# Patient Record
Sex: Female | Born: 1945
Health system: Southern US, Community
[De-identification: ages and names within clinical notes are randomized; demographics above are authoritative.]

## PROBLEM LIST (undated history)

## (undated) DIAGNOSIS — K7460 Unspecified cirrhosis of liver: Secondary | ICD-10-CM

## (undated) DIAGNOSIS — R601 Generalized edema: Secondary | ICD-10-CM

## (undated) DIAGNOSIS — J45909 Unspecified asthma, uncomplicated: Secondary | ICD-10-CM

## (undated) DIAGNOSIS — K7200 Acute and subacute hepatic failure without coma: Secondary | ICD-10-CM

## (undated) DIAGNOSIS — C52 Malignant neoplasm of vagina: Secondary | ICD-10-CM

## (undated) DIAGNOSIS — K922 Gastrointestinal hemorrhage, unspecified: Secondary | ICD-10-CM

## (undated) DIAGNOSIS — F32A Depression, unspecified: Secondary | ICD-10-CM

## (undated) DIAGNOSIS — R188 Other ascites: Secondary | ICD-10-CM

## (undated) DIAGNOSIS — T7840XA Allergy, unspecified, initial encounter: Secondary | ICD-10-CM

## (undated) DIAGNOSIS — I10 Essential (primary) hypertension: Secondary | ICD-10-CM

## (undated) DIAGNOSIS — D696 Thrombocytopenia, unspecified: Secondary | ICD-10-CM

## (undated) DIAGNOSIS — K921 Melena: Secondary | ICD-10-CM

## (undated) DIAGNOSIS — F419 Anxiety disorder, unspecified: Secondary | ICD-10-CM

## (undated) DIAGNOSIS — E785 Hyperlipidemia, unspecified: Secondary | ICD-10-CM

## (undated) DIAGNOSIS — L309 Dermatitis, unspecified: Secondary | ICD-10-CM

## (undated) DIAGNOSIS — E119 Type 2 diabetes mellitus without complications: Secondary | ICD-10-CM

## (undated) DIAGNOSIS — M1990 Unspecified osteoarthritis, unspecified site: Secondary | ICD-10-CM

## (undated) DIAGNOSIS — F329 Major depressive disorder, single episode, unspecified: Secondary | ICD-10-CM

## (undated) DIAGNOSIS — R51 Headache: Secondary | ICD-10-CM

## (undated) DIAGNOSIS — E041 Nontoxic single thyroid nodule: Secondary | ICD-10-CM

## (undated) DIAGNOSIS — M6281 Muscle weakness (generalized): Secondary | ICD-10-CM

## (undated) DIAGNOSIS — C541 Malignant neoplasm of endometrium: Secondary | ICD-10-CM

## (undated) DIAGNOSIS — R569 Unspecified convulsions: Secondary | ICD-10-CM

## (undated) HISTORY — DX: Malignant neoplasm of endometrium: C54.1

## (undated) HISTORY — DX: Type 2 diabetes mellitus without complications: E11.9

## (undated) HISTORY — DX: Nontoxic single thyroid nodule: E04.1

## (undated) HISTORY — DX: Allergy, unspecified, initial encounter: T78.40XA

## (undated) HISTORY — DX: Hyperlipidemia, unspecified: E78.5

## (undated) HISTORY — DX: Anxiety disorder, unspecified: F41.9

## (undated) HISTORY — PX: OTHER SURGICAL HISTORY: SHX169

## (undated) HISTORY — DX: Malignant neoplasm of vagina: C52

## (undated) HISTORY — DX: Essential (primary) hypertension: I10

## (undated) HISTORY — PX: DILATION AND CURETTAGE OF UTERUS: SHX78

## (undated) SURGERY — ESOPHAGOGASTRODUODENOSCOPY (EGD) WITH PROPOFOL
Anesthesia: Monitor Anesthesia Care

---
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
## 1994-03-23 HISTORY — PX: TOTAL ABDOMINAL HYSTERECTOMY: SHX209

## 2003-03-24 DIAGNOSIS — C541 Malignant neoplasm of endometrium: Secondary | ICD-10-CM

## 2003-03-24 HISTORY — DX: Malignant neoplasm of endometrium: C54.1

## 2007-03-24 DIAGNOSIS — K921 Melena: Secondary | ICD-10-CM

## 2007-03-24 HISTORY — DX: Melena: K92.1

## 2007-06-22 HISTORY — PX: COLONOSCOPY: SHX174

## 2008-03-29 ENCOUNTER — Encounter: Payer: Self-pay | Admitting: Gynecologic Oncology

## 2008-03-29 ENCOUNTER — Other Ambulatory Visit: Admission: RE | Admit: 2008-03-29 | Discharge: 2008-03-29 | Payer: Self-pay | Admitting: Gynecologic Oncology

## 2008-03-29 ENCOUNTER — Ambulatory Visit: Admission: RE | Admit: 2008-03-29 | Discharge: 2008-03-29 | Payer: Self-pay | Admitting: Gynecologic Oncology

## 2008-08-06 ENCOUNTER — Ambulatory Visit (HOSPITAL_COMMUNITY): Admission: RE | Admit: 2008-08-06 | Discharge: 2008-08-06 | Payer: Self-pay | Admitting: Family Medicine

## 2008-09-18 ENCOUNTER — Ambulatory Visit: Admission: RE | Admit: 2008-09-18 | Discharge: 2008-09-18 | Payer: Self-pay | Admitting: Gynecologic Oncology

## 2008-09-18 ENCOUNTER — Other Ambulatory Visit: Admission: RE | Admit: 2008-09-18 | Discharge: 2008-09-18 | Payer: Self-pay | Admitting: Gynecologic Oncology

## 2008-09-18 ENCOUNTER — Encounter: Payer: Self-pay | Admitting: Gynecologic Oncology

## 2009-11-11 ENCOUNTER — Ambulatory Visit (HOSPITAL_COMMUNITY): Admission: RE | Admit: 2009-11-11 | Discharge: 2009-11-11 | Payer: Self-pay | Admitting: Obstetrics & Gynecology

## 2010-08-05 NOTE — Consult Note (Signed)
Brittany Russo, Brittany Russo                ACCOUNT NO.:  [id number]   MEDICAL RECORD NO.:  [id number]          PATIENT TYPE:  OUT   LOCATION:  GYN                          FACILITY:  [facility]   PHYSICIAN:  Janie Morning, MD     DATE OF BIRTH:  1945-06-14   DATE OF CONSULTATION:  03/29/2008  DATE OF DISCHARGE:                                 CONSULTATION   REASON FOR CONSULTATION:  Followup for recurrent endometrial cancer.   HISTORY OF PRESENT ILLNESS:  This is a 65 year old who in 1966 underwent  a hysterectomy, bilateral salpingo-oophorectomy for presumed  endometriosis.  However, the pathology report was noted to have an area  consistent of grade 1 endometrial cancer.  The patient presented 6 years  later with vaginal bleeding and at that time recurrence was identified.  She was treated with syad implants and that brachytherapy has been  associated with rectal urgency, vaginal atrophy and occasional Pap  smears with changes consistent with radiation changes.  There has been  no recurrence since.   PAST MEDICAL HISTORY:  Hypertension, hypercholesterolemia, diabetes  mellitus, intermittent depression, colitis.   PAST SURGICAL HISTORY:  As noted above.  In addition, benign thyroid  nodule in 2005.   SOCIAL HISTORY:  Brittany Russo has 4 children who are alive and well.  She  denies any tobacco use.  She is married and recently relocated from [place]  to [place], [place].   SCREENING HISTORY:  Mammogram February 2009 within normal limits.  Pap  April 2009 within normal limits.  Colonoscopy February 2009 within  normal limits.   REVIEW OF SYSTEMS:  Notable for rectal urgency.  No nausea or vomiting.  There has been recent weight gain.  She reports occasional vaginal  spotting.   PHYSICAL EXAMINATION:  GENERAL:  A well-developed, obese female in no  acute distress.  VITAL SIGNS:  Weight 191 pounds, height 5 feet 2 inches, blood pressure  159/85, pulse of 76.  CHEST:  Clear to  auscultation.  LYMPH NODE SURVEY:  No axillary, cervical or inguinal lymphadenopathy.  BACK:  No CVA tenderness.  ABDOMEN:  Soft, obese, nontender.  Well-healed Pfannenstiel incision is  present.  PELVIC:  Normal external genitalia, Bartholin's, urethra and Skene's.  The vagina was notably atrophic.  No gross lesions were appreciated.  There were some filmy adhesions in the vaginal apex.  RECTAL:  Notable for external hemorrhoids.   IMPRESSION:  Recurrent endometrial cancer.  Ms. Dargan has been without  evidence of disease for almost 5 years.  She informs me in 2006 per Dr.  Con Memos advice she did not pursue any additional CT scan imaging for  surveillance.  She does report intermittent chest x-rays.  At this time  the plan is to repeat her Pap test in 6 months as was per her routine  with Dr. Janey Greaser.      Janie Morning, MD  Electronically Signed     WB/MEDQ  D:  03/29/2008  T:  03/29/2008  Job:  [id number]   cc:   [place]  [address]  [address]  [place]  [address]   Caswell Corwin, [place], [place] [address]   [facility] [place]  Fax: [phone]

## 2010-08-05 NOTE — Consult Note (Signed)
Brittany Russo, Brittany Russo                ACCOUNT NO.:  [id number]   MEDICAL RECORD NO.:  [id number]          PATIENT TYPE:  OUT   LOCATION:  GYN                          [facility]ACILITY[facility]  [facility]   PHYSICIAN:  Janie Morning, MD     DATE OF BIRTH:  08/30/1945   DATE OF CONSULTATION:  09/18/2008  DATE OF DISCHARGE:                                 CONSULTATION   REASON FOR VISIT:  Surveillance for endometrial cancer.   HISTORY OF PRESENT ILLNESS:  This is a 65 year old who underwent a total  abdominal hysterectomy, bilateral salpingo-oophorectomy for  endometriosis in 1996.  The pathology report, however, was consistent  with grade I endometrial cancer.  She presented in 2003 with vaginal  bleeding, and at that time recurrence was noted at the vaginal cuff.  She was treated with Hart Robinsons.  She was treated with [facility] implants  and external beam radiation therapy.  Treatment was completed in March  of 2004.  Since that time, she has had periodic issues with radiation  proctitis, intermittent diarrhea, occasional vaginal bleeding.  She also  reports a history of rectal urgency, vaginal atrophy, and there is a  history of occasional Pap smear changes consistent with radiation  changes, and no evidence of disease since her definitive treatment.   PAST MEDICAL HISTORY:  1. Hypertension.  2. Hypercholesterolemia.  3. Diabetes mellitus.  4. Intermittent depression.  5. Radiation colitis.   PAST SURGICAL HISTORY:  As noted above.   SOCIAL HISTORY:  Brittany Russo has 4 children who are alive and well.  She  is married and relocated from [place] to [place], [place].   SCREENING HISTORY:  A mammogram in February of 2009 was within normal  limits.  No DEXA scan for several years.  Pap on March 29, 2008, which  was negative.  A colonoscopy in February of 2009 within normal limits.   REVIEW OF SYSTEMS:  Rectal urgency.  No nausea or vomiting.  Occasional  loss of urine.  Occasional vaginal spotting  associated with the use of a  dilator.  Occasional diarrhea.  Otherwise, review of systems is  negative.   PHYSICAL EXAMINATION:  VITAL SIGNS:  Weight 188 pounds, blood pressure  132/60.  CHEST:  Clear to auscultation.  BACK:  No CVA tenderness.  LYMPH NODE SURVEY:  No axillary, cervical or inguinal lymphadenopathy.  ABDOMEN:  Soft, obese, nontender.  A Pfannenstiel incision is noted.  PELVIC:  Normal external genitalia, Bartholin's gland, Skene's gland,  and urethra.  The vagina is atrophic and friable with distention of the  pelvis.  RECTAL:  External hemorrhoids.  No masses.   IMPRESSION:  This is a 65 year old who is no evidence of disease (NED)  from her recurrent endometrial cancer.  She is doing very well, and we  had a discussion about transferring her care to that of well woman care.  For that, I have highly suggested that she follow up with Dr. Lahoma Crocker on an annual basis with the first visit next June.  The  patient agrees with this plan.  Contact information was provided.  Janie Morning, MD  Electronically Signed     WB/MEDQ  D:  09/18/2008  T:  09/18/2008  Job:  [id number]   cc:   Caswell Corwin, R.N.  [address]  [place], [place] [address]   Agnes Lawrence, M.D.  Fax: [phone]

## 2013-01-26 ENCOUNTER — Encounter: Payer: Self-pay | Admitting: Obstetrics & Gynecology

## 2013-03-06 ENCOUNTER — Ambulatory Visit (INDEPENDENT_AMBULATORY_CARE_PROVIDER_SITE_OTHER): Payer: Medicare Other | Admitting: Obstetrics & Gynecology

## 2013-03-06 ENCOUNTER — Encounter: Payer: Self-pay | Admitting: Obstetrics & Gynecology

## 2013-03-06 VITALS — BP 120/72 | HR 61 | Temp 98.0°F | Ht 62.0 in | Wt 180.0 lb

## 2013-03-06 DIAGNOSIS — R82998 Other abnormal findings in urine: Secondary | ICD-10-CM | POA: Diagnosis not present

## 2013-03-06 DIAGNOSIS — Z01419 Encounter for gynecological examination (general) (routine) without abnormal findings: Secondary | ICD-10-CM

## 2013-03-06 DIAGNOSIS — Z0000 Encounter for general adult medical examination without abnormal findings: Secondary | ICD-10-CM

## 2013-03-06 DIAGNOSIS — Z124 Encounter for screening for malignant neoplasm of cervix: Secondary | ICD-10-CM

## 2013-03-06 DIAGNOSIS — R8290 Unspecified abnormal findings in urine: Secondary | ICD-10-CM

## 2013-03-06 LAB — POCT URINALYSIS DIPSTICK
Bilirubin, UA: NEGATIVE
Glucose, UA: NEGATIVE
Ketones, UA: NEGATIVE
Nitrite, UA: NEGATIVE
Protein, UA: NEGATIVE
Spec Grav, UA: <=1.005
Urobilinogen, UA: NEGATIVE
pH, UA: 6.5

## 2013-03-06 NOTE — Progress Notes (Signed)
Subjective:     Brittany Russo is a 67 y.o. female here for a routine exam.  Current complaints: pain in pelvic area, reports lump in right breast, mammogram scheduled for tomorrow.  Personal health questionnaire reviewed: yes.   Gynecologic History No LMP recorded. Patient is postmenopausal. Contraception: post menopausal status Last Pap:2012. Results were: normal Last mammogram: 03/2010. Results were: abnormal, needs follow up  Obstetric History OB History  No data available     The following portions of the patient's history were reviewed and updated as appropriate: allergies, current medications, past family history, past medical history, past social history, past surgical history and problem list.  Review of Systems Pertinent items are noted in HPI.    Objective:      General appearance: alert Breasts: normal appearance, no masses or tenderness Abdomen: soft, non-tender; bowel sounds normal; no masses,  no organomegaly Pelvic: cervix normal in appearance, external genitalia normal, no adnexal masses or tenderness, uterus normal size, shape, and consistency and vagina normal without discharge       Assessment:    Healthy female exam.    Plan:   Return prn

## 2013-03-07 DIAGNOSIS — R928 Other abnormal and inconclusive findings on diagnostic imaging of breast: Secondary | ICD-10-CM | POA: Diagnosis not present

## 2013-03-07 LAB — URINE CULTURE
Colony Count: NO GROWTH
Organism ID, Bacteria: NO GROWTH

## 2013-03-07 NOTE — Patient Instructions (Signed)

## 2013-03-09 LAB — PAP IG AND HPV HIGH-RISK: HPV DNA High Risk: NOT DETECTED

## 2013-03-24 ENCOUNTER — Other Ambulatory Visit: Payer: Self-pay | Admitting: *Deleted

## 2013-03-24 DIAGNOSIS — N72 Inflammatory disease of cervix uteri: Secondary | ICD-10-CM

## 2013-03-30 ENCOUNTER — Other Ambulatory Visit: Payer: Self-pay | Admitting: *Deleted

## 2013-03-30 DIAGNOSIS — N72 Inflammatory disease of cervix uteri: Secondary | ICD-10-CM

## 2013-03-30 MED ORDER — DOXYCYCLINE HYCLATE 50 MG PO CAPS: 100.0000 mg | ORAL_CAPSULE | Freq: Two times a day (BID) | ORAL | Status: DC

## 2013-04-13 DIAGNOSIS — IMO0001 Reserved for inherently not codable concepts without codable children: Secondary | ICD-10-CM | POA: Diagnosis not present

## 2013-04-13 DIAGNOSIS — J019 Acute sinusitis, unspecified: Secondary | ICD-10-CM | POA: Diagnosis not present

## 2013-04-13 DIAGNOSIS — I10 Essential (primary) hypertension: Secondary | ICD-10-CM | POA: Diagnosis not present

## 2013-04-13 DIAGNOSIS — E782 Mixed hyperlipidemia: Secondary | ICD-10-CM | POA: Diagnosis not present

## 2013-04-13 DIAGNOSIS — Z1331 Encounter for screening for depression: Secondary | ICD-10-CM | POA: Diagnosis not present

## 2013-05-04 DIAGNOSIS — IMO0001 Reserved for inherently not codable concepts without codable children: Secondary | ICD-10-CM | POA: Diagnosis not present

## 2013-05-04 DIAGNOSIS — E876 Hypokalemia: Secondary | ICD-10-CM | POA: Diagnosis not present

## 2013-06-06 ENCOUNTER — Encounter: Payer: Self-pay | Admitting: Obstetrics & Gynecology

## 2013-06-29 DIAGNOSIS — H8309 Labyrinthitis, unspecified ear: Secondary | ICD-10-CM | POA: Diagnosis not present

## 2013-07-10 ENCOUNTER — Ambulatory Visit: Payer: Medicare Other | Admitting: Obstetrics & Gynecology

## 2013-07-20 DIAGNOSIS — Z23 Encounter for immunization: Secondary | ICD-10-CM | POA: Diagnosis not present

## 2013-07-20 DIAGNOSIS — Z136 Encounter for screening for cardiovascular disorders: Secondary | ICD-10-CM | POA: Diagnosis not present

## 2013-07-20 DIAGNOSIS — Z0000 Encounter for general adult medical examination without abnormal findings: Secondary | ICD-10-CM | POA: Diagnosis not present

## 2013-07-20 DIAGNOSIS — E782 Mixed hyperlipidemia: Secondary | ICD-10-CM | POA: Diagnosis not present

## 2013-07-20 DIAGNOSIS — IMO0001 Reserved for inherently not codable concepts without codable children: Secondary | ICD-10-CM | POA: Diagnosis not present

## 2013-08-04 DIAGNOSIS — R197 Diarrhea, unspecified: Secondary | ICD-10-CM | POA: Diagnosis not present

## 2013-08-04 DIAGNOSIS — Z1211 Encounter for screening for malignant neoplasm of colon: Secondary | ICD-10-CM | POA: Diagnosis not present

## 2013-08-29 DIAGNOSIS — E119 Type 2 diabetes mellitus without complications: Secondary | ICD-10-CM | POA: Diagnosis not present

## 2013-08-29 DIAGNOSIS — H2510 Age-related nuclear cataract, unspecified eye: Secondary | ICD-10-CM | POA: Diagnosis not present

## 2013-09-18 DIAGNOSIS — Z780 Asymptomatic menopausal state: Secondary | ICD-10-CM | POA: Diagnosis not present

## 2013-10-31 DIAGNOSIS — IMO0001 Reserved for inherently not codable concepts without codable children: Secondary | ICD-10-CM | POA: Diagnosis not present

## 2013-10-31 DIAGNOSIS — E871 Hypo-osmolality and hyponatremia: Secondary | ICD-10-CM | POA: Diagnosis not present

## 2013-10-31 DIAGNOSIS — R059 Cough, unspecified: Secondary | ICD-10-CM | POA: Diagnosis not present

## 2013-10-31 DIAGNOSIS — R05 Cough: Secondary | ICD-10-CM | POA: Diagnosis not present

## 2013-10-31 DIAGNOSIS — R7989 Other specified abnormal findings of blood chemistry: Secondary | ICD-10-CM | POA: Diagnosis not present

## 2013-10-31 DIAGNOSIS — R062 Wheezing: Secondary | ICD-10-CM | POA: Diagnosis not present

## 2013-11-16 DIAGNOSIS — I10 Essential (primary) hypertension: Secondary | ICD-10-CM | POA: Diagnosis not present

## 2013-12-23 DIAGNOSIS — Z23 Encounter for immunization: Secondary | ICD-10-CM | POA: Diagnosis not present

## 2014-02-08 DIAGNOSIS — E1165 Type 2 diabetes mellitus with hyperglycemia: Secondary | ICD-10-CM | POA: Diagnosis not present

## 2014-02-08 DIAGNOSIS — R319 Hematuria, unspecified: Secondary | ICD-10-CM | POA: Diagnosis not present

## 2014-02-08 DIAGNOSIS — N390 Urinary tract infection, site not specified: Secondary | ICD-10-CM | POA: Diagnosis not present

## 2014-03-08 ENCOUNTER — Ambulatory Visit: Payer: Medicare Other | Admitting: Obstetrics & Gynecology

## 2014-03-19 ENCOUNTER — Encounter: Payer: Self-pay | Admitting: *Deleted

## 2014-03-20 ENCOUNTER — Encounter: Payer: Self-pay | Admitting: Obstetrics & Gynecology

## 2014-03-23 HISTORY — PX: ESOPHAGOGASTRODUODENOSCOPY: SHX1529

## 2014-03-30 DIAGNOSIS — R928 Other abnormal and inconclusive findings on diagnostic imaging of breast: Secondary | ICD-10-CM | POA: Diagnosis not present

## 2014-03-30 DIAGNOSIS — Z1231 Encounter for screening mammogram for malignant neoplasm of breast: Secondary | ICD-10-CM | POA: Diagnosis not present

## 2014-04-11 DIAGNOSIS — N6002 Solitary cyst of left breast: Secondary | ICD-10-CM | POA: Diagnosis not present

## 2014-04-30 DIAGNOSIS — J019 Acute sinusitis, unspecified: Secondary | ICD-10-CM | POA: Diagnosis not present

## 2014-05-24 ENCOUNTER — Ambulatory Visit: Payer: Medicare Other | Admitting: Obstetrics & Gynecology

## 2014-05-24 DIAGNOSIS — J0101 Acute recurrent maxillary sinusitis: Secondary | ICD-10-CM | POA: Diagnosis not present

## 2014-05-24 DIAGNOSIS — J45901 Unspecified asthma with (acute) exacerbation: Secondary | ICD-10-CM | POA: Diagnosis not present

## 2014-06-26 DIAGNOSIS — E782 Mixed hyperlipidemia: Secondary | ICD-10-CM | POA: Diagnosis not present

## 2014-06-26 DIAGNOSIS — E1165 Type 2 diabetes mellitus with hyperglycemia: Secondary | ICD-10-CM | POA: Diagnosis not present

## 2014-06-26 DIAGNOSIS — I10 Essential (primary) hypertension: Secondary | ICD-10-CM | POA: Diagnosis not present

## 2014-08-21 DIAGNOSIS — J309 Allergic rhinitis, unspecified: Secondary | ICD-10-CM | POA: Diagnosis not present

## 2014-08-21 DIAGNOSIS — J45901 Unspecified asthma with (acute) exacerbation: Secondary | ICD-10-CM | POA: Diagnosis not present

## 2014-09-15 DIAGNOSIS — E669 Obesity, unspecified: Secondary | ICD-10-CM | POA: Diagnosis present

## 2014-09-15 DIAGNOSIS — R935 Abnormal findings on diagnostic imaging of other abdominal regions, including retroperitoneum: Secondary | ICD-10-CM | POA: Diagnosis not present

## 2014-09-15 DIAGNOSIS — Z8589 Personal history of malignant neoplasm of other organs and systems: Secondary | ICD-10-CM | POA: Diagnosis not present

## 2014-09-15 DIAGNOSIS — F329 Major depressive disorder, single episode, unspecified: Secondary | ICD-10-CM | POA: Diagnosis present

## 2014-09-15 DIAGNOSIS — R799 Abnormal finding of blood chemistry, unspecified: Secondary | ICD-10-CM | POA: Diagnosis not present

## 2014-09-15 DIAGNOSIS — D696 Thrombocytopenia, unspecified: Secondary | ICD-10-CM | POA: Diagnosis present

## 2014-09-15 DIAGNOSIS — E871 Hypo-osmolality and hyponatremia: Secondary | ICD-10-CM | POA: Diagnosis not present

## 2014-09-15 DIAGNOSIS — Z6832 Body mass index (BMI) 32.0-32.9, adult: Secondary | ICD-10-CM | POA: Diagnosis not present

## 2014-09-15 DIAGNOSIS — R1011 Right upper quadrant pain: Secondary | ICD-10-CM | POA: Diagnosis present

## 2014-09-15 DIAGNOSIS — E119 Type 2 diabetes mellitus without complications: Secondary | ICD-10-CM | POA: Diagnosis present

## 2014-09-15 DIAGNOSIS — E878 Other disorders of electrolyte and fluid balance, not elsewhere classified: Secondary | ICD-10-CM | POA: Diagnosis not present

## 2014-09-15 DIAGNOSIS — E780 Pure hypercholesterolemia: Secondary | ICD-10-CM | POA: Diagnosis present

## 2014-09-15 DIAGNOSIS — R161 Splenomegaly, not elsewhere classified: Secondary | ICD-10-CM | POA: Diagnosis not present

## 2014-09-15 DIAGNOSIS — I10 Essential (primary) hypertension: Secondary | ICD-10-CM | POA: Diagnosis present

## 2014-09-15 DIAGNOSIS — E870 Hyperosmolality and hypernatremia: Secondary | ICD-10-CM | POA: Diagnosis not present

## 2014-09-15 DIAGNOSIS — K828 Other specified diseases of gallbladder: Secondary | ICD-10-CM | POA: Diagnosis not present

## 2014-09-15 DIAGNOSIS — K7460 Unspecified cirrhosis of liver: Secondary | ICD-10-CM | POA: Diagnosis present

## 2014-09-15 DIAGNOSIS — Z90710 Acquired absence of both cervix and uterus: Secondary | ICD-10-CM | POA: Diagnosis not present

## 2014-09-15 DIAGNOSIS — K589 Irritable bowel syndrome without diarrhea: Secondary | ICD-10-CM | POA: Diagnosis present

## 2014-09-15 DIAGNOSIS — R1010 Upper abdominal pain, unspecified: Secondary | ICD-10-CM | POA: Diagnosis not present

## 2014-09-15 DIAGNOSIS — E876 Hypokalemia: Secondary | ICD-10-CM | POA: Diagnosis not present

## 2014-09-15 DIAGNOSIS — K838 Other specified diseases of biliary tract: Secondary | ICD-10-CM | POA: Diagnosis not present

## 2014-09-15 DIAGNOSIS — Z794 Long term (current) use of insulin: Secondary | ICD-10-CM | POA: Diagnosis not present

## 2014-09-15 DIAGNOSIS — Z8542 Personal history of malignant neoplasm of other parts of uterus: Secondary | ICD-10-CM | POA: Diagnosis not present

## 2014-09-15 DIAGNOSIS — R739 Hyperglycemia, unspecified: Secondary | ICD-10-CM | POA: Diagnosis not present

## 2014-09-15 DIAGNOSIS — K7469 Other cirrhosis of liver: Secondary | ICD-10-CM | POA: Diagnosis not present

## 2014-09-15 DIAGNOSIS — K766 Portal hypertension: Secondary | ICD-10-CM | POA: Diagnosis not present

## 2014-09-15 DIAGNOSIS — R109 Unspecified abdominal pain: Secondary | ICD-10-CM | POA: Diagnosis not present

## 2014-09-15 DIAGNOSIS — Z79899 Other long term (current) drug therapy: Secondary | ICD-10-CM | POA: Diagnosis not present

## 2014-09-15 DIAGNOSIS — E1165 Type 2 diabetes mellitus with hyperglycemia: Secondary | ICD-10-CM | POA: Diagnosis not present

## 2014-09-19 DIAGNOSIS — R748 Abnormal levels of other serum enzymes: Secondary | ICD-10-CM | POA: Diagnosis not present

## 2014-09-19 DIAGNOSIS — R109 Unspecified abdominal pain: Secondary | ICD-10-CM | POA: Diagnosis not present

## 2014-09-19 DIAGNOSIS — K7460 Unspecified cirrhosis of liver: Secondary | ICD-10-CM | POA: Diagnosis not present

## 2014-10-09 DIAGNOSIS — K7460 Unspecified cirrhosis of liver: Secondary | ICD-10-CM | POA: Diagnosis not present

## 2014-10-18 DIAGNOSIS — E1165 Type 2 diabetes mellitus with hyperglycemia: Secondary | ICD-10-CM | POA: Diagnosis not present

## 2014-10-18 DIAGNOSIS — I10 Essential (primary) hypertension: Secondary | ICD-10-CM | POA: Diagnosis not present

## 2014-10-19 DIAGNOSIS — K2970 Gastritis, unspecified, without bleeding: Secondary | ICD-10-CM | POA: Diagnosis not present

## 2014-10-19 DIAGNOSIS — E119 Type 2 diabetes mellitus without complications: Secondary | ICD-10-CM | POA: Diagnosis not present

## 2014-10-19 DIAGNOSIS — Z8249 Family history of ischemic heart disease and other diseases of the circulatory system: Secondary | ICD-10-CM | POA: Diagnosis not present

## 2014-10-19 DIAGNOSIS — K589 Irritable bowel syndrome without diarrhea: Secondary | ICD-10-CM | POA: Diagnosis not present

## 2014-10-19 DIAGNOSIS — J45909 Unspecified asthma, uncomplicated: Secondary | ICD-10-CM | POA: Diagnosis not present

## 2014-10-19 DIAGNOSIS — Z833 Family history of diabetes mellitus: Secondary | ICD-10-CM | POA: Diagnosis not present

## 2014-10-19 DIAGNOSIS — E785 Hyperlipidemia, unspecified: Secondary | ICD-10-CM | POA: Diagnosis not present

## 2014-10-19 DIAGNOSIS — K449 Diaphragmatic hernia without obstruction or gangrene: Secondary | ICD-10-CM | POA: Diagnosis not present

## 2014-10-19 DIAGNOSIS — M1990 Unspecified osteoarthritis, unspecified site: Secondary | ICD-10-CM | POA: Diagnosis not present

## 2014-10-19 DIAGNOSIS — I10 Essential (primary) hypertension: Secondary | ICD-10-CM | POA: Diagnosis not present

## 2014-10-19 DIAGNOSIS — K219 Gastro-esophageal reflux disease without esophagitis: Secondary | ICD-10-CM | POA: Diagnosis not present

## 2014-10-19 DIAGNOSIS — K7460 Unspecified cirrhosis of liver: Secondary | ICD-10-CM | POA: Diagnosis not present

## 2014-10-19 DIAGNOSIS — Z13818 Encounter for screening for other digestive system disorders: Secondary | ICD-10-CM | POA: Diagnosis not present

## 2014-10-19 DIAGNOSIS — F419 Anxiety disorder, unspecified: Secondary | ICD-10-CM | POA: Diagnosis not present

## 2014-10-19 DIAGNOSIS — Z8542 Personal history of malignant neoplasm of other parts of uterus: Secondary | ICD-10-CM | POA: Diagnosis not present

## 2014-10-19 DIAGNOSIS — Z79899 Other long term (current) drug therapy: Secondary | ICD-10-CM | POA: Diagnosis not present

## 2014-10-19 DIAGNOSIS — F329 Major depressive disorder, single episode, unspecified: Secondary | ICD-10-CM | POA: Diagnosis not present

## 2014-11-30 DIAGNOSIS — K7469 Other cirrhosis of liver: Secondary | ICD-10-CM | POA: Diagnosis not present

## 2014-11-30 DIAGNOSIS — E1165 Type 2 diabetes mellitus with hyperglycemia: Secondary | ICD-10-CM | POA: Diagnosis not present

## 2014-11-30 DIAGNOSIS — K219 Gastro-esophageal reflux disease without esophagitis: Secondary | ICD-10-CM | POA: Diagnosis not present

## 2014-11-30 DIAGNOSIS — I10 Essential (primary) hypertension: Secondary | ICD-10-CM | POA: Diagnosis not present

## 2014-11-30 DIAGNOSIS — E782 Mixed hyperlipidemia: Secondary | ICD-10-CM | POA: Diagnosis not present

## 2014-11-30 DIAGNOSIS — F329 Major depressive disorder, single episode, unspecified: Secondary | ICD-10-CM | POA: Diagnosis not present

## 2014-11-30 DIAGNOSIS — F419 Anxiety disorder, unspecified: Secondary | ICD-10-CM | POA: Diagnosis not present

## 2015-01-21 DIAGNOSIS — Z23 Encounter for immunization: Secondary | ICD-10-CM | POA: Diagnosis not present

## 2015-02-15 DIAGNOSIS — K219 Gastro-esophageal reflux disease without esophagitis: Secondary | ICD-10-CM | POA: Diagnosis not present

## 2015-02-15 DIAGNOSIS — I10 Essential (primary) hypertension: Secondary | ICD-10-CM | POA: Diagnosis not present

## 2015-02-15 DIAGNOSIS — E782 Mixed hyperlipidemia: Secondary | ICD-10-CM | POA: Diagnosis not present

## 2015-02-15 DIAGNOSIS — E1165 Type 2 diabetes mellitus with hyperglycemia: Secondary | ICD-10-CM | POA: Diagnosis not present

## 2015-02-15 DIAGNOSIS — F419 Anxiety disorder, unspecified: Secondary | ICD-10-CM | POA: Diagnosis not present

## 2015-02-15 DIAGNOSIS — K7469 Other cirrhosis of liver: Secondary | ICD-10-CM | POA: Diagnosis not present

## 2015-02-22 DIAGNOSIS — F419 Anxiety disorder, unspecified: Secondary | ICD-10-CM | POA: Diagnosis not present

## 2015-02-22 DIAGNOSIS — E782 Mixed hyperlipidemia: Secondary | ICD-10-CM | POA: Diagnosis not present

## 2015-02-22 DIAGNOSIS — K7469 Other cirrhosis of liver: Secondary | ICD-10-CM | POA: Diagnosis not present

## 2015-02-22 DIAGNOSIS — D696 Thrombocytopenia, unspecified: Secondary | ICD-10-CM | POA: Diagnosis not present

## 2015-02-22 DIAGNOSIS — K219 Gastro-esophageal reflux disease without esophagitis: Secondary | ICD-10-CM | POA: Diagnosis not present

## 2015-02-22 DIAGNOSIS — F329 Major depressive disorder, single episode, unspecified: Secondary | ICD-10-CM | POA: Diagnosis not present

## 2015-02-22 DIAGNOSIS — R300 Dysuria: Secondary | ICD-10-CM | POA: Diagnosis not present

## 2015-02-22 DIAGNOSIS — E1165 Type 2 diabetes mellitus with hyperglycemia: Secondary | ICD-10-CM | POA: Diagnosis not present

## 2015-02-22 DIAGNOSIS — I10 Essential (primary) hypertension: Secondary | ICD-10-CM | POA: Diagnosis not present

## 2015-03-04 DIAGNOSIS — I10 Essential (primary) hypertension: Secondary | ICD-10-CM | POA: Diagnosis not present

## 2015-03-04 DIAGNOSIS — E1165 Type 2 diabetes mellitus with hyperglycemia: Secondary | ICD-10-CM | POA: Diagnosis not present

## 2015-03-04 DIAGNOSIS — E782 Mixed hyperlipidemia: Secondary | ICD-10-CM | POA: Diagnosis not present

## 2015-03-11 DIAGNOSIS — R109 Unspecified abdominal pain: Secondary | ICD-10-CM | POA: Diagnosis not present

## 2015-03-11 DIAGNOSIS — K625 Hemorrhage of anus and rectum: Secondary | ICD-10-CM | POA: Diagnosis not present

## 2015-03-11 DIAGNOSIS — R748 Abnormal levels of other serum enzymes: Secondary | ICD-10-CM | POA: Diagnosis not present

## 2015-03-11 DIAGNOSIS — K7460 Unspecified cirrhosis of liver: Secondary | ICD-10-CM | POA: Diagnosis not present

## 2015-03-15 DIAGNOSIS — Z833 Family history of diabetes mellitus: Secondary | ICD-10-CM | POA: Diagnosis not present

## 2015-03-15 DIAGNOSIS — I781 Nevus, non-neoplastic: Secondary | ICD-10-CM | POA: Diagnosis not present

## 2015-03-15 DIAGNOSIS — Z79899 Other long term (current) drug therapy: Secondary | ICD-10-CM | POA: Diagnosis not present

## 2015-03-15 DIAGNOSIS — E119 Type 2 diabetes mellitus without complications: Secondary | ICD-10-CM | POA: Diagnosis not present

## 2015-03-15 DIAGNOSIS — I10 Essential (primary) hypertension: Secondary | ICD-10-CM | POA: Diagnosis not present

## 2015-03-15 DIAGNOSIS — K922 Gastrointestinal hemorrhage, unspecified: Secondary | ICD-10-CM | POA: Diagnosis not present

## 2015-03-15 DIAGNOSIS — K219 Gastro-esophageal reflux disease without esophagitis: Secondary | ICD-10-CM | POA: Diagnosis not present

## 2015-03-15 DIAGNOSIS — F419 Anxiety disorder, unspecified: Secondary | ICD-10-CM | POA: Diagnosis not present

## 2015-03-15 DIAGNOSIS — K624 Stenosis of anus and rectum: Secondary | ICD-10-CM | POA: Diagnosis not present

## 2015-03-15 DIAGNOSIS — E785 Hyperlipidemia, unspecified: Secondary | ICD-10-CM | POA: Diagnosis not present

## 2015-03-15 DIAGNOSIS — R109 Unspecified abdominal pain: Secondary | ICD-10-CM | POA: Diagnosis not present

## 2015-03-15 DIAGNOSIS — D125 Benign neoplasm of sigmoid colon: Secondary | ICD-10-CM | POA: Diagnosis not present

## 2015-03-15 DIAGNOSIS — K7460 Unspecified cirrhosis of liver: Secondary | ICD-10-CM | POA: Diagnosis not present

## 2015-03-15 DIAGNOSIS — J45909 Unspecified asthma, uncomplicated: Secondary | ICD-10-CM | POA: Diagnosis not present

## 2015-03-15 DIAGNOSIS — K635 Polyp of colon: Secondary | ICD-10-CM | POA: Diagnosis not present

## 2015-03-15 DIAGNOSIS — K589 Irritable bowel syndrome without diarrhea: Secondary | ICD-10-CM | POA: Diagnosis not present

## 2015-03-15 DIAGNOSIS — K625 Hemorrhage of anus and rectum: Secondary | ICD-10-CM | POA: Diagnosis not present

## 2015-03-15 DIAGNOSIS — Z8249 Family history of ischemic heart disease and other diseases of the circulatory system: Secondary | ICD-10-CM | POA: Diagnosis not present

## 2015-03-15 DIAGNOSIS — M1990 Unspecified osteoarthritis, unspecified site: Secondary | ICD-10-CM | POA: Diagnosis not present

## 2015-03-15 DIAGNOSIS — Z886 Allergy status to analgesic agent status: Secondary | ICD-10-CM | POA: Diagnosis not present

## 2015-03-15 DIAGNOSIS — Z8542 Personal history of malignant neoplasm of other parts of uterus: Secondary | ICD-10-CM | POA: Diagnosis not present

## 2015-03-15 DIAGNOSIS — F329 Major depressive disorder, single episode, unspecified: Secondary | ICD-10-CM | POA: Diagnosis not present

## 2015-03-22 DIAGNOSIS — R300 Dysuria: Secondary | ICD-10-CM | POA: Diagnosis not present

## 2015-03-22 DIAGNOSIS — Z0000 Encounter for general adult medical examination without abnormal findings: Secondary | ICD-10-CM | POA: Diagnosis not present

## 2015-03-22 DIAGNOSIS — I10 Essential (primary) hypertension: Secondary | ICD-10-CM | POA: Diagnosis not present

## 2015-03-22 DIAGNOSIS — D696 Thrombocytopenia, unspecified: Secondary | ICD-10-CM | POA: Diagnosis not present

## 2015-03-22 DIAGNOSIS — K7469 Other cirrhosis of liver: Secondary | ICD-10-CM | POA: Diagnosis not present

## 2015-03-22 DIAGNOSIS — E782 Mixed hyperlipidemia: Secondary | ICD-10-CM | POA: Diagnosis not present

## 2015-03-22 DIAGNOSIS — F419 Anxiety disorder, unspecified: Secondary | ICD-10-CM | POA: Diagnosis not present

## 2015-03-22 DIAGNOSIS — F329 Major depressive disorder, single episode, unspecified: Secondary | ICD-10-CM | POA: Diagnosis not present

## 2015-03-22 DIAGNOSIS — R87610 Atypical squamous cells of undetermined significance on cytologic smear of cervix (ASC-US): Secondary | ICD-10-CM | POA: Diagnosis not present

## 2015-03-22 DIAGNOSIS — E1165 Type 2 diabetes mellitus with hyperglycemia: Secondary | ICD-10-CM | POA: Diagnosis not present

## 2015-03-22 DIAGNOSIS — N950 Postmenopausal bleeding: Secondary | ICD-10-CM | POA: Diagnosis not present

## 2015-03-22 DIAGNOSIS — K219 Gastro-esophageal reflux disease without esophagitis: Secondary | ICD-10-CM | POA: Diagnosis not present

## 2015-03-22 DIAGNOSIS — Z1389 Encounter for screening for other disorder: Secondary | ICD-10-CM | POA: Diagnosis not present

## 2015-03-25 DIAGNOSIS — Z79899 Other long term (current) drug therapy: Secondary | ICD-10-CM | POA: Diagnosis not present

## 2015-03-25 DIAGNOSIS — Z794 Long term (current) use of insulin: Secondary | ICD-10-CM | POA: Diagnosis not present

## 2015-03-25 DIAGNOSIS — E119 Type 2 diabetes mellitus without complications: Secondary | ICD-10-CM | POA: Diagnosis not present

## 2015-03-25 DIAGNOSIS — I10 Essential (primary) hypertension: Secondary | ICD-10-CM | POA: Diagnosis not present

## 2015-03-25 DIAGNOSIS — J45909 Unspecified asthma, uncomplicated: Secondary | ICD-10-CM | POA: Diagnosis not present

## 2015-03-25 DIAGNOSIS — Z90710 Acquired absence of both cervix and uterus: Secondary | ICD-10-CM | POA: Diagnosis not present

## 2015-03-25 DIAGNOSIS — R319 Hematuria, unspecified: Secondary | ICD-10-CM | POA: Diagnosis not present

## 2015-03-25 DIAGNOSIS — R1030 Lower abdominal pain, unspecified: Secondary | ICD-10-CM | POA: Diagnosis not present

## 2015-03-26 ENCOUNTER — Encounter (HOSPITAL_COMMUNITY): Payer: Self-pay | Admitting: Emergency Medicine

## 2015-03-26 ENCOUNTER — Emergency Department (HOSPITAL_COMMUNITY)
Admission: EM | Admit: 2015-03-26 | Discharge: 2015-03-26 | Disposition: A | Discharge status: Home / Self Care | Payer: Medicare Other | Attending: Emergency Medicine | Admitting: Emergency Medicine

## 2015-03-26 DIAGNOSIS — E785 Hyperlipidemia, unspecified: Secondary | ICD-10-CM | POA: Diagnosis not present

## 2015-03-26 DIAGNOSIS — Z794 Long term (current) use of insulin: Secondary | ICD-10-CM | POA: Insufficient documentation

## 2015-03-26 DIAGNOSIS — R1031 Right lower quadrant pain: Secondary | ICD-10-CM | POA: Insufficient documentation

## 2015-03-26 DIAGNOSIS — Z7984 Long term (current) use of oral hypoglycemic drugs: Secondary | ICD-10-CM | POA: Diagnosis not present

## 2015-03-26 DIAGNOSIS — F419 Anxiety disorder, unspecified: Secondary | ICD-10-CM | POA: Insufficient documentation

## 2015-03-26 DIAGNOSIS — I10 Essential (primary) hypertension: Secondary | ICD-10-CM | POA: Diagnosis not present

## 2015-03-26 DIAGNOSIS — Z8544 Personal history of malignant neoplasm of other female genital organs: Secondary | ICD-10-CM | POA: Insufficient documentation

## 2015-03-26 DIAGNOSIS — R1032 Left lower quadrant pain: Secondary | ICD-10-CM | POA: Diagnosis not present

## 2015-03-26 DIAGNOSIS — Z79899 Other long term (current) drug therapy: Secondary | ICD-10-CM | POA: Diagnosis not present

## 2015-03-26 DIAGNOSIS — Z8542 Personal history of malignant neoplasm of other parts of uterus: Secondary | ICD-10-CM | POA: Diagnosis not present

## 2015-03-26 DIAGNOSIS — N939 Abnormal uterine and vaginal bleeding, unspecified: Secondary | ICD-10-CM | POA: Diagnosis not present

## 2015-03-26 DIAGNOSIS — R310 Gross hematuria: Secondary | ICD-10-CM | POA: Diagnosis not present

## 2015-03-26 DIAGNOSIS — R319 Hematuria, unspecified: Secondary | ICD-10-CM | POA: Diagnosis not present

## 2015-03-26 DIAGNOSIS — E119 Type 2 diabetes mellitus without complications: Secondary | ICD-10-CM | POA: Diagnosis not present

## 2015-03-26 DIAGNOSIS — R1030 Lower abdominal pain, unspecified: Secondary | ICD-10-CM | POA: Diagnosis not present

## 2015-03-26 NOTE — ED Provider Notes (Signed)
CSN: [id number]     Arrival date & time 03/26/15  0940 History   First MD Initiated Contact with Patient 03/26/15 1012     Chief Complaint  Patient presents with  . Hematuria     (Consider location/radiation/quality/duration/timing/severity/associated sxs/prior Treatment) HPI  70 year old female presents with vaginal bleeding and hematuria. Patient states these have been ongoing for at least 3 months, worse over the last few days. Patient went to [facility] ED last night and had a workup done but wants a second opinion. At that time patient states she had a rectal exam, pelvic exam, urinalysis, labs, and a CT scan. She has brought the results of her labs and CT with her. She was referred to urology in [place], but wants to see a urologist here in [place]. She notes that she saw a gastroenterologist, Dr. Britta Mccreedy, who performed a colonoscopy on 03/22/15 and she had polyps removed but was told that there was no reason she would have bleeding in her colonoscopy. She states there have been clots coming out. She thinks there is blood coming from her vagina but deftly blood coming from her urine as well. She has been having abdominal pain that is on and off over these months but is worse over the last few days as well. She has had a full hysterectomy per her. She still receives Pap smears most recently her PCP did a Pap smear one month ago but she does not know the results. She states the pelvic exam last night was painful and uncomfortable but she was not told if there was bleeding. She is not sure if there was blood on her rectal exam either. Denies chest pain or dizziness/lightheadedness.  Past Medical History  Diagnosis Date  . Allergy   . Anxiety   . Endometrial cancer ([place])   . Vaginal cancer ([place])   . Diabetes mellitus without complication ([place])   . Hypertension   . Hyperlipidemia   . Thyroid nodule    Past Surgical History  Procedure Laterality Date  . Histerectomy     Family History    Problem Relation Age of Onset  . Heart disease Mother   . Diabetes Mother   . Heart disease Father   . Cancer Father   . Cancer Maternal Grandmother   . Heart disease Maternal Grandfather    Social History  Substance Use Topics  . Smoking status: Never Smoker   . Smokeless tobacco: Never Used  . Alcohol Use: No   OB History    No data available     Review of Systems  Constitutional: Negative for fatigue.  Cardiovascular: Negative for chest pain.  Gastrointestinal: Positive for abdominal pain. Negative for vomiting and blood in stool.  Genitourinary: Positive for hematuria and vaginal bleeding. Negative for dysuria.  Neurological: Negative for dizziness, syncope and light-headedness.  All other systems reviewed and are negative.     Allergies  Other  Home Medications   Prior to Admission medications   Medication Sig Start Date End Date Taking? Authorizing Provider  ALPRAZolam Duanne Moron) 0.5 MG tablet Take 0.5 mg by mouth every 6 (six) hours as needed for anxiety.    Historical Provider, MD  atenolol (TENORMIN) 25 MG tablet Take by mouth daily.    Historical Provider, MD  calcium-vitamin D (OSCAL WITH D) 500-200 MG-UNIT per tablet Take 1 tablet by mouth.    Historical Provider, MD  citalopram (CELEXA) 20 MG tablet Take 20 mg by mouth daily.    Historical Provider, MD  doxycycline (VIBRAMYCIN) 50 MG capsule Take 2 capsules (100 mg total) by mouth 2 (two) times daily. For 7 days. 03/30/13   Lahoma Crocker, MD  insulin glargine (LANTUS) 100 UNIT/ML injection Inject 67 Units into the skin at bedtime.    Historical Provider, MD  loratadine (CLARITIN) 10 MG tablet Take 10 mg by mouth daily.    Historical Provider, MD  Multiple Vitamins-Minerals (MULTIVITAMIN WITH MINERALS) tablet Take 1 tablet by mouth daily.    Historical Provider, MD  Omega-3 Fatty Acids (FISH OIL) 1200 MG CAPS Take by mouth daily.    Historical Provider, MD  simvastatin (ZOCOR) 40 MG tablet Take 40 mg by  mouth at bedtime.    Historical Provider, MD  sitaGLIPtin-metformin (JANUMET) 50-500 MG per tablet Take 1 tablet by mouth 2 (two) times daily with a meal.    Historical Provider, MD  valsartan-hydrochlorothiazide (DIOVAN-HCT) 320-25 MG per tablet Take 1 tablet by mouth daily.    Historical Provider, MD   BP 122/65 mmHg  Pulse 65  Temp(Src) 98 F (36.7 C) (Oral)  Resp 16  SpO2 99% Physical Exam  Constitutional: She is oriented to person, place, and time. She appears well-developed and well-nourished.  HENT:  Head: Normocephalic and atraumatic.  Right Ear: External ear normal.  Left Ear: External ear normal.  Nose: Nose normal.  Eyes: Right eye exhibits no discharge. Left eye exhibits no discharge.  Cardiovascular: Normal rate, regular rhythm and normal heart sounds.   Pulmonary/Chest: Effort normal and breath sounds normal.  Abdominal: Soft. There is tenderness in the right lower quadrant, suprapubic area and left lower quadrant.  Neurological: She is alert and oriented to person, place, and time.  Skin: Skin is warm and dry.  Nursing note and vitals reviewed.   ED Course  Procedures (including critical care time) Labs Review Labs Reviewed - No data to display Pertinent labs from 03/25/15 Hemoglobin 13.0 Platelets 113 Creatinine 0.67 Urinalysis Large blood, negative nitrite, leukoctyes  Imaging Review No results found. I have personally reviewed and evaluated these images and lab results as part of my medical decision-making.        EKG Interpretation None      MDM   Final diagnoses:  Hematuria    Patient's primary goal is to see a urologist in the emergency department. See no indication for an emergent urology consultation given she has had the symptoms for several months. I reviewed the CT scan results as well as lab results that they obtained at the outside hospital yesterday. These images and results are above. No alarming findings and CT scan was obtained and  was negative. I offered to repeat rectal and vaginal exams the patient declines. She has no symptoms of significant blood loss. She does not want further blood testing or repeat blood testing. At this point she only wants a urology referral and I having encouraged her to follow-up with GYN. She tells me that her gastroenterologist told her that there was no bleeding coming from her colon. She does not think that her symptoms worsened after the colonoscopy. Discussed return precautions and recommend prompt follow-up with urologist, PCP, and gynecologist.     Sherwood Gambler, MD 03/26/15 [phone]

## 2015-03-26 NOTE — ED Notes (Signed)
Pt reports hematuria/vaginal bleeding x3 months, pt unsure where blood is coming from.  Reports pap smear that she is still awaiting results from. Pt reports having urine tests that have not shown anything. Pt was seen at [facility] hospital last night and reports she had some CT scans done. Pt reports she was referred to a urologist.

## 2015-03-26 NOTE — ED Notes (Signed)
MD at bedside. 

## 2015-04-16 DIAGNOSIS — R1011 Right upper quadrant pain: Secondary | ICD-10-CM | POA: Diagnosis not present

## 2015-04-19 DIAGNOSIS — K838 Other specified diseases of biliary tract: Secondary | ICD-10-CM | POA: Diagnosis not present

## 2015-04-19 DIAGNOSIS — R319 Hematuria, unspecified: Secondary | ICD-10-CM | POA: Diagnosis not present

## 2015-04-19 DIAGNOSIS — K869 Disease of pancreas, unspecified: Secondary | ICD-10-CM | POA: Diagnosis not present

## 2015-04-19 DIAGNOSIS — R1011 Right upper quadrant pain: Secondary | ICD-10-CM | POA: Diagnosis not present

## 2015-04-19 DIAGNOSIS — Z90710 Acquired absence of both cervix and uterus: Secondary | ICD-10-CM | POA: Diagnosis not present

## 2015-04-19 DIAGNOSIS — N939 Abnormal uterine and vaginal bleeding, unspecified: Secondary | ICD-10-CM | POA: Diagnosis not present

## 2015-04-19 DIAGNOSIS — R161 Splenomegaly, not elsewhere classified: Secondary | ICD-10-CM | POA: Diagnosis not present

## 2015-04-20 DIAGNOSIS — R1011 Right upper quadrant pain: Secondary | ICD-10-CM | POA: Diagnosis not present

## 2015-04-20 DIAGNOSIS — K7469 Other cirrhosis of liver: Secondary | ICD-10-CM | POA: Diagnosis not present

## 2015-04-20 DIAGNOSIS — R945 Abnormal results of liver function studies: Secondary | ICD-10-CM | POA: Diagnosis not present

## 2015-04-23 DIAGNOSIS — R310 Gross hematuria: Secondary | ICD-10-CM | POA: Diagnosis not present

## 2015-04-23 DIAGNOSIS — Z0000 Encounter for general adult medical examination without abnormal findings: Secondary | ICD-10-CM | POA: Diagnosis not present

## 2015-04-26 DIAGNOSIS — R935 Abnormal findings on diagnostic imaging of other abdominal regions, including retroperitoneum: Secondary | ICD-10-CM | POA: Diagnosis not present

## 2015-04-26 DIAGNOSIS — R748 Abnormal levels of other serum enzymes: Secondary | ICD-10-CM | POA: Diagnosis not present

## 2015-04-26 DIAGNOSIS — R161 Splenomegaly, not elsewhere classified: Secondary | ICD-10-CM | POA: Diagnosis not present

## 2015-04-26 DIAGNOSIS — R1011 Right upper quadrant pain: Secondary | ICD-10-CM | POA: Diagnosis not present

## 2015-05-04 DIAGNOSIS — J0101 Acute recurrent maxillary sinusitis: Secondary | ICD-10-CM | POA: Diagnosis not present

## 2015-05-04 DIAGNOSIS — R300 Dysuria: Secondary | ICD-10-CM | POA: Diagnosis not present

## 2015-05-04 DIAGNOSIS — K7469 Other cirrhosis of liver: Secondary | ICD-10-CM | POA: Diagnosis not present

## 2015-05-17 DIAGNOSIS — J4521 Mild intermittent asthma with (acute) exacerbation: Secondary | ICD-10-CM | POA: Diagnosis not present

## 2015-06-06 DIAGNOSIS — H68011 Acute Eustachian salpingitis, right ear: Secondary | ICD-10-CM | POA: Diagnosis not present

## 2015-06-08 DIAGNOSIS — R918 Other nonspecific abnormal finding of lung field: Secondary | ICD-10-CM | POA: Diagnosis not present

## 2015-06-08 DIAGNOSIS — E1165 Type 2 diabetes mellitus with hyperglycemia: Secondary | ICD-10-CM | POA: Diagnosis not present

## 2015-06-08 DIAGNOSIS — J019 Acute sinusitis, unspecified: Secondary | ICD-10-CM | POA: Diagnosis not present

## 2015-06-08 DIAGNOSIS — R55 Syncope and collapse: Secondary | ICD-10-CM | POA: Diagnosis not present

## 2015-06-08 DIAGNOSIS — R7309 Other abnormal glucose: Secondary | ICD-10-CM | POA: Diagnosis not present

## 2015-06-08 DIAGNOSIS — Z743 Need for continuous supervision: Secondary | ICD-10-CM | POA: Diagnosis not present

## 2015-06-08 DIAGNOSIS — R4182 Altered mental status, unspecified: Secondary | ICD-10-CM | POA: Diagnosis not present

## 2015-06-08 DIAGNOSIS — R4702 Dysphasia: Secondary | ICD-10-CM | POA: Diagnosis not present

## 2015-06-08 DIAGNOSIS — R531 Weakness: Secondary | ICD-10-CM | POA: Diagnosis not present

## 2015-06-08 DIAGNOSIS — I10 Essential (primary) hypertension: Secondary | ICD-10-CM | POA: Diagnosis not present

## 2015-06-08 DIAGNOSIS — Z79899 Other long term (current) drug therapy: Secondary | ICD-10-CM | POA: Diagnosis not present

## 2015-06-08 DIAGNOSIS — R4701 Aphasia: Secondary | ICD-10-CM | POA: Diagnosis not present

## 2015-06-08 DIAGNOSIS — F329 Major depressive disorder, single episode, unspecified: Secondary | ICD-10-CM | POA: Diagnosis not present

## 2015-06-08 DIAGNOSIS — E871 Hypo-osmolality and hyponatremia: Secondary | ICD-10-CM | POA: Diagnosis not present

## 2015-06-08 DIAGNOSIS — R739 Hyperglycemia, unspecified: Secondary | ICD-10-CM | POA: Diagnosis not present

## 2015-06-08 DIAGNOSIS — F418 Other specified anxiety disorders: Secondary | ICD-10-CM | POA: Diagnosis not present

## 2015-06-08 DIAGNOSIS — F4489 Other dissociative and conversion disorders: Secondary | ICD-10-CM | POA: Diagnosis not present

## 2015-06-08 DIAGNOSIS — E7800 Pure hypercholesterolemia, unspecified: Secondary | ICD-10-CM | POA: Diagnosis not present

## 2015-06-08 DIAGNOSIS — G9340 Encephalopathy, unspecified: Secondary | ICD-10-CM | POA: Diagnosis not present

## 2015-06-08 DIAGNOSIS — G459 Transient cerebral ischemic attack, unspecified: Secondary | ICD-10-CM | POA: Diagnosis not present

## 2015-06-08 DIAGNOSIS — E876 Hypokalemia: Secondary | ICD-10-CM | POA: Diagnosis not present

## 2015-06-08 DIAGNOSIS — R279 Unspecified lack of coordination: Secondary | ICD-10-CM | POA: Diagnosis not present

## 2015-06-08 DIAGNOSIS — R51 Headache: Secondary | ICD-10-CM | POA: Diagnosis not present

## 2015-06-08 DIAGNOSIS — Z9114 Patient's other noncompliance with medication regimen: Secondary | ICD-10-CM | POA: Diagnosis not present

## 2015-06-08 DIAGNOSIS — Z8542 Personal history of malignant neoplasm of other parts of uterus: Secondary | ICD-10-CM | POA: Diagnosis not present

## 2015-06-08 DIAGNOSIS — E119 Type 2 diabetes mellitus without complications: Secondary | ICD-10-CM | POA: Diagnosis not present

## 2015-06-08 DIAGNOSIS — Z9111 Patient's noncompliance with dietary regimen: Secondary | ICD-10-CM | POA: Diagnosis not present

## 2015-06-09 DIAGNOSIS — R4182 Altered mental status, unspecified: Secondary | ICD-10-CM | POA: Diagnosis not present

## 2015-06-09 DIAGNOSIS — R569 Unspecified convulsions: Secondary | ICD-10-CM | POA: Diagnosis not present

## 2015-06-10 DIAGNOSIS — R55 Syncope and collapse: Secondary | ICD-10-CM | POA: Diagnosis not present

## 2015-06-10 LAB — HEMOGLOBIN A1C: Hemoglobin A1C: 11.6

## 2015-06-10 LAB — BASIC METABOLIC PANEL
BUN: 14 mg/dL (ref 4–21)
Creatinine: 0.6 mg/dL (ref 0.5–1.1)
Potassium: 4.3 mmol/L (ref 3.4–5.3)
Sodium: 130 mmol/L — AB (ref 137–147)

## 2015-06-19 DIAGNOSIS — E1165 Type 2 diabetes mellitus with hyperglycemia: Secondary | ICD-10-CM | POA: Diagnosis not present

## 2015-06-19 DIAGNOSIS — F419 Anxiety disorder, unspecified: Secondary | ICD-10-CM | POA: Diagnosis not present

## 2015-06-27 ENCOUNTER — Other Ambulatory Visit: Payer: Self-pay | Admitting: *Deleted

## 2015-06-27 ENCOUNTER — Encounter: Payer: Self-pay | Admitting: Internal Medicine

## 2015-07-10 DIAGNOSIS — F419 Anxiety disorder, unspecified: Secondary | ICD-10-CM | POA: Diagnosis not present

## 2015-07-10 DIAGNOSIS — E1165 Type 2 diabetes mellitus with hyperglycemia: Secondary | ICD-10-CM | POA: Diagnosis not present

## 2015-07-11 ENCOUNTER — Encounter: Payer: Self-pay | Admitting: Internal Medicine

## 2015-07-11 ENCOUNTER — Ambulatory Visit (INDEPENDENT_AMBULATORY_CARE_PROVIDER_SITE_OTHER): Payer: Medicare Other | Admitting: Internal Medicine

## 2015-07-11 VITALS — BP 108/64 | HR 61 | Temp 97.7°F | Resp 12 | Ht 61.5 in | Wt 168.0 lb

## 2015-07-11 DIAGNOSIS — E1165 Type 2 diabetes mellitus with hyperglycemia: Secondary | ICD-10-CM

## 2015-07-11 DIAGNOSIS — Z794 Long term (current) use of insulin: Secondary | ICD-10-CM | POA: Diagnosis not present

## 2015-07-11 DIAGNOSIS — E785 Hyperlipidemia, unspecified: Secondary | ICD-10-CM | POA: Insufficient documentation

## 2015-07-11 DIAGNOSIS — I10 Essential (primary) hypertension: Secondary | ICD-10-CM | POA: Insufficient documentation

## 2015-07-11 DIAGNOSIS — E119 Type 2 diabetes mellitus without complications: Secondary | ICD-10-CM | POA: Insufficient documentation

## 2015-07-11 NOTE — Patient Instructions (Signed)
Please decrease Levemir to 60 units at bedtime.  Increase JanuMet 50-500 to 2 tablets in am.  Start Metformin 1000 mg at dinnertime.  Start Glipizide 5 mg 2x a day, before breakfast and before dinner.  Insulin Breakfast Lunch Dinner Bedtime  JanuMet 2 x 50-500 mg     Metformin   1000 mg   Glipizide 5 mg  5 mg   Levemir    60 units   Please let me know if the sugars are consistently <80 or >200.  Please return in 1.5 months with your sugar log.   PATIENT INSTRUCTIONS FOR TYPE 2 DIABETES:  **Please join MyChart!** - see attached instructions about how to join if you have not done so already.  DIET AND EXERCISE Diet and exercise is an important part of diabetic treatment.  We recommended aerobic exercise in the form of brisk walking (working between 40-60% of maximal aerobic capacity, similar to brisk walking) for 150 minutes per week (such as 30 minutes five days per week) along with 3 times per week performing 'resistance' training (using various gauge rubber tubes with handles) 5-10 exercises involving the major muscle groups (upper body, lower body and core) performing 10-15 repetitions (or near fatigue) each exercise. Start at half the above goal but build slowly to reach the above goals. If limited by weight, joint pain, or disability, we recommend daily walking in a swimming pool with water up to waist to reduce pressure from joints while allow for adequate exercise.    BLOOD GLUCOSES Monitoring your blood glucoses is important for continued management of your diabetes. Please check your blood glucoses 2-4 times a day: fasting, before meals and at bedtime (you can rotate these measurements - e.g. one day check before the 3 meals, the next day check before 2 of the meals and before bedtime, etc.).   HYPOGLYCEMIA (low blood sugar) Hypoglycemia is usually a reaction to not eating, exercising, or taking too much insulin/ other diabetes drugs.  Symptoms include tremors, sweating,  hunger, confusion, headache, etc. Treat IMMEDIATELY with 15 grams of Carbs: . 4 glucose tablets .  cup regular juice/soda . 2 tablespoons raisins . 4 teaspoons sugar . 1 tablespoon honey Recheck blood glucose in 15 mins and repeat above if still symptomatic/blood glucose <100.  RECOMMENDATIONS TO REDUCE YOUR RISK OF DIABETIC COMPLICATIONS: * Take your prescribed MEDICATION(S) * Follow a DIABETIC diet: Complex carbs, fiber rich foods, (monounsaturated and polyunsaturated) fats * AVOID saturated/trans fats, high fat foods, >2,300 mg salt per day. * EXERCISE at least 5 times a week for 30 minutes or preferably daily.  * DO NOT SMOKE OR DRINK more than 1 drink a day. * Check your FEET every day. Do not wear tightfitting shoes. Contact us if you develop an ulcer * See your EYE doctor once a year or more if needed * Get a FLU shot once a year * Get a PNEUMONIA vaccine once before and once after age 39 years  GOALS:  * Your Hemoglobin A1c of <7%  * fasting sugars need to be <130 * after meals sugars need to be <180 (2h after you start eating) * Your Systolic BP should be [id number] or lower  * Your Diastolic BP should be 80 or lower  * Your HDL (Good Cholesterol) should be 40 or higher  * Your LDL (Bad Cholesterol) should be 100 or lower. * Your Triglycerides should be 150 or lower  * Your Urine microalbumin (kidney function) should be <30 * Your Body  Mass Index should be 25 or lower    Please consider the following ways to cut down carbs and fat and increase fiber and micronutrients in your diet: - substitute whole grain for white bread or pasta - substitute brown rice for white rice - substitute 90-calorie flat bread pieces for slices of bread when possible - substitute sweet potatoes or yams for white potatoes - substitute humus for margarine - substitute tofu for cheese when possible - substitute almond or rice milk for regular milk (would not drink soy milk daily due to concern for  soy estrogen influence on breast cancer risk) - substitute dark chocolate for other sweets when possible - substitute water - can add lemon or orange slices for taste - for diet sodas (artificial sweeteners will trick your body that you can eat sweets without getting calories and will lead you to overeating and weight gain in the long run) - do not skip breakfast or other meals (this will slow down the metabolism and will result in more weight gain over time)  - can try smoothies made from fruit and almond/rice milk in am instead of regular breakfast - can also try old-fashioned (not instant) oatmeal made with almond/rice milk in am - order the dressing on the side when eating salad at a restaurant (pour less than half of the dressing on the salad) - eat as little meat as possible - can try juicing, but should not forget that juicing will get rid of the fiber, so would alternate with eating raw veg./fruits or drinking smoothies - use as little oil as possible, even when using olive oil - can dress a salad with a mix of balsamic vinegar and lemon juice, for e.g. - use agave nectar, stevia sugar, or regular sugar rather than artificial sweateners - steam or broil/roast veggies  - snack on veggies/fruit/nuts (unsalted, preferably) when possible, rather than processed foods - reduce or eliminate aspartame in diet (it is in diet sodas, chewing gum, etc) Read the labels!  Try to read Dr. Janene Harvey book: "Program for Reversing Diabetes" for other ideas for healthy eating.

## 2015-07-11 NOTE — Progress Notes (Signed)
Patient ID: Brittany Russo, female   DOB: [birth year], 70 y.o.   MRN: [id number]  HPI: Brittany Russo is a 70 y.o.-year-old female, referred by her PCP, Tawni Carnes, PA, for management of DM2, dx in 2009, insulin-dependent since dx, uncontrolled, with hyperglycemia and history of medication noncompliance. She is here with her husband who offers part of the history. He also has diabetes.  She had a recent episode of loss of consciousness >> sugars 400s >> seizure at [facility]. She admits for noncompliance with her insulin  (prev. on 120 units - with h/o noncompliance) >> per review of D/C summary: sugars 100-200s in the hospital on just 10 units of Levemir >> d/c'd on 20 units.   Last hemoglobin A1c was: Lab Results  Component Value Date   HGBA1C 11.6 06/10/2015   Pt is on a regimen of: - Levemir 20 at bedtime at d/c from the hospital >> 40-80 units 2x a day  - JanuMet 50-500 mg 2x a day  Pt checks her sugars 3x- a day and they are: - am: if she eats at night: 200s, 66-242 - 2h after b'fast: n/c - before lunch: n/c - 2h after lunch: 77-222, 294 - before dinner: 84-200 - 2h after dinner: n/c - bedtime: 130-231 - nighttime: n/c No lows. Lowest sugar was 66; she has hypoglycemia awareness at 70.  Highest sugars: 400s.  Glucometer: [employer]  Pt's meals are: - Breakfast: toast + eggs - Lunch: soup + salad - Dinner: hamburger, grilled chicken, veggies - Snacks: cuties, honey bar No consistent exercise.  - no CKD, last BUN/creatinine:  Lab Results  Component Value Date   BUN 14 06/10/2015   CREATININE 0.6 06/10/2015  On Valsartan. - she has a h/o HL. Last set of lipids: No results found for: CHOL, HDL, LDLCALC, LDLDIRECT, TRIG, CHOLHDL  On simvastatin. - last eye exam was in 2015. No DR. Has another one in May. - resolved numbness and tingling in her feet.  Pt has FH of DM in daughter, brother, sister, mother.  ROS: Constitutional: + weight gain and loss, + fatigue, +  poor sleep, + nocturia Eyes: + blurry vision, no xerophthalmia ENT: no sore throat, no nodules palpated in throat, no dysphagia/odynophagia, no hoarseness, + tinnitus Cardiovascular: no CP/+ SOB/+ palpitations/no leg swelling Respiratory: + cough/+ SOB/+ wheezing Gastrointestinal: + N/no V/D/+ C, + heartburn Musculoskeletal: + muscle aches/+ joint aches Skin: no rashes, + easy bruising, + hair loss Neurological: no tremors/numbness/tingling/dizziness, + HA Psychiatric: + both: depression/anxiety  Past Medical History  Diagnosis Date  . Allergy   . Anxiety   . Endometrial cancer ([place])   . Vaginal cancer ([place])   . Diabetes mellitus without complication ([place])   . Hypertension   . Hyperlipidemia   . Thyroid nodule    Past Surgical History  Procedure Laterality Date  . Histerectomy     Social History   Social History  . Marital Status: Married    Spouse Name: N/A  . Number of Children: 4   Occupational History  . n/a   Social History Main Topics  . Smoking status: Never Smoker   . Smokeless tobacco: Never Used  . Alcohol Use: No  . Drug Use: No   Current Outpatient Prescriptions on File Prior to Visit  Medication Sig Dispense Refill  . ALPRAZolam (XANAX) 0.5 MG tablet Take 0.25 mg by mouth every 6 (six) hours as needed for anxiety.     [name] atenolol (TENORMIN) 25 MG tablet Take  by mouth daily.    . calcium-vitamin D (OSCAL WITH D) 500-200 MG-UNIT per tablet Take 1 tablet by mouth.    . citalopram (CELEXA) 20 MG tablet Take 20 mg by mouth daily.    . montelukast (SINGULAIR) 10 MG tablet Take 10 mg by mouth at bedtime.    . Multiple Vitamins-Minerals (MULTIVITAMIN WITH MINERALS) tablet Take 1 tablet by mouth daily.    . Omega-3 Fatty Acids (FISH OIL) 1200 MG CAPS Take by mouth daily.    . simvastatin (ZOCOR) 40 MG tablet Take 40 mg by mouth at bedtime.    . sitaGLIPtin-metformin (JANUMET) 50-500 MG per tablet Take 1 tablet by mouth 2 (two) times daily with a meal.    .  valsartan-hydrochlorothiazide (DIOVAN-HCT) 320-25 MG per tablet Take 1 tablet by mouth daily.     No current facility-administered medications on file prior to visit.   Allergies  Allergen Reactions  . Other     Seasonal allergies   Family History  Problem Relation Age of Onset  . Heart disease Mother   . Diabetes Mother   . Heart disease Father   . Cancer Father   . Cancer Maternal Grandmother   . Heart disease Maternal Grandfather    PE: BP 108/64 mmHg  Pulse 61  Temp(Src) 97.7 F (36.5 C) (Oral)  Resp 12  Ht 5' 1.5" (1.562 m)  Wt 168 lb (76.204 kg)  BMI 31.23 kg/m2  SpO2 95% Wt Readings from Last 3 Encounters:  07/11/15 168 lb (76.204 kg)  03/06/13 180 lb (81.647 kg)   Constitutional: overweight, in NAD Eyes: PERRLA, EOMI, no exophthalmos ENT: moist mucous membranes, no thyromegaly, no cervical lymphadenopathy Cardiovascular: RRR, No MRG Respiratory: CTA B Gastrointestinal: abdomen soft, NT, ND, BS+ Musculoskeletal: no deformities, strength intact in all 4 Skin: moist, warm, no rashes Neurological: no tremor with outstretched hands, DTR normal in all 4  ASSESSMENT: 1. DM2, insulin-dependent, uncontrolled, without complications  - + Seizure in the setting of hyperglycemia, however, this coincides with stopping Xanax, on which she was on for a long time reportedly  PLAN:  1. Patient with long-standing, uncontrolled diabetes, on oral antidiabetic regimen + basal insulin with fluctuating blood sugars. She has a h/o noncompliance with her insulin, reason for which, the dose was continuously increased to 120-140 units. She did well in the hospital recently on much less >> Discharge only on 20 units of Levemir along with her Janumet. However, due to high sugars during the day, she increased it dose of Levemir to 40-80 units twice a day. Her sugars are very fluctuating between 60s and 200s.  - We discussed about the need to decrease her basal insulin and cover her meals  better. For now, I advised her to decrease the dose of Levemir to approximately half of what she is injecting daily, increase the metformin dose to target dose of her thousand milligrams twice a day, move all general gain the morning, and add glipizide before first and last meals of the day. We are trying to avoid mealtime insulin, however, at next visit, if sugars are not better controlled, this might be the next option. - We also discussed about diet and I suggested some changes. - I suggested to:  Patient Instructions   Please decrease Levemir to 60 units at bedtime.  Increase JanuMet 50-500 to 2 tablets in am.  Start Metformin 1000 mg at dinnertime.  Start Glipizide 5 mg 2x a day, before breakfast and before dinner.  Insulin Breakfast  Lunch Dinner Bedtime  JanuMet 2 x 50-500 mg     Metformin   1000 mg   Glipizide 5 mg  5 mg   Levemir    60 units   Please let me know if the sugars are consistently <80 or >200.  Please return in 1.5 months with your sugar log.   - Strongly advised her to start checking sugars at different times of the day - check 3 times a day, rotating checks - given sugar log and advised how to fill it and to bring it at next appt  - given foot care handout and explained the principles  - given instructions for hypoglycemia management "15-15 rule"  - advised for yearly eye exams >> She has one scheduled - Return to clinic in 1.5 mo with sugar log

## 2015-07-12 DIAGNOSIS — G4700 Insomnia, unspecified: Secondary | ICD-10-CM | POA: Diagnosis not present

## 2015-07-12 DIAGNOSIS — E1165 Type 2 diabetes mellitus with hyperglycemia: Secondary | ICD-10-CM | POA: Diagnosis not present

## 2015-07-12 DIAGNOSIS — F419 Anxiety disorder, unspecified: Secondary | ICD-10-CM | POA: Diagnosis not present

## 2015-07-15 ENCOUNTER — Telehealth: Payer: Self-pay | Admitting: Internal Medicine

## 2015-07-15 NOTE — Telephone Encounter (Signed)
Pt calling regarding the instructions for her blood testing 7 times day  She is running out of test strips can we call them into her mailing pharmacy

## 2015-07-15 NOTE — Telephone Encounter (Signed)
Call pt when you can please

## 2015-07-15 NOTE — Telephone Encounter (Signed)
Spoke with pt and advised her that per Dr Arman Filter office note, she is to test 3 times daily (rotating the times of the day that she checks). Pt to contact the mail order company and have them fax a refill form for test strips to our office.

## 2015-07-25 ENCOUNTER — Encounter: Payer: Self-pay | Admitting: Internal Medicine

## 2015-07-29 ENCOUNTER — Telehealth: Payer: Self-pay | Admitting: Internal Medicine

## 2015-07-29 NOTE — Telephone Encounter (Signed)
Patient dismissed from [facility] by Philemon Kingdom MD , effective Jul 25, 2015. Dismissal letter sent out by certified / registered mail.  DAJ  Received signed domestic return receipt verifying delivery of certified letter on Aug 01, 2015. Article number [id number] [place] DAJ

## 2015-07-31 DIAGNOSIS — H2513 Age-related nuclear cataract, bilateral: Secondary | ICD-10-CM | POA: Diagnosis not present

## 2015-07-31 DIAGNOSIS — H538 Other visual disturbances: Secondary | ICD-10-CM | POA: Diagnosis not present

## 2015-08-26 DIAGNOSIS — J019 Acute sinusitis, unspecified: Secondary | ICD-10-CM | POA: Diagnosis not present

## 2015-08-29 ENCOUNTER — Ambulatory Visit: Payer: Medicare Other | Admitting: Internal Medicine

## 2015-10-22 DIAGNOSIS — F419 Anxiety disorder, unspecified: Secondary | ICD-10-CM | POA: Diagnosis not present

## 2015-10-22 DIAGNOSIS — E1165 Type 2 diabetes mellitus with hyperglycemia: Secondary | ICD-10-CM | POA: Diagnosis not present

## 2015-10-22 DIAGNOSIS — I10 Essential (primary) hypertension: Secondary | ICD-10-CM | POA: Diagnosis not present

## 2015-10-22 DIAGNOSIS — E782 Mixed hyperlipidemia: Secondary | ICD-10-CM | POA: Diagnosis not present

## 2015-10-22 DIAGNOSIS — K219 Gastro-esophageal reflux disease without esophagitis: Secondary | ICD-10-CM | POA: Diagnosis not present

## 2015-10-22 DIAGNOSIS — D696 Thrombocytopenia, unspecified: Secondary | ICD-10-CM | POA: Diagnosis not present

## 2015-10-25 DIAGNOSIS — E1165 Type 2 diabetes mellitus with hyperglycemia: Secondary | ICD-10-CM | POA: Diagnosis not present

## 2015-10-25 DIAGNOSIS — G4700 Insomnia, unspecified: Secondary | ICD-10-CM | POA: Diagnosis not present

## 2015-10-25 DIAGNOSIS — Z8542 Personal history of malignant neoplasm of other parts of uterus: Secondary | ICD-10-CM | POA: Diagnosis not present

## 2015-10-25 DIAGNOSIS — Z6831 Body mass index (BMI) 31.0-31.9, adult: Secondary | ICD-10-CM | POA: Diagnosis not present

## 2015-10-25 DIAGNOSIS — D649 Anemia, unspecified: Secondary | ICD-10-CM | POA: Diagnosis not present

## 2015-10-25 DIAGNOSIS — F419 Anxiety disorder, unspecified: Secondary | ICD-10-CM | POA: Diagnosis not present

## 2015-12-27 DIAGNOSIS — Z23 Encounter for immunization: Secondary | ICD-10-CM | POA: Diagnosis not present

## 2016-01-02 DIAGNOSIS — E1165 Type 2 diabetes mellitus with hyperglycemia: Secondary | ICD-10-CM | POA: Diagnosis not present

## 2016-01-02 DIAGNOSIS — D649 Anemia, unspecified: Secondary | ICD-10-CM | POA: Diagnosis not present

## 2016-01-02 DIAGNOSIS — Z6831 Body mass index (BMI) 31.0-31.9, adult: Secondary | ICD-10-CM | POA: Diagnosis not present

## 2016-01-02 DIAGNOSIS — Z8542 Personal history of malignant neoplasm of other parts of uterus: Secondary | ICD-10-CM | POA: Diagnosis not present

## 2016-01-02 DIAGNOSIS — G4700 Insomnia, unspecified: Secondary | ICD-10-CM | POA: Diagnosis not present

## 2016-01-02 DIAGNOSIS — J019 Acute sinusitis, unspecified: Secondary | ICD-10-CM | POA: Diagnosis not present

## 2016-01-02 DIAGNOSIS — F419 Anxiety disorder, unspecified: Secondary | ICD-10-CM | POA: Diagnosis not present

## 2016-02-25 DIAGNOSIS — D696 Thrombocytopenia, unspecified: Secondary | ICD-10-CM | POA: Diagnosis not present

## 2016-02-25 DIAGNOSIS — E782 Mixed hyperlipidemia: Secondary | ICD-10-CM | POA: Diagnosis not present

## 2016-02-25 DIAGNOSIS — D649 Anemia, unspecified: Secondary | ICD-10-CM | POA: Diagnosis not present

## 2016-02-25 DIAGNOSIS — E1165 Type 2 diabetes mellitus with hyperglycemia: Secondary | ICD-10-CM | POA: Diagnosis not present

## 2016-02-25 DIAGNOSIS — K219 Gastro-esophageal reflux disease without esophagitis: Secondary | ICD-10-CM | POA: Diagnosis not present

## 2016-02-25 DIAGNOSIS — R945 Abnormal results of liver function studies: Secondary | ICD-10-CM | POA: Diagnosis not present

## 2016-03-26 DIAGNOSIS — E1165 Type 2 diabetes mellitus with hyperglycemia: Secondary | ICD-10-CM | POA: Diagnosis not present

## 2016-03-26 DIAGNOSIS — F419 Anxiety disorder, unspecified: Secondary | ICD-10-CM | POA: Diagnosis not present

## 2016-03-26 DIAGNOSIS — Z6831 Body mass index (BMI) 31.0-31.9, adult: Secondary | ICD-10-CM | POA: Diagnosis not present

## 2016-03-26 DIAGNOSIS — G4700 Insomnia, unspecified: Secondary | ICD-10-CM | POA: Diagnosis not present

## 2016-03-26 DIAGNOSIS — Z8542 Personal history of malignant neoplasm of other parts of uterus: Secondary | ICD-10-CM | POA: Diagnosis not present

## 2016-05-28 DIAGNOSIS — R159 Full incontinence of feces: Secondary | ICD-10-CM | POA: Diagnosis not present

## 2016-05-28 DIAGNOSIS — K7460 Unspecified cirrhosis of liver: Secondary | ICD-10-CM | POA: Diagnosis not present

## 2016-07-13 DIAGNOSIS — D649 Anemia, unspecified: Secondary | ICD-10-CM | POA: Diagnosis not present

## 2016-07-13 DIAGNOSIS — E782 Mixed hyperlipidemia: Secondary | ICD-10-CM | POA: Diagnosis not present

## 2016-07-13 DIAGNOSIS — D696 Thrombocytopenia, unspecified: Secondary | ICD-10-CM | POA: Diagnosis not present

## 2016-07-13 DIAGNOSIS — E1165 Type 2 diabetes mellitus with hyperglycemia: Secondary | ICD-10-CM | POA: Diagnosis not present

## 2016-07-13 DIAGNOSIS — K219 Gastro-esophageal reflux disease without esophagitis: Secondary | ICD-10-CM | POA: Diagnosis not present

## 2016-07-13 DIAGNOSIS — I10 Essential (primary) hypertension: Secondary | ICD-10-CM | POA: Diagnosis not present

## 2016-07-15 DIAGNOSIS — Z6831 Body mass index (BMI) 31.0-31.9, adult: Secondary | ICD-10-CM | POA: Diagnosis not present

## 2016-07-15 DIAGNOSIS — J019 Acute sinusitis, unspecified: Secondary | ICD-10-CM | POA: Diagnosis not present

## 2016-07-15 DIAGNOSIS — G4700 Insomnia, unspecified: Secondary | ICD-10-CM | POA: Diagnosis not present

## 2016-07-15 DIAGNOSIS — E1165 Type 2 diabetes mellitus with hyperglycemia: Secondary | ICD-10-CM | POA: Diagnosis not present

## 2016-07-15 DIAGNOSIS — I10 Essential (primary) hypertension: Secondary | ICD-10-CM | POA: Diagnosis not present

## 2016-07-15 DIAGNOSIS — D696 Thrombocytopenia, unspecified: Secondary | ICD-10-CM | POA: Diagnosis not present

## 2016-07-15 DIAGNOSIS — F419 Anxiety disorder, unspecified: Secondary | ICD-10-CM | POA: Diagnosis not present

## 2016-07-16 DIAGNOSIS — K580 Irritable bowel syndrome with diarrhea: Secondary | ICD-10-CM | POA: Diagnosis not present

## 2016-07-29 DIAGNOSIS — J209 Acute bronchitis, unspecified: Secondary | ICD-10-CM | POA: Diagnosis not present

## 2016-07-29 DIAGNOSIS — Z6830 Body mass index (BMI) 30.0-30.9, adult: Secondary | ICD-10-CM | POA: Diagnosis not present

## 2016-07-29 DIAGNOSIS — J0101 Acute recurrent maxillary sinusitis: Secondary | ICD-10-CM | POA: Diagnosis not present

## 2016-09-10 DIAGNOSIS — I8500 Esophageal varices without bleeding: Secondary | ICD-10-CM | POA: Diagnosis not present

## 2016-09-10 DIAGNOSIS — K580 Irritable bowel syndrome with diarrhea: Secondary | ICD-10-CM | POA: Diagnosis not present

## 2016-10-05 DIAGNOSIS — I8500 Esophageal varices without bleeding: Secondary | ICD-10-CM | POA: Diagnosis not present

## 2016-10-05 DIAGNOSIS — Z01818 Encounter for other preprocedural examination: Secondary | ICD-10-CM | POA: Diagnosis not present

## 2016-10-05 DIAGNOSIS — E119 Type 2 diabetes mellitus without complications: Secondary | ICD-10-CM | POA: Diagnosis not present

## 2016-11-06 DIAGNOSIS — J019 Acute sinusitis, unspecified: Secondary | ICD-10-CM | POA: Diagnosis not present

## 2016-11-06 DIAGNOSIS — Z6832 Body mass index (BMI) 32.0-32.9, adult: Secondary | ICD-10-CM | POA: Diagnosis not present

## 2016-12-01 DIAGNOSIS — F419 Anxiety disorder, unspecified: Secondary | ICD-10-CM | POA: Diagnosis not present

## 2016-12-01 DIAGNOSIS — Z8542 Personal history of malignant neoplasm of other parts of uterus: Secondary | ICD-10-CM | POA: Diagnosis not present

## 2016-12-01 DIAGNOSIS — E1165 Type 2 diabetes mellitus with hyperglycemia: Secondary | ICD-10-CM | POA: Diagnosis not present

## 2016-12-01 DIAGNOSIS — E782 Mixed hyperlipidemia: Secondary | ICD-10-CM | POA: Diagnosis not present

## 2016-12-01 DIAGNOSIS — G4700 Insomnia, unspecified: Secondary | ICD-10-CM | POA: Diagnosis not present

## 2016-12-01 DIAGNOSIS — R319 Hematuria, unspecified: Secondary | ICD-10-CM | POA: Diagnosis not present

## 2016-12-01 DIAGNOSIS — I10 Essential (primary) hypertension: Secondary | ICD-10-CM | POA: Diagnosis not present

## 2016-12-01 DIAGNOSIS — Z6831 Body mass index (BMI) 31.0-31.9, adult: Secondary | ICD-10-CM | POA: Diagnosis not present

## 2016-12-01 DIAGNOSIS — J019 Acute sinusitis, unspecified: Secondary | ICD-10-CM | POA: Diagnosis not present

## 2016-12-15 DIAGNOSIS — Z1231 Encounter for screening mammogram for malignant neoplasm of breast: Secondary | ICD-10-CM | POA: Diagnosis not present

## 2016-12-30 DIAGNOSIS — R921 Mammographic calcification found on diagnostic imaging of breast: Secondary | ICD-10-CM | POA: Diagnosis not present

## 2017-01-08 DIAGNOSIS — Z23 Encounter for immunization: Secondary | ICD-10-CM | POA: Diagnosis not present

## 2017-01-11 DIAGNOSIS — N760 Acute vaginitis: Secondary | ICD-10-CM | POA: Diagnosis not present

## 2017-01-11 DIAGNOSIS — Z124 Encounter for screening for malignant neoplasm of cervix: Secondary | ICD-10-CM | POA: Diagnosis not present

## 2017-01-11 DIAGNOSIS — Z0000 Encounter for general adult medical examination without abnormal findings: Secondary | ICD-10-CM | POA: Diagnosis not present

## 2017-02-25 DIAGNOSIS — G4700 Insomnia, unspecified: Secondary | ICD-10-CM | POA: Diagnosis not present

## 2017-02-25 DIAGNOSIS — E782 Mixed hyperlipidemia: Secondary | ICD-10-CM | POA: Diagnosis not present

## 2017-02-25 DIAGNOSIS — I10 Essential (primary) hypertension: Secondary | ICD-10-CM | POA: Diagnosis not present

## 2017-02-25 DIAGNOSIS — F419 Anxiety disorder, unspecified: Secondary | ICD-10-CM | POA: Diagnosis not present

## 2017-02-25 DIAGNOSIS — M25532 Pain in left wrist: Secondary | ICD-10-CM | POA: Diagnosis not present

## 2017-02-25 DIAGNOSIS — J019 Acute sinusitis, unspecified: Secondary | ICD-10-CM | POA: Diagnosis not present

## 2017-02-25 DIAGNOSIS — D696 Thrombocytopenia, unspecified: Secondary | ICD-10-CM | POA: Diagnosis not present

## 2017-02-25 DIAGNOSIS — E1165 Type 2 diabetes mellitus with hyperglycemia: Secondary | ICD-10-CM | POA: Diagnosis not present

## 2017-02-25 DIAGNOSIS — Z6831 Body mass index (BMI) 31.0-31.9, adult: Secondary | ICD-10-CM | POA: Diagnosis not present

## 2017-02-25 DIAGNOSIS — Z8542 Personal history of malignant neoplasm of other parts of uterus: Secondary | ICD-10-CM | POA: Diagnosis not present

## 2017-03-22 DIAGNOSIS — M25562 Pain in left knee: Secondary | ICD-10-CM | POA: Diagnosis not present

## 2017-03-22 DIAGNOSIS — M25561 Pain in right knee: Secondary | ICD-10-CM | POA: Diagnosis not present

## 2017-03-22 DIAGNOSIS — M654 Radial styloid tenosynovitis [de Quervain]: Secondary | ICD-10-CM | POA: Diagnosis not present

## 2017-03-22 DIAGNOSIS — M170 Bilateral primary osteoarthritis of knee: Secondary | ICD-10-CM | POA: Diagnosis not present

## 2017-03-30 DIAGNOSIS — I10 Essential (primary) hypertension: Secondary | ICD-10-CM | POA: Diagnosis not present

## 2017-03-30 DIAGNOSIS — R300 Dysuria: Secondary | ICD-10-CM | POA: Diagnosis not present

## 2017-03-30 DIAGNOSIS — K828 Other specified diseases of gallbladder: Secondary | ICD-10-CM | POA: Diagnosis not present

## 2017-03-30 DIAGNOSIS — E119 Type 2 diabetes mellitus without complications: Secondary | ICD-10-CM | POA: Diagnosis not present

## 2017-03-30 DIAGNOSIS — Z6834 Body mass index (BMI) 34.0-34.9, adult: Secondary | ICD-10-CM | POA: Diagnosis not present

## 2017-03-30 DIAGNOSIS — N341 Nonspecific urethritis: Secondary | ICD-10-CM | POA: Diagnosis not present

## 2017-03-30 DIAGNOSIS — M47819 Spondylosis without myelopathy or radiculopathy, site unspecified: Secondary | ICD-10-CM | POA: Diagnosis not present

## 2017-03-30 DIAGNOSIS — K649 Unspecified hemorrhoids: Secondary | ICD-10-CM | POA: Diagnosis not present

## 2017-03-30 DIAGNOSIS — Z923 Personal history of irradiation: Secondary | ICD-10-CM | POA: Diagnosis not present

## 2017-03-30 DIAGNOSIS — E785 Hyperlipidemia, unspecified: Secondary | ICD-10-CM | POA: Diagnosis not present

## 2017-03-30 DIAGNOSIS — K625 Hemorrhage of anus and rectum: Secondary | ICD-10-CM | POA: Diagnosis not present

## 2017-03-30 DIAGNOSIS — F329 Major depressive disorder, single episode, unspecified: Secondary | ICD-10-CM | POA: Diagnosis not present

## 2017-03-30 DIAGNOSIS — K838 Other specified diseases of biliary tract: Secondary | ICD-10-CM | POA: Diagnosis not present

## 2017-03-30 DIAGNOSIS — R161 Splenomegaly, not elsewhere classified: Secondary | ICD-10-CM | POA: Diagnosis not present

## 2017-03-30 DIAGNOSIS — Z90722 Acquired absence of ovaries, bilateral: Secondary | ICD-10-CM | POA: Diagnosis not present

## 2017-03-30 DIAGNOSIS — K7460 Unspecified cirrhosis of liver: Secondary | ICD-10-CM | POA: Diagnosis not present

## 2017-03-30 DIAGNOSIS — J329 Chronic sinusitis, unspecified: Secondary | ICD-10-CM | POA: Diagnosis not present

## 2017-03-30 DIAGNOSIS — F419 Anxiety disorder, unspecified: Secondary | ICD-10-CM | POA: Diagnosis not present

## 2017-03-30 DIAGNOSIS — E663 Overweight: Secondary | ICD-10-CM | POA: Diagnosis not present

## 2017-03-30 DIAGNOSIS — D696 Thrombocytopenia, unspecified: Secondary | ICD-10-CM | POA: Diagnosis not present

## 2017-04-01 DIAGNOSIS — R0602 Shortness of breath: Secondary | ICD-10-CM | POA: Diagnosis not present

## 2017-04-01 DIAGNOSIS — R740 Nonspecific elevation of levels of transaminase and lactic acid dehydrogenase [LDH]: Secondary | ICD-10-CM | POA: Diagnosis not present

## 2017-04-01 DIAGNOSIS — Z8542 Personal history of malignant neoplasm of other parts of uterus: Secondary | ICD-10-CM | POA: Diagnosis not present

## 2017-04-01 DIAGNOSIS — E871 Hypo-osmolality and hyponatremia: Secondary | ICD-10-CM | POA: Diagnosis not present

## 2017-04-01 DIAGNOSIS — N950 Postmenopausal bleeding: Secondary | ICD-10-CM | POA: Diagnosis not present

## 2017-04-01 DIAGNOSIS — E876 Hypokalemia: Secondary | ICD-10-CM | POA: Diagnosis not present

## 2017-04-01 DIAGNOSIS — D696 Thrombocytopenia, unspecified: Secondary | ICD-10-CM | POA: Diagnosis not present

## 2017-04-01 DIAGNOSIS — Z6831 Body mass index (BMI) 31.0-31.9, adult: Secondary | ICD-10-CM | POA: Diagnosis not present

## 2017-04-02 DIAGNOSIS — E538 Deficiency of other specified B group vitamins: Secondary | ICD-10-CM | POA: Diagnosis not present

## 2017-04-02 DIAGNOSIS — D508 Other iron deficiency anemias: Secondary | ICD-10-CM | POA: Diagnosis not present

## 2017-04-02 DIAGNOSIS — Z1159 Encounter for screening for other viral diseases: Secondary | ICD-10-CM | POA: Diagnosis not present

## 2017-04-06 DIAGNOSIS — E11319 Type 2 diabetes mellitus with unspecified diabetic retinopathy without macular edema: Secondary | ICD-10-CM | POA: Diagnosis not present

## 2017-04-07 DIAGNOSIS — I712 Thoracic aortic aneurysm, without rupture: Secondary | ICD-10-CM | POA: Diagnosis not present

## 2017-04-07 DIAGNOSIS — K7460 Unspecified cirrhosis of liver: Secondary | ICD-10-CM | POA: Diagnosis not present

## 2017-04-07 DIAGNOSIS — N939 Abnormal uterine and vaginal bleeding, unspecified: Secondary | ICD-10-CM | POA: Diagnosis not present

## 2017-04-07 DIAGNOSIS — R911 Solitary pulmonary nodule: Secondary | ICD-10-CM | POA: Diagnosis not present

## 2017-04-07 DIAGNOSIS — J984 Other disorders of lung: Secondary | ICD-10-CM | POA: Diagnosis not present

## 2017-04-07 DIAGNOSIS — I700 Atherosclerosis of aorta: Secondary | ICD-10-CM | POA: Diagnosis not present

## 2017-04-07 DIAGNOSIS — R0602 Shortness of breath: Secondary | ICD-10-CM | POA: Diagnosis not present

## 2017-04-07 DIAGNOSIS — R109 Unspecified abdominal pain: Secondary | ICD-10-CM | POA: Diagnosis not present

## 2017-04-16 DIAGNOSIS — I700 Atherosclerosis of aorta: Secondary | ICD-10-CM | POA: Diagnosis not present

## 2017-04-16 DIAGNOSIS — E538 Deficiency of other specified B group vitamins: Secondary | ICD-10-CM | POA: Diagnosis not present

## 2017-04-16 DIAGNOSIS — R718 Other abnormality of red blood cells: Secondary | ICD-10-CM | POA: Diagnosis not present

## 2017-04-16 DIAGNOSIS — R7989 Other specified abnormal findings of blood chemistry: Secondary | ICD-10-CM | POA: Diagnosis not present

## 2017-04-16 DIAGNOSIS — K7469 Other cirrhosis of liver: Secondary | ICD-10-CM | POA: Diagnosis not present

## 2017-04-16 DIAGNOSIS — D696 Thrombocytopenia, unspecified: Secondary | ICD-10-CM | POA: Diagnosis not present

## 2017-04-16 DIAGNOSIS — R911 Solitary pulmonary nodule: Secondary | ICD-10-CM | POA: Diagnosis not present

## 2017-04-27 DIAGNOSIS — I517 Cardiomegaly: Secondary | ICD-10-CM | POA: Diagnosis not present

## 2017-04-27 DIAGNOSIS — R931 Abnormal findings on diagnostic imaging of heart and coronary circulation: Secondary | ICD-10-CM | POA: Diagnosis not present

## 2017-04-27 DIAGNOSIS — I340 Nonrheumatic mitral (valve) insufficiency: Secondary | ICD-10-CM | POA: Diagnosis not present

## 2017-04-27 DIAGNOSIS — I700 Atherosclerosis of aorta: Secondary | ICD-10-CM | POA: Diagnosis not present

## 2017-04-27 DIAGNOSIS — E538 Deficiency of other specified B group vitamins: Secondary | ICD-10-CM | POA: Diagnosis not present

## 2017-04-27 DIAGNOSIS — I351 Nonrheumatic aortic (valve) insufficiency: Secondary | ICD-10-CM | POA: Diagnosis not present

## 2017-05-14 DIAGNOSIS — D649 Anemia, unspecified: Secondary | ICD-10-CM | POA: Diagnosis not present

## 2017-05-14 DIAGNOSIS — E871 Hypo-osmolality and hyponatremia: Secondary | ICD-10-CM | POA: Diagnosis not present

## 2017-05-14 DIAGNOSIS — Z6833 Body mass index (BMI) 33.0-33.9, adult: Secondary | ICD-10-CM | POA: Diagnosis not present

## 2017-05-14 DIAGNOSIS — I10 Essential (primary) hypertension: Secondary | ICD-10-CM | POA: Diagnosis not present

## 2017-05-14 DIAGNOSIS — F419 Anxiety disorder, unspecified: Secondary | ICD-10-CM | POA: Diagnosis not present

## 2017-05-20 DIAGNOSIS — H25813 Combined forms of age-related cataract, bilateral: Secondary | ICD-10-CM | POA: Diagnosis not present

## 2017-05-28 DIAGNOSIS — H2512 Age-related nuclear cataract, left eye: Secondary | ICD-10-CM | POA: Diagnosis not present

## 2017-05-28 NOTE — Patient Instructions (Signed)
Your procedure is scheduled on: 06/04/2017  Report to [facility] at   34  AM.  Call this number if you have problems the morning of surgery: [phone]   Do not eat food or drink liquids :After Midnight.      Take these medicines the morning of surgery with A SIP OF WATER: xanax, atenolol, celexa, singulair, diovan.   Do not wear jewelry, make-up or nail polish.  Do not wear lotions, powders, or perfumes. You may wear deodorant.  Do not shave 48 hours prior to surgery.  Do not bring valuables to the hospital.  Contacts, dentures or bridgework may not be worn into surgery.  Leave suitcase in the car. After surgery it may be brought to your room.  For patients admitted to the hospital, checkout time is 11:00 AM the day of discharge.   Patients discharged the day of surgery will not be allowed to drive home.  :     Please read over the following fact sheets that you were given: Coughing and Deep Breathing, Surgical Site Infection Prevention, Anesthesia Post-op Instructions and Care and Recovery After Surgery    Cataract A cataract is a clouding of the lens of the eye. When a lens becomes cloudy, vision is reduced based on the degree and nature of the clouding. Many cataracts reduce vision to some degree. Some cataracts make people more near-sighted as they develop. Other cataracts increase glare. Cataracts that are ignored and become worse can sometimes look white. The white color can be seen through the pupil. CAUSES   Aging. However, cataracts may occur at any age, even in newborns.   Certain drugs.   Trauma to the eye.   Certain diseases such as diabetes.   Specific eye diseases such as chronic inflammation inside the eye or a sudden attack of a rare form of glaucoma.   Inherited or acquired medical problems.  SYMPTOMS   Gradual, progressive drop in vision in the affected eye.   Severe, rapid visual loss. This most often happens when trauma is the cause.  DIAGNOSIS  To  detect a cataract, an eye doctor examines the lens. Cataracts are best diagnosed with an exam of the eyes with the pupils enlarged (dilated) by drops.  TREATMENT  For an early cataract, vision may improve by using different eyeglasses or stronger lighting. If that does not help your vision, surgery is the only effective treatment. A cataract needs to be surgically removed when vision loss interferes with your everyday activities, such as driving, reading, or watching TV. A cataract may also have to be removed if it prevents examination or treatment of another eye problem. Surgery removes the cloudy lens and usually replaces it with a substitute lens (intraocular lens, IOL).  At a time when both you and your doctor agree, the cataract will be surgically removed. If you have cataracts in both eyes, only one is usually removed at a time. This allows the operated eye to heal and be out of danger from any possible problems after surgery (such as infection or poor wound healing). In rare cases, a cataract may be doing damage to your eye. In these cases, your caregiver may advise surgical removal right away. The vast majority of people who have cataract surgery have better vision afterward. HOME CARE INSTRUCTIONS  If you are not planning surgery, you may be asked to do the following:  Use different eyeglasses.   Use stronger or brighter lighting.   Ask your eye doctor about  reducing your medicine dose or changing medicines if it is thought that a medicine caused your cataract. Changing medicines does not make the cataract go away on its own.   Become familiar with your surroundings. Poor vision can lead to injury. Avoid bumping into things on the affected side. You are at a higher risk for tripping or falling.   Exercise extreme care when driving or operating machinery.   Wear sunglasses if you are sensitive to bright light or experiencing problems with glare.  SEEK IMMEDIATE MEDICAL CARE IF:   You have  a worsening or sudden vision loss.   You notice redness, swelling, or increasing pain in the eye.   You have a fever.  Document Released: 03/09/2005 Document Revised: 02/26/2011 Document Reviewed: 10/31/2010 [facility] Patient Information 2012 [place].PATIENT INSTRUCTIONS POST-ANESTHESIA  IMMEDIATELY FOLLOWING SURGERY:  Do not drive or operate machinery for the first twenty four hours after surgery.  Do not make any important decisions for twenty four hours after surgery or while taking narcotic pain medications or sedatives.  If you develop intractable nausea and vomiting or a severe headache please notify your doctor immediately.  FOLLOW-UP:  Please make an appointment with your surgeon as instructed. You do not need to follow up with anesthesia unless specifically instructed to do so.  WOUND CARE INSTRUCTIONS (if applicable):  Keep a dry clean dressing on the anesthesia/puncture wound site if there is drainage.  Once the wound has quit draining you may leave it open to air.  Generally you should leave the bandage intact for twenty four hours unless there is drainage.  If the epidural site drains for more than 36-48 hours please call the anesthesia department.  QUESTIONS?:  Please feel free to call your physician or the hospital operator if you have any questions, and they will be happy to assist you.

## 2017-05-31 DIAGNOSIS — E785 Hyperlipidemia, unspecified: Secondary | ICD-10-CM | POA: Diagnosis not present

## 2017-05-31 DIAGNOSIS — R911 Solitary pulmonary nodule: Secondary | ICD-10-CM | POA: Diagnosis not present

## 2017-05-31 DIAGNOSIS — K7460 Unspecified cirrhosis of liver: Secondary | ICD-10-CM | POA: Diagnosis not present

## 2017-05-31 DIAGNOSIS — E119 Type 2 diabetes mellitus without complications: Secondary | ICD-10-CM | POA: Diagnosis not present

## 2017-05-31 DIAGNOSIS — Z794 Long term (current) use of insulin: Secondary | ICD-10-CM | POA: Diagnosis not present

## 2017-05-31 DIAGNOSIS — K520 Gastroenteritis and colitis due to radiation: Secondary | ICD-10-CM | POA: Diagnosis not present

## 2017-05-31 DIAGNOSIS — I712 Thoracic aortic aneurysm, without rupture: Secondary | ICD-10-CM | POA: Diagnosis not present

## 2017-05-31 DIAGNOSIS — Z923 Personal history of irradiation: Secondary | ICD-10-CM | POA: Diagnosis not present

## 2017-05-31 DIAGNOSIS — Z90710 Acquired absence of both cervix and uterus: Secondary | ICD-10-CM | POA: Diagnosis not present

## 2017-05-31 DIAGNOSIS — Z79899 Other long term (current) drug therapy: Secondary | ICD-10-CM | POA: Diagnosis not present

## 2017-05-31 DIAGNOSIS — I10 Essential (primary) hypertension: Secondary | ICD-10-CM | POA: Diagnosis not present

## 2017-05-31 DIAGNOSIS — Z8542 Personal history of malignant neoplasm of other parts of uterus: Secondary | ICD-10-CM | POA: Diagnosis not present

## 2017-05-31 DIAGNOSIS — Z7984 Long term (current) use of oral hypoglycemic drugs: Secondary | ICD-10-CM | POA: Diagnosis not present

## 2017-05-31 DIAGNOSIS — K760 Fatty (change of) liver, not elsewhere classified: Secondary | ICD-10-CM | POA: Diagnosis not present

## 2017-05-31 DIAGNOSIS — K7469 Other cirrhosis of liver: Secondary | ICD-10-CM | POA: Diagnosis not present

## 2017-05-31 DIAGNOSIS — E663 Overweight: Secondary | ICD-10-CM | POA: Diagnosis not present

## 2017-05-31 DIAGNOSIS — Z1159 Encounter for screening for other viral diseases: Secondary | ICD-10-CM | POA: Diagnosis not present

## 2017-05-31 DIAGNOSIS — Z6834 Body mass index (BMI) 34.0-34.9, adult: Secondary | ICD-10-CM | POA: Diagnosis not present

## 2017-06-01 ENCOUNTER — Encounter (HOSPITAL_COMMUNITY): Payer: Self-pay

## 2017-06-01 ENCOUNTER — Encounter (HOSPITAL_COMMUNITY)
Admission: RE | Admit: 2017-06-01 | Discharge: 2017-06-01 | Disposition: A | Discharge status: Home / Self Care | Payer: Medicare Other | Source: Ambulatory Visit | Attending: Ophthalmology | Admitting: Ophthalmology

## 2017-06-01 DIAGNOSIS — K7460 Unspecified cirrhosis of liver: Secondary | ICD-10-CM | POA: Diagnosis not present

## 2017-06-01 DIAGNOSIS — F329 Major depressive disorder, single episode, unspecified: Secondary | ICD-10-CM | POA: Diagnosis not present

## 2017-06-01 DIAGNOSIS — F419 Anxiety disorder, unspecified: Secondary | ICD-10-CM | POA: Diagnosis not present

## 2017-06-01 DIAGNOSIS — E1136 Type 2 diabetes mellitus with diabetic cataract: Secondary | ICD-10-CM | POA: Diagnosis not present

## 2017-06-01 DIAGNOSIS — I10 Essential (primary) hypertension: Secondary | ICD-10-CM | POA: Diagnosis not present

## 2017-06-01 DIAGNOSIS — Z794 Long term (current) use of insulin: Secondary | ICD-10-CM | POA: Diagnosis not present

## 2017-06-01 DIAGNOSIS — Z79899 Other long term (current) drug therapy: Secondary | ICD-10-CM | POA: Diagnosis not present

## 2017-06-01 HISTORY — DX: Depression, unspecified: F32.A

## 2017-06-01 HISTORY — DX: Unspecified osteoarthritis, unspecified site: M19.90

## 2017-06-01 HISTORY — DX: Unspecified cirrhosis of liver: K74.60

## 2017-06-01 HISTORY — DX: Unspecified convulsions: R56.9

## 2017-06-01 HISTORY — DX: Major depressive disorder, single episode, unspecified: F32.9

## 2017-06-01 HISTORY — DX: Headache: R51

## 2017-06-01 LAB — CBC WITH DIFFERENTIAL/PLATELET
Basophils Absolute: 0 10*3/uL (ref 0.0–0.1)
Basophils Relative: 1 %
Eosinophils Absolute: 0.1 10*3/uL (ref 0.0–0.7)
Eosinophils Relative: 3 %
HCT: 35.5 % — ABNORMAL LOW (ref 36.0–46.0)
Hemoglobin: 11.5 g/dL — ABNORMAL LOW (ref 12.0–15.0)
Lymphocytes Relative: 27 %
Lymphs Abs: 0.9 10*3/uL (ref 0.7–4.0)
MCH: 27.1 pg (ref 26.0–34.0)
MCHC: 32.4 g/dL (ref 30.0–36.0)
MCV: 83.5 fL (ref 78.0–100.0)
Monocytes Absolute: 0.3 10*3/uL (ref 0.1–1.0)
Monocytes Relative: 7 %
Neutro Abs: 2.1 10*3/uL (ref 1.7–7.7)
Neutrophils Relative %: 62 %
Platelets: 80 10*3/uL — ABNORMAL LOW (ref 150–400)
RBC: 4.25 MIL/uL (ref 3.87–5.11)
RDW: 14.2 % (ref 11.5–15.5)
WBC: 3.4 10*3/uL — ABNORMAL LOW (ref 4.0–10.5)

## 2017-06-01 LAB — BASIC METABOLIC PANEL
Anion gap: 13 (ref 5–15)
BUN: 12 mg/dL (ref 6–20)
CO2: 27 mmol/L (ref 22–32)
Calcium: 9.2 mg/dL (ref 8.9–10.3)
Chloride: 94 mmol/L — ABNORMAL LOW (ref 101–111)
Creatinine, Ser: 0.78 mg/dL (ref 0.44–1.00)
GFR calc Af Amer: >60 mL/min (ref >60)
GFR calc non Af Amer: >60 mL/min (ref >60)
Glucose, Bld: 289 mg/dL — ABNORMAL HIGH (ref 65–99)
Potassium: 3.6 mmol/L (ref 3.5–5.1)
Sodium: 134 mmol/L — ABNORMAL LOW (ref 135–145)

## 2017-06-01 NOTE — Progress Notes (Signed)
EKG grossly abnormal.  Reviewed with Dr Patsey Berthold.  Ok to proceed with cataract surgery, however will need to follow up with a cardiologist.  Kelle Darting at [place] made aware of concerns and EKG faxed.  Patient aware, as well.

## 2017-06-03 MED ORDER — CYCLOPENTOLATE-PHENYLEPHRINE 0.2-1 % OP SOLN
OPHTHALMIC | Status: AC
  Filled 2017-06-03: qty 2

## 2017-06-03 MED ORDER — LIDOCAINE HCL (PF) 1 % IJ SOLN
INTRAMUSCULAR | Status: AC
  Filled 2017-06-03: qty 2

## 2017-06-03 MED ORDER — LIDOCAINE HCL 3.5 % OP GEL
OPHTHALMIC | Status: AC
  Filled 2017-06-03: qty 1

## 2017-06-03 MED ORDER — NEOMYCIN-POLYMYXIN-DEXAMETH 3.5-10000-0.1 OP SUSP
OPHTHALMIC | Status: AC
  Filled 2017-06-03: qty 5

## 2017-06-03 MED ORDER — TETRACAINE HCL 0.5 % OP SOLN
OPHTHALMIC | Status: AC
  Filled 2017-06-03: qty 4

## 2017-06-03 MED ORDER — PHENYLEPHRINE HCL 2.5 % OP SOLN
OPHTHALMIC | Status: AC
  Filled 2017-06-03: qty 15

## 2017-06-04 ENCOUNTER — Ambulatory Visit (HOSPITAL_COMMUNITY)
Admission: RE | Admit: 2017-06-04 | Discharge: 2017-06-04 | Disposition: A | Discharge status: Home / Self Care | Payer: Medicare Other | Source: Ambulatory Visit | Attending: Ophthalmology | Admitting: Ophthalmology

## 2017-06-04 ENCOUNTER — Ambulatory Visit (HOSPITAL_COMMUNITY): Payer: Medicare Other | Admitting: Anesthesiology

## 2017-06-04 ENCOUNTER — Encounter (HOSPITAL_COMMUNITY): Payer: Self-pay | Admitting: *Deleted

## 2017-06-04 ENCOUNTER — Encounter (HOSPITAL_COMMUNITY)
Admission: RE | Disposition: A | Discharge status: Home / Self Care | Payer: Self-pay | Source: Ambulatory Visit | Attending: Ophthalmology

## 2017-06-04 DIAGNOSIS — F419 Anxiety disorder, unspecified: Secondary | ICD-10-CM | POA: Diagnosis not present

## 2017-06-04 DIAGNOSIS — Z79899 Other long term (current) drug therapy: Secondary | ICD-10-CM | POA: Insufficient documentation

## 2017-06-04 DIAGNOSIS — F329 Major depressive disorder, single episode, unspecified: Secondary | ICD-10-CM | POA: Insufficient documentation

## 2017-06-04 DIAGNOSIS — Z794 Long term (current) use of insulin: Secondary | ICD-10-CM | POA: Diagnosis not present

## 2017-06-04 DIAGNOSIS — H2511 Age-related nuclear cataract, right eye: Secondary | ICD-10-CM | POA: Diagnosis not present

## 2017-06-04 DIAGNOSIS — I10 Essential (primary) hypertension: Secondary | ICD-10-CM | POA: Insufficient documentation

## 2017-06-04 DIAGNOSIS — E1136 Type 2 diabetes mellitus with diabetic cataract: Secondary | ICD-10-CM | POA: Diagnosis not present

## 2017-06-04 DIAGNOSIS — K7460 Unspecified cirrhosis of liver: Secondary | ICD-10-CM | POA: Diagnosis not present

## 2017-06-04 DIAGNOSIS — Z9849 Cataract extraction status, unspecified eye: Secondary | ICD-10-CM | POA: Diagnosis not present

## 2017-06-04 DIAGNOSIS — H2512 Age-related nuclear cataract, left eye: Secondary | ICD-10-CM | POA: Diagnosis not present

## 2017-06-04 HISTORY — PX: CATARACT EXTRACTION W/PHACO: SHX586

## 2017-06-04 LAB — GLUCOSE, CAPILLARY: Glucose-Capillary: 86 mg/dL (ref 65–99)

## 2017-06-04 SURGERY — PHACOEMULSIFICATION, CATARACT, WITH IOL INSERTION
Anesthesia: Monitor Anesthesia Care | Site: Eye | Laterality: Left

## 2017-06-04 MED ORDER — LIDOCAINE HCL (PF) 1 % IJ SOLN
INTRAOCULAR | Status: DC | PRN
  Administered 2017-06-04: 1 mL

## 2017-06-04 MED ORDER — FENTANYL CITRATE (PF) 100 MCG/2ML IJ SOLN
INTRAMUSCULAR | Status: AC
  Filled 2017-06-04: qty 2

## 2017-06-04 MED ORDER — TETRACAINE HCL 0.5 % OP SOLN
1.0000 [drp] | OPHTHALMIC | Status: AC
  Administered 2017-06-04 (×3): 1 [drp] via OPHTHALMIC

## 2017-06-04 MED ORDER — SODIUM HYALURONATE 23 MG/ML IO SOLN
INTRAOCULAR | Status: DC | PRN
  Administered 2017-06-04: 0.6 mL via INTRAOCULAR

## 2017-06-04 MED ORDER — MIDAZOLAM HCL 2 MG/2ML IJ SOLN
INTRAMUSCULAR | Status: AC
  Filled 2017-06-04: qty 2

## 2017-06-04 MED ORDER — BSS IO SOLN
INTRAOCULAR | Status: DC | PRN
  Administered 2017-06-04: 15 mL

## 2017-06-04 MED ORDER — MIDAZOLAM HCL 2 MG/2ML IJ SOLN
INTRAMUSCULAR | Status: DC | PRN
  Administered 2017-06-04 (×2): 1 mg via INTRAVENOUS

## 2017-06-04 MED ORDER — FENTANYL CITRATE (PF) 100 MCG/2ML IJ SOLN
25.0000 ug | Freq: Once | INTRAMUSCULAR | Status: AC
  Administered 2017-06-04: 25 ug via INTRAVENOUS

## 2017-06-04 MED ORDER — MIDAZOLAM HCL 2 MG/2ML IJ SOLN
1.0000 mg | INTRAMUSCULAR | Status: AC
  Administered 2017-06-04: 2 mg via INTRAVENOUS

## 2017-06-04 MED ORDER — PROVISC 10 MG/ML IO SOLN
INTRAOCULAR | Status: DC | PRN
  Administered 2017-06-04: 0.85 mL via INTRAOCULAR

## 2017-06-04 MED ORDER — LACTATED RINGERS IV SOLN
INTRAVENOUS | Status: DC
  Administered 2017-06-04: 07:00:00 via INTRAVENOUS

## 2017-06-04 MED ORDER — LIDOCAINE HCL 3.5 % OP GEL
1.0000 "application " | Freq: Once | OPHTHALMIC | Status: AC
  Administered 2017-06-04: 1 via OPHTHALMIC

## 2017-06-04 MED ORDER — EPINEPHRINE PF 1 MG/ML IJ SOLN
INTRAOCULAR | Status: DC | PRN
  Administered 2017-06-04: 500 mL

## 2017-06-04 MED ORDER — CYCLOPENTOLATE-PHENYLEPHRINE 0.2-1 % OP SOLN
1.0000 [drp] | OPHTHALMIC | Status: AC
  Administered 2017-06-04 (×3): 1 [drp] via OPHTHALMIC

## 2017-06-04 MED ORDER — PHENYLEPHRINE HCL 2.5 % OP SOLN
1.0000 [drp] | OPHTHALMIC | Status: AC
  Administered 2017-06-04 (×3): 1 [drp] via OPHTHALMIC

## 2017-06-04 MED ORDER — POVIDONE-IODINE 5 % OP SOLN
OPHTHALMIC | Status: DC | PRN
  Administered 2017-06-04: 1 via OPHTHALMIC

## 2017-06-04 MED ORDER — NEOMYCIN-POLYMYXIN-DEXAMETH 3.5-10000-0.1 OP SUSP
OPHTHALMIC | Status: DC | PRN
  Administered 2017-06-04: 2 [drp] via OPHTHALMIC

## 2017-06-04 SURGICAL SUPPLY — 13 items
CLOTH BEACON ORANGE TIMEOUT ST (SAFETY) ×2 IMPLANT
EYE SHIELD UNIVERSAL CLEAR (GAUZE/BANDAGES/DRESSINGS) ×2 IMPLANT
GLOVE BIOGEL PI IND STRL 6.5 (GLOVE) ×1 IMPLANT
GLOVE BIOGEL PI IND STRL 7.0 (GLOVE) ×1 IMPLANT
GLOVE BIOGEL PI INDICATOR 6.5 (GLOVE) ×1
GLOVE BIOGEL PI INDICATOR 7.0 (GLOVE) ×1
LENS ALC ACRYL/TECN (Ophthalmic Related) ×2 IMPLANT
NEEDLE HYPO 18GX1.5 BLUNT FILL (NEEDLE) ×2 IMPLANT
PAD ARMBOARD 7.5X6 YLW CONV (MISCELLANEOUS) ×2 IMPLANT
SYR TB 1ML LL NO SAFETY (SYRINGE) ×2 IMPLANT
TAPE SURG TRANSPORE 1 IN (GAUZE/BANDAGES/DRESSINGS) ×1 IMPLANT
TAPE SURGICAL TRANSPORE 1 IN (GAUZE/BANDAGES/DRESSINGS) ×1
WATER STERILE IRR 250ML POUR (IV SOLUTION) ×2 IMPLANT

## 2017-06-04 NOTE — Op Note (Signed)
Date of procedure: 06/04/17  Pre-operative diagnosis: Visually significant cataract, Left Eye  Post-operative diagnosis: Visually significant cataract, Left Eye  Procedure: Removal of cataract via phacoemulsification and insertion of intra-ocular lens Wynetta Emery and [employer] PCB00  +24.5D into the capsular bag of the Left Eye  Attending surgeon: Gerda Diss. Catricia Scheerer, MD, MA  Anesthesia: MAC, Topical [place]  Complications: None  Estimated Blood Loss: <47m (minimal)  Specimens: None  Implants: As above  Indications:  Visually significant cataract, Left Eye  Procedure:  The patient was seen and identified in the pre-operative area. The operative eye was identified and dilated.  The operative eye was marked.  Topical anesthesia was administered to the operative eye.     The patient was then to the operative suite and placed in the supine position.  A timeout was performed confirming the patient, procedure to be performed, and all other relevant information.   The patient's face was prepped and draped in the usual fashion for intra-ocular surgery.  A lid speculum was placed into the operative eye and the surgical microscope moved into place and focused.  An inferotemporal paracentesis was created using a 20 gauge paracentesis blade.  Shugarcaine was injected into the anterior chamber.  Viscoelastic was injected into the anterior chamber.  A temporal clear-corneal main wound incision was created using a 2.474mmicrokeratome.  A continuous curvilinear capsulorrhexis was initiated using an irrigating cystitome and completed using capsulorrhexis forceps.  Hydrodissection and hydrodeliniation were performed.  Viscoelastic was injected into the anterior chamber.  A phacoemulsification handpiece and a chopper as a second instrument were used to remove the nucleus and epinucleus. The irrigation/aspiration handpiece was used to remove any remaining cortical material.   The capsular bag was reinflated with  viscoelastic, checked, and found to be intact.  The intraocular lens was inserted into the capsular bag and dialed into place using a Kuglen hook.  The irrigation/aspiration handpiece was used to remove any remaining viscoelastic.  The clear corneal wound and paracentesis wounds were then hydrated and checked with Weck-Cels to be watertight.  The lid-speculum and drape was removed, and the patient's face was cleaned with a wet and dry 4x4.  Maxitrol was instilled in the eye before a clear shield was taped over the eye. The patient was taken to the post-operative care unit in good condition, having tolerated the procedure well.  Post-Op Instructions: [facility] and will receive all other orders and instructions.

## 2017-06-04 NOTE — Anesthesia Preprocedure Evaluation (Signed)
Anesthesia Evaluation  Patient identified by MRN, date of birth, ID band Patient awake    Reviewed: Allergy & Precautions, NPO status , Patient's Chart, lab work & pertinent test results  Airway Mallampati: II  TM Distance: >3 FB Neck ROM: Full    Dental  (+) Teeth Intact   Pulmonary neg pulmonary ROS,    breath sounds clear to auscultation       Cardiovascular hypertension, Pt. on medications  Rhythm:Regular Rate:Normal     Neuro/Psych  Headaches, Seizures -,  PSYCHIATRIC DISORDERS Anxiety Depression    GI/Hepatic negative GI ROS, (+) Cirrhosis       ,   Endo/Other  diabetes, Type 2, Oral Hypoglycemic Agents  Renal/GU negative Renal ROS     Musculoskeletal   Abdominal   Peds  Hematology   Anesthesia Other Findings   Reproductive/Obstetrics                             Anesthesia Physical Anesthesia Plan  ASA: III  Anesthesia Plan: MAC   Post-op Pain Management:    Induction: Intravenous  PONV Risk Score and Plan:   Airway Management Planned: Nasal Cannula  Additional Equipment:   Intra-op Plan:   Post-operative Plan:   Informed Consent: I have reviewed the patients History and Physical, chart, labs and discussed the procedure including the risks, benefits and alternatives for the proposed anesthesia with the patient or authorized representative who has indicated his/her understanding and acceptance.     Plan Discussed with:   Anesthesia Plan Comments:         Anesthesia Quick Evaluation

## 2017-06-04 NOTE — Transfer of Care (Signed)
Immediate Anesthesia Transfer of Care Note  Patient: ABREE ROMICK  Procedure(s) Performed: CATARACT EXTRACTION PHACO AND INTRAOCULAR LENS PLACEMENT (IOC) (Left Eye)  Patient Location: Short Stay  Anesthesia Type:MAC  Level of Consciousness: awake, alert  and patient cooperative  Airway & Oxygen Therapy: Patient Spontanous Breathing  Post-op Assessment: Report given to RN and Post -op Vital signs reviewed and stable  Post vital signs: Reviewed and stable  Last Vitals:  Vitals:   06/04/17 0715 06/04/17 0859  BP: (!) 154/68 (!) 162/65  Pulse: (!) 54 (!) 57  Resp: 18 16  Temp:  36.7 C  SpO2: 100% 97%    Last Pain:  Vitals:   06/04/17 0859  TempSrc: Oral      Patients Stated Pain Goal: 5 (52/84/13 2440)  Complications: No apparent anesthesia complications

## 2017-06-04 NOTE — H&P (Signed)
The H and P was reviewed and updated. The patient was examined.  No changes were found after exam.  The surgical eye was marked.  

## 2017-06-04 NOTE — Discharge Instructions (Signed)
Please discharge patient when stable, will follow up today with Dr. Marisa Hua at the [facility] office immediately following discharge.  Leave shield in place until visit.  All paperwork with discharge instructions will be given at the office.   PATIENT INSTRUCTIONS POST-ANESTHESIA  IMMEDIATELY FOLLOWING SURGERY:  Do not drive or operate machinery for the first twenty four hours after surgery.  Do not make any important decisions for twenty four hours after surgery or while taking narcotic pain medications or sedatives.  If you develop intractable nausea and vomiting or a severe headache please notify your doctor immediately.  FOLLOW-UP:  Please make an appointment with your surgeon as instructed. You do not need to follow up with anesthesia unless specifically instructed to do so.  WOUND CARE INSTRUCTIONS (if applicable):  Keep a dry clean dressing on the anesthesia/puncture wound site if there is drainage.  Once the wound has quit draining you may leave it open to air.  Generally you should leave the bandage intact for twenty four hours unless there is drainage.  If the epidural site drains for more than 36-48 hours please call the anesthesia department.  QUESTIONS?:  Please feel free to call your physician or the hospital operator if you have any questions, and they will be happy to assist you.        Cataract Surgery, Care After Refer to this sheet in the next few weeks. These instructions provide you with information about caring for yourself after your procedure. Your health care provider may also give you more specific instructions. Your treatment has been planned according to current medical practices, but problems sometimes occur. Call your health care provider if you have any problems or questions after your procedure. What can I expect after the procedure? After the procedure, it is common to have:  Itching.  Discomfort.  Fluid discharge.  Sensitivity to light and to  touch.  Bruising.  Follow these instructions at home: [place] your eye every day for signs of infection. Watch for: ? Redness, swelling, or pain. ? Fluid, blood, or pus. ? Warmth. ? Bad smell. Activity  Avoid strenuous activities, such as playing contact sports, for as long as told by your health care provider.  Do not drive or operate heavy machinery until your health care provider approves.  Do not bend or lift heavy objects. Bending increases pressure in the eye. You can walk, climb stairs, and do light household chores.  Ask your health care provider when you can return to work. If you work in a dusty environment, you may be advised to wear protective eyewear for a period of time. General instructions  Take or apply over-the-counter and prescription medicines only as told by your health care provider. This includes eye drops.  Do not touch or rub your eyes.  If you were given a protective shield, wear it as told by your health care provider. If you were not given a protective shield, wear sunglasses as told by your health care provider to protect your eyes.  Keep the area around your eye clean and dry. Avoid swimming or allowing water to hit you directly in the face while showering until told by your health care provider. Keep soap and shampoo out of your eyes.  Do not put a contact lens into the affected eye or eyes until your health care provider approves.  Keep all follow-up visits as told by your health care provider. This is important. Contact a health care provider if:  You have increased bruising around your eye.  You have pain that is not helped with medicine.  You have a fever.  You have redness, swelling, or pain in your eye.  You have fluid, blood, or pus coming from your incision.  Your vision gets worse. Get help right away if:  You have sudden vision loss. This information is not intended to replace advice given to you by your health care  provider. Make sure you discuss any questions you have with your health care provider. Document Released: 09/26/2004 Document Revised: 07/18/2015 Document Reviewed: 01/17/2015 Elsevier Interactive Patient Education  [employer].

## 2017-06-04 NOTE — Anesthesia Postprocedure Evaluation (Signed)
Anesthesia Post Note  Patient: Brittany Russo  Procedure(s) Performed: CATARACT EXTRACTION PHACO AND INTRAOCULAR LENS PLACEMENT ([place]) (Left Eye)  Patient location during evaluation: Short Stay Anesthesia Type: MAC Level of consciousness: awake and alert and patient cooperative Pain management: pain level controlled Vital Signs Assessment: post-procedure vital signs reviewed and stable Respiratory status: spontaneous breathing Cardiovascular status: stable Postop Assessment: no apparent nausea or vomiting Anesthetic complications: no     Last Vitals:  Vitals:   06/04/17 0715 06/04/17 0859  BP: (!) 154/68 (!) 162/65  Pulse: (!) 54 (!) 57  Resp: 18 16  Temp:  36.7 C  SpO2: 100% 97%    Last Pain:  Vitals:   06/04/17 0859  TempSrc: Oral                 Tinley Rought

## 2017-06-04 NOTE — Anesthesia Procedure Notes (Signed)
Procedure Name: MAC Date/Time: 06/04/2017 8:32 AM Performed by: Vista Deck, CRNA Pre-anesthesia Checklist: Patient identified, Emergency Drugs available, Suction available, Timeout performed and Patient being monitored Patient Re-evaluated:Patient Re-evaluated prior to induction Oxygen Delivery Method: Nasal Cannula

## 2017-06-07 ENCOUNTER — Encounter (HOSPITAL_COMMUNITY): Payer: Self-pay | Admitting: Ophthalmology

## 2017-06-08 DIAGNOSIS — E559 Vitamin D deficiency, unspecified: Secondary | ICD-10-CM | POA: Diagnosis not present

## 2017-06-08 DIAGNOSIS — R9431 Abnormal electrocardiogram [ECG] [EKG]: Secondary | ICD-10-CM | POA: Diagnosis not present

## 2017-06-08 DIAGNOSIS — Z6833 Body mass index (BMI) 33.0-33.9, adult: Secondary | ICD-10-CM | POA: Diagnosis not present

## 2017-06-08 DIAGNOSIS — I10 Essential (primary) hypertension: Secondary | ICD-10-CM | POA: Diagnosis not present

## 2017-06-08 DIAGNOSIS — E1165 Type 2 diabetes mellitus with hyperglycemia: Secondary | ICD-10-CM | POA: Diagnosis not present

## 2017-06-08 DIAGNOSIS — D696 Thrombocytopenia, unspecified: Secondary | ICD-10-CM | POA: Diagnosis not present

## 2017-07-08 ENCOUNTER — Encounter (HOSPITAL_COMMUNITY): Payer: Self-pay

## 2017-07-08 ENCOUNTER — Encounter (HOSPITAL_COMMUNITY)
Admission: RE | Admit: 2017-07-08 | Discharge: 2017-07-08 | Disposition: A | Discharge status: Home / Self Care | Payer: Medicare Other | Source: Ambulatory Visit | Attending: Ophthalmology | Admitting: Ophthalmology

## 2017-07-08 DIAGNOSIS — H25811 Combined forms of age-related cataract, right eye: Secondary | ICD-10-CM | POA: Diagnosis not present

## 2017-07-13 DIAGNOSIS — E119 Type 2 diabetes mellitus without complications: Secondary | ICD-10-CM | POA: Diagnosis not present

## 2017-07-13 DIAGNOSIS — R911 Solitary pulmonary nodule: Secondary | ICD-10-CM | POA: Diagnosis not present

## 2017-07-13 DIAGNOSIS — E538 Deficiency of other specified B group vitamins: Secondary | ICD-10-CM | POA: Diagnosis not present

## 2017-07-13 DIAGNOSIS — Z90722 Acquired absence of ovaries, bilateral: Secondary | ICD-10-CM | POA: Diagnosis not present

## 2017-07-13 DIAGNOSIS — Z90710 Acquired absence of both cervix and uterus: Secondary | ICD-10-CM | POA: Diagnosis not present

## 2017-07-13 DIAGNOSIS — I110 Hypertensive heart disease with heart failure: Secondary | ICD-10-CM | POA: Diagnosis not present

## 2017-07-13 DIAGNOSIS — K838 Other specified diseases of biliary tract: Secondary | ICD-10-CM | POA: Diagnosis not present

## 2017-07-13 DIAGNOSIS — J329 Chronic sinusitis, unspecified: Secondary | ICD-10-CM | POA: Diagnosis not present

## 2017-07-13 DIAGNOSIS — E785 Hyperlipidemia, unspecified: Secondary | ICD-10-CM | POA: Diagnosis not present

## 2017-07-13 DIAGNOSIS — K7460 Unspecified cirrhosis of liver: Secondary | ICD-10-CM | POA: Diagnosis not present

## 2017-07-13 DIAGNOSIS — F419 Anxiety disorder, unspecified: Secondary | ICD-10-CM | POA: Diagnosis not present

## 2017-07-13 DIAGNOSIS — D508 Other iron deficiency anemias: Secondary | ICD-10-CM | POA: Diagnosis not present

## 2017-07-13 DIAGNOSIS — Z794 Long term (current) use of insulin: Secondary | ICD-10-CM | POA: Diagnosis not present

## 2017-07-13 DIAGNOSIS — E663 Overweight: Secondary | ICD-10-CM | POA: Diagnosis not present

## 2017-07-13 DIAGNOSIS — M654 Radial styloid tenosynovitis [de Quervain]: Secondary | ICD-10-CM | POA: Diagnosis not present

## 2017-07-13 DIAGNOSIS — I509 Heart failure, unspecified: Secondary | ICD-10-CM | POA: Diagnosis not present

## 2017-07-13 DIAGNOSIS — D696 Thrombocytopenia, unspecified: Secondary | ICD-10-CM | POA: Diagnosis not present

## 2017-07-13 DIAGNOSIS — E871 Hypo-osmolality and hyponatremia: Secondary | ICD-10-CM | POA: Diagnosis not present

## 2017-07-13 DIAGNOSIS — Z6834 Body mass index (BMI) 34.0-34.9, adult: Secondary | ICD-10-CM | POA: Diagnosis not present

## 2017-07-13 DIAGNOSIS — E1165 Type 2 diabetes mellitus with hyperglycemia: Secondary | ICD-10-CM | POA: Diagnosis not present

## 2017-07-13 DIAGNOSIS — Z79899 Other long term (current) drug therapy: Secondary | ICD-10-CM | POA: Diagnosis not present

## 2017-07-13 DIAGNOSIS — K279 Peptic ulcer, site unspecified, unspecified as acute or chronic, without hemorrhage or perforation: Secondary | ICD-10-CM | POA: Diagnosis not present

## 2017-07-13 DIAGNOSIS — K828 Other specified diseases of gallbladder: Secondary | ICD-10-CM | POA: Diagnosis not present

## 2017-07-15 ENCOUNTER — Ambulatory Visit (HOSPITAL_COMMUNITY)
Admission: RE | Admit: 2017-07-15 | Discharge: 2017-07-15 | Disposition: A | Discharge status: Home / Self Care | Payer: Medicare Other | Source: Ambulatory Visit | Attending: Ophthalmology | Admitting: Ophthalmology

## 2017-07-15 ENCOUNTER — Encounter (HOSPITAL_COMMUNITY): Payer: Self-pay

## 2017-07-15 ENCOUNTER — Ambulatory Visit (HOSPITAL_COMMUNITY): Payer: Medicare Other | Admitting: Anesthesiology

## 2017-07-15 ENCOUNTER — Encounter (HOSPITAL_COMMUNITY)
Admission: RE | Disposition: A | Discharge status: Home / Self Care | Payer: Self-pay | Source: Ambulatory Visit | Attending: Ophthalmology

## 2017-07-15 DIAGNOSIS — M1990 Unspecified osteoarthritis, unspecified site: Secondary | ICD-10-CM | POA: Diagnosis not present

## 2017-07-15 DIAGNOSIS — Z79899 Other long term (current) drug therapy: Secondary | ICD-10-CM | POA: Insufficient documentation

## 2017-07-15 DIAGNOSIS — E1136 Type 2 diabetes mellitus with diabetic cataract: Secondary | ICD-10-CM | POA: Insufficient documentation

## 2017-07-15 DIAGNOSIS — H2511 Age-related nuclear cataract, right eye: Secondary | ICD-10-CM | POA: Diagnosis not present

## 2017-07-15 DIAGNOSIS — E1165 Type 2 diabetes mellitus with hyperglycemia: Secondary | ICD-10-CM

## 2017-07-15 DIAGNOSIS — I10 Essential (primary) hypertension: Secondary | ICD-10-CM | POA: Diagnosis not present

## 2017-07-15 DIAGNOSIS — Z7984 Long term (current) use of oral hypoglycemic drugs: Secondary | ICD-10-CM | POA: Diagnosis not present

## 2017-07-15 DIAGNOSIS — Z794 Long term (current) use of insulin: Secondary | ICD-10-CM | POA: Diagnosis not present

## 2017-07-15 DIAGNOSIS — H25811 Combined forms of age-related cataract, right eye: Secondary | ICD-10-CM | POA: Diagnosis not present

## 2017-07-15 HISTORY — PX: CATARACT EXTRACTION W/PHACO: SHX586

## 2017-07-15 LAB — GLUCOSE, CAPILLARY: Glucose-Capillary: 187 mg/dL — ABNORMAL HIGH (ref 65–99)

## 2017-07-15 SURGERY — PHACOEMULSIFICATION, CATARACT, WITH IOL INSERTION
Anesthesia: Monitor Anesthesia Care | Site: Eye | Laterality: Right

## 2017-07-15 MED ORDER — BSS IO SOLN
INTRAOCULAR | Status: DC | PRN
  Administered 2017-07-15: 500 mL

## 2017-07-15 MED ORDER — NEOMYCIN-POLYMYXIN-DEXAMETH 3.5-10000-0.1 OP SUSP
OPHTHALMIC | Status: DC | PRN
  Administered 2017-07-15: 2 [drp] via OPHTHALMIC

## 2017-07-15 MED ORDER — TETRACAINE HCL 0.5 % OP SOLN
1.0000 [drp] | OPHTHALMIC | Status: AC
  Administered 2017-07-15 (×3): 1 [drp] via OPHTHALMIC

## 2017-07-15 MED ORDER — LIDOCAINE HCL (PF) 1 % IJ SOLN
INTRAMUSCULAR | Status: DC | PRN
  Administered 2017-07-15: 1 mL via OPHTHALMIC

## 2017-07-15 MED ORDER — PROVISC 10 MG/ML IO SOLN
INTRAOCULAR | Status: DC | PRN
  Administered 2017-07-15: 0.85 mL via INTRAOCULAR

## 2017-07-15 MED ORDER — LIDOCAINE HCL 3.5 % OP GEL
1.0000 "application " | Freq: Once | OPHTHALMIC | Status: AC
  Administered 2017-07-15: 1 via OPHTHALMIC

## 2017-07-15 MED ORDER — CYCLOPENTOLATE-PHENYLEPHRINE 0.2-1 % OP SOLN
1.0000 [drp] | OPHTHALMIC | Status: AC
  Administered 2017-07-15 (×3): 1 [drp] via OPHTHALMIC

## 2017-07-15 MED ORDER — MIDAZOLAM HCL 5 MG/5ML IJ SOLN
INTRAMUSCULAR | Status: DC | PRN
  Administered 2017-07-15: 2 mg via INTRAVENOUS

## 2017-07-15 MED ORDER — PHENYLEPHRINE HCL 2.5 % OP SOLN
1.0000 [drp] | OPHTHALMIC | Status: AC
  Administered 2017-07-15 (×3): 1 [drp] via OPHTHALMIC

## 2017-07-15 MED ORDER — MIDAZOLAM HCL 2 MG/2ML IJ SOLN
INTRAMUSCULAR | Status: AC
  Filled 2017-07-15: qty 2

## 2017-07-15 MED ORDER — SODIUM HYALURONATE 23 MG/ML IO SOLN
INTRAOCULAR | Status: DC | PRN
  Administered 2017-07-15: 0.6 mL via INTRAOCULAR

## 2017-07-15 MED ORDER — LACTATED RINGERS IV SOLN
INTRAVENOUS | Status: DC
  Administered 2017-07-15: 08:00:00 via INTRAVENOUS

## 2017-07-15 MED ORDER — BSS IO SOLN
INTRAOCULAR | Status: DC | PRN
  Administered 2017-07-15: 15 mL

## 2017-07-15 MED ORDER — POVIDONE-IODINE 5 % OP SOLN
OPHTHALMIC | Status: DC | PRN
  Administered 2017-07-15: 1 via OPHTHALMIC

## 2017-07-15 SURGICAL SUPPLY — 14 items
CLOTH BEACON ORANGE TIMEOUT ST (SAFETY) ×1 IMPLANT
EYE SHIELD UNIVERSAL CLEAR (GAUZE/BANDAGES/DRESSINGS) ×1 IMPLANT
GLOVE BIOGEL PI IND STRL 6.5 (GLOVE) IMPLANT
GLOVE BIOGEL PI INDICATOR 6.5 (GLOVE) ×1
GLOVE EXAM NITRILE MD LF STRL (GLOVE) ×1 IMPLANT
LENS ALC ACRYL/TECN (Ophthalmic Related) ×1 IMPLANT
NDL HYPO 18GX1.5 BLUNT FILL (NEEDLE) IMPLANT
NEEDLE HYPO 18GX1.5 BLUNT FILL (NEEDLE) ×2 IMPLANT
PAD ARMBOARD 7.5X6 YLW CONV (MISCELLANEOUS) ×1 IMPLANT
SYR TB 1ML LL NO SAFETY (SYRINGE) ×1 IMPLANT
TAPE SURG TRANSPORE 1 IN (GAUZE/BANDAGES/DRESSINGS) IMPLANT
TAPE SURGICAL TRANSPORE 1 IN (GAUZE/BANDAGES/DRESSINGS) ×1
VISCOELASTIC ADDITIONAL (OPHTHALMIC RELATED) ×1 IMPLANT
WATER STERILE IRR 250ML POUR (IV SOLUTION) ×1 IMPLANT

## 2017-07-15 NOTE — Transfer of Care (Signed)
Immediate Anesthesia Transfer of Care Note  Patient: Brittany Russo  Procedure(s) Performed: CATARACT EXTRACTION PHACO AND INTRAOCULAR LENS PLACEMENT (IOC) (Right Eye)  Patient Location: PACU  Anesthesia Type:MAC  Level of Consciousness: awake and patient cooperative  Airway & Oxygen Therapy: Patient Spontanous Breathing  Post-op Assessment: Report given to RN, Post -op Vital signs reviewed and stable and Patient moving all extremities  Post vital signs: Reviewed and stable  Last Vitals:  Vitals Value Taken Time  BP    Temp    Pulse    Resp    SpO2      Last Pain:  Vitals:   07/15/17 0727  TempSrc: Oral  PainSc: 0-No pain      Patients Stated Pain Goal: 6 (33/82/50 5397)  Complications: No apparent anesthesia complications

## 2017-07-15 NOTE — Op Note (Signed)
Date of procedure: 07/15/17  Pre-operative diagnosis: Visually significant cataract, Right Eye (H25.811)  Post-operative diagnosis: Visually significant cataract, Right Eye  Procedure: Removal of cataract via phacoemulsification and insertion of intra-ocular lens [employer] PCB00  +25.0D into the capsular bag of the Right Eye  Attending surgeon: Gerda Diss. Rondal Vandevelde, MD, MA  Anesthesia: MAC, Topical Ak[place]  Complications: None  Estimated Blood Loss: <3m (minimal)  Specimens: None  Implants: As above  Indications:  Visually significant cataract, Right Eye  Procedure:  The patient was seen and identified in the pre-operative area. The operative eye was identified and dilated.  The operative eye was marked.  Topical anesthesia was administered to the operative eye.     The patient was then to the operative suite and placed in the supine position.  A timeout was performed confirming the patient, procedure to be performed, and all other relevant information.   The patient's face was prepped and draped in the usual fashion for intra-ocular surgery.  A lid speculum was placed into the operative eye and the surgical microscope moved into place and focused.  A superotemporal paracentesis was created using a 20 gauge paracentesis blade.  Shugarcaine was injected into the anterior chamber.  Viscoelastic was injected into the anterior chamber.  A temporal clear-corneal main wound incision was created using a 2.439mmicrokeratome.  A continuous curvilinear capsulorrhexis was initiated using an irrigating cystitome and completed using capsulorrhexis forceps.  Hydrodissection and hydrodeliniation were performed.  Viscoelastic was injected into the anterior chamber.  A phacoemulsification handpiece and a chopper as a second instrument were used to remove the nucleus and epinucleus. The irrigation/aspiration handpiece was used to remove any remaining cortical material.   The capsular bag was  reinflated with viscoelastic, checked, and found to be intact.  The intraocular lens was inserted into the capsular bag and dialed into place using a Kuglen hook.  The irrigation/aspiration handpiece was used to remove any remaining viscoelastic.  The clear corneal wound and paracentesis wounds were then hydrated and checked with Weck-Cels to be watertight.  The lid-speculum and drape was removed, and the patient's face was cleaned with a wet and dry 4x4.  Maxitrol was instilled in the eye before a clear shield was taped over the eye. The patient was taken to the post-operative care unit in good condition, having tolerated the procedure well.  Post-Op Instructions: [facility] and will receive all other orders and instructions.

## 2017-07-15 NOTE — Anesthesia Postprocedure Evaluation (Signed)
Anesthesia Post Note  Patient: Brittany Russo  Procedure(s) Performed: CATARACT EXTRACTION PHACO AND INTRAOCULAR LENS PLACEMENT (IOC) (Right Eye)  Patient location during evaluation: Short Stay Anesthesia Type: MAC Level of consciousness: awake and alert and patient cooperative Pain management: pain level controlled Vital Signs Assessment: post-procedure vital signs reviewed and stable Respiratory status: spontaneous breathing, nonlabored ventilation and respiratory function stable Cardiovascular status: blood pressure returned to baseline Postop Assessment: no apparent nausea or vomiting Anesthetic complications: no     Last Vitals:  Vitals:   07/15/17 0727  BP: (!) 155/69  Pulse: (!) 56  Resp: 16  Temp: 36.5 C  SpO2: 98%    Last Pain:  Vitals:   07/15/17 0727  TempSrc: Oral  PainSc: 0-No pain                 Yifan Auker J

## 2017-07-15 NOTE — Discharge Instructions (Signed)
Please discharge patient when stable, will follow up today with Dr. Mahum Betten at the [facility] office immediately following discharge.  Leave shield in place until visit.  All paperwork with discharge instructions will be given at the office. ° °

## 2017-07-15 NOTE — H&P (Signed)
The H and P was reviewed and updated. The patient was examined.  No changes were found after exam.  The surgical eye was marked.  

## 2017-07-15 NOTE — Anesthesia Preprocedure Evaluation (Addendum)
Anesthesia Evaluation  Patient identified by MRN, date of birth, ID band Patient awake    Reviewed: Allergy & Precautions, H&P , NPO status , Patient's Chart, lab work & pertinent test results, reviewed documented beta blocker date and time   Airway Mallampati: III  TM Distance: >3 FB Neck ROM: full    Dental no notable dental hx.    Pulmonary neg pulmonary ROS,    Pulmonary exam normal breath sounds clear to auscultation       Cardiovascular Exercise Tolerance: Good hypertension, negative cardio ROS   Rhythm:regular Rate:Normal     Neuro/Psych  Headaches, Seizures -,  Anxiety Depression negative neurological ROS  negative psych ROS   GI/Hepatic negative GI ROS, Neg liver ROS, (+) Cirrhosis       ,   Endo/Other  negative endocrine ROSdiabetes, Insulin Dependent  Renal/GU negative Renal ROS  negative genitourinary   Musculoskeletal  (+) Arthritis ,   Abdominal   Peds  Hematology negative hematology ROS (+)   Anesthesia Other Findings Vaginal cancer (HCC)  Reproductive/Obstetrics negative OB ROS                            Anesthesia Physical Anesthesia Plan  ASA: II  Anesthesia Plan: MAC   Post-op Pain Management:    Induction:   PONV Risk Score and Plan:   Airway Management Planned:   Additional Equipment:   Intra-op Plan:   Post-operative Plan:   Informed Consent: I have reviewed the patients History and Physical, chart, labs and discussed the procedure including the risks, benefits and alternatives for the proposed anesthesia with the patient or authorized representative who has indicated his/her understanding and acceptance.   Dental Advisory Given  Plan Discussed with: CRNA  Anesthesia Plan Comments:        Anesthesia Quick Evaluation

## 2017-07-16 ENCOUNTER — Encounter (HOSPITAL_COMMUNITY): Payer: Self-pay | Admitting: Ophthalmology

## 2017-07-19 DIAGNOSIS — K7460 Unspecified cirrhosis of liver: Secondary | ICD-10-CM | POA: Diagnosis not present

## 2017-07-19 DIAGNOSIS — I700 Atherosclerosis of aorta: Secondary | ICD-10-CM | POA: Diagnosis not present

## 2017-07-19 DIAGNOSIS — R918 Other nonspecific abnormal finding of lung field: Secondary | ICD-10-CM | POA: Diagnosis not present

## 2017-07-19 DIAGNOSIS — I2510 Atherosclerotic heart disease of native coronary artery without angina pectoris: Secondary | ICD-10-CM | POA: Diagnosis not present

## 2017-07-19 DIAGNOSIS — I719 Aortic aneurysm of unspecified site, without rupture: Secondary | ICD-10-CM | POA: Diagnosis not present

## 2017-08-19 ENCOUNTER — Ambulatory Visit: Payer: Self-pay | Admitting: Cardiology

## 2017-08-23 DIAGNOSIS — M654 Radial styloid tenosynovitis [de Quervain]: Secondary | ICD-10-CM | POA: Diagnosis not present

## 2017-08-23 DIAGNOSIS — E538 Deficiency of other specified B group vitamins: Secondary | ICD-10-CM | POA: Diagnosis not present

## 2017-08-23 DIAGNOSIS — D508 Other iron deficiency anemias: Secondary | ICD-10-CM | POA: Diagnosis not present

## 2017-08-25 DIAGNOSIS — E538 Deficiency of other specified B group vitamins: Secondary | ICD-10-CM | POA: Diagnosis not present

## 2017-08-25 DIAGNOSIS — D696 Thrombocytopenia, unspecified: Secondary | ICD-10-CM | POA: Diagnosis not present

## 2017-09-07 ENCOUNTER — Other Ambulatory Visit: Payer: Self-pay

## 2017-09-07 ENCOUNTER — Telehealth: Payer: Self-pay | Admitting: Cardiology

## 2017-09-07 ENCOUNTER — Encounter: Payer: Self-pay | Admitting: Cardiology

## 2017-09-07 ENCOUNTER — Ambulatory Visit (INDEPENDENT_AMBULATORY_CARE_PROVIDER_SITE_OTHER): Payer: Medicare Other | Admitting: Cardiology

## 2017-09-07 ENCOUNTER — Encounter: Payer: Self-pay | Admitting: *Deleted

## 2017-09-07 VITALS — BP 152/81 | HR 58 | Ht 62.0 in | Wt 175.0 lb

## 2017-09-07 DIAGNOSIS — I5189 Other ill-defined heart diseases: Secondary | ICD-10-CM

## 2017-09-07 DIAGNOSIS — I10 Essential (primary) hypertension: Secondary | ICD-10-CM | POA: Diagnosis not present

## 2017-09-07 DIAGNOSIS — I340 Nonrheumatic mitral (valve) insufficiency: Secondary | ICD-10-CM

## 2017-09-07 DIAGNOSIS — R079 Chest pain, unspecified: Secondary | ICD-10-CM | POA: Diagnosis not present

## 2017-09-07 NOTE — Telephone Encounter (Signed)
Pre-cert Verification for the following procedure   Lexiscan scheduled for 09/13/17 at [facility]

## 2017-09-07 NOTE — Progress Notes (Signed)
Clinical Summary Ms. Zody is a 72 y.o.female seen as new consult, referred by [facility] for abnormal echocardiogram    1. Diastolic dysfunction - 05/863 Echo from [facility]: LVEF 55-60%, moderate LVH, grade II diastolic dysfunction - can have some chronic LE edema. Chronic SOB.     2. Mitral regurgitation - mild to moderate by recent echo - chronic SOB and LE edema, likely more related to her diastolic dysfunciton.   3. Abnormal EKG - question about long QTc from computer read EKG. By manual measure 464  4. Iron defiency - followed by hematology  5. Chest pain - occasional sharp/pressure, mid to left chest. Can occur at rest or with exertion. No other associated symptoms. Not positional. Lasts 30 sec to 1 minutes. Occurs 2-3 times a week. Ongoing about 8 years. Recently has increasedi in frequency and severity.  - history of arthritis in sternum.  - cannot  run on treadmilll due to chronic joint pains.    6. HTN - compliant with meds - last week 130/58 she reports, though elevated today    Past Medical History:  Diagnosis Date  . Allergy   . Anxiety   . Arthritis   . Cirrhosis of liver ([place])   . Depression   . Diabetes mellitus without complication ([place])   . Endometrial cancer ([place])   . Headache   . Hyperlipidemia   . Hypertension   . Seizures ([place])   . Thyroid nodule   . Vaginal cancer ([place])      No Known Allergies   Current Outpatient Medications  Medication Sig Dispense Refill  . acetaminophen (TYLENOL) 500 MG tablet Take 1,000 mg by mouth 2 (two) times daily as needed for moderate pain.    [name] ALPRAZolam (XANAX) 0.25 MG tablet Take 0.25 mg by mouth 2 (two) times daily.     [name] atenolol (TENORMIN) 50 MG tablet Take 50 mg by mouth daily.    [name] bismuth subsalicylate (PEPTO BISMOL) 262 MG/15ML suspension Take 30 mLs by mouth every 6 (six) hours as needed for indigestion.    . Calcium Carb-Cholecalciferol (CALCIUM 600+D) 600-800 MG-UNIT TABS Take 1  tablet by mouth daily.    . citalopram (CELEXA) 40 MG tablet Take 40 mg by mouth daily.    . diphenhydrAMINE-PSE-APAP (ALLERGY SINUS HEADACHE RELIEF PO) Take 1 tablet by mouth daily as needed (allergies).    [name] glipiZIDE (GLUCOTROL) 5 MG tablet Take 5 mg by mouth 2 (two) times daily.    . Insulin Glargine (BASAGLAR KWIKPEN [place]) Inject 60 Units into the skin 2 (two) times daily as needed (If blood sugar is high).     . montelukast (SINGULAIR) 10 MG tablet Take 10 mg by mouth at bedtime.    . simvastatin (ZOCOR) 40 MG tablet Take 40 mg by mouth at bedtime.    . sitaGLIPtin-metformin (JANUMET) 50-500 MG per tablet Take 1 tablet by mouth 2 (two) times daily with a meal.    . valsartan-hydrochlorothiazide (DIOVAN-HCT) 320-25 MG per tablet Take 1 tablet by mouth daily.     No current facility-administered medications for this visit.      Past Surgical History:  Procedure Laterality Date  . CATARACT EXTRACTION W/PHACO Left 06/04/2017   Procedure: CATARACT EXTRACTION PHACO AND INTRAOCULAR LENS PLACEMENT (IOC);  Surgeon: Baruch Goldmann, MD;  Location: [facility];  Service: Ophthalmology;  Laterality: Left;  CDE: 3.90  . CATARACT EXTRACTION W/PHACO Right 07/15/2017   Procedure: CATARACT EXTRACTION PHACO AND INTRAOCULAR LENS PLACEMENT (IOC);  Surgeon: Baruch Goldmann, MD;  Location: [facility];  Service: Ophthalmology;  Laterality: Right;  CDE: 7.60  . DILATION AND CURETTAGE OF UTERUS    . histerectomy       No Known Allergies    Family History  Problem Relation Age of Onset  . Heart disease Mother   . Diabetes Mother   . Heart disease Father   . Cancer Father   . Cancer Maternal Grandmother   . Heart disease Maternal Grandfather      Social History Ms. Meacham reports that she has never smoked. She has never used smokeless tobacco. Ms. Grosso reports that she does not drink alcohol.   Review of Systems CONSTITUTIONAL: No weight loss, fever, chills, weakness or fatigue.  HEENT: Eyes: No visual  loss, blurred vision, double vision or yellow sclerae.No hearing loss, sneezing, congestion, runny nose or sore throat.  SKIN: No rash or itching.  CARDIOVASCULAR: per hpi RESPIRATORY: No shortness of breath, cough or sputum.  GASTROINTESTINAL: No anorexia, nausea, vomiting or diarrhea. No abdominal pain or blood.  GENITOURINARY: No burning on urination, no polyuria NEUROLOGICAL: No headache, dizziness, syncope, paralysis, ataxia, numbness or tingling in the extremities. No change in bowel or bladder control.  MUSCULOSKELETAL: No muscle, back pain, joint pain or stiffness.  LYMPHATICS: No enlarged nodes. No history of splenectomy.  PSYCHIATRIC: No history of depression or anxiety.  ENDOCRINOLOGIC: No reports of sweating, cold or heat intolerance. No polyuria or polydipsia.  [name]   Physical Examination Vitals:   09/07/17 1009  BP: (!) 152/81  Pulse: (!) 58  SpO2: 98%   Vitals:   09/07/17 1009  Weight: 175 lb (79.4 kg)  Height: 5' 2"  (1.575 m)    Gen: resting comfortably, no acute distress HEENT: no scleral icterus, pupils equal round and reactive, no palptable cervical adenopathy,  CV: RRR, 2/6 systolic murmur at apex, no jvd Resp: Clear to auscultation bilaterally GI: abdomen is soft, non-tender, non-distended, normal bowel sounds, no hepatosplenomegaly MSK: extremities are warm, no edema.  Skin: warm, no rash Neuro:  no focal deficits Psych: appropriate affect      Assessment and Plan  1. Diastolic dysfunction - grade II diastolic dysfunction by echo - chronic SOB/DOE. Appears euvolemic today. Can consider stronger diuretic in the future if fluid becomes a bigger issue  2. Mitral regurgitation - mild to moderate by echo, conitnue to monitor at this time  3. HTN - elevated today, by her report has been at goal by recent prior checks - continue to monitor at this time, if persistently elevated will adjust meds  4. Chest pain - multiple CAD risk factors. Cannot run on  treadmill due to joint pains - plan for lexiscan    F/u 6 weeks  Arnoldo Lenis, M.D.

## 2017-09-07 NOTE — Patient Instructions (Signed)
Medication Instructions:  Continue all current medications.  Labwork: none  Testing/Procedures:  Your physician has requested that you have a lexiscan myoview. For further information please visit [email or web address]. Please follow instruction sheet, as given.  Office will contact with results via phone or letter.    Follow-Up: 6 weeks   Any Other Special Instructions Will Be Listed Below (If Applicable).  If you need a refill on your cardiac medications before your next appointment, please call your pharmacy.  

## 2017-09-08 DIAGNOSIS — D649 Anemia, unspecified: Secondary | ICD-10-CM | POA: Diagnosis not present

## 2017-09-08 DIAGNOSIS — Z8542 Personal history of malignant neoplasm of other parts of uterus: Secondary | ICD-10-CM | POA: Diagnosis not present

## 2017-09-08 DIAGNOSIS — E1165 Type 2 diabetes mellitus with hyperglycemia: Secondary | ICD-10-CM | POA: Diagnosis not present

## 2017-09-08 DIAGNOSIS — R300 Dysuria: Secondary | ICD-10-CM | POA: Diagnosis not present

## 2017-09-08 DIAGNOSIS — Z0001 Encounter for general adult medical examination with abnormal findings: Secondary | ICD-10-CM | POA: Diagnosis not present

## 2017-09-08 DIAGNOSIS — Z6832 Body mass index (BMI) 32.0-32.9, adult: Secondary | ICD-10-CM | POA: Diagnosis not present

## 2017-09-08 DIAGNOSIS — G4700 Insomnia, unspecified: Secondary | ICD-10-CM | POA: Diagnosis not present

## 2017-09-08 DIAGNOSIS — F419 Anxiety disorder, unspecified: Secondary | ICD-10-CM | POA: Diagnosis not present

## 2017-09-08 DIAGNOSIS — I10 Essential (primary) hypertension: Secondary | ICD-10-CM | POA: Diagnosis not present

## 2017-09-12 ENCOUNTER — Encounter: Payer: Self-pay | Admitting: Cardiology

## 2017-09-13 ENCOUNTER — Encounter (HOSPITAL_COMMUNITY): Payer: Self-pay

## 2017-09-13 ENCOUNTER — Encounter (HOSPITAL_BASED_OUTPATIENT_CLINIC_OR_DEPARTMENT_OTHER)
Admission: RE | Admit: 2017-09-13 | Discharge: 2017-09-13 | Disposition: A | Discharge status: Home / Self Care | Payer: Medicare Other | Source: Ambulatory Visit | Attending: Cardiology | Admitting: Cardiology

## 2017-09-13 ENCOUNTER — Encounter (HOSPITAL_COMMUNITY)
Admission: RE | Admit: 2017-09-13 | Discharge: 2017-09-13 | Disposition: A | Discharge status: Home / Self Care | Payer: Medicare Other | Source: Ambulatory Visit | Attending: Cardiology | Admitting: Cardiology

## 2017-09-13 DIAGNOSIS — R079 Chest pain, unspecified: Secondary | ICD-10-CM | POA: Diagnosis not present

## 2017-09-13 LAB — NM MYOCAR MULTI W/SPECT W/WALL MOTION / EF
LV dias vol: 93 mL (ref 46–106)
LV sys vol: 35 mL
Peak HR: 66 {beats}/min
RATE: 0.4
Rest HR: 56 {beats}/min
SDS: 1
SRS: 1
SSS: 2
TID: 1.17

## 2017-09-13 MED ORDER — REGADENOSON 0.4 MG/5ML IV SOLN
INTRAVENOUS | Status: AC
  Administered 2017-09-13: 0.4 mg via INTRAVENOUS
  Filled 2017-09-13: qty 5

## 2017-09-13 MED ORDER — TECHNETIUM TC 99M TETROFOSMIN IV KIT
10.0000 | PACK | Freq: Once | INTRAVENOUS | Status: AC | PRN
  Administered 2017-09-13: 9.3 via INTRAVENOUS

## 2017-09-13 MED ORDER — TECHNETIUM TC 99M TETROFOSMIN IV KIT
30.0000 | PACK | Freq: Once | INTRAVENOUS | Status: AC | PRN
  Administered 2017-09-13: 27 via INTRAVENOUS

## 2017-09-13 MED ORDER — SODIUM CHLORIDE 0.9% FLUSH
INTRAVENOUS | Status: AC
  Administered 2017-09-13: 10 mL via INTRAVENOUS
  Filled 2017-09-13: qty 10

## 2017-09-13 NOTE — Telephone Encounter (Signed)
-----   Message from Arnoldo Lenis, MD sent at 09/13/2017  4:10 PM EDT ----- BP log reviewed, numbers overall look ok. No changes   Zandra Abts MD

## 2017-09-13 NOTE — Telephone Encounter (Signed)
Called pt. No answer. Left message for pt on answering machine.

## 2017-09-15 ENCOUNTER — Telehealth: Payer: Self-pay | Admitting: *Deleted

## 2017-09-15 NOTE — Telephone Encounter (Signed)
-----   Message from Arnoldo Lenis, MD sent at 09/14/2017 12:40 PM EDT ----- Stress test looks good. No evidence of any significant blockages. Will discuss further at our f/u  Zandra Abts MD

## 2017-09-15 NOTE — Telephone Encounter (Signed)
Pt aware - routed to pcp  

## 2017-10-14 ENCOUNTER — Ambulatory Visit: Payer: Self-pay | Admitting: Cardiology

## 2017-10-18 DIAGNOSIS — Z6831 Body mass index (BMI) 31.0-31.9, adult: Secondary | ICD-10-CM | POA: Diagnosis not present

## 2017-10-18 DIAGNOSIS — J209 Acute bronchitis, unspecified: Secondary | ICD-10-CM | POA: Diagnosis not present

## 2017-10-18 DIAGNOSIS — J019 Acute sinusitis, unspecified: Secondary | ICD-10-CM | POA: Diagnosis not present

## 2017-10-25 ENCOUNTER — Other Ambulatory Visit: Payer: Self-pay

## 2017-11-05 DIAGNOSIS — M1712 Unilateral primary osteoarthritis, left knee: Secondary | ICD-10-CM | POA: Diagnosis not present

## 2017-11-05 DIAGNOSIS — M25561 Pain in right knee: Secondary | ICD-10-CM | POA: Diagnosis not present

## 2017-11-05 DIAGNOSIS — M25562 Pain in left knee: Secondary | ICD-10-CM | POA: Diagnosis not present

## 2017-11-05 DIAGNOSIS — M1711 Unilateral primary osteoarthritis, right knee: Secondary | ICD-10-CM | POA: Diagnosis not present

## 2017-11-25 ENCOUNTER — Ambulatory Visit: Payer: Self-pay | Admitting: Cardiology

## 2017-12-21 DIAGNOSIS — E1165 Type 2 diabetes mellitus with hyperglycemia: Secondary | ICD-10-CM | POA: Diagnosis not present

## 2017-12-21 DIAGNOSIS — F419 Anxiety disorder, unspecified: Secondary | ICD-10-CM | POA: Diagnosis not present

## 2017-12-21 DIAGNOSIS — E876 Hypokalemia: Secondary | ICD-10-CM | POA: Diagnosis not present

## 2017-12-21 DIAGNOSIS — G4700 Insomnia, unspecified: Secondary | ICD-10-CM | POA: Diagnosis not present

## 2017-12-21 DIAGNOSIS — I10 Essential (primary) hypertension: Secondary | ICD-10-CM | POA: Diagnosis not present

## 2017-12-21 DIAGNOSIS — Z6832 Body mass index (BMI) 32.0-32.9, adult: Secondary | ICD-10-CM | POA: Diagnosis not present

## 2017-12-21 DIAGNOSIS — E782 Mixed hyperlipidemia: Secondary | ICD-10-CM | POA: Diagnosis not present

## 2017-12-22 DIAGNOSIS — E538 Deficiency of other specified B group vitamins: Secondary | ICD-10-CM | POA: Diagnosis not present

## 2017-12-22 DIAGNOSIS — D696 Thrombocytopenia, unspecified: Secondary | ICD-10-CM | POA: Diagnosis not present

## 2017-12-27 DIAGNOSIS — K7460 Unspecified cirrhosis of liver: Secondary | ICD-10-CM | POA: Diagnosis not present

## 2017-12-27 DIAGNOSIS — E538 Deficiency of other specified B group vitamins: Secondary | ICD-10-CM | POA: Diagnosis not present

## 2017-12-27 DIAGNOSIS — R638 Other symptoms and signs concerning food and fluid intake: Secondary | ICD-10-CM | POA: Diagnosis not present

## 2017-12-27 DIAGNOSIS — M654 Radial styloid tenosynovitis [de Quervain]: Secondary | ICD-10-CM | POA: Diagnosis not present

## 2017-12-27 DIAGNOSIS — D696 Thrombocytopenia, unspecified: Secondary | ICD-10-CM | POA: Diagnosis not present

## 2017-12-27 DIAGNOSIS — D500 Iron deficiency anemia secondary to blood loss (chronic): Secondary | ICD-10-CM | POA: Diagnosis not present

## 2017-12-30 DIAGNOSIS — Z23 Encounter for immunization: Secondary | ICD-10-CM | POA: Diagnosis not present

## 2017-12-31 ENCOUNTER — Telehealth: Payer: Self-pay | Admitting: *Deleted

## 2017-12-31 NOTE — Telephone Encounter (Signed)
Cough is not a common side effects of diovan/hct, unlikely to be the issue. From literature review can occur in about 3% of people with the valsartan portion of the medicine. Candesartan is reasonable option to try, not sure there will be a difference but the only way to find out about medication side effects is to try changing the meds and see if symptoms improve so Im ok with it   Zandra Abts MD

## 2017-12-31 NOTE — Telephone Encounter (Signed)
Per friend Hardin Negus RN), patient has been coughing uncontrollably for more than a year. Saw oncologist yesterday and suggested that patient stop diovan/hct 320/25 mg d/t cough. PCP contacted and ordered candesartan without the hct. Patient is requesting that her cardiologist change her BP medication to something different or advice if atacand is appropriate.

## 2017-12-31 NOTE — Telephone Encounter (Signed)
Husband Chloey Ricard) informed and verbalized understanding of plan.

## 2018-01-12 DIAGNOSIS — R921 Mammographic calcification found on diagnostic imaging of breast: Secondary | ICD-10-CM | POA: Diagnosis not present

## 2018-01-18 ENCOUNTER — Encounter: Payer: Self-pay | Admitting: Cardiology

## 2018-01-18 ENCOUNTER — Ambulatory Visit (INDEPENDENT_AMBULATORY_CARE_PROVIDER_SITE_OTHER): Payer: Medicare Other | Admitting: Cardiology

## 2018-01-18 VITALS — BP 152/78 | HR 74 | Ht 62.0 in | Wt 179.0 lb

## 2018-01-18 DIAGNOSIS — R0789 Other chest pain: Secondary | ICD-10-CM | POA: Diagnosis not present

## 2018-01-18 NOTE — Progress Notes (Signed)
Clinical Summary Ms. Bies is a 72 y.o.female seen today for follow up of the following medical problems. This is a focused visit on recent chest pain and recent stress test.     1. Chest pain - occasional sharp/pressure, mid to left chest. Can occur at rest or with exertion. No other associated symptoms. Not positional. Lasts 30 sec to 1 minutes. Occurs 2-3 times a week. Ongoing about 8 years. Recently has increasedi in frequency and severity.  - history of arthritis in sternum.  - cannot  run on treadmilll due to chronic joint pains.   08/2017 nuclear stress without ischemia - symptoms improved since last visit.    2. Cough - changed from valsartan/HCTZ to candesartan by pcp due to cough. From literature review can see 3% of cough with ARB.   Past Medical History:  Diagnosis Date  . Allergy   . Anxiety   . Arthritis   . Cirrhosis of liver ([place])   . Depression   . Diabetes mellitus without complication ([place])   . Endometrial cancer ([place])   . Headache   . Hyperlipidemia   . Hypertension   . Seizures ([place])   . Thyroid nodule   . Vaginal cancer ([place])      No Known Allergies   Current Outpatient Medications  Medication Sig Dispense Refill  . ALPRAZolam (XANAX) 0.25 MG tablet Take 0.25 mg by mouth 2 (two) times daily.     [name] atenolol (TENORMIN) 50 MG tablet Take 50 mg by mouth daily.    [name] bismuth subsalicylate (PEPTO BISMOL) 262 MG/15ML suspension Take 30 mLs by mouth every 6 (six) hours as needed for indigestion.    . Calcium Carb-Cholecalciferol (CALCIUM 600+D) 600-800 MG-UNIT TABS Take 1 tablet by mouth daily.    . citalopram (CELEXA) 40 MG tablet Take 40 mg by mouth daily.    . Cyanocobalamin 1000 MCG/ML KIT Inject as directed.    . diphenhydrAMINE-PSE-APAP (ALLERGY SINUS HEADACHE RELIEF PO) Take 1 tablet by mouth daily as needed (allergies).    . Ferrous Gluconate (IRON 27 PO) Take by mouth.    [name] glipiZIDE (GLUCOTROL) 5 MG tablet Take 5 mg by mouth 2 (two)  times daily.    [name] ibuprofen (ADVIL,MOTRIN) 200 MG tablet Take 200 mg by mouth every 6 (six) hours as needed.    . Insulin Glargine (BASAGLAR KWIKPEN [place]) Inject 60 Units into the skin 2 (two) times daily as needed (If blood sugar is high).     . montelukast (SINGULAIR) 10 MG tablet Take 10 mg by mouth at bedtime.    . Omega-3 Fatty Acids (OMEGA-3 1450 PO) Take by mouth.    . simvastatin (ZOCOR) 40 MG tablet Take 40 mg by mouth at bedtime.    . sitaGLIPtin-metformin (JANUMET) 50-500 MG per tablet Take 1 tablet by mouth 2 (two) times daily with a meal.    . valsartan-hydrochlorothiazide (DIOVAN-HCT) 320-25 MG per tablet Take 1 tablet by mouth daily.     No current facility-administered medications for this visit.      Past Surgical History:  Procedure Laterality Date  . CATARACT EXTRACTION W/PHACO Left 06/04/2017   Procedure: CATARACT EXTRACTION PHACO AND INTRAOCULAR LENS PLACEMENT (IOC);  Surgeon: Baruch Goldmann, MD;  Location: [facility];  Service: Ophthalmology;  Laterality: Left;  CDE: 3.90  . CATARACT EXTRACTION W/PHACO Right 07/15/2017   Procedure: CATARACT EXTRACTION PHACO AND INTRAOCULAR LENS PLACEMENT (IOC);  Surgeon: Baruch Goldmann, MD;  Location: [facility];  Service: Ophthalmology;  Laterality: Right;  CDE: 7.60  . DILATION AND CURETTAGE OF UTERUS    . histerectomy       No Known Allergies    Family History  Problem Relation Age of Onset  . Heart disease Mother   . Diabetes Mother   . Heart disease Father   . Cancer Father   . Cancer Maternal Grandmother   . Heart disease Maternal Grandfather      Social History Ms. Shrestha reports that she has never smoked. She has never used smokeless tobacco. Ms. Antenucci reports that she does not drink alcohol.   Review of Systems CONSTITUTIONAL: No weight loss, fever, chills, weakness or fatigue.  HEENT: Eyes: No visual loss, blurred vision, double vision or yellow sclerae.No hearing loss, sneezing, congestion, runny nose or sore  throat.  SKIN: No rash or itching.  CARDIOVASCULAR: per hpi RESPIRATORY: +cough GASTROINTESTINAL: No anorexia, nausea, vomiting or diarrhea. No abdominal pain or blood.  GENITOURINARY: No burning on urination, no polyuria NEUROLOGICAL: No headache, dizziness, syncope, paralysis, ataxia, numbness or tingling in the extremities. No change in bowel or bladder control.  MUSCULOSKELETAL: No muscle, back pain, joint pain or stiffness.  LYMPHATICS: No enlarged nodes. No history of splenectomy.  PSYCHIATRIC: No history of depression or anxiety.  ENDOCRINOLOGIC: No reports of sweating, cold or heat intolerance. No polyuria or polydipsia.  [name]   Physical Examination Vitals:   01/18/18 1407  BP: (!) 152/78  Pulse: 74  SpO2: 97%   Vitals:   01/18/18 1407  Weight: 179 lb (81.2 kg)  Height: _0  (1.575 m)    Gen: resting comfortably, no acute distress HEENT: no scleral icterus, pupils equal round and reactive, no palptable cervical adenopathy,  CV: RRR, no m/r/g, no jvd Resp: Clear to auscultation bilaterally GI: abdomen is soft, non-tender, non-distended, normal bowel sounds, no hepatosplenomegaly MSK: extremities are warm, no edema.  Skin: warm, no rash Neuro:  no focal deficits Psych: appropriate affect   Diagnostic Studies  08/2017 nuclear stress  T wave inversion was noted during stress in the II, III, aVF, V5 and V6 leads.  The study is normal.  This is a low risk study.  The left ventricular ejection fraction is normal (55-65%).   Assessment and Plan  1. Chest pain - symptoms improved, recent stress test without ischemia - no further workup at this time.        Arnoldo Lenis, M.D

## 2018-01-18 NOTE — Patient Instructions (Signed)

## 2018-01-19 ENCOUNTER — Encounter: Payer: Self-pay | Admitting: Cardiology

## 2018-02-04 DIAGNOSIS — Z6832 Body mass index (BMI) 32.0-32.9, adult: Secondary | ICD-10-CM | POA: Diagnosis not present

## 2018-02-04 DIAGNOSIS — J209 Acute bronchitis, unspecified: Secondary | ICD-10-CM | POA: Diagnosis not present

## 2018-02-04 DIAGNOSIS — J019 Acute sinusitis, unspecified: Secondary | ICD-10-CM | POA: Diagnosis not present

## 2018-02-10 DIAGNOSIS — L0232 Furuncle of buttock: Secondary | ICD-10-CM | POA: Diagnosis not present

## 2018-02-10 DIAGNOSIS — R609 Edema, unspecified: Secondary | ICD-10-CM | POA: Diagnosis not present

## 2018-02-10 DIAGNOSIS — J019 Acute sinusitis, unspecified: Secondary | ICD-10-CM | POA: Diagnosis not present

## 2018-02-10 DIAGNOSIS — J0101 Acute recurrent maxillary sinusitis: Secondary | ICD-10-CM | POA: Diagnosis not present

## 2018-02-10 DIAGNOSIS — Z6833 Body mass index (BMI) 33.0-33.9, adult: Secondary | ICD-10-CM | POA: Diagnosis not present

## 2018-02-10 DIAGNOSIS — I10 Essential (primary) hypertension: Secondary | ICD-10-CM | POA: Diagnosis not present

## 2018-02-10 DIAGNOSIS — J209 Acute bronchitis, unspecified: Secondary | ICD-10-CM | POA: Diagnosis not present

## 2018-02-16 DIAGNOSIS — I700 Atherosclerosis of aorta: Secondary | ICD-10-CM | POA: Diagnosis not present

## 2018-02-16 DIAGNOSIS — R05 Cough: Secondary | ICD-10-CM | POA: Diagnosis not present

## 2018-02-16 DIAGNOSIS — K7460 Unspecified cirrhosis of liver: Secondary | ICD-10-CM | POA: Diagnosis not present

## 2018-02-16 DIAGNOSIS — K766 Portal hypertension: Secondary | ICD-10-CM | POA: Diagnosis not present

## 2018-02-16 DIAGNOSIS — R918 Other nonspecific abnormal finding of lung field: Secondary | ICD-10-CM | POA: Diagnosis not present

## 2018-02-16 DIAGNOSIS — I712 Thoracic aortic aneurysm, without rupture: Secondary | ICD-10-CM | POA: Diagnosis not present

## 2018-02-22 DIAGNOSIS — Z6832 Body mass index (BMI) 32.0-32.9, adult: Secondary | ICD-10-CM | POA: Diagnosis not present

## 2018-02-22 DIAGNOSIS — R05 Cough: Secondary | ICD-10-CM | POA: Diagnosis not present

## 2018-02-23 DIAGNOSIS — Z111 Encounter for screening for respiratory tuberculosis: Secondary | ICD-10-CM | POA: Diagnosis not present

## 2018-03-03 ENCOUNTER — Encounter: Payer: Self-pay | Admitting: Pulmonary Disease

## 2018-03-03 ENCOUNTER — Ambulatory Visit (INDEPENDENT_AMBULATORY_CARE_PROVIDER_SITE_OTHER): Payer: Medicare Other | Admitting: Pulmonary Disease

## 2018-03-03 VITALS — BP 142/68 | HR 57 | Ht 62.0 in | Wt 179.6 lb

## 2018-03-03 DIAGNOSIS — J42 Unspecified chronic bronchitis: Secondary | ICD-10-CM

## 2018-03-03 MED ORDER — ALBUTEROL SULFATE HFA 108 (90 BASE) MCG/ACT IN AERS: 2.0000 | INHALATION_SPRAY | RESPIRATORY_TRACT | 3 refills | Status: DC | PRN

## 2018-03-03 MED ORDER — GUAIFENESIN-CODEINE 100-10 MG/5ML PO SYRP: 5.0000 mL | ORAL_SOLUTION | Freq: Three times a day (TID) | ORAL | 0 refills | Status: DC | PRN

## 2018-03-03 NOTE — Progress Notes (Signed)
Synopsis: Referred in 02/2018 for abnormal CT and chronic cough  Subjective:   PATIENT ID: Brittany Russo: female DOB: [birth year], MRN: [id number]   HPI  Chief Complaint  Patient presents with  . Consult    nodule in right lung and chronic prod cough (white phlegm) for at least 3 months.  cough is exhausting and cannot sleep   Brittany Russo is a 72 year old never smoker with DM2, HTN and HLD who presents as new consult for chronic cough and abnormal CT.  She reports several month hx of cough that has been gradually worsening. For work-up she had chest CT on 02/16/18 that demonstrated lung nodule during that time. Her cough is hacking and productive. It occurs during the day and the night. Cough syrup with codeine has alleviated her symptoms for a few days before the symptoms return. Cough worsens laying down so she sleeps in a recliner. She has been treated with multiple courses of antibiotics x2 and prednisone x1. Her prednisone has helped with mucous production but not with the cough. Reports chills and night sweats. Denies fevers. No weight loss or appetite. Associated symptoms include shortness of breath. She was wheezy initially but this has resolved. Reports chronic allergies and sinus pain, headaches. On montelukast. Tried inhalers, antibiotics, and OTC without relief. Stopped valsartan but still had cough. Tried reflux medications x 3 days without relief.  Allergic to dustmites.   Environmental exposures: Previously worked at a Emergency planning/management officer x 1 year.   I have personally reviewed patient's past medical/family/social history, allergies, current medications.  Past Medical History:  Diagnosis Date  . Allergy   . Anxiety   . Arthritis   . Cirrhosis of liver ([place])   . Depression   . Diabetes mellitus without complication ([place])   . Endometrial cancer ([place])   . Headache   . Hyperlipidemia   . Hypertension   . Seizures ([place])   . Thyroid nodule   . Vaginal cancer ([place])       Family History  Problem Relation Age of Onset  . Heart disease Mother   . Diabetes Mother   . Heart disease Father   . Cancer Father   . Cancer Maternal Grandmother   . Heart disease Maternal Grandfather      Social History   Occupational History  . Not on file  Tobacco Use  . Smoking status: Never Smoker  . Smokeless tobacco: Never Used  Substance and Sexual Activity  . Alcohol use: No  . Drug use: No  . Sexual activity: Never    No Known Allergies   Outpatient Medications Prior to Visit  Medication Sig Dispense Refill  . ALPRAZolam (XANAX) 0.25 MG tablet Take 0.25 mg by mouth 2 (two) times daily.     [name] atenolol (TENORMIN) 50 MG tablet Take 50 mg by mouth daily.    [name] bismuth subsalicylate (PEPTO BISMOL) 262 MG/15ML suspension Take 30 mLs by mouth every 6 (six) hours as needed for indigestion.    . Calcium Carb-Cholecalciferol (CALCIUM 600+D) 600-800 MG-UNIT TABS Take 1 tablet by mouth daily.    . citalopram (CELEXA) 40 MG tablet Take 40 mg by mouth daily.    . Cyanocobalamin 1000 MCG/ML KIT Inject as directed.    . Ferrous Gluconate (IRON 27 PO) Take by mouth.    [name] glipiZIDE (GLUCOTROL) 5 MG tablet Take 5 mg by mouth 2 (two) times daily.    [name] ibuprofen (ADVIL,MOTRIN) 200 MG tablet Take 200 mg  by mouth every 6 (six) hours as needed.    . Insulin Glargine (BASAGLAR KWIKPEN [place]) Inject 65 Units into the skin 2 (two) times daily as needed (If blood sugar is high).     . montelukast (SINGULAIR) 10 MG tablet Take 10 mg by mouth at bedtime.    . Omega-3 Fatty Acids (OMEGA-3 1450 PO) Take by mouth.    . simvastatin (ZOCOR) 40 MG tablet Take 40 mg by mouth at bedtime.    . sitaGLIPtin-metformin (JANUMET) 50-500 MG per tablet Take 1 tablet by mouth 2 (two) times daily with a meal.    . candesartan-hydrochlorothiazide (ATACAND HCT) 16-12.5 MG tablet Take 1 tablet by mouth daily.    . diphenhydrAMINE-PSE-APAP (ALLERGY SINUS HEADACHE RELIEF PO) Take 1 tablet by mouth daily as  needed (allergies).     No facility-administered medications prior to visit.     Review of Systems  Constitutional: Positive for chills and diaphoresis. Negative for fever and weight loss.  HENT: Positive for congestion and sinus pain.   Respiratory: Positive for cough, sputum production, shortness of breath and wheezing. Negative for hemoptysis.   Cardiovascular: Negative for chest pain, orthopnea and leg swelling.  Gastrointestinal: Negative for heartburn and nausea.  Genitourinary: Negative for frequency.  Musculoskeletal: Negative for myalgias.  Neurological: Positive for headaches. Negative for weakness.  Endo/Heme/Allergies: Positive for environmental allergies.  Psychiatric/Behavioral: The patient is nervous/anxious.      Objective:  Physical Exam   Vitals:   03/03/18 1431  BP: (!) 142/68  Pulse: (!) 57  SpO2: 96%  Weight: 179 lb 9.6 oz (81.5 kg)  Height: 5' 2" (1.575 m)   SpO2: 96 % O2 Device: None (Room air)  Physical Exam: General: Well-appearing, no acute distress HENT: [place], AT, OP clear, MMM Eyes: EOMI, no scleral icterus Lymph: no cervical lymphadenopathy Respiratory: Clear to auscultation bilaterally.  No crackles, wheezing or rales Cardiovascular: RRR, -M/R/G, no JVD GI: BS+, soft, nontender Extremities:-Edema,-tenderness Neuro: AAO x4, CNII-XII grossly intact Skin: Intact, no rashes or bruising Psych: Anxious, normal affect  Chest imaging: 02/16/18 Report only:  Small nodular GGO opacities in RUL Bilateral nodules with largest 19m in the RUL and nodular soft tissue thickening in apical RUL Cirrhosis and portal HTN 4.2 AAA  PFT: None on file   I have personally reviewed the above labs, images and tests noted above.    Assessment & Plan:   Discussion: 72year old female with DM2, HTN, HLD who presents for chronic cough. Multiple etiologies for cough considered including reflux, asthma and upper airway cough syndrome. No ACEinhibitor on  medication list. Will need PFTs to rule out respiratory contribution and start symptom management.  #Chronic cough --ORDER pulmonary function tests --START Albuterol inhaler 2 puffs every 4 hours as needed for shortness of breath or wheezing --START allergy medication as directed (e.g. certirizine or loratadine) --START Flonase 1 spray per nostril daily --START zantac 150 mg twice a day  --START cough syrup as needed  Follow-up with next week  Orders Placed This Encounter  Procedures  . Pulmonary function test    Standing Status:   Future    Standing Expiration Date:   03/04/2019    Scheduling Instructions:     Please schedule Monday 16th if possible with ROV with ELoanne Drilling   Order Specific Question:   Where should this test be performed?    Answer:   [facility]    Order Specific Question:   Full PFT: includes the following: basic spirometry,  spirometry pre & post bronchodilator, diffusion capacity (DLCO), lung volumes    Answer:   Full PFT   Meds ordered this encounter  Medications  . guaiFENesin-codeine (ROBITUSSIN AC) 100-10 MG/5ML syrup    Sig: Take 5 mLs by mouth 3 (three) times daily as needed for cough.    Dispense:  120 mL    Refill:  0  . albuterol (PROVENTIL HFA;VENTOLIN HFA) 108 (90 Base) MCG/ACT inhaler    Sig: Inhale 2 puffs into the lungs every 4 (four) hours as needed for wheezing or shortness of breath.    Dispense:  1 Inhaler    Refill:  3    No follow-ups on file.   Thank you for choosing [facility] for your health needs!   Rolene Andrades Rodman Pickle, MD [facility] 03/03/2018 4:29 PM  Personal pager: [phone] If unanswered, please page CCM On-call: [phone]

## 2018-03-03 NOTE — Patient Instructions (Addendum)
#  Chronic cough --ORDER pulmonary function tests --START Albuterol inhaler 2 puffs every 4 hours as needed for shortness of breath or wheezing --START allergy medication as directed (e.g. certirizine or loratadine) --START Flonase 1 spray per nostril daily --START zantac 150 mg twice a day  --START cough syrup as needed  Follow-up with next week

## 2018-03-06 ENCOUNTER — Encounter: Payer: Self-pay | Admitting: Pulmonary Disease

## 2018-03-07 ENCOUNTER — Ambulatory Visit: Payer: Self-pay | Admitting: Pulmonary Disease

## 2018-03-07 ENCOUNTER — Encounter: Payer: Self-pay | Admitting: Pulmonary Disease

## 2018-03-07 ENCOUNTER — Telehealth: Payer: Self-pay

## 2018-03-07 ENCOUNTER — Ambulatory Visit (INDEPENDENT_AMBULATORY_CARE_PROVIDER_SITE_OTHER): Payer: Medicare Other | Admitting: Pulmonary Disease

## 2018-03-07 VITALS — BP 126/68 | HR 68

## 2018-03-07 DIAGNOSIS — J42 Unspecified chronic bronchitis: Secondary | ICD-10-CM

## 2018-03-07 LAB — PULMONARY FUNCTION TEST
DL/VA % pred: 100 %
DL/VA: 4.54 ml/min/mmHg/L
DLCO unc % pred: 81 %
DLCO unc: 17.64 ml/min/mmHg
FEF 25-75 Post: 3.44 L/sec
FEF 25-75 Pre: 2.71 L/sec
FEF2575-%Change-Post: 26 %
FEF2575-%Pred-Post: 192 %
FEF2575-%Pred-Pre: 151 %
FEV1-%Change-Post: 3 %
FEV1-%Pred-Post: 95 %
FEV1-%Pred-Pre: 91 %
FEV1-Post: 1.98 L
FEV1-Pre: 1.91 L
FEV1FVC-%Change-Post: 0 %
FEV1FVC-%Pred-Pre: 114 %
FEV6-%Change-Post: 4 %
FEV6-%Pred-Post: 86 %
FEV6-%Pred-Pre: 83 %
FEV6-Post: 2.28 L
FEV6-Pre: 2.19 L
FEV6FVC-%Pred-Post: 104 %
FEV6FVC-%Pred-Pre: 104 %
FVC-%Change-Post: 4 %
FVC-%Pred-Post: 82 %
FVC-%Pred-Pre: 79 %
FVC-Post: 2.28 L
FVC-Pre: 2.19 L
Post FEV1/FVC ratio: 87 %
Post FEV6/FVC ratio: 100 %
Pre FEV1/FVC ratio: 87 %
Pre FEV6/FVC Ratio: 100 %
RV % pred: 109 %
RV: 2.28 L
TLC % pred: 95 %
TLC: 4.52 L

## 2018-03-07 NOTE — Progress Notes (Signed)
Synopsis: Referred in 02/2018 for abnormal CT and chronic cough  Subjective:   PATIENT ID: Brittany Russo: female DOB: [birth year], MRN: [id number]   HPI  Chief Complaint  Patient presents with  . Follow-up    had PFT - here to discuss results. feeling about the same and some cough continues   [name] is a 72 year old never smoker with DM2, HTN and HLD who presents as follow-up chronic cough and abnormal CT.  She was last seen by me on 03/03/18. For that visit, she was prescribed albuterol inhaler, OTC allergy and nasal spray and cough syrup. She feels the cough syrup has significantly improved her cough however the medication made her feel drowsy or "dopey." Denies confusion. She was not able to do Zantac due to medication recall. Her cough has been minimal with little to no sputum production. Associated shortness of breath has improved as well. Reports chronic allergies and sinus pain, headaches.   Environmental exposures: Previously worked at a Emergency planning/management officer x 1 year.   I have personally reviewed patient's past medical/family/social history, allergies, current medications.  Past Medical History:  Diagnosis Date  . Allergy   . Anxiety   . Arthritis   . Cirrhosis of liver ([place])   . Depression   . Diabetes mellitus without complication ([place])   . Endometrial cancer ([place])   . Headache   . Hyperlipidemia   . Hypertension   . Seizures ([place])   . Thyroid nodule   . Vaginal cancer ([place])      Family History  Problem Relation Age of Onset  . Heart disease Mother   . Diabetes Mother   . Heart disease Father   . Cancer Father   . Cancer Maternal Grandmother   . Heart disease Maternal Grandfather      Social History   Occupational History  . Not on file  Tobacco Use  . Smoking status: Never Smoker  . Smokeless tobacco: Never Used  Substance and Sexual Activity  . Alcohol use: No  . Drug use: No  . Sexual activity: Never    No Known Allergies    Outpatient Medications Prior to Visit  Medication Sig Dispense Refill  . albuterol (PROVENTIL HFA;VENTOLIN HFA) 108 (90 Base) MCG/ACT inhaler Inhale 2 puffs into the lungs every 4 (four) hours as needed for wheezing or shortness of breath. 1 Inhaler 3  . ALPRAZolam (XANAX) 0.25 MG tablet Take 0.25 mg by mouth 2 (two) times daily.     [name] atenolol (TENORMIN) 50 MG tablet Take 50 mg by mouth daily.    [name] bismuth subsalicylate (PEPTO BISMOL) 262 MG/15ML suspension Take 30 mLs by mouth every 6 (six) hours as needed for indigestion.    . Calcium Carb-Cholecalciferol (CALCIUM 600+D) 600-800 MG-UNIT TABS Take 1 tablet by mouth daily.    . candesartan-hydrochlorothiazide (ATACAND HCT) 16-12.5 MG tablet Take 1 tablet by mouth daily.    . citalopram (CELEXA) 40 MG tablet Take 40 mg by mouth daily.    . Cyanocobalamin 1000 MCG/ML KIT Inject as directed.    . diphenhydrAMINE-PSE-APAP (ALLERGY SINUS HEADACHE RELIEF PO) Take 1 tablet by mouth daily as needed (allergies).    . Ferrous Gluconate (IRON 27 PO) Take by mouth.    [name] glipiZIDE (GLUCOTROL) 5 MG tablet Take 5 mg by mouth 2 (two) times daily.    [name] guaiFENesin-codeine (ROBITUSSIN AC) 100-10 MG/5ML syrup Take 5 mLs by mouth 3 (three) times daily as needed for cough. [place]  mL 0  . ibuprofen (ADVIL,MOTRIN) 200 MG tablet Take 200 mg by mouth every 6 (six) hours as needed.    . Insulin Glargine (BASAGLAR KWIKPEN [place]) Inject 65 Units into the skin 2 (two) times daily as needed (If blood sugar is high).     . montelukast (SINGULAIR) 10 MG tablet Take 10 mg by mouth at bedtime.    . Omega-3 Fatty Acids (OMEGA-3 1450 PO) Take by mouth.    . simvastatin (ZOCOR) 40 MG tablet Take 40 mg by mouth at bedtime.    . sitaGLIPtin-metformin (JANUMET) 50-500 MG per tablet Take 1 tablet by mouth 2 (two) times daily with a meal.     No facility-administered medications prior to visit.     Review of Systems  Constitutional: Positive for malaise/fatigue. Negative for chills  and fever.  HENT: Positive for congestion and sinus pain.   Respiratory: Positive for cough and shortness of breath. Negative for hemoptysis, sputum production and wheezing.   Cardiovascular: Negative for chest pain, palpitations and leg swelling.  Musculoskeletal: Negative for myalgias.  Neurological: Negative for weakness.  Endo/Heme/Allergies: Positive for environmental allergies.     Objective:  Physical Exam   Vitals:   03/07/18 1521  BP: 126/68  Pulse: 68  SpO2: 95%   SpO2: 95 % O2 Device: None (Room air)  Physical Exam: General: Well-appearing, no acute distress HENT: [place], AT Eyes: EOMI, no scleral icterus Respiratory: Clear to auscultation bilaterally.  No crackles, wheezing or rales Cardiovascular: RRR, -M/R/G, no JVD GI: BS+, soft, nontender Extremities:-Edema,-tenderness Neuro: AAO x4, CNII-XII grossly intact Skin: Intact, no rashes or bruising Psych: Normal mood, normal affect  Chest imaging:  CT Chest 02/16/18 Mild bronchial wall thickening bilaterally RUL apical scarring No infiltrate, effusion or edema in lung fields The patient's images have been independently reviewed by me.   PFT:  03/07/18 - Normal spirometry, lung volumes and DLCO  I have personally reviewed the above labs, images and tests noted above.    Assessment & Plan:   Discussion: 72 year old female with DM2, HTN, HLD who presents for chronic cough. Multiple etiologies for cough considered including reflux, asthma and upper airway cough syndrome. No ACEinhibitor on medication list. PFTs reviewed and are normal. CT Chest reviewed with patient and family. Imaging demonstrates mild bronchial wall thickening. Continue supportive management.  #Chronic Bronchitis/Cough --CONTINUE Albuterol inhaler 2 puffs every 4 hours as needed for shortness of breath or wheezing. If not effective, OK to DISCONTINUE. --CONTINUE Loratadine as directed --CONTINUE Flonase 1 spray per nostril daily --If  symptoms persist at next visit, we will consider empiric treatment for undiagnosed reflux   No orders of the defined types were placed in this encounter.  No orders of the defined types were placed in this encounter.   Return in about 2 months (around 05/08/2018).   I spent >50% of this outpatient visit discussing diagnosis, reviewing imaging and test results and management of condition with patient and family. I addressed questions to the best of my ability. This time reflects my own personal time spent on care for the patient.  Chi Rodman Pickle, MD [facility] 03/07/2018 8:41 PM  Personal pager: [phone] If unanswered, please page CCM On-call: [phone]

## 2018-03-07 NOTE — Patient Instructions (Signed)
#  Chronic Bronchitis/Cough --CONTINUE Albuterol inhaler 2 puffs every 4 hours as needed for shortness of breath or wheezing. If not effective, OK to DISCONTINUE. --CONTINUE Loratadine as directed --CONTINUE Flonase 1 spray per nostril daily --If symptoms persist at next visit, we will consider empiric treatment for undiagnosed reflux  Follow-up in 2 months

## 2018-03-07 NOTE — Telephone Encounter (Signed)
Call made to [facility] for results of CT chest 07/19/2017. Medical records line goes to voicemail even when directing phone option to talk with a staff member.  Left VM to call us or fax report.  Will follow up as needed to obtain records.

## 2018-03-07 NOTE — Progress Notes (Signed)
PFT done today. 

## 2018-03-08 ENCOUNTER — Other Ambulatory Visit: Payer: Self-pay

## 2018-03-08 ENCOUNTER — Ambulatory Visit
Admission: RE | Admit: 2018-03-08 | Discharge: 2018-03-08 | Disposition: A | Discharge status: Home / Self Care | Payer: Self-pay | Source: Ambulatory Visit | Attending: Pulmonary Disease | Admitting: Pulmonary Disease

## 2018-03-08 DIAGNOSIS — J42 Unspecified chronic bronchitis: Secondary | ICD-10-CM

## 2018-03-08 NOTE — Progress Notes (Signed)
Ct

## 2018-03-22 ENCOUNTER — Telehealth: Payer: Self-pay | Admitting: Pulmonary Disease

## 2018-03-22 NOTE — Telephone Encounter (Signed)
Left message for patient to call back  

## 2018-03-24 MED ORDER — HYDROCOD POLST-CPM POLST ER 10-8 MG/5ML PO SUER: 5.0000 mL | Freq: Two times a day (BID) | ORAL | 0 refills | Status: DC

## 2018-03-24 NOTE — Telephone Encounter (Signed)
Attempted to call Patient at home number, unable to leave message. Called her mobile number, spoke to her Husband, [name] (on [place]).  Instructions given. Understanding stated.  Prescription sent to [facility] [place], by Geraldo Pitter, NP.  Nothing further at this time.

## 2018-03-24 NOTE — Telephone Encounter (Signed)
Spoke with pt. States that Dr. Loanne Drilling gave her Guaifenesin-Codeine cough syrup and it is not working for her. Reports that she has been given Tussionex in the past and this works well to control her cough.  Beth - please advise as Dr. Loanne Drilling is not in the office today. Thanks.

## 2018-03-24 NOTE — Telephone Encounter (Signed)
I will give her a 5 day supply for Tussin, no refills  Office note- she has had prescriptions for cough medication prescribed by other providers other than Dr. Loanne Drilling

## 2018-04-08 DIAGNOSIS — M25562 Pain in left knee: Secondary | ICD-10-CM | POA: Diagnosis not present

## 2018-04-08 DIAGNOSIS — M25561 Pain in right knee: Secondary | ICD-10-CM | POA: Diagnosis not present

## 2018-04-08 DIAGNOSIS — M1712 Unilateral primary osteoarthritis, left knee: Secondary | ICD-10-CM | POA: Diagnosis not present

## 2018-04-08 DIAGNOSIS — M1711 Unilateral primary osteoarthritis, right knee: Secondary | ICD-10-CM | POA: Diagnosis not present

## 2018-04-13 DIAGNOSIS — G4700 Insomnia, unspecified: Secondary | ICD-10-CM | POA: Diagnosis not present

## 2018-04-13 DIAGNOSIS — E782 Mixed hyperlipidemia: Secondary | ICD-10-CM | POA: Diagnosis not present

## 2018-04-13 DIAGNOSIS — Z6832 Body mass index (BMI) 32.0-32.9, adult: Secondary | ICD-10-CM | POA: Diagnosis not present

## 2018-04-13 DIAGNOSIS — F419 Anxiety disorder, unspecified: Secondary | ICD-10-CM | POA: Diagnosis not present

## 2018-04-13 DIAGNOSIS — I10 Essential (primary) hypertension: Secondary | ICD-10-CM | POA: Diagnosis not present

## 2018-04-13 DIAGNOSIS — D649 Anemia, unspecified: Secondary | ICD-10-CM | POA: Diagnosis not present

## 2018-04-13 DIAGNOSIS — E876 Hypokalemia: Secondary | ICD-10-CM | POA: Diagnosis not present

## 2018-04-13 DIAGNOSIS — E1165 Type 2 diabetes mellitus with hyperglycemia: Secondary | ICD-10-CM | POA: Diagnosis not present

## 2018-04-27 DIAGNOSIS — M654 Radial styloid tenosynovitis [de Quervain]: Secondary | ICD-10-CM | POA: Diagnosis not present

## 2018-04-27 DIAGNOSIS — R638 Other symptoms and signs concerning food and fluid intake: Secondary | ICD-10-CM | POA: Diagnosis not present

## 2018-04-27 DIAGNOSIS — E538 Deficiency of other specified B group vitamins: Secondary | ICD-10-CM | POA: Diagnosis not present

## 2018-05-02 DIAGNOSIS — E538 Deficiency of other specified B group vitamins: Secondary | ICD-10-CM | POA: Diagnosis not present

## 2018-05-02 DIAGNOSIS — D696 Thrombocytopenia, unspecified: Secondary | ICD-10-CM | POA: Diagnosis not present

## 2018-05-02 DIAGNOSIS — D500 Iron deficiency anemia secondary to blood loss (chronic): Secondary | ICD-10-CM | POA: Diagnosis not present

## 2018-05-16 ENCOUNTER — Ambulatory Visit: Payer: Self-pay | Admitting: Pulmonary Disease

## 2018-05-25 DIAGNOSIS — M25552 Pain in left hip: Secondary | ICD-10-CM | POA: Diagnosis not present

## 2018-05-25 DIAGNOSIS — F17200 Nicotine dependence, unspecified, uncomplicated: Secondary | ICD-10-CM | POA: Diagnosis not present

## 2018-05-25 DIAGNOSIS — F329 Major depressive disorder, single episode, unspecified: Secondary | ICD-10-CM | POA: Diagnosis not present

## 2018-05-25 DIAGNOSIS — M5442 Lumbago with sciatica, left side: Secondary | ICD-10-CM | POA: Diagnosis not present

## 2018-05-25 DIAGNOSIS — E7800 Pure hypercholesterolemia, unspecified: Secondary | ICD-10-CM | POA: Diagnosis not present

## 2018-05-25 DIAGNOSIS — Z794 Long term (current) use of insulin: Secondary | ICD-10-CM | POA: Diagnosis not present

## 2018-05-25 DIAGNOSIS — M5432 Sciatica, left side: Secondary | ICD-10-CM | POA: Diagnosis not present

## 2018-05-25 DIAGNOSIS — J45909 Unspecified asthma, uncomplicated: Secondary | ICD-10-CM | POA: Diagnosis not present

## 2018-05-25 DIAGNOSIS — M545 Low back pain: Secondary | ICD-10-CM | POA: Diagnosis not present

## 2018-05-25 DIAGNOSIS — I10 Essential (primary) hypertension: Secondary | ICD-10-CM | POA: Diagnosis not present

## 2018-05-25 DIAGNOSIS — E7220 Disorder of urea cycle metabolism, unspecified: Secondary | ICD-10-CM | POA: Diagnosis not present

## 2018-05-25 DIAGNOSIS — E119 Type 2 diabetes mellitus without complications: Secondary | ICD-10-CM | POA: Diagnosis not present

## 2018-05-25 DIAGNOSIS — R0602 Shortness of breath: Secondary | ICD-10-CM | POA: Diagnosis not present

## 2018-05-25 DIAGNOSIS — Z8542 Personal history of malignant neoplasm of other parts of uterus: Secondary | ICD-10-CM | POA: Diagnosis not present

## 2018-05-30 DIAGNOSIS — M5432 Sciatica, left side: Secondary | ICD-10-CM | POA: Diagnosis not present

## 2018-05-30 DIAGNOSIS — E7220 Disorder of urea cycle metabolism, unspecified: Secondary | ICD-10-CM | POA: Diagnosis not present

## 2018-05-30 DIAGNOSIS — D649 Anemia, unspecified: Secondary | ICD-10-CM | POA: Diagnosis not present

## 2018-05-30 DIAGNOSIS — Z6834 Body mass index (BMI) 34.0-34.9, adult: Secondary | ICD-10-CM | POA: Diagnosis not present

## 2018-05-30 DIAGNOSIS — R7989 Other specified abnormal findings of blood chemistry: Secondary | ICD-10-CM | POA: Diagnosis not present

## 2018-05-30 DIAGNOSIS — R609 Edema, unspecified: Secondary | ICD-10-CM | POA: Diagnosis not present

## 2018-06-06 DIAGNOSIS — M5440 Lumbago with sciatica, unspecified side: Secondary | ICD-10-CM | POA: Diagnosis not present

## 2018-06-06 DIAGNOSIS — R262 Difficulty in walking, not elsewhere classified: Secondary | ICD-10-CM | POA: Diagnosis not present

## 2018-06-06 DIAGNOSIS — M5416 Radiculopathy, lumbar region: Secondary | ICD-10-CM | POA: Diagnosis not present

## 2018-06-08 ENCOUNTER — Ambulatory Visit (INDEPENDENT_AMBULATORY_CARE_PROVIDER_SITE_OTHER): Payer: Self-pay | Admitting: Internal Medicine

## 2018-06-10 DIAGNOSIS — R7989 Other specified abnormal findings of blood chemistry: Secondary | ICD-10-CM | POA: Diagnosis not present

## 2018-06-10 DIAGNOSIS — F419 Anxiety disorder, unspecified: Secondary | ICD-10-CM | POA: Diagnosis not present

## 2018-06-10 DIAGNOSIS — M5432 Sciatica, left side: Secondary | ICD-10-CM | POA: Diagnosis not present

## 2018-06-13 DIAGNOSIS — M5416 Radiculopathy, lumbar region: Secondary | ICD-10-CM | POA: Diagnosis not present

## 2018-06-13 DIAGNOSIS — M5440 Lumbago with sciatica, unspecified side: Secondary | ICD-10-CM | POA: Diagnosis not present

## 2018-06-13 DIAGNOSIS — R262 Difficulty in walking, not elsewhere classified: Secondary | ICD-10-CM | POA: Diagnosis not present

## 2018-06-13 DIAGNOSIS — E7220 Disorder of urea cycle metabolism, unspecified: Secondary | ICD-10-CM | POA: Diagnosis not present

## 2018-06-14 DIAGNOSIS — M48061 Spinal stenosis, lumbar region without neurogenic claudication: Secondary | ICD-10-CM | POA: Diagnosis not present

## 2018-06-14 DIAGNOSIS — M47816 Spondylosis without myelopathy or radiculopathy, lumbar region: Secondary | ICD-10-CM | POA: Diagnosis not present

## 2018-06-14 DIAGNOSIS — M545 Low back pain: Secondary | ICD-10-CM | POA: Diagnosis not present

## 2018-06-14 DIAGNOSIS — M5442 Lumbago with sciatica, left side: Secondary | ICD-10-CM | POA: Diagnosis not present

## 2018-06-14 DIAGNOSIS — M5136 Other intervertebral disc degeneration, lumbar region: Secondary | ICD-10-CM | POA: Diagnosis not present

## 2018-06-22 DIAGNOSIS — Z6832 Body mass index (BMI) 32.0-32.9, adult: Secondary | ICD-10-CM | POA: Diagnosis not present

## 2018-06-22 DIAGNOSIS — M5416 Radiculopathy, lumbar region: Secondary | ICD-10-CM | POA: Diagnosis not present

## 2018-06-30 DIAGNOSIS — M5416 Radiculopathy, lumbar region: Secondary | ICD-10-CM | POA: Diagnosis not present

## 2018-06-30 DIAGNOSIS — I10 Essential (primary) hypertension: Secondary | ICD-10-CM | POA: Diagnosis not present

## 2018-06-30 DIAGNOSIS — G629 Polyneuropathy, unspecified: Secondary | ICD-10-CM | POA: Diagnosis not present

## 2018-06-30 DIAGNOSIS — E1142 Type 2 diabetes mellitus with diabetic polyneuropathy: Secondary | ICD-10-CM | POA: Diagnosis not present

## 2018-06-30 DIAGNOSIS — M48062 Spinal stenosis, lumbar region with neurogenic claudication: Secondary | ICD-10-CM | POA: Diagnosis not present

## 2018-07-12 DIAGNOSIS — M48062 Spinal stenosis, lumbar region with neurogenic claudication: Secondary | ICD-10-CM | POA: Diagnosis not present

## 2018-07-12 DIAGNOSIS — M5416 Radiculopathy, lumbar region: Secondary | ICD-10-CM | POA: Diagnosis not present

## 2018-08-11 DIAGNOSIS — D649 Anemia, unspecified: Secondary | ICD-10-CM | POA: Diagnosis not present

## 2018-08-11 DIAGNOSIS — E782 Mixed hyperlipidemia: Secondary | ICD-10-CM | POA: Diagnosis not present

## 2018-08-11 DIAGNOSIS — I10 Essential (primary) hypertension: Secondary | ICD-10-CM | POA: Diagnosis not present

## 2018-08-11 DIAGNOSIS — G4700 Insomnia, unspecified: Secondary | ICD-10-CM | POA: Diagnosis not present

## 2018-08-11 DIAGNOSIS — E876 Hypokalemia: Secondary | ICD-10-CM | POA: Diagnosis not present

## 2018-08-11 DIAGNOSIS — R7989 Other specified abnormal findings of blood chemistry: Secondary | ICD-10-CM | POA: Diagnosis not present

## 2018-08-11 DIAGNOSIS — F419 Anxiety disorder, unspecified: Secondary | ICD-10-CM | POA: Diagnosis not present

## 2018-08-11 DIAGNOSIS — Z6835 Body mass index (BMI) 35.0-35.9, adult: Secondary | ICD-10-CM | POA: Diagnosis not present

## 2018-08-11 DIAGNOSIS — M5432 Sciatica, left side: Secondary | ICD-10-CM | POA: Diagnosis not present

## 2018-08-11 DIAGNOSIS — E1165 Type 2 diabetes mellitus with hyperglycemia: Secondary | ICD-10-CM | POA: Diagnosis not present

## 2018-08-25 DIAGNOSIS — D696 Thrombocytopenia, unspecified: Secondary | ICD-10-CM | POA: Diagnosis not present

## 2018-08-25 DIAGNOSIS — D500 Iron deficiency anemia secondary to blood loss (chronic): Secondary | ICD-10-CM | POA: Diagnosis not present

## 2018-08-25 DIAGNOSIS — E538 Deficiency of other specified B group vitamins: Secondary | ICD-10-CM | POA: Diagnosis not present

## 2018-09-05 DIAGNOSIS — M1711 Unilateral primary osteoarthritis, right knee: Secondary | ICD-10-CM | POA: Diagnosis not present

## 2018-09-05 DIAGNOSIS — M25661 Stiffness of right knee, not elsewhere classified: Secondary | ICD-10-CM | POA: Diagnosis not present

## 2018-09-05 DIAGNOSIS — M25561 Pain in right knee: Secondary | ICD-10-CM | POA: Diagnosis not present

## 2018-09-06 DIAGNOSIS — D500 Iron deficiency anemia secondary to blood loss (chronic): Secondary | ICD-10-CM | POA: Diagnosis not present

## 2018-09-06 DIAGNOSIS — R7989 Other specified abnormal findings of blood chemistry: Secondary | ICD-10-CM | POA: Diagnosis not present

## 2018-09-06 DIAGNOSIS — E876 Hypokalemia: Secondary | ICD-10-CM | POA: Diagnosis not present

## 2018-09-06 DIAGNOSIS — E1165 Type 2 diabetes mellitus with hyperglycemia: Secondary | ICD-10-CM | POA: Diagnosis not present

## 2018-09-06 DIAGNOSIS — M5432 Sciatica, left side: Secondary | ICD-10-CM | POA: Diagnosis not present

## 2018-09-06 DIAGNOSIS — F419 Anxiety disorder, unspecified: Secondary | ICD-10-CM | POA: Diagnosis not present

## 2018-09-06 DIAGNOSIS — E782 Mixed hyperlipidemia: Secondary | ICD-10-CM | POA: Diagnosis not present

## 2018-09-06 DIAGNOSIS — D649 Anemia, unspecified: Secondary | ICD-10-CM | POA: Diagnosis not present

## 2018-09-06 DIAGNOSIS — M25561 Pain in right knee: Secondary | ICD-10-CM | POA: Diagnosis not present

## 2018-09-06 DIAGNOSIS — K7460 Unspecified cirrhosis of liver: Secondary | ICD-10-CM | POA: Diagnosis not present

## 2018-09-06 DIAGNOSIS — Z6836 Body mass index (BMI) 36.0-36.9, adult: Secondary | ICD-10-CM | POA: Diagnosis not present

## 2018-09-06 DIAGNOSIS — R300 Dysuria: Secondary | ICD-10-CM | POA: Diagnosis not present

## 2018-09-06 DIAGNOSIS — I10 Essential (primary) hypertension: Secondary | ICD-10-CM | POA: Diagnosis not present

## 2018-09-06 DIAGNOSIS — Z794 Long term (current) use of insulin: Secondary | ICD-10-CM | POA: Diagnosis not present

## 2018-09-06 DIAGNOSIS — Z01818 Encounter for other preprocedural examination: Secondary | ICD-10-CM | POA: Diagnosis not present

## 2018-09-06 DIAGNOSIS — D696 Thrombocytopenia, unspecified: Secondary | ICD-10-CM | POA: Diagnosis not present

## 2018-09-06 DIAGNOSIS — G4700 Insomnia, unspecified: Secondary | ICD-10-CM | POA: Diagnosis not present

## 2018-09-12 NOTE — H&P (Signed)
TOTAL KNEE ADMISSION H&P  Patient is being admitted for right total knee arthroplasty.  Subjective:  Chief Complaint:  Right knee primary OA / pain  HPI: Brittany Russo, 73 y.o. female, has a history of pain and functional disability in the right knee due to arthritis and has failed non-surgical conservative treatments for greater than 12 weeks to includeNSAID's and/or analgesics, corticosteriod injections, use of assistive devices and activity modification.  Onset of symptoms was gradual, starting  years ago with gradually worsening course since that time. The patient noted no past surgery on the right knee(s).  Patient currently rates pain in the right knee(s) at 8 out of 10 with activity. Patient has night pain, worsening of pain with activity and weight bearing, pain that interferes with activities of daily living, pain with passive range of motion, crepitus and joint swelling.  Patient has evidence of periarticular osteophytes and joint space narrowing by imaging studies.  There is no active infection.  Risks, benefits and expectations were discussed with the patient.  Risks including but not limited to the risk of anesthesia, blood clots, nerve damage, blood vessel damage, failure of the prosthesis, infection and up to and including death.  Patient understand the risks, benefits and expectations and wishes to proceed with surgery.   PCP: Rosalee Kaufman, PA-C  D/C Plans:       Home   Post-op Meds:       No Rx given   Tranexamic Acid:      To be given - IV   Decadron:      Is to be given  FYI:      ?   (ASA caused vaginal bleeding, states that she can't use)  Norco  DME:   Pt equipment arranged  PT:   OPPT arranged  [facility] [place]    Patient Active Problem List   Diagnosis Date Noted  . Type 2 diabetes mellitus with hyperglycemia, with long-term current use of insulin ([place]) 07/11/2015  . Hypertension 07/11/2015  . Hyperlipidemia 07/11/2015   Past Medical History:   Diagnosis Date  . Allergy   . Anxiety   . Arthritis   . Cirrhosis of liver ([place])   . Depression   . Diabetes mellitus without complication ([place])   . Endometrial cancer ([place])   . Headache   . Hyperlipidemia   . Hypertension   . Seizures ([place])   . Thyroid nodule   . Vaginal cancer [facility])     Past Surgical History:  Procedure Laterality Date  . CATARACT EXTRACTION W/PHACO Left 06/04/2017   Procedure: CATARACT EXTRACTION PHACO AND INTRAOCULAR LENS PLACEMENT (IOC);  Surgeon: Baruch Goldmann, MD;  Location: [facility];  Service: Ophthalmology;  Laterality: Left;  CDE: 3.90  . CATARACT EXTRACTION W/PHACO Right 07/15/2017   Procedure: CATARACT EXTRACTION PHACO AND INTRAOCULAR LENS PLACEMENT (IOC);  Surgeon: Baruch Goldmann, MD;  Location: [facility];  Service: Ophthalmology;  Laterality: Right;  CDE: 7.60  . DILATION AND CURETTAGE OF UTERUS    . histerectomy      No current facility-administered medications for this encounter.    Current Outpatient Medications  Medication Sig Dispense Refill Last Dose  . albuterol (PROVENTIL HFA;VENTOLIN HFA) 108 (90 Base) MCG/ACT inhaler Inhale 2 puffs into the lungs every 4 (four) hours as needed for wheezing or shortness of breath. 1 Inhaler 3 Taking  . ALPRAZolam (XANAX) 0.25 MG tablet Take 0.25 mg by mouth 2 (two) times daily.    Taking  . atenolol (TENORMIN)  50 MG tablet Take 50 mg by mouth daily.   Taking  . bismuth subsalicylate (PEPTO BISMOL) 262 MG/15ML suspension Take 30 mLs by mouth every 6 (six) hours as needed for indigestion.   Taking  . Calcium Carb-Cholecalciferol (CALCIUM 600+D) 600-800 MG-UNIT TABS Take 1 tablet by mouth daily.   Taking  . candesartan-hydrochlorothiazide (ATACAND HCT) 16-12.5 MG tablet Take 1 tablet by mouth daily.   Taking  . chlorpheniramine-HYDROcodone (TUSSIONEX PENNKINETIC ER) 10-8 MG/5ML SUER Take 5 mLs by mouth 2 (two) times daily. 50 mL 0   . citalopram (CELEXA) 40 MG tablet Take 40 mg by mouth daily.   Taking  .  Cyanocobalamin 1000 MCG/ML KIT Inject as directed.   Taking  . diphenhydrAMINE-PSE-APAP (ALLERGY SINUS HEADACHE RELIEF PO) Take 1 tablet by mouth daily as needed (allergies).   Taking  . Ferrous Gluconate (IRON 27 PO) Take by mouth.   Taking  . glipiZIDE (GLUCOTROL) 5 MG tablet Take 5 mg by mouth 2 (two) times daily.   Taking  . guaiFENesin-codeine (ROBITUSSIN AC) 100-10 MG/5ML syrup Take 5 mLs by mouth 3 (three) times daily as needed for cough. 120 mL 0 Taking  . ibuprofen (ADVIL,MOTRIN) 200 MG tablet Take 200 mg by mouth every 6 (six) hours as needed.   Taking  . Insulin Glargine (BASAGLAR KWIKPEN [place]) Inject 65 Units into the skin 2 (two) times daily as needed (If blood sugar is high).    Taking  . montelukast (SINGULAIR) 10 MG tablet Take 10 mg by mouth at bedtime.   Taking  . Omega-3 Fatty Acids (OMEGA-3 1450 PO) Take by mouth.   Taking  . simvastatin (ZOCOR) 40 MG tablet Take 40 mg by mouth at bedtime.   Taking  . sitaGLIPtin-metformin (JANUMET) 50-500 MG per tablet Take 1 tablet by mouth 2 (two) times daily with a meal.   Taking   No Known Allergies  Social History   Tobacco Use  . Smoking status: Never Smoker  . Smokeless tobacco: Never Used  Substance Use Topics  . Alcohol use: No    Family History  Problem Relation Age of Onset  . Heart disease Mother   . Diabetes Mother   . Heart disease Father   . Cancer Father   . Cancer Maternal Grandmother   . Heart disease Maternal Grandfather      Review of Systems  Constitutional: Negative.   HENT: Negative.   Eyes: Negative.   Respiratory: Negative.   Cardiovascular: Negative.   Gastrointestinal: Positive for constipation.  Genitourinary: Negative.   Musculoskeletal: Positive for joint pain.  Skin: Negative.   Neurological: Positive for headaches.  Endo/Heme/Allergies: Positive for environmental allergies.  Psychiatric/Behavioral: Positive for depression. The patient is nervous/anxious.     Objective:  Physical Exam   Constitutional: She is oriented to person, place, and time. She appears well-developed.  HENT:  Head: Normocephalic.  Eyes: Pupils are equal, round, and reactive to light.  Neck: Neck supple. No JVD present. No tracheal deviation present. No thyromegaly present.  Cardiovascular: Normal rate, regular rhythm and intact distal pulses.  Murmur heard. Respiratory: Effort normal and breath sounds normal. No respiratory distress. She has no wheezes.  GI: Soft. There is no abdominal tenderness. There is no guarding.  Lymphadenopathy:    She has no cervical adenopathy.  Neurological: She is alert and oriented to person, place, and time. A sensory deficit (bilateral LE neuropathy, DM) is present.  Skin: Skin is warm and dry.  Psychiatric: She has a normal mood  and affect.     Labs:  Estimated body mass index is 32.85 kg/m as calculated from the following:   Height as of 03/03/18: _0  (1.575 m).   Weight as of 03/03/18: 81.5 kg.   Imaging Review Plain radiographs demonstrate severe degenerative joint disease of the right knee. The bone quality appears to be good for age and reported activity level.      Assessment/Plan:  End stage arthritis, right knee   The patient history, physical examination, clinical judgment of the provider and imaging studies are consistent with end stage degenerative joint disease of the right knee and total knee arthroplasty is deemed medically necessary. The treatment options including medical management, injection therapy arthroscopy and arthroplasty were discussed at length. The risks and benefits of total knee arthroplasty were presented and reviewed. The risks due to aseptic loosening, infection, stiffness, patella tracking problems, thromboembolic complications and other imponderables were discussed. The patient acknowledged the explanation, agreed to proceed with the plan and consent was signed. Patient is being admitted for inpatient treatment for  surgery, pain control, PT, OT, prophylactic antibiotics, VTE prophylaxis, progressive ambulation and ADL's and discharge planning. The patient is planning to be discharged home.    Anticipated LOS equal to or greater than 2 midnights due to - Age 91 and older with one or more of the following:  - Obesity  - Expected need for hospital services (PT, OT, Nursing) required for safe  discharge  - Anticipated need for postoperative skilled nursing care or inpatient rehab  - Active co-morbidities: Diabetes     [place]. Santo Zahradnik   PA-C  09/12/2018, 12:12 PM

## 2018-09-14 DIAGNOSIS — E1165 Type 2 diabetes mellitus with hyperglycemia: Secondary | ICD-10-CM | POA: Diagnosis not present

## 2018-09-15 NOTE — Patient Instructions (Addendum)
YOU NEED TO HAVE A COVID 19 TEST ON_____Today, June 29th______, THIS TEST MUST BE DONE BEFORE SURGERY, COME TO [place] ENTRANCE.             ONCE YOUR COVID TEST IS COMPLETED, PLEASE BEGIN THE QUARANTINE INSTRUCTIONS AS OUTLINED IN YOUR HANDOUT.                  Brittany Russo   Your procedure is scheduled on: Thursday 09/22/2018  Report to [facility] Main  Entrance              Report to  Short Stay at  0530  AM                 Call this number if you have problems the morning of surgery [place], NO CHEWING GUM [place].    Remember: Do not eat food After Midnight. YOU MAY HAVE CLEAR LIQUIDS FROM MIDNIGHT UNTIL 4:30AM. At 4:30AM Please finish the prescribed Pre-Surgery Gatorade drink. Nothing by mouth after you finish the Gatorade drink !   CLEAR LIQUID DIET   Foods Allowed                                                                     Foods Excluded  Coffee and tea, regular and decaf                             liquids that you cannot  Plain Jell-O in any flavor                                             see through such as: Fruit ices (not with fruit pulp)                                     milk, soups, orange juice  Iced Popsicles                                    All solid food Carbonated beverages, regular and diet                                    Cranberry, grape and apple juices Sports drinks like Gatorade Lightly seasoned clear broth or consume(fat free) Sugar, honey syrup  Sample Menu Breakfast                                Lunch                                     Supper Cranberry juice  Beef broth                            Chicken broth Jell-O                                     Grape juice                           Apple juice Coffee or tea                        Jell-O                                      Popsicle                                                 Coffee or tea                        Coffee or tea  _____________________________________________________________________                    Take these medicines the morning of surgery with A SIP OF WATER: ATENOLOL, DILTIAZEM, CITALOPRAM, LORATADINE, XANAX, NASAL SPRAY    How to Manage Your Diabetes Before and After Surgery  Why is it important to control my blood sugar before and after surgery? . Improving blood sugar levels before and after surgery helps healing and can limit problems. . A way of improving blood sugar control is eating a healthy diet by: o  Eating less sugar and carbohydrates o  Increasing activity/exercise o  Talking with your doctor about reaching your blood sugar goals . High blood sugars (greater than 180 mg/dL) can raise your risk of infections and slow your recovery, so you will need to focus on controlling your diabetes during the weeks before surgery. . Make sure that the doctor who takes care of your diabetes knows about your planned surgery including the date and location.  How do I manage my blood sugar before surgery? . Check your blood sugar at least 4 times a day, starting 2 days before surgery, to make sure that the level is not too high or low. o Check your blood sugar the morning of your surgery when you wake up and every 2 hours until you get to the Short Stay unit. . If your blood sugar is less than 70 mg/dL, you will need to treat for low blood sugar: o Do not take insulin. o Treat a low blood sugar (less than 70 mg/dL) with  cup of clear juice (cranberry or apple), 4 glucose tablets, OR glucose gel. o Recheck blood sugar in 15 minutes after treatment (to make sure it is greater than 70 mg/dL). If your blood sugar is not greater than 70 mg/dL on recheck, call [phone] for further instructions. . Report your blood sugar to the short stay nurse when you get to Short Stay.  . If you are admitted to  the hospital after surgery: o Your blood sugar will be checked by the staff and  you will probably be given insulin after surgery (instead of oral diabetes medicines) to make sure you have good blood sugar levels. o The goal for blood sugar control after surgery is 80-180 mg/dL.   WHAT DO I DO ABOUT MY DIABETES MEDICATION?    The day before surgery, take Basaglar Insulin as normal            The day before surgery, Take the MORNING DOSE ONLY of Glipizide (Glucotrol)!           The day before surgery, DO NOT TAKE ANY OF Sitagliptin-Metformin (Janumet)!   . THE MORNING OF SURGERY , DO NOT TAKE ANY ORAL DIABETES MEDICATION . THE MORNING OF SURGERY , ONLY TAKE 50% OF BASAGLAR INSULIN                         You may not have any metal on your body including hair pins and              piercings  Do not wear jewelry, make-up, lotions, powders or perfumes, deodorant             Do not wear nail polish.  Do not shave  48 hours prior to surgery.                Do not bring valuables to the hospital. [place].  Contacts, dentures or bridgework may not be worn into surgery.  Leave suitcase in the car. After surgery it may be brought to your room.                Please read over the following fact sheets you were given:  _____________________________________________________________________               [facility] - Preparing for Surgery Before surgery, you can play an important role.  Because skin is not sterile, your skin needs to be as free of germs as possible.  You can reduce the number of germs on your skin by washing with CHG (chlorahexidine gluconate) soap before surgery.  CHG is an antiseptic cleaner which kills germs and bonds with the skin to continue killing germs even after washing. Please DO NOT use if you have an allergy to CHG or antibacterial soaps.  If your skin becomes reddened/irritated stop using the CHG and inform  your nurse when you arrive at Short Stay. Do not shave (including legs and underarms) for at least 48 hours prior to the first CHG shower.  You may shave your face/neck. Please follow these instructions carefully:  1.  Shower with CHG Soap the night before surgery and the  morning of Surgery.  2.  If you choose to wash your hair, wash your hair first as usual with your  normal  shampoo.  3.  After you shampoo, rinse your hair and body thoroughly to remove the  shampoo.                           4.  Use CHG as you would any other liquid soap.  You can apply chg directly  to the skin and wash                       Gently with a scrungie or clean washcloth.  5.  Apply  the CHG Soap to your body ONLY FROM THE NECK DOWN.   Do not use on face/ open                           Wound or open sores. Avoid contact with eyes, ears mouth and genitals (private parts).                       Wash face,  Genitals (private parts) with your normal soap.             6.  Wash thoroughly, paying special attention to the area where your surgery  will be performed.  7.  Thoroughly rinse your body with warm water from the neck down.  8.  DO NOT shower/wash with your normal soap after using and rinsing off  the CHG Soap.                9.  Pat yourself dry with a clean towel.            10.  Wear clean pajamas.            11.  Place clean sheets on your bed the night of your first shower and do not  sleep with pets. Day of Surgery : Do not apply any lotions/deodorants the morning of surgery.  Please wear clean clothes to the hospital/surgery center.  FAILURE TO FOLLOW THESE INSTRUCTIONS MAY RESULT IN THE CANCELLATION OF YOUR SURGERY PATIENT SIGNATURE_________________________________  NURSE SIGNATURE__________________________________  ________________________________________________________________________   Adam Phenix  An incentive spirometer is a tool that can help keep your lungs clear and active. This  tool measures how well you are filling your lungs with each breath. Taking long deep breaths may help reverse or decrease the chance of developing breathing (pulmonary) problems (especially infection) following:  A long period of time when you are unable to move or be active. BEFORE THE PROCEDURE   If the spirometer includes an indicator to show your best effort, your nurse or respiratory therapist will set it to a desired goal.  If possible, sit up straight or lean slightly forward. Try not to slouch.  Hold the incentive spirometer in an upright position. INSTRUCTIONS FOR USE  1. Sit on the edge of your bed if possible, or sit up as far as you can in bed or on a chair. 2. Hold the incentive spirometer in an upright position. 3. Breathe out normally. 4. Place the mouthpiece in your mouth and seal your lips tightly around it. 5. Breathe in slowly and as deeply as possible, raising the piston or the ball toward the top of the column. 6. Hold your breath for 3-5 seconds or for as long as possible. Allow the piston or ball to fall to the bottom of the column. 7. Remove the mouthpiece from your mouth and breathe out normally. 8. Rest for a few seconds and repeat Steps 1 through 7 at least 10 times every 1-2 hours when you are awake. Take your time and take a few normal breaths between deep breaths. 9. The spirometer may include an indicator to show your best effort. Use the indicator as a goal to work toward during each repetition. 10. After each set of 10 deep breaths, practice coughing to be sure your lungs are clear. If you have an incision (the cut made at the time of surgery), support your incision when coughing by placing a pillow or rolled  up towels firmly against it. Once you are able to get out of bed, walk around indoors and cough well. You may stop using the incentive spirometer when instructed by your caregiver.  RISKS AND COMPLICATIONS  Take your time so you do not get dizzy or  light-headed.  If you are in pain, you may need to take or ask for pain medication before doing incentive spirometry. It is harder to take a deep breath if you are having pain. AFTER USE  Rest and breathe slowly and easily.  It can be helpful to keep track of a log of your progress. Your caregiver can provide you with a simple table to help with this. If you are using the spirometer at home, follow these instructions: [place] IF:   You are having difficultly using the spirometer.  You have trouble using the spirometer as often as instructed.  Your pain medication is not giving enough relief while using the spirometer.  You develop fever of 100.5 F (38.1 C) or higher. SEEK IMMEDIATE MEDICAL CARE IF:   You cough up bloody sputum that had not been present before.  You develop fever of 102 F (38.9 C) or greater.  You develop worsening pain at or near the incision site. MAKE SURE YOU:   Understand these instructions.  Will watch your condition.  Will get help right away if you are not doing well or get worse. Document Released: 07/20/2006 Document Revised: 06/01/2011 Document Reviewed: 09/20/2006 Exit[employer] Patient Information 2014 Exit[employer], [place].   ________________________________________________________________________  WHAT IS A BLOOD TRANSFUSION? Blood Transfusion Information  A transfusion is the replacement of blood or some of its parts. Blood is made up of multiple cells which provide different functions.  Red blood cells carry oxygen and are used for blood loss replacement.  White blood cells fight against infection.  Platelets control bleeding.  Plasma helps clot blood.  Other blood products are available for specialized needs, such as hemophilia or other clotting disorders. BEFORE THE TRANSFUSION  Who gives blood for transfusions?   Healthy volunteers who are fully evaluated to make sure their blood is safe. This is blood bank  blood. Transfusion therapy is the safest it has ever been in the practice of medicine. Before blood is taken from a donor, a complete history is taken to make sure that person has no history of diseases nor engages in risky social behavior (examples are intravenous drug use or sexual activity with multiple partners). The donor's travel history is screened to minimize risk of transmitting infections, such as malaria. The donated blood is tested for signs of infectious diseases, such as HIV and hepatitis. The blood is then tested to be sure it is compatible with you in order to minimize the chance of a transfusion reaction. If you or a relative donates blood, this is often done in anticipation of surgery and is not appropriate for emergency situations. It takes many days to process the donated blood. RISKS AND COMPLICATIONS Although transfusion therapy is very safe and saves many lives, the main dangers of transfusion include:   Getting an infectious disease.  Developing a transfusion reaction. This is an allergic reaction to something in the blood you were given. Every precaution is taken to prevent this. The decision to have a blood transfusion has been considered carefully by your caregiver before blood is given. Blood is not given unless the benefits outweigh the risks. AFTER THE TRANSFUSION  Right after receiving a blood transfusion, you will usually feel  much better and more energetic. This is especially true if your red blood cells have gotten low (anemic). The transfusion raises the level of the red blood cells which carry oxygen, and this usually causes an energy increase.  The nurse administering the transfusion will monitor you carefully for complications. HOME CARE INSTRUCTIONS  No special instructions are needed after a transfusion. You may find your energy is better. Speak with your caregiver about any limitations on activity for underlying diseases you may have. SEEK MEDICAL CARE IF:    Your condition is not improving after your transfusion.  You develop redness or irritation at the intravenous (IV) site. SEEK IMMEDIATE MEDICAL CARE IF:  Any of the following symptoms occur over the next 12 hours:  Shaking chills.  You have a temperature by mouth above 102 F (38.9 C), not controlled by medicine.  Chest, back, or muscle pain.  People around you feel you are not acting correctly or are confused.  Shortness of breath or difficulty breathing.  Dizziness and fainting.  You get a rash or develop hives.  You have a decrease in urine output.  Your urine turns a dark color or changes to pink, red, or brown. Any of the following symptoms occur over the next 10 days:  You have a temperature by mouth above 102 F (38.9 C), not controlled by medicine.  Shortness of breath.  Weakness after normal activity.  The white part of the eye turns yellow (jaundice).  You have a decrease in the amount of urine or are urinating less often.  Your urine turns a dark color or changes to pink, red, or brown. Document Released: 03/06/2000 Document Revised: 06/01/2011 Document Reviewed: 10/24/2007 [facility] Patient Information 2014 [place], [place].  _______________________________________________________________________

## 2018-09-19 ENCOUNTER — Encounter (HOSPITAL_COMMUNITY): Payer: Self-pay

## 2018-09-19 ENCOUNTER — Other Ambulatory Visit (HOSPITAL_COMMUNITY)
Admission: RE | Admit: 2018-09-19 | Discharge: 2018-09-19 | Disposition: A | Discharge status: Home / Self Care | Payer: Medicare Other | Source: Ambulatory Visit

## 2018-09-19 ENCOUNTER — Other Ambulatory Visit: Payer: Self-pay

## 2018-09-19 ENCOUNTER — Encounter (HOSPITAL_COMMUNITY)
Admission: RE | Admit: 2018-09-19 | Discharge: 2018-09-19 | Disposition: A | Discharge status: Home / Self Care | Payer: Medicare Other | Source: Ambulatory Visit | Attending: Orthopedic Surgery | Admitting: Orthopedic Surgery

## 2018-09-19 DIAGNOSIS — M25561 Pain in right knee: Secondary | ICD-10-CM | POA: Insufficient documentation

## 2018-09-19 DIAGNOSIS — Z1159 Encounter for screening for other viral diseases: Secondary | ICD-10-CM | POA: Diagnosis not present

## 2018-09-19 DIAGNOSIS — K7200 Acute and subacute hepatic failure without coma: Secondary | ICD-10-CM | POA: Diagnosis not present

## 2018-09-19 DIAGNOSIS — M1711 Unilateral primary osteoarthritis, right knee: Secondary | ICD-10-CM | POA: Insufficient documentation

## 2018-09-19 DIAGNOSIS — E871 Hypo-osmolality and hyponatremia: Secondary | ICD-10-CM | POA: Diagnosis not present

## 2018-09-19 DIAGNOSIS — I10 Essential (primary) hypertension: Secondary | ICD-10-CM | POA: Insufficient documentation

## 2018-09-19 DIAGNOSIS — E119 Type 2 diabetes mellitus without complications: Secondary | ICD-10-CM | POA: Insufficient documentation

## 2018-09-19 DIAGNOSIS — Z01818 Encounter for other preprocedural examination: Secondary | ICD-10-CM | POA: Insufficient documentation

## 2018-09-19 DIAGNOSIS — S72401A Unspecified fracture of lower end of right femur, initial encounter for closed fracture: Secondary | ICD-10-CM | POA: Diagnosis not present

## 2018-09-19 DIAGNOSIS — S3723XA Laceration of bladder, initial encounter: Secondary | ICD-10-CM | POA: Diagnosis not present

## 2018-09-19 DIAGNOSIS — M9711XA Periprosthetic fracture around internal prosthetic right knee joint, initial encounter: Secondary | ICD-10-CM | POA: Diagnosis not present

## 2018-09-19 DIAGNOSIS — M9701XA Periprosthetic fracture around internal prosthetic right hip joint, initial encounter: Secondary | ICD-10-CM | POA: Diagnosis not present

## 2018-09-19 HISTORY — DX: Dermatitis, unspecified: L30.9

## 2018-09-19 LAB — CBC
HCT: 35 % — ABNORMAL LOW (ref 36.0–46.0)
Hemoglobin: 11.6 g/dL — ABNORMAL LOW (ref 12.0–15.0)
MCH: 29.9 pg (ref 26.0–34.0)
MCHC: 33.1 g/dL (ref 30.0–36.0)
MCV: 90.2 fL (ref 80.0–100.0)
Platelets: 76 10*3/uL — ABNORMAL LOW (ref 150–400)
RBC: 3.88 MIL/uL (ref 3.87–5.11)
RDW: 12.9 % (ref 11.5–15.5)
WBC: 4.7 10*3/uL (ref 4.0–10.5)
nRBC: 0 % (ref 0.0–0.2)

## 2018-09-19 LAB — BASIC METABOLIC PANEL
Anion gap: 8 (ref 5–15)
BUN: 13 mg/dL (ref 8–23)
CO2: 26 mmol/L (ref 22–32)
Calcium: 9.2 mg/dL (ref 8.9–10.3)
Chloride: 106 mmol/L (ref 98–111)
Creatinine, Ser: 0.72 mg/dL (ref 0.44–1.00)
GFR calc Af Amer: >60 mL/min (ref >60)
GFR calc non Af Amer: >60 mL/min (ref >60)
Glucose, Bld: 83 mg/dL (ref 70–99)
Potassium: 4 mmol/L (ref 3.5–5.1)
Sodium: 140 mmol/L (ref 135–145)

## 2018-09-19 LAB — SARS CORONAVIRUS 2 (TAT 6-24 HRS): SARS Coronavirus 2: NEGATIVE

## 2018-09-19 LAB — SURGICAL PCR SCREEN
MRSA, PCR: NEGATIVE
Staphylococcus aureus: NEGATIVE

## 2018-09-19 LAB — GLUCOSE, CAPILLARY: Glucose-Capillary: 84 mg/dL (ref 70–99)

## 2018-09-19 NOTE — Progress Notes (Signed)
EKG 09-06-2018 AND HGBA1C 09-14-2018 ON CHART FROM [facility]

## 2018-09-20 LAB — ABO/RH: ABO/RH(D): A POS

## 2018-09-20 NOTE — Progress Notes (Addendum)
Anesthesia Chart Review   Case: [id number] Date/Time: 09/22/18 0700   Procedure: TOTAL KNEE ARTHROPLASTY (Right ) - 70 mins   Anesthesia type: Spinal   Pre-op diagnosis: Right knee osteoarthritis   Location: [facility] ROOM 09 / [facility]   Surgeon: Paralee Cancel, MD      DISCUSSION:72 y.o. never smoker with h/o DM II, HLD, HTN, depression, anxiety, seizures (single episode related to hypogylcemia), nonalcoholic liver cirrhosis, mild to moderate thrombocytopenia (platelets 76 at PAT visit 09/19/2018), right knee OA scheduled for above procedure 09/22/2018 with Dr. Paralee Cancel.    Pt last seen by hematology, Dr. Wanita Chamberlain, 09/06/2018.  Per OV note, "Thrombocytopenia, multifactorial causes, mainly cirrhosis with a contribution from B12 deficiency, immune destruction as judged by the presence of antiplatelet antibodies, pherhaps, by congestive heart failure and prior pelvic irradiation." Dr. Federico Flake aware of upcoming surgery.  He states, "Hip arthroplasty has been performed in patients with ITP.  No significant differences were found between those patients and normals with regard to hip scores and complication rates. Platelets 76 09/18/17, have ranged between 105-66 over the last year.   Per Dr. Federico Flake Echo performed 04/2017 which showed  LVEF 55 to 60% with moderate LVH; there was mild to moderate MR and RV pressure was 34 mm Hg.   Platelets 76 at PAT visit 09/22/2018.  Currently scheduled for spinal, Dr. Alvan Dame made aware.    Pt last seen by cardiologist, Dr. Carlyle Dolly, 01/18/18, stable at this visit.  Stress Test 09/13/17 which was a low risk study.  Clearance received from Dr. Harl Bowie (on chart) which states pt is low risk for surgical procedure.    VS: BP (!) 168/61 Comment: rue  Pulse 66   Temp 36.8 C (Oral)   Resp 18   Ht 5' 2"  (1.575 m)   Wt 87.7 kg   SpO2 99%   BMI 35.35 kg/m   PROVIDERS: Rosalee Kaufman, PA-C is PCP   Wanita Chamberlain, MD is Hematologist   Carlyle Dolly,  MD is Cardiologist  LABS: Labs reviewed: Acceptable for surgery. (all labs ordered are listed, but only abnormal results are displayed)  Labs Reviewed  CBC - Abnormal; Notable for the following components:      Result Value   Hemoglobin 11.6 (*)    HCT 35.0 (*)    Platelets 76 (*)    All other components within normal limits  SURGICAL PCR SCREEN  GLUCOSE, CAPILLARY  BASIC METABOLIC PANEL  TYPE AND SCREEN  ABO/RH     IMAGES:   EKG: 09/06/2018 (on chart) Rate 59 bpm Sinus bradycardia with first degree AV block Possible left atrial enlargement  Possible ventricular hypertrophy  Nonspecific T wave abnormality  CV: Stress Test 09/13/17  T wave inversion was noted during stress in the II, III, aVF, V5 and V6 leads.  The study is normal.  This is a low risk study.  The left ventricular ejection fraction is normal (55-65%).  Past Medical History:  Diagnosis Date  . Allergy   . Anxiety   . Arthritis   . Cirrhosis of liver (HCC)    nonalcoholic per patient , reports she takes a medication that makes her have bowel movements  to treat her liver   . Depression   . Diabetes mellitus without complication ([place])    type 2  . Eczema of both hands   . Endometrial cancer ([place]) 2005   treated with radiation therapy    . Headache   . Hyperlipidemia   .  Hypertension   . Seizures ([place])    last seizure June 07, 2014   . Thyroid nodule   . Vaginal cancer [facility])     Past Surgical History:  Procedure Laterality Date  . CATARACT EXTRACTION W/PHACO Left 06/04/2017   Procedure: CATARACT EXTRACTION PHACO AND INTRAOCULAR LENS PLACEMENT (IOC);  Surgeon: Baruch Goldmann, MD;  Location: [facility];  Service: Ophthalmology;  Laterality: Left;  CDE: 3.90  . CATARACT EXTRACTION W/PHACO Right 07/15/2017   Procedure: CATARACT EXTRACTION PHACO AND INTRAOCULAR LENS PLACEMENT (IOC);  Surgeon: Baruch Goldmann, MD;  Location: [facility];  Service: Ophthalmology;  Laterality: Right;  CDE: 7.60  . DILATION  AND CURETTAGE OF UTERUS    . TOTAL ABDOMINAL HYSTERECTOMY      MEDICATIONS: . albuterol (PROVENTIL HFA;VENTOLIN HFA) 108 (90 Base) MCG/ACT inhaler  . ALPRAZolam (XANAX) 0.25 MG tablet  . atenolol (TENORMIN) 50 MG tablet  . Calcium Carb-Cholecalciferol (CALCIUM 600+D) 600-800 MG-UNIT TABS  . cephALEXin (KEFLEX) 500 MG capsule  . chlorpheniramine-HYDROcodone (TUSSIONEX PENNKINETIC ER) 10-8 MG/5ML SUER  . citalopram (CELEXA) 40 MG tablet  . Cyanocobalamin 1000 MCG/ML KIT  . diltiazem (CARTIA XT) 180 MG 24 hr capsule  . ferrous sulfate 325 (65 FE) MG tablet  . fluticasone (FLONASE) 50 MCG/ACT nasal spray  . glipiZIDE (GLUCOTROL) 5 MG tablet  . guaiFENesin-codeine (ROBITUSSIN AC) 100-10 MG/5ML syrup  . hydrochlorothiazide (MICROZIDE) 12.5 MG capsule  . ibuprofen (ADVIL,MOTRIN) 200 MG tablet  . Insulin Glargine (BASAGLAR KWIKPEN) 100 UNIT/ML SOPN  . loratadine (CLARITIN) 10 MG tablet  . montelukast (SINGULAIR) 10 MG tablet  . Omega-3 Fatty Acids (OMEGA-3 1450 PO)  . simvastatin (ZOCOR) 40 MG tablet  . sitaGLIPtin-metformin (JANUMET) 50-500 MG per tablet   No current facility-administered medications for this encounter.     Maia Plan [facility] Pre-Surgical Testing [phone] 09/20/18  3:23 PM

## 2018-09-20 NOTE — Anesthesia Preprocedure Evaluation (Addendum)
Anesthesia Evaluation  Patient identified by MRN, date of birth, ID band Patient awake    Reviewed: Allergy & Precautions, NPO status , Patient's Chart, lab work & pertinent test results, reviewed documented beta blocker date and time   Airway Mallampati: III  TM Distance: >3 FB Neck ROM: Full    Dental no notable dental hx. (+) Teeth Intact   Pulmonary neg pulmonary ROS,    Pulmonary exam normal breath sounds clear to auscultation       Cardiovascular hypertension, Pt. on medications and Pt. on home beta blockers Normal cardiovascular exam Rhythm:Regular Rate:Normal     Neuro/Psych  Headaches, Seizures -, Well Controlled,  PSYCHIATRIC DISORDERS Anxiety Depression    GI/Hepatic negative GI ROS, (+) Cirrhosis       , NASH   Endo/Other  diabetes, Well Controlled, Type 2, Insulin DependentHyperlipidemia Obesity  Renal/GU negative Renal ROS  negative genitourinary   Musculoskeletal  (+) Arthritis , Osteoarthritis,  OA right knee   Abdominal   Peds  Hematology  (+) anemia , Thrombocytopenia- thought due to cirrhosis   Anesthesia Other Findings   Reproductive/Obstetrics Hx/o Endometrial Ca S/P RT 2005                          Anesthesia Physical Anesthesia Plan  ASA: III  Anesthesia Plan: Spinal   Post-op Pain Management:  Regional for Post-op pain   Induction: Intravenous  PONV Risk Score and Plan: 3 and Ondansetron, Treatment may vary due to age or medical condition, Propofol infusion and Midazolam  Airway Management Planned: Natural Airway and Simple Face Mask  Additional Equipment:   Intra-op Plan:   Post-operative Plan:   Informed Consent: I have reviewed the patients History and Physical, chart, labs and discussed the procedure including the risks, benefits and alternatives for the proposed anesthesia with the patient or authorized representative who has indicated his/her  understanding and acceptance.     Dental advisory given  Plan Discussed with: CRNA and Surgeon  Anesthesia Plan Comments: (See PAT note 09/19/2018, Konrad Felix, PA-C)      Anesthesia Quick Evaluation

## 2018-09-21 ENCOUNTER — Encounter (HOSPITAL_COMMUNITY): Payer: Self-pay | Admitting: Anesthesiology

## 2018-09-21 DIAGNOSIS — R188 Other ascites: Secondary | ICD-10-CM

## 2018-09-21 HISTORY — DX: Other ascites: R18.8

## 2018-09-22 ENCOUNTER — Ambulatory Visit (HOSPITAL_COMMUNITY): Payer: Medicare Other | Admitting: Physician Assistant

## 2018-09-22 ENCOUNTER — Observation Stay (HOSPITAL_COMMUNITY)
Admission: RE | Admit: 2018-09-22 | Discharge: 2018-09-23 | Disposition: A | Discharge status: Home / Self Care | Payer: Medicare Other | Source: Home / Self Care | Attending: Orthopedic Surgery | Admitting: Orthopedic Surgery

## 2018-09-22 ENCOUNTER — Encounter (HOSPITAL_COMMUNITY): Payer: Self-pay | Admitting: Emergency Medicine

## 2018-09-22 ENCOUNTER — Encounter (HOSPITAL_COMMUNITY)
Admission: RE | Disposition: A | Discharge status: Home / Self Care | Payer: Self-pay | Source: Home / Self Care | Attending: Orthopedic Surgery

## 2018-09-22 ENCOUNTER — Other Ambulatory Visit: Payer: Self-pay

## 2018-09-22 ENCOUNTER — Ambulatory Visit (HOSPITAL_COMMUNITY): Payer: Medicare Other | Admitting: Anesthesiology

## 2018-09-22 DIAGNOSIS — Z8544 Personal history of malignant neoplasm of other female genital organs: Secondary | ICD-10-CM | POA: Insufficient documentation

## 2018-09-22 DIAGNOSIS — Z6835 Body mass index (BMI) 35.0-35.9, adult: Secondary | ICD-10-CM | POA: Insufficient documentation

## 2018-09-22 DIAGNOSIS — F419 Anxiety disorder, unspecified: Secondary | ICD-10-CM | POA: Insufficient documentation

## 2018-09-22 DIAGNOSIS — S72401A Unspecified fracture of lower end of right femur, initial encounter for closed fracture: Secondary | ICD-10-CM | POA: Diagnosis not present

## 2018-09-22 DIAGNOSIS — K7460 Unspecified cirrhosis of liver: Secondary | ICD-10-CM | POA: Insufficient documentation

## 2018-09-22 DIAGNOSIS — E119 Type 2 diabetes mellitus without complications: Secondary | ICD-10-CM | POA: Diagnosis not present

## 2018-09-22 DIAGNOSIS — X58XXXA Exposure to other specified factors, initial encounter: Secondary | ICD-10-CM | POA: Insufficient documentation

## 2018-09-22 DIAGNOSIS — K7581 Nonalcoholic steatohepatitis (NASH): Secondary | ICD-10-CM | POA: Insufficient documentation

## 2018-09-22 DIAGNOSIS — Z8542 Personal history of malignant neoplasm of other parts of uterus: Secondary | ICD-10-CM | POA: Insufficient documentation

## 2018-09-22 DIAGNOSIS — R188 Other ascites: Secondary | ICD-10-CM | POA: Diagnosis not present

## 2018-09-22 DIAGNOSIS — E669 Obesity, unspecified: Secondary | ICD-10-CM | POA: Insufficient documentation

## 2018-09-22 DIAGNOSIS — D649 Anemia, unspecified: Secondary | ICD-10-CM | POA: Insufficient documentation

## 2018-09-22 DIAGNOSIS — Z833 Family history of diabetes mellitus: Secondary | ICD-10-CM | POA: Diagnosis not present

## 2018-09-22 DIAGNOSIS — Z79899 Other long term (current) drug therapy: Secondary | ICD-10-CM | POA: Insufficient documentation

## 2018-09-22 DIAGNOSIS — I10 Essential (primary) hypertension: Secondary | ICD-10-CM | POA: Insufficient documentation

## 2018-09-22 DIAGNOSIS — S3723XA Laceration of bladder, initial encounter: Secondary | ICD-10-CM | POA: Insufficient documentation

## 2018-09-22 DIAGNOSIS — E1165 Type 2 diabetes mellitus with hyperglycemia: Secondary | ICD-10-CM | POA: Insufficient documentation

## 2018-09-22 DIAGNOSIS — M9711XA Periprosthetic fracture around internal prosthetic right knee joint, initial encounter: Secondary | ICD-10-CM | POA: Diagnosis not present

## 2018-09-22 DIAGNOSIS — E871 Hypo-osmolality and hyponatremia: Secondary | ICD-10-CM | POA: Diagnosis not present

## 2018-09-22 DIAGNOSIS — M1711 Unilateral primary osteoarthritis, right knee: Secondary | ICD-10-CM | POA: Insufficient documentation

## 2018-09-22 DIAGNOSIS — Z1159 Encounter for screening for other viral diseases: Secondary | ICD-10-CM | POA: Insufficient documentation

## 2018-09-22 DIAGNOSIS — Y842 Radiological procedure and radiotherapy as the cause of abnormal reaction of the patient, or of later complication, without mention of misadventure at the time of the procedure: Secondary | ICD-10-CM | POA: Insufficient documentation

## 2018-09-22 DIAGNOSIS — R319 Hematuria, unspecified: Secondary | ICD-10-CM | POA: Insufficient documentation

## 2018-09-22 DIAGNOSIS — Z923 Personal history of irradiation: Secondary | ICD-10-CM | POA: Insufficient documentation

## 2018-09-22 DIAGNOSIS — E785 Hyperlipidemia, unspecified: Secondary | ICD-10-CM | POA: Insufficient documentation

## 2018-09-22 DIAGNOSIS — F329 Major depressive disorder, single episode, unspecified: Secondary | ICD-10-CM | POA: Insufficient documentation

## 2018-09-22 DIAGNOSIS — Z794 Long term (current) use of insulin: Secondary | ICD-10-CM | POA: Insufficient documentation

## 2018-09-22 DIAGNOSIS — K7200 Acute and subacute hepatic failure without coma: Secondary | ICD-10-CM | POA: Diagnosis not present

## 2018-09-22 DIAGNOSIS — G8918 Other acute postprocedural pain: Secondary | ICD-10-CM | POA: Diagnosis not present

## 2018-09-22 DIAGNOSIS — Z96651 Presence of right artificial knee joint: Secondary | ICD-10-CM

## 2018-09-22 HISTORY — PX: TOTAL KNEE ARTHROPLASTY: SHX125

## 2018-09-22 HISTORY — DX: Thrombocytopenia, unspecified: D69.6

## 2018-09-22 LAB — GLUCOSE, CAPILLARY
Glucose-Capillary: 160 mg/dL — ABNORMAL HIGH (ref 70–99)
Glucose-Capillary: 162 mg/dL — ABNORMAL HIGH (ref 70–99)
Glucose-Capillary: 209 mg/dL — ABNORMAL HIGH (ref 70–99)
Glucose-Capillary: 340 mg/dL — ABNORMAL HIGH (ref 70–99)
Glucose-Capillary: 303 mg/dL — ABNORMAL HIGH (ref 70–99)

## 2018-09-22 LAB — URINALYSIS, ROUTINE W REFLEX MICROSCOPIC
Bilirubin Urine: NEGATIVE
Glucose, UA: NEGATIVE mg/dL
Ketones, ur: NEGATIVE mg/dL
Leukocytes,Ua: NEGATIVE
Nitrite: NEGATIVE
Protein, ur: NEGATIVE mg/dL
Specific Gravity, Urine: <1.005 — ABNORMAL LOW (ref 1.005–1.030)
pH: 6.5 (ref 5.0–8.0)

## 2018-09-22 LAB — URINALYSIS, MICROSCOPIC (REFLEX)

## 2018-09-22 LAB — HEMOGLOBIN A1C
Hgb A1c MFr Bld: 6.2 % — ABNORMAL HIGH (ref 4.8–5.6)
Mean Plasma Glucose: 131.24 mg/dL

## 2018-09-22 LAB — TYPE AND SCREEN
ABO/RH(D): A POS
Antibody Screen: NEGATIVE

## 2018-09-22 SURGERY — ARTHROPLASTY, KNEE, TOTAL
Anesthesia: Spinal | Site: Knee | Laterality: Right

## 2018-09-22 MED ORDER — SODIUM CHLORIDE (PF) 0.9 % IJ SOLN
INTRAMUSCULAR | Status: DC | PRN
  Administered 2018-09-22: 30 mL

## 2018-09-22 MED ORDER — ONDANSETRON HCL 4 MG PO TABS: 4.0000 mg | ORAL_TABLET | Freq: Four times a day (QID) | ORAL | Status: DC | PRN

## 2018-09-22 MED ORDER — ALPRAZOLAM 0.25 MG PO TABS
0.2500 mg | ORAL_TABLET | Freq: Two times a day (BID) | ORAL | Status: DC
  Administered 2018-09-22 – 2018-09-23 (×3): 0.25 mg via ORAL
  Filled 2018-09-22 (×3): qty 1

## 2018-09-22 MED ORDER — MIDAZOLAM HCL 5 MG/5ML IJ SOLN
INTRAMUSCULAR | Status: DC | PRN
  Administered 2018-09-22: 2 mg via INTRAVENOUS

## 2018-09-22 MED ORDER — FENTANYL CITRATE (PF) 100 MCG/2ML IJ SOLN
INTRAMUSCULAR | Status: DC | PRN
  Administered 2018-09-22 (×2): 50 ug via INTRAVENOUS

## 2018-09-22 MED ORDER — VANCOMYCIN HCL 1000 MG IV SOLR
INTRAVENOUS | Status: DC | PRN
  Administered 2018-09-22: 1000 mg

## 2018-09-22 MED ORDER — HYDROCODONE-ACETAMINOPHEN 5-325 MG PO TABS
1.0000 | ORAL_TABLET | ORAL | Status: DC | PRN
  Administered 2018-09-22: 12:00:00 2 via ORAL
  Administered 2018-09-22: 1 via ORAL
  Filled 2018-09-22: qty 2
  Filled 2018-09-22: qty 1

## 2018-09-22 MED ORDER — DIPHENHYDRAMINE HCL 12.5 MG/5ML PO ELIX: 12.5000 mg | ORAL_SOLUTION | ORAL | Status: DC | PRN

## 2018-09-22 MED ORDER — STERILE WATER FOR IRRIGATION IR SOLN
Status: DC | PRN
  Administered 2018-09-22 (×2): 1000 mL

## 2018-09-22 MED ORDER — ASPIRIN 81 MG PO CHEW
81.0000 mg | CHEWABLE_TABLET | Freq: Two times a day (BID) | ORAL | Status: DC
  Administered 2018-09-22 – 2018-09-23 (×2): 81 mg via ORAL
  Filled 2018-09-22 (×3): qty 1

## 2018-09-22 MED ORDER — MEPERIDINE HCL 50 MG/ML IJ SOLN: 6.2500 mg | INTRAMUSCULAR | Status: DC | PRN

## 2018-09-22 MED ORDER — DEXAMETHASONE SODIUM PHOSPHATE 10 MG/ML IJ SOLN: 10.0000 mg | Freq: Once | INTRAMUSCULAR | Status: DC

## 2018-09-22 MED ORDER — HYDROCODONE-ACETAMINOPHEN 7.5-325 MG PO TABS: 1.0000 | ORAL_TABLET | ORAL | 0 refills | Status: DC | PRN

## 2018-09-22 MED ORDER — BISACODYL 10 MG RE SUPP: 10.0000 mg | Freq: Every day | RECTAL | Status: DC | PRN

## 2018-09-22 MED ORDER — METOCLOPRAMIDE HCL 5 MG PO TABS: 5.0000 mg | ORAL_TABLET | Freq: Three times a day (TID) | ORAL | Status: DC | PRN

## 2018-09-22 MED ORDER — 0.9 % SODIUM CHLORIDE (POUR BTL) OPTIME
TOPICAL | Status: DC | PRN
  Administered 2018-09-22: 1000 mL

## 2018-09-22 MED ORDER — VANCOMYCIN HCL 1000 MG IV SOLR
INTRAVENOUS | Status: AC
  Filled 2018-09-22: qty 1000

## 2018-09-22 MED ORDER — DILTIAZEM HCL ER COATED BEADS 180 MG PO CP24
180.0000 mg | ORAL_CAPSULE | Freq: Every day | ORAL | Status: DC
  Administered 2018-09-23: 180 mg via ORAL
  Filled 2018-09-22: qty 1

## 2018-09-22 MED ORDER — HYDROCODONE-ACETAMINOPHEN 7.5-325 MG PO TABS
1.0000 | ORAL_TABLET | ORAL | Status: DC | PRN
  Administered 2018-09-23: 1 via ORAL
  Administered 2018-09-23: 2 via ORAL
  Administered 2018-09-23: 1 via ORAL
  Filled 2018-09-22 (×2): qty 1
  Filled 2018-09-22: qty 2

## 2018-09-22 MED ORDER — SODIUM CHLORIDE 0.9 % IV SOLN
INTRAVENOUS | Status: DC | PRN
  Administered 2018-09-22: 20 ug/min via INTRAVENOUS

## 2018-09-22 MED ORDER — KETOROLAC TROMETHAMINE 30 MG/ML IJ SOLN
INTRAMUSCULAR | Status: AC
  Filled 2018-09-22: qty 1

## 2018-09-22 MED ORDER — MIDAZOLAM HCL 2 MG/2ML IJ SOLN
INTRAMUSCULAR | Status: AC
  Filled 2018-09-22: qty 2

## 2018-09-22 MED ORDER — FLUTICASONE PROPIONATE 50 MCG/ACT NA SUSP
1.0000 | Freq: Every day | NASAL | Status: DC
  Filled 2018-09-22: qty 16

## 2018-09-22 MED ORDER — LORATADINE 10 MG PO TABS
10.0000 mg | ORAL_TABLET | Freq: Every day | ORAL | Status: DC
  Administered 2018-09-23: 09:00:00 10 mg via ORAL
  Filled 2018-09-22: qty 1

## 2018-09-22 MED ORDER — SODIUM CHLORIDE 0.9 % IV SOLN
INTRAVENOUS | Status: DC
  Administered 2018-09-22 – 2018-09-23 (×2): via INTRAVENOUS

## 2018-09-22 MED ORDER — CEFAZOLIN SODIUM-DEXTROSE 2-4 GM/100ML-% IV SOLN
2.0000 g | Freq: Four times a day (QID) | INTRAVENOUS | Status: AC
  Administered 2018-09-22 (×2): 2 g via INTRAVENOUS
  Filled 2018-09-22 (×2): qty 100

## 2018-09-22 MED ORDER — INSULIN GLARGINE 100 UNIT/ML ~~LOC~~ SOLN
65.0000 [IU] | Freq: Every day | SUBCUTANEOUS | Status: DC
  Administered 2018-09-22 – 2018-09-23 (×2): 65 [IU] via SUBCUTANEOUS
  Filled 2018-09-22 (×2): qty 0.65

## 2018-09-22 MED ORDER — LIDOCAINE 2% (20 MG/ML) 5 ML SYRINGE
INTRAMUSCULAR | Status: AC
  Filled 2018-09-22: qty 5

## 2018-09-22 MED ORDER — ACETAMINOPHEN 325 MG PO TABS: 325.0000 mg | ORAL_TABLET | Freq: Four times a day (QID) | ORAL | Status: DC | PRN

## 2018-09-22 MED ORDER — SITAGLIPTIN PHOS-METFORMIN HCL 50-500 MG PO TABS: 1.0000 | ORAL_TABLET | Freq: Two times a day (BID) | ORAL | Status: DC

## 2018-09-22 MED ORDER — MORPHINE SULFATE (PF) 2 MG/ML IV SOLN: 0.5000 mg | INTRAVENOUS | Status: DC | PRN

## 2018-09-22 MED ORDER — FERROUS SULFATE 325 (65 FE) MG PO TABS
325.0000 mg | ORAL_TABLET | Freq: Two times a day (BID) | ORAL | Status: DC
  Administered 2018-09-22 – 2018-09-23 (×2): 325 mg via ORAL
  Filled 2018-09-22 (×2): qty 1

## 2018-09-22 MED ORDER — CELECOXIB 200 MG PO CAPS
200.0000 mg | ORAL_CAPSULE | Freq: Two times a day (BID) | ORAL | Status: DC
  Administered 2018-09-22 – 2018-09-23 (×2): 200 mg via ORAL
  Filled 2018-09-22 (×2): qty 1

## 2018-09-22 MED ORDER — FENTANYL CITRATE (PF) 100 MCG/2ML IJ SOLN: 25.0000 ug | INTRAMUSCULAR | Status: DC | PRN

## 2018-09-22 MED ORDER — METHOCARBAMOL 500 MG PO TABS
500.0000 mg | ORAL_TABLET | Freq: Four times a day (QID) | ORAL | Status: DC | PRN
  Administered 2018-09-23: 500 mg via ORAL
  Filled 2018-09-22: qty 1

## 2018-09-22 MED ORDER — POVIDONE-IODINE 10 % EX SWAB
2.0000 "application " | Freq: Once | CUTANEOUS | Status: AC
  Administered 2018-09-22: 2 via TOPICAL

## 2018-09-22 MED ORDER — PROPOFOL 10 MG/ML IV BOLUS
INTRAVENOUS | Status: AC
  Filled 2018-09-22: qty 40

## 2018-09-22 MED ORDER — ALBUTEROL SULFATE (2.5 MG/3ML) 0.083% IN NEBU: 3.0000 mL | INHALATION_SOLUTION | RESPIRATORY_TRACT | Status: DC | PRN

## 2018-09-22 MED ORDER — INSULIN ASPART 100 UNIT/ML ~~LOC~~ SOLN
0.0000 [IU] | Freq: Three times a day (TID) | SUBCUTANEOUS | Status: DC
  Administered 2018-09-22: 16:00:00 11 [IU] via SUBCUTANEOUS
  Administered 2018-09-22 – 2018-09-23 (×3): 5 [IU] via SUBCUTANEOUS

## 2018-09-22 MED ORDER — CITALOPRAM HYDROBROMIDE 20 MG PO TABS
40.0000 mg | ORAL_TABLET | Freq: Every day | ORAL | Status: DC
  Administered 2018-09-23: 40 mg via ORAL
  Filled 2018-09-22: qty 2

## 2018-09-22 MED ORDER — POLYETHYLENE GLYCOL 3350 17 G PO PACK
17.0000 g | PACK | Freq: Two times a day (BID) | ORAL | Status: DC
  Administered 2018-09-22 – 2018-09-23 (×2): 17 g via ORAL
  Filled 2018-09-22 (×2): qty 1

## 2018-09-22 MED ORDER — ROCURONIUM BROMIDE 10 MG/ML (PF) SYRINGE
PREFILLED_SYRINGE | INTRAVENOUS | Status: AC
  Filled 2018-09-22: qty 10

## 2018-09-22 MED ORDER — CHLORHEXIDINE GLUCONATE 4 % EX LIQD: 60.0000 mL | Freq: Once | CUTANEOUS | Status: DC

## 2018-09-22 MED ORDER — FERROUS SULFATE 325 (65 FE) MG PO TABS: 325.0000 mg | ORAL_TABLET | Freq: Two times a day (BID) | ORAL | 0 refills | Status: DC

## 2018-09-22 MED ORDER — SUCCINYLCHOLINE CHLORIDE 200 MG/10ML IV SOSY
PREFILLED_SYRINGE | INTRAVENOUS | Status: AC
  Filled 2018-09-22: qty 10

## 2018-09-22 MED ORDER — PHENOL 1.4 % MT LIQD
1.0000 | OROMUCOSAL | Status: DC | PRN
  Filled 2018-09-22: qty 177

## 2018-09-22 MED ORDER — SODIUM CHLORIDE (PF) 0.9 % IJ SOLN
INTRAMUSCULAR | Status: AC
  Filled 2018-09-22: qty 50

## 2018-09-22 MED ORDER — METFORMIN HCL 500 MG PO TABS
500.0000 mg | ORAL_TABLET | Freq: Two times a day (BID) | ORAL | Status: DC
  Administered 2018-09-22 – 2018-09-23 (×2): 500 mg via ORAL
  Filled 2018-09-22 (×2): qty 1

## 2018-09-22 MED ORDER — KETOROLAC TROMETHAMINE 30 MG/ML IJ SOLN
INTRAMUSCULAR | Status: DC | PRN
  Administered 2018-09-22: 30 mg via INTRAVENOUS

## 2018-09-22 MED ORDER — BUPIVACAINE-EPINEPHRINE (PF) 0.5% -1:200000 IJ SOLN
INTRAMUSCULAR | Status: DC | PRN
  Administered 2018-09-22: 30 mL via PERINEURAL

## 2018-09-22 MED ORDER — METOCLOPRAMIDE HCL 5 MG/ML IJ SOLN: 5.0000 mg | Freq: Three times a day (TID) | INTRAMUSCULAR | Status: DC | PRN

## 2018-09-22 MED ORDER — LACTATED RINGERS IV SOLN
INTRAVENOUS | Status: DC
  Administered 2018-09-22 (×2): via INTRAVENOUS

## 2018-09-22 MED ORDER — BUPIVACAINE-EPINEPHRINE (PF) 0.25% -1:200000 IJ SOLN
INTRAMUSCULAR | Status: DC | PRN
  Administered 2018-09-22: 30 mL via PERINEURAL

## 2018-09-22 MED ORDER — CEFAZOLIN SODIUM-DEXTROSE 2-4 GM/100ML-% IV SOLN
2.0000 g | INTRAVENOUS | Status: AC
  Administered 2018-09-22: 2 g via INTRAVENOUS
  Filled 2018-09-22: qty 100

## 2018-09-22 MED ORDER — BUPIVACAINE-EPINEPHRINE (PF) 0.25% -1:200000 IJ SOLN
INTRAMUSCULAR | Status: AC
  Filled 2018-09-22: qty 30

## 2018-09-22 MED ORDER — PROPOFOL 10 MG/ML IV BOLUS
INTRAVENOUS | Status: AC
  Filled 2018-09-22: qty 20

## 2018-09-22 MED ORDER — GLIPIZIDE 5 MG PO TABS
5.0000 mg | ORAL_TABLET | Freq: Two times a day (BID) | ORAL | Status: DC
  Administered 2018-09-22 – 2018-09-23 (×3): 5 mg via ORAL
  Filled 2018-09-22 (×3): qty 1

## 2018-09-22 MED ORDER — DEXAMETHASONE SODIUM PHOSPHATE 10 MG/ML IJ SOLN
INTRAMUSCULAR | Status: AC
  Filled 2018-09-22: qty 1

## 2018-09-22 MED ORDER — SODIUM CHLORIDE 0.9 % IR SOLN
Status: DC | PRN
  Administered 2018-09-22: 1000 mL

## 2018-09-22 MED ORDER — METHOCARBAMOL 500 MG IVPB - SIMPLE MED
500.0000 mg | Freq: Four times a day (QID) | INTRAVENOUS | Status: DC | PRN
  Filled 2018-09-22: qty 50

## 2018-09-22 MED ORDER — ONDANSETRON HCL 4 MG/2ML IJ SOLN: 4.0000 mg | Freq: Four times a day (QID) | INTRAMUSCULAR | Status: DC | PRN

## 2018-09-22 MED ORDER — HYDROCHLOROTHIAZIDE 12.5 MG PO CAPS
12.5000 mg | ORAL_CAPSULE | Freq: Every day | ORAL | Status: DC
  Administered 2018-09-23: 12.5 mg via ORAL
  Filled 2018-09-22: qty 1

## 2018-09-22 MED ORDER — ONDANSETRON HCL 4 MG/2ML IJ SOLN: 4.0000 mg | Freq: Once | INTRAMUSCULAR | Status: DC | PRN

## 2018-09-22 MED ORDER — CELECOXIB 200 MG PO CAPS: 200.0000 mg | ORAL_CAPSULE | Freq: Two times a day (BID) | ORAL | 0 refills | Status: DC

## 2018-09-22 MED ORDER — BUPIVACAINE IN DEXTROSE 0.75-8.25 % IT SOLN
INTRATHECAL | Status: DC | PRN
  Administered 2018-09-22: 1.5 mL via INTRATHECAL

## 2018-09-22 MED ORDER — ONDANSETRON HCL 4 MG/2ML IJ SOLN
INTRAMUSCULAR | Status: AC
  Filled 2018-09-22: qty 2

## 2018-09-22 MED ORDER — LINAGLIPTIN 5 MG PO TABS
5.0000 mg | ORAL_TABLET | Freq: Every day | ORAL | Status: DC
  Administered 2018-09-22 – 2018-09-23 (×2): 5 mg via ORAL
  Filled 2018-09-22 (×2): qty 1

## 2018-09-22 MED ORDER — ASPIRIN 81 MG PO CHEW: 81.0000 mg | CHEWABLE_TABLET | Freq: Two times a day (BID) | ORAL | 0 refills | Status: DC

## 2018-09-22 MED ORDER — DEXAMETHASONE SODIUM PHOSPHATE 10 MG/ML IJ SOLN
INTRAMUSCULAR | Status: DC | PRN
  Administered 2018-09-22: 8 mg via INTRAVENOUS

## 2018-09-22 MED ORDER — ATENOLOL 50 MG PO TABS
50.0000 mg | ORAL_TABLET | Freq: Every day | ORAL | Status: DC
  Administered 2018-09-23: 50 mg via ORAL
  Filled 2018-09-22: qty 1

## 2018-09-22 MED ORDER — CEPHALEXIN 500 MG PO CAPS: 500.0000 mg | ORAL_CAPSULE | Freq: Three times a day (TID) | ORAL | 0 refills | Status: DC

## 2018-09-22 MED ORDER — ONDANSETRON HCL 4 MG/2ML IJ SOLN
INTRAMUSCULAR | Status: DC | PRN
  Administered 2018-09-22: 4 mg via INTRAVENOUS

## 2018-09-22 MED ORDER — TRANEXAMIC ACID-NACL 1000-0.7 MG/100ML-% IV SOLN
1000.0000 mg | INTRAVENOUS | Status: AC
  Administered 2018-09-22: 1000 mg via INTRAVENOUS
  Filled 2018-09-22: qty 100

## 2018-09-22 MED ORDER — METHOCARBAMOL 500 MG PO TABS: 500.0000 mg | ORAL_TABLET | Freq: Four times a day (QID) | ORAL | 0 refills | Status: DC | PRN

## 2018-09-22 MED ORDER — PROPOFOL 500 MG/50ML IV EMUL
INTRAVENOUS | Status: DC | PRN
  Administered 2018-09-22: 75 ug/kg/min via INTRAVENOUS

## 2018-09-22 MED ORDER — SIMVASTATIN 40 MG PO TABS
40.0000 mg | ORAL_TABLET | Freq: Every day | ORAL | Status: DC
  Administered 2018-09-22: 40 mg via ORAL
  Filled 2018-09-22: qty 1

## 2018-09-22 MED ORDER — PROPOFOL 10 MG/ML IV BOLUS
INTRAVENOUS | Status: DC | PRN
  Administered 2018-09-22: 20 mg via INTRAVENOUS

## 2018-09-22 MED ORDER — FENTANYL CITRATE (PF) 250 MCG/5ML IJ SOLN
INTRAMUSCULAR | Status: AC
  Filled 2018-09-22: qty 5

## 2018-09-22 MED ORDER — MAGNESIUM CITRATE PO SOLN: 1.0000 | Freq: Once | ORAL | Status: DC | PRN

## 2018-09-22 MED ORDER — DOCUSATE SODIUM 100 MG PO CAPS
100.0000 mg | ORAL_CAPSULE | Freq: Two times a day (BID) | ORAL | Status: DC
  Administered 2018-09-22 – 2018-09-23 (×2): 100 mg via ORAL
  Filled 2018-09-22 (×2): qty 1

## 2018-09-22 MED ORDER — DEXAMETHASONE SODIUM PHOSPHATE 10 MG/ML IJ SOLN
10.0000 mg | Freq: Once | INTRAMUSCULAR | Status: AC
  Administered 2018-09-23: 10 mg via INTRAVENOUS
  Filled 2018-09-22: qty 1

## 2018-09-22 MED ORDER — ALUM & MAG HYDROXIDE-SIMETH 200-200-20 MG/5ML PO SUSP: 15.0000 mL | ORAL | Status: DC | PRN

## 2018-09-22 MED ORDER — MONTELUKAST SODIUM 10 MG PO TABS
10.0000 mg | ORAL_TABLET | Freq: Every day | ORAL | Status: DC
  Administered 2018-09-22: 10 mg via ORAL
  Filled 2018-09-22: qty 1

## 2018-09-22 MED ORDER — TRANEXAMIC ACID-NACL 1000-0.7 MG/100ML-% IV SOLN
1000.0000 mg | Freq: Once | INTRAVENOUS | Status: AC
  Administered 2018-09-22: 12:00:00 1000 mg via INTRAVENOUS
  Filled 2018-09-22: qty 100

## 2018-09-22 MED ORDER — MENTHOL 3 MG MT LOZG: 1.0000 | LOZENGE | OROMUCOSAL | Status: DC | PRN

## 2018-09-22 MED ORDER — BASAGLAR KWIKPEN 100 UNIT/ML ~~LOC~~ SOPN: 65.0000 [IU] | PEN_INJECTOR | Freq: Every day | SUBCUTANEOUS | Status: DC

## 2018-09-22 SURGICAL SUPPLY — 57 items
ATTUNE MED ANAT PAT 32 KNEE (Knees) ×1 IMPLANT
ATTUNE PS FEM RT SZ 3 CEM KNEE (Femur) ×1 IMPLANT
ATTUNE PSRP INSR SZ3 6 KNEE (Insert) ×1 IMPLANT
BAG ZIPLOCK 12X15 (MISCELLANEOUS) IMPLANT
BANDAGE ACE 6X5 VEL STRL LF (GAUZE/BANDAGES/DRESSINGS) ×2 IMPLANT
BASEPLATE TIBIAL ROTATING SZ 4 (Knees) ×1 IMPLANT
BLADE SAW SGTL 11.0X1.19X90.0M (BLADE) ×1 IMPLANT
BLADE SAW SGTL 13.0X1.19X90.0M (BLADE) ×2 IMPLANT
BLADE SURG SZ10 CARB STEEL (BLADE) ×4 IMPLANT
BOWL SMART MIX CTS (DISPOSABLE) ×2 IMPLANT
CEMENT HV SMART SET (Cement) ×2 IMPLANT
COVER SURGICAL LIGHT HANDLE (MISCELLANEOUS) ×2 IMPLANT
COVER WAND RF STERILE (DRAPES) IMPLANT
CUFF TOURN SGL QUICK 34 (TOURNIQUET CUFF) ×1
CUFF TRNQT CYL 34X4.125X (TOURNIQUET CUFF) ×1 IMPLANT
DECANTER SPIKE VIAL GLASS SM (MISCELLANEOUS) ×4 IMPLANT
DERMABOND ADVANCED (GAUZE/BANDAGES/DRESSINGS) ×1
DERMABOND ADVANCED .7 DNX12 (GAUZE/BANDAGES/DRESSINGS) ×1 IMPLANT
DRAPE U-SHAPE 47X51 STRL (DRAPES) ×2 IMPLANT
DRESSING AQUACEL AG SP 3.5X10 (GAUZE/BANDAGES/DRESSINGS) ×1 IMPLANT
DRSG AQUACEL AG SP 3.5X10 (GAUZE/BANDAGES/DRESSINGS) ×2
DURAPREP 26ML APPLICATOR (WOUND CARE) ×4 IMPLANT
ELECT REM PT RETURN 15FT ADLT (MISCELLANEOUS) ×2 IMPLANT
GLOVE BIO SURGEON STRL SZ 6 (GLOVE) ×2 IMPLANT
GLOVE BIOGEL PI IND STRL 6.5 (GLOVE) ×1 IMPLANT
GLOVE BIOGEL PI IND STRL 7.5 (GLOVE) ×1 IMPLANT
GLOVE BIOGEL PI IND STRL 8.5 (GLOVE) ×1 IMPLANT
GLOVE BIOGEL PI INDICATOR 6.5 (GLOVE) ×1
GLOVE BIOGEL PI INDICATOR 7.5 (GLOVE) ×1
GLOVE BIOGEL PI INDICATOR 8.5 (GLOVE) ×1
GLOVE ECLIPSE 8.0 STRL XLNG CF (GLOVE) ×2 IMPLANT
GLOVE ORTHO TXT STRL SZ7.5 (GLOVE) ×2 IMPLANT
GOWN STRL REUS W/ TWL LRG LVL3 (GOWN DISPOSABLE) ×1 IMPLANT
GOWN STRL REUS W/TWL 2XL LVL3 (GOWN DISPOSABLE) ×2 IMPLANT
GOWN STRL REUS W/TWL LRG LVL3 (GOWN DISPOSABLE) ×3 IMPLANT
HANDPIECE INTERPULSE COAX TIP (DISPOSABLE) ×1
HOLDER FOLEY CATH W/STRAP (MISCELLANEOUS) IMPLANT
KIT TURNOVER KIT A (KITS) IMPLANT
MANIFOLD NEPTUNE II (INSTRUMENTS) ×2 IMPLANT
NDL SAFETY ECLIPSE 18X1.5 (NEEDLE) IMPLANT
NEEDLE HYPO 18GX1.5 SHARP (NEEDLE)
NS IRRIG 1000ML POUR BTL (IV SOLUTION) ×2 IMPLANT
PACK TOTAL KNEE CUSTOM (KITS) ×2 IMPLANT
PIN THREADED HEADED SIGMA (PIN) ×1 IMPLANT
PROTECTOR NERVE ULNAR (MISCELLANEOUS) ×2 IMPLANT
SET HNDPC FAN SPRY TIP SCT (DISPOSABLE) ×1 IMPLANT
SET PAD KNEE POSITIONER (MISCELLANEOUS) ×2 IMPLANT
SUT MNCRL AB 4-0 PS2 18 (SUTURE) ×2 IMPLANT
SUT STRATAFIX PDS+ 0 24IN (SUTURE) ×2 IMPLANT
SUT VIC AB 1 CT1 36 (SUTURE) ×2 IMPLANT
SUT VIC AB 2-0 CT1 27 (SUTURE) ×3
SUT VIC AB 2-0 CT1 TAPERPNT 27 (SUTURE) ×3 IMPLANT
SYR 3ML LL SCALE MARK (SYRINGE) ×2 IMPLANT
TRAY FOLEY MTR SLVR 16FR STAT (SET/KITS/TRAYS/PACK) ×2 IMPLANT
WATER STERILE IRR 1000ML POUR (IV SOLUTION) ×4 IMPLANT
WRAP KNEE MAXI GEL POST OP (GAUZE/BANDAGES/DRESSINGS) ×2 IMPLANT
YANKAUER SUCT BULB TIP 10FT TU (MISCELLANEOUS) ×2 IMPLANT

## 2018-09-22 NOTE — Interval H&P Note (Signed)
History and Physical Interval Note:  09/22/2018 7:07 AM  [place]  has presented today for surgery, with the diagnosis of Right knee osteoarthritis.  The various methods of treatment have been discussed with the patient and family. After consideration of risks, benefits and other options for treatment, the patient has consented to  Procedure(s) with comments: TOTAL KNEE ARTHROPLASTY (Right) - 70 mins as a surgical intervention.  The patient's history has been reviewed, patient examined, no change in status, stable for surgery.  I have reviewed the patient's chart and labs.  Questions were answered to the patient's satisfaction.     [name]

## 2018-09-22 NOTE — Op Note (Signed)
NAME:  Brittany Russo RECORD NO.:  [id number]                             FACILITY:  [facility]      PHYSICIAN:  Pietro Cassis. Alvan Dame, M.D.  DATE OF BIRTH:  05/18/1945      DATE OF PROCEDURE:  09/22/2018                                     OPERATIVE REPORT         PREOPERATIVE DIAGNOSIS:  Right knee osteoarthritis.      POSTOPERATIVE DIAGNOSIS:  Right knee osteoarthritis.      FINDINGS:  The patient was noted to have complete loss of cartilage and   bone-on-bone arthritis with associated osteophytes in the medial and patellofemoral compartments of   the knee with a significant synovitis and associated effusion.  The patient had failed months of conservative treatment including medications, injection therapy, activity modification.     PROCEDURE:  Right total knee replacement.      COMPONENTS USED:  DePuy Attune rotating platform posterior stabilized knee   system, a size 3 femur, 4 tibia, size 6 mm PS AOX insert, and 32 anatomic patellar   button.      SURGEON:  Pietro Cassis. Alvan Dame, M.D.      ASSISTANT:  Griffith Citron, PA-C.      ANESTHESIA:  Regional and Spinal.      SPECIMENS:  None.      COMPLICATION:  None.      DRAINS:  None.  EBL: <200cc      TOURNIQUET TIME:   Total Tourniquet Time Documented: Thigh (Right) - 31 minutes Total: Thigh (Right) - 31 minutes  .      The patient was stable to the recovery room.      INDICATION FOR PROCEDURE:  Brittany Russo is a 73 y.o. female patient of   mine.  The patient had been seen, evaluated, and treated for months conservatively in the   office with medication, activity modification, and injections.  The patient had   radiographic changes of bone-on-bone arthritis with endplate sclerosis and osteophytes noted.  Based on the radiographic changes and failed conservative measures, the patient   decided to proceed with definitive treatment, total knee replacement.  Risks of infection, DVT, component failure,  need for revision surgery, neurovascular injury were reviewed in the office setting.  The postop course was reviewed stressing the efforts to maximize post-operative satisfaction and function.  Consent was obtained for benefit of pain   relief.      PROCEDURE IN DETAIL:  The patient was brought to the operative theater.   Once adequate anesthesia, preoperative antibiotics, 2 gm of Ancef,1 gm of Tranexamic Acid, and 10 mg of Decadron administered, the patient was positioned supine with a right thigh tourniquet placed.  The  right lower extremity was prepped and draped in sterile fashion.  A time-   out was performed identifying the patient, planned procedure, and the appropriate extremity.      The right lower extremity was placed in the [facility] leg holder.  The leg was   exsanguinated, tourniquet elevated to 250 mmHg.  A midline incision was   made  followed by median parapatellar arthrotomy.  Following initial   exposure, attention was first directed to the patella.  Precut   measurement was noted to be 20-21 mm.  I resected down to 13-14 mm and used a   32 anatomic patellar button to restore patellar height as well as cover the cut surface.      The lug holes were drilled and a metal shim was placed to protect the   patella from retractors and saw blade during the procedure.      At this point, attention was now directed to the femur.  The femoral   canal was opened with a drill, irrigated to try to prevent fat emboli.  An   intramedullary rod was passed at 3 degrees valgus, 8 mm of bone was   resected off the distal femur.  Following this resection, the tibia was   subluxated anteriorly.  Using the extramedullary guide, 2 mm of bone was resected off   the proximal medial tibia.  We confirmed the gap would be   stable medially and laterally with a size 5 spacer block as well as confirmed that the tibial cut was perpendicular in the coronal plane, checking with an alignment rod.      Once  this was done, I sized the femur to be a size 3 in the anterior-   posterior dimension, chose a standard component based on medial and   lateral dimension.  The size 3 rotation block was then pinned in   position anterior referenced using the C-clamp to set rotation.  The   anterior, posterior, and  chamfer cuts were made without difficulty nor   notching making certain that I was along the anterior cortex to help   with flexion gap stability.      The final box cut was made off the lateral aspect of distal femur.      At this point, the tibia was sized to be a size 4.  The size 4 tray was   then pinned in position through the medial third of the tubercle,   drilled, and keel punched.  Trial reduction was now carried with a 3 femur,  4 tibia, a size 5 then 6 mm PS insert, and the 32 anatomic patella botton.  The knee was brought to full extension with good flexion stability with the patella   tracking through the trochlea without application of pressure.  Given   all these findings the trial components removed.  Final components were   opened and cement was mixed.  The knee was irrigated with normal saline solution and pulse lavage.  The synovial lining was   then injected with 30 cc of 0.25% Marcaine with epinephrine, 1 cc of Toradol and 30 cc of NS for a total of 61 cc.     Final implants were then cemented onto cleaned and dried cut surfaces of bone with the knee brought to extension with a size 6 mm PS trial insert.      Once the cement had fully cured, excess cement was removed   throughout the knee.  I confirmed that I was satisfied with the range of   motion and stability, and the final size 6 mm PS AOX insert was chosen.  It was   placed into the knee.      The tourniquet had been let down at 31 minutes.  No significant   hemostasis was required.  The extensor mechanism was then reapproximated using #1  Vicryl and #1 Stratafix sutures with the knee   in flexion.  The   remaining  wound was closed with 2-0 Vicryl and running 4-0 Monocryl.   The knee was cleaned, dried, dressed sterilely using Dermabond and   Aquacel dressing.  The patient was then   brought to recovery room in stable condition, tolerating the procedure   well.   Please note that Physician Assistant, Griffith Citron, PA-C was present for the entirety of the case, and was utilized for pre-operative positioning, peri-operative retractor management, general facilitation of the procedure and for primary wound closure at the end of the case.              Pietro Cassis Alvan Dame, M.D.    09/22/2018 8:54 AM

## 2018-09-22 NOTE — Transfer of Care (Signed)
Immediate Anesthesia Transfer of Care Note  Patient: Brittany Russo  Procedure(s) Performed: TOTAL KNEE ARTHROPLASTY (Right Knee)  Patient Location: PACU  Anesthesia Type:Spinal  Level of Consciousness: awake and alert   Airway & Oxygen Therapy: Patient Spontanous Breathing and Patient connected to face mask oxygen  Post-op Assessment: Report given to RN and Post -op Vital signs reviewed and stable  Post vital signs: Reviewed and stable  Last Vitals:  Vitals Value Taken Time  BP    Temp    Pulse    Resp    SpO2      Last Pain:  Vitals:   09/22/18 0606  TempSrc: Oral  PainSc:       Patients Stated Pain Goal: 4 (63/01/60 1093)  Complications: No apparent anesthesia complications

## 2018-09-22 NOTE — Anesthesia Procedure Notes (Signed)
Spinal  Patient location during procedure: OR Start time: 09/22/2018 7:20 AM End time: 09/22/2018 7:24 AM Staffing Anesthesiologist: Josephine Igo, MD Performed: anesthesiologist  Preanesthetic Checklist Completed: patient identified, site marked, surgical consent, pre-op evaluation, timeout performed, IV checked, risks and benefits discussed and monitors and equipment checked Spinal Block Patient position: sitting Prep: site prepped and draped and DuraPrep Patient monitoring: heart rate, cardiac monitor, continuous pulse ox and blood pressure Approach: midline Location: L3-4 Injection technique: single-shot Needle Needle type: Pencan  Needle gauge: 24 G Needle length: 9 cm Needle insertion depth: 7 cm Assessment Sensory level: T6 Additional Notes Patient tolerated procedure well. Adequate sensory level.

## 2018-09-22 NOTE — Evaluation (Signed)
Physical Therapy Evaluation Patient Details Name: Brittany Russo MRN: [id number] DOB: 1946/01/09 Today's Date: 09/22/2018   History of Present Illness  Pt s/p R TKR and with hx of DM, SZ, endometrial CA, and cirrhosis of the liver  Clinical Impression  Pt s/p R TKR and presents with decreased R LE strength/ROM and post op pain limiting functional mobility.  Pt should progress to dc home with family assist and has first OP PT scheduled for MOnday 09/26/18.    Follow Up Recommendations Follow surgeon's recommendation for DC plan and follow-up therapies    Equipment Recommendations  Rolling walker with 5" wheels;3in1 (PT)    Recommendations for Other Services       Precautions / Restrictions Precautions Precautions: Fall Restrictions Weight Bearing Restrictions: No Other Position/Activity Restrictions: WBAT      Mobility  Bed Mobility Overal bed mobility: Needs Assistance Bed Mobility: Supine to Sit     Supine to sit: Min assist     General bed mobility comments: assist with R LE  Transfers Overall transfer level: Needs assistance Equipment used: Rolling walker (2 wheeled) Transfers: Sit to/from Stand Sit to Stand: Min assist;+2 physical assistance;+2 safety/equipment;From elevated surface         General transfer comment: cues for LE management and use of UEs to self assist  Ambulation/Gait Ambulation/Gait assistance: Min assist;Mod assist Gait Distance (Feet): 34 Feet Assistive device: Rolling walker (2 wheeled) Gait Pattern/deviations: Step-to pattern;Shuffle;Trunk flexed;Decreased step length - right;Decreased step length - left Gait velocity: decr   General Gait Details: cues for sequence, posture and position from [employer]            Wheelchair Mobility    Modified Rankin (Stroke Patients Only)       Balance                                             Pertinent Vitals/Pain Pain Assessment: 0-10 Pain Score: 5  Pain  Location: R knee Pain Descriptors / Indicators: Aching;Sore Pain Intervention(s): Limited activity within patient's tolerance;Monitored during session;Premedicated before session;Ice applied    Home Living Family/patient expects to be discharged to:: Private residence Living Arrangements: Spouse/significant other Available Help at Discharge: Family Type of Home: Apartment Home Access: Level entry;Elevator     Home Layout: One level Home Equipment: Walker - 4 wheels      Prior Function Level of Independence: Independent;Independent with assistive device(s)         Comments: using rollator as needed     Hand Dominance        Extremity/Trunk Assessment   Upper Extremity Assessment Upper Extremity Assessment: Overall WFL for tasks assessed    Lower Extremity Assessment Lower Extremity Assessment: RLE deficits/detail    Cervical / Trunk Assessment Cervical / Trunk Assessment: Normal  Communication   Communication: No difficulties  Cognition Arousal/Alertness: Awake/alert Behavior During Therapy: WFL for tasks assessed/performed Overall Cognitive Status: Within Functional Limits for tasks assessed                                        General Comments      Exercises Total Joint Exercises Ankle Circles/Pumps: AROM;Both;15 reps;Supine   Assessment/Plan    PT Assessment Patient needs continued PT services  PT Problem List Decreased strength;Decreased range  of motion;Decreased activity tolerance;Decreased mobility;Obesity;Pain;Decreased knowledge of use of DME       PT Treatment Interventions DME instruction;Gait training;Stair training;Functional mobility training;Therapeutic activities;Therapeutic exercise;Patient/family education    PT Goals (Current goals can be found in the Care Plan section)  Acute Rehab PT Goals Patient Stated Goal: Regain IND PT Goal Formulation: With patient Time For Goal Achievement: 09/29/18 Potential to Achieve  Goals: Good    Frequency 7X/week   Barriers to discharge        Co-evaluation               AM-PAC PT "6 Clicks" Mobility  Outcome Measure Help needed turning from your back to your side while in a flat bed without using bedrails?: A Little Help needed moving from lying on your back to sitting on the side of a flat bed without using bedrails?: A Little Help needed moving to and from a bed to a chair (including a wheelchair)?: A Little Help needed standing up from a chair using your arms (e.g., wheelchair or bedside chair)?: A Little Help needed to walk in hospital room?: A Little Help needed climbing 3-5 steps with a railing? : A Lot 6 Click Score: 17    End of Session Equipment Utilized During Treatment: Gait belt Activity Tolerance: Patient tolerated treatment well Patient left: in chair;with call bell/phone within reach;with chair alarm set;with nursing/sitter in room Nurse Communication: Mobility status PT Visit Diagnosis: Difficulty in walking, not elsewhere classified (R26.2)    Time: 2257-5051 PT Time Calculation (min) (ACUTE ONLY): 35 min   Charges:   PT Evaluation $PT Eval Low Complexity: 1 Low PT Treatments $Gait Training: 8-22 mins        [place] Pager [phone] Office [phone]   Harlow Basley 09/22/2018, 4:45 PM

## 2018-09-22 NOTE — Anesthesia Procedure Notes (Signed)
Anesthesia Regional Block: Adductor canal block   Pre-Anesthetic Checklist: ,, timeout performed, Correct Patient, Correct Site, Correct Laterality, Correct Procedure, Correct Position, site marked, Risks and benefits discussed,  Surgical consent,  Pre-op evaluation,  At surgeon's request and post-op pain management  Laterality: Right  Prep: chloraprep       Needles:  Injection technique: Single-shot  Needle Type: Echogenic Stimulator Needle     Needle Length: 9cm  Needle Gauge: 21   Needle insertion depth: 8 cm   Additional Needles:   Procedures:,,,, ultrasound used (permanent image in chart),,,,  Narrative:  Start time: 09/22/2018 6:58 AM End time: 09/22/2018 7:03 AM Injection made incrementally with aspirations every 5 mL.  Performed by: Personally  Anesthesiologist: Josephine Igo, MD  Additional Notes: Timeout performed. Patient sedated. Relevant anatomy ID'd using [place]. Incremental 2-60ml injection of LA with frequent aspiration. Patient tolerated procedure well.

## 2018-09-22 NOTE — Care Plan (Signed)
Ortho Bundle Case Management Note  Patient Details  Name: Brittany Russo MRN: [id number] Date of Birth: January 11, 1946  R TKA on 09-22-18 DCP:  Home with spouse and dtr.  3rd floor apt with elevator.  No ste. DME:  RW and 3-in-1 ordered through [place] PT:  [facility] [employer]  PT eval scheduled on 09-26-18.                   DME Arranged:  Walker rolling, 3-N-1 DME Agency:  [employer]  HH Arranged:  NA HH Agency:  NA  Additional Comments: Please contact me with any questions of if this plan should need to change.  Marianne Sofia, RN,CCM [facility]  [phone] 09/22/2018, 9:41 AM

## 2018-09-22 NOTE — Progress Notes (Signed)
Patient DME (walker and 3in1) ordered through [employer].  CSW contact: Zack Blank (09/22/2018 @2 :17pm) CSW notified orthopedic CM Jill.  Kathrin Greathouse, Marlinda Mike, MSW Clinical Social Worker  [phone] 09/22/2018  2:48 PM

## 2018-09-22 NOTE — Anesthesia Procedure Notes (Signed)
Date/Time: 09/22/2018 7:15 AM Performed by: Sharlette Dense, CRNA Oxygen Delivery Method: Simple face mask

## 2018-09-22 NOTE — Discharge Instructions (Signed)

## 2018-09-22 NOTE — Anesthesia Postprocedure Evaluation (Addendum)
Anesthesia Post Note  Patient: Brittany Russo  Procedure(s) Performed: TOTAL KNEE ARTHROPLASTY (Right Knee)     Patient location during evaluation: PACU Anesthesia Type: Spinal Level of consciousness: oriented and awake and alert Pain management: pain level controlled Vital Signs Assessment: post-procedure vital signs reviewed and stable Respiratory status: spontaneous breathing, respiratory function stable, patient connected to nasal cannula oxygen and nonlabored ventilation Cardiovascular status: blood pressure returned to baseline and stable Postop Assessment: no headache, no backache, no apparent nausea or vomiting and spinal receding Anesthetic complications: no    Last Vitals:  Vitals:   09/22/18 1000 09/22/18 1015  BP: (!) 160/76 (!) 160/75  Pulse: 66 67  Resp: 11 11  Temp:    SpO2: 99% 100%    Last Pain:  Vitals:   09/22/18 1000  TempSrc:   PainSc: 0-No pain                 [name] A.

## 2018-09-23 LAB — BASIC METABOLIC PANEL
Anion gap: 6 (ref 5–15)
BUN: 15 mg/dL (ref 8–23)
CO2: 25 mmol/L (ref 22–32)
Calcium: 8.4 mg/dL — ABNORMAL LOW (ref 8.9–10.3)
Chloride: 104 mmol/L (ref 98–111)
Creatinine, Ser: 0.7 mg/dL (ref 0.44–1.00)
GFR calc Af Amer: >60 mL/min (ref >60)
GFR calc non Af Amer: >60 mL/min (ref >60)
Glucose, Bld: 273 mg/dL — ABNORMAL HIGH (ref 70–99)
Potassium: 4.1 mmol/L (ref 3.5–5.1)
Sodium: 135 mmol/L (ref 135–145)

## 2018-09-23 LAB — CBC
HCT: 27.8 % — ABNORMAL LOW (ref 36.0–46.0)
Hemoglobin: 9 g/dL — ABNORMAL LOW (ref 12.0–15.0)
MCH: 28.8 pg (ref 26.0–34.0)
MCHC: 32.4 g/dL (ref 30.0–36.0)
MCV: 89.1 fL (ref 80.0–100.0)
Platelets: 58 10*3/uL — ABNORMAL LOW (ref 150–400)
RBC: 3.12 MIL/uL — ABNORMAL LOW (ref 3.87–5.11)
RDW: 12.8 % (ref 11.5–15.5)
WBC: 5.1 10*3/uL (ref 4.0–10.5)
nRBC: 0 % (ref 0.0–0.2)

## 2018-09-23 LAB — URINE CULTURE: Culture: <10000 — AB

## 2018-09-23 LAB — GLUCOSE, CAPILLARY
Glucose-Capillary: 240 mg/dL — ABNORMAL HIGH (ref 70–99)
Glucose-Capillary: 257 mg/dL — ABNORMAL HIGH (ref 70–99)

## 2018-09-23 NOTE — Progress Notes (Signed)
Reviewed discharge instructions, medications and education. Patient verbalizes understanding and has no further questions.

## 2018-09-23 NOTE — TOC Transition Note (Signed)
Transition of Care [facility]) - CM/SW Discharge Note   Patient Details  Name: Brittany Russo MRN: [id number] Date of Birth: 06-02-1945  [facility]) CM/SW Contact:  Lynnell Catalan, RN Phone Number: 09/23/2018, 2:42 PM   Clinical Narrative:     [employer] liaison Jeneen Rinks alerted of orders for DME 3in1 and youth RW.  Marney Doctor RN,BSN [phone]

## 2018-09-23 NOTE — Progress Notes (Signed)
Physical Therapy Treatment Patient Details Name: Brittany Russo MRN: [id number] DOB: 05-18-1945 Today's Date: 09/23/2018    History of Present Illness Pt s/p R TKR and with hx of DM, SZ, endometrial CA, and cirrhosis of the liver    PT Comments    Pt motivated and progressing with mobility but requiring increased time for all tasks.  Pt initiated home therex program and ambulated increased distance in halls this am but requiring assist for balance.  Follow Up Recommendations  Follow surgeon's recommendation for DC plan and follow-up therapies     Equipment Recommendations  Rolling walker with 5" wheels;3in1 (PT)    Recommendations for Other Services       Precautions / Restrictions Precautions Precautions: Fall Restrictions Weight Bearing Restrictions: No Other Position/Activity Restrictions: WBAT    Mobility  Bed Mobility Overal bed mobility: Needs Assistance Bed Mobility: Supine to Sit     Supine to sit: Min guard     General bed mobility comments: Increased time with assist with R LE  Transfers Overall transfer level: Needs assistance Equipment used: Rolling walker (2 wheeled) Transfers: Sit to/from Stand Sit to Stand: Min assist         General transfer comment: cues for LE management and use of UEs to self assist; physical assist to bring wt and fwd and to balance in initial standing  Ambulation/Gait Ambulation/Gait assistance: Min assist Gait Distance (Feet): 111 Feet Assistive device: Rolling walker (2 wheeled) Gait Pattern/deviations: Step-to pattern;Shuffle;Trunk flexed;Decreased step length - right;Decreased step length - left Gait velocity: decr   General Gait Details: cues for sequence, posture and position from [employer]             Wheelchair Mobility    Modified Rankin (Stroke Patients Only)       Balance Overall balance assessment: Needs assistance Sitting-balance support: No upper extremity supported;Feet  supported Sitting balance-Leahy Scale: Good     Standing balance support: Bilateral upper extremity supported Standing balance-Leahy Scale: Poor Standing balance comment: balance loss with initial standing and reliant on UEs throughout session for stability                            Cognition Arousal/Alertness: Awake/alert Behavior During Therapy: WFL for tasks assessed/performed Overall Cognitive Status: Within Functional Limits for tasks assessed                                        Exercises Total Joint Exercises Ankle Circles/Pumps: AROM;Both;15 reps;Supine Quad Sets: AROM;Both;10 reps;Supine Heel Slides: AAROM;Right;15 reps;Supine Straight Leg Raises: AAROM;AROM;Right;15 reps;Supine Goniometric ROM: AAROm R knee -8- 65    General Comments        Pertinent Vitals/Pain Pain Assessment: 0-10 Pain Score: 4  Pain Location: R knee Pain Descriptors / Indicators: Aching;Sore Pain Intervention(s): Limited activity within patient's tolerance;Monitored during session;Premedicated before session;Ice applied    Home Living                      Prior Function            PT Goals (current goals can now be found in the care plan section) Acute Rehab PT Goals Patient Stated Goal: Regain IND PT Goal Formulation: With patient Time For Goal Achievement: 09/29/18 Potential to Achieve Goals: Good Progress towards PT goals: Progressing toward goals  Frequency    7X/week      PT Plan Current plan remains appropriate    Co-evaluation              AM-PAC PT "6 Clicks" Mobility   Outcome Measure  Help needed turning from your back to your side while in a flat bed without using bedrails?: A Little Help needed moving from lying on your back to sitting on the side of a flat bed without using bedrails?: A Little Help needed moving to and from a bed to a chair (including a wheelchair)?: A Little Help needed standing up from a  chair using your arms (e.g., wheelchair or bedside chair)?: A Little Help needed to walk in hospital room?: A Little Help needed climbing 3-5 steps with a railing? : A Lot 6 Click Score: 17    End of Session Equipment Utilized During Treatment: Gait belt Activity Tolerance: Patient tolerated treatment well Patient left: in chair;with call bell/phone within reach;with chair alarm set;with nursing/sitter in room Nurse Communication: Mobility status PT Visit Diagnosis: Difficulty in walking, not elsewhere classified (R26.2)     Time: 3646-8032 PT Time Calculation (min) (ACUTE ONLY): 39 min  Charges:  $Gait Training: 23-37 mins $Therapeutic Exercise: 8-22 mins                     [place] Pager [phone] Office [phone]    Jesica Goheen 09/23/2018, 11:01 AM

## 2018-09-23 NOTE — Progress Notes (Signed)
Inpatient Diabetes Program Recommendations  AACE/ADA: New Consensus Statement on Inpatient Glycemic Control (2015)  Target Ranges:  Prepandial:   less than 140 mg/dL      Peak postprandial:   less than 180 mg/dL (1-2 hours)      Critically ill patients:  140 - 180 mg/dL   Lab Results  Component Value Date   GLUCAP 240 (H) 09/23/2018   HGBA1C 6.2 (H) 09/22/2018    Review of Glycemic Control Results for Brittany Russo, Brittany Russo (MRN [id number]) as of 09/23/2018 07:28  Ref. Range 09/22/2018 16:17 09/22/2018 21:07 09/23/2018 07:18  Glucose-Capillary Latest Ref Range: 70 - 99 mg/dL 340 (H) 303 (H) 240 (H)   Diabetes history: DM 2 Outpatient Diabetes medications:  Glipizide 5 mg bid, Lantus 65 units daily, Janumet 50-500 mg bid Current orders for Inpatient glycemic control:  Novolog moderate tid with meals, Glucotrol 5 mg bid, Tradjenta 5 mg and Metformin 500 mg bid Lantus 65 units daily  Inpatient Diabetes Program Recommendations:    Consider d/c of Glucotrol while patient is in the hospital.  Consider adding Novolog meal coverage 4 units tid with meals (note patient did receive Decadron 8 mg on 09/22/18 which likely increased blood sugars). A1C indicates good control of DM.   Thanks,  Adah Perl, RN, BC-ADM Inpatient Diabetes Coordinator Pager [phone] (8a-5p)

## 2018-09-23 NOTE — Progress Notes (Signed)
Physical Therapy Treatment Patient Details Name: Brittany Russo MRN: [id number] DOB: [birth year] Today's Date: 09/23/2018    History of Present Illness Pt s/p R TKR and with hx of DM, SZ, endometrial CA, and cirrhosis of the liver    PT Comments    Pt continues cooperative but ltd by fatigue and notably unsteady with initial standing - Rn aware.  Pt ambulated short distance in halls and then to bathroom and performed home therex program with written instruction provided and reviewed.  Follow Up Recommendations  Follow surgeon's recommendation for DC plan and follow-up therapies     Equipment Recommendations  Rolling walker with 5" wheels;3in1 (PT)(Youth level)    Recommendations for Other Services       Precautions / Restrictions Precautions Precautions: Fall Restrictions Weight Bearing Restrictions: No Other Position/Activity Restrictions: WBAT    Mobility  Bed Mobility               General bed mobility comments: Pt up in chair  Transfers Overall transfer level: Needs assistance Equipment used: Rolling walker (2 wheeled) Transfers: Sit to/from Stand Sit to Stand: Min assist         General transfer comment: cues for LE management and use of UEs to self assist; physical assist to bring wt and fwd and to balance in initial standing on first two attempts but min guard only on third attempt - pt states "I think its the medicine"  Ambulation/Gait Ambulation/Gait assistance: Min assist;Min guard Gait Distance (Feet): 60 Feet(and 20' into bathroom) Assistive device: Rolling walker (2 wheeled) Gait Pattern/deviations: Step-to pattern;Shuffle;Trunk flexed;Decreased step length - right;Decreased step length - left Gait velocity: decr   General Gait Details: cues for sequence, posture and position from [employer]             Wheelchair Mobility    Modified Rankin (Stroke Patients Only)       Balance Overall balance assessment: Needs  assistance Sitting-balance support: No upper extremity supported;Feet supported Sitting balance-Leahy Scale: Good     Standing balance support: Bilateral upper extremity supported Standing balance-Leahy Scale: Poor Standing balance comment: balance loss with initial standing and reliant on UEs throughout session for stability                            Cognition Arousal/Alertness: Awake/alert Behavior During Therapy: WFL for tasks assessed/performed Overall Cognitive Status: Within Functional Limits for tasks assessed                                        Exercises Total Joint Exercises Ankle Circles/Pumps: AROM;Both;15 reps;Supine Quad Sets: AROM;Both;10 reps;Supine Heel Slides: AAROM;Right;15 reps;Supine Straight Leg Raises: AAROM;AROM;Right;15 reps;Supine Long Arc Quad: AROM;Right;10 reps;Seated    General Comments        Pertinent Vitals/Pain Pain Assessment: 0-10 Pain Score: 3  Pain Location: R knee Pain Descriptors / Indicators: Aching;Sore Pain Intervention(s): Limited activity within patient's tolerance;Premedicated before session;Monitored during session    Home Living                      Prior Function            PT Goals (current goals can now be found in the care plan section) Acute Rehab PT Goals Patient Stated Goal: Regain IND PT Goal Formulation: With patient Time For Goal Achievement: 09/29/18  Potential to Achieve Goals: Good Progress towards PT goals: Progressing toward goals    Frequency    7X/week      PT Plan Current plan remains appropriate    Co-evaluation              AM-PAC PT "6 Clicks" Mobility   Outcome Measure  Help needed turning from your back to your side while in a flat bed without using bedrails?: A Little Help needed moving from lying on your back to sitting on the side of a flat bed without using bedrails?: A Little Help needed moving to and from a bed to a chair  (including a wheelchair)?: A Little Help needed standing up from a chair using your arms (e.g., wheelchair or bedside chair)?: A Little Help needed to walk in hospital room?: A Little Help needed climbing 3-5 steps with a railing? : A Lot 6 Click Score: 17    End of Session Equipment Utilized During Treatment: Gait belt Activity Tolerance: Patient tolerated treatment well Patient left: Other (comment)(in bathroom with instructions on call bell - CNA aware) Nurse Communication: Mobility status PT Visit Diagnosis: Difficulty in walking, not elsewhere classified (R26.2)     Time: 0340-3524 PT Time Calculation (min) (ACUTE ONLY): 32 min  Charges:  $Gait Training: 8-22 mins $Therapeutic Exercise: 8-22 mins                     [place] Pager [phone] Office [phone]    Zayne Marovich 09/23/2018, 4:12 PM

## 2018-09-23 NOTE — Progress Notes (Signed)
Patient ID: Brittany Russo, female   DOB: [birth year], 73 y.o.   MRN: [id number] Subjective: 1 Day Post-Op Procedure(s) (LRB): TOTAL KNEE ARTHROPLASTY (Right)    Patient reports pain as mild to moderate.  No events.  Does not that her glucose has been high here than at home.  Also notes the blood in her urine and reviewed the onset of this associated with radiation treatment and bladder tear  Objective:   VITALS:   Vitals:   09/23/18 0049 09/23/18 0510  BP: (!) 174/79 (!) 154/81  Pulse: 72 73  Resp: 16 16  Temp: 97.7 F (36.5 C) 98.5 F (36.9 C)  SpO2: 97% 97%    Neurovascular intact Incision: dressing C/D/I  LABS Recent Labs    09/23/18 0259  HGB 9.0*  HCT 27.8*  WBC 5.1  PLT 58*    Recent Labs    09/23/18 0259  NA 135  K 4.1  BUN 15  CREATININE 0.70  GLUCOSE 273*    No results for input(s): LABPT, INR in the last 72 hours.   Assessment/Plan: 1 Day Post-Op Procedure(s) (LRB): TOTAL KNEE ARTHROPLASTY (Right)   Advance diet Up with therapy  Plan for home discharge today after therapy Will discharge on PO antibiotics as she has used this for UTIs and hematuria in recent past as well as being higher risk patient due to adipose tissue over her knee and her underlying diabetes RTC in 2 weeks Therapy already scheduled as outpatient

## 2018-09-24 ENCOUNTER — Other Ambulatory Visit: Payer: Self-pay

## 2018-09-24 ENCOUNTER — Encounter (HOSPITAL_COMMUNITY): Payer: Self-pay

## 2018-09-24 ENCOUNTER — Emergency Department (HOSPITAL_COMMUNITY): Payer: Medicare Other

## 2018-09-24 ENCOUNTER — Inpatient Hospital Stay (HOSPITAL_COMMUNITY)
Admission: EM | Admit: 2018-09-24 | Discharge: 2018-09-30 | DRG: 957 | Disposition: A | Discharge status: Skilled Nursing Facility | Payer: Medicare Other | Attending: Orthopedic Surgery | Admitting: Orthopedic Surgery

## 2018-09-24 ENCOUNTER — Inpatient Hospital Stay (HOSPITAL_COMMUNITY): Payer: Medicare Other

## 2018-09-24 DIAGNOSIS — Z1159 Encounter for screening for other viral diseases: Secondary | ICD-10-CM | POA: Diagnosis not present

## 2018-09-24 DIAGNOSIS — M1990 Unspecified osteoarthritis, unspecified site: Secondary | ICD-10-CM | POA: Diagnosis present

## 2018-09-24 DIAGNOSIS — Z923 Personal history of irradiation: Secondary | ICD-10-CM | POA: Diagnosis not present

## 2018-09-24 DIAGNOSIS — Z809 Family history of malignant neoplasm, unspecified: Secondary | ICD-10-CM

## 2018-09-24 DIAGNOSIS — E871 Hypo-osmolality and hyponatremia: Secondary | ICD-10-CM | POA: Diagnosis not present

## 2018-09-24 DIAGNOSIS — Y92013 Bedroom of single-family (private) house as the place of occurrence of the external cause: Secondary | ICD-10-CM

## 2018-09-24 DIAGNOSIS — Z6838 Body mass index (BMI) 38.0-38.9, adult: Secondary | ICD-10-CM

## 2018-09-24 DIAGNOSIS — Z8249 Family history of ischemic heart disease and other diseases of the circulatory system: Secondary | ICD-10-CM | POA: Diagnosis not present

## 2018-09-24 DIAGNOSIS — I10 Essential (primary) hypertension: Secondary | ICD-10-CM | POA: Diagnosis not present

## 2018-09-24 DIAGNOSIS — Z833 Family history of diabetes mellitus: Secondary | ICD-10-CM | POA: Diagnosis not present

## 2018-09-24 DIAGNOSIS — E119 Type 2 diabetes mellitus without complications: Secondary | ICD-10-CM | POA: Diagnosis present

## 2018-09-24 DIAGNOSIS — E785 Hyperlipidemia, unspecified: Secondary | ICD-10-CM | POA: Diagnosis not present

## 2018-09-24 DIAGNOSIS — M25552 Pain in left hip: Secondary | ICD-10-CM

## 2018-09-24 DIAGNOSIS — Z886 Allergy status to analgesic agent status: Secondary | ICD-10-CM

## 2018-09-24 DIAGNOSIS — Z7982 Long term (current) use of aspirin: Secondary | ICD-10-CM | POA: Diagnosis not present

## 2018-09-24 DIAGNOSIS — Z7984 Long term (current) use of oral hypoglycemic drugs: Secondary | ICD-10-CM

## 2018-09-24 DIAGNOSIS — R41841 Cognitive communication deficit: Secondary | ICD-10-CM | POA: Diagnosis not present

## 2018-09-24 DIAGNOSIS — R188 Other ascites: Secondary | ICD-10-CM | POA: Diagnosis present

## 2018-09-24 DIAGNOSIS — F418 Other specified anxiety disorders: Secondary | ICD-10-CM | POA: Diagnosis not present

## 2018-09-24 DIAGNOSIS — Y842 Radiological procedure and radiotherapy as the cause of abnormal reaction of the patient, or of later complication, without mention of misadventure at the time of the procedure: Secondary | ICD-10-CM | POA: Diagnosis present

## 2018-09-24 DIAGNOSIS — K7200 Acute and subacute hepatic failure without coma: Secondary | ICD-10-CM | POA: Diagnosis present

## 2018-09-24 DIAGNOSIS — G8918 Other acute postprocedural pain: Secondary | ICD-10-CM | POA: Diagnosis not present

## 2018-09-24 DIAGNOSIS — E1165 Type 2 diabetes mellitus with hyperglycemia: Secondary | ICD-10-CM | POA: Diagnosis not present

## 2018-09-24 DIAGNOSIS — S72491D Other fracture of lower end of right femur, subsequent encounter for closed fracture with routine healing: Secondary | ICD-10-CM | POA: Diagnosis not present

## 2018-09-24 DIAGNOSIS — M6281 Muscle weakness (generalized): Secondary | ICD-10-CM | POA: Diagnosis not present

## 2018-09-24 DIAGNOSIS — R400 Somnolence: Secondary | ICD-10-CM | POA: Diagnosis not present

## 2018-09-24 DIAGNOSIS — F339 Major depressive disorder, recurrent, unspecified: Secondary | ICD-10-CM | POA: Diagnosis not present

## 2018-09-24 DIAGNOSIS — M9711XA Periprosthetic fracture around internal prosthetic right knee joint, initial encounter: Secondary | ICD-10-CM

## 2018-09-24 DIAGNOSIS — Z8542 Personal history of malignant neoplasm of other parts of uterus: Secondary | ICD-10-CM | POA: Diagnosis not present

## 2018-09-24 DIAGNOSIS — F329 Major depressive disorder, single episode, unspecified: Secondary | ICD-10-CM | POA: Diagnosis present

## 2018-09-24 DIAGNOSIS — K7682 Hepatic encephalopathy: Secondary | ICD-10-CM | POA: Diagnosis present

## 2018-09-24 DIAGNOSIS — S3723XA Laceration of bladder, initial encounter: Secondary | ICD-10-CM | POA: Diagnosis present

## 2018-09-24 DIAGNOSIS — F419 Anxiety disorder, unspecified: Secondary | ICD-10-CM | POA: Diagnosis present

## 2018-09-24 DIAGNOSIS — Z209 Contact with and (suspected) exposure to unspecified communicable disease: Secondary | ICD-10-CM | POA: Diagnosis not present

## 2018-09-24 DIAGNOSIS — W1830XA Fall on same level, unspecified, initial encounter: Secondary | ICD-10-CM | POA: Diagnosis present

## 2018-09-24 DIAGNOSIS — S72401A Unspecified fracture of lower end of right femur, initial encounter for closed fracture: Secondary | ICD-10-CM | POA: Diagnosis not present

## 2018-09-24 DIAGNOSIS — K7581 Nonalcoholic steatohepatitis (NASH): Secondary | ICD-10-CM | POA: Diagnosis not present

## 2018-09-24 DIAGNOSIS — Z4789 Encounter for other orthopedic aftercare: Secondary | ICD-10-CM | POA: Diagnosis not present

## 2018-09-24 DIAGNOSIS — Z539 Procedure and treatment not carried out, unspecified reason: Secondary | ICD-10-CM | POA: Diagnosis present

## 2018-09-24 DIAGNOSIS — F32A Depression, unspecified: Secondary | ICD-10-CM | POA: Diagnosis present

## 2018-09-24 DIAGNOSIS — Z9119 Patient's noncompliance with other medical treatment and regimen: Secondary | ICD-10-CM

## 2018-09-24 DIAGNOSIS — K7030 Alcoholic cirrhosis of liver without ascites: Secondary | ICD-10-CM | POA: Diagnosis not present

## 2018-09-24 DIAGNOSIS — E875 Hyperkalemia: Secondary | ICD-10-CM | POA: Diagnosis not present

## 2018-09-24 DIAGNOSIS — Z7951 Long term (current) use of inhaled steroids: Secondary | ICD-10-CM | POA: Diagnosis not present

## 2018-09-24 DIAGNOSIS — Z794 Long term (current) use of insulin: Secondary | ICD-10-CM | POA: Diagnosis not present

## 2018-09-24 DIAGNOSIS — Z7401 Bed confinement status: Secondary | ICD-10-CM | POA: Diagnosis not present

## 2018-09-24 DIAGNOSIS — M9701XA Periprosthetic fracture around internal prosthetic right hip joint, initial encounter: Secondary | ICD-10-CM | POA: Diagnosis not present

## 2018-09-24 DIAGNOSIS — M1711 Unilateral primary osteoarthritis, right knee: Secondary | ICD-10-CM | POA: Diagnosis present

## 2018-09-24 DIAGNOSIS — M25561 Pain in right knee: Secondary | ICD-10-CM | POA: Diagnosis not present

## 2018-09-24 DIAGNOSIS — R0689 Other abnormalities of breathing: Secondary | ICD-10-CM | POA: Diagnosis not present

## 2018-09-24 DIAGNOSIS — R29818 Other symptoms and signs involving the nervous system: Secondary | ICD-10-CM | POA: Diagnosis not present

## 2018-09-24 DIAGNOSIS — K7460 Unspecified cirrhosis of liver: Secondary | ICD-10-CM

## 2018-09-24 DIAGNOSIS — R4182 Altered mental status, unspecified: Secondary | ICD-10-CM | POA: Diagnosis not present

## 2018-09-24 DIAGNOSIS — T84012D Broken internal right knee prosthesis, subsequent encounter: Secondary | ICD-10-CM | POA: Diagnosis not present

## 2018-09-24 DIAGNOSIS — S79929A Unspecified injury of unspecified thigh, initial encounter: Secondary | ICD-10-CM | POA: Diagnosis not present

## 2018-09-24 DIAGNOSIS — T84022A Instability of internal right knee prosthesis, initial encounter: Secondary | ICD-10-CM | POA: Diagnosis not present

## 2018-09-24 DIAGNOSIS — D649 Anemia, unspecified: Secondary | ICD-10-CM | POA: Diagnosis present

## 2018-09-24 DIAGNOSIS — E878 Other disorders of electrolyte and fluid balance, not elsewhere classified: Secondary | ICD-10-CM | POA: Diagnosis not present

## 2018-09-24 DIAGNOSIS — M2550 Pain in unspecified joint: Secondary | ICD-10-CM | POA: Diagnosis not present

## 2018-09-24 DIAGNOSIS — E669 Obesity, unspecified: Secondary | ICD-10-CM | POA: Diagnosis present

## 2018-09-24 DIAGNOSIS — I959 Hypotension, unspecified: Secondary | ICD-10-CM | POA: Diagnosis not present

## 2018-09-24 DIAGNOSIS — Z9181 History of falling: Secondary | ICD-10-CM | POA: Diagnosis not present

## 2018-09-24 DIAGNOSIS — D6959 Other secondary thrombocytopenia: Secondary | ICD-10-CM | POA: Diagnosis present

## 2018-09-24 DIAGNOSIS — Z96651 Presence of right artificial knee joint: Secondary | ICD-10-CM | POA: Diagnosis present

## 2018-09-24 DIAGNOSIS — R319 Hematuria, unspecified: Secondary | ICD-10-CM | POA: Diagnosis present

## 2018-09-24 DIAGNOSIS — Z8544 Personal history of malignant neoplasm of other female genital organs: Secondary | ICD-10-CM

## 2018-09-24 DIAGNOSIS — R52 Pain, unspecified: Secondary | ICD-10-CM | POA: Diagnosis not present

## 2018-09-24 DIAGNOSIS — R0902 Hypoxemia: Secondary | ICD-10-CM | POA: Diagnosis not present

## 2018-09-24 LAB — CBC WITH DIFFERENTIAL/PLATELET
Abs Immature Granulocytes: 0.14 10*3/uL — ABNORMAL HIGH (ref 0.00–0.07)
Basophils Absolute: 0 10*3/uL (ref 0.0–0.1)
Basophils Relative: 0 %
Eosinophils Absolute: 0 10*3/uL (ref 0.0–0.5)
Eosinophils Relative: 0 %
HCT: 26.4 % — ABNORMAL LOW (ref 36.0–46.0)
Hemoglobin: 8.6 g/dL — ABNORMAL LOW (ref 12.0–15.0)
Immature Granulocytes: 1 %
Lymphocytes Relative: 11 %
Lymphs Abs: 1.4 10*3/uL (ref 0.7–4.0)
MCH: 28.9 pg (ref 26.0–34.0)
MCHC: 32.6 g/dL (ref 30.0–36.0)
MCV: 88.6 fL (ref 80.0–100.0)
Monocytes Absolute: 1.3 10*3/uL — ABNORMAL HIGH (ref 0.1–1.0)
Monocytes Relative: 10 %
Neutro Abs: 10.4 10*3/uL — ABNORMAL HIGH (ref 1.7–7.7)
Neutrophils Relative %: 78 %
Platelets: 120 10*3/uL — ABNORMAL LOW (ref 150–400)
RBC: 2.98 MIL/uL — ABNORMAL LOW (ref 3.87–5.11)
RDW: 13.2 % (ref 11.5–15.5)
WBC: 13.4 10*3/uL — ABNORMAL HIGH (ref 4.0–10.5)
nRBC: 0 % (ref 0.0–0.2)

## 2018-09-24 LAB — GLUCOSE, CAPILLARY
Glucose-Capillary: 320 mg/dL — ABNORMAL HIGH (ref 70–99)
Glucose-Capillary: 370 mg/dL — ABNORMAL HIGH (ref 70–99)

## 2018-09-24 LAB — BASIC METABOLIC PANEL
Anion gap: 9 (ref 5–15)
BUN: 24 mg/dL — ABNORMAL HIGH (ref 8–23)
CO2: 22 mmol/L (ref 22–32)
Calcium: 8.7 mg/dL — ABNORMAL LOW (ref 8.9–10.3)
Chloride: 98 mmol/L (ref 98–111)
Creatinine, Ser: 0.83 mg/dL (ref 0.44–1.00)
GFR calc Af Amer: >60 mL/min (ref >60)
GFR calc non Af Amer: >60 mL/min (ref >60)
Glucose, Bld: 267 mg/dL — ABNORMAL HIGH (ref 70–99)
Potassium: 4.1 mmol/L (ref 3.5–5.1)
Sodium: 129 mmol/L — ABNORMAL LOW (ref 135–145)

## 2018-09-24 LAB — SARS CORONAVIRUS 2 BY RT PCR (HOSPITAL ORDER, PERFORMED IN ~~LOC~~ HOSPITAL LAB): SARS Coronavirus 2: NEGATIVE

## 2018-09-24 MED ORDER — ONDANSETRON HCL 4 MG/2ML IJ SOLN: 4.0000 mg | Freq: Four times a day (QID) | INTRAMUSCULAR | Status: DC | PRN

## 2018-09-24 MED ORDER — MONTELUKAST SODIUM 10 MG PO TABS
10.0000 mg | ORAL_TABLET | Freq: Every day | ORAL | Status: DC
  Administered 2018-09-24 – 2018-09-29 (×6): 10 mg via ORAL
  Filled 2018-09-24 (×6): qty 1

## 2018-09-24 MED ORDER — BASAGLAR KWIKPEN 100 UNIT/ML ~~LOC~~ SOPN: 65.0000 [IU] | PEN_INJECTOR | Freq: Every day | SUBCUTANEOUS | Status: DC

## 2018-09-24 MED ORDER — HYDROCODONE-ACETAMINOPHEN 7.5-325 MG PO TABS: 1.0000 | ORAL_TABLET | ORAL | Status: DC | PRN

## 2018-09-24 MED ORDER — METFORMIN HCL 500 MG PO TABS
500.0000 mg | ORAL_TABLET | Freq: Two times a day (BID) | ORAL | Status: DC
  Administered 2018-09-24 – 2018-09-25 (×3): 500 mg via ORAL
  Filled 2018-09-24 (×3): qty 1

## 2018-09-24 MED ORDER — ALPRAZOLAM 0.25 MG PO TABS
0.2500 mg | ORAL_TABLET | Freq: Two times a day (BID) | ORAL | Status: DC
  Administered 2018-09-24 – 2018-09-30 (×9): 0.25 mg via ORAL
  Filled 2018-09-24 (×9): qty 1

## 2018-09-24 MED ORDER — GLIPIZIDE 5 MG PO TABS
5.0000 mg | ORAL_TABLET | Freq: Two times a day (BID) | ORAL | Status: DC
  Administered 2018-09-24 – 2018-09-25 (×3): 5 mg via ORAL
  Filled 2018-09-24 (×3): qty 1

## 2018-09-24 MED ORDER — DILTIAZEM HCL ER COATED BEADS 180 MG PO CP24
180.0000 mg | ORAL_CAPSULE | Freq: Every day | ORAL | Status: DC
  Administered 2018-09-24 – 2018-09-30 (×6): 180 mg via ORAL
  Filled 2018-09-24 (×6): qty 1

## 2018-09-24 MED ORDER — MUPIROCIN CALCIUM 2 % NA OINT: 1.0000 "application " | TOPICAL_OINTMENT | Freq: Two times a day (BID) | NASAL | Status: DC

## 2018-09-24 MED ORDER — HYDROCHLOROTHIAZIDE 12.5 MG PO CAPS
12.5000 mg | ORAL_CAPSULE | Freq: Every day | ORAL | Status: DC
  Administered 2018-09-24 – 2018-09-25 (×2): 12.5 mg via ORAL
  Filled 2018-09-24: qty 1

## 2018-09-24 MED ORDER — HYDROCHLOROTHIAZIDE 12.5 MG PO CAPS
12.5000 mg | ORAL_CAPSULE | Freq: Every day | ORAL | Status: DC
  Administered 2018-09-25: 12.5 mg via ORAL
  Filled 2018-09-24 (×2): qty 1

## 2018-09-24 MED ORDER — ACETAMINOPHEN 325 MG PO TABS
325.0000 mg | ORAL_TABLET | Freq: Four times a day (QID) | ORAL | Status: DC | PRN
  Administered 2018-09-25: 650 mg via ORAL
  Administered 2018-09-26: 325 mg via ORAL
  Administered 2018-09-26: 650 mg via ORAL
  Administered 2018-09-27: 325 mg via ORAL
  Administered 2018-09-29 – 2018-09-30 (×2): 650 mg via ORAL
  Filled 2018-09-24: qty 2
  Filled 2018-09-24 (×2): qty 1
  Filled 2018-09-24 (×3): qty 2

## 2018-09-24 MED ORDER — INSULIN GLARGINE 100 UNIT/ML ~~LOC~~ SOLN
65.0000 [IU] | Freq: Every day | SUBCUTANEOUS | Status: DC
  Administered 2018-09-24 – 2018-09-30 (×7): 65 [IU] via SUBCUTANEOUS
  Filled 2018-09-24 (×8): qty 0.65

## 2018-09-24 MED ORDER — METHOCARBAMOL 1000 MG/10ML IJ SOLN
500.0000 mg | Freq: Four times a day (QID) | INTRAVENOUS | Status: DC | PRN
  Filled 2018-09-24: qty 5

## 2018-09-24 MED ORDER — LORATADINE 10 MG PO TABS
10.0000 mg | ORAL_TABLET | Freq: Every day | ORAL | Status: DC
  Administered 2018-09-24 – 2018-09-30 (×4): 10 mg via ORAL
  Filled 2018-09-24 (×4): qty 1

## 2018-09-24 MED ORDER — INSULIN ASPART 100 UNIT/ML ~~LOC~~ SOLN
0.0000 [IU] | Freq: Every day | SUBCUTANEOUS | Status: DC
  Administered 2018-09-24: 5 [IU] via SUBCUTANEOUS

## 2018-09-24 MED ORDER — CEFUROXIME AXETIL 250 MG PO TABS
250.0000 mg | ORAL_TABLET | Freq: Two times a day (BID) | ORAL | Status: DC
  Administered 2018-09-24 – 2018-09-30 (×10): 250 mg via ORAL
  Filled 2018-09-24 (×14): qty 1

## 2018-09-24 MED ORDER — MORPHINE SULFATE (PF) 2 MG/ML IV SOLN
0.5000 mg | INTRAVENOUS | Status: DC | PRN
  Administered 2018-09-24: 1 mg via INTRAVENOUS
  Filled 2018-09-24: qty 1

## 2018-09-24 MED ORDER — METHOCARBAMOL 500 MG PO TABS
500.0000 mg | ORAL_TABLET | Freq: Four times a day (QID) | ORAL | Status: DC | PRN
  Administered 2018-09-26 – 2018-09-30 (×9): 500 mg via ORAL
  Filled 2018-09-24 (×9): qty 1

## 2018-09-24 MED ORDER — HYDROCODONE-ACETAMINOPHEN 5-325 MG PO TABS
1.0000 | ORAL_TABLET | ORAL | Status: DC | PRN
  Administered 2018-09-24 – 2018-09-25 (×2): 1 via ORAL
  Administered 2018-09-25: 2 via ORAL
  Administered 2018-09-25: 1 via ORAL
  Filled 2018-09-24: qty 2
  Filled 2018-09-24 (×3): qty 1

## 2018-09-24 MED ORDER — ATENOLOL 50 MG PO TABS
50.0000 mg | ORAL_TABLET | Freq: Every day | ORAL | Status: DC
  Administered 2018-09-24 – 2018-09-30 (×6): 50 mg via ORAL
  Filled 2018-09-24 (×6): qty 1

## 2018-09-24 MED ORDER — LINAGLIPTIN 5 MG PO TABS
5.0000 mg | ORAL_TABLET | Freq: Every day | ORAL | Status: DC
  Administered 2018-09-24 – 2018-09-25 (×2): 5 mg via ORAL
  Filled 2018-09-24 (×2): qty 1

## 2018-09-24 MED ORDER — IRBESARTAN 150 MG PO TABS
150.0000 mg | ORAL_TABLET | Freq: Every day | ORAL | Status: DC
  Administered 2018-09-24 – 2018-09-26 (×3): 150 mg via ORAL
  Filled 2018-09-24 (×3): qty 1

## 2018-09-24 MED ORDER — SIMVASTATIN 40 MG PO TABS
40.0000 mg | ORAL_TABLET | Freq: Every day | ORAL | Status: DC
  Administered 2018-09-24 – 2018-09-29 (×6): 40 mg via ORAL
  Filled 2018-09-24 (×7): qty 1

## 2018-09-24 MED ORDER — SITAGLIPTIN PHOS-METFORMIN HCL 50-500 MG PO TABS: 1.0000 | ORAL_TABLET | Freq: Two times a day (BID) | ORAL | Status: DC

## 2018-09-24 MED ORDER — ONDANSETRON HCL 4 MG PO TABS: 4.0000 mg | ORAL_TABLET | Freq: Four times a day (QID) | ORAL | Status: DC | PRN

## 2018-09-24 MED ORDER — LACTULOSE 10 GM/15ML PO SOLN: 10.0000 g | Freq: Two times a day (BID) | ORAL | Status: DC | PRN

## 2018-09-24 MED ORDER — FLUTICASONE PROPIONATE 50 MCG/ACT NA SUSP
1.0000 | Freq: Every day | NASAL | Status: DC
  Administered 2018-09-24 – 2018-09-30 (×6): 1 via NASAL
  Filled 2018-09-24: qty 16

## 2018-09-24 MED ORDER — INSULIN ASPART 100 UNIT/ML ~~LOC~~ SOLN
0.0000 [IU] | Freq: Three times a day (TID) | SUBCUTANEOUS | Status: DC
  Administered 2018-09-25 (×3): 3 [IU] via SUBCUTANEOUS
  Administered 2018-09-26: 5 [IU] via SUBCUTANEOUS
  Administered 2018-09-26: 3 [IU] via SUBCUTANEOUS
  Administered 2018-09-26: 5 [IU] via SUBCUTANEOUS
  Administered 2018-09-27: 2 [IU] via SUBCUTANEOUS
  Administered 2018-09-28: 11 [IU] via SUBCUTANEOUS
  Administered 2018-09-28: 3 [IU] via SUBCUTANEOUS
  Administered 2018-09-28: 8 [IU] via SUBCUTANEOUS
  Administered 2018-09-29: 3 [IU] via SUBCUTANEOUS
  Administered 2018-09-29 – 2018-09-30 (×2): 5 [IU] via SUBCUTANEOUS
  Administered 2018-09-30: 2 [IU] via SUBCUTANEOUS

## 2018-09-24 MED ORDER — CITALOPRAM HYDROBROMIDE 20 MG PO TABS
40.0000 mg | ORAL_TABLET | Freq: Every day | ORAL | Status: DC
  Administered 2018-09-24 – 2018-09-30 (×6): 40 mg via ORAL
  Filled 2018-09-24 (×6): qty 2

## 2018-09-24 MED ORDER — ONDANSETRON HCL 4 MG/2ML IJ SOLN
4.0000 mg | Freq: Once | INTRAMUSCULAR | Status: AC
  Administered 2018-09-24: 4 mg via INTRAVENOUS
  Filled 2018-09-24: qty 2

## 2018-09-24 MED ORDER — MORPHINE SULFATE (PF) 4 MG/ML IV SOLN
4.0000 mg | Freq: Once | INTRAVENOUS | Status: AC
  Administered 2018-09-24: 4 mg via INTRAVENOUS
  Filled 2018-09-24: qty 1

## 2018-09-24 MED ORDER — POTASSIUM CHLORIDE CRYS ER 10 MEQ PO TBCR
10.0000 meq | EXTENDED_RELEASE_TABLET | Freq: Every day | ORAL | Status: DC
  Administered 2018-09-24 – 2018-09-29 (×5): 10 meq via ORAL
  Filled 2018-09-24 (×5): qty 1

## 2018-09-24 MED ORDER — FERROUS SULFATE 325 (65 FE) MG PO TABS
325.0000 mg | ORAL_TABLET | Freq: Two times a day (BID) | ORAL | Status: DC
  Administered 2018-09-24 – 2018-09-30 (×9): 325 mg via ORAL
  Filled 2018-09-24 (×10): qty 1

## 2018-09-24 MED ORDER — CANDESARTAN CILEXETIL-HCTZ 16-12.5 MG PO TABS: 1.0000 | ORAL_TABLET | Freq: Every day | ORAL | Status: DC

## 2018-09-24 MED ORDER — NAPROXEN 250 MG PO TABS
250.0000 mg | ORAL_TABLET | Freq: Two times a day (BID) | ORAL | Status: DC
  Administered 2018-09-24 – 2018-09-26 (×4): 250 mg via ORAL
  Filled 2018-09-24 (×10): qty 1

## 2018-09-24 NOTE — ED Notes (Signed)
Bed: BC48 Expected date:  Expected time:  Means of arrival:  Comments: EMS 73 yo female from home-discharged yesterday after total knee-fall tonight and injured knee

## 2018-09-24 NOTE — ED Triage Notes (Signed)
Per EMS, patient coming from home with complaints of right knee pain starting yesterday. Patient had a total right knee placement and was discharged from the hospital yesterday. Patient states that she twisted her knee when getting into the car at discharge and fell twice tonight while trying to transfer from bed to bedside commode.    Patient had hydrocodone at 2230.

## 2018-09-24 NOTE — ED Notes (Signed)
ED TO INPATIENT HANDOFF REPORT  Name/Age/Gender Brittany Russo 73 y.o. female  Code Status    Code Status Orders  (From admission, onward)         Start     Ordered   09/24/18 0332  Full code  Continuous     09/24/18 0334        Code Status History    Date Active Date Inactive Code Status Order ID Comments User Context   09/22/2018 1046 09/23/2018 2001 Full Code [id number]  Maurice March PA-C Inpatient   Advance Care Planning Activity      Home/SNF/Other Home  Chief Complaint Knee Pain  Level of Care/Admitting Diagnosis ED Disposition    ED Disposition Condition [place]: [facility] [[id number]]  Level of Care: Med-Surg [16]  Covid Evaluation: Confirmed COVID Negative  Diagnosis: Periprosthetic fracture around internal prosthetic right knee joint, initial encounter [[id number]]  Admitting Physician: Nicholes Stairs [[id number]]  Attending Physician: Nicholes Stairs [[id number]]  Estimated length of stay: 3 - 4 days  Certification:: I certify this patient will need inpatient services for at least 2 midnights  PT Class (Do Not Modify): Inpatient [101]  PT Acc Code (Do Not Modify): Private [1]       Medical History Past Medical History:  Diagnosis Date  . Allergy   . Anxiety   . Arthritis   . Cirrhosis of liver (HCC)    nonalcoholic per patient , reports she takes a medication that makes her have bowel movements  to treat her liver   . Depression   . Diabetes mellitus without complication ([place])    type 2  . Eczema of both hands   . Endometrial cancer ([place]) 2005   treated with radiation therapy    . Headache   . Hyperlipidemia   . Hypertension   . Seizures ([place])    last seizure June 07, 2014   . Thyroid nodule   . Vaginal cancer (HCC)     Allergies Allergies  Allergen Reactions  . Asa [Aspirin]     Pt not allergic but cannot take due to bleeding   . Other     Any jewelry metal-hives and itching      IV Location/Drains/Wounds Patient Lines/Drains/Airways Status   Active Line/Drains/Airways    Name:   Placement date:   Placement time:   Site:   Days:   Peripheral IV 09/24/18 Right Hand   09/24/18    0250    Hand   less than 1   External Urinary Catheter   09/24/18    0119    -   less than 1   Incision (Closed) 06/04/17 Eye Left   06/04/17    0840     477   Incision (Closed) 07/15/17 Eye   07/15/17    0811     436   Incision (Closed) 09/22/18 Knee Right   09/22/18    0815     2          Labs/Imaging Results for orders placed or performed during the hospital encounter of 09/22/18 (from the past 48 hour(s))  Hemoglobin A1c     Status: Abnormal   Collection Time: 09/22/18  5:40 AM  Result Value Ref Range   Hgb A1c MFr Bld 6.2 (H) 4.8 - 5.6 %    Comment: (NOTE) Pre diabetes:          5.7%-6.4% Diabetes:              >  6.4% Glycemic control for   <7.0% adults with diabetes    Mean Plasma Glucose 131.24 mg/dL    Comment: Performed at [facility] [address], [place], [place] [address]  Glucose, capillary     Status: Abnormal   Collection Time: 09/22/18  5:59 AM  Result Value Ref Range   Glucose-Capillary 160 (H) 70 - 99 mg/dL  Urine Culture     Status: Abnormal   Collection Time: 09/22/18  7:18 AM   Specimen: Urine, Clean Catch  Result Value Ref Range   Specimen Description      URINE, CLEAN CATCH Performed at [facility], [place] [address] [place], [place] [address]    Special Requests      NONE Performed at [facility], [place] [address] [place], [place] [address]    Culture (A)     <10,000 COLONIES/mL INSIGNIFICANT GROWTH Performed at [facility] [address], [place], [place] [address]    Report Status 09/23/2018 FINAL   Urinalysis, Routine w reflex microscopic     Status: Abnormal   Collection Time: 09/22/18  7:18 AM  Result Value Ref Range   Color, Urine YELLOW YELLOW   APPearance CLEAR CLEAR    Specific Gravity, Urine <1.005 (L) 1.005 - 1.030   pH 6.5 5.0 - 8.0   Glucose, UA NEGATIVE NEGATIVE mg/dL   Hgb urine dipstick MODERATE (A) NEGATIVE   Bilirubin Urine NEGATIVE NEGATIVE   Ketones, ur NEGATIVE NEGATIVE mg/dL   Protein, ur NEGATIVE NEGATIVE mg/dL   Nitrite NEGATIVE NEGATIVE   Leukocytes,Ua NEGATIVE NEGATIVE    Comment: Performed at [facility], [place] [address] [place], [place] [address]  Urinalysis, Microscopic (reflex)     Status: Abnormal   Collection Time: 09/22/18  7:18 AM  Result Value Ref Range   RBC / HPF 0-5 0 - 5 RBC/hpf   WBC, UA 0-5 0 - 5 WBC/hpf   Bacteria, UA RARE (A) NONE SEEN   Squamous Epithelial / LPF 0-5 0 - 5    Comment: Performed at [facility], [place] [address], [place], [place] [address]  Glucose, capillary     Status: Abnormal   Collection Time: 09/22/18  9:29 AM  Result Value Ref Range   Glucose-Capillary 162 (H) 70 - 99 mg/dL  Glucose, capillary     Status: Abnormal   Collection Time: 09/22/18 11:45 AM  Result Value Ref Range   Glucose-Capillary 209 (H) 70 - 99 mg/dL  Glucose, capillary     Status: Abnormal   Collection Time: 09/22/18  4:17 PM  Result Value Ref Range   Glucose-Capillary 340 (H) 70 - 99 mg/dL  Glucose, capillary     Status: Abnormal   Collection Time: 09/22/18  9:07 PM  Result Value Ref Range   Glucose-Capillary 303 (H) 70 - 99 mg/dL  CBC     Status: Abnormal   Collection Time: 09/23/18  2:59 AM  Result Value Ref Range   WBC 5.1 4.0 - 10.5 K/uL   RBC 3.12 (L) 3.87 - 5.11 MIL/uL   Hemoglobin 9.0 (L) 12.0 - 15.0 g/dL   HCT 27.8 (L) 36.0 - 46.0 %   MCV 89.1 80.0 - 100.0 fL   MCH 28.8 26.0 - 34.0 pg   MCHC 32.4 30.0 - 36.0 g/dL   RDW 12.8 11.5 - 15.5 %   Platelets 58 (L) 150 - 400 K/uL    Comment: Immature Platelet Fraction may be clinically  indicated, consider ordering this additional test [id number] CONSISTENT WITH PREVIOUS RESULT    nRBC 0.0 0.0 - 0.2 %    Comment: Performed  at [facility], [place] [address], [place], [place] [address]  Basic metabolic panel     Status: Abnormal   Collection Time: 09/23/18  2:59 AM  Result Value Ref Range   Sodium 135 135 - 145 mmol/L   Potassium 4.1 3.5 - 5.1 mmol/L   Chloride 104 98 - 111 mmol/L   CO2 25 22 - 32 mmol/L   Glucose, Bld 273 (H) 70 - 99 mg/dL   BUN 15 8 - 23 mg/dL   Creatinine, Ser 0.70 0.44 - 1.00 mg/dL   Calcium 8.4 (L) 8.9 - 10.3 mg/dL   GFR calc non Af Amer >60 >60 mL/min   GFR calc Af Amer >60 >60 mL/min   Anion gap 6 5 - 15    Comment: Performed at [facility], [place] [address], [place], [place] [address]  Glucose, capillary     Status: Abnormal   Collection Time: 09/23/18  7:18 AM  Result Value Ref Range   Glucose-Capillary 240 (H) 70 - 99 mg/dL  Glucose, capillary     Status: Abnormal   Collection Time: 09/23/18 11:26 AM  Result Value Ref Range   Glucose-Capillary 257 (H) 70 - 99 mg/dL   Dg Knee Complete 4 Views Right  Result Date: 09/24/2018 CLINICAL DATA:  Right knee replacement, discharged yesterday. Twisting knee getting to the car at discharge with 2 falls tonight. Right knee pain. EXAM: RIGHT KNEE - COMPLETE 4+ VIEW COMPARISON:  None. FINDINGS: The femoral component of right knee arthroplasty is dislocated anteriorly with respect to the tibial tray. Acute periprosthetic fracture of the distal femur, fracture plane not well assessed due to dislocation. Patella appears aligned with the femoral component, there is been patellar resurfacing. Diffuse soft tissue edema. IMPRESSION: Acute fracture dislocation of total right knee arthroplasty with anterior dislocation of the femoral component and associated periprosthetic femoral fracture. Electronically Signed   By: Keith Rake M.D.   On: 09/24/2018 02:25    Pending Labs Unresulted Labs (From admission, onward)    Start     Ordered   09/24/18 0332  HIV antibody (Routine Testing)  Once,   STAT     09/24/18 0334    09/24/18 7494  Basic metabolic panel  Once,   STAT     09/24/18 0147   09/24/18 0148  CBC with Differential  Once,   STAT     09/24/18 0147          Vitals/Pain Today's Vitals   09/24/18 0110 09/24/18 0112 09/24/18 0308 09/24/18 0325  BP: (!) 167/62   (!) 135/59  Pulse: 85   72  Resp: 18   18  Temp: 98.4 F (36.9 C)   99.1 F (37.3 C)  TempSrc: Oral   Oral  SpO2: 91%   94%  PainSc:  6  1      Isolation Precautions No active isolations  Medications Medications  acetaminophen (TYLENOL) tablet 325-650 mg (has no administration in time range)  HYDROcodone-acetaminophen (NORCO/VICODIN) 5-325 MG per tablet 1-2 tablet (has no administration in time range)  HYDROcodone-acetaminophen (NORCO) 7.5-325 MG per tablet 1-2 tablet (has no administration in time range)  morphine 2 MG/ML injection 0.5-1 mg (has no administration in time range)  naproxen (NAPROSYN) tablet 250 mg (has no administration in time range)  methocarbamol (ROBAXIN) tablet 500 mg (has no  administration in time range)    Or  methocarbamol (ROBAXIN) 500 mg in dextrose 5 % 50 mL IVPB (has no administration in time range)  ondansetron (ZOFRAN) tablet 4 mg (has no administration in time range)    Or  ondansetron (ZOFRAN) injection 4 mg (has no administration in time range)  morphine 4 MG/ML injection 4 mg (4 mg Intravenous Given 09/24/18 0248)  ondansetron (ZOFRAN) injection 4 mg (4 mg Intravenous Given 09/24/18 0248)    Mobility

## 2018-09-24 NOTE — H&P (Signed)
ORTHOPAEDIC H and P  REQUESTING PHYSICIAN: Veryl Speak, MD  PCP:  Rosalee Kaufman, PA-C  Chief Complaint: Right knee pain  HPI: Brittany Russo is a 73 y.o. female who complains of right knee pain and swelling that has increased since her recent right total knee arthroplasty.  She was discharged home following that surgery just yesterday.  She states she had 2 falls subsequent to leaving the hospital.  One was just a low-energy fall more of a collapse.  The second was when she got home she did have a fall with details that are not clear to her at this time onto the right knee.  She presented back to the emergency department where she was noted to have a periprosthetic right femur fracture with subluxation of the total knee arthroplasty.  She denies any head injury or upper extremity or left lower extremity pain or injury.  Past Medical History:  Diagnosis Date  . Allergy   . Anxiety   . Arthritis   . Cirrhosis of liver (HCC)    nonalcoholic per patient , reports she takes a medication that makes her have bowel movements  to treat her liver   . Depression   . Diabetes mellitus without complication ([place])    type 2  . Eczema of both hands   . Endometrial cancer ([place]) 2005   treated with radiation therapy    . Headache   . Hyperlipidemia   . Hypertension   . Seizures ([place])    last seizure June 07, 2014   . Thyroid nodule   . Vaginal cancer [facility])    Past Surgical History:  Procedure Laterality Date  . CATARACT EXTRACTION W/PHACO Left 06/04/2017   Procedure: CATARACT EXTRACTION PHACO AND INTRAOCULAR LENS PLACEMENT (IOC);  Surgeon: Baruch Goldmann, MD;  Location: [facility];  Service: Ophthalmology;  Laterality: Left;  CDE: 3.90  . CATARACT EXTRACTION W/PHACO Right 07/15/2017   Procedure: CATARACT EXTRACTION PHACO AND INTRAOCULAR LENS PLACEMENT (IOC);  Surgeon: Baruch Goldmann, MD;  Location: [facility];  Service: Ophthalmology;  Laterality: Right;  CDE: 7.60  . DILATION AND  CURETTAGE OF UTERUS    . TOTAL ABDOMINAL HYSTERECTOMY     Social History   Socioeconomic History  . Marital status: Married    Spouse name: Not on file  . Number of children: Not on file  . Years of education: Not on file  . Highest education level: Not on file  Occupational History  . Not on file  Social Needs  . Financial resource strain: Not on file  . Food insecurity    Worry: Not on file    Inability: Not on file  . Transportation needs    Medical: Not on file    Non-medical: Not on file  Tobacco Use  . Smoking status: Never Smoker  . Smokeless tobacco: Never Used  Substance and Sexual Activity  . Alcohol use: No  . Drug use: No  . Sexual activity: Never  Lifestyle  . Physical activity    Days per week: Not on file    Minutes per session: Not on file  . Stress: Not on file  Relationships  . Social Herbalist on phone: Not on file    Gets together: Not on file    Attends religious service: Not on file    Active member of club or organization: Not on file    Attends meetings of clubs or organizations: Not on file    Relationship  status: Not on file  Other Topics Concern  . Not on file  Social History Narrative  . Not on file   Family History  Problem Relation Age of Onset  . Heart disease Mother   . Diabetes Mother   . Heart disease Father   . Cancer Father   . Cancer Maternal Grandmother   . Heart disease Maternal Grandfather    Allergies  Allergen Reactions  . Asa [Aspirin]     Pt not allergic but cannot take due to bleeding   . Other     Any jewelry metal-hives and itching    Prior to Admission medications   Medication Sig Start Date End Date Taking? Authorizing Provider  candesartan-hydrochlorothiazide (ATACAND HCT) 16-12.5 MG tablet Take 1 tablet by mouth daily.    Yes [provider]  celecoxib (CELEBREX) 200 MG capsule Take 1 capsule (200 mg total) by mouth 2 (two) times daily. 09/22/18  Yes Maurice March, PA-C   cephALEXin (KEFLEX) 500 MG capsule Take 1 capsule (500 mg total) by mouth 3 (three) times daily for 10 days. 09/22/18 10/02/18 Yes Maurice March, PA-C  ferrous sulfate 325 (65 FE) MG tablet Take 1 tablet (325 mg total) by mouth 2 (two) times daily with a meal for 14 days. 09/23/18 10/07/18 Yes Maurice March, PA-C  hydrochlorothiazide (MICROZIDE) 12.5 MG capsule Take 12.5 mg by mouth daily.   Yes [provider]  HYDROcodone-acetaminophen (NORCO) 7.5-325 MG tablet Take 1-2 tablets by mouth every 4 (four) hours as needed for severe pain (pain score 7-10). 09/22/18  Yes Griffith Citron R, PA-C  lactulose (CONSTULOSE) 10 GM/15ML solution Take 10 g by mouth 2 (two) times daily as needed for moderate constipation.    Yes [provider]  methocarbamol (ROBAXIN) 500 MG tablet Take 1 tablet (500 mg total) by mouth every 6 (six) hours as needed for muscle spasms. 09/22/18  Yes Maurice March, PA-C  mupirocin nasal ointment (BACTROBAN) 2 % Place 1 application into the nose 2 (two) times daily. Use one-half of tube in each nostril twice daily for five (5) days. After application, press sides of nose together and gently massage.   Yes [provider]  potassium chloride (K-DUR) 10 MEQ tablet Take 10 mEq by mouth daily.   Yes [provider]  ALPRAZolam (XANAX) 0.25 MG tablet Take 0.25 mg by mouth 2 (two) times daily.     [provider]  aspirin 81 MG chewable tablet Chew 1 tablet (81 mg total) by mouth 2 (two) times daily for 28 days. 09/22/18 10/20/18  Maurice March, PA-C  atenolol (TENORMIN) 50 MG tablet Take 50 mg by mouth daily.    [provider]  cefPROZIL (CEFZIL) 500 MG tablet Take 500 mg by mouth daily.     [provider]  citalopram (CELEXA) 40 MG tablet Take 40 mg by mouth daily.    [provider]  diltiazem (CARTIA XT) 180 MG 24 hr capsule Take 180 mg by mouth daily.    [provider]  fluticasone (FLONASE) 50  MCG/ACT nasal spray Place 1 spray into both nostrils daily.    [provider]  glipiZIDE (GLUCOTROL) 5 MG tablet Take 5 mg by mouth 2 (two) times daily.    [provider]  Insulin Glargine (BASAGLAR KWIKPEN) 100 UNIT/ML SOPN Inject 65 Units into the skin daily.     [provider]  loratadine (CLARITIN) 10 MG tablet Take 10 mg by mouth daily.  [provider]  montelukast (SINGULAIR) 10 MG tablet Take 10 mg by mouth at bedtime.    [provider]  simvastatin (ZOCOR) 40 MG tablet Take 40 mg by mouth at bedtime.    [provider]  sitaGLIPtin-metformin (JANUMET) 50-500 MG per tablet Take 1 tablet by mouth 2 (two) times daily with a meal.    [provider]   Dg Knee Complete 4 Views Right  Result Date: 09/24/2018 CLINICAL DATA:  Right knee replacement, discharged yesterday. Twisting knee getting to the car at discharge with 2 falls tonight. Right knee pain. EXAM: RIGHT KNEE - COMPLETE 4+ VIEW COMPARISON:  None. FINDINGS: The femoral component of right knee arthroplasty is dislocated anteriorly with respect to the tibial tray. Acute periprosthetic fracture of the distal femur, fracture plane not well assessed due to dislocation. Patella appears aligned with the femoral component, there is been patellar resurfacing. Diffuse soft tissue edema. IMPRESSION: Acute fracture dislocation of total right knee arthroplasty with anterior dislocation of the femoral component and associated periprosthetic femoral fracture. Electronically Signed   By: Keith Rake M.D.   On: 09/24/2018 02:25    Positive ROS: All other systems have been reviewed and were otherwise negative with the exception of those mentioned in the HPI and as above.  Physical Exam: General: Alert, no acute distress Cardiovascular: No pedal edema Respiratory: No cyanosis, no use of accessory musculature GI: No organomegaly, abdomen is soft and non-tender Skin: No lesions in  the area of chief complaint Neurologic: Sensation intact distally Psychiatric: Patient is competent for consent with normal mood and affect Lymphatic: No axillary or cervical lymphadenopathy  MUSCULOSKELETAL:   Right lower extremity:  She has moderate knee hemarthrosis.  She has a calf is soft and nontender.  She does have some edema down by the ankle and foot.  Her incision is clean, dry, and intact.  She has 2+ dorsalis pedis as well as posterior tibialis pulse.  Sensation intact to light touch.  No pain with active or passive range of motion at the ankle and foot.  Assessment: 1 .  Right knee periprosthetic fracture, closed.  Plan: -We did apply a compression dressing to the lower extremity on the floor.  This was done while applying gentle traction to the right leg.  We will also place her in a knee immobilizer.  We will get a CT scan to assess the bony impact of this injury.  -She may have a diet today.  She will potentially be reoperated on tomorrow morning with Dr. Alvan Dame or early next week depending on his ability to schedule this and get the appropriate components in place.  -She will be on bedrest and n.p.o. tonight at midnight.  The CT scan will be ordered for following the placement of the knee immobilizer.  -COVID test ordered.    Nicholes Stairs, MD Cell [phone]    09/24/2018 3:29 AM

## 2018-09-24 NOTE — Progress Notes (Addendum)
Discussed case with EDP.  Will admit to Ortho service and keep here until surgical plan can be devised.  Looking at xrays I'm not sure any reduction maneuver would affect position given concomitant fracture and dislocation.  Will follow NV exam closely.  Full consult note to follow.

## 2018-09-24 NOTE — ED Provider Notes (Signed)
[place] DEPT Provider Note   CSN: [id number] Arrival date & time: 09/24/18  0102     History   Chief Complaint Chief Complaint  Patient presents with  . Knee Pain    right     HPI Brittany Russo is a 73 y.o. female.     Patient is a 73 year old female brought by EMS for evaluation of right knee pain.  Patient underwent a total knee replacement yesterday by Dr. Alvan Dame.  She was discharged to home.  This evening she was attempting to transfer from bed to bedside commode when her knee "gave out" and she fell to the floor.  She is complaining of pain in the right knee.  She denies other injury.  The history is provided by the patient.  Knee Pain Location:  Knee Time since incident:  1 hour Injury: yes   Knee location:  R knee Pain details:    Quality:  Aching   Radiates to:  Does not radiate   Severity:  Moderate   Onset quality:  Sudden   Timing:  Constant   Progression:  Unchanged Chronicity:  New Relieved by:  Nothing Worsened by:  Nothing Ineffective treatments:  None tried   Past Medical History:  Diagnosis Date  . Allergy   . Anxiety   . Arthritis   . Cirrhosis of liver (HCC)    nonalcoholic per patient , reports she takes a medication that makes her have bowel movements  to treat her liver   . Depression   . Diabetes mellitus without complication ([place])    type 2  . Eczema of both hands   . Endometrial cancer ([place]) 2005   treated with radiation therapy    . Headache   . Hyperlipidemia   . Hypertension   . Seizures ([place])    last seizure June 07, 2014   . Thyroid nodule   . Vaginal cancer [facility])     Patient Active Problem List   Diagnosis Date Noted  . S/P right TKA 09/22/2018  . Status post total knee replacement, right 09/22/2018  . Type 2 diabetes mellitus with hyperglycemia, with long-term current use of insulin ([place]) 07/11/2015  . Hypertension 07/11/2015  . Hyperlipidemia 07/11/2015    Past Surgical History:   Procedure Laterality Date  . CATARACT EXTRACTION W/PHACO Left 06/04/2017   Procedure: CATARACT EXTRACTION PHACO AND INTRAOCULAR LENS PLACEMENT (IOC);  Surgeon: Baruch Goldmann, MD;  Location: [facility];  Service: Ophthalmology;  Laterality: Left;  CDE: 3.90  . CATARACT EXTRACTION W/PHACO Right 07/15/2017   Procedure: CATARACT EXTRACTION PHACO AND INTRAOCULAR LENS PLACEMENT (IOC);  Surgeon: Baruch Goldmann, MD;  Location: [facility];  Service: Ophthalmology;  Laterality: Right;  CDE: 7.60  . DILATION AND CURETTAGE OF UTERUS    . TOTAL ABDOMINAL HYSTERECTOMY       OB History   No obstetric history on file.      Home Medications    Prior to Admission medications   Medication Sig Start Date End Date Taking? Authorizing Provider  ALPRAZolam (XANAX) 0.25 MG tablet Take 0.25 mg by mouth 2 (two) times daily.     [provider]  aspirin 81 MG chewable tablet Chew 1 tablet (81 mg total) by mouth 2 (two) times daily for 28 days. 09/22/18 10/20/18  Maurice March, PA-C  atenolol (TENORMIN) 50 MG tablet Take 50 mg by mouth daily.    [provider]  celecoxib (CELEBREX) 200 MG capsule Take 1 capsule (200 mg  total) by mouth 2 (two) times daily. 09/22/18   Maurice March, PA-C  cephALEXin (KEFLEX) 500 MG capsule Take 1 capsule (500 mg total) by mouth 3 (three) times daily for 10 days. 09/22/18 10/02/18  Maurice March, PA-C  citalopram (CELEXA) 40 MG tablet Take 40 mg by mouth daily.    [provider]  diltiazem (CARTIA XT) 180 MG 24 hr capsule Take 180 mg by mouth daily.    [provider]  ferrous sulfate 325 (65 FE) MG tablet Take 1 tablet (325 mg total) by mouth 2 (two) times daily with a meal for 14 days. 09/23/18 10/07/18  Maurice March, PA-C  fluticasone (FLONASE) 50 MCG/ACT nasal spray Place 1 spray into both nostrils daily.    [provider]  glipiZIDE (GLUCOTROL) 5 MG tablet Take 5 mg by mouth 2 (two) times daily.    [provider]   hydrochlorothiazide (MICROZIDE) 12.5 MG capsule Take 12.5 mg by mouth daily.    [provider]  HYDROcodone-acetaminophen (NORCO) 7.5-325 MG tablet Take 1-2 tablets by mouth every 4 (four) hours as needed for severe pain (pain score 7-10). 09/22/18   Maurice March, PA-C  Insulin Glargine (BASAGLAR KWIKPEN) 100 UNIT/ML SOPN Inject 65 Units into the skin daily.     [provider]  loratadine (CLARITIN) 10 MG tablet Take 10 mg by mouth daily.    [provider]  methocarbamol (ROBAXIN) 500 MG tablet Take 1 tablet (500 mg total) by mouth every 6 (six) hours as needed for muscle spasms. 09/22/18   Maurice March, PA-C  montelukast (SINGULAIR) 10 MG tablet Take 10 mg by mouth at bedtime.    [provider]  simvastatin (ZOCOR) 40 MG tablet Take 40 mg by mouth at bedtime.    [provider]  sitaGLIPtin-metformin (JANUMET) 50-500 MG per tablet Take 1 tablet by mouth 2 (two) times daily with a meal.    [provider]    Family History Family History  Problem Relation Age of Onset  . Heart disease Mother   . Diabetes Mother   . Heart disease Father   . Cancer Father   . Cancer Maternal Grandmother   . Heart disease Maternal Grandfather     Social History Social History   Tobacco Use  . Smoking status: Never Smoker  . Smokeless tobacco: Never Used  Substance Use Topics  . Alcohol use: No  . Drug use: No     Allergies   Asa [aspirin] and Other   Review of Systems Review of Systems  All other systems reviewed and are negative.    Physical Exam Updated Vital Signs BP (!) 167/62 (BP Location: Left Arm)   Pulse 85   Temp 98.4 F (36.9 C) (Oral)   Resp 18   SpO2 91%   Physical Exam Vitals signs and nursing note reviewed.  Constitutional:      General: She is not in acute distress.    Appearance: She is well-developed. She is not diaphoretic.  HENT:     Head: Normocephalic and atraumatic.  Neck:      Musculoskeletal: Normal range of motion and neck supple.  Cardiovascular:     Rate and Rhythm: Normal rate and regular rhythm.     Heart sounds: No murmur. No friction rub. No gallop.   Pulmonary:     Effort: Pulmonary effort is normal. No respiratory distress.     Breath sounds: Normal breath sounds. No wheezing.  Abdominal:  General: Bowel sounds are normal. There is no distension.     Palpations: Abdomen is soft.     Tenderness: There is no abdominal tenderness.  Musculoskeletal: Normal range of motion.     Comments: The right knee is status post knee replacement.  The surgical dressing is in place and appears intact.  There is some dark blood noted on the gauze.  There is no surrounding erythema.  There is some swelling of the bilateral lower extremities, but right greater than left.  DP pulses are palpable bilaterally.  Skin:    General: Skin is warm and dry.  Neurological:     Mental Status: She is alert and oriented to person, place, and time.      ED Treatments / Results  Labs (all labs ordered are listed, but only abnormal results are displayed) Labs Reviewed  BASIC METABOLIC PANEL  CBC WITH DIFFERENTIAL/PLATELET    EKG None  Radiology No results found.  Procedures Procedures (including critical care time)  Medications Ordered in ED Medications  morphine 4 MG/ML injection 4 mg (has no administration in time range)  ondansetron (ZOFRAN) injection 4 mg (has no administration in time range)     Initial Impression / Assessment and Plan / ED Course  I have reviewed the triage vital signs and the nursing notes.  Pertinent labs & imaging results that were available during my care of the patient were reviewed by me and considered in my medical decision making (see chart for details).  Patient presents here with complaints of right knee pain after a fall at home.  She underwent a total knee replacement yesterday by Dr. Alvan Dame.  Her x-rays today revealed a  periprosthetic fracture of the distal right femur.  This finding was discussed with Dr. Stann Mainland from orthopedics who will admit the patient.  Final Clinical Impressions(s) / ED Diagnoses   Final diagnoses:  None    ED Discharge Orders    None       Veryl Speak, MD 09/24/18 [phone]

## 2018-09-24 NOTE — ED Notes (Signed)
Daughter's name is [name] and her telephone number is [phone]. Daughter would like updates, patient verbalized permission to update daughter.

## 2018-09-24 NOTE — Plan of Care (Signed)

## 2018-09-25 ENCOUNTER — Inpatient Hospital Stay (HOSPITAL_COMMUNITY): Payer: Medicare Other

## 2018-09-25 DIAGNOSIS — R400 Somnolence: Secondary | ICD-10-CM

## 2018-09-25 DIAGNOSIS — M9711XA Periprosthetic fracture around internal prosthetic right knee joint, initial encounter: Secondary | ICD-10-CM

## 2018-09-25 DIAGNOSIS — K7030 Alcoholic cirrhosis of liver without ascites: Secondary | ICD-10-CM

## 2018-09-25 DIAGNOSIS — Z794 Long term (current) use of insulin: Secondary | ICD-10-CM

## 2018-09-25 LAB — URINALYSIS, ROUTINE W REFLEX MICROSCOPIC
Bacteria, UA: NONE SEEN
Bilirubin Urine: NEGATIVE
Glucose, UA: NEGATIVE mg/dL
Ketones, ur: NEGATIVE mg/dL
Leukocytes,Ua: NEGATIVE
Nitrite: NEGATIVE
Protein, ur: NEGATIVE mg/dL
Specific Gravity, Urine: 1.014 (ref 1.005–1.030)
pH: 5 (ref 5.0–8.0)

## 2018-09-25 LAB — COMPREHENSIVE METABOLIC PANEL
ALT: 22 U/L (ref 0–44)
AST: 37 U/L (ref 15–41)
Albumin: 2.9 g/dL — ABNORMAL LOW (ref 3.5–5.0)
Alkaline Phosphatase: 140 U/L — ABNORMAL HIGH (ref 38–126)
Anion gap: 11 (ref 5–15)
BUN: 33 mg/dL — ABNORMAL HIGH (ref 8–23)
CO2: 22 mmol/L (ref 22–32)
Calcium: 8.2 mg/dL — ABNORMAL LOW (ref 8.9–10.3)
Chloride: 93 mmol/L — ABNORMAL LOW (ref 98–111)
Creatinine, Ser: 0.94 mg/dL (ref 0.44–1.00)
GFR calc Af Amer: >60 mL/min (ref >60)
GFR calc non Af Amer: >60 mL/min (ref >60)
Glucose, Bld: 195 mg/dL — ABNORMAL HIGH (ref 70–99)
Potassium: 3.8 mmol/L (ref 3.5–5.1)
Sodium: 126 mmol/L — ABNORMAL LOW (ref 135–145)
Total Bilirubin: 1.2 mg/dL (ref 0.3–1.2)
Total Protein: 5.7 g/dL — ABNORMAL LOW (ref 6.5–8.1)

## 2018-09-25 LAB — CBC
HCT: 24.2 % — ABNORMAL LOW (ref 36.0–46.0)
Hemoglobin: 8.2 g/dL — ABNORMAL LOW (ref 12.0–15.0)
MCH: 29.7 pg (ref 26.0–34.0)
MCHC: 33.9 g/dL (ref 30.0–36.0)
MCV: 87.7 fL (ref 80.0–100.0)
Platelets: 85 10*3/uL — ABNORMAL LOW (ref 150–400)
RBC: 2.76 MIL/uL — ABNORMAL LOW (ref 3.87–5.11)
RDW: 13.4 % (ref 11.5–15.5)
WBC: 7.9 10*3/uL (ref 4.0–10.5)
nRBC: 0 % (ref 0.0–0.2)

## 2018-09-25 LAB — GLUCOSE, CAPILLARY
Glucose-Capillary: 198 mg/dL — ABNORMAL HIGH (ref 70–99)
Glucose-Capillary: 200 mg/dL — ABNORMAL HIGH (ref 70–99)
Glucose-Capillary: 196 mg/dL — ABNORMAL HIGH (ref 70–99)
Glucose-Capillary: 154 mg/dL — ABNORMAL HIGH (ref 70–99)
Glucose-Capillary: 159 mg/dL — ABNORMAL HIGH (ref 70–99)

## 2018-09-25 LAB — TROPONIN I (HIGH SENSITIVITY)
Troponin I (High Sensitivity): 9 ng/L (ref <18)
Troponin I (High Sensitivity): 9 ng/L (ref <18)

## 2018-09-25 LAB — AMMONIA: Ammonia: 66 umol/L — ABNORMAL HIGH (ref 9–35)

## 2018-09-25 LAB — TSH: TSH: 3.151 u[IU]/mL (ref 0.350–4.500)

## 2018-09-25 MED ORDER — POVIDONE-IODINE 10 % EX SWAB: 2.0000 "application " | Freq: Once | CUTANEOUS | Status: DC

## 2018-09-25 MED ORDER — NALOXONE HCL 0.4 MG/ML IJ SOLN
0.4000 mg | INTRAMUSCULAR | Status: DC | PRN
  Administered 2018-09-25: 0.4 mg via INTRAVENOUS

## 2018-09-25 MED ORDER — HYDROCODONE-ACETAMINOPHEN 5-325 MG PO TABS
1.0000 | ORAL_TABLET | Freq: Four times a day (QID) | ORAL | Status: DC | PRN
  Administered 2018-09-26 – 2018-09-27 (×2): 1 via ORAL
  Filled 2018-09-25 (×2): qty 1

## 2018-09-25 MED ORDER — CHLORHEXIDINE GLUCONATE 4 % EX LIQD
60.0000 mL | Freq: Once | CUTANEOUS | Status: AC
  Administered 2018-09-26: 4 via TOPICAL

## 2018-09-25 MED ORDER — SODIUM CHLORIDE 0.9 % IV SOLN
INTRAVENOUS | Status: AC
  Administered 2018-09-25 – 2018-09-26 (×2): via INTRAVENOUS

## 2018-09-25 MED ORDER — ENSURE PRE-SURGERY PO LIQD
296.0000 mL | Freq: Once | ORAL | Status: DC
  Filled 2018-09-25: qty 296

## 2018-09-25 MED ORDER — CEFAZOLIN SODIUM-DEXTROSE 2-4 GM/100ML-% IV SOLN: 2.0000 g | INTRAVENOUS | Status: AC

## 2018-09-25 MED ORDER — LACTULOSE 10 GM/15ML PO SOLN
20.0000 g | Freq: Three times a day (TID) | ORAL | Status: DC
  Administered 2018-09-25 – 2018-09-28 (×5): 20 g via ORAL
  Filled 2018-09-25 (×6): qty 30

## 2018-09-25 MED ORDER — TRANEXAMIC ACID-NACL 1000-0.7 MG/100ML-% IV SOLN: 1000.0000 mg | INTRAVENOUS | Status: AC

## 2018-09-25 MED ORDER — NALOXONE HCL 0.4 MG/ML IJ SOLN
INTRAMUSCULAR | Status: AC
  Administered 2018-09-25: 11:00:00
  Filled 2018-09-25: qty 1

## 2018-09-25 NOTE — Progress Notes (Addendum)
Patient ID: Brittany Russo, female   DOB: 1946/03/03, 73 y.o.   MRN: [id number]  RN Thereasa Distance called regarding patient's mental status. Rapid Response was called. Patient was able to state her first name, and reason for being in the hospital. She was delayed in her responses, and had difficulty with other questions. Last pain medication dose of Norco 10/325 at 0842.   Patient was given dose of Narcan, with some improvement in grip strength per nursing. Patient taken down for CT.  At this time, Hospitalist service was consulted for AMS with right sided weakness. TeleNeurology consulted as well. Hospitalist to continue further evaluation.   Head CT read as normal.

## 2018-09-25 NOTE — Progress Notes (Signed)
The pt's Husband was called and updated regarding the pt's current status and the details regarding the need to call a rapid response earlier this morning. Any questions or concerns were answered/addressed.   I was informed by the Husband that the pt has an addiction to xanax and experiences tremors and delayed response at home when she takes too many xanax. The schedule xanax was held this morning d/t her altered mental status. Griffith Citron, PA was notified.

## 2018-09-25 NOTE — Progress Notes (Addendum)
At 1000, Rapid Response was called regarding the pt's altered mental status. The pt was unable to answer most questions correctly and had delayed responses. Right sided weakness of grip was present and tremors were noted. Pt was given Narcan and taken down for a CT.   At this time, the on-call ortho service was also called regarding the need for rapid response and the pt's change in mental status.   Griffith Citron. PA was notified as well and saw the pt in person during the rapid response.   I will continue to monitor the pt.

## 2018-09-25 NOTE — Consult Note (Signed)
Medical Consultation   KRISTIEN SALATINO  QQI:[id number]  DOB: [birth year]  DOA: 09/24/2018  PCP: Rosalee Kaufman, PA-C   Outpatient Specialists: Hematology at [facility] Dr. Paralee Cancel Pulmonary Dr. Rodman Pickle Cardiology Dr. Harl Bowie Opthalmology Dr. Marisa Hua  Requesting physician: Dr. Paralee Cancel  Reason for consultation: Lethargy, Right sided Weakness, AMS  History of Present Illness: Brittany Russo is an 73 y.o. female with a past medical history significant for but not limited to seasonal allergies, arthritis, depression anxiety, cirrhosis of the liver which is nonalcoholic, depression, endometrial cancer, history of hypertension, hyperlipidemia, diabetes mellitus type 2, history of seizure disorder, history of vaginal cancer and other comorbidities who presented to the hospital with a chief complaint of right knee pain and swelling since her recent right total knee arthroplasty.  She was discharged home following surgery yesterday and when she was arriving at home she had 2 falls leaving the hospital and getting out of her car.  She presented back to the emergency room and she was noted to have a periprosthetic femur fracture with subluxation of total right knee arthroplasty.  She is post go for OR tomorrow however this morning she became severely encephalopathic and somnolent and had decreased right grip strength so code stroke was called and TeleNeurology Evaluated.  TRH was called to assist with her somnolence, altered mental status, and right hand weakness and further work-up was initiated.  Review of Systems:  Review of Systems  Constitutional: Positive for malaise/fatigue. Negative for chills and fever.  HENT: Negative.  Negative for congestion, ear discharge, ear pain, hearing loss, nosebleeds, sinus pain and tinnitus.   Eyes: Negative.  Negative for blurred vision, double vision and photophobia.  Respiratory: Negative.  Negative for cough, hemoptysis, sputum  production and shortness of breath.   Cardiovascular: Negative.  Negative for chest pain, palpitations, orthopnea, claudication and leg swelling.  Gastrointestinal: Negative.  Negative for abdominal pain, diarrhea, heartburn, nausea and vomiting.  Musculoskeletal: Positive for joint pain.  Skin: Negative.   Neurological: Positive for tremors, focal weakness and weakness.  Endo/Heme/Allergies: Negative.  Negative for environmental allergies. Does not bruise/bleed easily.  Psychiatric/Behavioral: Negative.  Negative for hallucinations and substance abuse. The patient does not have insomnia.    As per HPI otherwise 10 point review of systems negative.   Past Medical History:  Diagnosis Date   Allergy    Anxiety    Arthritis    Cirrhosis of liver ([place])    nonalcoholic per patient , reports she takes a medication that makes her have bowel movements  to treat her liver    Depression    Diabetes mellitus without complication ([place])    type 2   Eczema of both hands    Endometrial cancer ([place]) 2005   treated with radiation therapy     Headache    Hyperlipidemia    Hypertension    Seizures ([place])    last seizure June 07, 2014    Thyroid nodule    Vaginal cancer [facility])    Past Surgical History:  Procedure Laterality Date   CATARACT EXTRACTION W/PHACO Left 06/04/2017   Procedure: CATARACT EXTRACTION PHACO AND INTRAOCULAR LENS PLACEMENT ([place]);  Surgeon: Baruch Goldmann, MD;  Location: [facility];  Service: Ophthalmology;  Laterality: Left;  CDE: 3.90   CATARACT EXTRACTION W/PHACO Right 07/15/2017   Procedure: CATARACT EXTRACTION PHACO AND INTRAOCULAR LENS PLACEMENT ([facility]);  Surgeon: Baruch Goldmann, MD;  Location:  [facility];  Service: Ophthalmology;  Laterality: Right;  CDE: 7.60   DILATION AND CURETTAGE OF UTERUS     TOTAL ABDOMINAL HYSTERECTOMY     Allergies  Allergen Reactions   Asa [Aspirin]     Pt not allergic but cannot take due to bleeding    Other     Any jewelry  metal-hives and itching    Social History:  reports that she has never smoked. She has never used smokeless tobacco. She reports that she does not drink alcohol or use drugs.  Family History  Problem Relation Age of Onset   Heart disease Mother    Diabetes Mother    Heart disease Father    Cancer Father    Cancer Maternal Grandmother    Heart disease Maternal Grandfather    Physical Exam: Vitals:   09/24/18 1727 09/24/18 2122 09/25/18 0605 09/25/18 1002  BP: (!) 131/47 (!) 121/48 134/66 (!) 118/58  Pulse: 77 63 71 63  Resp:  18 16 13   Temp:  98.2 F (36.8 C) 98.2 F (36.8 C) 99.1 F (37.3 C)  TempSrc:  Oral  Oral  SpO2: 100% 95% 94% 94%  Weight:      Height:        Constitutional: Somnolent but awake, oriented x2, not in any acute distress. Eyes: irises appear normal, anicteric sclera,  ENMT: external ears and nose appear normal, Grossly normal hearing Neck: neck appears normal, no masses, normal ROM, no thyromegaly, no JVD  CVS: S1-S2 clear, no murmur rubs or gallops, no LE edema, normal pedal pulses  Respiratory:  Diminished to auscultation bilaterally, no wheezing, rales or rhonchi. Respiratory effort normal. No accessory muscle use.  Abdomen: soft nontender, Distended, normal bowel sounds, no hepatosplenomegaly, no hernias  Musculoskeletal: Right Knee in [employer] Neuro: Cranial nerves II-XII intact but patient is slower to respond Psych: judgement and insight appear impaired , Depressed appearing mood and flat affect, mental status seems slow  Skin: no rashes or lesions or ulcers on a limited skin evaluation, no induration or nodules   Data reviewed:  I have personally reviewed following labs and imaging studies Labs:  CBC: Recent Labs  Lab 09/19/18 1435 09/23/18 0259 09/24/18 0308  WBC 4.7 5.1 13.4*  NEUTROABS  --   --  10.4*  HGB 11.6* 9.0* 8.6*  HCT 35.0* 27.8* 26.4*  MCV 90.2 89.1 88.6  PLT 76* 58* 120*    Basic Metabolic Panel: Recent Labs    Lab 09/19/18 1435 09/23/18 0259 09/24/18 0308  NA 140 135 129*  K 4.0 4.1 4.1  CL 106 104 98  CO2 26 25 22   GLUCOSE 83 273* 267*  BUN 13 15 24*  CREATININE 0.72 0.70 0.83  CALCIUM 9.2 8.4* 8.7*   GFR Estimated Creatinine Clearance: 67.8 mL/min (by C-G formula based on SCr of 0.83 mg/dL). Liver Function Tests: No results for input(s): AST, ALT, ALKPHOS, BILITOT, PROT, ALBUMIN in the last 168 hours. No results for input(s): LIPASE, AMYLASE in the last 168 hours. No results for input(s): AMMONIA in the last 168 hours. Coagulation profile No results for input(s): INR, PROTIME in the last 168 hours.  Cardiac Enzymes: No results for input(s): CKTOTAL, CKMB, CKMBINDEX, TROPONINI in the last 168 hours. BNP: Invalid input(s): POCBNP CBG: Recent Labs  Lab 09/23/18 1126 09/24/18 1659 09/24/18 2252 09/25/18 0835 09/25/18 1029  GLUCAP 257* 320* 370* 198* 200*   D-Dimer No results for input(s): DDIMER in the last 72 hours. Hgb A1c No results for  input(s): HGBA1C in the last 72 hours. Lipid Profile No results for input(s): CHOL, HDL, LDLCALC, TRIG, CHOLHDL, LDLDIRECT in the last 72 hours. Thyroid function studies No results for input(s): TSH, T4TOTAL, T3FREE, THYROIDAB in the last 72 hours.  Invalid input(s): FREET3 Anemia work up No results for input(s): VITAMINB12, FOLATE, FERRITIN, TIBC, IRON, RETICCTPCT in the last 72 hours. Urinalysis    Component Value Date/Time   COLORURINE YELLOW 09/22/2018 0718   APPEARANCEUR CLEAR 09/22/2018 0718   LABSPEC <1.005 (L) 09/22/2018 0718   PHURINE 6.5 09/22/2018 0718   GLUCOSEU NEGATIVE 09/22/2018 0718   HGBUR MODERATE (A) 09/22/2018 0718   BILIRUBINUR NEGATIVE 09/22/2018 0718   BILIRUBINUR NEGATIVE 03/06/2013 1100   KETONESUR NEGATIVE 09/22/2018 0718   PROTEINUR NEGATIVE 09/22/2018 0718   UROBILINOGEN negative 03/06/2013 1100   NITRITE NEGATIVE 09/22/2018 0718   LEUKOCYTESUR NEGATIVE 09/22/2018 0718   Microbiology Recent  Results (from the past 240 hour(s))  Surgical pcr screen     Status: None   Collection Time: 09/19/18  1:34 PM   Specimen: Nasal Mucosa; Nasal Swab  Result Value Ref Range Status   MRSA, PCR NEGATIVE NEGATIVE Final   Staphylococcus aureus NEGATIVE NEGATIVE Final    Comment: (NOTE) The Xpert SA Assay (FDA approved for NASAL specimens in patients 57 years of age and older), is one component of a comprehensive surveillance program. It is not intended to diagnose infection nor to guide or monitor treatment. Performed at [facility], [place] [address], [place], [place] [address]   SARS Coronavirus 2 (Performed in [facility] hospital lab)     Status: None   Collection Time: 09/19/18  2:58 PM   Specimen: Nasal Swab  Result Value Ref Range Status   SARS Coronavirus 2 NEGATIVE NEGATIVE Final    Comment: (NOTE) SARS-CoV-2 target nucleic acids are NOT DETECTED. The SARS-CoV-2 RNA is generally detectable in upper and lower respiratory specimens during the acute phase of infection. Negative results do not preclude SARS-CoV-2 infection, do not rule out co-infections with other pathogens, and should not be used as the sole basis for treatment or other patient management decisions. Negative results must be combined with clinical observations, patient history, and epidemiological information. The expected result is Negative. Fact Sheet for Patients: SugarRoll.be Fact Sheet for Healthcare Providers: https://www.woods-mathews.com/ This test is not yet approved or cleared by the [place] FDA and  has been authorized for detection and/or diagnosis of SARS-CoV-2 by FDA under an Emergency Use Authorization (EUA). This EUA will remain  in effect (meaning this test can be used) for the duration of the COVID-19 declaration under Section 56 4(b)(1) of the Act, 21 U.S.C. section 360bbb-3(b)(1), unless the authorization is terminated  or revoked sooner. Performed at [facility], [place] [address], [place], [place] [address]   Urine Culture     Status: Abnormal   Collection Time: 09/22/18  7:18 AM   Specimen: Urine, Clean Catch  Result Value Ref Range Status   Specimen Description   Final    URINE, CLEAN CATCH Performed at [facility], [place] [address] [place], [place] [address]    Special Requests   Final    NONE Performed at [facility], [place] [address] [place], [place] [address]    Culture (A)  Final    <10,000 COLONIES/mL INSIGNIFICANT GROWTH Performed at [place] [address] [place], [place] [address]    Report Status 09/23/2018 FINAL  Final  SARS Coronavirus  2 (CEPHEID - Performed in [place] lab), Hosp Order     Status: None   Collection Time: 09/24/18  8:13 AM   Specimen: Nasopharyngeal Swab  Result Value Ref Range Status   SARS Coronavirus 2 NEGATIVE NEGATIVE Final    Comment: (NOTE) If result is NEGATIVE SARS-CoV-2 target nucleic acids are NOT DETECTED. The SARS-CoV-2 RNA is generally detectable in upper and lower  respiratory specimens during the acute phase of infection. The lowest  concentration of SARS-CoV-2 viral copies this assay can detect is 250  copies / mL. A negative result does not preclude SARS-CoV-2 infection  and should not be used as the sole basis for treatment or other  patient management decisions.  A negative result may occur with  improper specimen collection / handling, submission of specimen other  than nasopharyngeal swab, presence of viral mutation(s) within the  areas targeted by this assay, and inadequate number of viral copies  (<250 copies / mL). A negative result must be combined with clinical  observations, patient history, and epidemiological information. If result is POSITIVE SARS-CoV-2 target nucleic acids are DETECTED. The SARS-CoV-2 RNA is generally detectable in upper and lower   respiratory specimens dur ing the acute phase of infection.  Positive  results are indicative of active infection with SARS-CoV-2.  Clinical  correlation with patient history and other diagnostic information is  necessary to determine patient infection status.  Positive results do  not rule out bacterial infection or co-infection with other viruses. If result is PRESUMPTIVE POSTIVE SARS-CoV-2 nucleic acids MAY BE PRESENT.   A presumptive positive result was obtained on the submitted specimen  and confirmed on repeat testing.  While 2019 novel coronavirus  (SARS-CoV-2) nucleic acids may be present in the submitted sample  additional confirmatory testing may be necessary for epidemiological  and / or clinical management purposes  to differentiate between  SARS-CoV-2 and other Sarbecovirus currently known to infect humans.  If clinically indicated additional testing with an alternate test  methodology [phone]) is advised. The SARS-CoV-2 RNA is generally  detectable in upper and lower respiratory sp ecimens during the acute  phase of infection. The expected result is Negative. Fact Sheet for Patients:  StrictlyIdeas.no Fact Sheet for Healthcare Providers: BankingDealers.co.za This test is not yet approved or cleared by the [place] FDA and has been authorized for detection and/or diagnosis of SARS-CoV-2 by FDA under an Emergency Use Authorization (EUA).  This EUA will remain in effect (meaning this test can be used) for the duration of the COVID-19 declaration under Section 564(b)(1) of the Act, 21 U.S.C. section 360bbb-3(b)(1), unless the authorization is terminated or revoked sooner. Performed at [facility], [place] [address] [place], [place] [address]    Inpatient Medications:   Scheduled Meds:  ALPRAZolam  0.25 mg Oral BID   atenolol  50 mg Oral Daily   cefUROXime  250 mg Oral BID   chlorhexidine  60  mL Topical Once   citalopram  40 mg Oral Daily   diltiazem  180 mg Oral Daily   feeding supplement  296 mL Oral Once   ferrous sulfate  325 mg Oral BID WC   fluticasone  1 spray Each Nare Daily   glipiZIDE  5 mg Oral BID AC   hydrochlorothiazide  12.5 mg Oral Daily   irbesartan  150 mg Oral Daily   And   hydrochlorothiazide  12.5 mg Oral Daily   insulin aspart  0-15 Units Subcutaneous TID WC  insulin aspart  0-5 Units Subcutaneous QHS   insulin glargine  65 Units Subcutaneous Daily   linagliptin  5 mg Oral QAC breakfast   And   metFORMIN  500 mg Oral BID WC   loratadine  10 mg Oral Daily   montelukast  10 mg Oral QHS   naloxone       naproxen  250 mg Oral BID WC   potassium chloride  10 mEq Oral Daily   povidone-iodine  2 application Topical Once   simvastatin  40 mg Oral QHS   Continuous Infusions:   ceFAZolin (ANCEF) IV     methocarbamol (ROBAXIN) IV     tranexamic acid     Radiological Exams on Admission: Ct Knee Right Wo Contrast  Result Date: 09/24/2018 CLINICAL DATA:  Fracture dislocation. Recent total knee arthroplasty with knee injury EXAM: CT OF THE right KNEE WITHOUT CONTRAST TECHNIQUE: Multidetector CT imaging of the right knee was performed according to the standard protocol. Multiplanar CT image reconstructions were also generated. COMPARISON:  Radiograph 09/24/2018 FINDINGS: Examination is limited by significant artifact. As demonstrated on the radiographs there is a complex periprosthetic fracture involving the femoral component with significant varus deformity. There is also a posterior knee dislocation. Possible subtle fracture involving the lateral tibial plateau on the sagittal reformatted images but I can not confirm that on other views and may be artifactual. Appears to be a small avulsion fracture at the tibial tubercle. IMPRESSION: 1. Complex periprosthetic fracture involving the femoral component of the total knee arthroplasty.  Significant varus deformity. 2. Posterior knee dislocation. 3. Avulsion fracture involving the tibial tubercle. No definite other tibial fractures. Electronically Signed   By: Marijo Sanes M.D.   On: 09/24/2018 11:15   Dg Knee Complete 4 Views Right  Result Date: 09/24/2018 CLINICAL DATA:  Right knee replacement, discharged yesterday. Twisting knee getting to the car at discharge with 2 falls tonight. Right knee pain. EXAM: RIGHT KNEE - COMPLETE 4+ VIEW COMPARISON:  None. FINDINGS: The femoral component of right knee arthroplasty is dislocated anteriorly with respect to the tibial tray. Acute periprosthetic fracture of the distal femur, fracture plane not well assessed due to dislocation. Patella appears aligned with the femoral component, there is been patellar resurfacing. Diffuse soft tissue edema. IMPRESSION: Acute fracture dislocation of total right knee arthroplasty with anterior dislocation of the femoral component and associated periprosthetic femoral fracture. Electronically Signed   By: Keith Rake M.D.   On: 09/24/2018 02:25    Impression/Recommendations Active Problems:   Periprosthetic fracture around internal prosthetic right knee joint  Somnolence, right hand weakness, tremors and Acute encephalopathy; concern for Hepatic Encephalopathy given Elevated Ammonia  -Patient was somnolent and lethargic this morning and per nursing had right hand weakness.  Had just been given Norco and was given Narcan with improvement of her right hand weakness -Stat head CT done showed a normal head CT for age with paranasal sinusitis and right maxillary sinus -Tele Neurology was consulted and Dr. Verdis Prime evaluated and felt that there is no role for thrombolytic therapy or emergent vascular imaging.  He recommended evaluating for toxic and metabolic as well as infectious etiologies and felt that hyponatremia may be contributing possible concern for alcohol withdrawal -Dr. Graylon Good recommended an  EEG which I am agreement with -I recommended getting an ammonia level which was elevated going through patient's chart review she does have a history of liver cirrhosis continue lactulose 10 g grams twice daily PRN for moderate constipation however  this was just as needed and we will schedule at 20 mg 3 times daily for her hepatic encephalopathy -Hold her sedating medications for now and recommending holding hydrocodone-acetaminophen and Robaxin -If patient is unable to take p.o. then will need to start lactulose enema -Continue monitor and trend ammonia level and repeat in the a.m. -Obtain ultrasound of the abdomen to evaluate for ascites and if there is ascites start empiric antibiotics to rule out SBP any obtain diagnostic paracentesis -Recommend Hold Sedating Meds including Xanax and Narcotic Pain Regimen  -Patient does have a history of seizures and had a seizure in March 2016 but will be obtaining an EEG per neurology recommendations -Continue to monitor patient closely -Placed on delirium precautions  Liver Cirrhosis  -Likely decompensated and is likely nonalcoholic chart review -Patient takes lactulose at home however this was added as needed and will schedule at 20 mg 3 times daily for now and then cut back to titrate for 3-4 bowel movements a day -LFTs and T bili was normal today -We will obtain abdominal ultrasound to evaluate for ascites  Elevated BUN -BUN is elevated at 33 however creatinine is 0.94 -Continue to monitor for upper GI bleeding; will start gentle IV fluid hydration with normal saline at 75 mL's per hour  Hyponatremia, Hpochloremia -Patient's Na+ was 129 a few days ago and dropped to 126; patient's chloride is now at 93 today -We will give gentle IV fluid hydration with normal saline at 75 mL's per hour for 1 day; Stopped HCTZ 12.5 mg and HCTZ 12.5 mg associated Irbesartan  -Continue to monitor and current clinical repeat CMP in a.m.  Thrombocytopenia -Worsening  in the setting of surgery and liver disease  -Continue to monitor for signs and symptoms of bleeding; currently no overt bleeding noted -Repeat CBC in a.m.  Normocytic Anemia -Patient's hemoglobin/hematocrit went from 8.6/26.1 is now 8.2/24.2 -Check anemia panel in the a.m. -C/w Ferrous Sulfate 325 mg po BID -Continue monitor for signs and symptoms of bleeding; currently no overt bleeding noted -Repeat CBC in a.m.  Diabetes mellitus type 2 -Recommending discontinuing linagliptin, metformin and glipizide while she is hospitalized and starting the patient on Moderate NovoLog sliding scale insulin before meals and at bedtime and adjust as necessary -C/w Lantus 65 units sq Daily  -Last HbA1c 6.2 -Continue to Monitor CBG's closely; Ranging from 198-370 -Will need Diabetes Education Coordinator to Evaluate   Hypertension -C/w Diltiazem and Irbesartan and Atenolol 50 mg po Daily  -Stopped HCTZ  Hyperlipidemia -C/w Simvastatin 40 mg po Daily   Depression and Anxiety -C/w Citalopram 40 mg po Daily  -Recommending holding alprazolam given her acute encephalopathy this morning  Obesity -Estimated body mass index is 38.35 kg/m as calculated from the following:   Height as of this encounter: 5' 2"  (1.575 m).   Weight as of this encounter: 95.1 kg. -Weight Loss and Dietary Counseling given   Thank you for this consultation.  Our [facility] hospitalist team will follow the patient with you.  Time Spent: [place] D.O. [facility] 09/25/2018, 10:43 AM

## 2018-09-25 NOTE — Significant Event (Signed)
Rapid Response Event Note  Overview: Time Called: 1012 Arrival Time: 1015 Event Type: Neurologic  Initial Focused Assessment: Upon arrival to room patient resting in bed with eyes closed. Aroused to minor stimulation. Answers location and situation questions correctly. Not oriented to time. Expressive aphasia noted. R sided weakness, Face symmetrical. Tremors noted. Bedside RN reports pain medication given about 0830- Narcan given. R side grip resolved and equal to left. Na noted to be 129 7/4 but no repeat labs this AM Vital Signs Stable  BP 141/68 (87) NSR 74 93 % RA  Pt has history of seizures last known 2016- no known preventative medications History of Liver Cirrhosis Pt home medications include xanax BID  Interventions: CBG 200 CT Head r/o stroke Tele neuro consulted- MD does not suspect stroke Hospitalist consulted  Plan of Care (if not transferred): Pt transferred to room 1334 from 1338 to be closer to nurses station. Labs CMP, CBC, Ammonia, TSH, and Troponin ordered. EEG ordered.  RN instructed to call Rapid Response with any other concerns       Wray Kearns, RN

## 2018-09-25 NOTE — Consult Note (Signed)
[facility] Tele[facility]urology Consult Services  Date of Service:    09/25/2018 10:48:28  Impression: Acute encephalopathy  Comments: Exam looks most consistent with a diffuse encephalopathic process. She exhibits encephalopathy and diffuse tremulousness/asterixis. Consider toxic, metabolic, or infectious etiologies. I see no role for emergent vascular imaging or thrombolytic therapy in this case as I do not suspect stroke.  Metrics: Last Known Well: Unknown [facility] Notification Time: 09/25/2018 10:48:28 Stamp Time: 09/25/2018 10:48:28 Time First Login Attempt: 09/25/2018 10:53:33 Video Start Time: 09/25/2018 10:53:33 Symptoms: AMS NIHSS Start Assessment Time: 09/25/2018 10:55:59 Patient is not a candidate for tPA. Patient was not deemed candidate for tPA thrombolytics because of Non-focal exam. Video End Time: 09/25/2018 11:05:43 CT head showed no acute hemorrhage or acute core infarct. Clinical Presentation is not Suggestive of Large Vessel Occlusive Disease Our recommendations are outlined below.  Recommendations: -No role for thrombolytic therapy or emergent vascular imaging. -Labs to evaluate for toxic, metabolic, infectious etiologies, most importantly repeat Na. Consider hypoNa.  ?Consider EtOH withdrawal (if applicable history). -EEG.  Sign Out: Discussed with Emergency Department Provider   ------------------------------------------------------------------------------  History of Present Illness: Patient is a 73 year old Female.  Inpatient stroke alert was called for symptoms of AMS.  H/o DM2, HTN, cirrhosis, ?seizure disorder (not on AED). S/p multiple falls. Patient with R periprosthetic R femur/proximal tibial fracture. Unknown time LKW. Now with AMS, responded only partially to Narcan, remains altered. No description of seizure activity, currently no focal deficits. Labs notable for HypoNa 129 yesterday, no repeat  today.  Examination: BP(118/58), Blood Glucose(~200) Lethargic, encephalopathic, no focal deficits.  Diffusely tremulous, asterixis. 1A: Level of Consciousness - Arouses to minor stimulation + 1 1B: Ask Month and Age - 1 Question Right + 1 1C: Blink Eyes & Squeeze Hands - Performs Both Tasks + 0 2: Test Horizontal Extraocular Movements - Normal + 0 3: Test Visual Fields - No Visual Loss + 0 4: Test Facial Palsy (Use Grimace if Obtunded) - Normal symmetry + 0 5A: Test Left Arm Motor Drift - No Drift for 10 Seconds + 0 5B: Test Right Arm Motor Drift - No Drift for 10 Seconds + 0 6A: Test Left Leg Motor Drift - No Drift for 5 Seconds + 0 6B: Test Right Leg Motor Drift - No Drift for 5 Seconds + 0 7: Test Limb Ataxia (FNF/Heel-Shin) - No Ataxia + 0 8: Test Sensation - Normal; No sensory loss + 0 9: Test Language/Aphasia - Severe Aphasia: Fragmentary Expression, Inference Needed, Cannot Identify Materials + 2 10: Test Dysarthria - Normal + 0 11: Test Extinction/Inattention - No abnormality + 0 NIHSS Score: 4  Patient/Family was informed the Neurology Consult would happen via TeleHealth consult by way of interactive audio and video telecommunications and consented to receiving care in this manner.  Due to the immediate potential for life-threatening deterioration due to underlying acute neurologic illness, I spent 35 minutes providing critical care. This time includes time for face to face visit via telemedicine, review of medical records, imaging studies and discussion of findings with providers, the patient and/or family.  Dr Verdis [facility] [phone]  Case [id number]

## 2018-09-25 NOTE — Progress Notes (Signed)
Patient ID: Brittany Russo, female   DOB: [birth year], 73 y.o.   MRN: [id number] Subjective:    [address] while getting out of car at home upon discharge - unforeseen or predictable trauma  Patient reports pain as moderate to severe  Objective:   VITALS:   Vitals:   09/24/18 2122 09/25/18 0605  BP: (!) 121/48 134/66  Pulse: 63 71  Resp: 18 16  Temp: 98.2 F (36.8 C) 98.2 F (36.8 C)  SpO2: 95% 94%    Neurovascular intact  Knee immobilizer in place RLE edema  LABS Recent Labs    09/23/18 0259 09/24/18 0308  HGB 9.0* 8.6*  HCT 27.8* 26.4*  WBC 5.1 13.4*  PLT 58* 120*    Recent Labs    09/23/18 0259 09/24/18 0308  NA 135 129*  K 4.1 4.1  BUN 15 24*  CREATININE 0.70 0.83  GLUCOSE 273* 267*    No results for input(s): LABPT, INR in the last 72 hours.   Assessment/Plan:  Severely comminuted distal periprosthetic femur and proximal tibial fracture with dislocation of components    This represents a very complex problem at this point Regular diet for now  NPO after MN for possible OR tomorrow pending ... Will contact implant company to try and get all necessary components here and available.  Likely will require distal femoral replacement revision due to the proximity to the femoral component versus ORIF  This will put her at risk for further complications but we will do our best to treat accordingly to minimize risks

## 2018-09-25 NOTE — Progress Notes (Signed)
S[place], MD gave verbal orders for a foley.

## 2018-09-26 ENCOUNTER — Inpatient Hospital Stay (HOSPITAL_COMMUNITY): Payer: Medicare Other

## 2018-09-26 ENCOUNTER — Encounter (HOSPITAL_COMMUNITY)
Admission: EM | Disposition: A | Discharge status: Skilled Nursing Facility | Payer: Self-pay | Source: Home / Self Care | Attending: Orthopedic Surgery

## 2018-09-26 ENCOUNTER — Encounter (HOSPITAL_COMMUNITY): Payer: Self-pay | Admitting: Registered Nurse

## 2018-09-26 DIAGNOSIS — S72401A Unspecified fracture of lower end of right femur, initial encounter for closed fracture: Principal | ICD-10-CM

## 2018-09-26 LAB — COMPREHENSIVE METABOLIC PANEL
ALT: 26 U/L (ref 0–44)
AST: 50 U/L — ABNORMAL HIGH (ref 15–41)
Albumin: 2.6 g/dL — ABNORMAL LOW (ref 3.5–5.0)
Alkaline Phosphatase: 136 U/L — ABNORMAL HIGH (ref 38–126)
Anion gap: 12 (ref 5–15)
BUN: 24 mg/dL — ABNORMAL HIGH (ref 8–23)
CO2: 21 mmol/L — ABNORMAL LOW (ref 22–32)
Calcium: 7.9 mg/dL — ABNORMAL LOW (ref 8.9–10.3)
Chloride: 94 mmol/L — ABNORMAL LOW (ref 98–111)
Creatinine, Ser: 0.73 mg/dL (ref 0.44–1.00)
GFR calc Af Amer: >60 mL/min (ref >60)
GFR calc non Af Amer: >60 mL/min (ref >60)
Glucose, Bld: 225 mg/dL — ABNORMAL HIGH (ref 70–99)
Potassium: 3.8 mmol/L (ref 3.5–5.1)
Sodium: 127 mmol/L — ABNORMAL LOW (ref 135–145)
Total Bilirubin: 1.3 mg/dL — ABNORMAL HIGH (ref 0.3–1.2)
Total Protein: 5.4 g/dL — ABNORMAL LOW (ref 6.5–8.1)

## 2018-09-26 LAB — CBC WITH DIFFERENTIAL/PLATELET
Abs Immature Granulocytes: 0.07 10*3/uL (ref 0.00–0.07)
Basophils Absolute: 0 10*3/uL (ref 0.0–0.1)
Basophils Relative: 0 %
Eosinophils Absolute: 0.3 10*3/uL (ref 0.0–0.5)
Eosinophils Relative: 4 %
HCT: 25.2 % — ABNORMAL LOW (ref 36.0–46.0)
Hemoglobin: 8 g/dL — ABNORMAL LOW (ref 12.0–15.0)
Immature Granulocytes: 1 %
Lymphocytes Relative: 13 %
Lymphs Abs: 1 10*3/uL (ref 0.7–4.0)
MCH: 28.4 pg (ref 26.0–34.0)
MCHC: 31.7 g/dL (ref 30.0–36.0)
MCV: 89.4 fL (ref 80.0–100.0)
Monocytes Absolute: 0.8 10*3/uL (ref 0.1–1.0)
Monocytes Relative: 11 %
Neutro Abs: 5.5 10*3/uL (ref 1.7–7.7)
Neutrophils Relative %: 71 %
Platelets: 94 10*3/uL — ABNORMAL LOW (ref 150–400)
RBC: 2.82 MIL/uL — ABNORMAL LOW (ref 3.87–5.11)
RDW: 13.4 % (ref 11.5–15.5)
WBC: 7.6 10*3/uL (ref 4.0–10.5)
nRBC: 0 % (ref 0.0–0.2)

## 2018-09-26 LAB — SURGICAL PCR SCREEN
MRSA, PCR: NEGATIVE
Staphylococcus aureus: NEGATIVE

## 2018-09-26 LAB — HIV ANTIBODY (ROUTINE TESTING W REFLEX): HIV Screen 4th Generation wRfx: NONREACTIVE

## 2018-09-26 LAB — GLUCOSE, CAPILLARY
Glucose-Capillary: 221 mg/dL — ABNORMAL HIGH (ref 70–99)
Glucose-Capillary: 187 mg/dL — ABNORMAL HIGH (ref 70–99)
Glucose-Capillary: 204 mg/dL — ABNORMAL HIGH (ref 70–99)
Glucose-Capillary: 182 mg/dL — ABNORMAL HIGH (ref 70–99)

## 2018-09-26 LAB — PHOSPHORUS: Phosphorus: 2.7 mg/dL (ref 2.5–4.6)

## 2018-09-26 LAB — AMMONIA: Ammonia: 41 umol/L — ABNORMAL HIGH (ref 9–35)

## 2018-09-26 LAB — MAGNESIUM: Magnesium: 2 mg/dL (ref 1.7–2.4)

## 2018-09-26 SURGERY — TOTAL KNEE REVISION
Anesthesia: Choice | Laterality: Right

## 2018-09-26 MED ORDER — PROPOFOL 10 MG/ML IV BOLUS
INTRAVENOUS | Status: AC
  Filled 2018-09-26: qty 60

## 2018-09-26 MED ORDER — DEXAMETHASONE SODIUM PHOSPHATE 10 MG/ML IJ SOLN
INTRAMUSCULAR | Status: AC
  Filled 2018-09-26: qty 1

## 2018-09-26 MED ORDER — ONDANSETRON HCL 4 MG/2ML IJ SOLN
INTRAMUSCULAR | Status: AC
  Filled 2018-09-26: qty 2

## 2018-09-26 MED ORDER — FENTANYL CITRATE (PF) 100 MCG/2ML IJ SOLN
INTRAMUSCULAR | Status: AC
  Filled 2018-09-26: qty 2

## 2018-09-26 MED ORDER — SODIUM CHLORIDE 0.9 % IV SOLN
INTRAVENOUS | Status: DC
  Administered 2018-09-26 – 2018-09-27 (×5): via INTRAVENOUS

## 2018-09-26 MED ORDER — HYDRALAZINE HCL 20 MG/ML IJ SOLN
10.0000 mg | Freq: Four times a day (QID) | INTRAMUSCULAR | Status: DC | PRN
  Administered 2018-09-28: 02:00:00 via INTRAVENOUS
  Filled 2018-09-26: qty 1

## 2018-09-26 MED ORDER — MIDAZOLAM HCL 2 MG/2ML IJ SOLN
INTRAMUSCULAR | Status: AC
  Filled 2018-09-26: qty 2

## 2018-09-26 NOTE — Progress Notes (Signed)
RRT called to evaluate pt mental status.  Pt Alert and f/c.  Able to answer questions and reason appropriately.  VSS on RA see chart.  Pt was seen earlier by RRT as well and taken to CT for evaluation, also.  Encouraged RN to call back if pt developed any changes.

## 2018-09-26 NOTE — Progress Notes (Signed)
Spoke with patient's spouse [name] who was concerned that patient was not responding to him. As per previous notes written this morning, patient was screened for possible CVA and same was negative. However, patient was alert when I went in her room but was slow to respond to questions. Spouse was getting upset that patient's status had changed. Based on my assessment, I noted no changes in patient's neurological status from change of shift. I later spoke with her son [name] Brooke Bonito) who stated that patient has tremors and is "spaced" out at times. She was alert and oriented x 3 except for time. She was evaluated by Rapid Response Nurse who aalso noted no acute neurological changes.Will continue q4 neurological checks.

## 2018-09-26 NOTE — Progress Notes (Signed)
Patient ID: Brittany Russo, female   DOB: Nov 21, 1945, 73 y.o.   MRN: [id number] Subjective:      Patient reports pain as moderate to severe Events from yesterday and images reviewed  Objective:   VITALS:   Vitals:   09/25/18 2159 09/26/18 0508  BP: (!) 158/54 (!) 142/51  Pulse: 80 66  Resp: 17 16  Temp: 98.9 F (37.2 C) 98.8 F (37.1 C)  SpO2: 96% 98%    Neurovascular intact  LABS Recent Labs    09/24/18 0308 09/25/18 1119  HGB 8.6* 8.2*  HCT 26.4* 24.2*  WBC 13.4* 7.9  PLT 120* 85*    Recent Labs    09/24/18 0308 09/25/18 1119  NA 129* 126*  K 4.1 3.8  BUN 24* 33*  CREATININE 0.83 0.94  GLUCOSE 267* 195*    No results for input(s): LABPT, INR in the last 72 hours.   Assessment/Plan:  Complex right distal femur periprosthetic femur fracture  Plan NPO To OR today for revision of right knee Monitor glucose Blood consent

## 2018-09-26 NOTE — Progress Notes (Signed)
Called to schedule EEG; nurse advised patient is going to surgery this afternoon. Will do EEG tomorrow as schedule permits.

## 2018-09-26 NOTE — Anesthesia Preprocedure Evaluation (Deleted)
Anesthesia Evaluation    Reviewed: Allergy & Precautions, Patient's Chart, lab work & pertinent test results  Airway Mallampati: III  TM Distance: >3 FB Neck ROM: Full    Dental no notable dental hx. (+) Teeth Intact   Pulmonary neg pulmonary ROS,    Pulmonary exam normal breath sounds clear to auscultation       Cardiovascular hypertension, Pt. on medications and Pt. on home beta blockers Normal cardiovascular exam Rhythm:Regular Rate:Normal     Neuro/Psych  Headaches, Seizures -, Well Controlled,  PSYCHIATRIC DISORDERS Anxiety Depression    GI/Hepatic negative GI ROS, (+) Cirrhosis       ,  NASH   Endo/Other  diabetes, Well Controlled, Type 2, Insulin Dependent, Oral Hypoglycemic Agents Hyperlipidemia Obesity Hyponatremia   Renal/GU negative Renal ROS  negative genitourinary   Musculoskeletal  (+) Arthritis , Osteoarthritis,  OA right knee   Abdominal   Peds  Hematology  (+) anemia ,  Thrombocytopenia   Anesthesia Other Findings   Reproductive/Obstetrics Hx/o Endometrial Ca S/P RT 2005                             Anesthesia Physical  Anesthesia Plan  ASA: III  Anesthesia Plan: Spinal   Post-op Pain Management:  Regional for Post-op pain   Induction: Intravenous  PONV Risk Score and Plan: 3 and Ondansetron, Treatment may vary due to age or medical condition and Propofol infusion  Airway Management Planned: Natural Airway and Simple Face Mask  Additional Equipment:   Intra-op Plan:   Post-operative Plan:   Informed Consent:   Plan Discussed with: CRNA and Anesthesiologist  Anesthesia Plan Comments:         Anesthesia Quick Evaluation

## 2018-09-26 NOTE — Progress Notes (Signed)
PROGRESS NOTE    Brittany Russo  JSH:[id number] DOB: Aug 08, 1945 DOA: 09/24/2018 PCP: Rosalee Kaufman, PA-C   Brief Narrative: 73 year old with past medical history significant for depression, anxiety, cirrhosis of the liver nonalcoholic, depression, endometrial cancer, history of hypertension, diabetes, history of seizure, history of vaginal cancer who presents to the hospital complaining of right knee pain and is swelling seen her recent right total knee arthroplasty.  She was discharged home following surgery when she was arriving at home she had 2 falls leaving the hospital and getting out of her car.  She presented back to the emergency department and was found to have a periprosthetic femur fracture with subluxation of total right knee arthroplasty. Patient was supposed to go to the OR the morning of 6 July 5 but she was found to be very encephalopathic and somnolent, code stroke was called and telemetry neurology evaluated patient. [facility] was consulted to help with medical management.    Assessment & Plan:   Active Problems:   Periprosthetic fracture around internal prosthetic right knee joint  1-acute hepatic encephalopathy: Patient was somnolent, right hand weakness and tremors. Ammonia was elevated. Code stroke was activated on 7/5.  CT head was negative for acute intracranial bleed. Tele-neurology was consulted, Dr. Graylon Good did not recommend thrombolytic therapy or emergent vascular imaging.  Patient presentation likely related to hepatic encephalopathy. -EEG awaiting results -Continue with lactulose -Patient this morning is alert and oriented x3, she is following commands. -Ammonia level trending down  Cirrhosis: Nonalcoholic Continue with lactulose. Abdominal ultrasound: Mild ascites Mild elevation of AST, bilirubin 1.3.  Hyponatremia, hypochloremia Agree with holding hydrochlorothiazide Mild improved hyponatremia.  Continue with IV fluids.  We will hold irbesartan    Thrombocytopenia: Related to cirrhosis.  Stable.  Normocytic anemia: Continue with ferrous sulfate. Follow anemia panel Hemoglobin stable  Diabetes Continue to hold linagliptin, metformin and glipizide Sliding scale insulin Continue with Lantus  Hypertension: Continue with diltiazem, irbesartan, atenolol.  Depression, anxiety Continue with citalopram  Obesity   Estimated body mass index is 38.35 kg/m as calculated from the following:   Height as of this encounter: 5' 2"  (1.575 m).   Weight as of this encounter: 95.1 kg.   DVT prophylaxis: Per Ortho Code Status: Full code Family Communication: Per Ortho Disposition Plan: For surgery today    Procedures:     Antimicrobials:    Subjective: Patient seen and examined at this morning.  She is lying down in bed.  She is alert and oriented x3.  She is able to move both upper extremity.  Objective: Vitals:   09/25/18 2033 09/25/18 2159 09/26/18 0508 09/26/18 1019  BP: 132/62 (!) 158/54 (!) 142/51 (!) 155/68  Pulse: 78 80 66 79  Resp: 18 17 16 16   Temp: 98.9 F (37.2 C) 98.9 F (37.2 C) 98.8 F (37.1 C) 98.1 F (36.7 C)  TempSrc: Oral Oral Oral Oral  SpO2: 96% 96% 98% 97%  Weight:      Height:        Intake/Output Summary (Last 24 hours) at 09/26/2018 1454 Last data filed at 09/26/2018 1000 Gross per 24 hour  Intake 1612.11 ml  Output 1500 ml  Net 112.11 ml   Filed Weights   09/24/18 0450  Weight: 95.1 kg    Examination:  General exam: Appears calm and comfortable  Respiratory system: Clear to auscultation. Respiratory effort normal. Cardiovascular system: S1 & S2 heard, RRR. No JVD, murmurs, rubs, gallops or clicks. No pedal edema. Gastrointestinal system: Abdomen  is nondistended, soft and nontender. No organomegaly or masses felt. Normal bowel sounds heard. Central nervous system: Alert and oriented. No focal neurological deficits. Extremities: Symmetric 5 x 5 power. Right knee immobilizer.   Skin: No rashes, lesions or ulcers Psychiatry: Mood & affect appropriate.     Data Reviewed: I have personally reviewed following labs and imaging studies  CBC: Recent Labs  Lab 09/23/18 0259 09/24/18 0308 09/25/18 1119 09/26/18 0818  WBC 5.1 13.4* 7.9 7.6  NEUTROABS  --  10.4*  --  5.5  HGB 9.0* 8.6* 8.2* 8.0*  HCT 27.8* 26.4* 24.2* 25.2*  MCV 89.1 88.6 87.7 89.4  PLT 58* 120* 85* 94*   Basic Metabolic Panel: Recent Labs  Lab 09/23/18 0259 09/24/18 0308 09/25/18 1119 09/26/18 0818  NA 135 129* 126* 127*  K 4.1 4.1 3.8 3.8  CL 104 98 93* 94*  CO2 25 22 22  21*  GLUCOSE 273* 267* 195* 225*  BUN 15 24* 33* 24*  CREATININE 0.70 0.83 0.94 0.73  CALCIUM 8.4* 8.7* 8.2* 7.9*  MG  --   --   --  2.0  PHOS  --   --   --  2.7   GFR: Estimated Creatinine Clearance: 70.3 mL/min (by C-G formula based on SCr of 0.73 mg/dL). Liver Function Tests: Recent Labs  Lab 09/25/18 1119 09/26/18 0818  AST 37 50*  ALT 22 26  ALKPHOS 140* 136*  BILITOT 1.2 1.3*  PROT 5.7* 5.4*  ALBUMIN 2.9* 2.6*   No results for input(s): LIPASE, AMYLASE in the last 168 hours. Recent Labs  Lab 09/25/18 1119 09/26/18 0814  AMMONIA 66* 41*   Coagulation Profile: No results for input(s): INR, PROTIME in the last 168 hours. Cardiac Enzymes: No results for input(s): CKTOTAL, CKMB, CKMBINDEX, TROPONINI in the last 168 hours. BNP (last 3 results) No results for input(s): PROBNP in the last 8760 hours. HbA1C: No results for input(s): HGBA1C in the last 72 hours. CBG: Recent Labs  Lab 09/25/18 1212 09/25/18 1757 09/25/18 2035 09/26/18 0725 09/26/18 1232  GLUCAP 196* 154* 159* 221* 187*   Lipid Profile: No results for input(s): CHOL, HDL, LDLCALC, TRIG, CHOLHDL, LDLDIRECT in the last 72 hours. Thyroid Function Tests: Recent Labs    09/25/18 1119  TSH 3.151   Anemia Panel: No results for input(s): VITAMINB12, FOLATE, FERRITIN, TIBC, IRON, RETICCTPCT in the last 72 hours. Sepsis  Labs: No results for input(s): PROCALCITON, LATICACIDVEN in the last 168 hours.  Recent Results (from the past 240 hour(s))  Surgical pcr screen     Status: None   Collection Time: 09/19/18  1:34 PM   Specimen: Nasal Mucosa; Nasal Swab  Result Value Ref Range Status   MRSA, PCR NEGATIVE NEGATIVE Final   Staphylococcus aureus NEGATIVE NEGATIVE Final    Comment: (NOTE) The Xpert SA Assay (FDA approved for NASAL specimens in patients 19 years of age and older), is one component of a comprehensive surveillance program. It is not intended to diagnose infection nor to guide or monitor treatment. Performed at [facility], [place] [address] [place], [place] [address]   SARS Coronavirus 2 (Performed in [facility] hospital lab)     Status: None   Collection Time: 09/19/18  2:58 PM   Specimen: Nasal Swab  Result Value Ref Range Status   SARS Coronavirus 2 NEGATIVE NEGATIVE Final    Comment: (NOTE) SARS-CoV-2 target nucleic acids are NOT DETECTED. The SARS-CoV-2 RNA is generally detectable in upper and lower respiratory specimens  during the acute phase of infection. Negative results do not preclude SARS-CoV-2 infection, do not rule out co-infections with other pathogens, and should not be used as the sole basis for treatment or other patient management decisions. Negative results must be combined with clinical observations, patient history, and epidemiological information. The expected result is Negative. Fact Sheet for Patients: SugarRoll.be Fact Sheet for Healthcare Providers: https://www.woods-mathews.com/ This test is not yet approved or cleared by the [place] FDA and  has been authorized for detection and/or diagnosis of SARS-CoV-2 by FDA under an Emergency Use Authorization (EUA). This EUA will remain  in effect (meaning this test can be used) for the duration of the COVID-19 declaration under Section 56 4(b)(1) of  the Act, 21 U.S.C. section 360bbb-3(b)(1), unless the authorization is terminated or revoked sooner. Performed at [facility], [place] [address], [place], [place] [address]   Urine Culture     Status: Abnormal   Collection Time: 09/22/18  7:18 AM   Specimen: Urine, Clean Catch  Result Value Ref Range Status   Specimen Description   Final    URINE, CLEAN CATCH Performed at [facility], [place] [address], [place], [place] [address]    Special Requests   Final    NONE Performed at [facility], [place] [address] [place], [place] [address]    Culture (A)  Final    <10,000 COLONIES/mL INSIGNIFICANT GROWTH Performed at [place] [address], [place], [place] [address]    Report Status 09/23/2018 FINAL  Final  SARS Coronavirus 2 (CEPHEID - Performed in [place] hospital lab), Hosp Order     Status: None   Collection Time: 09/24/18  8:13 AM   Specimen: Nasopharyngeal Swab  Result Value Ref Range Status   SARS Coronavirus 2 NEGATIVE NEGATIVE Final    Comment: (NOTE) If result is NEGATIVE SARS-CoV-2 target nucleic acids are NOT DETECTED. The SARS-CoV-2 RNA is generally detectable in upper and lower  respiratory specimens during the acute phase of infection. The lowest  concentration of SARS-CoV-2 viral copies this assay can detect is 250  copies / mL. A negative result does not preclude SARS-CoV-2 infection  and should not be used as the sole basis for treatment or other  patient management decisions.  A negative result may occur with  improper specimen collection / handling, submission of specimen other  than nasopharyngeal swab, presence of viral mutation(s) within the  areas targeted by this assay, and inadequate number of viral copies  (<250 copies / mL). A negative result must be combined with clinical  observations, patient history, and epidemiological information. If result is POSITIVE SARS-CoV-2 target nucleic acids are  DETECTED. The SARS-CoV-2 RNA is generally detectable in upper and lower  respiratory specimens dur ing the acute phase of infection.  Positive  results are indicative of active infection with SARS-CoV-2.  Clinical  correlation with patient history and other diagnostic information is  necessary to determine patient infection status.  Positive results do  not rule out bacterial infection or co-infection with other viruses. If result is PRESUMPTIVE POSTIVE SARS-CoV-2 nucleic acids MAY BE PRESENT.   A presumptive positive result was obtained on the submitted specimen  and confirmed on repeat testing.  While 2019 novel coronavirus  (SARS-CoV-2) nucleic acids may be present in the submitted sample  additional confirmatory testing may be necessary for epidemiological  and / or clinical management purposes  to differentiate between  SARS-CoV-2 and other Sarbecovirus currently  known to infect humans.  If clinically indicated additional testing with an alternate test  methodology [phone]) is advised. The SARS-CoV-2 RNA is generally  detectable in upper and lower respiratory sp ecimens during the acute  phase of infection. The expected result is Negative. Fact Sheet for Patients:  StrictlyIdeas.no Fact Sheet for Healthcare Providers: BankingDealers.co.za This test is not yet approved or cleared by the [place] FDA and has been authorized for detection and/or diagnosis of SARS-CoV-2 by FDA under an Emergency Use Authorization (EUA).  This EUA will remain in effect (meaning this test can be used) for the duration of the COVID-19 declaration under Section 564(b)(1) of the Act, 21 U.S.C. section 360bbb-3(b)(1), unless the authorization is terminated or revoked sooner. Performed at [facility], [place] [address], [place], [place] [address]   Surgical pcr screen     Status: None   Collection Time: 09/26/18 12:10 AM   Specimen:  Nasal Mucosa; Nasal Swab  Result Value Ref Range Status   MRSA, PCR NEGATIVE NEGATIVE Final   Staphylococcus aureus NEGATIVE NEGATIVE Final    Comment: (NOTE) The Xpert SA Assay (FDA approved for NASAL specimens in patients 64 years of age and older), is one component of a comprehensive surveillance program. It is not intended to diagnose infection nor to guide or monitor treatment. Performed at [facility], [place] [address], [place], [place] [address]          Radiology Studies: US Abdomen Complete  Result Date: 09/26/2018 CLINICAL DATA:  Cirrhosis. EXAM: ABDOMEN ULTRASOUND COMPLETE COMPARISON:  Abdominal ultrasound 04/19/2015. MRCP 04/26/2015 and CT abdomen 04/07/2017. FINDINGS: Gallbladder: Mild gallbladder wall thickening without sonographic Murphy's sign. No evidence of gallstones. Common bile duct: Diameter: 6 mm Liver: There are morphologic changes of cirrhosis with heterogeneity of the hepatic parenchyma. No focal lesions are identified. Portal vein is patent on color Doppler imaging with normal direction of blood flow towards the liver. IVC: No abnormality visualized. Pancreas: Visualized portion unremarkable. Spleen: Size and appearance within normal limits. Right Kidney: Length: 9.5 cm. Echogenicity within normal limits. No mass or hydronephrosis visualized. Left Kidney: Length: 10.5 cm. Echogenicity within normal limits. No mass or hydronephrosis visualized. Abdominal aorta: No aneurysm visualized. Portions of the aorta are obscured by bowel gas. Other findings: A small amount of ascites is present in the right and left upper quadrants of the abdomen. IMPRESSION: 1. Morphologic changes of cirrhosis without acute or focal abnormality identified. 2. Mild nonspecific gallbladder wall thickening, likely related to underlying liver disease. 3. Mild ascites. Electronically Signed   By: Richardean Sale M.D.   On: 09/26/2018 09:32   Ct Head Code Stroke Wo  Contrast  Result Date: 09/25/2018 CLINICAL DATA:  Code stroke. Slurred speech. Tremors. Altered mental status. Week graft right. EXAM: CT HEAD WITHOUT CONTRAST TECHNIQUE: Contiguous axial images were obtained from the base of the skull through the vertex without intravenous contrast. COMPARISON:  06/08/2015 FINDINGS: Brain: Mild age related volume. No evidence of old or acute focal infarction, mass lesion, hydrocephalus or extra-axial collection. Vascular: No abnormal vascular finding Skull: Normal Sinuses/Orbits: Inflammatory changes of the paranasal sinuses affecting sinus Other: None ASPECTS ([place] Stroke Program Early CT Score) - Ganglionic level infarction (caudate, lentiform nuclei, internal capsule, insula, M1-M3 cortex): 7 - Supraganglionic infarction (M4-M6 cortex): 3 Total score (0-10 with 10 being normal): 10 IMPRESSION: 1. Normal head CT for age. 2. Paranasal sinusitis worse in the right maxillary sinus. 3. ASPECTS is 10. 4. I spoke to the  patient's nurse at 1050 hours, but we are unable to reach a covering physician but will keep trying. Electronically Signed   By: Nelson Chimes M.D.   On: 09/25/2018 10:55        Scheduled Meds:  ALPRAZolam  0.25 mg Oral BID   atenolol  50 mg Oral Daily   cefUROXime  250 mg Oral BID   citalopram  40 mg Oral Daily   diltiazem  180 mg Oral Daily   feeding supplement  296 mL Oral Once   ferrous sulfate  325 mg Oral BID WC   fluticasone  1 spray Each Nare Daily   insulin aspart  0-15 Units Subcutaneous TID WC   insulin aspart  0-5 Units Subcutaneous QHS   insulin glargine  65 Units Subcutaneous Daily   irbesartan  150 mg Oral Daily   lactulose  20 g Oral TID   loratadine  10 mg Oral Daily   montelukast  10 mg Oral QHS   naproxen  250 mg Oral BID WC   potassium chloride  10 mEq Oral Daily   povidone-iodine  2 application Topical Once   simvastatin  40 mg Oral QHS   Continuous Infusions:  methocarbamol (ROBAXIN) IV        LOS: 2 days    Time spent: 35 minutes     Elmarie Shiley, MD [facility] Pager [phone]  If 7PM-7AM, please contact night-coverage [email or web address] Password [email or web address] 09/26/2018, 2:54 PM

## 2018-09-27 ENCOUNTER — Inpatient Hospital Stay (HOSPITAL_COMMUNITY): Payer: Medicare Other | Admitting: Anesthesiology

## 2018-09-27 ENCOUNTER — Encounter (HOSPITAL_COMMUNITY): Payer: Self-pay

## 2018-09-27 ENCOUNTER — Encounter (HOSPITAL_COMMUNITY)
Admission: EM | Disposition: A | Discharge status: Skilled Nursing Facility | Payer: Self-pay | Source: Home / Self Care | Attending: Orthopedic Surgery

## 2018-09-27 HISTORY — PX: TOTAL KNEE REVISION: SHX996

## 2018-09-27 LAB — CBC
HCT: 24.3 % — ABNORMAL LOW (ref 36.0–46.0)
Hemoglobin: 8.1 g/dL — ABNORMAL LOW (ref 12.0–15.0)
MCH: 29.5 pg (ref 26.0–34.0)
MCHC: 33.3 g/dL (ref 30.0–36.0)
MCV: 88.4 fL (ref 80.0–100.0)
Platelets: 71 10*3/uL — ABNORMAL LOW (ref 150–400)
RBC: 2.75 MIL/uL — ABNORMAL LOW (ref 3.87–5.11)
RDW: 14 % (ref 11.5–15.5)
WBC: 4.8 10*3/uL (ref 4.0–10.5)
nRBC: 0 % (ref 0.0–0.2)

## 2018-09-27 LAB — GLUCOSE, CAPILLARY
Glucose-Capillary: 109 mg/dL — ABNORMAL HIGH (ref 70–99)
Glucose-Capillary: 143 mg/dL — ABNORMAL HIGH (ref 70–99)
Glucose-Capillary: 135 mg/dL — ABNORMAL HIGH (ref 70–99)
Glucose-Capillary: 159 mg/dL — ABNORMAL HIGH (ref 70–99)
Glucose-Capillary: 182 mg/dL — ABNORMAL HIGH (ref 70–99)

## 2018-09-27 LAB — BASIC METABOLIC PANEL
Anion gap: 7 (ref 5–15)
BUN: 16 mg/dL (ref 8–23)
CO2: 26 mmol/L (ref 22–32)
Calcium: 7.8 mg/dL — ABNORMAL LOW (ref 8.9–10.3)
Chloride: 97 mmol/L — ABNORMAL LOW (ref 98–111)
Creatinine, Ser: 0.44 mg/dL (ref 0.44–1.00)
GFR calc Af Amer: >60 mL/min (ref >60)
GFR calc non Af Amer: >60 mL/min (ref >60)
Glucose, Bld: 134 mg/dL — ABNORMAL HIGH (ref 70–99)
Potassium: 3.5 mmol/L (ref 3.5–5.1)
Sodium: 130 mmol/L — ABNORMAL LOW (ref 135–145)

## 2018-09-27 LAB — PREPARE RBC (CROSSMATCH)

## 2018-09-27 LAB — HEMOGLOBIN AND HEMATOCRIT, BLOOD
HCT: 27.3 % — ABNORMAL LOW (ref 36.0–46.0)
Hemoglobin: 9 g/dL — ABNORMAL LOW (ref 12.0–15.0)

## 2018-09-27 SURGERY — TOTAL KNEE REVISION
Anesthesia: General | Laterality: Right

## 2018-09-27 MED ORDER — OXYCODONE HCL 5 MG/5ML PO SOLN: 5.0000 mg | Freq: Once | ORAL | Status: DC | PRN

## 2018-09-27 MED ORDER — HYDROMORPHONE HCL 1 MG/ML IJ SOLN
INTRAMUSCULAR | Status: AC
  Filled 2018-09-27: qty 1

## 2018-09-27 MED ORDER — SODIUM CHLORIDE 0.9 % IV SOLN
INTRAVENOUS | Status: DC | PRN
  Administered 2018-09-27: 25 ug/min via INTRAVENOUS

## 2018-09-27 MED ORDER — HYDROMORPHONE HCL 1 MG/ML IJ SOLN
0.2500 mg | INTRAMUSCULAR | Status: DC | PRN
  Administered 2018-09-27 (×2): 0.5 mg via INTRAVENOUS

## 2018-09-27 MED ORDER — MORPHINE SULFATE (PF) 2 MG/ML IV SOLN: 1.0000 mg | INTRAVENOUS | Status: DC | PRN

## 2018-09-27 MED ORDER — DEXAMETHASONE SODIUM PHOSPHATE 10 MG/ML IJ SOLN
INTRAMUSCULAR | Status: AC
  Filled 2018-09-27: qty 1

## 2018-09-27 MED ORDER — SUGAMMADEX SODIUM 200 MG/2ML IV SOLN
INTRAVENOUS | Status: DC | PRN
  Administered 2018-09-27: 200 mg via INTRAVENOUS

## 2018-09-27 MED ORDER — PROPOFOL 10 MG/ML IV BOLUS: INTRAVENOUS | Status: DC | PRN

## 2018-09-27 MED ORDER — FENTANYL CITRATE (PF) 100 MCG/2ML IJ SOLN
50.0000 ug | INTRAMUSCULAR | Status: DC | PRN
  Administered 2018-09-27: 50 ug via INTRAVENOUS

## 2018-09-27 MED ORDER — MORPHINE SULFATE (PF) 2 MG/ML IV SOLN
1.0000 mg | INTRAVENOUS | Status: DC | PRN
  Administered 2018-09-27 – 2018-09-29 (×3): 1 mg via INTRAVENOUS
  Filled 2018-09-27 (×3): qty 1

## 2018-09-27 MED ORDER — LACTATED RINGERS IV SOLN
INTRAVENOUS | Status: DC
  Administered 2018-09-27: 13:00:00 via INTRAVENOUS

## 2018-09-27 MED ORDER — LIDOCAINE 2% (20 MG/ML) 5 ML SYRINGE
INTRAMUSCULAR | Status: DC | PRN
  Administered 2018-09-27: 100 mg via INTRAVENOUS

## 2018-09-27 MED ORDER — ASPIRIN 81 MG PO CHEW
81.0000 mg | CHEWABLE_TABLET | Freq: Two times a day (BID) | ORAL | Status: DC
  Administered 2018-09-29 – 2018-09-30 (×3): 81 mg via ORAL
  Filled 2018-09-27 (×3): qty 1

## 2018-09-27 MED ORDER — ONDANSETRON HCL 4 MG/2ML IJ SOLN: 4.0000 mg | Freq: Once | INTRAMUSCULAR | Status: DC | PRN

## 2018-09-27 MED ORDER — CELECOXIB 200 MG PO CAPS
200.0000 mg | ORAL_CAPSULE | Freq: Two times a day (BID) | ORAL | Status: DC
  Administered 2018-09-27 – 2018-09-30 (×6): 200 mg via ORAL
  Filled 2018-09-27 (×6): qty 1

## 2018-09-27 MED ORDER — DEXAMETHASONE SODIUM PHOSPHATE 10 MG/ML IJ SOLN
10.0000 mg | Freq: Once | INTRAMUSCULAR | Status: DC
  Filled 2018-09-27: qty 1

## 2018-09-27 MED ORDER — ONDANSETRON HCL 4 MG/2ML IJ SOLN
INTRAMUSCULAR | Status: AC
  Filled 2018-09-27: qty 2

## 2018-09-27 MED ORDER — BUPIVACAINE-EPINEPHRINE (PF) 0.25% -1:200000 IJ SOLN
INTRAMUSCULAR | Status: AC
  Filled 2018-09-27: qty 30

## 2018-09-27 MED ORDER — PHENYLEPHRINE 40 MCG/ML (10ML) SYRINGE FOR IV PUSH (FOR BLOOD PRESSURE SUPPORT)
PREFILLED_SYRINGE | INTRAVENOUS | Status: DC | PRN
  Administered 2018-09-27: 120 ug via INTRAVENOUS
  Administered 2018-09-27 (×2): 80 ug via INTRAVENOUS

## 2018-09-27 MED ORDER — ALUM & MAG HYDROXIDE-SIMETH 200-200-20 MG/5ML PO SUSP: 15.0000 mL | ORAL | Status: DC | PRN

## 2018-09-27 MED ORDER — METOCLOPRAMIDE HCL 5 MG PO TABS: 5.0000 mg | ORAL_TABLET | Freq: Three times a day (TID) | ORAL | Status: DC | PRN

## 2018-09-27 MED ORDER — DOCUSATE SODIUM 100 MG PO CAPS
100.0000 mg | ORAL_CAPSULE | Freq: Two times a day (BID) | ORAL | Status: DC
  Administered 2018-09-27 – 2018-09-28 (×2): 100 mg via ORAL
  Filled 2018-09-27 (×3): qty 1

## 2018-09-27 MED ORDER — 0.9 % SODIUM CHLORIDE (POUR BTL) OPTIME
TOPICAL | Status: DC | PRN
  Administered 2018-09-27: 1000 mL

## 2018-09-27 MED ORDER — FENTANYL CITRATE (PF) 100 MCG/2ML IJ SOLN: 25.0000 ug | INTRAMUSCULAR | Status: DC | PRN

## 2018-09-27 MED ORDER — FENTANYL CITRATE (PF) 250 MCG/5ML IJ SOLN
INTRAMUSCULAR | Status: AC
  Filled 2018-09-27: qty 5

## 2018-09-27 MED ORDER — ROCURONIUM BROMIDE 50 MG/5ML IV SOSY
PREFILLED_SYRINGE | INTRAVENOUS | Status: DC | PRN
  Administered 2018-09-27: 50 mg via INTRAVENOUS
  Administered 2018-09-27: 10 mg via INTRAVENOUS

## 2018-09-27 MED ORDER — SODIUM CHLORIDE (PF) 0.9 % IJ SOLN
INTRAMUSCULAR | Status: AC
  Filled 2018-09-27: qty 50

## 2018-09-27 MED ORDER — CEFAZOLIN SODIUM-DEXTROSE 2-4 GM/100ML-% IV SOLN
INTRAVENOUS | Status: AC
  Filled 2018-09-27: qty 100

## 2018-09-27 MED ORDER — MEPERIDINE HCL 50 MG/ML IJ SOLN: 6.2500 mg | INTRAMUSCULAR | Status: DC | PRN

## 2018-09-27 MED ORDER — KETOROLAC TROMETHAMINE 30 MG/ML IJ SOLN
INTRAMUSCULAR | Status: AC
  Filled 2018-09-27: qty 1

## 2018-09-27 MED ORDER — TRANEXAMIC ACID-NACL 1000-0.7 MG/100ML-% IV SOLN
INTRAVENOUS | Status: AC
  Filled 2018-09-27: qty 100

## 2018-09-27 MED ORDER — HYDROCODONE-ACETAMINOPHEN 5-325 MG PO TABS
1.0000 | ORAL_TABLET | ORAL | Status: DC | PRN
  Administered 2018-09-28: 1 via ORAL
  Filled 2018-09-27: qty 1

## 2018-09-27 MED ORDER — ONDANSETRON HCL 4 MG/2ML IJ SOLN
INTRAMUSCULAR | Status: DC | PRN
  Administered 2018-09-27: 4 mg via INTRAVENOUS

## 2018-09-27 MED ORDER — ALBUMIN HUMAN 5 % IV SOLN
INTRAVENOUS | Status: AC
  Filled 2018-09-27: qty 250

## 2018-09-27 MED ORDER — DIPHENHYDRAMINE HCL 12.5 MG/5ML PO ELIX
12.5000 mg | ORAL_SOLUTION | ORAL | Status: DC | PRN
  Administered 2018-09-29: 25 mg via ORAL
  Filled 2018-09-27: qty 10

## 2018-09-27 MED ORDER — ONDANSETRON HCL 4 MG PO TABS: 4.0000 mg | ORAL_TABLET | Freq: Four times a day (QID) | ORAL | Status: DC | PRN

## 2018-09-27 MED ORDER — CEFAZOLIN SODIUM-DEXTROSE 2-4 GM/100ML-% IV SOLN
2.0000 g | Freq: Once | INTRAVENOUS | Status: AC
  Administered 2018-09-27: 2 g via INTRAVENOUS

## 2018-09-27 MED ORDER — PROPOFOL 10 MG/ML IV BOLUS
INTRAVENOUS | Status: DC | PRN
  Administered 2018-09-27: 120 mg via INTRAVENOUS

## 2018-09-27 MED ORDER — SODIUM CHLORIDE (PF) 0.9 % IJ SOLN
INTRAMUSCULAR | Status: DC | PRN
  Administered 2018-09-27: 30 mL

## 2018-09-27 MED ORDER — TRANEXAMIC ACID-NACL 1000-0.7 MG/100ML-% IV SOLN
1000.0000 mg | INTRAVENOUS | Status: AC
  Administered 2018-09-27: 1000 mg via INTRAVENOUS
  Filled 2018-09-27: qty 100

## 2018-09-27 MED ORDER — VANCOMYCIN HCL 1000 MG IV SOLR
INTRAVENOUS | Status: DC | PRN
  Administered 2018-09-27: 3 g

## 2018-09-27 MED ORDER — VANCOMYCIN HCL 1000 MG IV SOLR
INTRAVENOUS | Status: AC
  Filled 2018-09-27: qty 3000

## 2018-09-27 MED ORDER — METHOCARBAMOL 500 MG IVPB - SIMPLE MED
INTRAVENOUS | Status: AC
  Filled 2018-09-27: qty 50

## 2018-09-27 MED ORDER — METOCLOPRAMIDE HCL 5 MG/ML IJ SOLN: 5.0000 mg | Freq: Three times a day (TID) | INTRAMUSCULAR | Status: DC | PRN

## 2018-09-27 MED ORDER — PHENYLEPHRINE 40 MCG/ML (10ML) SYRINGE FOR IV PUSH (FOR BLOOD PRESSURE SUPPORT)
PREFILLED_SYRINGE | INTRAVENOUS | Status: AC
  Filled 2018-09-27: qty 10

## 2018-09-27 MED ORDER — MAGNESIUM CITRATE PO SOLN: 1.0000 | Freq: Once | ORAL | Status: DC | PRN

## 2018-09-27 MED ORDER — ROCURONIUM BROMIDE 10 MG/ML (PF) SYRINGE
PREFILLED_SYRINGE | INTRAVENOUS | Status: AC
  Filled 2018-09-27: qty 10

## 2018-09-27 MED ORDER — MIDAZOLAM HCL 2 MG/2ML IJ SOLN
INTRAMUSCULAR | Status: AC
  Filled 2018-09-27: qty 2

## 2018-09-27 MED ORDER — FENTANYL CITRATE (PF) 100 MCG/2ML IJ SOLN
INTRAMUSCULAR | Status: AC
  Administered 2018-09-27: 50 ug via INTRAVENOUS
  Filled 2018-09-27: qty 2

## 2018-09-27 MED ORDER — ALBUMIN HUMAN 5 % IV SOLN
INTRAVENOUS | Status: DC | PRN
  Administered 2018-09-27: 16:00:00 via INTRAVENOUS

## 2018-09-27 MED ORDER — BISACODYL 10 MG RE SUPP: 10.0000 mg | Freq: Every day | RECTAL | Status: DC | PRN

## 2018-09-27 MED ORDER — BUPIVACAINE-EPINEPHRINE 0.25% -1:200000 IJ SOLN
INTRAMUSCULAR | Status: DC | PRN
  Administered 2018-09-27: 30 mL

## 2018-09-27 MED ORDER — MENTHOL 3 MG MT LOZG: 1.0000 | LOZENGE | OROMUCOSAL | Status: DC | PRN

## 2018-09-27 MED ORDER — SODIUM CHLORIDE 0.9 % IV SOLN
INTRAVENOUS | Status: DC | PRN
  Administered 2018-09-27: 18:00:00 via INTRAVENOUS

## 2018-09-27 MED ORDER — OXYCODONE HCL 5 MG PO TABS: 5.0000 mg | ORAL_TABLET | Freq: Once | ORAL | Status: DC | PRN

## 2018-09-27 MED ORDER — LIDOCAINE 2% (20 MG/ML) 5 ML SYRINGE
INTRAMUSCULAR | Status: AC
  Filled 2018-09-27: qty 5

## 2018-09-27 MED ORDER — DEXAMETHASONE SODIUM PHOSPHATE 10 MG/ML IJ SOLN
INTRAMUSCULAR | Status: DC | PRN
  Administered 2018-09-27: 8 mg via INTRAVENOUS

## 2018-09-27 MED ORDER — SODIUM CHLORIDE 0.9% IV SOLUTION: Freq: Once | INTRAVENOUS | Status: DC

## 2018-09-27 MED ORDER — PHENOL 1.4 % MT LIQD
1.0000 | OROMUCOSAL | Status: DC | PRN
  Filled 2018-09-27: qty 177

## 2018-09-27 MED ORDER — TRANEXAMIC ACID-NACL 1000-0.7 MG/100ML-% IV SOLN
1000.0000 mg | Freq: Once | INTRAVENOUS | Status: AC
  Administered 2018-09-27: 1000 mg via INTRAVENOUS
  Filled 2018-09-27: qty 100

## 2018-09-27 MED ORDER — FENTANYL CITRATE (PF) 100 MCG/2ML IJ SOLN
INTRAMUSCULAR | Status: DC | PRN
  Administered 2018-09-27 (×4): 50 ug via INTRAVENOUS

## 2018-09-27 MED ORDER — SODIUM CHLORIDE 0.9 % IR SOLN
Status: DC | PRN
  Administered 2018-09-27: 3000 mL

## 2018-09-27 MED ORDER — STERILE WATER FOR IRRIGATION IR SOLN
Status: DC | PRN
  Administered 2018-09-27: 2000 mL

## 2018-09-27 MED ORDER — KETOROLAC TROMETHAMINE 30 MG/ML IJ SOLN
INTRAMUSCULAR | Status: DC | PRN
  Administered 2018-09-27: 30 mg via INTRA_ARTICULAR

## 2018-09-27 MED ORDER — ONDANSETRON HCL 4 MG/2ML IJ SOLN: 4.0000 mg | Freq: Four times a day (QID) | INTRAMUSCULAR | Status: DC | PRN

## 2018-09-27 MED ORDER — FENTANYL CITRATE (PF) 100 MCG/2ML IJ SOLN
50.0000 ug | INTRAMUSCULAR | Status: DC
  Administered 2018-09-27: 15:00:00 50 ug via INTRAVENOUS
  Filled 2018-09-27: qty 2

## 2018-09-27 MED ORDER — SODIUM CHLORIDE 0.9 % IV SOLN: INTRAVENOUS | Status: DC

## 2018-09-27 MED ORDER — PROPOFOL 10 MG/ML IV BOLUS
INTRAVENOUS | Status: AC
  Filled 2018-09-27: qty 20

## 2018-09-27 MED ORDER — POLYETHYLENE GLYCOL 3350 17 G PO PACK: 17.0000 g | PACK | Freq: Two times a day (BID) | ORAL | Status: DC

## 2018-09-27 SURGICAL SUPPLY — 74 items
BAG DECANTER FOR FLEXI CONT (MISCELLANEOUS) IMPLANT
BAG ZIPLOCK 12X15 (MISCELLANEOUS) IMPLANT
BANDAGE ACE 6X5 VEL STRL LF (GAUZE/BANDAGES/DRESSINGS) ×2 IMPLANT
BLADE SAW SGTL 11.0X1.19X90.0M (BLADE) IMPLANT
BLADE SAW SGTL 13.0X1.19X90.0M (BLADE) ×2 IMPLANT
BLADE SAW SGTL 81X20 HD (BLADE) ×2 IMPLANT
BLADE SURG SZ10 CARB STEEL (BLADE) ×4 IMPLANT
BNDG GAUZE ELAST 4 BULKY (GAUZE/BANDAGES/DRESSINGS) ×1 IMPLANT
BRUSH FEMORAL CANAL (MISCELLANEOUS) IMPLANT
CEMENT HV SMART SET (Cement) ×3 IMPLANT
CEMENT RESTRICTOR DEPUY SZ 4 (Cement) ×2 IMPLANT
COVER SURGICAL LIGHT HANDLE (MISCELLANEOUS) ×2 IMPLANT
COVER WAND RF STERILE (DRAPES) IMPLANT
CUFF TOURN SGL QUICK 34 (TOURNIQUET CUFF) ×1
CUFF TRNQT CYL 34X4.125X (TOURNIQUET CUFF) ×1 IMPLANT
DECANTER SPIKE VIAL GLASS SM (MISCELLANEOUS) IMPLANT
DERMABOND ADVANCED (GAUZE/BANDAGES/DRESSINGS) ×1
DERMABOND ADVANCED .7 DNX12 (GAUZE/BANDAGES/DRESSINGS) ×1 IMPLANT
DRAPE U-SHAPE 47X51 STRL (DRAPES) ×2 IMPLANT
DRESSING AQUACEL AG SP 3.5X10 (GAUZE/BANDAGES/DRESSINGS) IMPLANT
DRSG AQUACEL AG ADV 3.5X10 (GAUZE/BANDAGES/DRESSINGS) ×1 IMPLANT
DRSG AQUACEL AG ADV 3.5X14 (GAUZE/BANDAGES/DRESSINGS) ×1 IMPLANT
DRSG AQUACEL AG SP 3.5X10 (GAUZE/BANDAGES/DRESSINGS)
DURAPREP 26ML APPLICATOR (WOUND CARE) ×4 IMPLANT
ELECT REM PT RETURN 15FT ADLT (MISCELLANEOUS) ×2 IMPLANT
EVACUATOR 1/8 PVC DRAIN (DRAIN) ×1 IMPLANT
FEMUR  HINGE RT X SMALL (Orthopedic Implant) ×1 IMPLANT
FEMUR HINGE RT X SMALL (Orthopedic Implant) ×1 IMPLANT
FEMUR HINGED RT X SMALL (Orthopedic Implant) IMPLANT
GAUZE SPONGE 2X2 8PLY STRL LF (GAUZE/BANDAGES/DRESSINGS) IMPLANT
GLOVE BIOGEL PI IND STRL 7.5 (GLOVE) ×1 IMPLANT
GLOVE BIOGEL PI IND STRL 8.5 (GLOVE) ×1 IMPLANT
GLOVE BIOGEL PI INDICATOR 7.5 (GLOVE) ×1
GLOVE BIOGEL PI INDICATOR 8.5 (GLOVE) ×1
GLOVE ECLIPSE 8.0 STRL XLNG CF (GLOVE) ×4 IMPLANT
GLOVE ORTHO TXT STRL SZ7.5 (GLOVE) ×2 IMPLANT
GOWN STRL REUS W/TWL 2XL LVL3 (GOWN DISPOSABLE) ×2 IMPLANT
GOWN STRL REUS W/TWL LRG LVL3 (GOWN DISPOSABLE) ×2 IMPLANT
GOWN STRL REUS W/TWL XL LVL3 (GOWN DISPOSABLE) IMPLANT
HANDPIECE INTERPULSE COAX TIP (DISPOSABLE) ×1
HOLDER FOLEY CATH W/STRAP (MISCELLANEOUS) IMPLANT
IMMOBILIZER KNEE 20 (SOFTGOODS) ×2 IMPLANT
IMMOBILIZER KNEE 20 THIGH 36 (SOFTGOODS) IMPLANT
KIT TURNOVER KIT A (KITS) IMPLANT
MANIFOLD NEPTUNE II (INSTRUMENTS) ×2 IMPLANT
NDL SAFETY ECLIPSE 18X1.5 (NEEDLE) IMPLANT
NEEDLE HYPO 18GX1.5 SHARP (NEEDLE)
NS IRRIG 1000ML POUR BTL (IV SOLUTION) ×2 IMPLANT
PACK TOTAL KNEE CUSTOM (KITS) ×2 IMPLANT
PLATE TIB LPS HNGD UNV XS14 KN (Insert) IMPLANT
PROTECTOR NERVE ULNAR (MISCELLANEOUS) ×2 IMPLANT
SET HNDPC FAN SPRY TIP SCT (DISPOSABLE) ×1 IMPLANT
SET PAD KNEE POSITIONER (MISCELLANEOUS) ×2 IMPLANT
SLEEVE FEM UNIV FULL PRO SZ34 (Sleeve) ×1 IMPLANT
SPONGE GAUZE 2X2 STER 10/PKG (GAUZE/BANDAGES/DRESSINGS)
STAPLER VISISTAT 35W (STAPLE) IMPLANT
STEM TIBIA PFC 13X30MM (Stem) ×1 IMPLANT
STEM UNIVERSAL REVISION 75X12 (Stem) ×1 IMPLANT
SUT MNCRL AB 3-0 PS2 18 (SUTURE) ×2 IMPLANT
SUT STRATAFIX PDS+ 0 24IN (SUTURE) ×3 IMPLANT
SUT VIC AB 1 CT1 36 (SUTURE) ×5 IMPLANT
SUT VIC AB 2-0 CT1 27 (SUTURE) ×4
SUT VIC AB 2-0 CT1 TAPERPNT 27 (SUTURE) ×3 IMPLANT
SYR 50ML LL SCALE MARK (SYRINGE) IMPLANT
TIBIA LPS HINGED UNV XS14 KNEE (Insert) ×2 IMPLANT
TOWER CARTRIDGE SMART MIX (DISPOSABLE) ×2 IMPLANT
TRAY FOLEY MTR SLVR 16FR STAT (SET/KITS/TRAYS/PACK) ×2 IMPLANT
TRAY REVISION SZ 2.5 (Knees) ×1 IMPLANT
TRAY SLEEVE CEM ML (Knees) ×1 IMPLANT
TUBE KAMVAC SUCTION (TUBING) IMPLANT
WATER STERILE IRR 1000ML POUR (IV SOLUTION) ×2 IMPLANT
WEDGE STEP SZ.5 5MM (Knees) ×2 IMPLANT
WRAP KNEE MAXI GEL POST OP (GAUZE/BANDAGES/DRESSINGS) ×2 IMPLANT
YANKAUER SUCT BULB TIP 10FT TU (MISCELLANEOUS) IMPLANT

## 2018-09-27 NOTE — Progress Notes (Signed)
PROGRESS NOTE    Brittany Russo  VQQ:[id number] DOB: Nov 13, 1945 DOA: 09/24/2018 PCP: Rosalee Kaufman, PA-C   Brief Narrative: 73 year old with past medical history significant for depression, anxiety, cirrhosis of the liver nonalcoholic, depression, endometrial cancer, history of hypertension, diabetes, history of seizure, history of vaginal cancer who presents to the hospital complaining of right knee pain and is swelling seen her recent right total knee arthroplasty.  She was discharged home following surgery when she was arriving at home she had 2 falls leaving the hospital and getting out of her car.  She presented back to the emergency department and was found to have a periprosthetic femur fracture with subluxation of total right knee arthroplasty. Patient was supposed to go to the OR the morning of 6 July 5 but she was found to be very encephalopathic and somnolent, code stroke was called and telemetry neurology evaluated patient. [facility] was consulted to help with medical management.    Assessment & Plan:   Active Problems:   Periprosthetic fracture around internal prosthetic right knee joint    1-acute hepatic encephalopathy: Patient was somnolent, right hand weakness and tremors. Ammonia was elevated. Code stroke was activated on 7/5.  CT head was negative for acute intracranial bleed. Tele-neurology was consulted, Dr. Graylon Good did not recommend thrombolytic therapy or emergent vascular imaging.  Patient presentation likely related to hepatic encephalopathy. -EEG awaiting results - lactulose has been on hold due to multiple bowel movement on 7/6 and need for surgery.   -Need to resume lactulose postop. -Patient is alert and oriented.  Confusion resolved -Ammonia level trending down, at 40   Cirrhosis: Nonalcoholic Needs to resume lactulose postop Abdominal ultrasound: Mild ascites Mild elevation of AST, bilirubin 1.3.  Anemia: Check  panel in the morning. Monitor  hemoglobin postop closely.  Hyponatremia, hypochloremia Agree with holding hydrochlorothiazide Continue with IV fluids.  We will hold irbesartan  Improved today.  Thrombocytopenia: Related to cirrhosis.  Stable.  Normocytic anemia: Continue with ferrous sulfate. Follow anemia panel Hemoglobin stable  Diabetes Continue to hold linagliptin, metformin and glipizide Sliding scale insulin Continue with Lantus  Hypertension: Continue with diltiazem,  atenolol. Continue per [place] due to hyponatremia.  Depression, anxiety Continue with citalopram  Obesity   Estimated body mass index is 38.35 kg/m as calculated from the following:   Height as of this encounter: 5' 2"  (1.575 m).   Weight as of this encounter: 95.1 kg.   DVT prophylaxis: Per Ortho Code Status: Full code Family Communication: Per Ortho Disposition Plan: For surgery today    Procedures:     Antimicrobials:    Subjective: She is alert and oriented x3.  She is now some suprapubic pressure.  The nurse will evaluate Foley catheter placement.  Significant bowel movement today.  Objective: Vitals:   09/26/18 0508 09/26/18 1019 09/26/18 2234 09/27/18 0450  BP: (!) 142/51 (!) 155/68 (!) 146/61 (!) 145/59  Pulse: 66 79 77 72  Resp: 16 16  18   Temp: 98.8 F (37.1 C) 98.1 F (36.7 C) 98 F (36.7 C) 98 F (36.7 C)  TempSrc: Oral Oral Oral Oral  SpO2: 98% 97% 100% 99%  Weight:      Height:        Intake/Output Summary (Last 24 hours) at 09/27/2018 0720 Last data filed at 09/27/2018 0600 Gross per 24 hour  Intake 568.81 ml  Output 3100 ml  Net -2531.19 ml   Filed Weights   09/24/18 0450  Weight: 95.1 kg  Examination:  General exam: Not acute distress Respiratory system: Clear auscultation Cardiovascular system: 1, S2 regular rhythm and rate Gastrointestinal system: Bowel sounds present, soft nontender nondistended Central nervous system: Alert and oriented nonfocal Extremities: Right knee  with in immobilizer Skin: No rashes Psychiatry: Mood and affect appropriate    Data Reviewed: I have personally reviewed following labs and imaging studies  CBC: Recent Labs  Lab 09/23/18 0259 09/24/18 0308 09/25/18 1119 09/26/18 0818 09/27/18 0317  WBC 5.1 13.4* 7.9 7.6 4.8  NEUTROABS  --  10.4*  --  5.5  --   HGB 9.0* 8.6* 8.2* 8.0* 8.1*  HCT 27.8* 26.4* 24.2* 25.2* 24.3*  MCV 89.1 88.6 87.7 89.4 88.4  PLT 58* 120* 85* 94* 71*   Basic Metabolic Panel: Recent Labs  Lab 09/23/18 0259 09/24/18 0308 09/25/18 1119 09/26/18 0818 09/27/18 0317  NA 135 129* 126* 127* 130*  K 4.1 4.1 3.8 3.8 3.5  CL 104 98 93* 94* 97*  CO2 25 22 22  21* 26  GLUCOSE 273* 267* 195* 225* 134*  BUN 15 24* 33* 24* 16  CREATININE 0.70 0.83 0.94 0.73 0.44  CALCIUM 8.4* 8.7* 8.2* 7.9* 7.8*  MG  --   --   --  2.0  --   PHOS  --   --   --  2.7  --    GFR: Estimated Creatinine Clearance: 70.3 mL/min (by C-G formula based on SCr of 0.44 mg/dL). Liver Function Tests: Recent Labs  Lab 09/25/18 1119 09/26/18 0818  AST 37 50*  ALT 22 26  ALKPHOS 140* 136*  BILITOT 1.2 1.3*  PROT 5.7* 5.4*  ALBUMIN 2.9* 2.6*   No results for input(s): LIPASE, AMYLASE in the last 168 hours. Recent Labs  Lab 09/25/18 1119 09/26/18 0814  AMMONIA 66* 41*   Coagulation Profile: No results for input(s): INR, PROTIME in the last 168 hours. Cardiac Enzymes: No results for input(s): CKTOTAL, CKMB, CKMBINDEX, TROPONINI in the last 168 hours. BNP (last 3 results) No results for input(s): PROBNP in the last 8760 hours. HbA1C: No results for input(s): HGBA1C in the last 72 hours. CBG: Recent Labs  Lab 09/25/18 2035 09/26/18 0725 09/26/18 1232 09/26/18 1720 09/26/18 2232  GLUCAP 159* 221* 187* 204* 182*   Lipid Profile: No results for input(s): CHOL, HDL, LDLCALC, TRIG, CHOLHDL, LDLDIRECT in the last 72 hours. Thyroid Function Tests: Recent Labs    09/25/18 1119  TSH 3.151   Anemia Panel: No results  for input(s): VITAMINB12, FOLATE, FERRITIN, TIBC, IRON, RETICCTPCT in the last 72 hours. Sepsis Labs: No results for input(s): PROCALCITON, LATICACIDVEN in the last 168 hours.  Recent Results (from the past 240 hour(s))  Surgical pcr screen     Status: None   Collection Time: 09/19/18  1:34 PM   Specimen: Nasal Mucosa; Nasal Swab  Result Value Ref Range Status   MRSA, PCR NEGATIVE NEGATIVE Final   Staphylococcus aureus NEGATIVE NEGATIVE Final    Comment: (NOTE) The Xpert SA Assay (FDA approved for NASAL specimens in patients 10 years of age and older), is one component of a comprehensive surveillance program. It is not intended to diagnose infection nor to guide or monitor treatment. Performed at [facility], [place] [address] [place],  [address]   SARS Coronavirus 2 (Performed in [place] hospital lab)     Status: None   Collection Time: 09/19/18  2:58 PM   Specimen: Nasal Swab  Result Value Ref Range Status   SARS Coronavirus  2 NEGATIVE NEGATIVE Final    Comment: (NOTE) SARS-CoV-2 target nucleic acids are NOT DETECTED. The SARS-CoV-2 RNA is generally detectable in upper and lower respiratory specimens during the acute phase of infection. Negative results do not preclude SARS-CoV-2 infection, do not rule out co-infections with other pathogens, and should not be used as the sole basis for treatment or other patient management decisions. Negative results must be combined with clinical observations, patient history, and epidemiological information. The expected result is Negative. Fact Sheet for Patients: SugarRoll.be Fact Sheet for Healthcare Providers: https://www.woods-mathews.com/ This test is not yet approved or cleared by the [place] FDA and  has been authorized for detection and/or diagnosis of SARS-CoV-2 by FDA under an Emergency Use Authorization (EUA). This EUA will remain  in effect (meaning  this test can be used) for the duration of the COVID-19 declaration under Section 56 4(b)(1) of the Act, 21 U.S.C. section 360bbb-3(b)(1), unless the authorization is terminated or revoked sooner. Performed at [facility], [place] [address], [place], [place] [address]   Urine Culture     Status: Abnormal   Collection Time: 09/22/18  7:18 AM   Specimen: Urine, Clean Catch  Result Value Ref Range Status   Specimen Description   Final    URINE, CLEAN CATCH Performed at [facility], [place] [address] [place], [place] [address]    Special Requests   Final    NONE Performed at [facility], [place] [address] [place], [place] [address]    Culture (A)  Final    <10,000 COLONIES/mL INSIGNIFICANT GROWTH Performed at [place] [address] [place], [place] [address]    Report Status 09/23/2018 FINAL  Final  SARS Coronavirus 2 (CEPHEID - Performed in [place] hospital lab), Hosp Order     Status: None   Collection Time: 09/24/18  8:13 AM   Specimen: Nasopharyngeal Swab  Result Value Ref Range Status   SARS Coronavirus 2 NEGATIVE NEGATIVE Final    Comment: (NOTE) If result is NEGATIVE SARS-CoV-2 target nucleic acids are NOT DETECTED. The SARS-CoV-2 RNA is generally detectable in upper and lower  respiratory specimens during the acute phase of infection. The lowest  concentration of SARS-CoV-2 viral copies this assay can detect is 250  copies / mL. A negative result does not preclude SARS-CoV-2 infection  and should not be used as the sole basis for treatment or other  patient management decisions.  A negative result may occur with  improper specimen collection / handling, submission of specimen other  than nasopharyngeal swab, presence of viral mutation(s) within the  areas targeted by this assay, and inadequate number of viral copies  (<250 copies / mL). A negative result must be combined with clinical  observations, patient  history, and epidemiological information. If result is POSITIVE SARS-CoV-2 target nucleic acids are DETECTED. The SARS-CoV-2 RNA is generally detectable in upper and lower  respiratory specimens dur ing the acute phase of infection.  Positive  results are indicative of active infection with SARS-CoV-2.  Clinical  correlation with patient history and other diagnostic information is  necessary to determine patient infection status.  Positive results do  not rule out bacterial infection or co-infection with other viruses. If result is PRESUMPTIVE POSTIVE SARS-CoV-2 nucleic acids MAY BE PRESENT.   A presumptive positive result was obtained on the submitted specimen  and confirmed on repeat testing.  While 2019 novel coronavirus  (SARS-CoV-2) nucleic acids may be present in the  submitted sample  additional confirmatory testing may be necessary for epidemiological  and / or clinical management purposes  to differentiate between  SARS-CoV-2 and other Sarbecovirus currently known to infect humans.  If clinically indicated additional testing with an alternate test  methodology [phone]) is advised. The SARS-CoV-2 RNA is generally  detectable in upper and lower respiratory sp ecimens during the acute  phase of infection. The expected result is Negative. Fact Sheet for Patients:  StrictlyIdeas.no Fact Sheet for Healthcare Providers: BankingDealers.co.za This test is not yet approved or cleared by the [place] FDA and has been authorized for detection and/or diagnosis of SARS-CoV-2 by FDA under an Emergency Use Authorization (EUA).  This EUA will remain in effect (meaning this test can be used) for the duration of the COVID-19 declaration under Section 564(b)(1) of the Act, 21 U.S.C. section 360bbb-3(b)(1), unless the authorization is terminated or revoked sooner. Performed at [facility], [place] [address] [place],  [place] [address]   Surgical pcr screen     Status: None   Collection Time: 09/26/18 12:10 AM   Specimen: Nasal Mucosa; Nasal Swab  Result Value Ref Range Status   MRSA, PCR NEGATIVE NEGATIVE Final   Staphylococcus aureus NEGATIVE NEGATIVE Final    Comment: (NOTE) The Xpert SA Assay (FDA approved for NASAL specimens in patients 36 years of age and older), is one component of a comprehensive surveillance program. It is not intended to diagnose infection nor to guide or monitor treatment. Performed at [facility], [place] [address], [place], [place] [address]          Radiology Studies: US Abdomen Complete  Result Date: 09/26/2018 CLINICAL DATA:  Cirrhosis. EXAM: ABDOMEN ULTRASOUND COMPLETE COMPARISON:  Abdominal ultrasound 04/19/2015. MRCP 04/26/2015 and CT abdomen 04/07/2017. FINDINGS: Gallbladder: Mild gallbladder wall thickening without sonographic Murphy's sign. No evidence of gallstones. Common bile duct: Diameter: 6 mm Liver: There are morphologic changes of cirrhosis with heterogeneity of the hepatic parenchyma. No focal lesions are identified. Portal vein is patent on color Doppler imaging with normal direction of blood flow towards the liver. IVC: No abnormality visualized. Pancreas: Visualized portion unremarkable. Spleen: Size and appearance within normal limits. Right Kidney: Length: 9.5 cm. Echogenicity within normal limits. No mass or hydronephrosis visualized. Left Kidney: Length: 10.5 cm. Echogenicity within normal limits. No mass or hydronephrosis visualized. Abdominal aorta: No aneurysm visualized. Portions of the aorta are obscured by bowel gas. Other findings: A small amount of ascites is present in the right and left upper quadrants of the abdomen. IMPRESSION: 1. Morphologic changes of cirrhosis without acute or focal abnormality identified. 2. Mild nonspecific gallbladder wall thickening, likely related to underlying liver disease. 3. Mild ascites. Electronically  Signed   By: Richardean Sale M.D.   On: 09/26/2018 09:32   Ct Head Code Stroke Wo Contrast  Result Date: 09/25/2018 CLINICAL DATA:  Code stroke. Slurred speech. Tremors. Altered mental status. Week graft right. EXAM: CT HEAD WITHOUT CONTRAST TECHNIQUE: Contiguous axial images were obtained from the base of the skull through the vertex without intravenous contrast. COMPARISON:  06/08/2015 FINDINGS: Brain: Mild age related volume. No evidence of old or acute focal infarction, mass lesion, hydrocephalus or extra-axial collection. Vascular: No abnormal vascular finding Skull: Normal Sinuses/Orbits: Inflammatory changes of the paranasal sinuses affecting sinus Other: None ASPECTS ([place] Stroke Program Early CT Score) - Ganglionic level infarction (caudate, lentiform nuclei, internal capsule, insula, M1-M3 cortex): 7 - Supraganglionic infarction (M4-M6 cortex): 3 Total score (0-10 with 10  being normal): 10 IMPRESSION: 1. Normal head CT for age. 2. Paranasal sinusitis worse in the right maxillary sinus. 3. ASPECTS is 10. 4. I spoke to the patient's nurse at 1050 hours, but we are unable to reach a covering physician but will keep trying. Electronically Signed   By: Nelson Chimes M.D.   On: 09/25/2018 10:55        Scheduled Meds:  ALPRAZolam  0.25 mg Oral BID   atenolol  50 mg Oral Daily   cefUROXime  250 mg Oral BID   citalopram  40 mg Oral Daily   diltiazem  180 mg Oral Daily   feeding supplement  296 mL Oral Once   ferrous sulfate  325 mg Oral BID WC   fluticasone  1 spray Each Nare Daily   insulin aspart  0-15 Units Subcutaneous TID WC   insulin aspart  0-5 Units Subcutaneous QHS   insulin glargine  65 Units Subcutaneous Daily   lactulose  20 g Oral TID   loratadine  10 mg Oral Daily   montelukast  10 mg Oral QHS   naproxen  250 mg Oral BID WC   potassium chloride  10 mEq Oral Daily   povidone-iodine  2 application Topical Once   simvastatin  40 mg Oral QHS   Continuous  Infusions:  sodium chloride 75 mL/hr at 09/27/18 0010   methocarbamol (ROBAXIN) IV       LOS: 3 days    Time spent: 35 minutes     Elmarie Shiley, MD [facility] Pager [phone]  If 7PM-7AM, please contact night-coverage [email or web address] Password [email or web address] 09/27/2018, 7:20 AM

## 2018-09-27 NOTE — Interval H&P Note (Signed)
History and Physical Interval Note:  09/27/2018 2:03 PM  Brittany Russo  has presented today for surgery, with the diagnosis of right total knee failure.  The various methods of treatment have been discussed with the patient and family. After consideration of risks, benefits and other options for treatment, the patient has consented to  Procedure(s): TOTAL KNEE REVISION (Right) as a surgical intervention.  The patient's history has been reviewed, patient examined, no change in status, stable for surgery.  I have reviewed the patient's chart and labs.  Questions were answered to the patient's satisfaction.     [name]

## 2018-09-27 NOTE — Progress Notes (Signed)
Patient arrived to short stay very tearful c/o 9/10 right leg pain.  Dr. Lissa Hoard notified with orders given for pain medication.    Patient asked for a phone to call her husband.  Patient called home number with no answer and could not remember husbands cell phone number.  Patient became very tearful stating she feels "like this is a conspiracy"  to let her die or not allow her to come home.  Patient unable to explain why she feels this way.  RN encouraged patient and offered support.

## 2018-09-27 NOTE — Progress Notes (Signed)
Patient called RN into room. Rn came to room and patient requested pain medication.   RN informed patient that I had given her IV morhone, 1mg  at 1123. Patient concerned about her IV, and asked "does that bag have pain medication in it?"   RN informed patient that she could not have any more pain medication at this time because she had some confusion related to the pain medications. Patient states "I don't remember any of that"  I informed the patient that I would call her husband, and let him know what was going on.   Patient stated she was about to call the ambulance and get them to take her home. I asked the patient not to do this because it wouldn't help her. I also told her that she was on the surgery schedule this afternoon with Dr. Alvan Dame.   Patient started to cry. I applied ice to painful sites, offered to reposition the patients leg, of which she refused. I offered music and television to help distract the patient, and she refused both.   Will continue to monitor patient and communicate patient needs to MD.

## 2018-09-27 NOTE — Progress Notes (Signed)
Patient ID: Brittany Russo, female   DOB: 1945-12-18, 73 y.o.   MRN: [id number] Subjective: Surgery cancelled yesterday due to lactulose induce bowel movements.  Hoping today is day to proceed  Patient reports pain as moderate.  Objective:   VITALS:   Vitals:   09/26/18 2234 09/27/18 0450  BP: (!) 146/61 (!) 145/59  Pulse: 77 72  Resp:  18  Temp: 98 F (36.7 C) 98 F (36.7 C)  SpO2: 100% 99%    Awake alert RLE exam unchanged  LABS Recent Labs    09/25/18 1119 09/26/18 0818 09/27/18 0317  HGB 8.2* 8.0* 8.1*  HCT 24.2* 25.2* 24.3*  WBC 7.9 7.6 4.8  PLT 85* 94* 71*    Recent Labs    09/25/18 1119 09/26/18 0818 09/27/18 0317  NA 126* 127* 130*  K 3.8 3.8 3.5  BUN 33* 24* 16  CREATININE 0.94 0.73 0.44  GLUCOSE 195* 225* 134*    No results for input(s): LABPT, INR in the last 72 hours.   Assessment/Plan: Right distal periprosthetic femur fracture Significant acute on chronic medical comorbidities Anemia - follow Hgb post op for need transfusion, TXA intra-op   Plan: If deemed stable and cleared for surgery today we will proceed with revision of her right knee to hinged prosthesis NPO

## 2018-09-27 NOTE — H&P (View-Only) (Signed)
Patient ID: Brittany Russo, female   DOB: 05-29-1945, 73 y.o.   MRN: [id number] Subjective: Surgery cancelled yesterday due to lactulose induce bowel movements.  Hoping today is day to proceed  Patient reports pain as moderate.  Objective:   VITALS:   Vitals:   09/26/18 2234 09/27/18 0450  BP: (!) 146/61 (!) 145/59  Pulse: 77 72  Resp:  18  Temp: 98 F (36.7 C) 98 F (36.7 C)  SpO2: 100% 99%    Awake alert RLE exam unchanged  LABS Recent Labs    09/25/18 1119 09/26/18 0818 09/27/18 0317  HGB 8.2* 8.0* 8.1*  HCT 24.2* 25.2* 24.3*  WBC 7.9 7.6 4.8  PLT 85* 94* 71*    Recent Labs    09/25/18 1119 09/26/18 0818 09/27/18 0317  NA 126* 127* 130*  K 3.8 3.8 3.5  BUN 33* 24* 16  CREATININE 0.94 0.73 0.44  GLUCOSE 195* 225* 134*    No results for input(s): LABPT, INR in the last 72 hours.   Assessment/Plan: Right distal periprosthetic femur fracture Significant acute on chronic medical comorbidities Anemia - follow Hgb post op for need transfusion, TXA intra-op   Plan: If deemed stable and cleared for surgery today we will proceed with revision of her right knee to hinged prosthesis NPO

## 2018-09-27 NOTE — Progress Notes (Signed)
Assisted Dr. Germeroth with right, ultrasound guided, adductor canal block. Side rails up, monitors on throughout procedure. See vital signs in flow sheet. Tolerated Procedure well. 

## 2018-09-27 NOTE — Anesthesia Procedure Notes (Addendum)
Anesthesia Regional Block: Adductor canal block   Pre-Anesthetic Checklist: ,, timeout performed, Correct Patient, Correct Site, Correct Laterality, Correct Procedure, Correct Position, site marked, Risks and benefits discussed,  Surgical consent,  Pre-op evaluation,  At surgeon's request and post-op pain management  Laterality: Right  Prep: chloraprep       Needles:  Injection technique: Single-shot  Needle Type: Stimiplex     Needle Length: 9cm  Needle Gauge: 21     Additional Needles:   Procedures:,,,, ultrasound used (permanent image in chart),,,,  Narrative:  Start time: 09/27/2018 2:30 PM End time: 09/27/2018 2:38 PM Injection made incrementally with aspirations every 5 mL.  Performed by: Personally  Anesthesiologist: Nolon Nations, MD  Additional Notes: BP cuff, EKG monitors applied. Sedation begun. Artery and nerve location verified with U/S and anesthetic injected incrementally, slowly, and after negative aspirations under direct u/s guidance. Good fascial /perineural spread. Tolerated well.

## 2018-09-27 NOTE — Anesthesia Postprocedure Evaluation (Signed)
Anesthesia Post Note  Patient: Brittany Russo  Procedure(s) Performed: TOTAL KNEE REVISION (Right )     Patient location during evaluation: PACU Anesthesia Type: General Level of consciousness: sedated and patient cooperative Pain management: pain level controlled Vital Signs Assessment: post-procedure vital signs reviewed and stable Respiratory status: spontaneous breathing Cardiovascular status: stable Anesthetic complications: no    Last Vitals:  Vitals:   09/27/18 1936 09/27/18 1945  BP:  128/69  Pulse: 89 85  Resp:  (!) 8  Temp:  37.2 C  SpO2: 100% 100%    Last Pain:  Vitals:   09/27/18 1945  TempSrc: Axillary  PainSc:                  Nolon Nations

## 2018-09-27 NOTE — Transfer of Care (Signed)
Immediate Anesthesia Transfer of Care Note  Patient: Brittany Russo  Procedure(s) Performed: TOTAL KNEE REVISION (Right )  Patient Location: PACU  Anesthesia Type:GA combined with regional for post-op pain  Level of Consciousness: awake, drowsy and patient cooperative  Airway & Oxygen Therapy: Patient Spontanous Breathing and Patient connected to face mask oxygen  Post-op Assessment: Report given to RN and Post -op Vital signs reviewed and stable  Post vital signs: Reviewed and stable  Last Vitals:  Vitals Value Taken Time  BP 123/70 09/27/18 1830  Temp    Pulse 89 09/27/18 1831  Resp 15 09/27/18 1831  SpO2 100 % 09/27/18 1831  Vitals shown include unvalidated device data.  Last Pain:  Vitals:   09/27/18 1826  TempSrc:   PainSc: (P) 0-No pain      Patients Stated Pain Goal: 6 (18/20/99 0689)  Complications: No apparent anesthesia complications

## 2018-09-27 NOTE — Progress Notes (Signed)
Orthopedic Tech Progress Note Patient Details:  Brittany Russo [birth year] [id number] Pt already has a Knee Immobilizer on.         Ladell Pier [facility] 09/27/2018, 11:17 PM

## 2018-09-27 NOTE — Anesthesia Preprocedure Evaluation (Addendum)
Anesthesia Evaluation  Patient identified by MRN, date of birth, ID band Patient awake    Reviewed: Allergy & Precautions, NPO status , Patient's Chart, lab work & pertinent test results, reviewed documented beta blocker date and time   Airway Mallampati: III  TM Distance: >3 FB Neck ROM: Full    Dental  (+) Teeth Intact, Dental Advisory Given   Pulmonary neg pulmonary ROS,    Pulmonary exam normal breath sounds clear to auscultation       Cardiovascular hypertension, Pt. on medications and Pt. on home beta blockers Normal cardiovascular exam Rhythm:Regular Rate:Normal     Neuro/Psych  Headaches, Seizures -, Well Controlled,  PSYCHIATRIC DISORDERS Anxiety Depression    GI/Hepatic negative GI ROS, (+) Cirrhosis       , NASH   Endo/Other  diabetes, Well Controlled, Type 2, Insulin DependentHyperlipidemia Obesity  Renal/GU negative Renal ROS     Musculoskeletal  (+) Arthritis , Osteoarthritis,  OA right knee   Abdominal   Peds  Hematology  (+) anemia , Thrombocytopenia- thought due to cirrhosis   Anesthesia Other Findings   Reproductive/Obstetrics Hx/o Endometrial Ca S/P RT 2005                             Anesthesia Physical  Anesthesia Plan  ASA: III  Anesthesia Plan: General   Post-op Pain Management: GA combined w/ Regional for post-op pain   Induction: Intravenous  PONV Risk Score and Plan: 3 and Ondansetron, Treatment may vary due to age or medical condition, Propofol infusion and Midazolam  Airway Management Planned: Oral ETT  Additional Equipment: None  Intra-op Plan:   Post-operative Plan: Extubation in OR  Informed Consent: I have reviewed the patients History and Physical, chart, labs and discussed the procedure including the risks, benefits and alternatives for the proposed anesthesia with the patient or authorized representative who has indicated his/her  understanding and acceptance.     Dental advisory given  Plan Discussed with: CRNA  Anesthesia Plan Comments: (See PAT note 09/19/2018, Konrad Felix, PA-C)      Anesthesia Quick Evaluation

## 2018-09-27 NOTE — Anesthesia Procedure Notes (Signed)

## 2018-09-27 NOTE — Brief Op Note (Signed)
09/24/2018 - 09/27/2018  5:57 PM  PATIENT:  Brittany Russo  73 y.o. female  PRE-OPERATIVE DIAGNOSIS:  Right closed comminuted distal periprosthetic fracture with failure of implant  POST-OPERATIVE DIAGNOSIS:  Right closed comminuted distal periprosthetic fracture with failure of implant  PROCEDURE:  Procedure(s): TOTAL KNEE REVISION (Right)  SURGEON:  Surgeon(s) and Role:    Paralee Cancel, MD - Primary  PHYSICIAN ASSISTANT: Griffith Citron, PA-C  ANESTHESIA:   regional and general  EBL:  300 mL   BLOOD ADMINISTERED:2 units of PRBCs  DRAINS: (1 medium) Hemovact drain(s) in the suprapatellar space with  Suction Open   LOCAL MEDICATIONS USED:  MARCAINE     SPECIMEN:  No Specimen  DISPOSITION OF SPECIMEN:  N/A  COUNTS:  YES  TOURNIQUET:   Total Tourniquet Time Documented: Thigh (Right) - 92 minutes Total: Thigh (Right) - 92 minutes   DICTATION: .Other Dictation: Dictation Number [id number]  PLAN OF CARE: Admit to inpatient   PATIENT DISPOSITION:  PACU - hemodynamically stable.   Delay start of Pharmacological VTE agent (>24hrs) due to surgical blood loss or risk of bleeding: yes

## 2018-09-27 NOTE — Care Management Important Message (Signed)
Important Message  Patient Details IM Letter given to Kathrin Greathouse SW to present to the Patient Name: Brittany Russo MRN: [id number] Date of Birth: [birth year]   Medicare Important Message Given:  Yes     Kerin Salen 09/27/2018, 11:02 AM

## 2018-09-28 ENCOUNTER — Encounter (HOSPITAL_COMMUNITY): Payer: Self-pay | Admitting: Orthopedic Surgery

## 2018-09-28 DIAGNOSIS — K7460 Unspecified cirrhosis of liver: Secondary | ICD-10-CM

## 2018-09-28 DIAGNOSIS — F329 Major depressive disorder, single episode, unspecified: Secondary | ICD-10-CM

## 2018-09-28 DIAGNOSIS — F32A Depression, unspecified: Secondary | ICD-10-CM | POA: Diagnosis present

## 2018-09-28 DIAGNOSIS — E119 Type 2 diabetes mellitus without complications: Secondary | ICD-10-CM

## 2018-09-28 DIAGNOSIS — K7200 Acute and subacute hepatic failure without coma: Secondary | ICD-10-CM | POA: Diagnosis present

## 2018-09-28 DIAGNOSIS — K7682 Hepatic encephalopathy: Secondary | ICD-10-CM | POA: Diagnosis present

## 2018-09-28 DIAGNOSIS — E785 Hyperlipidemia, unspecified: Secondary | ICD-10-CM

## 2018-09-28 DIAGNOSIS — I10 Essential (primary) hypertension: Secondary | ICD-10-CM

## 2018-09-28 LAB — GLUCOSE, CAPILLARY
Glucose-Capillary: 251 mg/dL — ABNORMAL HIGH (ref 70–99)
Glucose-Capillary: 280 mg/dL — ABNORMAL HIGH (ref 70–99)
Glucose-Capillary: 316 mg/dL — ABNORMAL HIGH (ref 70–99)
Glucose-Capillary: 174 mg/dL — ABNORMAL HIGH (ref 70–99)
Glucose-Capillary: 100 mg/dL — ABNORMAL HIGH (ref 70–99)

## 2018-09-28 LAB — BASIC METABOLIC PANEL
Anion gap: 8 (ref 5–15)
BUN: 25 mg/dL — ABNORMAL HIGH (ref 8–23)
CO2: 22 mmol/L (ref 22–32)
Calcium: 7.7 mg/dL — ABNORMAL LOW (ref 8.9–10.3)
Chloride: 102 mmol/L (ref 98–111)
Creatinine, Ser: 0.9 mg/dL (ref 0.44–1.00)
GFR calc Af Amer: >60 mL/min (ref >60)
GFR calc non Af Amer: >60 mL/min (ref >60)
Glucose, Bld: 270 mg/dL — ABNORMAL HIGH (ref 70–99)
Potassium: 4.7 mmol/L (ref 3.5–5.1)
Sodium: 132 mmol/L — ABNORMAL LOW (ref 135–145)

## 2018-09-28 LAB — BPAM RBC
Blood Product Expiration Date: 202007242359
Blood Product Expiration Date: 202007242359
ISSUE DATE / TIME: 202007071609
ISSUE DATE / TIME: 202007071609
Unit Type and Rh: 6200
Unit Type and Rh: 6200

## 2018-09-28 LAB — TYPE AND SCREEN
ABO/RH(D): A POS
Antibody Screen: NEGATIVE
Unit division: 0
Unit division: 0

## 2018-09-28 LAB — RETICULOCYTES
Immature Retic Fract: 43.9 % — ABNORMAL HIGH (ref 2.3–15.9)
RBC.: 3.19 MIL/uL — ABNORMAL LOW (ref 3.87–5.11)
Retic Count, Absolute: 174.8 10*3/uL (ref 19.0–186.0)
Retic Ct Pct: 5.5 % — ABNORMAL HIGH (ref 0.4–3.1)

## 2018-09-28 LAB — IRON AND TIBC
Iron: 62 ug/dL (ref 28–170)
Saturation Ratios: 17 % (ref 10.4–31.8)
TIBC: 356 ug/dL (ref 250–450)
UIBC: 294 ug/dL

## 2018-09-28 LAB — CBC
HCT: 29 % — ABNORMAL LOW (ref 36.0–46.0)
Hemoglobin: 9.3 g/dL — ABNORMAL LOW (ref 12.0–15.0)
MCH: 29.2 pg (ref 26.0–34.0)
MCHC: 32.1 g/dL (ref 30.0–36.0)
MCV: 90.9 fL (ref 80.0–100.0)
Platelets: 114 10*3/uL — ABNORMAL LOW (ref 150–400)
RBC: 3.19 MIL/uL — ABNORMAL LOW (ref 3.87–5.11)
RDW: 14.5 % (ref 11.5–15.5)
WBC: 10.5 10*3/uL (ref 4.0–10.5)
nRBC: 0.4 % — ABNORMAL HIGH (ref 0.0–0.2)

## 2018-09-28 LAB — FOLATE: Folate: 13.3 ng/mL (ref >5.9)

## 2018-09-28 LAB — FERRITIN: Ferritin: 32 ng/mL (ref 11–307)

## 2018-09-28 LAB — VITAMIN B12: Vitamin B-12: 386 pg/mL (ref 180–914)

## 2018-09-28 MED ORDER — HYDROCODONE-ACETAMINOPHEN 5-325 MG PO TABS
1.0000 | ORAL_TABLET | Freq: Four times a day (QID) | ORAL | Status: DC | PRN
  Administered 2018-09-28 – 2018-09-30 (×9): 1 via ORAL
  Filled 2018-09-28 (×9): qty 1

## 2018-09-28 MED ORDER — LACTULOSE 10 GM/15ML PO SOLN
20.0000 g | Freq: Two times a day (BID) | ORAL | Status: DC
  Administered 2018-09-28 – 2018-09-30 (×3): 20 g via ORAL
  Filled 2018-09-28 (×3): qty 30

## 2018-09-28 MED ORDER — GERHARDT'S BUTT CREAM
TOPICAL_CREAM | Freq: Four times a day (QID) | CUTANEOUS | Status: DC
  Administered 2018-09-28 – 2018-09-30 (×7): via TOPICAL
  Filled 2018-09-28: qty 1

## 2018-09-28 NOTE — Progress Notes (Signed)
Brittany Russo  PROGRESS NOTE    Brittany Russo  NOM:[id number] DOB: [birth year] DOA: 09/24/2018 PCP: Rosalee Kaufman, PA-C   Brief Narrative:   73 year old with past medical history significant for depression, anxiety, cirrhosis of the liver nonalcoholic, depression, endometrial cancer, history of hypertension, diabetes, history of seizure, history of vaginal cancer who presents to the hospital complaining of right knee pain and is swelling seen her recent right total knee arthroplasty.  She was discharged home following surgery when she was arriving at home she had 2 falls leaving the hospital and getting out of her car.  She presented back to the emergency department and was found to have a periprosthetic femur fracture with subluxation of total right knee arthroplasty. Patient was supposed to go to the OR the morning of 6 July 5 but she was found to be very encephalopathic and somnolent, code stroke was called and telemetry neurology evaluated patient. [facility] was consulted to help with medical management.   Assessment & Plan:   Active Problems:   DM2 (diabetes mellitus, type 2) ([place])   Hypertension   Hyperlipidemia   Periprosthetic fracture around internal prosthetic right knee joint   Depression   Acute hepatic encephalopathy   Non-alcoholic cirrhosis ([place])   acute hepatic encephalopathy     - Patient was somnolent, right hand weakness and tremors.     - Ammonia was elevated.     - Code stroke was activated on 7/5.  CT head was negative for acute intracranial bleed.     - Tele-neurology was consulted, Dr. Graylon Good did not recommend thrombolytic therapy or emergent vascular imaging.  Patient presentation likely related to hepatic encephalopathy.     - EEG awaiting results     - lactulose was resumed; ammonia improved, pt is A&O x 3     - apparently she is non-compliant w/ lactulose at home d/t diarrhea; currently complaining or sore bottom and D, will decrease lactulose frequency   Cirrhosis: Nonalcoholic     - Abdominal ultrasound: Mild ascites     - Mild elevation of AST, bilirubin 1.3.  Anemia, normocytic     - no bleeds noted     - Hgb stable at 9.3     - continue iron supplement  Hyponatremia, hypochloremia     - Agree with holding hydrochlorothiazide     - Continue with IV fluids.  We will hold irbesartan   Thrombocytopenia:     - Related to cirrhosis.  Stable.  Diabetes     - Continue to hold linagliptin, metformin and glipizide     - Sliding scale insulin     - Continue with Lantus  Hypertension     - Continue with diltiazem, atenolol.     - irbesartan held  Depression, anxiety     - continue citalopram  Obesity    - BMI 38.35     - diet, exercise counseled  A&O x 3 this AM. Right hand weakness resolved. She c/o soreness w/ diarrhea. Decrease lactulose frequency. Monitor mentation.  Code Status: FULL Disposition Plan: Per primary    Subjective: "Can I stop the lactulose?"  Objective: Vitals:   09/28/18 0535 09/28/18 1048 09/28/18 1049 09/28/18 1318  BP:  (!) 133/59  (!) 140/49  Pulse: (!) 106 (!) 103 (!) 105 82  Resp: 18 18  16   Temp:  98.1 F (36.7 C)  98.1 F (36.7 C)  TempSrc:  Oral    SpO2: 100% 100%  100%  Weight:  Height:        Intake/Output Summary (Last 24 hours) at 09/28/2018 1428 Last data filed at 09/28/2018 1300 Gross per 24 hour  Intake 2440 ml  Output 1671 ml  Net 769 ml   Filed Weights   09/24/18 0450 09/27/18 1339  Weight: 95.1 kg 95.1 kg    Examination:  General: 73 y.o. female resting in bed in NAD Cardiovascular: RRR, +S1, S2, no m/g/r, equal pulses throughout Respiratory: CTABL, no w/r/r, normal WOB GI: BS+, NDNT, no masses noted, no organomegaly noted MSK: No e/c/c Skin: No rashes, bruises, ulcerations noted Neuro: A&O x 3, no focal deficits   Data Reviewed: I have personally reviewed following labs and imaging studies.  CBC: Recent Labs  Lab 09/24/18 0308 09/25/18 1119  09/26/18 0818 09/27/18 0317 09/27/18 1916 09/28/18 0334  WBC 13.4* 7.9 7.6 4.8  --  10.5  NEUTROABS 10.4*  --  5.5  --   --   --   HGB 8.6* 8.2* 8.0* 8.1* 9.0* 9.3*  HCT 26.4* 24.2* 25.2* 24.3* 27.3* 29.0*  MCV 88.6 87.7 89.4 88.4  --  90.9  PLT 120* 85* 94* 71*  --  161*   Basic Metabolic Panel: Recent Labs  Lab 09/24/18 0308 09/25/18 1119 09/26/18 0818 09/27/18 0317 09/28/18 0334  NA 129* 126* 127* 130* 132*  K 4.1 3.8 3.8 3.5 4.7  CL 98 93* 94* 97* 102  CO2 22 22 21* 26 22  GLUCOSE 267* 195* 225* 134* 270*  BUN 24* 33* 24* 16 25*  CREATININE 0.83 0.94 0.73 0.44 0.90  CALCIUM 8.7* 8.2* 7.9* 7.8* 7.7*  MG  --   --  2.0  --   --   PHOS  --   --  2.7  --   --    GFR: Estimated Creatinine Clearance: 62.5 mL/min (by C-G formula based on SCr of 0.9 mg/dL). Liver Function Tests: Recent Labs  Lab 09/25/18 1119 09/26/18 0818  AST 37 50*  ALT 22 26  ALKPHOS 140* 136*  BILITOT 1.2 1.3*  PROT 5.7* 5.4*  ALBUMIN 2.9* 2.6*   No results for input(s): LIPASE, AMYLASE in the last 168 hours. Recent Labs  Lab 09/25/18 1119 09/26/18 0814  AMMONIA 66* 41*   Coagulation Profile: No results for input(s): INR, PROTIME in the last 168 hours. Cardiac Enzymes: No results for input(s): CKTOTAL, CKMB, CKMBINDEX, TROPONINI in the last 168 hours. BNP (last 3 results) No results for input(s): PROBNP in the last 8760 hours. HbA1C: No results for input(s): HGBA1C in the last 72 hours. CBG: Recent Labs  Lab 09/27/18 1843 09/27/18 2212 09/28/18 0510 09/28/18 0726 09/28/18 1143  GLUCAP 159* 182* 251* 280* 316*   Lipid Profile: No results for input(s): CHOL, HDL, LDLCALC, TRIG, CHOLHDL, LDLDIRECT in the last 72 hours. Thyroid Function Tests: No results for input(s): TSH, T4TOTAL, FREET4, T3FREE, THYROIDAB in the last 72 hours. Anemia Panel: Recent Labs    09/28/18 0334  VITAMINB12 386  FOLATE 13.3  FERRITIN 32  TIBC 356  IRON 62  RETICCTPCT 5.5*   Sepsis Labs: No  results for input(s): PROCALCITON, LATICACIDVEN in the last 168 hours.  Recent Results (from the past 240 hour(s))  Surgical pcr screen     Status: None   Collection Time: 09/19/18  1:34 PM   Specimen: Nasal Mucosa; Nasal Swab  Result Value Ref Range Status   MRSA, PCR NEGATIVE NEGATIVE Final   Staphylococcus aureus NEGATIVE NEGATIVE Final    Comment: (NOTE) The Xpert  SA Assay (FDA approved for NASAL specimens in patients 61 years of age and older), is one component of a comprehensive surveillance program. It is not intended to diagnose infection nor to guide or monitor treatment. Performed at [facility], [place] [address] [place], [place] [address]   SARS Coronavirus 2 (Performed in [facility] hospital lab)     Status: None   Collection Time: 09/19/18  2:58 PM   Specimen: Nasal Swab  Result Value Ref Range Status   SARS Coronavirus 2 NEGATIVE NEGATIVE Final    Comment: (NOTE) SARS-CoV-2 target nucleic acids are NOT DETECTED. The SARS-CoV-2 RNA is generally detectable in upper and lower respiratory specimens during the acute phase of infection. Negative results do not preclude SARS-CoV-2 infection, do not rule out co-infections with other pathogens, and should not be used as the sole basis for treatment or other patient management decisions. Negative results must be combined with clinical observations, patient history, and epidemiological information. The expected result is Negative. Fact Sheet for Patients: SugarRoll.be Fact Sheet for Healthcare Providers: https://www.woods-mathews.com/ This test is not yet approved or cleared by the [place] FDA and  has been authorized for detection and/or diagnosis of SARS-CoV-2 by FDA under an Emergency Use Authorization (EUA). This EUA will remain  in effect (meaning this test can be used) for the duration of the COVID-19 declaration under Section 56 4(b)(1) of the Act, 21  U.S.C. section 360bbb-3(b)(1), unless the authorization is terminated or revoked sooner. Performed at [facility], [place] [address] [place], [place] [address]   Urine Culture     Status: Abnormal   Collection Time: 09/22/18  7:18 AM   Specimen: Urine, Clean Catch  Result Value Ref Range Status   Specimen Description   Final    URINE, CLEAN CATCH Performed at [facility], [place] [address] [place], [place] [address]    Special Requests   Final    NONE Performed at [facility], [place] [address] [place], [place] [address]    Culture (A)  Final    <10,000 COLONIES/mL INSIGNIFICANT GROWTH Performed at [place] [address], [place], [place] [address]    Report Status 09/23/2018 FINAL  Final  SARS Coronavirus 2 (CEPHEID - Performed in [place] hospital lab), Hosp Order     Status: None   Collection Time: 09/24/18  8:13 AM   Specimen: Nasopharyngeal Swab  Result Value Ref Range Status   SARS Coronavirus 2 NEGATIVE NEGATIVE Final    Comment: (NOTE) If result is NEGATIVE SARS-CoV-2 target nucleic acids are NOT DETECTED. The SARS-CoV-2 RNA is generally detectable in upper and lower  respiratory specimens during the acute phase of infection. The lowest  concentration of SARS-CoV-2 viral copies this assay can detect is 250  copies / mL. A negative result does not preclude SARS-CoV-2 infection  and should not be used as the sole basis for treatment or other  patient management decisions.  A negative result may occur with  improper specimen collection / handling, submission of specimen other  than nasopharyngeal swab, presence of viral mutation(s) within the  areas targeted by this assay, and inadequate number of viral copies  (<250 copies / mL). A negative result must be combined with clinical  observations, patient history, and epidemiological information. If result is POSITIVE SARS-CoV-2 target nucleic acids are DETECTED. The  SARS-CoV-2 RNA is generally detectable in upper and lower  respiratory specimens dur ing the acute phase of  infection.  Positive  results are indicative of active infection with SARS-CoV-2.  Clinical  correlation with patient history and other diagnostic information is  necessary to determine patient infection status.  Positive results do  not rule out bacterial infection or co-infection with other viruses. If result is PRESUMPTIVE POSTIVE SARS-CoV-2 nucleic acids MAY BE PRESENT.   A presumptive positive result was obtained on the submitted specimen  and confirmed on repeat testing.  While 2019 novel coronavirus  (SARS-CoV-2) nucleic acids may be present in the submitted sample  additional confirmatory testing may be necessary for epidemiological  and / or clinical management purposes  to differentiate between  SARS-CoV-2 and other Sarbecovirus currently known to infect humans.  If clinically indicated additional testing with an alternate test  methodology [phone]) is advised. The SARS-CoV-2 RNA is generally  detectable in upper and lower respiratory sp ecimens during the acute  phase of infection. The expected result is Negative. Fact Sheet for Patients:  StrictlyIdeas.no Fact Sheet for Healthcare Providers: BankingDealers.co.za This test is not yet approved or cleared by the [place] FDA and has been authorized for detection and/or diagnosis of SARS-CoV-2 by FDA under an Emergency Use Authorization (EUA).  This EUA will remain in effect (meaning this test can be used) for the duration of the COVID-19 declaration under Section 564(b)(1) of the Act, 21 U.S.C. section 360bbb-3(b)(1), unless the authorization is terminated or revoked sooner. Performed at [facility], [place] [address], [place], [place] [address]   Surgical pcr screen     Status: None   Collection Time: 09/26/18 12:10 AM   Specimen: Nasal Mucosa;  Nasal Swab  Result Value Ref Range Status   MRSA, PCR NEGATIVE NEGATIVE Final   Staphylococcus aureus NEGATIVE NEGATIVE Final    Comment: (NOTE) The Xpert SA Assay (FDA approved for NASAL specimens in patients 59 years of age and older), is one component of a comprehensive surveillance program. It is not intended to diagnose infection nor to guide or monitor treatment. Performed at [facility], [place] [address] [place],  [address]      Radiology Studies: No results found.   Scheduled Meds: . sodium chloride   Intravenous Once  . ALPRAZolam  0.25 mg Oral BID  . [START ON 09/29/2018] aspirin  81 mg Oral BID  . atenolol  50 mg Oral Daily  . cefUROXime  250 mg Oral BID  . celecoxib  200 mg Oral BID  . citalopram  40 mg Oral Daily  . dexamethasone (DECADRON) injection  10 mg Intravenous Once  . diltiazem  180 mg Oral Daily  . docusate sodium  100 mg Oral BID  . ferrous sulfate  325 mg Oral BID WC  . fluticasone  1 spray Each Nare Daily  . insulin aspart  0-15 Units Subcutaneous TID WC  . insulin aspart  0-5 Units Subcutaneous QHS  . insulin glargine  65 Units Subcutaneous Daily  . lactulose  20 g Oral BID  . loratadine  10 mg Oral Daily  . montelukast  10 mg Oral QHS  . polyethylene glycol  17 g Oral BID  . potassium chloride  10 mEq Oral Daily  . simvastatin  40 mg Oral QHS   Continuous Infusions: . sodium chloride 75 mL/hr at 09/27/18 1958  . sodium chloride    . methocarbamol (ROBAXIN) IV       LOS: 4 days    Time spent: 25 minutes spent in the coordination of care today.  Jonnie Finner, DO [facility] Pager [phone]  If 7PM-7AM, please contact night-coverage [email or web address] Password [email or web address] 09/28/2018, 2:28 PM

## 2018-09-28 NOTE — Op Note (Signed)
NAMEANAYELLI, LAI MEDICAL RECORD OT:[id number] ACCOUNT [id number] DATE OF BIRTH:09-07-1945 FACILITY: [facility] LOCATION: [id number] PHYSICIAN:Kel Senn D. Kresta Templeman, MD  OPERATIVE REPORT  DATE OF PROCEDURE:  09/27/2018  PREOPERATIVE DIAGNOSIS:  Right closed comminuted distal periprosthetic femur fracture with failure of implant.  POSTOPERATIVE DIAGNOSIS:  Right closed comminuted distal periprosthetic femur fracture with failure of implant.  FINDINGS:  See body of operative note for details of the findings.  PROCEDURE:  Revision right total knee replacement to a S-ROM hinged knee system.  COMPONENTS USED:  A size extra-small S-ROM Knowles femoral rotating hinge component with a size 34 mm universal femoral sleeve, porous, with a 75 x 12 mm cemented stem.  On the tibia side, it was a size 2.5 MBT revision tray with 5 mm augments medially  and laterally, a 29 cemented sleeve and a 14 x 30 mm cemented stem.  In addition, I used a size 14 mm extra small tibial hinged insert.  SURGEON:  Paralee Cancel, MD  ASSISTANT:  Griffith Citron PA-C as primary assistant;  Danae Orleans, PA-C was in assistance as well.  ANESTHESIA:  Regional plus general.  ESTIMATED BLOOD LOSS:  About 300 mL.  TOURNIQUET:  Up at 300 mmHg for 19 minutes.   DRAINS: I did place a medium Hemovac drain into the suprapatellar space.  INDICATIONS  FOR PROCEDURE:  The patient is a 73 year old female who had a index right total knee replacement performed on Thursday, 09/22/2018.  She had done well in the hospital and progressed with physical therapy and was discharged on 09/23/2018.   Unfortunately, when she had returned home, she had a traumatic fall on her knee.  She had inability to bear weight and was brought to the emergency room.  Radiographic assessment revealed that she had a severely comminuted distal femur fracture with dislocation of her femoral component.  She was admitted to the hospital.  While hospitalized, her medical  condition seemed to deteriorate with the  complications associated with hepatic encephalopathy.  Surgery was scheduled.  Based on her radiographic assessment and CT scan, I felt that her only option was to revise her to a hinged knee component.  Risks of infection noted to be very high in this  patient based on her repeat operation, DVT, failure of her implant, failure of the extensor mechanism were all reviewed.  Consent was obtained for attempted salvage of her knee.  PROCEDURE IN DETAIL:  The patient was brought to the operative theater.  Once adequate anesthesia, preoperative antibiotics, Ancef administered as well as tranexamic acid.  She was positioned supine with a right thigh tourniquet placed.  The right lower  extremity was then prepped and draped in sterile fashion.  A timeout was performed identifying the patient, the planned procedure and extremity.  The leg was exsanguinated and tourniquet was initially elevated to 250 mmHg, however, was elevated to 300  mmHg due to systolic elevated pressures.  The old incision was identified and extended proximal and distal for exposure.  The extensor mechanism was then exposed noting severe destruction of the extensor mechanism, particularly distally.  The knee was  exposed and in doing so, the femoral component was completely loose.  The polyethylene was removed.  She was noted to have fractures to both the medial and lateral epicondyle with just the cortices remaining.  Following initial exposure and debridement,  I irrigated the knee with about 500 mL of fluid to remove hematoma to allow for visualization.  With the knee flexed and retractors in  place, I used a thin oscillating saw, followed by osteotomes to remove the tibial tray.  The cement was removed.  I  evaluated the proximal tibial cut and sized it to a size 2.5 tibial tray which would fit best.  Based on the proximal tibial cut, it was well aligned.  I drilled for an MBT revision tray with a 13  x 30 stem.  I hand reamed up to 14 mm on the stem to  allow for the cement and stem to be placed.  I broached for a 29 cemented sleeve.  We placed a trial component with 5 mm medial and lateral augments on the tibia with a 29 trial broach.  This tibial tray was placed into the knee for protection of the  tibia, but also for evaluation and trialing.  Once I finished the tibia side, we went in and evaluated the femur.  It was decided that the only option was for her to have a  S-ROM hinged component.  I reamed the femur up to 15 mm to allow for cemented stem and then broached.  Initially we broached to a 29 broach and did a trial reduction; however, there was too much hyperextension.  I then revised.  I took the trial  components out and posted a 34 broach.  With a 35 broach and a 75 mm stem in place, we attached the extra small S-ROM distal femur.  There was no cuts needed to be done.  I removed the medial and lateral shelf of her epicondyle.  We then did a trial  reduction.  Given the trial reduction, we determined that a size 14 mm insert was best with typical hyperextension appreciated for this system.  Given these findings, we removed all the trial components.  I used a canal brush irrigator to irrigate the  canals and clean them.  We irrigated the rest of the knee.  Final components were opened and configured on the back table.  Once these were ready, we placed cement restrictors at an appropriate depth.  Cement was mixed.  Three batches of cement were  mixed with 3 grams of vancomycin.  The tibial component was cemented into place first and impacted down in the correct orientation.  I cemented into the canal of the distal femur to the cement mantle and then impacted the femoral stem with a fully porous  coated 34 mm porous coated broach and impacted into the remaining distal femur.  The knee was brought to full extension with a 14 mm with 14 mm insert until the cement cured.  Once the cement cured,  excessive cement was removed throughout the knee.  We  then selected the final 14 mm insert.  The hinged component was fit into the S-ROM component and the locking bolt was placed.    At this point, attention was now directed to attempt at repair of her extensor mechanism.  The proximal portion of her extensor mechanism was intact to the aspect of the patella.  There was noted to be significant disruption to the medial retinacular  tissues.  I was able to reapproximate a sleeve of tissue to the extensor mechanism, which again was intact as the patella tendon and lateral retinaculum.  It was just the medial side that was disrupted.  There was a sleeve of continuity present at this  point.  The extensor mechanism was reapproximated using interrupted #1 Vicryl sutures and then two #1 Stratafix sutures running and locking this together.  Based  on the disruption of the medial retinaculum and the lateral retinaculum, I decided to place  a medium Hemovac drain into this space to try to reduce the chances for postoperative hematoma and seroma in this area.  The remainder of the wound was closed with 2-0 Vicryl and a running Monocryl stitch.  The drain was dressed with a Xeroform dressing  and the knee wound was surgical glue and Aquacel dressing.  A bulky dressing was applied in addition to a knee immobilizer.  She was then brought to the recovery room in stable condition.    Postoperatively, we will need to monitor her medical status with the assistance of the hospitalist service.  We will keep her in a knee immobilizer in extension for an unknown period of time to allow for the extensor mechanism to heal.  She will be  allowed to be weightbearing as tolerated.  We may fit her with T-ROM brace locked in extension.  Findings were reviewed with her husband.  AN/NUANCE  D:09/27/2018 T:09/28/2018 JOB:[id number]

## 2018-09-28 NOTE — Discharge Summary (Signed)
Physician Discharge Summary   Patient ID: Brittany Russo MRN: [id number] DOB/AGE: December 05, 1945 73 y.o.  Admit date: 09/22/2018 Discharge date: 09/23/2018  Primary Diagnosis: Right knee osteoarthritis  Admission Diagnoses:  Past Medical History:  Diagnosis Date   Allergy    Anxiety    Arthritis    Cirrhosis of liver ([place])    nonalcoholic per patient , reports she takes a medication that makes her have bowel movements  to treat her liver    Depression    Diabetes mellitus without complication ([place])    type 2   Eczema of both hands    Endometrial cancer ([place]) 2005   treated with radiation therapy     Headache    Hyperlipidemia    Hypertension    Seizures ([place])    last seizure June 07, 2014    Thyroid nodule    Vaginal cancer [facility])    Discharge Diagnoses:   Active Problems:   S/P right TKA   Status post total knee replacement, right  Estimated body mass index is 35.35 kg/m as calculated from the following:   Height as of this encounter: 5' 2" (1.575 m).   Weight as of this encounter: 87.7 kg.  Procedure:  Procedure(s) (LRB): TOTAL KNEE ARTHROPLASTY (Right)   Consults: None  HPI: Brittany Russo is a 73 y.o. female patient of   mine.  The patient had been seen, evaluated, and treated for months conservatively in the  office with medication, activity modification, and injections.  The patient had  radiographic changes of bone-on-bone arthritis with endplate sclerosis and osteophytes noted.  Based on the radiographic changes and failed conservative measures, the patient decided to proceed with definitive treatment, total knee replacement.  Risks of infection, DVT, component failure, need for revision surgery, neurovascular injury were reviewed in the office setting.  The postop course was reviewed stressing the efforts to maximize post-operative satisfaction and function.  Consent was obtained for benefit of pain   relief.   Laboratory Data: Admission on  09/22/2018, Discharged on 09/23/2018  Component Date Value Ref Range Status   Hgb A1c MFr Bld 09/22/2018 6.2* 4.8 - 5.6 % Final   Comment: (NOTE) Pre diabetes:          5.7%-6.4% Diabetes:              >6.4% Glycemic control for   <7.0% adults with diabetes    Mean Plasma Glucose 09/22/2018 131.24  mg/dL Final   Performed at [facility] [address], [place], [place] [address]   Glucose-Capillary 09/22/2018 160* 70 - 99 mg/dL Final   Specimen Description 09/22/2018    Final                   Value:URINE, CLEAN CATCH Performed at [facility], [place] [address], [place], [place] [address]    Special Requests 09/22/2018    Final                   Value:NONE Performed at [facility], [place] [address] [place], [place] [address]    Culture 09/22/2018 *  Final                   Value:<10,000 COLONIES/mL INSIGNIFICANT GROWTH Performed at [facility] [address], [place], [place] [address]    Report Status 09/22/2018 09/23/2018 FINAL   Final   Color, Urine 09/22/2018 YELLOW  YELLOW Final   APPearance 09/22/2018 CLEAR  CLEAR Final   Specific Gravity, Urine 09/22/2018 <1.005* 1.005 - 1.030 Final   pH 09/22/2018 6.5  5.0 - 8.0 Final   Glucose, UA 09/22/2018 NEGATIVE  NEGATIVE mg/dL Final   Hgb urine dipstick 09/22/2018 MODERATE* NEGATIVE Final   Bilirubin Urine 09/22/2018 NEGATIVE  NEGATIVE Final   Ketones, ur 09/22/2018 NEGATIVE  NEGATIVE mg/dL Final   Protein, ur 09/22/2018 NEGATIVE  NEGATIVE mg/dL Final   Nitrite 09/22/2018 NEGATIVE  NEGATIVE Final   Leukocytes,Ua 09/22/2018 NEGATIVE  NEGATIVE Final   Performed at [facility] [address], [place], [place] [address]   RBC / HPF 09/22/2018 0-5  0 - 5 RBC/hpf Final   WBC, UA 09/22/2018 0-5  0 - 5 WBC/hpf Final   Bacteria, UA 09/22/2018 RARE* NONE SEEN Final   Squamous Epithelial / LPF 09/22/2018 0-5  0 - 5 Final   Performed at [facility], [place] [address] [place], [place] [address]   Glucose-Capillary 09/22/2018 162* 70 - 99 mg/dL Final   Glucose-Capillary 09/22/2018 209* 70 - 99 mg/dL Final   Glucose-Capillary 09/22/2018 340* 70 - 99 mg/dL Final   WBC 09/23/2018 5.1  4.0 - 10.5 K/uL Final   RBC 09/23/2018 3.12* 3.87 - 5.11 MIL/uL Final   Hemoglobin 09/23/2018 9.0* 12.0 - 15.0 g/dL Final   HCT 09/23/2018 27.8* 36.0 - 46.0 % Final   MCV 09/23/2018 89.1  80.0 - 100.0 fL Final   MCH 09/23/2018 28.8  26.0 - 34.0 pg Final   MCHC 09/23/2018 32.4  30.0 - 36.0 g/dL Final   RDW 09/23/2018 12.8  11.5 - 15.5 % Final   Platelets 09/23/2018 58* 150 - 400 K/uL Final   Comment: Immature Platelet Fraction may be clinically indicated, consider ordering this additional test [id number] CONSISTENT WITH PREVIOUS RESULT    nRBC 09/23/2018 0.0  0.0 - 0.2 % Final   Performed at [facility], [place] [address], [place], [place] [address]   Sodium 09/23/2018 135  135 - 145 mmol/L Final   Potassium 09/23/2018 4.1  3.5 - 5.1 mmol/L Final   Chloride 09/23/2018 104  98 - 111 mmol/L Final   CO2 09/23/2018 25  22 - 32 mmol/L Final   Glucose, Bld 09/23/2018 273* 70 - 99 mg/dL Final   BUN 09/23/2018 15  8 - 23 mg/dL Final   Creatinine, Ser 09/23/2018 0.70  0.44 - 1.00 mg/dL Final   Calcium 09/23/2018 8.4* 8.9 - 10.3 mg/dL Final   GFR calc non Af Amer 09/23/2018 >60  >60 mL/min Final   GFR calc Af Amer 09/23/2018 >60  >60 mL/min Final   Anion gap 09/23/2018 6  5 - 15 Final   Performed at [facility], [place] [address], [place], [place] [address]   Glucose-Capillary 09/22/2018 303* 70 - 99 mg/dL Final   Glucose-Capillary 09/23/2018 240* 70 - 99 mg/dL Final   Glucose-Capillary 09/23/2018 257* 70 - 99 mg/dL Final  Hospital Outpatient Visit on 09/19/2018  Component Date Value Ref Range Status   Glucose-Capillary 09/19/2018 84  70 - 99 mg/dL Final   Sodium 09/19/2018  140  135 - 145 mmol/L Final   Potassium 09/19/2018 4.0  3.5 - 5.1 mmol/L Final   Chloride 09/19/2018 106  98 - 111 mmol/L Final   CO2 09/19/2018 26  22 - 32 mmol/L Final   Glucose, Bld 09/19/2018 83  70 - 99 mg/dL Final   BUN 09/19/2018 13  8 - 23 mg/dL Final  Creatinine, Ser 09/19/2018 0.72  0.44 - 1.00 mg/dL Final   Calcium 09/19/2018 9.2  8.9 - 10.3 mg/dL Final   GFR calc non Af Amer 09/19/2018 >60  >60 mL/min Final   GFR calc Af Amer 09/19/2018 >60  >60 mL/min Final   Anion gap 09/19/2018 8  5 - 15 Final   Performed at [facility] [address] [place], [place] [address]   WBC 09/19/2018 4.7  4.0 - 10.5 K/uL Final   RBC 09/19/2018 3.88  3.87 - 5.11 MIL/uL Final   Hemoglobin 09/19/2018 11.6* 12.0 - 15.0 g/dL Final   HCT 09/19/2018 35.0* 36.0 - 46.0 % Final   MCV 09/19/2018 90.2  80.0 - 100.0 fL Final   MCH 09/19/2018 29.9  26.0 - 34.0 pg Final   MCHC 09/19/2018 33.1  30.0 - 36.0 g/dL Final   RDW 09/19/2018 12.9  11.5 - 15.5 % Final   Platelets 09/19/2018 76* 150 - 400 K/uL Final   Comment: Immature Platelet Fraction may be clinically indicated, consider ordering this additional test [id number]    nRBC 09/19/2018 0.0  0.0 - 0.2 % Final   Performed at [facility], [place] [address] [place], [place] [address]   ABO/RH(D) 09/19/2018 A POS   Final   Antibody Screen 09/19/2018 NEG   Final   Sample Expiration 09/19/2018 09/25/2018,2359   Final   Extend sample reason 09/19/2018    Final                   Value:NO TRANSFUSIONS OR PREGNANCY IN THE PAST 3 MONTHS Performed at [facility] [address], [place], [place] [address]    MRSA, PCR 09/19/2018 NEGATIVE  NEGATIVE Final   Staphylococcus aureus 09/19/2018 NEGATIVE  NEGATIVE Final   Comment: (NOTE) The Xpert SA Assay (FDA approved for NASAL specimens in patients 13 years of age and older), is one component of a comprehensive surveillance  program. It is not intended to diagnose infection nor to guide or monitor treatment. Performed at [facility], [place] [address] [place], [place] [address]    ABO/RH(D) 09/19/2018    Final                   Value:A POS Performed at [facility], [place] [address] [place],  [address]   Hospital Outpatient Visit on 09/19/2018  Component Date Value Ref Range Status   SARS Coronavirus 2 09/19/2018 NEGATIVE  NEGATIVE Final   Comment: (NOTE) SARS-CoV-2 target nucleic acids are NOT DETECTED. The SARS-CoV-2 RNA is generally detectable in upper and lower respiratory specimens during the acute phase of infection. Negative results do not preclude SARS-CoV-2 infection, do not rule out co-infections with other pathogens, and should not be used as the sole basis for treatment or other patient management decisions. Negative results must be combined with clinical observations, patient history, and epidemiological information. The expected result is Negative. Fact Sheet for Patients: SugarRoll.be Fact Sheet for Healthcare Providers: https://www.woods-mathews.com/ This test is not yet approved or cleared by the [place] FDA and  has been authorized for detection and/or diagnosis of SARS-CoV-2 by FDA under an Emergency Use Authorization (EUA). This EUA will remain  in effect (meaning this test can be used) for the duration of the COVID-19 declaration under Section 56                          4(b)(1) of  the Act, 21 U.S.C. section 360bbb-3(b)(1), unless the authorization is terminated or revoked sooner. Performed at [facility], [place] [address] [place], [place] [address]      X-Rays:Ct Knee Right Wo Contrast  Result Date: 09/24/2018 CLINICAL DATA:  Fracture dislocation. Recent total knee arthroplasty with knee injury EXAM: CT OF THE right KNEE WITHOUT CONTRAST TECHNIQUE: Multidetector CT imaging of the  right knee was performed according to the standard protocol. Multiplanar CT image reconstructions were also generated. COMPARISON:  Radiograph 09/24/2018 FINDINGS: Examination is limited by significant artifact. As demonstrated on the radiographs there is a complex periprosthetic fracture involving the femoral component with significant varus deformity. There is also a posterior knee dislocation. Possible subtle fracture involving the lateral tibial plateau on the sagittal reformatted images but I can not confirm that on other views and may be artifactual. Appears to be a small avulsion fracture at the tibial tubercle. IMPRESSION: 1. Complex periprosthetic fracture involving the femoral component of the total knee arthroplasty. Significant varus deformity. 2. Posterior knee dislocation. 3. Avulsion fracture involving the tibial tubercle. No definite other tibial fractures. Electronically Signed   By: Marijo Sanes M.D.   On: 09/24/2018 11:15   US Abdomen Complete  Result Date: 09/26/2018 CLINICAL DATA:  Cirrhosis. EXAM: ABDOMEN ULTRASOUND COMPLETE COMPARISON:  Abdominal ultrasound 04/19/2015. MRCP 04/26/2015 and CT abdomen 04/07/2017. FINDINGS: Gallbladder: Mild gallbladder wall thickening without sonographic Murphy's sign. No evidence of gallstones. Common bile duct: Diameter: 6 mm Liver: There are morphologic changes of cirrhosis with heterogeneity of the hepatic parenchyma. No focal lesions are identified. Portal vein is patent on color Doppler imaging with normal direction of blood flow towards the liver. IVC: No abnormality visualized. Pancreas: Visualized portion unremarkable. Spleen: Size and appearance within normal limits. Right Kidney: Length: 9.5 cm. Echogenicity within normal limits. No mass or hydronephrosis visualized. Left Kidney: Length: 10.5 cm. Echogenicity within normal limits. No mass or hydronephrosis visualized. Abdominal aorta: No aneurysm visualized. Portions of the aorta are obscured by  bowel gas. Other findings: A small amount of ascites is present in the right and left upper quadrants of the abdomen. IMPRESSION: 1. Morphologic changes of cirrhosis without acute or focal abnormality identified. 2. Mild nonspecific gallbladder wall thickening, likely related to underlying liver disease. 3. Mild ascites. Electronically Signed   By: Richardean Sale M.D.   On: 09/26/2018 09:32   Dg Knee Complete 4 Views Right  Result Date: 09/24/2018 CLINICAL DATA:  Right knee replacement, discharged yesterday. Twisting knee getting to the car at discharge with 2 falls tonight. Right knee pain. EXAM: RIGHT KNEE - COMPLETE 4+ VIEW COMPARISON:  None. FINDINGS: The femoral component of right knee arthroplasty is dislocated anteriorly with respect to the tibial tray. Acute periprosthetic fracture of the distal femur, fracture plane not well assessed due to dislocation. Patella appears aligned with the femoral component, there is been patellar resurfacing. Diffuse soft tissue edema. IMPRESSION: Acute fracture dislocation of total right knee arthroplasty with anterior dislocation of the femoral component and associated periprosthetic femoral fracture. Electronically Signed   By: Keith Rake M.D.   On: 09/24/2018 02:25   Ct Head Code Stroke Wo Contrast  Result Date: 09/25/2018 CLINICAL DATA:  Code stroke. Slurred speech. Tremors. Altered mental status. Week graft right. EXAM: CT HEAD WITHOUT CONTRAST TECHNIQUE: Contiguous axial images were obtained from the base of the skull through the vertex without intravenous contrast. COMPARISON:  06/08/2015 FINDINGS: Brain: Mild age related volume. No evidence of old or acute focal infarction,  mass lesion, hydrocephalus or extra-axial collection. Vascular: No abnormal vascular finding Skull: Normal Sinuses/Orbits: Inflammatory changes of the paranasal sinuses affecting sinus Other: None ASPECTS ([place] Stroke Program Early CT Score) - Ganglionic level infarction (caudate,  lentiform nuclei, internal capsule, insula, M1-M3 cortex): 7 - Supraganglionic infarction (M4-M6 cortex): 3 Total score (0-10 with 10 being normal): 10 IMPRESSION: 1. Normal head CT for age. 2. Paranasal sinusitis worse in the right maxillary sinus. 3. ASPECTS is 10. 4. I spoke to the patient's nurse at 1050 hours, but we are unable to reach a covering physician but will keep trying. Electronically Signed   By: Nelson Chimes M.D.   On: 09/25/2018 10:55    EKG: Orders placed or performed during the hospital encounter of 06/01/17   EKG 12-Lead   EKG 12-Lead     Hospital Course: JALAIYA OYSTER is a 73 y.o. who was admitted to [facility]. They were brought to the operating room on 09/22/2018 and underwent Procedure(s): TOTAL KNEE ARTHROPLASTY.  Patient tolerated the procedure well and was later transferred to the recovery room and then to the orthopaedic floor for postoperative care. They were given PO and IV analgesics for pain control following their surgery. They were given 24 hours of postoperative antibiotics of  Anti-infectives (From admission, onward)   Start     Dose/Rate Route Frequency Ordered Stop   09/22/18 1400  ceFAZolin (ANCEF) IVPB 2g/100 mL premix     2 g 200 mL/hr over 30 Minutes Intravenous Every 6 hours 09/22/18 1045 09/22/18 2132   09/22/18 0827  vancomycin (VANCOCIN) powder  Status:  Discontinued       As needed 09/22/18 0827 09/22/18 0924   09/22/18 0600  ceFAZolin (ANCEF) IVPB 2g/100 mL premix     2 g 200 mL/hr over 30 Minutes Intravenous On call to O.R. 09/22/18 0532 09/22/18 0730     and started on DVT prophylaxis in the form of Aspirin.   PT and OT were ordered for total joint protocol. Discharge planning consulted to help with postop disposition and equipment needs.  Patient had a good night on the evening of surgery. They started to get up OOB with therapy on POD #0. Pt was seen during rounds and was ready to go home pending progress with therapy. She worked  with therapy on POD #1 and was meeting her goals. Pt was discharged to home later that day in stable condition.  Diet: Regular diet Activity: WBAT Follow-up: in 2 weeks Disposition: Home Discharged Condition: good   Discharge Instructions    Call MD / Call 911   Complete by: As directed    If you experience chest pain or shortness of breath, CALL 911 and be transported to the hospital emergency room.  If you develope a fever above 101 F, pus (white drainage) or increased drainage or redness at the wound, or calf pain, call your surgeon's office.   Call MD / Call 911   Complete by: As directed    If you experience chest pain or shortness of breath, CALL 911 and be transported to the hospital emergency room.  If you develope a fever above 101 F, pus (white drainage) or increased drainage or redness at the wound, or calf pain, call your surgeon's office.   Change dressing   Complete by: As directed    Maintain surgical dressing until follow up in the clinic. If the edges start to pull up, may reinforce with tape. If the dressing is no longer  working, may remove and cover with gauze and tape, but must keep the area dry and clean.  Call with any questions or concerns.   Change dressing   Complete by: As directed    Maintain surgical dressing until follow up in the clinic. If the edges start to pull up, may reinforce with tape. If the dressing is no longer working, may remove and cover with gauze and tape, but must keep the area dry and clean.  Call with any questions or concerns.   Constipation Prevention   Complete by: As directed    Drink plenty of fluids.  Prune juice may be helpful.  You may use a stool softener, such as Colace (over the counter) 100 mg twice a day.  Use MiraLax (over the counter) for constipation as needed.   Constipation Prevention   Complete by: As directed    Drink plenty of fluids.  Prune juice may be helpful.  You may use a stool softener, such as Colace (over the  counter) 100 mg twice a day.  Use MiraLax (over the counter) for constipation as needed.   Diet - low sodium heart healthy   Complete by: As directed    Diet - low sodium heart healthy   Complete by: As directed    Discharge instructions   Complete by: As directed    Maintain surgical dressing until follow up in the clinic. If the edges start to pull up, may reinforce with tape. If the dressing is no longer working, may remove and cover with gauze and tape, but must keep the area dry and clean.  Follow up in 2 weeks at [facility]. Call with any questions or concerns.   Increase activity slowly as tolerated   Complete by: As directed    Weight bearing as tolerated with assist device (walker, cane, etc) as directed, use it as long as suggested by your surgeon or therapist, typically at least 4-6 weeks.   Increase activity slowly as tolerated   Complete by: As directed    TED hose   Complete by: As directed    Use stockings (TED hose) for 2 weeks on both leg(s).  You may remove them at night for sleeping.   TED hose   Complete by: As directed    Use stockings (TED hose) for 2 weeks on both leg(s).  You may remove them at night for sleeping.     Allergies as of 09/23/2018      Reactions   Asa [aspirin]    Pt not allergic but cannot take due to bleeding    Other    Any jewelry metal-hives and itching       Medication List    STOP taking these medications   albuterol 108 (90 Base) MCG/ACT inhaler Commonly known as: VENTOLIN HFA   Calcium 600+D 600-800 MG-UNIT Tabs Generic drug: Calcium Carb-Cholecalciferol   chlorpheniramine-HYDROcodone 10-8 MG/5ML Suer Commonly known as: Tussionex Pennkinetic ER   Cyanocobalamin 1000 MCG/ML Kit   guaiFENesin-codeine 100-10 MG/5ML syrup Commonly known as: ROBITUSSIN AC   ibuprofen 200 MG tablet Commonly known as: ADVIL   OMEGA-3 1450 PO     TAKE these medications   ALPRAZolam 0.25 MG tablet Commonly known as: XANAX Take  0.25 mg by mouth 2 (two) times daily.   aspirin 81 MG chewable tablet Chew 1 tablet (81 mg total) by mouth 2 (two) times daily for 28 days.   atenolol 50 MG tablet Commonly known as: TENORMIN Take 50 mg  by mouth daily.   Basaglar KwikPen 100 UNIT/ML Sopn Inject 65 Units into the skin daily.   Cartia XT 180 MG 24 hr capsule Generic drug: diltiazem Take 180 mg by mouth daily.   celecoxib 200 MG capsule Commonly known as: CELEBREX Take 1 capsule (200 mg total) by mouth 2 (two) times daily.   cephALEXin 500 MG capsule Commonly known as: KEFLEX Take 1 capsule (500 mg total) by mouth 3 (three) times daily for 10 days.   citalopram 40 MG tablet Commonly known as: CELEXA Take 40 mg by mouth daily.   ferrous sulfate 325 (65 FE) MG tablet Take 1 tablet (325 mg total) by mouth 2 (two) times daily with a meal for 14 days. What changed: when to take this   fluticasone 50 MCG/ACT nasal spray Commonly known as: FLONASE Place 1 spray into both nostrils daily.   glipiZIDE 5 MG tablet Commonly known as: GLUCOTROL Take 5 mg by mouth 2 (two) times daily.   hydrochlorothiazide 12.5 MG capsule Commonly known as: MICROZIDE Take 12.5 mg by mouth daily.   HYDROcodone-acetaminophen 7.5-325 MG tablet Commonly known as: NORCO Take 1-2 tablets by mouth every 4 (four) hours as needed for severe pain (pain score 7-10).   loratadine 10 MG tablet Commonly known as: CLARITIN Take 10 mg by mouth daily.   methocarbamol 500 MG tablet Commonly known as: ROBAXIN Take 1 tablet (500 mg total) by mouth every 6 (six) hours as needed for muscle spasms.   montelukast 10 MG tablet Commonly known as: SINGULAIR Take 10 mg by mouth at bedtime.   simvastatin 40 MG tablet Commonly known as: ZOCOR Take 40 mg by mouth at bedtime.   sitaGLIPtin-metformin 50-500 MG tablet Commonly known as: JANUMET Take 1 tablet by mouth 2 (two) times daily with a meal.            Discharge Care Instructions    (From admission, onward)         Start     Ordered   09/22/18 0000  Change dressing    Comments: Maintain surgical dressing until follow up in the clinic. If the edges start to pull up, may reinforce with tape. If the dressing is no longer working, may remove and cover with gauze and tape, but must keep the area dry and clean.  Call with any questions or concerns.   09/22/18 1707   09/22/18 0000  Change dressing    Comments: Maintain surgical dressing until follow up in the clinic. If the edges start to pull up, may reinforce with tape. If the dressing is no longer working, may remove and cover with gauze and tape, but must keep the area dry and clean.  Call with any questions or concerns.   09/22/18 1707         Follow-up Information    [facility]. Go on 09/26/2018.   Why: You are scheduled for physical therapy appointment on 09-26-18 at 10:00 am.       Danae Orleans, PA-C. Go on 10/06/2018.   Specialty: Orthopedic Surgery Why: You are scheduled for a post-operative appointment on 10-06-18 at 2:30 pm.  Contact information: [address] [place] [address] [phone]           Signed: Griffith Citron, PA-C Orthopedic Surgery 09/28/2018, 3:20 PM

## 2018-09-28 NOTE — Progress Notes (Signed)
Patient had a large BM (from lactulose medication). Shortly after nurse tech and I cleaned up pt, pt started c/o of not being able to breathe. Pt was trying to clear her throat and was unable to catch her breath. Rapid response notified immediately at the 5 am hour.       I explained to rapid response nurse that I made the judgment to hold the patient's evening Xanax due to RR of 8 breaths per min during 2 of the Q1 vital signs checks. Soft music was played in the room as non-pharmacological intervention to help distract/calm the pt.       As patient's respiration rate went back up during vital sign checks, pain meds were given as pain relief intervention.       Rapid response nurse requested that Xanax (next dose due at 10 am) be given early.      At 6:05 am, I notified the on call PA to explain what was going on with pt. On call PA called back/returned call 30 mins later. I requested an order for a one time dose of Xanax.       PA's response was to wait for Dr. Alvan Dame to do morning rounds and let him decide what to do about the Xanax.       Rapid response nurse was notified about the PA's response.      Continuing to monitor patient.

## 2018-09-28 NOTE — Significant Event (Signed)
Rapid Response Event Note  Overview: Time Called: 0507 Arrival Time: 0510 Event Type: Respiratory  Initial Focused Assessment: Patient lying in bed, HOB elevated. Patient complaining of not breathing right. Vital signs: HR 105 bpm, O2 100% on 2L [place], RR 16-19. Patient begins clearing her throat, stating that she cannot breath and feels like something is in her throat. Patient encouraged to breath in her nose and out her mouth. Patient is again able to talk to staff and list the medications she takes at home. Patient then begins asking if people have died in this hospital. Staff assured patient that she is being taken care of and we are hear to help her. Lung sounds are clear in the upper lobes and clear, diminished the bases. Primary RN, states that she held the patient's Xanax earlier in the night. Patient then expresses wishes to call her husband and does so.  Interventions: Attending MD notified and antianxiety one time dose requested. Air turned down to cool room and cold rag applied to forehead.  Oral care provided.  Plan of Care (if not transferred): Patient currently has continuous pulse oximetry in place, please continue at this time. Patient at ease at time of rapid response departure, although seems like she can quickly become anxious again.  Event Summary:  Brittany Russo

## 2018-09-28 NOTE — Evaluation (Signed)
Physical Therapy Evaluation Patient Details Name: Brittany Russo MRN: [id number] DOB: 09-May-1945 Today's Date: 09/28/2018   History of Present Illness  73 yo female adm with Right closed comminuted distal periprosthetic femur fracture with failure of implant. s/p R TKA revision on 09/27/18 per Dr. Alvan Dame. hx of DM, SZ, endometrial CA, and cirrhosis of the liver  Clinical Impression  Pt admitted with above diagnosis. Pt currently with functional limitations due to the deficits listed below (see PT Problem List).  Eval limited d/t pain and incontinence (bowels). Pt requiring max assist to roll in the bed, will attempt incr mobility again later today  Pt will benefit from skilled PT to increase their independence and safety with mobility to allow discharge to the venue listed below.       Follow Up Recommendations SNF;Follow surgeon's recommendation for DC plan and follow-up therapies;Supervision/Assistance - 24 hour    Equipment Recommendations  None recommended by PT    Recommendations for Other Services       Precautions / Restrictions Precautions Precautions: Fall;Knee Required Braces or Orthoses: Knee Immobilizer - Right Restrictions Weight Bearing Restrictions: No Other Position/Activity Restrictions: WBAT      Mobility  Bed Mobility Overal bed mobility: Needs Assistance Bed Mobility: Rolling Rolling: Max assist;+2 for safety/equipment         General bed mobility comments: pt incontinent of stool, assist to flex L knee and hip, hand over hand to reach and grasp rail to self assist  Transfers                 General transfer comment: NT at this time d/t pt incontinent of stool  Ambulation/Gait                Stairs            Wheelchair Mobility    Modified Rankin (Stroke Patients Only)       Balance                                             Pertinent Vitals/Pain Pain Assessment: Faces Faces Pain Scale: Hurts even  more Pain Location: R knee and left hip Pain Descriptors / Indicators: Aching;Sore;Grimacing Pain Intervention(s): Limited activity within patient's tolerance;Monitored during session;Premedicated before session;Repositioned    Home Living Family/patient expects to be discharged to:: Private residence Living Arrangements: Spouse/significant other Available Help at Discharge: Family Type of Home: Apartment Home Access: Level entry;Elevator     Home Layout: One level Home Equipment: Environmental consultant - 4 wheels      Prior Function Level of Independence: Independent with assistive device(s);Independent         Comments: prior to TKA 7/2     Hand Dominance        Extremity/Trunk Assessment   Upper Extremity Assessment Upper Extremity Assessment: Overall WFL for tasks assessed    Lower Extremity Assessment Lower Extremity Assessment: RLE deficits/detail RLE Deficits / Details: PROM ankle to neutral, knee extension and hip flexion 2-/5 RLE: Unable to fully assess due to pain       Communication   Communication: No difficulties  Cognition   Behavior During Therapy: [facility] for tasks assessed/performed   Area of Impairment: Memory;Following commands;Problem solving                     Memory: Decreased short-term memory;Decreased recall of precautions Following Commands:  Follows one step commands with increased time;Follows multi-step commands inconsistently     Problem Solving: Decreased initiation;Slow processing;Difficulty sequencing;Requires verbal cues;Requires tactile cues        General Comments      Exercises Total Joint Exercises Ankle Circles/Pumps: AROM;5 reps;Both   Assessment/Plan    PT Assessment Patient needs continued PT services  PT Problem List Decreased strength;Decreased range of motion;Decreased activity tolerance;Decreased balance;Pain;Obesity;Decreased mobility;Decreased cognition;Decreased knowledge of use of DME;Decreased safety  awareness       PT Treatment Interventions DME instruction;Gait training;Stair training;Functional mobility training;Therapeutic activities;Therapeutic exercise;Patient/family education    PT Goals (Current goals can be found in the Care Plan section)  Acute Rehab PT Goals PT Goal Formulation: With patient Time For Goal Achievement: 10/12/18 Potential to Achieve Goals: Good    Frequency Min 6X/week   Barriers to discharge        Co-evaluation               AM-PAC PT "6 Clicks" Mobility  Outcome Measure Help needed turning from your back to your side while in a flat bed without using bedrails?: Total Help needed moving from lying on your back to sitting on the side of a flat bed without using bedrails?: Total Help needed moving to and from a bed to a chair (including a wheelchair)?: Total Help needed standing up from a chair using your arms (e.g., wheelchair or bedside chair)?: Total Help needed to walk in hospital room?: Total Help needed climbing 3-5 steps with a railing? : Total 6 Click Score: 6    End of Session Equipment Utilized During Treatment: Right knee immobilizer Activity Tolerance: Patient limited by fatigue;Patient limited by pain Patient left: in bed;with bed alarm set;with call bell/phone within reach Nurse Communication: Mobility status PT Visit Diagnosis: Muscle weakness (generalized) (M62.81);Other abnormalities of gait and mobility (R26.89);History of falling (Z91.81)    Time: 8032-1224 PT Time Calculation (min) (ACUTE ONLY): 18 min   Charges:   PT Evaluation $PT Eval Low Complexity: 1 Low          Kenyon Ana, PT  Pager: [phone] Acute Rehab Dept [facility]): [phone]   09/28/2018   [facility] 09/28/2018, 12:55 PM

## 2018-09-28 NOTE — Progress Notes (Signed)
Subjective: 1 Day Post-Op Procedure(s) (LRB): TOTAL KNEE REVISION (Right) Patient reports pain as mild.   Patient seen in rounds for Dr. Alvan Dame. Patient is doing fair this morning. She appears anxious this morning on exam. She states she wants to go home. Per nursing notes, she complained of difficulty breathing this morning and rapid response was called. O2 saturation of 100% and RR of 16-19. Patient's Xanax had been held due to low respiratory rate.    Objective: Vital signs in last 24 hours: Temp:  [97.6 F (36.4 C)-99.5 F (37.5 C)] 98.3 F (36.8 C) (07/08 0502) Pulse Rate:  [85-106] 106 (07/08 0535) Resp:  [8-22] 18 (07/08 0535) BP: (117-182)/(54-101) 148/54 (07/08 0502) SpO2:  [97 %-100 %] 100 % (07/08 0535) Weight:  [95.1 kg] 95.1 kg (07/07 1339)  Intake/Output from previous day:  Intake/Output Summary (Last 24 hours) at 09/28/2018 0944 Last data filed at 09/28/2018 0648 Gross per 24 hour  Intake 2320 ml  Output 1221 ml  Net 1099 ml     Intake/Output this shift: No intake/output data recorded.  Labs: Recent Labs    09/25/18 1119 09/26/18 0818 09/27/18 0317 09/27/18 1916 09/28/18 0334  HGB 8.2* 8.0* 8.1* 9.0* 9.3*   Recent Labs    09/27/18 0317 09/27/18 1916 09/28/18 0334  WBC 4.8  --  10.5  RBC 2.75*  --  3.19*  3.19*  HCT 24.3* 27.3* 29.0*  PLT 71*  --  114*   Recent Labs    09/27/18 0317 09/28/18 0334  NA 130* 132*  K 3.5 4.7  CL 97* 102  CO2 26 22  BUN 16 25*  CREATININE 0.44 0.90  GLUCOSE 134* 270*  CALCIUM 7.8* 7.7*   No results for input(s): LABPT, INR in the last 72 hours.  Exam: General - Patient is Alert and Oriented Extremity - Neurologically intact Sensation intact distally Intact pulses distally Dorsiflexion/Plantar flexion intact Dressing - dressing C/D/I Motor Function - intact, moving foot and toes well on exam.   Past Medical History:  Diagnosis Date  . Allergy   . Anxiety   . Arthritis   . Cirrhosis of liver (HCC)     nonalcoholic per patient , reports she takes a medication that makes her have bowel movements  to treat her liver   . Depression   . Diabetes mellitus without complication ([place])    type 2  . Eczema of both hands   . Endometrial cancer ([place]) 2005   treated with radiation therapy    . Headache   . Hyperlipidemia   . Hypertension   . Seizures ([place])    last seizure June 07, 2014   . Thyroid nodule   . Vaginal cancer (HCC)     Assessment/Plan: 1 Day Post-Op Procedure(s) (LRB): TOTAL KNEE REVISION (Right) Active Problems:   Periprosthetic fracture around internal prosthetic right knee joint  Estimated body mass index is 38.35 kg/m as calculated from the following:   Height as of this encounter: 5' 2"  (1.575 m).   Weight as of this encounter: 95.1 kg. Advance diet Up with therapy  Anticipated LOS equal to or greater than 2 midnights due to - Age 39 and older with one or more of the following:  - Obesity  - Expected need for hospital services (PT, OT, Nursing) required for safe  discharge  - Anticipated need for postoperative skilled nursing care or inpatient rehab  - Active co-morbidities: Advanced Liver Disease OR   - Unanticipated findings during/Post Surgery: None  -  Patient is a high risk of re-admission due to: None    DVT Prophylaxis - Aspirin, which we will resume on 7/9 Weight bearing as tolerated. Remain in knee immobilizer. D/C O2 and pulse ox and try on room air. Hemovac drain to remain in place today.  Patient appears anxious on exam today. This has been a difficult week, and she expresses that she is anxious to get home. Right knee to remain in immobilizer until follow up, with no ROM of the knee. Hemoglobin of 9.3 today. Continue working with PT on transfers and gait training.  Patient has significant anxiety, but has also had two rapid response events due to somnolence and difficulty breathing. Per nursing, husband has reported that patient frequently over  utilizes Xanax. Continue to hold Xanax until medicine service is comfortable with resuming.   We appreciate continued support from Hospitalist service regarding multiple medical co-morbidities.   Griffith Citron, PA-C Orthopedic Surgery 09/28/2018, 9:44 AM

## 2018-09-29 ENCOUNTER — Inpatient Hospital Stay (HOSPITAL_COMMUNITY): Payer: Medicare Other

## 2018-09-29 LAB — CBC
HCT: 34.7 % — ABNORMAL LOW (ref 36.0–46.0)
Hemoglobin: 11.1 g/dL — ABNORMAL LOW (ref 12.0–15.0)
MCH: 26.3 pg (ref 26.0–34.0)
MCHC: 32 g/dL (ref 30.0–36.0)
MCV: 82.2 fL (ref 80.0–100.0)
Platelets: 282 10*3/uL (ref 150–400)
RBC: 4.22 MIL/uL (ref 3.87–5.11)
RDW: 14.9 % (ref 11.5–15.5)
WBC: 10.7 10*3/uL — ABNORMAL HIGH (ref 4.0–10.5)
nRBC: 0 % (ref 0.0–0.2)

## 2018-09-29 LAB — BASIC METABOLIC PANEL
Anion gap: 10 (ref 5–15)
BUN: 59 mg/dL — ABNORMAL HIGH (ref 8–23)
CO2: 19 mmol/L — ABNORMAL LOW (ref 22–32)
Calcium: 8.4 mg/dL — ABNORMAL LOW (ref 8.9–10.3)
Chloride: 112 mmol/L — ABNORMAL HIGH (ref 98–111)
Creatinine, Ser: 2.84 mg/dL — ABNORMAL HIGH (ref 0.44–1.00)
GFR calc Af Amer: 19 mL/min — ABNORMAL LOW (ref >60)
GFR calc non Af Amer: 16 mL/min — ABNORMAL LOW (ref >60)
Glucose, Bld: 130 mg/dL — ABNORMAL HIGH (ref 70–99)
Potassium: 5.4 mmol/L — ABNORMAL HIGH (ref 3.5–5.1)
Sodium: 141 mmol/L (ref 135–145)

## 2018-09-29 LAB — RENAL FUNCTION PANEL
Albumin: 3.7 g/dL (ref 3.5–5.0)
Anion gap: 11 (ref 5–15)
BUN: 27 mg/dL — ABNORMAL HIGH (ref 8–23)
CO2: 21 mmol/L — ABNORMAL LOW (ref 22–32)
Calcium: 8.8 mg/dL — ABNORMAL LOW (ref 8.9–10.3)
Chloride: 104 mmol/L (ref 98–111)
Creatinine, Ser: 0.79 mg/dL (ref 0.44–1.00)
GFR calc Af Amer: >60 mL/min (ref >60)
GFR calc non Af Amer: >60 mL/min (ref >60)
Glucose, Bld: 205 mg/dL — ABNORMAL HIGH (ref 70–99)
Phosphorus: 3.7 mg/dL (ref 2.5–4.6)
Potassium: 3.9 mmol/L (ref 3.5–5.1)
Sodium: 136 mmol/L (ref 135–145)

## 2018-09-29 LAB — GLUCOSE, CAPILLARY
Glucose-Capillary: 207 mg/dL — ABNORMAL HIGH (ref 70–99)
Glucose-Capillary: 148 mg/dL — ABNORMAL HIGH (ref 70–99)

## 2018-09-29 LAB — MAGNESIUM: Magnesium: 2.4 mg/dL (ref 1.7–2.4)

## 2018-09-29 MED ORDER — SODIUM CHLORIDE 0.9 % IV SOLN
INTRAVENOUS | Status: DC
  Administered 2018-09-29 (×2): via INTRAVENOUS

## 2018-09-29 NOTE — Progress Notes (Signed)
Patient ID: Brittany Russo, female   DOB: 1945-12-16, 73 y.o.   MRN: [id number] Subjective: 2 Days Post-Op Procedure(s) (LRB): TOTAL KNEE REVISION (Right)    Patient reports pain as moderate. Complaining of left hip pain in addition to right knee/leg pain No other events Appreciate medical team input and treatment  Objective:   VITALS:   Vitals:   09/28/18 2129 09/29/18 0406  BP: (!) 147/61 (!) 149/58  Pulse: 72 67  Resp: 16 16  Temp: (!) 97.5 F (36.4 C) 98 F (36.7 C)  SpO2: 92% 99%    Neurovascular intact Incision: dressing C/D/I  BLE edema  LABS Recent Labs    09/27/18 0317 09/27/18 1916 09/28/18 0334 09/29/18 0320  HGB 8.1* 9.0* 9.3* 11.1*  HCT 24.3* 27.3* 29.0* 34.7*  WBC 4.8  --  10.5 10.7*  PLT 71*  --  114* 282    Recent Labs    09/27/18 0317 09/28/18 0334 09/29/18 0320  NA 130* 132* 143  141  K 3.5 4.7 5.5*  5.4*  BUN 16 25* 62*  59*  CREATININE 0.44 0.90 2.87*  2.84*  GLUCOSE 134* 270* 128*  130*    No results for input(s): LABPT, INR in the last 72 hours.   Assessment/Plan: 2 Days Post-Op Procedure(s) (LRB): TOTAL KNEE REVISION (Right)   Advance diet   Hyperkalemia per medicine Hepatic related encephalopathy per medicine DM per medicine  Portable X-ray of pelvis to rule out injury to left hip when she fell at home Portable X-ray of right knee to confirm no peri-operative injury  Knee immobilizer to right knee WBAT RLE to help with at least transfers to chair  Discharge pending medical stabilization and functional assessment of needs

## 2018-09-29 NOTE — Plan of Care (Signed)

## 2018-09-29 NOTE — Progress Notes (Addendum)
Physical Therapy Treatment Patient Details Name: Brittany Russo MRN: [id number] DOB: [birth year] Today's Date: 09/29/2018    History of Present Illness 73 yo female adm with Right closed comminuted distal periprosthetic femur fracture with failure of implant. s/p R TKA revision on 09/27/18 per Dr. Alvan Dame. hx of DM, SZ, endometrial CA, and cirrhosis of the liver    PT Comments    +2 max assist for supine to sit and for sit to stand with RW and R KI. Pt unable to weight shift in standing, pt had posterior lean, required +2 mod assist for standing balance. Unable to stand pivot transfer so assisted pt onto bedpan. Mechanical lift recommended for transfers. Pt reports significant pain in R knee and L hip, noted L hip xray was negative this morning.  ST-SNF recommended.    Follow Up Recommendations  SNF;Supervision/Assistance - 24 hour     Equipment Recommendations  None recommended by PT    Recommendations for Other Services       Precautions / Restrictions Precautions Precautions: Fall;Knee Precaution Comments: No ROM to R knee Required Braces or Orthoses: Knee Immobilizer - Right Knee Immobilizer - Right: On at all times Restrictions Weight Bearing Restrictions: No Other Position/Activity Restrictions: WBAT    Mobility  Bed Mobility Overal bed mobility: Needs Assistance Bed Mobility: Rolling Rolling: Max assist;+2 for safety/equipment         General bed mobility comments: rolled L for placement of bedpan  Transfers Overall transfer level: Needs assistance Equipment used: Rolling walker (2 wheeled) Transfers: Sit to/from Stand Sit to Stand: +2 physical assistance;Max assist;From elevated surface         General transfer comment: VCs hand placement, max A to rise and steady, posterior lean in standing, pt unable to weight shift in standing 2* L hip and R knee pain, R KI on, pt stood ~ 60 seconds, tolerance limited by pain  Ambulation/Gait                  Stairs             Wheelchair Mobility    Modified Rankin (Stroke Patients Only)       Balance Overall balance assessment: Needs assistance Sitting-balance support: No upper extremity supported;Feet supported Sitting balance-Leahy Scale: Fair   Postural control: Posterior lean Standing balance support: Bilateral upper extremity supported Standing balance-Leahy Scale: Poor Standing balance comment: heavily reliant on UEs throughout session for stability                            Cognition Arousal/Alertness: Awake/alert Behavior During Therapy: WFL for tasks assessed/performed;Anxious Overall Cognitive Status: Within Functional Limits for tasks assessed                                        Exercises Total Joint Exercises Ankle Circles/Pumps: AROM;Both;10 reps;Supine    General Comments        Pertinent Vitals/Pain Pain Score: 9  Pain Location: L hip and R knee Pain Descriptors / Indicators: Sharp;Sore Pain Intervention(s): Limited activity within patient's tolerance;Monitored during session;Premedicated before session;Ice applied    Home Living                      Prior Function            PT Goals (current goals can now be found  in the care plan section) Acute Rehab PT Goals Patient Stated Goal: be able to walk PT Goal Formulation: With patient Time For Goal Achievement: 09/29/18 Potential to Achieve Goals: Good Progress towards PT goals: Progressing toward goals    Frequency    7X/week      PT Plan Current plan remains appropriate    Co-evaluation              AM-PAC PT "6 Clicks" Mobility   Outcome Measure  Help needed turning from your back to your side while in a flat bed without using bedrails?: A Lot Help needed moving from lying on your back to sitting on the side of a flat bed without using bedrails?: A Lot Help needed moving to and from a bed to a chair (including a wheelchair)?:  Total Help needed standing up from a chair using your arms (e.g., wheelchair or bedside chair)?: Total Help needed to walk in hospital room?: Total Help needed climbing 3-5 steps with a railing? : Total 6 Click Score: 8    End of Session Equipment Utilized During Treatment: Gait belt;Right knee immobilizer Activity Tolerance: Patient limited by pain Patient left: in bed;with call bell/phone within reach Nurse Communication: Mobility status(pt on bedpan) PT Visit Diagnosis: Unsteadiness on feet (R26.81);Difficulty in walking, not elsewhere classified (R26.2);History of falling (Z91.81);Muscle weakness (generalized) (M62.81);Pain Pain - Right/Left: Right Pain - part of body: Knee     Time: 7121-9758 PT Time Calculation (min) (ACUTE ONLY): 23 min  Charges:  $Therapeutic Activity: 23-37 mins                     Blondell Reveal Kistler PT 09/29/2018  Acute Rehabilitation Services Pager [phone] Office [phone]

## 2018-09-29 NOTE — Progress Notes (Addendum)
Brittany Russo  PROGRESS NOTE    EMMANUELLE HIBBITTS  KPV:[id number] DOB: 12/24/1945 DOA: 09/24/2018 PCP: Rosalee Kaufman, PA-C   Brief Narrative:   73 year old with past medical history significant for depression, anxiety, cirrhosis of the liver nonalcoholic, depression, endometrial cancer, history of hypertension, diabetes, history of seizure, history of vaginal cancer who presents to the hospital complaining of right knee pain and is swelling seen her recent right total knee arthroplasty. She was discharged home following surgery when she was arriving at home she had 2 falls leaving the hospital and getting out of her car. She presented back to the emergency department and was found to have a periprosthetic femur fracture with subluxation of total right knee arthroplasty. Patient was supposed to go to the OR the morning of 6 July 5 but she was found to be very encephalopathic and somnolent, code stroke was called and telemetry neurology evaluated patient. [facility] was consulted to help with medical management.   Assessment & Plan:   Active Problems:   DM2 (diabetes mellitus, type 2) ([place])   Hypertension   Hyperlipidemia   Periprosthetic fracture around internal prosthetic right knee joint   Depression   Acute hepatic encephalopathy   Non-alcoholic cirrhosis ([place])   acute hepatic encephalopathy     - Patient was somnolent, right hand weakness and tremors.     - Ammonia was elevated.     - Code stroke was activated on 7/5.  CT head was negative for acute intracranial bleed.     - Tele-neurology was consulted, Dr. Graylon Good did not recommend thrombolytic therapy or emergent vascular imaging.  Patient presentation likely related to hepatic encephalopathy.     - EEG awaiting results     - lactulose was resumed; ammonia improved, pt is A&O x 3     - apparently she is non-compliant w/ lactulose at home d/t diarrhea; currently complaining or sore bottom and D, will decrease lactulose frequency     - she  again is A&O x 3 this morning; we can continue lactulose at current dosing for now (she reports diarrhea is a little better)  Cirrhosis: Nonalcoholic     - Abdominal ultrasound: Mild ascites     - Mild elevation of AST, bilirubin 1.3.  Anemia, normocytic     - no bleeds noted     - Hgb stable at 9.3     - continue iron supplement  Hyponatremia, hypochloremia     - Agree with holding hydrochlorothiazide     - Continue with IV fluids.     - resolved     - continue candesartan at discharge   Thrombocytopenia:     - Related to cirrhosis. Stable.  Diabetes     - Continue to hold linagliptin, metformin and glipizide     - Sliding scale insulin     - Continue with Lantus  Hypertension     - Continue with diltiazem, atenolol.     - can continue candesartan at discharge  Depression, anxiety     - continue citalopram  Obesity    - BMI 38.35     - diet, exercise counseled  Her labs this morning don't make sense. I have asked for a re-draw and re-run of that blood work. Should her SCr remain elevated after re-test, we will address w/ fluids and consider neprho consult. Hold off on K+ treatment until we see new labs. Spoke with phlebotomy about re-draw at 1220 hrs. She is otherwise stable today. Remainder per primary  team.  Repeat labs ok. K+, SCr normal. Continue lactulose at current dosing. Can continue candesartan on discharge. As above otherwise.  Code Status: FULL   Disposition Plan: Per primary   Subjective: "How much of that am I taking now?"  Objective: Vitals:   09/28/18 1049 09/28/18 1318 09/28/18 2129 09/29/18 0406  BP:  (!) 140/49 (!) 147/61 (!) 149/58  Pulse: (!) 105 82 72 67  Resp:  16 16 16   Temp:  98.1 F (36.7 C) (!) 97.5 F (36.4 C) 98 F (36.7 C)  TempSrc:   Oral   SpO2:  100% 92% 99%  Weight:      Height:        Intake/Output Summary (Last 24 hours) at 09/29/2018 1220 Last data filed at 09/29/2018 1100 Gross per 24 hour  Intake 960 ml   Output 830 ml  Net 130 ml   Filed Weights   09/24/18 0450 09/27/18 1339  Weight: 95.1 kg 95.1 kg    Examination:  General: 73 y.o. female resting in bed in NAD Cardiovascular: RRR, +S1, S2, no m/g/r, equal pulses throughout Respiratory: CTABL, no w/r/r, normal WOB GI: BS+, NDNT, no masses noted, no organomegaly noted MSK: No e/c/c Skin: No rashes, bruises, ulcerations noted Neuro: A&O x 3, no focal deficits    Data Reviewed: I have personally reviewed following labs and imaging studies.  CBC: Recent Labs  Lab 09/24/18 0308 09/25/18 1119 09/26/18 0818 09/27/18 0317 09/27/18 1916 09/28/18 0334 09/29/18 0320  WBC 13.4* 7.9 7.6 4.8  --  10.5 10.7*  NEUTROABS 10.4*  --  5.5  --   --   --   --   HGB 8.6* 8.2* 8.0* 8.1* 9.0* 9.3* 11.1*  HCT 26.4* 24.2* 25.2* 24.3* 27.3* 29.0* 34.7*  MCV 88.6 87.7 89.4 88.4  --  90.9 82.2  PLT 120* 85* 94* 71*  --  114* 086   Basic Metabolic Panel: Recent Labs  Lab 09/25/18 1119 09/26/18 0818 09/27/18 0317 09/28/18 0334 09/29/18 0320  NA 126* 127* 130* 132* 143  141  K 3.8 3.8 3.5 4.7 5.5*  5.4*  CL 93* 94* 97* 102 111  112*  CO2 22 21* 26 22 17*  19*  GLUCOSE 195* 225* 134* 270* 128*  130*  BUN 33* 24* 16 25* 62*  59*  CREATININE 0.94 0.73 0.44 0.90 2.87*  2.84*  CALCIUM 8.2* 7.9* 7.8* 7.7* 8.5*  8.4*  MG  --  2.0  --   --  2.4  PHOS  --  2.7  --   --  4.0   GFR: Estimated Creatinine Clearance: 19.8 mL/min (A) (by C-G formula based on SCr of 2.84 mg/dL (H)). Liver Function Tests: Recent Labs  Lab 09/25/18 1119 09/26/18 0818 09/29/18 0320  AST 37 50*  --   ALT 22 26  --   ALKPHOS 140* 136*  --   BILITOT 1.2 1.3*  --   PROT 5.7* 5.4*  --   ALBUMIN 2.9* 2.6* 3.4*   No results for input(s): LIPASE, AMYLASE in the last 168 hours. Recent Labs  Lab 09/25/18 1119 09/26/18 0814  AMMONIA 66* 41*   Coagulation Profile: No results for input(s): INR, PROTIME in the last 168 hours. Cardiac Enzymes: No results for  input(s): CKTOTAL, CKMB, CKMBINDEX, TROPONINI in the last 168 hours. BNP (last 3 results) No results for input(s): PROBNP in the last 8760 hours. HbA1C: No results for input(s): HGBA1C in the last 72 hours. CBG: Recent Labs  Lab  09/28/18 0510 09/28/18 0726 09/28/18 1143 09/28/18 1620 09/28/18 2131  GLUCAP 251* 280* 316* 174* 100*   Lipid Profile: No results for input(s): CHOL, HDL, LDLCALC, TRIG, CHOLHDL, LDLDIRECT in the last 72 hours. Thyroid Function Tests: No results for input(s): TSH, T4TOTAL, FREET4, T3FREE, THYROIDAB in the last 72 hours. Anemia Panel: Recent Labs    09/28/18 0334  VITAMINB12 386  FOLATE 13.3  FERRITIN 32  TIBC 356  IRON 62  RETICCTPCT 5.5*   Sepsis Labs: No results for input(s): PROCALCITON, LATICACIDVEN in the last 168 hours.  Recent Results (from the past 240 hour(s))  Surgical pcr screen     Status: None   Collection Time: 09/19/18  1:34 PM   Specimen: Nasal Mucosa; Nasal Swab  Result Value Ref Range Status   MRSA, PCR NEGATIVE NEGATIVE Final   Staphylococcus aureus NEGATIVE NEGATIVE Final    Comment: (NOTE) The Xpert SA Assay (FDA approved for NASAL specimens in patients 33 years of age and older), is one component of a comprehensive surveillance program. It is not intended to diagnose infection nor to guide or monitor treatment. Performed at [facility], [place] [address], [place], [place] [address]   SARS Coronavirus 2 (Performed in [facility] hospital lab)     Status: None   Collection Time: 09/19/18  2:58 PM   Specimen: Nasal Swab  Result Value Ref Range Status   SARS Coronavirus 2 NEGATIVE NEGATIVE Final    Comment: (NOTE) SARS-CoV-2 target nucleic acids are NOT DETECTED. The SARS-CoV-2 RNA is generally detectable in upper and lower respiratory specimens during the acute phase of infection. Negative results do not preclude SARS-CoV-2 infection, do not rule out co-infections with other pathogens, and should  not be used as the sole basis for treatment or other patient management decisions. Negative results must be combined with clinical observations, patient history, and epidemiological information. The expected result is Negative. Fact Sheet for Patients: SugarRoll.be Fact Sheet for Healthcare Providers: https://www.woods-mathews.com/ This test is not yet approved or cleared by the [place] FDA and  has been authorized for detection and/or diagnosis of SARS-CoV-2 by FDA under an Emergency Use Authorization (EUA). This EUA will remain  in effect (meaning this test can be used) for the duration of the COVID-19 declaration under Section 56 4(b)(1) of the Act, 21 U.S.C. section 360bbb-3(b)(1), unless the authorization is terminated or revoked sooner. Performed at [facility], [place] [address], [place], [place] [address]   Urine Culture     Status: Abnormal   Collection Time: 09/22/18  7:18 AM   Specimen: Urine, Clean Catch  Result Value Ref Range Status   Specimen Description   Final    URINE, CLEAN CATCH Performed at [facility], [place] [address], [place], [place] [address]    Special Requests   Final    NONE Performed at [facility], [place] [address], [place], [place] [address]    Culture (A)  Final    <10,000 COLONIES/mL INSIGNIFICANT GROWTH Performed at [place] [address] [place], [place] [address]    Report Status 09/23/2018 FINAL  Final  SARS Coronavirus 2 (CEPHEID - Performed in [place] hospital lab), Hosp Order     Status: None   Collection Time: 09/24/18  8:13 AM   Specimen: Nasopharyngeal Swab  Result Value Ref Range Status   SARS Coronavirus 2 NEGATIVE NEGATIVE Final    Comment: (NOTE) If result is NEGATIVE SARS-CoV-2 target nucleic acids  are NOT DETECTED. The SARS-CoV-2 RNA is generally detectable in upper and lower  respiratory specimens during the acute phase  of infection. The lowest  concentration of SARS-CoV-2 viral copies this assay can detect is 250  copies / mL. A negative result does not preclude SARS-CoV-2 infection  and should not be used as the sole basis for treatment or other  patient management decisions.  A negative result may occur with  improper specimen collection / handling, submission of specimen other  than nasopharyngeal swab, presence of viral mutation(s) within the  areas targeted by this assay, and inadequate number of viral copies  (<250 copies / mL). A negative result must be combined with clinical  observations, patient history, and epidemiological information. If result is POSITIVE SARS-CoV-2 target nucleic acids are DETECTED. The SARS-CoV-2 RNA is generally detectable in upper and lower  respiratory specimens dur ing the acute phase of infection.  Positive  results are indicative of active infection with SARS-CoV-2.  Clinical  correlation with patient history and other diagnostic information is  necessary to determine patient infection status.  Positive results do  not rule out bacterial infection or co-infection with other viruses. If result is PRESUMPTIVE POSTIVE SARS-CoV-2 nucleic acids MAY BE PRESENT.   A presumptive positive result was obtained on the submitted specimen  and confirmed on repeat testing.  While 2019 novel coronavirus  (SARS-CoV-2) nucleic acids may be present in the submitted sample  additional confirmatory testing may be necessary for epidemiological  and / or clinical management purposes  to differentiate between  SARS-CoV-2 and other Sarbecovirus currently known to infect humans.  If clinically indicated additional testing with an alternate test  methodology [phone]) is advised. The SARS-CoV-2 RNA is generally  detectable in upper and lower respiratory sp ecimens during the acute  phase of infection. The expected result is Negative. Fact Sheet for Patients:   StrictlyIdeas.no Fact Sheet for Healthcare Providers: BankingDealers.co.za This test is not yet approved or cleared by the [place] FDA and has been authorized for detection and/or diagnosis of SARS-CoV-2 by FDA under an Emergency Use Authorization (EUA).  This EUA will remain in effect (meaning this test can be used) for the duration of the COVID-19 declaration under Section 564(b)(1) of the Act, 21 U.S.C. section 360bbb-3(b)(1), unless the authorization is terminated or revoked sooner. Performed at [facility], [place] [address], [place], [place] [address]   Surgical pcr screen     Status: None   Collection Time: 09/26/18 12:10 AM   Specimen: Nasal Mucosa; Nasal Swab  Result Value Ref Range Status   MRSA, PCR NEGATIVE NEGATIVE Final   Staphylococcus aureus NEGATIVE NEGATIVE Final    Comment: (NOTE) The Xpert SA Assay (FDA approved for NASAL specimens in patients 20 years of age and older), is one component of a comprehensive surveillance program. It is not intended to diagnose infection nor to guide or monitor treatment. Performed at [facility], [place] [address] [place], [place] [address]          Radiology Studies: Dg Pelvis Portable  Result Date: 09/29/2018 CLINICAL DATA:  Left hip pain since right knee surgery 2 days ago. EXAM: PORTABLE PELVIS 1-2 VIEWS COMPARISON:  05/25/2018. FINDINGS: The left hip continues to have a normal appearance, as does the right hip. Lower lumbar spine degenerative changes are again noted. No fracture or dislocation seen. A single, tiny surgical clip or radiation seed is again noted in the inferior pelvis. IMPRESSION: No acute abnormality. Electronically  Signed   By: Claudie Revering M.D.   On: 09/29/2018 09:33   Dg Knee Right Port  Result Date: 09/29/2018 CLINICAL DATA:  Diffuse right leg pain since right knee surgery 2 days ago. EXAM: PORTABLE RIGHT KNEE - 1-2  VIEW COMPARISON:  Right knee CT and radiographs dated 09/24/2018. FINDINGS: The previously demonstrated right knee prosthesis has been replaced with surgical absence of the previously demonstrated distal femur fracture. The prosthetic components are in anatomic position and alignment. There is associated intra-articular postoperative air and a surgical drain. No acute fracture or dislocation is seen. IMPRESSION: Postoperative changes, as described above. No acute abnormality. Electronically Signed   By: Claudie Revering M.D.   On: 09/29/2018 09:35        Scheduled Meds: . sodium chloride   Intravenous Once  . ALPRAZolam  0.25 mg Oral BID  . aspirin  81 mg Oral BID  . atenolol  50 mg Oral Daily  . cefUROXime  250 mg Oral BID  . celecoxib  200 mg Oral BID  . citalopram  40 mg Oral Daily  . dexamethasone (DECADRON) injection  10 mg Intravenous Once  . diltiazem  180 mg Oral Daily  . docusate sodium  100 mg Oral BID  . ferrous sulfate  325 mg Oral BID WC  . fluticasone  1 spray Each Nare Daily  . Gerhardt's butt cream   Topical QID  . insulin aspart  0-15 Units Subcutaneous TID WC  . insulin aspart  0-5 Units Subcutaneous QHS  . insulin glargine  65 Units Subcutaneous Daily  . lactulose  20 g Oral BID  . loratadine  10 mg Oral Daily  . montelukast  10 mg Oral QHS  . polyethylene glycol  17 g Oral BID  . potassium chloride  10 mEq Oral Daily  . simvastatin  40 mg Oral QHS   Continuous Infusions: . sodium chloride 75 mL/hr at 09/29/18 0941  . methocarbamol (ROBAXIN) IV       LOS: 5 days    Time spent: 25 minutes spent in the coordination of care today.    Jonnie Finner, DO [facility] Pager [phone]  If 7PM-7AM, please contact night-coverage [email or web address] Password [email or web address] 09/29/2018, 12:20 PM

## 2018-09-29 NOTE — NC FL2 (Addendum)
[place] LEVEL OF CARE SCREENING TOOL     IDENTIFICATION  Patient Name: Brittany Russo Birthdate: 25-Dec-1945 Sex: female Admission Date (Current Location): 09/24/2018  [facility] and [place] Number:  Herbalist and Address:  [facility],  [place] [address], [place]      Provider Number: [id number]  Attending Physician Name and Address:  Paralee Cancel, MD  Relative Name and Phone Number:       Current Level of Care: Hospital Recommended Level of Care: [place] Prior Approval Number:    Date Approved/Denied:   PASRR Number:  pending   Discharge Plan: SNF    Current Diagnoses: Patient Active Problem List   Diagnosis Date Noted  . Depression 09/28/2018  . Acute hepatic encephalopathy 09/28/2018  . Non-alcoholic cirrhosis ([place]) 36/64/4034  . Periprosthetic fracture around internal prosthetic right knee joint 09/24/2018  . S/P right TKA 09/22/2018  . Status post total knee replacement, right 09/22/2018  . DM2 (diabetes mellitus, type 2) ([place]) 07/11/2015  . Hypertension 07/11/2015  . Hyperlipidemia 07/11/2015    Orientation RESPIRATION BLADDER Height & Weight     Self, Time, Situation, Place  Normal Continent Weight: 209 lb 10.5 oz (95.1 kg) Height:  5\' 2"  (157.5 cm)  BEHAVIORAL SYMPTOMS/MOOD NEUROLOGICAL BOWEL NUTRITION STATUS      Continent Diet(Carb Modified)  AMBULATORY STATUS COMMUNICATION OF NEEDS Skin   Extensive Assist Verbally Surgical wounds                       Personal Care Assistance Level of Assistance  Bathing, Feeding, Dressing Bathing Assistance: Maximum assistance Feeding assistance: Independent Dressing Assistance: Maximum assistance     Functional Limitations Info  Sight, Speech, Hearing Sight Info: Adequate Hearing Info: Adequate Speech Info: Adequate    SPECIAL CARE FACTORS FREQUENCY  PT (By licensed PT), OT (By licensed OT)     PT Frequency: 5x/week OT Frequency:  5x/week            Contractures Contractures Info: Not present    Additional Factors Info  Code Status, Allergies, Psychotropic   Insulin  Code Status Info: Fullcode Allergies Info: [place] Aspirin, Other Psychotropic Info: xanax, celexa    Dose: 0-15 Units Freq: 3 times daily with meals Route: [place] Dose: 0-5 Units Freq: Daily at bedtime Route: [place]         Current Medications (09/29/2018):  This is the current hospital active medication list Current Facility-Administered Medications  Medication Dose Route Frequency Provider Last Rate Last Dose  . 0.9 %  sodium chloride infusion (Manually program via Guardrails IV Fluids)   Intravenous Once [place] Pugh, CRNA      . 0.9 %  sodium chloride infusion   Intravenous Continuous Kyle, Tyrone A, DO 75 mL/hr at 09/29/18 0941    . acetaminophen (TYLENOL) tablet 325-650 mg  325-650 mg Oral Q6H PRN Danae Orleans, PA-C   325 mg at 09/27/18 0225  . ALPRAZolam Duanne Moron) tablet 0.25 mg  0.25 mg Oral BID Danae Orleans, PA-C   0.25 mg at 09/29/18 0957  . alum & mag hydroxide-simeth (MAALOX/MYLANTA) 200-200-20 MG/5ML suspension 15 mL  15 mL Oral Q4H PRN Danae Orleans, PA-C      . aspirin chewable tablet 81 mg  81 mg Oral BID Maurice March, PA-C   81 mg at 09/29/18 0957  . atenolol (TENORMIN) tablet 50 mg  50 mg Oral Daily Babish, Matthew, PA-C   50 mg at  09/29/18 9675  . bisacodyl (DULCOLAX) suppository 10 mg  10 mg Rectal Daily PRN Danae Orleans, PA-C      . cefUROXime (CEFTIN) tablet 250 mg  250 mg Oral BID Danae Orleans, PA-C   250 mg at 09/29/18 0959  . celecoxib (CELEBREX) capsule 200 mg  200 mg Oral BID Danae Orleans, PA-C   200 mg at 09/29/18 0959  . citalopram (CELEXA) tablet 40 mg  40 mg Oral Daily Danae Orleans, PA-C   40 mg at 09/29/18 0957  . dexamethasone (DECADRON) injection 10 mg  10 mg Intravenous Once Babish, Matthew, PA-C      . diltiazem (CARDIZEM CD) 24 hr capsule 180 mg  180 mg Oral Daily Danae Orleans, PA-C    180 mg at 09/29/18 9163  . diphenhydrAMINE (BENADRYL) 12.5 MG/5ML elixir 12.5-25 mg  12.5-25 mg Oral Q4H PRN Danae Orleans, PA-C      . docusate sodium (COLACE) capsule 100 mg  100 mg Oral BID Danae Orleans, PA-C   100 mg at 09/28/18 2205  . ferrous sulfate tablet 325 mg  325 mg Oral BID WC Danae Orleans, PA-C   325 mg at 09/29/18 0957  . fluticasone (FLONASE) 50 MCG/ACT nasal spray 1 spray  1 spray Each Nare Daily Danae Orleans, PA-C   1 spray at 09/29/18 0959  . Gerhardt's butt cream   Topical QID Marylyn Ishihara, Tyrone A, DO      . hydrALAZINE (APRESOLINE) injection 10 mg  10 mg Intravenous Q6H PRN Regalado, Belkys A, MD      . HYDROcodone-acetaminophen (NORCO/VICODIN) 5-325 MG per tablet 1 tablet  1 tablet Oral Q6H PRN Danae Orleans, PA-C   1 tablet at 09/29/18 1008  . insulin aspart (novoLOG) injection 0-15 Units  0-15 Units Subcutaneous TID WC Danae Orleans, PA-C   3 Units at 09/29/18 8466  . insulin aspart (novoLOG) injection 0-5 Units  0-5 Units Subcutaneous QHS Danae Orleans, PA-C   5 Units at 09/24/18 2311  . insulin glargine (LANTUS) injection 65 Units  65 Units Subcutaneous Daily Danae Orleans, PA-C   65 Units at 09/29/18 0956  . lactulose (CHRONULAC) 10 GM/15ML solution 20 g  20 g Oral BID Marylyn Ishihara, Tyrone A, DO   20 g at 09/28/18 2204  . loratadine (CLARITIN) tablet 10 mg  10 mg Oral Daily Danae Orleans, PA-C   10 mg at 09/29/18 0957  . magnesium citrate solution 1 Bottle  1 Bottle Oral Once PRN Babish, Matthew, PA-C      . menthol-cetylpyridinium (CEPACOL) lozenge 3 mg  1 lozenge Oral PRN Danae Orleans, PA-C       Or  . phenol (CHLORASEPTIC) mouth spray 1 spray  1 spray Mouth/Throat PRN Danae Orleans, PA-C      . methocarbamol (ROBAXIN) tablet 500 mg  500 mg Oral Q6H PRN Danae Orleans, PA-C   500 mg at 09/29/18 0426   Or  . methocarbamol (ROBAXIN) 500 mg in dextrose 5 % 50 mL IVPB  500 mg Intravenous Q6H PRN Babish, Matthew, PA-C      . metoCLOPramide (REGLAN) tablet 5-10  mg  5-10 mg Oral Q8H PRN Danae Orleans, PA-C       Or  . metoCLOPramide (REGLAN) injection 5-10 mg  5-10 mg Intravenous Q8H PRN Babish, Matthew, PA-C      . montelukast (SINGULAIR) tablet 10 mg  10 mg Oral QHS Babish, Matthew, PA-C   10 mg at 09/28/18 2206  . morphine 2 MG/ML injection 1 mg  1 mg Intravenous  Q4H PRN Danae Orleans, PA-C   1 mg at 09/29/18 0653  . naloxone [facility]) injection 0.4 mg  0.4 mg Intravenous PRN Danae Orleans, PA-C   0.4 mg at 09/25/18 1026  . ondansetron (ZOFRAN) tablet 4 mg  4 mg Oral Q6H PRN Danae Orleans, PA-C       Or  . ondansetron (ZOFRAN) injection 4 mg  4 mg Intravenous Q6H PRN Babish, Matthew, PA-C      . ondansetron (ZOFRAN) tablet 4 mg  4 mg Oral Q6H PRN Danae Orleans, PA-C       Or  . ondansetron [facility]) injection 4 mg  4 mg Intravenous Q6H PRN Babish, Matthew, PA-C      . polyethylene glycol (MIRALAX / GLYCOLAX) packet 17 g  17 g Oral BID Babish, Matthew, PA-C      . potassium chloride (K-DUR) CR tablet 10 mEq  10 mEq Oral Daily Danae Orleans, PA-C   10 mEq at 09/28/18 1050  . simvastatin (ZOCOR) tablet 40 mg  40 mg Oral QHS Danae Orleans, PA-C   40 mg at 09/28/18 2206     Discharge Medications: Please see discharge summary for a list of discharge medications.  Relevant Imaging Results:  Relevant Lab Results:   Additional Information [id number]  Lia Hopping, LCSW

## 2018-09-29 NOTE — TOC Initial Note (Signed)
Transition of Care [facility]) - Initial/Assessment Note    Patient Details  Name: Brittany Russo MRN: [id number] Date of Birth: 09-Feb-1946  Transition of [facility]) CM/SW Contact:    Lia Hopping, [place] Phone Number: 09/29/2018, 2:55 PM  Clinical Narrative:                 CSW discussed disposition planning with the patient. Patient is agreeable to SNF placement and prefers to go to a SNF [facility] in [place]. Patient gave CSW permission to contact SNF. CSW reached out to the facility and provided clinical information. Facility accepted.   SNF, request patient has a COVID-19 test within 48 hours of discharge.  Patient has level II PASRR, Physician will need to sign 30 day note.  CSW notified the nurse and 3W charge nurse to assist.   Expected Discharge Plan: Skilled Nursing Facility Barriers to Discharge: [place] [facility]) COVID-19 test results.    Patient Goals and CMS Choice   CMS Medicare.gov Compare Post Acute Care list provided to:: Patient Choice offered to / list presented to : Patient  Expected Discharge Plan and Services Expected Discharge Plan: [place] In-house Referral: Clinical Social Work   Post Acute Care Choice: [place] Living arrangements for the past 2 months: [place]                                      Prior Living Arrangements/Services Living arrangements for the past 2 months: [place] Lives with:: Spouse Patient language and need for interpreter reviewed:: No Do you feel safe going back to the place where you live?: Yes      Need for Family Participation in Patient Care: Yes (Comment) Care giver support system in place?: Yes (comment)   Criminal Activity/Legal Involvement Pertinent to Current Situation/Hospitalization: No - Comment as needed  Activities of Daily Living Home Assistive Devices/Equipment: Blood pressure cuff, CBG Meter, Walker (specify  type) ADL Screening (condition at time of admission) Patient's cognitive ability adequate to safely complete daily activities?: Yes Is the patient deaf or have difficulty hearing?: No Does the patient have difficulty seeing, even when wearing glasses/contacts?: No Does the patient have difficulty concentrating, remembering, or making decisions?: No Patient able to express need for assistance with ADLs?: Yes Does the patient have difficulty dressing or bathing?: No Independently performs ADLs?: Yes (appropriate for developmental age) Does the patient have difficulty walking or climbing stairs?: Yes Weakness of Legs: Right Weakness of Arms/Hands: None  Permission Sought/Granted Permission sought to share information with : Facility Art therapist granted to share information with : Yes, Verbal Permission Granted  Share Information with NAME: Florek,JOHN  Permission granted to share info w AGENCY: SNF's in [place] granted to share info w Relationship: Spouse  Permission granted to share info w Contact Information: [phone], [phone]  Emotional Assessment Appearance:: Appears stated age Attitude/Demeanor/Rapport: Engaged Affect (typically observed): Accepting Orientation: : Oriented to Self, Oriented to Place, Oriented to  Time, Oriented to Situation Alcohol / Substance Use: Not Applicable Psych Involvement: No (comment)  Admission diagnosis:  Knee Pain Patient Active Problem List   Diagnosis Date Noted  . Depression 09/28/2018  . Acute hepatic encephalopathy 09/28/2018  . Non-alcoholic cirrhosis ([place]) 96/28/3662  . Periprosthetic fracture around internal prosthetic right knee joint 09/24/2018  . S/P right TKA 09/22/2018  . Status post total knee replacement,  right 09/22/2018  . DM2 (diabetes mellitus, type 2) ([place]) 07/11/2015  . Hypertension 07/11/2015  . Hyperlipidemia 07/11/2015   PCP:  [place] [facility]:   [facility] [address] [place] [address] Phone: [phone] Fax: [phone]     Social Determinants of Health (SDOH) Interventions    Readmission Risk Interventions No flowsheet data found.

## 2018-09-30 DIAGNOSIS — D509 Iron deficiency anemia, unspecified: Secondary | ICD-10-CM | POA: Diagnosis present

## 2018-09-30 DIAGNOSIS — M2550 Pain in unspecified joint: Secondary | ICD-10-CM | POA: Diagnosis not present

## 2018-09-30 DIAGNOSIS — R110 Nausea: Secondary | ICD-10-CM | POA: Diagnosis not present

## 2018-09-30 DIAGNOSIS — F339 Major depressive disorder, recurrent, unspecified: Secondary | ICD-10-CM | POA: Diagnosis not present

## 2018-09-30 DIAGNOSIS — N390 Urinary tract infection, site not specified: Secondary | ICD-10-CM | POA: Diagnosis not present

## 2018-09-30 DIAGNOSIS — Z7401 Bed confinement status: Secondary | ICD-10-CM | POA: Diagnosis not present

## 2018-09-30 DIAGNOSIS — E8809 Other disorders of plasma-protein metabolism, not elsewhere classified: Secondary | ICD-10-CM | POA: Diagnosis present

## 2018-09-30 DIAGNOSIS — K7200 Acute and subacute hepatic failure without coma: Secondary | ICD-10-CM | POA: Diagnosis not present

## 2018-09-30 DIAGNOSIS — Z03818 Encounter for observation for suspected exposure to other biological agents ruled out: Secondary | ICD-10-CM | POA: Diagnosis not present

## 2018-09-30 DIAGNOSIS — K219 Gastro-esophageal reflux disease without esophagitis: Secondary | ICD-10-CM | POA: Diagnosis not present

## 2018-09-30 DIAGNOSIS — E875 Hyperkalemia: Secondary | ICD-10-CM | POA: Diagnosis not present

## 2018-09-30 DIAGNOSIS — Z96651 Presence of right artificial knee joint: Secondary | ICD-10-CM | POA: Diagnosis present

## 2018-09-30 DIAGNOSIS — D62 Acute posthemorrhagic anemia: Secondary | ICD-10-CM | POA: Diagnosis not present

## 2018-09-30 DIAGNOSIS — I10 Essential (primary) hypertension: Secondary | ICD-10-CM | POA: Diagnosis not present

## 2018-09-30 DIAGNOSIS — E785 Hyperlipidemia, unspecified: Secondary | ICD-10-CM | POA: Diagnosis not present

## 2018-09-30 DIAGNOSIS — Z1159 Encounter for screening for other viral diseases: Secondary | ICD-10-CM | POA: Diagnosis not present

## 2018-09-30 DIAGNOSIS — R58 Hemorrhage, not elsewhere classified: Secondary | ICD-10-CM | POA: Diagnosis not present

## 2018-09-30 DIAGNOSIS — M6281 Muscle weakness (generalized): Secondary | ICD-10-CM | POA: Diagnosis not present

## 2018-09-30 DIAGNOSIS — T84012D Broken internal right knee prosthesis, subsequent encounter: Secondary | ICD-10-CM | POA: Diagnosis not present

## 2018-09-30 DIAGNOSIS — J90 Pleural effusion, not elsewhere classified: Secondary | ICD-10-CM | POA: Diagnosis not present

## 2018-09-30 DIAGNOSIS — Z90710 Acquired absence of both cervix and uterus: Secondary | ICD-10-CM | POA: Diagnosis not present

## 2018-09-30 DIAGNOSIS — K7460 Unspecified cirrhosis of liver: Secondary | ICD-10-CM | POA: Diagnosis not present

## 2018-09-30 DIAGNOSIS — D649 Anemia, unspecified: Secondary | ICD-10-CM | POA: Diagnosis not present

## 2018-09-30 DIAGNOSIS — Z209 Contact with and (suspected) exposure to unspecified communicable disease: Secondary | ICD-10-CM | POA: Diagnosis not present

## 2018-09-30 DIAGNOSIS — E871 Hypo-osmolality and hyponatremia: Secondary | ICD-10-CM | POA: Diagnosis not present

## 2018-09-30 DIAGNOSIS — E119 Type 2 diabetes mellitus without complications: Secondary | ICD-10-CM | POA: Diagnosis not present

## 2018-09-30 DIAGNOSIS — R41841 Cognitive communication deficit: Secondary | ICD-10-CM | POA: Diagnosis not present

## 2018-09-30 DIAGNOSIS — Z9181 History of falling: Secondary | ICD-10-CM | POA: Diagnosis not present

## 2018-09-30 DIAGNOSIS — K921 Melena: Secondary | ICD-10-CM | POA: Diagnosis not present

## 2018-09-30 DIAGNOSIS — I361 Nonrheumatic tricuspid (valve) insufficiency: Secondary | ICD-10-CM | POA: Diagnosis not present

## 2018-09-30 DIAGNOSIS — R109 Unspecified abdominal pain: Secondary | ICD-10-CM | POA: Diagnosis not present

## 2018-09-30 DIAGNOSIS — E7800 Pure hypercholesterolemia, unspecified: Secondary | ICD-10-CM | POA: Diagnosis not present

## 2018-09-30 DIAGNOSIS — K7581 Nonalcoholic steatohepatitis (NASH): Secondary | ICD-10-CM | POA: Diagnosis present

## 2018-09-30 DIAGNOSIS — R140 Abdominal distension (gaseous): Secondary | ICD-10-CM | POA: Diagnosis not present

## 2018-09-30 DIAGNOSIS — Z961 Presence of intraocular lens: Secondary | ICD-10-CM | POA: Diagnosis present

## 2018-09-30 DIAGNOSIS — R601 Generalized edema: Secondary | ICD-10-CM | POA: Diagnosis not present

## 2018-09-30 DIAGNOSIS — S79929A Unspecified injury of unspecified thigh, initial encounter: Secondary | ICD-10-CM | POA: Diagnosis not present

## 2018-09-30 DIAGNOSIS — N179 Acute kidney failure, unspecified: Secondary | ICD-10-CM | POA: Diagnosis not present

## 2018-09-30 DIAGNOSIS — F419 Anxiety disorder, unspecified: Secondary | ICD-10-CM | POA: Diagnosis not present

## 2018-09-30 DIAGNOSIS — K922 Gastrointestinal hemorrhage, unspecified: Secondary | ICD-10-CM | POA: Diagnosis not present

## 2018-09-30 DIAGNOSIS — Y842 Radiological procedure and radiotherapy as the cause of abnormal reaction of the patient, or of later complication, without mention of misadventure at the time of the procedure: Secondary | ICD-10-CM | POA: Diagnosis present

## 2018-09-30 DIAGNOSIS — I959 Hypotension, unspecified: Secondary | ICD-10-CM | POA: Diagnosis not present

## 2018-09-30 DIAGNOSIS — R569 Unspecified convulsions: Secondary | ICD-10-CM | POA: Diagnosis present

## 2018-09-30 DIAGNOSIS — E1165 Type 2 diabetes mellitus with hyperglycemia: Secondary | ICD-10-CM | POA: Diagnosis not present

## 2018-09-30 DIAGNOSIS — B372 Candidiasis of skin and nail: Secondary | ICD-10-CM | POA: Diagnosis present

## 2018-09-30 DIAGNOSIS — S72491D Other fracture of lower end of right femur, subsequent encounter for closed fracture with routine healing: Secondary | ICD-10-CM | POA: Diagnosis not present

## 2018-09-30 DIAGNOSIS — Z7902 Long term (current) use of antithrombotics/antiplatelets: Secondary | ICD-10-CM | POA: Diagnosis not present

## 2018-09-30 DIAGNOSIS — Z794 Long term (current) use of insulin: Secondary | ICD-10-CM | POA: Diagnosis not present

## 2018-09-30 DIAGNOSIS — D61818 Other pancytopenia: Secondary | ICD-10-CM | POA: Diagnosis not present

## 2018-09-30 DIAGNOSIS — K625 Hemorrhage of anus and rectum: Secondary | ICD-10-CM | POA: Diagnosis not present

## 2018-09-30 DIAGNOSIS — Z79899 Other long term (current) drug therapy: Secondary | ICD-10-CM | POA: Diagnosis not present

## 2018-09-30 DIAGNOSIS — F329 Major depressive disorder, single episode, unspecified: Secondary | ICD-10-CM | POA: Diagnosis not present

## 2018-09-30 DIAGNOSIS — K7290 Hepatic failure, unspecified without coma: Secondary | ICD-10-CM | POA: Diagnosis present

## 2018-09-30 DIAGNOSIS — Z8542 Personal history of malignant neoplasm of other parts of uterus: Secondary | ICD-10-CM | POA: Diagnosis not present

## 2018-09-30 DIAGNOSIS — R188 Other ascites: Secondary | ICD-10-CM | POA: Diagnosis not present

## 2018-09-30 DIAGNOSIS — K627 Radiation proctitis: Secondary | ICD-10-CM | POA: Diagnosis present

## 2018-09-30 DIAGNOSIS — D638 Anemia in other chronic diseases classified elsewhere: Secondary | ICD-10-CM | POA: Diagnosis present

## 2018-09-30 LAB — GLUCOSE, CAPILLARY
Glucose-Capillary: 138 mg/dL — ABNORMAL HIGH (ref 70–99)
Glucose-Capillary: 221 mg/dL — ABNORMAL HIGH (ref 70–99)

## 2018-09-30 LAB — SARS CORONAVIRUS 2 BY RT PCR (HOSPITAL ORDER, PERFORMED IN ~~LOC~~ HOSPITAL LAB): SARS Coronavirus 2: NEGATIVE

## 2018-09-30 MED ORDER — ASPIRIN 81 MG PO CHEW: 81.0000 mg | CHEWABLE_TABLET | Freq: Two times a day (BID) | ORAL | 0 refills | Status: DC

## 2018-09-30 MED ORDER — METHOCARBAMOL 500 MG PO TABS: 500.0000 mg | ORAL_TABLET | Freq: Four times a day (QID) | ORAL | 0 refills | Status: DC | PRN

## 2018-09-30 MED ORDER — CANDESARTAN CILEXETIL 16 MG PO TABS: 16.0000 mg | ORAL_TABLET | Freq: Every day | ORAL | 0 refills | Status: DC

## 2018-09-30 MED ORDER — ASPIRIN 81 MG PO CHEW: 81.0000 mg | CHEWABLE_TABLET | Freq: Two times a day (BID) | ORAL | 0 refills | Status: AC

## 2018-09-30 MED ORDER — LACTULOSE 10 GM/15ML PO SOLN: 20.0000 g | Freq: Two times a day (BID) | ORAL | 0 refills | Status: AC

## 2018-09-30 MED ORDER — HYDROCODONE-ACETAMINOPHEN 5-325 MG PO TABS: 1.0000 | ORAL_TABLET | Freq: Four times a day (QID) | ORAL | 0 refills | Status: DC | PRN

## 2018-09-30 MED ORDER — ONDANSETRON HCL 4 MG PO TABS: 4.0000 mg | ORAL_TABLET | Freq: Four times a day (QID) | ORAL | 0 refills | Status: DC | PRN

## 2018-09-30 MED ORDER — CELECOXIB 200 MG PO CAPS: 200.0000 mg | ORAL_CAPSULE | Freq: Two times a day (BID) | ORAL | 0 refills | Status: DC

## 2018-09-30 MED ORDER — CEPHALEXIN 500 MG PO CAPS: 500.0000 mg | ORAL_CAPSULE | Freq: Three times a day (TID) | ORAL | 0 refills | Status: DC

## 2018-09-30 NOTE — Progress Notes (Addendum)
Subjective: 3 Days Post-Op Procedure(s) (LRB): TOTAL KNEE REVISION (Right) Patient reports pain as moderate.   Patient seen in rounds with Dr. Alvan Dame. Patient is doing fair this morning. She complains of pain in the right knee and left hip. No acute events overnight. Voiding without difficulty, positive BM. Awaiting discharge to SNF, pending COVID testing. Plan is to go Skilled nursing facility after hospital stay.  Objective: Vital signs in last 24 hours: Temp:  [97.8 F (36.6 C)-98.6 F (37 C)] 97.9 F (36.6 C) (07/10 0511) Pulse Rate:  [59-61] 61 (07/10 0511) Resp:  [16] 16 (07/10 0511) BP: (131-143)/(47-65) 131/47 (07/10 0511) SpO2:  [97 %-100 %] 98 % (07/10 0511)  Intake/Output from previous day:  Intake/Output Summary (Last 24 hours) at 09/30/2018 0728 Last data filed at 09/30/2018 0600 Gross per 24 hour  Intake 2487.93 ml  Output 1300 ml  Net 1187.93 ml    Intake/Output this shift: No intake/output data recorded.  Labs: Recent Labs    09/27/18 1916 09/28/18 0334 09/29/18 0320  HGB 9.0* 9.3* 11.1*   Recent Labs    09/28/18 0334 09/29/18 0320  WBC 10.5 10.7*  RBC 3.19*  3.19* 4.22  HCT 29.0* 34.7*  PLT 114* 282   Recent Labs    09/29/18 0320 09/29/18 1237  NA TEST CANCELLED PER MD  141 136  K TEST CANCELLED PER MD  5.4* 3.9  CL TEST CANCELLED PER MD  112* 104  CO2 TEST CANCELLED PER MD  19* 21*  BUN TEST CANCELLED PER MD  59* 27*  CREATININE TEST CANCELLED PER MD  2.84* 0.79  GLUCOSE TEST CANCELLED PER MD  130* 205*  CALCIUM TEST CANCELLED PER MD  8.4* 8.8*   No results for input(s): LABPT, INR in the last 72 hours.  Exam: General - Patient is Alert and Oriented Extremity - Neurologically intact Sensation intact distally Intact pulses distally Dorsiflexion/Plantar flexion intact Dressing/Incision - dressing C/D/I, hemovac removed Motor Function - intact, moving foot and toes well on exam.   Past Medical History:  Diagnosis Date   . Allergy   . Anxiety   . Arthritis   . Cirrhosis of liver (HCC)    nonalcoholic per patient , reports she takes a medication that makes her have bowel movements  to treat her liver   . Depression   . Diabetes mellitus without complication ([place])    type 2  . Eczema of both hands   . Endometrial cancer ([place]) 2005   treated with radiation therapy    . Headache   . Hyperlipidemia   . Hypertension   . Seizures ([place])    last seizure June 07, 2014   . Thyroid nodule   . Vaginal cancer (HCC)     Assessment/Plan: 3 Days Post-Op Procedure(s) (LRB): TOTAL KNEE REVISION (Right) Active Problems:   DM2 (diabetes mellitus, type 2) (HCC)   Hypertension   Hyperlipidemia   Periprosthetic fracture around internal prosthetic right knee joint   Depression   Acute hepatic encephalopathy   Non-alcoholic cirrhosis (HCC)  Estimated body mass index is 38.35 kg/m as calculated from the following:   Height as of this encounter: 5' 2"  (1.575 m).   Weight as of this encounter: 95.1 kg. Advance diet Up with therapy  DVT Prophylaxis - Aspirin Weight-bearing as tolerated Remain in knee immobilizer Hemovac drain removed today.  Portable X-ray of pelvis and right knee show no acute abnormalities, which we discussed with patient.  Plan for discharge to SNF [facility]  [place] in [place]) pending COVID testing.  We will order this today. She may continue working with PT as needed for transfers and gait training. Remain in knee immobilizer with no ROM of knee.   Hepatic encephalopathy per medicine DM per medicine  Appreciate continued input and treatment from medical team.    Griffith Citron, PA-C Orthopedic Surgery 09/30/2018, 7:28 AM

## 2018-09-30 NOTE — Discharge Summary (Addendum)
Physician Discharge Summary   Patient ID: RAELENE TREW MRN: [id number] DOB/AGE: 1946-02-14 73 y.o.  Admit date: 09/24/2018 Discharge date: 09/30/18  Primary Diagnosis: Right closed comminuted distal periprosthetic femur fracture with failure of implant  Admission Diagnoses:  Past Medical History:  Diagnosis Date   Allergy    Anxiety    Arthritis    Cirrhosis of liver ([place])    nonalcoholic per patient , reports she takes a medication that makes her have bowel movements  to treat her liver    Depression    Diabetes mellitus without complication ([employer])    type 2   Eczema of both hands    Endometrial cancer ([place]) 2005   treated with radiation therapy     Headache    Hyperlipidemia    Hypertension    Seizures ([place])    last seizure June 07, 2014    Thyroid nodule    Vaginal cancer [facility])    Discharge Diagnoses:   Active Problems:   DM2 (diabetes mellitus, type 2) ([place])   Hypertension   Hyperlipidemia   Periprosthetic fracture around internal prosthetic right knee joint   Depression   Acute hepatic encephalopathy   Non-alcoholic cirrhosis (HCC)  Estimated body mass index is 38.35 kg/m as calculated from the following:   Height as of this encounter: 5' 2"  (1.575 m).   Weight as of this encounter: 95.1 kg.  Procedure:  Procedure(s) (LRB): TOTAL KNEE REVISION (Right)   Consults: neurology and Internal medicine  HPI: The patient is a 73 year old female who had a index right total knee replacement performed on Thursday, 09/22/2018.  She had done well in the hospital and progressed with physical therapy and was discharged on 09/23/2018.   Unfortunately, when she had returned home, she had a traumatic fall on her knee.  She had inability to bear weight and was brought to the emergency room.  Radiographic assessment revealed that she had a severely comminuted distal femur fracture with dislocation of her femoral component.  She was admitted to the hospital.   While hospitalized, her medical condition seemed to deteriorate with the  complications associated with hepatic encephalopathy.  Surgery was scheduled.  Based on her radiographic assessment and CT scan, I felt that her only option was to revise her to a hinged knee component.  Risks of infection noted to be very high in this  patient based on her repeat operation, DVT, failure of her implant, failure of the extensor mechanism were all reviewed.  Consent was obtained for attempted salvage of her knee.   Laboratory Data: No results displayed because visit has over 200 results.    Admission on 09/22/2018, Discharged on 09/23/2018  Component Date Value Ref Range Status   Hgb A1c MFr Bld 09/22/2018 6.2* 4.8 - 5.6 % Final   Comment: (NOTE) Pre diabetes:          5.7%-6.4% Diabetes:              >6.4% Glycemic control for   <7.0% adults with diabetes    Mean Plasma Glucose 09/22/2018 131.24  mg/dL Final   Performed at [facility] [address], [place], [place] [address]   Glucose-Capillary 09/22/2018 160* 70 - 99 mg/dL Final   Specimen Description 09/22/2018    Final                   Value:URINE, CLEAN CATCH Performed at [facility], [place] [address] [place], [place] [address]  Special Requests 09/22/2018    Final                   Value:NONE Performed at [facility], [place] [address] [place], [place] [address]    Culture 09/22/2018 *  Final                   Value:<10,000 COLONIES/mL INSIGNIFICANT GROWTH Performed at [facility] [address], [place], [place] [address]    Report Status 09/22/2018 09/23/2018 FINAL   Final   Color, Urine 09/22/2018 YELLOW  YELLOW Final   APPearance 09/22/2018 CLEAR  CLEAR Final   Specific Gravity, Urine 09/22/2018 <1.005* 1.005 - 1.030 Final   pH 09/22/2018 6.5  5.0 - 8.0 Final   Glucose, UA 09/22/2018 NEGATIVE  NEGATIVE mg/dL Final   Hgb urine dipstick 09/22/2018 MODERATE*  NEGATIVE Final   Bilirubin Urine 09/22/2018 NEGATIVE  NEGATIVE Final   Ketones, ur 09/22/2018 NEGATIVE  NEGATIVE mg/dL Final   Protein, ur 09/22/2018 NEGATIVE  NEGATIVE mg/dL Final   Nitrite 09/22/2018 NEGATIVE  NEGATIVE Final   Leukocytes,Ua 09/22/2018 NEGATIVE  NEGATIVE Final   Performed at [facility], [place] [address], [place], [place] [address]   RBC / HPF 09/22/2018 0-5  0 - 5 RBC/hpf Final   WBC, UA 09/22/2018 0-5  0 - 5 WBC/hpf Final   Bacteria, UA 09/22/2018 RARE* NONE SEEN Final   Squamous Epithelial / LPF 09/22/2018 0-5  0 - 5 Final   Performed at [facility], [place] [address] [place], [place] [address]   Glucose-Capillary 09/22/2018 162* 70 - 99 mg/dL Final   Glucose-Capillary 09/22/2018 209* 70 - 99 mg/dL Final   Glucose-Capillary 09/22/2018 340* 70 - 99 mg/dL Final   WBC 09/23/2018 5.1  4.0 - 10.5 K/uL Final   RBC 09/23/2018 3.12* 3.87 - 5.11 MIL/uL Final   Hemoglobin 09/23/2018 9.0* 12.0 - 15.0 g/dL Final   HCT 09/23/2018 27.8* 36.0 - 46.0 % Final   MCV 09/23/2018 89.1  80.0 - 100.0 fL Final   MCH 09/23/2018 28.8  26.0 - 34.0 pg Final   MCHC 09/23/2018 32.4  30.0 - 36.0 g/dL Final   RDW 09/23/2018 12.8  11.5 - 15.5 % Final   Platelets 09/23/2018 58* 150 - 400 K/uL Final   Comment: Immature Platelet Fraction may be clinically indicated, consider ordering this additional test [id number] CONSISTENT WITH PREVIOUS RESULT    nRBC 09/23/2018 0.0  0.0 - 0.2 % Final   Performed at [facility], [place] [address] [place], [place] [address]   Sodium 09/23/2018 135  135 - 145 mmol/L Final   Potassium 09/23/2018 4.1  3.5 - 5.1 mmol/L Final   Chloride 09/23/2018 104  98 - 111 mmol/L Final   CO2 09/23/2018 25  22 - 32 mmol/L Final   Glucose, Bld 09/23/2018 273* 70 - 99 mg/dL Final   BUN 09/23/2018 15  8 - 23 mg/dL Final   Creatinine, Ser 09/23/2018 0.70  0.44 - 1.00 mg/dL Final   Calcium  09/23/2018 8.4* 8.9 - 10.3 mg/dL Final   GFR calc non Af Amer 09/23/2018 >60  >60 mL/min Final   GFR calc Af Amer 09/23/2018 >60  >60 mL/min Final   Anion gap 09/23/2018 6  5 - 15 Final   Performed at [facility], [place] [address], [place], [place] [address]   Glucose-Capillary 09/22/2018 303* 70 - 99 mg/dL Final   Glucose-Capillary 09/23/2018 240*  70 - 99 mg/dL Final   Glucose-Capillary 09/23/2018 257* 70 - 99 mg/dL Final  Hospital Outpatient Visit on 09/19/2018  Component Date Value Ref Range Status   Glucose-Capillary 09/19/2018 84  70 - 99 mg/dL Final   Sodium 09/19/2018 140  135 - 145 mmol/L Final   Potassium 09/19/2018 4.0  3.5 - 5.1 mmol/L Final   Chloride 09/19/2018 106  98 - 111 mmol/L Final   CO2 09/19/2018 26  22 - 32 mmol/L Final   Glucose, Bld 09/19/2018 83  70 - 99 mg/dL Final   BUN 09/19/2018 13  8 - 23 mg/dL Final   Creatinine, Ser 09/19/2018 0.72  0.44 - 1.00 mg/dL Final   Calcium 09/19/2018 9.2  8.9 - 10.3 mg/dL Final   GFR calc non Af Amer 09/19/2018 >60  >60 mL/min Final   GFR calc Af Amer 09/19/2018 >60  >60 mL/min Final   Anion gap 09/19/2018 8  5 - 15 Final   Performed at [facility], [place] [address] [place], [place] [address]   WBC 09/19/2018 4.7  4.0 - 10.5 K/uL Final   RBC 09/19/2018 3.88  3.87 - 5.11 MIL/uL Final   Hemoglobin 09/19/2018 11.6* 12.0 - 15.0 g/dL Final   HCT 09/19/2018 35.0* 36.0 - 46.0 % Final   MCV 09/19/2018 90.2  80.0 - 100.0 fL Final   MCH 09/19/2018 29.9  26.0 - 34.0 pg Final   MCHC 09/19/2018 33.1  30.0 - 36.0 g/dL Final   RDW 09/19/2018 12.9  11.5 - 15.5 % Final   Platelets 09/19/2018 76* 150 - 400 K/uL Final   Comment: Immature Platelet Fraction may be clinically indicated, consider ordering this additional test [id number]    nRBC 09/19/2018 0.0  0.0 - 0.2 % Final   Performed at [facility], [place] [address], [place], [place] [address]   ABO/RH(D)  09/19/2018 A POS   Final   Antibody Screen 09/19/2018 NEG   Final   Sample Expiration 09/19/2018 09/25/2018,2359   Final   Extend sample reason 09/19/2018    Final                   Value:NO TRANSFUSIONS OR PREGNANCY IN THE PAST 3 MONTHS Performed at [facility] [address], [place], [place] [address]    MRSA, PCR 09/19/2018 NEGATIVE  NEGATIVE Final   Staphylococcus aureus 09/19/2018 NEGATIVE  NEGATIVE Final   Comment: (NOTE) The Xpert SA Assay (FDA approved for NASAL specimens in patients 41 years of age and older), is one component of a comprehensive surveillance program. It is not intended to diagnose infection nor to guide or monitor treatment. Performed at [facility], [place] [address] [place], [place] [address]    ABO/RH(D) 09/19/2018    Final                   Value:A POS Performed at [facility], [place] [address] [place], [place] [address]   Hospital Outpatient Visit on 09/19/2018  Component Date Value Ref Range Status   SARS Coronavirus 2 09/19/2018 NEGATIVE  NEGATIVE Final   Comment: (NOTE) SARS-CoV-2 target nucleic acids are NOT DETECTED. The SARS-CoV-2 RNA is generally detectable in upper and lower respiratory specimens during the acute phase of infection. Negative results do not preclude SARS-CoV-2 infection, do not rule out co-infections with other pathogens, and should not be used as the sole basis for treatment or other patient management decisions. Negative results must  be combined with clinical observations, patient history, and epidemiological information. The expected result is Negative. Fact Sheet for Patients: SugarRoll.be Fact Sheet for Healthcare Providers: https://www.woods-mathews.com/ This test is not yet approved or cleared by the [place] FDA and  has been authorized for detection and/or diagnosis of SARS-CoV-2 by FDA under an Emergency  Use Authorization (EUA). This EUA will remain  in effect (meaning this test can be used) for the duration of the COVID-19 declaration under Section 56                          4(b)(1) of the Act, 21 U.S.C. section 360bbb-3(b)(1), unless the authorization is terminated or revoked sooner. Performed at [facility], [place] [address], [place], [place] [address]      X-Rays:Ct Knee Right Wo Contrast  Result Date: 09/24/2018 CLINICAL DATA:  Fracture dislocation. Recent total knee arthroplasty with knee injury EXAM: CT OF THE right KNEE WITHOUT CONTRAST TECHNIQUE: Multidetector CT imaging of the right knee was performed according to the standard protocol. Multiplanar CT image reconstructions were also generated. COMPARISON:  Radiograph 09/24/2018 FINDINGS: Examination is limited by significant artifact. As demonstrated on the radiographs there is a complex periprosthetic fracture involving the femoral component with significant varus deformity. There is also a posterior knee dislocation. Possible subtle fracture involving the lateral tibial plateau on the sagittal reformatted images but I can not confirm that on other views and may be artifactual. Appears to be a small avulsion fracture at the tibial tubercle. IMPRESSION: 1. Complex periprosthetic fracture involving the femoral component of the total knee arthroplasty. Significant varus deformity. 2. Posterior knee dislocation. 3. Avulsion fracture involving the tibial tubercle. No definite other tibial fractures. Electronically Signed   By: Marijo Sanes M.D.   On: 09/24/2018 11:15   US Abdomen Complete  Result Date: 09/26/2018 CLINICAL DATA:  Cirrhosis. EXAM: ABDOMEN ULTRASOUND COMPLETE COMPARISON:  Abdominal ultrasound 04/19/2015. MRCP 04/26/2015 and CT abdomen 04/07/2017. FINDINGS: Gallbladder: Mild gallbladder wall thickening without sonographic Murphy's sign. No evidence of gallstones. Common bile duct: Diameter: 6 mm Liver: There are morphologic  changes of cirrhosis with heterogeneity of the hepatic parenchyma. No focal lesions are identified. Portal vein is patent on color Doppler imaging with normal direction of blood flow towards the liver. IVC: No abnormality visualized. Pancreas: Visualized portion unremarkable. Spleen: Size and appearance within normal limits. Right Kidney: Length: 9.5 cm. Echogenicity within normal limits. No mass or hydronephrosis visualized. Left Kidney: Length: 10.5 cm. Echogenicity within normal limits. No mass or hydronephrosis visualized. Abdominal aorta: No aneurysm visualized. Portions of the aorta are obscured by bowel gas. Other findings: A small amount of ascites is present in the right and left upper quadrants of the abdomen. IMPRESSION: 1. Morphologic changes of cirrhosis without acute or focal abnormality identified. 2. Mild nonspecific gallbladder wall thickening, likely related to underlying liver disease. 3. Mild ascites. Electronically Signed   By: Richardean Sale M.D.   On: 09/26/2018 09:32   Dg Pelvis Portable  Result Date: 09/29/2018 CLINICAL DATA:  Left hip pain since right knee surgery 2 days ago. EXAM: PORTABLE PELVIS 1-2 VIEWS COMPARISON:  05/25/2018. FINDINGS: The left hip continues to have a normal appearance, as does the right hip. Lower lumbar spine degenerative changes are again noted. No fracture or dislocation seen. A single, tiny surgical clip or radiation seed is again noted in the inferior pelvis. IMPRESSION: No acute abnormality. Electronically Signed   By: Percell Locus.D.  On: 09/29/2018 09:33   Dg Knee Complete 4 Views Right  Result Date: 09/24/2018 CLINICAL DATA:  Right knee replacement, discharged yesterday. Twisting knee getting to the car at discharge with 2 falls tonight. Right knee pain. EXAM: RIGHT KNEE - COMPLETE 4+ VIEW COMPARISON:  None. FINDINGS: The femoral component of right knee arthroplasty is dislocated anteriorly with respect to the tibial tray. Acute periprosthetic  fracture of the distal femur, fracture plane not well assessed due to dislocation. Patella appears aligned with the femoral component, there is been patellar resurfacing. Diffuse soft tissue edema. IMPRESSION: Acute fracture dislocation of total right knee arthroplasty with anterior dislocation of the femoral component and associated periprosthetic femoral fracture. Electronically Signed   By: Keith Rake M.D.   On: 09/24/2018 02:25   Dg Knee Right Port  Result Date: 09/29/2018 CLINICAL DATA:  Diffuse right leg pain since right knee surgery 2 days ago. EXAM: PORTABLE RIGHT KNEE - 1-2 VIEW COMPARISON:  Right knee CT and radiographs dated 09/24/2018. FINDINGS: The previously demonstrated right knee prosthesis has been replaced with surgical absence of the previously demonstrated distal femur fracture. The prosthetic components are in anatomic position and alignment. There is associated intra-articular postoperative air and a surgical drain. No acute fracture or dislocation is seen. IMPRESSION: Postoperative changes, as described above. No acute abnormality. Electronically Signed   By: Claudie Revering M.D.   On: 09/29/2018 09:35   Ct Head Code Stroke Wo Contrast  Result Date: 09/25/2018 CLINICAL DATA:  Code stroke. Slurred speech. Tremors. Altered mental status. Week graft right. EXAM: CT HEAD WITHOUT CONTRAST TECHNIQUE: Contiguous axial images were obtained from the base of the skull through the vertex without intravenous contrast. COMPARISON:  06/08/2015 FINDINGS: Brain: Mild age related volume. No evidence of old or acute focal infarction, mass lesion, hydrocephalus or extra-axial collection. Vascular: No abnormal vascular finding Skull: Normal Sinuses/Orbits: Inflammatory changes of the paranasal sinuses affecting sinus Other: None ASPECTS ([place] Stroke Program Early CT Score) - Ganglionic level infarction (caudate, lentiform nuclei, internal capsule, insula, M1-M3 cortex): 7 - Supraganglionic infarction  (M4-M6 cortex): 3 Total score (0-10 with 10 being normal): 10 IMPRESSION: 1. Normal head CT for age. 2. Paranasal sinusitis worse in the right maxillary sinus. 3. ASPECTS is 10. 4. I spoke to the patient's nurse at 1050 hours, but we are unable to reach a covering physician but will keep trying. Electronically Signed   By: Nelson Chimes M.D.   On: 09/25/2018 10:55    EKG: Orders placed or performed during the hospital encounter of 06/01/17   EKG 12-Lead   EKG 12-Lead     Hospital Course: ALVEDA VANHORNE is a 73 y.o. who presented to the ED on 09/24/18, after a fall s/p right total knee arthroplasty on 09/22/18. She was evaluated and found to have a periprosthetic right femur fracture with subluxation of the total knee arthroplasty. She was admitted to [facility] for pain control, medical management, and anticipation of surgery with Dr. Alvan Dame. CT scan of the right knee obtained, which showed complex periprosthetic fracture involving the femoral componentof the total knee arthroplasty with significant varus deformity. On 09/25/18, rapid response was called due to altered mental status with right sided weakness of grip. Rapid response team had concern for CVA. Patient was taken for CT of the head, which showed no acute intracranial abnormalities. Narcan was administered with some improvement of symptoms. Neurology was consulted, and determined that her presentation was most consistent with hepatic encephalopathy. Neurology did not  recommend thrombolytic therapy or emergent vascular imaging. Medicine service was consulted for assistance with medical management. Ammonia levels were found to be elevated at 66. She was started on scheduled lactulose, with improvement. On 09/26/18, she was alert and oriented and ammonia continued to trend down. Surgery was cancelled due to lactulose induced bowel movements and other medical concerns.  They were brought to the operating room on 09/27/2018 and underwent  Procedure(s): TOTAL KNEE REVISION.  Patient tolerated the procedure well, but required 2 units of PRBCs. She was later transferred to the recovery room and then to the orthopaedic floor for postoperative care. They were given PO and IV analgesics for pain control following their surgery. They were given 24 hours of postoperative antibiotics of  Anti-infectives (From admission, onward)   Start     Dose/Rate Route Frequency Ordered Stop   09/30/18 0000  cephALEXin (KEFLEX) 500 MG capsule     500 mg Oral 3 times daily 09/30/18 0758 10/28/18 2359   09/27/18 1613  vancomycin (VANCOCIN) powder  Status:  Discontinued       As needed 09/27/18 1613 09/27/18 1824   09/27/18 1500  ceFAZolin (ANCEF) IVPB 2g/100 mL premix     2 g 200 mL/hr over 30 Minutes Intravenous  Once 09/27/18 1446 09/27/18 1542   09/27/18 1443  ceFAZolin (ANCEF) 2-4 GM/100ML-% IVPB    Note to Pharmacy: Christell Faith   : cabinet override      09/27/18 1443 09/27/18 1512   09/25/18 0930  ceFAZolin (ANCEF) IVPB 2g/100 mL premix     2 g 200 mL/hr over 30 Minutes Intravenous On call to O.R. 09/25/18 4128 09/26/18 0559   09/24/18 2200  cefUROXime (CEFTIN) tablet 250 mg     250 mg Oral 2 times daily 09/24/18 1542       and started on DVT prophylaxis in the form of Aspirin.  She was designated as WBAT, but placed in a knee immobilizer until outpatient follow up. PT and OT were ordered for gait training and transfers Discharge planning consulted to help with postop disposition and equipment needs. Patient had a fair night on the evening of surgery. Rapid response was called on 09/28/18 due to complaints of difficulty breathing. Xanax had been held earlier in the night due to low respiration rate. Patient appeared anxious on exam, but decision was made to hold Xanax due to medical comorbidities. She was seen in rounds, and noted to have improved hemoglobin at 9.3. On 09/29/18 she was seen in rounds and complained of left hip pain and right knee pain.  Xrays were obtained of the pelvis and right knee, which showed no acute abnormalities. Social work was consulted for assistance with discharge planning. A request for placement at [facility] in [place] was sent. Patient was seen in rounds on 09/30/18 and was doing well. Hemovac drain was pulled without difficulty. She was ready for transfer to SNF pending COVID testing. COVID testing was negative, and patient was discharged to SNF later that day.   Diet: Diabetic diet Activity: WBAT, remain in knee immobilizer until follow up Follow-up: in 2 weeks Disposition: Skilled nursing facility Discharged Condition: good   Discharge Instructions    Call MD / Call 911   Complete by: As directed    If you experience chest pain or shortness of breath, CALL 911 and be transported to the hospital emergency room.  If you develope a fever above 101 F, pus (white drainage) or increased drainage or redness at the wound,  or calf pain, call your surgeon's office.   Change dressing   Complete by: As directed    Maintain surgical dressing until follow up in the clinic. If the edges start to pull up, may reinforce with tape. If the dressing is no longer working, may remove and cover with gauze and tape, but must keep the area dry and clean.  Call with any questions or concerns.   Change dressing   Complete by: As directed    Maintain surgical dressing until follow up in the clinic. If the edges start to pull up, may reinforce with tape. If the dressing is no longer working, may remove and cover with gauze and tape, but must keep the area dry and clean.  Call with any questions or concerns.   Constipation Prevention   Complete by: As directed    Drink plenty of fluids.  Prune juice may be helpful.  You may use a stool softener, such as Colace (over the counter) 100 mg twice a day.  Use MiraLax (over the counter) for constipation as needed.   Diet - low sodium heart healthy   Complete by: As directed    Discharge  instructions   Complete by: As directed    Maintain surgical dressing until follow up in the clinic. If the edges start to pull up, may reinforce with tape. If the dressing is no longer working, may remove and cover with gauze and tape, but must keep the area dry and clean.  Follow up in 2 weeks at [facility]. Call with any questions or concerns.   Increase activity slowly as tolerated   Complete by: As directed    Weight bearing as tolerated with assist device (walker, cane, etc) as directed, use it as long as suggested by your surgeon or therapist, typically at least 4-6 weeks. Remain in knee immobilizer at all times, with no ROM of the right knee.   TED hose   Complete by: As directed    Use stockings (TED hose) for 2 weeks on both leg(s).  You may remove them at night for sleeping.   TED hose   Complete by: As directed    Use stockings (TED hose) for 2 weeks on both leg(s).  You may remove them at night for sleeping.     Allergies as of 09/30/2018      Reactions   Asa [aspirin]    Pt not allergic but cannot take due to bleeding    Other    Any jewelry metal-hives and itching       Medication List    STOP taking these medications   ALPRAZolam 0.25 MG tablet Commonly known as: XANAX   candesartan-hydrochlorothiazide 16-12.5 MG tablet Commonly known as: ATACAND HCT   cefPROZIL 500 MG tablet Commonly known as: CEFZIL   hydrochlorothiazide 12.5 MG capsule Commonly known as: MICROZIDE   HYDROcodone-acetaminophen 7.5-325 MG tablet Commonly known as: NORCO Replaced by: HYDROcodone-acetaminophen 5-325 MG tablet     TAKE these medications   aspirin 81 MG chewable tablet Chew 1 tablet (81 mg total) by mouth 2 (two) times a day for 28 days. What changed: when to take this   atenolol 50 MG tablet Commonly known as: TENORMIN Take 50 mg by mouth daily.   Basaglar KwikPen 100 UNIT/ML Sopn Inject 65 Units into the skin daily.   candesartan 16 MG tablet Commonly  known as: ATACAND Take 1 tablet (16 mg total) by mouth daily for 14 days.   Cartia XT  180 MG 24 hr capsule Generic drug: diltiazem Take 180 mg by mouth daily.   celecoxib 200 MG capsule Commonly known as: CELEBREX Take 1 capsule (200 mg total) by mouth 2 (two) times daily.   cephALEXin 500 MG capsule Commonly known as: KEFLEX Take 1 capsule (500 mg total) by mouth 3 (three) times daily for 28 days.   citalopram 40 MG tablet Commonly known as: CELEXA Take 40 mg by mouth daily.   ferrous sulfate 325 (65 FE) MG tablet Take 1 tablet (325 mg total) by mouth 2 (two) times daily with a meal for 14 days.   fluticasone 50 MCG/ACT nasal spray Commonly known as: FLONASE Place 1 spray into both nostrils daily.   glipiZIDE 5 MG tablet Commonly known as: GLUCOTROL Take 5 mg by mouth 2 (two) times daily.   HYDROcodone-acetaminophen 5-325 MG tablet Commonly known as: NORCO/VICODIN Take 1 tablet by mouth every 6 (six) hours as needed for moderate pain or severe pain. Replaces: HYDROcodone-acetaminophen 7.5-325 MG tablet   lactulose 10 GM/15ML solution Commonly known as: CHRONULAC Take 30 mLs (20 g total) by mouth 2 (two) times daily for 14 days. What changed:   how much to take  when to take this  reasons to take this   loratadine 10 MG tablet Commonly known as: CLARITIN Take 10 mg by mouth daily.   methocarbamol 500 MG tablet Commonly known as: ROBAXIN Take 1 tablet (500 mg total) by mouth every 6 (six) hours as needed for muscle spasms.   montelukast 10 MG tablet Commonly known as: SINGULAIR Take 10 mg by mouth at bedtime.   mupirocin nasal ointment 2 % Commonly known as: BACTROBAN Place 1 application into the nose 2 (two) times daily. Use one-half of tube in each nostril twice daily for five (5) days. After application, press sides of nose together and gently massage.   ondansetron 4 MG tablet Commonly known as: ZOFRAN Take 1 tablet (4 mg total) by mouth every 6  (six) hours as needed for nausea.   potassium chloride 10 MEQ tablet Commonly known as: K-DUR Take 10 mEq by mouth daily.   simvastatin 40 MG tablet Commonly known as: ZOCOR Take 40 mg by mouth at bedtime.   sitaGLIPtin-metformin 50-500 MG tablet Commonly known as: JANUMET Take 1 tablet by mouth 2 (two) times daily with a meal.            Discharge Care Instructions  (From admission, onward)         Start     Ordered   09/30/18 0000  Change dressing    Comments: Maintain surgical dressing until follow up in the clinic. If the edges start to pull up, may reinforce with tape. If the dressing is no longer working, may remove and cover with gauze and tape, but must keep the area dry and clean.  Call with any questions or concerns.   09/30/18 0758   09/30/18 0000  Change dressing    Comments: Maintain surgical dressing until follow up in the clinic. If the edges start to pull up, may reinforce with tape. If the dressing is no longer working, may remove and cover with gauze and tape, but must keep the area dry and clean.  Call with any questions or concerns.   09/30/18 0758           Signed: Griffith Citron, PA-C Orthopedic Surgery 09/30/2018, 3:43 PM

## 2018-09-30 NOTE — TOC Progression Note (Addendum)
Transition of Care [facility]) - Progression Note    Patient Details  Name: Brittany Russo MRN: [id number] Date of Birth: 1945-04-21  Transition of [facility]) CM/SW Contact  Leeroy Cha, RN Phone Number: 09/30/2018, 1:12 PM  Clinical Narrative:    1313/tct-Patricia at [facility] health care-informed that covid t4est is negative and faxed to her.  Need dc summary to send. DC SUMMARY ACQUIRED AND PT OKAYED TO BE TRANSFERRED.ptar called at 1516.  Expected Discharge Plan: Skilled Nursing Facility Barriers to Discharge: [place] [facility])  Expected Discharge Plan and Services Expected Discharge Plan: [place] In-house Referral: Clinical Social Work   Post Acute Care Choice: [place] Living arrangements for the past 2 months: [place] Expected Discharge Date: 09/30/18                                     Social Determinants of Health (SDOH) Interventions    Readmission Risk Interventions No flowsheet data found.

## 2018-09-30 NOTE — Care Management Important Message (Signed)
Important Message  Patient Details IM Letter given to Velva Harman RN to present to the Patient Name: Brittany Russo MRN: [id number] Date of Birth: 11/09/1945   Medicare Important Message Given:  Yes     Kerin Salen 09/30/2018, 1:03 PM

## 2018-09-30 NOTE — Discharge Instructions (Signed)
INSTRUCTIONS AFTER SURGERY  o Remove items at home which could result in a fall. This includes throw rugs or furniture in walking pathways o ICE to the affected joint every three hours while awake for 30 minutes at a time, for at least the first 3-5 days, and then as needed for pain and swelling.  Continue to use ice for pain and swelling. You may notice swelling that will progress down to the foot and ankle.  This is normal after surgery.  Elevate your leg when you are not up walking on it.   o Continue to use the breathing machine you got in the hospital (incentive spirometer) which will help keep your temperature down.  It is common for your temperature to cycle up and down following surgery, especially at night when you are not up moving around and exerting yourself.  The breathing machine keeps your lungs expanded and your temperature down.   DIET:  As you were doing prior to hospitalization, we recommend a well-balanced diet.  DRESSING / WOUND CARE / SHOWERING  Keep the surgical dressing until follow up.  The dressing is water proof, so you can shower without any extra covering.  IF THE DRESSING FALLS OFF or the wound gets wet inside, change the dressing with sterile gauze.  Please use good hand washing techniques before changing the dressing.  Do not use any lotions or creams on the incision until instructed by your surgeon.    ACTIVITY  o Increase activity slowly as tolerated, but follow the weight bearing instructions below.   o No driving for 6 weeks or until further direction given by your physician.  You cannot drive while taking narcotics.  o No lifting or carrying greater than 10 lbs. until further directed by your surgeon. o Avoid periods of inactivity such as sitting longer than an hour when not asleep. This helps prevent blood clots.  o You may return to work once you are authorized by your doctor.     WEIGHT BEARING   Weight bearing as tolerated with assist device (walker,  cane, etc) as directed, use it as long as suggested by your surgeon or therapist, typically at least 4-6 weeks. Remain in knee immobilizer at all times.    CONSTIPATION  Constipation is defined medically as fewer than three stools per week and severe constipation as less than one stool per week.  Even if you have a regular bowel pattern at home, your normal regimen is likely to be disrupted due to multiple reasons following surgery.  Combination of anesthesia, postoperative narcotics, change in appetite and fluid intake all can affect your bowels.   YOU MUST use at least one of the following options; they are listed in order of increasing strength to get the job done.  They are all available over the counter, and you may need to use some, POSSIBLY even all of these options:    Drink plenty of fluids (prune juice may be helpful) and high fiber foods Colace 100 mg by mouth twice a day  Senokot for constipation as directed and as needed Dulcolax (bisacodyl), take with full glass of water  Miralax (polyethylene glycol) once or twice a day as needed.  If you have tried all these things and are unable to have a bowel movement in the first 3-4 days after surgery call either your surgeon or your primary doctor.    If you experience loose stools or diarrhea, hold the medications until you stool forms back up.  If your symptoms do not get better within 1 week or if they get worse, check with your doctor.  If you experience "the worst abdominal pain ever" or develop nausea or vomiting, please contact the office immediately for further recommendations for treatment.   ITCHING:  If you experience itching with your medications, try taking only a single pain pill, or even half a pain pill at a time.  You can also use Benadryl over the counter for itching or also to help with sleep.   TED HOSE STOCKINGS:  Use stockings on both legs until for at least 2 weeks or as directed by physician office. They may be  removed at night for sleeping.  MEDICATIONS:  See your medication summary on the After Visit Summary that nursing will review with you.  You may have some home medications which will be placed on hold until you complete the course of blood thinner medication.  It is important for you to complete the blood thinner medication as prescribed.  PRECAUTIONS:  If you experience chest pain or shortness of breath - call 911 immediately for transfer to the hospital emergency department.   If you develop a fever greater that 101 F, purulent drainage from wound, increased redness or drainage from wound, foul odor from the wound/dressing, or calf pain - CONTACT YOUR SURGEON.                                                   FOLLOW-UP APPOINTMENTS:  If you do not already have a post-op appointment, please call the office for an appointment to be seen by your surgeon.  Guidelines for how soon to be seen are listed in your After Visit Summary, but are typically between 1-4 weeks after surgery.  MAKE SURE YOU:   Understand these instructions.   Get help right away if you are not doing well or get worse.    Thank you for letting us be a part of your medical care team.  It is a privilege we respect greatly.  We hope these instructions will help you stay on track for a fast and full recovery!

## 2018-09-30 NOTE — Progress Notes (Addendum)
[name]  PROGRESS NOTE    Brittany Russo  QPR:[id number] DOB: 06-02-1945 DOA: 09/24/2018 PCP: Rosalee Kaufman, PA-C   Brief Narrative:   73 year old with past medical history significant for depression, anxiety, cirrhosis of the liver nonalcoholic, depression, endometrial cancer, history of hypertension, diabetes, history of seizure, history of vaginal cancer who presents to the hospital complaining of right knee pain and is swelling seen her recent right total knee arthroplasty. She was discharged home following surgery when she was arriving at home she had 2 falls leaving the hospital and getting out of her car. She presented back to the emergency department and was found to have a periprosthetic femur fracture with subluxation of total right knee arthroplasty. Patient was supposed to go to the OR the morning of 6 July 5 but she was found to be very encephalopathic and somnolent, code stroke was called and telemetry neurology evaluated patient. [facility] was consulted to help with medical management.   Assessment & Plan:   Active Problems:   DM2 (diabetes mellitus, type 2) ([place])   Hypertension   Hyperlipidemia   Periprosthetic fracture around internal prosthetic right knee joint   Depression   Acute hepatic encephalopathy   Non-alcoholic cirrhosis ([place])    acute hepatic encephalopathy - Patient was somnolent, right hand weakness and tremors. - Ammonia was elevated. - Code stroke was activated on 7/5. CT head was negative for acute intracranial bleed. - Tele-neurology was consulted, Dr. Graylon Good did not recommend thrombolytic therapy or emergent vascular imaging. Patient presentation likely related to hepatic encephalopathy. - EEG awaiting results - lactulose was resumed; ammonia improved, pt is A&O x 3 - apparently she is non-compliant w/ lactulose at home d/t diarrhea; currently complaining or sore bottom and D, will decrease lactulose frequency     - she  again is A&O x 3 this morning; we can continue lactulose at current dosing for now (she reports diarrhea is a little better)  Cirrhosis: Nonalcoholic - Abdominal ultrasound: Mild ascites - Mild elevation of AST, bilirubin 1.3.     - stable  Anemia, normocytic - no bleeds noted - Hgb stable at 9.3 - continue iron supplement  Hyponatremia, hypochloremia - Agree with holding hydrochlorothiazide - Continue with IV fluids.     - resolved     - continue candesartan at discharge   Thrombocytopenia: - Related to cirrhosis. Stable.  Diabetes - Continue to hold linagliptin, metformin and glipizide - Sliding scale insulin - Continue with Lantus  Hypertension - Continue with diltiazem, atenolol. - can continue candesartan at discharge  Depression, anxiety - continue citalopram  Obesity  - BMI 38.35 - diet, exercise counseled  Doing well today. Labs appropriate. Recs for discharge: HTN: current dosing of diltiazem, atenolol, and can go home on candesartan 16mg  or irbesartan 150. Would discontinue HCTZ at discharge DM: can resume home regimen at discharge Cirrhosis: continue lactulose at 20mg  BID MDD/Anxiety: continue home citalopram Follow up with PCP in 5 - 7 days.  Remainder per primary.   Code Status: FULL Disposition Plan: Per primary   Subjective: "I feel better. This is hard."  Objective: Vitals:   09/29/18 1335 09/29/18 2144 09/30/18 0511 09/30/18 0837  BP: 131/65 (!) 143/54 (!) 131/47 (!) 147/53  Pulse: (!) 59 61 61 62  Resp: 16 16 16    Temp: 98.6 F (37 C) 97.8 F (36.6 C) 97.9 F (36.6 C)   TempSrc: Oral Oral Oral   SpO2: 100% 97% 98%   Weight:  Height:        Intake/Output Summary (Last 24 hours) at 09/30/2018 1505 Last data filed at 09/30/2018 1400 Gross per 24 hour  Intake 3130.37 ml  Output 1495 ml  Net 1635.37 ml   Filed Weights   09/24/18 0450 09/27/18 1339   Weight: 95.1 kg 95.1 kg    Examination:  General:73 y.o.femaleresting in bed in NAD Cardiovascular: RRR, +S1, S2, no m/g/r, equal pulses throughout Respiratory: CTABL, no w/r/r, normal WOB GI: BS+, NDNT, no masses noted, no organomegaly noted MSK: No e/c/c Skin: No rashes, bruises, ulcerations noted Neuro: A&O x 3, no focal deficits    Data Reviewed: I have personally reviewed following labs and imaging studies.  CBC: Recent Labs  Lab 09/24/18 0308 09/25/18 1119 09/26/18 0818 09/27/18 0317 09/27/18 1916 09/28/18 0334 09/29/18 0320  WBC 13.4* 7.9 7.6 4.8  --  10.5 10.7*  NEUTROABS 10.4*  --  5.5  --   --   --   --   HGB 8.6* 8.2* 8.0* 8.1* 9.0* 9.3* 11.1*  HCT 26.4* 24.2* 25.2* 24.3* 27.3* 29.0* 34.7*  MCV 88.6 87.7 89.4 88.4  --  90.9 82.2  PLT 120* 85* 94* 71*  --  114* 270   Basic Metabolic Panel: Recent Labs  Lab 09/26/18 0818 09/27/18 0317 09/28/18 0334 09/29/18 0320 09/29/18 1237  NA 127* 130* 132* TEST CANCELLED PER MD  141 136  K 3.8 3.5 4.7 TEST CANCELLED PER MD  5.4* 3.9  CL 94* 97* 102 TEST CANCELLED PER MD  112* 104  CO2 21* 26 22 TEST CANCELLED PER MD  19* 21*  GLUCOSE 225* 134* 270* TEST CANCELLED PER MD  130* 205*  BUN 24* 16 25* TEST CANCELLED PER MD  59* 27*  CREATININE 0.73 0.44 0.90 TEST CANCELLED PER MD  2.84* 0.79  CALCIUM 7.9* 7.8* 7.7* TEST CANCELLED PER MD  8.4* 8.8*  MG 2.0  --   --  2.4  --   PHOS 2.7  --   --  TEST CANCELLED PER MD 3.7   GFR: Estimated Creatinine Clearance: 70.3 mL/min (by C-G formula based on SCr of 0.79 mg/dL). Liver Function Tests: Recent Labs  Lab 09/25/18 1119 09/26/18 0818 09/29/18 0320 09/29/18 1237  AST 37 50*  --   --   ALT 22 26  --   --   ALKPHOS 140* 136*  --   --   BILITOT 1.2 1.3*  --   --   PROT 5.7* 5.4*  --   --   ALBUMIN 2.9* 2.6* TEST CANCELLED PER MD 3.7   No results for input(s): LIPASE, AMYLASE in the last 168 hours. Recent Labs  Lab 09/25/18 1119 09/26/18 0814   AMMONIA 66* 41*   Coagulation Profile: No results for input(s): INR, PROTIME in the last 168 hours. Cardiac Enzymes: No results for input(s): CKTOTAL, CKMB, CKMBINDEX, TROPONINI in the last 168 hours. BNP (last 3 results) No results for input(s): PROBNP in the last 8760 hours. HbA1C: No results for input(s): HGBA1C in the last 72 hours. CBG: Recent Labs  Lab 09/28/18 2131 09/29/18 1550 09/29/18 2146 09/30/18 0724 09/30/18 1122  GLUCAP 100* 207* 148* 138* 221*   Lipid Profile: No results for input(s): CHOL, HDL, LDLCALC, TRIG, CHOLHDL, LDLDIRECT in the last 72 hours. Thyroid Function Tests: No results for input(s): TSH, T4TOTAL, FREET4, T3FREE, THYROIDAB in the last 72 hours. Anemia Panel: Recent Labs    09/28/18 0334  VITAMINB12 386  FOLATE 13.3  FERRITIN 32  TIBC 356  IRON 62  RETICCTPCT 5.5*   Sepsis Labs: No results for input(s): PROCALCITON, LATICACIDVEN in the last 168 hours.  Recent Results (from the past 240 hour(s))  Urine Culture     Status: Abnormal   Collection Time: 09/22/18  7:18 AM   Specimen: Urine, Clean Catch  Result Value Ref Range Status   Specimen Description   Final    URINE, CLEAN CATCH Performed at [facility], [place] [address], [place], [place] [address]    Special Requests   Final    NONE Performed at [facility], [place] [address], [place], [place] [address]    Culture (A)  Final    <10,000 COLONIES/mL INSIGNIFICANT GROWTH Performed at [place] [address] [place], [place] [address]    Report Status 09/23/2018 FINAL  Final  SARS Coronavirus 2 (CEPHEID - Performed in [place] hospital lab), Hosp Order     Status: None   Collection Time: 09/24/18  8:13 AM   Specimen: Nasopharyngeal Swab  Result Value Ref Range Status   SARS Coronavirus 2 NEGATIVE NEGATIVE Final    Comment: (NOTE) If result is NEGATIVE SARS-CoV-2 target nucleic acids are NOT DETECTED. The SARS-CoV-2 RNA is  generally detectable in upper and lower  respiratory specimens during the acute phase of infection. The lowest  concentration of SARS-CoV-2 viral copies this assay can detect is 250  copies / mL. A negative result does not preclude SARS-CoV-2 infection  and should not be used as the sole basis for treatment or other  patient management decisions.  A negative result may occur with  improper specimen collection / handling, submission of specimen other  than nasopharyngeal swab, presence of viral mutation(s) within the  areas targeted by this assay, and inadequate number of viral copies  (<250 copies / mL). A negative result must be combined with clinical  observations, patient history, and epidemiological information. If result is POSITIVE SARS-CoV-2 target nucleic acids are DETECTED. The SARS-CoV-2 RNA is generally detectable in upper and lower  respiratory specimens dur ing the acute phase of infection.  Positive  results are indicative of active infection with SARS-CoV-2.  Clinical  correlation with patient history and other diagnostic information is  necessary to determine patient infection status.  Positive results do  not rule out bacterial infection or co-infection with other viruses. If result is PRESUMPTIVE POSTIVE SARS-CoV-2 nucleic acids MAY BE PRESENT.   A presumptive positive result was obtained on the submitted specimen  and confirmed on repeat testing.  While 2019 novel coronavirus  (SARS-CoV-2) nucleic acids may be present in the submitted sample  additional confirmatory testing may be necessary for epidemiological  and / or clinical management purposes  to differentiate between  SARS-CoV-2 and other Sarbecovirus currently known to infect humans.  If clinically indicated additional testing with an alternate test  methodology [phone]) is advised. The SARS-CoV-2 RNA is generally  detectable in upper and lower respiratory sp ecimens during the acute  phase of infection.  The expected result is Negative. Fact Sheet for Patients:  StrictlyIdeas.no Fact Sheet for Healthcare Providers: BankingDealers.co.za This test is not yet approved or cleared by the [place] FDA and has been authorized for detection and/or diagnosis of SARS-CoV-2 by FDA under an Emergency Use Authorization (EUA).  This EUA will remain in effect (meaning this test can be used) for the duration of the COVID-19 declaration under Section 564(b)(1) of the Act, 21 U.S.C. section 360bbb-3(b)(1), unless the  authorization is terminated or revoked sooner. Performed at [facility], [place] [address] [place], [place] [address]   Surgical pcr screen     Status: None   Collection Time: 09/26/18 12:10 AM   Specimen: Nasal Mucosa; Nasal Swab  Result Value Ref Range Status   MRSA, PCR NEGATIVE NEGATIVE Final   Staphylococcus aureus NEGATIVE NEGATIVE Final    Comment: (NOTE) The Xpert SA Assay (FDA approved for NASAL specimens in patients 58 years of age and older), is one component of a comprehensive surveillance program. It is not intended to diagnose infection nor to guide or monitor treatment. Performed at [facility], [place] [address], [place], [place] [address]   SARS Coronavirus 2 (CEPHEID - Performed in [place] hospital lab), Hosp Order     Status: None   Collection Time: 09/30/18  7:40 AM   Specimen: Nasopharyngeal Swab  Result Value Ref Range Status   SARS Coronavirus 2 NEGATIVE NEGATIVE Final    Comment: (NOTE) If result is NEGATIVE SARS-CoV-2 target nucleic acids are NOT DETECTED. The SARS-CoV-2 RNA is generally detectable in upper and lower  respiratory specimens during the acute phase of infection. The lowest  concentration of SARS-CoV-2 viral copies this assay can detect is 250  copies / mL. A negative result does not preclude SARS-CoV-2 infection  and should not be used as the sole basis  for treatment or other  patient management decisions.  A negative result may occur with  improper specimen collection / handling, submission of specimen other  than nasopharyngeal swab, presence of viral mutation(s) within the  areas targeted by this assay, and inadequate number of viral copies  (<250 copies / mL). A negative result must be combined with clinical  observations, patient history, and epidemiological information. If result is POSITIVE SARS-CoV-2 target nucleic acids are DETECTED. The SARS-CoV-2 RNA is generally detectable in upper and lower  respiratory specimens dur ing the acute phase of infection.  Positive  results are indicative of active infection with SARS-CoV-2.  Clinical  correlation with patient history and other diagnostic information is  necessary to determine patient infection status.  Positive results do  not rule out bacterial infection or co-infection with other viruses. If result is PRESUMPTIVE POSTIVE SARS-CoV-2 nucleic acids MAY BE PRESENT.   A presumptive positive result was obtained on the submitted specimen  and confirmed on repeat testing.  While 2019 novel coronavirus  (SARS-CoV-2) nucleic acids may be present in the submitted sample  additional confirmatory testing may be necessary for epidemiological  and / or clinical management purposes  to differentiate between  SARS-CoV-2 and other Sarbecovirus currently known to infect humans.  If clinically indicated additional testing with an alternate test  methodology [phone]) is advised. The SARS-CoV-2 RNA is generally  detectable in upper and lower respiratory sp ecimens during the acute  phase of infection. The expected result is Negative. Fact Sheet for Patients:  StrictlyIdeas.no Fact Sheet for Healthcare Providers: BankingDealers.co.za This test is not yet approved or cleared by the [place] FDA and has been authorized for detection and/or  diagnosis of SARS-CoV-2 by FDA under an Emergency Use Authorization (EUA).  This EUA will remain in effect (meaning this test can be used) for the duration of the COVID-19 declaration under Section 564(b)(1) of the Act, 21 U.S.C. section 360bbb-3(b)(1), unless the authorization is terminated or revoked sooner. Performed at [facility], [place] [address] [place], [place] [address]  Radiology Studies: Dg Pelvis Portable  Result Date: 09/29/2018 CLINICAL DATA:  Left hip pain since right knee surgery 2 days ago. EXAM: PORTABLE PELVIS 1-2 VIEWS COMPARISON:  05/25/2018. FINDINGS: The left hip continues to have a normal appearance, as does the right hip. Lower lumbar spine degenerative changes are again noted. No fracture or dislocation seen. A single, tiny surgical clip or radiation seed is again noted in the inferior pelvis. IMPRESSION: No acute abnormality. Electronically Signed   By: Claudie Revering M.D.   On: 09/29/2018 09:33   Dg Knee Right Port  Result Date: 09/29/2018 CLINICAL DATA:  Diffuse right leg pain since right knee surgery 2 days ago. EXAM: PORTABLE RIGHT KNEE - 1-2 VIEW COMPARISON:  Right knee CT and radiographs dated 09/24/2018. FINDINGS: The previously demonstrated right knee prosthesis has been replaced with surgical absence of the previously demonstrated distal femur fracture. The prosthetic components are in anatomic position and alignment. There is associated intra-articular postoperative air and a surgical drain. No acute fracture or dislocation is seen. IMPRESSION: Postoperative changes, as described above. No acute abnormality. Electronically Signed   By: Claudie Revering M.D.   On: 09/29/2018 09:35        Scheduled Meds: . sodium chloride   Intravenous Once  . ALPRAZolam  0.25 mg Oral BID  . aspirin  81 mg Oral BID  . atenolol  50 mg Oral Daily  . cefUROXime  250 mg Oral BID  . celecoxib  200 mg Oral BID  . citalopram  40 mg Oral Daily  .  dexamethasone (DECADRON) injection  10 mg Intravenous Once  . diltiazem  180 mg Oral Daily  . docusate sodium  100 mg Oral BID  . ferrous sulfate  325 mg Oral BID WC  . fluticasone  1 spray Each Nare Daily  . Gerhardt's butt cream   Topical QID  . insulin aspart  0-15 Units Subcutaneous TID WC  . insulin aspart  0-5 Units Subcutaneous QHS  . insulin glargine  65 Units Subcutaneous Daily  . lactulose  20 g Oral BID  . loratadine  10 mg Oral Daily  . montelukast  10 mg Oral QHS  . polyethylene glycol  17 g Oral BID  . potassium chloride  10 mEq Oral Daily  . simvastatin  40 mg Oral QHS   Continuous Infusions: . sodium chloride 75 mL/hr at 09/29/18 2303  . methocarbamol (ROBAXIN) IV       LOS: 6 days    Time spent: 25 minutes spent in the coordination of care today.    Jonnie Finner, DO [facility] Pager [phone]  If 7PM-7AM, please contact night-coverage [email or web address] Password [email or web address] 09/30/2018, 3:05 PM

## 2018-09-30 NOTE — Consult Note (Signed)
   [facility] CM Inpatient Consult   09/30/2018  Brittany Russo 12-Apr-1945 [id number]   Patient chart screened for potential [facility] Care Management services as member is eligible under ACO plan for Medicare insurance. Patient has unplanned readmission less than 30 days and readmission risk score of 21%, medium.  Per chart review, current disposition plan is for SNF. No [facility] CM needs.  Netta Cedars, MSN, [facility] Liaison Nurse Mobile Phone [phone]  Toll free office [phone]

## 2018-09-30 NOTE — Progress Notes (Signed)
The receiving nurse at [facility] rehab was provided with report. [facility] has been called for transport.

## 2018-09-30 NOTE — Progress Notes (Signed)
Physical Therapy Treatment Patient Details Name: Brittany Russo MRN: [id number] DOB: March 13, 1946 Today's Date: 09/30/2018    History of Present Illness 73 yo female adm with Right closed comminuted distal periprosthetic femur fracture with failure of implant. s/p R TKA revision on 09/27/18 per Dr. Alvan Dame. hx of DM, SZ, endometrial CA, and cirrhosis of the liver    PT Comments    Supine to sit with +2 max assist, +2 max assist sit to stand with Stedy lift equipment. Transferred pt to recliner with Stedy. Pt had significant pain in R knee and L hip throughout PT session, but put forth good effort. Placed lift pad under pt in recliner.   Follow Up Recommendations  SNF;Supervision/Assistance - 24 hour     Equipment Recommendations  None recommended by PT    Recommendations for Other Services       Precautions / Restrictions Precautions Precautions: Fall;Knee Precaution Comments: No ROM to R knee Required Braces or Orthoses: Knee Immobilizer - Right Knee Immobilizer - Right: On at all times Restrictions Weight Bearing Restrictions: No RLE Weight Bearing: Weight bearing as tolerated Other Position/Activity Restrictions: WBAT    Mobility  Bed Mobility Overal bed mobility: Needs Assistance Bed Mobility: Supine to Sit     Supine to sit: Max assist;+2 for physical assistance     General bed mobility comments: assist to raise trunk and advance BLEs (pt 30%)  Transfers Overall transfer level: Needs assistance   Transfers: Sit to/from Stand Sit to Stand: +2 physical assistance;Max assist;From elevated surface         General transfer comment: VCs hand placement, max A to rise and steady  Ambulation/Gait             General Gait Details: unable   Stairs             Wheelchair Mobility    Modified Rankin (Stroke Patients Only)       Balance Overall balance assessment: Needs assistance Sitting-balance support: No upper extremity supported;Feet  supported Sitting balance-Leahy Scale: Fair   Postural control: Posterior lean Standing balance support: Bilateral upper extremity supported Standing balance-Leahy Scale: Poor Standing balance comment: heavily reliant on UEs throughout session for stability                            Cognition Arousal/Alertness: Awake/alert Behavior During Therapy: WFL for tasks assessed/performed;Anxious Overall Cognitive Status: Within Functional Limits for tasks assessed                                 General Comments: anxious about falling and due to high pain level      Exercises Total Joint Exercises Ankle Circles/Pumps: AROM;Both;10 reps;Supine    General Comments        Pertinent Vitals/Pain Pain Score: 8  Pain Location: L hip and R knee Pain Descriptors / Indicators: Sore Pain Intervention(s): Limited activity within patient's tolerance;Monitored during session;Premedicated before session;Ice applied    Home Living                      Prior Function            PT Goals (current goals can now be found in the care plan section) Acute Rehab PT Goals Patient Stated Goal: be able to walk PT Goal Formulation: With patient Time For Goal Achievement: 09/29/18 Potential to Achieve Goals: Good Progress  towards PT goals: Progressing toward goals    Frequency    7X/week      PT Plan Current plan remains appropriate    Co-evaluation              AM-PAC PT "6 Clicks" Mobility   Outcome Measure  Help needed turning from your back to your side while in a flat bed without using bedrails?: A Lot Help needed moving from lying on your back to sitting on the side of a flat bed without using bedrails?: A Lot Help needed moving to and from a bed to a chair (including a wheelchair)?: Total Help needed standing up from a chair using your arms (e.g., wheelchair or bedside chair)?: Total Help needed to walk in hospital room?: Total Help needed  climbing 3-5 steps with a railing? : Total 6 Click Score: 8    End of Session Equipment Utilized During Treatment: Gait belt;Right knee immobilizer Activity Tolerance: Patient limited by pain Patient left: with call bell/phone within reach;in chair Nurse Communication: Mobility status;Need for lift equipment(pt on bedpan) PT Visit Diagnosis: Unsteadiness on feet (R26.81);Difficulty in walking, not elsewhere classified (R26.2);History of falling (Z91.81);Muscle weakness (generalized) (M62.81);Pain Pain - Right/Left: Right Pain - part of body: Knee     Time: 3212-2482 PT Time Calculation (min) (ACUTE ONLY): 25 min  Charges:  $Therapeutic Activity: 23-37 min                     Blondell Reveal Kistler PT 09/30/2018  Acute Rehabilitation Services Pager [phone] Office [phone]

## 2018-10-03 ENCOUNTER — Telehealth: Payer: Self-pay | Admitting: *Deleted

## 2018-10-03 NOTE — Telephone Encounter (Signed)
Wife has post hospital scheduled for 10/14/2018 with Daune Perch.  Stated she initially had knee replacement & then after coming home had a fall & broke leg that she just had knee replacement on.  So she has now had a second surgery. She is currently at the nursing center for rehab.  Husband would like to know your thoughts on when to see her back for follow up.  VV options were also discussed, but not sure that is what she needs yet as she just got to rehab today.

## 2018-10-04 NOTE — Telephone Encounter (Signed)
From looking at some of the records I don't see where she had any cardiac issues during those admissoins, please verify with husband. From my understanding its just a routine f/u with PA Phylliss Bob. Would be ok to push back 3 months to allow her to recover from her recent surgery, if cardiac issues they can always contact us sooner   J BrancH MD

## 2018-10-05 NOTE — Telephone Encounter (Signed)
Husband [name]) notified & verbalized understanding.  3 mo OV scheduled for 01/20/2019 with Dr. Harl Bowie.

## 2018-10-09 DIAGNOSIS — E1165 Type 2 diabetes mellitus with hyperglycemia: Secondary | ICD-10-CM | POA: Diagnosis not present

## 2018-10-09 DIAGNOSIS — T84012D Broken internal right knee prosthesis, subsequent encounter: Secondary | ICD-10-CM | POA: Diagnosis not present

## 2018-10-09 DIAGNOSIS — D62 Acute posthemorrhagic anemia: Secondary | ICD-10-CM | POA: Diagnosis not present

## 2018-10-10 ENCOUNTER — Inpatient Hospital Stay (HOSPITAL_COMMUNITY): Admission: AD | Admit: 2018-10-10 | Payer: Medicare Other | Admitting: Internal Medicine

## 2018-10-10 ENCOUNTER — Encounter (HOSPITAL_COMMUNITY): Payer: Self-pay | Admitting: Emergency Medicine

## 2018-10-10 ENCOUNTER — Encounter: Payer: Self-pay | Admitting: Physician Assistant

## 2018-10-10 ENCOUNTER — Other Ambulatory Visit: Payer: Self-pay

## 2018-10-10 ENCOUNTER — Inpatient Hospital Stay (HOSPITAL_COMMUNITY)
Admission: EM | Admit: 2018-10-10 | Discharge: 2018-10-14 | DRG: 378 | Disposition: A | Discharge status: Skilled Nursing Facility | Payer: Medicare Other | Attending: Internal Medicine | Admitting: Internal Medicine

## 2018-10-10 DIAGNOSIS — N179 Acute kidney failure, unspecified: Secondary | ICD-10-CM | POA: Diagnosis not present

## 2018-10-10 DIAGNOSIS — M2550 Pain in unspecified joint: Secondary | ICD-10-CM | POA: Diagnosis not present

## 2018-10-10 DIAGNOSIS — D509 Iron deficiency anemia, unspecified: Secondary | ICD-10-CM | POA: Diagnosis present

## 2018-10-10 DIAGNOSIS — R8569 Abnormal cytological findings in specimens from other digestive organs and abdominal cavity: Secondary | ICD-10-CM | POA: Diagnosis not present

## 2018-10-10 DIAGNOSIS — D649 Anemia, unspecified: Secondary | ICD-10-CM | POA: Diagnosis not present

## 2018-10-10 DIAGNOSIS — Y842 Radiological procedure and radiotherapy as the cause of abnormal reaction of the patient, or of later complication, without mention of misadventure at the time of the procedure: Secondary | ICD-10-CM | POA: Diagnosis present

## 2018-10-10 DIAGNOSIS — Z8544 Personal history of malignant neoplasm of other female genital organs: Secondary | ICD-10-CM

## 2018-10-10 DIAGNOSIS — Z8542 Personal history of malignant neoplasm of other parts of uterus: Secondary | ICD-10-CM

## 2018-10-10 DIAGNOSIS — K7460 Unspecified cirrhosis of liver: Secondary | ICD-10-CM

## 2018-10-10 DIAGNOSIS — Z7401 Bed confinement status: Secondary | ICD-10-CM | POA: Diagnosis not present

## 2018-10-10 DIAGNOSIS — K625 Hemorrhage of anus and rectum: Secondary | ICD-10-CM

## 2018-10-10 DIAGNOSIS — F329 Major depressive disorder, single episode, unspecified: Secondary | ICD-10-CM | POA: Diagnosis present

## 2018-10-10 DIAGNOSIS — E871 Hypo-osmolality and hyponatremia: Secondary | ICD-10-CM | POA: Diagnosis present

## 2018-10-10 DIAGNOSIS — E119 Type 2 diabetes mellitus without complications: Secondary | ICD-10-CM | POA: Diagnosis present

## 2018-10-10 DIAGNOSIS — Z79899 Other long term (current) drug therapy: Secondary | ICD-10-CM | POA: Diagnosis not present

## 2018-10-10 DIAGNOSIS — E785 Hyperlipidemia, unspecified: Secondary | ICD-10-CM | POA: Diagnosis not present

## 2018-10-10 DIAGNOSIS — Z1159 Encounter for screening for other viral diseases: Secondary | ICD-10-CM | POA: Diagnosis not present

## 2018-10-10 DIAGNOSIS — Z961 Presence of intraocular lens: Secondary | ICD-10-CM | POA: Diagnosis present

## 2018-10-10 DIAGNOSIS — I959 Hypotension, unspecified: Secondary | ICD-10-CM | POA: Diagnosis not present

## 2018-10-10 DIAGNOSIS — K219 Gastro-esophageal reflux disease without esophagitis: Secondary | ICD-10-CM | POA: Diagnosis not present

## 2018-10-10 DIAGNOSIS — E7800 Pure hypercholesterolemia, unspecified: Secondary | ICD-10-CM | POA: Diagnosis not present

## 2018-10-10 DIAGNOSIS — J90 Pleural effusion, not elsewhere classified: Secondary | ICD-10-CM | POA: Diagnosis present

## 2018-10-10 DIAGNOSIS — R188 Other ascites: Secondary | ICD-10-CM | POA: Diagnosis not present

## 2018-10-10 DIAGNOSIS — Z833 Family history of diabetes mellitus: Secondary | ICD-10-CM

## 2018-10-10 DIAGNOSIS — I10 Essential (primary) hypertension: Secondary | ICD-10-CM | POA: Diagnosis present

## 2018-10-10 DIAGNOSIS — B372 Candidiasis of skin and nail: Secondary | ICD-10-CM | POA: Diagnosis present

## 2018-10-10 DIAGNOSIS — D638 Anemia in other chronic diseases classified elsewhere: Secondary | ICD-10-CM | POA: Diagnosis present

## 2018-10-10 DIAGNOSIS — Z90710 Acquired absence of both cervix and uterus: Secondary | ICD-10-CM

## 2018-10-10 DIAGNOSIS — Z923 Personal history of irradiation: Secondary | ICD-10-CM

## 2018-10-10 DIAGNOSIS — E8809 Other disorders of plasma-protein metabolism, not elsewhere classified: Secondary | ICD-10-CM | POA: Diagnosis present

## 2018-10-10 DIAGNOSIS — K627 Radiation proctitis: Secondary | ICD-10-CM | POA: Diagnosis present

## 2018-10-10 DIAGNOSIS — R601 Generalized edema: Secondary | ICD-10-CM | POA: Diagnosis present

## 2018-10-10 DIAGNOSIS — D61818 Other pancytopenia: Secondary | ICD-10-CM | POA: Diagnosis not present

## 2018-10-10 DIAGNOSIS — Z96651 Presence of right artificial knee joint: Secondary | ICD-10-CM | POA: Diagnosis present

## 2018-10-10 DIAGNOSIS — R140 Abdominal distension (gaseous): Secondary | ICD-10-CM | POA: Diagnosis not present

## 2018-10-10 DIAGNOSIS — N189 Chronic kidney disease, unspecified: Secondary | ICD-10-CM | POA: Diagnosis present

## 2018-10-10 DIAGNOSIS — Z9842 Cataract extraction status, left eye: Secondary | ICD-10-CM

## 2018-10-10 DIAGNOSIS — F419 Anxiety disorder, unspecified: Secondary | ICD-10-CM | POA: Diagnosis not present

## 2018-10-10 DIAGNOSIS — Z7982 Long term (current) use of aspirin: Secondary | ICD-10-CM

## 2018-10-10 DIAGNOSIS — Z7902 Long term (current) use of antithrombotics/antiplatelets: Secondary | ICD-10-CM | POA: Diagnosis not present

## 2018-10-10 DIAGNOSIS — F32A Depression, unspecified: Secondary | ICD-10-CM | POA: Diagnosis present

## 2018-10-10 DIAGNOSIS — R41841 Cognitive communication deficit: Secondary | ICD-10-CM | POA: Diagnosis not present

## 2018-10-10 DIAGNOSIS — Z886 Allergy status to analgesic agent status: Secondary | ICD-10-CM

## 2018-10-10 DIAGNOSIS — I361 Nonrheumatic tricuspid (valve) insufficiency: Secondary | ICD-10-CM | POA: Diagnosis not present

## 2018-10-10 DIAGNOSIS — Z91048 Other nonmedicinal substance allergy status: Secondary | ICD-10-CM

## 2018-10-10 DIAGNOSIS — M6281 Muscle weakness (generalized): Secondary | ICD-10-CM | POA: Diagnosis not present

## 2018-10-10 DIAGNOSIS — Z209 Contact with and (suspected) exposure to unspecified communicable disease: Secondary | ICD-10-CM | POA: Diagnosis not present

## 2018-10-10 DIAGNOSIS — Z9181 History of falling: Secondary | ICD-10-CM | POA: Diagnosis not present

## 2018-10-10 DIAGNOSIS — E875 Hyperkalemia: Secondary | ICD-10-CM | POA: Diagnosis not present

## 2018-10-10 DIAGNOSIS — Z9841 Cataract extraction status, right eye: Secondary | ICD-10-CM

## 2018-10-10 DIAGNOSIS — K922 Gastrointestinal hemorrhage, unspecified: Secondary | ICD-10-CM | POA: Diagnosis present

## 2018-10-10 DIAGNOSIS — R2689 Other abnormalities of gait and mobility: Secondary | ICD-10-CM | POA: Diagnosis not present

## 2018-10-10 DIAGNOSIS — K7581 Nonalcoholic steatohepatitis (NASH): Secondary | ICD-10-CM | POA: Diagnosis present

## 2018-10-10 DIAGNOSIS — Z794 Long term (current) use of insulin: Secondary | ICD-10-CM | POA: Diagnosis not present

## 2018-10-10 DIAGNOSIS — N390 Urinary tract infection, site not specified: Secondary | ICD-10-CM | POA: Diagnosis not present

## 2018-10-10 DIAGNOSIS — W19XXXA Unspecified fall, initial encounter: Secondary | ICD-10-CM | POA: Diagnosis not present

## 2018-10-10 DIAGNOSIS — F339 Major depressive disorder, recurrent, unspecified: Secondary | ICD-10-CM | POA: Diagnosis not present

## 2018-10-10 DIAGNOSIS — R5381 Other malaise: Secondary | ICD-10-CM | POA: Diagnosis not present

## 2018-10-10 DIAGNOSIS — R569 Unspecified convulsions: Secondary | ICD-10-CM | POA: Diagnosis present

## 2018-10-10 DIAGNOSIS — R58 Hemorrhage, not elsewhere classified: Secondary | ICD-10-CM | POA: Diagnosis not present

## 2018-10-10 DIAGNOSIS — K921 Melena: Principal | ICD-10-CM | POA: Diagnosis present

## 2018-10-10 DIAGNOSIS — K7290 Hepatic failure, unspecified without coma: Secondary | ICD-10-CM | POA: Diagnosis present

## 2018-10-10 DIAGNOSIS — R110 Nausea: Secondary | ICD-10-CM | POA: Diagnosis not present

## 2018-10-10 DIAGNOSIS — R109 Unspecified abdominal pain: Secondary | ICD-10-CM | POA: Diagnosis not present

## 2018-10-10 DIAGNOSIS — R52 Pain, unspecified: Secondary | ICD-10-CM | POA: Diagnosis not present

## 2018-10-10 DIAGNOSIS — S72491D Other fracture of lower end of right femur, subsequent encounter for closed fracture with routine healing: Secondary | ICD-10-CM | POA: Diagnosis not present

## 2018-10-10 DIAGNOSIS — K7031 Alcoholic cirrhosis of liver with ascites: Secondary | ICD-10-CM

## 2018-10-10 DIAGNOSIS — Z03818 Encounter for observation for suspected exposure to other biological agents ruled out: Secondary | ICD-10-CM | POA: Diagnosis not present

## 2018-10-10 DIAGNOSIS — Z8249 Family history of ischemic heart disease and other diseases of the circulatory system: Secondary | ICD-10-CM

## 2018-10-10 LAB — URINALYSIS, ROUTINE W REFLEX MICROSCOPIC
Bilirubin Urine: NEGATIVE
Glucose, UA: NEGATIVE mg/dL
Ketones, ur: NEGATIVE mg/dL
Nitrite: NEGATIVE
Protein, ur: NEGATIVE mg/dL
Specific Gravity, Urine: 1.01 (ref 1.005–1.030)
WBC, UA: >50 WBC/hpf — ABNORMAL HIGH (ref 0–5)
pH: 5 (ref 5.0–8.0)

## 2018-10-10 LAB — CBC WITH DIFFERENTIAL/PLATELET
Band Neutrophils: 0 %
Basophils Absolute: 0 10*3/uL (ref 0.0–0.1)
Basophils Relative: 0 %
Blasts: 0 %
Eosinophils Absolute: 0.2 10*3/uL (ref 0.0–0.5)
Eosinophils Relative: 5 %
HCT: 25.2 % — ABNORMAL LOW (ref 36.0–46.0)
Hemoglobin: 7.7 g/dL — ABNORMAL LOW (ref 12.0–15.0)
Lymphocytes Relative: 20 %
Lymphs Abs: 0.7 10*3/uL (ref 0.7–4.0)
MCH: 30.4 pg (ref 26.0–34.0)
MCHC: 30.6 g/dL (ref 30.0–36.0)
MCV: 99.6 fL (ref 80.0–100.0)
Metamyelocytes Relative: 0 %
Monocytes Absolute: 0.5 10*3/uL (ref 0.1–1.0)
Monocytes Relative: 15 %
Myelocytes: 0 %
Neutro Abs: 2.2 10*3/uL (ref 1.7–7.7)
Neutrophils Relative %: 60 %
Platelets: 87 10*3/uL — ABNORMAL LOW (ref 150–400)
Promyelocytes Relative: 0 %
RBC: 2.53 MIL/uL — ABNORMAL LOW (ref 3.87–5.11)
RDW: 18.6 % — ABNORMAL HIGH (ref 11.5–15.5)
WBC: 3.6 10*3/uL — ABNORMAL LOW (ref 4.0–10.5)
nRBC: 0 % (ref 0.0–0.2)
nRBC: 0 /100 WBC

## 2018-10-10 LAB — CBG MONITORING, ED
Glucose-Capillary: 86 mg/dL (ref 70–99)
Glucose-Capillary: 130 mg/dL — ABNORMAL HIGH (ref 70–99)

## 2018-10-10 LAB — RAPID URINE DRUG SCREEN, HOSP PERFORMED
Amphetamines: NOT DETECTED
Barbiturates: NOT DETECTED
Benzodiazepines: POSITIVE — AB
Cocaine: NOT DETECTED
Opiates: POSITIVE — AB
Tetrahydrocannabinol: NOT DETECTED

## 2018-10-10 LAB — PROTIME-INR
INR: 1.5 — ABNORMAL HIGH (ref 0.8–1.2)
Prothrombin Time: 17.9 seconds — ABNORMAL HIGH (ref 11.4–15.2)

## 2018-10-10 LAB — COMPREHENSIVE METABOLIC PANEL
ALT: 42 U/L (ref 0–44)
AST: 60 U/L — ABNORMAL HIGH (ref 15–41)
Albumin: 2 g/dL — ABNORMAL LOW (ref 3.5–5.0)
Alkaline Phosphatase: 141 U/L — ABNORMAL HIGH (ref 38–126)
Anion gap: 5 (ref 5–15)
BUN: 24 mg/dL — ABNORMAL HIGH (ref 8–23)
CO2: 22 mmol/L (ref 22–32)
Calcium: 7.9 mg/dL — ABNORMAL LOW (ref 8.9–10.3)
Chloride: 105 mmol/L (ref 98–111)
Creatinine, Ser: 1.51 mg/dL — ABNORMAL HIGH (ref 0.44–1.00)
GFR calc Af Amer: 40 mL/min — ABNORMAL LOW (ref >60)
GFR calc non Af Amer: 35 mL/min — ABNORMAL LOW (ref >60)
Glucose, Bld: 104 mg/dL — ABNORMAL HIGH (ref 70–99)
Potassium: 4.5 mmol/L (ref 3.5–5.1)
Sodium: 132 mmol/L — ABNORMAL LOW (ref 135–145)
Total Bilirubin: 1.4 mg/dL — ABNORMAL HIGH (ref 0.3–1.2)
Total Protein: 5 g/dL — ABNORMAL LOW (ref 6.5–8.1)

## 2018-10-10 LAB — TYPE AND SCREEN
ABO/RH(D): A POS
Antibody Screen: NEGATIVE

## 2018-10-10 LAB — SARS CORONAVIRUS 2 BY RT PCR (HOSPITAL ORDER, PERFORMED IN ~~LOC~~ HOSPITAL LAB): SARS Coronavirus 2: NEGATIVE

## 2018-10-10 MED ORDER — SODIUM CHLORIDE 0.9 % IV SOLN
1.0000 g | Freq: Once | INTRAVENOUS | Status: AC
  Administered 2018-10-11: 1 g via INTRAVENOUS
  Filled 2018-10-10: qty 10

## 2018-10-10 MED ORDER — ALBUMIN HUMAN 25 % IV SOLN
12.5000 g | Freq: Once | INTRAVENOUS | Status: AC
  Administered 2018-10-11: 12.5 g via INTRAVENOUS
  Filled 2018-10-10: qty 50

## 2018-10-10 MED ORDER — PANTOPRAZOLE SODIUM 40 MG IV SOLR
40.0000 mg | Freq: Once | INTRAVENOUS | Status: AC
  Administered 2018-10-10: 40 mg via INTRAVENOUS
  Filled 2018-10-10: qty 40

## 2018-10-10 NOTE — ED Provider Notes (Signed)
[facility] EMERGENCY DEPARTMENT Provider Note   CSN: [id number] Arrival date & time: 10/10/18  2055     History   Chief Complaint Chief Complaint  Patient presents with  . GI Bleeding    transfer    HPI Brittany Russo is a 73 y.o. female.  She has history of cirrhosis diabetes hypertension.  She said she had a knee operation at the beginning of this month by Dr. Ihor Gully for arthritis.  She has had a couple of falls since then and was found to have a periprosthetic fracture that was repaired by orthopedics again.  It sounds like she has been at rehab since then and today was brought in to [facility] for evaluation of a low hemoglobin.  There her hemoglobin was 8.  It sounds like they were unable to get a colonoscopy arranged and the patient was transferred here for GI.  Patient continues to have pain in her right knee.  She said she has had rectal bleeding for months usually with bowel movements.     The history is provided by the patient.  GI Problem This is a new problem. The current episode started more than 1 week ago. The problem occurs constantly. The problem has not changed since onset.Pertinent negatives include no chest pain, no abdominal pain, no headaches and no shortness of breath. Nothing aggravates the symptoms. Nothing relieves the symptoms. She has tried nothing for the symptoms. The treatment provided no relief.    Past Medical History:  Diagnosis Date  . Allergy   . Anxiety   . Arthritis   . Cirrhosis of liver ([place]) ~ 2004   from NAFLD.  Dx ~ 2004.  Dr Dorcas Mcmurray at [facility]/    . Depression   . Diabetes mellitus without complication ([place])    type 2  . Eczema of both hands   . Endometrial cancer ([place]) 1996, 2003   hysterectomy 1996.  recurrence 2003, treated with radiation.    [name] Headache   . 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 in [place]  . Hyperlipidemia   . Hypertension    . Seizures ([place])    last seizure June 07, 2014   . Thrombocytopenia ([place])   . Thyroid nodule   . Vaginal cancer [facility])     Patient Active Problem List   Diagnosis Date Noted  . Depression 09/28/2018  . Acute hepatic encephalopathy 09/28/2018  . Non-alcoholic cirrhosis ([place]) 25/95/6387  . Periprosthetic fracture around internal prosthetic right knee joint 09/24/2018  . S/P right TKA 09/22/2018  . Status post total knee replacement, right 09/22/2018  . DM2 (diabetes mellitus, type 2) ([place]) 07/11/2015  . Hypertension 07/11/2015  . Hyperlipidemia 07/11/2015    Past Surgical History:  Procedure Laterality Date  . CATARACT EXTRACTION W/PHACO Left 06/04/2017   Procedure: CATARACT EXTRACTION PHACO AND INTRAOCULAR LENS PLACEMENT (IOC);  Surgeon: Baruch Goldmann, MD;  Location: [facility];  Service: Ophthalmology;  Laterality: Left;  CDE: 3.90  . CATARACT EXTRACTION W/PHACO Right 07/15/2017   Procedure: CATARACT EXTRACTION PHACO AND INTRAOCULAR LENS PLACEMENT (IOC);  Surgeon: Baruch Goldmann, MD;  Location: [facility];  Service: Ophthalmology;  Laterality: Right;  CDE: 7.60  . COLONOSCOPY  06/2007   in [place]. for hematochzia.  radiation proctitis  . DILATION AND CURETTAGE OF UTERUS    . ESOPHAGOGASTRODUODENOSCOPY  2016  . TOTAL ABDOMINAL HYSTERECTOMY  1996  . TOTAL KNEE ARTHROPLASTY Right 09/22/2018  Procedure: TOTAL KNEE ARTHROPLASTY;  Surgeon: Paralee Cancel, MD;  Location: [facility];  Service: Orthopedics;  Laterality: Right;  70 mins  . TOTAL KNEE REVISION Right 09/27/2018   Procedure: TOTAL KNEE REVISION;  Surgeon: Paralee Cancel, MD;  Location: [facility];  Service: Orthopedics;  Laterality: Right;     OB History   No obstetric history on file.      Home Medications    Prior to Admission medications   Medication Sig Start Date End Date Taking? Authorizing Provider  aspirin 81 MG chewable tablet Chew 1 tablet (81 mg total) by mouth 2 (two) times a day for 28 days. 09/30/18 10/28/18  Maurice March,  PA-C  atenolol (TENORMIN) 50 MG tablet Take 50 mg by mouth daily.    [provider]  candesartan (ATACAND) 16 MG tablet Take 1 tablet (16 mg total) by mouth daily for 14 days. 09/30/18 10/14/18  Maurice March, PA-C  celecoxib (CELEBREX) 200 MG capsule Take 1 capsule (200 mg total) by mouth 2 (two) times daily. 09/30/18   Maurice March, PA-C  cephALEXin (KEFLEX) 500 MG capsule Take 1 capsule (500 mg total) by mouth 3 (three) times daily for 28 days. 09/30/18 10/28/18  Maurice March, PA-C  citalopram (CELEXA) 40 MG tablet Take 40 mg by mouth daily.    [provider]  diltiazem (CARTIA XT) 180 MG 24 hr capsule Take 180 mg by mouth daily.    [provider]  ferrous sulfate 325 (65 FE) MG tablet Take 1 tablet (325 mg total) by mouth 2 (two) times daily with a meal for 14 days. 09/23/18 10/07/18  Maurice March, PA-C  fluticasone (FLONASE) 50 MCG/ACT nasal spray Place 1 spray into both nostrils daily.    [provider]  glipiZIDE (GLUCOTROL) 5 MG tablet Take 5 mg by mouth 2 (two) times daily.    [provider]  HYDROcodone-acetaminophen (NORCO/VICODIN) 5-325 MG tablet Take 1 tablet by mouth every 6 (six) hours as needed for moderate pain or severe pain. 09/30/18   Maurice March, PA-C  Insulin Glargine (BASAGLAR KWIKPEN) 100 UNIT/ML SOPN Inject 65 Units into the skin daily.     [provider]  lactulose (CHRONULAC) 10 GM/15ML solution Take 30 mLs (20 g total) by mouth 2 (two) times daily for 14 days. 09/30/18 10/14/18  Maurice March, PA-C  loratadine (CLARITIN) 10 MG tablet Take 10 mg by mouth daily.    [provider]  methocarbamol (ROBAXIN) 500 MG tablet Take 1 tablet (500 mg total) by mouth every 6 (six) hours as needed for muscle spasms. 09/30/18   Maurice March, PA-C  montelukast (SINGULAIR) 10 MG tablet Take 10 mg by mouth at bedtime.    [provider]  mupirocin nasal ointment (BACTROBAN) 2 % Place 1  application into the nose 2 (two) times daily. Use one-half of tube in each nostril twice daily for five (5) days. After application, press sides of nose together and gently massage.    [provider]  ondansetron (ZOFRAN) 4 MG tablet Take 1 tablet (4 mg total) by mouth every 6 (six) hours as needed for nausea. 09/30/18   Maurice March, PA-C  potassium chloride (K-DUR) 10 MEQ tablet Take 10 mEq by mouth daily.    [provider]  simvastatin (ZOCOR) 40 MG tablet Take 40 mg by mouth at bedtime.    [provider]  sitaGLIPtin-metformin (JANUMET) 50-500 MG per tablet Take 1 tablet by mouth  2 (two) times daily with a meal.    [provider]  Ibuprofen-Diphenhydramine Cit (ADVIL PM PO) Take 60 mg by mouth at bedtime.  05/21/17  [provider]    Family History Family History  Problem Relation Age of Onset  . Heart disease Mother   . Diabetes Mother   . Heart disease Father   . Cancer Father   . Cancer Maternal Grandmother   . Heart disease Maternal Grandfather     Social History Social History   Tobacco Use  . Smoking status: Never Smoker  . Smokeless tobacco: Never Used  Substance Use Topics  . Alcohol use: No  . Drug use: No     Allergies   Asa [aspirin] and Other   Review of Systems Review of Systems  Constitutional: Negative for fever.  HENT: Negative for sore throat.   Eyes: Negative for visual disturbance.  Respiratory: Negative for shortness of breath.   Cardiovascular: Negative for chest pain.  Gastrointestinal: Positive for anal bleeding and blood in stool. Negative for abdominal pain.  Genitourinary: Negative for dysuria.  Musculoskeletal: Positive for gait problem.  Skin: Negative for rash.  Neurological: Negative for headaches.     Physical Exam Updated Vital Signs BP (!) 148/69 (BP Location: Left Arm)   Pulse 84   Temp 98.6 F (37 C) (Oral)   Resp 15   Ht 5' 2"  (1.575 m)   Wt 95.1 kg   SpO2 98%   BMI  38.35 kg/m   Physical Exam Vitals signs and nursing note reviewed.  Constitutional:      General: She is not in acute distress.    Appearance: She is well-developed. She is obese.  HENT:     Head: Normocephalic and atraumatic.  Eyes:     Conjunctiva/sclera: Conjunctivae normal.  Neck:     Musculoskeletal: Neck supple.  Cardiovascular:     Rate and Rhythm: Normal rate and regular rhythm.     Heart sounds: No murmur.  Pulmonary:     Effort: Pulmonary effort is normal. No respiratory distress.     Breath sounds: Normal breath sounds.  Abdominal:     Palpations: Abdomen is soft.     Tenderness: There is no abdominal tenderness. There is no guarding.     Hernia: No hernia is present.  Musculoskeletal:     Right lower leg: No edema.     Left lower leg: No edema.  Skin:    General: Skin is warm and dry.     Capillary Refill: Capillary refill takes less than 2 seconds.  Neurological:     General: No focal deficit present.     Mental Status: She is alert and oriented to person, place, and time.      ED Treatments / Results  Labs (all labs ordered are listed, but only abnormal results are displayed) Labs Reviewed  COMPREHENSIVE METABOLIC PANEL - Abnormal; Notable for the following components:      Result Value   Sodium 132 (*)    Glucose, Bld 104 (*)    BUN 24 (*)    Creatinine, Ser 1.51 (*)    Calcium 7.9 (*)    Total Protein 5.0 (*)    Albumin 2.0 (*)    AST 60 (*)    Alkaline Phosphatase 141 (*)    Total Bilirubin 1.4 (*)    GFR calc non Af Amer 35 (*)    GFR calc Af Amer 40 (*)    All other components within  normal limits  CBC WITH DIFFERENTIAL/PLATELET - Abnormal; Notable for the following components:   WBC 3.6 (*)    RBC 2.53 (*)    Hemoglobin 7.7 (*)    HCT 25.2 (*)    RDW 18.6 (*)    Platelets 87 (*)    All other components within normal limits  PROTIME-INR - Abnormal; Notable for the following components:   Prothrombin Time 17.9 (*)    INR 1.5 (*)     All other components within normal limits  URINALYSIS, ROUTINE W REFLEX MICROSCOPIC - Abnormal; Notable for the following components:   APPearance HAZY (*)    Hgb urine dipstick SMALL (*)    Leukocytes,Ua MODERATE (*)    WBC, UA >50 (*)    Bacteria, UA RARE (*)    All other components within normal limits  RAPID URINE DRUG SCREEN, HOSP PERFORMED - Abnormal; Notable for the following components:   Opiates POSITIVE (*)    Benzodiazepines POSITIVE (*)    All other components within normal limits  AMMONIA - Abnormal; Notable for the following components:   Ammonia 61 (*)    All other components within normal limits  IRON AND TIBC - Abnormal; Notable for the following components:   Iron 24 (*)    Saturation Ratios 7 (*)    All other components within normal limits  RETICULOCYTES - Abnormal; Notable for the following components:   Retic Ct Pct 4.5 (*)    RBC. 2.50 (*)    All other components within normal limits  PREALBUMIN - Abnormal; Notable for the following components:   Prealbumin 6.0 (*)    All other components within normal limits  GLUCOSE, CAPILLARY - Abnormal; Notable for the following components:   Glucose-Capillary 122 (*)    All other components within normal limits  COMPREHENSIVE METABOLIC PANEL - Abnormal; Notable for the following components:   Sodium 132 (*)    CO2 19 (*)    Glucose, Bld 123 (*)    Creatinine, Ser 1.47 (*)    Calcium 7.8 (*)    Total Protein 4.9 (*)    Albumin 2.0 (*)    AST 74 (*)    Alkaline Phosphatase 141 (*)    GFR calc non Af Amer 36 (*)    GFR calc Af Amer 41 (*)    All other components within normal limits  CBC - Abnormal; Notable for the following components:   WBC 3.6 (*)    RBC 2.58 (*)    Hemoglobin 7.8 (*)    HCT 25.2 (*)    RDW 18.6 (*)    Platelets 80 (*)    All other components within normal limits  CBG MONITORING, ED - Abnormal; Notable for the following components:   Glucose-Capillary 130 (*)    All other components  within normal limits  SARS CORONAVIRUS 2 (HOSPITAL ORDER, [place] [facility])  VITAMIN B12  FOLATE  FERRITIN  OSMOLALITY  MAGNESIUM  PHOSPHORUS  TSH  GLUCOSE, CAPILLARY  CREATININE, URINE, RANDOM  SODIUM, URINE, RANDOM  OSMOLALITY, URINE  CBC  CBG MONITORING, ED  TYPE AND SCREEN  ABO/RH    EKG EKG Interpretation  Date/Time:  Monday October 10 2018 21:01:23 EDT Ventricular Rate:  85 PR Interval:    QRS Duration: 96 QT Interval:  401 QTC Calculation: 477 R Axis:   -18 Text Interpretation:  Sinus rhythm Borderline left axis deviation Low voltage, precordial leads Consider anterior infarct similar to prior 3/19 Confirmed by Melina Copa,  Legrand Como [phone]) on 10/10/2018 9:09:57 PM   Radiology No results found.  Procedures Procedures (including critical care time)  Medications Ordered in ED Medications  simvastatin (ZOCOR) tablet 40 mg (40 mg Oral Given 10/11/18 0351)  citalopram (CELEXA) tablet 40 mg (has no administration in time range)  methocarbamol (ROBAXIN) tablet 500 mg (has no administration in time range)  loratadine (CLARITIN) tablet 10 mg (has no administration in time range)  montelukast (SINGULAIR) tablet 10 mg (10 mg Oral Given 10/11/18 0351)  acetaminophen (TYLENOL) tablet 650 mg (has no administration in time range)    Or  acetaminophen (TYLENOL) suppository 650 mg (has no administration in time range)  ondansetron (ZOFRAN) tablet 4 mg (has no administration in time range)    Or  ondansetron (ZOFRAN) injection 4 mg (has no administration in time range)  insulin aspart (novoLOG) injection 0-9 Units (0 Units Subcutaneous Not Given 10/11/18 0823)  sodium chloride flush (NS) 0.9 % injection 3 mL (3 mLs Intravenous Given 10/11/18 0354)  sodium chloride flush (NS) 0.9 % injection 3 mL (has no administration in time range)  0.9 %  sodium chloride infusion ( Intravenous Stopped 10/11/18 0444)  HYDROcodone-acetaminophen (NORCO/VICODIN) 5-325 MG per tablet 1-2  tablet (2 tablets Oral Given 10/11/18 0037)  cefTRIAXone (ROCEPHIN) 1 g in sodium chloride 0.9 % 100 mL IVPB (0 g Intravenous Stopped 10/11/18 0036)  pantoprazole (PROTONIX) injection 40 mg (40 mg Intravenous Given 10/10/18 2313)  albumin human 25 % solution 12.5 g ( Intravenous Rate/Dose Verify 10/11/18 0500)     Initial Impression / Assessment and Plan / ED Course  I have reviewed the triage vital signs and the nursing notes.  Pertinent labs & imaging results that were available during my care of the patient were reviewed by me and considered in my medical decision making (see chart for details).  Clinical Course as of Oct 11 838  Mon Oct 10, 2018  2139 Patient's ED note from [facility] comments upon rectal exam with current jelly stool in vault.  It sounds like they could not get any type of colonoscopy done at the facility and so they talk to [facility] and ultimately it sounds like Dr. Laren Everts hospitalist accepted the patient for admission.  For some reason the patient is now in the ED.   [MB]  2155 We were able to figure out that the patient was discussed with Dr. Laren Everts who refused direct admission and it still unclear whether the ED was contacted before the patient was transferred here.  The recommendations were for the patient to get a CT abdomen and pelvis which was not done at the outside facility.  She has a Foley in place and is draining clear yellow urine.  Her right lower extremity has a knee immobilizer that she has been had since going to rehab.   [MB]  2241 Discussed with Dr. Lyndel Safe from [facility].  He was hoping to get a CAT scan but with the patient's elevated creatinine and he said that could wait until they see her tomorrow.  He is recommending a PPI and ceftriaxone for her cirrhosis.  They will consult on her tomorrow for possible endoscopy colonoscopy.   [MB]  2314 Discussed with Dr. Roel Cluck from the hospitalist service who accepts the patient for admission.   [MB]     Clinical Course User Index [MB] Hayden Rasmussen, MD   [name] was evaluated in Emergency Department on 10/10/2018 for the symptoms described in the history  of present illness. She was evaluated in the context of the global COVID-19 pandemic, which necessitated consideration that the patient might be at risk for infection with the SARS-CoV-2 virus that causes COVID-19. Institutional protocols and algorithms that pertain to the evaluation of patients at risk for COVID-19 are in a state of rapid change based on information released by regulatory bodies including the CDC and federal and state organizations. These policies and algorithms were followed during the patient's care in the ED.      Final Clinical Impressions(s) / ED Diagnoses   Final diagnoses:  Rectal bleeding  Hepatic cirrhosis, unspecified hepatic cirrhosis type, unspecified whether ascites present [facility])    ED Discharge Orders    None       Hayden Rasmussen, MD 10/11/18 [phone]

## 2018-10-10 NOTE — ED Triage Notes (Signed)
Pt transferred from [facility] with c/o GI bleed that is now controlled; pt is needing GI consult per [employer]; Pt has a R femur fracture and was reported to be hypoglycemic; Hx of cirrhosis of liver and ESRD; 126/74; 86 (SR); 100% on RA; 18

## 2018-10-10 NOTE — H&P (Signed)
Brittany Russo BHA:[id number] DOB: 1945/08/06 DOA: 10/10/2018     PCP: Rosalee Kaufman, PA-C   Outpatient Specialists:   CARDS:   Dr. Ree Kida Dr. Alvan Dame Patient arrived to ER on 10/10/18 at 2055  Patient coming from:   From facility [facility]  Chief Complaint:  Chief Complaint  Patient presents with   GI Bleeding    transfer    HPI: Brittany Russo is a 73 y.o. female with medical history significant of cirrhosis of the liver, depression, diabetes type 2, endometrial cancer status post radiation therapy, HLD, HTN, seizures thyroid nodule, vaginal cancer  Presented with from rehab where she was found to have low hemoglobin.  Patient does endorse abdominal discomfort and distention no fevers or chills no shortness of breath or cough no chest pain.  Seen admitted by Dr. Alvan Dame for closed fracture of distal femur beginning of July 2 weeks ago Patient initially had total knee replacement on Thursday on 22 September 2018 she did well and was able to go home on 3 July but then had a traumatic fall and fractured her femur with severe comminuted distal femur fracture and dislocation of the femoral component she had to be admitted to the hospital and was finally discharged on 10 July Hospitalization was complicated by hepatic encephalopathy She was finally been able to be discharged to rehab And today was brought to [facility] for evaluation of low hemoglobin at that time her hemoglobin was 8 Plan was for patient to be transferred to [facility] for GI consultation She also endorsed that she have had bright red blood per rectum is been going on for months and usually associated with bowel movements. But she was not sure if its coming form vagina or rectum.    She has been on Xarelto ever since since her surgery  She was recently diagnosed with UTI at the rehab and treated with cefalexin  She reports she used to take lactulose but has not been recently while she has  been at rehab  In ER noted to have   Infectious risk factors:  Reports none   In  ER RAPID COVID TEST NEGATIVE    Regarding pertinent Chronic problems:     Hyperlipidemia -  on Lipitor   HTN on Atenalol, Atacand, diltiazem    DM 2 -  Lab Results  Component Value Date   HGBA1C 6.2 (H) 09/22/2018   on insulin, PO meds glipizide    obesity-   BMI Readings from Last 1 Encounters:  10/10/18 38.35 kg/m    While in ER:  The following Work up has been ordered so far:  Orders Placed This Encounter  Procedures   SARS Coronavirus 2 (CEPHEID - Performed in [place] hospital lab), Hosp Order   Comprehensive metabolic panel   CBC with Differential   Protime-INR   Urinalysis, Routine w reflex microscopic   Urine rapid drug screen (hosp performed)   Ammonia   Vitamin B12   Folate   Iron and TIBC   Ferritin   Reticulocytes   Creatinine, urine, random   Sodium, urine, random   Prealbumin   Osmolality, urine   Osmolality   CBC   Cardiac monitoring   Consult to gastroenterology  ALL PATIENTS BEING ADMITTED/HAVING PROCEDURES NEED COVID-19 SCREENING   Consult to hospitalist  ALL PATIENTS BEING ADMITTED/HAVING PROCEDURES NEED COVID-19 SCREENING   Consult to dietitian   Nutritional services consult   Consult to  diabetes coordinator   Social work consult   CBG monitoring, ED   CBG monitoring, ED   EKG 12-Lead   ED EKG   Type and screen [place]   ABO/Rh   Admit to Inpatient (patient's expected length of stay will be greater than 2 midnights or inpatient only procedure)     Following Medications were ordered in ER: Medications  cefTRIAXone (ROCEPHIN) 1 g in sodium chloride 0.9 % 100 mL IVPB (1 g Intravenous New Bag/Given 10/11/18 0006)  albumin human 25 % solution 12.5 g (has no administration in time range)  pantoprazole (PROTONIX) injection 40 mg (40 mg Intravenous Given 10/10/18 2313)        Consult Orders  (From  admission, onward)         Start     Ordered   10/11/18 0018  Nutritional services consult  Once    Provider:  (Not yet assigned)  Question:  Reason for Consult?  Answer:  malnutrition   10/11/18 0018   10/11/18 0018  Social work consult  Once    Provider:  (Not yet assigned)  Question:  Reason for Consult?  Answer:  admitted from facility   10/11/18 0018   10/11/18 0000  Consult to dietitian  Once    Provider:  (Not yet assigned)  Question:  Reason for consult?  Answer:  Assessment of nutrition requirements/status   10/10/18 2359   10/10/18 2242  Consult to hospitalist  ALL PATIENTS BEING ADMITTED/HAVING PROCEDURES NEED COVID-19 SCREENING Paged [facility], roxanne  Once    Comments: ALL PATIENTS BEING ADMITTED/HAVING PROCEDURES NEED COVID-19 SCREENING  Provider:  (Not yet assigned)  Question Answer Comment  Place call to: [facility]   Reason for Consult Admit      10/10/18 2241          ER Provider Called:     Dr. Lyndel Safe They Recommend admit to medicine   Will see in AM treat with protonix and a dose of Antibiotics   Significant initial  Findings: Abnormal Labs Reviewed  COMPREHENSIVE METABOLIC PANEL - Abnormal; Notable for the following components:      Result Value   Sodium 132 (*)    Glucose, Bld 104 (*)    BUN 24 (*)    Creatinine, Ser 1.51 (*)    Calcium 7.9 (*)    Total Protein 5.0 (*)    Albumin 2.0 (*)    AST 60 (*)    Alkaline Phosphatase 141 (*)    Total Bilirubin 1.4 (*)    GFR calc non Af Amer 35 (*)    GFR calc Af Amer 40 (*)    All other components within normal limits  CBC WITH DIFFERENTIAL/PLATELET - Abnormal; Notable for the following components:   WBC 3.6 (*)    RBC 2.53 (*)    Hemoglobin 7.7 (*)    HCT 25.2 (*)    RDW 18.6 (*)    Platelets 87 (*)    All other components within normal limits  PROTIME-INR - Abnormal; Notable for the following components:   Prothrombin Time 17.9 (*)    INR 1.5 (*)    All other components within normal  limits  URINALYSIS, ROUTINE W REFLEX MICROSCOPIC - Abnormal; Notable for the following components:   APPearance HAZY (*)    Hgb urine dipstick SMALL (*)    Leukocytes,Ua MODERATE (*)    WBC, UA >50 (*)    Bacteria, UA RARE (*)    All other components  within normal limits  RAPID URINE DRUG SCREEN, HOSP PERFORMED - Abnormal; Notable for the following components:   Opiates POSITIVE (*)    Benzodiazepines POSITIVE (*)    All other components within normal limits  CBG MONITORING, ED - Abnormal; Notable for the following components:   Glucose-Capillary 130 (*)    All other components within normal limits    Otherwise labs showing:    Recent Labs  Lab 10/10/18 2127  NA 132*  K 4.5  CO2 22  GLUCOSE 104*  BUN 24*  CREATININE 1.51*  CALCIUM 7.9*    Cr    Up from baseline see below Lab Results  Component Value Date   CREATININE 1.51 (H) 10/10/2018   CREATININE 0.79 09/29/2018   CREATININE 2.84 (H) 09/29/2018   CREATININE TEST CANCELLED PER MD 09/29/2018    Recent Labs  Lab 10/10/18 2127  AST 60*  ALT 42  ALKPHOS 141*  BILITOT 1.4*  PROT 5.0*  ALBUMIN 2.0*   Lab Results  Component Value Date   CALCIUM 7.9 (L) 10/10/2018   PHOS 3.7 09/29/2018    WBC      Component Value Date/Time   WBC 3.6 (L) 10/10/2018 2127   ANC    Component Value Date/Time   NEUTROABS 2.2 10/10/2018 2127   ALC No results found for: LYMPHOABS    Plt: Lab Results  Component Value Date   PLT 87 (L) 10/10/2018     Lactic Acid, Venous No results found for: LATICACIDVEN   COVID-19 Labs    Lab Results  Component Value Date   [place] 10/10/2018   [place] NEGATIVE 09/30/2018   [place] NEGATIVE 09/24/2018   [place] NEGATIVE 09/19/2018     HG/HCT   Down   from baseline see below    Component Value Date/Time   HGB 7.7 (L) 10/10/2018 2127   HCT 25.2 (L) 10/10/2018 2127    DM  labs:  HbA1C: Recent Labs    09/22/18 0540  HGBA1C 6.2*     CBG (last  3)  Recent Labs    10/10/18 2150 10/10/18 2234  GLUCAP 86 130*       UA   no evidence of UTI      Urine analysis:    Component Value Date/Time   COLORURINE YELLOW 10/10/2018 2143   APPEARANCEUR HAZY (A) 10/10/2018 2143   LABSPEC 1.010 10/10/2018 2143   PHURINE 5.0 10/10/2018 2143   GLUCOSEU NEGATIVE 10/10/2018 2143   HGBUR SMALL (A) 10/10/2018 2143   BILIRUBINUR NEGATIVE 10/10/2018 2143   BILIRUBINUR NEGATIVE 03/06/2013 1100   KETONESUR NEGATIVE 10/10/2018 2143   PROTEINUR NEGATIVE 10/10/2018 2143   UROBILINOGEN negative 03/06/2013 1100   NITRITE NEGATIVE 10/10/2018 2143   LEUKOCYTESUR MODERATE (A) 10/10/2018 2143       ECG:  Personally reviewed by me showing: HR : 85 Rhythm:  NSR,    no evidence of ischemic changes QTC 477     ED Triage Vitals  Enc Vitals Group     BP 10/10/18 2100 (!) 148/69     Pulse Rate 10/10/18 2100 85     Resp 10/10/18 2100 (!) 23     Temp 10/10/18 2102 98.6 F (37 C)     Temp Source 10/10/18 2102 Oral     SpO2 10/10/18 2100 95 %     Weight 10/10/18 2105 209 lb 10.5 oz (95.1 kg)     Height 10/10/18 2105 5' 2"  (1.575 m)     Head Circumference --  Peak Flow --      Pain Score 10/10/18 2136 0     Pain Loc --      Pain Edu? --      Excl. in [place]? --   TMAX(24)@       Latest  Blood pressure 112/70, pulse 87, temperature 98.6 F (37 C), temperature source Oral, resp. rate 16, height 5' 2"  (1.575 m), weight 95.1 kg, SpO2 97 %.     Hospitalist was called for admission for gi bleeding    Review of Systems:    Pertinent positives include:  fatigue,abdominal pain, nausea  blood in diaper Bilateral lower extremity swelling  Constitutional:  No weight loss, night sweats, Fevers, chills, weight loss  HEENT:  No headaches, Difficulty swallowing,Tooth/dental problems,Sore throat,  No sneezing, itching, ear ache, nasal congestion, post nasal drip,  Cardio-vascular:  No chest pain, Orthopnea, PND, anasarca, dizziness,  palpitations.no GI:  No heartburn, indigestion, , vomiting, diarrhea, change in bowel habits, loss of appetite, melena,, hematemesis Resp:  no shortness of breath at rest. No dyspnea on exertion, No excess mucus, no productive cough, No non-productive cough, No coughing up of blood.No change in color of mucus.No wheezing. Skin:  no rash or lesions. No jaundice GU:  no dysuria, change in color of urine, no urgency or frequency. No straining to urinate.  No flank pain.  Musculoskeletal:  No joint pain or no joint swelling. No decreased range of motion. No back pain.  Psych:  No change in mood or affect. No depression or anxiety. No memory loss.  Neuro: no localizing neurological complaints, no tingling, no weakness, no double vision, no gait abnormality, no slurred speech, no confusion  All systems reviewed and apart from [place] all are negative  Past Medical History:   Past Medical History:  Diagnosis Date   Allergy    Anxiety    Arthritis    Cirrhosis of liver ([place]) ~ 2004   from NAFLD.  Dx ~ 2004.  Dr Dorcas Mcmurray at [facility]/     Depression    Diabetes mellitus without complication [facility])    type 2   Eczema of both hands    Endometrial cancer ([place]) 1996, 2003   hysterectomy 1996.  recurrence 2003, treated with radiation.     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, DR Allyson Sabal Mir at [place] in [place]   Hyperlipidemia    Hypertension    Seizures [facility])    last seizure June 07, 2014    Thrombocytopenia [facility])    Thyroid nodule    Vaginal cancer [facility])       Past Surgical History:  Procedure Laterality Date   CATARACT EXTRACTION W/PHACO Left 06/04/2017   Procedure: CATARACT EXTRACTION PHACO AND INTRAOCULAR LENS PLACEMENT (IOC);  Surgeon: Baruch Goldmann, MD;  Location: [facility];  Service: Ophthalmology;  Laterality: Left;  CDE: 3.90   CATARACT EXTRACTION W/PHACO Right 07/15/2017   Procedure: CATARACT EXTRACTION PHACO  AND INTRAOCULAR LENS PLACEMENT (IOC);  Surgeon: Baruch Goldmann, MD;  Location: [facility];  Service: Ophthalmology;  Laterality: Right;  CDE: 7.60   COLONOSCOPY  06/2007   in [place]. for hematochzia.  radiation proctitis   DILATION AND CURETTAGE OF UTERUS     ESOPHAGOGASTRODUODENOSCOPY  2016   TOTAL ABDOMINAL HYSTERECTOMY  1996   TOTAL KNEE ARTHROPLASTY Right 09/22/2018   Procedure: TOTAL KNEE ARTHROPLASTY;  Surgeon: Paralee Cancel, MD;  Location: [facility];  Service: Orthopedics;  Laterality: Right;  70 mins  TOTAL KNEE REVISION Right 09/27/2018   Procedure: TOTAL KNEE REVISION;  Surgeon: Paralee Cancel, MD;  Location: [facility];  Service: Orthopedics;  Laterality: Right;    Social History:  Ambulatory  bed bound    reports that she has never smoked. She has never used smokeless tobacco. She reports that she does not drink alcohol or use drugs.     Family History:   Family History  Problem Relation Age of Onset   Heart disease Mother    Diabetes Mother    Heart disease Father    Cancer Father    Cancer Maternal Grandmother    Heart disease Maternal Grandfather     Allergies: Allergies  Allergen Reactions   Asa [Aspirin]     Pt not allergic but cannot take due to bleeding    Other     Any jewelry metal-hives and itching      Prior to Admission medications   Medication Sig Start Date End Date Taking? Authorizing Provider  aspirin 81 MG chewable tablet Chew 1 tablet (81 mg total) by mouth 2 (two) times a day for 28 days. 09/30/18 10/28/18  Maurice March, PA-C  atenolol (TENORMIN) 50 MG tablet Take 50 mg by mouth daily.    [provider]  candesartan (ATACAND) 16 MG tablet Take 1 tablet (16 mg total) by mouth daily for 14 days. 09/30/18 10/14/18  Maurice March, PA-C  celecoxib (CELEBREX) 200 MG capsule Take 1 capsule (200 mg total) by mouth 2 (two) times daily. 09/30/18   Maurice March, PA-C  cephALEXin (KEFLEX) 500 MG capsule Take 1 capsule (500 mg total) by  mouth 3 (three) times daily for 28 days. 09/30/18 10/28/18  Maurice March, PA-C  citalopram (CELEXA) 40 MG tablet Take 40 mg by mouth daily.    [provider]  diltiazem (CARTIA XT) 180 MG 24 hr capsule Take 180 mg by mouth daily.    [provider]  ferrous sulfate 325 (65 FE) MG tablet Take 1 tablet (325 mg total) by mouth 2 (two) times daily with a meal for 14 days. 09/23/18 10/07/18  Maurice March, PA-C  fluticasone (FLONASE) 50 MCG/ACT nasal spray Place 1 spray into both nostrils daily.    [provider]  glipiZIDE (GLUCOTROL) 5 MG tablet Take 5 mg by mouth 2 (two) times daily.    [provider]  HYDROcodone-acetaminophen (NORCO/VICODIN) 5-325 MG tablet Take 1 tablet by mouth every 6 (six) hours as needed for moderate pain or severe pain. 09/30/18   Maurice March, PA-C  Insulin Glargine (BASAGLAR KWIKPEN) 100 UNIT/ML SOPN Inject 65 Units into the skin daily.     [provider]  lactulose (CHRONULAC) 10 GM/15ML solution Take 30 mLs (20 g total) by mouth 2 (two) times daily for 14 days. 09/30/18 10/14/18  Maurice March, PA-C  loratadine (CLARITIN) 10 MG tablet Take 10 mg by mouth daily.    [provider]  methocarbamol (ROBAXIN) 500 MG tablet Take 1 tablet (500 mg total) by mouth every 6 (six) hours as needed for muscle spasms. 09/30/18   Maurice March, PA-C  montelukast (SINGULAIR) 10 MG tablet Take 10 mg by mouth at bedtime.    [provider]  mupirocin nasal ointment (BACTROBAN) 2 % Place 1 application into the nose 2 (two) times daily. Use one-half of tube in each nostril twice daily for five (5) days. After application, press sides of nose together and gently massage.  [provider]  ondansetron (ZOFRAN) 4 MG tablet Take 1 tablet (4 mg total) by mouth every 6 (six) hours as needed for nausea. 09/30/18   Maurice March, PA-C  potassium chloride (K-DUR) 10 MEQ tablet Take 10 mEq by mouth daily.     [provider]  simvastatin (ZOCOR) 40 MG tablet Take 40 mg by mouth at bedtime.    [provider]  sitaGLIPtin-metformin (JANUMET) 50-500 MG per tablet Take 1 tablet by mouth 2 (two) times daily with a meal.    [provider]  Ibuprofen-Diphenhydramine Cit (ADVIL PM PO) Take 60 mg by mouth at bedtime.  05/21/17  [provider]   Physical Exam: Blood pressure 112/70, pulse 87, temperature 98.6 F (37 C), temperature source Oral, resp. rate 16, height 5' 2"  (1.575 m), weight 95.1 kg, SpO2 97 %. 1. General:  in No Acute distress   Chronically ill  -appearing 2. Psychological: Alert and   Oriented 3. Head/ENT:   Moist  Mucous Membranes                          Head Non traumatic, neck supple                           Poor Dentition 4. SKIN: normal   Skin turgor,  Skin clean Dry and intact no rash 5. Heart: Regular rate and rhythm no Murmur, no Rub or gallop 6. Lungs:   no wheezes or crackles   7. Abdomen: Soft,  non-tender, distended  obese  bowel sounds present 8. Lower extremities: no clubbing, cyanosis, 3+ edema 9. Neurologically Grossly intact, moving all 4 extremities equally  10. MSK: Normal range of motion except right leg due to fracture   All other LABS:     Recent Labs  Lab 10/10/18 2127  WBC 3.6*  NEUTROABS 2.2  HGB 7.7*  HCT 25.2*  MCV 99.6  PLT 87*     Recent Labs  Lab 10/10/18 2127  NA 132*  K 4.5  CL 105  CO2 22  GLUCOSE 104*  BUN 24*  CREATININE 1.51*  CALCIUM 7.9*     Recent Labs  Lab 10/10/18 2127  AST 60*  ALT 42  ALKPHOS 141*  BILITOT 1.4*  PROT 5.0*  ALBUMIN 2.0*    Cultures:    Component Value Date/Time   SDES  09/22/2018 0718    URINE, CLEAN CATCH Performed at [facility], [place] [address], [place], [place] [address]    SPECREQUEST  09/22/2018 0258    NONE Performed at [facility], [place] [address] [place], [place] [address]    CULT (A) 09/22/2018 2423     <10,000 COLONIES/mL INSIGNIFICANT GROWTH Performed at [facility] [address] [place], [place] [address]    REPTSTATUS 09/23/2018 FINAL 09/22/2018 4315     Radiological Exams on Admission: No results found.  Chart has been reviewed   Assessment/Plan   73 y.o. female with medical history significant of cirrhosis of the liver, depression, diabetes type 2, endometrial cancer status post radiation therapy, HLD, HTN, seizures thyroid nodule, vaginal cancer Admitted for bright red blood per rectum anemia in the setting of end-stage liver disease and history of vaginal cancer with recent knee replacement currently on anticoagulation  Present on Admission:  Lower GI bleed - - Suspect Lower Gi source  No hx of PUD,  melena,   -  Admit  For further management given:   Age >60 years,  comorbid illnesses  ,  exposure to antiplatelet drugs and anticoagulants. -  painless bleeding making colitis less likely GI would like to reevaluate in a.m. and then decide if CT useful    -  ER  Provider spoke to gastroenterology ( [facility]) they will see patient in a.m. appreciate their consult   - serial CBC.    - Monitor for any recurrence,  evidence of hemodynamic instability or significant blood loss -  type and screen,  - Transfuse as needed for hemoglobin below 7 or <9 if evidence of significant  bleeding  - Establish at least 2 PIV     - clear liquids for tonight keep nothing by mouth post midnight,  -  monitor for Recurrent significant  Bleeding of red blood and hemodynamic instability in which case Bleeding scan and IR consult would be indicated. -Per GI give a dose of Protonix and Rocephin given liver disease Symptomatic anemia will obtain anemia panel transfuse as needed  Depression stable - continue home medications  Hyperlipidemia - stable continue home medications  Hypertension - allow some permissive hypertension given GI bleeding  Non-alcoholic cirrhosis (HCC) - MELD score of 16  with 6% estimated 38-monthmortality Given hyponatremia, hypoalbuminemia and rising creatinine worrisome for poor prognosis.  Also noted to have low platelet count patient is at high risk of complications.  Appreciate GI consult.  Administer albumin as patient has significant edema low albumin  Check ammonia level  For baseline Currently no evidence of asterixis   Anasarca -possibly related to hypoalbuminemia and worsening renal function. For right now given worsening creatinine and no evidence of pulmonary edema will hold off on diuresis   Hypoalbuminemia check nutritional status check prealbumin, administer albumin to see if that would help with edema and fluid retention as well as improve renal function  AKI (acute kidney injury) ([place] avoid nephrotoxic medications will evaluate for hepatorenal syndrome obtain urine electrolytes  Dm 2 -  - Order Sensitive  SSI    -  check TSH and HgA1C  - Hold by mouth medications  Patient be worsening renal function and episodes of hypoglycemia while outside facility blood sugar dropping into mid 80s.  Hold off on glargine for tonight As blood sugar has been trending very low we will need to reassess insulin requirement. Restart when blood sugar stabilizes possibly at the lower dose Order diabetic coordinator consult  Other plan as per orders.  DVT prophylaxis:  SCD    Code Status:  FULL CODE  as per patient   I had personally discussed CODE STATUS with patient  Family Communication:   Family not at  Bedside    Disposition Plan:                              Back to current facility when stable                         Would benefit from PT/OT eval prior to DC  Ordered                                     Social Work  consulted                   Nutrition  consulted                                           Consults called: [facility]   Admission status:  ED Disposition    ED Disposition Condition [place]: [place] [100100]  Level of Care: Telemetry Medical [797]  Covid Evaluation: Confirmed COVID Negative  Diagnosis: Lower GI bleed [282060]  Admitting Physician: Toy Baker [[id number]]  Attending Physician: Toy Baker [[id number]]  Estimated length of stay: 3 - 4 days  Certification:: I certify this patient will need inpatient services for at least 2 midnights  PT Class (Do Not Modify): Inpatient [101]  PT Acc Code (Do Not Modify): Private [1]          inpatient     Expect 2 midnight stay secondary to severity of patient's current illness including     Severe lab/radiological/exam abnormalities including:  anemia   and extensive comorbidities including: DM2 , liver disease    malignancy,    anticoagulation  That are currently affecting medical management.   I expect  patient to be hospitalized for 2 midnights requiring inpatient medical care.  Patient is at high risk for adverse outcome (such as loss of life or disability) if not treated.  Indication for inpatient stay as follows:    inability to maintain oral hydration    Need for operative/procedural  intervention    Need for IV antibiotics, IV  medications    Level of care    tele  For  24H    Precautions:  NONE  No active isolations  PPE: Used by the provider:   P100  eye Goggles,  Gloves       Brittany Russo 10/11/2018, 12:19 AM    [facility]     after 2 AM please page floor coverage PA If 7AM-7PM, please contact the day team taking care of the patient using [email or web address]

## 2018-10-10 NOTE — ED Notes (Signed)
Husband- [place], [phone]

## 2018-10-10 NOTE — ED Notes (Signed)
Pt given cup of orange juice and crackers per MD

## 2018-10-11 ENCOUNTER — Inpatient Hospital Stay (HOSPITAL_COMMUNITY): Payer: Medicare Other

## 2018-10-11 ENCOUNTER — Encounter (HOSPITAL_COMMUNITY): Payer: Self-pay | Admitting: Physician Assistant

## 2018-10-11 ENCOUNTER — Other Ambulatory Visit: Payer: Self-pay

## 2018-10-11 DIAGNOSIS — E8809 Other disorders of plasma-protein metabolism, not elsewhere classified: Secondary | ICD-10-CM

## 2018-10-11 DIAGNOSIS — K922 Gastrointestinal hemorrhage, unspecified: Secondary | ICD-10-CM

## 2018-10-11 DIAGNOSIS — R601 Generalized edema: Secondary | ICD-10-CM

## 2018-10-11 DIAGNOSIS — K7460 Unspecified cirrhosis of liver: Secondary | ICD-10-CM

## 2018-10-11 DIAGNOSIS — Z96651 Presence of right artificial knee joint: Secondary | ICD-10-CM

## 2018-10-11 DIAGNOSIS — N179 Acute kidney failure, unspecified: Secondary | ICD-10-CM | POA: Diagnosis present

## 2018-10-11 LAB — ABO/RH: ABO/RH(D): A POS

## 2018-10-11 LAB — COMPREHENSIVE METABOLIC PANEL
ALT: 41 U/L (ref 0–44)
AST: 74 U/L — ABNORMAL HIGH (ref 15–41)
Albumin: 2 g/dL — ABNORMAL LOW (ref 3.5–5.0)
Alkaline Phosphatase: 141 U/L — ABNORMAL HIGH (ref 38–126)
Anion gap: 8 (ref 5–15)
BUN: 23 mg/dL (ref 8–23)
CO2: 19 mmol/L — ABNORMAL LOW (ref 22–32)
Calcium: 7.8 mg/dL — ABNORMAL LOW (ref 8.9–10.3)
Chloride: 105 mmol/L (ref 98–111)
Creatinine, Ser: 1.47 mg/dL — ABNORMAL HIGH (ref 0.44–1.00)
GFR calc Af Amer: 41 mL/min — ABNORMAL LOW (ref >60)
GFR calc non Af Amer: 36 mL/min — ABNORMAL LOW (ref >60)
Glucose, Bld: 123 mg/dL — ABNORMAL HIGH (ref 70–99)
Potassium: 5.1 mmol/L (ref 3.5–5.1)
Sodium: 132 mmol/L — ABNORMAL LOW (ref 135–145)
Total Bilirubin: 1 mg/dL (ref 0.3–1.2)
Total Protein: 4.9 g/dL — ABNORMAL LOW (ref 6.5–8.1)

## 2018-10-11 LAB — SODIUM, URINE, RANDOM: Sodium, Ur: 30 mmol/L

## 2018-10-11 LAB — CBC
HCT: 25.2 % — ABNORMAL LOW (ref 36.0–46.0)
HCT: 24.5 % — ABNORMAL LOW (ref 36.0–46.0)
Hemoglobin: 7.8 g/dL — ABNORMAL LOW (ref 12.0–15.0)
Hemoglobin: 7.7 g/dL — ABNORMAL LOW (ref 12.0–15.0)
MCH: 30.2 pg (ref 26.0–34.0)
MCH: 30.6 pg (ref 26.0–34.0)
MCHC: 31 g/dL (ref 30.0–36.0)
MCHC: 31.4 g/dL (ref 30.0–36.0)
MCV: 97.7 fL (ref 80.0–100.0)
MCV: 97.2 fL (ref 80.0–100.0)
Platelets: 80 10*3/uL — ABNORMAL LOW (ref 150–400)
Platelets: 82 10*3/uL — ABNORMAL LOW (ref 150–400)
RBC: 2.58 MIL/uL — ABNORMAL LOW (ref 3.87–5.11)
RBC: 2.52 MIL/uL — ABNORMAL LOW (ref 3.87–5.11)
RDW: 18.6 % — ABNORMAL HIGH (ref 11.5–15.5)
RDW: 18.6 % — ABNORMAL HIGH (ref 11.5–15.5)
WBC: 3.6 10*3/uL — ABNORMAL LOW (ref 4.0–10.5)
WBC: 3.2 10*3/uL — ABNORMAL LOW (ref 4.0–10.5)
nRBC: 0 % (ref 0.0–0.2)
nRBC: 0 % (ref 0.0–0.2)

## 2018-10-11 LAB — GLUCOSE, CAPILLARY
Glucose-Capillary: 101 mg/dL — ABNORMAL HIGH (ref 70–99)
Glucose-Capillary: 116 mg/dL — ABNORMAL HIGH (ref 70–99)
Glucose-Capillary: 116 mg/dL — ABNORMAL HIGH (ref 70–99)
Glucose-Capillary: 122 mg/dL — ABNORMAL HIGH (ref 70–99)
Glucose-Capillary: 133 mg/dL — ABNORMAL HIGH (ref 70–99)
Glucose-Capillary: 179 mg/dL — ABNORMAL HIGH (ref 70–99)
Glucose-Capillary: 89 mg/dL (ref 70–99)
Glucose-Capillary: 91 mg/dL (ref 70–99)

## 2018-10-11 LAB — OSMOLALITY: Osmolality: 282 mOsm/kg (ref 275–295)

## 2018-10-11 LAB — FERRITIN: Ferritin: 56 ng/mL (ref 11–307)

## 2018-10-11 LAB — TSH: TSH: 3.442 u[IU]/mL (ref 0.350–4.500)

## 2018-10-11 LAB — IRON AND TIBC
Iron: 24 ug/dL — ABNORMAL LOW (ref 28–170)
Saturation Ratios: 7 % — ABNORMAL LOW (ref 10.4–31.8)
TIBC: 325 ug/dL (ref 250–450)
UIBC: 301 ug/dL

## 2018-10-11 LAB — OSMOLALITY, URINE: Osmolality, Ur: 352 mOsm/kg (ref 300–900)

## 2018-10-11 LAB — RETICULOCYTES
Immature Retic Fract: 15.6 % (ref 2.3–15.9)
RBC.: 2.5 MIL/uL — ABNORMAL LOW (ref 3.87–5.11)
Retic Count, Absolute: 113.3 10*3/uL (ref 19.0–186.0)
Retic Ct Pct: 4.5 % — ABNORMAL HIGH (ref 0.4–3.1)

## 2018-10-11 LAB — CREATININE, URINE, RANDOM: Creatinine, Urine: 65.17 mg/dL

## 2018-10-11 LAB — MAGNESIUM: Magnesium: 1.9 mg/dL (ref 1.7–2.4)

## 2018-10-11 LAB — PREALBUMIN: Prealbumin: 6 mg/dL — ABNORMAL LOW (ref 18–38)

## 2018-10-11 LAB — PHOSPHORUS: Phosphorus: 4.2 mg/dL (ref 2.5–4.6)

## 2018-10-11 LAB — AMMONIA: Ammonia: 61 umol/L — ABNORMAL HIGH (ref 9–35)

## 2018-10-11 LAB — VITAMIN B12: Vitamin B-12: 484 pg/mL (ref 180–914)

## 2018-10-11 LAB — FOLATE: Folate: 11.2 ng/mL (ref >5.9)

## 2018-10-11 MED ORDER — POLYVINYL ALCOHOL 1.4 % OP SOLN
1.0000 [drp] | OPHTHALMIC | Status: DC | PRN
  Filled 2018-10-11: qty 15

## 2018-10-11 MED ORDER — SODIUM CHLORIDE 0.9% FLUSH: 3.0000 mL | INTRAVENOUS | Status: DC | PRN

## 2018-10-11 MED ORDER — LACTULOSE 10 GM/15ML PO SOLN
20.0000 g | Freq: Four times a day (QID) | ORAL | Status: DC
  Administered 2018-10-11 – 2018-10-12 (×3): 20 g via ORAL
  Filled 2018-10-11 (×3): qty 30

## 2018-10-11 MED ORDER — HYDRALAZINE HCL 20 MG/ML IJ SOLN: 10.0000 mg | INTRAMUSCULAR | Status: DC | PRN

## 2018-10-11 MED ORDER — SIMVASTATIN 20 MG PO TABS
40.0000 mg | ORAL_TABLET | Freq: Every day | ORAL | Status: DC
  Administered 2018-10-11 – 2018-10-13 (×4): 40 mg via ORAL
  Filled 2018-10-11 (×4): qty 2

## 2018-10-11 MED ORDER — MONTELUKAST SODIUM 10 MG PO TABS
10.0000 mg | ORAL_TABLET | Freq: Every day | ORAL | Status: DC
  Administered 2018-10-11 – 2018-10-13 (×4): 10 mg via ORAL
  Filled 2018-10-11 (×4): qty 1

## 2018-10-11 MED ORDER — ALUM & MAG HYDROXIDE-SIMETH 200-200-20 MG/5ML PO SUSP
30.0000 mL | ORAL | Status: DC | PRN
  Administered 2018-10-12: 30 mL via ORAL
  Filled 2018-10-11: qty 30

## 2018-10-11 MED ORDER — LORATADINE 10 MG PO TABS
10.0000 mg | ORAL_TABLET | Freq: Every day | ORAL | Status: DC | PRN
  Filled 2018-10-11: qty 1

## 2018-10-11 MED ORDER — ONDANSETRON HCL 4 MG PO TABS: 4.0000 mg | ORAL_TABLET | Freq: Four times a day (QID) | ORAL | Status: DC | PRN

## 2018-10-11 MED ORDER — SODIUM CHLORIDE 0.9% FLUSH
3.0000 mL | Freq: Two times a day (BID) | INTRAVENOUS | Status: DC
  Administered 2018-10-11 – 2018-10-13 (×6): 3 mL via INTRAVENOUS

## 2018-10-11 MED ORDER — SODIUM CHLORIDE 0.9 % IV SOLN
1.0000 g | INTRAVENOUS | Status: AC
  Administered 2018-10-11 – 2018-10-13 (×3): 1 g via INTRAVENOUS
  Filled 2018-10-11 (×3): qty 10

## 2018-10-11 MED ORDER — GUAIFENESIN-DM 100-10 MG/5ML PO SYRP: 5.0000 mL | ORAL_SOLUTION | ORAL | Status: DC | PRN

## 2018-10-11 MED ORDER — HYDROCORTISONE 1 % EX CREA
1.0000 "application " | TOPICAL_CREAM | Freq: Three times a day (TID) | CUTANEOUS | Status: DC | PRN
  Filled 2018-10-11: qty 28

## 2018-10-11 MED ORDER — SODIUM CHLORIDE 0.9 % IV SOLN
250.0000 mL | INTRAVENOUS | Status: DC | PRN
  Administered 2018-10-11: 250 mL via INTRAVENOUS

## 2018-10-11 MED ORDER — ACETAMINOPHEN 325 MG PO TABS
650.0000 mg | ORAL_TABLET | Freq: Four times a day (QID) | ORAL | Status: DC | PRN
  Administered 2018-10-12: 650 mg via ORAL
  Filled 2018-10-11: qty 2

## 2018-10-11 MED ORDER — NYSTATIN 100000 UNIT/GM EX CREA
1.0000 "application " | TOPICAL_CREAM | Freq: Two times a day (BID) | CUTANEOUS | Status: DC
  Administered 2018-10-11 – 2018-10-14 (×6): 1 via TOPICAL
  Filled 2018-10-11: qty 15

## 2018-10-11 MED ORDER — LIP MEDEX EX OINT
1.0000 "application " | TOPICAL_OINTMENT | CUTANEOUS | Status: DC | PRN
  Filled 2018-10-11: qty 7

## 2018-10-11 MED ORDER — SALINE SPRAY 0.65 % NA SOLN
1.0000 | NASAL | Status: DC | PRN
  Filled 2018-10-11: qty 44

## 2018-10-11 MED ORDER — LORATADINE 10 MG PO TABS
10.0000 mg | ORAL_TABLET | Freq: Every day | ORAL | Status: DC
  Administered 2018-10-11 – 2018-10-14 (×4): 10 mg via ORAL
  Filled 2018-10-11 (×4): qty 1

## 2018-10-11 MED ORDER — CITALOPRAM HYDROBROMIDE 20 MG PO TABS
40.0000 mg | ORAL_TABLET | Freq: Every day | ORAL | Status: DC
  Administered 2018-10-11 – 2018-10-14 (×4): 40 mg via ORAL
  Filled 2018-10-11 (×4): qty 2

## 2018-10-11 MED ORDER — ENSURE ENLIVE PO LIQD
237.0000 mL | Freq: Two times a day (BID) | ORAL | Status: DC
  Administered 2018-10-12 – 2018-10-14 (×4): 237 mL via ORAL

## 2018-10-11 MED ORDER — POLYETHYLENE GLYCOL 3350 17 G PO PACK: 17.0000 g | PACK | Freq: Every day | ORAL | Status: DC | PRN

## 2018-10-11 MED ORDER — MUSCLE RUB 10-15 % EX CREA
1.0000 "application " | TOPICAL_CREAM | CUTANEOUS | Status: DC | PRN
  Filled 2018-10-11: qty 85

## 2018-10-11 MED ORDER — HYDROCODONE-ACETAMINOPHEN 5-325 MG PO TABS: 1.0000 | ORAL_TABLET | Freq: Four times a day (QID) | ORAL | Status: DC | PRN

## 2018-10-11 MED ORDER — INSULIN ASPART 100 UNIT/ML ~~LOC~~ SOLN
0.0000 [IU] | SUBCUTANEOUS | Status: DC
  Administered 2018-10-11: 1 [IU] via SUBCUTANEOUS
  Administered 2018-10-11: 2 [IU] via SUBCUTANEOUS
  Administered 2018-10-12: 1 [IU] via SUBCUTANEOUS
  Administered 2018-10-12: 2 [IU] via SUBCUTANEOUS
  Administered 2018-10-12: 1 [IU] via SUBCUTANEOUS
  Administered 2018-10-12: 2 [IU] via SUBCUTANEOUS
  Administered 2018-10-13: 3 [IU] via SUBCUTANEOUS
  Administered 2018-10-13: 1 [IU] via SUBCUTANEOUS
  Administered 2018-10-13 (×2): 2 [IU] via SUBCUTANEOUS
  Administered 2018-10-13: 3 [IU] via SUBCUTANEOUS
  Administered 2018-10-14 (×2): 1 [IU] via SUBCUTANEOUS

## 2018-10-11 MED ORDER — PHENOL 1.4 % MT LIQD: 1.0000 | OROMUCOSAL | Status: DC | PRN

## 2018-10-11 MED ORDER — HYDROCODONE-ACETAMINOPHEN 5-325 MG PO TABS
1.0000 | ORAL_TABLET | ORAL | Status: DC | PRN
  Administered 2018-10-11: 1 via ORAL
  Administered 2018-10-11: 2 via ORAL
  Administered 2018-10-11: 1 via ORAL
  Administered 2018-10-12: 2 via ORAL
  Administered 2018-10-12: 1 via ORAL
  Administered 2018-10-13: 2 via ORAL
  Administered 2018-10-14: 1 via ORAL
  Administered 2018-10-14: 2 via ORAL
  Filled 2018-10-11: qty 1
  Filled 2018-10-11 (×2): qty 2
  Filled 2018-10-11 (×3): qty 1
  Filled 2018-10-11 (×2): qty 2

## 2018-10-11 MED ORDER — SENNOSIDES-DOCUSATE SODIUM 8.6-50 MG PO TABS: 2.0000 | ORAL_TABLET | Freq: Every evening | ORAL | Status: DC | PRN

## 2018-10-11 MED ORDER — LACTULOSE 10 GM/15ML PO SOLN
20.0000 g | Freq: Three times a day (TID) | ORAL | Status: DC
  Administered 2018-10-11: 20 g via ORAL
  Filled 2018-10-11: qty 30

## 2018-10-11 MED ORDER — HYDROCORTISONE (PERIANAL) 2.5 % EX CREA
1.0000 "application " | TOPICAL_CREAM | Freq: Four times a day (QID) | CUTANEOUS | Status: DC | PRN
  Filled 2018-10-11: qty 28.35

## 2018-10-11 MED ORDER — SODIUM CHLORIDE 0.9 % IV SOLN
125.0000 mg | Freq: Every day | INTRAVENOUS | Status: AC
  Administered 2018-10-11 – 2018-10-13 (×3): 125 mg via INTRAVENOUS
  Filled 2018-10-11 (×4): qty 10

## 2018-10-11 MED ORDER — METHOCARBAMOL 500 MG PO TABS
500.0000 mg | ORAL_TABLET | Freq: Four times a day (QID) | ORAL | Status: DC | PRN
  Administered 2018-10-14: 500 mg via ORAL
  Filled 2018-10-11: qty 1

## 2018-10-11 MED ORDER — ACETAMINOPHEN 650 MG RE SUPP: 650.0000 mg | Freq: Four times a day (QID) | RECTAL | Status: DC | PRN

## 2018-10-11 MED ORDER — ONDANSETRON HCL 4 MG/2ML IJ SOLN: 4.0000 mg | Freq: Four times a day (QID) | INTRAMUSCULAR | Status: DC | PRN

## 2018-10-11 NOTE — Evaluation (Signed)
Physical Therapy Evaluation Patient Details Name: Brittany Russo MRN: [id number] DOB: 01-16-1946 Today's Date: 10/11/2018   History of Present Illness  Pt is a 73 y.o. female admitted 10/10/18 with low hemoglobin and abdominal discomfort; suspect lower GI source. Of note, recent admission 09/22/18 for R TKA with subsequent fall resulting in readmission with R distal femur fx s/p TKA repair; pt then d/c to SNF 09/30/18. PMH includes HTN, DM2, obesity.    Clinical Impression  Pt presents with an overall decrease in functional mobility secondary to above. PTA, pt recently admitted to SNF s/p R TKA and fall with femur fx; before this, pt mod indep living at home. Today, pt required maxA+2 for standing trials with RW; pt limited by generalized weakness and anxiety/fearful of falling. Pt would benefit from continued acute PT services to maximize functional mobility and independence prior to d/c with continued SNF-level therapies.     Follow Up Recommendations SNF;Supervision/Assistance - 24 hour    Equipment Recommendations  None recommended by PT    Recommendations for Other Services       Precautions / Restrictions Precautions Precautions: Fall;Knee Restrictions Other Position/Activity Restrictions: No orders- per chart admission 1 wk ago, pt with RLE WBAT, R knee immobilizer on at all times, no R knee ROM      Mobility  Bed Mobility Overal bed mobility: Needs Assistance Bed Mobility: Supine to Sit;Sit to Supine;Rolling Rolling: Min assist   Supine to sit: Max assist;HOB elevated Sit to supine: Max assist;+2 for physical assistance   General bed mobility comments: MaxA to assist hips to EOB and assist trunk elevation; maxA+2 to return to supine. MinA to roll to L-side, mod indep rolling onto R-side  Transfers Overall transfer level: Needs assistance Equipment used: Rolling walker (2 wheeled) Transfers: Sit to/from Stand Sit to Stand: Max assist;+2 physical assistance          General transfer comment: Pt able to perform 3x sit<>stand total to RW with maxA+2 to rise and steady; pt with posterior lean and BLEs extended in front of base of support, max cues each standing trial to take steps back to midline. Pt with bowel incontinence, dependent for pericare  Ambulation/Gait             General Gait Details: Minimal steps backwards with RW and maxA; L knee buckling  Stairs            Wheelchair Mobility    Modified Rankin (Stroke Patients Only)       Balance Overall balance assessment: Needs assistance Sitting-balance support: No upper extremity supported;Feet supported Sitting balance-Leahy Scale: Fair       Standing balance-Leahy Scale: Poor Standing balance comment: Heavy reliance on BUE support                             Pertinent Vitals/Pain Pain Assessment: Faces Faces Pain Scale: Hurts little more Pain Location: RLE>LLE Pain Descriptors / Indicators: Guarding;Grimacing Pain Intervention(s): Monitored during session    Home Living Family/patient expects to be discharged to:: Skilled nursing facility                 Additional Comments: Recent d/c to [facility] SNF    Prior Function           Comments: Pt was mod indep with intermittent use of RW prior to TKA (09/22/18); since fall and RLE fx, was d/c to SNF for rehab     Hand Dominance  Extremity/Trunk Assessment   Upper Extremity Assessment Upper Extremity Assessment: Overall WFL for tasks assessed    Lower Extremity Assessment Lower Extremity Assessment: RLE deficits/detail;LLE deficits/detail;Generalized weakness RLE Deficits / Details: Functionally <3/5 throughout RLE: Unable to fully assess due to immobilization;Unable to fully assess due to pain LLE Deficits / Details: Hip flex <3/5, knee flex/ext at least 3/5       Communication   Communication: No difficulties  Cognition Arousal/Alertness: Awake/alert Behavior During Therapy:  WFL for tasks assessed/performed;Anxious Overall Cognitive Status: No family/caregiver present to determine baseline cognitive functioning Area of Impairment: Attention;Problem solving;Following commands                   Current Attention Level: Selective   Following Commands: Follows multi-step commands inconsistently     Problem Solving: Requires verbal cues;Difficulty sequencing General Comments: Anxious with mobility and fearful of falling, poor attention due to this requiring frequent redirection to task; responded well to cues and encouragement      General Comments      Exercises     Assessment/Plan    PT Assessment Patient needs continued PT services  PT Problem List Decreased strength;Decreased range of motion;Decreased activity tolerance;Decreased balance;Pain;Obesity;Decreased mobility;Decreased cognition;Decreased knowledge of use of DME       PT Treatment Interventions DME instruction;Gait training;Functional mobility training;Therapeutic activities;Therapeutic exercise;Patient/family education;Balance training;Wheelchair mobility training    PT Goals (Current goals can be found in the Care Plan section)  Acute Rehab PT Goals Patient Stated Goal: be able to walk PT Goal Formulation: With patient Time For Goal Achievement: 10/25/18 Potential to Achieve Goals: Good    Frequency Min 3X/week   Barriers to discharge        Co-evaluation PT/OT/SLP Co-Evaluation/Treatment: Yes Reason for Co-Treatment: For patient/therapist safety;To address functional/ADL transfers PT goals addressed during session: Mobility/safety with mobility;Balance;Proper use of DME         AM-PAC PT "6 Clicks" Mobility  Outcome Measure Help needed turning from your back to your side while in a flat bed without using bedrails?: A Lot Help needed moving from lying on your back to sitting on the side of a flat bed without using bedrails?: A Lot Help needed moving to and from a  bed to a chair (including a wheelchair)?: Total Help needed standing up from a chair using your arms (e.g., wheelchair or bedside chair)?: Total Help needed to walk in hospital room?: Total Help needed climbing 3-5 steps with a railing? : Total 6 Click Score: 8    End of Session Equipment Utilized During Treatment: Gait belt;Right knee immobilizer Activity Tolerance: Patient tolerated treatment well;Patient limited by fatigue Patient left: in bed;with call bell/phone within reach;with bed alarm set Nurse Communication: Mobility status;Need for lift equipment PT Visit Diagnosis: Other abnormalities of gait and mobility (R26.89);Muscle weakness (generalized) (M62.81) Pain - Right/Left: Right Pain - part of body: Knee;Leg    Time: 4098-1191 PT Time Calculation (min) (ACUTE ONLY): 32 min   Charges:   PT Evaluation $PT Eval Moderate Complexity: 1 Mod        Mabeline Caras, PT, DPT Acute Rehabilitation Services  Pager [phone] Office [place] 10/11/2018, 3:47 PM

## 2018-10-11 NOTE — TOC Initial Note (Addendum)
Transition of Care [facility]) - Initial/Assessment Note    Patient Details  Name: Brittany Russo MRN: [id number] Date of Birth: 23-Jan-1946  Transition of Care [facility]) CM/SW Contact:    Alberteen Sam, [place] Phone Number: [phone] 10/11/2018, 1:40 PM  Clinical Narrative:                  CSW consulted with patient for discharge planning. Patient reports she has been at [facility] and would like to return at time of discharge. Patient reports being unsure as to how long she has been staying at [facility]. Patient gave CSW permission to call husband for any updates, CSW will send referral to [facility] to see if patient can return at time of discharge.   Update: Benson [place] reports patient can return when medically ready.   Expected Discharge Plan: [place] Barriers to Discharge: Continued Medical Work up   Patient Goals and CMS Choice Patient states their goals for this hospitalization and ongoing recovery are:: to go back to [facility] SNF CMS Medicare.gov Compare Post Acute Care list provided to:: Patient Choice offered to / list presented to : Patient  Expected Discharge Plan and Services Expected Discharge Plan: [place] Choice: [place] Living arrangements for the past 2 months: Skilled Nursing Facility([facility])                                      Prior Living Arrangements/Services Living arrangements for the past 2 months: Skilled Nursing Facility([facility]) Lives with:: Self Patient language and need for interpreter reviewed:: Yes Do you feel safe going back to the place where you live?: No   patient needs short term rehab before returning home with husband  Need for Family Participation in Patient Care: No (Comment) Care giver support system in place?: Yes (comment)   Criminal Activity/Legal Involvement Pertinent to Current Situation/Hospitalization: No - Comment  as needed  Activities of Daily Living Home Assistive Devices/Equipment: Blood pressure cuff, CBG Meter, Walker (specify type) ADL Screening (condition at time of admission) Patient's cognitive ability adequate to safely complete daily activities?: Yes Is the patient deaf or have difficulty hearing?: No Does the patient have difficulty seeing, even when wearing glasses/contacts?: No Does the patient have difficulty concentrating, remembering, or making decisions?: No Patient able to express need for assistance with ADLs?: Yes Does the patient have difficulty dressing or bathing?: No Independently performs ADLs?: Yes (appropriate for developmental age) Does the patient have difficulty walking or climbing stairs?: Yes Weakness of Legs: Right Weakness of Arms/Hands: None  Permission Sought/Granted Permission sought to share information with : Case Manager, Family Supports, Chartered certified accountant granted to share information with : Yes, Verbal Permission Granted  Share Information with NAME: Brittany Russo  Permission granted to share info w AGENCY: SNFs  Permission granted to share info w Relationship: spouse  Permission granted to share info w Contact Information: [phone]  Emotional Assessment Appearance:: Appears stated age Attitude/Demeanor/Rapport: Gracious Affect (typically observed): Calm Orientation: : Oriented to Self, Oriented to Place, Oriented to  Time, Oriented to Situation Alcohol / Substance Use: Not Applicable Psych Involvement: No (comment)  Admission diagnosis:  Rectal bleeding [K62.5] Hepatic cirrhosis, unspecified hepatic cirrhosis type, unspecified whether ascites present [facility]) [K74.60] Patient Active Problem List   Diagnosis Date Noted  . AKI (acute kidney injury) ([place]) 10/11/2018  .  Lower GI bleed 10/10/2018  . Anasarca 10/10/2018  . Hypoalbuminemia 10/10/2018  . Depression 09/28/2018  . Acute hepatic encephalopathy 09/28/2018  .  Non-alcoholic cirrhosis ([place]) 24/40/1027  . Periprosthetic fracture around internal prosthetic right knee joint 09/24/2018  . S/P right TKA 09/22/2018  . Status post total knee replacement, right 09/22/2018  . DM2 (diabetes mellitus, type 2) ([place]) 07/11/2015  . Hypertension 07/11/2015  . Hyperlipidemia 07/11/2015   PCP:  [facility]:   [facility] [address], [place] [address] Phone: [phone] Fax: [phone]     Social Determinants of Health (SDOH) Interventions    Readmission Risk Interventions No flowsheet data found.

## 2018-10-11 NOTE — ED Notes (Signed)
ED TO INPATIENT HANDOFF REPORT  ED Nurse Name and Phone #: Erma Heritage, [phone]  S Name/Age/Gender Brittany Russo 73 y.o. female Room/Bed: 034C/034C  Code Status   Code Status: Prior  Home/SNF/Other [facility] Patient oriented to: self, place, time and situation Is this baseline? Yes   Triage Complete: Triage complete  Chief Complaint bleed   Triage Note Pt transferred from [facility] with c/o GI bleed that is now controlled; pt is needing GI consult per [employer]; Pt has a R femur fracture and was reported to be hypoglycemic; Hx of cirrhosis of liver and ESRD; 126/74; 86 (SR); 100% on RA; 18    Allergies Allergies  Allergen Reactions  . Asa [Aspirin]     Pt not allergic but cannot take due to bleeding   . Other     Any jewelry metal-hives and itching     Level of Care/Admitting Diagnosis ED Disposition    ED Disposition Condition [facility]: [place] [100100]  Level of Care: Telemetry Medical [104]  Covid Evaluation: Confirmed COVID Negative  Diagnosis: Lower GI bleed [032122]  Admitting Physician: Toy Baker [[id number]]  Attending Physician: Toy Baker [[id number]]  Estimated length of stay: 3 - 4 days  Certification:: I certify this patient will need inpatient services for at least 2 midnights  PT Class (Do Not Modify): Inpatient [101]  PT Acc Code (Do Not Modify): Private [1]       B Medical/Surgery History Past Medical History:  Diagnosis Date  . Allergy   . Anxiety   . Arthritis   . Cirrhosis of liver ([place]) ~ 2004   from NAFLD.  Dx ~ 2004.  Dr Dorcas Mcmurray at [facility]/    . Depression   . Diabetes mellitus without complication ([place])    type 2  . Eczema of both hands   . Endometrial cancer ([place]) 1996, 2003   hysterectomy 1996.  recurrence 2003, treated with radiation.    [name] Headache   . 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 Hyperlipidemia   . Hypertension   . Seizures ([place])    last seizure June 07, 2014   . Thrombocytopenia ([place])   . Thyroid nodule   . Vaginal cancer [facility])    Past Surgical History:  Procedure Laterality Date  . CATARACT EXTRACTION W/PHACO Left 06/04/2017   Procedure: CATARACT EXTRACTION PHACO AND INTRAOCULAR LENS PLACEMENT (IOC);  Surgeon: Baruch Goldmann, MD;  Location: [facility];  Service: Ophthalmology;  Laterality: Left;  CDE: 3.90  . CATARACT EXTRACTION W/PHACO Right 07/15/2017   Procedure: CATARACT EXTRACTION PHACO AND INTRAOCULAR LENS PLACEMENT (IOC);  Surgeon: Baruch Goldmann, MD;  Location: [facility];  Service: Ophthalmology;  Laterality: Right;  CDE: 7.60  . COLONOSCOPY  06/2007   in [place]. for hematochzia.  radiation proctitis  . DILATION AND CURETTAGE OF UTERUS    . ESOPHAGOGASTRODUODENOSCOPY  2016  . TOTAL ABDOMINAL HYSTERECTOMY  1996  . TOTAL KNEE ARTHROPLASTY Right 09/22/2018   Procedure: TOTAL KNEE ARTHROPLASTY;  Surgeon: Paralee Cancel, MD;  Location: [facility];  Service: Orthopedics;  Laterality: Right;  70 mins  . TOTAL KNEE REVISION Right 09/27/2018   Procedure: TOTAL KNEE REVISION;  Surgeon: Paralee Cancel, MD;  Location: [facility];  Service: Orthopedics;  Laterality: Right;     A IV Location/Drains/Wounds Patient Lines/Drains/Airways Status   Active Line/Drains/Airways    Name:  Placement date:   Placement time:   Site:   Days:   Peripheral IV 10/10/18 Right Antecubital   10/10/18    2108    Antecubital   1   Peripheral IV 10/10/18 Left Antecubital   10/10/18    2108    Antecubital   1   External Urinary Catheter   09/30/18    0950    -   11   Incision (Closed) 09/27/18 Knee   09/27/18    1726     14          Intake/Output Last 24 hours  Intake/Output Summary (Last 24 hours) at 10/11/2018 0128 Last data filed at 10/11/2018 0036 Gross per 24 hour  Intake 100 ml  Output -  Net 100 ml    Labs/Imaging Results for orders placed or performed during the  hospital encounter of 10/10/18 (from the past 48 hour(s))  Type and screen [place]     Status: None   Collection Time: 10/10/18  9:10 PM  Result Value Ref Range   ABO/RH(D) A POS    Antibody Screen NEG    Sample Expiration      10/13/2018,2359 Performed at [facility], [place] [address] [place], [place] [address]   ABO/Rh     Status: None (Preliminary result)   Collection Time: 10/10/18  9:10 PM  Result Value Ref Range   ABO/RH(D)      A POS Performed at [place] [address] [place], [place] [address]   Comprehensive metabolic panel     Status: Abnormal   Collection Time: 10/10/18  9:27 PM  Result Value Ref Range   Sodium 132 (L) 135 - 145 mmol/L   Potassium 4.5 3.5 - 5.1 mmol/L   Chloride 105 98 - 111 mmol/L   CO2 22 22 - 32 mmol/L   Glucose, Bld 104 (H) 70 - 99 mg/dL   BUN 24 (H) 8 - 23 mg/dL   Creatinine, Ser 1.51 (H) 0.44 - 1.00 mg/dL   Calcium 7.9 (L) 8.9 - 10.3 mg/dL   Total Protein 5.0 (L) 6.5 - 8.1 g/dL   Albumin 2.0 (L) 3.5 - 5.0 g/dL   AST 60 (H) 15 - 41 U/L   ALT 42 0 - 44 U/L   Alkaline Phosphatase 141 (H) 38 - 126 U/L   Total Bilirubin 1.4 (H) 0.3 - 1.2 mg/dL   GFR calc non Af Amer 35 (L) >60 mL/min   GFR calc Af Amer 40 (L) >60 mL/min   Anion gap 5 5 - 15    Comment: Performed at [facility], [place] [address] [place], [place] [address]  CBC with Differential     Status: Abnormal   Collection Time: 10/10/18  9:27 PM  Result Value Ref Range   WBC 3.6 (L) 4.0 - 10.5 K/uL   RBC 2.53 (L) 3.87 - 5.11 MIL/uL   Hemoglobin 7.7 (L) 12.0 - 15.0 g/dL   HCT 25.2 (L) 36.0 - 46.0 %   MCV 99.6 80.0 - 100.0 fL   MCH 30.4 26.0 - 34.0 pg   MCHC 30.6 30.0 - 36.0 g/dL   RDW 18.6 (H) 11.5 - 15.5 %   Platelets 87 (L) 150 - 400 K/uL    Comment: REPEATED TO VERIFY PLATELET COUNT CONFIRMED BY SMEAR SPECIMEN CHECKED FOR CLOTS Immature Platelet Fraction may be clinically indicated, consider ordering this additional  test [id number]    nRBC 0.0 0.0 -  0.2 %   Neutrophils Relative % 60 %   Lymphocytes Relative 20 %   Monocytes Relative 15 %   Eosinophils Relative 5 %   Basophils Relative 0 %   Band Neutrophils 0 %   Metamyelocytes Relative 0 %   Myelocytes 0 %   Promyelocytes Relative 0 %   Blasts 0 %   nRBC 0 0 /100 WBC   Neutro Abs 2.2 1.7 - 7.7 K/uL   Lymphs Abs 0.7 0.7 - 4.0 K/uL   Monocytes Absolute 0.5 0.1 - 1.0 K/uL   Eosinophils Absolute 0.2 0.0 - 0.5 K/uL   Basophils Absolute 0.0 0.0 - 0.1 K/uL    Comment: Performed at [place] [address], [place], [place] [address]  Protime-INR     Status: Abnormal   Collection Time: 10/10/18  9:27 PM  Result Value Ref Range   Prothrombin Time 17.9 (H) 11.4 - 15.2 seconds   INR 1.5 (H) 0.8 - 1.2    Comment: (NOTE) INR goal varies based on device and disease states. Performed at [facility], [place] [address] [place], [place] [address]   Urinalysis, Routine w reflex microscopic     Status: Abnormal   Collection Time: 10/10/18  9:43 PM  Result Value Ref Range   Color, Urine YELLOW YELLOW   APPearance HAZY (A) CLEAR   Specific Gravity, Urine 1.010 1.005 - 1.030   pH 5.0 5.0 - 8.0   Glucose, UA NEGATIVE NEGATIVE mg/dL   Hgb urine dipstick SMALL (A) NEGATIVE   Bilirubin Urine NEGATIVE NEGATIVE   Ketones, ur NEGATIVE NEGATIVE mg/dL   Protein, ur NEGATIVE NEGATIVE mg/dL   Nitrite NEGATIVE NEGATIVE   Leukocytes,Ua MODERATE (A) NEGATIVE   RBC / HPF 6-10 0 - 5 RBC/hpf   WBC, UA >50 (H) 0 - 5 WBC/hpf   Bacteria, UA RARE (A) NONE SEEN   Squamous Epithelial / LPF 0-5 0 - 5   Mucus PRESENT    Hyaline Casts, UA PRESENT     Comment: Performed at [facility], [place] [address], [place], [place] [address]  Urine rapid drug screen (hosp performed)     Status: Abnormal   Collection Time: 10/10/18  9:43 PM  Result Value Ref Range   Opiates POSITIVE (A) NONE DETECTED   Cocaine NONE DETECTED NONE DETECTED   Benzodiazepines POSITIVE  (A) NONE DETECTED   Amphetamines NONE DETECTED NONE DETECTED   Tetrahydrocannabinol NONE DETECTED NONE DETECTED   Barbiturates NONE DETECTED NONE DETECTED    Comment: (NOTE) DRUG SCREEN FOR MEDICAL PURPOSES ONLY.  IF CONFIRMATION IS NEEDED FOR ANY PURPOSE, NOTIFY LAB WITHIN 5 DAYS. LOWEST DETECTABLE LIMITS FOR URINE DRUG SCREEN Drug Class                     Cutoff (ng/mL) Amphetamine and metabolites    1000 Barbiturate and metabolites    200 Benzodiazepine                 326 Tricyclics and metabolites     300 Opiates and metabolites        300 Cocaine and metabolites        300 THC                            50 Performed at [facility], [place] [address] [place], [place] [address]   CBG monitoring, ED  Status: None   Collection Time: 10/10/18  9:50 PM  Result Value Ref Range   Glucose-Capillary 86 70 - 99 mg/dL  SARS Coronavirus 2 (CEPHEID - Performed in [facility] hospital lab), Hosp Order     Status: None   Collection Time: 10/10/18 10:02 PM   Specimen: Nasopharyngeal Swab  Result Value Ref Range   SARS Coronavirus 2 NEGATIVE NEGATIVE    Comment: (NOTE) If result is NEGATIVE SARS-CoV-2 target nucleic acids are NOT DETECTED. The SARS-CoV-2 RNA is generally detectable in upper and lower  respiratory specimens during the acute phase of infection. The lowest  concentration of SARS-CoV-2 viral copies this assay can detect is 250  copies / mL. A negative result does not preclude SARS-CoV-2 infection  and should not be used as the sole basis for treatment or other  patient management decisions.  A negative result may occur with  improper specimen collection / handling, submission of specimen other  than nasopharyngeal swab, presence of viral mutation(s) within the  areas targeted by this assay, and inadequate number of viral copies  (<250 copies / mL). A negative result must be combined with clinical  observations, patient history, and epidemiological  information. If result is POSITIVE SARS-CoV-2 target nucleic acids are DETECTED. The SARS-CoV-2 RNA is generally detectable in upper and lower  respiratory specimens dur ing the acute phase of infection.  Positive  results are indicative of active infection with SARS-CoV-2.  Clinical  correlation with patient history and other diagnostic information is  necessary to determine patient infection status.  Positive results do  not rule out bacterial infection or co-infection with other viruses. If result is PRESUMPTIVE POSTIVE SARS-CoV-2 nucleic acids MAY BE PRESENT.   A presumptive positive result was obtained on the submitted specimen  and confirmed on repeat testing.  While 2019 novel coronavirus  (SARS-CoV-2) nucleic acids may be present in the submitted sample  additional confirmatory testing may be necessary for epidemiological  and / or clinical management purposes  to differentiate between  SARS-CoV-2 and other Sarbecovirus currently known to infect humans.  If clinically indicated additional testing with an alternate test  methodology [phone]) is advised. The SARS-CoV-2 RNA is generally  detectable in upper and lower respiratory sp ecimens during the acute  phase of infection. The expected result is Negative. Fact Sheet for Patients:  StrictlyIdeas.no Fact Sheet for Healthcare Providers: BankingDealers.co.za This test is not yet approved or cleared by the [place] FDA and has been authorized for detection and/or diagnosis of SARS-CoV-2 by FDA under an Emergency Use Authorization (EUA).  This EUA will remain in effect (meaning this test can be used) for the duration of the COVID-19 declaration under Section 564(b)(1) of the Act, 21 U.S.C. section 360bbb-3(b)(1), unless the authorization is terminated or revoked sooner. Performed at [facility], [place] [address], [place], [place] [address]   CBG monitoring, ED      Status: Abnormal   Collection Time: 10/10/18 10:34 PM  Result Value Ref Range   Glucose-Capillary 130 (H) 70 - 99 mg/dL   Comment 1 Notify RN    Comment 2 Document in Chart    No results found.  Pending Labs Unresulted Labs (From admission, onward)    Start     Ordered   10/11/18 1000  CBC  Once,   STAT     10/11/18 0004   10/11/18 0500  Prealbumin  Tomorrow morning,   R     10/10/18 2359   10/11/18 0003  Osmolality, urine  Once,   STAT     10/11/18 0002   10/11/18 0003  Osmolality  Add-on,   AD     10/11/18 0002   10/10/18 2359  Creatinine, urine, random  Once,   STAT     10/10/18 2358   10/10/18 2359  Sodium, urine, random  Once,   STAT     10/10/18 2358   10/10/18 2330  Vitamin B12  (Anemia Panel (PNL))  Once,   STAT     10/10/18 2329   10/10/18 2330  Folate  (Anemia Panel (PNL))  Once,   STAT     10/10/18 2329   10/10/18 2330  Iron and TIBC  (Anemia Panel (PNL))  Once,   STAT     10/10/18 2329   10/10/18 2330  Ferritin  (Anemia Panel (PNL))  Once,   STAT     10/10/18 2329   10/10/18 2330  Reticulocytes  (Anemia Panel (PNL))  Once,   STAT     10/10/18 2329   10/10/18 2310  Ammonia  Once,   STAT     10/10/18 2309   Signed and Held  Magnesium  Tomorrow morning,   R    Comments: Call MD if <1.5    Signed and Held   Signed and Held  Phosphorus  Tomorrow morning,   R     Signed and Held   Signed and Held  TSH  Once,   R    Comments: Cancel if already done within 1 month and notify MD    Signed and Held   Signed and Held  Comprehensive metabolic panel  Once,   R    Comments: Cal MD for K<3.5 or >5.0    Signed and Held   Signed and Held  CBC  Once,   R    Comments: Call for hg <8.0    Signed and Held          Vitals/Pain Today's Vitals   10/11/18 0000 10/11/18 0015 10/11/18 0029 10/11/18 0030  BP: 122/61 (!) 122/58  (!) 121/55  Pulse: 86 83  83  Resp: 18 13  18   Temp:      TempSrc:      SpO2: 92% 95%  98%  Weight:      Height:      PainSc:   10-Worst  pain ever     Isolation Precautions No active isolations  Medications Medications  albumin human 25 % solution 12.5 g (has no administration in time range)  HYDROcodone-acetaminophen (NORCO/VICODIN) 5-325 MG per tablet 1-2 tablet (2 tablets Oral Given 10/11/18 0037)  cefTRIAXone (ROCEPHIN) 1 g in sodium chloride 0.9 % 100 mL IVPB (0 g Intravenous Stopped 10/11/18 0036)  pantoprazole (PROTONIX) injection 40 mg (40 mg Intravenous Given 10/10/18 2313)    Mobility walks with device High fall risk   Focused Assessments GI   R Recommendations: See Admitting Provider Note  Report given to:   Additional Notes:

## 2018-10-11 NOTE — Consult Note (Addendum)
s                                                                                                                                                                         [facility] Consult: 11:28 AM 10/11/2018  LOS: 1 day    Referring Provider: Dr Reesa Chew  Primary Care Physician:  Rosalee Kaufman, PA-C Primary Gastroenterologist:  Dr. Dorcas Mcmurray [facility].  Dr. Britta Mccreedy in [place] performed the latest EGD and colonoscopy.    Reason for Consultation:  Hematochezia.     HPI: Brittany Russo is a 73 y.o. female.  Hx NAFLD leading to cirrhosis.  Radiation proctitis.  Thrombocytopenia.  Endometrial cancer initially treated with hysterectomy, treated with radiation for recurrence.  Vaginal cancer.  DM 2.    Colonoscopy 2009 to address hematochezia.  Findings of radiation proctitis.  Patient says Dr. Britta Mccreedy did colonoscopy ~2014 and removed what sounds like an adenomatous polyp.   Dr. Britta Mccreedy performed EGD ~ 2016 with unknown findings.   AFP tumor marker 6.4, autoimmune markers, hepatitis ABC serologies on 05/2017.  Patient underwent scheduled right TKR on 7/2 and discharged the following day.  Unfortunately she fell at home and sustained severely comminuted distal femur fracture, dislocation of the femoral component of the knee replacement.  She underwent surgical revision 7/7.  This admission was complicated by hepatic encephalopathy, ammonia level 66.  Improved with lactulose.  Head CT was unremarkable.  AKI.  Patient discharged to SNF. Discharge meds included ASA 81 mg, ferrous sulfate 325 BID for 2 weeks, Celebrex 200 BID, lactulose 30 mL's BID for 2 weeks.  Note says she is taking Xarelto though it is not on the list of discharge meds.  She was transferred to [facility] from an outside ED with no paper record so we do not have a list of her meds other than recent hospital discharge list.  Sent to the ED at [facility] yesterday for evaluation of hematochezia.  Today at SNF  they noted bleeding, not clear if this was associated with a bowel movement.  Patient has long history of intermittent rectal bleeding that can happen a few times a week up to a few times a day.  It occurs with and without BMs.  There is no associated abdominal or rectal pain.  No associated nausea or vomiting.  About 2 weeks ago lactulose was discontinued due to excessive stools.  As a result she is not having daily bowel movements.  There is been no further BM or bleeding since yesterday at rehab. Appetite has been poor since recent surgeries and discharged to rehab.  She gets nausea associated with low blood sugars, no emesis.  No dysphagia.  No heartburn.  Several  weeks of lower extremity swelling predate her knee surgery At the ED they could not tell whether this was vaginal or rectal bleeding.  Hgb 8, ranged between 8 and 11 during recent admission.  GI there felt she would be better served at [facility] so they transferred her down here.  Hgb today 7.8.  MCV 97. AKI with proportional rise of BUN and creatinine. PT/INR 17.9/1.5 There are no orders for blood transfusion.  Lives with her husband in [place].  She does not currently nor did she in the past drink alcohol.    Past Medical History:  Diagnosis Date  . Allergy   . Anxiety   . Arthritis   . Cirrhosis of liver ([place]) ~ 2004   from NAFLD.  Dx ~ 2004.  Dr Dorcas Mcmurray at [facility]/    . Depression   . Diabetes mellitus without complication ([place])    type 2  . Eczema of both hands   . Endometrial cancer ([place]) 1996, 2003   hysterectomy 1996.  recurrence 2003, treated with radiation.    [name] Headache   . 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 in [place]  . Hyperlipidemia   . Hypertension   . Seizures ([place])    last seizure June 07, 2014   . Thrombocytopenia ([place])   . Thyroid nodule     Past Surgical History:  Procedure Laterality Date  . CATARACT EXTRACTION W/PHACO Left  06/04/2017   Procedure: CATARACT EXTRACTION PHACO AND INTRAOCULAR LENS PLACEMENT (IOC);  Surgeon: Baruch Goldmann, MD;  Location: [facility];  Service: Ophthalmology;  Laterality: Left;  CDE: 3.90  . CATARACT EXTRACTION W/PHACO Right 07/15/2017   Procedure: CATARACT EXTRACTION PHACO AND INTRAOCULAR LENS PLACEMENT (IOC);  Surgeon: Baruch Goldmann, MD;  Location: [facility];  Service: Ophthalmology;  Laterality: Right;  CDE: 7.60  . COLONOSCOPY  06/2007   in [place]. for hematochzia.  radiation proctitis  . DILATION AND CURETTAGE OF UTERUS    . ESOPHAGOGASTRODUODENOSCOPY  2016  . TOTAL ABDOMINAL HYSTERECTOMY  1996  . TOTAL KNEE ARTHROPLASTY Right 09/22/2018   Procedure: TOTAL KNEE ARTHROPLASTY;  Surgeon: Paralee Cancel, MD;  Location: [facility];  Service: Orthopedics;  Laterality: Right;  70 mins  . TOTAL KNEE REVISION Right 09/27/2018   Procedure: TOTAL KNEE REVISION;  Surgeon: Paralee Cancel, MD;  Location: [facility];  Service: Orthopedics;  Laterality: Right;    Prior to Admission medications   Medication Sig Start Date End Date Taking? Authorizing Provider  aspirin 81 MG chewable tablet Chew 1 tablet (81 mg total) by mouth 2 (two) times a day for 28 days. 09/30/18 10/28/18  Maurice March, PA-C  atenolol (TENORMIN) 50 MG tablet Take 50 mg by mouth daily.    [provider]  candesartan (ATACAND) 16 MG tablet Take 1 tablet (16 mg total) by mouth daily for 14 days. 09/30/18 10/14/18  Maurice March, PA-C  celecoxib (CELEBREX) 200 MG capsule Take 1 capsule (200 mg total) by mouth 2 (two) times daily. 09/30/18   Maurice March, PA-C  cephALEXin (KEFLEX) 500 MG capsule Take 1 capsule (500 mg total) by mouth 3 (three) times daily for 28 days. 09/30/18 10/28/18  Maurice March, PA-C  citalopram (CELEXA) 40 MG tablet Take 40 mg by mouth daily.    [provider]  diltiazem (CARTIA XT) 180 MG 24 hr capsule Take 180 mg by mouth daily.    [provider]  ferrous sulfate 325 (  65 FE) MG tablet Take  1 tablet (325 mg total) by mouth 2 (two) times daily with a meal for 14 days. 09/23/18 10/07/18  Maurice March, PA-C  fluticasone (FLONASE) 50 MCG/ACT nasal spray Place 1 spray into both nostrils daily.    [provider]  glipiZIDE (GLUCOTROL) 5 MG tablet Take 5 mg by mouth 2 (two) times daily.    [provider]  HYDROcodone-acetaminophen (NORCO/VICODIN) 5-325 MG tablet Take 1 tablet by mouth every 6 (six) hours as needed for moderate pain or severe pain. 09/30/18   Maurice March, PA-C  Insulin Glargine (BASAGLAR KWIKPEN) 100 UNIT/ML SOPN Inject 65 Units into the skin daily.     [provider]  lactulose (CHRONULAC) 10 GM/15ML solution Take 30 mLs (20 g total) by mouth 2 (two) times daily for 14 days. 09/30/18 10/14/18  Maurice March, PA-C  loratadine (CLARITIN) 10 MG tablet Take 10 mg by mouth daily.    [provider]  methocarbamol (ROBAXIN) 500 MG tablet Take 1 tablet (500 mg total) by mouth every 6 (six) hours as needed for muscle spasms. 09/30/18   Maurice March, PA-C  montelukast (SINGULAIR) 10 MG tablet Take 10 mg by mouth at bedtime.    [provider]  mupirocin nasal ointment (BACTROBAN) 2 % Place 1 application into the nose 2 (two) times daily. Use one-half of tube in each nostril twice daily for five (5) days. After application, press sides of nose together and gently massage.    [provider]  ondansetron (ZOFRAN) 4 MG tablet Take 1 tablet (4 mg total) by mouth every 6 (six) hours as needed for nausea. 09/30/18   Maurice March, PA-C  potassium chloride (K-DUR) 10 MEQ tablet Take 10 mEq by mouth daily.    [provider]  simvastatin (ZOCOR) 40 MG tablet Take 40 mg by mouth at bedtime.    [provider]  sitaGLIPtin-metformin (JANUMET) 50-500 MG per tablet Take 1 tablet by mouth 2 (two) times daily with a meal.    [provider]  Ibuprofen-Diphenhydramine Cit (ADVIL PM PO) Take 60 mg by  mouth at bedtime.  05/21/17  [provider]    Scheduled Meds: . citalopram  40 mg Oral Daily  . insulin aspart  0-9 Units Subcutaneous Q4H  . lactulose  20 g Oral TID  . loratadine  10 mg Oral Daily  . montelukast  10 mg Oral QHS  . simvastatin  40 mg Oral QHS  . sodium chloride flush  3 mL Intravenous Q12H   Infusions: . sodium chloride Stopped (10/11/18 0444)   PRN Meds: sodium chloride, acetaminophen **OR** acetaminophen, alum & mag hydroxide-simeth, guaiFENesin-dextromethorphan, hydrALAZINE, HYDROcodone-acetaminophen, hydrocortisone, hydrocortisone cream, lip balm, loratadine, methocarbamol, Muscle Rub, ondansetron **OR** ondansetron (ZOFRAN) IV, phenol, polyethylene glycol, polyvinyl alcohol, senna-docusate, sodium chloride, sodium chloride flush   Allergies as of 10/10/2018 - Review Complete 10/10/2018  Allergen Reaction Noted  . Asa [aspirin]  09/22/2018  . Other  03/06/2013    Family History  Problem Relation Age of Onset  . Heart disease Mother   . Diabetes Mother   . Heart disease Father   . Cancer Father   . Cancer Maternal Grandmother   . Heart disease Maternal Grandfather     Social History   Socioeconomic History  . Marital status: Married    Spouse name: Not on file  . Number of children: Not on file  . Years of education: Not on file  .  Highest education level: Not on file  Occupational History  . Not on file  Social Needs  . Financial resource strain: Not on file  . Food insecurity    Worry: Not on file    Inability: Not on file  . Transportation needs    Medical: Not on file    Non-medical: Not on file  Tobacco Use  . Smoking status: Never Smoker  . Smokeless tobacco: Never Used  Substance and Sexual Activity  . Alcohol use: No  . Drug use: No  . Sexual activity: Never  Lifestyle  . Physical activity    Days per week: Not on file    Minutes per session: Not on file  . Stress: Not on file  Relationships  . Social Product manager on phone: Not on file    Gets together: Not on file    Attends religious service: Not on file    Active member of club or organization: Not on file    Attends meetings of clubs or organizations: Not on file    Relationship status: Not on file  . Intimate partner violence    Fear of current or ex partner: Not on file    Emotionally abused: Not on file    Physically abused: Not on file    Forced sexual activity: Not on file  Other Topics Concern  . Not on file  Social History Narrative  . Not on file    REVIEW OF SYSTEMS: Constitutional: Weakness, fatigue.  The farthest she is progressed with rehab is brief standing with assistance.  No ambulation. ENT:  No nose bleeds Pulm: No shortness of breath.  No cough. CV:  No palpitations, no chest pain.  Several weeks of LE edema.  GU:  No hematuria, no frequency GI: See HPI Heme: Denies excessive or unusual bleeding or bruising other than bruising at phlebotomy and IV sites. Transfusions: None Neuro:  No headaches.  Her blood sugars drop, besides having nausea she develops numbness in her tongue. Derm:  No itching, no rash or sores.  Endocrine:  No sweats or chills.  No polyuria or dysuria Immunization: Reviewed. Travel:  None beyond local counties in last few months.    PHYSICAL EXAM: Vital signs in last 24 hours: Vitals:   10/11/18 0229 10/11/18 0745  BP: (!) 139/51 (!) 127/53  Pulse: 79 74  Resp: 15 18  Temp: 98.2 F (36.8 C) 97.7 F (36.5 C)  SpO2: 99% 100%   Wt Readings from Last 3 Encounters:  10/10/18 95.1 kg  09/27/18 95.1 kg  09/22/18 87.7 kg    General: Obese, tired, looks mildly ill. Head: No facial asymmetry or swelling.  No signs of head trauma. Eyes: No scleral icterus.  No conjunctival pallor.  EOMI. Ears: Not hard of hearing Nose: No discharge or congestion Mouth: Somewhat dry oral mucosa which is otherwise clear and pink.  Fair dentition.  Tongue midline. Neck: No JVD, no thyromegaly, no  masses. Lungs: No labored breathing or cough.  Lungs clear bilaterally with reduced breath sounds. Heart: RRR.  No MRG.  S1, S2 present Abdomen: obese, nontender.  Somewhat distended, moderately tense.  Bowel sounds hypoactive..   Rectal: Deferred Musc/Skeltl: Right knee immobilizer present from top of thigh down to beneath the knee, not removed for exam.  Do not see bruising on the limited exam of the remainder of her right leg.  2+ pitting edema in the feet ankles bilaterally Extremities: Pitting edema bilateral feet/ankles. Neurologic:  Alert.  Oriented x3.  A bit slow with her answers but precise.  Slight asterixis Skin: No rashes, no sores, no significant purpura Tattoos: None Nodes: No cervical adenopathy Psych: Flat affect, calm.  Intake/Output from previous day: 07/20 0701 - 07/21 0700 In: 315.2 [P.O.:200; I.V.:0.2; IV Piggyback:114.9] Out: 750 [Urine:750] Intake/Output this shift: No intake/output data recorded.  LAB RESULTS: Recent Labs    10/10/18 2127 10/11/18 0256 10/11/18 0913  WBC 3.6* 3.6* 3.2*  HGB 7.7* 7.8* 7.7*  HCT 25.2* 25.2* 24.5*  PLT 87* 80* 82*   BMET Lab Results  Component Value Date   NA 132 (L) 10/11/2018   NA 132 (L) 10/10/2018   NA 136 09/29/2018   K 5.1 10/11/2018   K 4.5 10/10/2018   K 3.9 09/29/2018   CL 105 10/11/2018   CL 105 10/10/2018   CL 104 09/29/2018   CO2 19 (L) 10/11/2018   CO2 22 10/10/2018   CO2 21 (L) 09/29/2018   GLUCOSE 123 (H) 10/11/2018   GLUCOSE 104 (H) 10/10/2018   GLUCOSE 205 (H) 09/29/2018   BUN 23 10/11/2018   BUN 24 (H) 10/10/2018   BUN 27 (H) 09/29/2018   CREATININE 1.47 (H) 10/11/2018   CREATININE 1.51 (H) 10/10/2018   CREATININE 0.79 09/29/2018   CALCIUM 7.8 (L) 10/11/2018   CALCIUM 7.9 (L) 10/10/2018   CALCIUM 8.8 (L) 09/29/2018   LFT Recent Labs    10/10/18 2127 10/11/18 0256  PROT 5.0* 4.9*  ALBUMIN 2.0* 2.0*  AST 60* 74*  ALT 42 41  ALKPHOS 141* 141*  BILITOT 1.4* 1.0   PT/INR Lab  Results  Component Value Date   INR 1.5 (H) 10/10/2018   Hepatitis Panel No results for input(s): HEPBSAG, HCVAB, HEPAIGM, HEPBIGM in the last 72 hours. C-Diff No components found for: CDIFF Lipase  No results found for: LIPASE  Drugs of Abuse     Component Value Date/Time   LABOPIA POSITIVE (A) 10/10/2018 2143   COCAINSCRNUR NONE DETECTED 10/10/2018 2143   LABBENZ POSITIVE (A) 10/10/2018 2143   AMPHETMU NONE DETECTED 10/10/2018 2143   THCU NONE DETECTED 10/10/2018 2143   LABBARB NONE DETECTED 10/10/2018 2143     RADIOLOGY STUDIES: No results found.    IMPRESSION:   *   Normocytic anemia. Low iron and low iron saturation with normal ferritin, folate and B12 levels.  Pattern consistent with anemia of chronic disease.  *     Chronic hematochezia.  In setting of ?Eliquis? (for DVT prophylaxis)following orthopedic surgery.  Last dose Eliquis  History of radiation proctitis in 2004.  *    Eliquis on board?  As per HPI, this med is not listed on both of her recent discharge med lists.  Do not have a list of her meds from the nursing home.  However H&P mentions that she is taking Eliquis.  *     Cirrhosis of the liver due to [facility].  No known history of varices or portal gastropathy but we do not have the report of the most recent EGD from 2016.  *    Recent confusion, altered mental status attributed to hepatic encephalopathy, ammonia 66 at the time.  Mental status improved on lactulose.   Lactulose was discontinued by the SNF 2 weeks ago due to excessive bowel movements.  Currently verbally slow, not confused but does have mild asterixis  *    Recent total knee replacement complicated by subsequent fall and fracture of her femur.  *  DM 2  *    AKI.     PLAN:     *   Titrate lactulose to achieve 2-3 bowel movements a day.  *     Ideally should undergo colonoscopy but with her recent knee replacement and subsequent femur fracture and revision of the knee replacement,  not sure she is going to be able to prep for a full colonoscopy.  Flexible sigmoidoscopy may be a better alternative. Given her cirrhosis, EGD also indicated for variceal surveillance.   For today will allow the patient to eat, if we pursue any GI studies it would be tomorrow  *    Consider Feraheme infusion.    *   Ultrasound of abdomen to rule out ascites. After that can start po.     Azucena Freed  10/11/2018, 11:28 AM Phone [phone]   Attending physician's note   I have taken a history, examined the patient and reviewed the chart. I agree with the Advanced Practitioner's note, impression and recommendations.  73 year old female with history of [name] cirrhosis, endometrial cancer status post hysterectomy and radiation. History of radiation proctitis Bright red blood per rectum likely etiology is radiation proctitis Continue supportive care Monitor hemoglobin and transfuse as needed  Anusol suppository daily at bedtime X7 days  If continues to have persistent rectal bleeding will consider flexible sigmoidoscopy for therapeutic intervention  K. Denzil Magnuson , MD [phone]

## 2018-10-11 NOTE — Progress Notes (Signed)
Occupational Therapy Evaluation Patient Details Name: Brittany Russo MRN: [id number] DOB: Jun 02, 1945 Today's Date: 10/11/2018    History of Present Illness Pt is a 73 y.o. female admitted 10/10/18 with low hemoglobin and abdominal discomfort; suspect lower GI source. Of note, recent admission 09/22/18 for R TKA with subsequent fall resulting in readmission with R distal femur fx s/p TKA repair; pt then d/c to SNF 09/30/18. PMH includes HTN, DM2, obesity.   Clinical Impression   Pt presents with above diagnosis. PTA pt PLOF Mod I with ADLs and required rehab after surgery on 09/22/18. Pt currently requires assists in toileting, hygiene, dressing, and functional transfers due to decreased strength, pain, and limited function. Pt will benefit from continued acute therapy to address deficits and to improve strength and ADL engagement prior to dc to SNF. SNF recommended to increase rehab for engagment in ADLs for safe transition to home setting.     Follow Up Recommendations  SNF;Supervision/Assistance - 24 hour    Equipment Recommendations  3 in 1 bedside commode    Recommendations for Other Services       Precautions / Restrictions Precautions Precautions: Fall;Knee Precaution Comments: No ROM to R knee Required Braces or Orthoses: Knee Immobilizer - Right Knee Immobilizer - Right: On at all times Restrictions Weight Bearing Restrictions: No RLE Weight Bearing: Weight bearing as tolerated Other Position/Activity Restrictions: No orders- per chart admission 1 wk ago, pt with RLE WBAT, R knee immobilizer on at all times, no R knee ROM      Mobility Bed Mobility Overal bed mobility: Needs Assistance Bed Mobility: Supine to Sit;Sit to Supine;Rolling Rolling: Min assist   Supine to sit: Max assist;HOB elevated Sit to supine: Max assist;+2 for physical assistance   General bed mobility comments: MaxA to assist hips to EOB and assist trunk elevation; maxA+2 to return to supine. MinA to roll  to L-side, mod indep rolling onto R-side  Transfers Overall transfer level: Needs assistance Equipment used: Rolling walker (2 wheeled) Transfers: Sit to/from Stand Sit to Stand: Max assist;+2 physical assistance         General transfer comment: Pt able to perform 3x sit<>stand total to RW with maxA+2 to rise and steady; pt with posterior lean and BLEs extended in front of base of support, max cues each standing trial to take steps back to midline. Pt with bowel incontinence, dependent for pericare    Balance Overall balance assessment: Needs assistance Sitting-balance support: No upper extremity supported;Feet supported Sitting balance-Leahy Scale: Fair   Postural control: Posterior lean Standing balance support: Bilateral upper extremity supported Standing balance-Leahy Scale: Poor Standing balance comment: Heavy reliance on BUE support                           ADL either performed or assessed with clinical judgement   ADL Overall ADL's : Needs assistance/impaired Eating/Feeding: Set up;Bed level   Grooming: Set up;Bed level   Upper Body Bathing: Set up;Bed level   Lower Body Bathing: Maximal assistance   Upper Body Dressing : Set up;Bed level   Lower Body Dressing: Maximal assistance   Toilet Transfer: Maximal assistance;+2 for physical assistance;+2 for safety/equipment;Stand-pivot;Cueing for safety;Cueing for sequencing Toilet Transfer Details (indicate cue type and reason): attempted transfer from bed to [facility]. Unsuccessful due to decreased strength and feaful of falling.           General ADL Comments: Pt demonstrates decreased standing tolerance for functional tasks due to weakness  and pain. Lift equipment requiredf at this time.     Vision         Perception     Praxis      Pertinent Vitals/Pain Pain Assessment: Faces Faces Pain Scale: Hurts little more Pain Location: RLE>LLE Pain Descriptors / Indicators: Guarding;Grimacing Pain  Intervention(s): Limited activity within patient's tolerance;Monitored during session;Repositioned     Hand Dominance     Extremity/Trunk Assessment Upper Extremity Assessment Upper Extremity Assessment: Generalized weakness   Lower Extremity Assessment Lower Extremity Assessment: Defer to PT evaluation RLE Deficits / Details: Functionally <3/5 throughout RLE: Unable to fully assess due to immobilization;Unable to fully assess due to pain LLE Deficits / Details: Hip flex <3/5, knee flex/ext at least 3/5   Cervical / Trunk Assessment Cervical / Trunk Assessment: Normal   Communication Communication Communication: No difficulties   Cognition Arousal/Alertness: Awake/alert Behavior During Therapy: WFL for tasks assessed/performed;Anxious Overall Cognitive Status: No family/caregiver present to determine baseline cognitive functioning Area of Impairment: Attention;Problem solving;Following commands                   Current Attention Level: Selective Memory: Decreased short-term memory;Decreased recall of precautions Following Commands: Follows multi-step commands inconsistently     Problem Solving: Requires verbal cues;Difficulty sequencing General Comments: Anxious with mobility and fearful of falling, poor attention due to this requiring frequent redirection to task; responded well to cues and encouragement   General Comments       Exercises     Shoulder Instructions      Home Living Family/patient expects to be discharged to:: Skilled nursing facility Living Arrangements: Spouse/significant other Available Help at Discharge: Family Type of Home: Apartment Home Access: Level entry;Elevator     Home Layout: One level               Home Equipment: Walker - 4 wheels   Additional Comments: Recent d/c to [facility] SNF      Prior Functioning/Environment Level of Independence: Independent with assistive device(s);Independent        Comments: Pt was  mod indep with intermittent use of RW prior to TKA (09/22/18); since fall and RLE fx, was d/c to SNF for rehab        OT Problem List: Decreased strength;Decreased range of motion;Decreased activity tolerance;Impaired balance (sitting and/or standing);Decreased safety awareness;Decreased knowledge of use of DME or AE;Decreased knowledge of precautions;Pain;Increased edema      OT Treatment/Interventions: Self-care/ADL training;Therapeutic exercise;DME and/or AE instruction;Therapeutic activities;Patient/family education;Balance training    OT Goals(Current goals can be found in the care plan section) Acute Rehab OT Goals Patient Stated Goal: be able to walk OT Goal Formulation: With patient Time For Goal Achievement: 10/25/18 Potential to Achieve Goals: Good  OT Frequency: Min 2X/week   Barriers to D/C:            Co-evaluation PT/OT/SLP Co-Evaluation/Treatment: Yes Reason for Co-Treatment: For patient/therapist safety;To address functional/ADL transfers PT goals addressed during session: Mobility/safety with mobility;Balance;Proper use of DME OT goals addressed during session: ADL's and self-care;Proper use of Adaptive equipment and DME      AM-PAC OT "6 Clicks" Daily Activity     Outcome Measure Help from another person eating meals?: A Little Help from another person taking care of personal grooming?: A Lot Help from another person toileting, which includes using toliet, bedpan, or urinal?: A Lot Help from another person bathing (including washing, rinsing, drying)?: A Lot Help from another person to put on and taking off regular upper body  clothing?: A Little Help from another person to put on and taking off regular lower body clothing?: A Lot 6 Click Score: 14   End of Session Equipment Utilized During Treatment: Gait belt;Rolling walker Nurse Communication: Mobility status;Need for lift equipment  Activity Tolerance: Patient limited by pain Patient left: in bed;with  call bell/phone within reach  OT Visit Diagnosis: Unsteadiness on feet (R26.81);Muscle weakness (generalized) (M62.81);Pain Pain - Right/Left: Right                Time: 9470-7615 OT Time Calculation (min): 32 min Charges:  OT General Charges $OT Visit: 1 Visit OT Evaluation $OT Eval Moderate Complexity: [place], MSOT, OTR/L  Supplemental Rehabilitation Services  [phone]   Marius Ditch 10/11/2018, 4:08 PM

## 2018-10-11 NOTE — Progress Notes (Signed)
Pt had multiple loose stools today after Lactulose was started. No bloody stools noted. MASD noted to perineal area and buttocks. Antifungal powder applied, Dr Reesa Chew notified, added Nystatin cream. Updated pt's spouse Temima Kutsch.

## 2018-10-11 NOTE — Progress Notes (Addendum)
PROGRESS NOTE    EDLA PARA  IRJ:[id number] DOB: Mar 28, 1945 DOA: 10/10/2018 PCP: Rosine Door   Brief Narrative:  73 year old with [name] old cirrhosis, radiation proctitis, endometrial cancer treated with hysterectomy and radiation, diabetes mellitus type 2 who recently underwent right total knee arthroplasty one 7/2 subsequently fell again dislocating this area requiring revision 7/7.  Came to the hospital for evaluation of bright red blood per rectum.  Patient was discharged previously on aspirin and Xarelto?.  Initially patient presented at [facility] rocking him and then was transferred here.   Assessment & Plan:   Active Problems:   DM2 (diabetes mellitus, type 2) (HCC)   Hypertension   Hyperlipidemia   Status post total knee replacement, right   Depression   Non-alcoholic cirrhosis (HCC)   Lower GI bleed   Anasarca   Hypoalbuminemia   AKI (acute kidney injury) ([place])  Bright red blood per rectum, improved Iron deficiency anemia, normocytic - Low iron saturation and relatively low ferritin levels.  Start iron supplements with bowel regimen.  Holding off anticoagulation. IV Iron. -Would benefit from endoscopic evaluation-lower and upper endoscopy.  GI team is following.  Acute encephalopathy, hepatic -Ammonia levels elevated.  Will prescribe lactulose 3 times daily.  Titrate to 3-4 soft bowel movements daily. -TSH within normal limits  Mild urinary tract infection -IV Rocephin.  Cirrhosis, NALFD -Unknown history of varices or portal gastropathy.  Endoscopy in 2016 but unknown results at this time.  Would benefit from repeat endoscopy to further characterize this. -Right upper quadrant ultrasound-pending  Bilateral lower extremity swelling/anasarca Hypoalbuminemia -This is secondary to underlying liver issues.  Albumin given overnight.  Essential hypertension -On hold in the setting of active bleed- on atenolol, diltiazem  Hyperlipidemia -Zocor 40 mg daily   Depression - Celexa 40 mg daily  Diabetes mellitus type 2 -Insulin sliding scale and Accu-Chek. - Home long-acting insulin on hold, on glargine 65 units daily. -Janumet and glipizide on hold.  Status post recent right knee arthroplasty -Pain control.  Holding off anticoagulation due to bleeding.  DVT prophylaxis: SCDs Code Status: Full code Family Communication: None at bedside Disposition Plan: To be determined  Consultants:   GI  Procedures:   None  Antimicrobials:   1 dose of IV Rocephin   Subjective: Feels ok, but overall very slow to speak.  Concerned about her blood glucose dropping this morning.  Reports of LE b/l swelling which is chronic and causing her discomfort.   Review of Systems Otherwise negative except as per HPI, including: General: Denies fever, chills, night sweats or unintended weight loss. Resp: Denies cough, wheezing, shortness of breath. Cardiac: Denies chest pain, palpitations, orthopnea, paroxysmal nocturnal dyspnea. GI: Denies abdominal pain, nausea, vomiting, diarrhea or constipation GU: Denies dysuria, frequency, hesitancy or incontinence MS: Denies muscle aches, joint pain or swelling Neuro: Denies headache, neurologic deficits (focal weakness, numbness, tingling), abnormal gait Psych: Denies anxiety, depression, SI/HI/AVH Skin: Denies new rashes or lesions ID: Denies sick contacts, exotic exposures, travel  Objective: Vitals:   10/11/18 0015 10/11/18 0030 10/11/18 0229 10/11/18 0745  BP: (!) 122/58 (!) 121/55 (!) 139/51 (!) 127/53  Pulse: 83 83 79 74  Resp: 13 18 15 18   Temp:   98.2 F (36.8 C) 97.7 F (36.5 C)  TempSrc:   Oral Oral  SpO2: 95% 98% 99% 100%  Weight:      Height:        Intake/Output Summary (Last 24 hours) at 10/11/2018 1409 Last data filed at 10/11/2018  1226 Gross per 24 hour  Intake 315.15 ml  Output 1150 ml  Net -834.85 ml   Filed Weights   10/10/18 2105  Weight: 95.1 kg    Examination:   General exam: Appears calm and comfortable  Respiratory system: Bibasilar crackles Cardiovascular system: S1 & S2 heard, RRR. No JVD, murmurs, rubs, gallops or clicks.  3+ bilateral pitting edema Gastrointestinal system: Abdomen is nondistended, soft and nontender. No organomegaly or masses felt. Normal bowel sounds heard. Central nervous system: Alert and oriented. No focal neurological deficits.  Overall slightly slow to respond but no asterixis Extremities: Symmetric 4 x 5 power. Skin: No rashes, lesions or ulcers Psychiatry: Judgement and insight appear normal. Mood & affect appropriate.     Data Reviewed:   CBC: Recent Labs  Lab 10/10/18 2127 10/11/18 0256 10/11/18 0913  WBC 3.6* 3.6* 3.2*  NEUTROABS 2.2  --   --   HGB 7.7* 7.8* 7.7*  HCT 25.2* 25.2* 24.5*  MCV 99.6 97.7 97.2  PLT 87* 80* 82*   Basic Metabolic Panel: Recent Labs  Lab 10/10/18 2127 10/11/18 0256  NA 132* 132*  K 4.5 5.1  CL 105 105  CO2 22 19*  GLUCOSE 104* 123*  BUN 24* 23  CREATININE 1.51* 1.47*  CALCIUM 7.9* 7.8*  MG  --  1.9  PHOS  --  4.2   GFR: Estimated Creatinine Clearance: 38.3 mL/min (A) (by C-G formula based on SCr of 1.47 mg/dL (H)). Liver Function Tests: Recent Labs  Lab 10/10/18 2127 10/11/18 0256  AST 60* 74*  ALT 42 41  ALKPHOS 141* 141*  BILITOT 1.4* 1.0  PROT 5.0* 4.9*  ALBUMIN 2.0* 2.0*   No results for input(s): LIPASE, AMYLASE in the last 168 hours. Recent Labs  Lab 10/11/18 0248  AMMONIA 61*   Coagulation Profile: Recent Labs  Lab 10/10/18 2127  INR 1.5*   Cardiac Enzymes: No results for input(s): CKTOTAL, CKMB, CKMBINDEX, TROPONINI in the last 168 hours. BNP (last 3 results) No results for input(s): PROBNP in the last 8760 hours. HbA1C: No results for input(s): HGBA1C in the last 72 hours. CBG: Recent Labs  Lab 10/10/18 2234 10/11/18 0232 10/11/18 0745 10/11/18 0910 10/11/18 1134  GLUCAP 130* 122* 89 91 116*   Lipid Profile: No results  for input(s): CHOL, HDL, LDLCALC, TRIG, CHOLHDL, LDLDIRECT in the last 72 hours. Thyroid Function Tests: Recent Labs    10/11/18 0256  TSH 3.442   Anemia Panel: Recent Labs    10/11/18 0248  VITAMINB12 484  FOLATE 11.2  FERRITIN 56  TIBC 325  IRON 24*  RETICCTPCT 4.5*   Sepsis Labs: No results for input(s): PROCALCITON, LATICACIDVEN in the last 168 hours.  Recent Results (from the past 240 hour(s))  SARS Coronavirus 2 (CEPHEID - Performed in [place] hospital lab), Hosp Order     Status: None   Collection Time: 10/10/18 10:02 PM   Specimen: Nasopharyngeal Swab  Result Value Ref Range Status   SARS Coronavirus 2 NEGATIVE NEGATIVE Final    Comment: (NOTE) If result is NEGATIVE SARS-CoV-2 target nucleic acids are NOT DETECTED. The SARS-CoV-2 RNA is generally detectable in upper and lower  respiratory specimens during the acute phase of infection. The lowest  concentration of SARS-CoV-2 viral copies this assay can detect is 250  copies / mL. A negative result does not preclude SARS-CoV-2 infection  and should not be used as the sole basis for treatment or other  patient management decisions.  A negative result  may occur with  improper specimen collection / handling, submission of specimen other  than nasopharyngeal swab, presence of viral mutation(s) within the  areas targeted by this assay, and inadequate number of viral copies  (<250 copies / mL). A negative result must be combined with clinical  observations, patient history, and epidemiological information. If result is POSITIVE SARS-CoV-2 target nucleic acids are DETECTED. The SARS-CoV-2 RNA is generally detectable in upper and lower  respiratory specimens dur ing the acute phase of infection.  Positive  results are indicative of active infection with SARS-CoV-2.  Clinical  correlation with patient history and other diagnostic information is  necessary to determine patient infection status.  Positive results do   not rule out bacterial infection or co-infection with other viruses. If result is PRESUMPTIVE POSTIVE SARS-CoV-2 nucleic acids MAY BE PRESENT.   A presumptive positive result was obtained on the submitted specimen  and confirmed on repeat testing.  While 2019 novel coronavirus  (SARS-CoV-2) nucleic acids may be present in the submitted sample  additional confirmatory testing may be necessary for epidemiological  and / or clinical management purposes  to differentiate between  SARS-CoV-2 and other Sarbecovirus currently known to infect humans.  If clinically indicated additional testing with an alternate test  methodology [phone]) is advised. The SARS-CoV-2 RNA is generally  detectable in upper and lower respiratory sp ecimens during the acute  phase of infection. The expected result is Negative. Fact Sheet for Patients:  StrictlyIdeas.no Fact Sheet for Healthcare Providers: BankingDealers.co.za This test is not yet approved or cleared by the [place] FDA and has been authorized for detection and/or diagnosis of SARS-CoV-2 by FDA under an Emergency Use Authorization (EUA).  This EUA will remain in effect (meaning this test can be used) for the duration of the COVID-19 declaration under Section 564(b)(1) of the Act, 21 U.S.C. section 360bbb-3(b)(1), unless the authorization is terminated or revoked sooner. Performed at [facility], [place] [address] [place], [place] [address]          Radiology Studies: No results found.      Scheduled Meds: . citalopram  40 mg Oral Daily  . feeding supplement (ENSURE ENLIVE)  237 mL Oral BID BM  . insulin aspart  0-9 Units Subcutaneous Q4H  . lactulose  20 g Oral TID  . loratadine  10 mg Oral Daily  . montelukast  10 mg Oral QHS  . simvastatin  40 mg Oral QHS  . sodium chloride flush  3 mL Intravenous Q12H   Continuous Infusions: . sodium chloride Stopped (10/11/18 0444)      LOS: 1 day   Time spent= 35 mins    Ankit Arsenio Loader, MD [facility]  If 7PM-7AM, please contact night-coverage [email or web address] 10/11/2018, 2:09 PM

## 2018-10-11 NOTE — Progress Notes (Signed)
Inpatient Diabetes Program Recommendations  AACE/ADA: New Consensus Statement on Inpatient Glycemic Control   Target Ranges:  Prepandial:   less than 140 mg/dL      Peak postprandial:   less than 180 mg/dL (1-2 hours)      Critically ill patients:  140 - 180 mg/dL   Results for JAYMIE, MCKIDDY (MRN [id number]) as of 10/11/2018 14:19  Ref. Range 10/10/2018 20:53 10/10/2018 21:50 10/10/2018 22:34 10/11/2018 02:32 10/11/2018 07:45 10/11/2018 09:10 10/11/2018 11:34  Glucose-Capillary Latest Ref Range: 70 - 99 mg/dL 101 (H) 86 130 (H) 122 (H) 89 91 116 (H)   Results for JOYCELYNN, FRITSCHE (MRN [id number]) as of 10/11/2018 14:19  Ref. Range 09/22/2018 05:40  Hemoglobin A1C Latest Ref Range: 4.8 - 5.6 % 6.2 (H)   Review of Glycemic Control  Diabetes history: DM2 Outpatient Diabetes medications: Glipizide 5 mg BID, Basaglar 65 units daily, Janumet 50-500 mg BID Current orders for Inpatient glycemic control: Novolog 0-9 units Q4H  NOTE: Noted consult for Diabetes Coordinator. Chart reviewed. Noted patient was inpatient 09/24/18 to 09/30/18 and was ordered Lantus 65 units daily and Novolog 0-15 units TID and Novolog 0-5 units QHS while inpatient with fair glycemic control. Patient has not received any basal insulin since being admitted and fasting glucose 89 mg/dl and CBG 116 mg/dl at 11:34 am today. Agree with current orders for glycemic control at this time.  Spoke with patient over the phone and she reports that since she has been at [facility] she thinks she has been getting Basaglar, Glipizide, and Janumet as noted above. Patient is not sure if she received Basaglar yesterday at [facility] or not. Patient reports that glucose has ranged from 70-250 mg/dl at [facility] over the past few days.  Patient does not feel she needs any basal insulin at this time because her glucose is trending low (for her normal). Explained that currently she has Novolog correction ordered and no basal insulin. Anticipate if patient received Basaglar yesterday  at [facility] that it will begin to wear off and glucose will likely increase. Patient verbalized understanding of information discussed and she states she has no questions at this time. Called [facility] to inquire whether patient received Basaglar yesterday but nurse off unit at this time so not able to confirm if patient received Basaglar on 10/10/18 or not.  Will follow glucose trends. Once glucose begins to increase and is consistently greater than 180 mg/dl with Novolog correction, will likely need to order basal insulin.  Thanks, Barnie Alderman, RN, MSN, CDE Diabetes Coordinator Inpatient Diabetes Program [phone] (Team Pager from 8am to 5pm)

## 2018-10-11 NOTE — Progress Notes (Signed)
CSW acknowledges patient from [facility], will continue to follow for discharge planning once patient medically cleared.   Brittany Russo, [place]

## 2018-10-11 NOTE — [place] FL2 (Signed)
[place] MEDICAID FL2 LEVEL OF CARE SCREENING TOOL     IDENTIFICATION  Patient Name: Brittany Russo Birthdate: Aug 29, 1945 Sex: female Admission Date (Current Location): 10/10/2018  [facility] and [place] Number:  [employer] and Address:  [facility] [place], [place] [address], [place], [place] [address]      Provider Number: [id number]  Attending Physician Name and Address:  Damita Lack, MD  Relative Name and Phone Number:  [phone]    Current Level of Care: Hospital Recommended Level of Care: [place] Prior Approval Number:    Date Approved/Denied:   PASRR Number: (under review)  Discharge Plan: SNF    Current Diagnoses: Patient Active Problem List   Diagnosis Date Noted  . AKI (acute kidney injury) ([place]) 10/11/2018  . Lower GI bleed 10/10/2018  . Anasarca 10/10/2018  . Hypoalbuminemia 10/10/2018  . Depression 09/28/2018  . Acute hepatic encephalopathy 09/28/2018  . Non-alcoholic cirrhosis ([place]) 48/54/6270  . Periprosthetic fracture around internal prosthetic right knee joint 09/24/2018  . S/P right TKA 09/22/2018  . Status post total knee replacement, right 09/22/2018  . DM2 (diabetes mellitus, type 2) ([place]) 07/11/2015  . Hypertension 07/11/2015  . Hyperlipidemia 07/11/2015    Orientation RESPIRATION BLADDER Height & Weight     Self, Time, Situation, Place  Normal Incontinent, External catheter Weight: 209 lb 10.5 oz (95.1 kg) Height:  5\' 2"  (157.5 cm)  BEHAVIORAL SYMPTOMS/MOOD NEUROLOGICAL BOWEL NUTRITION STATUS      Continent Diet(see discharge summary)  AMBULATORY STATUS COMMUNICATION OF NEEDS Skin   Extensive Assist Verbally Other (Comment)(MASD perineum and groin, Eczema arms and hands)                       Personal Care Assistance Level of Assistance  Bathing, Feeding, Dressing, Total care Bathing Assistance: Limited assistance Feeding assistance: Independent Dressing Assistance: Limited  assistance Total Care Assistance: Maximum assistance   Functional Limitations Info  Sight, Hearing, Speech Sight Info: Adequate Hearing Info: Adequate Speech Info: Adequate    SPECIAL CARE FACTORS FREQUENCY  PT (By licensed PT), OT (By licensed OT)     PT Frequency: min 5x weekly OT Frequency: min 5x weekly            Contractures Contractures Info: Not present    Additional Factors Info  Code Status, Allergies Code Status Info: full Allergies Info: Asa (aspirin), any jewelry metal - hives and itching           Current Medications (10/11/2018):  This is the current hospital active medication list Current Facility-Administered Medications  Medication Dose Route Frequency Provider Last Rate Last Dose  . 0.9 %  sodium chloride infusion  250 mL Intravenous PRN Toy Baker, MD   Stopped at 10/11/18 0444  . acetaminophen (TYLENOL) tablet 650 mg  650 mg Oral Q6H PRN Toy Baker, MD       Or  . acetaminophen (TYLENOL) suppository 650 mg  650 mg Rectal Q6H PRN Doutova, Anastassia, MD      . alum & mag hydroxide-simeth (MAALOX/MYLANTA) 200-200-20 MG/5ML suspension 30 mL  30 mL Oral Q4H PRN Amin, Ankit Chirag, MD      . citalopram (CELEXA) tablet 40 mg  40 mg Oral Daily Doutova, Anastassia, MD   40 mg at 10/11/18 0947  . feeding supplement (ENSURE ENLIVE) (ENSURE ENLIVE) liquid 237 mL  237 mL Oral BID BM Amin, Ankit Chirag, MD      . guaiFENesin-dextromethorphan (ROBITUSSIN DM)  100-10 MG/5ML syrup 5 mL  5 mL Oral Q4H PRN Amin, Ankit Chirag, MD      . hydrALAZINE (APRESOLINE) injection 10 mg  10 mg Intravenous Q4H PRN Amin, Ankit Chirag, MD      . HYDROcodone-acetaminophen (NORCO/VICODIN) 5-325 MG per tablet 1-2 tablet  1-2 tablet Oral Q4H PRN Toy Baker, MD   2 tablet at 10/11/18 0037  . hydrocortisone (ANUSOL-HC) 2.5 % rectal cream 1 application  1 application Topical QID PRN Amin, Ankit Chirag, MD      . hydrocortisone cream 1 % 1 application  1 application  Topical TID PRN Amin, Ankit Chirag, MD      . insulin aspart (novoLOG) injection 0-9 Units  0-9 Units Subcutaneous Q4H Toy Baker, MD   1 Units at 10/11/18 0347  . lactulose (CHRONULAC) 10 GM/15ML solution 20 g  20 g Oral TID Damita Lack, MD   20 g at 10/11/18 0947  . lip balm (CARMEX) ointment 1 application  1 application Topical PRN Amin, Ankit Chirag, MD      . loratadine (CLARITIN) tablet 10 mg  10 mg Oral Daily Doutova, Anastassia, MD   10 mg at 10/11/18 0947  . loratadine (CLARITIN) tablet 10 mg  10 mg Oral Daily PRN Amin, Ankit Chirag, MD      . methocarbamol (ROBAXIN) tablet 500 mg  500 mg Oral Q6H PRN Doutova, Anastassia, MD      . montelukast (SINGULAIR) tablet 10 mg  10 mg Oral QHS Doutova, Anastassia, MD   10 mg at 10/11/18 0351  . Muscle Rub CREA 1 application  1 application Topical PRN Amin, Ankit Chirag, MD      . ondansetron (ZOFRAN) tablet 4 mg  4 mg Oral Q6H PRN Doutova, Anastassia, MD       Or  . ondansetron (ZOFRAN) injection 4 mg  4 mg Intravenous Q6H PRN Doutova, Anastassia, MD      . phenol (CHLORASEPTIC) mouth spray 1 spray  1 spray Mouth/Throat PRN Amin, Ankit Chirag, MD      . polyethylene glycol (MIRALAX / GLYCOLAX) packet 17 g  17 g Oral Daily PRN Amin, Ankit Chirag, MD      . polyvinyl alcohol (LIQUIFILM TEARS) 1.4 % ophthalmic solution 1 drop  1 drop Both Eyes PRN Amin, Ankit Chirag, MD      . senna-docusate (Senokot-S) tablet 2 tablet  2 tablet Oral QHS PRN Amin, Ankit Chirag, MD      . simvastatin (ZOCOR) tablet 40 mg  40 mg Oral QHS Doutova, Anastassia, MD   40 mg at 10/11/18 0351  . sodium chloride (OCEAN) 0.65 % nasal spray 1 spray  1 spray Each Nare PRN Amin, Ankit Chirag, MD      . sodium chloride flush (NS) 0.9 % injection 3 mL  3 mL Intravenous Q12H Doutova, Anastassia, MD   3 mL at 10/11/18 0354  . sodium chloride flush (NS) 0.9 % injection 3 mL  3 mL Intravenous PRN Toy Baker, MD         Discharge Medications: Please see  discharge summary for a list of discharge medications.  Relevant Imaging Results:  Relevant Lab Results:   Additional Information SSN: [id number]  Alberteen Sam, LCSW

## 2018-10-11 NOTE — Progress Notes (Signed)
Patient not available.

## 2018-10-11 NOTE — Progress Notes (Signed)
Initial Nutrition Assessment  DOCUMENTATION CODES:   Obesity unspecified  INTERVENTION:  Provide Ensure Enlive po BID, each supplement provides 350 kcal and 20 grams of protein.  Encourage adequate PO intake.   NUTRITION DIAGNOSIS:   Increased nutrient needs related to chronic illness(NAFLD cirrhosis) as evidenced by estimated needs.  GOAL:   Patient will meet greater than or equal to 90% of their needs  MONITOR:   PO intake, Supplement acceptance, Skin, Weight trends, Labs, I & O's  REASON FOR ASSESSMENT:   Consult Assessment of nutrition requirement/status  ASSESSMENT:   73 y.o. female with medical history significant of cirrhosis of the liver, depression, diabetes type 2, endometrial cancer status post radiation therapy, HLD, HTN, seizures thyroid nodule, vaginal cancer presents with GI bleed.  Pt reports having a decreased appetite over the past 2 weeks. Pt presents from rehab located in [place]. Pt reports dislike of food at facility thus did not eat much at meals, however pt reports family did bring in food from outside, such as sandwiches and soups. Pt with no weight loss weight per records. RD to order nutritional supplements to aid in caloric and protein needs. Pt encouraged to eat her food at meals.   Unable to complete Nutrition-Focused physical exam at this time as pt needing to be cleaned up by nursing staff.   Labs and medications reviewed.   Diet Order:   Diet Order            Diet Carb Modified Fluid consistency: Thin; Room service appropriate? Yes  Diet effective now              EDUCATION NEEDS:   Not appropriate for education at this time  Skin:  Skin Assessment: Reviewed RN Assessment  Last BM:  Unknown  Height:   Ht Readings from Last 1 Encounters:  10/10/18 5\' 2"  (1.575 m)    Weight:   Wt Readings from Last 1 Encounters:  10/10/18 95.1 kg    Ideal Body Weight:  50 kg  BMI:  Body mass index is 38.35 kg/m.  Estimated  Nutritional Needs:   Kcal:  1800-1950  Protein:  90-105 grams  Fluid:  >/= 1.8 L/day    Corrin Parker, MS, RD, LDN Pager # [phone] After hours/ weekend pager # [phone]

## 2018-10-12 ENCOUNTER — Inpatient Hospital Stay (HOSPITAL_COMMUNITY): Payer: Medicare Other

## 2018-10-12 DIAGNOSIS — I361 Nonrheumatic tricuspid (valve) insufficiency: Secondary | ICD-10-CM

## 2018-10-12 DIAGNOSIS — R188 Other ascites: Secondary | ICD-10-CM

## 2018-10-12 DIAGNOSIS — K625 Hemorrhage of anus and rectum: Secondary | ICD-10-CM

## 2018-10-12 LAB — COMPREHENSIVE METABOLIC PANEL
ALT: 36 U/L (ref 0–44)
AST: 48 U/L — ABNORMAL HIGH (ref 15–41)
Albumin: 1.8 g/dL — ABNORMAL LOW (ref 3.5–5.0)
Alkaline Phosphatase: 126 U/L (ref 38–126)
Anion gap: 4 — ABNORMAL LOW (ref 5–15)
BUN: 18 mg/dL (ref 8–23)
CO2: 23 mmol/L (ref 22–32)
Calcium: 8 mg/dL — ABNORMAL LOW (ref 8.9–10.3)
Chloride: 108 mmol/L (ref 98–111)
Creatinine, Ser: 1.15 mg/dL — ABNORMAL HIGH (ref 0.44–1.00)
GFR calc Af Amer: 56 mL/min — ABNORMAL LOW (ref >60)
GFR calc non Af Amer: 48 mL/min — ABNORMAL LOW (ref >60)
Glucose, Bld: 123 mg/dL — ABNORMAL HIGH (ref 70–99)
Potassium: 4.4 mmol/L (ref 3.5–5.1)
Sodium: 135 mmol/L (ref 135–145)
Total Bilirubin: 1.1 mg/dL (ref 0.3–1.2)
Total Protein: 4.4 g/dL — ABNORMAL LOW (ref 6.5–8.1)

## 2018-10-12 LAB — CBC
HCT: 22.3 % — ABNORMAL LOW (ref 36.0–46.0)
Hemoglobin: 7 g/dL — ABNORMAL LOW (ref 12.0–15.0)
MCH: 30.7 pg (ref 26.0–34.0)
MCHC: 31.4 g/dL (ref 30.0–36.0)
MCV: 97.8 fL (ref 80.0–100.0)
Platelets: 63 10*3/uL — ABNORMAL LOW (ref 150–400)
RBC: 2.28 MIL/uL — ABNORMAL LOW (ref 3.87–5.11)
RDW: 18.2 % — ABNORMAL HIGH (ref 11.5–15.5)
WBC: 2.3 10*3/uL — ABNORMAL LOW (ref 4.0–10.5)
nRBC: 0 % (ref 0.0–0.2)

## 2018-10-12 LAB — GLUCOSE, CAPILLARY
Glucose-Capillary: 112 mg/dL — ABNORMAL HIGH (ref 70–99)
Glucose-Capillary: 121 mg/dL — ABNORMAL HIGH (ref 70–99)
Glucose-Capillary: 152 mg/dL — ABNORMAL HIGH (ref 70–99)
Glucose-Capillary: 162 mg/dL — ABNORMAL HIGH (ref 70–99)
Glucose-Capillary: 152 mg/dL — ABNORMAL HIGH (ref 70–99)

## 2018-10-12 LAB — AMMONIA: Ammonia: 26 umol/L (ref 9–35)

## 2018-10-12 LAB — MAGNESIUM: Magnesium: 1.7 mg/dL (ref 1.7–2.4)

## 2018-10-12 LAB — ECHOCARDIOGRAM COMPLETE
Height: 62 in
Weight: 3354.52 oz

## 2018-10-12 MED ORDER — FUROSEMIDE 40 MG PO TABS
40.0000 mg | ORAL_TABLET | Freq: Every day | ORAL | Status: DC
  Administered 2018-10-12 – 2018-10-14 (×3): 40 mg via ORAL
  Filled 2018-10-12 (×3): qty 1

## 2018-10-12 MED ORDER — HYDROCORTISONE ACETATE 25 MG RE SUPP
25.0000 mg | Freq: Two times a day (BID) | RECTAL | Status: DC
  Administered 2018-10-12 – 2018-10-13 (×2): 25 mg via RECTAL
  Filled 2018-10-12 (×6): qty 1

## 2018-10-12 MED ORDER — SPIRONOLACTONE 25 MG PO TABS
50.0000 mg | ORAL_TABLET | Freq: Every day | ORAL | Status: DC
  Administered 2018-10-12 – 2018-10-14 (×3): 50 mg via ORAL
  Filled 2018-10-12 (×3): qty 2

## 2018-10-12 MED ORDER — LACTULOSE 10 GM/15ML PO SOLN
20.0000 g | Freq: Every day | ORAL | Status: DC
  Administered 2018-10-13: 20 g via ORAL
  Filled 2018-10-12 (×2): qty 30

## 2018-10-12 MED ORDER — HYDROCORTISONE ACETATE 25 MG RE SUPP: 25.0000 mg | Freq: Two times a day (BID) | RECTAL | Status: DC

## 2018-10-12 MED ORDER — ALPRAZOLAM 0.25 MG PO TABS
0.2500 mg | ORAL_TABLET | Freq: Two times a day (BID) | ORAL | Status: DC | PRN
  Administered 2018-10-12 – 2018-10-14 (×5): 0.25 mg via ORAL
  Filled 2018-10-12 (×5): qty 1

## 2018-10-12 NOTE — Progress Notes (Signed)
Physical Therapy Treatment Patient Details Name: Brittany Russo MRN: [id number] DOB: Apr 03, 1945 Today's Date: 10/12/2018    History of Present Illness Pt is a 73 y.o. female admitted 10/10/18 with low hemoglobin and abdominal discomfort; suspect lower GI source. Of note, recent admission 09/22/18 for R TKA with subsequent fall resulting in readmission with R distal femur fx s/p TKA repair; pt then d/c to SNF 09/30/18. PMH includes HTN, DM2, obesity.    PT Comments    Session limited to bed mobility and LE exercises due to Hgb 7.0. Pt required min assist rolling in bed using bed rails. Assisted RN with pt care as pt was soiled from BM.  R knee immobilizer re-positioned as it had slid down. Pt educated on need to perform ankle pumps independently throughout the day.   Follow Up Recommendations  SNF;Supervision/Assistance - 24 hour     Equipment Recommendations  None recommended by PT    Recommendations for Other Services       Precautions / Restrictions Precautions Precautions: Fall;Knee Precaution Comments: No ROM to R knee Required Braces or Orthoses: Knee Immobilizer - Right Knee Immobilizer - Right: On at all times Restrictions RLE Weight Bearing: Weight bearing as tolerated Other Position/Activity Restrictions: No orders- per chart admission 1 wk ago, pt with RLE WBAT, R knee immobilizer on at all times, no R knee ROM    Mobility  Bed Mobility Overal bed mobility: Needs Assistance Bed Mobility: Rolling Rolling: Min assist         General bed mobility comments: min assist rolling, +rail  Transfers                    Ambulation/Gait                 Stairs             Wheelchair Mobility    Modified Rankin (Stroke Patients Only)       Balance                                            Cognition Arousal/Alertness: Awake/alert Behavior During Therapy: WFL for tasks assessed/performed;Anxious Overall Cognitive Status:  No family/caregiver present to determine baseline cognitive functioning Area of Impairment: Attention;Problem solving;Following commands                   Current Attention Level: Selective Memory: Decreased short-term memory;Decreased recall of precautions Following Commands: Follows multi-step commands inconsistently;Follows multi-step commands with increased time     Problem Solving: Requires verbal cues;Difficulty sequencing General Comments: increased anxiety. Pt reports she takes xanax but has not been receiving it here in the hospital.      Exercises General Exercises - Lower Extremity Ankle Circles/Pumps: AROM;Both;Supine;20 reps Heel Slides: AROM;Left;10 reps;Supine Hip ABduction/ADduction: AAROM;Right;Left;10 reps;Supine Straight Leg Raises: AAROM;Right;Left;5 reps;Supine    General Comments        Pertinent Vitals/Pain Pain Assessment: 0-10 Pain Score: 9  Pain Location: RLE Pain Descriptors / Indicators: Sore;Discomfort Pain Intervention(s): Limited activity within patient's tolerance;Repositioned;Patient requesting pain meds-RN notified    Home Living                      Prior Function            PT Goals (current goals can now be found in the care plan section) Acute Rehab PT Goals  Patient Stated Goal: be able to walk PT Goal Formulation: With patient Time For Goal Achievement: 10/25/18 Potential to Achieve Goals: Good Progress towards PT goals: Not progressing toward goals - comment(anxiety, pain, diarrhea, low hgb)    Frequency    Min 3X/week      PT Plan Current plan remains appropriate    Co-evaluation              AM-PAC PT "6 Clicks" Mobility   Outcome Measure  Help needed turning from your back to your side while in a flat bed without using bedrails?: A Lot Help needed moving from lying on your back to sitting on the side of a flat bed without using bedrails?: A Lot Help needed moving to and from a bed to a chair  (including a wheelchair)?: Total Help needed standing up from a chair using your arms (e.g., wheelchair or bedside chair)?: Total Help needed to walk in hospital room?: Total Help needed climbing 3-5 steps with a railing? : Total 6 Click Score: 8    End of Session Equipment Utilized During Treatment: Gait belt;Right knee immobilizer Activity Tolerance: Treatment limited secondary to medical complications (Comment)(hgb 7.0) Patient left: in bed;with call bell/phone within reach;with bed alarm set Nurse Communication: Mobility status;Need for lift equipment PT Visit Diagnosis: Other abnormalities of gait and mobility (R26.89);Muscle weakness (generalized) (M62.81) Pain - Right/Left: Right Pain - part of body: Knee;Leg     Time: 4268-3419 PT Time Calculation (min) (ACUTE ONLY): 24 min  Charges:  $Therapeutic Exercise: 8-22 mins $Therapeutic Activity: 8-22 mins                     Lorrin Goodell, PT  Office # [phone] Pager [phone]    Lorriane Shire 10/12/2018, 11:32 AM

## 2018-10-12 NOTE — Progress Notes (Signed)
Pt requesting xanax, states she takes it twice a day at home  Informed MD via secure chat  [place] to verify home meds, stated technician will be by soon  Room lights turned down, pt has all belongings in reach Will continue to monitor

## 2018-10-12 NOTE — Progress Notes (Signed)
PASRR number received: [id number]   Patient can discharge back to [facility] once medically stable.   [place], [place]

## 2018-10-12 NOTE — Progress Notes (Signed)
Assisted pt in ordering breakfast  

## 2018-10-12 NOTE — Progress Notes (Signed)
PROGRESS NOTE    Brittany Russo  EXH:[id number] DOB: [birth year] DOA: 10/10/2018 PCP: Rosine Door   Brief Narrative:  73 year old with [name] old cirrhosis, radiation proctitis, endometrial cancer treated with hysterectomy and radiation, diabetes mellitus type 2 who recently underwent right total knee arthroplasty one 7/2 subsequently fell again dislocating this area requiring revision 7/7.  Came to the hospital for evaluation of bright red blood per rectum.  Patient was discharged previously on aspirin and Xarelto?.  Initially patient presented at [facility] rocking him and then was transferred here.  GI recommended Anusol suppository and monitoring hemoglobin levels.  Received lactulose for hepatic encephalopathy during the hospitalization.   Assessment & Plan:   Active Problems:   DM2 (diabetes mellitus, type 2) (HCC)   Hypertension   Hyperlipidemia   Status post total knee replacement, right   Depression   Non-alcoholic cirrhosis (HCC)   Lower GI bleed   Anasarca   Hypoalbuminemia   AKI (acute kidney injury) ([place])  Bright red blood per rectum, improved Iron deficiency anemia, normocytic - Low iron saturations and ferritin level borderline low as well.  Iron supplements with bowel regimen. -GI team following-Anusol rectally recommended -Patient should eventually benefit from endoscopic evaluation.  Either inpatient or defer to outpatient gastroenterology.  Acute encephalopathy, hepatic -TSH within normal limits.  Initially ammonia levels were elevated which is trended down.  Lactulose 3 times daily and titrate to 3-4 soft bowel movements daily.  Mild urinary tract infection, present prior to admission.  POA -Patient had a Foley from outside hospital, this will be removed.  Continue IV Rocephin. - Nystatin powder for skin breakdown  Cirrhosis, NALFD -Unknown history of varices or portal gastropathy.  Endoscopy in 2016 but unknown results at this time.  Would benefit from  repeat endoscopy to further characterize this. -Right upper quadrant ultrasound- cirrhotic liver with ascites and right-sided pleural effusion. -Echocardiogram shows ejection fraction 60-65%  Bilateral lower extremity swelling/anasarca Hypoalbuminemia -This is secondary to underlying liver issues.  Albumin given overnight.  Essential hypertension -On hold in the setting of active bleed- on atenolol, diltiazem  Hyperlipidemia -Zocor 40 mg daily  Depression - Celexa 40 mg daily  Diabetes mellitus type 2 -Insulin sliding scale and Accu-Chek. - Home long-acting insulin on hold, on glargine 65 units daily. -Janumet and glipizide on hold.  Status post recent right knee arthroplasty -Pain control.  Holding off anticoagulation due to bleeding.  DVT prophylaxis: SCDs Code Status: Full code Family Communication: Spoke with her husband over the phone Disposition Plan: Will likely require skilled nursing facility placement once medically stable.  Consultants:   GI  Procedures:   None  Antimicrobials:   Rocephin   Subjective: Patient had about 3 bowel movements this morning.  Still feels extremely weak and tired overall.  Review of Systems Otherwise negative except as per HPI, including: General = no fevers, chills, dizziness, malaise, fatigue HEENT/EYES = negative for pain, redness, loss of vision, double vision, blurred vision, loss of hearing, sore throat, hoarseness, dysphagia Cardiovascular= negative for chest pain, palpitation, murmurs, lower extremity swelling Respiratory/lungs= negative for shortness of breath, cough, hemoptysis, wheezing, mucus production Gastrointestinal= negative for nausea, vomiting,, abdominal pain, melena, hematemesis Genitourinary= negative for Dysuria, Hematuria, Change in Urinary Frequency MSK = Negative for arthralgia, myalgias, Back Pain, Joint swelling  Neurology= Negative for headache, seizures, numbness, tingling  Psychiatry= Negative  for anxiety, depression, suicidal and homocidal ideation Allergy/Immunology= Medication/Food allergy as listed  Skin= Negative for Rash, lesions, ulcers, itching  Objective: Vitals:   10/11/18 0745 10/11/18 1525 10/11/18 1929 10/12/18 0804  BP: (!) 127/53 127/61 133/66 (!) 153/61  Pulse: 74 81 89 91  Resp: 18 17 16 17   Temp: 97.7 F (36.5 C) 98.4 F (36.9 C) 98.3 F (36.8 C) 97.9 F (36.6 C)  TempSrc: Oral Oral Oral Oral  SpO2: 100% 99% 96% 95%  Weight:      Height:        Intake/Output Summary (Last 24 hours) at 10/12/2018 1158 Last data filed at 10/12/2018 2774 Gross per 24 hour  Intake 448.94 ml  Output 1550 ml  Net -1101.06 ml   Filed Weights   10/10/18 2105  Weight: 95.1 kg    Examination:  Constitutional: NAD, calm, comfortable, anasarca Eyes: PERRL, lids and conjunctivae normal ENMT: Mucous membranes are moist. Posterior pharynx clear of any exudate or lesions.Normal dentition.  Neck: normal, supple, no masses, no thyromegaly Respiratory: Bibasilar crackles more on the right than left.  Cardiovascular: Regular rate and rhythm, no murmurs / rubs / gallops.  3+ bilateral lower extremity pitting edema 2+ pedal pulses. No carotid bruits.  Abdomen: no tenderness, no masses palpated. No hepatosplenomegaly. Bowel sounds positive.  Musculoskeletal: no clubbing / cyanosis. No joint deformity upper and lower extremities. Good ROM, no contractures. Normal muscle tone.  Skin: Lower extremity dressing noted from her recent surgery. Neurologic: CN 2-12 grossly intact. Sensation intact, DTR normal. Strength 4/5 in all 4.  Psychiatric: Normal judgment and insight. Alert and oriented x 3. Normal mood.      Data Reviewed:   CBC: Recent Labs  Lab 10/10/18 2127 10/11/18 0256 10/11/18 0913 10/12/18 0154  WBC 3.6* 3.6* 3.2* 2.3*  NEUTROABS 2.2  --   --   --   HGB 7.7* 7.8* 7.7* 7.0*  HCT 25.2* 25.2* 24.5* 22.3*  MCV 99.6 97.7 97.2 97.8  PLT 87* 80* 82* 63*   Basic  Metabolic Panel: Recent Labs  Lab 10/10/18 2127 10/11/18 0256 10/12/18 0154  NA 132* 132* 135  K 4.5 5.1 4.4  CL 105 105 108  CO2 22 19* 23  GLUCOSE 104* 123* 123*  BUN 24* 23 18  CREATININE 1.51* 1.47* 1.15*  CALCIUM 7.9* 7.8* 8.0*  MG  --  1.9 1.7  PHOS  --  4.2  --    GFR: Estimated Creatinine Clearance: 48.9 mL/min (A) (by C-G formula based on SCr of 1.15 mg/dL (H)). Liver Function Tests: Recent Labs  Lab 10/10/18 2127 10/11/18 0256 10/12/18 0154  AST 60* 74* 48*  ALT 42 41 36  ALKPHOS 141* 141* 126  BILITOT 1.4* 1.0 1.1  PROT 5.0* 4.9* 4.4*  ALBUMIN 2.0* 2.0* 1.8*   No results for input(s): LIPASE, AMYLASE in the last 168 hours. Recent Labs  Lab 10/11/18 0248 10/12/18 0154  AMMONIA 61* 26   Coagulation Profile: Recent Labs  Lab 10/10/18 2127  INR 1.5*   Cardiac Enzymes: No results for input(s): CKTOTAL, CKMB, CKMBINDEX, TROPONINI in the last 168 hours. BNP (last 3 results) No results for input(s): PROBNP in the last 8760 hours. HbA1C: No results for input(s): HGBA1C in the last 72 hours. CBG: Recent Labs  Lab 10/11/18 1647 10/11/18 1930 10/11/18 2345 10/12/18 0414 10/12/18 0759  GLUCAP 116* 179* 133* 112* 121*   Lipid Profile: No results for input(s): CHOL, HDL, LDLCALC, TRIG, CHOLHDL, LDLDIRECT in the last 72 hours. Thyroid Function Tests: Recent Labs    10/11/18 0256  TSH 3.442   Anemia Panel: Recent Labs  10/11/18 0248  VITAMINB12 484  FOLATE 11.2  FERRITIN 56  TIBC 325  IRON 24*  RETICCTPCT 4.5*   Sepsis Labs: No results for input(s): PROCALCITON, LATICACIDVEN in the last 168 hours.  Recent Results (from the past 240 hour(s))  SARS Coronavirus 2 (CEPHEID - Performed in [place] hospital lab), Hosp Order     Status: None   Collection Time: 10/10/18 10:02 PM   Specimen: Nasopharyngeal Swab  Result Value Ref Range Status   SARS Coronavirus 2 NEGATIVE NEGATIVE Final    Comment: (NOTE) If result is NEGATIVE SARS-CoV-2  target nucleic acids are NOT DETECTED. The SARS-CoV-2 RNA is generally detectable in upper and lower  respiratory specimens during the acute phase of infection. The lowest  concentration of SARS-CoV-2 viral copies this assay can detect is 250  copies / mL. A negative result does not preclude SARS-CoV-2 infection  and should not be used as the sole basis for treatment or other  patient management decisions.  A negative result may occur with  improper specimen collection / handling, submission of specimen other  than nasopharyngeal swab, presence of viral mutation(s) within the  areas targeted by this assay, and inadequate number of viral copies  (<250 copies / mL). A negative result must be combined with clinical  observations, patient history, and epidemiological information. If result is POSITIVE SARS-CoV-2 target nucleic acids are DETECTED. The SARS-CoV-2 RNA is generally detectable in upper and lower  respiratory specimens dur ing the acute phase of infection.  Positive  results are indicative of active infection with SARS-CoV-2.  Clinical  correlation with patient history and other diagnostic information is  necessary to determine patient infection status.  Positive results do  not rule out bacterial infection or co-infection with other viruses. If result is PRESUMPTIVE POSTIVE SARS-CoV-2 nucleic acids MAY BE PRESENT.   A presumptive positive result was obtained on the submitted specimen  and confirmed on repeat testing.  While 2019 novel coronavirus  (SARS-CoV-2) nucleic acids may be present in the submitted sample  additional confirmatory testing may be necessary for epidemiological  and / or clinical management purposes  to differentiate between  SARS-CoV-2 and other Sarbecovirus currently known to infect humans.  If clinically indicated additional testing with an alternate test  methodology [phone]) is advised. The SARS-CoV-2 RNA is generally  detectable in upper and lower  respiratory sp ecimens during the acute  phase of infection. The expected result is Negative. Fact Sheet for Patients:  StrictlyIdeas.no Fact Sheet for Healthcare Providers: BankingDealers.co.za This test is not yet approved or cleared by the [place] FDA and has been authorized for detection and/or diagnosis of SARS-CoV-2 by FDA under an Emergency Use Authorization (EUA).  This EUA will remain in effect (meaning this test can be used) for the duration of the COVID-19 declaration under Section 564(b)(1) of the Act, 21 U.S.C. section 360bbb-3(b)(1), unless the authorization is terminated or revoked sooner. Performed at [facility], [place] [address], [place], [place] [address]          Radiology Studies: US Abdomen Complete  Result Date: 10/11/2018 CLINICAL DATA:  Cirrhosis EXAM: ABDOMEN ULTRASOUND COMPLETE COMPARISON:  None. FINDINGS: Gallbladder: No gallstones or wall thickening visualized. No sonographic Murphy sign noted by sonographer. Common bile duct: Diameter: 6 mm Liver: Cirrhotic liver. No focal mass or lesion identified within the liver. Portal vein is patent on color Doppler imaging with normal direction of blood flow towards the liver. IVC: No abnormality visualized. Pancreas: Obscured by  overlying bowel gas. Spleen: Upper normal in size. Right Kidney: Length: 11.9 cm. Echogenicity within normal limits. No mass or hydronephrosis visualized. Left Kidney: Length: 11.4 cm. Echogenicity within normal limits. No mass or hydronephrosis visualized. Abdominal aorta: No aneurysm visualized. Mid to distal aorta obscured by overlying bowel gas. Other findings: Ascites.  RIGHT pleural effusion. IMPRESSION: 1. Cirrhotic liver.  No focal liver lesion identified. 2. Ascites. 3. RIGHT pleural effusion Electronically Signed   By: Franki Cabot M.D.   On: 10/11/2018 15:36        Scheduled Meds: . citalopram  40 mg Oral Daily  .  feeding supplement (ENSURE ENLIVE)  237 mL Oral BID BM  . insulin aspart  0-9 Units Subcutaneous Q4H  . lactulose  20 g Oral Q6H  . loratadine  10 mg Oral Daily  . montelukast  10 mg Oral QHS  . nystatin cream  1 application Topical BID  . simvastatin  40 mg Oral QHS  . sodium chloride flush  3 mL Intravenous Q12H   Continuous Infusions: . sodium chloride Stopped (10/11/18 3291)  . cefTRIAXone (ROCEPHIN)  IV 1 g (10/11/18 1613)  . ferric gluconate (FERRLECIT/NULECIT) IV 125 mg (10/12/18 0818)     LOS: 2 days   Time spent= 25 mins     Arsenio Loader, MD [facility]  If 7PM-7AM, please contact night-coverage [email or web address] 10/12/2018, 11:58 AM

## 2018-10-12 NOTE — Progress Notes (Signed)
  Echocardiogram 2D Echocardiogram has been performed.  Jennette Dubin 10/12/2018, 9:19 AM

## 2018-10-12 NOTE — Progress Notes (Addendum)
Daily Rounding Note  10/12/2018, 12:22 PM  LOS: 2 days   SUBJECTIVE:   Chief complaint:  Painless hematochezia.       Patient has had about 3.5 soft, brown/tan stools so far today.  There is no visible blood in any of these.  OBJECTIVE:         Vital signs in last 24 hours:    Temp:  [97.9 F (36.6 C)-98.4 F (36.9 C)] 97.9 F (36.6 C) (07/22 0804) Pulse Rate:  [81-91] 91 (07/22 0804) Resp:  [16-17] 17 (07/22 0804) BP: (127-153)/(61-66) 153/61 (07/22 0804) SpO2:  [95 %-99 %] 95 % (07/22 0804) Last BM Date: 10/12/18 Filed Weights   10/10/18 2105  Weight: 95.1 kg   General: Anxious.  Alert.  Pale. Heart: RRR. Chest: Clear bilaterally.  No labored breathing or cough. Abdomen: Obese.  Not tender.  Active bowel sounds. Rectal: Soft, unformed tan stool incontinent at the rectal area.  Patchy erythema consistent with yeast dermatitis in the peri-rectum, groin. Extremities: Anasarca in the legs. Neuro/Psych: Oriented x3.  Anxious.  Intake/Output from previous day: 07/21 0701 - 07/22 0700 In: 448.9 [P.O.:440; I.V.:8.9] Out: 1200 [Urine:1200]  Intake/Output this shift: Total I/O In: -  Out: 350 [Urine:350]  Lab Results: Recent Labs    10/11/18 0256 10/11/18 0913 10/12/18 0154  WBC 3.6* 3.2* 2.3*  HGB 7.8* 7.7* 7.0*  HCT 25.2* 24.5* 22.3*  PLT 80* 82* 63*   BMET Recent Labs    10/10/18 2127 10/11/18 0256 10/12/18 0154  NA 132* 132* 135  K 4.5 5.1 4.4  CL 105 105 108  CO2 22 19* 23  GLUCOSE 104* 123* 123*  BUN 24* 23 18  CREATININE 1.51* 1.47* 1.15*  CALCIUM 7.9* 7.8* 8.0*   LFT Recent Labs    10/10/18 2127 10/11/18 0256 10/12/18 0154  PROT 5.0* 4.9* 4.4*  ALBUMIN 2.0* 2.0* 1.8*  AST 60* 74* 48*  ALT 42 41 36  ALKPHOS 141* 141* 126  BILITOT 1.4* 1.0 1.1   PT/INR Recent Labs    10/10/18 2127  LABPROT 17.9*  INR 1.5*   Hepatitis Panel No results for input(s): HEPBSAG, HCVAB,  HEPAIGM, HEPBIGM in the last 72 hours.  Studies/Results: US Abdomen Complete  Result Date: 10/11/2018 CLINICAL DATA:  Cirrhosis EXAM: ABDOMEN ULTRASOUND COMPLETE COMPARISON:  None. FINDINGS: Gallbladder: No gallstones or wall thickening visualized. No sonographic Murphy sign noted by sonographer. Common bile duct: Diameter: 6 mm Liver: Cirrhotic liver. No focal mass or lesion identified within the liver. Portal vein is patent on color Doppler imaging with normal direction of blood flow towards the liver. IVC: No abnormality visualized. Pancreas: Obscured by overlying bowel gas. Spleen: Upper normal in size. Right Kidney: Length: 11.9 cm. Echogenicity within normal limits. No mass or hydronephrosis visualized. Left Kidney: Length: 11.4 cm. Echogenicity within normal limits. No mass or hydronephrosis visualized. Abdominal aorta: No aneurysm visualized. Mid to distal aorta obscured by overlying bowel gas. Other findings: Ascites.  RIGHT pleural effusion. IMPRESSION: 1. Cirrhotic liver.  No focal liver lesion identified. 2. Ascites. 3. RIGHT pleural effusion Electronically Signed   By: Franki Cabot M.D.   On: 10/11/2018 15:36   Scheduled Meds:  citalopram  40 mg Oral Daily   feeding supplement (ENSURE ENLIVE)  237 mL Oral BID BM   insulin aspart  0-9 Units Subcutaneous Q4H   lactulose  20 g Oral Q6H   loratadine  10 mg Oral Daily   montelukast  10 mg Oral QHS   nystatin cream  1 application Topical BID   simvastatin  40 mg Oral QHS   sodium chloride flush  3 mL Intravenous Q12H   Continuous Infusions:  sodium chloride Stopped (10/11/18 2751)   cefTRIAXone (ROCEPHIN)  IV 1 g (10/11/18 1613)   ferric gluconate (FERRLECIT/NULECIT) IV 125 mg (10/12/18 0818)   PRN Meds:.sodium chloride, acetaminophen **OR** acetaminophen, ALPRAZolam, alum & mag hydroxide-simeth, guaiFENesin-dextromethorphan, hydrALAZINE, HYDROcodone-acetaminophen, hydrocortisone, hydrocortisone cream, lip balm, loratadine,  methocarbamol, Muscle Rub, ondansetron **OR** ondansetron (ZOFRAN) IV, phenol, polyethylene glycol, polyvinyl alcohol, senna-docusate, sodium chloride, sodium chloride flush  Scheduled Meds:  citalopram  40 mg Oral Daily   feeding supplement (ENSURE ENLIVE)  237 mL Oral BID BM   hydrocortisone  25 mg Rectal BID   insulin aspart  0-9 Units Subcutaneous Q4H   lactulose  20 g Oral Q6H   loratadine  10 mg Oral Daily   montelukast  10 mg Oral QHS   nystatin cream  1 application Topical BID   simvastatin  40 mg Oral QHS   sodium chloride flush  3 mL Intravenous Q12H   Continuous Infusions:  sodium chloride Stopped (10/11/18 7001)   cefTRIAXone (ROCEPHIN)  IV 1 g (10/11/18 1613)   ferric gluconate (FERRLECIT/NULECIT) IV 125 mg (10/12/18 0818)   PRN Meds:.sodium chloride, acetaminophen **OR** acetaminophen, ALPRAZolam, alum & mag hydroxide-simeth, guaiFENesin-dextromethorphan, hydrALAZINE, HYDROcodone-acetaminophen, hydrocortisone cream, lip balm, loratadine, methocarbamol, Muscle Rub, ondansetron **OR** ondansetron (ZOFRAN) IV, phenol, polyethylene glycol, polyvinyl alcohol, senna-docusate, sodium chloride, sodium chloride flush  ASSESMENT:   *   Normocytic anemia. Low iron and low iron saturation with normal ferritin, folate and B12 levels.  Pattern consistent with anemia of chronic disease. Ferric gluconate infusion this AM.   Hgb 7.7  >> 7.    *     Chronic, intermittent hematochezia.  In setting of ?Eliquis? (for DVT prophylaxis) following orthopedic surgery. History of radiation proctitis in 2004, suspect current bleeding also from rads proctitis.   Anusol PR added 7/21  *    Eliquis in use at SNF ???.    *     Cirrhosis of the liver due to [place].  No hx of varices or portal gastropathy but we do not have the report of the most recent EGD from 2016. Ultrasound 7/21 with cirrhosis, ascites, R pleural effusion, normal PV flow.    *    Recent confusion, altered mental  status attributed to hepatic encephalopathy, ammonia 66 at the time.  Mental status improved on lactulose.   Lactulose was discontinued by the SNF 2 weeks ago due to excessive bowel movements.    Lactulose restarted and having multiple BMs.  *    Recent total knee replacement complicated by subsequent fall and fracture of her femur.  *     DM 2  *    AKI.    PLAN   *   Paracentesis?, if enough ascites to tap.    *   Anusol PR BID for 7 days.  Consider flex sig if refractory bleeding.   *   Down dose lactulose to 20 g daily.  Continue as needed Senokot.  *     Consider paracentesis with fluid studies to rule out SBP.  Not sure she has enough ascites to allow for paracentesis however.  *    There is still a question as to whether she actually was taking Eliquis, no medication records came with the patient and previous discharge summaries did not list  this as a medication.  May be best if she does not use anticoagulations unless strongly indicated.   Brittany Russo  10/12/2018, 12:22 PM Phone [phone]   Attending physician's note   I have taken an interval history, reviewed the chart and examined the patient. I agree with the Advanced Practitioner's note, impression and recommendations.   No further rectal bleeding Bright red blood per rectum likely secondary to radiation proctitis.  Continue Anusol suppository twice daily for 7 days  Multiple soft bowel movements, decrease lactulose dose with goal 3 soft daily bowel movements  Worsening peripheral edema and ascites.'s Start low-dose diuretics, Lasix 40 mg daily and Aldactone 50 mg daily. Continue to monitor BMP daily  Diagnostic paracentesis to exclude SBP   K. Denzil Magnuson , MD [phone]

## 2018-10-13 ENCOUNTER — Inpatient Hospital Stay (HOSPITAL_COMMUNITY): Payer: Medicare Other

## 2018-10-13 ENCOUNTER — Encounter (HOSPITAL_COMMUNITY): Payer: Self-pay | Admitting: Radiology

## 2018-10-13 DIAGNOSIS — R188 Other ascites: Secondary | ICD-10-CM

## 2018-10-13 DIAGNOSIS — N179 Acute kidney failure, unspecified: Secondary | ICD-10-CM

## 2018-10-13 HISTORY — PX: IR PARACENTESIS: IMG2679

## 2018-10-13 LAB — COMPREHENSIVE METABOLIC PANEL
ALT: 31 U/L (ref 0–44)
AST: 58 U/L — ABNORMAL HIGH (ref 15–41)
Albumin: 2 g/dL — ABNORMAL LOW (ref 3.5–5.0)
Alkaline Phosphatase: 134 U/L — ABNORMAL HIGH (ref 38–126)
Anion gap: 10 (ref 5–15)
BUN: 14 mg/dL (ref 8–23)
CO2: 23 mmol/L (ref 22–32)
Calcium: 8.4 mg/dL — ABNORMAL LOW (ref 8.9–10.3)
Chloride: 104 mmol/L (ref 98–111)
Creatinine, Ser: 1.08 mg/dL — ABNORMAL HIGH (ref 0.44–1.00)
GFR calc Af Amer: >60 mL/min (ref >60)
GFR calc non Af Amer: 52 mL/min — ABNORMAL LOW (ref >60)
Glucose, Bld: 148 mg/dL — ABNORMAL HIGH (ref 70–99)
Potassium: 4.8 mmol/L (ref 3.5–5.1)
Sodium: 137 mmol/L (ref 135–145)
Total Bilirubin: 0.9 mg/dL (ref 0.3–1.2)
Total Protein: 4.8 g/dL — ABNORMAL LOW (ref 6.5–8.1)

## 2018-10-13 LAB — FERRITIN: Ferritin: 246 ng/mL (ref 11–307)

## 2018-10-13 LAB — IRON AND TIBC
Iron: 76 ug/dL (ref 28–170)
Saturation Ratios: 24 % (ref 10.4–31.8)
TIBC: 315 ug/dL (ref 250–450)
UIBC: 239 ug/dL

## 2018-10-13 LAB — GRAM STAIN

## 2018-10-13 LAB — CBC
HCT: 24 % — ABNORMAL LOW (ref 36.0–46.0)
Hemoglobin: 7.4 g/dL — ABNORMAL LOW (ref 12.0–15.0)
MCH: 30.3 pg (ref 26.0–34.0)
MCHC: 30.8 g/dL (ref 30.0–36.0)
MCV: 98.4 fL (ref 80.0–100.0)
Platelets: 69 10*3/uL — ABNORMAL LOW (ref 150–400)
RBC: 2.44 MIL/uL — ABNORMAL LOW (ref 3.87–5.11)
RDW: 18.3 % — ABNORMAL HIGH (ref 11.5–15.5)
WBC: 2 10*3/uL — ABNORMAL LOW (ref 4.0–10.5)
nRBC: 0 % (ref 0.0–0.2)

## 2018-10-13 LAB — BODY FLUID CELL COUNT WITH DIFFERENTIAL
Eos, Fluid: 0 %
Lymphs, Fluid: 33 %
Monocyte-Macrophage-Serous Fluid: 65 % (ref 50–90)
Neutrophil Count, Fluid: 2 % (ref 0–25)
Total Nucleated Cell Count, Fluid: 49 cu mm (ref 0–1000)

## 2018-10-13 LAB — VITAMIN B12: Vitamin B-12: 504 pg/mL (ref 180–914)

## 2018-10-13 LAB — MAGNESIUM: Magnesium: 1.7 mg/dL (ref 1.7–2.4)

## 2018-10-13 LAB — GLUCOSE, CAPILLARY
Glucose-Capillary: 153 mg/dL — ABNORMAL HIGH (ref 70–99)
Glucose-Capillary: 138 mg/dL — ABNORMAL HIGH (ref 70–99)
Glucose-Capillary: 206 mg/dL — ABNORMAL HIGH (ref 70–99)
Glucose-Capillary: 202 mg/dL — ABNORMAL HIGH (ref 70–99)
Glucose-Capillary: 173 mg/dL — ABNORMAL HIGH (ref 70–99)

## 2018-10-13 MED ORDER — LIDOCAINE HCL 1 % IJ SOLN
INTRAMUSCULAR | Status: DC | PRN
  Administered 2018-10-13: 10 mL

## 2018-10-13 MED ORDER — LIDOCAINE HCL 1 % IJ SOLN
INTRAMUSCULAR | Status: AC
  Administered 2018-10-13: 10:00:00
  Filled 2018-10-13: qty 20

## 2018-10-13 NOTE — Procedures (Signed)
Ultrasound-guided diagnostic paracentesis performed yielding 850 cc of yellow fluid. No immediate complications. The fluid was sent to the lab for preordered studies. EBL< 1cc.

## 2018-10-13 NOTE — Care Management (Addendum)
FL2 updated with PASRR number [id number] E -Updated FL2 faxed to facility of choice   CM informed [facility] that per attending pt will be ready for discharge 7/24.  Covid test to be ordered am of 7/24

## 2018-10-13 NOTE — Progress Notes (Signed)
Educated on importance of turning and repositioning every 2 hours to maintain skin integrity. Pt does turn well independently but repositioning self every 2 hours. Was offered every 2 hrs but pt declined stating she can do it herself.

## 2018-10-13 NOTE — Progress Notes (Signed)
PROGRESS NOTE    Brittany Russo  IZT:[id number] DOB: 10/19/1945 DOA: 10/10/2018 PCP: Rosine Door   Brief Narrative:  73 year old with [name] old cirrhosis, radiation proctitis, endometrial cancer treated with hysterectomy and radiation, diabetes mellitus type 2 who recently underwent right total knee arthroplasty one 7/2 subsequently fell again dislocating this area requiring revision 7/7.  Came to the hospital for evaluation of bright red blood per rectum.  Patient was discharged previously on aspirin and Xarelto?.  Initially patient presented at [facility] rocking him and then was transferred here.  GI recommended Anusol suppository and monitoring hemoglobin levels.  Received lactulose for hepatic encephalopathy during the hospitalization.  Paracentesis performed 7/23, moderate 100 cc removed without any evidence of SBP.   Assessment & Plan:   Active Problems:   DM2 (diabetes mellitus, type 2) (HCC)   Hypertension   Hyperlipidemia   Status post total knee replacement, right   Depression   Cirrhosis (HCC)   Lower GI bleed   Anasarca   Hypoalbuminemia   AKI (acute kidney injury) (HCC)   Rectal bleeding  Bright red blood per rectum, improved Iron deficiency anemia, normocytic - Low iron saturations and ferritin level borderline low as well.  Iron supplements with bowel regimen.  Hemoglobin appears to be stable at 7.4. -GI team following-Anusol rectally recommended -Patient should eventually benefit from endoscopic evaluation.  Either inpatient or defer to outpatient gastroenterology.  Acute encephalopathy, hepatic -TSH within normal limits.  Initially ammonia levels were elevated which is trended down.  Titrate lactulose to 3-4 soft bowel movements daily  Mild urinary tract infection, present prior to admission.  POA -Patient had a Foley from outside hospital, this will be removed.  Continue IV Rocephin - Nystatin powder for skin breakdown  Cirrhosis, NALFD,  decompensated -Unknown history of varices or portal gastropathy.  Endoscopy in 2016 but unknown results at this time.  Would benefit from repeat endoscopy to further characterize this. -Right upper quadrant ultrasound- cirrhotic liver with ascites and right-sided pleural effusion. -Echocardiogram shows ejection fraction 60-65% - Paracentesis 7/23= about 800 cc removed.  No evidence of SBP  Bilateral lower extremity swelling/anasarca Hypoalbuminemia -This is secondary to underlying liver issues.  Albumin given overnight.  Anemia, unspecified Thrombocytopenia -Secondary to chronic disease-cirrhosis. - Check iron studies, B12, folate -TSH within normal limits -Platelets stable.  Essential hypertension -On hold in the setting of active bleed- on atenolol, diltiazem  Hyperlipidemia -Zocor 40 mg daily  Depression - Celexa 40 mg daily  Diabetes mellitus type 2 -Insulin sliding scale and Accu-Chek. - Home long-acting insulin on hold, on glargine 65 units daily. -Janumet and glipizide on hold.  Status post recent right knee arthroplasty -Pain control.  Holding off anticoagulation due to bleeding.  DVT prophylaxis: SCDs Code Status: Full code Family Communication: None at bedside Disposition Plan: Monitor hemoglobin for at least 24 hours.  If cleared by GI, will discharge her to skilled nursing facility tomorrow morning.  Consultants:   GI  Procedures:   None  Antimicrobials:   Rocephin   Subjective: Had a paracentesis this morning, feels drowsy.  Overall feels weak but no other complaints.  Review of Systems Otherwise negative except as per HPI, including: General = no fevers, chills, dizziness, malaise, fatigue HEENT/EYES = negative for pain, redness, loss of vision, double vision, blurred vision, loss of hearing, sore throat, hoarseness, dysphagia Cardiovascular= negative for chest pain, palpitation, murmurs, lower extremity swelling Respiratory/lungs= negative  for shortness of breath, cough, hemoptysis, wheezing, mucus production Gastrointestinal= negative  for nausea, vomiting,, abdominal pain, melena, hematemesis Genitourinary= negative for Dysuria, Hematuria, Change in Urinary Frequency MSK = Negative for arthralgia, myalgias, Back Pain, Joint swelling  Neurology= Negative for headache, seizures, numbness, tingling  Psychiatry= Negative for anxiety, depression, suicidal and homocidal ideation Allergy/Immunology= Medication/Food allergy as listed  Skin= Negative for Rash, lesions, ulcers, itching   Objective: Vitals:   10/12/18 1922 10/13/18 0305 10/13/18 0756 10/13/18 1037  BP: 138/64 (!) 145/75 (!) 149/74 (!) 144/72  Pulse: 90 90 95   Resp: 16 16 18    Temp: 98.2 F (36.8 C) 97.9 F (36.6 C) 97.7 F (36.5 C)   TempSrc: Oral Oral Oral   SpO2: 96% 99% 98%   Weight:      Height:        Intake/Output Summary (Last 24 hours) at 10/13/2018 1236 Last data filed at 10/13/2018 0943 Gross per 24 hour  Intake 903 ml  Output --  Net 903 ml   Filed Weights   10/10/18 2105  Weight: 95.1 kg    Examination:  Constitutional: NAD, calm, comfortable, morbidly obese Eyes: PERRL, lids and conjunctivae normal ENMT: Mucous membranes are moist. Posterior pharynx clear of any exudate or lesions.Normal dentition.  Neck: normal, supple, no masses, no thyromegaly Respiratory: Bibasilar crackles Cardiovascular: Regular rate and rhythm, no murmurs / rubs / gallops.  3+ bilateral lower extremity pitting edema. 2+ pedal pulses. No carotid bruits.  Abdomen: no tenderness, no masses palpated. No hepatosplenomegaly. Bowel sounds positive.  Musculoskeletal: no clubbing / cyanosis. No joint deformity upper and lower extremities. Good ROM, no contractures. Normal muscle tone.  Skin: Some superficial skin breakdown in the groin and buttock area Neurologic: CN 2-12 grossly intact. Sensation intact, DTR normal. Strength 5/5 in all 4.  Psychiatric: Normal judgment  and insight. Alert and oriented x 3. Normal mood.    Data Reviewed:   CBC: Recent Labs  Lab 10/10/18 2127 10/11/18 0256 10/11/18 0913 10/12/18 0154 10/13/18 0417  WBC 3.6* 3.6* 3.2* 2.3* 2.0*  NEUTROABS 2.2  --   --   --   --   HGB 7.7* 7.8* 7.7* 7.0* 7.4*  HCT 25.2* 25.2* 24.5* 22.3* 24.0*  MCV 99.6 97.7 97.2 97.8 98.4  PLT 87* 80* 82* 63* 69*   Basic Metabolic Panel: Recent Labs  Lab 10/10/18 2127 10/11/18 0256 10/12/18 0154 10/13/18 0417  NA 132* 132* 135 137  K 4.5 5.1 4.4 4.8  CL 105 105 108 104  CO2 22 19* 23 23  GLUCOSE 104* 123* 123* 148*  BUN 24* 23 18 14   CREATININE 1.51* 1.47* 1.15* 1.08*  CALCIUM 7.9* 7.8* 8.0* 8.4*  MG  --  1.9 1.7 1.7  PHOS  --  4.2  --   --    GFR: Estimated Creatinine Clearance: 52.1 mL/min (A) (by C-G formula based on SCr of 1.08 mg/dL (H)). Liver Function Tests: Recent Labs  Lab 10/10/18 2127 10/11/18 0256 10/12/18 0154 10/13/18 0417  AST 60* 74* 48* 58*  ALT 42 41 36 31  ALKPHOS 141* 141* 126 134*  BILITOT 1.4* 1.0 1.1 0.9  PROT 5.0* 4.9* 4.4* 4.8*  ALBUMIN 2.0* 2.0* 1.8* 2.0*   No results for input(s): LIPASE, AMYLASE in the last 168 hours. Recent Labs  Lab 10/11/18 0248 10/12/18 0154  AMMONIA 61* 26   Coagulation Profile: Recent Labs  Lab 10/10/18 2127  INR 1.5*   Cardiac Enzymes: No results for input(s): CKTOTAL, CKMB, CKMBINDEX, TROPONINI in the last 168 hours. BNP (last 3 results) No  results for input(s): PROBNP in the last 8760 hours. HbA1C: No results for input(s): HGBA1C in the last 72 hours. CBG: Recent Labs  Lab 10/12/18 1925 10/12/18 2326 10/13/18 0452 10/13/18 0755 10/13/18 1142  GLUCAP 162* 152* 153* 138* 206*   Lipid Profile: No results for input(s): CHOL, HDL, LDLCALC, TRIG, CHOLHDL, LDLDIRECT in the last 72 hours. Thyroid Function Tests: Recent Labs    10/11/18 0256  TSH 3.442   Anemia Panel: Recent Labs    10/11/18 0248 10/13/18 0831  VITAMINB12 484 504  FOLATE 11.2   --   FERRITIN 56 246  TIBC 325 315  IRON 24* 76  RETICCTPCT 4.5*  --    Sepsis Labs: No results for input(s): PROCALCITON, LATICACIDVEN in the last 168 hours.  Recent Results (from the past 240 hour(s))  SARS Coronavirus 2 (CEPHEID - Performed in [place] hospital lab), Hosp Order     Status: None   Collection Time: 10/10/18 10:02 PM   Specimen: Nasopharyngeal Swab  Result Value Ref Range Status   SARS Coronavirus 2 NEGATIVE NEGATIVE Final    Comment: (NOTE) If result is NEGATIVE SARS-CoV-2 target nucleic acids are NOT DETECTED. The SARS-CoV-2 RNA is generally detectable in upper and lower  respiratory specimens during the acute phase of infection. The lowest  concentration of SARS-CoV-2 viral copies this assay can detect is 250  copies / mL. A negative result does not preclude SARS-CoV-2 infection  and should not be used as the sole basis for treatment or other  patient management decisions.  A negative result may occur with  improper specimen collection / handling, submission of specimen other  than nasopharyngeal swab, presence of viral mutation(s) within the  areas targeted by this assay, and inadequate number of viral copies  (<250 copies / mL). A negative result must be combined with clinical  observations, patient history, and epidemiological information. If result is POSITIVE SARS-CoV-2 target nucleic acids are DETECTED. The SARS-CoV-2 RNA is generally detectable in upper and lower  respiratory specimens dur ing the acute phase of infection.  Positive  results are indicative of active infection with SARS-CoV-2.  Clinical  correlation with patient history and other diagnostic information is  necessary to determine patient infection status.  Positive results do  not rule out bacterial infection or co-infection with other viruses. If result is PRESUMPTIVE POSTIVE SARS-CoV-2 nucleic acids MAY BE PRESENT.   A presumptive positive result was obtained on the submitted  specimen  and confirmed on repeat testing.  While 2019 novel coronavirus  (SARS-CoV-2) nucleic acids may be present in the submitted sample  additional confirmatory testing may be necessary for epidemiological  and / or clinical management purposes  to differentiate between  SARS-CoV-2 and other Sarbecovirus currently known to infect humans.  If clinically indicated additional testing with an alternate test  methodology [id number]) is advised. The SARS-CoV-2 RNA is generally  detectable in upper and lower respiratory sp ecimens during the acute  phase of infection. The expected result is Negative. Fact Sheet for Patients:  StrictlyIdeas.no Fact Sheet for Healthcare Providers: BankingDealers.co.za This test is not yet approved or cleared by the [place] FDA and has been authorized for detection and/or diagnosis of SARS-CoV-2 by FDA under an Emergency Use Authorization (EUA).  This EUA will remain in effect (meaning this test can be used) for the duration of the COVID-19 declaration under Section 564(b)(1) of the Act, 21 U.S.C. section 360bbb-3(b)(1), unless the authorization is terminated or revoked sooner. Performed at [facility]  Hospital Lab, [place] [address], [place], [place] [address]          Radiology Studies: US Abdomen Complete  Result Date: 10/11/2018 CLINICAL DATA:  Cirrhosis EXAM: ABDOMEN ULTRASOUND COMPLETE COMPARISON:  None. FINDINGS: Gallbladder: No gallstones or wall thickening visualized. No sonographic Murphy sign noted by sonographer. Common bile duct: Diameter: 6 mm Liver: Cirrhotic liver. No focal mass or lesion identified within the liver. Portal vein is patent on color Doppler imaging with normal direction of blood flow towards the liver. IVC: No abnormality visualized. Pancreas: Obscured by overlying bowel gas. Spleen: Upper normal in size. Right Kidney: Length: 11.9 cm. Echogenicity within normal limits. No mass or  hydronephrosis visualized. Left Kidney: Length: 11.4 cm. Echogenicity within normal limits. No mass or hydronephrosis visualized. Abdominal aorta: No aneurysm visualized. Mid to distal aorta obscured by overlying bowel gas. Other findings: Ascites.  RIGHT pleural effusion. IMPRESSION: 1. Cirrhotic liver.  No focal liver lesion identified. 2. Ascites. 3. RIGHT pleural effusion Electronically Signed   By: Franki Cabot M.D.   On: 10/11/2018 15:36   Ir Paracentesis  Result Date: 10/13/2018 INDICATION: History of cirrhosis, endometrial cancer, hematochezia, ascites, acute kidney injury. Request made for diagnostic paracentesis. EXAM: ULTRASOUND GUIDED DIAGNOSTIC PARACENTESIS MEDICATIONS: None COMPLICATIONS: None immediate. PROCEDURE: Informed written consent was obtained from the patient after a discussion of the risks, benefits and alternatives to treatment. A timeout was performed prior to the initiation of the procedure. Initial ultrasound scanning demonstrates a small amount of ascites within the right mid to lower abdominal quadrant. The right mid to lower abdomen was prepped and draped in the usual sterile fashion. 1% lidocaine was used for local anesthesia. Following this, a 6 Fr Safe-T-Centesis catheter was introduced. An ultrasound image was saved for documentation purposes. The paracentesis was performed. The catheter was removed and a dressing was applied. The patient tolerated the procedure well without immediate post procedural complication. FINDINGS: A total of approximately 850 cc of yellow fluid was removed. Samples were sent to the laboratory as requested by the clinical team. IMPRESSION: Successful ultrasound-guided diagnostic paracentesis yielding 850 cc of peritoneal fluid. Read by: Rowe Robert, PA-C Electronically Signed   By: Sandi Mariscal M.D.   On: 10/13/2018 10:43        Scheduled Meds:  citalopram  40 mg Oral Daily   feeding supplement (ENSURE ENLIVE)  237 mL Oral BID BM    furosemide  40 mg Oral Daily   hydrocortisone  25 mg Rectal BID   insulin aspart  0-9 Units Subcutaneous Q4H   lactulose  20 g Oral Daily   lidocaine       loratadine  10 mg Oral Daily   montelukast  10 mg Oral QHS   nystatin cream  1 application Topical BID   simvastatin  40 mg Oral QHS   sodium chloride flush  3 mL Intravenous Q12H   spironolactone  50 mg Oral Daily   Continuous Infusions:  sodium chloride Stopped (10/11/18 3846)   cefTRIAXone (ROCEPHIN)  IV 1 g (10/12/18 1540)   ferric gluconate (FERRLECIT/NULECIT) IV 125 mg (10/12/18 0818)     LOS: 3 days   Time spent= 25 mins    Ashlin Kreps Arsenio Loader, MD [facility]  If 7PM-7AM, please contact night-coverage [email or web address] 10/13/2018, 12:36 PM

## 2018-10-13 NOTE — NC FL2 (Signed)
[place] MEDICAID FL2 LEVEL OF CARE SCREENING TOOL     IDENTIFICATION  Patient Name: Brittany Russo Birthdate: 09-06-1945 Sex: female Admission Date (Current Location): 10/10/2018  [facility] and [place] Number:  [employer] and Address:  [facility] [place]. [facility], [place] [address], [place], [place] [address]      Provider Number: [id number]  Attending Physician Name and Address:  Damita Lack, MD  Relative Name and Phone Number:  [phone]    Current Level of Care: Hospital Recommended Level of Care: [place] Prior Approval Number:    Date Approved/Denied:   PASRR Number: [id number] E Start Date 10/12/18  Discharge Plan: SNF    Current Diagnoses: Patient Active Problem List   Diagnosis Date Noted  . Rectal bleeding   . AKI (acute kidney injury) ([place]) 10/11/2018  . Lower GI bleed 10/10/2018  . Anasarca 10/10/2018  . Hypoalbuminemia 10/10/2018  . Depression 09/28/2018  . Acute hepatic encephalopathy 09/28/2018  . Cirrhosis ([place]) 09/28/2018  . Periprosthetic fracture around internal prosthetic right knee joint 09/24/2018  . S/P right TKA 09/22/2018  . Status post total knee replacement, right 09/22/2018  . DM2 (diabetes mellitus, type 2) ([place]) 07/11/2015  . Hypertension 07/11/2015  . Hyperlipidemia 07/11/2015    Orientation RESPIRATION BLADDER Height & Weight     Self, Time, Situation, Place  Normal Incontinent, External catheter Weight: 95.1 kg Height:  5\' 2"  (157.5 cm)  BEHAVIORAL SYMPTOMS/MOOD NEUROLOGICAL BOWEL NUTRITION STATUS      Continent Diet(see discharge summary)  AMBULATORY STATUS COMMUNICATION OF NEEDS Skin   Extensive Assist Verbally Other (Comment)(MASD perineum and groin, Eczema arms and hands)                       Personal Care Assistance Level of Assistance  Bathing, Feeding, Dressing, Total care Bathing Assistance: Limited assistance Feeding assistance: Independent Dressing Assistance:  Limited assistance Total Care Assistance: Maximum assistance   Functional Limitations Info  Sight, Hearing, Speech Sight Info: Adequate Hearing Info: Adequate Speech Info: Adequate    SPECIAL CARE FACTORS FREQUENCY  PT (By licensed PT), OT (By licensed OT)     PT Frequency: min 5x weekly OT Frequency: min 5x weekly            Contractures Contractures Info: Not present    Additional Factors Info  Code Status, Allergies Code Status Info: full Allergies Info: Asa (aspirin), any jewelry metal - hives and itching           Current Medications (10/13/2018):  This is the current hospital active medication list Current Facility-Administered Medications  Medication Dose Route Frequency Provider Last Rate Last Dose  . lidocaine (XYLOCAINE) 1 % (with pres) injection           . 0.9 %  sodium chloride infusion  250 mL Intravenous PRN Toy Baker, MD   Stopped at 10/11/18 8101  . acetaminophen (TYLENOL) tablet 650 mg  650 mg Oral Q6H PRN Toy Baker, MD   650 mg at 10/12/18 0920   Or  . acetaminophen (TYLENOL) suppository 650 mg  650 mg Rectal Q6H PRN Toy Baker, MD      . ALPRAZolam Duanne Moron) tablet 0.25 mg  0.25 mg Oral BID PRN Amin, Ankit Chirag, MD   0.25 mg at 10/13/18 0957  . alum & mag hydroxide-simeth (MAALOX/MYLANTA) 200-200-20 MG/5ML suspension 30 mL  30 mL Oral Q4H PRN Amin, Ankit Chirag, MD   30 mL at 10/12/18 0918  .  cefTRIAXone (ROCEPHIN) 1 g in sodium chloride 0.9 % 100 mL IVPB  1 g Intravenous Q24H Amin, Ankit Chirag, MD 200 mL/hr at 10/12/18 1540 1 g at 10/12/18 1540  . citalopram (CELEXA) tablet 40 mg  40 mg Oral Daily Doutova, Anastassia, MD   40 mg at 10/13/18 0946  . feeding supplement (ENSURE ENLIVE) (ENSURE ENLIVE) liquid 237 mL  237 mL Oral BID BM Amin, Ankit Chirag, MD   237 mL at 10/12/18 0813  . ferric gluconate (NULECIT) 125 mg in sodium chloride 0.9 % 100 mL IVPB  125 mg Intravenous Daily Amin, Ankit Chirag, MD 110 mL/hr at 10/12/18  0818 125 mg at 10/12/18 0818  . furosemide (LASIX) tablet 40 mg  40 mg Oral Daily [name], MD   40 mg at 10/13/18 0946  . guaiFENesin-dextromethorphan (ROBITUSSIN DM) 100-10 MG/5ML syrup 5 mL  5 mL Oral Q4H PRN Amin, Ankit Chirag, MD      . hydrALAZINE (APRESOLINE) injection 10 mg  10 mg Intravenous Q4H PRN Amin, Ankit Chirag, MD      . HYDROcodone-acetaminophen (NORCO/VICODIN) 5-325 MG per tablet 1-2 tablet  1-2 tablet Oral Q4H PRN Toy Baker, MD   2 tablet at 10/12/18 1728  . hydrocortisone (ANUSOL-HC) suppository 25 mg  25 mg Rectal BID Vena Rua, PA-C   25 mg at 10/12/18 2226  . hydrocortisone cream 1 % 1 application  1 application Topical TID PRN Amin, Ankit Chirag, MD      . insulin aspart (novoLOG) injection 0-9 Units  0-9 Units Subcutaneous Q4H Toy Baker, MD   1 Units at 10/13/18 0850  . lactulose (CHRONULAC) 10 GM/15ML solution 20 g  20 g Oral Daily Vena Rua, PA-C   20 g at 10/13/18 2703  . lip balm (CARMEX) ointment 1 application  1 application Topical PRN Amin, Ankit Chirag, MD      . loratadine (CLARITIN) tablet 10 mg  10 mg Oral Daily Doutova, Anastassia, MD   10 mg at 10/13/18 0946  . loratadine (CLARITIN) tablet 10 mg  10 mg Oral Daily PRN Amin, Ankit Chirag, MD      . methocarbamol (ROBAXIN) tablet 500 mg  500 mg Oral Q6H PRN Doutova, Anastassia, MD      . montelukast (SINGULAIR) tablet 10 mg  10 mg Oral QHS Doutova, Anastassia, MD   10 mg at 10/12/18 2218  . Muscle Rub CREA 1 application  1 application Topical PRN Amin, Ankit Chirag, MD      . nystatin cream (MYCOSTATIN) 1 application  1 application Topical BID Damita Lack, MD   1 application at 50/09/38 2231  . ondansetron (ZOFRAN) tablet 4 mg  4 mg Oral Q6H PRN Toy Baker, MD       Or  . ondansetron (ZOFRAN) injection 4 mg  4 mg Intravenous Q6H PRN Doutova, Anastassia, MD      . phenol (CHLORASEPTIC) mouth spray 1 spray  1 spray Mouth/Throat PRN Amin, Ankit Chirag, MD       . polyethylene glycol (MIRALAX / GLYCOLAX) packet 17 g  17 g Oral Daily PRN Amin, Ankit Chirag, MD      . polyvinyl alcohol (LIQUIFILM TEARS) 1.4 % ophthalmic solution 1 drop  1 drop Both Eyes PRN Amin, Ankit Chirag, MD      . simvastatin (ZOCOR) tablet 40 mg  40 mg Oral QHS Toy Baker, MD   40 mg at 10/12/18 2218  . sodium chloride (OCEAN) 0.65 % nasal spray 1 spray  1 spray Each Nare PRN Amin, Ankit Chirag, MD      . sodium chloride flush (NS) 0.9 % injection 3 mL  3 mL Intravenous Q12H Doutova, Anastassia, MD   3 mL at 10/12/18 2232  . sodium chloride flush (NS) 0.9 % injection 3 mL  3 mL Intravenous PRN Doutova, Anastassia, MD      . spironolactone (ALDACTONE) tablet 50 mg  50 mg Oral Daily Nandigam, Venia Minks, MD   50 mg at 10/13/18 4099     Discharge Medications: Please see discharge summary for a list of discharge medications.  Relevant Imaging Results:  Relevant Lab Results:   Additional Information SSN: [id number]  Maryclare Labrador, RN

## 2018-10-13 NOTE — Progress Notes (Addendum)
Daily Rounding Note  10/13/2018, 9:14 AM  LOS: 3 days   SUBJECTIVE:   Chief complaint: hematochezia, cirrhosis    No rectal bleeding >36 hours.  2 small, loose/soft/brown stools this Am Generally feels tired.    OBJECTIVE:         Vital signs in last 24 hours:    Temp:  [97.7 F (36.5 C)-98.2 F (36.8 C)] 97.7 F (36.5 C) (07/23 0756) Pulse Rate:  [90-95] 95 (07/23 0756) Resp:  [16-18] 18 (07/23 0756) BP: (138-149)/(64-75) 149/74 (07/23 0756) SpO2:  [96 %-99 %] 98 % (07/23 0756) Last BM Date: 10/12/18 Filed Weights   10/10/18 2105  Weight: 95.1 kg   General: looks pale, not well.  Lert but less engaged and less anxious than past days   Heart: RRR Chest: clear bil.   Abdomen: obese, soft, moderate distention. NT.  Active BS  Extremities: + Bil LE edema, 1 to 2+ pitting.  Knee immobilizer is off, looking at bandage and bruising on right LE for first time.  No weeping from wound.    Neuro/Psych:  Oriented x 3.  No asterixis  Intake/Output from previous day: 07/22 0701 - 07/23 0700 In: 1143 [P.O.:720; I.V.:3; IV Piggyback:420] Out: 350 [Urine:350]  Intake/Output this shift: No intake/output data recorded.  Lab Results: Recent Labs    10/11/18 0913 10/12/18 0154 10/13/18 0417  WBC 3.2* 2.3* 2.0*  HGB 7.7* 7.0* 7.4*  HCT 24.5* 22.3* 24.0*  PLT 82* 63* 69*   BMET Recent Labs    10/11/18 0256 10/12/18 0154 10/13/18 0417  NA 132* 135 137  K 5.1 4.4 4.8  CL 105 108 104  CO2 19* 23 23  GLUCOSE 123* 123* 148*  BUN 23 18 14   CREATININE 1.47* 1.15* 1.08*  CALCIUM 7.8* 8.0* 8.4*   LFT Recent Labs    10/11/18 0256 10/12/18 0154 10/13/18 0417  PROT 4.9* 4.4* 4.8*  ALBUMIN 2.0* 1.8* 2.0*  AST 74* 48* 58*  ALT 41 36 31  ALKPHOS 141* 126 134*  BILITOT 1.0 1.1 0.9   PT/INR Recent Labs    10/10/18 2127  LABPROT 17.9*  INR 1.5*   Hepatitis Panel No results for input(s): HEPBSAG, HCVAB,  HEPAIGM, HEPBIGM in the last 72 hours.  Studies/Results: US Abdomen Complete  Result Date: 10/11/2018 CLINICAL DATA:  Cirrhosis EXAM: ABDOMEN ULTRASOUND COMPLETE COMPARISON:  None. FINDINGS: Gallbladder: No gallstones or wall thickening visualized. No sonographic Murphy sign noted by sonographer. Common bile duct: Diameter: 6 mm Liver: Cirrhotic liver. No focal mass or lesion identified within the liver. Portal vein is patent on color Doppler imaging with normal direction of blood flow towards the liver. IVC: No abnormality visualized. Pancreas: Obscured by overlying bowel gas. Spleen: Upper normal in size. Right Kidney: Length: 11.9 cm. Echogenicity within normal limits. No mass or hydronephrosis visualized. Left Kidney: Length: 11.4 cm. Echogenicity within normal limits. No mass or hydronephrosis visualized. Abdominal aorta: No aneurysm visualized. Mid to distal aorta obscured by overlying bowel gas. Other findings: Ascites.  RIGHT pleural effusion. IMPRESSION: 1. Cirrhotic liver.  No focal liver lesion identified. 2. Ascites. 3. RIGHT pleural effusion Electronically Signed   By: Franki Cabot M.D.   On: 10/11/2018 15:36   Scheduled Meds: . citalopram  40 mg Oral Daily  . feeding supplement (ENSURE ENLIVE)  237 mL Oral BID BM  . furosemide  40 mg Oral Daily  . hydrocortisone  25 mg Rectal BID  . insulin  aspart  0-9 Units Subcutaneous Q4H  . lactulose  20 g Oral Daily  . loratadine  10 mg Oral Daily  . montelukast  10 mg Oral QHS  . nystatin cream  1 application Topical BID  . simvastatin  40 mg Oral QHS  . sodium chloride flush  3 mL Intravenous Q12H  . spironolactone  50 mg Oral Daily   Continuous Infusions: . sodium chloride Stopped (10/11/18 1610)  . cefTRIAXone (ROCEPHIN)  IV 1 g (10/12/18 1540)  . ferric gluconate (FERRLECIT/NULECIT) IV 125 mg (10/12/18 0818)   PRN Meds:.sodium chloride, acetaminophen **OR** acetaminophen, ALPRAZolam, alum & mag hydroxide-simeth,  guaiFENesin-dextromethorphan, hydrALAZINE, HYDROcodone-acetaminophen, hydrocortisone cream, lip balm, loratadine, methocarbamol, Muscle Rub, ondansetron **OR** ondansetron (ZOFRAN) IV, phenol, polyethylene glycol, polyvinyl alcohol, sodium chloride, sodium chloride flush   ASSESMENT:   *  Chronic , intermittent hematochezia. Likely cause is radiation proctitis.   Anusol HC PR in place.   *   [place] anemia.  Anemia of chronic dz pattern with some acute blood loss anemia.  Hgbs stable in 7s.   Parenteral iron on 7/22.  B12 normal.  Folate pndg. Iron and TIBC normal.    *   Pancytopenia.   Platelets in 42s from 80s.     *   Eliquis use at SNF (For CVT prophy)??  *   Cirrhosis, NASH.    *   Ascites Lasix 40, Aldactone 50 day 2.    *   AKI, improved.    *   HE.  On Lactulose.    *   Recent TKR, post op fall and femur fracture requiring second surgery.     PLAN   *   Paracentesis ordered for today.  Cell count to r/o SBP.    *   PT/INR, CBC, BMET in AM.    1440 Addendum 850 cc tap.   No SBP on cell count.    Azucena Freed  10/13/2018, 9:14 AM Phone [phone]   Attending physician's note   I have taken an interval history, reviewed the chart and examined the patient. I agree with the Advanced Practitioner's note, impression and recommendations.   Nash cirrhosis decompensated with ascites, volume overload and hepatic encephalopathy  Started on low-dose diuretics AKI improving Continue to monitor BMP  No further rectal bleeding, likely etiology radiation proctitis.  Continue Anusol suppository for 7 days Hemoglobin remained stable  Diagnostic paracentesis negative for SBP  Follow-up with outpatient GI on discharge  We will sign off, available if have any questions   K. Denzil Magnuson , MD [phone]

## 2018-10-13 NOTE — Consult Note (Signed)
   [facility] CM Inpatient Consult   10/13/2018  Brittany Russo 09-09-1945 [id number]   Patient reviewed for21% risk scoreof unplanned readmission, with a 30 day readmission and 3 hospitalizationsin the past 6 months; as a benefit underher Medicare/ NextGen plan.   Medical record review and MD history and physical dated 10/13/18, reveal as follows: 73 year old with [name] old cirrhosis, radiation proctitis, endometrial cancer treated with hysterectomy and radiation, diabetes mellitus type 2 who recently underwent right total knee arthroplasty on 7/2, subsequently fell again dislocating this area requiring revision on 7/7. Came to the hospital for evaluation of bright red blood per rectum. Patient was discharged previously on aspirin and Xarelto?.  Initially, patient presented at [facility] and then was transferred here ([facility]). GI recommended Anusol suppository and monitoring hemoglobin levels.  Received lactulose for hepatic encephalopathy during the hospitalization.  Paracentesis performed 7/23, moderate 100 cc removed without any evidence of SBP. (Iron deficiency anemia; acute encephalopathy, hepatic;  mild urinary tract infection, present prior to admission).   Review of medical records reveal that she isbeing recommended for skilledrehab therapies(SNF) by PT/ OT.  Transition of care team notes reviewed, stating that patient has been staying at [facility] and that patient can discharge back to [facility] once medically stable.  Pleaserefer to [place] are any changes indisposition/ needsforfollow-upas appropriate.   For questions and additional information, please call:  Kathye Cipriani A. Latecia Miler, BSN, RN-BC [facility] Liaison Cell: [phone]

## 2018-10-14 ENCOUNTER — Ambulatory Visit: Payer: Medicare Other | Admitting: Cardiology

## 2018-10-14 DIAGNOSIS — Z9181 History of falling: Secondary | ICD-10-CM | POA: Diagnosis not present

## 2018-10-14 DIAGNOSIS — D62 Acute posthemorrhagic anemia: Secondary | ICD-10-CM | POA: Diagnosis not present

## 2018-10-14 DIAGNOSIS — K7581 Nonalcoholic steatohepatitis (NASH): Secondary | ICD-10-CM | POA: Diagnosis not present

## 2018-10-14 DIAGNOSIS — K7460 Unspecified cirrhosis of liver: Secondary | ICD-10-CM | POA: Diagnosis not present

## 2018-10-14 DIAGNOSIS — R52 Pain, unspecified: Secondary | ICD-10-CM | POA: Diagnosis not present

## 2018-10-14 DIAGNOSIS — N179 Acute kidney failure, unspecified: Secondary | ICD-10-CM | POA: Diagnosis not present

## 2018-10-14 DIAGNOSIS — R41841 Cognitive communication deficit: Secondary | ICD-10-CM | POA: Diagnosis not present

## 2018-10-14 DIAGNOSIS — R350 Frequency of micturition: Secondary | ICD-10-CM | POA: Diagnosis not present

## 2018-10-14 DIAGNOSIS — M2550 Pain in unspecified joint: Secondary | ICD-10-CM | POA: Diagnosis not present

## 2018-10-14 DIAGNOSIS — Z4789 Encounter for other orthopedic aftercare: Secondary | ICD-10-CM | POA: Diagnosis not present

## 2018-10-14 DIAGNOSIS — E871 Hypo-osmolality and hyponatremia: Secondary | ICD-10-CM | POA: Diagnosis not present

## 2018-10-14 DIAGNOSIS — Z471 Aftercare following joint replacement surgery: Secondary | ICD-10-CM | POA: Diagnosis not present

## 2018-10-14 DIAGNOSIS — M6281 Muscle weakness (generalized): Secondary | ICD-10-CM | POA: Diagnosis not present

## 2018-10-14 DIAGNOSIS — K921 Melena: Secondary | ICD-10-CM | POA: Diagnosis not present

## 2018-10-14 DIAGNOSIS — Z7401 Bed confinement status: Secondary | ICD-10-CM | POA: Diagnosis not present

## 2018-10-14 DIAGNOSIS — S72491D Other fracture of lower end of right femur, subsequent encounter for closed fracture with routine healing: Secondary | ICD-10-CM | POA: Diagnosis not present

## 2018-10-14 DIAGNOSIS — K922 Gastrointestinal hemorrhage, unspecified: Secondary | ICD-10-CM | POA: Diagnosis not present

## 2018-10-14 DIAGNOSIS — R5381 Other malaise: Secondary | ICD-10-CM | POA: Diagnosis not present

## 2018-10-14 DIAGNOSIS — R110 Nausea: Secondary | ICD-10-CM | POA: Diagnosis not present

## 2018-10-14 DIAGNOSIS — W19XXXA Unspecified fall, initial encounter: Secondary | ICD-10-CM | POA: Diagnosis not present

## 2018-10-14 DIAGNOSIS — E119 Type 2 diabetes mellitus without complications: Secondary | ICD-10-CM | POA: Diagnosis not present

## 2018-10-14 DIAGNOSIS — Z96651 Presence of right artificial knee joint: Secondary | ICD-10-CM | POA: Diagnosis not present

## 2018-10-14 DIAGNOSIS — F419 Anxiety disorder, unspecified: Secondary | ICD-10-CM | POA: Diagnosis not present

## 2018-10-14 DIAGNOSIS — K7290 Hepatic failure, unspecified without coma: Secondary | ICD-10-CM | POA: Diagnosis not present

## 2018-10-14 DIAGNOSIS — Z1159 Encounter for screening for other viral diseases: Secondary | ICD-10-CM | POA: Diagnosis not present

## 2018-10-14 DIAGNOSIS — R2689 Other abnormalities of gait and mobility: Secondary | ICD-10-CM | POA: Diagnosis not present

## 2018-10-14 DIAGNOSIS — E8809 Other disorders of plasma-protein metabolism, not elsewhere classified: Secondary | ICD-10-CM | POA: Diagnosis not present

## 2018-10-14 DIAGNOSIS — F339 Major depressive disorder, recurrent, unspecified: Secondary | ICD-10-CM | POA: Diagnosis not present

## 2018-10-14 DIAGNOSIS — N390 Urinary tract infection, site not specified: Secondary | ICD-10-CM | POA: Diagnosis not present

## 2018-10-14 DIAGNOSIS — E1165 Type 2 diabetes mellitus with hyperglycemia: Secondary | ICD-10-CM | POA: Diagnosis not present

## 2018-10-14 DIAGNOSIS — I10 Essential (primary) hypertension: Secondary | ICD-10-CM | POA: Diagnosis not present

## 2018-10-14 DIAGNOSIS — M25561 Pain in right knee: Secondary | ICD-10-CM | POA: Diagnosis not present

## 2018-10-14 DIAGNOSIS — R188 Other ascites: Secondary | ICD-10-CM | POA: Diagnosis not present

## 2018-10-14 DIAGNOSIS — K625 Hemorrhage of anus and rectum: Secondary | ICD-10-CM | POA: Diagnosis not present

## 2018-10-14 DIAGNOSIS — R601 Generalized edema: Secondary | ICD-10-CM | POA: Diagnosis not present

## 2018-10-14 DIAGNOSIS — T84012D Broken internal right knee prosthesis, subsequent encounter: Secondary | ICD-10-CM | POA: Diagnosis not present

## 2018-10-14 LAB — COMPREHENSIVE METABOLIC PANEL
ALT: 32 U/L (ref 0–44)
AST: 43 U/L — ABNORMAL HIGH (ref 15–41)
Albumin: 2 g/dL — ABNORMAL LOW (ref 3.5–5.0)
Alkaline Phosphatase: 128 U/L — ABNORMAL HIGH (ref 38–126)
Anion gap: 8 (ref 5–15)
BUN: 10 mg/dL (ref 8–23)
CO2: 24 mmol/L (ref 22–32)
Calcium: 8.2 mg/dL — ABNORMAL LOW (ref 8.9–10.3)
Chloride: 104 mmol/L (ref 98–111)
Creatinine, Ser: 0.91 mg/dL (ref 0.44–1.00)
GFR calc Af Amer: >60 mL/min (ref >60)
GFR calc non Af Amer: >60 mL/min (ref >60)
Glucose, Bld: 145 mg/dL — ABNORMAL HIGH (ref 70–99)
Potassium: 4.3 mmol/L (ref 3.5–5.1)
Sodium: 136 mmol/L (ref 135–145)
Total Bilirubin: 1 mg/dL (ref 0.3–1.2)
Total Protein: 4.9 g/dL — ABNORMAL LOW (ref 6.5–8.1)

## 2018-10-14 LAB — CBC
HCT: 23 % — ABNORMAL LOW (ref 36.0–46.0)
Hemoglobin: 7.2 g/dL — ABNORMAL LOW (ref 12.0–15.0)
MCH: 30.6 pg (ref 26.0–34.0)
MCHC: 31.3 g/dL (ref 30.0–36.0)
MCV: 97.9 fL (ref 80.0–100.0)
Platelets: 63 10*3/uL — ABNORMAL LOW (ref 150–400)
RBC: 2.35 MIL/uL — ABNORMAL LOW (ref 3.87–5.11)
RDW: 18.1 % — ABNORMAL HIGH (ref 11.5–15.5)
WBC: 1.7 10*3/uL — ABNORMAL LOW (ref 4.0–10.5)
nRBC: 0 % (ref 0.0–0.2)

## 2018-10-14 LAB — PROTIME-INR
INR: 1.5 — ABNORMAL HIGH (ref 0.8–1.2)
Prothrombin Time: 18 seconds — ABNORMAL HIGH (ref 11.4–15.2)

## 2018-10-14 LAB — PATHOLOGIST SMEAR REVIEW

## 2018-10-14 LAB — GLUCOSE, CAPILLARY
Glucose-Capillary: 132 mg/dL — ABNORMAL HIGH (ref 70–99)
Glucose-Capillary: 142 mg/dL — ABNORMAL HIGH (ref 70–99)
Glucose-Capillary: 158 mg/dL — ABNORMAL HIGH (ref 70–99)
Glucose-Capillary: 201 mg/dL — ABNORMAL HIGH (ref 70–99)

## 2018-10-14 LAB — SARS CORONAVIRUS 2 BY RT PCR (HOSPITAL ORDER, PERFORMED IN ~~LOC~~ HOSPITAL LAB): SARS Coronavirus 2: NEGATIVE

## 2018-10-14 LAB — HEMOGLOBIN AND HEMATOCRIT, BLOOD
HCT: 29 % — ABNORMAL LOW (ref 36.0–46.0)
Hemoglobin: 9.2 g/dL — ABNORMAL LOW (ref 12.0–15.0)

## 2018-10-14 LAB — FOLATE RBC
Folate, Hemolysate: 340 ng/mL
Folate, RBC: 1435 ng/mL (ref >498)
Hematocrit: 23.7 % — ABNORMAL LOW (ref 34.0–46.6)

## 2018-10-14 LAB — PREPARE RBC (CROSSMATCH)

## 2018-10-14 LAB — MAGNESIUM: Magnesium: 1.5 mg/dL — ABNORMAL LOW (ref 1.7–2.4)

## 2018-10-14 MED ORDER — INSULIN ASPART 100 UNIT/ML ~~LOC~~ SOLN
0.0000 [IU] | Freq: Three times a day (TID) | SUBCUTANEOUS | Status: DC
  Administered 2018-10-14: 3 [IU] via SUBCUTANEOUS
  Administered 2018-10-14: 2 [IU] via SUBCUTANEOUS

## 2018-10-14 MED ORDER — METHOCARBAMOL 500 MG PO TABS: 500.0000 mg | ORAL_TABLET | Freq: Four times a day (QID) | ORAL | 0 refills | Status: AC | PRN

## 2018-10-14 MED ORDER — HYDROCORTISONE ACETATE 25 MG RE SUPP: 25.0000 mg | Freq: Two times a day (BID) | RECTAL | 0 refills | Status: AC

## 2018-10-14 MED ORDER — SPIRONOLACTONE 50 MG PO TABS: 50.0000 mg | ORAL_TABLET | Freq: Every day | ORAL | 0 refills | Status: DC

## 2018-10-14 MED ORDER — FUROSEMIDE 40 MG PO TABS: 40.0000 mg | ORAL_TABLET | Freq: Every day | ORAL | 0 refills | Status: DC

## 2018-10-14 MED ORDER — ALPRAZOLAM 0.25 MG PO TABS: 0.2500 mg | ORAL_TABLET | Freq: Two times a day (BID) | ORAL | 0 refills | Status: AC | PRN

## 2018-10-14 MED ORDER — FLUCONAZOLE 150 MG PO TABS
150.0000 mg | ORAL_TABLET | ORAL | Status: AC
  Administered 2018-10-14: 150 mg via ORAL
  Filled 2018-10-14: qty 1

## 2018-10-14 MED ORDER — MAGNESIUM SULFATE 2 GM/50ML IV SOLN
2.0000 g | Freq: Once | INTRAVENOUS | Status: AC
  Administered 2018-10-14: 2 g via INTRAVENOUS
  Filled 2018-10-14: qty 50

## 2018-10-14 MED ORDER — SODIUM CHLORIDE 0.9% IV SOLUTION
Freq: Once | INTRAVENOUS | Status: AC
  Administered 2018-10-14: 08:00:00 via INTRAVENOUS

## 2018-10-14 MED ORDER — FLUCONAZOLE 100 MG PO TABS: 100.0000 mg | ORAL_TABLET | Freq: Every day | ORAL | 0 refills | Status: AC

## 2018-10-14 MED ORDER — HYDROCODONE-ACETAMINOPHEN 5-325 MG PO TABS: 1.0000 | ORAL_TABLET | Freq: Four times a day (QID) | ORAL | 0 refills | Status: AC | PRN

## 2018-10-14 NOTE — Progress Notes (Signed)
Physical Therapy Treatment Patient Details Name: Brittany Russo MRN: [id number] DOB: [birth year] Today's Date: 10/14/2018    History of Present Illness Pt is a 73 y.o. female admitted 10/10/18 with low hemoglobin and abdominal discomfort; suspect lower GI source. Of note, recent admission 09/22/18 for R TKA with subsequent fall resulting in readmission with R distal femur fx s/p TKA repair; pt then d/c to SNF 09/30/18. PMH includes HTN, DM2, obesity.    PT Comments    Pt performed stand pivot transfer to and from commode with +2 max assistance.  Pt extremely fearful of falling and required constant reassurance she was working in a safe environment.  Based on need for +2 max assistance she continues to benefit from skilled rehab at SNF to improve strength and function before returning home.    Follow Up Recommendations  SNF;Supervision/Assistance - 24 hour     Equipment Recommendations  None recommended by PT    Recommendations for Other Services       Precautions / Restrictions Precautions Precautions: Fall;Knee Precaution Comments: No ROM to R knee Required Braces or Orthoses: Knee Immobilizer - Right Knee Immobilizer - Right: On at all times Restrictions Weight Bearing Restrictions: No RLE Weight Bearing: Weight bearing as tolerated Other Position/Activity Restrictions: Per PA via phone call on 10/14/2018 KI on at all times and no ROM to R knee.  Okay to encourage knee extension for brace application and repostioning of brace.    Mobility  Bed Mobility Overal bed mobility: Needs Assistance Bed Mobility: Rolling Rolling: Min guard   Supine to sit: HOB elevated;Mod assist Sit to supine: +2 for physical assistance;Max assist   General bed mobility comments: Mod A sit at EOB +2 for safety. Good use of UE with Bed rail to elevate trunk to sit at EOB.   Required increased assistance to return to bed based on innability to lift limbs against gravity.  Once in bed rolling performed to  position pads.  Pt placed in slight trendelenberg to boost to [facility]  Transfers Overall transfer level: Needs assistance Equipment used: Rolling walker (2 wheeled) Transfers: Stand Pivot Transfers;Sit to/from Stand Sit to Stand: Max assist;+2 physical assistance Stand pivot transfers: Max assist;+2 physical assistance       General transfer comment: Improved stand pivot transfer. PT support L knee to facilitate pivot towards L side to [facility].   To return back to bed performed sit to stand FACE to face blocking L knee and pivoting hips back to bed.  Ambulation/Gait Ambulation/Gait assistance: (Remains unable, performed shuffling pivot from bed to rBSC and back.)               Stairs             Wheelchair Mobility    Modified Rankin (Stroke Patients Only)       Balance Overall balance assessment: Needs assistance Sitting-balance support: No upper extremity supported;Feet supported Sitting balance-Leahy Scale: Fair   Postural control: Posterior lean Standing balance support: Bilateral upper extremity supported Standing balance-Leahy Scale: Poor Standing balance comment: Heavy reliance on BUE support                            Cognition Arousal/Alertness: Awake/alert Behavior During Therapy: WFL for tasks assessed/performed;Anxious Overall Cognitive Status: No family/caregiver present to determine baseline cognitive functioning Area of Impairment: Attention;Problem solving;Following commands                   Current  Attention Level: Selective Memory: Decreased short-term memory;Decreased recall of precautions Following Commands: Follows multi-step commands inconsistently;Follows multi-step commands with increased time     Problem Solving: Requires verbal cues;Difficulty sequencing General Comments: Increased anxiety remains with strong fear of falling.      Exercises      General Comments        Pertinent Vitals/Pain Pain Assessment:  Faces Faces Pain Scale: Hurts even more Pain Location: RLE Pain Descriptors / Indicators: Sore;Discomfort Pain Intervention(s): Monitored during session;Repositioned    Home Living                      Prior Function            PT Goals (current goals can now be found in the care plan section) Acute Rehab PT Goals Patient Stated Goal: be able to walk Potential to Achieve Goals: Fair Progress towards PT goals: Progressing toward goals    Frequency    Min 3X/week      PT Plan Current plan remains appropriate    Co-evaluation PT/OT/SLP Co-Evaluation/Treatment: Yes Reason for Co-Treatment: Complexity of the patient's impairments (multi-system involvement);For patient/therapist safety;To address functional/ADL transfers PT goals addressed during session: Mobility/safety with mobility OT goals addressed during session: ADL's and self-care      AM-PAC PT "6 Clicks" Mobility   Outcome Measure  Help needed turning from your back to your side while in a flat bed without using bedrails?: A Lot Help needed moving from lying on your back to sitting on the side of a flat bed without using bedrails?: A Lot Help needed moving to and from a bed to a chair (including a wheelchair)?: Total Help needed standing up from a chair using your arms (e.g., wheelchair or bedside chair)?: Total Help needed to walk in hospital room?: Total   6 Click Score: 7    End of Session Equipment Utilized During Treatment: Gait belt;Right knee immobilizer Activity Tolerance: Treatment limited secondary to medical complications (Comment) Patient left: in bed;with call bell/phone within reach;with bed alarm set Nurse Communication: Mobility status;Need for lift equipment PT Visit Diagnosis: Other abnormalities of gait and mobility (R26.89);Muscle weakness (generalized) (M62.81) Pain - Right/Left: Right Pain - part of body: Knee;Leg     Time: 2409-7353 PT Time Calculation (min) (ACUTE ONLY):  28 min  Charges:  $Therapeutic Activity: 8-22 mins                     Governor Rooks, PTA Acute Rehabilitation Services Pager [phone] Office [phone]     Daden Mahany Eli Hose 10/14/2018, 5:02 PM

## 2018-10-14 NOTE — Progress Notes (Signed)
Pt alert, able to make needs known. Pt receiving 1 unit PRBCs., no adverse reaction noted. Rapid Covid completed. Continue to monitor

## 2018-10-14 NOTE — TOC Transition Note (Addendum)
Transition of Care [facility]) - CM/SW Discharge Note   Patient Details  Name: Brittany Russo MRN: [id number] Date of Birth: 29-Aug-1945  Transition of Care [facility]) CM/SW Contact:  Maryclare Labrador, RN Phone Number: 10/14/2018, 2:23 PM   Clinical Narrative:   CM confirmed with [facility] that pt does in fact have a bed today.  COVID test resulted negative today.  CM faxed discharge summary to facility - CM awaiting confirmation of d/c summary and report specifics  Update 1500:  [facility] confirms they have received discharge summary and will be able to accept pt today.  Covid test now resulted negative - agency aware.  Per bedside nurse pt will be done with today's ordered interventions shortly and bedside nurse request for CM to arrange transport via [facility] for 1600 - arrangements made.  Transport pkg printed to [employer] and bedside nurse aware.  CM informed both pt and husband of transport time  Pts room number at facility is 154 Bed 2 Report to be called to RN Leana Roe [phone]     Barriers to Discharge: Continued Medical Work up   Patient Goals and CMS Choice Patient states their goals for this hospitalization and ongoing recovery are:: to go back to [facility] SN[facility] CMS Medicare.gov Compare Post Acute Care list provided to:: Patient Choice offered to / list presented to : Patient  Discharge Placement                       Discharge Plan and Services     Post Acute Care Choice: [facility]                               Social Determinants of Health (SDOH) Interventions     Readmission Risk Interventions No flowsheet data found.

## 2018-10-14 NOTE — Progress Notes (Signed)
Occupational Therapy Treatment Patient Details Name: Brittany Russo MRN: [id number] DOB: 12-13-1945 Today's Date: 10/14/2018    History of present illness Pt is a 73 y.o. female admitted 10/10/18 with low hemoglobin and abdominal discomfort; suspect lower GI source. Of note, recent admission 09/22/18 for R TKA with subsequent fall resulting in readmission with R distal femur fx s/p TKA repair; pt then d/c to SNF 09/30/18. PMH includes HTN, DM2, obesity.   OT comments  Pt slowly progressing toward OT goals. Presents with some complaints of pain, yet still fearful of falling. Session involved functional transfer to [facility] with Max A+2 for stand pivot with RW. Grooming while seated on BSC with set up. Major improvement in functional transfer, still limitation due to pain, however, pt tolerated session well. DC and freq remains the same. OT will continue to follow acutely.    Follow Up Recommendations  SNF;Supervision/Assistance - 24 hour    Equipment Recommendations  3 in 1 bedside commode    Recommendations for Other Services      Precautions / Restrictions Precautions Precautions: Fall;Knee Precaution Comments: No ROM to R knee Required Braces or Orthoses: Knee Immobilizer - Right Knee Immobilizer - Right: On at all times Restrictions Weight Bearing Restrictions: No RLE Weight Bearing: Weight bearing as tolerated Other Position/Activity Restrictions: No orders- per chart admission 1 wk ago, pt with RLE WBAT, R knee immobilizer on at all times, no R knee ROM       Mobility Bed Mobility Overal bed mobility: Needs Assistance Bed Mobility: Rolling Rolling: Min guard   Supine to sit: HOB elevated;Mod assist Sit to supine: +2 for physical assistance;Max assist   General bed mobility comments: Mod A sit at EOB +2 for safety. Good use of UE with Bed rail to elevate trunk to sit at EOB.   Transfers Overall transfer level: Needs assistance Equipment used: Rolling walker (2  wheeled) Transfers: Stand Pivot Transfers Sit to Stand: Max assist;+2 physical assistance         General transfer comment: Improved stand pivot transfer. PT support L knee to facilitate pivot towards L side to [facility].     Balance Overall balance assessment: Needs assistance Sitting-balance support: No upper extremity supported;Feet supported Sitting balance-Leahy Scale: Fair   Postural control: Posterior lean Standing balance support: Bilateral upper extremity supported Standing balance-Leahy Scale: Poor Standing balance comment: Heavy reliance on BUE support                           ADL either performed or assessed with clinical judgement   ADL Overall ADL's : Needs assistance/impaired     Grooming: Set up;Wash/dry face;Brushing hair;Sitting Grooming Details (indicate cue type and reason): Pt seated on BSC with set up for grooming tasks.     Lower Body Bathing: Maximal assistance Lower Body Bathing Details (indicate cue type and reason): Max A in standing with RW +2.         Toilet Transfer: Maximal assistance;+2 for physical assistance;+2 for safety/equipment;Stand-pivot;Cueing for safety;Cueing for sequencing Toilet Transfer Details (indicate cue type and reason): Pt more successful in [facility] transfer this session with +2 assistance. Pt required assist to power up to stand for stand pivot to [facility]           General ADL Comments: Pt demonstrated improved functional transfer however still requires Max A +2 for safety. Inability to static/dynamic stand for long period of time.      Vision  Perception     Praxis      Cognition Arousal/Alertness: Awake/alert Behavior During Therapy: WFL for tasks assessed/performed;Anxious Overall Cognitive Status: No family/caregiver present to determine baseline cognitive functioning Area of Impairment: Attention;Problem solving;Following commands                   Current Attention Level:  Selective Memory: Decreased short-term memory;Decreased recall of precautions Following Commands: Follows multi-step commands inconsistently;Follows multi-step commands with increased time     Problem Solving: Requires verbal cues;Difficulty sequencing General Comments: increased anxiety. Pt reports she takes xanax but has not been receiving it here in the hospital.        Exercises     Shoulder Instructions       General Comments      Pertinent Vitals/ Pain       Pain Assessment: Faces Faces Pain Scale: Hurts even more Pain Location: RLE Pain Descriptors / Indicators: Sore;Discomfort Pain Intervention(s): Monitored during session;Repositioned;Limited activity within patient's tolerance  Home Living                                          Prior Functioning/Environment              Frequency  Min 2X/week        Progress Toward Goals  OT Goals(current goals can now be found in the care plan section)  Progress towards OT goals: Progressing toward goals  Acute Rehab OT Goals Patient Stated Goal: be able to walk OT Goal Formulation: With patient Time For Goal Achievement: 10/25/18 Potential to Achieve Goals: Good ADL Goals Pt Will Perform Grooming: with set-up;sitting Pt Will Perform Upper Body Dressing: Independently;sitting Pt Will Transfer to Toilet: with mod assist;stand pivot transfer;bedside commode  Plan Discharge plan remains appropriate;Frequency remains appropriate    Co-evaluation    PT/OT/SLP Co-Evaluation/Treatment: Yes Reason for Co-Treatment: For patient/therapist safety;To address functional/ADL transfers   OT goals addressed during session: ADL's and self-care;Proper use of [employer]ptive equipment and DME      AM-PAC OT "6 Clicks" Daily Activity     Outcome Measure   Help from another person eating meals?: A Little Help from another person taking care of personal grooming?: A Lot Help from another person toileting,  which includes using toliet, bedpan, or urinal?: A Lot Help from another person bathing (including washing, rinsing, drying)?: A Lot Help from another person to put on and taking off regular upper body clothing?: A Little Help from another person to put on and taking off regular lower body clothing?: A Lot 6 Click Score: 14    End of Session Equipment Utilized During Treatment: Gait belt;Rolling walker  OT Visit Diagnosis: Unsteadiness on feet (R26.81);Muscle weakness (generalized) (M62.81);Pain Pain - Right/Left: Right   Activity Tolerance Patient limited by pain   Patient Left in bed;with call bell/phone within reach   Nurse Communication Mobility status;Need for lift equipment        Time: 1423-1450 OT Time Calculation (min): 27 min  Charges: OT General Charges $OT Visit: 1 Visit OT Treatments $Self Care/Home Management : 8-22 mins  Minus Breeding, MSOT, OTR/L  Supplemental Rehabilitation Services  [phone]   Marius Ditch 10/14/2018, 3:33 PM

## 2018-10-14 NOTE — Care Management Important Message (Signed)
Important Message  Patient Details  Name: Brittany Russo MRN: [id number] Date of Birth: 03/29/1945   Medicare Important Message Given:  Yes     Memory Argue 10/14/2018, 1:42 PM

## 2018-10-14 NOTE — Discharge Summary (Signed)
Physician Discharge Summary  AMAIRANI SHUEY XBD:[id number] DOB: 1945-08-19 DOA: 10/10/2018  PCP: Rosalee Kaufman, PA-C  Admit date: 10/10/2018 Discharge date: 10/14/2018  Admitted From: Skilled nursing facility Disposition: Skilled nursing facility  Recommendations for Outpatient Follow-up:  1. Follow up with PCP in 1-2 weeks 2. Please obtain BMP/CBC in one week your next doctors visit.  3. Lasix 40 mg daily, Aldactone 50 mg daily. To allow room for diuretic adjustment, discontinued Candesartan. 4. Recommend outpatient follow-up with gastroenterology, patient will need outpatient colonoscopy and endoscopy.  Endoscopy is mostly for screening evaluation for esophageal varices 5. Rectal Anusol for 7 days 6. Diflucan orally for 7 days  Discharge Condition: Stable CODE STATUS: Full code Diet recommendation: Heart healthy/2 g salt  Brief/Interim Summary: 73 year old with [name] old cirrhosis, radiation proctitis, endometrial cancer treated with hysterectomy and radiation, diabetes mellitus type 2 who recently underwent right total knee arthroplasty one 7/2 subsequently fell again dislocating this area requiring revision 7/7.  Came to the hospital for evaluation of bright red blood per rectum.  Patient was discharged previously on aspirin and Xarelto?.  73 year old with Karlene and then was transferred here.  GI recommended Anusol suppository and monitoring hemoglobin levels.  Received lactulose for hepatic encephalopathy during the hospitalization.  Paracentesis performed 7/23, 800 cc removed without any evidence of SBP.  Hemoglobin remained around 7.2 at the time of hospitalization but was given 1 unit PRBC due to symptomatic anemia.  No further inpatient intervention was recommended by gastroenterology and recommended using rectal Anusol  Discharge Diagnoses:  Active Problems:   DM2 (diabetes mellitus, type 2) (HCC)   Hypertension   Hyperlipidemia   Status post total  knee replacement, right   Depression   Cirrhosis (HCC)   Lower GI bleed   Anasarca   Hypoalbuminemia   AKI (acute kidney injury) (HCC)   Rectal bleeding   Ascites  Bright red blood per rectum, improved Iron deficiency anemia, normocytic.  Symptomatic - Low iron saturations and ferritin level borderline low as well.  Iron supplements with bowel regimen.  Hemoglobin appears to be stable at 7.2. -GI team following-Anusol rectally recommended, 7-day prescription given -We will give 1 unit of PRBC transfusion prior to discharge.  No obvious evidence of bleeding. -Would benefit from outpatient endoscopic evaluation needs endoscopy for screening of esophageal varices  Acute encephalopathy, hepatic, resolved -TSH within normal limits.  Initially ammonia levels were elevated which is trended down.  Titrate lactulose to 3-4 soft bowel movements daily  Mild urinary tract infection, present prior to admission.  POA Fungal infection in the groin, likely candidal -Completed 3-day of antibiotic course.  Foley catheter was changed in the hospital. -  Not much improvement with nystatin powder therefore 7 days of oral Diflucan prescribed.  Cirrhosis, NALFD, decompensated -Unknown history of varices or portal gastropathy.  Endoscopy in 2016 but unknown results at this time.  Would benefit from repeat outpatient endoscopy to further characterize this. -Right upper quadrant ultrasound- cirrhotic liver with ascites and right-sided pleural effusion. -Echocardiogram shows ejection fraction 60-65% - Paracentesis 7/23= about 800 cc removed.  No evidence of SBP -Started Aldactone and Lasix.  Discontinued candesartan to allow room for diuresis -2 g salt diet  Bilateral lower extremity swelling/anasarca Hypoalbuminemia -Secondary to low albumin.  Advised 2 g salt diet, fluid restriction and encourage oral diet.  Some albumin was given during admission.  Anemia, unspecified Thrombocytopenia -Secondary  to chronic disease-cirrhosis. - iron studies, B12, folate- normal -TSH within normal  limits -Platelets stable.  Essential hypertension -On hold in the setting of active bleed- on atenolol, diltiazem  Hyperlipidemia -Zocor 40 mg daily  Depression - Celexa 40 mg daily  Diabetes mellitus type 2 -Insulin sliding scale and Accu-Chek. -  Resume home meds  Status post recent right knee arthroplasty -Pain control.    Resume medications  Consultations:  GI  Subjective:   Discharge Exam: Vitals:   10/14/18 0916 10/14/18 1040  BP: 135/60   Pulse: 88 87  Resp: 16   Temp: 98.3 F (36.8 C) 98.4 F (36.9 C)  SpO2: 97% 100%   Vitals:   10/13/18 2013 10/14/18 0439 10/14/18 0916 10/14/18 1040  BP: (!) 141/60 (!) 141/56 135/60   Pulse: 95 92 88 87  Resp: 15 18 16    Temp: 98 F (36.7 C) 98.1 F (36.7 C) 98.3 F (36.8 C) 98.4 F (36.9 C)  TempSrc: Oral Oral Oral Oral  SpO2: 98% 93% 97% 100%  Weight:      Height:        General: Pt is alert, awake, not in acute distress Cardiovascular: RRR, S1/S2 +, no rubs, no gallops Respiratory: CTA bilaterally, no wheezing, no rhonchi Abdominal: Soft, NT, ND, bowel sounds + Extremities: 3+ bilateral lower extremity pitting edema, no cyanosis  Discharge Instructions  Discharge Instructions    Call MD for:  persistant nausea and vomiting   Complete by: As directed    Call MD for:  redness, tenderness, or signs of infection (pain, swelling, redness, odor or green/yellow discharge around incision site)   Complete by: As directed    Call MD for:  temperature >100.4   Complete by: As directed    Diet - low sodium heart healthy   Complete by: As directed    Discharge instructions   Complete by: As directed    You were cared for by a hospitalist during your hospital stay. If you have any questions about your discharge medications or the care you received while you were in the hospital after you are discharged, you can call the  unit and asked to speak with the hospitalist on call if the hospitalist that took care of you is not available. Once you are discharged, your primary care physician will handle any further medical issues. Please note that NO REFILLS for any discharge medications will be authorized once you are discharged, as it is imperative that you return to your primary care physician (or establish a relationship with a primary care physician if you do not have one) for your aftercare needs so that they can reassess your need for medications and monitor your lab values.  Please request your Prim.MD to go over all Hospital Tests and Procedure/Radiological results at the follow up, please get all Hospital records sent to your Prim MD by signing hospital release before you go home.  Get CBC, CMP, 2 view Chest X ray checked  by Primary MD during your next visit or SNF MD in 5-7 days ( we routinely change or add medications that can affect your baseline labs and fluid status, therefore we recommend that you get the mentioned basic workup next visit with your PCP, your PCP may decide not to get them or add new tests based on their clinical decision)  On your next visit with your primary care physician please Get Medicines reviewed and adjusted.  If you experience worsening of your admission symptoms, develop shortness of breath, life threatening emergency, suicidal or homicidal thoughts you must seek medical  attention immediately by calling 911 or calling your MD immediately  if symptoms less severe.  You Must read complete instructions/literature along with all the possible adverse reactions/side effects for all the Medicines you take and that have been prescribed to you. Take any new Medicines after you have completely understood and accpet all the possible adverse reactions/side effects.   Do not drive, operate heavy machinery, perform activities at heights, swimming or participation in water activities or provide baby  sitting services if your were admitted for syncope or siezures until you have seen by Primary MD or a Neurologist and advised to do so again.  Do not drive when taking Pain medications.   Increase activity slowly   Complete by: As directed      Allergies as of 10/14/2018      Reactions   Asa [aspirin] Other (See Comments)   Pt not allergic but cannot take due to bleeding    Other Other (See Comments)   Any jewelry metal-hives and itching       Medication List    STOP taking these medications   candesartan 16 MG tablet Commonly known as: ATACAND   cephALEXin 500 MG capsule Commonly known as: [place] these medications   ALPRAZolam 0.25 MG tablet Commonly known as: XANAX Take 1 tablet (0.25 mg total) by mouth 2 (two) times daily as needed for up to 3 days for anxiety.   aspirin 81 MG chewable tablet Chew 1 tablet (81 mg total) by mouth 2 (two) times a day for 28 days.   atenolol 50 MG tablet Commonly known as: TENORMIN Take 50 mg by mouth daily.   Basaglar KwikPen 100 UNIT/ML Sopn Inject 65 Units into the skin daily.   Cartia XT 180 MG 24 hr capsule Generic drug: diltiazem Take 180 mg by mouth daily.   celecoxib 200 MG capsule Commonly known as: CELEBREX Take 1 capsule (200 mg total) by mouth 2 (two) times daily.   citalopram 40 MG tablet Commonly known as: CELEXA Take 40 mg by mouth daily.   ferrous sulfate 325 (65 FE) MG tablet Take 1 tablet (325 mg total) by mouth 2 (two) times daily with a meal for 14 days.   fluconazole 100 MG tablet Commonly known as: Diflucan Take 1 tablet (100 mg total) by mouth daily for 7 days.   fluticasone 50 MCG/ACT nasal spray Commonly known as: FLONASE Place 1 spray into both nostrils daily.   furosemide 40 MG tablet Commonly known as: LASIX Take 1 tablet (40 mg total) by mouth daily.   glipiZIDE 5 MG tablet Commonly known as: GLUCOTROL Take 5 mg by mouth 2 (two) times daily.   HYDROcodone-acetaminophen 5-325  MG tablet Commonly known as: NORCO/VICODIN Take 1 tablet by mouth every 6 (six) hours as needed for up to 3 days for moderate pain or severe pain.   hydrocortisone 25 MG suppository Commonly known as: ANUSOL-HC Place 1 suppository (25 mg total) rectally 2 (two) times daily for 6 days.   lactulose 10 GM/15ML solution Commonly known as: CHRONULAC Take 30 mLs (20 g total) by mouth 2 (two) times daily for 14 days.   loratadine 10 MG tablet Commonly known as: CLARITIN Take 10 mg by mouth daily.   methocarbamol 500 MG tablet Commonly known as: ROBAXIN Take 1 tablet (500 mg total) by mouth every 6 (six) hours as needed for up to 3 days for muscle spasms.   montelukast 10 MG tablet Commonly known as: SINGULAIR  Take 10 mg by mouth at bedtime.   mupirocin nasal ointment 2 % Commonly known as: BACTROBAN Place 1 application into the nose 2 (two) times daily. Use one-half of tube in each nostril twice daily for five (5) days. After application, press sides of nose together and gently massage.   ondansetron 4 MG tablet Commonly known as: ZOFRAN Take 1 tablet (4 mg total) by mouth every 6 (six) hours as needed for nausea.   potassium chloride 10 MEQ tablet Commonly known as: K-DUR Take 10 mEq by mouth daily.   simvastatin 40 MG tablet Commonly known as: ZOCOR Take 40 mg by mouth at bedtime.   sitaGLIPtin-metformin 50-500 MG tablet Commonly known as: JANUMET Take 1 tablet by mouth 2 (two) times daily with a meal.   spironolactone 50 MG tablet Commonly known as: ALDACTONE Take 1 tablet (50 mg total) by mouth daily.      Follow-up Information    Ian Bushman, MD. Schedule an appointment as soon as possible for a visit.   Specialty: Transplant Hepatology Why: will need to follow up with Dr Manuella Ghazi in next several weeks for cirrhosis.   Contact information: [place] [address] [place] [address] [phone]        Skillman, Katherine E, PA-C. Schedule an  appointment as soon as possible for a visit in 1 week(s).   Specialty: Physician Assistant Contact information: [place] [address] [phone]        Arnoldo Lenis, MD .   Specialty: Cardiology Contact information: [place] [address] [phone]          Allergies  Allergen Reactions  . Asa [Aspirin] Other (See Comments)    Pt not allergic but cannot take due to bleeding   . Other Other (See Comments)    Any jewelry metal-hives and itching     You were cared for by a hospitalist during your hospital stay. If you have any questions about your discharge medications or the care you received while you were in the hospital after you are discharged, you can call the unit and asked to speak with the hospitalist on call if the hospitalist that took care of you is not available. Once you are discharged, your primary care physician will handle any further medical issues. Please note that no refills for any discharge medications will be authorized once you are discharged, as it is imperative that you return to your primary care physician (or establish a relationship with a primary care physician if you do not have one) for your aftercare needs so that they can reassess your need for medications and monitor your lab values.   Procedures/Studies: Ct Knee Right Wo Contrast  Result Date: 09/24/2018 CLINICAL DATA:  Fracture dislocation. Recent total knee arthroplasty with knee injury EXAM: CT OF THE right KNEE WITHOUT CONTRAST TECHNIQUE: Multidetector CT imaging of the right knee was performed according to the standard protocol. Multiplanar CT image reconstructions were also generated. COMPARISON:  Radiograph 09/24/2018 FINDINGS: Examination is limited by significant artifact. As demonstrated on the radiographs there is a complex periprosthetic fracture involving the femoral component with significant varus deformity. There is also a posterior knee  dislocation. Possible subtle fracture involving the lateral tibial plateau on the sagittal reformatted images but I can not confirm that on other views and may be artifactual. Appears to be a small avulsion fracture at the tibial tubercle. IMPRESSION: 1. Complex periprosthetic fracture involving  the femoral component of the total knee arthroplasty. Significant varus deformity. 2. Posterior knee dislocation. 3. Avulsion fracture involving the tibial tubercle. No definite other tibial fractures. Electronically Signed   By: Marijo Sanes M.D.   On: 09/24/2018 11:15   US Abdomen Complete  Result Date: 10/11/2018 CLINICAL DATA:  Cirrhosis EXAM: ABDOMEN ULTRASOUND COMPLETE COMPARISON:  None. FINDINGS: Gallbladder: No gallstones or wall thickening visualized. No sonographic Murphy sign noted by sonographer. Common bile duct: Diameter: 6 mm Liver: Cirrhotic liver. No focal mass or lesion identified within the liver. Portal vein is patent on color Doppler imaging with normal direction of blood flow towards the liver. IVC: No abnormality visualized. Pancreas: Obscured by overlying bowel gas. Spleen: Upper normal in size. Right Kidney: Length: 11.9 cm. Echogenicity within normal limits. No mass or hydronephrosis visualized. Left Kidney: Length: 11.4 cm. Echogenicity within normal limits. No mass or hydronephrosis visualized. Abdominal aorta: No aneurysm visualized. Mid to distal aorta obscured by overlying bowel gas. Other findings: Ascites.  RIGHT pleural effusion. IMPRESSION: 1. Cirrhotic liver.  No focal liver lesion identified. 2. Ascites. 3. RIGHT pleural effusion Electronically Signed   By: Franki Cabot M.D.   On: 10/11/2018 15:36   US Abdomen Complete  Result Date: 09/26/2018 CLINICAL DATA:  Cirrhosis. EXAM: ABDOMEN ULTRASOUND COMPLETE COMPARISON:  Abdominal ultrasound 04/19/2015. MRCP 04/26/2015 and CT abdomen 04/07/2017. FINDINGS: Gallbladder: Mild gallbladder wall thickening without sonographic Murphy's sign.  No evidence of gallstones. Common bile duct: Diameter: 6 mm Liver: There are morphologic changes of cirrhosis with heterogeneity of the hepatic parenchyma. No focal lesions are identified. Portal vein is patent on color Doppler imaging with normal direction of blood flow towards the liver. IVC: No abnormality visualized. Pancreas: Visualized portion unremarkable. Spleen: Size and appearance within normal limits. Right Kidney: Length: 9.5 cm. Echogenicity within normal limits. No mass or hydronephrosis visualized. Left Kidney: Length: 10.5 cm. Echogenicity within normal limits. No mass or hydronephrosis visualized. Abdominal aorta: No aneurysm visualized. Portions of the aorta are obscured by bowel gas. Other findings: A small amount of ascites is present in the right and left upper quadrants of the abdomen. IMPRESSION: 1. Morphologic changes of cirrhosis without acute or focal abnormality identified. 2. Mild nonspecific gallbladder wall thickening, likely related to underlying liver disease. 3. Mild ascites. Electronically Signed   By: Richardean Sale M.D.   On: 09/26/2018 09:32   Dg Pelvis Portable  Result Date: 09/29/2018 CLINICAL DATA:  Left hip pain since right knee surgery 2 days ago. EXAM: PORTABLE PELVIS 1-2 VIEWS COMPARISON:  05/25/2018. FINDINGS: The left hip continues to have a normal appearance, as does the right hip. Lower lumbar spine degenerative changes are again noted. No fracture or dislocation seen. A single, tiny surgical clip or radiation seed is again noted in the inferior pelvis. IMPRESSION: No acute abnormality. Electronically Signed   By: Claudie Revering M.D.   On: 09/29/2018 09:33   Dg Knee Complete 4 Views Right  Result Date: 09/24/2018 CLINICAL DATA:  Right knee replacement, discharged yesterday. Twisting knee getting to the car at discharge with 2 falls tonight. Right knee pain. EXAM: RIGHT KNEE - COMPLETE 4+ VIEW COMPARISON:  None. FINDINGS: The femoral component of right knee  arthroplasty is dislocated anteriorly with respect to the tibial tray. Acute periprosthetic fracture of the distal femur, fracture plane not well assessed due to dislocation. Patella appears aligned with the femoral component, there is been patellar resurfacing. Diffuse soft tissue edema. IMPRESSION: Acute fracture dislocation of total right knee arthroplasty  with anterior dislocation of the femoral component and associated periprosthetic femoral fracture. Electronically Signed   By: Keith Rake M.D.   On: 09/24/2018 02:25   Dg Knee Right Port  Result Date: 09/29/2018 CLINICAL DATA:  Diffuse right leg pain since right knee surgery 2 days ago. EXAM: PORTABLE RIGHT KNEE - 1-2 VIEW COMPARISON:  Right knee CT and radiographs dated 09/24/2018. FINDINGS: The previously demonstrated right knee prosthesis has been replaced with surgical absence of the previously demonstrated distal femur fracture. The prosthetic components are in anatomic position and alignment. There is associated intra-articular postoperative air and a surgical drain. No acute fracture or dislocation is seen. IMPRESSION: Postoperative changes, as described above. No acute abnormality. Electronically Signed   By: Claudie Revering M.D.   On: 09/29/2018 09:35   Ct Head Code Stroke Wo Contrast  Result Date: 09/25/2018 CLINICAL DATA:  Code stroke. Slurred speech. Tremors. Altered mental status. Week graft right. EXAM: CT HEAD WITHOUT CONTRAST TECHNIQUE: Contiguous axial images were obtained from the base of the skull through the vertex without intravenous contrast. COMPARISON:  06/08/2015 FINDINGS: Brain: Mild age related volume. No evidence of old or acute focal infarction, mass lesion, hydrocephalus or extra-axial collection. Vascular: No abnormal vascular finding Skull: Normal Sinuses/Orbits: Inflammatory changes of the paranasal sinuses affecting sinus Other: None ASPECTS ([place] Stroke Program Early CT Score) - Ganglionic level infarction  (caudate, lentiform nuclei, internal capsule, insula, M1-M3 cortex): 7 - Supraganglionic infarction (M4-M6 cortex): 3 Total score (0-10 with 10 being normal): 10 IMPRESSION: 1. Normal head CT for age. 2. Paranasal sinusitis worse in the right maxillary sinus. 3. ASPECTS is 10. 4. I spoke to the patient's nurse at 1050 hours, but we are unable to reach a covering physician but will keep trying. Electronically Signed   By: Nelson Chimes M.D.   On: 09/25/2018 10:55   Ir Paracentesis  Result Date: 10/13/2018 INDICATION: History of cirrhosis, endometrial cancer, hematochezia, ascites, acute kidney injury. Request made for diagnostic paracentesis. EXAM: ULTRASOUND GUIDED DIAGNOSTIC PARACENTESIS MEDICATIONS: None COMPLICATIONS: None immediate. PROCEDURE: Informed written consent was obtained from the patient after a discussion of the risks, benefits and alternatives to treatment. A timeout was performed prior to the initiation of the procedure. Initial ultrasound scanning demonstrates a small amount of ascites within the right mid to lower abdominal quadrant. The right mid to lower abdomen was prepped and draped in the usual sterile fashion. 1% lidocaine was used for local anesthesia. Following this, a 6 Fr Safe-T-Centesis catheter was introduced. An ultrasound image was saved for documentation purposes. The paracentesis was performed. The catheter was removed and a dressing was applied. The patient tolerated the procedure well without immediate post procedural complication. FINDINGS: A total of approximately 850 cc of yellow fluid was removed. Samples were sent to the laboratory as requested by the clinical team. IMPRESSION: Successful ultrasound-guided diagnostic paracentesis yielding 850 cc of peritoneal fluid. Read by: Rowe Robert, PA-C Electronically Signed   By: Sandi Mariscal M.D.   On: 10/13/2018 10:43     The results of significant diagnostics from this hospitalization (including imaging, microbiology,  ancillary and laboratory) are listed below for reference.     Microbiology: Recent Results (from the past 240 hour(s))  SARS Coronavirus 2 (CEPHEID - Performed in [facility] hospital lab), Hosp Order     Status: None   Collection Time: 10/10/18 10:02 PM   Specimen: Nasopharyngeal Swab  Result Value Ref Range Status   SARS Coronavirus 2 NEGATIVE NEGATIVE Final  Comment: (NOTE) If result is NEGATIVE SARS-CoV-2 target nucleic acids are NOT DETECTED. The SARS-CoV-2 RNA is generally detectable in upper and lower  respiratory specimens during the acute phase of infection. The lowest  concentration of SARS-CoV-2 viral copies this assay can detect is 250  copies / mL. A negative result does not preclude SARS-CoV-2 infection  and should not be used as the sole basis for treatment or other  patient management decisions.  A negative result may occur with  improper specimen collection / handling, submission of specimen other  than nasopharyngeal swab, presence of viral mutation(s) within the  areas targeted by this assay, and inadequate number of viral copies  (<250 copies / mL). A negative result must be combined with clinical  observations, patient history, and epidemiological information. If result is POSITIVE SARS-CoV-2 target nucleic acids are DETECTED. The SARS-CoV-2 RNA is generally detectable in upper and lower  respiratory specimens dur ing the acute phase of infection.  Positive  results are indicative of active infection with SARS-CoV-2.  Clinical  correlation with patient history and other diagnostic information is  necessary to determine patient infection status.  Positive results do  not rule out bacterial infection or co-infection with other viruses. If result is PRESUMPTIVE POSTIVE SARS-CoV-2 nucleic acids MAY BE PRESENT.   A presumptive positive result was obtained on the submitted specimen  and confirmed on repeat testing.  While 2019 novel coronavirus  (SARS-CoV-2)  nucleic acids may be present in the submitted sample  additional confirmatory testing may be necessary for epidemiological  and / or clinical management purposes  to differentiate between  SARS-CoV-2 and other Sarbecovirus currently known to infect humans.  If clinically indicated additional testing with an alternate test  methodology [phone]) is advised. The SARS-CoV-2 RNA is generally  detectable in upper and lower respiratory sp ecimens during the acute  phase of infection. The expected result is Negative. Fact Sheet for Patients:  StrictlyIdeas.no Fact Sheet for Healthcare Providers: BankingDealers.co.za This test is not yet approved or cleared by the [place] FDA and has been authorized for detection and/or diagnosis of SARS-CoV-2 by FDA under an Emergency Use Authorization (EUA).  This EUA will remain in effect (meaning this test can be used) for the duration of the COVID-19 declaration under Section 564(b)(1) of the Act, 21 U.S.C. section 360bbb-3(b)(1), unless the authorization is terminated or revoked sooner. Performed at [facility], [place] [address], [place], [place] [address]   Gram stain     Status: None   Collection Time: 10/13/18 10:44 AM   Specimen: Peritoneal Fluid  Result Value Ref Range Status   Specimen Description FLUID PERITONEAL  Final   Special Requests NONE  Final   Gram Stain   Final    WBC PRESENT,BOTH PMN AND MONONUCLEAR NO ORGANISMS SEEN CYTOSPIN SMEAR Performed at [facility], [address] [place], [place] [address]    Report Status 10/13/2018 FINAL  Final  Culture, body fluid-bottle     Status: None (Preliminary result)   Collection Time: 10/13/18 10:44 AM   Specimen: Fluid  Result Value Ref Range Status   Specimen Description FLUID PERITONEAL  Final   Special Requests BOTTLES DRAWN AEROBIC AND ANAEROBIC  Final   Culture   Final    NO GROWTH < 24 HOURS Performed at [facility], [place] [address], [place], [place] [address]    Report Status PENDING  Incomplete  SARS Coronavirus 2 (CEPHEID - Performed in [place]  hospital lab), Hosp Order     Status: None   Collection Time: 10/14/18  9:38 AM   Specimen: Nasopharyngeal Swab  Result Value Ref Range Status   SARS Coronavirus 2 NEGATIVE NEGATIVE Final    Comment: (NOTE) If result is NEGATIVE SARS-CoV-2 target nucleic acids are NOT DETECTED. The SARS-CoV-2 RNA is generally detectable in upper and lower  respiratory specimens during the acute phase of infection. The lowest  concentration of SARS-CoV-2 viral copies this assay can detect is 250  copies / mL. A negative result does not preclude SARS-CoV-2 infection  and should not be used as the sole basis for treatment or other  patient management decisions.  A negative result may occur with  improper specimen collection / handling, submission of specimen other  than nasopharyngeal swab, presence of viral mutation(s) within the  areas targeted by this assay, and inadequate number of viral copies  (<250 copies / mL). A negative result must be combined with clinical  observations, patient history, and epidemiological information. If result is POSITIVE SARS-CoV-2 target nucleic acids are DETECTED. The SARS-CoV-2 RNA is generally detectable in upper and lower  respiratory specimens dur ing the acute phase of infection.  Positive  results are indicative of active infection with SARS-CoV-2.  Clinical  correlation with patient history and other diagnostic information is  necessary to determine patient infection status.  Positive results do  not rule out bacterial infection or co-infection with other viruses. If result is PRESUMPTIVE POSTIVE SARS-CoV-2 nucleic acids MAY BE PRESENT.   A presumptive positive result was obtained on the submitted specimen  and confirmed on repeat testing.  While 2019 novel coronavirus  (SARS-CoV-2) nucleic acids may be present in the  submitted sample  additional confirmatory testing may be necessary for epidemiological  and / or clinical management purposes  to differentiate between  SARS-CoV-2 and other Sarbecovirus currently known to infect humans.  If clinically indicated additional testing with an alternate test  methodology [phone]) is advised. The SARS-CoV-2 RNA is generally  detectable in upper and lower respiratory sp ecimens during the acute  phase of infection. The expected result is Negative. Fact Sheet for Patients:  StrictlyIdeas.no Fact Sheet for Healthcare Providers: BankingDealers.co.za This test is not yet approved or cleared by the [place] FDA and has been authorized for detection and/or diagnosis of SARS-CoV-2 by FDA under an Emergency Use Authorization (EUA).  This EUA will remain in effect (meaning this test can be used) for the duration of the COVID-19 declaration under Section 564(b)(1) of the Act, 21 U.S.C. section 360bbb-3(b)(1), unless the authorization is terminated or revoked sooner. Performed at [facility], [place] [address], [place], [place] [address]      Labs: BNP (last 3 results) No results for input(s): BNP in the last 8760 hours. Basic Metabolic Panel: Recent Labs  Lab 10/10/18 2127 10/11/18 0256 10/12/18 0154 10/13/18 0417 10/14/18 0539  NA 132* 132* 135 137 136  K 4.5 5.1 4.4 4.8 4.3  CL 105 105 108 104 104  CO2 22 19* 23 23 24   GLUCOSE 104* 123* 123* 148* 145*  BUN 24* 23 18 14 10   CREATININE 1.51* 1.47* 1.15* 1.08* 0.91  CALCIUM 7.9* 7.8* 8.0* 8.4* 8.2*  MG  --  1.9 1.7 1.7 1.5*  PHOS  --  4.2  --   --   --    Liver Function Tests: Recent Labs  Lab 10/10/18 2127 10/11/18 0256 10/12/18 0154 10/13/18 0417 10/14/18 0539  AST 60*  74* 48* 58* 43*  ALT 42 41 36 31 32  ALKPHOS 141* 141* 126 134* 128*  BILITOT 1.4* 1.0 1.1 0.9 1.0  PROT 5.0* 4.9* 4.4* 4.8* 4.9*  ALBUMIN 2.0* 2.0* 1.8* 2.0* 2.0*    No results for input(s): LIPASE, AMYLASE in the last 168 hours. Recent Labs  Lab 10/11/18 0248 10/12/18 0154  AMMONIA 61* 26   CBC: Recent Labs  Lab 10/10/18 2127 10/11/18 0256 10/11/18 0913 10/12/18 0154 10/13/18 0417 10/14/18 0539  WBC 3.6* 3.6* 3.2* 2.3* 2.0* 1.7*  NEUTROABS 2.2  --   --   --   --   --   HGB 7.7* 7.8* 7.7* 7.0* 7.4* 7.2*  HCT 25.2* 25.2* 24.5* 22.3* 24.0* 23.0*  MCV 99.6 97.7 97.2 97.8 98.4 97.9  PLT 87* 80* 82* 63* 69* 63*   Cardiac Enzymes: No results for input(s): CKTOTAL, CKMB, CKMBINDEX, TROPONINI in the last 168 hours. BNP: Invalid input(s): POCBNP CBG: Recent Labs  Lab 10/13/18 2057 10/14/18 0031 10/14/18 0438 10/14/18 0759 10/14/18 1115  GLUCAP 173* 132* 142* 201* 158*   D-Dimer No results for input(s): DDIMER in the last 72 hours. Hgb A1c No results for input(s): HGBA1C in the last 72 hours. Lipid Profile No results for input(s): CHOL, HDL, LDLCALC, TRIG, CHOLHDL, LDLDIRECT in the last 72 hours. Thyroid function studies No results for input(s): TSH, T4TOTAL, T3FREE, THYROIDAB in the last 72 hours.  Invalid input(s): FREET3 Anemia work up Recent Labs    10/13/18 0831  VITAMINB12 504  FERRITIN 246  TIBC 315  IRON 76   Urinalysis    Component Value Date/Time   COLORURINE YELLOW 10/10/2018 2143   APPEARANCEUR HAZY (A) 10/10/2018 2143   LABSPEC 1.010 10/10/2018 2143   PHURINE 5.0 10/10/2018 2143   GLUCOSEU NEGATIVE 10/10/2018 2143   HGBUR SMALL (A) 10/10/2018 2143   BILIRUBINUR NEGATIVE 10/10/2018 2143   BILIRUBINUR NEGATIVE 03/06/2013 1100   KETONESUR NEGATIVE 10/10/2018 2143   PROTEINUR NEGATIVE 10/10/2018 2143   UROBILINOGEN negative 03/06/2013 1100   NITRITE NEGATIVE 10/10/2018 2143   LEUKOCYTESUR MODERATE (A) 10/10/2018 2143   Sepsis Labs Invalid input(s): PROCALCITONIN,  WBC,  LACTICIDVEN Microbiology Recent Results (from the past 240 hour(s))  SARS Coronavirus 2 (CEPHEID - Performed in [facility] hospital  lab), Hosp Order     Status: None   Collection Time: 10/10/18 10:02 PM   Specimen: Nasopharyngeal Swab  Result Value Ref Range Status   SARS Coronavirus 2 NEGATIVE NEGATIVE Final    Comment: (NOTE) If result is NEGATIVE SARS-CoV-2 target nucleic acids are NOT DETECTED. The SARS-CoV-2 RNA is generally detectable in upper and lower  respiratory specimens during the acute phase of infection. The lowest  concentration of SARS-CoV-2 viral copies this assay can detect is 250  copies / mL. A negative result does not preclude SARS-CoV-2 infection  and should not be used as the sole basis for treatment or other  patient management decisions.  A negative result may occur with  improper specimen collection / handling, submission of specimen other  than nasopharyngeal swab, presence of viral mutation(s) within the  areas targeted by this assay, and inadequate number of viral copies  (<250 copies / mL). A negative result must be combined with clinical  observations, patient history, and epidemiological information. If result is POSITIVE SARS-CoV-2 target nucleic acids are DETECTED. The SARS-CoV-2 RNA is generally detectable in upper and lower  respiratory specimens dur ing the acute phase of infection.  Positive  results are indicative of  active infection with SARS-CoV-2.  Clinical  correlation with patient history and other diagnostic information is  necessary to determine patient infection status.  Positive results do  not rule out bacterial infection or co-infection with other viruses. If result is PRESUMPTIVE POSTIVE SARS-CoV-2 nucleic acids MAY BE PRESENT.   A presumptive positive result was obtained on the submitted specimen  and confirmed on repeat testing.  While 2019 novel coronavirus  (SARS-CoV-2) nucleic acids may be present in the submitted sample  additional confirmatory testing may be necessary for epidemiological  and / or clinical management purposes  to differentiate between   SARS-CoV-2 and other Sarbecovirus currently known to infect humans.  If clinically indicated additional testing with an alternate test  methodology [phone]) is advised. The SARS-CoV-2 RNA is generally  detectable in upper and lower respiratory sp ecimens during the acute  phase of infection. The expected result is Negative. Fact Sheet for Patients:  StrictlyIdeas.no Fact Sheet for Healthcare Providers: BankingDealers.co.za This test is not yet approved or cleared by the [place] FDA and has been authorized for detection and/or diagnosis of SARS-CoV-2 by FDA under an Emergency Use Authorization (EUA).  This EUA will remain in effect (meaning this test can be used) for the duration of the COVID-19 declaration under Section 564(b)(1) of the Act, 21 U.S.C. section 360bbb-3(b)(1), unless the authorization is terminated or revoked sooner. Performed at [facility], [place] [address], [place], [place] [address]   Gram stain     Status: None   Collection Time: 10/13/18 10:44 AM   Specimen: Peritoneal Fluid  Result Value Ref Range Status   Specimen Description FLUID PERITONEAL  Final   Special Requests NONE  Final   Gram Stain   Final    WBC PRESENT,BOTH PMN AND MONONUCLEAR NO ORGANISMS SEEN CYTOSPIN SMEAR Performed at [facility], [address] [place], [place] [address]    Report Status 10/13/2018 FINAL  Final  Culture, body fluid-bottle     Status: None (Preliminary result)   Collection Time: 10/13/18 10:44 AM   Specimen: Fluid  Result Value Ref Range Status   Specimen Description FLUID PERITONEAL  Final   Special Requests BOTTLES DRAWN AEROBIC AND ANAEROBIC  Final   Culture   Final    NO GROWTH < 24 HOURS Performed at [facility], [place] [address] [place], [place] [address]    Report Status PENDING  Incomplete  SARS Coronavirus 2 (CEPHEID - Performed in [facility] hospital lab), Hosp Order     Status: None    Collection Time: 10/14/18  9:38 AM   Specimen: Nasopharyngeal Swab  Result Value Ref Range Status   SARS Coronavirus 2 NEGATIVE NEGATIVE Final    Comment: (NOTE) If result is NEGATIVE SARS-CoV-2 target nucleic acids are NOT DETECTED. The SARS-CoV-2 RNA is generally detectable in upper and lower  respiratory specimens during the acute phase of infection. The lowest  concentration of SARS-CoV-2 viral copies this assay can detect is 250  copies / mL. A negative result does not preclude SARS-CoV-2 infection  and should not be used as the sole basis for treatment or other  patient management decisions.  A negative result may occur with  improper specimen collection / handling, submission of specimen other  than nasopharyngeal swab, presence of viral mutation(s) within the  areas targeted by this assay, and inadequate number of viral copies  (<250 copies / mL). A negative result must be combined with clinical  observations, patient history,  and epidemiological information. If result is POSITIVE SARS-CoV-2 target nucleic acids are DETECTED. The SARS-CoV-2 RNA is generally detectable in upper and lower  respiratory specimens dur ing the acute phase of infection.  Positive  results are indicative of active infection with SARS-CoV-2.  Clinical  correlation with patient history and other diagnostic information is  necessary to determine patient infection status.  Positive results do  not rule out bacterial infection or co-infection with other viruses. If result is PRESUMPTIVE POSTIVE SARS-CoV-2 nucleic acids MAY BE PRESENT.   A presumptive positive result was obtained on the submitted specimen  and confirmed on repeat testing.  While 2019 novel coronavirus  (SARS-CoV-2) nucleic acids may be present in the submitted sample  additional confirmatory testing may be necessary for epidemiological  and / or clinical management purposes  to differentiate between  SARS-CoV-2 and other Sarbecovirus  currently known to infect humans.  If clinically indicated additional testing with an alternate test  methodology [phone]) is advised. The SARS-CoV-2 RNA is generally  detectable in upper and lower respiratory sp ecimens during the acute  phase of infection. The expected result is Negative. Fact Sheet for Patients:  StrictlyIdeas.no Fact Sheet for Healthcare Providers: BankingDealers.co.za This test is not yet approved or cleared by the [place] FDA and has been authorized for detection and/or diagnosis of SARS-CoV-2 by FDA under an Emergency Use Authorization (EUA).  This EUA will remain in effect (meaning this test can be used) for the duration of the COVID-19 declaration under Section 564(b)(1) of the Act, 21 U.S.C. section 360bbb-3(b)(1), unless the authorization is terminated or revoked sooner. Performed at [facility], [place] [address], [place], [place] [address]      Time coordinating discharge:  I have spent 35 minutes face to face with the patient and on the ward discussing the patients care, assessment, plan and disposition with other care givers. >50% of the time was devoted counseling the patient about the risks and benefits of treatment/Discharge disposition and coordinating care.   SIGNED:   Damita Lack, MD  [facility] 10/14/2018, 12:06 PM   If 7PM-7AM, please contact night-coverage [email or web address]

## 2018-10-15 DIAGNOSIS — T84012D Broken internal right knee prosthesis, subsequent encounter: Secondary | ICD-10-CM | POA: Diagnosis not present

## 2018-10-15 DIAGNOSIS — D62 Acute posthemorrhagic anemia: Secondary | ICD-10-CM | POA: Diagnosis not present

## 2018-10-15 DIAGNOSIS — E1165 Type 2 diabetes mellitus with hyperglycemia: Secondary | ICD-10-CM | POA: Diagnosis not present

## 2018-10-15 LAB — TYPE AND SCREEN
ABO/RH(D): A POS
Antibody Screen: NEGATIVE
Unit division: 0

## 2018-10-15 LAB — BPAM RBC
Blood Product Expiration Date: 202008152359
ISSUE DATE / TIME: 202007241025
Unit Type and Rh: 6200

## 2018-10-17 LAB — GLUCOSE, CAPILLARY: Glucose-Capillary: 178 mg/dL — ABNORMAL HIGH (ref 70–99)

## 2018-10-18 LAB — CULTURE, BODY FLUID W GRAM STAIN -BOTTLE: Culture: NO GROWTH

## 2018-10-19 DIAGNOSIS — Z4789 Encounter for other orthopedic aftercare: Secondary | ICD-10-CM | POA: Diagnosis not present

## 2018-10-25 DIAGNOSIS — E1165 Type 2 diabetes mellitus with hyperglycemia: Secondary | ICD-10-CM | POA: Diagnosis not present

## 2018-10-25 DIAGNOSIS — D62 Acute posthemorrhagic anemia: Secondary | ICD-10-CM | POA: Diagnosis not present

## 2018-10-25 DIAGNOSIS — T84012D Broken internal right knee prosthesis, subsequent encounter: Secondary | ICD-10-CM | POA: Diagnosis not present

## 2018-10-26 ENCOUNTER — Encounter (INDEPENDENT_AMBULATORY_CARE_PROVIDER_SITE_OTHER): Payer: Self-pay | Admitting: Nurse Practitioner

## 2018-10-26 ENCOUNTER — Ambulatory Visit (INDEPENDENT_AMBULATORY_CARE_PROVIDER_SITE_OTHER): Payer: Medicare Other | Admitting: Nurse Practitioner

## 2018-10-26 ENCOUNTER — Other Ambulatory Visit: Payer: Self-pay

## 2018-10-26 VITALS — BP 125/71 | HR 69 | Temp 98.0°F | Resp 18 | Ht 62.0 in | Wt 178.0 lb

## 2018-10-26 DIAGNOSIS — R110 Nausea: Secondary | ICD-10-CM

## 2018-10-26 DIAGNOSIS — K7460 Unspecified cirrhosis of liver: Secondary | ICD-10-CM

## 2018-10-26 DIAGNOSIS — R188 Other ascites: Secondary | ICD-10-CM

## 2018-10-26 NOTE — Patient Instructions (Signed)
1.  Our office will contact [facility] to schedule an EGD and flexible sigmoidoscopy  2. Our office will obtain a copy of your most recent lab results  3. Further evaluation to be determined after the above procedures are completed

## 2018-10-26 NOTE — Progress Notes (Addendum)
Subjective:    Patient ID: Brittany Russo, female    DOB: 1946-03-21, 73 y.o.   MRN: [id number]  HPI  Brittany Russo is a 73 y.o. female with a significant past medical history including NASH cirrhosis, endometrial cancer treated with hysterectomy 1996 then radiation for recurrence 2003, developed radiation proctitis, vaginal cancer, DM II, seizure disorder. She is S/P right total knee replacement 09/2018   S/P right TKR 09/22/2018, discharged home the next day. Unfortunately, she fell at home and readmitted to [facility] 7/4 with and a fractured right distal femur, dislocation of the femoral component of the knee replacement. She underwent surgical revision  On 7/7. During this hospital admission, she developed hepatic encephalopathy, ammonia level was 66 which improved with lactulose. CT of head was negative. She was discharged to a SNF 09/30/2018.  She developed hematochezia and she presented to [facility]  ED 7/20. Hg 8 down to 7.2. She received 1 unit of PRBCs. INR 1.5. It was unclear if she was taking Eliquis for DVT prophylaxis post ortho surgery. She had AKI which improved. She developed ascites and edema. She underwent a paracentesis 7/23 and 850cc peritoneal fluid was removed without evidence of SBP. She was started on Lasix 40mg  po QD and Spironolactone 50mg  QD. Her hematochezia resolved after receiving Hydrocortisone 25mg  suppositories. A flexible sigmoidoscopy was not done.   She presents today accompanied by her husband for further evaluation regarding cirrhosis and hematochezia. She complains of having nausea which has worsened over the past 2 to 3 days, no vomiting. No heartburn or dysphagia. She has some upper abdominal pain but complains of lower abdominal cramping which is more bothersome to her. She is having 3 to 4 loose stools daily since taking Lactulose bid. However, she passed a solid soft brown stool x 2 today. She stated the nursing staff at [facility] re[facility] reported seeing red  blood with her Bms for the past few days. Appetite is decreased. Urinating normal yellow urine. She is on ASA 81mg  daily, no oral anticoagulants.  Abdominal sonogram 10/11/2018: cirrhotic liver, no focal liver lesion identified, ascites, right pleural effusion  Past GI procedures: Colonoscopy 06/30/2007 findings of radiation proctitis.  Patient reports Dr. Britta Mccreedy did a colonoscopy in 2014 and removed one polyp. Dr. Britta Mccreedy performed EGD in 2016 with unknown findings.    Past Medical History:  Diagnosis Date  . Allergy   . Anxiety   . Arthritis   . Ascites 09/2018   no SBP on 850 cc tap 10/13/18  . Cirrhosis of liver ([place]) ~ 2004   from NAFLD.  Dx ~ 2004.  Dr Dorcas Mcmurray at [facility]/    . Depression   . Diabetes mellitus without complication ([place])    type 2  . Eczema of both hands   . Endometrial cancer ([place]) 1996, 2003   hysterectomy 1996.  recurrence 2003, treated with radiation.    [name] Headache   . 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 in [place]  . Hyperlipidemia   . Hypertension   . Seizures ([place])    last seizure June 07, 2014   . Thrombocytopenia ([place])   . Thyroid nodule    Past Surgical History:  Procedure Laterality Date  . CATARACT EXTRACTION W/PHACO Left 06/04/2017   Procedure: CATARACT EXTRACTION PHACO AND INTRAOCULAR LENS PLACEMENT (IOC);  Surgeon: Baruch Goldmann, MD;  Location: [facility];  Service: Ophthalmology;  Laterality: Left;  CDE: 3.90  . CATARACT EXTRACTION  W/PHACO Right 07/15/2017   Procedure: CATARACT EXTRACTION PHACO AND INTRAOCULAR LENS PLACEMENT (IOC);  Surgeon: Baruch Goldmann, MD;  Location: [facility];  Service: Ophthalmology;  Laterality: Right;  CDE: 7.60  . COLONOSCOPY  06/2007   in [place]. for hematochzia.  radiation proctitis  . DILATION AND CURETTAGE OF UTERUS    . ESOPHAGOGASTRODUODENOSCOPY  2016  . IR PARACENTESIS  10/13/2018  . TOTAL ABDOMINAL HYSTERECTOMY  1996  . TOTAL KNEE ARTHROPLASTY Right  09/22/2018   Procedure: TOTAL KNEE ARTHROPLASTY;  Surgeon: Paralee Cancel, MD;  Location: [facility];  Service: Orthopedics;  Laterality: Right;  70 mins  . TOTAL KNEE REVISION Right 09/27/2018   Procedure: TOTAL KNEE REVISION;  Surgeon: Paralee Cancel, MD;  Location: [facility];  Service: Orthopedics;  Laterality: Right;   Current Outpatient Medications on File Prior to Visit  Medication Sig Dispense Refill  . ALPRAZolam (XANAX) 0.25 MG tablet Take 0.25 mg by mouth 2 (two) times daily.    [name] atenolol (TENORMIN) 50 MG tablet Take 50 mg by mouth daily.    [name] atorvastatin (LIPITOR) 40 MG tablet Take 40 mg by mouth daily at 6 PM.    . celecoxib (CELEBREX) 200 MG capsule Take 1 capsule (200 mg total) by mouth 2 (two) times daily. 60 capsule 0  . citalopram (CELEXA) 40 MG tablet Take 40 mg by mouth daily.    [name] diltiazem (CARTIA XT) 180 MG 24 hr capsule Take 180 mg by mouth daily.    . fluticasone (FLONASE) 50 MCG/ACT nasal spray Place 1 spray into both nostrils daily as needed for allergies.     . furosemide (LASIX) 40 MG tablet Take 1 tablet (40 mg total) by mouth daily. 30 tablet 0  . glipiZIDE (GLUCOTROL) 5 MG tablet Take 5 mg by mouth 2 (two) times daily.    . Insulin Glargine (BASAGLAR KWIKPEN) 100 UNIT/ML SOPN Inject 65 Units into the skin daily.     [name] lactulose (CHRONULAC) 10 GM/15ML solution Take 30 g by mouth 2 (two) times daily as needed for mild constipation.    [name] loratadine (CLARITIN) 10 MG tablet Take 10 mg by mouth daily.    . montelukast (SINGULAIR) 10 MG tablet Take 10 mg by mouth at bedtime.    . mupirocin nasal ointment (BACTROBAN) 2 % Place 1 application into the nose 2 (two) times daily. Use one-half of tube in each nostril twice daily for five (5) days. After application, press sides of nose together and gently massage.    . ondansetron (ZOFRAN) 4 MG tablet Take 1 tablet (4 mg total) by mouth every 6 (six) hours as needed for nausea. 20 tablet 0  . potassium chloride (K-DUR) 10 MEQ tablet Take  10 mEq by mouth daily.    . rifaximin (XIFAXAN) 550 MG TABS tablet Take 550 mg by mouth 2 (two) times daily.    . simvastatin (ZOCOR) 40 MG tablet Take 40 mg by mouth at bedtime.    . sitaGLIPtin-metformin (JANUMET) 50-500 MG per tablet Take 1 tablet by mouth 2 (two) times daily with a meal.    . spironolactone (ALDACTONE) 50 MG tablet Take 1 tablet (50 mg total) by mouth daily. 30 tablet 0  . vitamin C (ASCORBIC ACID) 500 MG tablet Take 500 mg by mouth daily.    [name] aspirin 81 MG chewable tablet Chew 1 tablet (81 mg total) by mouth 2 (two) times a day for 28 days. (Patient not taking: Reported on 10/26/2018) 56 tablet 0  . ferrous  sulfate 325 (65 FE) MG tablet Take 1 tablet (325 mg total) by mouth 2 (two) times daily with a meal for 14 days. 28 tablet 0  . insulin glargine (LANTUS) 100 UNIT/ML injection Inject 65 Units into the skin at bedtime.    . [DISCONTINUED] Ibuprofen-Diphenhydramine Cit (ADVIL PM PO) Take 60 mg by mouth at bedtime.     No current facility-administered medications on file prior to visit.    Allergies  Allergen Reactions  . Asa [Aspirin] Other (See Comments)    Pt not allergic but cannot take due to bleeding   . Other Other (See Comments)    Any jewelry metal-hives and itching    Family History  Problem Relation Age of Onset  . Heart disease Mother   . Diabetes Mother   . Heart disease Father   . Cancer Father   . Cancer Maternal Grandmother   . Heart disease Maternal Grandfather    Social History   Socioeconomic History  . Marital status: Married    Spouse name: Not on file  . Number of children: Not on file  . Years of education: Not on file  . Highest education level: Not on file  Occupational History  . Not on file  Social Needs  . Financial resource strain: Not on file  . Food insecurity    Worry: Not on file    Inability: Not on file  . Transportation needs    Medical: Not on file    Non-medical: Not on file  Tobacco Use  . Smoking status:  Never Smoker  . Smokeless tobacco: Never Used  Substance and Sexual Activity  . Alcohol use: No  . Drug use: No  . Sexual activity: Never  Lifestyle  . Physical activity    Days per week: Not on file    Minutes per session: Not on file  . Stress: Not on file  Relationships  . Social Herbalist on phone: Not on file    Gets together: Not on file    Attends religious service: Not on file    Active member of club or organization: Not on file    Attends meetings of clubs or organizations: Not on file    Relationship status: Not on file  . Intimate partner violence    Fear of current or ex partner: Not on file    Emotionally abused: Not on file    Physically abused: Not on file    Forced sexual activity: Not on file  Other Topics Concern  . Not on file  Social History Narrative  . Not on file   Review of Systems See HPI, all other systems reviewed and are negative      Objective:   Physical Exam  BP 125/71   Pulse 69   Temp 98 F (36.7 C) (Oral)   Resp 18   Wt 178 lb (80.7 kg) Comment: per patient  BMI 32.56 kg/m   General: 73 y.o. female presents in a wheel chair in NAD Eyes: sclera nonicteric Mouth: no ulcers or lesions Neck: supple, no thyromegaly or lymphadenopathy Heart: RRR, no murmurs Lungs: clear throughout Abdomen: protuberant, no significant ascites appreciated, epigastric tenderness without rebound or guarding, + BS x 4 quads, exam completed while patient sitting upright in wheel chair as he is unable to safely transfer to the exam table with right leg fracture and generalized weakness Extremities: compression stockings in use, 1+ edema Neuro: alert and oriented x 3, speech is  clear, no asterixis.  Skin: no jaundice     Assessment & Plan:   1. 73 y.o. female with NASH decompensated cirrhosis with epigastric pain and nausea -EGD to survey for esophageal varices -request copy of CBC, CMP and ammonia level done within the past week at [facility] -continue Lasix and Spironolactone for now -continue Lactulose bi for now, titrate to 3 to 4 BMs daily -Zofran 4 mg po Q 6 hrs PRN  2. Hematochezia, hx of radiation proctitis  -schedule Flexible sigmoidoscopy with EGD   Further recommendations per Dr. Laural Golden  Patient's condition reviewed with Ms. Carl Best, NP. Agree with above recommendations. EGD with flexible sigmoidoscopy would be reasonable as she would not be able to tolerate prep for colonoscopy.  Furthermore would not be a good idea to give her a prep not knowing if she has large esophageal varices. Will review lab data when available.

## 2018-11-03 ENCOUNTER — Telehealth (INDEPENDENT_AMBULATORY_CARE_PROVIDER_SITE_OTHER): Payer: Self-pay | Admitting: *Deleted

## 2018-11-03 NOTE — Telephone Encounter (Signed)
Amber from nursing facility called about patient being sch'd for TCS/EGD -- please advise

## 2018-11-07 ENCOUNTER — Other Ambulatory Visit (INDEPENDENT_AMBULATORY_CARE_PROVIDER_SITE_OTHER): Payer: Self-pay | Admitting: Nurse Practitioner

## 2018-11-07 DIAGNOSIS — K7460 Unspecified cirrhosis of liver: Secondary | ICD-10-CM

## 2018-11-07 DIAGNOSIS — K921 Melena: Secondary | ICD-10-CM

## 2018-11-07 DIAGNOSIS — R110 Nausea: Secondary | ICD-10-CM

## 2018-11-07 NOTE — Telephone Encounter (Signed)
Brittany Russo, I have reviewed the requested lab results on pt. She will need additional labs. I have entered new lab orders ( I will put on your desktop), please contact [facility] re[facility] to get these done and to schedule an EGD and Flex sigmoidoscopy with MAC with Dr. Laural Golden.( I will contact Dr Laural Golden to verify if patient will need prophylactic antibiotics prior to start of procedure as patient is s/p right total knee replacement 09/22/2018). Pls let me know when patient has procedures scheduled. Thank you.

## 2018-11-08 NOTE — Addendum Note (Signed)
Addended by: Noralyn Pick on: 11/08/2018 05:17 PM   Modules accepted: Orders, SmartSet

## 2018-11-09 ENCOUNTER — Encounter (INDEPENDENT_AMBULATORY_CARE_PROVIDER_SITE_OTHER): Payer: Self-pay | Admitting: *Deleted

## 2018-11-09 DIAGNOSIS — R188 Other ascites: Secondary | ICD-10-CM | POA: Insufficient documentation

## 2018-11-09 DIAGNOSIS — K7460 Unspecified cirrhosis of liver: Secondary | ICD-10-CM | POA: Insufficient documentation

## 2018-11-09 NOTE — Telephone Encounter (Signed)
Flex Sig/EGD sch'd 12/16/18, instructions mailed

## 2018-11-16 DIAGNOSIS — Z471 Aftercare following joint replacement surgery: Secondary | ICD-10-CM | POA: Diagnosis not present

## 2018-11-16 DIAGNOSIS — Z96651 Presence of right artificial knee joint: Secondary | ICD-10-CM | POA: Diagnosis not present

## 2018-11-18 ENCOUNTER — Encounter (HOSPITAL_COMMUNITY): Payer: Self-pay | Admitting: Emergency Medicine

## 2018-11-18 NOTE — Progress Notes (Addendum)
[place] INSTRUCTIONS    Name: Brittany Russo      Date of Birth  : 09-08-1945                       Date of Procedure:  September 1st, 2020 Doctor: Paralee Cancel, MD Time to Arrive at [facility]: 4:00pm Report to: Admitting Procedure: Open right knee extensor mechanism repair versus reconstruction  Patient will be admitted after surgery Husband Brittany Russo aware of check-in time and location; plans to be present    No food past midnight the night before your procedure.(To include any tube feedings-must be discontinued). YOU MAY HAVE CLEAR LIQUIDS FROM MIDNIGHT UNTIL 1:00PM. NOTHING BY MOUTH AFTER 1:00PM! (SEE LIQUID DIET BELOW)   CLEAR LIQUID DIET   Foods Allowed                                                                     Foods Excluded  Coffee and tea, regular and decaf                             liquids that you cannot  Plain Jell-O any favor except red or purple                                           see through such as: Fruit ices (not with fruit pulp)                                     milk, soups, orange juice  Iced Popsicles                                    All solid food Carbonated beverages, regular and diet                                    Cranberry, grape and apple juices Sports drinks like Gatorade Lightly seasoned clear broth or consume(fat free) Sugar, honey syrup  Sample Menu Breakfast                                Lunch                                     Supper Cranberry juice                    Beef broth                            Chicken broth Jell-O  Grape juice                           Apple juice Coffee or tea                        Jell-O                                      Popsicle                                                Coffee or tea                        Coffee or tea  _____________________________________________________________________      Take these  morning medications only with sips of water(or give through gastrostomy or feeding tube): ATENOLOL, CARTIA XT, CITALOPRAM, WELLBUTRIN, BUPROPION, ATORVASTATIN, XIFAXAN, LORATADINE, XANAX   How to Manage Your Diabetes Before and After Surgery  Why is it important to control my blood sugar before and after surgery? . Improving blood sugar levels before and after surgery helps healing and can limit problems. . A way of improving blood sugar control is eating a healthy diet by: o  Eating less sugar and carbohydrates o  Increasing activity/exercise o  Talking with your doctor about reaching your blood sugar goals . High blood sugars (greater than 180 mg/dL) can raise your risk of infections and slow your recovery, so you will need to focus on controlling your diabetes during the weeks before surgery. . Make sure that the doctor who takes care of your diabetes knows about your planned surgery including the date and location.  How do I manage my blood sugar before surgery? o Check your blood sugar the morning of your surgery  . If your blood sugar is less than 70 mg/dL, you will need to treat for low blood sugar: o Do not take insulin. o Treat a low blood sugar (less than 70 mg/dL) with  cup of clear juice (cranberry or apple), 4 glucose tablets, OR glucose gel. o Recheck blood sugar in 15 minutes after treatment (to make sure it is greater than 70 mg/dL). If your blood sugar is not greater than 70 mg/dL on recheck, call [phone] for further instructions.  . Report your blood sugar to the short stay nurse when you get to Short Stay.  . If you are admitted to the hospital after surgery: o Your blood sugar will be checked by the staff and you will probably be given insulin after surgery (instead of oral diabetes medicines) to make sure you have good blood sugar levels. o The goal for blood sugar control after surgery is 80-180 mg/dL.   WHAT DO I DO ABOUT MY DIABETES MEDICATION?   . THE  DAY BEFORE SURGERY 11-21-2018, take usual morning dose of LANTUS insulin. If LANTUS is normally given in the evening,  Only take half dose or 32.5 units. Hold evening dose of Glipizide. Take Janumet as usual.     . THE MORNING OF SURGERY 11-22-2018 No Insulin or Diabetic meds should be given or taken      Facility Contact:   [place]  [facility]. [place], [place] [address] [phone]   Family Contact : Brittany Russo (spouse)              Phone: [phone]                 Health Care POA: none, per facility  Transportation contact phone#:  Facility to provide transportation    Please send day of procedure:current med list and meds last taken that day, confirm nothing by mouth status from what time, Patient Demographic info( to include DNR status, problem list, allergies)   RN contact name/phone#:   Hetty Blend, RN    P: [phone] Fax #: [phone] or [phone]  Bring Insurance card and picture ID Leave all jewelry and other valuables at place where living( no metal or rings to be worn) No contact lens Women-no make-up, no lotions,perfumes,powders   Any questions day of procedure call  SHORT STAY-[phone]   Sent from :[facility]                   [place]                   [phone]  Sent by :     Vita Erm, BSN, RN

## 2018-11-18 NOTE — Progress Notes (Addendum)
Spoke with nurse [facility] caring for patient .   The following received from [facility] , documents on chart   Echo 04-27-17 on chart Cbc 11-10-2018 on chart hepatic panel 10-2018 Medication Admin Record discharge summary from July 2020 hospital admission

## 2018-11-18 NOTE — Progress Notes (Signed)
RN called and spoke with patient husband Kala Pilat to make aware of 11-22-2018 surgery time and location of check in. [name] confirmed he will be present day of surgery. RN also provided contact for pre-op dept for question between now and Monday , and also provided Short Stay for questions day of surgery. John thanked RN and states his new cell number is [phone]. RN updated his contact info in patient demographic.

## 2018-11-18 NOTE — Progress Notes (Signed)
Pre-op instructions  For 11-22-2018 surgery faxed to [facility], RN at [facility] . This RN requested on fax note that  Tod Persia, RN call this RN when fax received. Successful fax receipt and copy of instructions placed on patient chart

## 2018-11-18 NOTE — Progress Notes (Signed)
PCP - Levi Aland Cardiologist - jonathan branch   Chest x-ray - epic 2019 EKG - epic  Stress Test - epic 2019 ECHO - epic and on chart  Cardiac Cath -   Sleep Study -  CPAP -   Fasting Blood Sugar -  Checks Blood Sugar _____ times a day  Blood Thinner Instructions: Aspirin Instructions: Last Dose:  Anesthesia review:   [facility] made aware of GI bleed in July 2020   SNF  patient  At [facility] [place]  [address] [place], [place] [address] [phone]

## 2018-11-21 NOTE — Progress Notes (Addendum)
Anesthesia Chart Review   Case: [id number] Date/Time: 11/22/18 1850   Procedure: Open right knee extensor mechanism repair versus reconstruction (Right Knee) - 90 mins   Anesthesia type: Spinal   Pre-op diagnosis: Status post revision right total knee with extensor mechanisum disruption   Location: [place] 09 / [facility]   Surgeon: Paralee Cancel, MD      DISCUSSION:72 y.o. never smoker with h/o HTN, seizures (last seizure 06/07/2014), NASH liver cirrhosis, thrombocytopenia (last labs 11/10/2018 on chart, platelets 70), DM II, status post revision right total knee with extensor mechanism disruption scheduled for above procedure 11/22/2018 with Dr. Paralee Cancel.   Pt last seen by cardiologist, Dr. Carlyle Dolly, 01/18/18, stable at this visit.  Stress Test 09/13/17 which was a low risk study.  Clearance received prior to TKR 09/22/2018 from Dr. Harl Bowie (on chart) which states pt is low risk for surgical procedure.  S/P right TKR 09/22/2018, discharged home the next day. Unfortunately, she fell at home and readmitted to [facility] 7/4 with and a fractured right distal femur, dislocation of the femoral component of the knee replacement. She underwent surgical revision  On 7/7. During this hospital admission, she developed hepatic encephalopathy, ammonia level was 66 which improved with lactulose. CT of head was negative. She was discharged to a SNF 09/30/2018.  Most recent admission 7/20-7/24/2020 [facility] per rectum which improved prior to discharge, iron deficiency anemia s/p 1 unit PRBC (hemogloben stable at discharge, 7.2, trending up 10.2 on most recent labs on chart), acute encephalopathy which resolved, decompensated cirrhosis s/p paracentesis 10/13/2018.  She was started on Lasix 40mg  po QD and Spironolactone 50mg  QD. Her hematochezia resolved after receiving Hydrocortisone 25mg  suppositories. A flexible sigmoidoscopy was not done.  EGD planned.   Followed up with GI 10/26/2018. Pt still experiencing nausea,  abdominal pain, red blood with BMs.  EGD and flex sigmoidoscopy scheduled, lasix, spironolactone, lactulose, and zofran continued.  These have not yet been done.    I have reached out to GI to see if they felt she is stable enough for GI workup tp wait until after knee surgery.  GI asked is she is still experiencing a GI bleed, which obviously we do not know without further workup.  No other input or information given from GI.  Discussed with anesthesiologist, Dr. Marcie Bal.  Based on recent admission and information from GI pt is not medically optimized for surgery due to unknown status of liver disease and possible active GI bleed with EGD and flex sigmoidoscopy pending.  Voicemail left with Dr. Aurea Graff scheduler.  Advised to contact GI for further recommendations.  Addendum 11/21/2018 3:19pm:  Per Dr. Aurea Graff scheduler this case is emergent. She states Dr. Alvan Dame can discuss with anesthesia if needed.  Dr. Marcie Bal informed.  VS: There were no vitals taken for this visit.  PROVIDERS: Rosalee Kaufman, PA-C is PCP   Quay Burow, MD is Cardiologist   Verne Grain, MD with [facility], last seen 10/26/2018 by Carl Best, NP   LABS: SDW (all labs ordered are listed, but only abnormal results are displayed)  Labs Reviewed - No data to display   IMAGES:   EKG: 10/11/2018 Rate 85 bpm Sinus rhythm  Borderline left axis deviation Low voltage, precordial leads Consider anterior infarct   CV: Echo 10/12/2018 IMPRESSIONS    1. The left ventricle has normal systolic function with an ejection fraction of 60-65%. The cavity size was normal. There is mildly increased left ventricular wall thickness.  Left ventricular diastolic Doppler parameters are consistent with impaired  relaxation. No evidence of left ventricular regional wall motion abnormalities.  2. The right ventricle has normal systolic function. The cavity was normal. There is no  increase in right ventricular wall thickness.  3. The aortic valve is tricuspid. Mild thickening of the aortic valve. Mild calcification of the aortic valve. Aortic valve regurgitation is mild by color flow Doppler. Mild stenosis of the aortic valve.  4. Peak aortic velocity: 2.53m/s, mean gradient 44mmHg.  5. The aorta is normal in size and structure. Past Medical History:  Diagnosis Date  . Acute and subacute hepatic failure without coma   . Allergy   . Anasarca   . Anxiety   . Arthritis   . Ascites 09/2018   no SBP on 850 cc tap 10/13/18  . Cirrhosis of liver ([place]) ~ 2004   from NAFLD.  Dx ~ 2004.  Dr Dorcas Mcmurray at [facility]/    . Depression   . Diabetes mellitus without complication ([place])    type 2  . Eczema of both hands   . Endometrial cancer ([place]) 1996, 2003   hysterectomy 1996.  recurrence 2003, treated with radiation.    . Generalized edema   . GI hemorrhage   . Headache   . 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 in [place]  . Hyperlipidemia   . Hypertension   . Muscle weakness   . Seizures ([place])    last seizure June 07, 2014   . Thrombocytopenia ([place])   . Thyroid nodule     Past Surgical History:  Procedure Laterality Date  . CATARACT EXTRACTION W/PHACO Left 06/04/2017   Procedure: CATARACT EXTRACTION PHACO AND INTRAOCULAR LENS PLACEMENT (IOC);  Surgeon: Baruch Goldmann, MD;  Location[facility];  Service: Ophthalmology;  Laterality: Left;  CDE: 3.90  . CATARACT EXTRACTION W/PHACO Right 07/15/2017   Procedure: CATARACT EXTRACTION PHACO AND INTRAOCULAR LENS PLACEMENT (IOC);  Surgeon: Baruch Goldmann, MD;  [facility];  Service: Ophthalmology;  Laterality: Right;  CDE: 7.60  . COLONOSCOPY  06/2007   in [place]. for hematochzia.  radiation proctitis  . DILATION AND CURETTAGE OF UTERUS    . ESOPHAGOGASTRODUODENOSCOPY  2016  . IR PARACENTESIS  10/13/2018  . TOTAL ABDOMINAL HYSTERECTOMY  1996  . TOTAL KNEE  ARTHROPLASTY Right 09/22/2018   Procedure: TOTAL KNEE ARTHROPLASTY;  Surgeon: Paralee Cancel, MD;  [facility];  Service: Orthopedics;  Laterality: Right;  70 mins  . TOTAL KNEE REVISION Right 09/27/2018   Procedure: TOTAL KNEE REVISION;  Surgeon: Paralee Cancel, MD;  [facility];  Service: Orthopedics;  Laterality: Right;    MEDICATIONS: No current facility-administered medications for this encounter.    [name] ALPRAZolam (XANAX) 0.25 MG tablet  . atenolol (TENORMIN) 50 MG tablet  . atorvastatin (LIPITOR) 80 MG tablet  . buPROPion (WELLBUTRIN XL) 150 MG 24 hr tablet  . celecoxib (CELEBREX) 200 MG capsule  . citalopram (CELEXA) 40 MG tablet  . dextrose (GLUCO BURST) 40 % GEL  . diltiazem (CARTIA XT) 180 MG 24 hr capsule  . fluticasone (FLONASE) 50 MCG/ACT nasal spray  . furosemide (LASIX) 40 MG tablet  . glipiZIDE (GLUCOTROL) 5 MG tablet  . glucagon (GLUCAGON EMERGENCY) 1 MG injection  . HYDROcodone-acetaminophen (NORCO/VICODIN) 5-325 MG tablet  . insulin glargine (LANTUS) 100 UNIT/ML injection  . loratadine (CLARITIN) 10 MG tablet  . montelukast (SINGULAIR) 10 MG tablet  . ondansetron (ZOFRAN) 4 MG  tablet  . potassium chloride (K-DUR) 10 MEQ tablet  . rifaximin (XIFAXAN) 550 MG TABS tablet  . sitaGLIPtin-metformin (JANUMET) 50-500 MG per tablet  . spironolactone (ALDACTONE) 50 MG tablet  . ferrous sulfate 325 (65 FE) MG tablet     Maia Plan [facility] Pre-Surgical Testing [phone] 11/21/18  2:27 PM

## 2018-11-21 NOTE — Anesthesia Preprocedure Evaluation (Addendum)
Anesthesia Evaluation  Patient identified by MRN, date of birth, ID band Patient awake    Reviewed: Allergy & Precautions, NPO status , Patient's Chart, lab work & pertinent test results, reviewed documented beta blocker date and time   History of Anesthesia Complications Negative for: history of anesthetic complications  Airway Mallampati: II  TM Distance: >3 FB Neck ROM: Full    Dental  (+) Dental Advisory Given   Pulmonary COPD,  COPD inhaler,  11/22/2018 SARS coronavirus neg   breath sounds clear to auscultation       Cardiovascular hypertension, Pt. on medications and Pt. on home beta blockers (-) angina Rhythm:Regular Rate:Normal  09/2018 ECHO: EF 60-65%, Mild calcification of the aortic valve. AI is mild by color flow Doppler. Mild AS, with a calculated valve area of 1.54 cm. Peak aortic velocity: 2.73ms, mean gradient 150mg, mild TR   Neuro/Psych  Headaches, Seizures -, Well Controlled,  Anxiety Depression    GI/Hepatic (+) Cirrhosis  (non-alcoholic fatty liver disease)  ascites    , Recent GI bleed   Endo/Other  diabetes (glu 188), Oral Hypoglycemic Agents  Renal/GU Renal InsufficiencyRenal disease     Musculoskeletal  (+) Arthritis ,   Abdominal   Peds  Hematology H/o thrombocytopenia and elevated PT/INR   Anesthesia Other Findings   Reproductive/Obstetrics H/o endometrial cancer                         Anesthesia Physical Anesthesia Plan  ASA: III  Anesthesia Plan: General   Post-op Pain Management: GA combined w/ Regional for post-op pain   Induction: Intravenous  PONV Risk Score and Plan: 3 and Ondansetron and Dexamethasone  Airway Management Planned: Oral ETT  Additional Equipment:   Intra-op Plan:   Post-operative Plan: Extubation in OR  Informed Consent: I have reviewed the patients History and Physical, chart, labs and discussed the procedure including the  risks, benefits and alternatives for the proposed anesthesia with the patient or authorized representative who has indicated his/her understanding and acceptance.     Dental advisory given  Plan Discussed with: CRNA and Surgeon  Anesthesia Plan Comments: (See PAT note 11/21/2018, JeKonrad FelixPA-C)       Anesthesia Quick Evaluation

## 2018-11-22 ENCOUNTER — Encounter (HOSPITAL_COMMUNITY): Payer: Self-pay | Admitting: *Deleted

## 2018-11-22 ENCOUNTER — Inpatient Hospital Stay (HOSPITAL_COMMUNITY): Payer: Medicare Other | Admitting: Physician Assistant

## 2018-11-22 ENCOUNTER — Telehealth (HOSPITAL_COMMUNITY): Payer: Self-pay | Admitting: *Deleted

## 2018-11-22 ENCOUNTER — Telehealth: Payer: Self-pay | Admitting: Internal Medicine

## 2018-11-22 ENCOUNTER — Inpatient Hospital Stay (HOSPITAL_COMMUNITY)
Admission: RE | Admit: 2018-11-22 | Discharge: 2018-12-07 | DRG: 502 | Disposition: A | Discharge status: Skilled Nursing Facility | Payer: Medicare Other | Attending: Orthopedic Surgery | Admitting: Orthopedic Surgery

## 2018-11-22 ENCOUNTER — Other Ambulatory Visit: Payer: Self-pay

## 2018-11-22 ENCOUNTER — Encounter (HOSPITAL_COMMUNITY)
Admission: RE | Disposition: A | Discharge status: Skilled Nursing Facility | Payer: Self-pay | Source: Home / Self Care | Attending: Orthopedic Surgery

## 2018-11-22 DIAGNOSIS — S76111A Strain of right quadriceps muscle, fascia and tendon, initial encounter: Secondary | ICD-10-CM | POA: Diagnosis not present

## 2018-11-22 DIAGNOSIS — Z7401 Bed confinement status: Secondary | ICD-10-CM | POA: Diagnosis not present

## 2018-11-22 DIAGNOSIS — S83094A Other dislocation of right patella, initial encounter: Secondary | ICD-10-CM | POA: Diagnosis not present

## 2018-11-22 DIAGNOSIS — Z9181 History of falling: Secondary | ICD-10-CM | POA: Diagnosis not present

## 2018-11-22 DIAGNOSIS — E663 Overweight: Secondary | ICD-10-CM | POA: Diagnosis present

## 2018-11-22 DIAGNOSIS — R41841 Cognitive communication deficit: Secondary | ICD-10-CM | POA: Diagnosis not present

## 2018-11-22 DIAGNOSIS — Z7984 Long term (current) use of oral hypoglycemic drugs: Secondary | ICD-10-CM

## 2018-11-22 DIAGNOSIS — Z923 Personal history of irradiation: Secondary | ICD-10-CM

## 2018-11-22 DIAGNOSIS — R5381 Other malaise: Secondary | ICD-10-CM | POA: Diagnosis not present

## 2018-11-22 DIAGNOSIS — D509 Iron deficiency anemia, unspecified: Secondary | ICD-10-CM | POA: Diagnosis present

## 2018-11-22 DIAGNOSIS — Z79899 Other long term (current) drug therapy: Secondary | ICD-10-CM | POA: Diagnosis not present

## 2018-11-22 DIAGNOSIS — K7460 Unspecified cirrhosis of liver: Secondary | ICD-10-CM | POA: Diagnosis present

## 2018-11-22 DIAGNOSIS — N823 Fistula of vagina to large intestine: Secondary | ICD-10-CM | POA: Diagnosis not present

## 2018-11-22 DIAGNOSIS — W19XXXA Unspecified fall, initial encounter: Secondary | ICD-10-CM | POA: Diagnosis not present

## 2018-11-22 DIAGNOSIS — Z7951 Long term (current) use of inhaled steroids: Secondary | ICD-10-CM

## 2018-11-22 DIAGNOSIS — E119 Type 2 diabetes mellitus without complications: Secondary | ICD-10-CM | POA: Diagnosis not present

## 2018-11-22 DIAGNOSIS — Y792 Prosthetic and other implants, materials and accessory orthopedic devices associated with adverse incidents: Secondary | ICD-10-CM | POA: Diagnosis present

## 2018-11-22 DIAGNOSIS — J449 Chronic obstructive pulmonary disease, unspecified: Secondary | ICD-10-CM | POA: Diagnosis present

## 2018-11-22 DIAGNOSIS — K9189 Other postprocedural complications and disorders of digestive system: Secondary | ICD-10-CM

## 2018-11-22 DIAGNOSIS — D696 Thrombocytopenia, unspecified: Secondary | ICD-10-CM | POA: Diagnosis present

## 2018-11-22 DIAGNOSIS — N3021 Other chronic cystitis with hematuria: Secondary | ICD-10-CM | POA: Diagnosis not present

## 2018-11-22 DIAGNOSIS — R9389 Abnormal findings on diagnostic imaging of other specified body structures: Secondary | ICD-10-CM

## 2018-11-22 DIAGNOSIS — M79672 Pain in left foot: Secondary | ICD-10-CM | POA: Diagnosis not present

## 2018-11-22 DIAGNOSIS — K582 Mixed irritable bowel syndrome: Secondary | ICD-10-CM | POA: Diagnosis present

## 2018-11-22 DIAGNOSIS — M6281 Muscle weakness (generalized): Secondary | ICD-10-CM | POA: Diagnosis not present

## 2018-11-22 DIAGNOSIS — F329 Major depressive disorder, single episode, unspecified: Secondary | ICD-10-CM | POA: Diagnosis present

## 2018-11-22 DIAGNOSIS — Z90710 Acquired absence of both cervix and uterus: Secondary | ICD-10-CM

## 2018-11-22 DIAGNOSIS — F419 Anxiety disorder, unspecified: Secondary | ICD-10-CM | POA: Diagnosis not present

## 2018-11-22 DIAGNOSIS — E785 Hyperlipidemia, unspecified: Secondary | ICD-10-CM | POA: Diagnosis present

## 2018-11-22 DIAGNOSIS — K7581 Nonalcoholic steatohepatitis (NASH): Secondary | ICD-10-CM | POA: Diagnosis present

## 2018-11-22 DIAGNOSIS — M67863 Other specified disorders of tendon, right knee: Secondary | ICD-10-CM | POA: Diagnosis present

## 2018-11-22 DIAGNOSIS — R143 Flatulence: Secondary | ICD-10-CM | POA: Diagnosis not present

## 2018-11-22 DIAGNOSIS — K5900 Constipation, unspecified: Secondary | ICD-10-CM | POA: Diagnosis not present

## 2018-11-22 DIAGNOSIS — R531 Weakness: Secondary | ICD-10-CM | POA: Diagnosis not present

## 2018-11-22 DIAGNOSIS — Z87440 Personal history of urinary (tract) infections: Secondary | ICD-10-CM

## 2018-11-22 DIAGNOSIS — N3041 Irradiation cystitis with hematuria: Secondary | ICD-10-CM | POA: Diagnosis not present

## 2018-11-22 DIAGNOSIS — K7290 Hepatic failure, unspecified without coma: Secondary | ICD-10-CM | POA: Diagnosis not present

## 2018-11-22 DIAGNOSIS — I10 Essential (primary) hypertension: Secondary | ICD-10-CM | POA: Diagnosis present

## 2018-11-22 DIAGNOSIS — N898 Other specified noninflammatory disorders of vagina: Secondary | ICD-10-CM

## 2018-11-22 DIAGNOSIS — M2550 Pain in unspecified joint: Secondary | ICD-10-CM | POA: Diagnosis not present

## 2018-11-22 DIAGNOSIS — Z8542 Personal history of malignant neoplasm of other parts of uterus: Secondary | ICD-10-CM

## 2018-11-22 DIAGNOSIS — Z4789 Encounter for other orthopedic aftercare: Secondary | ICD-10-CM | POA: Diagnosis not present

## 2018-11-22 DIAGNOSIS — F339 Major depressive disorder, recurrent, unspecified: Secondary | ICD-10-CM | POA: Diagnosis not present

## 2018-11-22 DIAGNOSIS — Z791 Long term (current) use of non-steroidal anti-inflammatories (NSAID): Secondary | ICD-10-CM

## 2018-11-22 DIAGNOSIS — R933 Abnormal findings on diagnostic imaging of other parts of digestive tract: Secondary | ICD-10-CM | POA: Diagnosis not present

## 2018-11-22 DIAGNOSIS — R310 Gross hematuria: Secondary | ICD-10-CM | POA: Diagnosis not present

## 2018-11-22 DIAGNOSIS — R319 Hematuria, unspecified: Secondary | ICD-10-CM | POA: Diagnosis not present

## 2018-11-22 DIAGNOSIS — Z1159 Encounter for screening for other viral diseases: Secondary | ICD-10-CM | POA: Diagnosis not present

## 2018-11-22 DIAGNOSIS — M66261 Spontaneous rupture of extensor tendons, right lower leg: Secondary | ICD-10-CM | POA: Diagnosis present

## 2018-11-22 DIAGNOSIS — Z20828 Contact with and (suspected) exposure to other viral communicable diseases: Secondary | ICD-10-CM | POA: Diagnosis not present

## 2018-11-22 DIAGNOSIS — Z0000 Encounter for general adult medical examination without abnormal findings: Secondary | ICD-10-CM | POA: Diagnosis not present

## 2018-11-22 DIAGNOSIS — R29898 Other symptoms and signs involving the musculoskeletal system: Secondary | ICD-10-CM | POA: Diagnosis present

## 2018-11-22 DIAGNOSIS — M79671 Pain in right foot: Secondary | ICD-10-CM | POA: Diagnosis not present

## 2018-11-22 DIAGNOSIS — Z8249 Family history of ischemic heart disease and other diseases of the circulatory system: Secondary | ICD-10-CM

## 2018-11-22 DIAGNOSIS — G40909 Epilepsy, unspecified, not intractable, without status epilepticus: Secondary | ICD-10-CM | POA: Diagnosis not present

## 2018-11-22 DIAGNOSIS — T84092A Other mechanical complication of internal right knee prosthesis, initial encounter: Secondary | ICD-10-CM | POA: Diagnosis not present

## 2018-11-22 DIAGNOSIS — Z833 Family history of diabetes mellitus: Secondary | ICD-10-CM | POA: Diagnosis not present

## 2018-11-22 DIAGNOSIS — R2689 Other abnormalities of gait and mobility: Secondary | ICD-10-CM | POA: Diagnosis not present

## 2018-11-22 DIAGNOSIS — K567 Ileus, unspecified: Secondary | ICD-10-CM | POA: Diagnosis not present

## 2018-11-22 DIAGNOSIS — R262 Difficulty in walking, not elsewhere classified: Secondary | ICD-10-CM | POA: Diagnosis present

## 2018-11-22 DIAGNOSIS — Z6829 Body mass index (BMI) 29.0-29.9, adult: Secondary | ICD-10-CM | POA: Diagnosis not present

## 2018-11-22 DIAGNOSIS — G8918 Other acute postprocedural pain: Secondary | ICD-10-CM | POA: Diagnosis not present

## 2018-11-22 DIAGNOSIS — R110 Nausea: Secondary | ICD-10-CM

## 2018-11-22 DIAGNOSIS — K6289 Other specified diseases of anus and rectum: Secondary | ICD-10-CM | POA: Diagnosis not present

## 2018-11-22 DIAGNOSIS — E1140 Type 2 diabetes mellitus with diabetic neuropathy, unspecified: Secondary | ICD-10-CM | POA: Diagnosis not present

## 2018-11-22 DIAGNOSIS — Z01818 Encounter for other preprocedural examination: Secondary | ICD-10-CM

## 2018-11-22 HISTORY — DX: Muscle weakness (generalized): M62.81

## 2018-11-22 HISTORY — DX: Acute and subacute hepatic failure without coma: K72.00

## 2018-11-22 HISTORY — DX: Generalized edema: R60.1

## 2018-11-22 HISTORY — PX: TOTAL KNEE REVISION: SHX996

## 2018-11-22 HISTORY — DX: Gastrointestinal hemorrhage, unspecified: K92.2

## 2018-11-22 LAB — PREPARE RBC (CROSSMATCH)

## 2018-11-22 LAB — URINALYSIS, ROUTINE W REFLEX MICROSCOPIC

## 2018-11-22 LAB — POCT I-STAT 4, (NA,K, GLUC, HGB,HCT)
Glucose, Bld: 189 mg/dL — ABNORMAL HIGH (ref 70–99)
HCT: 39 % (ref 36.0–46.0)
Hemoglobin: 13.3 g/dL (ref 12.0–15.0)
Potassium: 4.7 mmol/L (ref 3.5–5.1)
Sodium: 132 mmol/L — ABNORMAL LOW (ref 135–145)

## 2018-11-22 LAB — BASIC METABOLIC PANEL
Anion gap: 17 — ABNORMAL HIGH (ref 5–15)
BUN: 19 mg/dL (ref 8–23)
CO2: 19 mmol/L — ABNORMAL LOW (ref 22–32)
Calcium: 9.4 mg/dL (ref 8.9–10.3)
Chloride: 99 mmol/L (ref 98–111)
Creatinine, Ser: 1.24 mg/dL — ABNORMAL HIGH (ref 0.44–1.00)
GFR calc Af Amer: 51 mL/min — ABNORMAL LOW (ref >60)
GFR calc non Af Amer: 44 mL/min — ABNORMAL LOW (ref >60)
Glucose, Bld: 182 mg/dL — ABNORMAL HIGH (ref 70–99)
Potassium: 3.4 mmol/L — ABNORMAL LOW (ref 3.5–5.1)
Sodium: 135 mmol/L (ref 135–145)

## 2018-11-22 LAB — CBC
HCT: 35.8 % — ABNORMAL LOW (ref 36.0–46.0)
Hemoglobin: 12.2 g/dL (ref 12.0–15.0)
MCH: 30.7 pg (ref 26.0–34.0)
MCHC: 34.1 g/dL (ref 30.0–36.0)
MCV: 90.2 fL (ref 80.0–100.0)
Platelets: 97 10*3/uL — ABNORMAL LOW (ref 150–400)
RBC: 3.97 MIL/uL (ref 3.87–5.11)
RDW: 15.9 % — ABNORMAL HIGH (ref 11.5–15.5)
WBC: 5.5 10*3/uL (ref 4.0–10.5)
nRBC: 0 % (ref 0.0–0.2)

## 2018-11-22 LAB — URINALYSIS, MICROSCOPIC (REFLEX)
RBC / HPF: >50 RBC/hpf (ref 0–5)
WBC, UA: >50 WBC/hpf (ref 0–5)

## 2018-11-22 LAB — GLUCOSE, CAPILLARY
Glucose-Capillary: 188 mg/dL — ABNORMAL HIGH (ref 70–99)
Glucose-Capillary: 182 mg/dL — ABNORMAL HIGH (ref 70–99)
Glucose-Capillary: 218 mg/dL — ABNORMAL HIGH (ref 70–99)

## 2018-11-22 LAB — SARS CORONAVIRUS 2 BY RT PCR (HOSPITAL ORDER, PERFORMED IN ~~LOC~~ HOSPITAL LAB): SARS Coronavirus 2: NEGATIVE

## 2018-11-22 SURGERY — TOTAL KNEE REVISION
Anesthesia: General | Site: Knee | Laterality: Right

## 2018-11-22 MED ORDER — FENTANYL CITRATE (PF) 100 MCG/2ML IJ SOLN
INTRAMUSCULAR | Status: AC
  Administered 2018-11-22: 25 ug via INTRAVENOUS
  Filled 2018-11-22: qty 2

## 2018-11-22 MED ORDER — CHLORHEXIDINE GLUCONATE CLOTH 2 % EX PADS
6.0000 | MEDICATED_PAD | Freq: Once | CUTANEOUS | Status: AC
  Administered 2018-11-22: 6 via TOPICAL

## 2018-11-22 MED ORDER — CEFAZOLIN SODIUM-DEXTROSE 2-4 GM/100ML-% IV SOLN
2.0000 g | Freq: Four times a day (QID) | INTRAVENOUS | Status: AC
  Administered 2018-11-22 – 2018-11-23 (×2): 2 g via INTRAVENOUS
  Filled 2018-11-22 (×2): qty 100

## 2018-11-22 MED ORDER — POTASSIUM CHLORIDE CRYS ER 10 MEQ PO TBCR
10.0000 meq | EXTENDED_RELEASE_TABLET | Freq: Every day | ORAL | Status: DC
  Administered 2018-11-22 – 2018-12-07 (×16): 10 meq via ORAL
  Filled 2018-11-22 (×16): qty 1

## 2018-11-22 MED ORDER — METOCLOPRAMIDE HCL 5 MG PO TABS: 5.0000 mg | ORAL_TABLET | Freq: Three times a day (TID) | ORAL | Status: DC | PRN

## 2018-11-22 MED ORDER — BUPIVACAINE-EPINEPHRINE 0.5% -1:200000 IJ SOLN
INTRAMUSCULAR | Status: DC | PRN
  Administered 2018-11-22: 30 mL

## 2018-11-22 MED ORDER — SODIUM CHLORIDE 0.9 % IR SOLN
Status: DC | PRN
  Administered 2018-11-22: 1000 mL

## 2018-11-22 MED ORDER — FENTANYL CITRATE (PF) 100 MCG/2ML IJ SOLN
25.0000 ug | Freq: Once | INTRAMUSCULAR | Status: AC
  Administered 2018-11-22: 15:00:00 25 ug via INTRAVENOUS

## 2018-11-22 MED ORDER — ONDANSETRON HCL 4 MG/2ML IJ SOLN
INTRAMUSCULAR | Status: AC
  Filled 2018-11-22: qty 2

## 2018-11-22 MED ORDER — INSULIN GLARGINE 100 UNIT/ML ~~LOC~~ SOLN
65.0000 [IU] | Freq: Every day | SUBCUTANEOUS | Status: DC
  Administered 2018-11-23 – 2018-12-07 (×13): 65 [IU] via SUBCUTANEOUS
  Filled 2018-11-22 (×15): qty 0.65

## 2018-11-22 MED ORDER — MIDAZOLAM HCL 2 MG/2ML IJ SOLN: 1.0000 mg | INTRAMUSCULAR | Status: DC

## 2018-11-22 MED ORDER — ALPRAZOLAM 0.25 MG PO TABS
0.2500 mg | ORAL_TABLET | Freq: Two times a day (BID) | ORAL | Status: DC
  Administered 2018-11-22 – 2018-12-07 (×30): 0.25 mg via ORAL
  Filled 2018-11-22 (×30): qty 1

## 2018-11-22 MED ORDER — SODIUM CHLORIDE (PF) 0.9 % IJ SOLN
INTRAMUSCULAR | Status: DC | PRN
  Administered 2018-11-22: 30 mL

## 2018-11-22 MED ORDER — PROPOFOL 10 MG/ML IV BOLUS
INTRAVENOUS | Status: DC | PRN
  Administered 2018-11-22: 80 mg via INTRAVENOUS

## 2018-11-22 MED ORDER — LIDOCAINE 2% (20 MG/ML) 5 ML SYRINGE
INTRAMUSCULAR | Status: DC | PRN
  Administered 2018-11-22: 30 mg via INTRAVENOUS

## 2018-11-22 MED ORDER — ONDANSETRON HCL 4 MG/2ML IJ SOLN
4.0000 mg | Freq: Four times a day (QID) | INTRAMUSCULAR | Status: DC | PRN
  Administered 2018-12-05 (×2): 4 mg via INTRAVENOUS
  Filled 2018-11-22 (×2): qty 2

## 2018-11-22 MED ORDER — EPHEDRINE SULFATE-NACL 50-0.9 MG/10ML-% IV SOSY
PREFILLED_SYRINGE | INTRAVENOUS | Status: DC | PRN
  Administered 2018-11-22: 10 mg via INTRAVENOUS

## 2018-11-22 MED ORDER — MONTELUKAST SODIUM 10 MG PO TABS
10.0000 mg | ORAL_TABLET | Freq: Every day | ORAL | Status: DC
  Administered 2018-11-22 – 2018-12-06 (×15): 10 mg via ORAL
  Filled 2018-11-22 (×15): qty 1

## 2018-11-22 MED ORDER — KETOROLAC TROMETHAMINE 30 MG/ML IJ SOLN
INTRAMUSCULAR | Status: DC | PRN
  Administered 2018-11-22: 30 mg via INTRAVENOUS

## 2018-11-22 MED ORDER — FENTANYL CITRATE (PF) 100 MCG/2ML IJ SOLN
INTRAMUSCULAR | Status: DC | PRN
  Administered 2018-11-22: 50 ug via INTRAVENOUS
  Administered 2018-11-22: 25 ug via INTRAVENOUS
  Administered 2018-11-22: 50 ug via INTRAVENOUS
  Administered 2018-11-22: 25 ug via INTRAVENOUS

## 2018-11-22 MED ORDER — FERROUS SULFATE 325 (65 FE) MG PO TABS
325.0000 mg | ORAL_TABLET | Freq: Two times a day (BID) | ORAL | Status: DC
  Administered 2018-11-23 – 2018-12-07 (×24): 325 mg via ORAL
  Filled 2018-11-22 (×26): qty 1

## 2018-11-22 MED ORDER — SITAGLIPTIN PHOS-METFORMIN HCL 50-500 MG PO TABS: 1.0000 | ORAL_TABLET | Freq: Two times a day (BID) | ORAL | Status: DC

## 2018-11-22 MED ORDER — TRANEXAMIC ACID-NACL 1000-0.7 MG/100ML-% IV SOLN
1000.0000 mg | Freq: Once | INTRAVENOUS | Status: AC
  Administered 2018-11-22: 1000 mg via INTRAVENOUS
  Filled 2018-11-22: qty 100

## 2018-11-22 MED ORDER — DIPHENHYDRAMINE HCL 12.5 MG/5ML PO ELIX: 12.5000 mg | ORAL_SOLUTION | ORAL | Status: DC | PRN

## 2018-11-22 MED ORDER — BUPIVACAINE-EPINEPHRINE 0.5% -1:200000 IJ SOLN
INTRAMUSCULAR | Status: AC
  Filled 2018-11-22: qty 1

## 2018-11-22 MED ORDER — SPIRONOLACTONE 25 MG PO TABS
50.0000 mg | ORAL_TABLET | Freq: Every day | ORAL | Status: DC
  Administered 2018-11-22 – 2018-12-07 (×12): 50 mg via ORAL
  Filled 2018-11-22 (×16): qty 2

## 2018-11-22 MED ORDER — LORATADINE 10 MG PO TABS
10.0000 mg | ORAL_TABLET | Freq: Every day | ORAL | Status: DC
  Administered 2018-11-23 – 2018-12-07 (×14): 10 mg via ORAL
  Filled 2018-11-22 (×14): qty 1

## 2018-11-22 MED ORDER — INSULIN ASPART 100 UNIT/ML ~~LOC~~ SOLN
0.0000 [IU] | Freq: Three times a day (TID) | SUBCUTANEOUS | Status: DC
  Administered 2018-11-23 (×2): 8 [IU] via SUBCUTANEOUS
  Administered 2018-11-23: 11 [IU] via SUBCUTANEOUS
  Administered 2018-11-23 – 2018-11-24 (×3): 8 [IU] via SUBCUTANEOUS
  Administered 2018-11-24 – 2018-11-25 (×2): 3 [IU] via SUBCUTANEOUS
  Administered 2018-11-25: 2 [IU] via SUBCUTANEOUS
  Administered 2018-11-25: 5 [IU] via SUBCUTANEOUS
  Administered 2018-11-26: 2 [IU] via SUBCUTANEOUS
  Administered 2018-11-27: 3 [IU] via SUBCUTANEOUS
  Administered 2018-11-27: 12:00:00 8 [IU] via SUBCUTANEOUS
  Administered 2018-11-28: 5 [IU] via SUBCUTANEOUS
  Administered 2018-11-28: 08:00:00 3 [IU] via SUBCUTANEOUS
  Administered 2018-11-29: 18:00:00 5 [IU] via SUBCUTANEOUS
  Administered 2018-11-29 – 2018-12-01 (×3): 2 [IU] via SUBCUTANEOUS
  Administered 2018-12-03: 5 [IU] via SUBCUTANEOUS
  Administered 2018-12-03: 3 [IU] via SUBCUTANEOUS
  Administered 2018-12-03: 12:00:00 5 [IU] via SUBCUTANEOUS
  Administered 2018-12-04: 3 [IU] via SUBCUTANEOUS
  Administered 2018-12-04: 12:00:00 5 [IU] via SUBCUTANEOUS
  Administered 2018-12-05: 2 [IU] via SUBCUTANEOUS
  Administered 2018-12-05 – 2018-12-06 (×2): 3 [IU] via SUBCUTANEOUS
  Administered 2018-12-06: 08:00:00 2 [IU] via SUBCUTANEOUS
  Administered 2018-12-06: 3 [IU] via SUBCUTANEOUS

## 2018-11-22 MED ORDER — ONDANSETRON HCL 4 MG PO TABS: 4.0000 mg | ORAL_TABLET | Freq: Four times a day (QID) | ORAL | Status: DC | PRN

## 2018-11-22 MED ORDER — PHENOL 1.4 % MT LIQD
1.0000 | OROMUCOSAL | Status: DC | PRN
  Filled 2018-11-22: qty 177

## 2018-11-22 MED ORDER — ATENOLOL 50 MG PO TABS
50.0000 mg | ORAL_TABLET | Freq: Every day | ORAL | Status: DC
  Administered 2018-11-23 – 2018-12-07 (×15): 50 mg via ORAL
  Filled 2018-11-22 (×16): qty 1

## 2018-11-22 MED ORDER — LINAGLIPTIN 5 MG PO TABS
5.0000 mg | ORAL_TABLET | Freq: Every day | ORAL | Status: DC
  Administered 2018-11-23 – 2018-12-07 (×12): 5 mg via ORAL
  Filled 2018-11-22 (×13): qty 1

## 2018-11-22 MED ORDER — SODIUM CHLORIDE (PF) 0.9 % IJ SOLN
INTRAMUSCULAR | Status: AC
  Filled 2018-11-22: qty 50

## 2018-11-22 MED ORDER — DEXAMETHASONE SODIUM PHOSPHATE 10 MG/ML IJ SOLN
10.0000 mg | Freq: Once | INTRAMUSCULAR | Status: AC
  Administered 2018-11-23: 10 mg via INTRAVENOUS
  Filled 2018-11-22: qty 1

## 2018-11-22 MED ORDER — GLIPIZIDE 5 MG PO TABS
5.0000 mg | ORAL_TABLET | Freq: Two times a day (BID) | ORAL | Status: DC
  Administered 2018-11-23 – 2018-12-07 (×23): 5 mg via ORAL
  Filled 2018-11-22 (×24): qty 1

## 2018-11-22 MED ORDER — SUGAMMADEX SODIUM 500 MG/5ML IV SOLN
INTRAVENOUS | Status: DC | PRN
  Administered 2018-11-22: 210 mg via INTRAVENOUS

## 2018-11-22 MED ORDER — CHLORHEXIDINE GLUCONATE 4 % EX LIQD
60.0000 mL | Freq: Once | CUTANEOUS | Status: AC
  Administered 2018-11-22: 4 via TOPICAL

## 2018-11-22 MED ORDER — FLUTICASONE PROPIONATE 50 MCG/ACT NA SUSP
1.0000 | Freq: Every day | NASAL | Status: DC
  Administered 2018-11-23 – 2018-12-06 (×12): 1 via NASAL
  Filled 2018-11-22: qty 16

## 2018-11-22 MED ORDER — FENTANYL CITRATE (PF) 100 MCG/2ML IJ SOLN
25.0000 ug | INTRAMUSCULAR | Status: DC | PRN
  Administered 2018-11-22 (×2): 50 ug via INTRAVENOUS

## 2018-11-22 MED ORDER — ROCURONIUM BROMIDE 100 MG/10ML IV SOLN
INTRAVENOUS | Status: DC | PRN
  Administered 2018-11-22: 40 mg via INTRAVENOUS

## 2018-11-22 MED ORDER — SODIUM CHLORIDE 0.9 % IV SOLN
INTRAVENOUS | Status: DC
  Administered 2018-11-22 – 2018-11-24 (×4): via INTRAVENOUS

## 2018-11-22 MED ORDER — HYDROCODONE-ACETAMINOPHEN 7.5-325 MG PO TABS
1.0000 | ORAL_TABLET | ORAL | Status: DC | PRN
  Administered 2018-11-23 – 2018-11-30 (×11): 1 via ORAL
  Administered 2018-11-30 – 2018-12-02 (×6): 2 via ORAL
  Administered 2018-12-03 (×3): 1 via ORAL
  Administered 2018-12-03: 04:00:00 2 via ORAL
  Administered 2018-12-04 – 2018-12-06 (×4): 1 via ORAL
  Filled 2018-11-22: qty 1
  Filled 2018-11-22 (×2): qty 2
  Filled 2018-11-22 (×2): qty 1
  Filled 2018-11-22: qty 2
  Filled 2018-11-22: qty 1
  Filled 2018-11-22: qty 2
  Filled 2018-11-22: qty 1
  Filled 2018-11-22 (×2): qty 2
  Filled 2018-11-22 (×4): qty 1
  Filled 2018-11-22 (×2): qty 2
  Filled 2018-11-22 (×2): qty 1
  Filled 2018-11-22: qty 2
  Filled 2018-11-22 (×3): qty 1
  Filled 2018-11-22: qty 2
  Filled 2018-11-22: qty 1
  Filled 2018-11-22: qty 2
  Filled 2018-11-22 (×4): qty 1

## 2018-11-22 MED ORDER — TRANEXAMIC ACID-NACL 1000-0.7 MG/100ML-% IV SOLN
1000.0000 mg | INTRAVENOUS | Status: AC
  Administered 2018-11-22: 1000 mg via INTRAVENOUS
  Filled 2018-11-22: qty 100

## 2018-11-22 MED ORDER — FUROSEMIDE 40 MG PO TABS
40.0000 mg | ORAL_TABLET | Freq: Every day | ORAL | Status: DC
  Administered 2018-11-24 – 2018-12-07 (×8): 40 mg via ORAL
  Filled 2018-11-22 (×14): qty 1

## 2018-11-22 MED ORDER — BISACODYL 10 MG RE SUPP: 10.0000 mg | Freq: Every day | RECTAL | Status: DC | PRN

## 2018-11-22 MED ORDER — METHOCARBAMOL 500 MG IVPB - SIMPLE MED
500.0000 mg | Freq: Four times a day (QID) | INTRAVENOUS | Status: DC | PRN
  Administered 2018-11-22 – 2018-12-02 (×2): 500 mg via INTRAVENOUS
  Filled 2018-11-22 (×2): qty 50
  Filled 2018-11-22: qty 500

## 2018-11-22 MED ORDER — MENTHOL 3 MG MT LOZG: 1.0000 | LOZENGE | OROMUCOSAL | Status: DC | PRN

## 2018-11-22 MED ORDER — DILTIAZEM HCL ER COATED BEADS 180 MG PO CP24
180.0000 mg | ORAL_CAPSULE | Freq: Every day | ORAL | Status: DC
  Administered 2018-11-23 – 2018-12-07 (×15): 180 mg via ORAL
  Filled 2018-11-22 (×15): qty 1

## 2018-11-22 MED ORDER — DOCUSATE SODIUM 100 MG PO CAPS
100.0000 mg | ORAL_CAPSULE | Freq: Two times a day (BID) | ORAL | Status: DC
  Administered 2018-11-22 – 2018-12-07 (×15): 100 mg via ORAL
  Filled 2018-11-22 (×24): qty 1

## 2018-11-22 MED ORDER — FENTANYL CITRATE (PF) 250 MCG/5ML IJ SOLN
INTRAMUSCULAR | Status: AC
  Filled 2018-11-22: qty 5

## 2018-11-22 MED ORDER — CEFAZOLIN SODIUM-DEXTROSE 2-4 GM/100ML-% IV SOLN
2.0000 g | INTRAVENOUS | Status: AC
  Administered 2018-11-22: 2 g via INTRAVENOUS
  Filled 2018-11-22: qty 100

## 2018-11-22 MED ORDER — MIDAZOLAM HCL 2 MG/2ML IJ SOLN: 0.5000 mg | Freq: Once | INTRAMUSCULAR | Status: DC | PRN

## 2018-11-22 MED ORDER — LACTATED RINGERS IV SOLN
INTRAVENOUS | Status: DC
  Administered 2018-11-22: 14:00:00 via INTRAVENOUS

## 2018-11-22 MED ORDER — METHOCARBAMOL 500 MG IVPB - SIMPLE MED
INTRAVENOUS | Status: AC
  Filled 2018-11-22: qty 50

## 2018-11-22 MED ORDER — HYDROCODONE-ACETAMINOPHEN 5-325 MG PO TABS
1.0000 | ORAL_TABLET | ORAL | Status: DC | PRN
  Administered 2018-11-22: 1 via ORAL
  Administered 2018-11-23 – 2018-11-24 (×3): 2 via ORAL
  Administered 2018-11-24 – 2018-12-07 (×9): 1 via ORAL
  Filled 2018-11-22 (×4): qty 1
  Filled 2018-11-22: qty 2
  Filled 2018-11-22: qty 1
  Filled 2018-11-22: qty 2
  Filled 2018-11-22 (×6): qty 1
  Filled 2018-11-22: qty 2

## 2018-11-22 MED ORDER — MIDAZOLAM HCL 2 MG/2ML IJ SOLN
INTRAMUSCULAR | Status: AC
  Filled 2018-11-22: qty 2

## 2018-11-22 MED ORDER — CITALOPRAM HYDROBROMIDE 20 MG PO TABS
40.0000 mg | ORAL_TABLET | Freq: Every day | ORAL | Status: DC
  Administered 2018-11-23 – 2018-12-07 (×15): 40 mg via ORAL
  Filled 2018-11-22 (×15): qty 2

## 2018-11-22 MED ORDER — ALUM & MAG HYDROXIDE-SIMETH 200-200-20 MG/5ML PO SUSP
15.0000 mL | ORAL | Status: DC | PRN
  Administered 2018-12-05: 18:00:00 15 mL via ORAL
  Filled 2018-11-22 (×2): qty 30

## 2018-11-22 MED ORDER — CEFAZOLIN SODIUM-DEXTROSE 2-4 GM/100ML-% IV SOLN: 2.0000 g | INTRAVENOUS | Status: DC

## 2018-11-22 MED ORDER — METFORMIN HCL 500 MG PO TABS
500.0000 mg | ORAL_TABLET | Freq: Two times a day (BID) | ORAL | Status: DC
  Administered 2018-11-23 – 2018-12-07 (×23): 500 mg via ORAL
  Filled 2018-11-22 (×24): qty 1

## 2018-11-22 MED ORDER — ONDANSETRON HCL 4 MG/2ML IJ SOLN
INTRAMUSCULAR | Status: DC | PRN
  Administered 2018-11-22: 4 mg via INTRAVENOUS

## 2018-11-22 MED ORDER — POLYETHYLENE GLYCOL 3350 17 G PO PACK
17.0000 g | PACK | Freq: Two times a day (BID) | ORAL | Status: DC
  Administered 2018-11-23 – 2018-11-25 (×4): 17 g via ORAL
  Filled 2018-11-22 (×17): qty 1

## 2018-11-22 MED ORDER — ATORVASTATIN CALCIUM 40 MG PO TABS
80.0000 mg | ORAL_TABLET | Freq: Every day | ORAL | Status: DC
  Administered 2018-11-22 – 2018-12-06 (×15): 80 mg via ORAL
  Filled 2018-11-22 (×15): qty 2

## 2018-11-22 MED ORDER — FENTANYL CITRATE (PF) 100 MCG/2ML IJ SOLN
50.0000 ug | INTRAMUSCULAR | Status: DC
  Administered 2018-11-22: 50 ug via INTRAVENOUS

## 2018-11-22 MED ORDER — RIFAXIMIN 550 MG PO TABS
550.0000 mg | ORAL_TABLET | Freq: Two times a day (BID) | ORAL | Status: DC
  Administered 2018-11-22 – 2018-12-07 (×31): 550 mg via ORAL
  Filled 2018-11-22 (×30): qty 1

## 2018-11-22 MED ORDER — HYDROMORPHONE HCL 1 MG/ML IJ SOLN
0.5000 mg | INTRAMUSCULAR | Status: DC | PRN
  Administered 2018-11-30 (×2): 0.5 mg via INTRAVENOUS
  Administered 2018-12-02: 07:00:00 1 mg via INTRAVENOUS
  Filled 2018-11-22 (×3): qty 1

## 2018-11-22 MED ORDER — MEPERIDINE HCL 50 MG/ML IJ SOLN: 6.2500 mg | INTRAMUSCULAR | Status: DC | PRN

## 2018-11-22 MED ORDER — SODIUM CHLORIDE 0.9 % IV SOLN
INTRAVENOUS | Status: DC | PRN
  Administered 2018-11-22: 17:00:00 via INTRAVENOUS

## 2018-11-22 MED ORDER — SUGAMMADEX SODIUM 500 MG/5ML IV SOLN
INTRAVENOUS | Status: AC
  Filled 2018-11-22: qty 5

## 2018-11-22 MED ORDER — BUPROPION HCL ER (XL) 150 MG PO TB24
150.0000 mg | ORAL_TABLET | Freq: Every day | ORAL | Status: DC
  Administered 2018-11-23 – 2018-12-07 (×15): 150 mg via ORAL
  Filled 2018-11-22 (×15): qty 1

## 2018-11-22 MED ORDER — PROMETHAZINE HCL 25 MG/ML IJ SOLN: 6.2500 mg | INTRAMUSCULAR | Status: DC | PRN

## 2018-11-22 MED ORDER — ROPIVACAINE HCL 7.5 MG/ML IJ SOLN
INTRAMUSCULAR | Status: DC | PRN
  Administered 2018-11-22: 20 mL via PERINEURAL

## 2018-11-22 MED ORDER — DEXAMETHASONE SODIUM PHOSPHATE 10 MG/ML IJ SOLN
10.0000 mg | Freq: Once | INTRAMUSCULAR | Status: AC
  Administered 2018-11-22: 4 mg via INTRAVENOUS

## 2018-11-22 MED ORDER — FENTANYL CITRATE (PF) 100 MCG/2ML IJ SOLN
INTRAMUSCULAR | Status: AC
  Filled 2018-11-22: qty 4

## 2018-11-22 MED ORDER — LIDOCAINE 2% (20 MG/ML) 5 ML SYRINGE
INTRAMUSCULAR | Status: AC
  Filled 2018-11-22: qty 5

## 2018-11-22 MED ORDER — METOCLOPRAMIDE HCL 5 MG/ML IJ SOLN: 5.0000 mg | Freq: Three times a day (TID) | INTRAMUSCULAR | Status: DC | PRN

## 2018-11-22 MED ORDER — MAGNESIUM CITRATE PO SOLN: 1.0000 | Freq: Once | ORAL | Status: DC | PRN

## 2018-11-22 MED ORDER — ACETAMINOPHEN 325 MG PO TABS
325.0000 mg | ORAL_TABLET | Freq: Four times a day (QID) | ORAL | Status: DC | PRN
  Administered 2018-12-04: 650 mg via ORAL
  Filled 2018-11-22 (×2): qty 2

## 2018-11-22 MED ORDER — METHOCARBAMOL 500 MG PO TABS
500.0000 mg | ORAL_TABLET | Freq: Four times a day (QID) | ORAL | Status: DC | PRN
  Administered 2018-11-23 – 2018-12-07 (×20): 500 mg via ORAL
  Filled 2018-11-22 (×22): qty 1

## 2018-11-22 MED ORDER — RIVAROXABAN 10 MG PO TABS: 10.0000 mg | ORAL_TABLET | ORAL | Status: DC

## 2018-11-22 MED ORDER — KETOROLAC TROMETHAMINE 30 MG/ML IJ SOLN
INTRAMUSCULAR | Status: AC
  Filled 2018-11-22: qty 1

## 2018-11-22 SURGICAL SUPPLY — 60 items
BAG DECANTER FOR FLEXI CONT (MISCELLANEOUS) IMPLANT
BAG ZIPLOCK 12X15 (MISCELLANEOUS) IMPLANT
BLADE SAW SGTL 11.0X1.19X90.0M (BLADE) IMPLANT
BLADE SAW SGTL 13.0X1.19X90.0M (BLADE) ×2 IMPLANT
BLADE SAW SGTL 81X20 HD (BLADE) ×2 IMPLANT
BLADE SURG SZ10 CARB STEEL (BLADE) ×4 IMPLANT
BNDG ELASTIC 6X5.8 VLCR STR LF (GAUZE/BANDAGES/DRESSINGS) ×2 IMPLANT
BRUSH FEMORAL CANAL (MISCELLANEOUS) IMPLANT
COVER SURGICAL LIGHT HANDLE (MISCELLANEOUS) ×2 IMPLANT
COVER WAND RF STERILE (DRAPES) IMPLANT
CUFF TOURN SGL QUICK 34 (TOURNIQUET CUFF) ×1
CUFF TRNQT CYL 34X4.125X (TOURNIQUET CUFF) ×1 IMPLANT
DECANTER SPIKE VIAL GLASS SM (MISCELLANEOUS) IMPLANT
DERMABOND ADVANCED (GAUZE/BANDAGES/DRESSINGS) ×1
DERMABOND ADVANCED .7 DNX12 (GAUZE/BANDAGES/DRESSINGS) ×1 IMPLANT
DRAPE U-SHAPE 47X51 STRL (DRAPES) ×2 IMPLANT
DRESSING AQUACEL AG SP 3.5X10 (GAUZE/BANDAGES/DRESSINGS) IMPLANT
DRSG AQUACEL AG ADV 3.5X10 (GAUZE/BANDAGES/DRESSINGS) ×2 IMPLANT
DRSG AQUACEL AG ADV 3.5X14 (GAUZE/BANDAGES/DRESSINGS) ×2 IMPLANT
DRSG AQUACEL AG SP 3.5X10 (GAUZE/BANDAGES/DRESSINGS)
DURAPREP 26ML APPLICATOR (WOUND CARE) ×4 IMPLANT
ELECT REM PT RETURN 15FT ADLT (MISCELLANEOUS) ×2 IMPLANT
GAUZE SPONGE 2X2 8PLY STRL LF (GAUZE/BANDAGES/DRESSINGS) IMPLANT
GLOVE BIOGEL PI IND STRL 7.5 (GLOVE) ×1 IMPLANT
GLOVE BIOGEL PI IND STRL 8.5 (GLOVE) ×1 IMPLANT
GLOVE BIOGEL PI INDICATOR 7.5 (GLOVE) ×1
GLOVE BIOGEL PI INDICATOR 8.5 (GLOVE) ×1
GLOVE ECLIPSE 8.0 STRL XLNG CF (GLOVE) ×4 IMPLANT
GLOVE ORTHO TXT STRL SZ7.5 (GLOVE) ×2 IMPLANT
GOWN STRL REUS W/TWL 2XL LVL3 (GOWN DISPOSABLE) ×2 IMPLANT
GOWN STRL REUS W/TWL LRG LVL3 (GOWN DISPOSABLE) ×2 IMPLANT
GOWN STRL REUS W/TWL XL LVL3 (GOWN DISPOSABLE) IMPLANT
HANDPIECE INTERPULSE COAX TIP (DISPOSABLE) ×1
HOLDER FOLEY CATH W/STRAP (MISCELLANEOUS) IMPLANT
IMMOBILIZER KNEE 20 (SOFTGOODS) ×2
IMMOBILIZER KNEE 20 THIGH 36 (SOFTGOODS) ×1 IMPLANT
KIT TURNOVER KIT A (KITS) IMPLANT
MANIFOLD NEPTUNE II (INSTRUMENTS) ×2 IMPLANT
NDL SAFETY ECLIPSE 18X1.5 (NEEDLE) IMPLANT
NEEDLE HYPO 18GX1.5 SHARP (NEEDLE)
NS IRRIG 1000ML POUR BTL (IV SOLUTION) ×2 IMPLANT
PACK TOTAL KNEE CUSTOM (KITS) ×2 IMPLANT
PROTECTOR NERVE ULNAR (MISCELLANEOUS) ×2 IMPLANT
SET HNDPC FAN SPRY TIP SCT (DISPOSABLE) ×1 IMPLANT
SET PAD KNEE POSITIONER (MISCELLANEOUS) ×2 IMPLANT
SPONGE GAUZE 2X2 STER 10/PKG (GAUZE/BANDAGES/DRESSINGS)
STAPLER VISISTAT 35W (STAPLE) IMPLANT
SUT MNCRL AB 3-0 PS2 18 (SUTURE) ×2 IMPLANT
SUT STRATAFIX 1PDS 45CM VIOLET (SUTURE) ×4 IMPLANT
SUT STRATAFIX PDS+ 0 24IN (SUTURE) ×2 IMPLANT
SUT VIC AB 1 CT1 36 (SUTURE) ×4 IMPLANT
SUT VIC AB 2-0 CT1 27 (SUTURE) ×3
SUT VIC AB 2-0 CT1 TAPERPNT 27 (SUTURE) ×3 IMPLANT
SYR 50ML LL SCALE MARK (SYRINGE) IMPLANT
TOWER CARTRIDGE SMART MIX (DISPOSABLE) ×2 IMPLANT
TRAY FOLEY MTR SLVR 16FR STAT (SET/KITS/TRAYS/PACK) ×2 IMPLANT
TUBE KAMVAC SUCTION (TUBING) IMPLANT
WATER STERILE IRR 1000ML POUR (IV SOLUTION) ×2 IMPLANT
WRAP KNEE MAXI GEL POST OP (GAUZE/BANDAGES/DRESSINGS) ×2 IMPLANT
YANKAUER SUCT BULB TIP 10FT TU (MISCELLANEOUS) IMPLANT

## 2018-11-22 NOTE — Progress Notes (Signed)
AssistedDr. Carswell Jackson with right, ultrasound guided, adductor canal block. Side rails up, monitors on throughout procedure. See vital signs in flow sheet. Tolerated Procedure well.  

## 2018-11-22 NOTE — Telephone Encounter (Signed)
Pts daughter, [name], called me this evening requesting we come see pt ASAP for possible GIB.  Pt in house at [facility].  I explained to her we don't have privieges there.  Attending physician would need to contact GI there if needed.

## 2018-11-22 NOTE — Anesthesia Postprocedure Evaluation (Signed)
Anesthesia Post Note  Patient: Brittany Russo  Procedure(s) Performed: repair right knee extensor mechanism (Right Knee)     Patient location during evaluation: PACU Anesthesia Type: General Level of consciousness: sedated, oriented and patient cooperative Pain management: pain level controlled Vital Signs Assessment: post-procedure vital signs reviewed and stable Respiratory status: spontaneous breathing, nonlabored ventilation, respiratory function stable and patient connected to nasal cannula oxygen Cardiovascular status: blood pressure returned to baseline and stable Postop Assessment: no apparent nausea or vomiting Anesthetic complications: no    Last Vitals:  Vitals:   11/22/18 1815 11/22/18 1830  BP: (!) 153/79   Pulse: 66 61  Resp: 18 10  Temp:    SpO2: 100% 93%    Last Pain:  Vitals:   11/22/18 1830  TempSrc:   PainSc: Asleep                 Trinita Devlin,E. Yolunda Kloos

## 2018-11-22 NOTE — Anesthesia Procedure Notes (Addendum)
Procedure Name: Intubation Date/Time: 11/22/2018 4:22 PM Performed by: West Pugh, CRNA Pre-anesthesia Checklist: Patient identified, Emergency Drugs available, Suction available, Patient being monitored and Timeout performed Patient Re-evaluated:Patient Re-evaluated prior to induction Oxygen Delivery Method: Circle system utilized Preoxygenation: Pre-oxygenation with 100% oxygen Induction Type: IV induction Ventilation: Mask ventilation without difficulty Laryngoscope Size: Mac and 3 Grade View: Grade I Tube type: Oral Tube size: 7.0 mm Number of attempts: 1 Airway Equipment and Method: Stylet Placement Confirmation: ETT inserted through vocal cords under direct vision,  positive ETCO2,  CO2 detector and breath sounds checked- equal and bilateral Secured at: 20 cm Tube secured with: Tape Dental Injury: Teeth and Oropharynx as per pre-operative assessment

## 2018-11-22 NOTE — Anesthesia Procedure Notes (Signed)
Anesthesia Regional Block: Adductor canal block   Pre-Anesthetic Checklist: ,, timeout performed, Correct Patient, Correct Site, Correct Laterality, Correct Procedure, Correct Position, site marked, Risks and benefits discussed,  Surgical consent,  Pre-op evaluation,  At surgeon's request and post-op pain management  Laterality: Right and Lower  Prep: chloraprep       Needles:  Injection technique: Single-shot  Needle Type: Echogenic Needle     Needle Length: 9cm  Needle Gauge: 21     Additional Needles:   Procedures:,,,, ultrasound used (permanent image in chart),,,,  Narrative:  Start time: 11/22/2018 3:56 PM End time: 11/22/2018 4:03 PM Injection made incrementally with aspirations every 5 mL.  Performed by: Personally  Anesthesiologist: Annye Asa, MD  Additional Notes: Pt identified in Holding room.  Monitors applied. Working IV access confirmed. Sterile prep R thigh.  #21ga ECHOgenic needle into adductor canal with US guidance.  20cc 0.75% Ropivacaine injected incrementally after negative test dose.  Patient asymptomatic, VSS, no heme aspirated, tolerated well.  Jenita Seashore, MD

## 2018-11-22 NOTE — H&P (Signed)
Brittany Russo is an 73 y.o. female.    Chief Complaint: Concern for extensor mechanism disruption based on clinical exam findings and radiographs  Procedure:    Open right knee extensor mechanism repair versus reconstruction  HPI: Pt is a 73 y.o. female  7 weeks out from revision of right total knee arthroplasty to SROM hinged knee. She reports that she is doing fair. Her husband is with her in the office today. She is currently in a nursing facility. They have been trying to get her up and moving. She notes that she has significant discomfort with any leg lifts. She has continued to work with the therapists at [facility], but notes that she is quite discouraged with her progress. She has gotten up with the walker some, but her husband reports that she still needs to be supported with both a gait belt and a wheelchair behind her. She is in a wheelchair in the office.  This represents a significantly complicated and unfortunate event after a fall immediately postoperatively from her knee while at home. She basically sheared the distal condyles off of her femur requiring revision to the hinged component as noted.  There is concern for extensor mechanism disruption based on clinical exam findings and radiographs.  Potential options discussed. At this time, the treatment plan includes - knee immobilizer dispensed to keep her knee straight and prevent further blanching of the skin with concern of skin breakdown and infection. This would be catastrophic.  At this point I think it is imperative for [place] to get back to the operating room and to evaluate the extensor mechanism to see if there is any hopes of reestablishing no soft tissue based coverage of her component and very importantly trying to restore some function to the lower extremity. I warned the patient and her husband that this ultimately could result in an above-the-knee amputation if her functional aspect is severely limited or worse she  were to have skin breakdown or infection.  Given the nature of the patients symptoms and the failure of conservative treatments to provide relief, operative intervention was deemed necessary. The decision to proceed with operative intervention was ultimately made, given postoperative complication(s) and continued intractable pain. The procedure risks, benefits, side effects and alternatives of the left TKA revision were discussed at length with the patient. These risks include pain, infection, blood loss, injury to nearby structures including nerves and vessels, death, complications of anesthesia, need for further operations, cardiopulmonary complications and stiffness.   CP: Rosalee Kaufman, PA-C  D/C Plans:       SNF  Post-op Meds:       No Rx given   Tranexamic Acid:      To be given - IV   Decadron:      Is to be given  FYI:      [employer]co  DME:   Pt already has equipment  PT:   SNF    PMH: Past Medical History:  Diagnosis Date   Acute and subacute hepatic failure without coma    Allergy    Anasarca    Anxiety    Arthritis    Ascites 09/2018   no SBP on 850 cc tap 10/13/18   Cirrhosis of liver ([place]) ~ 2004   from NAFLD.  Dx ~ 2004.  Dr Dorcas Mcmurray at [facility]/     Depression    Diabetes mellitus without complication [facility])    type 2   Eczema of both  hands    Endometrial cancer ([place]) 1996, 2003   hysterectomy 1996.  recurrence 2003, treated with radiation.     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, DR Allyson Sabal Mir at [place] in [place]   Hyperlipidemia    Hypertension    Muscle weakness    Seizures ([place])    last seizure June 07, 2014    Thrombocytopenia [facility])    Thyroid nodule     PSH: Past Surgical History:  Procedure Laterality Date   CATARACT EXTRACTION W/PHACO Left 06/04/2017   Procedure: CATARACT EXTRACTION PHACO AND INTRAOCULAR LENS PLACEMENT (IOC);   Surgeon: Baruch Goldmann, MD;  Location: [facility];  Service: Ophthalmology;  Laterality: Left;  CDE: 3.90   CATARACT EXTRACTION W/PHACO Right 07/15/2017   Procedure: CATARACT EXTRACTION PHACO AND INTRAOCULAR LENS PLACEMENT (IOC);  Surgeon: Baruch Goldmann, MD;  Location: [facility];  Service: Ophthalmology;  Laterality: Right;  CDE: 7.60   COLONOSCOPY  06/2007   in [place]. for hematochzia.  radiation proctitis   DILATION AND CURETTAGE OF UTERUS     ESOPHAGOGASTRODUODENOSCOPY  2016   IR PARACENTESIS  10/13/2018   TOTAL ABDOMINAL HYSTERECTOMY  1996   TOTAL KNEE ARTHROPLASTY Right 09/22/2018   Procedure: TOTAL KNEE ARTHROPLASTY;  Surgeon: Paralee Cancel, MD;  [facility];  Service: Orthopedics;  Laterality: Right;  70 mins   TOTAL KNEE REVISION Right 09/27/2018   Procedure: TOTAL KNEE REVISION;  Surgeon: Paralee Cancel, MD;  [facility];  Service: Orthopedics;  Laterality: Right;    Social History:  reports that she has never smoked. She has never used smokeless tobacco. She reports that she does not drink alcohol or use drugs.  Allergies:  Allergies  Allergen Reactions   Asa [Aspirin] Other (See Comments)    Pt not allergic but cannot take due to bleeding    Other Other (See Comments)    Any jewelry metal-hives and itching     Medications: No current facility-administered medications for this encounter.    Current Outpatient Medications  Medication Sig Dispense Refill   ALPRAZolam (XANAX) 0.25 MG tablet Take 0.25 mg by mouth 2 (two) times daily.     atenolol (TENORMIN) 50 MG tablet Take 50 mg by mouth daily.     atorvastatin (LIPITOR) 80 MG tablet Take 80 mg by mouth daily at 8 pm.     buPROPion (WELLBUTRIN XL) 150 MG 24 hr tablet Take 150 mg by mouth daily.     celecoxib (CELEBREX) 200 MG capsule Take 1 capsule (200 mg total) by mouth 2 (two) times daily. 60 capsule 0   citalopram (CELEXA) 40 MG tablet Take 40 mg by mouth daily.     dextrose (GLUCO BURST) 40 % GEL Take 1  Tube by mouth once as needed (blood sugar less than 60).     diltiazem (CARTIA XT) 180 MG 24 hr capsule Take 180 mg by mouth daily.     fluticasone (FLONASE) 50 MCG/ACT nasal spray Place 1 spray into both nostrils daily.      furosemide (LASIX) 40 MG tablet Take 1 tablet (40 mg total) by mouth daily. 30 tablet 0   glipiZIDE (GLUCOTROL) 5 MG tablet Take 5 mg by mouth 2 (two) times daily.     glucagon (GLUCAGON EMERGENCY) 1 MG injection Inject 1 mg into the muscle once as needed (blood sugar less than 60).     HYDROcodone-acetaminophen (NORCO/VICODIN) 5-325 MG  tablet Take 1 tablet by mouth every 6 (six) hours as needed (PAIN).     insulin glargine (LANTUS) 100 UNIT/ML injection Inject 65 Units into the skin daily at 6 (six) AM.     loratadine (CLARITIN) 10 MG tablet Take 10 mg by mouth daily.     montelukast (SINGULAIR) 10 MG tablet Take 10 mg by mouth at bedtime.     ondansetron (ZOFRAN) 4 MG tablet Take 1 tablet (4 mg total) by mouth every 6 (six) hours as needed for nausea. 20 tablet 0   potassium chloride (K-DUR) 10 MEQ tablet Take 10 mEq by mouth daily.     rifaximin (XIFAXAN) 550 MG TABS tablet Take 550 mg by mouth 2 (two) times daily.     sitaGLIPtin-metformin (JANUMET) 50-500 MG per tablet Take 1 tablet by mouth 2 (two) times daily with a meal.     spironolactone (ALDACTONE) 50 MG tablet Take 1 tablet (50 mg total) by mouth daily. 30 tablet 0   ferrous sulfate 325 (65 FE) MG tablet Take 1 tablet (325 mg total) by mouth 2 (two) times daily with a meal for 14 days. (Patient not taking: Reported on 11/17/2018) 28 tablet 0      Review of Systems  Constitutional: Negative.   HENT: Negative.   Eyes: Negative.   Respiratory: Negative.   Cardiovascular: Negative.   Gastrointestinal: Positive for heartburn.  Genitourinary: Negative.   Musculoskeletal: Positive for joint pain.  Skin: Negative.   Neurological: Negative.   Endo/Heme/Allergies: Positive for environmental  allergies.  Psychiatric/Behavioral: Positive for depression. The patient is nervous/anxious.        Physical Exam  Constitutional: She is oriented to person, place, and time. She appears well-developed.  HENT:  Head: Normocephalic.  Eyes: Pupils are equal, round, and reactive to light.  Neck: Neck supple. No JVD present. No tracheal deviation present. No thyromegaly present.  Cardiovascular: Normal rate, regular rhythm and intact distal pulses.  Respiratory: Effort normal and breath sounds normal. No respiratory distress. She has no wheezes.  GI: Soft. There is no abdominal tenderness. There is no guarding.  Musculoskeletal:     Right knee: She exhibits decreased range of motion, swelling, abnormal patellar mobility and bony tenderness. She exhibits no ecchymosis, no laceration (healed previous incision) and no erythema. Tenderness found.  Lymphadenopathy:    She has no cervical adenopathy.  Neurological: She is alert and oriented to person, place, and time.  Skin: Skin is warm and dry.  Psychiatric: She has a normal mood and affect.       Assessment/Plan Assessment:   Concern for extensor mechanism disruption based on clinical exam findings and radiographs  Plan: Patient will undergo a open right knee extensor mechanism repair versus reconstruction on 11/22/2018 per Dr. Alvan Dame at [facility]. Risks benefits and expectations were discussed with the patient. Patient understand risks, benefits and expectations and wishes to proceed.   West Pugh Ladiamond Gallina   PA-C  11/22/2018, 10:12 AM

## 2018-11-22 NOTE — Discharge Instructions (Addendum)

## 2018-11-22 NOTE — Care Plan (Signed)
Ortho Bundle Case Management Note  Patient Details  Name: Brittany Russo MRN: [id number] Date of Birth: 1945/07/31  R Knee extension mechanism repair vs reconstruction scheduled 11-23-18 DCP:  Return to [facility]. DME:  No needs.                    DME Arranged:  N/A DME Agency:  NA  HH Arranged:  NA HH Agency:  NA  Additional Comments: Please contact me with any questions of if this plan should need to change.  Marianne Sofia, RN,CCM [facility]  [phone] 11/22/2018, 4:12 PM

## 2018-11-22 NOTE — Interval H&P Note (Signed)
History and Physical Interval Note:  11/22/2018 1:34 PM  [place]  has presented today for surgery, with the diagnosis of Status post revision right total knee with extensor mechanisum disruption.  The various methods of treatment have been discussed with the patient and family. After consideration of risks, benefits and other options for treatment, the patient has consented to  Procedure(s) with comments: Open right knee extensor mechanism repair versus reconstruction (Right) - 90 mins as a surgical intervention.  The patient's history has been reviewed, patient examined, no change in status, stable for surgery.  I have reviewed the patient's chart and labs.  Questions were answered to the patient's satisfaction.     [name]

## 2018-11-22 NOTE — Transfer of Care (Signed)
Immediate Anesthesia Transfer of Care Note  Patient: Brittany Russo  Procedure(s) Performed: repair right knee extensor mechanism (Right Knee)  Patient Location: PACU  Anesthesia Type:GA combined with regional for post-op pain  Level of Consciousness: awake, alert  and patient cooperative  Airway & Oxygen Therapy: Patient Spontanous Breathing and Patient connected to face mask oxygen  Post-op Assessment: Report given to RN and Post -op Vital signs reviewed and stable  Post vital signs: Reviewed and stable  Last Vitals:  Vitals Value Taken Time  BP 145/68 11/22/18 1811  Temp 36.6 C 11/22/18 1811  Pulse 67 11/22/18 1812  Resp 17 11/22/18 1812  SpO2 100 % 11/22/18 1812  Vitals shown include unvalidated device data.  Last Pain:  Vitals:   11/22/18 1811  TempSrc:   PainSc: (P) Asleep      Patients Stated Pain Goal: 4 ([id number])  Complications: No apparent anesthesia complications

## 2018-11-23 ENCOUNTER — Encounter (HOSPITAL_COMMUNITY): Payer: Self-pay | Admitting: Orthopedic Surgery

## 2018-11-23 DIAGNOSIS — E663 Overweight: Secondary | ICD-10-CM | POA: Diagnosis present

## 2018-11-23 DIAGNOSIS — R319 Hematuria, unspecified: Secondary | ICD-10-CM | POA: Clinically undetermined

## 2018-11-23 LAB — GLUCOSE, CAPILLARY
Glucose-Capillary: 286 mg/dL — ABNORMAL HIGH (ref 70–99)
Glucose-Capillary: 289 mg/dL — ABNORMAL HIGH (ref 70–99)
Glucose-Capillary: 258 mg/dL — ABNORMAL HIGH (ref 70–99)
Glucose-Capillary: 318 mg/dL — ABNORMAL HIGH (ref 70–99)
Glucose-Capillary: 257 mg/dL — ABNORMAL HIGH (ref 70–99)

## 2018-11-23 LAB — CBC
HCT: 33.4 % — ABNORMAL LOW (ref 36.0–46.0)
Hemoglobin: 10.8 g/dL — ABNORMAL LOW (ref 12.0–15.0)
MCH: 30.3 pg (ref 26.0–34.0)
MCHC: 32.3 g/dL (ref 30.0–36.0)
MCV: 93.8 fL (ref 80.0–100.0)
Platelets: 59 10*3/uL — ABNORMAL LOW (ref 150–400)
RBC: 3.56 MIL/uL — ABNORMAL LOW (ref 3.87–5.11)
RDW: 15.8 % — ABNORMAL HIGH (ref 11.5–15.5)
WBC: 4.1 10*3/uL (ref 4.0–10.5)
nRBC: 0 % (ref 0.0–0.2)

## 2018-11-23 LAB — BASIC METABOLIC PANEL
Anion gap: 10 (ref 5–15)
BUN: 21 mg/dL (ref 8–23)
CO2: 22 mmol/L (ref 22–32)
Calcium: 8.5 mg/dL — ABNORMAL LOW (ref 8.9–10.3)
Chloride: 102 mmol/L (ref 98–111)
Creatinine, Ser: 1.13 mg/dL — ABNORMAL HIGH (ref 0.44–1.00)
GFR calc Af Amer: 57 mL/min — ABNORMAL LOW (ref >60)
GFR calc non Af Amer: 49 mL/min — ABNORMAL LOW (ref >60)
Glucose, Bld: 269 mg/dL — ABNORMAL HIGH (ref 70–99)
Potassium: 4.1 mmol/L (ref 3.5–5.1)
Sodium: 134 mmol/L — ABNORMAL LOW (ref 135–145)

## 2018-11-23 NOTE — Plan of Care (Signed)
Plan of care reviewed and discussed with the patient. 

## 2018-11-23 NOTE — Progress Notes (Addendum)
     Subjective: 1 Day Post-Op Procedure(s) (LRB): repair right knee extensor mechanism (Right)   Patient reports pain as mild, pain controlled.  No reported events throughout the night.  Once patient was given Foley it was determined that she had some hematuria and we will call for a urology consult today for evaluation.  T ROM brace was ordered to be locked in extension.  It was stressed to the patient that she cannot bend this knee to give it any chance of healing.  Expectations for a couple days in the hospital prior to being safe to be discharged.  Patient most likely will to return to facility as she does not have adequate resources at home to safely take care of her.   Objective:   VITALS:   Vitals:   11/23/18 0111 11/23/18 0521  BP: (!) 111/56 (!) 122/59  Pulse: 68 73  Resp: 16 14  Temp: 97.6 F (36.4 C) (!) 97.4 F (36.3 C)  SpO2: 99% 100%    Incision: dressing C/D/I No cellulitis present Compartment soft  LABS Recent Labs    11/22/18 1407 11/22/18 1437 11/23/18 0227  HGB 13.3 12.2 10.8*  HCT 39.0 35.8* 33.4*  WBC  --  5.5 4.1  PLT  --  97* 59*    Recent Labs    11/22/18 1407 11/22/18 1437 11/23/18 0227  NA 132* 135 134*  K 4.7 3.4* 4.1  BUN  --  19 21  CREATININE  --  1.24* 1.13*  GLUCOSE 189* 182* 269*     Assessment/Plan: 1 Day Post-Op Procedure(s) (LRB): repair right knee extensor mechanism (Right) No pharmaceutical anticoagulation due to hematuria and concern for GI bleeding. Mechanical with TED hoses and SCDs.  Bledsoe brace order, to be locked in extension. Advance diet Up with therapy Discharge to SNF eventually, when ready Social work consult ordered  Hematuria Urology consult ordered  Overweight (BMI 25-29.9) Estimated body mass index is 29.46 kg/m as calculated from the following:   Height as of this encounter: 5\' 2"  (1.575 m).   Weight as of this encounter: 73.1 kg. Patient also counseled that weight may inhibit the healing  process Patient counseled that losing weight will help with future health issues      [place] Pugh. Koleman Marling   PAC  11/23/2018, 8:46 AM

## 2018-11-23 NOTE — Consult Note (Signed)
Urology Consult   Physician requesting consult: Dr. Paralee Cancel  Reason for consult: Gross hematuria  History of Present Illness: Brittany Russo is a 73 y.o. female postop day 1 from revision of right total knee arthroplasty.  The patient is noted to have gross painless hematuria.  She was seen in our office last in 2017 by Dr. Louis Meckel for radiation induced gross hematuria.  Below are his notes from that visit:  The patient states that she had a hysterectomy in 1996. This was for abnormal uterine bleeding. Evidently, she then developed an extrauterine paravaginal tumor which was diagnosed as endometrial cancer. This was treated with surgical extirpation and radiation. 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 She has had dysuria, frequency, and urgency in the past. This is the first time she has noted gross hematuria. She was seen and evaluated in the emergency department at [facility] on 03/25/15 and her workup was unremarkable including a CT scan with and without contrast. Her urine did demonstrate gross hematuria, there was no evidence of infection. There are no kidney stones or renal masses noted. The bladder was otherwise unremarkable.   She states that over the past year or so she has had intermittent gross painless hematuria.  She has been treated for urinary tract infections as well, although it seems like she is relatively asymptomatic except for this hematuria at the time of her infections.  Most recent abdominal ultrasound performed within the past month revealed normal-appearing kidneys without stones, hydronephrosis, cyst or tumors.    Past Medical History:  Diagnosis Date  . Acute and subacute hepatic failure without coma   . Allergy   . Anasarca   . Anxiety   . Arthritis   . Ascites 09/2018   no SBP on 850 cc tap 10/13/18  . Cirrhosis of liver ([place]) ~ 2004   from NAFLD.  Dx ~ 2004.  Dr Dorcas Mcmurray at [facility]/    . Depression   . Diabetes  mellitus without complication ([place])    type 2  . Eczema of both hands   . Endometrial cancer ([place]) 1996, 2003   hysterectomy 1996.  recurrence 2003, treated with radiation.    . Generalized edema   . GI hemorrhage   . Headache   . 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 in [place]  . Hyperlipidemia   . Hypertension   . Muscle weakness   . Seizures ([place])    last seizure June 07, 2014   . Thrombocytopenia ([place])   . Thyroid nodule     Past Surgical History:  Procedure Laterality Date  . CATARACT EXTRACTION W/PHACO Left 06/04/2017   Procedure: CATARACT EXTRACTION PHACO AND INTRAOCULAR LENS PLACEMENT (IOC);  Surgeon: Baruch Goldmann, MD;  Location: [facility];  Service: Ophthalmology;  Laterality: Left;  CDE: 3.90  . CATARACT EXTRACTION W/PHACO Right 07/15/2017   Procedure: CATARACT EXTRACTION PHACO AND INTRAOCULAR LENS PLACEMENT (IOC);  Surgeon: Baruch Goldmann, MD;  Location: [facility];  Service: Ophthalmology;  Laterality: Right;  CDE: 7.60  . COLONOSCOPY  06/2007   in [place]. for hematochzia.  radiation proctitis  . DILATION AND CURETTAGE OF UTERUS    . ESOPHAGOGASTRODUODENOSCOPY  2016  . IR PARACENTESIS  10/13/2018  . TOTAL ABDOMINAL HYSTERECTOMY  1996  . TOTAL KNEE ARTHROPLASTY Right 09/22/2018   Procedure: TOTAL KNEE ARTHROPLASTY;  Surgeon: Paralee Cancel, MD;  Location: [facility];  Service: Orthopedics;  Laterality: Right;  70 mins  .  TOTAL KNEE REVISION Right 09/27/2018   Procedure: TOTAL KNEE REVISION;  Surgeon: Paralee Cancel, MD;  Location: [facility];  Service: Orthopedics;  Laterality: Right;  . TOTAL KNEE REVISION Right 11/22/2018   Procedure: repair right knee extensor mechanism;  Surgeon: Paralee Cancel, MD;  Location: [facility];  Service: Orthopedics;  Laterality: Right;  90 mins     Current Hospital Medications: Scheduled Meds: . ALPRAZolam  0.25 mg Oral BID  . atenolol  50 mg Oral Daily  . atorvastatin  80 mg Oral Q2000  .  buPROPion  150 mg Oral Daily  . citalopram  40 mg Oral Daily  . diltiazem  180 mg Oral Daily  . docusate sodium  100 mg Oral BID  . ferrous sulfate  325 mg Oral BID WC  . fluticasone  1 spray Each Nare Daily  . furosemide  40 mg Oral Daily  . glipiZIDE  5 mg Oral BID AC  . insulin aspart  0-15 Units Subcutaneous TID WC  . insulin glargine  65 Units Subcutaneous Daily  . linagliptin  5 mg Oral Q breakfast  . loratadine  10 mg Oral Daily  . metFORMIN  500 mg Oral BID WC  . montelukast  10 mg Oral QHS  . polyethylene glycol  17 g Oral BID  . potassium chloride  10 mEq Oral Daily  . rifaximin  550 mg Oral BID  . spironolactone  50 mg Oral Daily   Continuous Infusions: . sodium chloride 75 mL/hr at 11/23/18 1505  . methocarbamol (ROBAXIN) IV 500 mg (11/22/18 1816)   PRN Meds:.acetaminophen, alum & mag hydroxide-simeth, bisacodyl, diphenhydrAMINE, HYDROcodone-acetaminophen, HYDROcodone-acetaminophen, HYDROmorphone (DILAUDID) injection, magnesium citrate, menthol-cetylpyridinium **OR** phenol, methocarbamol **OR** methocarbamol (ROBAXIN) IV, metoCLOPramide **OR** metoCLOPramide (REGLAN) injection, ondansetron **OR** ondansetron (ZOFRAN) IV  Allergies:  Allergies  Allergen Reactions  . Asa [Aspirin] Other (See Comments)    Pt not allergic but cannot take due to bleeding   . Other Other (See Comments)    Any jewelry metal-hives and itching     Family History  Problem Relation Age of Onset  . Heart disease Mother   . Diabetes Mother   . Heart disease Father   . Cancer Father   . Cancer Maternal Grandmother   . Heart disease Maternal Grandfather     Social History:  reports that she has never smoked. She has never used smokeless tobacco. She reports that she does not drink alcohol or use drugs.  ROS: A complete review of systems was performed.  All systems are negative except for pertinent findings as noted.  Physical Exam:  Vital signs in last 24 hours: Temp:  [97.4 F (36.3  C)-98.4 F (36.9 C)] 97.7 F (36.5 C) (09/02 1336) Pulse Rate:  [54-73] 54 (09/02 1448) Resp:  [10-18] 18 (09/02 1336) BP: (98-153)/(47-79) 102/49 (09/02 1448) SpO2:  [93 %-100 %] 97 % (09/02 1448) General:  Alert and oriented, No acute distress HEENT: Normocephalic, atraumatic Neck: No JVD or lymphadenopathy Cardiovascular: Regular rate and rhythm Lungs: Normal inspiratory/expiratory excursion Abdomen: Soft, nontender, nondistended, no abdominal masses Neurologic: Grossly intact  Laboratory Data:  Recent Labs    11/22/18 1407 11/22/18 1437 11/23/18 0227  WBC  --  5.5 4.1  HGB 13.3 12.2 10.8*  HCT 39.0 35.8* 33.4*  PLT  --  97* 59*    Recent Labs    11/22/18 1407 11/22/18 1437 11/23/18 0227  NA 132* 135 134*  K 4.7 3.4* 4.1  CL  --  99 102  GLUCOSE 189* 182* 269*  BUN  --  19 21  CALCIUM  --  9.4 8.5*  CREATININE  --  1.24* 1.13*     Results for orders placed or performed during the hospital encounter of 11/22/18 (from the past 24 hour(s))  Glucose, capillary     Status: Abnormal   Collection Time: 11/22/18  6:43 PM  Result Value Ref Range   Glucose-Capillary 182 (H) 70 - 99 mg/dL  Glucose, capillary     Status: Abnormal   Collection Time: 11/22/18  9:59 PM  Result Value Ref Range   Glucose-Capillary 218 (H) 70 - 99 mg/dL  CBC     Status: Abnormal   Collection Time: 11/23/18  2:27 AM  Result Value Ref Range   WBC 4.1 4.0 - 10.5 K/uL   RBC 3.56 (L) 3.87 - 5.11 MIL/uL   Hemoglobin 10.8 (L) 12.0 - 15.0 g/dL   HCT 33.4 (L) 36.0 - 46.0 %   MCV 93.8 80.0 - 100.0 fL   MCH 30.3 26.0 - 34.0 pg   MCHC 32.3 30.0 - 36.0 g/dL   RDW 15.8 (H) 11.5 - 15.5 %   Platelets 59 (L) 150 - 400 K/uL   nRBC 0.0 0.0 - 0.2 %  Basic metabolic panel     Status: Abnormal   Collection Time: 11/23/18  2:27 AM  Result Value Ref Range   Sodium 134 (L) 135 - 145 mmol/L   Potassium 4.1 3.5 - 5.1 mmol/L   Chloride 102 98 - 111 mmol/L   CO2 22 22 - 32 mmol/L   Glucose, Bld 269 (H) 70  - 99 mg/dL   BUN 21 8 - 23 mg/dL   Creatinine, Ser 1.13 (H) 0.44 - 1.00 mg/dL   Calcium 8.5 (L) 8.9 - 10.3 mg/dL   GFR calc non Af Amer 49 (L) >60 mL/min   GFR calc Af Amer 57 (L) >60 mL/min   Anion gap 10 5 - 15  Glucose, capillary     Status: Abnormal   Collection Time: 11/23/18  7:53 AM  Result Value Ref Range   Glucose-Capillary 286 (H) 70 - 99 mg/dL   Comment 1 Notify RN    Comment 2 Document in Chart   Glucose, capillary     Status: Abnormal   Collection Time: 11/23/18 12:04 PM  Result Value Ref Range   Glucose-Capillary 289 (H) 70 - 99 mg/dL   Comment 1 Notify RN    Comment 2 Document in Chart   Glucose, capillary     Status: Abnormal   Collection Time: 11/23/18  4:09 PM  Result Value Ref Range   Glucose-Capillary 258 (H) 70 - 99 mg/dL   Comment 1 Notify RN    Comment 2 Document in Chart    Recent Results (from the past 240 hour(s))  SARS Coronavirus 2 [facility] order, Performed in [facility] hospital lab) Nasopharyngeal Nasopharyngeal Swab     Status: None   Collection Time: 11/22/18  1:20 PM   Specimen: Nasopharyngeal Swab  Result Value Ref Range Status   SARS Coronavirus 2 NEGATIVE NEGATIVE Final    Comment: (NOTE) If result is NEGATIVE SARS-CoV-2 target nucleic acids are NOT DETECTED. The SARS-CoV-2 RNA is generally detectable in upper and lower  respiratory specimens during the acute phase of infection. The lowest  concentration of SARS-CoV-2 viral copies this assay can detect is 250  copies / mL. A negative result does not preclude SARS-CoV-2 infection  and should not be used as the  sole basis for treatment or other  patient management decisions.  A negative result may occur with  improper specimen collection / handling, submission of specimen other  than nasopharyngeal swab, presence of viral mutation(s) within the  areas targeted by this assay, and inadequate number of viral copies  (<250 copies / mL). A negative result must be combined with clinical   observations, patient history, and epidemiological information. If result is POSITIVE SARS-CoV-2 target nucleic acids are DETECTED. The SARS-CoV-2 RNA is generally detectable in upper and lower  respiratory specimens dur ing the acute phase of infection.  Positive  results are indicative of active infection with SARS-CoV-2.  Clinical  correlation with patient history and other diagnostic information is  necessary to determine patient infection status.  Positive results do  not rule out bacterial infection or co-infection with other viruses. If result is PRESUMPTIVE POSTIVE SARS-CoV-2 nucleic acids MAY BE PRESENT.   A presumptive positive result was obtained on the submitted specimen  and confirmed on repeat testing.  While 2019 novel coronavirus  (SARS-CoV-2) nucleic acids may be present in the submitted sample  additional confirmatory testing may be necessary for epidemiological  and / or clinical management purposes  to differentiate between  SARS-CoV-2 and other Sarbecovirus currently known to infect humans.  If clinically indicated additional testing with an alternate test  methodology [phone]) is advised. The SARS-CoV-2 RNA is generally  detectable in upper and lower respiratory sp ecimens during the acute  phase of infection. The expected result is Negative. Fact Sheet for Patients:  StrictlyIdeas.no Fact Sheet for Healthcare Providers: BankingDealers.co.za This test is not yet approved or cleared by the [place] FDA and has been authorized for detection and/or diagnosis of SARS-CoV-2 by FDA under an Emergency Use Authorization (EUA).  This EUA will remain in effect (meaning this test can be used) for the duration of the COVID-19 declaration under Section 564(b)(1) of the Act, 21 U.S.C. section 360bbb-3(b)(1), unless the authorization is terminated or revoked sooner. Performed at [facility], [place]  [address] [place], [place] [address]     Renal Function: Recent Labs    11/22/18 1437 11/23/18 0227  CREATININE 1.24* 1.13*   Estimated Creatinine Clearance: 43.4 mL/min (A) (by C-G formula based on SCr of 1.13 mg/dL (H)).  Radiologic Imaging: No results found.  I independently reviewed the above imaging studies.  Impression/Assessment:  Gross hematuria, longstanding, most likely related to radiation induced cystitis/hemorrhage.  At this point, I do not think that this is significant in terms of her losing blood/needing transfusion.  Plan:  She does need follow-up for this.  I think it is fine to remove her catheter at any point.  This may actually exacerbate this hematuria.  We will notify the patient as an outpatient to come back for proper follow-up/cystoscopy.  The patient agrees to this.

## 2018-11-23 NOTE — TOC Initial Note (Addendum)
Transition of Care [facility]) - Initial/Assessment Note    Patient Details  Name: Brittany Russo MRN: [id number] Date of Birth: Jan 03, 1946  Transition of Care [facility]) CM/SW Contact:    Lia Hopping, [place] Phone Number: 11/23/2018, 11:18 AM  Clinical Narrative:    Patient admitted for open knee extensor mechanism repair versus reconstruction.               Patient is currently at [facility] for rehab. She reports it has been a challenge working with PT but she is motivated to continue to do so. Patient will return to [facility]. CSW confirm bed with admit coordinator [name].  FL2 complete    Barriers to Discharge: Continued Medical Workup    Patient Goals and CMS Choice Patient states their goals for this hospitalization and ongoing recovery are:: to get better CMS Medicare.gov Compare Post Acute Care list provided to:: Patient Choice offered to / list presented to : Patient  Expected Discharge Plan and Services   In-house Referral: Clinical Social Work   Post Acute Care Choice: [place] Living arrangements for the past 2 months: [place] Expected Discharge Date: 11/24/18               DME Arranged: N/A DME Agency: NA       HH Arranged: NA [name] Agency: NA        Prior Living Arrangements/Services Living arrangements for the past 2 months: [place] Lives with:: Spouse Patient language and need for interpreter reviewed:: No        Need for Family Participation in Patient Care: Yes (Comment) Care giver support system in place?: Yes (comment)   Criminal Activity/Legal Involvement Pertinent to Current Situation/Hospitalization: No - Comment as needed  Activities of Daily Living Home Assistive Devices/Equipment: Wheelchair, Eyeglasses ADL Screening (condition at time of admission) Patient's cognitive ability adequate to safely complete daily activities?: Yes Is the patient deaf or have difficulty hearing?: No Does  the patient have difficulty seeing, even when wearing glasses/contacts?: No Does the patient have difficulty concentrating, remembering, or making decisions?: No Patient able to express need for assistance with ADLs?: Yes Does the patient have difficulty dressing or bathing?: No Independently performs ADLs?: No Communication: Independent Dressing (OT): Needs assistance Is this a change from baseline?: Pre-admission baseline Grooming: Independent Feeding: Independent Bathing: Needs assistance Is this a change from baseline?: Pre-admission baseline Toileting: Needs assistance Is this a change from baseline?: Pre-admission baseline In/Out Bed: Needs assistance Is this a change from baseline?: Pre-admission baseline Walks in Home: Dependent Is this a change from baseline?: Pre-admission baseline Does the patient have difficulty walking or climbing stairs?: Yes Weakness of Legs: Right Weakness of Arms/Hands: None  Permission Sought/Granted      Share Information with NAME: [name], [name]  Permission granted to share info w AGENCY: SNF  Permission granted to share info w Relationship: Spouse  Permission granted to share info w Contact Information: [phone], [phone]  Emotional Assessment Appearance:: Appears stated age Attitude/Demeanor/Rapport: Engaged Affect (typically observed): Accepting, Calm Orientation: : Oriented to Self, Oriented to Place, Oriented to Situation Alcohol / Substance Use: Not Applicable Psych Involvement: No (comment)  Admission diagnosis:  Status post revision right total knee with extensor mechanisum disruption Patient Active Problem List   Diagnosis Date Noted  . Hematuria 11/23/2018  . Overweight (BMI 25.0-29.9) 11/23/2018  . Mechanical problem with extremity 11/22/2018  . Cirrhosis of liver with ascites ([place]) 11/09/2018  . Nausea without vomiting 10/26/2018  . Ascites   .  Rectal bleeding   . AKI (acute kidney injury) ([place]) 10/11/2018  .  Lower GI bleed 10/10/2018  . Anasarca 10/10/2018  . Hypoalbuminemia 10/10/2018  . Depression 09/28/2018  . Acute hepatic encephalopathy 09/28/2018  . Cirrhosis ([place]) 09/28/2018  . Periprosthetic fracture around internal prosthetic right knee joint 09/24/2018  . S/P right TKA 09/22/2018  . Status post total knee replacement, right 09/22/2018  . DM2 (diabetes mellitus, type 2) ([place]) 07/11/2015  . Hypertension 07/11/2015  . Hyperlipidemia 07/11/2015   PCP:  [facility]:   [facility] [address], [place] [address] Phone: [phone] Fax: [phone]     Social Determinants of Health (SDOH) Interventions    Readmission Risk Interventions No flowsheet data found.

## 2018-11-23 NOTE — Evaluation (Signed)
Physical Therapy Evaluation Patient Details Name: Brittany Russo MRN: [id number] DOB: [birth year] Today's Date: 11/23/2018   History of Present Illness  Pt is a 73 y.o. female with admissions 10/10/18 for lower GI bleed and 09/22/18 for R TKA with subsequent fall resulting in readmission with R distal femur fx s/p TKA repair; also with PMH includes HTN, DM2, obesity.  Pt admitted for repair right knee extensor mechanism on 11/22/18.  Clinical Impression  Patient is s/p above surgery resulting in functional limitations due to the deficits listed below (see PT Problem List).  Patient will benefit from skilled PT to increase their independence and safety with mobility to allow discharge to the venue listed below.  Pt wearing bledsoe brace locked in extension on arrival.  Pt assisted with sitting EOB however reports back pain and very anxious about knee bending despite reassurance of brace and therapist assisting.  Pt plans to return to SNF for rehab.     Follow Up Recommendations SNF;Supervision/Assistance - 24 hour    Equipment Recommendations  None recommended by PT    Recommendations for Other Services       Precautions / Restrictions Precautions Precautions: Fall;Knee Precaution Comments: No ROM to R knee Required Braces or Orthoses: Other Brace Knee Immobilizer - Right: On at all times Other Brace: bledsoe brace locked in extension Restrictions Weight Bearing Restrictions: Yes RLE Weight Bearing: Partial weight bearing RLE Partial Weight Bearing Percentage or Pounds: 50%      Mobility  Bed Mobility Overal bed mobility: Needs Assistance Bed Mobility: Supine to Sit;Sit to Supine     Supine to sit: Total assist;+2 for physical assistance Sit to supine: +2 for physical assistance;Total assist   General bed mobility comments: verbal cues for assist, pt attempting however presents with generalized weakness, R LE supported throughout and pt remained in bledsoe brace  Transfers                 General transfer comment: pt unable  Ambulation/Gait                Stairs            Wheelchair Mobility    Modified Rankin (Stroke Patients Only)       Balance                                             Pertinent Vitals/Pain Pain Assessment: Faces Faces Pain Scale: Hurts even more Pain Location: L knee and back Pain Descriptors / Indicators: Sore Pain Intervention(s): Repositioned;Monitored during session    Home Living Family/patient expects to be discharged to:: Skilled nursing facility                 Additional Comments: Recent d/c to [facility] and plans to return    Prior Function           Comments: pt reports she was mobilizing at SNF in rehab until knee extensor issue discovered and has been awaiting surgery     Hand Dominance        Extremity/Trunk Assessment   Upper Extremity Assessment Upper Extremity Assessment: Generalized weakness    Lower Extremity Assessment Lower Extremity Assessment: Generalized weakness RLE Deficits / Details: knee in brace locked in extension, pt able to DF RLE: Unable to fully assess due to immobilization       Communication   Communication:  No difficulties  Cognition Arousal/Alertness: Awake/alert Behavior During Therapy: Anxious Overall Cognitive Status: Within Functional Limits for tasks assessed                                 General Comments: pt very anxious about knee, reassured brace locked in extension and therapist supported during bed mobility      General Comments      Exercises     Assessment/Plan    PT Assessment Patient needs continued PT services  PT Problem List Decreased strength;Decreased range of motion;Decreased activity tolerance;Decreased balance;Pain;Obesity;Decreased mobility;Decreased cognition;Decreased knowledge of use of DME;Decreased safety awareness       PT Treatment Interventions DME  instruction;Gait training;Stair training;Functional mobility training;Therapeutic activities;Therapeutic exercise;Patient/family education    PT Goals (Current goals can be found in the Care Plan section)  Acute Rehab PT Goals PT Goal Formulation: With patient Time For Goal Achievement: 12/07/18 Potential to Achieve Goals: Good    Frequency Min 3X/week   Barriers to discharge        Co-evaluation               AM-PAC PT "6 Clicks" Mobility  Outcome Measure Help needed turning from your back to your side while in a flat bed without using bedrails?: Total Help needed moving from lying on your back to sitting on the side of a flat bed without using bedrails?: Total Help needed moving to and from a bed to a chair (including a wheelchair)?: Total Help needed standing up from a chair using your arms (e.g., wheelchair or bedside chair)?: Total Help needed to walk in hospital room?: Total Help needed climbing 3-5 steps with a railing? : Total 6 Click Score: 6    End of Session Equipment Utilized During Treatment: Other (comment)(bledsoe brace) Activity Tolerance: Patient limited by pain Patient left: with call bell/phone within reach;in bed;with bed alarm set   PT Visit Diagnosis: Muscle weakness (generalized) (M62.81);Other abnormalities of gait and mobility (R26.89)    Time: 1355-1411 PT Time Calculation (min) (ACUTE ONLY): 16 min   Charges:   PT Evaluation $PT Eval Low Complexity: [place], PT, DPT Acute Rehabilitation Services Office: [phone] Pager: [phone]  Trena Platt 11/23/2018, 3:46 PM

## 2018-11-23 NOTE — NC FL2 (Addendum)
[place] LEVEL OF CARE SCREENING TOOL     IDENTIFICATION  Patient Name: Brittany Russo Birthdate: 06-28-1945 Sex: female Admission Date (Current Location): 11/22/2018  [facility] and [place] Number:  Herbalist and Address:  [facility],  [place], [place]      Provider Number: [id number]  Attending Physician Name and Address:  Paralee Cancel, MD  Relative Name and Phone Number:    Brittany Russo    [phone]      Current Level of Care: Hospital Recommended Level of Care: [place] Prior Approval Number:    Date Approved/Denied:   PASRR Number:   [id number]  Discharge Plan: SNF    Current Diagnoses: Patient Active Problem List   Diagnosis Date Noted  . Hematuria 11/23/2018  . Overweight (BMI 25.0-29.9) 11/23/2018  . Mechanical problem with extremity 11/22/2018  . Cirrhosis of liver with ascites ([place]) 11/09/2018  . Nausea without vomiting 10/26/2018  . Ascites   . Rectal bleeding   . AKI (acute kidney injury) ([place]) 10/11/2018  . Lower GI bleed 10/10/2018  . Anasarca 10/10/2018  . Hypoalbuminemia 10/10/2018  . Depression 09/28/2018  . Acute hepatic encephalopathy 09/28/2018  . Cirrhosis ([place]) 09/28/2018  . Periprosthetic fracture around internal prosthetic right knee joint 09/24/2018  . S/P right TKA 09/22/2018  . Status post total knee replacement, right 09/22/2018  . DM2 (diabetes mellitus, type 2) ([place]) 07/11/2015  . Hypertension 07/11/2015  . Hyperlipidemia 07/11/2015    Orientation RESPIRATION BLADDER Height & Weight     Self,  Situation, Place  Normal Continent Weight: 161 lb 1 oz (73.1 kg) Height:  5\' 2"  (157.5 cm)  BEHAVIORAL SYMPTOMS/MOOD NEUROLOGICAL BOWEL NUTRITION STATUS      Continent (Carb Modified)  AMBULATORY STATUS COMMUNICATION OF NEEDS Skin   Extensive Assist Verbally Surgical wounds-Right Knee                       Personal Care Assistance  Level of Assistance  Bathing, Feeding, Dressing Bathing Assistance: Limited assistance Feeding assistance: Independent Dressing Assistance: Maximum assistance     Functional Limitations Info  Sight, Hearing, Speech Sight Info: Adequate Hearing Info: Adequate Speech Info: Adequate    SPECIAL CARE FACTORS FREQUENCY  PT (By licensed PT), OT (By licensed OT)     PT Frequency: 5x/week OT Frequency: 5x/week            Contractures Contractures Info: Not present    Additional Factors Info  Code Status, Allergies, Psychotropic Code Status Info: Fullcode Allergies Info: Asa Aspirin, Other Psychotropic Info: Xanax, Celexa         Current Medications (11/23/2018):  This is the current hospital active medication list Current Facility-Administered Medications  Medication Dose Route Frequency Provider Last Rate Last Dose  . 0.9 %  sodium chloride infusion   Intravenous Continuous Danae Orleans, PA-C 75 mL/hr at 11/23/18 0138    . acetaminophen (TYLENOL) tablet 325-650 mg  325-650 mg Oral Q6H PRN Danae Orleans, PA-C      . ALPRAZolam Duanne Moron) tablet 0.25 mg  0.25 mg Oral BID Danae Orleans, PA-C   0.25 mg at 11/23/18 [id number]  . alum & mag hydroxide-simeth (MAALOX/MYLANTA) 200-200-20 MG/5ML suspension 15 mL  15 mL Oral Q4H PRN Babish, Matthew, PA-C      . atenolol (TENORMIN) tablet 50 mg  50 mg Oral Daily Danae Orleans, PA-C   50 mg at 11/23/18 0907  . atorvastatin (LIPITOR) tablet  80 mg  80 mg Oral Q2000 Danae Orleans, PA-C   80 mg at 11/22/18 2055  . bisacodyl (DULCOLAX) suppository 10 mg  10 mg Rectal Daily PRN Babish, Matthew, PA-C      . buPROPion (WELLBUTRIN XL) 24 hr tablet 150 mg  150 mg Oral Daily Danae Orleans, PA-C   150 mg at 11/23/18 [id number]  . citalopram (CELEXA) tablet 40 mg  40 mg Oral Daily Danae Orleans, PA-C   40 mg at 11/23/18 0908  . diltiazem (CARDIZEM CD) 24 hr capsule 180 mg  180 mg Oral Daily Danae Orleans, PA-C   180 mg at 11/23/18 0908  .  diphenhydrAMINE (BENADRYL) 12.5 MG/5ML elixir 12.5-25 mg  12.5-25 mg Oral Q4H PRN Danae Orleans, PA-C      . docusate sodium (COLACE) capsule 100 mg  100 mg Oral BID Danae Orleans, PA-C   100 mg at 11/23/18 [id number]  . ferrous sulfate tablet 325 mg  325 mg Oral BID WC Danae Orleans, PA-C   325 mg at 11/23/18 0855  . fluticasone (FLONASE) 50 MCG/ACT nasal spray 1 spray  1 spray Each Nare Daily Danae Orleans, PA-C   1 spray at 11/23/18 [id number]  . furosemide (LASIX) tablet 40 mg  40 mg Oral Daily Babish, Matthew, PA-C      . glipiZIDE (GLUCOTROL) tablet 5 mg  5 mg Oral BID AC Babish, Rodman [place], PA-C   5 mg at 11/23/18 0855  . HYDROcodone-acetaminophen (NORCO) 7.5-325 MG per tablet 1-2 tablet  1-2 tablet Oral Q4H PRN Danae Orleans, PA-C      . HYDROcodone-acetaminophen (NORCO/VICODIN) 5-325 MG per tablet 1-2 tablet  1-2 tablet Oral Q4H PRN Danae Orleans, PA-C   2 tablet at 11/23/18 [id number]  . HYDROmorphone (DILAUDID) injection 0.5-1 mg  0.5-1 mg Intravenous Q2H PRN Danae Orleans, PA-C      . insulin aspart (novoLOG) injection 0-15 Units  0-15 Units Subcutaneous TID WC Danae Orleans, PA-C   8 Units at 11/23/18 0856  . insulin glargine (LANTUS) injection 65 Units  65 Units Subcutaneous Daily Danae Orleans, PA-C   65 Units at 11/23/18 [id number]  . linagliptin (TRADJENTA) tablet 5 mg  5 mg Oral Q breakfast Paralee Cancel, MD   5 mg at 11/23/18 0855  . loratadine (CLARITIN) tablet 10 mg  10 mg Oral Daily Danae Orleans, PA-C   10 mg at 11/23/18 0908  . magnesium citrate solution 1 Bottle  1 Bottle Oral Once PRN Babish, Matthew, PA-C      . menthol-cetylpyridinium (CEPACOL) lozenge 3 mg  1 lozenge Oral PRN Danae Orleans, PA-C       Or  . phenol (CHLORASEPTIC) mouth spray 1 spray  1 spray Mouth/Throat PRN Danae Orleans, PA-C      . metFORMIN (GLUCOPHAGE) tablet 500 mg  500 mg Oral BID WC Paralee Cancel, MD   500 mg at 11/23/18 0855  . methocarbamol (ROBAXIN) tablet 500 mg  500 mg Oral Q6H PRN Danae Orleans, PA-C       Or  . methocarbamol (ROBAXIN) 500 mg in dextrose 5 % 50 mL IVPB  500 mg Intravenous Q6H PRN Danae Orleans, PA-C 100 mL/hr at 11/22/18 1816 500 mg at 11/22/18 1816  . metoCLOPramide (REGLAN) tablet 5-10 mg  5-10 mg Oral Q8H PRN Danae Orleans, PA-C       Or  . metoCLOPramide (REGLAN) injection 5-10 mg  5-10 mg Intravenous Q8H PRN Babish, Matthew, PA-C      . montelukast (SINGULAIR) tablet 10 mg  10  mg Oral QHS Danae Orleans, PA-C   10 mg at 11/22/18 2250  . ondansetron (ZOFRAN) tablet 4 mg  4 mg Oral Q6H PRN Danae Orleans, PA-C       Or  . ondansetron [facility]) injection 4 mg  4 mg Intravenous Q6H PRN Babish, Matthew, PA-C      . polyethylene glycol (MIRALAX / GLYCOLAX) packet 17 g  17 g Oral BID Danae Orleans, PA-C   17 g at 11/23/18 [id number]  . potassium chloride (K-DUR) CR tablet 10 mEq  10 mEq Oral Daily Danae Orleans, PA-C   10 mEq at 11/23/18 [id number]  . rifaximin (XIFAXAN) tablet 550 mg  550 mg Oral BID Danae Orleans, PA-C   550 mg at 11/23/18 0908  . spironolactone (ALDACTONE) tablet 50 mg  50 mg Oral Daily Danae Orleans, PA-C   50 mg at 11/23/18 [id number]     Discharge Medications: Please see discharge summary for a list of discharge medications.  Relevant Imaging Results:  Relevant Lab Results:   Additional Information SSN: [id number]  Lia Hopping, LCSW

## 2018-11-24 LAB — BASIC METABOLIC PANEL
Anion gap: 11 (ref 5–15)
BUN: 30 mg/dL — ABNORMAL HIGH (ref 8–23)
CO2: 18 mmol/L — ABNORMAL LOW (ref 22–32)
Calcium: 8 mg/dL — ABNORMAL LOW (ref 8.9–10.3)
Chloride: 101 mmol/L (ref 98–111)
Creatinine, Ser: 1.24 mg/dL — ABNORMAL HIGH (ref 0.44–1.00)
GFR calc Af Amer: 51 mL/min — ABNORMAL LOW (ref >60)
GFR calc non Af Amer: 44 mL/min — ABNORMAL LOW (ref >60)
Glucose, Bld: 259 mg/dL — ABNORMAL HIGH (ref 70–99)
Potassium: 4.6 mmol/L (ref 3.5–5.1)
Sodium: 130 mmol/L — ABNORMAL LOW (ref 135–145)

## 2018-11-24 LAB — GLUCOSE, CAPILLARY
Glucose-Capillary: 269 mg/dL — ABNORMAL HIGH (ref 70–99)
Glucose-Capillary: 253 mg/dL — ABNORMAL HIGH (ref 70–99)
Glucose-Capillary: 173 mg/dL — ABNORMAL HIGH (ref 70–99)
Glucose-Capillary: 157 mg/dL — ABNORMAL HIGH (ref 70–99)

## 2018-11-24 LAB — CBC
HCT: 31.5 % — ABNORMAL LOW (ref 36.0–46.0)
Hemoglobin: 9.8 g/dL — ABNORMAL LOW (ref 12.0–15.0)
MCH: 30.5 pg (ref 26.0–34.0)
MCHC: 31.1 g/dL (ref 30.0–36.0)
MCV: 98.1 fL (ref 80.0–100.0)
Platelets: 55 10*3/uL — ABNORMAL LOW (ref 150–400)
RBC: 3.21 MIL/uL — ABNORMAL LOW (ref 3.87–5.11)
RDW: 15.8 % — ABNORMAL HIGH (ref 11.5–15.5)
WBC: 7.1 10*3/uL (ref 4.0–10.5)
nRBC: 0 % (ref 0.0–0.2)

## 2018-11-24 NOTE — Op Note (Signed)
NAMESAHAR, RYBACK MEDICAL RECORD [id number] ACCOUNT [id number] DATE OF BIRTH:03-05-1946 FACILITY: [facility] LOCATION: [facility] PHYSICIAN:Jaikob Borgwardt D. Naasir Carreira, MD  OPERATIVE REPORT  DATE OF PROCEDURE:  11/22/2018  PREOPERATIVE DIAGNOSIS:  Probable extensor mechanism disruption in the infrapatellar region.  POSTOPERATIVE DIAGNOSIS:  Complex disruption to the inferior extensor mechanism below the patella.  PROCEDURE:  Attempted repair of the extensor mechanism and retinaculum in the right knee.  SURGEON:  Paralee Cancel, MD  ASSISTANT:  Danae Orleans, PA-C  ANESTHESIA:  Regional plus general.  TOURNIQUET:  Up for 40 minutes at 250 mmHg.  DRAINS:  None.  COMPLICATIONS:  None.  INDICATIONS:  The patient is a 73 year old female with history of an index right total knee replacement that was complicated by early postoperative fall and significantly comminuted displaced distal femur fracture.  This required revision to a distal  femoral replacing total knee arthroplasty.  In early postoperative period, she was seen back in the office and noted to have concerns for patella alta as well as palpable implant through her skin.  I was worried about extensor mechanism disruption but  also the proximity of the components to skin and eroding weight through the skin, results of infection.  It is my recommendation for her to undergo exploratory surgery of her extensor mechanism and attempted repair.  Risks of infection, failure to find  repairable solution, failure of any repair that is performed, and the potential need for amputation versus knee fusion were all reviewed extensively with the patient and her family.  Consent was obtained for attempted repair from above.  PROCEDURE IN DETAIL:  The patient was brought to the operative theater once adequate anesthesia, preoperative antibiotics, and Ancef administered.  She was positioned supine with a thigh tourniquet placed.  The lower extremity was then  prepped and draped  in sterile fashion.  A timeout was performed identifying the patient, the planned procedure, and extremity.  Her old incision was excised.  Soft tissue planes were attempted to be created.  This was only possible really at the patella, which was  definitely high riding in the quadriceps region.  What I identified was significantly abnormal, macerated, and unhealthy-appearing retinacular tissues in the anterior patellar region.  I found about a 2 cm area of what looked to be like the patella  tendon off the inferior aspect of the patella.  Otherwise, there was no substance of material.  Given these findings, I tried to expose and identify as much of the retinacular tissue as possible.  I then irrigated the wound with normal saline solution.   At this point, I felt that the only thing that I could do was to reapproximate the retinacular tissue, hold her in extension as long as possible to try to get some healing.  Discussed the plans with the family we would need to follow in terms of  definitive treatment.  I reapproximated the extensor mechanism material using a combination of  #1 Vicryl and #1 Stratafix suture as best as possible to be done.  This in no way is restoring the patella into its normal anatomic position as it was  impossible to number 1 mobilize it, but also that there was no significant tissue distally to reapproximate the tendon.  The remainder of the wound was closed with 2-0 Vicryl, then a Monocryl stitch.  The knee was cleaned, dried, and dressed sterilely  using surgical glue and an Aquacel dressing.  She was then brought to the recovery room in stable condition, tolerating the  procedure well.  I reviewed these initial findings with her daughter.  Postoperatively, she will be placed into a TROM brace  locked in extension at all times.  No active or passive flexion will be permitted.  She will be weightbearing as tolerated and work on strengthening and functional  movements to see what she is capable of doing ____.  LN/NUANCE  D:11/23/2018 T:11/24/2018 JOB:[id number]

## 2018-11-24 NOTE — Progress Notes (Signed)
     Subjective: 2 Days Post-Op Procedure(s) (LRB): repair right knee extensor mechanism (Right)   Patient reports pain as moderate, pain controlled. Stressed the importance of not bending the knee and to keep the brace on and locked in extension.  Dr. Alvan Dame has already stressed the importance of keeping the leg straight and warned her that if the fixation fails that she may be looking at needing to have the leg amputated. Currently she is total assist and needing +2 assist and not even able to transfer.  Currently she is not safe to be discharged from the hospital.  She will need to work with PT and OT to maximize whaty she can to safely d/c to a facility, resource are not adequate for care at home.  Discharge date TBD dependent on stability of underlying medical co-morbidities, pain control and need for inpatient PT / OT to meet goal of being discharged safely.  Past Medical History:  Diagnosis Date  . Acute and subacute hepatic failure without coma   . Allergy   . Anasarca   . Anxiety   . Arthritis   . Ascites 09/2018   no SBP on 850 cc tap 10/13/18  . Cirrhosis of liver ([place]) ~ 2004   from NAFLD.  Dx ~ 2004.  Dr Dorcas Mcmurray at [facility]/    . Depression   . Diabetes mellitus without complication ([place])    type 2  . Eczema of both hands   . Endometrial cancer ([place]) 1996, 2003   hysterectomy 1996.  recurrence 2003, treated with radiation.    . Generalized edema   . GI hemorrhage   . Headache   . 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 in [place]  . Hyperlipidemia   . Hypertension   . Muscle weakness   . Seizures ([place])    last seizure June 07, 2014   . Thrombocytopenia ([place])   . Thyroid nodule          Objective:   VITALS:   Vitals:   11/23/18 2225 11/24/18 0549  BP: (!) 106/54 (!) 108/56  Pulse: (!) 59 64  Resp: 16 14  Temp: 97.9 F (36.6 C) (!) 97.5 F (36.4 C)  SpO2: 99% 100%   Bledsoe brace locked in  extrension Incision: dressing C/D/I No cellulitis present Compartment soft  LABS Recent Labs    11/22/18 1437 11/23/18 0227 11/24/18 0232  HGB 12.2 10.8* 9.8*  HCT 35.8* 33.4* 31.5*  WBC 5.5 4.1 7.1  PLT 97* 59* 55*    Recent Labs    11/22/18 1437 11/23/18 0227 11/24/18 0232  NA 135 134* 130*  K 3.4* 4.1 4.6  BUN 19 21 30*  CREATININE 1.24* 1.13* 1.24*  GLUCOSE 182* 269* 259*     Assessment/Plan: 2 Days Post-Op Procedure(s) (LRB): repair right knee extensor mechanism (Right)  Continue working with PT / OT to improve to the point of safely d/c to a SNF Up with therapy Discharge to SNF eventually when ready Call daughter [name] with any updates / concerns / Donnamae Jude. Alphonsus Doyel   PAC  11/24/2018, 8:45 AM

## 2018-11-24 NOTE — Plan of Care (Signed)
Plan of care reviewed and discussed with the patient. 

## 2018-11-25 LAB — GLUCOSE, CAPILLARY
Glucose-Capillary: 197 mg/dL — ABNORMAL HIGH (ref 70–99)
Glucose-Capillary: 217 mg/dL — ABNORMAL HIGH (ref 70–99)
Glucose-Capillary: 130 mg/dL — ABNORMAL HIGH (ref 70–99)
Glucose-Capillary: 120 mg/dL — ABNORMAL HIGH (ref 70–99)

## 2018-11-25 NOTE — Progress Notes (Signed)
     Subjective: 3 Days Post-Op Procedure(s) (LRB): repair right knee extensor mechanism (Right)   Patient reports pain as mild/moderate.   No reported events throughout the night.  Feels that she is really progressing slowly with therapy.  Difficulty ambulating with overall deconditioning.      Objective:   VITALS:   Vitals:   11/24/18 2133 11/25/18 0554  BP: 113/60 113/66  Pulse: 68 64  Resp: 20 14  Temp: 98.1 F (36.7 C) 98.2 F (36.8 C)  SpO2: 100% 100%    Incision: dressing C/D/I No cellulitis present Compartment soft  LABS Recent Labs    11/22/18 1437 11/23/18 0227 11/24/18 0232  HGB 12.2 10.8* 9.8*  HCT 35.8* 33.4* 31.5*  WBC 5.5 4.1 7.1  PLT 97* 59* 55*    Recent Labs    11/22/18 1437 11/23/18 0227 11/24/18 0232  NA 135 134* 130*  K 3.4* 4.1 4.6  BUN 19 21 30*  CREATININE 1.24* 1.13* 1.24*  GLUCOSE 182* 269* 259*     Assessment/Plan: 3 Days Post-Op Procedure(s) (LRB): repair right knee extensor mechanism (Right)   Up with therapy Discharge to SNF mostly likely, when safe   [place]. Coren Sagan   PAC  11/25/2018, 8:48 AM

## 2018-11-25 NOTE — Progress Notes (Signed)
Physical Therapy Treatment Patient Details Name: Brittany Russo MRN: [id number] DOB: 05-16-1945 Today's Date: 11/25/2018    History of Present Illness Pt is a 73 y.o. female with admissions 10/10/18 for lower GI bleed and 09/22/18 for R TKA with subsequent fall resulting in readmission with R distal femur fx s/p TKA repair; also with PMH includes HTN, DM2, obesity.  Pt admitted for repair right knee extensor mechanism on 11/22/18.    PT Comments    Patient received in bed on bedpan willing to participate in PT today. Able to roll for removal of bedpan with MaxA, pericare performed with total A today, bloody pebble like bowel movements noted. Ensured that Bledsoe brace was locked in extension prior to mobility, then required MaxAx2 for functional bed mobility this session. Performed multiple stands at EOB initially with MaxAx2, however this faded to ModAx2 by end of session with improved sequencing. Noted strong posterior lean in standing and spent quite a bit of time working on balance/pregait at bedside with max VC and mod TC provided for anterior weight shift to midline upright today. Did not introduce gait training this session due to poor static balance standing at bedside and concern that patient would not be able to maintain 50% PWB status in face of balance deficits. Patient able to achieve this briefly at end of session without posterior lean, however very fatigued at this point. She was returned to supine with MaxAx2 and positioned to comfort, bed alarm active and all other needs met this afternoon.   Follow Up Recommendations  SNF;Supervision/Assistance - 24 hour     Equipment Recommendations  None recommended by PT    Recommendations for Other Services       Precautions / Restrictions Precautions Precautions: Fall;Knee Precaution Comments: No ROM to R knee Required Braces or Orthoses: Other Brace Knee Immobilizer - Right: On at all times Other Brace: bledsoe brace locked in  extension Restrictions Weight Bearing Restrictions: Yes RLE Weight Bearing: Partial weight bearing RLE Partial Weight Bearing Percentage or Pounds: 50% Other Position/Activity Restrictions: PWB RLE    Mobility  Bed Mobility Overal bed mobility: Needs Assistance   Rolling: Max assist   Supine to sit: Mod assist;+2 for physical assistance Sit to supine: Max assist;+2 for physical assistance   General bed mobility comments: able to roll with assist of one, but continues to require Mod-MaxA of +2 for all other bed mobility  Transfers Overall transfer level: Needs assistance Equipment used: Rolling walker (2 wheeled) Transfers: Sit to/from Stand Sit to Stand: Max assist;Mod assist;+2 physical assistance         General transfer comment: performed multiple stands from bed initially with MaxAx2, fading to ModAx2; strong posterior lean/LOB requiring heavy ModAx2 to maintain upright  Ambulation/Gait             General Gait Details: unable   Stairs             Wheelchair Mobility    Modified Rankin (Stroke Patients Only)       Balance Overall balance assessment: Needs assistance Sitting-balance support: Feet supported;Bilateral upper extremity supported Sitting balance-Leahy Scale: Fair   Postural control: Posterior lean Standing balance support: Bilateral upper extremity supported Standing balance-Leahy Scale: Poor Standing balance comment: heavily reliant on UEs throughout session for stability, strong posterior lean                            Cognition Arousal/Alertness: Awake/alert Behavior During Therapy: Anxious  Overall Cognitive Status: Within Functional Limits for tasks assessed Area of Impairment: Memory;Following commands;Problem solving                     Memory: Decreased short-term memory;Decreased recall of precautions Following Commands: Follows one step commands with increased time;Follows multi-step commands  inconsistently     Problem Solving: Requires verbal cues;Difficulty sequencing General Comments: pt very anxious about knee, reassured brace locked in extension and therapist supported during bed mobility      Exercises      General Comments General comments (skin integrity, edema, etc.): had small, pebble like bloody bowel movement, totalA for pericare in bed      Pertinent Vitals/Pain Pain Assessment: Faces Faces Pain Scale: Hurts little more Pain Location: R LE and back Pain Descriptors / Indicators: Aching;Sore Pain Intervention(s): Monitored during session;Limited activity within patient's tolerance;Repositioned    Home Living                      Prior Function            PT Goals (current goals can now be found in the care plan section) Acute Rehab PT Goals Patient Stated Goal: be able to walk PT Goal Formulation: With patient Time For Goal Achievement: 12/07/18 Potential to Achieve Goals: Good Progress towards PT goals: Progressing toward goals    Frequency    Min 3X/week      PT Plan Current plan remains appropriate    Co-evaluation              AM-PAC PT "6 Clicks" Mobility   Outcome Measure  Help needed turning from your back to your side while in a flat bed without using bedrails?: A Lot Help needed moving from lying on your back to sitting on the side of a flat bed without using bedrails?: A Lot Help needed moving to and from a bed to a chair (including a wheelchair)?: Total Help needed standing up from a chair using your arms (e.g., wheelchair or bedside chair)?: Total Help needed to walk in hospital room?: Total Help needed climbing 3-5 steps with a railing? : Total 6 Click Score: 8    End of Session Equipment Utilized During Treatment: Gait belt;Other (comment)(Bledsoe brace locked in extension) Activity Tolerance: Patient limited by fatigue Patient left: with call bell/phone within reach;in bed;with bed alarm set   PT  Visit Diagnosis: Muscle weakness (generalized) (M62.81);Other abnormalities of gait and mobility (R26.89) Pain - Right/Left: Right Pain - part of body: Knee     Time: 7342-8768 PT Time Calculation (min) (ACUTE ONLY): 30 min  Charges:  $Therapeutic Activity: 23-37 mins                     Deniece Ree PT, DPT, CBIS  Supplemental Physical Therapist [place]    Pager [phone] Acute Rehab Office [phone]

## 2018-11-25 NOTE — Care Management Important Message (Signed)
Important Message  Patient Details IM Letter given to Velva Harman RN to present to the Patient Name: Brittany Russo MRN: [id number] Date of Birth: [birth year]   Medicare Important Message Given:  Yes     Kerin Salen 11/25/2018, 11:13 AM

## 2018-11-26 LAB — TYPE AND SCREEN
ABO/RH(D): A POS
Antibody Screen: NEGATIVE
Unit division: 0
Unit division: 0

## 2018-11-26 LAB — BPAM RBC
Blood Product Expiration Date: 202009222359
Blood Product Expiration Date: 202009252359
Unit Type and Rh: 6200
Unit Type and Rh: 6200

## 2018-11-26 LAB — GLUCOSE, CAPILLARY
Glucose-Capillary: 43 mg/dL — CL (ref 70–99)
Glucose-Capillary: 54 mg/dL — ABNORMAL LOW (ref 70–99)
Glucose-Capillary: 72 mg/dL (ref 70–99)
Glucose-Capillary: 106 mg/dL — ABNORMAL HIGH (ref 70–99)
Glucose-Capillary: 128 mg/dL — ABNORMAL HIGH (ref 70–99)
Glucose-Capillary: 150 mg/dL — ABNORMAL HIGH (ref 70–99)

## 2018-11-26 LAB — CBC WITH DIFFERENTIAL/PLATELET
Abs Immature Granulocytes: 0.03 10*3/uL (ref 0.00–0.07)
Basophils Absolute: 0 10*3/uL (ref 0.0–0.1)
Basophils Relative: 0 %
Eosinophils Absolute: 0.1 10*3/uL (ref 0.0–0.5)
Eosinophils Relative: 1 %
HCT: 31.2 % — ABNORMAL LOW (ref 36.0–46.0)
Hemoglobin: 9.9 g/dL — ABNORMAL LOW (ref 12.0–15.0)
Immature Granulocytes: 1 %
Lymphocytes Relative: 14 %
Lymphs Abs: 0.8 10*3/uL (ref 0.7–4.0)
MCH: 30 pg (ref 26.0–34.0)
MCHC: 31.7 g/dL (ref 30.0–36.0)
MCV: 94.5 fL (ref 80.0–100.0)
Monocytes Absolute: 0.5 10*3/uL (ref 0.1–1.0)
Monocytes Relative: 9 %
Neutro Abs: 4.1 10*3/uL (ref 1.7–7.7)
Neutrophils Relative %: 75 %
Platelets: 64 10*3/uL — ABNORMAL LOW (ref 150–400)
RBC: 3.3 MIL/uL — ABNORMAL LOW (ref 3.87–5.11)
RDW: 16.1 % — ABNORMAL HIGH (ref 11.5–15.5)
WBC: 5.5 10*3/uL (ref 4.0–10.5)
nRBC: 0 % (ref 0.0–0.2)

## 2018-11-26 NOTE — Plan of Care (Signed)
  Problem: Clinical Measurements: °Goal: Ability to maintain clinical measurements within normal limits will improve °Outcome: Progressing °  °Problem: Nutrition: °Goal: Adequate nutrition will be maintained °Outcome: Progressing °  °Problem: Coping: °Goal: Level of anxiety will decrease °Outcome: Progressing °  °Problem: Pain Managment: °Goal: General experience of comfort will improve °Outcome: Progressing °  °

## 2018-11-26 NOTE — Progress Notes (Signed)
Subjective: 4 Days Post-Op Procedure(s) (LRB): repair right knee extensor mechanism (Right) Patient reports pain as mild.   Patient seen in rounds for Dr. Alvan Dame. Patient is well, and has had no acute complaints or problems other than pain in the right knee. Patient is resting in bed on exam. She states she slept some last night. She has been eating okay, and denies nausea/vomiting. Positive bowel movement. She is voiding without difficulty, although has gross blood in her urine. This is a chronic problem, and she has an appointment with a urologist on 9/25.  Plan is to go Nursing Home after hospital stay.  Objective: Vital signs in last 24 hours: Temp:  [97.7 F (36.5 C)-98.2 F (36.8 C)] 98.2 F (36.8 C) (09/05 1341) Pulse Rate:  [66-69] 69 (09/05 1341) Resp:  [16] 16 (09/05 1341) BP: (125-145)/(58-69) 145/69 (09/05 1341) SpO2:  [98 %-100 %] 100 % (09/05 1341)  Intake/Output from previous day:  Intake/Output Summary (Last 24 hours) at 11/26/2018 1505 Last data filed at 11/26/2018 1409 Gross per 24 hour  Intake 2229.4 ml  Output 1700 ml  Net 529.4 ml    Intake/Output this shift: Total I/O In: 1989.4 [P.O.:480; I.V.:1509.4] Out: 500 [Urine:500]  Labs: Recent Labs    11/24/18 0232  HGB 9.8*   Recent Labs    11/24/18 0232  WBC 7.1  RBC 3.21*  HCT 31.5*  PLT 55*   Recent Labs    11/24/18 0232  NA 130*  K 4.6  CL 101  CO2 18*  BUN 30*  CREATININE 1.24*  GLUCOSE 259*  CALCIUM 8.0*   No results for input(s): LABPT, INR in the last 72 hours.  Exam: General - Patient is Alert and Oriented Extremity - Neurologically intact Sensation intact distally Intact pulses distally Dorsiflexion/Plantar flexion intact Dressing/Incision - clean, dry Motor Function - intact, moving foot and toes well on exam.   Past Medical History:  Diagnosis Date  . Acute and subacute hepatic failure without coma   . Allergy   . Anasarca   . Anxiety   . Arthritis   . Ascites  09/2018   no SBP on 850 cc tap 10/13/18  . Cirrhosis of liver ([place]) ~ 2004   from NAFLD.  Dx ~ 2004.  Dr Dorcas Mcmurray at [facility]/    . Depression   . Diabetes mellitus without complication ([place])    type 2  . Eczema of both hands   . Endometrial cancer ([place]) 1996, 2003   hysterectomy 1996.  recurrence 2003, treated with radiation.    . Generalized edema   . GI hemorrhage   . Headache   . 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 in [place]  . Hyperlipidemia   . Hypertension   . Muscle weakness   . Seizures ([place])    last seizure June 07, 2014   . Thrombocytopenia ([place])   . Thyroid nodule     Assessment/Plan: 4 Days Post-Op Procedure(s) (LRB): repair right knee extensor mechanism (Right) Active Problems:   Mechanical problem with extremity   Hematuria   Overweight (BMI 25.0-29.9)  Estimated body mass index is 29.46 kg/m as calculated from the following:   Height as of this encounter: 5' 2"  (1.575 m).   Weight as of this encounter: 73.1 kg. Advance diet Up with therapy  Partial weightbearing to the RLE No ROM of the right knee  DVT Prophylaxis - Foot Pumps and TED hose, no chemical DVT  prophylaxis per Dr. Alvan Dame due to low platelets and risk for bleeding  Patient is doing fair this morning. She will continue working with therapy on gait training and PWB status. Continue pain management.   Griffith Citron, PA-C Orthopedic Surgery 11/26/2018, 3:05 PM

## 2018-11-27 LAB — GLUCOSE, CAPILLARY
Glucose-Capillary: 63 mg/dL — ABNORMAL LOW (ref 70–99)
Glucose-Capillary: 66 mg/dL — ABNORMAL LOW (ref 70–99)
Glucose-Capillary: 273 mg/dL — ABNORMAL HIGH (ref 70–99)
Glucose-Capillary: 161 mg/dL — ABNORMAL HIGH (ref 70–99)
Glucose-Capillary: 130 mg/dL — ABNORMAL HIGH (ref 70–99)

## 2018-11-27 NOTE — Progress Notes (Signed)
Pt refused lasix, aldactone, and stool softener products. She told rn she would take when she goes to snf tomorrow.

## 2018-11-27 NOTE — Plan of Care (Signed)
  Problem: Clinical Measurements: Goal: Respiratory complications will improve Outcome: Progressing   Problem: Clinical Measurements: Goal: Cardiovascular complication will be avoided Outcome: Progressing   Problem: Nutrition: Goal: Adequate nutrition will be maintained Outcome: Progressing   

## 2018-11-27 NOTE — Progress Notes (Signed)
Called to the room by NT.  Stool was oozing out of the vaginal canal, but not the rectum.  Will be sure to report to day shift and will notify doctor in the am.

## 2018-11-27 NOTE — Progress Notes (Signed)
Subjective: 5 Days Post-Op Procedure(s) (LRB): repair right knee extensor mechanism (Right) Patient reports pain as mild.   Patient seen in rounds for Dr. Alvan Dame. Patient is well, and has had no acute complaints or problems other than pain in the right knee. Patient is resting in bed on exam.  Positive bowel movement. She is voiding without difficulty, although has gross blood in her urine. This is a chronic problem, and she has an appointment with a urologist on 9/25.    Plan is to go Nursing Home after hospital stay.  Objective: Vital signs in last 24 hours: Temp:  [98.2 F (36.8 C)-98.5 F (36.9 C)] 98.5 F (36.9 C) (09/06 0622) Pulse Rate:  [63-69] 64 (09/06 0622) Resp:  [16] 16 (09/06 0622) BP: (128-145)/(58-69) 128/58 (09/06 0622) SpO2:  [95 %-100 %] 95 % (09/06 0622)  Intake/Output from previous day:  Intake/Output Summary (Last 24 hours) at 11/27/2018 1004 Last data filed at 11/27/2018 0855 Gross per 24 hour  Intake 2589.4 ml  Output 1400 ml  Net 1189.4 ml    Intake/Output this shift: Total I/O In: 360 [P.O.:360] Out: 200 [Urine:200]  Labs: Recent Labs    11/26/18 1550  HGB 9.9*   Recent Labs    11/26/18 1550  WBC 5.5  RBC 3.30*  HCT 31.2*  PLT 64*   No results for input(s): NA, K, CL, CO2, BUN, CREATININE, GLUCOSE, CALCIUM in the last 72 hours. No results for input(s): LABPT, INR in the last 72 hours.  Exam: General - Patient is Alert and Oriented Extremity - Neurologically intact Sensation intact distally Intact pulses distally Dorsiflexion/Plantar flexion intact Dressing/Incision - clean, dry Motor Function - intact, moving foot and toes well on exam.  She does have a flexible plantar flexion contracture of the right heel with some beats of clonus consistent with increased muscle tone.  I am not quite able to get her to neutral.  Past Medical History:  Diagnosis Date  . Acute and subacute hepatic failure without coma   . Allergy   . Anasarca    . Anxiety   . Arthritis   . Ascites 09/2018   no SBP on 850 cc tap 10/13/18  . Cirrhosis of liver ([place]) ~ 2004   from NAFLD.  Dx ~ 2004.  Dr Dorcas Mcmurray at [facility]/    . Depression   . Diabetes mellitus without complication ([place])    type 2  . Eczema of both hands   . Endometrial cancer ([place]) 1996, 2003   hysterectomy 1996.  recurrence 2003, treated with radiation.    . Generalized edema   . GI hemorrhage   . Headache   . 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 in [place]  . Hyperlipidemia   . Hypertension   . Muscle weakness   . Seizures ([place])    last seizure June 07, 2014   . Thrombocytopenia ([place])   . Thyroid nodule     Assessment/Plan: 5 Days Post-Op Procedure(s) (LRB): repair right knee extensor mechanism (Right) Active Problems:   Mechanical problem with extremity   Hematuria   Overweight (BMI 25.0-29.9)  Estimated body mass index is 29.46 kg/m as calculated from the following:   Height as of this encounter: 5' 2"  (1.575 m).   Weight as of this encounter: 73.1 kg. Advance diet Up with therapy  Partial weightbearing to the RLE No ROM of the right knee  DVT Prophylaxis - Foot Pumps and TED  hose, no chemical DVT prophylaxis per Dr. Alvan Dame due to low platelets and risk for bleeding  Patient is doing fair this morning. She will continue working with therapy on gait training and PWB status. Continue pain management.   -We will consider placing a boot on the right ankle to keep her at neutral dorsiflexion.  This will be somewhat difficult with the brace on the knee.  However, I worry about her developing a fixed contracture.  I will discuss that with Dr. Alvan Dame.  Nicholes Stairs  Orthopedic Surgery 11/27/2018, 10:04 AM

## 2018-11-28 ENCOUNTER — Inpatient Hospital Stay (HOSPITAL_COMMUNITY): Payer: Medicare Other

## 2018-11-28 LAB — CBC
HCT: 29.9 % — ABNORMAL LOW (ref 36.0–46.0)
Hemoglobin: 9.7 g/dL — ABNORMAL LOW (ref 12.0–15.0)
MCH: 31.4 pg (ref 26.0–34.0)
MCHC: 32.4 g/dL (ref 30.0–36.0)
MCV: 96.8 fL (ref 80.0–100.0)
Platelets: 57 10*3/uL — ABNORMAL LOW (ref 150–400)
RBC: 3.09 MIL/uL — ABNORMAL LOW (ref 3.87–5.11)
RDW: 16.4 % — ABNORMAL HIGH (ref 11.5–15.5)
WBC: 3.8 10*3/uL — ABNORMAL LOW (ref 4.0–10.5)
nRBC: 0 % (ref 0.0–0.2)

## 2018-11-28 LAB — GLUCOSE, CAPILLARY
Glucose-Capillary: 169 mg/dL — ABNORMAL HIGH (ref 70–99)
Glucose-Capillary: 217 mg/dL — ABNORMAL HIGH (ref 70–99)
Glucose-Capillary: 143 mg/dL — ABNORMAL HIGH (ref 70–99)

## 2018-11-28 MED ORDER — IOHEXOL 300 MG/ML  SOLN
30.0000 mL | Freq: Once | INTRAMUSCULAR | Status: AC
  Administered 2018-11-28: 30 mL via ORAL

## 2018-11-28 MED ORDER — SODIUM CHLORIDE (PF) 0.9 % IJ SOLN
INTRAMUSCULAR | Status: AC
  Filled 2018-11-28: qty 50

## 2018-11-28 MED ORDER — IOHEXOL 300 MG/ML  SOLN
100.0000 mL | Freq: Once | INTRAMUSCULAR | Status: AC | PRN
  Administered 2018-11-28: 100 mL via INTRAVENOUS

## 2018-11-28 NOTE — Progress Notes (Signed)
Patient ID: Brittany Russo, female   DOB: [birth year], 73 y.o.   MRN: [id number] Subjective: 6 Days Post-Op Procedure(s) (LRB): repair right knee extensor mechanism (Right)    Patient reports pain as mild.  Not much knee pain.  Not much activity  Reviewed notes from [facility] and discussed  Reviewed nursing notes and concerns regarding stool from her vaginal vault - history of endometrial cancer 2006? With history of XRT to this region - see chronic cystitis concerns  Objective:   VITALS:   Vitals:   11/27/18 2147 11/28/18 0535  BP: (!) 124/59 (!) 135/55  Pulse: 64 65  Resp:  14  Temp: 98.2 F (36.8 C) 98.1 F (36.7 C)  SpO2: 100% 99%    Neurovascular intact Incision: dressing C/D/I   Very tight passive DF bilateral, motor intact but weak  LABS Recent Labs    11/26/18 1550  HGB 9.9*  HCT 31.2*  WBC 5.5  PLT 64*    No results for input(s): NA, K, BUN, CREATININE, GLUCOSE in the last 72 hours.  No results for input(s): LABPT, INR in the last 72 hours.   Assessment/Plan: 6 Days Post-Op Procedure(s) (LRB): repair right knee extensor mechanism (Right)  Plan:  Continue efforts of OOB activity, transfers with knee brace locked in extension  Touched base re options to manage her RLE long term.  Will plan to have further discussions with family. For now I want her body to heal from all it has been through in addition to recently raised concerns  I have ordered a KUB to at least rule out free air in the abdomen CBC re-ordered  I will consult General Surgery to see further steps in evaluating for any fistula development  D/C plan pending abdominal work up

## 2018-11-28 NOTE — Progress Notes (Signed)
Physical Therapy Treatment Patient Details Name: Brittany Russo MRN: [id number] DOB: [birth year] Today's Date: 11/28/2018    History of Present Illness Pt is a 73 y.o. female with admissions 10/10/18 for lower GI bleed and 09/22/18 for R TKA with subsequent fall resulting in readmission with R distal femur fx s/p TKA repair; also with PMH includes HTN, DM2, obesity.  Pt admitted for repair right knee extensor mechanism on 11/22/18. 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    PT Comments    Pt motivated to progress to OOB mobility this session. Pt required mod assist +2 for transfer to recliner via stand pivot transfer, lift pad placed for RN/NT team to get pt back to bed. Pt is compliant with 50% WB RLE order. Pt with heavy posterior leaning this session requiring PT assist to correct, pt aware of leaning but unable to correct it. PT continuing to recommend SNF level of care post-acutely. Will continue to follow.     Follow Up Recommendations  SNF;Supervision/Assistance - 24 hour     Equipment Recommendations  None recommended by PT    Recommendations for Other Services       Precautions / Restrictions Precautions Precautions: Fall;Knee Precaution Comments: No ROM to R knee Required Braces or Orthoses: Other Brace Knee Immobilizer - Right: On at all times Other Brace: bledsoe brace locked in extension Restrictions Weight Bearing Restrictions: Yes RLE Weight Bearing: Partial weight bearing RLE Partial Weight Bearing Percentage or Pounds: 50%    Mobility  Bed Mobility Overal bed mobility: Needs Assistance Bed Mobility: Supine to Sit     Supine to sit: Mod assist     General bed mobility comments: Mod assist for RLE lifting, translation to EOB, scooting to EOB with use of bed pad. Verbal cuing for sequencing, pt slowly lowering RLE once at EOB. [place] locked in extension at all times.  Transfers Overall transfer level:  Needs assistance Equipment used: Rolling walker (2 wheeled) Transfers: Sit to/from Stand Sit to Stand: Mod assist;+2 physical assistance;+2 safety/equipment;From elevated surface         General transfer comment: Mod assist +2 for power up, steadying. Verbal cuing for hand placement when rising. Pt with heavy posterior leaning upon standing, requiring mod assist from PT to correct, along with frequent verbal and tactile cuing for upright posture and neutral standing. PT encouraged pt to keep RLE extended in front of LLE when standing to maintain 0* knee extension with brace. Pt able to pivot of LLE and take 2-3 small steps to get to recliner, mod assist for directing RW and steadying pt.  Ambulation/Gait Ambulation/Gait assistance: (NT)               Stairs             Wheelchair Mobility    Modified Rankin (Stroke Patients Only)       Balance Overall balance assessment: Needs assistance Sitting-balance support: Feet supported;Bilateral upper extremity supported Sitting balance-Leahy Scale: Fair   Postural control: Posterior lean Standing balance support: Bilateral upper extremity supported Standing balance-Leahy Scale: Poor Standing balance comment: heavily reliant on UEs throughout session for stability, strong posterior lean                            Cognition Arousal/Alertness: Awake/alert Behavior During Therapy: Anxious Overall Cognitive Status: Within Functional Limits for tasks assessed  Exercises Total Joint Exercises Ankle Circles/Pumps: AROM;Both;20 reps;Seated;AAROM    General Comments        Pertinent Vitals/Pain Pain Assessment: Faces Faces Pain Scale: Hurts little more Pain Location: RLE Pain Descriptors / Indicators: Aching;Sore Pain Intervention(s): Limited activity within patient's tolerance;Monitored during session;Repositioned    Home Living                       Prior Function            PT Goals (current goals can now be found in the care plan section) Acute Rehab PT Goals Patient Stated Goal: be able to walk PT Goal Formulation: With patient Time For Goal Achievement: 12/07/18 Potential to Achieve Goals: Good Progress towards PT goals: Progressing toward goals    Frequency    Min 3X/week      PT Plan Current plan remains appropriate    Co-evaluation              AM-PAC PT "6 Clicks" Mobility   Outcome Measure  Help needed turning from your back to your side while in a flat bed without using bedrails?: A Lot Help needed moving from lying on your back to sitting on the side of a flat bed without using bedrails?: A Lot Help needed moving to and from a bed to a chair (including a wheelchair)?: A Lot Help needed standing up from a chair using your arms (e.g., wheelchair or bedside chair)?: A Lot Help needed to walk in hospital room?: Total Help needed climbing 3-5 steps with a railing? : Total 6 Click Score: 10    End of Session Equipment Utilized During Treatment: Gait belt;Other (comment)(Bledsoe brace locked in  extension) Activity Tolerance: Patient limited by fatigue Patient left: with call bell/phone within reach;with bed alarm set;in chair;with chair alarm set(with lift pad under pt for RN/NT assist back to bed) Nurse Communication: Mobility status;Need for lift equipment(lift pad placed) PT Visit Diagnosis: Muscle weakness (generalized) (M62.81);Other abnormalities of gait and mobility (R26.89);Pain Pain - Right/Left: Right Pain - part of body: Knee     Time: [id number] PT Time Calculation (min) (ACUTE ONLY): 21 min  Charges:  $Therapeutic Activity: 8-22 mins                     Tamy Accardo Conception Chancy, PT Acute Rehabilitation Services Pager [phone]  Office [phone]    Edris Schneck D Elonda Husky 11/28/2018, 4:43 PM

## 2018-11-29 LAB — GLUCOSE, CAPILLARY
Glucose-Capillary: 43 mg/dL — CL (ref 70–99)
Glucose-Capillary: 113 mg/dL — ABNORMAL HIGH (ref 70–99)
Glucose-Capillary: 149 mg/dL — ABNORMAL HIGH (ref 70–99)
Glucose-Capillary: 220 mg/dL — ABNORMAL HIGH (ref 70–99)
Glucose-Capillary: 159 mg/dL — ABNORMAL HIGH (ref 70–99)

## 2018-11-29 NOTE — Telephone Encounter (Signed)
Noted  

## 2018-11-29 NOTE — Progress Notes (Signed)
Patient ID: Brittany Russo, female   DOB: 03/31/1945, 73 y.o.   MRN: [id number] Subjective: 7 Days Post-Op Procedure(s) (LRB): repair right knee extensor mechanism (Right)    Patient reports pain as mild with regards to her right knee - still not much activity  Objective:   VITALS:   Vitals:   11/28/18 2132 11/29/18 0637  BP: (!) 112/48 (!) 128/52  Pulse: 62 68  Resp: 14 18  Temp: 98.6 F (37 C) 98.4 F (36.9 C)  SpO2: 96% 94%    Neurovascular intact Incision: dressing C/D/I  Noted tight ankles  LABS Recent Labs    11/26/18 1550 11/28/18 0754  HGB 9.9* 9.7*  HCT 31.2* 29.9*  WBC 5.5 3.8*  PLT 64* 57*    No results for input(s): NA, K, BUN, CREATININE, GLUCOSE in the last 72 hours.  No results for input(s): LABPT, INR in the last 72 hours.  CLINICAL DATA:  Female genital tract fistula stool seen in vaginal vault. Rule our rectal vaginal fistula.  EXAM: CT ABDOMEN AND PELVIS WITH CONTRAST  TECHNIQUE: Multidetector CT imaging of the abdomen and pelvis was performed using the standard protocol following bolus administration of intravenous contrast.  CONTRAST:  147m OMNIPAQUE IOHEXOL 300 MG/ML SOLN, 35mOMNIPAQUE IOHEXOL 300 MG/ML SOLN  COMPARISON:  04/07/2017 CT chest abdomen and pelvis  FINDINGS: Lower chest: Stable 3 millimeter nodule at the LEFT lung base. The heart is enlarged. There is extensive atherosclerotic calcification of the coronary arteries.  Hepatobiliary: Perihepatic fluid. Nodular surface of the liver consistent with cirrhosis. Recanalized umbilical vein. No focal liver lesions. Focal fatty sparing in the region of the falciform ligament.  The gallbladder is distended and shows mural enhancement and mild irregularity. No radiopaque calculi are identified. No biliary duct dilatation.  Pancreas: Unremarkable. No pancreatic ductal dilatation or surrounding inflammatory changes.  Spleen: Enlarged spleen measures 13  centimeters in craniocaudal length, without focal lesion.  Adrenals/Urinary Tract: Normal adrenal glands. Kidneys are symmetric in size and enhancement. No renal mass. Ureters are unremarkable.  The bladder has a mildly thickened wall posteriorly. Contour of the bladder is normal. No air identified within the bladder.  Stomach/Bowel: The stomach and small bowel loops are normal in appearance. The large bowel is unremarkable. Moderate stool burden. The appendix is well seen and has a normal appearance.  On sagittal images, there is a soft tissue connection between the UPPER rectum in the region of the vaginal apex, representing a possible fistulous connection. This is best seen on axial image 79/2 and sagittal image 84/5. There is a small metal clip in the region of the vaginal apex, stable in appearance. Since prior exam, there has been an increase in the amount of perirectal stranding, consistent with inflammatory process.  Vascular/Lymphatic: There is atherosclerotic calcification of the abdominal aorta. No associated aneurysm. No retroperitoneal or mesenteric adenopathy.  Reproductive: Hysterectomy. No adnexal mass. See above for description of possible rectovaginal fistula.  Other: Small fat containing paraumbilical changes in the anterior hernia. Postoperative abdominal wall.  Musculoskeletal: No acute or significant osseous findings.  IMPRESSION: 1. Possible rectovaginal fistula between the UPPER rectum and vaginal apex. Prior hysterectomy. 2. Cirrhosis, splenomegaly, and portal venous hypertension. 3. Gallbladder is distended with irregular wall, raising the question of acute cholecystitis. Consider further evaluation with abdominal ultrasound. 4. Perihepatic fluid. 5. Coronary artery disease. 6. Aortic Atherosclerosis (ICD10-I70.0). 7. Small fat containing paraumbilical hernia.   Electronically Signed   By: ElNolon NationsM.D.   Assessment/Plan: 7  Days Post-Op Procedure(s) (LRB): repair right knee extensor mechanism (Right)  Possible rectal - vaginal fistula  Plan: Continue efforts with PT with knee brace locked in extension  I have consulted with general surgery to evaluate concerns raised by nursing and findings on CT scan  Disposition pending further workup and discussion with family

## 2018-11-29 NOTE — Consult Note (Signed)
[facility] Surgery Consult Note  [name] Nov 14, 1945  [id number].    Requesting MD: Paralee Cancel Chief Complaint/Reason for Consult: possible colovaginal fistula  HPI:  Brittany Russo is a 73yo female PMH DM, Anxiety, and HLD, who is currently admitted to [facility] with a complex right knee issue for which she underwent attempted repair of the extensor mechanism and retinaculum 11/24/18. During this admission nursing staff was concerned that he had stool in her vaginal vault. She does have a h/o endometrial cancer s/p hysterectomy 1996 and treated with radiation 2003/2004. States that she has had a few UTI's in the past although she does not think they are too frequent. She reports a history of IBS with intermittent diarrhea or constipation. Prior to admission she did not have any concerns for stool draining from her vagina. A CT scan was obtained and showed possible rectovaginal fistula between the UPPER rectum and vaginal apex.  General surgery asked to see. 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   ROS: Review of Systems  Constitutional: Negative.   HENT: Negative.   Eyes: Negative.   Respiratory: Negative.   Cardiovascular: Negative.   Gastrointestinal: Positive for diarrhea. Negative for abdominal pain, nausea and vomiting.  Genitourinary: Positive for dysuria and hematuria.  Musculoskeletal: Positive for joint pain.  Skin: Negative.   Neurological: Negative.    All systems reviewed and otherwise negative except for as above  Family History  Problem Relation Age of Onset  . Heart disease Mother   . Diabetes Mother   . Heart disease Father   . Cancer Father   . Cancer Maternal Grandmother   . Heart disease Maternal Grandfather     Past Medical History:  Diagnosis Date  . Acute and subacute hepatic failure without coma   . Allergy   . Anasarca   . Anxiety   . Arthritis   . Ascites 09/2018   no SBP on 850 cc tap 10/13/18  .  Cirrhosis of liver ([place]) ~ 2004   from NAFLD.  Dx ~ 2004.  Dr Dorcas Mcmurray at [facility]/    . Depression   . Diabetes mellitus without complication ([place])    type 2  . Eczema of both hands   . Endometrial cancer ([place]) 1996, 2003   hysterectomy 1996.  recurrence 2003, treated with radiation.    . Generalized edema   . GI hemorrhage   . Headache   . 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 in [place]  . Hyperlipidemia   . Hypertension   . Muscle weakness   . Seizures ([place])    last seizure June 07, 2014   . Thrombocytopenia ([place])   . Thyroid nodule     Past Surgical History:  Procedure Laterality Date  . CATARACT EXTRACTION W/PHACO Left 06/04/2017   Procedure: CATARACT EXTRACTION PHACO AND INTRAOCULAR LENS PLACEMENT (IOC);  Surgeon: Baruch Goldmann, MD;  Location: [facility];  Service: Ophthalmology;  Laterality: Left;  CDE: 3.90  . CATARACT EXTRACTION W/PHACO Right 07/15/2017   Procedure: CATARACT EXTRACTION PHACO AND INTRAOCULAR LENS PLACEMENT (IOC);  Surgeon: Baruch Goldmann, MD;  Location: [facility];  Service: Ophthalmology;  Laterality: Right;  CDE: 7.60  . COLONOSCOPY  06/2007   in [place]. for hematochzia.  radiation proctitis  . DILATION AND CURETTAGE OF UTERUS    . ESOPHAGOGASTRODUODENOSCOPY  2016  . IR PARACENTESIS  10/13/2018  . TOTAL ABDOMINAL HYSTERECTOMY  1996  . TOTAL KNEE  ARTHROPLASTY Right 09/22/2018   Procedure: TOTAL KNEE ARTHROPLASTY;  Surgeon: Paralee Cancel, MD;  Location: [facility];  Service: Orthopedics;  Laterality: Right;  70 mins  . TOTAL KNEE REVISION Right 09/27/2018   Procedure: TOTAL KNEE REVISION;  Surgeon: Paralee Cancel, MD;  Location: [facility];  Service: Orthopedics;  Laterality: Right;  . TOTAL KNEE REVISION Right 11/22/2018   Procedure: repair right knee extensor mechanism;  Surgeon: Paralee Cancel, MD;  Location: [facility];  Service: Orthopedics;  Laterality: Right;  90 mins    Social History:  reports that she has  never smoked. She has never used smokeless tobacco. She reports that she does not drink alcohol or use drugs.  Allergies:  Allergies  Allergen Reactions  . Asa [Aspirin] Other (See Comments)    Pt not allergic but cannot take due to bleeding   . Other Other (See Comments)    Any jewelry metal-hives and itching     Medications Prior to Admission  Medication Sig Dispense Refill  . ALPRAZolam (XANAX) 0.25 MG tablet Take 0.25 mg by mouth 2 (two) times daily.    [name] atenolol (TENORMIN) 50 MG tablet Take 50 mg by mouth daily.    [name] atorvastatin (LIPITOR) 80 MG tablet Take 80 mg by mouth daily at 8 pm.    . buPROPion (WELLBUTRIN XL) 150 MG 24 hr tablet Take 150 mg by mouth daily.    . celecoxib (CELEBREX) 200 MG capsule Take 1 capsule (200 mg total) by mouth 2 (two) times daily. 60 capsule 0  . citalopram (CELEXA) 40 MG tablet Take 40 mg by mouth daily.    [name] dextrose (GLUCO BURST) 40 % GEL Take 1 Tube by mouth once as needed (blood sugar less than 60).    [name] diltiazem (CARTIA XT) 180 MG 24 hr capsule Take 180 mg by mouth daily.    . fluticasone (FLONASE) 50 MCG/ACT nasal spray Place 1 spray into both nostrils daily.     . furosemide (LASIX) 40 MG tablet Take 1 tablet (40 mg total) by mouth daily. 30 tablet 0  . glipiZIDE (GLUCOTROL) 5 MG tablet Take 5 mg by mouth 2 (two) times daily.    [name] HYDROcodone-acetaminophen (NORCO/VICODIN) 5-325 MG tablet Take 1 tablet by mouth every 6 (six) hours as needed (PAIN).    [name] insulin glargine (LANTUS) 100 UNIT/ML injection Inject 65 Units into the skin daily at 6 (six) AM.    . loratadine (CLARITIN) 10 MG tablet Take 10 mg by mouth daily.    . montelukast (SINGULAIR) 10 MG tablet Take 10 mg by mouth at bedtime.    . ondansetron (ZOFRAN) 4 MG tablet Take 1 tablet (4 mg total) by mouth every 6 (six) hours as needed for nausea. 20 tablet 0  . potassium chloride (K-DUR) 10 MEQ tablet Take 10 mEq by mouth daily.    . rifaximin (XIFAXAN) 550 MG TABS tablet Take 550 mg  by mouth 2 (two) times daily.    . sitaGLIPtin-metformin (JANUMET) 50-500 MG per tablet Take 1 tablet by mouth 2 (two) times daily with a meal.    . spironolactone (ALDACTONE) 50 MG tablet Take 1 tablet (50 mg total) by mouth daily. 30 tablet 0  . ferrous sulfate 325 (65 FE) MG tablet Take 1 tablet (325 mg total) by mouth 2 (two) times daily with a meal for 14 days. (Patient not taking: Reported on 11/17/2018) 28 tablet 0  . glucagon (GLUCAGON EMERGENCY) 1 MG injection Inject 1 mg into the muscle  once as needed (blood sugar less than 60).      Prior to Admission medications   Medication Sig Start Date End Date Taking? Authorizing Provider  ALPRAZolam (XANAX) 0.25 MG tablet Take 0.25 mg by mouth 2 (two) times daily.   Yes [provider]  atenolol (TENORMIN) 50 MG tablet Take 50 mg by mouth daily.   Yes [provider]  atorvastatin (LIPITOR) 80 MG tablet Take 80 mg by mouth daily at 8 pm.   Yes [provider]  buPROPion (WELLBUTRIN XL) 150 MG 24 hr tablet Take 150 mg by mouth daily.   Yes [provider]  celecoxib (CELEBREX) 200 MG capsule Take 1 capsule (200 mg total) by mouth 2 (two) times daily. 09/30/18  Yes Maurice March, PA-C  citalopram (CELEXA) 40 MG tablet Take 40 mg by mouth daily.   Yes [provider]  dextrose (GLUCO BURST) 40 % GEL Take 1 Tube by mouth once as needed (blood sugar less than 60).   Yes [provider]  diltiazem (CARTIA XT) 180 MG 24 hr capsule Take 180 mg by mouth daily.   Yes [provider]  fluticasone (FLONASE) 50 MCG/ACT nasal spray Place 1 spray into both nostrils daily.    Yes [provider]  furosemide (LASIX) 40 MG tablet Take 1 tablet (40 mg total) by mouth daily. 10/14/18 11/17/19 Yes Amin, Ankit Chirag, MD  glipiZIDE (GLUCOTROL) 5 MG tablet Take 5 mg by mouth 2 (two) times daily.   Yes [provider]  HYDROcodone-acetaminophen (NORCO/VICODIN) 5-325 MG tablet Take 1 tablet  by mouth every 6 (six) hours as needed (PAIN).   Yes [provider]  insulin glargine (LANTUS) 100 UNIT/ML injection Inject 65 Units into the skin daily at 6 (six) AM.   Yes [provider]  loratadine (CLARITIN) 10 MG tablet Take 10 mg by mouth daily.   Yes [provider]  montelukast (SINGULAIR) 10 MG tablet Take 10 mg by mouth at bedtime.   Yes [provider]  ondansetron (ZOFRAN) 4 MG tablet Take 1 tablet (4 mg total) by mouth every 6 (six) hours as needed for nausea. 09/30/18  Yes Griffith Citron R, PA-C  potassium chloride (K-DUR) 10 MEQ tablet Take 10 mEq by mouth daily.   Yes [provider]  rifaximin (XIFAXAN) 550 MG TABS tablet Take 550 mg by mouth 2 (two) times daily.   Yes [provider]  sitaGLIPtin-metformin (JANUMET) 50-500 MG per tablet Take 1 tablet by mouth 2 (two) times daily with a meal.   Yes [provider]  spironolactone (ALDACTONE) 50 MG tablet Take 1 tablet (50 mg total) by mouth daily. 10/14/18 11/17/19 Yes Amin, Jeanella Flattery, MD  ferrous sulfate 325 (65 FE) MG tablet Take 1 tablet (325 mg total) by mouth 2 (two) times daily with a meal for 14 days. Patient not taking: Reported on 11/17/2018 09/23/18 10/14/18  Maurice March, PA-C  glucagon (GLUCAGON EMERGENCY) 1 MG injection Inject 1 mg into the muscle once as needed (blood sugar less than 60).    [provider]  Ibuprofen-Diphenhydramine Cit (ADVIL PM PO) Take 60 mg by mouth at bedtime.  05/21/17  [provider]    Blood pressure (!) 128/57, pulse 71, temperature 97.8 F (36.6 C), temperature source Oral, resp. rate 16, height 5' 2"  (1.575 m), weight 73.1 kg, SpO2 99 %. Physical Exam: General: pleasant, WD/WN white female who is laying in bed in NAD HEENT: head is normocephalic,  atraumatic.  Sclera are noninjected.  Pupils equal and round.  Ears and nose without any masses or lesions.  Mouth is pink and moist. Dentition fair Heart:  regular, rate, and rhythm.  No obvious murmurs, gallops, or rubs noted.  Palpable pedal pulses bilaterally Lungs: CTAB, no wheezes, rhonchi, or rales noted.  Respiratory effort nonlabored Abd: soft, NT/ND, +BS, no masses, hernias, or organomegaly MS: 1+ edema BLE. ACE wrap and hinged knee brace to RLE Skin: warm and dry with no masses, lesions, or rashes Psych: A&Ox3 with an appropriate affect. Neuro: cranial nerves grossly intact, moving all 4 extremities, normal speech GU: stool noted perivaginal, possible stool on vaginal exam although difficult to tell if it was draining from her vagina or stool from rectum and gone up into her vagina  Results for orders placed or performed during the hospital encounter of 11/22/18 (from the past 48 hour(s))  Glucose, capillary     Status: Abnormal   Collection Time: 11/27/18  4:37 PM  Result Value Ref Range   Glucose-Capillary 161 (H) 70 - 99 mg/dL  Glucose, capillary     Status: Abnormal   Collection Time: 11/27/18 10:41 PM  Result Value Ref Range   Glucose-Capillary 130 (H) 70 - 99 mg/dL  Glucose, capillary     Status: Abnormal   Collection Time: 11/28/18  7:21 AM  Result Value Ref Range   Glucose-Capillary 169 (H) 70 - 99 mg/dL  CBC     Status: Abnormal   Collection Time: 11/28/18  7:54 AM  Result Value Ref Range   WBC 3.8 (L) 4.0 - 10.5 K/uL   RBC 3.09 (L) 3.87 - 5.11 MIL/uL   Hemoglobin 9.7 (L) 12.0 - 15.0 g/dL   HCT 29.9 (L) 36.0 - 46.0 %   MCV 96.8 80.0 - 100.0 fL   MCH 31.4 26.0 - 34.0 pg   MCHC 32.4 30.0 - 36.0 g/dL   RDW 16.4 (H) 11.5 - 15.5 %   Platelets 57 (L) 150 - 400 K/uL    Comment: Immature Platelet Fraction may be clinically indicated, consider ordering this additional test [id number]    nRBC 0.0 0.0 - 0.2 %    Comment: Performed at [facility], [place] [address] [place], [place] [address]  Glucose, capillary     Status: Abnormal   Collection Time: 11/28/18  3:14 PM  Result Value Ref Range    Glucose-Capillary 217 (H) 70 - 99 mg/dL  Glucose, capillary     Status: Abnormal   Collection Time: 11/28/18  9:42 PM  Result Value Ref Range   Glucose-Capillary 143 (H) 70 - 99 mg/dL  Glucose, capillary     Status: Abnormal   Collection Time: 11/29/18  7:39 AM  Result Value Ref Range   Glucose-Capillary 113 (H) 70 - 99 mg/dL  Glucose, capillary     Status: Abnormal   Collection Time: 11/29/18 12:12 PM  Result Value Ref Range   Glucose-Capillary 149 (H) 70 - 99 mg/dL   Ct Abdomen Pelvis W Contrast  Result Date: 11/28/2018 CLINICAL DATA:  Female genital tract fistula stool seen in vaginal vault. Rule our rectal vaginal fistula. EXAM: CT ABDOMEN AND PELVIS WITH CONTRAST TECHNIQUE: Multidetector CT imaging of the abdomen and pelvis was performed using the standard protocol following bolus administration of intravenous contrast. CONTRAST:  166m OMNIPAQUE IOHEXOL 300 MG/ML SOLN, 329mOMNIPAQUE IOHEXOL 300 MG/ML SOLN COMPARISON:  04/07/2017 CT chest abdomen and pelvis FINDINGS: Lower chest: Stable 3 millimeter nodule at the LEFT  lung base. The heart is enlarged. There is extensive atherosclerotic calcification of the coronary arteries. Hepatobiliary: Perihepatic fluid. Nodular surface of the liver consistent with cirrhosis. Recanalized umbilical vein. No focal liver lesions. Focal fatty sparing in the region of the falciform ligament. The gallbladder is distended and shows mural enhancement and mild irregularity. No radiopaque calculi are identified. No biliary duct dilatation. Pancreas: Unremarkable. No pancreatic ductal dilatation or surrounding inflammatory changes. Spleen: Enlarged spleen measures 13 centimeters in craniocaudal length, without focal lesion. Adrenals/Urinary Tract: Normal adrenal glands. Kidneys are symmetric in size and enhancement. No renal mass. Ureters are unremarkable. The bladder has a mildly thickened wall posteriorly. Contour of the bladder is normal. No air identified within  the bladder. Stomach/Bowel: The stomach and small bowel loops are normal in appearance. The large bowel is unremarkable. Moderate stool burden. The appendix is well seen and has a normal appearance. On sagittal images, there is a soft tissue connection between the UPPER rectum in the region of the vaginal apex, representing a possible fistulous connection. This is best seen on axial image 79/2 and sagittal image 84/5. There is a small metal clip in the region of the vaginal apex, stable in appearance. Since prior exam, there has been an increase in the amount of perirectal stranding, consistent with inflammatory process. Vascular/Lymphatic: There is atherosclerotic calcification of the abdominal aorta. No associated aneurysm. No retroperitoneal or mesenteric adenopathy. Reproductive: Hysterectomy. No adnexal mass. See above for description of possible rectovaginal fistula. Other: Small fat containing paraumbilical changes in the anterior hernia. Postoperative abdominal wall. Musculoskeletal: No acute or significant osseous findings. IMPRESSION: 1. Possible rectovaginal fistula between the UPPER rectum and vaginal apex. Prior hysterectomy. 2. Cirrhosis, splenomegaly, and portal venous hypertension. 3. Gallbladder is distended with irregular wall, raising the question of acute cholecystitis. Consider further evaluation with abdominal ultrasound. 4. Perihepatic fluid. 5. Coronary artery disease. 6. Aortic Atherosclerosis (ICD10-I70.0). 7. Small fat containing paraumbilical hernia. Electronically Signed   By: Nolon Nations M.D.   On: 11/28/2018 13:08   Dg Abd Portable 1v  Result Date: 11/28/2018 CLINICAL DATA:  Ileus, postop. Knee arthroplasty revision 6 days ago EXAM: PORTABLE ABDOMEN - 1 VIEW COMPARISON:  Abdomen and pelvis CT 04/07/2017 FINDINGS: Formed stool throughout most of the colon. No rectal distention. No gas dilated small bowel. No concerning mass effect or gas collection. No acute osseous finding.  IMPRESSION: Generalized colonic stool.  No evident obstruction or ileus. Electronically Signed   By: Monte Fantasia M.D.   On: 11/28/2018 09:52   Anti-infectives (From admission, onward)   Start     Dose/Rate Route Frequency Ordered Stop   11/23/18 0600  ceFAZolin (ANCEF) IVPB 2g/100 mL premix  Status:  Discontinued     2 g 200 mL/hr over 30 Minutes Intravenous On call to O.R. 11/22/18 1326 11/22/18 1328   11/22/18 2230  ceFAZolin (ANCEF) IVPB 2g/100 mL premix     2 g 200 mL/hr over 30 Minutes Intravenous Every 6 hours 11/22/18 1903 11/23/18 0549   11/22/18 2200  rifaximin (XIFAXAN) tablet 550 mg     550 mg Oral 2 times daily 11/22/18 1903     11/22/18 1330  ceFAZolin (ANCEF) IVPB 2g/100 mL premix     2 g 200 mL/hr over 30 Minutes Intravenous On call to O.R. 11/22/18 1327 11/22/18 1653        Assessment/Plan Complex disruption to the inferior extensor mechanism below the patella S/p attempted repair of the extensor mechanism and retinaculum in the right knee  11/24/18 Dr. Alvan Dame DM Anxiety HLD IBS H/o endometrial cancer treated with radiation 2003/2004, hysterectomy 1996  Possible rectovaginal fistula  - CT scan and physical exam concerning for possible rectovaginal fistula. Will obtain a contrast enema for further evaluation.   Wellington Hampshire, [facility] Surgery 11/29/2018, 3:49 PM Pager: [phone] Mon-Thurs 7:00 am-4:30 pm Fri 7:00 am -11:30 AM Sat-Sun 7:00 am-11:30 am

## 2018-11-30 ENCOUNTER — Inpatient Hospital Stay (HOSPITAL_COMMUNITY): Payer: Medicare Other

## 2018-11-30 LAB — GLUCOSE, CAPILLARY
Glucose-Capillary: 140 mg/dL — ABNORMAL HIGH (ref 70–99)
Glucose-Capillary: 107 mg/dL — ABNORMAL HIGH (ref 70–99)
Glucose-Capillary: 90 mg/dL (ref 70–99)

## 2018-11-30 MED ORDER — PEG 3350-KCL-NA BICARB-NACL 420 G PO SOLR
4000.0000 mL | Freq: Once | ORAL | Status: AC
  Administered 2018-11-30: 15:00:00 4000 mL via ORAL

## 2018-11-30 MED ORDER — DEXTROSE 5 % IV SOLN
INTRAVENOUS | Status: DC
  Administered 2018-12-01 – 2018-12-07 (×6): via INTRAVENOUS

## 2018-11-30 NOTE — Plan of Care (Signed)

## 2018-11-30 NOTE — Progress Notes (Signed)
[facility] Progress Note  8 Days Post-Op  Subjective: CC-  No new complaints. Slept well last night. Denies abdominal pain, nausea, or vomiting. Having bowel function. Going for contrast enema later this morning.  Objective: Vital signs in last 24 hours: Temp:  [97.8 F (36.6 C)-98.2 F (36.8 C)] 98.2 F (36.8 C) (09/09 0422) Pulse Rate:  [60-74] 60 (09/09 0422) Resp:  [16] 16 (09/09 0422) BP: (122-139)/(57-65) 139/65 (09/09 0422) SpO2:  [97 %-100 %] 97 % (09/09 0422) Last BM Date: 11/29/18  Intake/Output from previous day: 09/08 0701 - 09/09 0700 In: 660 [P.O.:660] Out: 600 [Urine:600] Intake/Output this shift: No intake/output data recorded.  PE: Gen:  Alert, NAD, pleasant HEENT: EOM's intact, pupils equal and round Pulm:  Rate and effort normal Abd: Soft, NT/ND, +BS, no HSM Ext:  N1+ edema BLE. ACE wrap and hinged knee brace to RLE Psych: A&Ox3  Skin: no rashes noted, warm and dry GU: exam performed yesterday, will defer today and await contrast enema results  Lab Results:  Recent Labs    11/28/18 0754  WBC 3.8*  HGB 9.7*  HCT 29.9*  PLT 57*   BMET No results for input(s): NA, K, CL, CO2, GLUCOSE, BUN, CREATININE, CALCIUM in the last 72 hours. PT/INR No results for input(s): LABPROT, INR in the last 72 hours. CMP     Component Value Date/Time   NA 130 (L) 11/24/2018 0232   NA 130 (A) 06/10/2015   K 4.6 11/24/2018 0232   CL 101 11/24/2018 0232   CO2 18 (L) 11/24/2018 0232   GLUCOSE 259 (H) 11/24/2018 0232   BUN 30 (H) 11/24/2018 0232   BUN 14 06/10/2015   CREATININE 1.24 (H) 11/24/2018 0232   CALCIUM 8.0 (L) 11/24/2018 0232   PROT 4.9 (L) 10/14/2018 0539   ALBUMIN 2.0 (L) 10/14/2018 0539   AST 43 (H) 10/14/2018 0539   ALT 32 10/14/2018 0539   ALKPHOS 128 (H) 10/14/2018 0539   BILITOT 1.0 10/14/2018 0539   GFRNONAA 44 (L) 11/24/2018 0232   GFRAA 51 (L) 11/24/2018 0232   Lipase  No results found for:  LIPASE     Studies/Results: Ct Abdomen Pelvis W Contrast  Result Date: 11/28/2018 CLINICAL DATA:  Female genital tract fistula stool seen in vaginal vault. Rule our rectal vaginal fistula. EXAM: CT ABDOMEN AND PELVIS WITH CONTRAST TECHNIQUE: Multidetector CT imaging of the abdomen and pelvis was performed using the standard protocol following bolus administration of intravenous contrast. CONTRAST:  185m OMNIPAQUE IOHEXOL 300 MG/ML SOLN, 326mOMNIPAQUE IOHEXOL 300 MG/ML SOLN COMPARISON:  04/07/2017 CT chest abdomen and pelvis FINDINGS: Lower chest: Stable 3 millimeter nodule at the LEFT lung base. The heart is enlarged. There is extensive atherosclerotic calcification of the coronary arteries. Hepatobiliary: Perihepatic fluid. Nodular surface of the liver consistent with cirrhosis. Recanalized umbilical vein. No focal liver lesions. Focal fatty sparing in the region of the falciform ligament. The gallbladder is distended and shows mural enhancement and mild irregularity. No radiopaque calculi are identified. No biliary duct dilatation. Pancreas: Unremarkable. No pancreatic ductal dilatation or surrounding inflammatory changes. Spleen: Enlarged spleen measures 13 centimeters in craniocaudal length, without focal lesion. Adrenals/Urinary Tract: Normal adrenal glands. Kidneys are symmetric in size and enhancement. No renal mass. Ureters are unremarkable. The bladder has a mildly thickened wall posteriorly. Contour of the bladder is normal. No air identified within the bladder. Stomach/Bowel: The stomach and small bowel loops are normal in appearance. The large bowel is unremarkable. Moderate stool burden. The  appendix is well seen and has a normal appearance. On sagittal images, there is a soft tissue connection between the UPPER rectum in the region of the vaginal apex, representing a possible fistulous connection. This is best seen on axial image 79/2 and sagittal image 84/5. There is a small metal clip in the  region of the vaginal apex, stable in appearance. Since prior exam, there has been an increase in the amount of perirectal stranding, consistent with inflammatory process. Vascular/Lymphatic: There is atherosclerotic calcification of the abdominal aorta. No associated aneurysm. No retroperitoneal or mesenteric adenopathy. Reproductive: Hysterectomy. No adnexal mass. See above for description of possible rectovaginal fistula. Other: Small fat containing paraumbilical changes in the anterior hernia. Postoperative abdominal wall. Musculoskeletal: No acute or significant osseous findings. IMPRESSION: 1. Possible rectovaginal fistula between the UPPER rectum and vaginal apex. Prior hysterectomy. 2. Cirrhosis, splenomegaly, and portal venous hypertension. 3. Gallbladder is distended with irregular wall, raising the question of acute cholecystitis. Consider further evaluation with abdominal ultrasound. 4. Perihepatic fluid. 5. Coronary artery disease. 6. Aortic Atherosclerosis (ICD10-I70.0). 7. Small fat containing paraumbilical hernia. Electronically Signed   By: Nolon Nations M.D.   On: 11/28/2018 13:08    Anti-infectives: Anti-infectives (From admission, onward)   Start     Dose/Rate Route Frequency Ordered Stop   11/23/18 0600  ceFAZolin (ANCEF) IVPB 2g/100 mL premix  Status:  Discontinued     2 g 200 mL/hr over 30 Minutes Intravenous On call to O.R. 11/22/18 1326 11/22/18 1328   11/22/18 2230  ceFAZolin (ANCEF) IVPB 2g/100 mL premix     2 g 200 mL/hr over 30 Minutes Intravenous Every 6 hours 11/22/18 1903 11/23/18 0549   11/22/18 2200  rifaximin (XIFAXAN) tablet 550 mg     550 mg Oral 2 times daily 11/22/18 1903     11/22/18 1330  ceFAZolin (ANCEF) IVPB 2g/100 mL premix     2 g 200 mL/hr over 30 Minutes Intravenous On call to O.R. 11/22/18 1327 11/22/18 1653       Assessment/Plan Complex disruption to the inferior extensor mechanism below the patella S/p attempted repair of the extensor  mechanism and retinaculum in the right knee 11/24/18 Dr. Alvan Dame DM Anxiety HLD IBS  Possible rectovaginal fistula  - H/o endometrial cancer treated with radiation 2003/2004, hysterectomy 1996 - CT scan and physical exam concerning for possible rectovaginal fistula. - Contrast enema today. Will make further recommendations after this study is complete.  ID - currently rifaximin 9/1>> FEN - IVF, NPO for procedure VTE - SCD, per primary Foley - none Follow up - TBD    LOS: 8 days    Wellington Hampshire , [facility] Surgery 11/30/2018, 9:53 AM Pager: [phone] Mon-Thurs 7:00 am-4:30 pm Fri 7:00 am -11:30 AM Sat-Sun 7:00 am-11:30 am

## 2018-11-30 NOTE — Progress Notes (Signed)
     Subjective: 8 Days Post-Op Procedure(s) (LRB): repair right knee extensor mechanism (Right)   Patient reports pain as mild, pain controlled.  No recent events with regards to the right knee.  Patient does understand her situation regarding a rectovaginal fistula.  Patient also understands that she will have a contrast enema to evaluate this issue further.  Otherwise the patient is resting comfortably.     Objective:   VITALS:   Vitals:   11/29/18 2029 11/30/18 0422  BP: 122/62 139/65  Pulse: 62 60  Resp: 16 16  Temp: 98.2 F (36.8 C) 98.2 F (36.8 C)  SpO2: 100% 97%    Dorsiflexion/Plantar flexion intact Incision: dressing C/D/I No cellulitis present Compartment soft  LABS Recent Labs    11/28/18 0754  HGB 9.7*  HCT 29.9*  WBC 3.8*  PLT 57*    No results for input(s): NA, K, BUN, CREATININE, GLUCOSE in the last 72 hours.   Assessment/Plan: 8 Days Post-Op Procedure(s) (LRB): repair right knee extensor mechanism (Right)  General surgery was performed yesterday, further procedures needed Up with therapy, as able with other procedures scheduled Discharge TBD  [place]. [place]an [place]   PAC  11/30/2018, 9:42 AM

## 2018-11-30 NOTE — Progress Notes (Signed)
PT Cancellation Note  Patient Details Name: [name] MRN: [id number] DOB: 1945-09-07   Cancelled Treatment:    Reason Eval/Treat Not Completed: Patient at procedure or test/unavailable - will check back as schedule allows.  Julien Girt, PT Acute Rehabilitation Services Pager [phone]  Office [place] 11/30/2018, 12:07 PM

## 2018-12-01 ENCOUNTER — Inpatient Hospital Stay (HOSPITAL_COMMUNITY): Payer: Medicare Other

## 2018-12-01 LAB — GLUCOSE, CAPILLARY
Glucose-Capillary: 107 mg/dL — ABNORMAL HIGH (ref 70–99)
Glucose-Capillary: 132 mg/dL — ABNORMAL HIGH (ref 70–99)
Glucose-Capillary: 105 mg/dL — ABNORMAL HIGH (ref 70–99)

## 2018-12-01 NOTE — Plan of Care (Signed)

## 2018-12-01 NOTE — Progress Notes (Signed)
Patient ID: Brittany Russo, female   DOB: 1946-02-02, 73 y.o.   MRN: [id number] Subjective: 9 Days Post-Op Procedure(s) (LRB): repair right knee extensor mechanism (Right)    Patient reports pain as mild. Feels sleepy Waiting on GSU work up to rule out rectal vaginal fistula.  Not possible yesterday due to retained contrast from prior contrasted study  Objective:   VITALS:   Vitals:   11/30/18 2128 12/01/18 0557  BP: 123/60 (!) 133/55  Pulse: 61 68  Resp: 16 18  Temp: 98.1 F (36.7 C) 97.9 F (36.6 C)  SpO2: 95% 92%    Neurovascular intact Incision: dressing C/D/I  LABS No results for input(s): HGB, HCT, WBC, PLT in the last 72 hours.  No results for input(s): NA, K, BUN, CREATININE, GLUCOSE in the last 72 hours.  No results for input(s): LABPT, INR in the last 72 hours.   Assessment/Plan: 9 Days Post-Op Procedure(s) (LRB): repair right knee extensor mechanism (Right)   Up with therapy  UE strengthening!! Work on at least transfer ability from bed to chair and vice versa with knee brace on locked in full extension at all times to allow for healing of soft tissues  Disposition to SNF once work up and treatment decided on question of rectal vaginal fistula  ACE wrap off today

## 2018-12-01 NOTE — Progress Notes (Signed)
[facility] Progress Note  9 Days Post-Op  Subjective: CC-  Unable to have contrast enema yesterday because her colon was full of stool. Completed bowel prep yesterday and had multiple bowel movements. Denies abdominal pain, nausea, vomiting.  Objective: Vital signs in last 24 hours: Temp:  [97.8 F (36.6 C)-98.1 F (36.7 C)] 97.9 F (36.6 C) (09/10 0557) Pulse Rate:  [58-68] 68 (09/10 0557) Resp:  [16-18] 18 (09/10 0557) BP: (123-133)/(55-61) 133/55 (09/10 0557) SpO2:  [92 %-100 %] 92 % (09/10 0557) Last BM Date: 11/30/18  Intake/Output from previous day: 09/09 0701 - 09/10 0700 In: 700 [P.O.:150; I.V.:550] Out: 900 [Urine:900] Intake/Output this shift: No intake/output data recorded.  PE: Gen:  Alert, NAD, pleasant HEENT: EOM's intact, pupils equal and round Pulm:  CTAB, rate and effort normal Cardio: RRR Abd: Soft, NT/ND, +BS, no HSM Psych: A&Ox3  Skin: no rashes noted, warm and dry GU: defered today and await contrast enema results   Lab Results:  No results for input(s): WBC, HGB, HCT, PLT in the last 72 hours. BMET No results for input(s): NA, K, CL, CO2, GLUCOSE, BUN, CREATININE, CALCIUM in the last 72 hours. PT/INR No results for input(s): LABPROT, INR in the last 72 hours. CMP     Component Value Date/Time   NA 130 (L) 11/24/2018 0232   NA 130 (A) 06/10/2015   K 4.6 11/24/2018 0232   CL 101 11/24/2018 0232   CO2 18 (L) 11/24/2018 0232   GLUCOSE 259 (H) 11/24/2018 0232   BUN 30 (H) 11/24/2018 0232   BUN 14 06/10/2015   CREATININE 1.24 (H) 11/24/2018 0232   CALCIUM 8.0 (L) 11/24/2018 0232   PROT 4.9 (L) 10/14/2018 0539   ALBUMIN 2.0 (L) 10/14/2018 0539   AST 43 (H) 10/14/2018 0539   ALT 32 10/14/2018 0539   ALKPHOS 128 (H) 10/14/2018 0539   BILITOT 1.0 10/14/2018 0539   GFRNONAA 44 (L) 11/24/2018 0232   GFRAA 51 (L) 11/24/2018 0232   Lipase  No results found for: LIPASE     Studies/Results: Dg Abd 1 View  Result Date:  11/30/2018 CLINICAL DATA:  Attempted be to check for rectovaginal fistula, retained contrast from CT EXAM: ABDOMEN - 1 VIEW COMPARISON:  CT 11/28/2018 FINDINGS: Single spot fluoroscopic view of the abdomen. Residual contrast material is present within the colon. Gas pattern is nonobstructed. A rectal catheter is noted. Barium enema subsequently not performed. IMPRESSION: Residual contrast within the colon.  Nonobstructed gas pattern Electronically Signed   By: Donavan Foil M.D.   On: 11/30/2018 13:48   Dg Abd Portable 1v  Result Date: 12/01/2018 CLINICAL DATA:  Evaluate for retained barium EXAM: PORTABLE ABDOMEN - 1 VIEW COMPARISON:  11/30/2018 FINDINGS: Scattered large and small bowel gas is noted. Contrast material remains predominately within the left colon slightly migrated when compared with the prior exam. No free air is seen. No bony abnormality is noted. IMPRESSION: Considerable retained colonic contrast from prior CT. Electronically Signed   By: Inez Catalina M.D.   On: 12/01/2018 08:01    Anti-infectives: Anti-infectives (From admission, onward)   Start     Dose/Rate Route Frequency Ordered Stop   11/23/18 0600  ceFAZolin (ANCEF) IVPB 2g/100 mL premix  Status:  Discontinued     2 g 200 mL/hr over 30 Minutes Intravenous On call to O.R. 11/22/18 1326 11/22/18 1328   11/22/18 2230  ceFAZolin (ANCEF) IVPB 2g/100 mL premix     2 g 200 mL/hr over 30 Minutes  Intravenous Every 6 hours 11/22/18 1903 11/23/18 0549   11/22/18 2200  rifaximin (XIFAXAN) tablet 550 mg     550 mg Oral 2 times daily 11/22/18 1903     11/22/18 1330  ceFAZolin (ANCEF) IVPB 2g/100 mL premix     2 g 200 mL/hr over 30 Minutes Intravenous On call to O.R. 11/22/18 1327 11/22/18 1653       Assessment/Plan Complexdisruption to the inferior extensor mechanism below the patella S/p attemptedrepair of the extensor mechanism and retinaculum in the right knee9/3/20 Dr. Alvan Dame DM Anxiety HLD IBS  Possible rectovaginal  fistula - H/o endometrial cancer treated with radiation 2003/2004, hysterectomy 1996 - CT scan and physical exam concerning for possible rectovaginal fistula. - Successful bowel prep yesterday. Contrast enema today. Will make further recommendations after this study is complete.  ID - currently rifaximin 9/1>> FEN - IVF, NPO for procedure VTE - SCD, per primary Foley - none Follow up - TBD    LOS: 9 days    Wellington Hampshire , [facility] Surgery 12/01/2018, 9:01 AM Pager: [phone] Mon-Thurs 7:00 am-4:30 pm Fri 7:00 am -11:30 AM Sat-Sun 7:00 am-11:30 am

## 2018-12-02 ENCOUNTER — Inpatient Hospital Stay (HOSPITAL_COMMUNITY): Payer: Medicare Other

## 2018-12-02 LAB — GLUCOSE, CAPILLARY
Glucose-Capillary: 103 mg/dL — ABNORMAL HIGH (ref 70–99)
Glucose-Capillary: 66 mg/dL — ABNORMAL LOW (ref 70–99)
Glucose-Capillary: 77 mg/dL (ref 70–99)
Glucose-Capillary: 131 mg/dL — ABNORMAL HIGH (ref 70–99)
Glucose-Capillary: 68 mg/dL — ABNORMAL LOW (ref 70–99)
Glucose-Capillary: 89 mg/dL (ref 70–99)
Glucose-Capillary: 252 mg/dL — ABNORMAL HIGH (ref 70–99)

## 2018-12-02 MED ORDER — IOHEXOL 300 MG/ML  SOLN
80.0000 mL | Freq: Once | INTRAMUSCULAR | Status: AC | PRN
  Administered 2018-12-02: 12:00:00 80 mL via INTRAVENOUS

## 2018-12-02 MED ORDER — IOHEXOL 300 MG/ML  SOLN
25.0000 mL | Freq: Once | INTRAMUSCULAR | Status: AC | PRN
  Administered 2018-12-02: 12:00:00 25 mL

## 2018-12-02 NOTE — Progress Notes (Signed)
[facility] Progress Note  10 Days Post-Op  Subjective: CC-  Complaining of bilateral foot pain/burning. Unable to have contrast enema yesterday due to retained contrast in colon. Denies abdominal pain. Denies n/v. She reports having multiple loose bowel movements since yesterday.  Objective: Vital signs in last 24 hours: Temp:  [97.7 F (36.5 C)] 97.7 F (36.5 C) (09/11 0634) Pulse Rate:  [68] 68 (09/11 0634) Resp:  [17] 17 (09/11 0634) BP: (127)/(90) 127/90 (09/11 0634) SpO2:  [100 %] 100 % (09/11 0634) Last BM Date: 12/01/18  Intake/Output from previous day: 09/10 0701 - 09/11 0700 In: 923.8 [P.O.:160; I.V.:763.8] Out: 401 [Urine:400; Stool:1] Intake/Output this shift: No intake/output data recorded.  PE: Gen: Alert, NAD, pleasant HEENT: EOM's intact, pupils equal and round Pulm:CTAB, rate andeffort normal Cardio: RRR Abd: Soft, NT/ND, +BS, no HSM Psych: A&Ox3  Skin: no rashes noted, warm and dry GU: defered today and await contrast enema results    Lab Results:  No results for input(s): WBC, HGB, HCT, PLT in the last 72 hours. BMET No results for input(s): NA, K, CL, CO2, GLUCOSE, BUN, CREATININE, CALCIUM in the last 72 hours. PT/INR No results for input(s): LABPROT, INR in the last 72 hours. CMP     Component Value Date/Time   NA 130 (L) 11/24/2018 0232   NA 130 (A) 06/10/2015   K 4.6 11/24/2018 0232   CL 101 11/24/2018 0232   CO2 18 (L) 11/24/2018 0232   GLUCOSE 259 (H) 11/24/2018 0232   BUN 30 (H) 11/24/2018 0232   BUN 14 06/10/2015   CREATININE 1.24 (H) 11/24/2018 0232   CALCIUM 8.0 (L) 11/24/2018 0232   PROT 4.9 (L) 10/14/2018 0539   ALBUMIN 2.0 (L) 10/14/2018 0539   AST 43 (H) 10/14/2018 0539   ALT 32 10/14/2018 0539   ALKPHOS 128 (H) 10/14/2018 0539   BILITOT 1.0 10/14/2018 0539   GFRNONAA 44 (L) 11/24/2018 0232   GFRAA 51 (L) 11/24/2018 0232   Lipase  No results found for: LIPASE     Studies/Results: Dg Abd 1  View  Result Date: 11/30/2018 CLINICAL DATA:  Attempted be to check for rectovaginal fistula, retained contrast from CT EXAM: ABDOMEN - 1 VIEW COMPARISON:  CT 11/28/2018 FINDINGS: Single spot fluoroscopic view of the abdomen. Residual contrast material is present within the colon. Gas pattern is nonobstructed. A rectal catheter is noted. Barium enema subsequently not performed. IMPRESSION: Residual contrast within the colon.  Nonobstructed gas pattern Electronically Signed   By: Donavan Foil M.D.   On: 11/30/2018 13:48   Dg Abd Portable 1v  Result Date: 12/01/2018 CLINICAL DATA:  Evaluate for retained barium EXAM: PORTABLE ABDOMEN - 1 VIEW COMPARISON:  11/30/2018 FINDINGS: Scattered large and small bowel gas is noted. Contrast material remains predominately within the left colon slightly migrated when compared with the prior exam. No free air is seen. No bony abnormality is noted. IMPRESSION: Considerable retained colonic contrast from prior CT. Electronically Signed   By: Inez Catalina M.D.   On: 12/01/2018 08:01    Anti-infectives: Anti-infectives (From admission, onward)   Start     Dose/Rate Route Frequency Ordered Stop   11/23/18 0600  ceFAZolin (ANCEF) IVPB 2g/100 mL premix  Status:  Discontinued     2 g 200 mL/hr over 30 Minutes Intravenous On call to O.R. 11/22/18 1326 11/22/18 1328   11/22/18 2230  ceFAZolin (ANCEF) IVPB 2g/100 mL premix     2 g 200 mL/hr over 30 Minutes Intravenous Every  6 hours 11/22/18 1903 11/23/18 0549   11/22/18 2200  rifaximin (XIFAXAN) tablet 550 mg     550 mg Oral 2 times daily 11/22/18 1903     11/22/18 1330  ceFAZolin (ANCEF) IVPB 2g/100 mL premix     2 g 200 mL/hr over 30 Minutes Intravenous On call to O.R. 11/22/18 1327 11/22/18 1653       Assessment/Plan Complexdisruption to the inferior extensor mechanism below the patella S/p attemptedrepair of the extensor mechanism and retinaculum in the right knee9/3/20 Dr.  Alvan Dame DM Anxiety HLD IBS  Possible rectovaginal fistula -H/o endometrial cancer treated with radiation 2003/2004, hysterectomy 1996 - CT scan and physical exam concerning for possible rectovaginal fistula. - Multiple bowel movements since yesterday. Contrast enema today. Will make further recommendations after this study is complete.  ID -currently rifaximin 9/1>> FEN -IVF, NPO for procedure VTE -SCD, per primary Foley -none Follow up -TBD     LOS: 10 days    Brittany Russo , [facility] Surgery 12/02/2018, 8:20 AM Pager: [phone] Mon-Thurs 7:00 am-4:30 pm Fri 7:00 am -11:30 AM Sat-Sun 7:00 am-11:30 am

## 2018-12-02 NOTE — Progress Notes (Signed)
PT Cancellation Note  Patient Details Name: Brittany Russo MRN: [id number] DOB: Jul 21, 1945   Cancelled Treatment:    Reason Eval/Treat Not Completed: Patient at procedure or test/unavailable - will check back as schedule allows.  Julien Girt, PT Acute Rehabilitation Services Pager [phone]  Office [phone]   Roxine Caddy D Elonda Husky 12/02/2018, 12:52 PM

## 2018-12-02 NOTE — Progress Notes (Signed)
Hypoglycemic Event  CBG: 66 @0744   Treatment: 4oz juice (PA aware)  Symptoms: No symptoms   Follow-up CBG Time:0802 CBG Result:77 Possible Reasons for Event: PT NPO  Comments/notified:Brooke Meuth PA     Darnelle Bos

## 2018-12-02 NOTE — Plan of Care (Signed)
Continue current POC 

## 2018-12-02 NOTE — Progress Notes (Signed)
Patient ID: Brittany Russo, female   DOB: Jun 29, 1945, 73 y.o.   MRN: [id number] Subjective: 10 Days Post-Op Procedure(s) (LRB): repair right knee extensor mechanism (Right)    Patient pretty miserable at this point with all that has transpired.  Waiting for final work up of rectal vaginal fistula concern  Objective:   VITALS:   Vitals:   12/01/18 0557 12/02/18 0634  BP: (!) 133/55 127/90  Pulse: 68 68  Resp: 18 17  Temp: 97.9 F (36.6 C) 97.7 F (36.5 C)  SpO2: 92% 100%    Neurovascular intact Incision: dressing C/D/I  LABS No results for input(s): HGB, HCT, WBC, PLT in the last 72 hours.  No results for input(s): NA, K, BUN, CREATININE, GLUCOSE in the last 72 hours.  No results for input(s): LABPT, INR in the last 72 hours.   Assessment/Plan: 10 Days Post-Op Procedure(s) (LRB): repair right knee extensor mechanism (Right)   Up with therapy if able Pending GSU work up and recommendations  WBAT RLE with knee locked in extension at all times - no knee flexion UE strengthening to support ambulation and transfers  Discharge to SNF when ready once GSU work up complete

## 2018-12-02 NOTE — TOC Progression Note (Addendum)
Transition of Care [facility]) - Progression Note    Patient Details  Name: Brittany Russo MRN: [id number] Date of Birth: [birth year]  Transition of Care [facility]) CM/SW [place], [place] Phone Number: 12/02/2018, 3:17 PM  Clinical Narrative:    Patient agreeable to return to [facility] at discharge.  TOC will continue to follow this patient.     Barriers to Discharge: Continued medical work up  Expected Discharge Plan and Services   In-house Referral: Clinical Social Work   Post Acute Care Choice: [place] Living arrangements for the past 2 months: [place] Expected Discharge Date: 11/24/18               DME Arranged: N/A DME Agency: NA       HH Arranged: NA HH Agency: NA         Social Determinants of Health (SDOH) Interventions    Readmission Risk Interventions No flowsheet data found.

## 2018-12-02 NOTE — Progress Notes (Signed)
No evidence of a rectovaginal fistula on CT pelvis with contrast done today.  No surgical intervention indicated at this time.  We will sign off.  Please page with any further needs for your patient.  Jackson Latino, [facility] Pager [phone]

## 2018-12-03 LAB — GLUCOSE, CAPILLARY
Glucose-Capillary: 169 mg/dL — ABNORMAL HIGH (ref 70–99)
Glucose-Capillary: 203 mg/dL — ABNORMAL HIGH (ref 70–99)
Glucose-Capillary: 203 mg/dL — ABNORMAL HIGH (ref 70–99)
Glucose-Capillary: 195 mg/dL — ABNORMAL HIGH (ref 70–99)

## 2018-12-03 NOTE — Progress Notes (Signed)
Brittany Russo  MRN: [id number] DOB/Age: [birth year] 71 y.o. [facility] Procedure: Procedure(s) (LRB): repair right knee extensor mechanism (Right)     Subjective: In bed with no specific complaints General Surgery signed off after CT showed no evidence of fistula.  Vital Signs Temp:  [97.6 F (36.4 C)-98 F (36.7 C)] 97.6 F (36.4 C) (09/12 0550) Pulse Rate:  [56-70] 65 (09/12 0900) Resp:  [15-20] 20 (09/12 0550) BP: (121-143)/(42-68) 130/62 (09/12 0550) SpO2:  [100 %] 100 % (09/12 0550)  Lab Results No results for input(s): WBC, HGB, HCT, PLT in the last 72 hours. BMET No results for input(s): NA, K, CL, CO2, GLUCOSE, BUN, CREATININE, CALCIUM in the last 72 hours. INR  Date Value Ref Range Status  10/14/2018 1.5 (H) 0.8 - 1.2 Final    Comment:    (NOTE) INR goal varies based on device and disease states. Performed at [facility], [place] [address] [place], [place] [address]      Exam TROM in place Nurse was in room and we identified some ares that need additional padding to prevent skin break down, specifically around the medial malleolus.        Plan Plan will be for skilled placement most likely at first of week  Jenetta Loges PA-C  12/03/2018, 9:46 AM Contact # [phone]

## 2018-12-03 NOTE — Plan of Care (Signed)
  Problem: Clinical Measurements: Goal: Respiratory complications will improve Outcome: Progressing   Problem: Clinical Measurements: Goal: Cardiovascular complication will be avoided Outcome: Progressing   Problem: Activity: Goal: Risk for activity intolerance will decrease Outcome: Progressing   Problem: Coping: Goal: Level of anxiety will decrease Outcome: Progressing   Problem: Elimination: Goal: Will not experience complications related to bowel motility Outcome: Progressing   

## 2018-12-03 NOTE — Progress Notes (Signed)
Physical Therapy Treatment Patient Details Name: Brittany Russo MRN: [id number] DOB: 1946-01-17 Today's Date: 12/03/2018    History of Present Illness Pt is a 73 y.o. female with admissions 10/10/18 for lower GI bleed and 09/22/18 for R TKA with subsequent fall resulting in readmission with R distal femur fx s/p TKA repair; also with PMH includes HTN, DM2, obesity.  Pt admitted for repair right knee extensor mechanism on 11/22/18. 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    PT Comments    Patient received in bed, very pleasant and willing to work with therapy today. Ensured Bledsoe brace was in extension and locked, then performed supine to sit with ModAx2; continues to demonstrate posterior unsteadiness at EOB requiring Min guard for safety.  Performed multiple sit to stands with heavy maxAx2 for this transfer, and strong posterior lean noted as well requiring heavy facilitation for upright posture. Once upright and with posterior lean corrected, required Min-ModAx2 for balance and safety but easily fatigued in standing. Again felt poor standing balance and high fatigue levels limited safe transfers today. Continued with PWB 50% precaution until MD clarifies WB status. She was returned to bed and positioned to comfort with all needs met, bed alarm active. Once back in bed, noted that patient spontaneously moved knee into slightly flexed position and PT quickly corrected this, doubled checked brace settings which remained locked in extension- asked RN to have ortho tech come and make sure brace lock is engaging correctly.     Follow Up Recommendations  SNF;Supervision/Assistance - 24 hour     Equipment Recommendations  None recommended by PT    Recommendations for Other Services       Precautions / Restrictions Precautions Precautions: Fall;Knee Precaution Comments: No ROM to R knee Required Braces or Orthoses: Other Brace Knee  Immobilizer - Right: On at all times Other Brace: bledsoe brace locked in extension Restrictions Weight Bearing Restrictions: Yes RLE Weight Bearing: Partial weight bearing RLE Partial Weight Bearing Percentage or Pounds: 50% Other Position/Activity Restrictions: per order set 50% PWB however Dr. Aurea Graff note on 12/02/18 states WBAT- will need to clarify with ortho    Mobility  Bed Mobility Overal bed mobility: Needs Assistance Bed Mobility: Supine to Sit;Sit to Supine     Supine to sit: +2 for physical assistance;Mod assist Sit to supine: +2 for physical assistance;Mod assist   General bed mobility comments: MaxAx2 for bringing legs around and trunk to upright, MaxA to scoot to EOB  Transfers Overall transfer level: Needs assistance Equipment used: Rolling walker (2 wheeled) Transfers: Sit to/from Stand Sit to Stand: Max assist;+2 physical assistance         General transfer comment: heavy MaxAx2 for multiple sit to stands, noted strong posterior lean requiring Max cues and Mod facilitation to correct. Min-ModAx2 for balance once more upright. Very easily fatigued HR and SpO2 stable on room air  Ambulation/Gait             General Gait Details: unable   Stairs             Wheelchair Mobility    Modified Rankin (Stroke Patients Only)       Balance Overall balance assessment: Needs assistance Sitting-balance support: Feet supported;Bilateral upper extremity supported Sitting balance-Leahy Scale: Fair   Postural control: Posterior lean Standing balance support: Bilateral upper extremity supported Standing balance-Leahy Scale: Poor Standing balance comment: heavily reliant on UEs throughout session for stability, strong posterior lean  Cognition Arousal/Alertness: Awake/alert Behavior During Therapy: Flat affect Overall Cognitive Status: Within Functional Limits for tasks assessed Area of Impairment:  Memory;Following commands;Problem solving                     Memory: Decreased short-term memory;Decreased recall of precautions Following Commands: Follows one step commands with increased time;Follows multi-step commands inconsistently     Problem Solving: Requires verbal cues;Difficulty sequencing        Exercises      General Comments        Pertinent Vitals/Pain Pain Assessment: No/denies pain Pain Score: 0-No pain Faces Pain Scale: No hurt    Home Living                      Prior Function            PT Goals (current goals can now be found in the care plan section) Acute Rehab PT Goals Patient Stated Goal: be able to walk PT Goal Formulation: With patient Time For Goal Achievement: 12/07/18 Potential to Achieve Goals: Good Progress towards PT goals: Not progressing toward goals - comment(limited by medical status/multiple procedures preventing PT from getting to her regularly)    Frequency    Min 3X/week      PT Plan Current plan remains appropriate    Co-evaluation              AM-PAC PT "6 Clicks" Mobility   Outcome Measure  Help needed turning from your back to your side while in a flat bed without using bedrails?: A Lot Help needed moving from lying on your back to sitting on the side of a flat bed without using bedrails?: A Lot Help needed moving to and from a bed to a chair (including a wheelchair)?: Total Help needed standing up from a chair using your arms (e.g., wheelchair or bedside chair)?: Total Help needed to walk in hospital room?: Total Help needed climbing 3-5 steps with a railing? : Total 6 Click Score: 8    End of Session Equipment Utilized During Treatment: Gait belt;Other (comment)(bledsoe brace locked in extension) Activity Tolerance: Patient limited by fatigue Patient left: with call bell/phone within reach;with bed alarm set;in bed Nurse Communication: Mobility status;Other (comment);Weight bearing  status(requesting ortho tech come double check lock on Bledsoe) PT Visit Diagnosis: Muscle weakness (generalized) (M62.81);Other abnormalities of gait and mobility (R26.89);Pain Pain - Right/Left: Right Pain - part of body: Knee     Time: 1228-1251 PT Time Calculation (min) (ACUTE ONLY): 23 min  Charges:  $Therapeutic Activity: 23-37 mins                     Deniece Ree PT, DPT, CBIS  Supplemental Physical Therapist [place]    Pager [phone] Acute Rehab Office [phone]

## 2018-12-03 NOTE — Plan of Care (Signed)

## 2018-12-03 NOTE — Progress Notes (Signed)
Pt daughter called Therapist, sports. Rn updated family on pt current condition and plan of care. Pt daughter Brittany Russo is concerned that her momma is not quite ready. Rn assured her the plan was for d/c on Monday to SNF. RN also assured family that she would still get physical therapy as well. Brittany Russo would like a call from Dr. Alvan Dame or his provider prior to pt d/c to ensure her mom is ready. She is concenred that she will be sent home too early. Pt remains stable. RN will continue to monitor.

## 2018-12-04 LAB — CBC
HCT: 32.2 % — ABNORMAL LOW (ref 36.0–46.0)
Hemoglobin: 10.5 g/dL — ABNORMAL LOW (ref 12.0–15.0)
MCH: 30.3 pg (ref 26.0–34.0)
MCHC: 32.6 g/dL (ref 30.0–36.0)
MCV: 92.8 fL (ref 80.0–100.0)
Platelets: 78 10*3/uL — ABNORMAL LOW (ref 150–400)
RBC: 3.47 MIL/uL — ABNORMAL LOW (ref 3.87–5.11)
RDW: 16 % — ABNORMAL HIGH (ref 11.5–15.5)
WBC: 5.1 10*3/uL (ref 4.0–10.5)
nRBC: 0 % (ref 0.0–0.2)

## 2018-12-04 LAB — GLUCOSE, CAPILLARY
Glucose-Capillary: 184 mg/dL — ABNORMAL HIGH (ref 70–99)
Glucose-Capillary: 212 mg/dL — ABNORMAL HIGH (ref 70–99)
Glucose-Capillary: 155 mg/dL — ABNORMAL HIGH (ref 70–99)
Glucose-Capillary: 131 mg/dL — ABNORMAL HIGH (ref 70–99)

## 2018-12-04 LAB — COMPREHENSIVE METABOLIC PANEL
ALT: 22 U/L (ref 0–44)
AST: 35 U/L (ref 15–41)
Albumin: 2.8 g/dL — ABNORMAL LOW (ref 3.5–5.0)
Alkaline Phosphatase: 121 U/L (ref 38–126)
Anion gap: 10 (ref 5–15)
BUN: 6 mg/dL — ABNORMAL LOW (ref 8–23)
CO2: 22 mmol/L (ref 22–32)
Calcium: 9 mg/dL (ref 8.9–10.3)
Chloride: 96 mmol/L — ABNORMAL LOW (ref 98–111)
Creatinine, Ser: 0.79 mg/dL (ref 0.44–1.00)
GFR calc Af Amer: >60 mL/min (ref >60)
GFR calc non Af Amer: >60 mL/min (ref >60)
Glucose, Bld: 185 mg/dL — ABNORMAL HIGH (ref 70–99)
Potassium: 5 mmol/L (ref 3.5–5.1)
Sodium: 128 mmol/L — ABNORMAL LOW (ref 135–145)
Total Bilirubin: 1.1 mg/dL (ref 0.3–1.2)
Total Protein: 5.8 g/dL — ABNORMAL LOW (ref 6.5–8.1)

## 2018-12-04 LAB — TRANSFERRIN: Transferrin: 234 mg/dL (ref 192–382)

## 2018-12-04 LAB — PREALBUMIN: Prealbumin: 9.2 mg/dL — ABNORMAL LOW (ref 18–38)

## 2018-12-04 MED ORDER — ENSURE ENLIVE PO LIQD: 237.0000 mL | Freq: Two times a day (BID) | ORAL | Status: DC

## 2018-12-04 NOTE — Plan of Care (Signed)
°  Problem: Clinical Measurements: °Goal: Respiratory complications will improve °Outcome: Progressing °  °Problem: Clinical Measurements: °Goal: Cardiovascular complication will be avoided °Outcome: Progressing °  °Problem: Activity: °Goal: Risk for activity intolerance will decrease °Outcome: Progressing °  °Problem: Pain Managment: °Goal: General experience of comfort will improve °Outcome: Progressing °  °

## 2018-12-04 NOTE — Progress Notes (Signed)
Patient ID: Brittany Russo, female   DOB: 1946/01/03, 73 y.o.   MRN: [id number]   Labs ordered today to evaluate her nutritional status Indications of severe nutritional depletion, low ALBUMIN, PREALBUMIN, total protein  Have place dietician consult and will follow up with phone call in am.  Need recommendations for discharge   D/C plans for Tuesday to SNF

## 2018-12-04 NOTE — Progress Notes (Signed)
Patient ID: Brittany Russo, female   DOB: 05-26-1945, 73 y.o.   MRN: [id number] Subjective: 12 Days Post-Op Procedure(s) (LRB): repair right knee extensor mechanism (Right)    Patient reports pain as mild.  Objective:   VITALS:   Vitals:   12/03/18 2136 12/04/18 0456  BP: (!) 117/54 (!) 130/56  Pulse: (!) 57 (!) 59  Resp: 17 16  Temp: 97.9 F (36.6 C) 97.9 F (36.6 C)  SpO2: 100% 100%    Neurovascular intact Incision: dressing C/D/I  LABS No results for input(s): HGB, HCT, WBC, PLT in the last 72 hours.  No results for input(s): NA, K, BUN, CREATININE, GLUCOSE in the last 72 hours.  No results for input(s): LABPT, INR in the last 72 hours.   Assessment/Plan: 12 Days Post-Op Procedure(s) (LRB): repair right knee extensor mechanism (Right)   Plan: Reviewed GSu consult - I appreciate them helping to rule out a fistula and further surgery for her. Diet resumed I am going to check her nutritional status prior to discharge Reviewed importance of mental and physical strengthening over the next several months - will reiterate to her family RTC in 1-2 weeks Work towards D/C to SNF tomorrow or Tuesday Add Ensure Shakes to meals BID

## 2018-12-05 LAB — GLUCOSE, CAPILLARY
Glucose-Capillary: 81 mg/dL (ref 70–99)
Glucose-Capillary: 175 mg/dL — ABNORMAL HIGH (ref 70–99)
Glucose-Capillary: 147 mg/dL — ABNORMAL HIGH (ref 70–99)
Glucose-Capillary: 201 mg/dL — ABNORMAL HIGH (ref 70–99)

## 2018-12-05 LAB — AMMONIA: Ammonia: 49 umol/L — ABNORMAL HIGH (ref 9–35)

## 2018-12-05 MED ORDER — DOCUSATE SODIUM 100 MG PO CAPS: 100.0000 mg | ORAL_CAPSULE | Freq: Two times a day (BID) | ORAL | 0 refills | Status: DC

## 2018-12-05 MED ORDER — HYDROCODONE-ACETAMINOPHEN 5-325 MG PO TABS: 1.0000 | ORAL_TABLET | Freq: Four times a day (QID) | ORAL | 0 refills | Status: DC | PRN

## 2018-12-05 MED ORDER — POLYETHYLENE GLYCOL 3350 17 G PO PACK: 17.0000 g | PACK | Freq: Two times a day (BID) | ORAL | 0 refills | Status: DC

## 2018-12-05 MED ORDER — ALPRAZOLAM 0.25 MG PO TABS: 0.2500 mg | ORAL_TABLET | Freq: Two times a day (BID) | ORAL | 0 refills | Status: DC

## 2018-12-05 MED ORDER — ADULT MULTIVITAMIN W/MINERALS CH
1.0000 | ORAL_TABLET | Freq: Every day | ORAL | Status: DC
  Administered 2018-12-06 – 2018-12-07 (×2): 1 via ORAL
  Filled 2018-12-05 (×2): qty 1

## 2018-12-05 MED ORDER — FERROUS SULFATE 325 (65 FE) MG PO TABS: 325.0000 mg | ORAL_TABLET | Freq: Three times a day (TID) | ORAL | 0 refills | Status: DC

## 2018-12-05 MED ORDER — BOOST / RESOURCE BREEZE PO LIQD CUSTOM
1.0000 | Freq: Two times a day (BID) | ORAL | Status: DC
  Administered 2018-12-05 – 2018-12-06 (×2): 1 via ORAL

## 2018-12-05 MED ORDER — METHOCARBAMOL 500 MG PO TABS: 500.0000 mg | ORAL_TABLET | Freq: Four times a day (QID) | ORAL | 0 refills | Status: DC | PRN

## 2018-12-05 NOTE — NC FL2 (Addendum)
[place] LEVEL OF CARE SCREENING TOOL     IDENTIFICATION  Patient Name: Brittany Russo Birthdate: 11-28-1945 Sex: female Admission Date (Current Location): 11/22/2018  [facility] and [place] Number:  Herbalist and Address:  [facility],  [address] [place], [place]      Provider Number: [id number]  Attending Physician Name and Address:  Paralee Cancel, MD  Relative Name and Phone Number:    JOSSELYNE, LIECHTY  [phone], [phone]      Current Level of Care: Hospital Recommended Level of Care: [place] Prior Approval Number:    Date Approved/Denied:   PASRR Number:    [id number]  Discharge Plan: SNF    Current Diagnoses: Patient Active Problem List   Diagnosis Date Noted  . Hematuria 11/23/2018  . Overweight (BMI 25.0-29.9) 11/23/2018  . Mechanical problem with extremity 11/22/2018  . Cirrhosis of liver with ascites ([place]) 11/09/2018  . Nausea without vomiting 10/26/2018  . Ascites   . Rectal bleeding   . AKI (acute kidney injury) ([place]) 10/11/2018  . Lower GI bleed 10/10/2018  . Anasarca 10/10/2018  . Hypoalbuminemia 10/10/2018  . Depression 09/28/2018  . Acute hepatic encephalopathy 09/28/2018  . Cirrhosis ([place]) 09/28/2018  . Periprosthetic fracture around internal prosthetic right knee joint 09/24/2018  . S/P right TKA 09/22/2018  . Status post total knee replacement, right 09/22/2018  . DM2 (diabetes mellitus, type 2) ([place]) 07/11/2015  . Hypertension 07/11/2015  . Hyperlipidemia 07/11/2015    Orientation RESPIRATION BLADDER Height & Weight     Self, Time, Place, Situation  Normal Continent Weight: 161 lb 1 oz (73.1 kg) Height:  5\' 2"  (157.5 cm)  BEHAVIORAL SYMPTOMS/MOOD NEUROLOGICAL BOWEL NUTRITION STATUS      Continent (Carb Modified)  AMBULATORY STATUS COMMUNICATION OF NEEDS Skin   Extensive Assist Verbally Surgical wounds                       Personal Care Assistance Level of  Assistance  Bathing, Feeding, Dressing Bathing Assistance: Maximum assistance Feeding assistance: Independent Dressing Assistance: Maximum assistance     Functional Limitations Info  Sight, Hearing, Speech Sight Info: Adequate Hearing Info: Adequate Speech Info: Adequate    SPECIAL CARE FACTORS FREQUENCY  PT (By licensed PT), OT (By licensed OT)     PT Frequency: 5x/week OT Frequency: 5x/week            Contractures Contractures Info: Not present    Additional Factors Info  Code Status, Allergies, Psychotropic Code Status Info: Fullcode Allergies Info: Asa Aspirin, Other Psychotropic Info: Xanax, Celexa         Current Medications (12/05/2018):  This is the current hospital active medication list Current Facility-Administered Medications  Medication Dose Route Frequency Provider Last Rate Last Dose  . 0.9 %  sodium chloride infusion   Intravenous Continuous Danae Orleans, PA-C   Stopped at 11/24/18 1919  . acetaminophen (TYLENOL) tablet 325-650 mg  325-650 mg Oral Q6H PRN Danae Orleans, PA-C   650 mg at 12/04/18 2350  . ALPRAZolam Duanne Moron) tablet 0.25 mg  0.25 mg Oral BID Danae Orleans, PA-C   0.25 mg at 12/05/18 1016  . alum & mag hydroxide-simeth (MAALOX/MYLANTA) 200-200-20 MG/5ML suspension 15 mL  15 mL Oral Q4H PRN Babish, Matthew, PA-C      . atenolol (TENORMIN) tablet 50 mg  50 mg Oral Daily Danae Orleans, PA-C   50 mg at 12/05/18 1017  . atorvastatin (LIPITOR) tablet 80  mg  80 mg Oral Q2000 Danae Orleans, PA-C   80 mg at 12/04/18 2016  . bisacodyl (DULCOLAX) suppository 10 mg  10 mg Rectal Daily PRN Babish, Matthew, PA-C      . buPROPion (WELLBUTRIN XL) 24 hr tablet 150 mg  150 mg Oral Daily Danae Orleans, PA-C   150 mg at 12/05/18 1017  . citalopram (CELEXA) tablet 40 mg  40 mg Oral Daily Danae Orleans, PA-C   40 mg at 12/05/18 1016  . dextrose 5 % solution   Intravenous Continuous Meuth, Brooke A, PA-C 20 mL/hr at 12/05/18 0600    . diltiazem  (CARDIZEM CD) 24 hr capsule 180 mg  180 mg Oral Daily Danae Orleans, PA-C   180 mg at 12/05/18 1016  . diphenhydrAMINE (BENADRYL) 12.5 MG/5ML elixir 12.5-25 mg  12.5-25 mg Oral Q4H PRN Danae Orleans, PA-C      . docusate sodium (COLACE) capsule 100 mg  100 mg Oral BID Babish, Matthew, PA-C   100 mg at 12/05/18 1017  . feeding supplement (BOOST / RESOURCE BREEZE) liquid 1 Container  1 Container Oral BID BM Paralee Cancel, MD      . ferrous sulfate tablet 325 mg  325 mg Oral BID WC Danae Orleans, PA-C   325 mg at 12/05/18 [id number]  . fluticasone (FLONASE) 50 MCG/ACT nasal spray 1 spray  1 spray Each Nare Daily Danae Orleans, PA-C   1 spray at 12/05/18 1018  . furosemide (LASIX) tablet 40 mg  40 mg Oral Daily Danae Orleans, PA-C   40 mg at 12/05/18 1017  . glipiZIDE (GLUCOTROL) tablet 5 mg  5 mg Oral BID AC Babish, Rodman [place], PA-C   5 mg at 12/05/18 [id number]  . HYDROcodone-acetaminophen (NORCO) 7.5-325 MG per tablet 1-2 tablet  1-2 tablet Oral Q4H PRN Danae Orleans, PA-C   1 tablet at 12/04/18 2016  . HYDROcodone-acetaminophen (NORCO/VICODIN) 5-325 MG per tablet 1-2 tablet  1-2 tablet Oral Q4H PRN Danae Orleans, PA-C   1 tablet at 11/26/18 2157  . HYDROmorphone (DILAUDID) injection 0.5-1 mg  0.5-1 mg Intravenous Q2H PRN Danae Orleans, PA-C   1 mg at 12/02/18 [id number]  . insulin aspart (novoLOG) injection 0-15 Units  0-15 Units Subcutaneous TID WC Danae Orleans, PA-C   3 Units at 12/05/18 1155  . insulin glargine (LANTUS) injection 65 Units  65 Units Subcutaneous Daily Danae Orleans, PA-C   65 Units at 12/05/18 1016  . linagliptin (TRADJENTA) tablet 5 mg  5 mg Oral Q breakfast Paralee Cancel, MD   5 mg at 12/05/18 [id number]  . loratadine (CLARITIN) tablet 10 mg  10 mg Oral Daily Danae Orleans, PA-C   10 mg at 12/05/18 1018  . magnesium citrate solution 1 Bottle  1 Bottle Oral Once PRN Babish, Matthew, PA-C      . menthol-cetylpyridinium (CEPACOL) lozenge 3 mg  1 lozenge Oral PRN Danae Orleans, PA-C        Or  . phenol (CHLORASEPTIC) mouth spray 1 spray  1 spray Mouth/Throat PRN Danae Orleans, PA-C      . metFORMIN (GLUCOPHAGE) tablet 500 mg  500 mg Oral BID WC Paralee Cancel, MD   500 mg at 12/05/18 [id number]  . methocarbamol (ROBAXIN) tablet 500 mg  500 mg Oral Q6H PRN Danae Orleans, PA-C   500 mg at 12/04/18 2016   Or  . methocarbamol (ROBAXIN) 500 mg in dextrose 5 % 50 mL IVPB  500 mg Intravenous Q6H PRN Danae Orleans, PA-C 100 mL/hr at 12/02/18 1620 500  mg at 12/02/18 1620  . metoCLOPramide (REGLAN) tablet 5-10 mg  5-10 mg Oral Q8H PRN Danae Orleans, PA-C       Or  . metoCLOPramide (REGLAN) injection 5-10 mg  5-10 mg Intravenous Q8H PRN Babish, Matthew, PA-C      . montelukast (SINGULAIR) tablet 10 mg  10 mg Oral QHS Babish, Matthew, PA-C   10 mg at 12/04/18 2211  . multivitamin with minerals tablet 1 tablet  1 tablet Oral Daily Paralee Cancel, MD      . ondansetron [facility]) tablet 4 mg  4 mg Oral Q6H PRN Danae Orleans, PA-C       Or  . ondansetron [facility]) injection 4 mg  4 mg Intravenous Q6H PRN Danae Orleans, PA-C   4 mg at 12/05/18 0943  . polyethylene glycol (MIRALAX / GLYCOLAX) packet 17 g  17 g Oral BID Danae Orleans, PA-C   17 g at 11/25/18 2106  . potassium chloride (K-DUR) CR tablet 10 mEq  10 mEq Oral Daily Danae Orleans, PA-C   10 mEq at 12/05/18 1017  . rifaximin (XIFAXAN) tablet 550 mg  550 mg Oral BID Danae Orleans, PA-C   550 mg at 12/05/18 1016  . spironolactone (ALDACTONE) tablet 50 mg  50 mg Oral Daily Danae Orleans, PA-C   50 mg at 12/05/18 1016     Discharge Medications: Please see discharge summary for a list of discharge medications.  Relevant Imaging Results:  Relevant Lab Results:   Additional Information SSN: [id number]  Lia Hopping, LCSW

## 2018-12-05 NOTE — Progress Notes (Signed)
     Subjective: 13 Days Post-Op Procedure(s) (LRB): repair right knee extensor mechanism (Right)   Patient reports pain as mild, pain controlled.  Nop reported events throughout the night. We have discussed her overall nutritional status and the need to consult a nutritionist.  I have mentioned to her that upon the recommendations from the nutritionist we will most likely get her d/c to SNF tomorrow.  Need her her nutritional status to improve to give her the best opportunity to heal and progress with her current situation. Again plan on d/c to SNF tomorrow.     Objective:   VITALS:   Vitals:   12/04/18 2129 12/05/18 0536  BP: (!) 130/58 136/62  Pulse: (!) 58 63  Resp: 18 16  Temp: 97.6 F (36.4 C) 97.9 F (36.6 C)  SpO2: 100% 100%   T-ROM brace locked in extension Dorsiflexion/Plantar flexion intact Incision: dressing C/D/I No cellulitis present Compartment soft  LABS Recent Labs    12/04/18 0731  HGB 10.5*  HCT 32.2*  WBC 5.1  PLT 78*    Recent Labs    12/04/18 0731  NA 128*  K 5.0  BUN 6*  CREATININE 0.79  GLUCOSE 185*     Assessment/Plan: 13 Days Post-Op Procedure(s) (LRB): repair right knee extensor mechanism (Right) Consult nutritionalist Up with therapy Discharge to SNF probably tomorrow.   [place] Pugh Abigial [place]   PAC  12/05/2018, 12:12 PM

## 2018-12-05 NOTE — Progress Notes (Signed)
Initial Nutrition Assessment  RD working remotely.   DOCUMENTATION CODES:   (unable to assess for malnutrition at this time.)  INTERVENTION:  - will d/c Ensure Enlive. - will order Boost Breeze BID, each supplement provides 250 kcal and 9 grams of protein. - will order Magic Cup with dinner meals, each supplement provides 290 kcal and 9 grams of protein. - will order daily multivitamin with minerals. - continue to encourage PO intakes.    NUTRITION DIAGNOSIS:   Increased nutrient needs related to post-op healing, acute illness as evidenced by estimated needs.  GOAL:   Patient will meet greater than or equal to 90% of their needs  MONITOR:   PO intake, Supplement acceptance, Labs, Weight trends  REASON FOR ASSESSMENT:   Consult Assessment of nutrition requirement/status  ASSESSMENT:   73 y.o. female who underwent R TKA in July and presented for planned surgery for repair of R knee mechanism on 9/1.  Patient out of the room to CT at this time. Per flow sheet, she consumed 75% of breakfast, 80% of lunch, and 100% of dinner on 9/12 (total of 1588 kcal, 71 grams protein); 50% of breakfast and 75% of lunch and dinner on 9/13 (total of 1237 kcal, 63 grams protein). Ensure Enlive was ordered BID yesterday and patient has refused all 3 bottles of supplement offered to her.   Per chart review, current weight is 161 lb and weight on 8/5 was 177 lb. This indicates 16 lb weight loss (9% body weight) in the past 5 weeks; noted lasix and aldactone ordered.   Albumin has a half-life of 21 days and is strongly affected by stress response and inflammatory process, therefore, do not expect to see an improvement in this lab value during acute hospitalization.  Per notes: - plan for d/c to SNF on Tuesday (9/15)   Labs reviewed; CBGs: 81 and 175 mg/dl, Na: 128 mmol/l, Cl: 96 mmol/l, BUN: 6 mg/dl. Medications reviewed; 100 mg colace BID, 325 mg ferrous sulfate BID, 40 mg oral lasix/day, 5 mg  glucotrol BID, sliding scale novolog, 65 units lantus/day, 5 mg tradjenta/day, 500 mg glucophage BID, 1 packet miralax BID, 10 mEq K-Dur/day, 50 mg aldactone/day.  IVF; NS @ 75 ml/hr.     NUTRITION - FOCUSED PHYSICAL EXAM:  unable to complete at this time.   Diet Order:   Diet Order            Diet Carb Modified Fluid consistency: Thin; Room service appropriate? Yes  Diet effective now              EDUCATION NEEDS:   No education needs have been identified at this time  Skin:  Skin Assessment: Skin Integrity Issues: Skin Integrity Issues:: Incisions Incisions: 9/1  Last BM:  9/12  Height:   Ht Readings from Last 1 Encounters:  11/22/18 5\' 2"  (1.575 m)    Weight:   Wt Readings from Last 1 Encounters:  11/22/18 73.1 kg    Ideal Body Weight:  50 kg  BMI:  Body mass index is 29.46 kg/m.  Estimated Nutritional Needs:   Kcal:  1800-2000 kcal  Protein:  85-95 grams  Fluid:  >/= 2 L/day      Jarome Matin, MS, RD, LDN, [facility] Inpatient Clinical Dietitian Pager # [phone] After hours/weekend pager # [phone]

## 2018-12-05 NOTE — Progress Notes (Addendum)
Physical Therapy Treatment Patient Details Name: KHILEE KASIM MRN: [id number] DOB: 1946-02-08 Today's Date: 12/05/2018    History of Present Illness Pt is a 73 y.o. female with admissions 10/10/18 for lower GI bleed and 09/22/18 for R TKA with subsequent fall resulting in readmission with R distal femur fx s/p TKA repair; also with PMH includes HTN, DM2, obesity.  Pt admitted for repair right knee extensor mechanism on 11/22/18. 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    PT Comments    POD # 12 Pt in bed.  Readjusted Bledsoe brace to correct position (slid down leg) and performed skin check.  Brace is lock at 0 degree extension.  Assisted OOB to recliner.  General bed mobility comments: MaxAx2 for bringing legs around and trunk to upright, MaxA to scoot to EOB while supporting R LE.  General transfer comment: Pt reports Neuropathy B LE and required 50% VC's on proper hand placement(push from bed), L LE placement (foot under), + 2 side by side assist from elevated bed with partial stance.  Pt was unable to weight shift, unable to side step and unable to complete 1/4 pivot turn to recliner.  Required Total Assist to complete turn and required Total Assist to control decend to recliner. Performed some TE's and applied ICE.  Positioned to comfort.  Chamizal pad under pt for nursing staff to use lift to assist back to bed. Pt plans to return to SNF to complete her Rehab.  Follow Up Recommendations  SNF;Supervision/Assistance - 24 hour     Equipment Recommendations  None recommended by PT    Recommendations for Other Services       Precautions / Restrictions Precautions Precautions: Fall;Knee Precaution Comments: No ROM to R knee Required Braces or Orthoses: Other Brace Knee Immobilizer - Right: On at all times Other Brace: bledsoe brace locked in full extension Restrictions Weight Bearing Restrictions: Yes RLE Weight  Bearing: Partial weight bearing RLE Partial Weight Bearing Percentage or Pounds: 50 Other Position/Activity Restrictions: PWB order placed 11-22-18 then per Dr Alvan Dame note 12-02-18 WBAT but pt stated she was still PWB (will assume PWB)    Mobility  Bed Mobility Overal bed mobility: Needs Assistance Bed Mobility: Supine to Sit     Supine to sit: +2 for physical assistance;Mod assist;Max assist     General bed mobility comments: MaxAx2 for bringing legs around and trunk to upright, MaxA to scoot to EOB while supporting R LE  Transfers Overall transfer level: Needs assistance Equipment used: Rolling walker (2 wheeled) Transfers: Sit to/from [employer] Transfers Sit to Stand: Max assist;+2 physical assistance;From elevated surface;+2 safety/equipment Stand pivot transfers: Max assist;Total assist;+2 physical assistance;+2 safety/equipment;From elevated surface       General transfer comment: Pt reports Neuropathy B LE and required 50% VC's on proper hand placement(push from bed), L LE placement (foot under), + 2 side by side assist from elevated bed with partial stance.  Pt was unable to weight shift, unable to side step and unable to complete 1/4 pivot turn to recliner.  Required Total Assist to complete turn and required Total Assist to control decend to recliner.  Ambulation/Gait             General Gait Details: unable to attempt due to transfer level   Stairs             Wheelchair Mobility    Modified Rankin (Stroke Patients Only)       Balance  Cognition Arousal/Alertness: Awake/alert Behavior During Therapy: WFL for tasks assessed/performed Overall Cognitive Status: Within Functional Limits for tasks assessed                                 General Comments: pt stated she easily gets anxious and shared her concern with "having to start all over" when we spoke of Rehab.  "I  wish I could go home" instead of Rehab      Exercises Total Joint Exercises Ankle Circles/Pumps: AROM;Both;20 reps;Seated;AAROM Quad Sets: AROM;Both;Seated;10 reps Hip ABduction/ADduction: AAROM;Right;10 reps Straight Leg Raises: AAROM;Right;5 reps    General Comments        Pertinent Vitals/Pain Pain Assessment: Faces Faces Pain Scale: Hurts a little bit Pain Location: R knee Pain Descriptors / Indicators: Aching;Sore Pain Intervention(s): Monitored during session;Repositioned;Ice applied    Home Living                      Prior Function            PT Goals (current goals can now be found in the care plan section) Progress towards PT goals: Progressing toward goals    Frequency    Min 3X/week      PT Plan Current plan remains appropriate    Co-evaluation              AM-PAC PT "6 Clicks" Mobility   Outcome Measure  Help needed turning from your back to your side while in a flat bed without using bedrails?: A Lot Help needed moving from lying on your back to sitting on the side of a flat bed without using bedrails?: A Lot Help needed moving to and from a bed to a chair (including a wheelchair)?: Total Help needed standing up from a chair using your arms (e.g., wheelchair or bedside chair)?: Total Help needed to walk in hospital room?: Total Help needed climbing 3-5 steps with a railing? : Total 6 Click Score: 8    End of Session Equipment Utilized During Treatment: Gait belt Activity Tolerance: Patient limited by fatigue Patient left: in chair;with call bell/phone within reach Nurse Communication: Need for lift equipment PT Visit Diagnosis: Muscle weakness (generalized) (M62.81);Other abnormalities of gait and mobility (R26.89);Pain Pain - Right/Left: Right Pain - part of body: Knee     Time: 1353-1418 PT Time Calculation (min) (ACUTE ONLY): 25 min  Charges:  $Therapeutic Exercise: 8-22 mins $Therapeutic Activity: 8-22 mins                      Rica Koyanagi  PTA Acute  Rehabilitation Services Pager      [phone] Office      [phone]

## 2018-12-05 NOTE — Progress Notes (Signed)
OT Cancellation Note  Patient Details Name: Brittany Russo MRN: [id number] DOB: Sep 19, 1945   Cancelled Treatment:    Reason Eval/Treat Not Completed: Other (comment)  Noted plan for SNF- will defer OT eval to SNF  Kari Baars, [place] [phone] Office- [phone], Edwena Felty D 12/05/2018, 1:01 PM

## 2018-12-06 LAB — GLUCOSE, CAPILLARY
Glucose-Capillary: 137 mg/dL — ABNORMAL HIGH (ref 70–99)
Glucose-Capillary: 192 mg/dL — ABNORMAL HIGH (ref 70–99)
Glucose-Capillary: 169 mg/dL — ABNORMAL HIGH (ref 70–99)
Glucose-Capillary: 142 mg/dL — ABNORMAL HIGH (ref 70–99)

## 2018-12-06 LAB — NOVEL CORONAVIRUS, NAA (HOSP ORDER, SEND-OUT TO REF LAB; TAT 18-24 HRS): SARS-CoV-2, NAA: NOT DETECTED

## 2018-12-06 MED ORDER — ADULT MULTIVITAMIN W/MINERALS CH: 1.0000 | ORAL_TABLET | Freq: Every day | ORAL | Status: AC

## 2018-12-06 NOTE — Progress Notes (Signed)
Patient ID: Brittany Russo, female   DOB: September 16, 1945, 73 y.o.   MRN: [id number] Subjective: 14 Days Post-Op Procedure(s) (LRB): repair right knee extensor mechanism (Right)    Patient reports pain as moderate. Still limited with her activity  Objective:   VITALS:   Vitals:   12/05/18 2242 12/06/18 0602  BP: (!) 124/45 (!) 119/46  Pulse: (!) 59 (!) 47  Resp: 18 17  Temp: (!) 97.4 F (36.3 C) (!) 97.4 F (36.3 C)  SpO2: 100% 100%    Neurovascular intact Incision: dressing C/D/I - dressing changed today prior to discharge  LABS Recent Labs    12/04/18 0731  HGB 10.5*  HCT 32.2*  WBC 5.1  PLT 78*    Recent Labs    12/04/18 0731  NA 128*  K 5.0  BUN 6*  CREATININE 0.79  GLUCOSE 185*    No results for input(s): LABPT, INR in the last 72 hours.   Assessment/Plan: 14 Days Post-Op Procedure(s) (LRB): repair right knee extensor mechanism (Right)   Up with therapy Discharge to SNF   Stress need for PT efforts for strengthening for transfers RLE in knee brace locked in extension - no flexion, passive or active WBAT in brace  Nutritional recommendations made and should be adhered to at SNF  RTC in 2 weeks  Follow up with PCP re nutrition and liver status (amonia level elevated) and overall health mentally and physically

## 2018-12-06 NOTE — Discharge Summary (Addendum)
Physician Discharge Summary  Patient ID: Brittany Russo MRN: [id number] DOB/AGE: Nov 12, 1945 73 y.o.  Admit date: 11/22/2018 Discharge date:  12/07/2018  Procedures:  Procedure(s) (LRB): repair right knee extensor mechanism (Right)  Attending Physician:  Dr. Paralee Cancel   Admission Diagnoses:   Extensor mechanism disruption   Discharge Diagnoses:  Active Problems:   Mechanical problem with extremity   Hematuria   Overweight (BMI 25.0-29.9)  Past Medical History:  Diagnosis Date  . Acute and subacute hepatic failure without coma   . Allergy   . Anasarca   . Anxiety   . Arthritis   . Ascites 09/2018   no SBP on 850 cc tap 10/13/18  . Cirrhosis of liver ([place]) ~ 2004   from NAFLD.  Dx ~ 2004.  Dr Dorcas Mcmurray at [facility]/    . Depression   . Diabetes mellitus without complication ([place])    type 2  . Eczema of both hands   . Endometrial cancer ([place]) 1996, 2003   hysterectomy 1996.  recurrence 2003, treated with radiation.    . Generalized edema   . GI hemorrhage   . Headache   . 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 in [place]  . Hyperlipidemia   . Hypertension   . Muscle weakness   . Seizures ([place])    last seizure June 07, 2014   . Thrombocytopenia ([place])   . Thyroid nodule     HPI:    Pt is a 73 y.o. female  7 weeks out from revision of right total knee arthroplasty to SROM hinged knee. She reports that she is doing fair. Her husband is with her in the office today. She is currently in a nursing facility. They have been trying to get her up and moving. She notes that she has significant discomfort with any leg lifts. She has continued to work with the therapists at [facility], but notes that she is quite discouraged with her progress. She has gotten up with the walker some, but her husband reports that she still needs to be supported with both a gait belt and a wheelchair behind her. She is in a  wheelchair in the office.  This represents a significantly complicated and unfortunate event after a fall immediately postoperatively from her knee while at home. She basically sheared the distal condyles off of her femur requiring revision to the hinged component as noted.  There is concern for extensor mechanism disruption based on clinical exam findings and radiographs.  Potential options discussed. At this time, the treatment plan includes - knee immobilizer dispensed to keep her knee straight and prevent further blanching of the skin with concern of skin breakdown and infection. This would be catastrophic.  At this point I think it is imperative for [place] to get back to the operating room and to evaluate the extensor mechanism to see if there is any hopes of reestablishing no soft tissue based coverage of her component and very importantly trying to restore some function to the lower extremity. I warned the patient and her husband that this ultimately could result in an above-the-knee amputation if her functional aspect is severely limited or worse she were to have skin breakdown or infection.  Given the nature of the patient's symptoms and the failure of conservative treatments to provide relief, operative intervention was deemed necessary. The decision to proceed with operative intervention was ultimately made, given postoperative complication(s) and continued intractable  pain. The procedure risks, benefits, side effects and alternatives of the left TKA revision were discussed at length with the patient. These risks include pain, infection, blood loss, injury to nearby structures including nerves and vessels, death, complications of anesthesia, need for further operations, cardiopulmonary complications and stiffness.  PCP: Rosine Door   Discharged Condition: fair  Hospital Course:  Patient underwent the above stated procedure on 11/22/2018. Patient tolerated the procedure well and brought to  the recovery room in good condition and subsequently to the floor.  She progressed slowly with PT through her visit.  During the whole visit stress was placed on no flexion of the knee, with failure of the fixation possibly progressing to potential amputation. During the visit on consult was was placed to general surgery who ruled out a rectovaginal fistula.  Nutrition was also consulted for nutritional status and recommendations.  Patient is ready to be discharged to a skilled nursing facility with emphysis on upper extremity strengthening to assist with tranfers and decrease stress on right lower extremity.  Right leg should keep brace locked in extension at all times - NO FLEXION OF THE KNEE EITHER PASSIVELY OR ACTIVELY. She can be WBAT .  Follow up in 2 weeks.  NUTRITIONAL CONSULT RECOMMENDATIONS: - Order Boost Breeze BID, each supplement provides 250 kcal and 9 grams of protein. - Insurance underwriter Cup with dinner meals, each supplement provides 290 kcal and 9 grams of protein. - Order daily multivitamin with minerals. - Continue to encourage PO intakes.     Discharge Exam: General appearance: alert, cooperative and no distress Extremities: Homans sign is negative, no sign of DVT, no edema, redness or tenderness in the calves or thighs and no ulcers, gangrene or trophic changes  Disposition: Skilled nursing facility with follow up in 2 weeks    Contact information for follow-up providers    Paralee Cancel, MD. Schedule an appointment as soon as possible for a visit in 2 weeks.   Specialty: Orthopedic Surgery Contact information: [address] [place] [address] [id number]            Contact information for after-discharge care    [facility] .   Service: Skilled Nursing Contact information: [address] [place] [phone]                  Discharge  Instructions    Call MD / Call 911   Complete by: As directed    If you experience chest pain or shortness of breath, CALL 911 and be transported to the hospital emergency room.  If you develope a fever above 101 F, pus (white drainage) or increased drainage or redness at the wound, or calf pain, call your surgeon's office.   Change dressing   Complete by: As directed    Maintain surgical dressing until follow up in the clinic. If the edges start to pull up, may reinforce with tape. If the dressing is no longer working, may remove and cover with gauze and tape, but must keep the area dry and clean.  Call with any questions or concerns.   Constipation Prevention   Complete by: As directed    Drink plenty of fluids.  Prune juice may be helpful.  You may use a stool softener, such as Colace (over the counter) 100 mg twice a day.  Use MiraLax (over the counter) for constipation as needed.  Diet - low sodium heart healthy   Complete by: As directed    NUTRITIONAL CONSULT RECOMMENDATIONS: - Order Boost Breeze BID, each supplement provides 250 kcal and 9 grams of protein. - Insurance underwriter Cup with dinner meals, each supplement provides 290 kcal and 9 grams of protein. - Order daily multivitamin with minerals. - Continue to encourage PO intakes.   Discharge instructions   Complete by: As directed    Maintain surgical dressing until follow up in the clinic. If the edges start to pull up, may reinforce with tape. If the dressing is no longer working, may remove and cover with gauze and tape, but must keep the area dry and clean.  Follow up in 2 weeks at [facility]. Call with any questions or concerns.   TED hose   Complete by: As directed    Use stockings (TED hose) for 2 weeks on both leg(s).  You may remove them at night for sleeping.   Weight bearing as tolerated   Complete by: As directed    Stress need for PT efforts for strengthening for transfers RLE in knee brace locked in  extension - NO FLEXION, PASSIVE OR ACTIVE WBAT in brace   Extremity: Lower      Allergies as of 12/07/2018      Reactions   Asa [aspirin] Other (See Comments)   Pt not allergic but cannot take due to bleeding    Other Other (See Comments)   Any jewelry metal-hives and itching       Medication List    TAKE these medications   ALPRAZolam 0.25 MG tablet Commonly known as: XANAX Take 1 tablet (0.25 mg total) by mouth 2 (two) times daily.   atenolol 50 MG tablet Commonly known as: TENORMIN Take 50 mg by mouth daily.   atorvastatin 80 MG tablet Commonly known as: LIPITOR Take 80 mg by mouth daily at 8 pm.   buPROPion 150 MG 24 hr tablet Commonly known as: WELLBUTRIN XL Take 150 mg by mouth daily.   Cartia XT 180 MG 24 hr capsule Generic drug: diltiazem Take 180 mg by mouth daily.   celecoxib 200 MG capsule Commonly known as: CELEBREX Take 1 capsule (200 mg total) by mouth 2 (two) times daily.   citalopram 40 MG tablet Commonly known as: CELEXA Take 40 mg by mouth daily.   docusate sodium 100 MG capsule Commonly known as: Colace Take 1 capsule (100 mg total) by mouth 2 (two) times daily.   ferrous sulfate 325 (65 FE) MG tablet Commonly known as: FerrouSul Take 1 tablet (325 mg total) by mouth 3 (three) times daily with meals for 14 days. What changed: when to take this   fluticasone 50 MCG/ACT nasal spray Commonly known as: FLONASE Place 1 spray into both nostrils daily.   furosemide 40 MG tablet Commonly known as: LASIX Take 1 tablet (40 mg total) by mouth daily.   glipiZIDE 5 MG tablet Commonly known as: GLUCOTROL Take 5 mg by mouth 2 (two) times daily.   Glucagon Emergency 1 MG injection Generic drug: glucagon Inject 1 mg into the muscle once as needed (blood sugar less than 60).   Gluco Burst 40 % Gel Generic drug: dextrose Take 1 Tube by mouth once as needed (blood sugar less than 60).   HYDROcodone-acetaminophen 5-325 MG tablet Commonly known  as: Norco Take 1-2 tablets by mouth every 6 (six) hours as needed for moderate pain or severe pain. What changed:   how much to  take  reasons to take this   insulin glargine 100 UNIT/ML injection Commonly known as: LANTUS Inject 65 Units into the skin daily at 6 (six) AM.   loratadine 10 MG tablet Commonly known as: CLARITIN Take 10 mg by mouth daily.   methocarbamol 500 MG tablet Commonly known as: Robaxin Take 1 tablet (500 mg total) by mouth every 6 (six) hours as needed for muscle spasms.   montelukast 10 MG tablet Commonly known as: SINGULAIR Take 10 mg by mouth at bedtime.   multivitamin with minerals Tabs tablet Take 1 tablet by mouth daily.   ondansetron 4 MG tablet Commonly known as: ZOFRAN Take 1 tablet (4 mg total) by mouth every 6 (six) hours as needed for nausea.   polyethylene glycol 17 g packet Commonly known as: MIRALAX / GLYCOLAX Take 17 g by mouth 2 (two) times daily.   potassium chloride 10 MEQ tablet Commonly known as: K-DUR Take 10 mEq by mouth daily.   rifaximin 550 MG Tabs tablet Commonly known as: XIFAXAN Take 550 mg by mouth 2 (two) times daily.   sitaGLIPtin-metformin 50-500 MG tablet Commonly known as: JANUMET Take 1 tablet by mouth 2 (two) times daily with a meal.   spironolactone 50 MG tablet Commonly known as: ALDACTONE Take 1 tablet (50 mg total) by mouth daily.            Discharge Care Instructions  (From admission, onward)         Start     Ordered   12/06/18 0000  Change dressing    Comments: Maintain surgical dressing until follow up in the clinic. If the edges start to pull up, may reinforce with tape. If the dressing is no longer working, may remove and cover with gauze and tape, but must keep the area dry and clean.  Call with any questions or concerns.   12/06/18 1008   12/06/18 0000  Weight bearing as tolerated    Comments: Stress need for PT efforts for strengthening for transfers RLE in knee brace locked in  extension - NO FLEXION, PASSIVE OR ACTIVE WBAT in brace  Question:  Extremity  Answer:  Lower   12/06/18 1008           Signed: [place]. Shruthi Northrup   PA-C  12/07/2018, 7:48 AM

## 2018-12-06 NOTE — Telephone Encounter (Signed)
Ann pls call patient to schedule an office visit next week as patient was admitted to [facility] hospital on 9/1. She needs ov before she can proceed with EGD/flex sig which is currently scheduled for 12/16/2018. Her EGD/flexi sig will need to be rescheduled as she underwent left TKA revision on 11/22/2018. Dr. Laural Golden will need to verify when pt can reschedule procedures. thx

## 2018-12-06 NOTE — Progress Notes (Signed)
See phone note, pt needs ov.

## 2018-12-06 NOTE — TOC Transition Note (Addendum)
Transition of Care [facility]) - CM/SW Progression Note Patient Details  Name: Brittany Russo MRN: [id number] Date of Birth: March 23, 1946  [facility]) CM/SW Contact:  Lia Hopping, LCSW Phone Number: 12/06/2018, 4:00 PM   Clinical Narrative:    Facility will accept patient tomorrow, COVID-19 test still pending.  Patient notified at bedside. Patient informed her family.    Final next level of care: Skilled Nursing Facility Barriers to Discharge: COVID-19 test pending.    Patient Goals and CMS Choice Patient states their goals for this hospitalization and ongoing recovery are:: to get better CMS Medicare.gov Compare Post Acute Care list provided to:: Patient Choice offered to / list presented to : Patient  Discharge Placement                       Discharge Plan and Services In-house Referral: Clinical Social Work   Post Acute Care Choice: [place]          DME Arranged: N/A DME Agency: NA       HH Arranged: NA HH Agency: NA        Social Determinants of Health (SDOH) Interventions     Readmission Risk Interventions No flowsheet data found.

## 2018-12-07 DIAGNOSIS — Z91048 Other nonmedicinal substance allergy status: Secondary | ICD-10-CM | POA: Diagnosis not present

## 2018-12-07 DIAGNOSIS — E875 Hyperkalemia: Secondary | ICD-10-CM | POA: Diagnosis present

## 2018-12-07 DIAGNOSIS — B952 Enterococcus as the cause of diseases classified elsewhere: Secondary | ICD-10-CM | POA: Diagnosis not present

## 2018-12-07 DIAGNOSIS — R531 Weakness: Secondary | ICD-10-CM | POA: Diagnosis not present

## 2018-12-07 DIAGNOSIS — E1165 Type 2 diabetes mellitus with hyperglycemia: Secondary | ICD-10-CM | POA: Diagnosis present

## 2018-12-07 DIAGNOSIS — E860 Dehydration: Secondary | ICD-10-CM | POA: Diagnosis present

## 2018-12-07 DIAGNOSIS — T84092A Other mechanical complication of internal right knee prosthesis, initial encounter: Secondary | ICD-10-CM | POA: Diagnosis present

## 2018-12-07 DIAGNOSIS — Z4789 Encounter for other orthopedic aftercare: Secondary | ICD-10-CM | POA: Diagnosis not present

## 2018-12-07 DIAGNOSIS — W19XXXA Unspecified fall, initial encounter: Secondary | ICD-10-CM | POA: Diagnosis not present

## 2018-12-07 DIAGNOSIS — K7460 Unspecified cirrhosis of liver: Secondary | ICD-10-CM | POA: Diagnosis present

## 2018-12-07 DIAGNOSIS — K7200 Acute and subacute hepatic failure without coma: Secondary | ICD-10-CM | POA: Diagnosis present

## 2018-12-07 DIAGNOSIS — Z471 Aftercare following joint replacement surgery: Secondary | ICD-10-CM | POA: Diagnosis not present

## 2018-12-07 DIAGNOSIS — Y792 Prosthetic and other implants, materials and accessory orthopedic devices associated with adverse incidents: Secondary | ICD-10-CM | POA: Diagnosis present

## 2018-12-07 DIAGNOSIS — Y9289 Other specified places as the place of occurrence of the external cause: Secondary | ICD-10-CM | POA: Diagnosis not present

## 2018-12-07 DIAGNOSIS — R739 Hyperglycemia, unspecified: Secondary | ICD-10-CM | POA: Diagnosis not present

## 2018-12-07 DIAGNOSIS — F419 Anxiety disorder, unspecified: Secondary | ICD-10-CM | POA: Diagnosis not present

## 2018-12-07 DIAGNOSIS — Z9181 History of falling: Secondary | ICD-10-CM | POA: Diagnosis not present

## 2018-12-07 DIAGNOSIS — Z7401 Bed confinement status: Secondary | ICD-10-CM | POA: Diagnosis not present

## 2018-12-07 DIAGNOSIS — N3091 Cystitis, unspecified with hematuria: Secondary | ICD-10-CM | POA: Diagnosis present

## 2018-12-07 DIAGNOSIS — E871 Hypo-osmolality and hyponatremia: Secondary | ICD-10-CM | POA: Diagnosis present

## 2018-12-07 DIAGNOSIS — Z96651 Presence of right artificial knee joint: Secondary | ICD-10-CM | POA: Diagnosis not present

## 2018-12-07 DIAGNOSIS — F339 Major depressive disorder, recurrent, unspecified: Secondary | ICD-10-CM | POA: Diagnosis not present

## 2018-12-07 DIAGNOSIS — E872 Acidosis: Secondary | ICD-10-CM | POA: Diagnosis present

## 2018-12-07 DIAGNOSIS — T84012A Broken internal right knee prosthesis, initial encounter: Secondary | ICD-10-CM | POA: Diagnosis not present

## 2018-12-07 DIAGNOSIS — K2940 Chronic atrophic gastritis without bleeding: Secondary | ICD-10-CM | POA: Diagnosis present

## 2018-12-07 DIAGNOSIS — D61818 Other pancytopenia: Secondary | ICD-10-CM | POA: Diagnosis present

## 2018-12-07 DIAGNOSIS — B9689 Other specified bacterial agents as the cause of diseases classified elsewhere: Secondary | ICD-10-CM | POA: Diagnosis not present

## 2018-12-07 DIAGNOSIS — T84021D Dislocation of internal left hip prosthesis, subsequent encounter: Secondary | ICD-10-CM | POA: Diagnosis not present

## 2018-12-07 DIAGNOSIS — N390 Urinary tract infection, site not specified: Secondary | ICD-10-CM | POA: Diagnosis not present

## 2018-12-07 DIAGNOSIS — G40909 Epilepsy, unspecified, not intractable, without status epilepticus: Secondary | ICD-10-CM | POA: Diagnosis present

## 2018-12-07 DIAGNOSIS — D696 Thrombocytopenia, unspecified: Secondary | ICD-10-CM | POA: Diagnosis not present

## 2018-12-07 DIAGNOSIS — K7581 Nonalcoholic steatohepatitis (NASH): Secondary | ICD-10-CM | POA: Diagnosis present

## 2018-12-07 DIAGNOSIS — R2689 Other abnormalities of gait and mobility: Secondary | ICD-10-CM | POA: Diagnosis not present

## 2018-12-07 DIAGNOSIS — R319 Hematuria, unspecified: Secondary | ICD-10-CM | POA: Diagnosis not present

## 2018-12-07 DIAGNOSIS — Y842 Radiological procedure and radiotherapy as the cause of abnormal reaction of the patient, or of later complication, without mention of misadventure at the time of the procedure: Secondary | ICD-10-CM | POA: Diagnosis present

## 2018-12-07 DIAGNOSIS — K921 Melena: Secondary | ICD-10-CM | POA: Diagnosis not present

## 2018-12-07 DIAGNOSIS — A498 Other bacterial infections of unspecified site: Secondary | ICD-10-CM | POA: Diagnosis not present

## 2018-12-07 DIAGNOSIS — K627 Radiation proctitis: Secondary | ICD-10-CM | POA: Diagnosis present

## 2018-12-07 DIAGNOSIS — Z03818 Encounter for observation for suspected exposure to other biological agents ruled out: Secondary | ICD-10-CM | POA: Diagnosis not present

## 2018-12-07 DIAGNOSIS — R001 Bradycardia, unspecified: Secondary | ICD-10-CM | POA: Diagnosis not present

## 2018-12-07 DIAGNOSIS — R5381 Other malaise: Secondary | ICD-10-CM | POA: Diagnosis not present

## 2018-12-07 DIAGNOSIS — E1140 Type 2 diabetes mellitus with diabetic neuropathy, unspecified: Secondary | ICD-10-CM | POA: Diagnosis not present

## 2018-12-07 DIAGNOSIS — E785 Hyperlipidemia, unspecified: Secondary | ICD-10-CM | POA: Diagnosis present

## 2018-12-07 DIAGNOSIS — R410 Disorientation, unspecified: Secondary | ICD-10-CM | POA: Diagnosis not present

## 2018-12-07 DIAGNOSIS — M6281 Muscle weakness (generalized): Secondary | ICD-10-CM | POA: Diagnosis not present

## 2018-12-07 DIAGNOSIS — M2550 Pain in unspecified joint: Secondary | ICD-10-CM | POA: Diagnosis not present

## 2018-12-07 DIAGNOSIS — N179 Acute kidney failure, unspecified: Secondary | ICD-10-CM | POA: Diagnosis present

## 2018-12-07 DIAGNOSIS — K7290 Hepatic failure, unspecified without coma: Secondary | ICD-10-CM | POA: Diagnosis present

## 2018-12-07 DIAGNOSIS — T84012D Broken internal right knee prosthesis, subsequent encounter: Secondary | ICD-10-CM | POA: Diagnosis not present

## 2018-12-07 DIAGNOSIS — K449 Diaphragmatic hernia without obstruction or gangrene: Secondary | ICD-10-CM | POA: Diagnosis present

## 2018-12-07 DIAGNOSIS — D62 Acute posthemorrhagic anemia: Secondary | ICD-10-CM | POA: Diagnosis not present

## 2018-12-07 DIAGNOSIS — G92 Toxic encephalopathy: Secondary | ICD-10-CM | POA: Diagnosis present

## 2018-12-07 DIAGNOSIS — R41841 Cognitive communication deficit: Secondary | ICD-10-CM | POA: Diagnosis not present

## 2018-12-07 DIAGNOSIS — Z20828 Contact with and (suspected) exposure to other viral communicable diseases: Secondary | ICD-10-CM | POA: Diagnosis present

## 2018-12-07 DIAGNOSIS — Z23 Encounter for immunization: Secondary | ICD-10-CM | POA: Diagnosis not present

## 2018-12-07 DIAGNOSIS — I10 Essential (primary) hypertension: Secondary | ICD-10-CM | POA: Diagnosis present

## 2018-12-07 LAB — GLUCOSE, CAPILLARY: Glucose-Capillary: 93 mg/dL (ref 70–99)

## 2018-12-07 NOTE — Progress Notes (Signed)
     Subjective: 15 Days Post-Op Procedure(s) (LRB): repair right knee extensor mechanism (Right)   Patient reports pain as mild, pain controlled. No reported events throughout the night. Covid test resulted negative and now is accepted to go to SNF.  Condition is stable and is ready to be d/c'ed to skilled nursing facility.    Objective:   VITALS:   Vitals:   12/06/18 2204 12/07/18 0439  BP: (!) 117/52 (!) 133/58  Pulse: 65 (!) 59  Resp: 16 16  Temp: 97.9 F (36.6 C) 98.1 F (36.7 C)  SpO2: 100% 99%    Incision: dressing C/D/I No cellulitis present Compartment soft  LABS No new labs   Assessment/Plan: 15 Days Post-Op Procedure(s) (LRB): repair right knee extensor mechanism (Right) Up with therapy Discharge to SNF  Follow up in 2 weeks at [facility] Follow up with OLIN,[place] Mccle[place] D in 2 weeks.  Contact information:  [facility] [address] [place]   PAC  12/07/2018, 8:50 AM

## 2018-12-07 NOTE — Plan of Care (Signed)
  Problem: Health Behavior/Discharge Planning: Goal: Ability to manage health-related needs will improve Outcome: Progressing   Problem: Nutrition: Goal: Adequate nutrition will be maintained Outcome: Progressing   Problem: Coping: Goal: Level of anxiety will decrease Outcome: Progressing   Problem: Pain Managment: Goal: General experience of comfort will improve Outcome: Progressing   

## 2018-12-07 NOTE — TOC Transition Note (Signed)
Transition of Care [facility]) - CM/SW Discharge Note   Patient Details  Name: Brittany Russo MRN: [id number] Date of Birth: 06/30/1945  Transition of [facility]) CM/SW Contact:  Lia Hopping, [place] Phone Number: 12/07/2018, 10:26 AM   Clinical Narrative:    Patient will transition to [facility] today. COVID-19 test results faxed.  [facility] to transport.  Nurse call report to: [phone] Room 129   Final next level of care: Skilled Nursing Facility Barriers to Discharge: No Barriers Identified   Patient Goals and CMS Choice Patient states their goals for this hospitalization and ongoing recovery are:: To get better CMS Medicare.gov Compare Post Acute Care list provided to:: Patient Choice offered to / list presented to : Patient  Discharge Placement              Patient chooses bed at: [facility] Patient to be transferred to facility by: [facility] Name of family member notified: Daughter Patient and family notified of of transfer: 12/07/18  Discharge Plan and Services In-house Referral: Clinical Social Work   Post Acute Care Choice: [place]          DME Arranged: N/A DME Agency: NA       HH Arranged: NA HH Agency: NA        Social Determinants of Health (SDOH) Interventions     Readmission Risk Interventions No flowsheet data found.

## 2018-12-07 NOTE — Plan of Care (Signed)
plan

## 2018-12-07 NOTE — Telephone Encounter (Signed)
Ann, I consulted with Dr. Laural Golden, he verified EGD and flex sig 12/16/2018 should be canceled. Please schedule office follow up visit in 4 weeks instead of next week. EGD and flex sig will be reordered at the time of her follow up appt. thx

## 2018-12-07 NOTE — Progress Notes (Signed)
Report called to Roxanne at [employer] Belongings with patient. Wheel chair and pad and lift with PTAR. Family notified of patients transfer. Bethann Punches RN

## 2018-12-08 NOTE — Telephone Encounter (Signed)
Procedures have been canceled and she has OV sch'd 01/17/19 at 2 pm - I have spoke to her nurse at [facility] advising we canceled procedures and have an OV sch'd to reassess

## 2018-12-10 DIAGNOSIS — T84012D Broken internal right knee prosthesis, subsequent encounter: Secondary | ICD-10-CM | POA: Diagnosis not present

## 2018-12-10 DIAGNOSIS — D62 Acute posthemorrhagic anemia: Secondary | ICD-10-CM | POA: Diagnosis not present

## 2018-12-10 DIAGNOSIS — E1165 Type 2 diabetes mellitus with hyperglycemia: Secondary | ICD-10-CM | POA: Diagnosis not present

## 2018-12-14 ENCOUNTER — Other Ambulatory Visit (HOSPITAL_COMMUNITY): Admission: RE | Admit: 2018-12-14 | Payer: Medicare Other | Source: Ambulatory Visit

## 2018-12-16 ENCOUNTER — Encounter (HOSPITAL_COMMUNITY): Payer: Self-pay

## 2018-12-16 ENCOUNTER — Ambulatory Visit (HOSPITAL_COMMUNITY): Admit: 2018-12-16 | Payer: Medicare Other | Admitting: Internal Medicine

## 2018-12-16 ENCOUNTER — Other Ambulatory Visit (HOSPITAL_COMMUNITY): Payer: Medicare Other

## 2018-12-16 SURGERY — ESOPHAGOGASTRODUODENOSCOPY (EGD) WITH PROPOFOL
Anesthesia: Monitor Anesthesia Care

## 2018-12-22 DIAGNOSIS — Y842 Radiological procedure and radiotherapy as the cause of abnormal reaction of the patient, or of later complication, without mention of misadventure at the time of the procedure: Secondary | ICD-10-CM | POA: Diagnosis present

## 2018-12-22 DIAGNOSIS — E871 Hypo-osmolality and hyponatremia: Secondary | ICD-10-CM | POA: Diagnosis present

## 2018-12-22 DIAGNOSIS — K7290 Hepatic failure, unspecified without coma: Secondary | ICD-10-CM | POA: Diagnosis present

## 2018-12-22 DIAGNOSIS — R001 Bradycardia, unspecified: Secondary | ICD-10-CM | POA: Diagnosis not present

## 2018-12-22 DIAGNOSIS — R2689 Other abnormalities of gait and mobility: Secondary | ICD-10-CM | POA: Diagnosis not present

## 2018-12-22 DIAGNOSIS — Z91048 Other nonmedicinal substance allergy status: Secondary | ICD-10-CM | POA: Diagnosis not present

## 2018-12-22 DIAGNOSIS — A498 Other bacterial infections of unspecified site: Secondary | ICD-10-CM | POA: Diagnosis not present

## 2018-12-22 DIAGNOSIS — E1165 Type 2 diabetes mellitus with hyperglycemia: Secondary | ICD-10-CM | POA: Diagnosis present

## 2018-12-22 DIAGNOSIS — I10 Essential (primary) hypertension: Secondary | ICD-10-CM | POA: Diagnosis present

## 2018-12-22 DIAGNOSIS — M6281 Muscle weakness (generalized): Secondary | ICD-10-CM | POA: Diagnosis not present

## 2018-12-22 DIAGNOSIS — Z4789 Encounter for other orthopedic aftercare: Secondary | ICD-10-CM | POA: Diagnosis not present

## 2018-12-22 DIAGNOSIS — F339 Major depressive disorder, recurrent, unspecified: Secondary | ICD-10-CM | POA: Diagnosis not present

## 2018-12-22 DIAGNOSIS — G40909 Epilepsy, unspecified, not intractable, without status epilepticus: Secondary | ICD-10-CM | POA: Diagnosis present

## 2018-12-22 DIAGNOSIS — Z9181 History of falling: Secondary | ICD-10-CM | POA: Diagnosis not present

## 2018-12-22 DIAGNOSIS — Y792 Prosthetic and other implants, materials and accessory orthopedic devices associated with adverse incidents: Secondary | ICD-10-CM | POA: Diagnosis present

## 2018-12-22 DIAGNOSIS — K2940 Chronic atrophic gastritis without bleeding: Secondary | ICD-10-CM | POA: Diagnosis present

## 2018-12-22 DIAGNOSIS — Z23 Encounter for immunization: Secondary | ICD-10-CM | POA: Diagnosis not present

## 2018-12-22 DIAGNOSIS — R739 Hyperglycemia, unspecified: Secondary | ICD-10-CM | POA: Diagnosis not present

## 2018-12-22 DIAGNOSIS — D62 Acute posthemorrhagic anemia: Secondary | ICD-10-CM | POA: Diagnosis not present

## 2018-12-22 DIAGNOSIS — E872 Acidosis: Secondary | ICD-10-CM | POA: Diagnosis present

## 2018-12-22 DIAGNOSIS — Z20828 Contact with and (suspected) exposure to other viral communicable diseases: Secondary | ICD-10-CM | POA: Diagnosis present

## 2018-12-22 DIAGNOSIS — K7581 Nonalcoholic steatohepatitis (NASH): Secondary | ICD-10-CM | POA: Diagnosis present

## 2018-12-22 DIAGNOSIS — Z03818 Encounter for observation for suspected exposure to other biological agents ruled out: Secondary | ICD-10-CM | POA: Diagnosis not present

## 2018-12-22 DIAGNOSIS — D61818 Other pancytopenia: Secondary | ICD-10-CM | POA: Diagnosis present

## 2018-12-22 DIAGNOSIS — B952 Enterococcus as the cause of diseases classified elsewhere: Secondary | ICD-10-CM | POA: Diagnosis not present

## 2018-12-22 DIAGNOSIS — K627 Radiation proctitis: Secondary | ICD-10-CM | POA: Diagnosis present

## 2018-12-22 DIAGNOSIS — K449 Diaphragmatic hernia without obstruction or gangrene: Secondary | ICD-10-CM | POA: Diagnosis present

## 2018-12-22 DIAGNOSIS — K921 Melena: Secondary | ICD-10-CM | POA: Diagnosis not present

## 2018-12-22 DIAGNOSIS — B9689 Other specified bacterial agents as the cause of diseases classified elsewhere: Secondary | ICD-10-CM | POA: Diagnosis not present

## 2018-12-22 DIAGNOSIS — E860 Dehydration: Secondary | ICD-10-CM | POA: Diagnosis present

## 2018-12-22 DIAGNOSIS — N390 Urinary tract infection, site not specified: Secondary | ICD-10-CM | POA: Diagnosis not present

## 2018-12-22 DIAGNOSIS — Z471 Aftercare following joint replacement surgery: Secondary | ICD-10-CM | POA: Diagnosis not present

## 2018-12-22 DIAGNOSIS — N3091 Cystitis, unspecified with hematuria: Secondary | ICD-10-CM | POA: Diagnosis present

## 2018-12-22 DIAGNOSIS — R410 Disorientation, unspecified: Secondary | ICD-10-CM | POA: Diagnosis not present

## 2018-12-22 DIAGNOSIS — K7200 Acute and subacute hepatic failure without coma: Secondary | ICD-10-CM | POA: Diagnosis present

## 2018-12-22 DIAGNOSIS — N179 Acute kidney failure, unspecified: Secondary | ICD-10-CM | POA: Diagnosis present

## 2018-12-22 DIAGNOSIS — Z96651 Presence of right artificial knee joint: Secondary | ICD-10-CM | POA: Diagnosis not present

## 2018-12-22 DIAGNOSIS — E1140 Type 2 diabetes mellitus with diabetic neuropathy, unspecified: Secondary | ICD-10-CM | POA: Diagnosis not present

## 2018-12-22 DIAGNOSIS — E875 Hyperkalemia: Secondary | ICD-10-CM | POA: Diagnosis present

## 2018-12-22 DIAGNOSIS — E785 Hyperlipidemia, unspecified: Secondary | ICD-10-CM | POA: Diagnosis present

## 2018-12-22 DIAGNOSIS — G92 Toxic encephalopathy: Secondary | ICD-10-CM | POA: Diagnosis present

## 2018-12-22 DIAGNOSIS — T84021D Dislocation of internal left hip prosthesis, subsequent encounter: Secondary | ICD-10-CM | POA: Diagnosis not present

## 2018-12-22 DIAGNOSIS — K7460 Unspecified cirrhosis of liver: Secondary | ICD-10-CM | POA: Diagnosis present

## 2018-12-22 DIAGNOSIS — D696 Thrombocytopenia, unspecified: Secondary | ICD-10-CM | POA: Diagnosis not present

## 2018-12-22 DIAGNOSIS — R41841 Cognitive communication deficit: Secondary | ICD-10-CM | POA: Diagnosis not present

## 2018-12-22 DIAGNOSIS — T84092A Other mechanical complication of internal right knee prosthesis, initial encounter: Secondary | ICD-10-CM | POA: Diagnosis present

## 2018-12-22 DIAGNOSIS — F419 Anxiety disorder, unspecified: Secondary | ICD-10-CM | POA: Diagnosis not present

## 2018-12-22 DIAGNOSIS — T84012A Broken internal right knee prosthesis, initial encounter: Secondary | ICD-10-CM | POA: Diagnosis not present

## 2018-12-22 DIAGNOSIS — R319 Hematuria, unspecified: Secondary | ICD-10-CM | POA: Diagnosis not present

## 2018-12-22 DIAGNOSIS — T84012D Broken internal right knee prosthesis, subsequent encounter: Secondary | ICD-10-CM | POA: Diagnosis not present

## 2018-12-22 DIAGNOSIS — Y9289 Other specified places as the place of occurrence of the external cause: Secondary | ICD-10-CM | POA: Diagnosis not present

## 2018-12-24 DIAGNOSIS — T84021D Dislocation of internal left hip prosthesis, subsequent encounter: Secondary | ICD-10-CM | POA: Diagnosis not present

## 2018-12-24 DIAGNOSIS — E1165 Type 2 diabetes mellitus with hyperglycemia: Secondary | ICD-10-CM | POA: Diagnosis not present

## 2018-12-24 DIAGNOSIS — D62 Acute posthemorrhagic anemia: Secondary | ICD-10-CM | POA: Diagnosis not present

## 2018-12-28 ENCOUNTER — Inpatient Hospital Stay (HOSPITAL_COMMUNITY)
Admission: EM | Admit: 2018-12-28 | Discharge: 2019-01-06 | DRG: 981 | Disposition: A | Discharge status: Skilled Nursing Facility | Payer: Medicare Other | Source: Other Acute Inpatient Hospital | Attending: Family Medicine | Admitting: Family Medicine

## 2018-12-28 ENCOUNTER — Other Ambulatory Visit: Payer: Self-pay

## 2018-12-28 ENCOUNTER — Encounter (HOSPITAL_COMMUNITY): Payer: Self-pay | Admitting: Emergency Medicine

## 2018-12-28 ENCOUNTER — Emergency Department (HOSPITAL_COMMUNITY): Payer: Medicare Other

## 2018-12-28 DIAGNOSIS — E875 Hyperkalemia: Secondary | ICD-10-CM | POA: Diagnosis present

## 2018-12-28 DIAGNOSIS — T424X5A Adverse effect of benzodiazepines, initial encounter: Secondary | ICD-10-CM | POA: Diagnosis present

## 2018-12-28 DIAGNOSIS — K7460 Unspecified cirrhosis of liver: Secondary | ICD-10-CM | POA: Diagnosis present

## 2018-12-28 DIAGNOSIS — K7290 Hepatic failure, unspecified without coma: Secondary | ICD-10-CM | POA: Diagnosis present

## 2018-12-28 DIAGNOSIS — N390 Urinary tract infection, site not specified: Secondary | ICD-10-CM

## 2018-12-28 DIAGNOSIS — Z886 Allergy status to analgesic agent status: Secondary | ICD-10-CM

## 2018-12-28 DIAGNOSIS — R319 Hematuria, unspecified: Secondary | ICD-10-CM | POA: Diagnosis not present

## 2018-12-28 DIAGNOSIS — D62 Acute posthemorrhagic anemia: Secondary | ICD-10-CM | POA: Diagnosis not present

## 2018-12-28 DIAGNOSIS — Z96651 Presence of right artificial knee joint: Secondary | ICD-10-CM | POA: Diagnosis present

## 2018-12-28 DIAGNOSIS — Z23 Encounter for immunization: Secondary | ICD-10-CM

## 2018-12-28 DIAGNOSIS — D61818 Other pancytopenia: Secondary | ICD-10-CM | POA: Diagnosis not present

## 2018-12-28 DIAGNOSIS — I959 Hypotension, unspecified: Secondary | ICD-10-CM | POA: Diagnosis not present

## 2018-12-28 DIAGNOSIS — G92 Toxic encephalopathy: Secondary | ICD-10-CM | POA: Diagnosis not present

## 2018-12-28 DIAGNOSIS — E669 Obesity, unspecified: Secondary | ICD-10-CM | POA: Diagnosis present

## 2018-12-28 DIAGNOSIS — N179 Acute kidney failure, unspecified: Secondary | ICD-10-CM | POA: Diagnosis present

## 2018-12-28 DIAGNOSIS — R739 Hyperglycemia, unspecified: Secondary | ICD-10-CM

## 2018-12-28 DIAGNOSIS — T84012D Broken internal right knee prosthesis, subsequent encounter: Secondary | ICD-10-CM | POA: Diagnosis not present

## 2018-12-28 DIAGNOSIS — R188 Other ascites: Secondary | ICD-10-CM | POA: Diagnosis not present

## 2018-12-28 DIAGNOSIS — I10 Essential (primary) hypertension: Secondary | ICD-10-CM | POA: Diagnosis present

## 2018-12-28 DIAGNOSIS — K627 Radiation proctitis: Secondary | ICD-10-CM | POA: Diagnosis present

## 2018-12-28 DIAGNOSIS — F419 Anxiety disorder, unspecified: Secondary | ICD-10-CM | POA: Diagnosis not present

## 2018-12-28 DIAGNOSIS — Y792 Prosthetic and other implants, materials and accessory orthopedic devices associated with adverse incidents: Secondary | ICD-10-CM | POA: Diagnosis present

## 2018-12-28 DIAGNOSIS — E872 Acidosis: Secondary | ICD-10-CM | POA: Diagnosis present

## 2018-12-28 DIAGNOSIS — K2940 Chronic atrophic gastritis without bleeding: Secondary | ICD-10-CM | POA: Diagnosis not present

## 2018-12-28 DIAGNOSIS — K449 Diaphragmatic hernia without obstruction or gangrene: Secondary | ICD-10-CM | POA: Diagnosis not present

## 2018-12-28 DIAGNOSIS — K7200 Acute and subacute hepatic failure without coma: Secondary | ICD-10-CM | POA: Diagnosis not present

## 2018-12-28 DIAGNOSIS — Z87440 Personal history of urinary (tract) infections: Secondary | ICD-10-CM

## 2018-12-28 DIAGNOSIS — Z20828 Contact with and (suspected) exposure to other viral communicable diseases: Secondary | ICD-10-CM | POA: Diagnosis present

## 2018-12-28 DIAGNOSIS — K7581 Nonalcoholic steatohepatitis (NASH): Secondary | ICD-10-CM | POA: Diagnosis present

## 2018-12-28 DIAGNOSIS — Z90710 Acquired absence of both cervix and uterus: Secondary | ICD-10-CM

## 2018-12-28 DIAGNOSIS — Y842 Radiological procedure and radiotherapy as the cause of abnormal reaction of the patient, or of later complication, without mention of misadventure at the time of the procedure: Secondary | ICD-10-CM | POA: Diagnosis present

## 2018-12-28 DIAGNOSIS — T84092A Other mechanical complication of internal right knee prosthesis, initial encounter: Secondary | ICD-10-CM | POA: Diagnosis present

## 2018-12-28 DIAGNOSIS — E1069 Type 1 diabetes mellitus with other specified complication: Secondary | ICD-10-CM | POA: Diagnosis not present

## 2018-12-28 DIAGNOSIS — Z8249 Family history of ischemic heart disease and other diseases of the circulatory system: Secondary | ICD-10-CM

## 2018-12-28 DIAGNOSIS — R410 Disorientation, unspecified: Secondary | ICD-10-CM | POA: Diagnosis not present

## 2018-12-28 DIAGNOSIS — E860 Dehydration: Secondary | ICD-10-CM | POA: Diagnosis present

## 2018-12-28 DIAGNOSIS — A498 Other bacterial infections of unspecified site: Secondary | ICD-10-CM | POA: Diagnosis not present

## 2018-12-28 DIAGNOSIS — E1165 Type 2 diabetes mellitus with hyperglycemia: Secondary | ICD-10-CM | POA: Diagnosis present

## 2018-12-28 DIAGNOSIS — B9689 Other specified bacterial agents as the cause of diseases classified elsewhere: Secondary | ICD-10-CM | POA: Diagnosis not present

## 2018-12-28 DIAGNOSIS — E871 Hypo-osmolality and hyponatremia: Secondary | ICD-10-CM | POA: Diagnosis present

## 2018-12-28 DIAGNOSIS — R001 Bradycardia, unspecified: Secondary | ICD-10-CM | POA: Diagnosis not present

## 2018-12-28 DIAGNOSIS — Y9289 Other specified places as the place of occurrence of the external cause: Secondary | ICD-10-CM

## 2018-12-28 DIAGNOSIS — Z03818 Encounter for observation for suspected exposure to other biological agents ruled out: Secondary | ICD-10-CM | POA: Diagnosis not present

## 2018-12-28 DIAGNOSIS — F29 Unspecified psychosis not due to a substance or known physiological condition: Secondary | ICD-10-CM | POA: Diagnosis not present

## 2018-12-28 DIAGNOSIS — Z794 Long term (current) use of insulin: Secondary | ICD-10-CM

## 2018-12-28 DIAGNOSIS — T84012A Broken internal right knee prosthesis, initial encounter: Secondary | ICD-10-CM

## 2018-12-28 DIAGNOSIS — T380X5A Adverse effect of glucocorticoids and synthetic analogues, initial encounter: Secondary | ICD-10-CM | POA: Diagnosis not present

## 2018-12-28 DIAGNOSIS — D500 Iron deficiency anemia secondary to blood loss (chronic): Secondary | ICD-10-CM | POA: Diagnosis not present

## 2018-12-28 DIAGNOSIS — Z91048 Other nonmedicinal substance allergy status: Secondary | ICD-10-CM

## 2018-12-28 DIAGNOSIS — Z6831 Body mass index (BMI) 31.0-31.9, adult: Secondary | ICD-10-CM

## 2018-12-28 DIAGNOSIS — K7682 Hepatic encephalopathy: Secondary | ICD-10-CM

## 2018-12-28 DIAGNOSIS — R41841 Cognitive communication deficit: Secondary | ICD-10-CM | POA: Diagnosis not present

## 2018-12-28 DIAGNOSIS — Z8542 Personal history of malignant neoplasm of other parts of uterus: Secondary | ICD-10-CM

## 2018-12-28 DIAGNOSIS — K633 Ulcer of intestine: Secondary | ICD-10-CM | POA: Diagnosis not present

## 2018-12-28 DIAGNOSIS — E119 Type 2 diabetes mellitus without complications: Secondary | ICD-10-CM | POA: Diagnosis not present

## 2018-12-28 DIAGNOSIS — T84092D Other mechanical complication of internal right knee prosthesis, subsequent encounter: Secondary | ICD-10-CM | POA: Diagnosis not present

## 2018-12-28 DIAGNOSIS — R278 Other lack of coordination: Secondary | ICD-10-CM | POA: Diagnosis not present

## 2018-12-28 DIAGNOSIS — F330 Major depressive disorder, recurrent, mild: Secondary | ICD-10-CM | POA: Diagnosis not present

## 2018-12-28 DIAGNOSIS — G40909 Epilepsy, unspecified, not intractable, without status epilepticus: Secondary | ICD-10-CM | POA: Diagnosis present

## 2018-12-28 DIAGNOSIS — R609 Edema, unspecified: Secondary | ICD-10-CM | POA: Diagnosis not present

## 2018-12-28 DIAGNOSIS — N3091 Cystitis, unspecified with hematuria: Secondary | ICD-10-CM | POA: Diagnosis not present

## 2018-12-28 DIAGNOSIS — K921 Melena: Secondary | ICD-10-CM | POA: Diagnosis not present

## 2018-12-28 DIAGNOSIS — K317 Polyp of stomach and duodenum: Secondary | ICD-10-CM | POA: Diagnosis present

## 2018-12-28 DIAGNOSIS — D696 Thrombocytopenia, unspecified: Secondary | ICD-10-CM | POA: Diagnosis not present

## 2018-12-28 DIAGNOSIS — R627 Adult failure to thrive: Secondary | ICD-10-CM | POA: Diagnosis present

## 2018-12-28 DIAGNOSIS — E785 Hyperlipidemia, unspecified: Secondary | ICD-10-CM | POA: Diagnosis present

## 2018-12-28 DIAGNOSIS — Z743 Need for continuous supervision: Secondary | ICD-10-CM | POA: Diagnosis not present

## 2018-12-28 DIAGNOSIS — Z471 Aftercare following joint replacement surgery: Secondary | ICD-10-CM | POA: Diagnosis not present

## 2018-12-28 DIAGNOSIS — R310 Gross hematuria: Secondary | ICD-10-CM | POA: Diagnosis not present

## 2018-12-28 DIAGNOSIS — B952 Enterococcus as the cause of diseases classified elsewhere: Secondary | ICD-10-CM | POA: Diagnosis present

## 2018-12-28 DIAGNOSIS — T428X5A Adverse effect of antiparkinsonism drugs and other central muscle-tone depressants, initial encounter: Secondary | ICD-10-CM | POA: Diagnosis present

## 2018-12-28 DIAGNOSIS — R279 Unspecified lack of coordination: Secondary | ICD-10-CM | POA: Diagnosis not present

## 2018-12-28 DIAGNOSIS — M6281 Muscle weakness (generalized): Secondary | ICD-10-CM | POA: Diagnosis not present

## 2018-12-28 DIAGNOSIS — Z833 Family history of diabetes mellitus: Secondary | ICD-10-CM

## 2018-12-28 DIAGNOSIS — Z923 Personal history of irradiation: Secondary | ICD-10-CM

## 2018-12-28 DIAGNOSIS — T391X5A Adverse effect of 4-Aminophenol derivatives, initial encounter: Secondary | ICD-10-CM | POA: Diagnosis present

## 2018-12-28 DIAGNOSIS — G8918 Other acute postprocedural pain: Secondary | ICD-10-CM | POA: Diagnosis not present

## 2018-12-28 DIAGNOSIS — R2689 Other abnormalities of gait and mobility: Secondary | ICD-10-CM | POA: Diagnosis not present

## 2018-12-28 LAB — URINALYSIS, ROUTINE W REFLEX MICROSCOPIC
Bilirubin Urine: NEGATIVE
Glucose, UA: NEGATIVE mg/dL
Ketones, ur: NEGATIVE mg/dL
Nitrite: NEGATIVE
Protein, ur: 100 mg/dL — AB
RBC / HPF: >50 RBC/hpf — ABNORMAL HIGH (ref 0–5)
Specific Gravity, Urine: 1.016 (ref 1.005–1.030)
WBC, UA: >50 WBC/hpf — ABNORMAL HIGH (ref 0–5)
pH: 5 (ref 5.0–8.0)

## 2018-12-28 LAB — CBC WITH DIFFERENTIAL/PLATELET
Abs Immature Granulocytes: 0.03 10*3/uL (ref 0.00–0.07)
Basophils Absolute: 0.1 10*3/uL (ref 0.0–0.1)
Basophils Relative: 1 %
Eosinophils Absolute: 0.1 10*3/uL (ref 0.0–0.5)
Eosinophils Relative: 1 %
HCT: 36.1 % (ref 36.0–46.0)
Hemoglobin: 11.8 g/dL — ABNORMAL LOW (ref 12.0–15.0)
Immature Granulocytes: 1 %
Lymphocytes Relative: 20 %
Lymphs Abs: 1.2 10*3/uL (ref 0.7–4.0)
MCH: 31.3 pg (ref 26.0–34.0)
MCHC: 32.7 g/dL (ref 30.0–36.0)
MCV: 95.8 fL (ref 80.0–100.0)
Monocytes Absolute: 0.9 10*3/uL (ref 0.1–1.0)
Monocytes Relative: 15 %
Neutro Abs: 3.8 10*3/uL (ref 1.7–7.7)
Neutrophils Relative %: 62 %
Platelets: 116 10*3/uL — ABNORMAL LOW (ref 150–400)
RBC: 3.77 MIL/uL — ABNORMAL LOW (ref 3.87–5.11)
RDW: 16.3 % — ABNORMAL HIGH (ref 11.5–15.5)
WBC: 6.1 10*3/uL (ref 4.0–10.5)
nRBC: 0 % (ref 0.0–0.2)

## 2018-12-28 LAB — COMPREHENSIVE METABOLIC PANEL
ALT: 32 U/L (ref 0–44)
AST: 34 U/L (ref 15–41)
Albumin: 3.3 g/dL — ABNORMAL LOW (ref 3.5–5.0)
Alkaline Phosphatase: 97 U/L (ref 38–126)
Anion gap: 10 (ref 5–15)
BUN: 34 mg/dL — ABNORMAL HIGH (ref 8–23)
CO2: 19 mmol/L — ABNORMAL LOW (ref 22–32)
Calcium: 9.2 mg/dL (ref 8.9–10.3)
Chloride: 102 mmol/L (ref 98–111)
Creatinine, Ser: 1.57 mg/dL — ABNORMAL HIGH (ref 0.44–1.00)
GFR calc Af Amer: 38 mL/min — ABNORMAL LOW (ref >60)
GFR calc non Af Amer: 33 mL/min — ABNORMAL LOW (ref >60)
Glucose, Bld: 188 mg/dL — ABNORMAL HIGH (ref 70–99)
Potassium: 5.5 mmol/L — ABNORMAL HIGH (ref 3.5–5.1)
Sodium: 131 mmol/L — ABNORMAL LOW (ref 135–145)
Total Bilirubin: 1.6 mg/dL — ABNORMAL HIGH (ref 0.3–1.2)
Total Protein: 6.4 g/dL — ABNORMAL LOW (ref 6.5–8.1)

## 2018-12-28 LAB — BLOOD GAS, VENOUS
Acid-base deficit: 4.7 mmol/L — ABNORMAL HIGH (ref 0.0–2.0)
Bicarbonate: 20 mmol/L (ref 20.0–28.0)
O2 Saturation: 72.6 %
Patient temperature: 98.6
pCO2, Ven: 37.9 mmHg — ABNORMAL LOW (ref 44.0–60.0)
pH, Ven: 7.343 (ref 7.250–7.430)
pO2, Ven: 43.9 mmHg (ref 32.0–45.0)

## 2018-12-28 LAB — AMMONIA: Ammonia: 63 umol/L — ABNORMAL HIGH (ref 9–35)

## 2018-12-28 LAB — PROTIME-INR
INR: 1.2 (ref 0.8–1.2)
Prothrombin Time: 15 seconds (ref 11.4–15.2)

## 2018-12-28 LAB — LACTIC ACID, PLASMA
Lactic Acid, Venous: 2.5 mmol/L (ref 0.5–1.9)
Lactic Acid, Venous: 1.8 mmol/L (ref 0.5–1.9)

## 2018-12-28 MED ORDER — SODIUM CHLORIDE 0.9 % IV SOLN
1.0000 g | INTRAVENOUS | Status: DC
  Administered 2018-12-29 – 2019-01-04 (×7): 1 g via INTRAVENOUS
  Filled 2018-12-28 (×2): qty 1
  Filled 2018-12-28 (×2): qty 10
  Filled 2018-12-28 (×5): qty 1

## 2018-12-28 MED ORDER — LACTULOSE 10 GM/15ML PO SOLN: 20.0000 g | Freq: Three times a day (TID) | ORAL | Status: DC | PRN

## 2018-12-28 MED ORDER — HEPARIN SODIUM (PORCINE) 5000 UNIT/ML IJ SOLN
5000.0000 [IU] | Freq: Three times a day (TID) | INTRAMUSCULAR | Status: DC
  Administered 2018-12-29 (×2): 5000 [IU] via SUBCUTANEOUS
  Filled 2018-12-28 (×2): qty 1

## 2018-12-28 MED ORDER — ACETAMINOPHEN 325 MG PO TABS
650.0000 mg | ORAL_TABLET | Freq: Four times a day (QID) | ORAL | Status: DC | PRN
  Administered 2018-12-30 (×2): 650 mg via ORAL
  Filled 2018-12-28 (×2): qty 2

## 2018-12-28 MED ORDER — LACTULOSE 10 GM/15ML PO SOLN
30.0000 g | Freq: Once | ORAL | Status: AC
  Administered 2018-12-28: 30 g via ORAL
  Filled 2018-12-28: qty 60

## 2018-12-28 MED ORDER — SODIUM CHLORIDE 0.9 % IV BOLUS
1000.0000 mL | Freq: Once | INTRAVENOUS | Status: AC
  Administered 2018-12-28: 1000 mL via INTRAVENOUS

## 2018-12-28 MED ORDER — INSULIN ASPART 100 UNIT/ML ~~LOC~~ SOLN
0.0000 [IU] | Freq: Three times a day (TID) | SUBCUTANEOUS | Status: DC
  Administered 2018-12-29: 5 [IU] via SUBCUTANEOUS
  Administered 2018-12-29: 2 [IU] via SUBCUTANEOUS
  Administered 2018-12-30: 5 [IU] via SUBCUTANEOUS
  Administered 2018-12-30: 12:00:00 2 [IU] via SUBCUTANEOUS
  Administered 2018-12-31 (×2): 3 [IU] via SUBCUTANEOUS
  Administered 2018-12-31: 5 [IU] via SUBCUTANEOUS
  Administered 2019-01-01: 3 [IU] via SUBCUTANEOUS
  Administered 2019-01-01: 2 [IU] via SUBCUTANEOUS
  Administered 2019-01-01: 5 [IU] via SUBCUTANEOUS
  Administered 2019-01-02: 7 [IU] via SUBCUTANEOUS
  Administered 2019-01-02: 9 [IU] via SUBCUTANEOUS
  Administered 2019-01-02: 2 [IU] via SUBCUTANEOUS
  Administered 2019-01-03 (×2): 1 [IU] via SUBCUTANEOUS
  Administered 2019-01-04: 5 [IU] via SUBCUTANEOUS
  Administered 2019-01-04 (×2): 7 [IU] via SUBCUTANEOUS
  Filled 2018-12-28: qty 0.09

## 2018-12-28 MED ORDER — SODIUM CHLORIDE 0.9 % IV SOLN
1.0000 g | Freq: Once | INTRAVENOUS | Status: AC
  Administered 2018-12-28: 1 g via INTRAVENOUS
  Filled 2018-12-28: qty 10

## 2018-12-28 MED ORDER — ACETAMINOPHEN 650 MG RE SUPP: 650.0000 mg | Freq: Four times a day (QID) | RECTAL | Status: DC | PRN

## 2018-12-28 MED ORDER — INSULIN ASPART 100 UNIT/ML ~~LOC~~ SOLN
0.0000 [IU] | Freq: Every day | SUBCUTANEOUS | Status: DC
  Administered 2018-12-30 – 2018-12-31 (×2): 2 [IU] via SUBCUTANEOUS
  Administered 2019-01-01: 4 [IU] via SUBCUTANEOUS
  Administered 2019-01-02: 3 [IU] via SUBCUTANEOUS
  Administered 2019-01-04: 5 [IU] via SUBCUTANEOUS
  Administered 2019-01-05: 2 [IU] via SUBCUTANEOUS
  Filled 2018-12-28: qty 0.05

## 2018-12-28 MED ORDER — SODIUM CHLORIDE 0.9 % IV SOLN
INTRAVENOUS | Status: AC
  Administered 2018-12-29 (×2): via INTRAVENOUS

## 2018-12-28 MED ORDER — RIFAXIMIN 550 MG PO TABS
550.0000 mg | ORAL_TABLET | Freq: Two times a day (BID) | ORAL | Status: DC
  Administered 2018-12-29 – 2019-01-06 (×16): 550 mg via ORAL
  Filled 2018-12-28 (×17): qty 1

## 2018-12-28 NOTE — H&P (Signed)
History and Physical    Brittany Russo NTZ:[id number] DOB: 02-02-1946 DOA: 12/28/2018  PCP: Rosalee Kaufman, PA-C Patient coming from: SNF  Chief Complaint: Altered mental status  HPI: Brittany Russo is a 73 y.o. female with medical history significant of liver cirrhosis secondary to NAFLD, insulin-dependent diabetes mellitus, depression, anxiety, hypertension, hyperlipidemia, seizure disorder, recent right knee extensor mechanism repair on 11/22/2018 presenting to the hospital from her SNF for evaluation of AMS.  Patient is confused and slow to respond to questions.  She is not sure why she is here.  Oriented to self and knows it is October 2020.  Not oriented to place.  Denies fevers, chest pain, shortness of breath, cough, nausea, vomiting, abdominal pain, or dysuria.  No additional history could be obtained from her.  ED Course: Bradycardic with heart rate in the 40s.  Not hypotensive.  Afebrile and no leukocytosis.  Lactic acid 2.5.  Ammonia level 63, elevated compared to prior labs.  Hemoglobin 11.8, above baseline.  Platelet count 116,000, has chronic thrombocytopenia and platelet count is currently improved from baseline.  Sodium 131, chronically low and at baseline.  Potassium 5.5.  Bicarb 19, anion gap 10.  Blood glucose 188.  BUN 34, creatinine 1.5.  Creatinine was 0.7 on 9/13.  T bili 1.6, remainder of LFTs normal.  INR 1.2.  UA with large amount of leukocytes, greater than 50 RBCs, greater than 50 WBCs, and many bacteria on microscopic examination.  Urine culture pending.  VBG with pH 7.34.  Head CT negative for acute finding. Patient received ceftriaxone, lactulose, and 1 L normal saline bolus.  Review of Systems:  All systems reviewed and apart from history of presenting illness, are negative.  Past Medical History:  Diagnosis Date   Acute and subacute hepatic failure without coma    Allergy    Anasarca    Anxiety    Arthritis    Ascites 09/2018   no SBP on 850 cc tap  10/13/18   Cirrhosis of liver ([place]) ~ 2004   from NAFLD.  Dx ~ 2004.  Dr Dorcas Mcmurray at [facility]/     Depression    Diabetes mellitus without complication [facility])    type 2   Eczema of both hands    Endometrial cancer ([place]) 1996, 2003   hysterectomy 1996.  recurrence 2003, treated with radiation.     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, DR Allyson Sabal Mir at [place] in [place]   Hyperlipidemia    Hypertension    Muscle weakness    Seizures ([place])    last seizure June 07, 2014    Thrombocytopenia [facility])    Thyroid nodule     Past Surgical History:  Procedure Laterality Date   CATARACT EXTRACTION W/PHACO Left 06/04/2017   Procedure: CATARACT EXTRACTION PHACO AND INTRAOCULAR LENS PLACEMENT (IOC);  Surgeon: Baruch Goldmann, MD;  Location: [facility];  Service: Ophthalmology;  Laterality: Left;  CDE: 3.90   CATARACT EXTRACTION W/PHACO Right 07/15/2017   Procedure: CATARACT EXTRACTION PHACO AND INTRAOCULAR LENS PLACEMENT (IOC);  Surgeon: Baruch Goldmann, MD;  Location: [facility];  Service: Ophthalmology;  Laterality: Right;  CDE: 7.60   COLONOSCOPY  06/2007   in [place]. for hematochzia.  radiation proctitis   DILATION AND CURETTAGE OF UTERUS     ESOPHAGOGASTRODUODENOSCOPY  2016   IR PARACENTESIS  10/13/2018   TOTAL ABDOMINAL HYSTERECTOMY  1996  TOTAL KNEE ARTHROPLASTY Right 09/22/2018   Procedure: TOTAL KNEE ARTHROPLASTY;  Surgeon: Paralee Cancel, MD;  Location: [facility];  Service: Orthopedics;  Laterality: Right;  70 mins   TOTAL KNEE REVISION Right 09/27/2018   Procedure: TOTAL KNEE REVISION;  Surgeon: Paralee Cancel, MD;  Location: [facility];  Service: Orthopedics;  Laterality: Right;   TOTAL KNEE REVISION Right 11/22/2018   Procedure: repair right knee extensor mechanism;  Surgeon: Paralee Cancel, MD;  Location: [facility];  Service: Orthopedics;  Laterality: Right;  90 mins     reports that she has never  smoked. She has never used smokeless tobacco. She reports that she does not drink alcohol or use drugs.  Allergies  Allergen Reactions   Asa [Aspirin] Other (See Comments)    Pt not allergic but cannot take due to bleeding    Other Other (See Comments)    Any jewelry metal-hives and itching     Family History  Problem Relation Age of Onset   Heart disease Mother    Diabetes Mother    Heart disease Father    Cancer Father    Cancer Maternal Grandmother    Heart disease Maternal Grandfather     Prior to Admission medications   Medication Sig Start Date End Date Taking? Authorizing Provider  ALPRAZolam (XANAX) 0.25 MG tablet Take 1 tablet (0.25 mg total) by mouth 2 (two) times daily. 12/05/18  Yes Babish, Rodman Key, PA-C  atenolol (TENORMIN) 50 MG tablet Take 50 mg by mouth daily.   Yes [provider]  atorvastatin (LIPITOR) 80 MG tablet Take 80 mg by mouth daily at 8 pm.   Yes [provider]  buPROPion (WELLBUTRIN XL) 150 MG 24 hr tablet Take 150 mg by mouth daily.   Yes [provider]  diltiazem (CARTIA XT) 180 MG 24 hr capsule Take 180 mg by mouth daily.   Yes [provider]  docusate sodium (COLACE) 100 MG capsule Take 1 capsule (100 mg total) by mouth 2 (two) times daily. 12/05/18  Yes Babish, Rodman Key, PA-C  DULoxetine (CYMBALTA) 60 MG capsule Take 60 mg by mouth daily.   Yes [provider]  fluticasone (FLONASE) 50 MCG/ACT nasal spray Place 1 spray into both nostrils daily.    Yes [provider]  gabapentin (NEURONTIN) 300 MG capsule Take 300 mg by mouth 2 (two) times daily.   Yes [provider]  glipiZIDE (GLUCOTROL) 5 MG tablet Take 5 mg by mouth 2 (two) times daily.   Yes [provider]  glucagon (GLUCAGON EMERGENCY) 1 MG injection Inject 1 mg into the muscle once as needed (blood sugar less than 60).   Yes [provider]  HYDROcodone-acetaminophen (NORCO) 5-325 MG tablet Take 1-2  tablets by mouth every 6 (six) hours as needed for moderate pain or severe pain. 12/05/18  Yes Babish, Rodman Key, PA-C  insulin glargine (LANTUS) 100 UNIT/ML injection Inject 65 Units into the skin daily at 6 (six) AM.   Yes [provider]  loratadine (CLARITIN) 10 MG tablet Take 10 mg by mouth daily.   Yes [provider]  meloxicam (MOBIC) 7.5 MG tablet Take 7.5 mg by mouth daily.   Yes [provider]  methocarbamol (ROBAXIN) 500 MG tablet Take 1 tablet (500 mg total) by mouth every 6 (six) hours as needed for muscle spasms. 12/05/18  Yes Babish, Rodman Key, PA-C  montelukast (SINGULAIR) 10 MG tablet Take 10 mg by mouth at bedtime.   Yes [provider]  Multiple  Vitamin (MULTIVITAMIN WITH MINERALS) TABS tablet Take 1 tablet by mouth daily. 12/06/18  Yes Babish, Rodman Key, PA-C  ondansetron (ZOFRAN) 4 MG tablet Take 1 tablet (4 mg total) by mouth every 6 (six) hours as needed for nausea. 09/30/18  Yes Griffith Citron R, PA-C  polyethylene glycol (MIRALAX / GLYCOLAX) 17 g packet Take 17 g by mouth 2 (two) times daily. 12/05/18  Yes Babish, Rodman Key, PA-C  potassium chloride (K-DUR) 10 MEQ tablet Take 10 mEq by mouth daily.   Yes [provider]  rifaximin (XIFAXAN) 550 MG TABS tablet Take 550 mg by mouth 2 (two) times daily.   Yes [provider]  sitaGLIPtin-metformin (JANUMET) 50-500 MG per tablet Take 1 tablet by mouth 2 (two) times daily with a meal.   Yes [provider]  spironolactone (ALDACTONE) 50 MG tablet Take 1 tablet (50 mg total) by mouth daily. 10/14/18 11/17/19 Yes Amin, Ankit Chirag, MD  Ibuprofen-Diphenhydramine Cit (ADVIL PM PO) Take 60 mg by mouth at bedtime.  05/21/17  [provider]    Physical Exam: Vitals:   12/28/18 2030 12/28/18 2130 12/28/18 2200 12/28/18 2230  BP: (!) 112/42 (!) 113/56 (!) 112/44 (!) 112/49  Pulse: (!) 50 (!) 50 (!) 46 (!) 52  Resp: 17 17 17 18   Temp:      TempSrc:      SpO2: 97% 97% 98%  97%    Physical Exam  Constitutional: She appears well-developed and well-nourished. No distress.  HENT:  Head: Normocephalic.  Very dry mucous membranes  Eyes: EOM are normal. Right eye exhibits no discharge. Left eye exhibits no discharge.  Neck: Neck supple.  Cardiovascular: Normal rate, regular rhythm and intact distal pulses.  Pulmonary/Chest: Effort normal and breath sounds normal. No respiratory distress. She has no wheezes. She has no rales.  Abdominal: Soft. Bowel sounds are normal. She exhibits no distension. There is no abdominal tenderness. There is no guarding.  Musculoskeletal:        General: No edema.     Comments: Right knee brace in place  Neurological:  Awake and alert Appears confused and slow to respond to questions Oriented to self and knows it is October 2020 Not oriented to place Moving all extremities on command.  No focal weakness.  Skin: Skin is warm and dry. She is not diaphoretic.     Labs on Admission: I have personally reviewed following labs and imaging studies  CBC: Recent Labs  Lab 12/28/18 1812  WBC 6.1  NEUTROABS 3.8  HGB 11.8*  HCT 36.1  MCV 95.8  PLT 938*   Basic Metabolic Panel: Recent Labs  Lab 12/28/18 1812  NA 131*  K 5.5*  CL 102  CO2 19*  GLUCOSE 188*  BUN 34*  CREATININE 1.57*  CALCIUM 9.2   GFR: CrCl cannot be calculated (Unknown ideal weight.). Liver Function Tests: Recent Labs  Lab 12/28/18 1812  AST 34  ALT 32  ALKPHOS 97  BILITOT 1.6*  PROT 6.4*  ALBUMIN 3.3*   No results for input(s): LIPASE, AMYLASE in the last 168 hours. Recent Labs  Lab 12/28/18 1813  AMMONIA 63*   Coagulation Profile: Recent Labs  Lab 12/28/18 1812  INR 1.2   Cardiac Enzymes: No results for input(s): CKTOTAL, CKMB, CKMBINDEX, TROPONINI in the last 168 hours. BNP (last 3 results) No results for input(s): PROBNP in the last 8760 hours. HbA1C: No results for input(s): HGBA1C in the last 72 hours. CBG: No results for  input(s): GLUCAP in the  last 168 hours. Lipid Profile: No results for input(s): CHOL, HDL, LDLCALC, TRIG, CHOLHDL, LDLDIRECT in the last 72 hours. Thyroid Function Tests: No results for input(s): TSH, T4TOTAL, FREET4, T3FREE, THYROIDAB in the last 72 hours. Anemia Panel: No results for input(s): VITAMINB12, FOLATE, FERRITIN, TIBC, IRON, RETICCTPCT in the last 72 hours. Urine analysis:    Component Value Date/Time   COLORURINE BROWN (A) 12/28/2018 1743   APPEARANCEUR CLOUDY (A) 12/28/2018 1743   LABSPEC 1.016 12/28/2018 1743   PHURINE 5.0 12/28/2018 1743   GLUCOSEU NEGATIVE 12/28/2018 1743   HGBUR LARGE (A) 12/28/2018 1743   BILIRUBINUR NEGATIVE 12/28/2018 1743   BILIRUBINUR NEGATIVE 03/06/2013 1100   KETONESUR NEGATIVE 12/28/2018 1743   PROTEINUR 100 (A) 12/28/2018 1743   UROBILINOGEN negative 03/06/2013 1100   NITRITE NEGATIVE 12/28/2018 1743   LEUKOCYTESUR MODERATE (A) 12/28/2018 1743    Radiological Exams on Admission: Ct Head Wo Contrast  Result Date: 12/28/2018 CLINICAL DATA:  Confusion.  Hallucinations. EXAM: CT HEAD WITHOUT CONTRAST TECHNIQUE: Contiguous axial images were obtained from the base of the skull through the vertex without intravenous contrast. COMPARISON:  Head CT 09/25/2018 FINDINGS: Brain: No acute intracranial hemorrhage. No focal mass lesion. No CT evidence of acute infarction. No midline shift or mass effect. No hydrocephalus. Basilar cisterns are patent. There are minimal periventricular and subcortical white matter hypodensities. Minimal generalized cortical atrophy. Vascular: No hyperdense vessel or unexpected calcification. Skull: Normal. Negative for fracture or focal lesion. Sinuses/Orbits: Small volume fluid in the maxillary sinus. Mastoid air cells are clear. Orbits are clear. Other: None. IMPRESSION: 1. No acute intracranial findings. 2. Minimal cerebral atrophy and white matter microvascular disease. 3. Chronic maxillary sinusitis. Electronically Signed    By: Suzy Bouchard M.D.   On: 12/28/2018 18:25    EKG: Independently reviewed.  Bradycardia, heart rate 49.  PR prolongation.  Assessment/Plan Principal Problem:   Hepatic encephalopathy (HCC) Active Problems:   AKI (acute kidney injury) ([place])   UTI (urinary tract infection)   Bradycardia   Hyperkalemia   Acute hepatic encephalopathy History of liver cirrhosis secondary to NAFLD Patient is confused and slow to respond to questions.  Oriented to self and knows it is October 2020.  Not oriented to place and does not know why she is here.  Ammonia level 63, elevated compared to prior labs.  No lactulose listed in home medications.  Only rifaximin is listed.  Stroke less likely as head CT negative and neuro exam nonfocal.  Does have a UTI which is also likely contributing to the patient's encephalopathy.  In addition, polypharmacy could also be contributing as Xanax, Norco, and Robaxin listed in home medications. -Lactulose -Continue rifaximin -Repeat ammonia level in a.m. -Treatment of UTI as mentioned below -Hold sedating medications -Continue to monitor mental status  UTI Afebrile no leukocytosis.  Lactic acid mildly elevated, dehydration could be contributing.  UA with large amount of leukocytes, greater than 50 RBCs, greater than 50 WBCs, and many bacteria on microscopic examination. -Continue ceftriaxone -Urine culture pending  AKI BUN 34, creatinine 1.5.  Creatinine was 0.7 on 9/13.  Likely prerenal secondary to dehydration and home diuretic use. -IV fluid hydration -Continue to monitor renal function -Monitor urine output -Hold diuretics  Bradycardia, PR prolongation Bradycardic with heart rate in the 40s.  Not hypotensive.  Atenolol and Cardizem listed in home medications. -Cardiac monitoring -Hold atenolol and Cardizem -Check TSH and free T4 levels  Mild lactic acidosis Lactic acid 2.5.  Suspect related to dehydration.  Does have  a UTI but no fever or  leukocytosis. -Received 1 L fluid bolus in the ED.  Continue IV fluid hydration. -Continue to trend lactate  Mild hyperkalemia Potassium 5.5.  Likely related to AKI and home medications (spironolactone and potassium supplement). -Cardiac monitoring -Hold spironolactone and potassium supplement -Continue to monitor potassium level  Normal anion gap metabolic acidosis Bicarb 19, anion gap 10.  Likely related to home diuretic use and AKI. -Hold diuretic -IV fluid hydration -Continue to monitor  Elevated T bili T bili 1.6.  Does have a history of cirrhosis but transaminases are normal. -Check fractionated bilirubin level  Insulin-dependent diabetes mellitus -Check A1c level.  Sliding scale insulin sensitive and CBG checks.  Recent knee surgery Patient underwent right knee extensor mechanism repair on 11/22/2018.  Has a knee brace in place. -PT evaluation -Outpatient orthopedic follow-up  Pharmacy med rec pending.  DVT prophylaxis: Subcutaneous heparin Code Status: Full code Family Communication: No family available. Disposition Plan: Anticipate discharge after clinical improvement. Consults called: None Admission status: It is my clinical opinion that admission to INPATIENT is reasonable and necessary in this 73 y.o. female  presenting with acute hepatic encephalopathy, UTI, AKI, hyperkalemia, and bradycardia.  Given the aforementioned, the predictability of an adverse outcome is felt to be significant. I expect that the patient will require at least 2 midnights in the hospital to treat this condition.   The medical decision making on this patient was of high complexity and the patient is at high risk for clinical deterioration, therefore this is a level 3 visit.  Shela Leff MD [facility] Pager [phone]  If 7PM-7AM, please contact night-coverage [email or web address] Password [email or web address]  12/28/2018, 11:17 PM

## 2018-12-28 NOTE — ED Provider Notes (Signed)
[facility] DEPT Provider Note   CSN: [id number] Arrival date & time: 12/28/18  1632     History   Chief Complaint Chief Complaint  Patient presents with  . Altered Mental Status    HPI Brittany Russo is a 73 y.o. female.     HPI   Patient sent here, by EMS, from a rehab facility to be evaluated for stroke versus an hepatic encephalopathy.  Patient apparently had knee surgery several weeks ago and required rehab.  She reportedly has a history of hepatic encephalopathy.  She is unable to give any history at this time.  Level 5 caveat-altered mental status  Past Medical History:  Diagnosis Date  . Acute and subacute hepatic failure without coma   . Allergy   . Anasarca   . Anxiety   . Arthritis   . Ascites 09/2018   no SBP on 850 cc tap 10/13/18  . Cirrhosis of liver ([name]) ~ 2004   from NAFLD.  Dx ~ 2004.  Dr Dorcas Mcmurray at [facility]/    . Depression   . Diabetes mellitus without complication ([place])    type 2  . Eczema of both hands   . Endometrial cancer ([place]) 1996, 2003   hysterectomy 1996.  recurrence 2003, treated with radiation.    . Generalized edema   . GI hemorrhage   . Headache   . 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 in [place]  . Hyperlipidemia   . Hypertension   . Muscle weakness   . Seizures ([employer])    last seizure June 07, 2014   . Thrombocytopenia ([place])   . Thyroid nodule     Patient Active Problem List   Diagnosis Date Noted  . Hematuria 11/23/2018  . Overweight (BMI 25.0-29.9) 11/23/2018  . Mechanical problem with extremity 11/22/2018  . Cirrhosis of liver with ascites ([place]) 11/09/2018  . Nausea without vomiting 10/26/2018  . Ascites   . Rectal bleeding   . AKI (acute kidney injury) ([place]) 10/11/2018  . Lower GI bleed 10/10/2018  . Anasarca 10/10/2018  . Hypoalbuminemia 10/10/2018  . Depression 09/28/2018  . Acute hepatic encephalopathy 09/28/2018   . Cirrhosis ([place]) 09/28/2018  . Periprosthetic fracture around internal prosthetic right knee joint 09/24/2018  . S/P right TKA 09/22/2018  . Status post total knee replacement, right 09/22/2018  . DM2 (diabetes mellitus, type 2) ([place]) 07/11/2015  . Hypertension 07/11/2015  . Hyperlipidemia 07/11/2015    Past Surgical History:  Procedure Laterality Date  . CATARACT EXTRACTION W/PHACO Left 06/04/2017   Procedure: CATARACT EXTRACTION PHACO AND INTRAOCULAR LENS PLACEMENT (IOC);  Surgeon: Baruch Goldmann, MD;  Location: [facility];  Service: Ophthalmology;  Laterality: Left;  CDE: 3.90  . CATARACT EXTRACTION W/PHACO Right 07/15/2017   Procedure: CATARACT EXTRACTION PHACO AND INTRAOCULAR LENS PLACEMENT (IOC);  Surgeon: Baruch Goldmann, MD;  Location: [facility];  Service: Ophthalmology;  Laterality: Right;  CDE: 7.60  . COLONOSCOPY  06/2007   in [place]. for hematochzia.  radiation proctitis  . DILATION AND CURETTAGE OF UTERUS    . ESOPHAGOGASTRODUODENOSCOPY  2016  . IR PARACENTESIS  10/13/2018  . TOTAL ABDOMINAL HYSTERECTOMY  1996  . TOTAL KNEE ARTHROPLASTY Right 09/22/2018   Procedure: TOTAL KNEE ARTHROPLASTY;  Surgeon: Paralee Cancel, MD;  Location: [facility];  Service: Orthopedics;  Laterality: Right;  70 mins  . TOTAL KNEE REVISION Right 09/27/2018   Procedure: TOTAL KNEE REVISION;  Surgeon: Paralee Cancel, MD;  Location: [facility];  Service: Orthopedics;  Laterality: Right;  . TOTAL KNEE REVISION Right 11/22/2018   Procedure: repair right knee extensor mechanism;  Surgeon: Paralee Cancel, MD;  Location: [facility];  Service: Orthopedics;  Laterality: Right;  90 mins     OB History   No obstetric history on file.      Home Medications    Prior to Admission medications   Medication Sig Start Date End Date Taking? Authorizing Provider  ALPRAZolam (XANAX) 0.25 MG tablet Take 1 tablet (0.25 mg total) by mouth 2 (two) times daily. 12/05/18  Yes Babish, Rodman Key, PA-C  atenolol (TENORMIN) 50 MG tablet Take 50 mg by  mouth daily.   Yes [provider]  atorvastatin (LIPITOR) 80 MG tablet Take 80 mg by mouth daily at 8 pm.   Yes [provider]  buPROPion (WELLBUTRIN XL) 150 MG 24 hr tablet Take 150 mg by mouth daily.   Yes [provider]  diltiazem (CARTIA XT) 180 MG 24 hr capsule Take 180 mg by mouth daily.   Yes [provider]  docusate sodium (COLACE) 100 MG capsule Take 1 capsule (100 mg total) by mouth 2 (two) times daily. 12/05/18  Yes Babish, Rodman Key, PA-C  DULoxetine (CYMBALTA) 60 MG capsule Take 60 mg by mouth daily.   Yes [provider]  fluticasone (FLONASE) 50 MCG/ACT nasal spray Place 1 spray into both nostrils daily.    Yes [provider]  gabapentin (NEURONTIN) 300 MG capsule Take 300 mg by mouth 2 (two) times daily.   Yes [provider]  glipiZIDE (GLUCOTROL) 5 MG tablet Take 5 mg by mouth 2 (two) times daily.   Yes [provider]  glucagon (GLUCAGON EMERGENCY) 1 MG injection Inject 1 mg into the muscle once as needed (blood sugar less than 60).   Yes [provider]  HYDROcodone-acetaminophen (NORCO) 5-325 MG tablet Take 1-2 tablets by mouth every 6 (six) hours as needed for moderate pain or severe pain. 12/05/18  Yes Babish, Rodman Key, PA-C  insulin glargine (LANTUS) 100 UNIT/ML injection Inject 65 Units into the skin daily at 6 (six) AM.   Yes [provider]  loratadine (CLARITIN) 10 MG tablet Take 10 mg by mouth daily.   Yes [provider]  meloxicam (MOBIC) 7.5 MG tablet Take 7.5 mg by mouth daily.   Yes [provider]  methocarbamol (ROBAXIN) 500 MG tablet Take 1 tablet (500 mg total) by mouth every 6 (six) hours as needed for muscle spasms. 12/05/18  Yes Babish, Rodman Key, PA-C  montelukast (SINGULAIR) 10 MG tablet Take 10 mg by mouth at bedtime.   Yes [provider]  Multiple Vitamin (MULTIVITAMIN WITH MINERALS) TABS tablet Take 1 tablet by mouth daily. 12/06/18  Yes  Babish, Rodman Key, PA-C  ondansetron (ZOFRAN) 4 MG tablet Take 1 tablet (4 mg total) by mouth every 6 (six) hours as needed for nausea. 09/30/18  Yes Griffith Citron R, PA-C  polyethylene glycol (MIRALAX / GLYCOLAX) 17 g packet Take 17 g by mouth 2 (two) times daily. 12/05/18  Yes Babish, Rodman Key, PA-C  potassium chloride (K-DUR) 10 MEQ tablet Take 10 mEq by mouth daily.   Yes [provider]  rifaximin (XIFAXAN) 550 MG TABS tablet Take 550 mg by mouth 2 (two) times daily.   Yes [provider]  sitaGLIPtin-metformin (JANUMET) 50-500 MG per tablet Take 1 tablet by mouth 2 (two) times daily with a meal.   Yes [provider]  spironolactone (ALDACTONE) 50  MG tablet Take 1 tablet (50 mg total) by mouth daily. 10/14/18 11/17/19 Yes Amin, Ankit Chirag, MD  Ibuprofen-Diphenhydramine Cit (ADVIL PM PO) Take 60 mg by mouth at bedtime.  05/21/17  [provider]    Family History Family History  Problem Relation Age of Onset  . Heart disease Mother   . Diabetes Mother   . Heart disease Father   . Cancer Father   . Cancer Maternal Grandmother   . Heart disease Maternal Grandfather     Social History Social History   Tobacco Use  . Smoking status: Never Smoker  . Smokeless tobacco: Never Used  Substance Use Topics  . Alcohol use: No  . Drug use: No     Allergies   Asa [aspirin] and Other   Review of Systems Review of Systems  Unable to perform ROS: Mental status change     Physical Exam Updated Vital Signs BP (!) 113/56   Pulse (!) 50   Temp 98.9 F (37.2 C) (Rectal)   Resp 17   SpO2 97%   Physical Exam Vitals signs and nursing note reviewed.  Constitutional:      General: She is not in acute distress.    Appearance: She is well-developed. She is not ill-appearing, toxic-appearing or diaphoretic.  HENT:     Head: Normocephalic and atraumatic.     Right Ear: External ear normal.     Left Ear: External ear normal.  Eyes:      Conjunctiva/sclera: Conjunctivae normal.     Pupils: Pupils are equal, round, and reactive to light.  Neck:     Musculoskeletal: Normal range of motion and neck supple.     Trachea: Phonation normal.  Cardiovascular:     Rate and Rhythm: Normal rate and regular rhythm.     Heart sounds: Normal heart sounds.  Pulmonary:     Effort: Pulmonary effort is normal.     Breath sounds: Normal breath sounds.  Abdominal:     Palpations: Abdomen is soft.     Tenderness: There is no abdominal tenderness.  Musculoskeletal: Normal range of motion.  Skin:    General: Skin is warm and dry.  Neurological:     Mental Status: She is alert.     Cranial Nerves: No cranial nerve deficit.     Sensory: No sensory deficit.     Motor: No abnormal muscle tone.     Coordination: Coordination normal.  Psychiatric:        Mood and Affect: Mood normal.        Behavior: Behavior normal.     Comments: Sleepy      ED Treatments / Results  Labs (all labs ordered are listed, but only abnormal results are displayed) Labs Reviewed  COMPREHENSIVE METABOLIC PANEL - Abnormal; Notable for the following components:      Result Value   Sodium 131 (*)    Potassium 5.5 (*)    CO2 19 (*)    Glucose, Bld 188 (*)    BUN 34 (*)    Creatinine, Ser 1.57 (*)    Total Protein 6.4 (*)    Albumin 3.3 (*)    Total Bilirubin 1.6 (*)    GFR calc non Af Amer 33 (*)    GFR calc Af Amer 38 (*)    All other components within normal limits  LACTIC ACID, PLASMA - Abnormal; Notable for the following components:   Lactic Acid, Venous 2.5 (*)    All other components within normal  limits  CBC WITH DIFFERENTIAL/PLATELET - Abnormal; Notable for the following components:   RBC 3.77 (*)    Hemoglobin 11.8 (*)    RDW 16.3 (*)    Platelets 116 (*)    All other components within normal limits  URINALYSIS, ROUTINE W REFLEX MICROSCOPIC - Abnormal; Notable for the following components:   Color, Urine BROWN (*)    APPearance CLOUDY (*)     Hgb urine dipstick LARGE (*)    Protein, ur 100 (*)    Leukocytes,Ua MODERATE (*)    RBC / HPF >50 (*)    WBC, UA >50 (*)    Bacteria, UA MANY (*)    All other components within normal limits  AMMONIA - Abnormal; Notable for the following components:   Ammonia 63 (*)    All other components within normal limits  BLOOD GAS, VENOUS - Abnormal; Notable for the following components:   pCO2, Ven 37.9 (*)    Acid-base deficit 4.7 (*)    All other components within normal limits  URINE CULTURE  PROTIME-INR  I-STAT VENOUS BLOOD GAS, ED    EKG None  Radiology Ct Head Wo Contrast  Result Date: 12/28/2018 CLINICAL DATA:  Confusion.  Hallucinations. EXAM: CT HEAD WITHOUT CONTRAST TECHNIQUE: Contiguous axial images were obtained from the base of the skull through the vertex without intravenous contrast. COMPARISON:  Head CT 09/25/2018 FINDINGS: Brain: No acute intracranial hemorrhage. No focal mass lesion. No CT evidence of acute infarction. No midline shift or mass effect. No hydrocephalus. Basilar cisterns are patent. There are minimal periventricular and subcortical white matter hypodensities. Minimal generalized cortical atrophy. Vascular: No hyperdense vessel or unexpected calcification. Skull: Normal. Negative for fracture or focal lesion. Sinuses/Orbits: Small volume fluid in the maxillary sinus. Mastoid air cells are clear. Orbits are clear. Other: None. IMPRESSION: 1. No acute intracranial findings. 2. Minimal cerebral atrophy and white matter microvascular disease. 3. Chronic maxillary sinusitis. Electronically Signed   By: Suzy Bouchard M.D.   On: 12/28/2018 18:25    Procedures Procedures (including critical care time)  Medications Ordered in ED Medications  cefTRIAXone (ROCEPHIN) 1 g in sodium chloride 0.9 % 100 mL IVPB (has no administration in time range)  sodium chloride 0.9 % bolus 1,000 mL (has no administration in time range)  lactulose (CHRONULAC) 10 GM/15ML solution 30  g (30 g Oral Given 12/28/18 2003)     Initial Impression / Assessment and Plan / ED Course  I have reviewed the triage vital signs and the nursing notes.  Pertinent labs & imaging results that were available during my care of the patient were reviewed by me and considered in my medical decision making (see chart for details).  Clinical Course as of Dec 27 2199  Wed Dec 28, 2018  2149 High  Lactic acid, plasma(!!) [EW]  2149 Abnormal, high  Ammonia(!) [EW]  2149 Normal except hemoglobin low 11.8, platelets low  CBC with Differential(!) [EW]  2149 Normal except PCO2 low, acid-base high  Blood gas, venous (at [facility] and [facility], not at [facility])(!) [EW]  2149 Normal except sodium low, potassium high, CO2 low, glucose high, BUN high, creatinine high, total protein low, albumin low, total bilirubin high, GFR low  Comprehensive metabolic panel(!) [EW]  [id number] Normal  Protime-INR [EW]  2150 Normal except presence of blood, protein, leukocytes, RBCs and WBCs, and bacteria.  Culture ordered  Urinalysis, Routine w reflex microscopic(!) [EW]  2150 No acute abnormality, chronic sinusitis, images reviewed by me  CT Head  Wo Contrast [EW]    Clinical Course User Index [EW] Daleen Bo, MD       Patient Vitals for the past 24 hrs:  BP Temp Temp src Pulse Resp SpO2  12/28/18 2130 (!) 113/56 - - (!) 50 17 97 %  12/28/18 2030 (!) 112/42 - - (!) 50 17 97 %  12/28/18 2004 113/62 - - (!) 48 18 100 %  12/28/18 1930 (!) 108/47 - - (!) 50 16 99 %  12/28/18 1900 (!) 111/48 - - 70 10 100 %  12/28/18 1830 (!) 106/47 - - 69 (!) 9 100 %  12/28/18 1800 (!) 109/48 - - (!) 48 11 100 %  12/28/18 1735 (!) 130/57 98.9 F (37.2 C) Rectal (!) 49 17 99 %  12/28/18 1651 (!) 109/48 98 F (36.7 C) Oral (!) 52 18 100 %    10:01 PM Reevaluation with update and discussion. After initial assessment and treatment, an updated evaluation reveals no change in status. Daleen Bo   Medical Decision Making: Altered mental  status, with no acute head CT.  Ammonia elevated from prior, indicating  hepatic encephalopathy.  Etiology, unclear.  Lactulose ordered.  UTI appears to be present despite current treatment.  IV Rocephin ordered.  Abnormal chemistry panel with sodium low, potassium high, CO2 low.  Creatinine is also high and BUN high consistent with volume depletion.  These are abnormally higher than baseline.  Mild elevation total bilirubin.  No transaminase elevation.  Glucose elevated without ketosis.  CRITICAL CARE-yes Performed by: Daleen Bo  Nursing Notes Reviewed/ Care Coordinated Applicable Imaging Reviewed Interpretation of Laboratory Data incorporated into ED treatment   9:56 PM-Consult complete with hospitalist. Patient case explained and discussed.  She agrees to admit patient for further evaluation and treatment. Call ended at 10:30 PM  Plan: Admit  Final Clinical Impressions(s) / ED Diagnoses   Final diagnoses:  Hepatic encephalopathy ([place])  Urinary tract infection without hematuria, site unspecified  AKI (acute kidney injury) ([place])  Hyperglycemia  Hyperkalemia    ED Discharge Orders    None       Daleen Bo, MD 12/28/18 2234

## 2018-12-28 NOTE — ED Notes (Signed)
In & Out cath performed, brief changed, peri care performed. Purewick placed on pt.

## 2018-12-28 NOTE — ED Triage Notes (Addendum)
Per daughter-states she has been at [facility] for rehab r/t knee surgery-states family has noticed she has had increased confusion, and not acting right-not able to touch her nose or feed herself-states she is hallucinating-Dr Ricard Dillon wanted her to come to ED to be worked up for possible stroke-states her ammonia level might be elevated-she is currently being treated for a UTI-please contact daughter for additional information

## 2018-12-28 NOTE — ED Notes (Signed)
Patient transported to CT 

## 2018-12-28 NOTE — ED Notes (Signed)
Date and time results received: 12/28/18 1911 (use smartphrase ".now" to insert current time)  Test: Lactic Acid Critical Value: 2.5  Name of Provider Notified: Eulis Foster, MD  Orders Received? Or Actions Taken?: Orders Received - See Orders for details

## 2018-12-28 NOTE — ED Notes (Signed)
Lab has been called about RN sending down [facility].

## 2018-12-29 ENCOUNTER — Encounter (HOSPITAL_COMMUNITY): Payer: Self-pay | Admitting: Gastroenterology

## 2018-12-29 DIAGNOSIS — K921 Melena: Secondary | ICD-10-CM

## 2018-12-29 LAB — HEMOGLOBIN A1C
Hgb A1c MFr Bld: 6.3 % — ABNORMAL HIGH (ref 4.8–5.6)
Mean Plasma Glucose: 134.11 mg/dL

## 2018-12-29 LAB — GLUCOSE, CAPILLARY
Glucose-Capillary: 61 mg/dL — ABNORMAL LOW (ref 70–99)
Glucose-Capillary: 76 mg/dL (ref 70–99)
Glucose-Capillary: 176 mg/dL — ABNORMAL HIGH (ref 70–99)
Glucose-Capillary: 280 mg/dL — ABNORMAL HIGH (ref 70–99)
Glucose-Capillary: 190 mg/dL — ABNORMAL HIGH (ref 70–99)
Glucose-Capillary: 142 mg/dL — ABNORMAL HIGH (ref 70–99)

## 2018-12-29 LAB — CBC WITH DIFFERENTIAL/PLATELET
Abs Immature Granulocytes: 0.02 10*3/uL (ref 0.00–0.07)
Basophils Absolute: 0.1 10*3/uL (ref 0.0–0.1)
Basophils Relative: 1 %
Eosinophils Absolute: 0.1 10*3/uL (ref 0.0–0.5)
Eosinophils Relative: 2 %
HCT: 35.9 % — ABNORMAL LOW (ref 36.0–46.0)
Hemoglobin: 11.4 g/dL — ABNORMAL LOW (ref 12.0–15.0)
Immature Granulocytes: 0 %
Lymphocytes Relative: 24 %
Lymphs Abs: 1.1 10*3/uL (ref 0.7–4.0)
MCH: 30.7 pg (ref 26.0–34.0)
MCHC: 31.8 g/dL (ref 30.0–36.0)
MCV: 96.8 fL (ref 80.0–100.0)
Monocytes Absolute: 0.7 10*3/uL (ref 0.1–1.0)
Monocytes Relative: 16 %
Neutro Abs: 2.6 10*3/uL (ref 1.7–7.7)
Neutrophils Relative %: 57 %
Platelets: 68 10*3/uL — ABNORMAL LOW (ref 150–400)
RBC: 3.71 MIL/uL — ABNORMAL LOW (ref 3.87–5.11)
RDW: 16.6 % — ABNORMAL HIGH (ref 11.5–15.5)
WBC: 4.6 10*3/uL (ref 4.0–10.5)
nRBC: 0 % (ref 0.0–0.2)

## 2018-12-29 LAB — BASIC METABOLIC PANEL
Anion gap: 12 (ref 5–15)
BUN: 34 mg/dL — ABNORMAL HIGH (ref 8–23)
CO2: 19 mmol/L — ABNORMAL LOW (ref 22–32)
Calcium: 8.9 mg/dL (ref 8.9–10.3)
Chloride: 105 mmol/L (ref 98–111)
Creatinine, Ser: 1.32 mg/dL — ABNORMAL HIGH (ref 0.44–1.00)
GFR calc Af Amer: 47 mL/min — ABNORMAL LOW (ref >60)
GFR calc non Af Amer: 41 mL/min — ABNORMAL LOW (ref >60)
Glucose, Bld: 103 mg/dL — ABNORMAL HIGH (ref 70–99)
Potassium: 5 mmol/L (ref 3.5–5.1)
Sodium: 136 mmol/L (ref 135–145)

## 2018-12-29 LAB — BILIRUBIN, FRACTIONATED(TOT/DIR/INDIR)
Bilirubin, Direct: 0.4 mg/dL — ABNORMAL HIGH (ref 0.0–0.2)
Indirect Bilirubin: 1 mg/dL — ABNORMAL HIGH (ref 0.3–0.9)
Total Bilirubin: 1.4 mg/dL — ABNORMAL HIGH (ref 0.3–1.2)

## 2018-12-29 LAB — PHOSPHORUS: Phosphorus: 5.1 mg/dL — ABNORMAL HIGH (ref 2.5–4.6)

## 2018-12-29 LAB — AMMONIA: Ammonia: 57 umol/L — ABNORMAL HIGH (ref 9–35)

## 2018-12-29 LAB — MAGNESIUM: Magnesium: 2 mg/dL (ref 1.7–2.4)

## 2018-12-29 LAB — CBG MONITORING, ED: Glucose-Capillary: 123 mg/dL — ABNORMAL HIGH (ref 70–99)

## 2018-12-29 LAB — MRSA PCR SCREENING: MRSA by PCR: NEGATIVE

## 2018-12-29 LAB — TSH: TSH: 3.562 u[IU]/mL (ref 0.350–4.500)

## 2018-12-29 LAB — SARS CORONAVIRUS 2 (TAT 6-24 HRS): SARS Coronavirus 2: NEGATIVE

## 2018-12-29 LAB — T4, FREE: Free T4: 1.18 ng/dL — ABNORMAL HIGH (ref 0.61–1.12)

## 2018-12-29 MED ORDER — INFLUENZA VAC A&B SA ADJ QUAD 0.5 ML IM PRSY
0.5000 mL | PREFILLED_SYRINGE | INTRAMUSCULAR | Status: AC
  Administered 2018-12-30: 0.5 mL via INTRAMUSCULAR
  Filled 2018-12-29: qty 0.5

## 2018-12-29 MED ORDER — SODIUM CHLORIDE 0.9 % IV SOLN
INTRAVENOUS | Status: DC
  Administered 2018-12-29 – 2018-12-30 (×2): via INTRAVENOUS

## 2018-12-29 NOTE — Consult Note (Signed)
Urology Consult  Referring physician: Dr. Alfredia Ferguson Reason for referral: Gross Hematuria  Chief Complaint: Gross hematuria  History of Present Illness: Ms Strength is a 73yo with a history of cirrhosis, thombocytopenia and uterine cancer who was admitted with hepatic encephalopathy. Since admission she is having intermittent gross hematuria with occasional dysuria. No other associates LUTS. She noted worsening gross hematuria over the past 5-6 weeks. She states she has been treated at least twice for a UTI in the past 5-6 weeks. She denies any previous episodes of gross hematuria but she did see Dr. Louis Meckel at [place] [facility] in 2017 for hemorrhagic cystitis. She underwent hysterectomy in 1996 and was found to have uterine cancer and was subsequently treated with radiation. She underwent recent CT imaging which showed normal kidneys, ureters and bladder. No other associated symptoms. No exacerbating alleviating events.    Past Medical History:  Diagnosis Date  . Acute and subacute hepatic failure without coma   . Allergy   . Anasarca   . Anxiety   . Arthritis   . Ascites 09/2018   no SBP on 850 cc tap 10/13/18  . Cirrhosis of liver ([place]) ~ 2004   from NAFLD.  Dx ~ 2004.  Dr Dorcas Mcmurray at [facility]/    . Depression   . Diabetes mellitus without complication ([place])    type 2  . Eczema of both hands   . Endometrial cancer ([place]) 1996, 2003   hysterectomy 1996.  recurrence 2003, treated with radiation.    . Generalized edema   . GI hemorrhage   . Headache   . 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 in [place]  . Hyperlipidemia   . Hypertension   . Muscle weakness   . Seizures ([place])    last seizure June 07, 2014   . Thrombocytopenia ([place])   . Thyroid nodule    Past Surgical History:  Procedure Laterality Date  . CATARACT EXTRACTION W/PHACO Left 06/04/2017   Procedure: CATARACT EXTRACTION PHACO AND INTRAOCULAR LENS PLACEMENT (IOC);  Surgeon:  Baruch Goldmann, MD;  Location: [facility];  Service: Ophthalmology;  Laterality: Left;  CDE: 3.90  . CATARACT EXTRACTION W/PHACO Right 07/15/2017   Procedure: CATARACT EXTRACTION PHACO AND INTRAOCULAR LENS PLACEMENT (IOC);  Surgeon: Baruch Goldmann, MD;  Location: [facility];  Service: Ophthalmology;  Laterality: Right;  CDE: 7.60  . COLONOSCOPY  06/2007   in [place]. for hematochzia.  radiation proctitis  . DILATION AND CURETTAGE OF UTERUS    . ESOPHAGOGASTRODUODENOSCOPY  2016  . IR PARACENTESIS  10/13/2018  . TOTAL ABDOMINAL HYSTERECTOMY  1996  . TOTAL KNEE ARTHROPLASTY Right 09/22/2018   Procedure: TOTAL KNEE ARTHROPLASTY;  Surgeon: Paralee Cancel, MD;  Location: [facility];  Service: Orthopedics;  Laterality: Right;  70 mins  . TOTAL KNEE REVISION Right 09/27/2018   Procedure: TOTAL KNEE REVISION;  Surgeon: Paralee Cancel, MD;  Location: [facility];  Service: Orthopedics;  Laterality: Right;  . TOTAL KNEE REVISION Right 11/22/2018   Procedure: repair right knee extensor mechanism;  Surgeon: Paralee Cancel, MD;  Location: [facility];  Service: Orthopedics;  Laterality: Right;  90 mins    Medications: I have reviewed the patient's current medications. Allergies:  Allergies  Allergen Reactions  . Asa [Aspirin] Other (See Comments)    Pt not allergic but cannot take due to bleeding   . Other Other (See Comments)    Any jewelry metal-hives and itching     Family History  Problem  Relation Age of Onset  . Heart disease Mother   . Diabetes Mother   . Heart disease Father   . Cancer Father   . Cancer Maternal Grandmother   . Heart disease Maternal Grandfather    Social History:  reports that she has never smoked. She has never used smokeless tobacco. She reports that she does not drink alcohol or use drugs.  Review of Systems  Genitourinary: Positive for hematuria.  All other systems reviewed and are negative.   Physical Exam:  Vital signs in last 24 hours: Temp:  [97.7 F (36.5 C)-98 F (36.7 C)] 97.7 F  (36.5 C) (10/08 2102) Pulse Rate:  [42-76] 76 (10/08 2102) Resp:  [12-24] 12 (10/08 2102) BP: (100-145)/(44-69) 145/69 (10/08 2102) SpO2:  [96 %-100 %] 100 % (10/08 2102) Weight:  [78.5 kg] 78.5 kg (10/08 0039) Physical Exam  Constitutional: She is oriented to person, place, and time. She appears well-developed and well-nourished.  HENT:  Head: Normocephalic and atraumatic.  Eyes: Pupils are equal, round, and reactive to light. EOM are normal.  Neck: Normal range of motion. Neck supple. No thyromegaly present.  Cardiovascular: Normal rate and regular rhythm.  Respiratory: Effort normal. No respiratory distress.  GI: Soft. She exhibits no distension.  Musculoskeletal: Normal range of motion.        General: No edema.  Neurological: She is alert and oriented to person, place, and time.  Skin: Skin is warm and dry.  Psychiatric: She has a normal mood and affect. Her behavior is normal.    Laboratory Data:  Results for orders placed or performed during the hospital encounter of 12/28/18 (from the past 72 hour(s))  Urinalysis, Routine w reflex microscopic     Status: Abnormal   Collection Time: 12/28/18  5:43 PM  Result Value Ref Range   Color, Urine BROWN (A) YELLOW   APPearance CLOUDY (A) CLEAR   Specific Gravity, Urine 1.016 1.005 - 1.030   pH 5.0 5.0 - 8.0   Glucose, UA NEGATIVE NEGATIVE mg/dL   Hgb urine dipstick LARGE (A) NEGATIVE   Bilirubin Urine NEGATIVE NEGATIVE   Ketones, ur NEGATIVE NEGATIVE mg/dL   Protein, ur 100 (A) NEGATIVE mg/dL   Nitrite NEGATIVE NEGATIVE   Leukocytes,Ua MODERATE (A) NEGATIVE   RBC / HPF >50 (H) 0 - 5 RBC/hpf   WBC, UA >50 (H) 0 - 5 WBC/hpf   Bacteria, UA MANY (A) NONE SEEN   WBC Clumps PRESENT    Mucus PRESENT     Comment: Performed at [facility], [place] [address] [place], [place] [address]  Urine culture     Status: None (Preliminary result)   Collection Time: 12/28/18  5:43 PM   Specimen: Urine, Random  Result Value  Ref Range   Specimen Description      URINE, RANDOM Performed at [place] [address], [place], [place] [address]    Special Requests      NONE Performed at [facility], [place] [address] [place], [place] [address]    Culture PENDING    Report Status PENDING   Comprehensive metabolic panel     Status: Abnormal   Collection Time: 12/28/18  6:12 PM  Result Value Ref Range   Sodium 131 (L) 135 - 145 mmol/L   Potassium 5.5 (H) 3.5 - 5.1 mmol/L   Chloride 102 98 - 111 mmol/L   CO2 19 (L) 22 - 32 mmol/L   Glucose, Bld 188 (H) 70 -  99 mg/dL   BUN 34 (H) 8 - 23 mg/dL   Creatinine, Ser 1.57 (H) 0.44 - 1.00 mg/dL   Calcium 9.2 8.9 - 10.3 mg/dL   Total Protein 6.4 (L) 6.5 - 8.1 g/dL   Albumin 3.3 (L) 3.5 - 5.0 g/dL   AST 34 15 - 41 U/L   ALT 32 0 - 44 U/L   Alkaline Phosphatase 97 38 - 126 U/L   Total Bilirubin 1.6 (H) 0.3 - 1.2 mg/dL   GFR calc non Af Amer 33 (L) >60 mL/min   GFR calc Af Amer 38 (L) >60 mL/min   Anion gap 10 5 - 15    Comment: Performed at [facility], [place] [address] [place], [place] [address]  CBC with Differential     Status: Abnormal   Collection Time: 12/28/18  6:12 PM  Result Value Ref Range   WBC 6.1 4.0 - 10.5 K/uL   RBC 3.77 (L) 3.87 - 5.11 MIL/uL   Hemoglobin 11.8 (L) 12.0 - 15.0 g/dL   HCT 36.1 36.0 - 46.0 %   MCV 95.8 80.0 - 100.0 fL   MCH 31.3 26.0 - 34.0 pg   MCHC 32.7 30.0 - 36.0 g/dL   RDW 16.3 (H) 11.5 - 15.5 %   Platelets 116 (L) 150 - 400 K/uL    Comment: PLATELET COUNT CONFIRMED BY SMEAR RESULTS CONFIRMED BY MANUAL DILUTION Immature Platelet Fraction may be clinically indicated, consider ordering this additional test [id number]    nRBC 0.0 0.0 - 0.2 %   Neutrophils Relative % 62 %   Neutro Abs 3.8 1.7 - 7.7 K/uL   Lymphocytes Relative 20 %   Lymphs Abs 1.2 0.7 - 4.0 K/uL   Monocytes Relative 15 %   Monocytes Absolute 0.9 0.1 - 1.0 K/uL   Eosinophils Relative 1 %   Eosinophils  Absolute 0.1 0.0 - 0.5 K/uL   Basophils Relative 1 %   Basophils Absolute 0.1 0.0 - 0.1 K/uL   Immature Granulocytes 1 %   Abs Immature Granulocytes 0.03 0.00 - 0.07 K/uL   Ovalocytes PRESENT     Comment: Performed at [facility], [place] [address], [place], [place] [address]  Protime-INR     Status: None   Collection Time: 12/28/18  6:12 PM  Result Value Ref Range   Prothrombin Time 15.0 11.4 - 15.2 seconds   INR 1.2 0.8 - 1.2    Comment: (NOTE) INR goal varies based on device and disease states. Performed at [facility], [place] [address], [place], [place] [address]   Lactic acid, plasma     Status: Abnormal   Collection Time: 12/28/18  6:13 PM  Result Value Ref Range   Lactic Acid, Venous 2.5 (HH) 0.5 - 1.9 mmol/L    Comment: CRITICAL RESULT CALLED TO, READ BACK BY AND VERIFIED WITH: Jolaine Artist [id number] @ [place] Performed at [place] [address] [place], [place] [address]   Ammonia     Status: Abnormal   Collection Time: 12/28/18  6:13 PM  Result Value Ref Range   Ammonia 63 (H) 9 - 35 umol/L    Comment: Performed at [facility], [place] [address], [place], [place] [address]  Blood gas, venous (at [facility] and [facility], not at [facility])     Status: Abnormal   Collection Time: 12/28/18  6:14 PM  Result Value Ref Range   pH, Ven 7.343 7.250 - 7.430  pCO2, Ven 37.9 (L) 44.0 - 60.0 mmHg   pO2, Ven 43.9 32.0 - 45.0 mmHg   Bicarbonate 20.0 20.0 - 28.0 mmol/L   Acid-base deficit 4.7 (H) 0.0 - 2.0 mmol/L   O2 Saturation 72.6 %   Patient temperature 98.6     Comment: Performed at [facility], [place] [address], [place], [place] [address]  Hemoglobin A1c     Status: Abnormal   Collection Time: 12/28/18 10:38 PM  Result Value Ref Range   Hgb A1c MFr Bld 6.3 (H) 4.8 - 5.6 %    Comment: (NOTE) Pre diabetes:          5.7%-6.4% Diabetes:              >6.4% Glycemic control for    <7.0% adults with diabetes    Mean Plasma Glucose 134.11 mg/dL    Comment: Performed at [place] [address], [place], [place] [address]  Lactic acid, plasma     Status: None   Collection Time: 12/28/18 10:40 PM  Result Value Ref Range   Lactic Acid, Venous 1.8 0.5 - 1.9 mmol/L    Comment: Performed at [facility], [place] [address], [place], [place] [address]  SARS CORONAVIRUS 2 (TAT 6-24 HRS) Nasopharyngeal Nasopharyngeal Swab     Status: None   Collection Time: 12/28/18 10:53 PM   Specimen: Nasopharyngeal Swab  Result Value Ref Range   SARS Coronavirus 2 NEGATIVE NEGATIVE    Comment: (NOTE) SARS-CoV-2 target nucleic acids are NOT DETECTED. The SARS-CoV-2 RNA is generally detectable in upper and lower respiratory specimens during the acute phase of infection. Negative results do not preclude SARS-CoV-2 infection, do not rule out co-infections with other pathogens, and should not be used as the sole basis for treatment or other patient management decisions. Negative results must be combined with clinical observations, patient history, and epidemiological information. The expected result is Negative. Fact Sheet for Patients: SugarRoll.be Fact Sheet for Healthcare Providers: https://www.woods-mathews.com/ This test is not yet approved or cleared by the [place] FDA and  has been authorized for detection and/or diagnosis of SARS-CoV-2 by FDA under an Emergency Use Authorization (EUA). This EUA will remain  in effect (meaning this test can be used) for the duration of the COVID-19 declaration under Section 56 4(b)(1) of the Act, 21 U.S.C. section 360bbb-3(b)(1), unless the authorization is terminated or revoked sooner. Performed at [facility], [place] [address], [place], [place] [address]   CBG monitoring, ED     Status: Abnormal   Collection Time: 12/29/18 12:07 AM  Result Value Ref Range    Glucose-Capillary 123 (H) 70 - 99 mg/dL  Basic metabolic panel     Status: Abnormal   Collection Time: 12/29/18  4:13 AM  Result Value Ref Range   Sodium 136 135 - 145 mmol/L   Potassium 5.0 3.5 - 5.1 mmol/L   Chloride 105 98 - 111 mmol/L   CO2 19 (L) 22 - 32 mmol/L   Glucose, Bld 103 (H) 70 - 99 mg/dL   BUN 34 (H) 8 - 23 mg/dL   Creatinine, Ser 1.32 (H) 0.44 - 1.00 mg/dL   Calcium 8.9 8.9 - 10.3 mg/dL   GFR calc non Af Amer 41 (L) >60 mL/min   GFR calc Af Amer 47 (L) >60 mL/min   Anion gap 12 5 - 15    Comment: Performed at [facility], [place] [address], [place], [place] [address]  Bilirubin, fractionated(tot/dir/indir)     Status: Abnormal   Collection Time: 12/29/18  4:13 AM  Result Value Ref Range   Total Bilirubin 1.4 (H) 0.3 - 1.2 mg/dL   Bilirubin, Direct 0.4 (H) 0.0 - 0.2 mg/dL   Indirect Bilirubin 1.0 (H) 0.3 - 0.9 mg/dL    Comment: Performed at [facility], [place] [address] [place], [place] [address]  Ammonia     Status: Abnormal   Collection Time: 12/29/18  4:13 AM  Result Value Ref Range   Ammonia 57 (H) 9 - 35 umol/L    Comment: Performed at [facility], [place] [address], [place], [place] [address]  TSH     Status: None   Collection Time: 12/29/18  4:13 AM  Result Value Ref Range   TSH 3.562 0.350 - 4.500 uIU/mL    Comment: Performed by a 3rd Generation assay with a functional sensitivity of <=0.01 uIU/mL. Performed at [facility], [place] [address] [place], [place] [address]   T4, free     Status: Abnormal   Collection Time: 12/29/18  4:13 AM  Result Value Ref Range   Free T4 1.18 (H) 0.61 - 1.12 ng/dL    Comment: (NOTE) Biotin ingestion may interfere with free T4 tests. If the results are inconsistent with the TSH level, previous test results, or the clinical presentation, then consider biotin interference. If needed, order repeat testing after stopping biotin. Performed at [facility], [place] [address], [place], [place] [address]   CBC with Differential/Platelet     Status: Abnormal   Collection Time: 12/29/18  4:13 AM  Result Value Ref Range   WBC 4.6 4.0 - 10.5 K/uL   RBC 3.71 (L) 3.87 - 5.11 MIL/uL   Hemoglobin 11.4 (L) 12.0 - 15.0 g/dL   HCT 35.9 (L) 36.0 - 46.0 %   MCV 96.8 80.0 - 100.0 fL   MCH 30.7 26.0 - 34.0 pg   MCHC 31.8 30.0 - 36.0 g/dL   RDW 16.6 (H) 11.5 - 15.5 %   Platelets 68 (L) 150 - 400 K/uL    Comment: REPEATED TO VERIFY SPECIMEN CHECKED FOR CLOTS Immature Platelet Fraction may be clinically indicated, consider ordering this additional test [id number] CONSISTENT WITH PREVIOUS RESULT    nRBC 0.0 0.0 - 0.2 %   Neutrophils Relative % 57 %   Neutro Abs 2.6 1.7 - 7.7 K/uL   Lymphocytes Relative 24 %   Lymphs Abs 1.1 0.7 - 4.0 K/uL   Monocytes Relative 16 %   Monocytes Absolute 0.7 0.1 - 1.0 K/uL   Eosinophils Relative 2 %   Eosinophils Absolute 0.1 0.0 - 0.5 K/uL   Basophils Relative 1 %   Basophils Absolute 0.1 0.0 - 0.1 K/uL   Immature Granulocytes 0 %   Abs Immature Granulocytes 0.02 0.00 - 0.07 K/uL    Comment: Performed at [facility], [place] [address], [place], [place] [address]  Magnesium     Status: None   Collection Time: 12/29/18  4:13 AM  Result Value Ref Range   Magnesium 2.0 1.7 - 2.4 mg/dL    Comment: Performed at [facility], [place] [address], [place], [place] [address]  Phosphorus     Status: Abnormal   Collection Time: 12/29/18  4:13 AM  Result Value Ref Range   Phosphorus 5.1 (H) 2.5 - 4.6 mg/dL    Comment: Performed at [facility], [place] [address] [place],  [place] [address]  Glucose, capillary     Status: Abnormal   Collection Time: 12/29/18  7:29 AM  Result Value Ref Range   Glucose-Capillary 61 (L) 70 - 99 mg/dL  MRSA PCR Screening     Status: None   Collection Time: 12/29/18  7:41 AM   Specimen: Nasal Mucosa; Nasopharyngeal  Result Value Ref Range    MRSA by PCR NEGATIVE NEGATIVE    Comment:        The GeneXpert MRSA Assay (FDA approved for NASAL specimens only), is one component of a comprehensive MRSA colonization surveillance program. It is not intended to diagnose MRSA infection nor to guide or monitor treatment for MRSA infections. Performed at [facility], [place] [address], [place], [place] [address]   Glucose, capillary     Status: None   Collection Time: 12/29/18  7:46 AM  Result Value Ref Range   Glucose-Capillary 76 70 - 99 mg/dL  Culture, blood (routine x 2)     Status: None (Preliminary result)   Collection Time: 12/29/18  9:08 AM   Specimen: BLOOD RIGHT HAND  Result Value Ref Range   Specimen Description      BLOOD RIGHT HAND Performed at [place] [address], [place], [place] [address]    Special Requests      BOTTLES DRAWN AEROBIC ONLY Blood Culture adequate volume Performed at [place] [address] [place], [place] [address]    Culture      NO GROWTH < 12 HOURS Performed at [place] [address] [place] [address], [place], [place] [address]    Report Status PENDING   Culture, blood (routine x 2)     Status: None (Preliminary result)   Collection Time: 12/29/18  9:08 AM   Specimen: BLOOD LEFT HAND  Result Value Ref Range   Specimen Description      BLOOD LEFT HAND Performed at [place] [address] [place], [place] [address]    Special Requests      BOTTLES DRAWN AEROBIC ONLY Blood Culture results may not be optimal due to an inadequate volume of blood received in culture bottles Performed at [place] [address], [place], [place] [address]    Culture      NO GROWTH < 12 HOURS Performed at [place] [address] [place], [place] [address]    Report Status PENDING   Glucose, capillary     Status: Abnormal   Collection Time: 12/29/18 11:47 AM  Result Value Ref  Range   Glucose-Capillary 176 (H) 70 - 99 mg/dL  Glucose, capillary     Status: Abnormal   Collection Time: 12/29/18  4:24 PM  Result Value Ref Range   Glucose-Capillary 280 (H) 70 - 99 mg/dL  Glucose, capillary     Status: Abnormal   Collection Time: 12/29/18  8:59 PM  Result Value Ref Range   Glucose-Capillary 190 (H) 70 - 99 mg/dL   Recent Results (from the past 240 hour(s))  Urine culture     Status: None (Preliminary result)   Collection Time: 12/28/18  5:43 PM   Specimen: Urine, Random  Result Value Ref Range Status   Specimen Description   Final    URINE, RANDOM Performed at [facility], [place] [address], [place],  [address]    Special Requests   Final    NONE Performed at [facility], [place]  [place], [place] [address]    Culture PENDING  Incomplete   Report Status PENDING  Incomplete  SARS CORONAVIRUS 2 (TAT 6-24 HRS) Nasopharyngeal Nasopharyngeal Swab     Status: None   Collection Time: 12/28/18 10:53 PM   Specimen: Nasopharyngeal Swab  Result Value Ref Range Status   SARS Coronavirus 2 NEGATIVE NEGATIVE Final    Comment: (NOTE) SARS-CoV-2 target nucleic acids are NOT DETECTED. The SARS-CoV-2 RNA is generally detectable in upper and lower respiratory specimens during the acute phase of infection. Negative results do not preclude SARS-CoV-2 infection, do not rule out co-infections with other pathogens, and should not be used as the sole basis for treatment or other patient management decisions. Negative results must be combined with clinical observations, patient history, and epidemiological information. The expected result is Negative. Fact Sheet for Patients: SugarRoll.be Fact Sheet for Healthcare Providers: https://www.woods-mathews.com/ This test is not yet approved or cleared by the [place] FDA and  has been authorized for detection and/or diagnosis of SARS-CoV-2 by FDA  under an Emergency Use Authorization (EUA). This EUA will remain  in effect (meaning this test can be used) for the duration of the COVID-19 declaration under Section 56 4(b)(1) of the Act, 21 U.S.C. section 360bbb-3(b)(1), unless the authorization is terminated or revoked sooner. Performed at [facility], [place] [address] [place], [place] [address]   MRSA PCR Screening     Status: None   Collection Time: 12/29/18  7:41 AM   Specimen: Nasal Mucosa; Nasopharyngeal  Result Value Ref Range Status   MRSA by PCR NEGATIVE NEGATIVE Final    Comment:        The GeneXpert MRSA Assay (FDA approved for NASAL specimens only), is one component of a comprehensive MRSA colonization surveillance program. It is not intended to diagnose MRSA infection nor to guide or monitor treatment for MRSA infections. Performed at [facility], [place] [address], [place], [place] [address]   Culture, blood (routine x 2)     Status: None (Preliminary result)   Collection Time: 12/29/18  9:08 AM   Specimen: BLOOD RIGHT HAND  Result Value Ref Range Status   Specimen Description   Final    BLOOD RIGHT HAND Performed at [place] [address] [place], [place] [address]    Special Requests   Final    BOTTLES DRAWN AEROBIC ONLY Blood Culture adequate volume Performed at [place] [address] [place], [place] [address]    Culture   Final    NO GROWTH < 12 HOURS Performed at [place] [address] [place], [place] [address]    Report Status PENDING  Incomplete  Culture, blood (routine x 2)     Status: None (Preliminary result)   Collection Time: 12/29/18  9:08 AM   Specimen: BLOOD LEFT HAND  Result Value Ref Range Status   Specimen Description   Final    BLOOD LEFT HAND Performed at [place] [address], [place], [place] [address]    Special Requests   Final    BOTTLES DRAWN AEROBIC ONLY  Blood Culture results may not be optimal due to an inadequate volume of blood received in culture bottles Performed at [place] [address] [place],  [address]    Culture   Final    NO GROWTH < 12 HOURS Performed at [place] [address] [place],  [address]  Report Status PENDING  Incomplete   Creatinine: Recent Labs    12/28/18 1812 12/29/18 0413  CREATININE 1.57* 1.32*   Baseline Creatinine: 1.2  Impression/Assessment:  72yo with gross hematuria likely related to hemorrhagic cystitis  Plan:  Gross hematuria: I discussed the various causes of gross hematuria with the patient and the workup for gross hematuria. Please send her urine for cystology. She will be scheduled for outpatient cystoscopy. Currently she has a stable hemoglobin and is emptying well and does not require any further intervention at this point for her gross hematuria.   Nicolette Bang 12/29/2018, 9:27 PM

## 2018-12-29 NOTE — Progress Notes (Signed)
NT reported maroon colored stool with blood clots when assist pt with bedpan. Dr. Alfredia Ferguson made aware.

## 2018-12-29 NOTE — Progress Notes (Signed)
Hypoglycemic Event  CBG: 61  Treatment: 4 oz juice/soda  Symptoms: None  Follow-up CBG: [id number] CBG Result:76  Possible Reasons for Event: Inadequate meal intake  Comments/MD notified: Dr. Rosine Beat, Francetta Found

## 2018-12-29 NOTE — Evaluation (Signed)
Physical Therapy Evaluation Patient Details Name: Brittany Russo MRN: [id number] DOB: 1945-11-28 Today's Date: 12/29/2018   History of Present Illness  Pt is a 73 y.o. female with multiple admissions 10/10/18 for lower GI bleed and 09/22/18 for R TKA with subsequent fall resulting in readmission with R distal femur fx s/p TKA repair; also with PMH includes HTN, DM2, obesity.  Admission for repair right knee extensor mechanism on 11/22/18. 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  Pt currently admitted 12/28/18 for Acute hepatic encephalopathy  Clinical Impression  Pt admitted with above diagnosis.  Pt currently with functional limitations due to the deficits listed below (see PT Problem List). Pt will benefit from skilled PT to increase their independence and safety with mobility to allow discharge to the venue listed below.  Pt reports working on transfers and standing at rehab.  Pt with increased external rotation and knee flexion of right leg even with bledsoe brace locked in place.  Repositioned brace and attempted to assist leg into more neutral resting position however difficult to obtain straight leg.  Pt at rest for skin break down and brace with padding however heel not guarded so RN notified.  Discussed ideal positioning for R leg with knee extension with pt and today's nursing staff.  Please monitor for skin breakdown (prolonged brace use) and float/protect heels.     Follow Up Recommendations SNF;Supervision/Assistance - 24 hour    Equipment Recommendations  None recommended by PT    Recommendations for Other Services       Precautions / Restrictions Precautions Precautions: Fall;Knee Precaution Comments: No ROM to R knee Required Braces or Orthoses: Other Brace Knee Immobilizer - Right: On at all times Other Brace: bledsoe brace locked in full extension Restrictions Weight Bearing Restrictions: Yes RLE Partial Weight  Bearing Percentage or Pounds: 50 Other Position/Activity Restrictions: all precautions per least admission, pt states Dr. Alvan Dame has not updated and plans for more surgery in a couple weeks.      Mobility  Bed Mobility Overal bed mobility: Needs Assistance Bed Mobility: Supine to Sit;Sit to Supine     Supine to sit: Mod assist;+2 for physical assistance Sit to supine: +2 for physical assistance;Max assist   General bed mobility comments: assist for scooting out EOB, provided support/ positioning for R LE, more assist back to bed due to fatigue  Transfers                 General transfer comment: declined to attempt today  Ambulation/Gait                Stairs            Wheelchair Mobility    Modified Rankin (Stroke Patients Only)       Balance Overall balance assessment: Needs assistance Sitting-balance support: Bilateral upper extremity supported Sitting balance-Leahy Scale: Poor Sitting balance - Comments: requires UE support                                     Pertinent Vitals/Pain Pain Assessment: No/denies pain Pain Intervention(s): Monitored during session;Repositioned    Home Living Family/patient expects to be discharged to:: Skilled nursing facility                 Additional Comments: Recent d/c to [facility] and plans to return    Prior Function  Comments: pt reports she was mobilizing at SNF in rehab with standing at parallel bars and working on transfers with sliding board to w/c     Hand Dominance        Extremity/Trunk Assessment   Upper Extremity Assessment Upper Extremity Assessment: Generalized weakness    Lower Extremity Assessment Lower Extremity Assessment: Generalized weakness RLE Deficits / Details: knee in brace locked in extension, limited DF, ankle edema observed; pt also resting in external rotation and knee flexion despite locked brace and increased foot/ankle  eversion, repositioned brace as best as possible; pt likely has been positioning this way for a while RLE: Unable to fully assess due to immobilization       Communication   Communication: No difficulties  Cognition Arousal/Alertness: Awake/alert Behavior During Therapy: WFL for tasks assessed/performed Overall Cognitive Status: Within Functional Limits for tasks assessed                                 General Comments: pt appropriate during session, recalls rehab at [facility]      General Comments      Exercises     Assessment/Plan    PT Assessment Patient needs continued PT services  PT Problem List Decreased strength;Decreased mobility;Decreased range of motion;Decreased balance;Decreased knowledge of use of DME       PT Treatment Interventions DME instruction;Stair training;Functional mobility training;Therapeutic activities;Therapeutic exercise;Patient/family education;Wheelchair mobility training;Balance training    PT Goals (Current goals can be found in the Care Plan section)  Acute Rehab PT Goals PT Goal Formulation: With patient Time For Goal Achievement: 01/12/19 Potential to Achieve Goals: Good    Frequency Min 2X/week   Barriers to discharge        Co-evaluation               AM-PAC PT "6 Clicks" Mobility  Outcome Measure Help needed turning from your back to your side while in a flat bed without using bedrails?: A Lot Help needed moving from lying on your back to sitting on the side of a flat bed without using bedrails?: A Lot Help needed moving to and from a bed to a chair (including a wheelchair)?: Total Help needed standing up from a chair using your arms (e.g., wheelchair or bedside chair)?: Total Help needed to walk in hospital room?: Total Help needed climbing 3-5 steps with a railing? : Total 6 Click Score: 8    End of Session   Activity Tolerance: Patient limited by fatigue Patient left: with call bell/phone within reach;in  bed;with bed alarm set Nurse Communication: Mobility status;Precautions PT Visit Diagnosis: Muscle weakness (generalized) (M62.81);Other abnormalities of gait and mobility (R26.89)    Time: [id number] PT Time Calculation (min) (ACUTE ONLY): 22 min   Charges:   PT Evaluation $PT Eval Low Complexity: [place], PT, DPT Acute Rehabilitation Services Office: [phone] Pager: [phone]  Trena Platt 12/29/2018, 2:44 PM

## 2018-12-29 NOTE — ED Notes (Signed)
Blood in brief. Peri-care performed.

## 2018-12-29 NOTE — Consult Note (Signed)
Reason for Consult: GI bleed Referring Physician: Hospital team  Brittany Russo is an 73 y.o. female.  HPI: Patient seen and examined in her hospital computer chart reviewed and her case discussed with the hospital team and she has had been having some periodic bleeding and has been on some blood thinners but also has been taking meloxicam and ibuprofen although she is not on any currently now and she was scheduled at another facility for an endoscopy and a flex sig and she has had colonoscopies in the past and a note by a different gastroenterologist implies the last one was 3 to 4 years ago and she says her family history is negative and she is not having any abdominal pain and is doing better mentally according to the hospital team but she does have trouble remembering dates and exact facts and her previous CTs were reviewed and there was a question of a rectovaginal fistula not confirmed on last pelvic CT  Past Medical History:  Diagnosis Date  . Acute and subacute hepatic failure without coma   . Allergy   . Anasarca   . Anxiety   . Arthritis   . Ascites 09/2018   no SBP on 850 cc tap 10/13/18  . Cirrhosis of liver ([place]) ~ 2004   from NAFLD.  Dx ~ 2004.  Dr Dorcas Mcmurray at [facility]/    . Depression   . Diabetes mellitus without complication ([place])    type 2  . Eczema of both hands   . Endometrial cancer ([place]) 1996, 2003   hysterectomy 1996.  recurrence 2003, treated with radiation.    . Generalized edema   . GI hemorrhage   . Headache   . 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 in [place]  . Hyperlipidemia   . Hypertension   . Muscle weakness   . Seizures ([place])    last seizure June 07, 2014   . Thrombocytopenia ([place])   . Thyroid nodule     Past Surgical History:  Procedure Laterality Date  . CATARACT EXTRACTION W/PHACO Left 06/04/2017   Procedure: CATARACT EXTRACTION PHACO AND INTRAOCULAR LENS PLACEMENT (IOC);   Surgeon: Baruch Goldmann, MD;  Location: [facility];  Service: Ophthalmology;  Laterality: Left;  CDE: 3.90  . CATARACT EXTRACTION W/PHACO Right 07/15/2017   Procedure: CATARACT EXTRACTION PHACO AND INTRAOCULAR LENS PLACEMENT (IOC);  Surgeon: Baruch Goldmann, MD;  Location: [facility];  Service: Ophthalmology;  Laterality: Right;  CDE: 7.60  . COLONOSCOPY  06/2007   in [place]. for hematochzia.  radiation proctitis  . DILATION AND CURETTAGE OF UTERUS    . ESOPHAGOGASTRODUODENOSCOPY  2016  . IR PARACENTESIS  10/13/2018  . TOTAL ABDOMINAL HYSTERECTOMY  1996  . TOTAL KNEE ARTHROPLASTY Right 09/22/2018   Procedure: TOTAL KNEE ARTHROPLASTY;  Surgeon: Paralee Cancel, MD;  Location: [facility];  Service: Orthopedics;  Laterality: Right;  70 mins  . TOTAL KNEE REVISION Right 09/27/2018   Procedure: TOTAL KNEE REVISION;  Surgeon: Paralee Cancel, MD;  Location: [facility];  Service: Orthopedics;  Laterality: Right;  . TOTAL KNEE REVISION Right 11/22/2018   Procedure: repair right knee extensor mechanism;  Surgeon: Paralee Cancel, MD;  Location: [facility];  Service: Orthopedics;  Laterality: Right;  90 mins    Family History  Problem Relation Age of Onset  . Heart disease Mother   . Diabetes Mother   . Heart disease Father   . Cancer Father   . Cancer Maternal  Grandmother   . Heart disease Maternal Grandfather     Social History:  reports that she has never smoked. She has never used smokeless tobacco. She reports that she does not drink alcohol or use drugs.  Allergies:  Allergies  Allergen Reactions  . Asa [Aspirin] Other (See Comments)    Pt not allergic but cannot take due to bleeding   . Other Other (See Comments)    Any jewelry metal-hives and itching     Medications: I have reviewed the patient's current medications.  Results for orders placed or performed during the hospital encounter of 12/28/18 (from the past 48 hour(s))  Urinalysis, Routine w reflex microscopic     Status: Abnormal   Collection Time: 12/28/18   5:43 PM  Result Value Ref Range   Color, Urine BROWN (A) YELLOW   APPearance CLOUDY (A) CLEAR   Specific Gravity, Urine 1.016 1.005 - 1.030   pH 5.0 5.0 - 8.0   Glucose, UA NEGATIVE NEGATIVE mg/dL   Hgb urine dipstick LARGE (A) NEGATIVE   Bilirubin Urine NEGATIVE NEGATIVE   Ketones, ur NEGATIVE NEGATIVE mg/dL   Protein, ur 100 (A) NEGATIVE mg/dL   Nitrite NEGATIVE NEGATIVE   Leukocytes,Ua MODERATE (A) NEGATIVE   RBC / HPF >50 (H) 0 - 5 RBC/hpf   WBC, UA >50 (H) 0 - 5 WBC/hpf   Bacteria, UA MANY (A) NONE SEEN   WBC Clumps PRESENT    Mucus PRESENT     Comment: Performed at [facility], [place] [address] [place], [place] [address]  Urine culture     Status: None (Preliminary result)   Collection Time: 12/28/18  5:43 PM   Specimen: Urine, Random  Result Value Ref Range   Specimen Description      URINE, RANDOM Performed at [place] [address], [place], [place] [address]    Special Requests      NONE Performed at [facility], [place] [address] [place], [place] [address]    Culture PENDING    Report Status PENDING   Comprehensive metabolic panel     Status: Abnormal   Collection Time: 12/28/18  6:12 PM  Result Value Ref Range   Sodium 131 (L) 135 - 145 mmol/L   Potassium 5.5 (H) 3.5 - 5.1 mmol/L   Chloride 102 98 - 111 mmol/L   CO2 19 (L) 22 - 32 mmol/L   Glucose, Bld 188 (H) 70 - 99 mg/dL   BUN 34 (H) 8 - 23 mg/dL   Creatinine, Ser 1.57 (H) 0.44 - 1.00 mg/dL   Calcium 9.2 8.9 - 10.3 mg/dL   Total Protein 6.4 (L) 6.5 - 8.1 g/dL   Albumin 3.3 (L) 3.5 - 5.0 g/dL   AST 34 15 - 41 U/L   ALT 32 0 - 44 U/L   Alkaline Phosphatase 97 38 - 126 U/L   Total Bilirubin 1.6 (H) 0.3 - 1.2 mg/dL   GFR calc non Af Amer 33 (L) >60 mL/min   GFR calc Af Amer 38 (L) >60 mL/min   Anion gap 10 5 - 15    Comment: Performed at [facility], [place] [address], [place], [place] [address]  CBC with Differential     Status:  Abnormal   Collection Time: 12/28/18  6:12 PM  Result Value Ref Range   WBC 6.1 4.0 - 10.5 K/uL   RBC 3.77 (L) 3.87 - 5.11 MIL/uL   Hemoglobin 11.8 (L) 12.0 -  15.0 g/dL   HCT 36.1 36.0 - 46.0 %   MCV 95.8 80.0 - 100.0 fL   MCH 31.3 26.0 - 34.0 pg   MCHC 32.7 30.0 - 36.0 g/dL   RDW 16.3 (H) 11.5 - 15.5 %   Platelets 116 (L) 150 - 400 K/uL    Comment: PLATELET COUNT CONFIRMED BY SMEAR RESULTS CONFIRMED BY MANUAL DILUTION Immature Platelet Fraction may be clinically indicated, consider ordering this additional test [id number]    nRBC 0.0 0.0 - 0.2 %   Neutrophils Relative % 62 %   Neutro Abs 3.8 1.7 - 7.7 K/uL   Lymphocytes Relative 20 %   Lymphs Abs 1.2 0.7 - 4.0 K/uL   Monocytes Relative 15 %   Monocytes Absolute 0.9 0.1 - 1.0 K/uL   Eosinophils Relative 1 %   Eosinophils Absolute 0.1 0.0 - 0.5 K/uL   Basophils Relative 1 %   Basophils Absolute 0.1 0.0 - 0.1 K/uL   Immature Granulocytes 1 %   Abs Immature Granulocytes 0.03 0.00 - 0.07 K/uL   Ovalocytes PRESENT     Comment: Performed at [facility], [place] [address], [place], [place] [address]  Protime-INR     Status: None   Collection Time: 12/28/18  6:12 PM  Result Value Ref Range   Prothrombin Time 15.0 11.4 - 15.2 seconds   INR 1.2 0.8 - 1.2    Comment: (NOTE) INR goal varies based on device and disease states. Performed at [facility], [place] [address] [place], [place] [address]   Lactic acid, plasma     Status: Abnormal   Collection Time: 12/28/18  6:13 PM  Result Value Ref Range   Lactic Acid, Venous 2.5 (HH) 0.5 - 1.9 mmol/L    Comment: CRITICAL RESULT CALLED TO, READ BACK BY AND VERIFIED WITH: Jolaine Artist [id number] @ [place] Performed at [place] [address] [place], [place] [address]   Ammonia     Status: Abnormal   Collection Time: 12/28/18  6:13 PM  Result Value Ref Range   Ammonia 63 (H) 9 - 35 umol/L    Comment: Performed at  [facility], [place] [address], [place], [place] [address]  Blood gas, venous (at [facility] and [facility], not at [facility])     Status: Abnormal   Collection Time: 12/28/18  6:14 PM  Result Value Ref Range   pH, Ven 7.343 7.250 - 7.430   pCO2, Ven 37.9 (L) 44.0 - 60.0 mmHg   pO2, Ven 43.9 32.0 - 45.0 mmHg   Bicarbonate 20.0 20.0 - 28.0 mmol/L   Acid-base deficit 4.7 (H) 0.0 - 2.0 mmol/L   O2 Saturation 72.6 %   Patient temperature 98.6     Comment: Performed at [facility], [place] [address] [place],  [address]  Hemoglobin A1c     Status: Abnormal   Collection Time: 12/28/18 10:38 PM  Result Value Ref Range   Hgb A1c MFr Bld 6.3 (H) 4.8 - 5.6 %    Comment: (NOTE) Pre diabetes:          5.7%-6.4% Diabetes:              >6.4% Glycemic control for   <7.0% adults with diabetes    Mean Plasma Glucose 134.11 mg/dL    Comment: Performed at [facility] [address], [place], [place] [address]  Lactic acid, plasma     Status: None  Collection Time: 12/28/18 10:40 PM  Result Value Ref Range   Lactic Acid, Venous 1.8 0.5 - 1.9 mmol/L    Comment: Performed at [facility], [place] [address], [place], [place] [address]  SARS CORONAVIRUS 2 (TAT 6-24 HRS) Nasopharyngeal Nasopharyngeal Swab     Status: None   Collection Time: 12/28/18 10:53 PM   Specimen: Nasopharyngeal Swab  Result Value Ref Range   SARS Coronavirus 2 NEGATIVE NEGATIVE    Comment: (NOTE) SARS-CoV-2 target nucleic acids are NOT DETECTED. The SARS-CoV-2 RNA is generally detectable in upper and lower respiratory specimens during the acute phase of infection. Negative results do not preclude SARS-CoV-2 infection, do not rule out co-infections with other pathogens, and should not be used as the sole basis for treatment or other patient management decisions. Negative results must be combined with clinical observations, patient history, and epidemiological information. The  expected result is Negative. Fact Sheet for Patients: SugarRoll.be Fact Sheet for Healthcare Providers: https://www.woods-mathews.com/ This test is not yet approved or cleared by the [place] FDA and  has been authorized for detection and/or diagnosis of SARS-CoV-2 by FDA under an Emergency Use Authorization (EUA). This EUA will remain  in effect (meaning this test can be used) for the duration of the COVID-19 declaration under Section 56 4(b)(1) of the Act, 21 U.S.C. section 360bbb-3(b)(1), unless the authorization is terminated or revoked sooner. Performed at [facility], [place] [address], [place], [place] [address]   CBG monitoring, ED     Status: Abnormal   Collection Time: 12/29/18 12:07 AM  Result Value Ref Range   Glucose-Capillary 123 (H) 70 - 99 mg/dL  Basic metabolic panel     Status: Abnormal   Collection Time: 12/29/18  4:13 AM  Result Value Ref Range   Sodium 136 135 - 145 mmol/L   Potassium 5.0 3.5 - 5.1 mmol/L   Chloride 105 98 - 111 mmol/L   CO2 19 (L) 22 - 32 mmol/L   Glucose, Bld 103 (H) 70 - 99 mg/dL   BUN 34 (H) 8 - 23 mg/dL   Creatinine, Ser 1.32 (H) 0.44 - 1.00 mg/dL   Calcium 8.9 8.9 - 10.3 mg/dL   GFR calc non Af Amer 41 (L) >60 mL/min   GFR calc Af Amer 47 (L) >60 mL/min   Anion gap 12 5 - 15    Comment: Performed at [facility], [place] [address] [place], [place] [address]  Bilirubin, fractionated(tot/dir/indir)     Status: Abnormal   Collection Time: 12/29/18  4:13 AM  Result Value Ref Range   Total Bilirubin 1.4 (H) 0.3 - 1.2 mg/dL   Bilirubin, Direct 0.4 (H) 0.0 - 0.2 mg/dL   Indirect Bilirubin 1.0 (H) 0.3 - 0.9 mg/dL    Comment: Performed at [facility], [place] [address], [place], [place] [address]  Ammonia     Status: Abnormal   Collection Time: 12/29/18  4:13 AM  Result Value Ref Range   Ammonia 57 (H) 9 - 35 umol/L    Comment: Performed at [facility], [place] [address], [place], [place] [address]  TSH     Status: None   Collection Time: 12/29/18  4:13 AM  Result Value Ref Range   TSH 3.562 0.350 - 4.500 uIU/mL    Comment: Performed by a 3rd Generation assay with a functional sensitivity of <=0.01 uIU/mL. Performed at [facility], [place] [address] [place], [place] [address]  T4, free     Status: Abnormal   Collection Time: 12/29/18  4:13 AM  Result Value Ref Range   Free T4 1.18 (H) 0.61 - 1.12 ng/dL    Comment: (NOTE) Biotin ingestion may interfere with free T4 tests. If the results are inconsistent with the TSH level, previous test results, or the clinical presentation, then consider biotin interference. If needed, order repeat testing after stopping biotin. Performed at [facility], [place] [address] [place], [place] [address]   CBC with Differential/Platelet     Status: Abnormal   Collection Time: 12/29/18  4:13 AM  Result Value Ref Range   WBC 4.6 4.0 - 10.5 K/uL   RBC 3.71 (L) 3.87 - 5.11 MIL/uL   Hemoglobin 11.4 (L) 12.0 - 15.0 g/dL   HCT 35.9 (L) 36.0 - 46.0 %   MCV 96.8 80.0 - 100.0 fL   MCH 30.7 26.0 - 34.0 pg   MCHC 31.8 30.0 - 36.0 g/dL   RDW 16.6 (H) 11.5 - 15.5 %   Platelets 68 (L) 150 - 400 K/uL    Comment: REPEATED TO VERIFY SPECIMEN CHECKED FOR CLOTS Immature Platelet Fraction may be clinically indicated, consider ordering this additional test [id number] CONSISTENT WITH PREVIOUS RESULT    nRBC 0.0 0.0 - 0.2 %   Neutrophils Relative % 57 %   Neutro Abs 2.6 1.7 - 7.7 K/uL   Lymphocytes Relative 24 %   Lymphs Abs 1.1 0.7 - 4.0 K/uL   Monocytes Relative 16 %   Monocytes Absolute 0.7 0.1 - 1.0 K/uL   Eosinophils Relative 2 %   Eosinophils Absolute 0.1 0.0 - 0.5 K/uL   Basophils Relative 1 %   Basophils Absolute 0.1 0.0 - 0.1 K/uL   Immature Granulocytes 0 %   Abs Immature Granulocytes 0.02 0.00 - 0.07 K/uL    Comment: Performed at [facility], [place] [address], [place], [place] [address]  Magnesium     Status: None   Collection Time: 12/29/18  4:13 AM  Result Value Ref Range   Magnesium 2.0 1.7 - 2.4 mg/dL    Comment: Performed at [facility], [place] [address], [place], [place] [address]  Phosphorus     Status: Abnormal   Collection Time: 12/29/18  4:13 AM  Result Value Ref Range   Phosphorus 5.1 (H) 2.5 - 4.6 mg/dL    Comment: Performed at [facility], [place] [address], [place], [place] [address]  Glucose, capillary     Status: Abnormal   Collection Time: 12/29/18  7:29 AM  Result Value Ref Range   Glucose-Capillary 61 (L) 70 - 99 mg/dL  MRSA PCR Screening     Status: None   Collection Time: 12/29/18  7:41 AM   Specimen: Nasal Mucosa; Nasopharyngeal  Result Value Ref Range   MRSA by PCR NEGATIVE NEGATIVE    Comment:        The GeneXpert MRSA Assay (FDA approved for NASAL specimens only), is one component of a comprehensive MRSA colonization surveillance program. It is not intended to diagnose MRSA infection nor to guide or monitor treatment for MRSA infections. Performed at [facility], [place] [address], [place], [place] [address]   Glucose, capillary     Status: None   Collection Time: 12/29/18  7:46 AM  Result Value Ref Range   Glucose-Capillary 76 70 - 99 mg/dL  Culture, blood (routine x 2)     Status: None (Preliminary result)  Collection Time: 12/29/18  9:08 AM   Specimen: BLOOD RIGHT HAND  Result Value Ref Range   Specimen Description      BLOOD RIGHT HAND Performed at [place] [address] [place], [place] [address]    Special Requests      BOTTLES DRAWN AEROBIC ONLY Blood Culture adequate volume Performed at [place] [address], [place], [place] [address]    Culture      NO GROWTH < 12 HOURS Performed at [place] [address], [place], [place] [address]    Report  Status PENDING   Culture, blood (routine x 2)     Status: None (Preliminary result)   Collection Time: 12/29/18  9:08 AM   Specimen: BLOOD LEFT HAND  Result Value Ref Range   Specimen Description      BLOOD LEFT HAND Performed at [place] [address] [place] [address], [place], [place] [address]    Special Requests      BOTTLES DRAWN AEROBIC ONLY Blood Culture results may not be optimal due to an inadequate volume of blood received in culture bottles Performed at [place] [address] [place], [place] [address]    Culture      NO GROWTH < 12 HOURS Performed at [place] [address] [place],  [address]    Report Status PENDING   Glucose, capillary     Status: Abnormal   Collection Time: 12/29/18 11:47 AM  Result Value Ref Range   Glucose-Capillary 176 (H) 70 - 99 mg/dL    Ct Head Wo Contrast  Result Date: 12/28/2018 CLINICAL DATA:  Confusion.  Hallucinations. EXAM: CT HEAD WITHOUT CONTRAST TECHNIQUE: Contiguous axial images were obtained from the base of the skull through the vertex without intravenous contrast. COMPARISON:  Head CT 09/25/2018 FINDINGS: Brain: No acute intracranial hemorrhage. No focal mass lesion. No CT evidence of acute infarction. No midline shift or mass effect. No hydrocephalus. Basilar cisterns are patent. There are minimal periventricular and subcortical white matter hypodensities. Minimal generalized cortical atrophy. Vascular: No hyperdense vessel or unexpected calcification. Skull: Normal. Negative for fracture or focal lesion. Sinuses/Orbits: Small volume fluid in the maxillary sinus. Mastoid air cells are clear. Orbits are clear. Other: None. IMPRESSION: 1. No acute intracranial findings. 2. Minimal cerebral atrophy and white matter microvascular disease. 3. Chronic maxillary sinusitis. Electronically Signed   By: Suzy Bouchard M.D.   On: 12/28/2018 18:25    ROSBlood pressure (!) 111/53, pulse 66,  temperature 97.8 F (36.6 C), temperature source Oral, resp. rate 18, height 5\' 2"  (1.575 m), weight 78.5 kg, SpO2 100 %. Physical Exam vital signs stable afebrile no acute distress seems to answer most questions appropriately abdomen is soft nontender BUN and creatinine stable liver tests okay hemoglobin stable platelet count chronically low INR okay Assessment/Plan: Cirrhosis due to [facility] and probable bright red blood per rectum's secondary to radiation proctitis Plan: We discussed the risks benefits and methods of endoscopy and flex sig with possible therapy and will proceed tomorrow at 730 with further work-up and plans pending those findings  [place] E 12/29/2018, 1:09 PM

## 2018-12-29 NOTE — Progress Notes (Signed)
PROGRESS NOTE    Brittany Russo  LNL:[id number] DOB: May 04, 1945 DOA: 12/28/2018 PCP: Rosalee Kaufman, PA-C   Brief Narrative:  HPI per Dr. Shela Leff on 12/28/2018 Brittany Russo is a 73 y.o. female with medical history significant of liver cirrhosis secondary to NAFLD, insulin-dependent diabetes mellitus, depression, anxiety, hypertension, hyperlipidemia, seizure disorder, recent right knee extensor mechanism repair on 11/22/2018 presenting to the hospital from her SNF for evaluation of AMS.  Patient is confused and slow to respond to questions.  She is not sure why she is here.  Oriented to self and knows it is October 2020.  Not oriented to place.  Denies fevers, chest pain, shortness of breath, cough, nausea, vomiting, abdominal pain, or dysuria.  No additional history could be obtained from her.  ED Course: Bradycardic with heart rate in the 40s.  Not hypotensive.  Afebrile and no leukocytosis.  Lactic acid 2.5.  Ammonia level 63, elevated compared to prior labs.  Hemoglobin 11.8, above baseline.  Platelet count 116,000, has chronic thrombocytopenia and platelet count is currently improved from baseline.  Sodium 131, chronically low and at baseline.  Potassium 5.5.  Bicarb 19, anion gap 10.  Blood glucose 188.  BUN 34, creatinine 1.5.  Creatinine was 0.7 on 9/13.  T bili 1.6, remainder of LFTs normal.  INR 1.2.  UA with large amount of leukocytes, greater than 50 RBCs, greater than 50 WBCs, and many bacteria on microscopic examination.  Urine culture pending.  VBG with pH 7.34.  Head CT negative for acute finding. Patient received ceftriaxone, lactulose, and 1 L normal saline bolus.  **Interim History  Patient started having some hematuria and some melena so GI and urology were consulted.  Encephalopathy appears to be improving  Assessment & Plan:   Principal Problem:   Hepatic encephalopathy ([place]) Active Problems:   AKI (acute kidney injury) ([place])   UTI (urinary tract infection)    Bradycardia   Hyperkalemia  Acute Hepatic Encephalopathy -History of liver cirrhosis secondary to NAFLD -Patient was confused and slow to respond to questions and on admissioj wasOriented to self and knows it is October 2020.  Not oriented to place and does not know why she is here.   -Ammonia level 63 on admission and was, elevated compared to prior labs and is improved -No lactulose listed in home medications.  Only rifaximin is listed.   -Stroke less likely as head CT negative and neuro exam nonfocal.   -Does have a UTI which is also likely contributing to the patient's encephalopathy.   -In addition, polypharmacy could also be contributing as Xanax, Norco, and Robaxin listed in home medications. -Lactulose started  -Continue rifaximin -Repeat ammonia level this AM showed improvement and was 57 -Treatment of UTI as mentioned below -Hold sedating medications -Continue to monitor mental status  UTI with Hematuria -Afebrile no leukocytosis.   -Lactic acid mildly elevated, dehydration could be contributing.   -UA with large amount of leukocytes, greater than 50 RBCs, greater than 50 WBCs, and many bacteria on microscopic examination. -Continue IV ceftriaxone -Urine culture pending along with Blood Cx -Urology consulted for further evaluation given her Hematuria  Melena with Clots  -Dr. Watt Climes was consulted for further evaluation recommendations and feels that this is likely secondary to radiation proctitis and he has discussed the risks and benefits and methods of endoscopy and flex sig and will proceed tomorrow 7:30 AM -Continue to monitor for signs and symptoms of bleeding; continue to monitor hemoglobin and hematocrit and  trend  AKI, improving  -BUN 34, creatinine 1.57 on Admission.   -Creatinine was 0.7 on 9/13.  Likely prerenal secondary to dehydration and home diuretic use. -IV fluid hydration with NS at 75 mL/hr to be continued  -Continue to monitor renal function  -Monitor urine output -Hold diuretics  Bradycardia, PR prolongation Bradycardic with heart rate in the 40s.  Not hypotensive.  Atenolol and Cardizem listed in home medications. -Cardiac monitoring -Hold atenolol and Cardizem -Check TSH and free T4 levels  Mild lactic acidosis -Lactic acid 2.5.  Suspect related to dehydration.  Does have a UTI but no fever or leukocytosis. -Received 1 L fluid bolus in the ED.  Continue IV fluid hydration with NS at 75 mL/hr -Continue to trend lactate  Mild hyperkalemia, improving  Potassium 5.5.  Likely related to AKI and home medications (spironolactone and potassium supplement). -Cardiac monitoring -Hold spironolactone and potassium supplement -K+ is now 5.0  -Continue to monitor potassium level  Normal anion gap metabolic acidosis -Bicarb 19, anion gap 10 on Admission and now CO2 is still 19 and AG is now 12 with Chloride Level of 105.   -Likely related to home diuretic use and AKI. -Hold diuretic -IV fluid hydration -Continue to monitor  Hyperbilirubinemia -T bili was 1.6 on Admission.   -Does have a history of cirrhosis but transaminases are normal. -Checked fractionated bilirubin level and now T Bili is 1.4 and Direct is 0.4 and Indirect is 1.0 -Continue to Monitor and Trend -Repeat CMP in AM   Insulin-dependent diabetes mellitus -Checked A1c level and was 6.3.   -Sliding scale insulin sensitive and CBG checks.  Recent knee surgery Patient underwent right knee extensor mechanism repair on 11/22/2018.  Has a knee brace in place. -PT evaluation -Outpatient orthopedic follow-up  Hyponatremia -Improved and went from 131 -> 136 -C/w IVF Hydration with NS at 75 mL/hr -Continue to Monitor and Trend -Repeat CMP in AM   Thrombocytopenia -In the setting of liver cirrhosis and likely splenic sequestration but worsened in the setting of bleeding The patient platelet count went from 116,000 now 68,000 -Continue to monitor for signs  and symptoms of bleeding and she is having some melena and some hematuria -Repeat CBC  Obesity -Estimated body mass index is 31.65 kg/m as calculated from the following:   Height as of this encounter: 5' 2"  (1.575 m).   Weight as of this encounter: 78.5 kg. -Weight Loss and Dietary Counseling given   DVT prophylaxis: SCDs Code Status: FULL CODE Family Communication: No family present at bedside  Disposition Plan: Pending further workup and improvement back to baseline  Consultants:   Gastroenterology   Urology    Procedures: None  Antimicrobials:  Anti-infectives (From admission, onward)   Start     Dose/Rate Route Frequency Ordered Stop   12/29/18 2200  cefTRIAXone (ROCEPHIN) 1 g in sodium chloride 0.9 % 100 mL IVPB     1 g 200 mL/hr over 30 Minutes Intravenous Every 24 hours 12/28/18 2240     12/29/18 1000  rifaximin (XIFAXAN) tablet 550 mg     550 mg Oral 2 times daily 12/28/18 2240     12/28/18 2200  cefTRIAXone (ROCEPHIN) 1 g in sodium chloride 0.9 % 100 mL IVPB     1 g 200 mL/hr over 30 Minutes Intravenous  Once 12/28/18 2153 12/28/18 2257     Subjective: Patient examined at bedside and she was much more awake but still complaining of some right leg pain.  Also complaining  of some hematuria and denies any shortness of breath.  No nausea or vomiting.  No other concerns or plans at this time.  Objective: Vitals:   12/28/18 2332 12/29/18 0000 12/29/18 0039 12/29/18 0450  BP: (!) 103/56 (!) 100/47 (!) 104/52 (!) 111/53  Pulse: (!) 42 (!) 42 (!) 48 66  Resp: 20 20 20 18   Temp:   98 F (36.7 C) 97.8 F (36.6 C)  TempSrc:   Oral Oral  SpO2: 97% 96% 99% 100%  Weight:   78.5 kg   Height:   5' 2"  (1.575 m)     Intake/Output Summary (Last 24 hours) at 12/29/2018 0998 Last data filed at 12/29/2018 0600 Gross per 24 hour  Intake 733.33 ml  Output 350 ml  Net 383.33 ml   Filed Weights   12/29/18 0039  Weight: 78.5 kg   Examination: Physical Exam:   Constitutional: WN/WD obese Caucasian female currently in NAD and appears calm but is a little tremulous Eyes: Lids and conjunctivae normal, sclerae anicteric  ENMT: External Ears, Nose appear normal. Grossly normal hearing. Mucous membranes are moist. Neck: Appears normal, supple, no cervical masses, normal ROM, no appreciable thyromegaly; no JVD Respiratory: Diminished to auscultation bilaterally, no wheezing, rales, rhonchi or crackles. Normal respiratory effort and patient is not tachypenic. No accessory muscle use.  Cardiovascular: RRR, no murmurs / rubs / gallops. S1 and S2 auscultated. Trace LE extremity edema. 2+ pedal pulses. No carotid bruits.  Abdomen: Soft, Not particularly tender, Distended 2/2 body habitus. Musculoskeletal: No clubbing / cyanosis of digits/nails. No joint deformity upper and lower extremities. Rigth Leg in Brace and immobilizer Skin: No rashes, lesions, ulcers on a limited skin evaluation. No induration; Warm and dry.  Neurologic: CN 2-12 grossly intact with no focal deficits. Romberg sign and cerebellar reflexes not assessed.  Psychiatric: Normal judgment and insight. Alert and oriented x 2. Anxious mood and appropriate affect.   Data Reviewed: I have personally reviewed following labs and imaging studies  CBC: Recent Labs  Lab 12/28/18 1812  WBC 6.1  NEUTROABS 3.8  HGB 11.8*  HCT 36.1  MCV 95.8  PLT 338*   Basic Metabolic Panel: Recent Labs  Lab 12/28/18 1812 12/29/18 0413  NA 131* 136  K 5.5* 5.0  CL 102 105  CO2 19* 19*  GLUCOSE 188* 103*  BUN 34* 34*  CREATININE 1.57* 1.32*  CALCIUM 9.2 8.9   GFR: Estimated Creatinine Clearance: 38.5 mL/min (A) (by C-G formula based on SCr of 1.32 mg/dL (H)). Liver Function Tests: Recent Labs  Lab 12/28/18 1812 12/29/18 0413  AST 34  --   ALT 32  --   ALKPHOS 97  --   BILITOT 1.6* 1.4*  PROT 6.4*  --   ALBUMIN 3.3*  --    No results for input(s): LIPASE, AMYLASE in the last 168 hours. Recent  Labs  Lab 12/28/18 1813 12/29/18 0413  AMMONIA 63* 57*   Coagulation Profile: Recent Labs  Lab 12/28/18 1812  INR 1.2   Cardiac Enzymes: No results for input(s): CKTOTAL, CKMB, CKMBINDEX, TROPONINI in the last 168 hours. BNP (last 3 results) No results for input(s): PROBNP in the last 8760 hours. HbA1C: Recent Labs    12/28/18 2238  HGBA1C 6.3*   CBG: Recent Labs  Lab 12/29/18 0007 12/29/18 0729 12/29/18 0746  GLUCAP 123* 61* 76   Lipid Profile: No results for input(s): CHOL, HDL, LDLCALC, TRIG, CHOLHDL, LDLDIRECT in the last 72 hours. Thyroid Function Tests: Recent Labs  12/29/18 0413  TSH 3.562   Anemia Panel: No results for input(s): VITAMINB12, FOLATE, FERRITIN, TIBC, IRON, RETICCTPCT in the last 72 hours. Sepsis Labs: Recent Labs  Lab 12/28/18 1813 12/28/18 2240  LATICACIDVEN 2.5* 1.8    Recent Results (from the past 240 hour(s))  Urine culture     Status: None (Preliminary result)   Collection Time: 12/28/18  5:43 PM   Specimen: Urine, Random  Result Value Ref Range Status   Specimen Description   Final    URINE, RANDOM Performed at [facility] [address] [place], [place] [address]    Special Requests   Final    NONE Performed at [facility], [place] [address] [place], [place] [address]    Culture PENDING  Incomplete   Report Status PENDING  Incomplete   Radiology Studies: Ct Head Wo Contrast  Result Date: 12/28/2018 CLINICAL DATA:  Confusion.  Hallucinations. EXAM: CT HEAD WITHOUT CONTRAST TECHNIQUE: Contiguous axial images were obtained from the base of the skull through the vertex without intravenous contrast. COMPARISON:  Head CT 09/25/2018 FINDINGS: Brain: No acute intracranial hemorrhage. No focal mass lesion. No CT evidence of acute infarction. No midline shift or mass effect. No hydrocephalus. Basilar cisterns are patent. There are minimal periventricular and subcortical white matter hypodensities.  Minimal generalized cortical atrophy. Vascular: No hyperdense vessel or unexpected calcification. Skull: Normal. Negative for fracture or focal lesion. Sinuses/Orbits: Small volume fluid in the maxillary sinus. Mastoid air cells are clear. Orbits are clear. Other: None. IMPRESSION: 1. No acute intracranial findings. 2. Minimal cerebral atrophy and white matter microvascular disease. 3. Chronic maxillary sinusitis. Electronically Signed   By: Suzy Bouchard M.D.   On: 12/28/2018 18:25   Scheduled Meds: . heparin  5,000 Units Subcutaneous Q8H  . [START ON 12/30/2018] influenza vaccine adjuvanted  0.5 mL Intramuscular Tomorrow-1000  . insulin aspart  0-5 Units Subcutaneous QHS  . insulin aspart  0-9 Units Subcutaneous TID WC  . rifaximin  550 mg Oral BID   Continuous Infusions: . sodium chloride 125 mL/hr at 12/29/18 0008  . cefTRIAXone (ROCEPHIN)  IV      LOS: 1 day   Kerney Elbe, DO [facility] PAGER is on AMION  If 7PM-7AM, please contact night-coverage [email or web address] Password [email or web address] 12/29/2018, 8:19 AM

## 2018-12-29 NOTE — ED Notes (Signed)
ED TO INPATIENT HANDOFF REPORT  Name/Age/Gender [name] 73 y.o. female  Code Status    Code Status Orders  (From admission, onward)         Start     Ordered   12/28/18 2238  Full code  Continuous     12/28/18 2240        Code Status History    Date Active Date Inactive Code Status Order ID Comments User Context   11/22/2018 1903 12/07/2018 1446 Full Code [id number]  Norman Herrlich Inpatient   10/11/2018 0251 10/14/2018 2136 Full Code [id number]  Toy Baker, MD Inpatient   09/24/2018 0334 09/30/2018 1931 Full Code [id number]  Nicholes Stairs, MD ED   09/22/2018 1046 09/23/2018 2001 Full Code [id number]  Maurice March, PA-C Inpatient   Advance Care Planning Activity      Home/SNF/Other SNF  Chief Complaint possible stroke; dr wants her admitted to the hospital  Level of Care/Admitting Diagnosis ED Disposition    ED Disposition Condition [place]: [place] [[id number]]  Level of Care: Telemetry [5]  Admit to tele based on following criteria: Complex arrhythmia (Bradycardia/Tachycardia)  Covid Evaluation: Asymptomatic Screening Protocol (No Symptoms)  Diagnosis: Hepatic encephalopathy ([place]) [572.2.ICD-9-CM]  Admitting Physician: Shela Leff [id number]  Attending Physician: Shela Leff [id number]  Estimated length of stay: past midnight tomorrow  Certification:: I certify this patient will need inpatient services for at least 2 midnights  PT Class (Do Not Modify): Inpatient [101]  PT Acc Code (Do Not Modify): Private [1]       Medical History Past Medical History:  Diagnosis Date  . Acute and subacute hepatic failure without coma   . Allergy   . Anasarca   . Anxiety   . Arthritis   . Ascites 09/2018   no SBP on 850 cc tap 10/13/18  . Cirrhosis of liver ([place]) ~ 2004   from NAFLD.  Dx ~ 2004.  Dr Dorcas Mcmurray at [facility]/    . Depression   . Diabetes mellitus without complication ([place])    type 2   . Eczema of both hands   . Endometrial cancer ([place]) 1996, 2003   hysterectomy 1996.  recurrence 2003, treated with radiation.    . Generalized edema   . GI hemorrhage   . Headache   . 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 in [place]  . Hyperlipidemia   . Hypertension   . Muscle weakness   . Seizures ([place])    last seizure June 07, 2014   . Thrombocytopenia ([place])   . Thyroid nodule     Allergies Allergies  Allergen Reactions  . Asa [Aspirin] Other (See Comments)    Pt not allergic but cannot take due to bleeding   . Other Other (See Comments)    Any jewelry metal-hives and itching     IV Location/Drains/Wounds Patient Lines/Drains/Airways Status   Active Line/Drains/Airways    Name:   Placement date:   Placement time:   Site:   Days:   Peripheral IV 12/28/18 Left;Upper Forearm   12/28/18    1811    Forearm   1   Incision (Closed) 11/22/18 Knee Right   11/22/18    1700     37          Labs/Imaging Results for orders placed or performed during the hospital encounter of 12/28/18 (from the past 48 hour(s))  Urinalysis, Routine w reflex microscopic     Status: Abnormal   Collection Time: 12/28/18  5:43 PM  Result Value Ref Range   Color, Urine BROWN (A) YELLOW   APPearance CLOUDY (A) CLEAR   Specific Gravity, Urine 1.016 1.005 - 1.030   pH 5.0 5.0 - 8.0   Glucose, UA NEGATIVE NEGATIVE mg/dL   Hgb urine dipstick LARGE (A) NEGATIVE   Bilirubin Urine NEGATIVE NEGATIVE   Ketones, ur NEGATIVE NEGATIVE mg/dL   Protein, ur 100 (A) NEGATIVE mg/dL   Nitrite NEGATIVE NEGATIVE   Leukocytes,Ua MODERATE (A) NEGATIVE   RBC / HPF >50 (H) 0 - 5 RBC/hpf   WBC, UA >50 (H) 0 - 5 WBC/hpf   Bacteria, UA MANY (A) NONE SEEN   WBC Clumps PRESENT    Mucus PRESENT     Comment: Performed at [facility], [place] [address] [place], [place] [address]  Comprehensive metabolic panel     Status: Abnormal    Collection Time: 12/28/18  6:12 PM  Result Value Ref Range   Sodium 131 (L) 135 - 145 mmol/L   Potassium 5.5 (H) 3.5 - 5.1 mmol/L   Chloride 102 98 - 111 mmol/L   CO2 19 (L) 22 - 32 mmol/L   Glucose, Bld 188 (H) 70 - 99 mg/dL   BUN 34 (H) 8 - 23 mg/dL   Creatinine, Ser 1.57 (H) 0.44 - 1.00 mg/dL   Calcium 9.2 8.9 - 10.3 mg/dL   Total Protein 6.4 (L) 6.5 - 8.1 g/dL   Albumin 3.3 (L) 3.5 - 5.0 g/dL   AST 34 15 - 41 U/L   ALT 32 0 - 44 U/L   Alkaline Phosphatase 97 38 - 126 U/L   Total Bilirubin 1.6 (H) 0.3 - 1.2 mg/dL   GFR calc non Af Amer 33 (L) >60 mL/min   GFR calc Af Amer 38 (L) >60 mL/min   Anion gap 10 5 - 15    Comment: Performed at [facility], [place] [address] [place], [place] [address]  CBC with Differential     Status: Abnormal   Collection Time: 12/28/18  6:12 PM  Result Value Ref Range   WBC 6.1 4.0 - 10.5 K/uL   RBC 3.77 (L) 3.87 - 5.11 MIL/uL   Hemoglobin 11.8 (L) 12.0 - 15.0 g/dL   HCT 36.1 36.0 - 46.0 %   MCV 95.8 80.0 - 100.0 fL   MCH 31.3 26.0 - 34.0 pg   MCHC 32.7 30.0 - 36.0 g/dL   RDW 16.3 (H) 11.5 - 15.5 %   Platelets 116 (L) 150 - 400 K/uL    Comment: PLATELET COUNT CONFIRMED BY SMEAR RESULTS CONFIRMED BY MANUAL DILUTION Immature Platelet Fraction may be clinically indicated, consider ordering this additional test [id number]    nRBC 0.0 0.0 - 0.2 %   Neutrophils Relative % 62 %   Neutro Abs 3.8 1.7 - 7.7 K/uL   Lymphocytes Relative 20 %   Lymphs Abs 1.2 0.7 - 4.0 K/uL   Monocytes Relative 15 %   Monocytes Absolute 0.9 0.1 - 1.0 K/uL   Eosinophils Relative 1 %   Eosinophils Absolute 0.1 0.0 - 0.5 K/uL   Basophils Relative 1 %   Basophils Absolute 0.1 0.0 - 0.1 K/uL   Immature Granulocytes 1 %   Abs Immature Granulocytes 0.03 0.00 - 0.07 K/uL   Ovalocytes PRESENT     Comment: Performed at [facility], [address] [place],  [place] [address]  Protime-INR     Status: None   Collection Time: 12/28/18   6:12 PM  Result Value Ref Range   Prothrombin Time 15.0 11.4 - 15.2 seconds   INR 1.2 0.8 - 1.2    Comment: (NOTE) INR goal varies based on device and disease states. Performed at [facility], [place] [address] [place], [place] [address]   Lactic acid, plasma     Status: Abnormal   Collection Time: 12/28/18  6:13 PM  Result Value Ref Range   Lactic Acid, Venous 2.5 (HH) 0.5 - 1.9 mmol/L    Comment: CRITICAL RESULT CALLED TO, READ BACK BY AND VERIFIED WITH: Jolaine Artist [id number] @ [place] Performed at [place] [address], [place], [place] [address]   Ammonia     Status: Abnormal   Collection Time: 12/28/18  6:13 PM  Result Value Ref Range   Ammonia 63 (H) 9 - 35 umol/L    Comment: Performed at [facility], [place] [address] [place], [place] [address]  Blood gas, venous (at [facility], not at [facility])     Status: Abnormal   Collection Time: 12/28/18  6:14 PM  Result Value Ref Range   pH, Ven 7.343 7.250 - 7.430   pCO2, Ven 37.9 (L) 44.0 - 60.0 mmHg   pO2, Ven 43.9 32.0 - 45.0 mmHg   Bicarbonate 20.0 20.0 - 28.0 mmol/L   Acid-base deficit 4.7 (H) 0.0 - 2.0 mmol/L   O2 Saturation 72.6 %   Patient temperature 98.6     Comment: Performed at [facility], [place] [address], [place], [place] [address]  Lactic acid, plasma     Status: None   Collection Time: 12/28/18 10:40 PM  Result Value Ref Range   Lactic Acid, Venous 1.8 0.5 - 1.9 mmol/L    Comment: Performed at [facility], [place] [address], [place], [place] [address]  CBG monitoring, ED     Status: Abnormal   Collection Time: 12/29/18 12:07 AM  Result Value Ref Range   Glucose-Capillary 123 (H) 70 - 99 mg/dL   Ct Head Wo Contrast  Result Date: 12/28/2018 CLINICAL DATA:  Confusion.  Hallucinations. EXAM: CT HEAD WITHOUT CONTRAST TECHNIQUE: Contiguous axial images were obtained from the base of the skull through the vertex  without intravenous contrast. COMPARISON:  Head CT 09/25/2018 FINDINGS: Brain: No acute intracranial hemorrhage. No focal mass lesion. No CT evidence of acute infarction. No midline shift or mass effect. No hydrocephalus. Basilar cisterns are patent. There are minimal periventricular and subcortical white matter hypodensities. Minimal generalized cortical atrophy. Vascular: No hyperdense vessel or unexpected calcification. Skull: Normal. Negative for fracture or focal lesion. Sinuses/Orbits: Small volume fluid in the maxillary sinus. Mastoid air cells are clear. Orbits are clear. Other: None. IMPRESSION: 1. No acute intracranial findings. 2. Minimal cerebral atrophy and white matter microvascular disease. 3. Chronic maxillary sinusitis. Electronically Signed   By: Suzy Bouchard M.D.   On: 12/28/2018 18:25    Pending Labs Unresulted Labs (From admission, onward)    Start     Ordered   12/29/18 [id number]  Basic metabolic panel  Tomorrow morning,   R     12/28/18 2240   12/29/18 0500  Bilirubin, fractionated(tot/dir/indir)  Tomorrow morning,   R     12/28/18 2240   12/29/18 0500  Ammonia  Tomorrow morning,   R     12/28/18 2240   12/29/18  0500  TSH  Tomorrow morning,   R     12/28/18 2240   12/29/18 0500  T4, free  Tomorrow morning,   R     12/28/18 2240   12/28/18 2238  Hemoglobin A1c  Once,   STAT    Comments: To assess prior glycemic control    12/28/18 2240   12/28/18 2235  SARS CORONAVIRUS 2 (TAT 6-24 HRS) Nasopharyngeal Nasopharyngeal Swab  (Asymptomatic/Tier 2 Patients Labs)  Once,   STAT    Question Answer Comment  Is this test for diagnosis or screening Screening   Symptomatic for COVID-19 as defined by CDC No   Hospitalized for COVID-19 No   Admitted to ICU for COVID-19 No   Previously tested for COVID-19 Yes   Resident in a congregate (group) care setting No   Employed in healthcare setting No   Pregnant No      12/28/18 2234   12/28/18 2154  Urine culture  ONCE - STAT,   STAT      12/28/18 2153          Vitals/Pain Today's Vitals   12/28/18 2200 12/28/18 2230 12/28/18 2332 12/29/18 0000  BP: (!) 112/44 (!) 112/49 (!) 103/56 (!) 100/47  Pulse: (!) 46 (!) 52 (!) 42 (!) 42  Resp: 17 18 20 20   Temp:      TempSrc:      SpO2: 98% 97% 97% 96%  PainSc:        Isolation Precautions No active isolations  Medications Medications  cefTRIAXone (ROCEPHIN) 1 g in sodium chloride 0.9 % 100 mL IVPB (has no administration in time range)  lactulose (CHRONULAC) 10 GM/15ML solution 20 g (has no administration in time range)  heparin injection 5,000 Units (has no administration in time range)  acetaminophen (TYLENOL) tablet 650 mg (has no administration in time range)    Or  acetaminophen (TYLENOL) suppository 650 mg (has no administration in time range)  insulin aspart (novoLOG) injection 0-9 Units (has no administration in time range)  insulin aspart (novoLOG) injection 0-5 Units (0 Units Subcutaneous Not Given 12/29/18 0010)  rifaximin (XIFAXAN) tablet 550 mg (has no administration in time range)  0.9 %  sodium chloride infusion ( Intravenous New Bag/Given 12/29/18 0008)  lactulose (CHRONULAC) 10 GM/15ML solution 30 g (30 g Oral Given 12/28/18 2003)  cefTRIAXone (ROCEPHIN) 1 g in sodium chloride 0.9 % 100 mL IVPB (0 g Intravenous Stopped 12/28/18 2257)  sodium chloride 0.9 % bolus 1,000 mL (0 mLs Intravenous Stopped 12/29/18 0008)    Mobility manual wheelchair

## 2018-12-30 ENCOUNTER — Encounter (HOSPITAL_COMMUNITY)
Admission: EM | Disposition: A | Discharge status: Skilled Nursing Facility | Payer: Self-pay | Attending: Internal Medicine

## 2018-12-30 ENCOUNTER — Inpatient Hospital Stay (HOSPITAL_COMMUNITY): Payer: Medicare Other | Admitting: Certified Registered Nurse Anesthetist

## 2018-12-30 ENCOUNTER — Encounter (HOSPITAL_COMMUNITY): Payer: Self-pay | Admitting: *Deleted

## 2018-12-30 DIAGNOSIS — E872 Acidosis: Secondary | ICD-10-CM

## 2018-12-30 DIAGNOSIS — B9689 Other specified bacterial agents as the cause of diseases classified elsewhere: Secondary | ICD-10-CM

## 2018-12-30 HISTORY — PX: ESOPHAGOGASTRODUODENOSCOPY (EGD) WITH PROPOFOL: SHX5813

## 2018-12-30 HISTORY — PX: FLEXIBLE SIGMOIDOSCOPY: SHX5431

## 2018-12-30 LAB — CBC WITH DIFFERENTIAL/PLATELET
Abs Immature Granulocytes: 0 10*3/uL (ref 0.00–0.07)
Basophils Absolute: 0 10*3/uL (ref 0.0–0.1)
Basophils Relative: 1 %
Eosinophils Absolute: 0.1 10*3/uL (ref 0.0–0.5)
Eosinophils Relative: 2 %
HCT: 31.4 % — ABNORMAL LOW (ref 36.0–46.0)
Hemoglobin: 10 g/dL — ABNORMAL LOW (ref 12.0–15.0)
Immature Granulocytes: 0 %
Lymphocytes Relative: 28 %
Lymphs Abs: 0.7 10*3/uL (ref 0.7–4.0)
MCH: 30.5 pg (ref 26.0–34.0)
MCHC: 31.8 g/dL (ref 30.0–36.0)
MCV: 95.7 fL (ref 80.0–100.0)
Monocytes Absolute: 0.4 10*3/uL (ref 0.1–1.0)
Monocytes Relative: 17 %
Neutro Abs: 1.4 10*3/uL — ABNORMAL LOW (ref 1.7–7.7)
Neutrophils Relative %: 52 %
Platelets: 47 10*3/uL — ABNORMAL LOW (ref 150–400)
RBC: 3.28 MIL/uL — ABNORMAL LOW (ref 3.87–5.11)
RDW: 15.9 % — ABNORMAL HIGH (ref 11.5–15.5)
WBC: 2.6 10*3/uL — ABNORMAL LOW (ref 4.0–10.5)
nRBC: 0 % (ref 0.0–0.2)

## 2018-12-30 LAB — GLUCOSE, CAPILLARY
Glucose-Capillary: 70 mg/dL (ref 70–99)
Glucose-Capillary: 91 mg/dL (ref 70–99)
Glucose-Capillary: 174 mg/dL — ABNORMAL HIGH (ref 70–99)
Glucose-Capillary: 278 mg/dL — ABNORMAL HIGH (ref 70–99)
Glucose-Capillary: 233 mg/dL — ABNORMAL HIGH (ref 70–99)

## 2018-12-30 LAB — COMPREHENSIVE METABOLIC PANEL
ALT: 27 U/L (ref 0–44)
AST: 39 U/L (ref 15–41)
Albumin: 2.9 g/dL — ABNORMAL LOW (ref 3.5–5.0)
Alkaline Phosphatase: 84 U/L (ref 38–126)
Anion gap: 12 (ref 5–15)
BUN: 18 mg/dL (ref 8–23)
CO2: 17 mmol/L — ABNORMAL LOW (ref 22–32)
Calcium: 8.9 mg/dL (ref 8.9–10.3)
Chloride: 105 mmol/L (ref 98–111)
Creatinine, Ser: 1.01 mg/dL — ABNORMAL HIGH (ref 0.44–1.00)
GFR calc Af Amer: >60 mL/min (ref >60)
GFR calc non Af Amer: 56 mL/min — ABNORMAL LOW (ref >60)
Glucose, Bld: 74 mg/dL (ref 70–99)
Potassium: 4.7 mmol/L (ref 3.5–5.1)
Sodium: 134 mmol/L — ABNORMAL LOW (ref 135–145)
Total Bilirubin: 0.9 mg/dL (ref 0.3–1.2)
Total Protein: 5.4 g/dL — ABNORMAL LOW (ref 6.5–8.1)

## 2018-12-30 LAB — MAGNESIUM: Magnesium: 1.7 mg/dL (ref 1.7–2.4)

## 2018-12-30 LAB — PHOSPHORUS: Phosphorus: 3.5 mg/dL (ref 2.5–4.6)

## 2018-12-30 SURGERY — ESOPHAGOGASTRODUODENOSCOPY (EGD) WITH PROPOFOL
Anesthesia: Monitor Anesthesia Care

## 2018-12-30 MED ORDER — LORATADINE 10 MG PO TABS
10.0000 mg | ORAL_TABLET | Freq: Every day | ORAL | Status: DC
  Administered 2018-12-31 – 2019-01-06 (×6): 10 mg via ORAL
  Filled 2018-12-30 (×7): qty 1

## 2018-12-30 MED ORDER — FLUTICASONE PROPIONATE 50 MCG/ACT NA SUSP
1.0000 | Freq: Every day | NASAL | Status: DC
  Administered 2018-12-31 – 2019-01-06 (×7): 1 via NASAL
  Filled 2018-12-30: qty 16

## 2018-12-30 MED ORDER — LACTATED RINGERS IV SOLN
INTRAVENOUS | Status: DC
  Administered 2018-12-30 (×2): 1000 mL via INTRAVENOUS
  Administered 2019-01-03 (×3): via INTRAVENOUS

## 2018-12-30 MED ORDER — SODIUM BICARBONATE-DEXTROSE 150-5 MEQ/L-% IV SOLN
150.0000 meq | INTRAVENOUS | Status: DC
  Administered 2018-12-30 – 2018-12-31 (×3): 150 meq via INTRAVENOUS
  Filled 2018-12-30 (×4): qty 1000

## 2018-12-30 MED ORDER — METHOCARBAMOL 500 MG PO TABS
500.0000 mg | ORAL_TABLET | Freq: Four times a day (QID) | ORAL | Status: DC | PRN
  Administered 2018-12-31 – 2019-01-06 (×3): 500 mg via ORAL
  Filled 2018-12-30 (×3): qty 1

## 2018-12-30 MED ORDER — PROPOFOL 10 MG/ML IV BOLUS
INTRAVENOUS | Status: AC
  Filled 2018-12-30: qty 80

## 2018-12-30 MED ORDER — PROPOFOL 10 MG/ML IV BOLUS
INTRAVENOUS | Status: DC | PRN
  Administered 2018-12-30: 10 mg via INTRAVENOUS
  Administered 2018-12-30 (×3): 20 mg via INTRAVENOUS

## 2018-12-30 MED ORDER — PROPOFOL 500 MG/50ML IV EMUL
INTRAVENOUS | Status: DC | PRN
  Administered 2018-12-30: 125 ug/kg/min via INTRAVENOUS

## 2018-12-30 MED ORDER — SODIUM CHLORIDE 0.9 % IV SOLN: INTRAVENOUS | Status: DC

## 2018-12-30 MED ORDER — GABAPENTIN 300 MG PO CAPS
300.0000 mg | ORAL_CAPSULE | Freq: Two times a day (BID) | ORAL | Status: DC
  Administered 2018-12-30 – 2019-01-06 (×13): 300 mg via ORAL
  Filled 2018-12-30 (×16): qty 1

## 2018-12-30 MED ORDER — HYDROCODONE-ACETAMINOPHEN 5-325 MG PO TABS
1.0000 | ORAL_TABLET | Freq: Four times a day (QID) | ORAL | Status: DC | PRN
  Administered 2018-12-30 – 2019-01-02 (×3): 2 via ORAL
  Filled 2018-12-30 (×3): qty 2

## 2018-12-30 MED ORDER — ADULT MULTIVITAMIN W/MINERALS CH
1.0000 | ORAL_TABLET | Freq: Every day | ORAL | Status: DC
  Administered 2018-12-31 – 2019-01-06 (×6): 1 via ORAL
  Filled 2018-12-30 (×7): qty 1

## 2018-12-30 MED ORDER — BUPROPION HCL ER (XL) 150 MG PO TB24
150.0000 mg | ORAL_TABLET | Freq: Every day | ORAL | Status: DC
  Administered 2018-12-31 – 2019-01-06 (×6): 150 mg via ORAL
  Filled 2018-12-30 (×8): qty 1

## 2018-12-30 MED ORDER — DULOXETINE HCL 60 MG PO CPEP
60.0000 mg | ORAL_CAPSULE | Freq: Every day | ORAL | Status: DC
  Administered 2018-12-31 – 2019-01-06 (×6): 60 mg via ORAL
  Filled 2018-12-30 (×7): qty 1

## 2018-12-30 MED ORDER — ALPRAZOLAM 0.25 MG PO TABS
0.2500 mg | ORAL_TABLET | Freq: Two times a day (BID) | ORAL | Status: DC
  Administered 2018-12-30 – 2019-01-06 (×13): 0.25 mg via ORAL
  Filled 2018-12-30 (×14): qty 1

## 2018-12-30 MED ORDER — EPHEDRINE SULFATE-NACL 50-0.9 MG/10ML-% IV SOSY
PREFILLED_SYRINGE | INTRAVENOUS | Status: DC | PRN
  Administered 2018-12-30: 7.5 mg via INTRAVENOUS
  Administered 2018-12-30: 5 mg via INTRAVENOUS

## 2018-12-30 MED ORDER — MESALAMINE 1000 MG RE SUPP
1000.0000 mg | Freq: Every day | RECTAL | Status: DC
  Administered 2018-12-30 – 2019-01-05 (×7): 1000 mg via RECTAL
  Filled 2018-12-30 (×8): qty 1

## 2018-12-30 MED ORDER — MONTELUKAST SODIUM 10 MG PO TABS
10.0000 mg | ORAL_TABLET | Freq: Every day | ORAL | Status: DC
  Administered 2018-12-30 – 2019-01-05 (×7): 10 mg via ORAL
  Filled 2018-12-30 (×7): qty 1

## 2018-12-30 MED ORDER — POLYETHYLENE GLYCOL 3350 17 G PO PACK
17.0000 g | PACK | Freq: Two times a day (BID) | ORAL | Status: DC
  Administered 2018-12-30 (×2): 17 g via ORAL
  Filled 2018-12-30 (×5): qty 1

## 2018-12-30 SURGICAL SUPPLY — 14 items

## 2018-12-30 NOTE — Anesthesia Postprocedure Evaluation (Signed)
Anesthesia Post Note  Patient: Brittany Russo  Procedure(s) Performed: EGD (N/A ) FLEXIBLE SIGMOIDOSCOPY (N/A ) ([place]) ABLATION     Patient location during evaluation: PACU Anesthesia Type: MAC Level of consciousness: awake and alert Pain management: pain level controlled Vital Signs Assessment: post-procedure vital signs reviewed and stable Respiratory status: spontaneous breathing, nonlabored ventilation, respiratory function stable and patient connected to nasal cannula oxygen Cardiovascular status: stable and blood pressure returned to baseline Postop Assessment: no apparent nausea or vomiting Anesthetic complications: no    Last Vitals:  Vitals:   12/30/18 0840 12/30/18 0850  BP: (!) 134/44 (!) 131/45  Pulse: 76 77  Resp: 15 18  Temp:    SpO2: 100% 100%    Last Pain:  Vitals:   12/30/18 0831  TempSrc: Oral  PainSc: 0-No pain                 Effie Berkshire

## 2018-12-30 NOTE — NC FL2 (Signed)
[place] LEVEL OF CARE SCREENING TOOL     IDENTIFICATION  Patient Name: Brittany Russo Birthdate: [birth year] Sex: female Admission Date (Current Location): 12/28/2018  [facility] and [place] Number:  Herbalist and Address:  [facility],  [place], [place]      Provider Number: [id number]  Attending Physician Name and Address:  Kerney Elbe, DO  Relative Name and Phone Number:  Hartley Barefoot [phone]    Current Level of Care: Hospital Recommended Level of Care: [place] Prior Approval Number:    Date Approved/Denied:   PASRR Number: [id number]  Discharge Plan: SNF    Current Diagnoses: Patient Active Problem List   Diagnosis Date Noted  . Hepatic encephalopathy ([place]) 12/28/2018  . UTI (urinary tract infection) 12/28/2018  . Bradycardia 12/28/2018  . Hyperkalemia 12/28/2018  . Hematuria 11/23/2018  . Overweight (BMI 25.0-29.9) 11/23/2018  . Mechanical problem with extremity 11/22/2018  . Cirrhosis of liver with ascites ([place]) 11/09/2018  . Nausea without vomiting 10/26/2018  . Ascites   . Rectal bleeding   . AKI (acute kidney injury) ([place]) 10/11/2018  . Lower GI bleed 10/10/2018  . Anasarca 10/10/2018  . Hypoalbuminemia 10/10/2018  . Depression 09/28/2018  . Acute hepatic encephalopathy 09/28/2018  . Cirrhosis ([place]) 09/28/2018  . Periprosthetic fracture around internal prosthetic right knee joint 09/24/2018  . S/P right TKA 09/22/2018  . Status post total knee replacement, right 09/22/2018  . DM2 (diabetes mellitus, type 2) ([place]) 07/11/2015  . Hypertension 07/11/2015  . Hyperlipidemia 07/11/2015    Orientation RESPIRATION BLADDER Height & Weight     Self, Time, Situation, Place  Normal External catheter Weight: 78.5 kg Height:  5\' 2"  (157.5 cm)  BEHAVIORAL SYMPTOMS/MOOD NEUROLOGICAL BOWEL NUTRITION STATUS      Continent Diet(Regular diet)  AMBULATORY STATUS COMMUNICATION OF  NEEDS Skin   Extensive Assist Verbally Other (Comment)(Ecchymosis on skin)                       Personal Care Assistance Level of Assistance  Bathing, Feeding, Dressing Bathing Assistance: Limited assistance Feeding assistance: Independent Dressing Assistance: Limited assistance     Functional Limitations Info  Sight, Hearing, Speech Sight Info: Adequate Hearing Info: Adequate Speech Info: Adequate    SPECIAL CARE FACTORS FREQUENCY  PT (By licensed PT), OT (By licensed OT)     PT Frequency: x5 OT Frequency: x5            Contractures Contractures Info: Not present    Additional Factors Info  Code Status, Allergies, Psychotropic Code Status Info: FULL Allergies Info: [place] Aspirin, Other Psychotropic Info: Xanax, Celexa         Current Medications (12/30/2018):  This is the current hospital active medication list Current Facility-Administered Medications  Medication Dose Route Frequency Provider Last Rate Last Dose  . acetaminophen (TYLENOL) tablet 650 mg  650 mg Oral Q6H PRN Clarene Essex, MD   650 mg at 12/30/18 0025   Or  . acetaminophen (TYLENOL) suppository 650 mg  650 mg Rectal Q6H PRN Clarene Essex, MD      . cefTRIAXone (ROCEPHIN) 1 g in sodium chloride 0.9 % 100 mL IVPB  1 g Intravenous Q24H Clarene Essex, MD 200 mL/hr at 12/29/18 2306 1 g at 12/29/18 2306  . insulin aspart (novoLOG) injection 0-5 Units  0-5 Units Subcutaneous QHS Magod, Altamese Dilling, MD      . insulin aspart (novoLOG) injection 0-9 Units  0-9 Units Subcutaneous TID WC Clarene Essex, MD   2 Units at 12/30/18 1148  . lactated ringers infusion   Intravenous Continuous Clarene Essex, MD   Stopped at 12/30/18 [phone]  . lactulose (CHRONULAC) 10 GM/15ML solution 20 g  20 g Oral TID PRN Clarene Essex, MD      . mesalamine (CANASA) suppository 1,000 mg  1,000 mg Rectal QHS Magod, Altamese Dilling, MD      . polyethylene glycol (MIRALAX / GLYCOLAX) packet 17 g  17 g Oral BID Clarene Essex, MD   17 g at 12/30/18 0936  . rifaximin  (XIFAXAN) tablet 550 mg  550 mg Oral BID Clarene Essex, MD   550 mg at 12/30/18 0936  . sodium bicarbonate 150 mEq in dextrose 5% 1000 mL infusion  150 mEq Intravenous Continuous Raiford Noble Wadsworth, DO 75 mL/hr at 12/30/18 1152 150 mEq at 12/30/18 1152     Discharge Medications: Please see discharge summary for a list of discharge medications.  Relevant Imaging Results:  Relevant Lab Results:   Additional Information [phone]  Purcell Mouton, RN

## 2018-12-30 NOTE — Transfer of Care (Signed)
Immediate Anesthesia Transfer of Care Note  Patient: Brittany Russo  Procedure(s) Performed: EGD (N/A ) FLEXIBLE SIGMOIDOSCOPY (N/A )  Patient Location: Endoscopy Unit  Anesthesia Type:MAC  Level of Consciousness: drowsy and patient cooperative  Airway & Oxygen Therapy: Patient Spontanous Breathing and Patient connected to nasal cannula oxygen  Post-op Assessment: Report given to RN and Post -op Vital signs reviewed and stable  Post vital signs: Reviewed and stable  Last Vitals:  Vitals Value Taken Time  BP    Temp    Pulse 74 12/30/18 0831  Resp 15 12/30/18 0831  SpO2 99 % 12/30/18 0831  Vitals shown include unvalidated device data.  Last Pain:  Vitals:   12/30/18 0659  TempSrc: Oral  PainSc: 4       Patients Stated Pain Goal: 1 (62/13/08 6578)  Complications: No apparent anesthesia complications

## 2018-12-30 NOTE — Op Note (Signed)
[facility] Patient Name: Brittany Russo Procedure Date: 12/30/2018 MRN: [id number] Attending MD: Clarene Essex , MD Date of Birth: May 04, 1945 CSN: [id number] Age: 73 Admit Type: Inpatient Procedure:                Flexible Sigmoidoscopy Indications:              Hematochezia history of radiation proctitis Providers:                Clarene Essex, MD, Cleda Daub, RN, William Dalton,                            Technician Referring MD:              Medicines:                Propofol total dose [id number] mg IV Complications:            No immediate complications. Estimated Blood Loss:     Estimated blood loss: none. Procedure:                Pre-Anesthesia Assessment:                           - Prior to the procedure, a History and Physical                            was performed, and patient medications and                            allergies were reviewed. The patient's tolerance of                            previous anesthesia was also reviewed. The risks                            and benefits of the procedure and the sedation                            options and risks were discussed with the patient.                            All questions were answered, and informed consent                            was obtained. Prior Anticoagulants: The patient has                            taken no previous anticoagulant or antiplatelet                            agents. ASA Grade Assessment: III - A patient with                            severe systemic disease. After reviewing the risks  and benefits, the patient was deemed in                            satisfactory condition to undergo the procedure.                           - Prior to the procedure, a History and Physical                            was performed, and patient medications and                            allergies were reviewed. The patient's tolerance of   previous anesthesia was also reviewed. The risks                            and benefits of the procedure and the sedation                            options and risks were discussed with the patient.                            All questions were answered, and informed consent                            was obtained. Prior Anticoagulants: The patient has                            taken no previous anticoagulant or antiplatelet                            agents. ASA Grade Assessment: III - A patient with                            severe systemic disease. After reviewing the risks                            and benefits, the patient was deemed in                            satisfactory condition to undergo the procedure.                           After obtaining informed consent, the scope was                            passed under direct vision. The GIF-H190 [id number])                            Olympus gastroscope was introduced through the anus                            and advanced to the the sigmoid colon. The flexible  sigmoidoscopy was accomplished without difficulty                            except for the prep and her inability to hold air                            which required holding her rectum shot by the                            assistant. The patient tolerated the procedure well. Scope In: 8:05:14 AM Scope Out: 8:19:21 AM Total Procedure Duration: 0 hours 14 minutes 7 seconds  Findings:      A large amount of solid stool was found in the mid sigmoid colon,       interfering with visualization.      Oozing ulcerated mucosa with stigmata of recent bleeding were present in       the distal rectum. She also had some other areas of probable radiation       proctitis and we proceeded with focal radiofrequency ablation of her       radiation damage which was performed. With the endoscope in place,       Endoscopic visualization identified an  ablation site. The radiofrequency       channel ablation catheter was introduced through the endoscope working       channel. . The endoscope with the ablation catheter was advanced to the       areas radiation mucosa. The endoscope with the channel ablation catheter       was positioned under direct visualization so that the catheter was       placed in contact with the surface of the radiation mucosa. Energy was       applied x11 bursts only. The ablation catheter was removed through the       endoscope working channel. The areas of the rectum mucosa had been       ablated were examined and no further oozing was seen.      The exam was otherwise without abnormality. Impression:               - Stool in the mid sigmoid colon. No blood seen                            coming from above                           - Mucosal ulceration. Treated with radiofrequency                            ablation. Patible with radiation damage                           - The examination was otherwise normal.                           - No specimens collected. Moderate Sedation:      Not Applicable - Patient had care per Anesthesia. Recommendation:           - Soft diet today. Use MiraLAX twice a day to keep  stools soft                           - Use Canasa 1000 mg suppository 1 per rectum QHS                            indefinitely.                           - Refer to a urologist and probable gynecologist at                            appointment to be scheduled. There has been a                            question in the chart of a rectal bladder fistula                            and she does have blood in her urine and vagina and                            probably needs cystoscopy to evaluate the bladder                            damage                           - Repeat flexible sigmoidoscopy PRN for retreatment                            as needed.                            - Telephone GI clinic if symptomatic PRN. Possibly                            she would need a diverting colostomy and depending                            on above urinary diversion in order to allow this                            damage to heal we will asked my partner to check on                            tomorrow Procedure Code(s):        --- Professional ---                           [phone], Sigmoidoscopy, flexible; diagnostic,                            including collection of specimen(s) by brushing or  washing, when performed (separate procedure) Diagnosis Code(s):        --- Professional ---                           K63.3, Ulcer of intestine                           K92.1, Melena (includes Hematochezia) CPT copyright 2019 American Medical Association. All rights reserved. The codes documented in this report are preliminary and upon coder review may  be revised to meet current compliance requirements. Clarene Essex, MD 12/30/2018 8:48:31 AM This report has been signed electronically. Number of Addenda: 0

## 2018-12-30 NOTE — Anesthesia Preprocedure Evaluation (Addendum)
Anesthesia Evaluation  Patient identified by MRN, date of birth, ID band Patient awake    Reviewed: Allergy & Precautions, NPO status , Patient's Chart, lab work & pertinent test results  Airway Mallampati: II  TM Distance: >3 FB Neck ROM: Full    Dental  (+) Missing,    Pulmonary    breath sounds clear to auscultation       Cardiovascular hypertension, Pt. on home beta blockers  Rhythm:Regular Rate:Normal     Neuro/Psych  Headaches, Seizures -,  Anxiety Depression    GI/Hepatic negative GI ROS, (+) Cirrhosis       ,   Endo/Other  diabetes, Type 2, Oral Hypoglycemic Agents  Renal/GU Renal disease     Musculoskeletal  (+) Arthritis ,   Abdominal (+) + obese,   Peds  Hematology negative hematology ROS (+)   Anesthesia Other Findings   Reproductive/Obstetrics                            Anesthesia Physical Anesthesia Plan  ASA: III  Anesthesia Plan: MAC   Post-op Pain Management:    Induction: Intravenous  PONV Risk Score and Plan: 0 and Propofol infusion  Airway Management Planned: Natural Airway and Nasal Cannula  Additional Equipment: None  Intra-op Plan:   Post-operative Plan:   Informed Consent: I have reviewed the patients History and Physical, chart, labs and discussed the procedure including the risks, benefits and alternatives for the proposed anesthesia with the patient or authorized representative who has indicated his/her understanding and acceptance.       Plan Discussed with: CRNA  Anesthesia Plan Comments:        Anesthesia Quick Evaluation

## 2018-12-30 NOTE — TOC Progression Note (Signed)
Transition of Care [facility]) - Progression Note    Patient Details  Name: Brittany Russo MRN: [id number] Date of Birth: November 05, 1945  Transition of Care [facility]) CM/SW Contact  Purcell Mouton, RN Phone Number: 12/30/2018, 4:58 PM  Clinical Narrative:    Spoke with pt's daughter who request pt to go to [facility] and not back to [facility].         Expected Discharge Plan and Services                                                 Social Determinants of Health (SDOH) Interventions    Readmission Risk Interventions No flowsheet data found.

## 2018-12-30 NOTE — Op Note (Signed)
[facility] Patient Name: Brittany Russo Procedure Date: 12/30/2018 MRN: [id number] Attending MD: Clarene Essex , MD Date of Birth: [birth year] CSN: [id number] Age: 73 Admit Type: Inpatient Procedure:                Upper GI endoscopy Indications:              Iron deficiency anemia secondary to chronic blood                            loss, Hematochezia, Cirrhosis rule out esophageal                            varices Providers:                Clarene Essex, MD, Cleda Daub, RN, William Dalton,                            Technician Referring MD:              Medicines:                Propofol total dose [id number] mg IV Complications:            No immediate complications. Estimated Blood Loss:     Estimated blood loss: none. Procedure:                Pre-Anesthesia Assessment:                           - Prior to the procedure, a History and Physical                            was performed, and patient medications and                            allergies were reviewed. The patient's tolerance of                            previous anesthesia was also reviewed. The risks                            and benefits of the procedure and the sedation                            options and risks were discussed with the patient.                            All questions were answered, and informed consent                            was obtained. Prior Anticoagulants: The patient has                            taken no previous anticoagulant or antiplatelet                            agents. ASA  Grade Assessment: III - A patient with                            severe systemic disease. After reviewing the risks                            and benefits, the patient was deemed in                            satisfactory condition to undergo the procedure.                           After obtaining informed consent, the endoscope was                            passed under direct vision.  Throughout the                            procedure, the patient's blood pressure, pulse, and                            oxygen saturations were monitored continuously. The                            GIF-H190 [id number]) Olympus gastroscope was                            introduced through the mouth, and advanced to the                            third part of duodenum. The upper GI endoscopy was                            accomplished without difficulty. The patient                            tolerated the procedure well. Scope In: Scope Out: Findings:      The larynx was normal.      A small hiatal hernia was present.      A single diminutive semi-sessile polyp was found in the cardia.      Diffuse minimal inflammation characterized by congestion (edema) was       found in the gastric body.      The duodenal bulb, first portion of the duodenum, second portion of the       duodenum and third portion of the duodenum were normal.      The exam was otherwise without abnormality. Impression:               - Normal larynx.                           - Small hiatal hernia.                           - A single gastric polyp.                           -  Atrophic gastritis.                           - Normal duodenal bulb, first portion of the                            duodenum, second portion of the duodenum and third                            portion of the duodenum.                           - The examination was otherwise normal. And                            specifically no signs of esophageal or gastric                            varices and no signs of portal gastropathy                           - No specimens collected. Moderate Sedation:      Not Applicable - Patient had care per Anesthesia. Recommendation:           - Soft diet today.                           - Continue present medications.                           - Perform a flexible sigmoidoscopy for further                             evaluation of the rectum and sigmoid colon today. Procedure Code(s):        --- Professional ---                           [phone], Esophagogastroduodenoscopy, flexible,                            transoral; diagnostic, including collection of                            specimen(s) by brushing or washing, when performed                            (separate procedure) Diagnosis Code(s):        --- Professional ---                           K44.9, Diaphragmatic hernia without obstruction or                            gangrene                           K31.7, Polyp of stomach  and duodenum                           K29.40, Chronic atrophic gastritis without bleeding                           D50.0, Iron deficiency anemia secondary to blood                            loss (chronic)                           K92.1, Melena (includes Hematochezia)                           K74.60, Unspecified cirrhosis of liver CPT copyright 2019 American Medical Association. All rights reserved. The codes documented in this report are preliminary and upon coder review may  be revised to meet current compliance requirements. Clarene Essex, MD 12/30/2018 8:36:03 AM This report has been signed electronically. Number of Addenda: 0

## 2018-12-30 NOTE — Progress Notes (Signed)
[name] 7:41 AM  Subjective: Patient without any new complaints did have some fresh blood with her enema  Objective: Vital signs stable afebrile exam please see preassessment evaluation chemistry stable decreased BUN and creatinine slight decreased hemoglobin as well as platelet count and white count  Assessment: Cirrhosis and bright red blood per rectum probably radiation proctitis  Plan: Okay to proceed with EGD and flex sig with anesthesia assistance  [facility]  office [phone] After 5PM or if no answer call [phone]

## 2018-12-30 NOTE — Progress Notes (Signed)
PROGRESS NOTE    Brittany Russo  DJT:[id number] DOB: January 08, 1946 DOA: 12/28/2018 PCP: Rosalee Kaufman, PA-C   Brief Narrative:  HPI per Dr. Shela Leff on 12/28/2018 Brittany Russo is a 73 y.o. female with medical history significant of liver cirrhosis secondary to NAFLD, insulin-dependent diabetes mellitus, depression, anxiety, hypertension, hyperlipidemia, seizure disorder, recent right knee extensor mechanism repair on 11/22/2018 presenting to the hospital from her SNF for evaluation of AMS.  Patient is confused and slow to respond to questions.  She is not sure why she is here.  Oriented to self and knows it is October 2020.  Not oriented to place.  Denies fevers, chest pain, shortness of breath, cough, nausea, vomiting, abdominal pain, or dysuria.  No additional history could be obtained from her.  ED Course: Bradycardic with heart rate in the 40s.  Not hypotensive.  Afebrile and no leukocytosis.  Lactic acid 2.5.  Ammonia level 63, elevated compared to prior labs.  Hemoglobin 11.8, above baseline.  Platelet count 116,000, has chronic thrombocytopenia and platelet count is currently improved from baseline.  Sodium 131, chronically low and at baseline.  Potassium 5.5.  Bicarb 19, anion gap 10.  Blood glucose 188.  BUN 34, creatinine 1.5.  Creatinine was 0.7 on 9/13.  T bili 1.6, remainder of LFTs normal.  INR 1.2.  UA with large amount of leukocytes, greater than 50 RBCs, greater than 50 WBCs, and many bacteria on microscopic examination.  Urine culture pending.  VBG with pH 7.34.  Head CT negative for acute finding. Patient received ceftriaxone, lactulose, and 1 L normal saline bolus.  **Interim History  Patient started having some hematuria and some melena so GI and urology were consulted.  Encephalopathy appears to be improving.  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  Dr.  Driscilla Grammes recommended a soft diet and recommended mesalamine suppositories.  Urology feels that the patient stable for outpatient cystoscopy and recommended no inpatient intervention at this time.  Assessment & Plan:   Principal Problem:   Hepatic encephalopathy ([place]) Active Problems:   AKI (acute kidney injury) ([place])   UTI (urinary tract infection)   Bradycardia   Hyperkalemia  Acute Hepatic Encephalopathy, improving  -History of liver cirrhosis secondary to NAFLD -Patient was confused and slow to respond to questions and on admission was Oriented to self and knows it is October 2020.  Not oriented to place and does not know why she is here.;  Mentation is much improved  -Ammonia level 63 on admission and was elevated compared to prior labs and is improved to 57 -No lactulose listed in home medications.  Only rifaximin is listed.   -Stroke less likely as head CT negative and neuro exam nonfocal.   -Does have a UTI with hematuria which is also likely contributing to the patient's encephalopathy.   -In addition, polypharmacy could also be contributing as Xanax, Norco, and Robaxin listed in home medications. -Lactulose started at 20 g po TIDprn to maintain 2-3 BM's daily  -Continue Rifaximin -Repeat ammonia level this AM showed improvement and was 57 -Treatment of UTI as mentioned below -Hold sedating medications -Continue to monitor mental status  UTI with Hematuria -Afebrile no leukocytosis.   -Lactic acid mildly elevated, dehydration could be contributing.   -UA with large amount of leukocytes, greater than 50 RBCs, greater than 50 WBCs, and many bacteria on microscopic examination. -Continue IV ceftriaxone -  Urine culture pending along with Blood Cx; Urine Cx grew 20,000 CFU of Citrobacter Braakii sensitivities were reintubated for better growth -Urology consulted for further evaluation given her Hematuria and Dr. Alyson Ingles evaluated yesterday and he requested the urine is sent for  cystology but likely meant cytology.  He recommends that she be scheduled for an outpatient cystoscopy because first hemoglobin is currently stable and she is emptying well we do not feel like any further intervention at this point was necessary for her gross hematuria  Melena with Clots  -Dr. Watt Climes was consulted for further evaluation recommendations and feels that this is likely secondary to radiation proctitis and he has discussed the risks and benefits and methods of endoscopy and flex sig  -EGD done and showed "Normal larynx. Small hiatal hernia. A single gastric polyp. Atrophic gastritis. Normal duodenal bulb, first portion of the duodenum, second portion of the duodenum and third portion of the duodenum." -Flex Sig done showed stool in the mid sigmoid colon with no blood seen coming from above.  There is also mucosal ulceration that was treated with radiofrequency ablation and that was palpable with radiation damage and the examination was otherwise normal -Continue to monitor for signs and symptoms of bleeding; continue to monitor hemoglobin and hematocrit and trend -Dr. Driscilla Grammes recommends a soft diet today as well as using MiraLAX twice a day to keep stools soft and recommending Canasa 1000 mg suppository per rectum nightly indefinitely  AKI, improving  -BUN 34, creatinine 1.57 on Admission.   -Creatinine was 0.7 on 9/13.  Likely prerenal secondary to dehydration and home diuretic use. -IV fluid hydration with NS at 75 mL/hr to be continued but with Sodium Bicarbonate -BUN/Cr is now 18/1.01 -Continue to monitor renal function -Monitor urine output -Hold diuretics for now -Repeat CMP in AM   Bradycardia, PR prolongation -Bradycardic with heart rate in the 40s.  Not hypotensive.  -Atenolol and Cardizem listed in home medications. -Cardiac monitoring -Hold atenolol and Cardizem for now  -Checked TSH and was 3.562 and free T4 was 1.18  Mild lactic acidosis -Lactic acid 2.5.   Suspect related to dehydration.  Does have a UTI but no fever or leukocytosis. -Received 1 L fluid bolus in the ED.  Continue IV fluid hydration with NS at 75 mL/hr -Continue to trend lactate  Mild hyperkalemia, improving  -Potassium 5.5 on admission.  Likely related to AKI and home medications (spironolactone and potassium supplement). -Cardiac monitoring -Hold spironolactone and potassium supplement -K+ is now 4.7 -Continue to monitor potassium level  Normal anion gap metabolic acidosis -Bicarb 19, anion gap 10 on Admission and now CO2 is now 17 and AG is now 12 with Chloride Level of 105.   -Likely related to home diuretic use and AKI. -Hold diuretic -IV fluid hydration changed to IV Sodium Bicarbonate -Continue to monitor  Hyperbilirubinemia -T bili was 1.6 on Admission.   -Does have a history of cirrhosis but transaminases are normal. -Checked fractionated bilirubin level and now T Bili is 1.4 and Direct is 0.4 and Indirect is 1.0 yesterday; Today T Bili is improved to 0.9 -Continue to Monitor and Trend -Repeat CMP in AM   Insulin-dependent diabetes mellitus -Checked A1c level and was 6.3.   -Sliding scale insulin sensitive and CBG checks. -CBG ranging from 91-278  Recent knee surgery Patient underwent right knee extensor mechanism repair on 11/22/2018.  Has a knee brace in place. -PT evaluation recommending SNF -Outpatient orthopedic follow-up with Dr. Alvan Dame  Hyponatremia -Had improved and  went from 131 -> 136 but is now 134 -C/w IVF Hydration but NS stopped and started on IV Sodium Bicarbonate -Continue to Monitor and Trend -Repeat CMP in AM   Thrombocytopenia -In the setting of liver cirrhosis and likely splenic sequestration but worsened in the setting of bleeding The patient platelet count went from 116,000 now 47,000 -Continue to monitor for signs and symptoms of bleeding and she is having some melena and some hematuria -Repeat CBC  Pancytopenia -From  Liver Cirrhosis and likely dilutional drop -WBC is now 2.6, Hgb/HCt is now 10.0/31.4, and Platelet Count is now 47,000 -Continue to Monitor and Trend  -Repeat CBC in AM   Obesity -Estimated body mass index is 31.65 kg/m as calculated from the following:   Height as of this encounter: 5' 2"  (1.575 m).   Weight as of this encounter: 78.5 kg. -Weight Loss and Dietary Counseling given   DVT prophylaxis: SCDs Code Status: FULL CODE Family Communication: No family present at bedside but spoke with daughter over the phone Disposition Plan: Pending further workup and improvement back to baseline; Needs SNF  Consultants:   Gastroenterology   Urology    Procedures:  EGD Findings:      The larynx was normal.      A small hiatal hernia was present.      A single diminutive semi-sessile polyp was found in the cardia.      Diffuse minimal inflammation characterized by congestion (edema) was       found in the gastric body.      The duodenal bulb, first portion of the duodenum, second portion of the       duodenum and third portion of the duodenum were normal.      The exam was otherwise without abnormality. Impression:               - Normal larynx.                           - Small hiatal hernia.                           - A single gastric polyp.                           - Atrophic gastritis.                           - Normal duodenal bulb, first portion of the                            duodenum, second portion of the duodenum and third                            portion of the duodenum.                           - The examination was otherwise normal. And                            specifically no signs of esophageal or gastric  varices and no signs of portal gastropathy                           - No specimens collected.  Flex Sigmoidoscopy Findings:      A large amount of solid stool was found in the mid sigmoid colon,       interfering with  visualization.      Oozing ulcerated mucosa with stigmata of recent bleeding were present in       the distal rectum. She also had some other areas of probable radiation       proctitis and we proceeded with focal radiofrequency ablation of her       radiation damage which was performed. With the endoscope in place,       Endoscopic visualization identified an ablation site. The radiofrequency       channel ablation catheter was introduced through the endoscope working       channel. . The endoscope with the ablation catheter was advanced to the       areas radiation mucosa. The endoscope with the channel ablation catheter       was positioned under direct visualization so that the catheter was       placed in contact with the surface of the radiation mucosa. Energy was       applied x11 bursts only. The ablation catheter was removed through the       endoscope working channel. The areas of the rectum mucosa had been       ablated were examined and no further oozing was seen.      The exam was otherwise without abnormality. Impression:               - Stool in the mid sigmoid colon. No blood seen                            coming from above                           - Mucosal ulceration. Treated with radiofrequency                            ablation. Patible with radiation damage                           - The examination was otherwise normal.                           - No specimens collected.  Antimicrobials:  Anti-infectives (From admission, onward)   Start     Dose/Rate Route Frequency Ordered Stop   12/29/18 2200  cefTRIAXone (ROCEPHIN) 1 g in sodium chloride 0.9 % 100 mL IVPB     1 g 200 mL/hr over 30 Minutes Intravenous Every 24 hours 12/28/18 2240     12/29/18 1000  rifaximin (XIFAXAN) tablet 550 mg     550 mg Oral 2 times daily 12/28/18 2240     12/28/18 2200  cefTRIAXone (ROCEPHIN) 1 g in sodium chloride 0.9 % 100 mL IVPB     1 g 200 mL/hr over 30 Minutes Intravenous  Once  12/28/18 2153 12/28/18 2257     Subjective: Patient examined at bedside and she is  much more awake and alert.  Denies any chest pain, answer dizziness.  No abdominal pain.  No nausea or vomiting.  Still complains of some knee pain.  No other concerns or points this time.  Spoke with her daughter and answered herquestions to the best of my ability but she really wanted to speak with the urologist.  Objective: Vitals:   12/30/18 0840 12/30/18 0850 12/30/18 0908 12/30/18 1347  BP: (!) 134/44 (!) 131/45 135/60 (!) 127/49  Pulse: 76 77 75 84  Resp: 15 18 16 18   Temp:   97.7 F (36.5 C) (!) 97.5 F (36.4 C)  TempSrc:   Oral Oral  SpO2: 100% 100% 100% 100%  Weight:      Height:        Intake/Output Summary (Last 24 hours) at 12/30/2018 1826 Last data filed at 12/30/2018 1600 Gross per 24 hour  Intake 860 ml  Output 950 ml  Net -90 ml   Filed Weights   12/29/18 0039 12/30/18 0659  Weight: 78.5 kg 78.5 kg   Examination: Physical Exam:  Constitutional: WN/WD, NAD and appears calm Eyes: Lids and conjunctivae normal, sclerae anicteric  ENMT: External Ears, Nose appear normal. Grossly normal hearing. Mucous membranes are moist.  Neck: Appears normal, supple, no cervical masses, normal ROM, no appreciable thyromegaly; no JVD Respiratory: Diminished to auscultation bilaterally, no wheezing, rales, rhonchi or crackles. Normal respiratory effort and patient is not tachypenic. No accessory muscle use.  Cardiovascular: RRR, no murmurs / rubs / gallops. S1 and S2 auscultated. Trace extremity edema. 2+ pedal pulses. No carotid bruits.  Abdomen: Soft, non-tender, Distended due to body habitus. No masses palpated. No appreciable hepatosplenomegaly. Bowel sounds positive x4.  GU: Deferred.  Musculoskeletal: Right Leg in brace and immobilizer Skin: No rashes, lesions, ulcers on a limited skin evaluation No induration; Warm and dry.  Neurologic: CN 2-12 grossly intact with no focal deficits. Romberg  sign and cerebellar reflexes not assessed.  Psychiatric: Normal judgment and insight. Alert and oriented x 3. Slightly depressed appearing mood and flat affect.   Data Reviewed: I have personally reviewed following labs and imaging studies  CBC: Recent Labs  Lab 12/28/18 1812 12/29/18 0413 12/30/18 0412  WBC 6.1 4.6 2.6*  NEUTROABS 3.8 2.6 1.4*  HGB 11.8* 11.4* 10.0*  HCT 36.1 35.9* 31.4*  MCV 95.8 96.8 95.7  PLT 116* 68* 47*   Basic Metabolic Panel: Recent Labs  Lab 12/28/18 1812 12/29/18 0413 12/30/18 0412  NA 131* 136 134*  K 5.5* 5.0 4.7  CL 102 105 105  CO2 19* 19* 17*  GLUCOSE 188* 103* 74  BUN 34* 34* 18  CREATININE 1.57* 1.32* 1.01*  CALCIUM 9.2 8.9 8.9  MG  --  2.0 1.7  PHOS  --  5.1* 3.5   GFR: Estimated Creatinine Clearance: 50.3 mL/min (A) (by C-G formula based on SCr of 1.01 mg/dL (H)). Liver Function Tests: Recent Labs  Lab 12/28/18 1812 12/29/18 0413 12/30/18 0412  AST 34  --  39  ALT 32  --  27  ALKPHOS 97  --  84  BILITOT 1.6* 1.4* 0.9  PROT 6.4*  --  5.4*  ALBUMIN 3.3*  --  2.9*   No results for input(s): LIPASE, AMYLASE in the last 168 hours. Recent Labs  Lab 12/28/18 1813 12/29/18 0413  AMMONIA 63* 57*   Coagulation Profile: Recent Labs  Lab 12/28/18 1812  INR 1.2   Cardiac Enzymes: No results for input(s): CKTOTAL, CKMB, CKMBINDEX, TROPONINI in  the last 168 hours. BNP (last 3 results) No results for input(s): PROBNP in the last 8760 hours. HbA1C: Recent Labs    12/28/18 2238  HGBA1C 6.3*   CBG: Recent Labs  Lab 12/29/18 2217 12/30/18 0705 12/30/18 0923 12/30/18 1139 12/30/18 1648  GLUCAP 142* 70 91 174* 278*   Lipid Profile: No results for input(s): CHOL, HDL, LDLCALC, TRIG, CHOLHDL, LDLDIRECT in the last 72 hours. Thyroid Function Tests: Recent Labs    12/29/18 0413  TSH 3.562  FREET4 1.18*   Anemia Panel: No results for input(s): VITAMINB12, FOLATE, FERRITIN, TIBC, IRON, RETICCTPCT in the last 72  hours. Sepsis Labs: Recent Labs  Lab 12/28/18 1813 12/28/18 2240  LATICACIDVEN 2.5* 1.8    Recent Results (from the past 240 hour(s))  Urine culture     Status: Abnormal (Preliminary result)   Collection Time: 12/28/18  5:43 PM   Specimen: Urine, Random  Result Value Ref Range Status   Specimen Description   Final    URINE, RANDOM Performed at [facility] [address], [place], [place] [address]    Special Requests   Final    NONE Performed at [facility], [place] [address] [place], [place] [address]    Culture (A)  Final    20,000 COLONIES/mL CITROBACTER BRAAKII CULTURE REINCUBATED FOR BETTER GROWTH Performed at [facility], [place] [address], [place], [place] [address]    Report Status PENDING  Incomplete  SARS CORONAVIRUS 2 (TAT 6-24 HRS) Nasopharyngeal Nasopharyngeal Swab     Status: None   Collection Time: 12/28/18 10:53 PM   Specimen: Nasopharyngeal Swab  Result Value Ref Range Status   SARS Coronavirus 2 NEGATIVE NEGATIVE Final    Comment: (NOTE) SARS-CoV-2 target nucleic acids are NOT DETECTED. The SARS-CoV-2 RNA is generally detectable in upper and lower respiratory specimens during the acute phase of infection. Negative results do not preclude SARS-CoV-2 infection, do not rule out co-infections with other pathogens, and should not be used as the sole basis for treatment or other patient management decisions. Negative results must be combined with clinical observations, patient history, and epidemiological information. The expected result is Negative. Fact Sheet for Patients: SugarRoll.be Fact Sheet for Healthcare Providers: https://www.woods-mathews.com/ This test is not yet approved or cleared by the [place] FDA and  has been authorized for detection and/or diagnosis of SARS-CoV-2 by FDA under an Emergency Use Authorization (EUA). This EUA will remain  in effect (meaning this  test can be used) for the duration of the COVID-19 declaration under Section 56 4(b)(1) of the Act, 21 U.S.C. section 360bbb-3(b)(1), unless the authorization is terminated or revoked sooner. Performed at [facility], [place] [address], [place], [place] [address]   MRSA PCR Screening     Status: None   Collection Time: 12/29/18  7:41 AM   Specimen: Nasal Mucosa; Nasopharyngeal  Result Value Ref Range Status   MRSA by PCR NEGATIVE NEGATIVE Final    Comment:        The GeneXpert MRSA Assay (FDA approved for NASAL specimens only), is one component of a comprehensive MRSA colonization surveillance program. It is not intended to diagnose MRSA infection nor to guide or monitor treatment for MRSA infections. Performed at [facility], [place] [address] [place], [place] [address]   Culture, blood (routine x 2)     Status: None (Preliminary result)   Collection Time: 12/29/18  9:08 AM   Specimen: BLOOD RIGHT HAND  Result  Value Ref Range Status   Specimen Description   Final    BLOOD RIGHT HAND Performed at [facility] [address] [place], [place] [address]    Special Requests   Final    BOTTLES DRAWN AEROBIC ONLY Blood Culture adequate volume Performed at [facility] [address] [place], [place] [address]    Culture   Final    NO GROWTH 1 DAY Performed at [facility], [place] [address] [place], [place] [address]    Report Status PENDING  Incomplete  Culture, blood (routine x 2)     Status: None (Preliminary result)   Collection Time: 12/29/18  9:08 AM   Specimen: BLOOD LEFT HAND  Result Value Ref Range Status   Specimen Description   Final    BLOOD LEFT HAND Performed at [facility] [address] [place], [place] [address]    Special Requests   Final    BOTTLES DRAWN AEROBIC ONLY Blood Culture results may not be optimal due to an inadequate volume of blood received in culture  bottles Performed at [facility] [address] [place], [place] [address]    Culture   Final    NO GROWTH 1 DAY Performed at [facility], [place] [address], [place],  [address]    Report Status PENDING  Incomplete   Radiology Studies: No results found. Scheduled Meds: . insulin aspart  0-5 Units Subcutaneous QHS  . insulin aspart  0-9 Units Subcutaneous TID WC  . mesalamine  1,000 mg Rectal QHS  . polyethylene glycol  17 g Oral BID  . rifaximin  550 mg Oral BID   Continuous Infusions: . cefTRIAXone (ROCEPHIN)  IV 1 g (12/29/18 2306)  . lactated ringers Stopped (12/30/18 0853)  . sodium bicarbonate 150 mEq in dextrose 5% 1000 mL 150 mEq (12/30/18 1152)    LOS: 2 days   Kerney Elbe, DO [facility] PAGER is on [place]  If 7PM-7AM, please contact night-coverage [email or web address] Password [facility] 12/30/2018, 6:26 PM

## 2018-12-30 NOTE — TOC Initial Note (Signed)
Transition of Care [facility]) - Initial/Assessment Note    Patient Details  Name: Brittany Russo MRN: [id number] Date of Birth: [birth year]  [facility] CM/SW Contact:    Purcell Mouton, RN Phone Number: 12/30/2018, 5:02 PM  Clinical Narrative:                 FL2 completed and faxed to [facility].  Expected Discharge Plan: [place]     Patient Goals and CMS Choice Patient states their goals for this hospitalization and ongoing recovery are:: to get better   Choice offered to / list presented to : Adult Children(Daughter asked for [facility])  Expected Discharge Plan and Services Expected Discharge Plan: [place]   Discharge Planning Services: CM Consult   Living arrangements for the past 2 months: [place]                                      Prior Living Arrangements/Services Living arrangements for the past 2 months: [place] Lives with:: Facility Resident Patient language and need for interpreter reviewed:: No                 Activities of Daily Living Home Assistive Devices/Equipment: Wheelchair ADL Screening (condition at time of admission) Patient's cognitive ability adequate to safely complete daily activities?: Yes Is the patient deaf or have difficulty hearing?: No Does the patient have difficulty seeing, even when wearing glasses/contacts?: No Does the patient have difficulty concentrating, remembering, or making decisions?: No Patient able to express need for assistance with ADLs?: Yes Does the patient have difficulty dressing or bathing?: Yes Independently performs ADLs?: No Communication: Independent Dressing (OT): Needs assistance Is this a change from baseline?: Pre-admission baseline Grooming: Independent Feeding: Independent Bathing: Needs assistance Is this a change from baseline?: Pre-admission baseline Toileting: Needs assistance Is this a change  from baseline?: Pre-admission baseline In/Out Bed: Needs assistance Is this a change from baseline?: Pre-admission baseline Walks in Home: Dependent Is this a change from baseline?: Pre-admission baseline Does the patient have difficulty walking or climbing stairs?: Yes Weakness of Legs: Right Weakness of Arms/Hands: None  Permission Sought/Granted Permission sought to share information with : Case Manager                Emotional Assessment Appearance:: Appears stated age     Orientation: : Oriented to Self, Oriented to Place, Oriented to  Time, Oriented to Situation      Admission diagnosis:  Hepatic encephalopathy ([place]) [K72.90] Hyperkalemia [E87.5] Hyperglycemia [R73.9] AKI (acute kidney injury) ([place]) [N17.9] Urinary tract infection without hematuria, site unspecified [N39.0] Patient Active Problem List   Diagnosis Date Noted  . Hepatic encephalopathy ([place]) 12/28/2018  . UTI (urinary tract infection) 12/28/2018  . Bradycardia 12/28/2018  . Hyperkalemia 12/28/2018  . Hematuria 11/23/2018  . Overweight (BMI 25.0-29.9) 11/23/2018  . Mechanical problem with extremity 11/22/2018  . Cirrhosis of liver with ascites ([place]) 11/09/2018  . Nausea without vomiting 10/26/2018  . Ascites   . Rectal bleeding   . AKI (acute kidney injury) ([place]) 10/11/2018  . Lower GI bleed 10/10/2018  . Anasarca 10/10/2018  . Hypoalbuminemia 10/10/2018  . Depression 09/28/2018  . Acute hepatic encephalopathy 09/28/2018  . Cirrhosis ([place]) 09/28/2018  . Periprosthetic fracture around internal prosthetic right knee joint 09/24/2018  . S/P right TKA 09/22/2018  . Status post total knee replacement, right 09/22/2018  .  DM2 (diabetes mellitus, type 2) ([place]) 07/11/2015  . Hypertension 07/11/2015  . Hyperlipidemia 07/11/2015   PCP:  [facility]:   [facility] [address], [place] [address] Phone: [phone] Fax:  [phone]     Social Determinants of Health (SDOH) Interventions    Readmission Risk Interventions No flowsheet data found.

## 2018-12-31 LAB — CBC WITH DIFFERENTIAL/PLATELET
Abs Immature Granulocytes: 0 10*3/uL (ref 0.00–0.07)
Basophils Absolute: 0 10*3/uL (ref 0.0–0.1)
Basophils Relative: 1 %
Eosinophils Absolute: 0.1 10*3/uL (ref 0.0–0.5)
Eosinophils Relative: 3 %
HCT: 30.1 % — ABNORMAL LOW (ref 36.0–46.0)
Hemoglobin: 9.7 g/dL — ABNORMAL LOW (ref 12.0–15.0)
Immature Granulocytes: 0 %
Lymphocytes Relative: 21 %
Lymphs Abs: 0.7 10*3/uL (ref 0.7–4.0)
MCH: 30.4 pg (ref 26.0–34.0)
MCHC: 32.2 g/dL (ref 30.0–36.0)
MCV: 94.4 fL (ref 80.0–100.0)
Monocytes Absolute: 0.4 10*3/uL (ref 0.1–1.0)
Monocytes Relative: 12 %
Neutro Abs: 1.9 10*3/uL (ref 1.7–7.7)
Neutrophils Relative %: 63 %
Platelets: 45 10*3/uL — ABNORMAL LOW (ref 150–400)
RBC: 3.19 MIL/uL — ABNORMAL LOW (ref 3.87–5.11)
RDW: 15.9 % — ABNORMAL HIGH (ref 11.5–15.5)
WBC: 3 10*3/uL — ABNORMAL LOW (ref 4.0–10.5)
nRBC: 0 % (ref 0.0–0.2)

## 2018-12-31 LAB — GLUCOSE, CAPILLARY
Glucose-Capillary: 204 mg/dL — ABNORMAL HIGH (ref 70–99)
Glucose-Capillary: 233 mg/dL — ABNORMAL HIGH (ref 70–99)
Glucose-Capillary: 261 mg/dL — ABNORMAL HIGH (ref 70–99)
Glucose-Capillary: 281 mg/dL — ABNORMAL HIGH (ref 70–99)

## 2018-12-31 LAB — MAGNESIUM: Magnesium: 1.6 mg/dL — ABNORMAL LOW (ref 1.7–2.4)

## 2018-12-31 LAB — COMPREHENSIVE METABOLIC PANEL
ALT: 31 U/L (ref 0–44)
AST: 36 U/L (ref 15–41)
Albumin: 2.9 g/dL — ABNORMAL LOW (ref 3.5–5.0)
Alkaline Phosphatase: 88 U/L (ref 38–126)
Anion gap: 10 (ref 5–15)
BUN: 15 mg/dL (ref 8–23)
CO2: 21 mmol/L — ABNORMAL LOW (ref 22–32)
Calcium: 8.6 mg/dL — ABNORMAL LOW (ref 8.9–10.3)
Chloride: 99 mmol/L (ref 98–111)
Creatinine, Ser: 1.04 mg/dL — ABNORMAL HIGH (ref 0.44–1.00)
GFR calc Af Amer: >60 mL/min (ref >60)
GFR calc non Af Amer: 54 mL/min — ABNORMAL LOW (ref >60)
Glucose, Bld: 209 mg/dL — ABNORMAL HIGH (ref 70–99)
Potassium: 3.9 mmol/L (ref 3.5–5.1)
Sodium: 130 mmol/L — ABNORMAL LOW (ref 135–145)
Total Bilirubin: 1.2 mg/dL (ref 0.3–1.2)
Total Protein: 5.4 g/dL — ABNORMAL LOW (ref 6.5–8.1)

## 2018-12-31 LAB — URINE CULTURE: Culture: 20000 — AB

## 2018-12-31 LAB — PHOSPHORUS: Phosphorus: 4.2 mg/dL (ref 2.5–4.6)

## 2018-12-31 LAB — AMMONIA: Ammonia: 61 umol/L — ABNORMAL HIGH (ref 9–35)

## 2018-12-31 MED ORDER — MAGNESIUM SULFATE 2 GM/50ML IV SOLN
2.0000 g | Freq: Once | INTRAVENOUS | Status: AC
  Administered 2018-12-31: 2 g via INTRAVENOUS
  Filled 2018-12-31: qty 50

## 2018-12-31 MED ORDER — SODIUM BICARBONATE-DEXTROSE 150-5 MEQ/L-% IV SOLN
150.0000 meq | INTRAVENOUS | Status: AC
  Administered 2018-12-31: 150 meq via INTRAVENOUS
  Filled 2018-12-31: qty 1000

## 2018-12-31 MED ORDER — LACTULOSE 10 GM/15ML PO SOLN
20.0000 g | Freq: Two times a day (BID) | ORAL | Status: DC
  Administered 2018-12-31 – 2019-01-06 (×7): 20 g via ORAL
  Filled 2018-12-31 (×13): qty 30

## 2018-12-31 NOTE — Progress Notes (Signed)
[facility] Gastroenterology Progress Note  Brittany Russo 73 y.o. 09-19-1945   Subjective: Sleeping but easy to arouse. Denies abdominal pain. Oriented X 3  Objective: Vital signs: Vitals:   12/30/18 2059 12/31/18 0446  BP: (!) 144/64 121/60  Pulse: 93 86  Resp: 20 16  Temp: 98.2 F (36.8 C) 98.2 F (36.8 C)  SpO2: 100% 98%    Physical Exam: Gen: lethargic, elderly, no acute distress, oriented X 3 HEENT: anicteric sclera CV: RRR Chest: CTA B Abd: soft, nontender, nondistended, +BS Ext: no edema; right leg brace  Lab Results: Recent Labs    12/30/18 0412 12/31/18 0520  NA 134* 130*  K 4.7 3.9  CL 105 99  CO2 17* 21*  GLUCOSE 74 209*  BUN 18 15  CREATININE 1.01* 1.04*  CALCIUM 8.9 8.6*  MG 1.7 1.6*  PHOS 3.5 4.2   Recent Labs    12/30/18 0412 12/31/18 0520  AST 39 36  ALT 27 31  ALKPHOS 84 88  BILITOT 0.9 1.2  PROT 5.4* 5.4*  ALBUMIN 2.9* 2.9*   Recent Labs    12/30/18 0412 12/31/18 0520  WBC 2.6* 3.0*  NEUTROABS 1.4* 1.9  HGB 10.0* 9.7*  HCT 31.4* 30.1*  MCV 95.7 94.4  PLT 47* 45*      Assessment/Plan: Decompensated cirrhosis with EGD negative for varices. Flex sig with radiation proctitis s/p treatment with radiofrequency ablation. Encephalopathy improving on Lactulose and Rifaximin. Continue supportive care. On IV Abx for UTI with hematuria. No new GI recs.  Brittany Russo 12/31/2018, 11:23 AM  Questions please call [phone] ID: Brittany Russo, female   DOB: [birth year], 73 y.o.   MRN: [id number]

## 2018-12-31 NOTE — Progress Notes (Signed)
PROGRESS NOTE    Brittany Russo  XTK:[id number] DOB: November 21, 1945 DOA: 12/28/2018 PCP: Rosalee Kaufman, PA-C   Brief Narrative:  HPI per Dr. Shela Leff on 12/28/2018 Brittany Russo is a 73 y.o. female with medical history significant of liver cirrhosis secondary to NAFLD, insulin-dependent diabetes mellitus, depression, anxiety, hypertension, hyperlipidemia, seizure disorder, recent right knee extensor mechanism repair on 11/22/2018 presenting to the hospital from her SNF for evaluation of AMS.  Patient is confused and slow to respond to questions.  She is not sure why she is here.  Oriented to self and knows it is October 2020.  Not oriented to place.  Denies fevers, chest pain, shortness of breath, cough, nausea, vomiting, abdominal pain, or dysuria.  No additional history could be obtained from her.  ED Course: Bradycardic with heart rate in the 40s.  Not hypotensive.  Afebrile and no leukocytosis.  Lactic acid 2.5.  Ammonia level 63, elevated compared to prior labs.  Hemoglobin 11.8, above baseline.  Platelet count 116,000, has chronic thrombocytopenia and platelet count is currently improved from baseline.  Sodium 131, chronically low and at baseline.  Potassium 5.5.  Bicarb 19, anion gap 10.  Blood glucose 188.  BUN 34, creatinine 1.5.  Creatinine was 0.7 on 9/13.  T bili 1.6, remainder of LFTs normal.  INR 1.2.  UA with large amount of leukocytes, greater than 50 RBCs, greater than 50 WBCs, and many bacteria on microscopic examination.  Urine culture pending.  VBG with pH 7.34.  Head CT negative for acute finding. Patient received ceftriaxone, lactulose, and 1 L normal saline bolus.  **Interim History  Patient started having some hematuria and some melena so GI and urology were consulted.  Encephalopathy appears to be improving.  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  Dr.  Brien Mates recommended a soft diet and recommended mesalamine suppositories.  Urology feels that the patient stable for outpatient cystoscopy and recommended no inpatient intervention at this time.  Orthopedics evaluated the patient's case and are planning to take out the patient's knee component on Monday or Tuesday because they are worried about her skin breaking down over her femoral component.  Nutritionist was consulted as well for poor p.o. intake  Assessment & Plan:   Principal Problem:   Hepatic encephalopathy ([place]) Active Problems:   AKI (acute kidney injury) ([place])   UTI (urinary tract infection)   Bradycardia   Hyperkalemia  Acute Hepatic Encephalopathy, improving  Decompensated liver cirrhosis without varices -History of liver cirrhosis secondary to NAFLD -Patient was confused and slow to respond to questions and on admission was Oriented to self and knows it is October 2020.  Not oriented to place and does not know why she is here.;  Mentation is much improved  -Ammonia level 63 on admission and was elevated compared to prior labs and is improved to 57 but elevated to 61 -No lactulose listed in home medications.  Only rifaximin is listed.   -Stroke less likely as head CT negative and neuro exam nonfocal.   -Does have a UTI with hematuria which is also likely contributing to the patient's encephalopathy.   -In addition, polypharmacy could also be contributing as Xanax, Norco, and Robaxin listed in home medications. -Lactulose started at 20 g po TIDprn to maintain 2-3 BM's daily changed to 20 mg p.o. twice daily scheduled -Continue Rifaximin -Repeat ammonia level this AM showed improvement  and was 65 -Treatment of UTI as mentioned below -Hold sedating medications -Continue to monitor mental status  UTI with Hematuria -Afebrile no leukocytosis.   -Lactic acid mildly elevated, dehydration could be contributing.   -UA with large amount of leukocytes, greater than 50 RBCs, greater  than 50 WBCs, and many bacteria on microscopic examination. -Continue IV ceftriaxone -Urine culture pending along with Blood Cx; Urine Cx grew 20,000 CFU of Citrobacter Braakii sensitivities were reintubated for better growth -Urology consulted for further evaluation given her Hematuria and Dr. Alyson Ingles evaluated yesterday and he requested the urine is sent for cystology but likely meant cytology.  He recommends that she be scheduled for an outpatient cystoscopy because first hemoglobin is currently stable and she is emptying well we do not feel like any further intervention at this point was necessary for her gross hematuria -MA consistently pushing for a cystoscopy by urology still saying that this needs to be done in outpatient setting  Melena with Clots  -Dr. Watt Climes was consulted for further evaluation recommendations and feels that this is likely secondary to radiation proctitis and he has discussed the risks and benefits and methods of endoscopy and flex sig  -EGD done and showed "Normal larynx. Small hiatal hernia. A single gastric polyp. Atrophic gastritis. Normal duodenal bulb, first portion of the duodenum, second portion of the duodenum and third portion of the duodenum." -Flex Sig done showed stool in the mid sigmoid colon with no blood seen coming from above.  There is also mucosal ulceration that was treated with radiofrequency ablation and that was palpable with radiation damage and the examination was otherwise normal -Continue to monitor for signs and symptoms of bleeding; continue to monitor hemoglobin and hematocrit and trend -Dr. Driscilla Grammes recommends a soft diet today as well as using MiraLAX twice a day to keep stools soft and recommending Canasa 1000 mg suppository per rectum nightly indefinitely -hemoglobin/hematocrit is now stable at 9.7/30.1 -Appreciate further GI evaluation and they have no new recommendations  AKI, improving  -BUN 34, creatinine 1.57 on Admission.    -Creatinine was 0.7 on 9/13.  Likely prerenal secondary to dehydration and home diuretic use. -IV fluid hydration with NS at 75 mL/hr to be continued but with Sodium Bicarbonate -BUN/Cr is now 15/1.04 -Continue to monitor renal function -Monitor urine output -Hold diuretics for now -Repeat CMP in AM   Bradycardia, PR prolongation -Bradycardic with heart rate in the 40s.  Not hypotensive.  -Atenolol and Cardizem listed in home medications. -Cardiac monitoring -Hold atenolol and Cardizem for now  -Checked TSH and was 3.562 and free T4 was 1.18  Mild lactic acidosis -Lactic acid 2.5.  Suspect related to dehydration.  Does have a UTI but no fever or leukocytosis. -Received 1 L fluid bolus in the ED.  Continue continued IV fluid hydration with normal saline at a rate of 75 mL's per hour but then changed to sodium bicarbonate with 150 mEq of bicarbonate and dextrose 5% at 75 mL's per hour -Continue to trend lactate  Mild hyperkalemia, improving  -Potassium 5.5 on admission.  Likely related to AKI and home medications (spironolactone and potassium supplement). -Cardiac monitoring -Hold spironolactone and potassium supplement -K+ is now 3.9 -Continue to monitor potassium level  Normal anion gap metabolic acidosis -Bicarb 19, anion gap 10 on Admission and now CO2 is now 21 and AG is now 10 with Chloride Level of 99.   -Likely related to home diuretic use and AKI. -Hold diuretic -IV fluid hydration changed to  IV Sodium Bicarbonate and will continue for 1 more day -Continue to monitor  Hyperbilirubinemia -T bili was 1.6 on Admission.   -Does have a history of cirrhosis but transaminases are normal. -Now T bili is 1.2 -Continue to Monitor and Trend -Repeat CMP in AM   Insulin-dependent diabetes mellitus -Checked A1c level and was 6.3.   -Sliding scale insulin sensitive and CBG checks. -CBG ranging from 204-278  Recent knee surgery Patient underwent right knee extensor  mechanism repair on 11/22/2018.  Has a knee brace in place. -PT evaluation recommending SNF -Orthopedics evaluated and recommending removing her knee components of the Monday or Tuesday and will know more information when our schedule is available -Patient likely to have knee surgery this admission  Hyponatremia -Had improved and went from 131 -> 136 but is now 130 and dropped  -C/w IVF Hydration but NS stopped and started on IV Sodium Bicarbonate -Continue to Monitor and Trend -Repeat CMP in AM   Thrombocytopenia -In the setting of liver cirrhosis and likely splenic sequestration but worsened in the setting of bleeding The patient platelet count went from 116,000 now 45,000 -Continue to monitor for signs and symptoms of bleeding and she is having some melena and some hematuria -Repeat CBC  Pancytopenia -From Liver Cirrhosis and likely dilutional drop -WBC is now 3.0, Hgb/HCt is now 9.7/30.1, and Platelet Count is now 45,000 -Continue to Monitor and Trend  -Repeat CBC in AM   Obesity -Estimated body mass index is 31.65 kg/m as calculated from the following:   Height as of this encounter: 5' 2"  (1.575 m).   Weight as of this encounter: 78.5 kg. -Weight Loss and Dietary Counseling given   Hypomagnesemia -Patient's magnesium level this morning was 1.66 -Replete with IV mag sulfate 2 g -Continue to monitor replete as necessary -Repeat magnesium level in a.m.  DVT prophylaxis: SCDs Code Status: FULL CODE Family Communication: No family present at bedside  Disposition Plan: Pending further workup and improvement back to baseline; Needs SNF  Consultants:   Gastroenterology   Urology   Orthopedic Surgery    Procedures:  EGD Findings:      The larynx was normal.      A small hiatal hernia was present.      A single diminutive semi-sessile polyp was found in the cardia.      Diffuse minimal inflammation characterized by congestion (edema) was       found in the gastric  body.      The duodenal bulb, first portion of the duodenum, second portion of the       duodenum and third portion of the duodenum were normal.      The exam was otherwise without abnormality. Impression:               - Normal larynx.                           - Small hiatal hernia.                           - A single gastric polyp.                           - Atrophic gastritis.                           -  Normal duodenal bulb, first portion of the                            duodenum, second portion of the duodenum and third                            portion of the duodenum.                           - The examination was otherwise normal. And                            specifically no signs of esophageal or gastric                            varices and no signs of portal gastropathy                           - No specimens collected.  Flex Sigmoidoscopy Findings:      A large amount of solid stool was found in the mid sigmoid colon,       interfering with visualization.      Oozing ulcerated mucosa with stigmata of recent bleeding were present in       the distal rectum. She also had some other areas of probable radiation       proctitis and we proceeded with focal radiofrequency ablation of her       radiation damage which was performed. With the endoscope in place,       Endoscopic visualization identified an ablation site. The radiofrequency       channel ablation catheter was introduced through the endoscope working       channel. . The endoscope with the ablation catheter was advanced to the       areas radiation mucosa. The endoscope with the channel ablation catheter       was positioned under direct visualization so that the catheter was       placed in contact with the surface of the radiation mucosa. Energy was       applied x11 bursts only. The ablation catheter was removed through the       endoscope working channel. The areas of the rectum mucosa had been        ablated were examined and no further oozing was seen.      The exam was otherwise without abnormality. Impression:               - Stool in the mid sigmoid colon. No blood seen                            coming from above                           - Mucosal ulceration. Treated with radiofrequency                            ablation. Patible with radiation damage                           -  The examination was otherwise normal.                           - No specimens collected.  Antimicrobials:  Anti-infectives (From admission, onward)   Start     Dose/Rate Route Frequency Ordered Stop   12/29/18 2200  cefTRIAXone (ROCEPHIN) 1 g in sodium chloride 0.9 % 100 mL IVPB     1 g 200 mL/hr over 30 Minutes Intravenous Every 24 hours 12/28/18 2240     12/29/18 1000  rifaximin (XIFAXAN) tablet 550 mg     550 mg Oral 2 times daily 12/28/18 2240     12/28/18 2200  cefTRIAXone (ROCEPHIN) 1 g in sodium chloride 0.9 % 100 mL IVPB     1 g 200 mL/hr over 30 Minutes Intravenous  Once 12/28/18 2153 12/28/18 2257     Subjective: Patient examined at bedside and she was feeling sleepy today and wanted to rest.  She is easily arousable but will fall back to sleep.  No chest pain, and nursing states that she did not know how she fell.  No other concerns reported at this time  Objective: Vitals:   12/30/18 1347 12/30/18 2059 12/31/18 0446 12/31/18 1336  BP: (!) 127/49 (!) 144/64 121/60 121/67  Pulse: 84 93 86 90  Resp: 18 20 16 12   Temp: (!) 97.5 F (36.4 C) 98.2 F (36.8 C) 98.2 F (36.8 C) 98.1 F (36.7 C)  TempSrc: Oral Oral Oral Oral  SpO2: 100% 100% 98% 99%  Weight:      Height:        Intake/Output Summary (Last 24 hours) at 12/31/2018 2018 Last data filed at 12/31/2018 1632 Gross per 24 hour  Intake 2403.03 ml  Output 1300 ml  Net 1103.03 ml   Filed Weights   12/29/18 0039 12/30/18 0659  Weight: 78.5 kg 78.5 kg   Examination: Physical Exam:  Constitutional: WN/WD obese Caucasian  female in NAD appears calm and drowsy wanting to sleep Eyes: Lids and conjunctivae normal, sclerae anicteric  ENMT: External Ears, Nose appear normal. Grossly normal hearing. Mucous membranes are moist.  Neck: Appears normal, supple, no cervical masses, normal ROM, no appreciable thyromegaly; no JVD Respiratory: Diminished to auscultation bilaterally, no wheezing, rales, rhonchi or crackles. Normal respiratory effort and patient is not tachypenic. No accessory muscle use. Unlabored breathing   Cardiovascular: RRR, no murmurs / rubs / gallops. S1 and S2 auscultated. Mild extremity edema Abdomen: Soft, non-tender, Distended x4. Bowel sounds positive x4.  GU: Deferred. Musculoskeletal: Right Leg Immobilized and in a Brace Skin: No rashes, lesions, ulcers on a limited skin evaluation. No induration; Warm and dry.  Neurologic: CN 2-12 grossly intact with no focal deficits. Romberg sign and cerebellar reflexes not assessed.  Psychiatric: Normal judgment and insight. Drowsy and somnolent. Depressed appearing mood and flat affect.   Data Reviewed: I have personally reviewed following labs and imaging studies  CBC: Recent Labs  Lab 12/28/18 1812 12/29/18 0413 12/30/18 0412 12/31/18 0520  WBC 6.1 4.6 2.6* 3.0*  NEUTROABS 3.8 2.6 1.4* 1.9  HGB 11.8* 11.4* 10.0* 9.7*  HCT 36.1 35.9* 31.4* 30.1*  MCV 95.8 96.8 95.7 94.4  PLT 116* 68* 47* 45*   Basic Metabolic Panel: Recent Labs  Lab 12/28/18 1812 12/29/18 0413 12/30/18 0412 12/31/18 0520  NA 131* 136 134* 130*  K 5.5* 5.0 4.7 3.9  CL 102 105 105 99  CO2 19* 19* 17* 21*  GLUCOSE 188*  103* 74 209*  BUN 34* 34* 18 15  CREATININE 1.57* 1.32* 1.01* 1.04*  CALCIUM 9.2 8.9 8.9 8.6*  MG  --  2.0 1.7 1.6*  PHOS  --  5.1* 3.5 4.2   GFR: Estimated Creatinine Clearance: 48.9 mL/min (A) (by C-G formula based on SCr of 1.04 mg/dL (H)). Liver Function Tests: Recent Labs  Lab 12/28/18 1812 12/29/18 0413 12/30/18 0412 12/31/18 0520  AST 34   --  39 36  ALT 32  --  27 31  ALKPHOS 97  --  84 88  BILITOT 1.6* 1.4* 0.9 1.2  PROT 6.4*  --  5.4* 5.4*  ALBUMIN 3.3*  --  2.9* 2.9*   No results for input(s): LIPASE, AMYLASE in the last 168 hours. Recent Labs  Lab 12/28/18 1813 12/29/18 0413 12/31/18 0520  AMMONIA 63* 57* 61*   Coagulation Profile: Recent Labs  Lab 12/28/18 1812  INR 1.2   Cardiac Enzymes: No results for input(s): CKTOTAL, CKMB, CKMBINDEX, TROPONINI in the last 168 hours. BNP (last 3 results) No results for input(s): PROBNP in the last 8760 hours. HbA1C: Recent Labs    12/28/18 2238  HGBA1C 6.3*   CBG: Recent Labs  Lab 12/30/18 1648 12/30/18 2100 12/31/18 0749 12/31/18 1137 12/31/18 1610  GLUCAP 278* 233* 204* 233* 261*   Lipid Profile: No results for input(s): CHOL, HDL, LDLCALC, TRIG, CHOLHDL, LDLDIRECT in the last 72 hours. Thyroid Function Tests: Recent Labs    12/29/18 0413  TSH 3.562  FREET4 1.18*   Anemia Panel: No results for input(s): VITAMINB12, FOLATE, FERRITIN, TIBC, IRON, RETICCTPCT in the last 72 hours. Sepsis Labs: Recent Labs  Lab 12/28/18 1813 12/28/18 2240  LATICACIDVEN 2.5* 1.8    Recent Results (from the past 240 hour(s))  Urine culture     Status: Abnormal   Collection Time: 12/28/18  5:43 PM   Specimen: Urine, Random  Result Value Ref Range Status   Specimen Description   Final    URINE, RANDOM Performed at [facility] [address] [place], [place] [address]    Special Requests   Final    NONE Performed at [facility], [place] [address], [place], [place] [address]    Culture (A)  Final    20,000 COLONIES/mL CITROBACTER BRAAKII >=100,000 COLONIES/mL ENTEROCOCCUS RAFFINOSUS    Report Status 12/31/2018 FINAL  Final   Organism ID, Bacteria CITROBACTER BRAAKII (A)  Final   Organism ID, Bacteria ENTEROCOCCUS RAFFINOSUS (A)  Final      Susceptibility   Citrobacter braakii - MIC*    CEFAZOLIN >=64 RESISTANT Resistant      CEFTRIAXONE <=1 SENSITIVE Sensitive     CIPROFLOXACIN <=0.25 SENSITIVE Sensitive     GENTAMICIN <=1 SENSITIVE Sensitive     IMIPENEM 0.5 SENSITIVE Sensitive     NITROFURANTOIN 256 RESISTANT Resistant     TRIMETH/SULFA <=20 SENSITIVE Sensitive     PIP/TAZO <=4 SENSITIVE Sensitive     * 20,000 COLONIES/mL CITROBACTER BRAAKII   Enterococcus raffinosus - MIC*    AMPICILLIN 16 RESISTANT Resistant     LEVOFLOXACIN 4 INTERMEDIATE Intermediate     NITROFURANTOIN 32 SENSITIVE Sensitive     VANCOMYCIN <=0.5 SENSITIVE Sensitive     * >=100,000 COLONIES/mL ENTEROCOCCUS RAFFINOSUS  SARS CORONAVIRUS 2 (TAT 6-24 HRS) Nasopharyngeal Nasopharyngeal Swab     Status: None   Collection Time: 12/28/18 10:53 PM   Specimen: Nasopharyngeal Swab  Result Value Ref Range Status   SARS Coronavirus 2 NEGATIVE NEGATIVE  Final    Comment: (NOTE) SARS-CoV-2 target nucleic acids are NOT DETECTED. The SARS-CoV-2 RNA is generally detectable in upper and lower respiratory specimens during the acute phase of infection. Negative results do not preclude SARS-CoV-2 infection, do not rule out co-infections with other pathogens, and should not be used as the sole basis for treatment or other patient management decisions. Negative results must be combined with clinical observations, patient history, and epidemiological information. The expected result is Negative. Fact Sheet for Patients: SugarRoll.be Fact Sheet for Healthcare Providers: https://www.woods-mathews.com/ This test is not yet approved or cleared by the [place] FDA and  has been authorized for detection and/or diagnosis of SARS-CoV-2 by FDA under an Emergency Use Authorization (EUA). This EUA will remain  in effect (meaning this test can be used) for the duration of the COVID-19 declaration under Section 56 4(b)(1) of the Act, 21 U.S.C. section 360bbb-3(b)(1), unless the authorization is terminated or revoked  sooner. Performed at [facility], [place] [address], [place], [place] [address]   MRSA PCR Screening     Status: None   Collection Time: 12/29/18  7:41 AM   Specimen: Nasal Mucosa; Nasopharyngeal  Result Value Ref Range Status   MRSA by PCR NEGATIVE NEGATIVE Final    Comment:        The GeneXpert MRSA Assay (FDA approved for NASAL specimens only), is one component of a comprehensive MRSA colonization surveillance program. It is not intended to diagnose MRSA infection nor to guide or monitor treatment for MRSA infections. Performed at [facility], [place] [address] [place], [place] [address]   Culture, blood (routine x 2)     Status: None (Preliminary result)   Collection Time: 12/29/18  9:08 AM   Specimen: BLOOD RIGHT HAND  Result Value Ref Range Status   Specimen Description   Final    BLOOD RIGHT HAND Performed at [facility] [address] [place], [place] [address]    Special Requests   Final    BOTTLES DRAWN AEROBIC ONLY Blood Culture adequate volume Performed at [facility] [address] [place], [place] [address]    Culture   Final    NO GROWTH 2 DAYS Performed at [facility] [address] [place], [place] [address]    Report Status PENDING  Incomplete  Culture, blood (routine x 2)     Status: None (Preliminary result)   Collection Time: 12/29/18  9:08 AM   Specimen: BLOOD LEFT HAND  Result Value Ref Range Status   Specimen Description   Final    BLOOD LEFT HAND Performed at [facility] [address], [place], [place] [address]    Special Requests   Final    BOTTLES DRAWN AEROBIC ONLY Blood Culture results may not be optimal due to an inadequate volume of blood received in culture bottles Performed at [facility] [address] [place], [place] [address]    Culture   Final    NO GROWTH 2 DAYS Performed at [facility]  [address] [place], [place] [address]    Report Status PENDING  Incomplete   Radiology Studies: No results found. Scheduled Meds: . ALPRAZolam  0.25 mg Oral BID  . buPROPion  150 mg Oral Daily  . DULoxetine  60 mg Oral Daily  . fluticasone  1 spray Each Nare Daily  . gabapentin  300 mg Oral BID  . insulin aspart  0-5 Units Subcutaneous QHS  . insulin aspart  0-9 Units Subcutaneous TID WC  . lactulose  20 g Oral BID  . loratadine  10 mg Oral Daily  . mesalamine  1,000 mg Rectal QHS  . montelukast  10 mg Oral QHS  . multivitamin with minerals  1 tablet Oral Daily  . polyethylene glycol  17 g Oral BID  . rifaximin  550 mg Oral BID   Continuous Infusions: . cefTRIAXone (ROCEPHIN)  IV Stopped (12/31/18 0016)  . lactated ringers Stopped (12/30/18 0853)  . sodium bicarbonate 150 mEq in dextrose 5% 1000 mL 150 mEq (12/31/18 1632)    LOS: 3 days   Kerney Elbe, DO [facility] PAGER is on [place]  If 7PM-7AM, please contact night-coverage [email or web address] Password [email or web address] 12/31/2018, 8:18 PM

## 2018-12-31 NOTE — Progress Notes (Signed)
Patient ID: GERMAINE RIPP, female   DOB: [birth year], 73 y.o.   MRN: [id number]  Mrs Weipert was admitted to the hospital based on long standing chronic medical conditions in addition to a failure to thrive condition while at her previous nursing home.  In addition to all of this she is dealing with an extremely complicated right knee issue.  I will need to take her revised total knee components out during this hospital stay likely Monday or Tuesday.  This will leave her without a functioning knee but I need an interval time to allow for recovery.  I am worried about her skin breaking down over her femoral component. Mrs Bermingham, her family and I had a lengthy office visit last week outlining her options.  I need for her health to be optimized.   I have worried about her nutritional status in past with low albumin and pre-albumin level and the impact this would have on her healing from any further surgical treatment.  Orders will be placed when I know the OR schedule better.

## 2018-12-31 NOTE — Plan of Care (Signed)
  Problem: Education: Goal: Knowledge of General Education information will improve Description: Including pain rating scale, medication(s)/side effects and non-pharmacologic comfort measures Outcome: Progressing   Problem: Health Behavior/Discharge Planning: Goal: Ability to manage health-related needs will improve Outcome: Progressing   Problem: Clinical Measurements: Goal: Ability to maintain clinical measurements within normal limits will improve Outcome: Progressing Goal: Will remain free from infection Outcome: Progressing Goal: Diagnostic test results will improve Outcome: Progressing Goal: Respiratory complications will improve Outcome: Progressing Goal: Cardiovascular complication will be avoided Outcome: Progressing   Problem: Activity: Goal: Risk for activity intolerance will decrease Outcome: Progressing   Problem: Nutrition: Goal: Adequate nutrition will be maintained Outcome: Progressing   Problem: Elimination: Goal: Will not experience complications related to bowel motility Outcome: Progressing   Problem: Pain Managment: Goal: General experience of comfort will improve Outcome: Progressing   Problem: Safety: Goal: Ability to remain free from injury will improve Outcome: Progressing   Problem: Skin Integrity: Goal: Risk for impaired skin integrity will decrease Outcome: Progressing   Problem: Urinary Elimination: Goal: Signs and symptoms of infection will decrease Outcome: Progressing

## 2019-01-01 DIAGNOSIS — A498 Other bacterial infections of unspecified site: Secondary | ICD-10-CM

## 2019-01-01 DIAGNOSIS — B952 Enterococcus as the cause of diseases classified elsewhere: Secondary | ICD-10-CM

## 2019-01-01 LAB — COMPREHENSIVE METABOLIC PANEL
ALT: 29 U/L (ref 0–44)
AST: 34 U/L (ref 15–41)
Albumin: 2.6 g/dL — ABNORMAL LOW (ref 3.5–5.0)
Alkaline Phosphatase: 78 U/L (ref 38–126)
Anion gap: 9 (ref 5–15)
BUN: 9 mg/dL (ref 8–23)
CO2: 25 mmol/L (ref 22–32)
Calcium: 8.3 mg/dL — ABNORMAL LOW (ref 8.9–10.3)
Chloride: 98 mmol/L (ref 98–111)
Creatinine, Ser: 0.85 mg/dL (ref 0.44–1.00)
GFR calc Af Amer: >60 mL/min (ref >60)
GFR calc non Af Amer: >60 mL/min (ref >60)
Glucose, Bld: 221 mg/dL — ABNORMAL HIGH (ref 70–99)
Potassium: 3.6 mmol/L (ref 3.5–5.1)
Sodium: 132 mmol/L — ABNORMAL LOW (ref 135–145)
Total Bilirubin: 1 mg/dL (ref 0.3–1.2)
Total Protein: 4.9 g/dL — ABNORMAL LOW (ref 6.5–8.1)

## 2019-01-01 LAB — PHOSPHORUS: Phosphorus: 4 mg/dL (ref 2.5–4.6)

## 2019-01-01 LAB — CBC WITH DIFFERENTIAL/PLATELET
Abs Immature Granulocytes: 0 10*3/uL (ref 0.00–0.07)
Basophils Absolute: 0 10*3/uL (ref 0.0–0.1)
Basophils Relative: 1 %
Eosinophils Absolute: 0.1 10*3/uL (ref 0.0–0.5)
Eosinophils Relative: 3 %
HCT: 26.7 % — ABNORMAL LOW (ref 36.0–46.0)
Hemoglobin: 8.7 g/dL — ABNORMAL LOW (ref 12.0–15.0)
Immature Granulocytes: 0 %
Lymphocytes Relative: 22 %
Lymphs Abs: 0.6 10*3/uL — ABNORMAL LOW (ref 0.7–4.0)
MCH: 31 pg (ref 26.0–34.0)
MCHC: 32.6 g/dL (ref 30.0–36.0)
MCV: 95 fL (ref 80.0–100.0)
Monocytes Absolute: 0.5 10*3/uL (ref 0.1–1.0)
Monocytes Relative: 16 %
Neutro Abs: 1.6 10*3/uL — ABNORMAL LOW (ref 1.7–7.7)
Neutrophils Relative %: 58 %
Platelets: 38 10*3/uL — ABNORMAL LOW (ref 150–400)
RBC: 2.81 MIL/uL — ABNORMAL LOW (ref 3.87–5.11)
RDW: 16 % — ABNORMAL HIGH (ref 11.5–15.5)
WBC: 2.8 10*3/uL — ABNORMAL LOW (ref 4.0–10.5)
nRBC: 0 % (ref 0.0–0.2)

## 2019-01-01 LAB — GLUCOSE, CAPILLARY
Glucose-Capillary: 176 mg/dL — ABNORMAL HIGH (ref 70–99)
Glucose-Capillary: 266 mg/dL — ABNORMAL HIGH (ref 70–99)
Glucose-Capillary: 250 mg/dL — ABNORMAL HIGH (ref 70–99)
Glucose-Capillary: 349 mg/dL — ABNORMAL HIGH (ref 70–99)

## 2019-01-01 LAB — MAGNESIUM: Magnesium: 1.9 mg/dL (ref 1.7–2.4)

## 2019-01-01 LAB — AMMONIA: Ammonia: 38 umol/L — ABNORMAL HIGH (ref 9–35)

## 2019-01-01 MED ORDER — VANCOMYCIN HCL 10 G IV SOLR
1500.0000 mg | Freq: Once | INTRAVENOUS | Status: AC
  Administered 2019-01-01: 1500 mg via INTRAVENOUS
  Filled 2019-01-01: qty 1500

## 2019-01-01 MED ORDER — VANCOMYCIN HCL IN DEXTROSE 1-5 GM/200ML-% IV SOLN
1000.0000 mg | INTRAVENOUS | Status: DC
  Administered 2019-01-02 – 2019-01-05 (×4): 1000 mg via INTRAVENOUS
  Filled 2019-01-01 (×4): qty 200

## 2019-01-01 MED ORDER — POLYETHYLENE GLYCOL 3350 17 G PO PACK: 17.0000 g | PACK | Freq: Two times a day (BID) | ORAL | Status: DC | PRN

## 2019-01-01 NOTE — Progress Notes (Signed)
[facility]ology Progress Note  Brittany Russo 73 y.o. 12-Aug-1945   Subjective: More alert today. Small bowel movement overnight. Denies abdominal pain. Did not sleep well due to leaking of urinary catheter.  Objective: Vital signs: Vitals:   01/01/19 0433 01/01/19 1235  BP: 129/60 123/65  Pulse: 79 86  Resp: 19 20  Temp: 97.8 F (36.6 C) 98 F (36.7 C)  SpO2: 97% 97%    Physical Exam: Gen: lethargic, no acute distress, elderly HEENT: anicteric sclera CV: RRR Chest: CTA B Abd: LLQ tenderness with guarding, soft, nondistended, +BS Ext: trace pedal edema  Lab Results: Recent Labs    12/31/18 0520 01/01/19 0420  NA 130* 132*  K 3.9 3.6  CL 99 98  CO2 21* 25  GLUCOSE 209* 221*  BUN 15 9  CREATININE 1.04* 0.85  CALCIUM 8.6* 8.3*  MG 1.6* 1.9  PHOS 4.2 4.0   Recent Labs    12/31/18 0520 01/01/19 0420  AST 36 34  ALT 31 29  ALKPHOS 88 78  BILITOT 1.2 1.0  PROT 5.4* 4.9*  ALBUMIN 2.9* 2.6*   Recent Labs    12/31/18 0520 01/01/19 0420  WBC 3.0* 2.8*  NEUTROABS 1.9 1.6*  HGB 9.7* 8.7*  HCT 30.1* 26.7*  MCV 94.4 95.0  PLT 45* 38*      Assessment/Plan: Decompensated cirrhosis with improving hepatic encephalopathy on Lactulose and Rifaximin. Flex sig by Dr. Watt Russo showing radiation proctitis s/p RFA. EGD negative for varices. On IV Abx for UTI with hematuria. Continue supportive care. [facility] will f/u tomorrow.   Brittany Russo 01/01/2019, 12:41 PM  Questions please call [phone] ID: Brittany Russo, female   DOB: 10-Nov-1945, 73 y.o.   MRN: [id number]

## 2019-01-01 NOTE — Progress Notes (Signed)
Pharmacy Antibiotic Note  Brittany Russo is a 73 y.o. female admitted on 12/28/2018 with altered mental status.  Pharmacy has been consulted for vancomcycin dosing for UTI.  Started on Rocephin at 10 pm on 10/7.  To add vancomycin now for enterococcus raffinosus in UCx. WBC low at 2.8, Scr WNL at 0.85. AF. Denied dysuria on admit but had hematuria.   Plan:  Vancomycin 1500 mg IV x 1 loading dose Vancomycin 1000 mg IV Q 24 hrs. Goal AUC 400-550. Expected AUC: 456.4 Css max 29.2 Css min 12 SCr used: rounded up to 1 from 0.85 Vd used 0.72 ( BMI 31.5) AUC 586.3 using Scr 0.85 and Vd 0.5 - acceptable - anticipate short course for UTI  Continue on CTX 1 gm q24 F/u renal fxn, WBC, temp, culture data Vancomycin levels if > 5 days planned  Height: 5\' 2"  (157.5 cm) Weight: 173 lb 1 oz (78.5 kg) IBW/kg (Calculated) : 50.1  Temp (24hrs), Avg:98.1 F (36.7 C), Min:97.8 F (36.6 C), Max:98.3 F (36.8 C)  Recent Labs  Lab 12/28/18 1812 12/28/18 1813 12/28/18 2240 12/29/18 0413 12/30/18 0412 12/31/18 0520 01/01/19 0420  WBC 6.1  --   --  4.6 2.6* 3.0* 2.8*  CREATININE 1.57*  --   --  1.32* 1.01* 1.04* 0.85  LATICACIDVEN  --  2.5* 1.8  --   --   --   --     Estimated Creatinine Clearance: 59.8 mL/min (by C-G formula based on SCr of 0.85 mg/dL).    Allergies  Allergen Reactions  . Asa [Aspirin] Other (See Comments)    Pt not allergic but cannot take due to bleeding   . Other Other (See Comments)    Any jewelry metal-hives and itching     Antimicrobials this admission: 10/7 CTX>> 10/11 vanc>> Dose adjustments this admission:  Microbiology results: 10/8 MRSA PCR neg 10/8 BCx2: ngtd 10/10 UCx: > 100K enterococcus raffinosus R amp, Int Lvq, Send NTF and Vanc; also 20 K citrobacter braakii R to amp and NFT but sens to all others abx tested including CTX   Thank you for allowing pharmacy to be a part of this patient's care.  Eudelia Bunch, Pharm.D [phone] 01/01/2019 9:01  AM

## 2019-01-01 NOTE — Plan of Care (Signed)
  Problem: Education: Goal: Knowledge of General Education information will improve Description: Including pain rating scale, medication(s)/side effects and non-pharmacologic comfort measures Outcome: Progressing   Problem: Health Behavior/Discharge Planning: Goal: Ability to manage health-related needs will improve Outcome: Progressing   Problem: Clinical Measurements: Goal: Ability to maintain clinical measurements within normal limits will improve Outcome: Progressing Goal: Will remain free from infection Outcome: Progressing Goal: Diagnostic test results will improve Outcome: Progressing Goal: Respiratory complications will improve Outcome: Progressing Goal: Cardiovascular complication will be avoided Outcome: Progressing   Problem: Activity: Goal: Risk for activity intolerance will decrease Outcome: Progressing   Problem: Pain Managment: Goal: General experience of comfort will improve Outcome: Progressing   Problem: Safety: Goal: Ability to remain free from injury will improve Outcome: Progressing   Problem: Skin Integrity: Goal: Risk for impaired skin integrity will decrease Outcome: Progressing   

## 2019-01-01 NOTE — Progress Notes (Signed)
PROGRESS NOTE    [name]  TKP:[id number] DOB: 1946/01/01 DOA: 12/28/2018 PCP: Rosalee Kaufman, PA-C   Brief Narrative:  HPI per Dr. Shela Leff on 12/28/2018 Brittany Russo is a 73 y.o. female with medical history significant of liver cirrhosis secondary to NAFLD, insulin-dependent diabetes mellitus, depression, anxiety, hypertension, hyperlipidemia, seizure disorder, recent right knee extensor mechanism repair on 11/22/2018 presenting to the hospital from her SNF for evaluation of AMS.  Patient is confused and slow to respond to questions.  She is not sure why she is here.  Oriented to self and knows it is October 2020.  Not oriented to place.  Denies fevers, chest pain, shortness of breath, cough, nausea, vomiting, abdominal pain, or dysuria.  No additional history could be obtained from her.  ED Course: Bradycardic with heart rate in the 40s.  Not hypotensive.  Afebrile and no leukocytosis.  Lactic acid 2.5.  Ammonia level 63, elevated compared to prior labs.  Hemoglobin 11.8, above baseline.  Platelet count 116,000, has chronic thrombocytopenia and platelet count is currently improved from baseline.  Sodium 131, chronically low and at baseline.  Potassium 5.5.  Bicarb 19, anion gap 10.  Blood glucose 188.  BUN 34, creatinine 1.5.  Creatinine was 0.7 on 9/13.  T bili 1.6, remainder of LFTs normal.  INR 1.2.  UA with large amount of leukocytes, greater than 50 RBCs, greater than 50 WBCs, and many bacteria on microscopic examination.  Urine culture pending.  VBG with pH 7.34.  Head CT negative for acute finding. Patient received ceftriaxone, lactulose, and 1 L normal saline bolus.  **Interim History  Patient started having some hematuria and some melena so GI and urology were consulted.  Encephalopathy appears to be improving.  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  Dr.  Brien Mates recommended a soft diet and recommended mesalamine suppositories.  Urology feels that the patient stable for outpatient cystoscopy and recommended no inpatient intervention at this time.  Orthopedics evaluated the patient's case and are planning to take out the patient's knee component on Monday or Tuesday because they are worried about her skin breaking down over her femoral component.  Nutritionist was consulted as well for poor p.o. intake  Assessment & Plan:   Principal Problem:   Hepatic encephalopathy ([place]) Active Problems:   AKI (acute kidney injury) ([place])   UTI (urinary tract infection)   Bradycardia   Hyperkalemia  Acute Hepatic Encephalopathy, improving  Decompensated liver cirrhosis without varices -History of liver cirrhosis secondary to NAFLD -Patient was confused and slow to respond to questions and on admission was Oriented to self and knows it is October 2020.  Not oriented to place and does not know why she is here.;  Mentation is much improved  -Ammonia level 63 on admission and was elevated compared to prior labs and is improved to 57 but elevated to 61 -No lactulose listed in home medications.  Only rifaximin is listed.   -Stroke less likely as head CT negative and neuro exam nonfocal.   -Does have a UTI with hematuria which is also likely contributing to the patient's encephalopathy.   -In addition, polypharmacy could also be contributing as Xanax, Norco, and Robaxin listed in home medications. -Lactulose started at 20 g po TIDprn to maintain 2-3 BM's daily changed to 20 mg p.o. twice daily scheduled for now  -Continue Rifaximin -Repeat ammonia level this  AM showed improvement and was 38 -Treatment of UTI as mentioned below -Hold sedating medications -Continue to monitor mental status carefully   Citrobacter Braakii and Enterococcus Raffinosus UTI with Hematuria, poA -Afebrile no leukocytosis but has Leukopenia .   -Lactic acid mildly elevated, dehydration  could be contributing.   -UA with large amount of leukocytes, greater than 50 RBCs, greater than 50 WBCs, and many bacteria on microscopic examination. -Continue IV ceftriaxone and added IV Vancomycin for Enterococcus Raffinosus  -Urine culture pending along with Blood Cx; Urine Cx grew 20,000 CFU of Citrobacter Braakii sensitive to Ceftriaxone and >100,000 CFU of Enterococcus Raffinosus that was Resistant to most Abx but Sensitive to IV Vancomycin  -Urology consulted for further evaluation given her Hematuria and Dr. Alyson Ingles evaluated yesterday and he requested the urine is sent for cystology but likely meant cytology.  He recommends that she be scheduled for an outpatient cystoscopy because first hemoglobin is currently stable and she is emptying well we do not feel like any further intervention at this point was necessary for her gross hematuria -Family consistently pushing for a cystoscopy by urology still saying that this needs to be done in outpatient setting  Melena with Clots, stable now   -Dr. Watt Climes was consulted for further evaluation recommendations and feels that this is likely secondary to radiation proctitis and he has discussed the risks and benefits and methods of endoscopy and flex sig  -EGD done and showed "Normal larynx. Small hiatal hernia. A single gastric polyp. Atrophic gastritis. Normal duodenal bulb, first portion of the duodenum, second portion of the duodenum and third portion of the duodenum." -Flex Sig done showed stool in the mid sigmoid colon with no blood seen coming from above.  There is also mucosal ulceration that was treated with radiofrequency ablation and that was palpable with radiation damage and the examination was otherwise normal -Continue to monitor for signs and symptoms of bleeding; continue to monitor hemoglobin and hematocrit and trend -Dr. Driscilla Grammes recommended a soft diet as well as using MiraLAX twice a day to keep stools soft and recommending Canasa 1000  mg suppository per rectum nightly indefinitely -Hemoglobin/Hematocrit was stable at 9.7/30.1 and slightly dropped to 8.7/26.7  -Appreciate further GI evaluation and they have no new recommendations  AKI, improving  -BUN 34, creatinine 1.57 on Admission.   -Creatinine was 0.7 on 9/13.  Likely prerenal secondary to dehydration and home diuretic use. -IV fluid hydration with NS at 75 mL/hr to be continued but with Sodium Bicarbonate and now to stop  -BUN/Cr is now 9/0.85 -Continue to monitor renal function -Monitor urine output -Hold diuretics for now -Repeat CMP in AM   Bradycardia, PR prolongation -Bradycardic with heart rate in the 40s.  Not hypotensive.  -Atenolol and Cardizem listed in home medications. -Cardiac monitoring -Hold Atenolol and Cardizem for now  -Checked TSH and was 3.562 and free T4 was 1.18  Mild lactic acidosis -Lactic acid 2.5.  Suspect related to dehydration.  Does have a UTI but no fever or leukocytosis. -Received 1 L fluid bolus in the ED.  Continued IV fluid hydration with normal saline at a rate of 75 mL's per hour but then changed to sodium bicarbonate with 150 mEq of bicarbonate and dextrose 5% at 75 mL's per hour and now stopped  -Continue to trend lactate  Mild hyperkalemia, improving  -Potassium 5.5 on admission.  Likely related to AKI and home medications (spironolactone and potassium supplement). -Cardiac monitoring -Hold spironolactone and potassium supplement -K+ is  now 3.6 -Continue to monitor potassium level  Normal anion gap metabolic acidosis -Bicarb 19, anion gap 10 on Admission and now CO2 is now 25 and AG is now 9 with Chloride Level of 98.   -Likely related to home diuretic use and AKI. -Hold diuretic for now and continue to Monitor off of Fluid today  -Continue to monitor and repeat CMP in AM   Hyperbilirubinemia -T bili was 1.6 on Admission.   -Does have a history of cirrhosis but transaminases are normal. -Now T bili is 1.0  -Continue to Monitor and Trend -Repeat CMP in AM   Insulin-dependent diabetes mellitus -Checked A1c level and was 6.3.   -Sliding scale insulin sensitive and CBG checks. -CBG ranging from 233-281  Recent knee surgery Patient underwent right knee extensor mechanism repair on 11/22/2018.  Has a knee brace in place. -PT evaluation recommending SNF -Orthopedics evaluated and recommending removing her knee components of the Monday or Tuesday and will know more information when our schedule is available -Patient likely to have knee surgery this admission either tomorrow or Tuesday   Hyponatremia -Had improved and went from 131 -> 136 but is now dropped and trending back up again and was 132 -IVF Hydration now stopped  -Continue to Monitor and Trend -Repeat CMP in AM   Thrombocytopenia -In the setting of liver cirrhosis and likely splenic sequestration but worsened in the setting of bleeding -The patient platelet count went from 116,000 now 38,000 -In the setting of Bleeding and Infection  -Continue to monitor for signs and symptoms of bleeding and she is having some melena and some hematuria -Repeat CBC  Pancytopenia -From Liver Cirrhosis and likely dilutional drop?; Slightly worsening  -WBC is now 2.8, Hgb/HCt is now 8.7/26.7, and Platelet Count is now 38,000 -Continue to Monitor and Trend  -Repeat CBC in AM   Obesity -Estimated body mass index is 31.65 kg/m as calculated from the following:   Height as of this encounter: 5' 2"  (1.575 m).   Weight as of this encounter: 78.5 kg. -Weight Loss and Dietary Counseling given   Hypomagnesemia -Patient's magnesium level this morning was 1.9 -Continue to monitor replete as necessary -Repeat magnesium level in a.m.  DVT prophylaxis: SCDs Code Status: FULL CODE Family Communication: No family present at bedside but updated daughter over the telephone  Disposition Plan: Pending further workup and improvement back to baseline; Needs  SNF  Consultants:   Gastroenterology   Urology   Orthopedic Surgery    Procedures:  EGD Findings:      The larynx was normal.      A small hiatal hernia was present.      A single diminutive semi-sessile polyp was found in the cardia.      Diffuse minimal inflammation characterized by congestion (edema) was       found in the gastric body.      The duodenal bulb, first portion of the duodenum, second portion of the       duodenum and third portion of the duodenum were normal.      The exam was otherwise without abnormality. Impression:               - Normal larynx.                           - Small hiatal hernia.                           -  A single gastric polyp.                           - Atrophic gastritis.                           - Normal duodenal bulb, first portion of the                            duodenum, second portion of the duodenum and third                            portion of the duodenum.                           - The examination was otherwise normal. And                            specifically no signs of esophageal or gastric                            varices and no signs of portal gastropathy                           - No specimens collected.  Flex Sigmoidoscopy Findings:      A large amount of solid stool was found in the mid sigmoid colon,       interfering with visualization.      Oozing ulcerated mucosa with stigmata of recent bleeding were present in       the distal rectum. She also had some other areas of probable radiation       proctitis and we proceeded with focal radiofrequency ablation of her       radiation damage which was performed. With the endoscope in place,       Endoscopic visualization identified an ablation site. The radiofrequency       channel ablation catheter was introduced through the endoscope working       channel. . The endoscope with the ablation catheter was advanced to the       areas radiation mucosa. The  endoscope with the channel ablation catheter       was positioned under direct visualization so that the catheter was       placed in contact with the surface of the radiation mucosa. Energy was       applied x11 bursts only. The ablation catheter was removed through the       endoscope working channel. The areas of the rectum mucosa had been       ablated were examined and no further oozing was seen.      The exam was otherwise without abnormality. Impression:               - Stool in the mid sigmoid colon. No blood seen                            coming from above                           -  Mucosal ulceration. Treated with radiofrequency                            ablation. Patible with radiation damage                           - The examination was otherwise normal.                           - No specimens collected.  Antimicrobials:  Anti-infectives (From admission, onward)   Start     Dose/Rate Route Frequency Ordered Stop   01/02/19 1000  vancomycin (VANCOCIN) IVPB 1000 mg/200 mL premix     1,000 mg 200 mL/hr over 60 Minutes Intravenous Every 24 hours 01/01/19 1012     01/01/19 0845  vancomycin (VANCOCIN) 1,500 mg in sodium chloride 0.9 % 500 mL IVPB     1,500 mg 250 mL/hr over 120 Minutes Intravenous  Once 01/01/19 0833 01/01/19 1202   12/29/18 2200  cefTRIAXone (ROCEPHIN) 1 g in sodium chloride 0.9 % 100 mL IVPB     1 g 200 mL/hr over 30 Minutes Intravenous Every 24 hours 12/28/18 2240     12/29/18 1000  rifaximin (XIFAXAN) tablet 550 mg     550 mg Oral 2 times daily 12/28/18 2240     12/28/18 2200  cefTRIAXone (ROCEPHIN) 1 g in sodium chloride 0.9 % 100 mL IVPB     1 g 200 mL/hr over 30 Minutes Intravenous  Once 12/28/18 2153 12/28/18 2257     Subjective: Patient examined at bedside and she was still feeling a little bit sleepy today but was more awake and will answer questions appropriately.  States that she did not like the lactulose.  Had a small bowel movement  overnight and had a rough night because of her urinary catheter started leaking twice.  No nausea or vomiting.  Denies any abdominal pain.  No other concerns or complaints this time  Objective: Vitals:   12/31/18 1336 12/31/18 2049 01/01/19 0433 01/01/19 1235  BP: 121/67 (!) 143/62 129/60 123/65  Pulse: 90 88 79 86  Resp: 12 18 19 20   Temp: 98.1 F (36.7 C) 98.3 F (36.8 C) 97.8 F (36.6 C) 98 F (36.7 C)  TempSrc: Oral Oral Oral Oral  SpO2: 99% 99% 97% 97%  Weight:      Height:        Intake/Output Summary (Last 24 hours) at 01/01/2019 1355 Last data filed at 01/01/2019 1300 Gross per 24 hour  Intake 1552.69 ml  Output 900 ml  Net 652.69 ml   Filed Weights   12/29/18 0039 12/30/18 0659  Weight: 78.5 kg 78.5 kg   Examination: Physical Exam:  Constitutional: WN/WD obese Caucasian female in NAD and appears sleepy again but more alert than yesterday  Eyes: Lids and conjunctivae normal, sclerae anicteric  ENMT: External Ears, Nose appear normal. Grossly normal hearing. Mucous membranes are moist  Neck: Appears normal, supple, no cervical masses, normal ROM, no appreciable thyromegaly; no JVD Respiratory: Diminished to auscultation bilaterally, no wheezing, rales, rhonchi or crackles. Normal respiratory effort and patient is not tachypenic. No accessory muscle use. Unlabored breathing  Cardiovascular: RRR, no murmurs / rubs / gallops. S1 and S2 auscultated. Trace extremity edema.  Abdomen: Soft, non-tender, Distended due to body habitus. Bowel sounds positive x4.  GU: Deferred. Has a Purewick with Red Kool-Aid  Urine in Cannister  Musculoskeletal: Right Leg Immobilized in a Brace Skin: No rashes, lesions, ulcers on a limited skin evaluation. No induration; Warm and dry.  Neurologic: CN 2-12 grossly intact with no focal deficits. Romberg sign and cerebellar reflexes not assessed.  Psychiatric: Normal judgment and insight. Slightly drowsy but is alert oriented x 3. Slightly  depressed appearing mood and appropriate affect.   Data Reviewed: I have personally reviewed following labs and imaging studies  CBC: Recent Labs  Lab 12/28/18 1812 12/29/18 0413 12/30/18 0412 12/31/18 0520 01/01/19 0420  WBC 6.1 4.6 2.6* 3.0* 2.8*  NEUTROABS 3.8 2.6 1.4* 1.9 1.6*  HGB 11.8* 11.4* 10.0* 9.7* 8.7*  HCT 36.1 35.9* 31.4* 30.1* 26.7*  MCV 95.8 96.8 95.7 94.4 95.0  PLT 116* 68* 47* 45* 38*   Basic Metabolic Panel: Recent Labs  Lab 12/28/18 1812 12/29/18 0413 12/30/18 0412 12/31/18 0520 01/01/19 0420  NA 131* 136 134* 130* 132*  K 5.5* 5.0 4.7 3.9 3.6  CL 102 105 105 99 98  CO2 19* 19* 17* 21* 25  GLUCOSE 188* 103* 74 209* 221*  BUN 34* 34* 18 15 9   CREATININE 1.57* 1.32* 1.01* 1.04* 0.85  CALCIUM 9.2 8.9 8.9 8.6* 8.3*  MG  --  2.0 1.7 1.6* 1.9  PHOS  --  5.1* 3.5 4.2 4.0   GFR: Estimated Creatinine Clearance: 59.8 mL/min (by C-G formula based on SCr of 0.85 mg/dL). Liver Function Tests: Recent Labs  Lab 12/28/18 1812 12/29/18 0413 12/30/18 0412 12/31/18 0520 01/01/19 0420  AST 34  --  39 36 34  ALT 32  --  27 31 29   ALKPHOS 97  --  84 88 78  BILITOT 1.6* 1.4* 0.9 1.2 1.0  PROT 6.4*  --  5.4* 5.4* 4.9*  ALBUMIN 3.3*  --  2.9* 2.9* 2.6*   No results for input(s): LIPASE, AMYLASE in the last 168 hours. Recent Labs  Lab 12/28/18 1813 12/29/18 0413 12/31/18 0520 01/01/19 0420  AMMONIA 63* 57* 61* 38*   Coagulation Profile: Recent Labs  Lab 12/28/18 1812  INR 1.2   Cardiac Enzymes: No results for input(s): CKTOTAL, CKMB, CKMBINDEX, TROPONINI in the last 168 hours. BNP (last 3 results) No results for input(s): PROBNP in the last 8760 hours. HbA1C: No results for input(s): HGBA1C in the last 72 hours. CBG: Recent Labs  Lab 12/31/18 1137 12/31/18 1610 12/31/18 2106 01/01/19 0808 01/01/19 1137  GLUCAP 233* 261* 281* 176* 266*   Lipid Profile: No results for input(s): CHOL, HDL, LDLCALC, TRIG, CHOLHDL, LDLDIRECT in the last 72  hours. Thyroid Function Tests: No results for input(s): TSH, T4TOTAL, FREET4, T3FREE, THYROIDAB in the last 72 hours. Anemia Panel: No results for input(s): VITAMINB12, FOLATE, FERRITIN, TIBC, IRON, RETICCTPCT in the last 72 hours. Sepsis Labs: Recent Labs  Lab 12/28/18 1813 12/28/18 2240  LATICACIDVEN 2.5* 1.8    Recent Results (from the past 240 hour(s))  Urine culture     Status: Abnormal   Collection Time: 12/28/18  5:43 PM   Specimen: Urine, Random  Result Value Ref Range Status   Specimen Description   Final    URINE, RANDOM Performed at [facility] [address], [place], [place] [address]    Special Requests   Final    NONE Performed at [facility], [place] [address] [place], [place] [address]    Culture (A)  Final    20,000 COLONIES/mL CITROBACTER BRAAKII >=100,000 COLONIES/mL ENTEROCOCCUS RAFFINOSUS  Report Status 12/31/2018 FINAL  Final   Organism ID, Bacteria CITROBACTER BRAAKII (A)  Final   Organism ID, Bacteria ENTEROCOCCUS RAFFINOSUS (A)  Final      Susceptibility   Citrobacter braakii - MIC*    CEFAZOLIN >=64 RESISTANT Resistant     CEFTRIAXONE <=1 SENSITIVE Sensitive     CIPROFLOXACIN <=0.25 SENSITIVE Sensitive     GENTAMICIN <=1 SENSITIVE Sensitive     IMIPENEM 0.5 SENSITIVE Sensitive     NITROFURANTOIN 256 RESISTANT Resistant     TRIMETH/SULFA <=20 SENSITIVE Sensitive     PIP/TAZO <=4 SENSITIVE Sensitive     * 20,000 COLONIES/mL CITROBACTER BRAAKII   Enterococcus raffinosus - MIC*    AMPICILLIN 16 RESISTANT Resistant     LEVOFLOXACIN 4 INTERMEDIATE Intermediate     NITROFURANTOIN 32 SENSITIVE Sensitive     VANCOMYCIN <=0.5 SENSITIVE Sensitive     * >=100,000 COLONIES/mL ENTEROCOCCUS RAFFINOSUS  SARS CORONAVIRUS 2 (TAT 6-24 HRS) Nasopharyngeal Nasopharyngeal Swab     Status: None   Collection Time: 12/28/18 10:53 PM   Specimen: Nasopharyngeal Swab  Result Value Ref Range Status   SARS Coronavirus 2 NEGATIVE NEGATIVE  Final    Comment: (NOTE) SARS-CoV-2 target nucleic acids are NOT DETECTED. The SARS-CoV-2 RNA is generally detectable in upper and lower respiratory specimens during the acute phase of infection. Negative results do not preclude SARS-CoV-2 infection, do not rule out co-infections with other pathogens, and should not be used as the sole basis for treatment or other patient management decisions. Negative results must be combined with clinical observations, patient history, and epidemiological information. The expected result is Negative. Fact Sheet for Patients: SugarRoll.be Fact Sheet for Healthcare Providers: https://www.woods-mathews.com/ This test is not yet approved or cleared by the [place] FDA and  has been authorized for detection and/or diagnosis of SARS-CoV-2 by FDA under an Emergency Use Authorization (EUA). This EUA will remain  in effect (meaning this test can be used) for the duration of the COVID-19 declaration under Section 56 4(b)(1) of the Act, 21 U.S.C. section 360bbb-3(b)(1), unless the authorization is terminated or revoked sooner. Performed at [facility], [place] [address] [place], [place] [address]   MRSA PCR Screening     Status: None   Collection Time: 12/29/18  7:41 AM   Specimen: Nasal Mucosa; Nasopharyngeal  Result Value Ref Range Status   MRSA by PCR NEGATIVE NEGATIVE Final    Comment:        The GeneXpert MRSA Assay (FDA approved for NASAL specimens only), is one component of a comprehensive MRSA colonization surveillance program. It is not intended to diagnose MRSA infection nor to guide or monitor treatment for MRSA infections. Performed at [facility], [place] [address], [place], [place] [address]   Culture, blood (routine x 2)     Status: None (Preliminary result)   Collection Time: 12/29/18  9:08 AM   Specimen: BLOOD RIGHT HAND  Result Value Ref Range Status   Specimen  Description   Final    BLOOD RIGHT HAND Performed at [facility] [address], [place], [place] [address]    Special Requests   Final    BOTTLES DRAWN AEROBIC ONLY Blood Culture adequate volume Performed at [facility] [address] [place], [place] [address]    Culture   Final    NO GROWTH 2 DAYS Performed at [facility] [address], [place], [place] [address]    Report Status PENDING  Incomplete  Culture, blood (routine x 2)     Status: None (Preliminary result)   Collection Time: 12/29/18  9:08 AM   Specimen: BLOOD LEFT HAND  Result Value Ref Range Status   Specimen Description   Final    BLOOD LEFT HAND Performed at [facility] [address] [place], [place] [address]    Special Requests   Final    BOTTLES DRAWN AEROBIC ONLY Blood Culture results may not be optimal due to an inadequate volume of blood received in culture bottles Performed at [facility] [address], [place], [place] [address]    Culture   Final    NO GROWTH 2 DAYS Performed at [facility] [address], [place], [place] [address]    Report Status PENDING  Incomplete   Radiology Studies: No results found. Scheduled Meds: . ALPRAZolam  0.25 mg Oral BID  . buPROPion  150 mg Oral Daily  . DULoxetine  60 mg Oral Daily  . fluticasone  1 spray Each Nare Daily  . gabapentin  300 mg Oral BID  . insulin aspart  0-5 Units Subcutaneous QHS  . insulin aspart  0-9 Units Subcutaneous TID WC  . lactulose  20 g Oral BID  . loratadine  10 mg Oral Daily  . mesalamine  1,000 mg Rectal QHS  . montelukast  10 mg Oral QHS  . multivitamin with minerals  1 tablet Oral Daily  . polyethylene glycol  17 g Oral BID  . rifaximin  550 mg Oral BID   Continuous Infusions: . cefTRIAXone (ROCEPHIN)  IV 1 g (12/31/18 2200)  . lactated ringers Stopped (12/30/18 0853)  . [START ON 01/02/2019] vancomycin      LOS: 4  days   Kerney Elbe, DO [facility] PAGER is on [place]  If 7PM-7AM, please contact night-coverage [email or web address] Password [email or web address] 01/01/2019, 1:55 PM

## 2019-01-02 ENCOUNTER — Encounter (HOSPITAL_COMMUNITY): Payer: Self-pay | Admitting: Gastroenterology

## 2019-01-02 DIAGNOSIS — D696 Thrombocytopenia, unspecified: Secondary | ICD-10-CM

## 2019-01-02 LAB — CBC WITH DIFFERENTIAL/PLATELET
Abs Immature Granulocytes: 0.01 10*3/uL (ref 0.00–0.07)
Basophils Absolute: 0 10*3/uL (ref 0.0–0.1)
Basophils Relative: 1 %
Eosinophils Absolute: 0.1 10*3/uL (ref 0.0–0.5)
Eosinophils Relative: 4 %
HCT: 29 % — ABNORMAL LOW (ref 36.0–46.0)
Hemoglobin: 9.3 g/dL — ABNORMAL LOW (ref 12.0–15.0)
Immature Granulocytes: 0 %
Lymphocytes Relative: 22 %
Lymphs Abs: 0.6 10*3/uL — ABNORMAL LOW (ref 0.7–4.0)
MCH: 31 pg (ref 26.0–34.0)
MCHC: 32.1 g/dL (ref 30.0–36.0)
MCV: 96.7 fL (ref 80.0–100.0)
Monocytes Absolute: 0.4 10*3/uL (ref 0.1–1.0)
Monocytes Relative: 14 %
Neutro Abs: 1.6 10*3/uL — ABNORMAL LOW (ref 1.7–7.7)
Neutrophils Relative %: 59 %
Platelets: 39 10*3/uL — ABNORMAL LOW (ref 150–400)
RBC: 3 MIL/uL — ABNORMAL LOW (ref 3.87–5.11)
RDW: 15.9 % — ABNORMAL HIGH (ref 11.5–15.5)
WBC: 2.8 10*3/uL — ABNORMAL LOW (ref 4.0–10.5)
nRBC: 0 % (ref 0.0–0.2)

## 2019-01-02 LAB — PHOSPHORUS: Phosphorus: 3.9 mg/dL (ref 2.5–4.6)

## 2019-01-02 LAB — COMPREHENSIVE METABOLIC PANEL
ALT: 34 U/L (ref 0–44)
AST: 40 U/L (ref 15–41)
Albumin: 2.7 g/dL — ABNORMAL LOW (ref 3.5–5.0)
Alkaline Phosphatase: 85 U/L (ref 38–126)
Anion gap: 9 (ref 5–15)
BUN: 10 mg/dL (ref 8–23)
CO2: 24 mmol/L (ref 22–32)
Calcium: 8.7 mg/dL — ABNORMAL LOW (ref 8.9–10.3)
Chloride: 102 mmol/L (ref 98–111)
Creatinine, Ser: 0.68 mg/dL (ref 0.44–1.00)
GFR calc Af Amer: >60 mL/min (ref >60)
GFR calc non Af Amer: >60 mL/min (ref >60)
Glucose, Bld: 232 mg/dL — ABNORMAL HIGH (ref 70–99)
Potassium: 4.3 mmol/L (ref 3.5–5.1)
Sodium: 135 mmol/L (ref 135–145)
Total Bilirubin: 1.1 mg/dL (ref 0.3–1.2)
Total Protein: 5.1 g/dL — ABNORMAL LOW (ref 6.5–8.1)

## 2019-01-02 LAB — GLUCOSE, CAPILLARY
Glucose-Capillary: 181 mg/dL — ABNORMAL HIGH (ref 70–99)
Glucose-Capillary: 292 mg/dL — ABNORMAL HIGH (ref 70–99)
Glucose-Capillary: 398 mg/dL — ABNORMAL HIGH (ref 70–99)
Glucose-Capillary: 338 mg/dL — ABNORMAL HIGH (ref 70–99)
Glucose-Capillary: 290 mg/dL — ABNORMAL HIGH (ref 70–99)

## 2019-01-02 LAB — MAGNESIUM: Magnesium: 1.8 mg/dL (ref 1.7–2.4)

## 2019-01-02 MED ORDER — INSULIN ASPART 100 UNIT/ML ~~LOC~~ SOLN
4.0000 [IU] | Freq: Three times a day (TID) | SUBCUTANEOUS | Status: DC
  Administered 2019-01-02 – 2019-01-06 (×8): 4 [IU] via SUBCUTANEOUS

## 2019-01-02 MED ORDER — SODIUM CHLORIDE 0.9% IV SOLUTION: Freq: Once | INTRAVENOUS | Status: DC

## 2019-01-02 MED ORDER — PRO-STAT SUGAR FREE PO LIQD
30.0000 mL | Freq: Two times a day (BID) | ORAL | Status: DC
  Administered 2019-01-02 – 2019-01-06 (×7): 30 mL via ORAL
  Filled 2019-01-02 (×8): qty 30

## 2019-01-02 MED ORDER — MAGNESIUM SULFATE IN D5W 1-5 GM/100ML-% IV SOLN
1.0000 g | Freq: Once | INTRAVENOUS | Status: AC
  Administered 2019-01-02: 1 g via INTRAVENOUS
  Filled 2019-01-02: qty 100

## 2019-01-02 MED ORDER — ATENOLOL 50 MG PO TABS
50.0000 mg | ORAL_TABLET | Freq: Every day | ORAL | Status: DC
  Administered 2019-01-02 – 2019-01-06 (×5): 50 mg via ORAL
  Filled 2019-01-02 (×6): qty 1

## 2019-01-02 MED ORDER — INSULIN GLARGINE 100 UNIT/ML ~~LOC~~ SOLN
8.0000 [IU] | Freq: Every day | SUBCUTANEOUS | Status: DC
  Administered 2019-01-02 – 2019-01-04 (×3): 8 [IU] via SUBCUTANEOUS
  Filled 2019-01-02 (×4): qty 0.08

## 2019-01-02 NOTE — Progress Notes (Signed)
Initial Nutrition Assessment  DOCUMENTATION CODES:   Obesity unspecified  INTERVENTION:  - will order 30 mL Prostat BID, each supplement provides 100 kcal and 15 grams of protein. - continue to encourage PO intakes.    NUTRITION DIAGNOSIS:   Increased nutrient needs related to acute illness as evidenced by estimated needs.  GOAL:   Patient will meet greater than or equal to 90% of their needs  MONITOR:   PO intake, Supplement acceptance, Labs, Weight trends  REASON FOR ASSESSMENT:   Consult Assessment of nutrition requirement/status  ASSESSMENT:   73 y.o. female with medical history significant of liver cirrhosis secondary to NAFLD, insulin-dependent DM, depression, anxiety, HTN, hyperlipidemia, seizure disorder, and recent R knee extensor mechanism repair on 9/1. She presented to the ED from SNF for evaluation of AMS. Head CT negative for acute findings.  Per flow sheet review, patient consumed 50% of dinner on 10/9; 25% of lunch and 75% of dinner on 10/10; 25% of breakfast, 100% of lunch and dinner on 10/11; 100% of breakfast this AM.   Per chart review, current weight is 173 lb, weight on 9/1 was 161 lb, and weight on 8/5 was 177 lb.   Per notes: - acute hepatic encephalopathy--improving - liver cirrhosis without varices - UTI - melena with clots--now stable - AKI--improving - mild lactic acidosis - mild hyperkalemia--improving/resolved - metabolic acidosis - hyperbilirubinemia - failed knee replacement with plan for repeat surgery this admission - thrombocytopenia and pancytopenia   Labs reviewed; CBGs: 181, 292, and 398 mg/dl, Ca: 8.7 mg/dl. Medications reviewed; sliding scale novolog, 4 units novolog TID, 8 units lantus/day, 20 g lactulose BID, 1 g IV Mg sulfate x1 run 10/12, daily multivitamin with minerals.     NUTRITION - FOCUSED PHYSICAL EXAM:  unable to complete at this time.   Diet Order:   Diet Order            DIET SOFT Room service  appropriate? Yes; Fluid consistency: Thin  Diet effective now              EDUCATION NEEDS:   No education needs have been identified at this time  Skin:  Skin Assessment: Reviewed RN Assessment  Last BM:  10/10  Height:   Ht Readings from Last 1 Encounters:  12/30/18 5\' 2"  (1.575 m)    Weight:   Wt Readings from Last 1 Encounters:  12/30/18 78.5 kg    Ideal Body Weight:  50 kg  BMI:  Body mass index is 31.65 kg/m.  Estimated Nutritional Needs:   Kcal:  1600-1800  Protein:  75-85 grams  Fluid:  >/= 1.7 L/day      Jarome Matin, MS, RD, LDN, [facility] Inpatient Clinical Dietitian Pager # [phone] After hours/weekend pager # [phone]

## 2019-01-02 NOTE — Progress Notes (Signed)
Inpatient Diabetes Program Recommendations  AACE/ADA: New Consensus Statement on Inpatient Glycemic Control (2015)  Target Ranges:  Prepandial:   less than 140 mg/dL      Peak postprandial:   less than 180 mg/dL (1-2 hours)      Critically ill patients:  140 - 180 mg/dL   Results for Brittany Russo, Brittany Russo (MRN [id number]) as of 01/02/2019 10:28  Ref. Range 01/01/2019 08:08 01/01/2019 11:37 01/01/2019 16:26 01/01/2019 20:54  Glucose-Capillary Latest Ref Range: 70 - 99 mg/dL 176 (H)  2 units NOVOLOG  266 (H)  5 units NOVOLOG  250 (H)  3 units NOVOLOG  349 (H)  4 units NOVOLOG    Results for Brittany Russo, Brittany Russo (MRN [id number]) as of 01/02/2019 10:28  Ref. Range 01/02/2019 07:40  Glucose-Capillary Latest Ref Range: 70 - 99 mg/dL 181 (H)  2 units NOVOLOG      Home DM Meds: Glipizide 5 mg BID       Lantus 65 units Daily       Janumet 50/500 mg BID  Current Orders: Novolog Sensitive Correction Scale/ SSI (0-9 units) TID AC + HS     MD- Per PO documentation, pt eating 75-100% of meals.  Having elevated post-meal CBGs and AM CBGs slightly elevated as well.  Please consider the following:  1. Start Lantus 8 units QHS (0.1 units/kg)--Takes larger dose of Lantus at home but AM CBGs only mildly elevated   2. Start Novolog Meal Coverage:  Novolog 4 units TID with meals  (Please add the following Hold Parameters: Hold if pt eats <50% of meal, Hold if pt NPO)     --Will follow patient during hospitalization--  Wyn Quaker RN, MSN, CDE Diabetes Coordinator Inpatient Glycemic Control Team Team Pager: [phone] (8a-5p)

## 2019-01-02 NOTE — Progress Notes (Addendum)
Subjective: Patient reports that she is having surgery in her right knee tomorrow. Last bowel movement was 2 days ago.  Denies episodes of confusion.  Objective: Vital signs in last 24 hours: Temp:  [97.7 F (36.5 C)-98.2 F (36.8 C)] 97.7 F (36.5 C) (10/12 0645) Pulse Rate:  [86-95] 86 (10/12 0645) Resp:  [16-20] 18 (10/12 0645) BP: (123-138)/(62-76) 138/76 (10/12 0645) SpO2:  [97 %-99 %] 99 % (10/12 0645) Weight change:  Last BM Date: 12/31/18  PE: Lying on bed, appears comfortable, alert, oriented to time, place and person GENERAL: Mild pallor, no icterus  ABDOMEN: Soft, nondistended, nontender EXTREMITIES: Right lower extremity in braces with dressing underneath  Lab Results: Results for orders placed or performed during the hospital encounter of 12/28/18 (from the past 48 hour(s))  Glucose, capillary     Status: Abnormal   Collection Time: 12/31/18 11:37 AM  Result Value Ref Range   Glucose-Capillary 233 (H) 70 - 99 mg/dL   Comment 1 Notify RN    Comment 2 Document in Chart   Glucose, capillary     Status: Abnormal   Collection Time: 12/31/18  4:10 PM  Result Value Ref Range   Glucose-Capillary 261 (H) 70 - 99 mg/dL  Glucose, capillary     Status: Abnormal   Collection Time: 12/31/18  9:06 PM  Result Value Ref Range   Glucose-Capillary 281 (H) 70 - 99 mg/dL  Magnesium     Status: None   Collection Time: 01/01/19  4:20 AM  Result Value Ref Range   Magnesium 1.9 1.7 - 2.4 mg/dL    Comment: Performed at [facility], [place] [address], [place], [place] [address]  Phosphorus     Status: None   Collection Time: 01/01/19  4:20 AM  Result Value Ref Range   Phosphorus 4.0 2.5 - 4.6 mg/dL    Comment: Performed at [facility], [place] [address] [place], [place] [address]  CBC with Differential/Platelet     Status: Abnormal   Collection Time: 01/01/19  4:20 AM  Result Value Ref Range   WBC 2.8 (L) 4.0 - 10.5 K/uL   RBC 2.81 (L) 3.87 -  5.11 MIL/uL   Hemoglobin 8.7 (L) 12.0 - 15.0 g/dL   HCT 26.7 (L) 36.0 - 46.0 %   MCV 95.0 80.0 - 100.0 fL   MCH 31.0 26.0 - 34.0 pg   MCHC 32.6 30.0 - 36.0 g/dL   RDW 16.0 (H) 11.5 - 15.5 %   Platelets 38 (L) 150 - 400 K/uL    Comment: REPEATED TO VERIFY PLATELET COUNT CONFIRMED BY SMEAR SPECIMEN CHECKED FOR CLOTS Immature Platelet Fraction may be clinically indicated, consider ordering this additional test [id number]    nRBC 0.0 0.0 - 0.2 %   Neutrophils Relative % 58 %   Neutro Abs 1.6 (L) 1.7 - 7.7 K/uL   Lymphocytes Relative 22 %   Lymphs Abs 0.6 (L) 0.7 - 4.0 K/uL   Monocytes Relative 16 %   Monocytes Absolute 0.5 0.1 - 1.0 K/uL   Eosinophils Relative 3 %   Eosinophils Absolute 0.1 0.0 - 0.5 K/uL   Basophils Relative 1 %   Basophils Absolute 0.0 0.0 - 0.1 K/uL   Immature Granulocytes 0 %   Abs Immature Granulocytes 0.00 0.00 - 0.07 K/uL    Comment: Performed at [facility], [place] [address] [place], [place] [address]  Comprehensive metabolic panel     Status: Abnormal   Collection Time: 01/01/19  4:20 AM  Result Value Ref Range   Sodium 132 (L) 135 - 145 mmol/L   Potassium 3.6 3.5 - 5.1 mmol/L   Chloride 98 98 - 111 mmol/L   CO2 25 22 - 32 mmol/L   Glucose, Bld 221 (H) 70 - 99 mg/dL   BUN 9 8 - 23 mg/dL   Creatinine, Ser 0.85 0.44 - 1.00 mg/dL   Calcium 8.3 (L) 8.9 - 10.3 mg/dL   Total Protein 4.9 (L) 6.5 - 8.1 g/dL   Albumin 2.6 (L) 3.5 - 5.0 g/dL   AST 34 15 - 41 U/L   ALT 29 0 - 44 U/L   Alkaline Phosphatase 78 38 - 126 U/L   Total Bilirubin 1.0 0.3 - 1.2 mg/dL   GFR calc non Af Amer >60 >60 mL/min   GFR calc Af Amer >60 >60 mL/min   Anion gap 9 5 - 15    Comment: Performed at [facility], [place] [address], [place], [place] [address]  Ammonia     Status: Abnormal   Collection Time: 01/01/19  4:20 AM  Result Value Ref Range   Ammonia 38 (H) 9 - 35 umol/L    Comment: Performed at [facility], [place]  [address] [place], [place] [address]  Glucose, capillary     Status: Abnormal   Collection Time: 01/01/19  8:08 AM  Result Value Ref Range   Glucose-Capillary 176 (H) 70 - 99 mg/dL  Glucose, capillary     Status: Abnormal   Collection Time: 01/01/19 11:37 AM  Result Value Ref Range   Glucose-Capillary 266 (H) 70 - 99 mg/dL  Glucose, capillary     Status: Abnormal   Collection Time: 01/01/19  4:26 PM  Result Value Ref Range   Glucose-Capillary 250 (H) 70 - 99 mg/dL  Glucose, capillary     Status: Abnormal   Collection Time: 01/01/19  8:54 PM  Result Value Ref Range   Glucose-Capillary 349 (H) 70 - 99 mg/dL  Magnesium     Status: None   Collection Time: 01/02/19  4:49 AM  Result Value Ref Range   Magnesium 1.8 1.7 - 2.4 mg/dL    Comment: Performed at [facility], [place] [address], [place], [place] [address]  Phosphorus     Status: None   Collection Time: 01/02/19  4:49 AM  Result Value Ref Range   Phosphorus 3.9 2.5 - 4.6 mg/dL    Comment: Performed at [facility], [place] [address] [place], [place] [address]  CBC with Differential/Platelet     Status: Abnormal   Collection Time: 01/02/19  4:49 AM  Result Value Ref Range   WBC 2.8 (L) 4.0 - 10.5 K/uL   RBC 3.00 (L) 3.87 - 5.11 MIL/uL   Hemoglobin 9.3 (L) 12.0 - 15.0 g/dL   HCT 29.0 (L) 36.0 - 46.0 %   MCV 96.7 80.0 - 100.0 fL   MCH 31.0 26.0 - 34.0 pg   MCHC 32.1 30.0 - 36.0 g/dL   RDW 15.9 (H) 11.5 - 15.5 %   Platelets 39 (L) 150 - 400 K/uL    Comment: Immature Platelet Fraction may be clinically indicated, consider ordering this additional test [id number] CONSISTENT WITH PREVIOUS RESULT    nRBC 0.0 0.0 - 0.2 %   Neutrophils Relative % 59 %   Neutro Abs 1.6 (L) 1.7 - 7.7 K/uL   Lymphocytes Relative 22 %   Lymphs Abs 0.6 (L) 0.7 - 4.0 K/uL  Monocytes Relative 14 %   Monocytes Absolute 0.4 0.1 - 1.0 K/uL   Eosinophils Relative 4 %   Eosinophils Absolute 0.1 0.0 - 0.5 K/uL    Basophils Relative 1 %   Basophils Absolute 0.0 0.0 - 0.1 K/uL   Immature Granulocytes 0 %   Abs Immature Granulocytes 0.01 0.00 - 0.07 K/uL    Comment: Performed at [facility], [place] [address], [place], [place] [address]  Comprehensive metabolic panel     Status: Abnormal   Collection Time: 01/02/19  4:49 AM  Result Value Ref Range   Sodium 135 135 - 145 mmol/L   Potassium 4.3 3.5 - 5.1 mmol/L   Chloride 102 98 - 111 mmol/L   CO2 24 22 - 32 mmol/L   Glucose, Bld 232 (H) 70 - 99 mg/dL   BUN 10 8 - 23 mg/dL   Creatinine, Ser 0.68 0.44 - 1.00 mg/dL   Calcium 8.7 (L) 8.9 - 10.3 mg/dL   Total Protein 5.1 (L) 6.5 - 8.1 g/dL   Albumin 2.7 (L) 3.5 - 5.0 g/dL   AST 40 15 - 41 U/L   ALT 34 0 - 44 U/L   Alkaline Phosphatase 85 38 - 126 U/L   Total Bilirubin 1.1 0.3 - 1.2 mg/dL   GFR calc non Af Amer >60 >60 mL/min   GFR calc Af Amer >60 >60 mL/min   Anion gap 9 5 - 15    Comment: Performed at [facility], [place] [address] [place], [place] [address]  Glucose, capillary     Status: Abnormal   Collection Time: 01/02/19  7:40 AM  Result Value Ref Range   Glucose-Capillary 181 (H) 70 - 99 mg/dL    Studies/Results: No results found.  Medications: I have reviewed the patient's current medications.  Assessment: Cirrhosis(nodular surface of liver noted on CAT scan from 11/2018 with recanalized umbilical vein, splenomegaly, portal hypertension, perihepatic fluid) likely  related to [name] - no evidence of esophageal varices - history of ascites, 850 cc fluid removed in 09/2018 - history of hepatic encephalopathy which seems to have improved.  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 Patient  will need a bedpan as she is unable to ambulate/get out of bed.  Patient to be continued on mesalamine suppository at bedtime.  Possible OR tomorrow for extremely complicated right knee issue. Recommend platelet transfusion to keep platelet above 75,000 perioperatively.  We will sign off, please recall if needed.    Ronnette Juniper, MD 01/02/2019, 9:38 AM

## 2019-01-02 NOTE — Progress Notes (Signed)
Patient ID: Brittany Russo, female   DOB: 1945-04-09, 73 y.o.   MRN: [id number] Subjective: 3 Days Post-Op Procedure(s) (LRB): EGD (N/A) FLEXIBLE SIGMOIDOSCOPY (N/A) (RADIOFREQUENCY) ABLATION    Patient reports pain as mild.  No events.  Much improved alertness.  Conversant for the first time in awhile.  Objective:   VITALS:   Vitals:   01/01/19 2056 01/02/19 0645  BP: 129/62 138/76  Pulse: 95 86  Resp: 16 18  Temp: 98.2 F (36.8 C) 97.7 F (36.5 C)  SpO2: 99% 99%    Right knee brace still in place with fixed and flexed right knee Still with equinus contractures of bilateral ankles.  Active dorsiflexion better on her left than right   LABS Recent Labs    12/31/18 0520 01/01/19 0420 01/02/19 0449  HGB 9.7* 8.7* 9.3*  HCT 30.1* 26.7* 29.0*  WBC 3.0* 2.8* 2.8*  PLT 45* 38* 39*    Recent Labs    12/31/18 0520 01/01/19 0420 01/02/19 0449  NA 130* 132* 135  K 3.9 3.6 4.3  BUN 15 9 10   CREATININE 1.04* 0.85 0.68  GLUCOSE 209* 221* 232*    No results for input(s): LABPT, INR in the last 72 hours.   Assessment/Plan: Failed right knee replacement Thrombocytopenia  Plan: Would like to get her to the OR tomorrow But..  Need platelets over 80k so transfusions needed Not sure of etiology of this  May also help with chronic hematuria and melana  OR orders to follow Will discuss with family the plan later today

## 2019-01-02 NOTE — Care Management Important Message (Signed)
Important Message  Patient Details IM Letter given to Cookie McGibboney RN to present to the Patient Name: Brittany Russo MRN: [id number] Date of Birth: 1946/02/22   Medicare Important Message Given:  Yes     Kerin Salen 01/02/2019, 11:29 AM

## 2019-01-02 NOTE — Progress Notes (Signed)
PROGRESS NOTE    Brittany Russo  HUD:[id number] DOB: 07-Feb-1946 DOA: 12/28/2018 PCP: Rosalee Kaufman, PA-C   Brief Narrative:  HPI per Dr. Shela Leff on 12/28/2018 Brittany Russo is a 73 y.o. female with medical history significant of liver cirrhosis secondary to NAFLD, insulin-dependent diabetes mellitus, depression, anxiety, hypertension, hyperlipidemia, seizure disorder, recent right knee extensor mechanism repair on 11/22/2018 presenting to the hospital from her SNF for evaluation of AMS.  Patient is confused and slow to respond to questions.  She is not sure why she is here.  Oriented to self and knows it is October 2020.  Not oriented to place.  Denies fevers, chest pain, shortness of breath, cough, nausea, vomiting, abdominal pain, or dysuria.  No additional history could be obtained from her.  ED Course: Bradycardic with heart rate in the 40s.  Not hypotensive.  Afebrile and no leukocytosis.  Lactic acid 2.5.  Ammonia level 63, elevated compared to prior labs.  Hemoglobin 11.8, above baseline.  Platelet count 116,000, has chronic thrombocytopenia and platelet count is currently improved from baseline.  Sodium 131, chronically low and at baseline.  Potassium 5.5.  Bicarb 19, anion gap 10.  Blood glucose 188.  BUN 34, creatinine 1.5.  Creatinine was 0.7 on 9/13.  T bili 1.6, remainder of LFTs normal.  INR 1.2.  UA with large amount of leukocytes, greater than 50 RBCs, greater than 50 WBCs, and many bacteria on microscopic examination.  Urine culture pending.  VBG with pH 7.34.  Head CT negative for acute finding. Patient received ceftriaxone, lactulose, and 1 L normal saline bolus.  **Interim History  Patient started having some hematuria and some melena so GI and urology were consulted.  Encephalopathy appears to be improving.  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  Dr.  Brien Mates recommended a soft diet and recommended mesalamine suppositories.  Urology feels that the patient stable for outpatient cystoscopy and recommended no inpatient intervention at this time.  Orthopedics evaluated the patient's case and are planning to take out the patient's knee component tomorrow (Tuesday) because they are worried about her skin breaking down over her femoral component.  Nutritionist was consulted as well for poor p.o. intake  Assessment & Plan:   Principal Problem:   Hepatic encephalopathy ([place]) Active Problems:   AKI (acute kidney injury) ([place])   UTI (urinary tract infection)   Bradycardia   Hyperkalemia  Acute Hepatic Encephalopathy, improving  Decompensated liver cirrhosis without varices -History of liver cirrhosis secondary to NAFLD -Patient was confused and slow to respond to questions and on admission was Oriented to self and knows it is October 2020.  Not oriented to place and does not know why she is here.;  Mentation is much improved  -Ammonia level 63 on admission and was elevated compared to prior labs and is improved to 57 but elevated to 61 -No lactulose listed in home medications.  Only rifaximin is listed.   -Stroke less likely as head CT negative and neuro exam nonfocal.   -Does have a UTI with hematuria which is also likely contributing to the patient's encephalopathy.   -In addition, polypharmacy could also be contributing as Xanax, Norco, and Robaxin listed in home medications. -Lactulose started at 20 g po TIDprn to maintain 2-3 BM's daily changed to 20 mg p.o. twice daily scheduled for now; GI recommending Continuing Plan of Care and recommending a  Bed Pan -Continue Rifaximin -Has had a Hx of Ascites with 850 mL Fluid Removed -Repeat ammonia level yesterday AM showed improvement and was 38 -Treatment of UTI as mentioned below -Hold sedating medications -Continue to monitor mental status carefully   Citrobacter Braakii and Enterococcus  Raffinosus UTI with Hematuria, poA -Afebrile no leukocytosis but has Leukopenia .   -Lactic acid mildly elevated, dehydration could be contributing.   -UA with large amount of leukocytes, greater than 50 RBCs, greater than 50 WBCs, and many bacteria on microscopic examination. -Continue IV ceftriaxone and added IV Vancomycin for Enterococcus Raffinosus  -Urine culture pending along with Blood Cx; Urine Cx grew 20,000 CFU of Citrobacter Braakii sensitive to Ceftriaxone and >100,000 CFU of Enterococcus Raffinosus that was Resistant to most Abx but Sensitive to IV Vancomycin  -Urology consulted for further evaluation given her Hematuria and Dr. Alyson Ingles evaluated yesterday and he requested the urine is sent for cystology but likely meant cytology.  He recommends that she be scheduled for an outpatient cystoscopy because first hemoglobin is currently stable and she is emptying well we do not feel like any further intervention at this point was necessary for her gross hematuria -Family consistently pushing for a cystoscopy by urology still saying that this needs to be done in outpatient setting and will defer to them -C/w IV Ceftriaxone and IV Vancomycin   Melena with Clots, stable now   -Dr. Watt Climes was consulted for further evaluation recommendations and feels that this is likely secondary to radiation proctitis and he has discussed the risks and benefits and methods of endoscopy and flex sig  -EGD done and showed "Normal larynx. Small hiatal hernia. A single gastric polyp. Atrophic gastritis. Normal duodenal bulb, first portion of the duodenum, second portion of the duodenum and third portion of the duodenum." -Flex Sig done showed stool in the mid sigmoid colon with no blood seen coming from above.  There is also mucosal ulceration that was treated with radiofrequency ablation and that was palpable with radiation damage and the examination was otherwise normal -Continue to monitor for signs and symptoms  of bleeding; continue to monitor hemoglobin and hematocrit and trend -Dr. Driscilla Grammes recommended a soft diet as well as using MiraLAX twice a day to keep stools soft and recommending Canasa 1000 mg suppository per rectum nightly indefinitely -Hemoglobin/Hematocrit is now 9.3/29.0 -Appreciate further GI evaluation and they have no new recommendations  AKI, improving  -BUN 34, creatinine 1.57 on Admission.   -Creatinine was 0.7 on 9/13.  Likely prerenal secondary to dehydration and home diuretic use. -IVF now stopped -BUN/Cr is now 10/0.68 -Continue to monitor renal function -Monitor urine output -Hold diuretics for now -Repeat CMP in AM   Bradycardia, PR prolongation -Bradycardic with heart rate in the 40s.  Not hypotensive.  -Atenolol and Cardizem listed in home medications. -Continue Cardiac monitoring; HR was 95 this AM so will resume Atenolol -Hold Cardizem for now  -Checked TSH and was 3.562 and free T4 was 1.18  Mild lactic acidosis -Lactic acid 2.5.  Suspect related to dehydration.  Does have a UTI but no fever or leukocytosis. -Received 1 L fluid bolus in the ED.  Continued IV fluid hydration with normal saline at a rate of 75 mL's per hour but then changed to sodium bicarbonate with 150 mEq of bicarbonate and dextrose 5% at 75 mL's per hour but IVF is now stopped  -Continue to trend lactate  Mild hyperkalemia, improving  -Potassium 5.5 on admission.  Likely related to  AKI and home medications (spironolactone and potassium supplement). -Cardiac monitoring -Hold spironolactone and potassium supplement -K+ is now 4.3 -Continue to monitor potassium level  Normal anion gap metabolic acidosis -Bicarb 19, anion gap 10 on Admission and now CO2 is now 24 and AG is now 10 with Chloride Level of 102.   -Likely related to home diuretic use and AKI. -Hold diuretic for now and continue to Monitor off of Fluid today  -Continue to monitor and repeat CMP in AM   Hyperbilirubinemia  -T bili was 1.6 on Admission.   -Does have a history of cirrhosis but transaminases are normal. -Now T bili is 1.1 -Continue to Monitor and Trend -Repeat CMP in AM   Insulin-dependent diabetes mellitus -Checked A1c level and was 6.3.   -Sliding scale insulin sensitive and CBG checks. -CBG ranging from 181-292 -Diabetes Education Coordinator Consulted and recommending Lantus 8 units qHS and starting Novolog Meal Coverage with 4 units TID with meals  Recent knee surgery with Failed Knee Replacement  -Patient underwent right knee extensor mechanism repair on 11/22/2018.  Has a knee brace in place. -PT evaluation recommending SNF -Orthopedics evaluated and recommending removing her knee components but Dr. Alvan Dame requests that her Platelets be >80,000 so will transfuse her 2 Pools of Platelets today given that they are 39,000 this AM  -Patient likely to have knee surgery this admission either tomorrow or Tuesday   Hyponatremia -Now Na+ is 135 -IVF Hydration now stopped  -Continue to Monitor and Trend -Repeat CMP in AM   Thrombocytopenia -In the setting of liver cirrhosis and likely splenic sequestration but worsened in the setting of bleeding and hematuria -The patient platelet count went from 116,000 now 39,000 -In the setting of Bleeding and Infection -Will type and screen and transfuse 2 Pools of Platelets in anticipation for  -Continue to monitor for signs and symptoms of bleeding and she is having some melena and some hematuria -Repeat CBC  Pancytopenia -From Liver Cirrhosis and likely dilutional drop?; Slightly worsening  -WBC is now 2.8, Hgb/HCt is now 9.3/29.0, and Platelet Count is now 39,000 -Will be getting 2 Pools of Platelets today given Orthopedic Request -Continue to Monitor and Trend  -Repeat CBC in AM   Obesity -Estimated body mass index is 31.65 kg/m as calculated from the following:   Height as of this encounter: 5' 2"  (1.575 m).   Weight as of this encounter:  78.5 kg. -Weight Loss and Dietary Counseling given   Hypomagnesemia -Patient's magnesium level this morning was 1.8 -Continue to monitor replete as necessary -Repeat magnesium level in a.m.  DVT prophylaxis: SCDs Code Status: FULL CODE Family Communication: No family present at bedside Disposition Plan: Pending further workup and improvement back to baseline; Needs SNF  Consultants:   Gastroenterology   Urology   Orthopedic Surgery    Procedures:  EGD Findings:      The larynx was normal.      A small hiatal hernia was present.      A single diminutive semi-sessile polyp was found in the cardia.      Diffuse minimal inflammation characterized by congestion (edema) was       found in the gastric body.      The duodenal bulb, first portion of the duodenum, second portion of the       duodenum and third portion of the duodenum were normal.      The exam was otherwise without abnormality. Impression:               -  Normal larynx.                           - Small hiatal hernia.                           - A single gastric polyp.                           - Atrophic gastritis.                           - Normal duodenal bulb, first portion of the                            duodenum, second portion of the duodenum and third                            portion of the duodenum.                           - The examination was otherwise normal. And                            specifically no signs of esophageal or gastric                            varices and no signs of portal gastropathy                           - No specimens collected.  Flex Sigmoidoscopy Findings:      A large amount of solid stool was found in the mid sigmoid colon,       interfering with visualization.      Oozing ulcerated mucosa with stigmata of recent bleeding were present in       the distal rectum. She also had some other areas of probable radiation       proctitis and we proceeded with focal  radiofrequency ablation of her       radiation damage which was performed. With the endoscope in place,       Endoscopic visualization identified an ablation site. The radiofrequency       channel ablation catheter was introduced through the endoscope working       channel. . The endoscope with the ablation catheter was advanced to the       areas radiation mucosa. The endoscope with the channel ablation catheter       was positioned under direct visualization so that the catheter was       placed in contact with the surface of the radiation mucosa. Energy was       applied x11 bursts only. The ablation catheter was removed through the       endoscope working channel. The areas of the rectum mucosa had been       ablated were examined and no further oozing was seen.      The exam was otherwise without abnormality. Impression:               - Stool in the mid sigmoid colon.  No blood seen                            coming from above                           - Mucosal ulceration. Treated with radiofrequency                            ablation. Patible with radiation damage                           - The examination was otherwise normal.                           - No specimens collected.  Antimicrobials:  Anti-infectives (From admission, onward)   Start     Dose/Rate Route Frequency Ordered Stop   01/02/19 1000  vancomycin (VANCOCIN) IVPB 1000 mg/200 mL premix     1,000 mg 200 mL/hr over 60 Minutes Intravenous Every 24 hours 01/01/19 1012     01/01/19 0845  vancomycin (VANCOCIN) 1,500 mg in sodium chloride 0.9 % 500 mL IVPB     1,500 mg 250 mL/hr over 120 Minutes Intravenous  Once 01/01/19 0833 01/01/19 1204   12/29/18 2200  cefTRIAXone (ROCEPHIN) 1 g in sodium chloride 0.9 % 100 mL IVPB     1 g 200 mL/hr over 30 Minutes Intravenous Every 24 hours 12/28/18 2240     12/29/18 1000  rifaximin (XIFAXAN) tablet 550 mg     550 mg Oral 2 times daily 12/28/18 2240     12/28/18 2200   cefTRIAXone (ROCEPHIN) 1 g in sodium chloride 0.9 % 100 mL IVPB     1 g 200 mL/hr over 30 Minutes Intravenous  Once 12/28/18 2153 12/28/18 2257     Subjective: Patient examined at bedside and and she is doing well this morning and states he slept well with no issues.  No chest pain, admits or dizziness.  No other concerns or plans at this time. Understand she will be going for surgery tomorrow.   Objective: Vitals:   01/01/19 1235 01/01/19 2056 01/02/19 0645 01/02/19 1203  BP: 123/65 129/62 138/76 133/76  Pulse: 86 95 86 95  Resp: 20 16 18 16   Temp: 98 F (36.7 C) 98.2 F (36.8 C) 97.7 F (36.5 C) 98.2 F (36.8 C)  TempSrc: Oral Oral Oral Oral  SpO2: 97% 99% 99% 99%  Weight:      Height:        Intake/Output Summary (Last 24 hours) at 01/02/2019 1311 Last data filed at 01/02/2019 1040 Gross per 24 hour  Intake 1100 ml  Output 750 ml  Net 350 ml   Filed Weights   12/29/18 0039 12/30/18 0659  Weight: 78.5 kg 78.5 kg   Examination: Physical Exam:  Constitutional: WN/WD obese Caucasian female in NAD and appears calm and comfortable Eyes: Lids and conjunctivae normal, sclerae anicteric  ENMT: External Ears, Nose appear normal. Grossly normal hearing. Mucous membranes are moist.  Neck: Appears normal, supple, no cervical masses, normal ROM, no appreciable thyromegaly; no JVD Respiratory: Diminished to auscultation bilaterally, no wheezing, rales, rhonchi or crackles. Normal respiratory effort and patient is not tachypenic. No accessory muscle use.  Cardiovascular: RRR, no murmurs / rubs / gallops. S1 and  S2 auscultated. Trace extremity edema Abdomen: Soft, non-tender, Distended due to body habitus. Bowel sounds positive x4.  GU: Deferred. Musculoskeletal: Right Leg immobilized and has bilateral ankle contractures  Skin: No rashes, lesions, ulcers. No induration; Warm and dry.  Neurologic: CN 2-12 grossly intact with no focal deficits. Romberg sign and cerebellar reflexes  not assessed.  Psychiatric: Normal judgment and insight. Alert and oriented x 3. Pleasant mood and appropriate affect.   Data Reviewed: I have personally reviewed following labs and imaging studies  CBC: Recent Labs  Lab 12/29/18 0413 12/30/18 0412 12/31/18 0520 01/01/19 0420 01/02/19 0449  WBC 4.6 2.6* 3.0* 2.8* 2.8*  NEUTROABS 2.6 1.4* 1.9 1.6* 1.6*  HGB 11.4* 10.0* 9.7* 8.7* 9.3*  HCT 35.9* 31.4* 30.1* 26.7* 29.0*  MCV 96.8 95.7 94.4 95.0 96.7  PLT 68* 47* 45* 38* 39*   Basic Metabolic Panel: Recent Labs  Lab 12/29/18 0413 12/30/18 0412 12/31/18 0520 01/01/19 0420 01/02/19 0449  NA 136 134* 130* 132* 135  K 5.0 4.7 3.9 3.6 4.3  CL 105 105 99 98 102  CO2 19* 17* 21* 25 24  GLUCOSE 103* 74 209* 221* 232*  BUN 34* 18 15 9 10   CREATININE 1.32* 1.01* 1.04* 0.85 0.68  CALCIUM 8.9 8.9 8.6* 8.3* 8.7*  MG 2.0 1.7 1.6* 1.9 1.8  PHOS 5.1* 3.5 4.2 4.0 3.9   GFR: Estimated Creatinine Clearance: 63.5 mL/min (by C-G formula based on SCr of 0.68 mg/dL). Liver Function Tests: Recent Labs  Lab 12/28/18 1812 12/29/18 0413 12/30/18 0412 12/31/18 0520 01/01/19 0420 01/02/19 0449  AST 34  --  39 36 34 40  ALT 32  --  27 31 29  34  ALKPHOS 97  --  84 88 78 85  BILITOT 1.6* 1.4* 0.9 1.2 1.0 1.1  PROT 6.4*  --  5.4* 5.4* 4.9* 5.1*  ALBUMIN 3.3*  --  2.9* 2.9* 2.6* 2.7*   No results for input(s): LIPASE, AMYLASE in the last 168 hours. Recent Labs  Lab 12/28/18 1813 12/29/18 0413 12/31/18 0520 01/01/19 0420  AMMONIA 63* 57* 61* 38*   Coagulation Profile: Recent Labs  Lab 12/28/18 1812  INR 1.2   Cardiac Enzymes: No results for input(s): CKTOTAL, CKMB, CKMBINDEX, TROPONINI in the last 168 hours. BNP (last 3 results) No results for input(s): PROBNP in the last 8760 hours. HbA1C: No results for input(s): HGBA1C in the last 72 hours. CBG: Recent Labs  Lab 01/01/19 1137 01/01/19 1626 01/01/19 2054 01/02/19 0740 01/02/19 1104  GLUCAP 266* 250* 349* 181* 292*    Lipid Profile: No results for input(s): CHOL, HDL, LDLCALC, TRIG, CHOLHDL, LDLDIRECT in the last 72 hours. Thyroid Function Tests: No results for input(s): TSH, T4TOTAL, FREET4, T3FREE, THYROIDAB in the last 72 hours. Anemia Panel: No results for input(s): VITAMINB12, FOLATE, FERRITIN, TIBC, IRON, RETICCTPCT in the last 72 hours. Sepsis Labs: Recent Labs  Lab 12/28/18 1813 12/28/18 2240  LATICACIDVEN 2.5* 1.8    Recent Results (from the past 240 hour(s))  Urine culture     Status: Abnormal   Collection Time: 12/28/18  5:43 PM   Specimen: Urine, Random  Result Value Ref Range Status   Specimen Description   Final    URINE, RANDOM Performed at [facility] [address] [place], [place] [address]    Special Requests   Final    NONE Performed at [facility], [place] [address], [place], [place] [address]    Culture (A)  Final  20,000 COLONIES/mL CITROBACTER BRAAKII >=100,000 COLONIES/mL ENTEROCOCCUS RAFFINOSUS    Report Status 12/31/2018 FINAL  Final   Organism ID, Bacteria CITROBACTER BRAAKII (A)  Final   Organism ID, Bacteria ENTEROCOCCUS RAFFINOSUS (A)  Final      Susceptibility   Citrobacter braakii - MIC*    CEFAZOLIN >=64 RESISTANT Resistant     CEFTRIAXONE <=1 SENSITIVE Sensitive     CIPROFLOXACIN <=0.25 SENSITIVE Sensitive     GENTAMICIN <=1 SENSITIVE Sensitive     IMIPENEM 0.5 SENSITIVE Sensitive     NITROFURANTOIN 256 RESISTANT Resistant     TRIMETH/SULFA <=20 SENSITIVE Sensitive     PIP/TAZO <=4 SENSITIVE Sensitive     * 20,000 COLONIES/mL CITROBACTER BRAAKII   Enterococcus raffinosus - MIC*    AMPICILLIN 16 RESISTANT Resistant     LEVOFLOXACIN 4 INTERMEDIATE Intermediate     NITROFURANTOIN 32 SENSITIVE Sensitive     VANCOMYCIN <=0.5 SENSITIVE Sensitive     * >=100,000 COLONIES/mL ENTEROCOCCUS RAFFINOSUS  SARS CORONAVIRUS 2 (TAT 6-24 HRS) Nasopharyngeal Nasopharyngeal Swab     Status: None   Collection Time: 12/28/18 10:53 PM    Specimen: Nasopharyngeal Swab  Result Value Ref Range Status   SARS Coronavirus 2 NEGATIVE NEGATIVE Final    Comment: (NOTE) SARS-CoV-2 target nucleic acids are NOT DETECTED. The SARS-CoV-2 RNA is generally detectable in upper and lower respiratory specimens during the acute phase of infection. Negative results do not preclude SARS-CoV-2 infection, do not rule out co-infections with other pathogens, and should not be used as the sole basis for treatment or other patient management decisions. Negative results must be combined with clinical observations, patient history, and epidemiological information. The expected result is Negative. Fact Sheet for Patients: SugarRoll.be Fact Sheet for Healthcare Providers: https://www.woods-mathews.com/ This test is not yet approved or cleared by the [place] FDA and  has been authorized for detection and/or diagnosis of SARS-CoV-2 by FDA under an Emergency Use Authorization (EUA). This EUA will remain  in effect (meaning this test can be used) for the duration of the COVID-19 declaration under Section 56 4(b)(1) of the Act, 21 U.S.C. section 360bbb-3(b)(1), unless the authorization is terminated or revoked sooner. Performed at [facility], [place] [address], [place], [place] [address]   MRSA PCR Screening     Status: None   Collection Time: 12/29/18  7:41 AM   Specimen: Nasal Mucosa; Nasopharyngeal  Result Value Ref Range Status   MRSA by PCR NEGATIVE NEGATIVE Final    Comment:        The GeneXpert MRSA Assay (FDA approved for NASAL specimens only), is one component of a comprehensive MRSA colonization surveillance program. It is not intended to diagnose MRSA infection nor to guide or monitor treatment for MRSA infections. Performed at [facility], [place] [address] [place], [place] [address]   Culture, blood (routine x 2)     Status: None (Preliminary result)    Collection Time: 12/29/18  9:08 AM   Specimen: BLOOD RIGHT HAND  Result Value Ref Range Status   Specimen Description   Final    BLOOD RIGHT HAND Performed at [facility] [address] [place], [place] [address]    Special Requests   Final    BOTTLES DRAWN AEROBIC ONLY Blood Culture adequate volume Performed at [facility] [address], [place], [place] [address]    Culture   Final    NO GROWTH 4 DAYS Performed at [facility], [place]  [address] [place], [place] [address]    Report Status PENDING  Incomplete  Culture, blood (routine x 2)     Status: None (Preliminary result)   Collection Time: 12/29/18  9:08 AM   Specimen: BLOOD LEFT HAND  Result Value Ref Range Status   Specimen Description   Final    BLOOD LEFT HAND Performed at [facility] [address] [place], [place] [address]    Special Requests   Final    BOTTLES DRAWN AEROBIC ONLY Blood Culture results may not be optimal due to an inadequate volume of blood received in culture bottles Performed at [facility] [address], [place], [place] [address]    Culture   Final    NO GROWTH 4 DAYS Performed at [facility], [place] [address] [place], [place] [address]    Report Status PENDING  Incomplete   Radiology Studies: No results found. Scheduled Meds: . sodium chloride   Intravenous Once  . ALPRAZolam  0.25 mg Oral BID  . buPROPion  150 mg Oral Daily  . DULoxetine  60 mg Oral Daily  . fluticasone  1 spray Each Nare Daily  . gabapentin  300 mg Oral BID  . insulin aspart  0-5 Units Subcutaneous QHS  . insulin aspart  0-9 Units Subcutaneous TID WC  . lactulose  20 g Oral BID  . loratadine  10 mg Oral Daily  . mesalamine  1,000 mg Rectal QHS  . montelukast  10 mg Oral QHS  . multivitamin with minerals  1 tablet Oral Daily  . rifaximin  550 mg Oral BID   Continuous Infusions: . cefTRIAXone (ROCEPHIN)  IV 1 g  (01/01/19 2323)  . lactated ringers Stopped (12/30/18 0853)  . vancomycin      LOS: 5 days   Kerney Elbe, DO [facility] PAGER is on [place]  If 7PM-7AM, please contact night-coverage [email or web address] Password [email or web address] 01/02/2019, 1:11 PM

## 2019-01-03 ENCOUNTER — Inpatient Hospital Stay (HOSPITAL_COMMUNITY): Payer: Medicare Other | Admitting: Certified Registered Nurse Anesthetist

## 2019-01-03 ENCOUNTER — Other Ambulatory Visit: Payer: Self-pay

## 2019-01-03 ENCOUNTER — Encounter (HOSPITAL_COMMUNITY): Payer: Self-pay

## 2019-01-03 ENCOUNTER — Encounter (HOSPITAL_COMMUNITY)
Admission: EM | Disposition: A | Discharge status: Skilled Nursing Facility | Payer: Self-pay | Attending: Internal Medicine

## 2019-01-03 DIAGNOSIS — T84012D Broken internal right knee prosthesis, subsequent encounter: Secondary | ICD-10-CM

## 2019-01-03 DIAGNOSIS — T84012A Broken internal right knee prosthesis, initial encounter: Secondary | ICD-10-CM

## 2019-01-03 DIAGNOSIS — R739 Hyperglycemia, unspecified: Secondary | ICD-10-CM

## 2019-01-03 HISTORY — PX: EXCISIONAL TOTAL KNEE ARTHROPLASTY: SHX5015

## 2019-01-03 LAB — CBC WITH DIFFERENTIAL/PLATELET
Abs Immature Granulocytes: 0.02 10*3/uL (ref 0.00–0.07)
Basophils Absolute: 0 10*3/uL (ref 0.0–0.1)
Basophils Relative: 1 %
Eosinophils Absolute: 0.3 10*3/uL (ref 0.0–0.5)
Eosinophils Relative: 9 %
HCT: 31.9 % — ABNORMAL LOW (ref 36.0–46.0)
Hemoglobin: 10.1 g/dL — ABNORMAL LOW (ref 12.0–15.0)
Immature Granulocytes: 1 %
Lymphocytes Relative: 24 %
Lymphs Abs: 0.7 10*3/uL (ref 0.7–4.0)
MCH: 30.7 pg (ref 26.0–34.0)
MCHC: 31.7 g/dL (ref 30.0–36.0)
MCV: 97 fL (ref 80.0–100.0)
Monocytes Absolute: 0.4 10*3/uL (ref 0.1–1.0)
Monocytes Relative: 13 %
Neutro Abs: 1.6 10*3/uL — ABNORMAL LOW (ref 1.7–7.7)
Neutrophils Relative %: 52 %
Platelets: 76 10*3/uL — ABNORMAL LOW (ref 150–400)
RBC: 3.29 MIL/uL — ABNORMAL LOW (ref 3.87–5.11)
RDW: 15.5 % (ref 11.5–15.5)
WBC: 3.1 10*3/uL — ABNORMAL LOW (ref 4.0–10.5)
nRBC: 0 % (ref 0.0–0.2)

## 2019-01-03 LAB — COMPREHENSIVE METABOLIC PANEL
ALT: 44 U/L (ref 0–44)
AST: 53 U/L — ABNORMAL HIGH (ref 15–41)
Albumin: 2.9 g/dL — ABNORMAL LOW (ref 3.5–5.0)
Alkaline Phosphatase: 105 U/L (ref 38–126)
Anion gap: 8 (ref 5–15)
BUN: 8 mg/dL (ref 8–23)
CO2: 24 mmol/L (ref 22–32)
Calcium: 8.8 mg/dL — ABNORMAL LOW (ref 8.9–10.3)
Chloride: 101 mmol/L (ref 98–111)
Creatinine, Ser: 0.61 mg/dL (ref 0.44–1.00)
GFR calc Af Amer: >60 mL/min (ref >60)
GFR calc non Af Amer: >60 mL/min (ref >60)
Glucose, Bld: 164 mg/dL — ABNORMAL HIGH (ref 70–99)
Potassium: 4.3 mmol/L (ref 3.5–5.1)
Sodium: 133 mmol/L — ABNORMAL LOW (ref 135–145)
Total Bilirubin: 1.2 mg/dL (ref 0.3–1.2)
Total Protein: 5.7 g/dL — ABNORMAL LOW (ref 6.5–8.1)

## 2019-01-03 LAB — CULTURE, BLOOD (ROUTINE X 2)
Culture: NO GROWTH
Culture: NO GROWTH
Special Requests: ADEQUATE

## 2019-01-03 LAB — PREPARE PLATELET PHERESIS
Unit division: 0
Unit division: 0

## 2019-01-03 LAB — MAGNESIUM: Magnesium: 1.9 mg/dL (ref 1.7–2.4)

## 2019-01-03 LAB — PHOSPHORUS: Phosphorus: 3.7 mg/dL (ref 2.5–4.6)

## 2019-01-03 LAB — GLUCOSE, CAPILLARY
Glucose-Capillary: 145 mg/dL — ABNORMAL HIGH (ref 70–99)
Glucose-Capillary: 143 mg/dL — ABNORMAL HIGH (ref 70–99)
Glucose-Capillary: 144 mg/dL — ABNORMAL HIGH (ref 70–99)
Glucose-Capillary: 139 mg/dL — ABNORMAL HIGH (ref 70–99)
Glucose-Capillary: 181 mg/dL — ABNORMAL HIGH (ref 70–99)

## 2019-01-03 LAB — BPAM PLATELET PHERESIS
Blood Product Expiration Date: 202010142359
Blood Product Expiration Date: 202010152359
ISSUE DATE / TIME: 202010121427
ISSUE DATE / TIME: 202010121829
Unit Type and Rh: 5100
Unit Type and Rh: 6200

## 2019-01-03 LAB — AMMONIA: Ammonia: 34 umol/L (ref 9–35)

## 2019-01-03 LAB — SURGICAL PCR SCREEN
MRSA, PCR: NEGATIVE
Staphylococcus aureus: NEGATIVE

## 2019-01-03 SURGERY — EXCISIONAL TOTAL KNEE ARTHROPLASTY
Anesthesia: Regional | Site: Knee | Laterality: Right

## 2019-01-03 MED ORDER — MAGNESIUM CITRATE PO SOLN: 1.0000 | Freq: Once | ORAL | Status: DC | PRN

## 2019-01-03 MED ORDER — KETOROLAC TROMETHAMINE 30 MG/ML IJ SOLN
INTRAMUSCULAR | Status: AC
  Filled 2019-01-03: qty 1

## 2019-01-03 MED ORDER — BISACODYL 10 MG RE SUPP: 10.0000 mg | Freq: Every day | RECTAL | Status: DC | PRN

## 2019-01-03 MED ORDER — MUPIROCIN 2 % EX OINT
1.0000 "application " | TOPICAL_OINTMENT | Freq: Two times a day (BID) | CUTANEOUS | Status: DC
  Administered 2019-01-03 – 2019-01-06 (×5): 1 via NASAL
  Filled 2019-01-03: qty 22

## 2019-01-03 MED ORDER — ACETAMINOPHEN 160 MG/5ML PO SOLN: 1000.0000 mg | Freq: Once | ORAL | Status: DC | PRN

## 2019-01-03 MED ORDER — HYDROMORPHONE HCL 1 MG/ML IJ SOLN
0.2500 mg | INTRAMUSCULAR | Status: DC | PRN
  Administered 2019-01-03: 19:00:00 0.25 mg via INTRAVENOUS

## 2019-01-03 MED ORDER — DIPHENHYDRAMINE HCL 12.5 MG/5ML PO ELIX: 12.5000 mg | ORAL_SOLUTION | ORAL | Status: DC | PRN

## 2019-01-03 MED ORDER — LIDOCAINE 2% (20 MG/ML) 5 ML SYRINGE
INTRAMUSCULAR | Status: DC | PRN
  Administered 2019-01-03: 60 mg via INTRAVENOUS

## 2019-01-03 MED ORDER — HYDROCODONE-ACETAMINOPHEN 5-325 MG PO TABS
1.0000 | ORAL_TABLET | ORAL | Status: DC | PRN
  Administered 2019-01-03: 2 via ORAL
  Administered 2019-01-04 – 2019-01-05 (×3): 1 via ORAL
  Administered 2019-01-05 – 2019-01-06 (×2): 2 via ORAL
  Filled 2019-01-03 (×2): qty 2
  Filled 2019-01-03 (×2): qty 1
  Filled 2019-01-03: qty 2
  Filled 2019-01-03: qty 1

## 2019-01-03 MED ORDER — TRAMADOL HCL 50 MG PO TABS
50.0000 mg | ORAL_TABLET | Freq: Four times a day (QID) | ORAL | Status: DC
  Administered 2019-01-04 (×2): 50 mg via ORAL
  Filled 2019-01-03 (×2): qty 1

## 2019-01-03 MED ORDER — ACETAMINOPHEN 325 MG PO TABS: 325.0000 mg | ORAL_TABLET | Freq: Four times a day (QID) | ORAL | Status: DC | PRN

## 2019-01-03 MED ORDER — DEXAMETHASONE SODIUM PHOSPHATE 10 MG/ML IJ SOLN
INTRAMUSCULAR | Status: DC | PRN
  Administered 2019-01-03: 10 mg via INTRAVENOUS

## 2019-01-03 MED ORDER — SUCCINYLCHOLINE CHLORIDE 20 MG/ML IJ SOLN
INTRAMUSCULAR | Status: DC | PRN
  Administered 2019-01-03: 100 mg via INTRAVENOUS

## 2019-01-03 MED ORDER — CHLORHEXIDINE GLUCONATE 4 % EX LIQD: 60.0000 mL | Freq: Once | CUTANEOUS | Status: DC

## 2019-01-03 MED ORDER — FENTANYL CITRATE (PF) 100 MCG/2ML IJ SOLN
50.0000 ug | INTRAMUSCULAR | Status: DC
  Filled 2019-01-03: qty 2

## 2019-01-03 MED ORDER — SODIUM CHLORIDE 0.9 % IV SOLN
INTRAVENOUS | Status: DC
  Administered 2019-01-03 – 2019-01-04 (×3): via INTRAVENOUS

## 2019-01-03 MED ORDER — PHENYLEPHRINE 40 MCG/ML (10ML) SYRINGE FOR IV PUSH (FOR BLOOD PRESSURE SUPPORT)
PREFILLED_SYRINGE | INTRAVENOUS | Status: AC
  Filled 2019-01-03: qty 20

## 2019-01-03 MED ORDER — ONDANSETRON HCL 4 MG PO TABS: 4.0000 mg | ORAL_TABLET | Freq: Four times a day (QID) | ORAL | Status: DC | PRN

## 2019-01-03 MED ORDER — PROPOFOL 10 MG/ML IV BOLUS
INTRAVENOUS | Status: AC
  Filled 2019-01-03: qty 20

## 2019-01-03 MED ORDER — CEFAZOLIN SODIUM-DEXTROSE 2-4 GM/100ML-% IV SOLN
2.0000 g | Freq: Four times a day (QID) | INTRAVENOUS | Status: AC
  Administered 2019-01-03 – 2019-01-04 (×2): 2 g via INTRAVENOUS
  Filled 2019-01-03 (×2): qty 100

## 2019-01-03 MED ORDER — SODIUM CHLORIDE (PF) 0.9 % IJ SOLN
INTRAMUSCULAR | Status: AC
  Filled 2019-01-03: qty 50

## 2019-01-03 MED ORDER — SODIUM CHLORIDE 0.9 % IR SOLN
Status: DC | PRN
  Administered 2019-01-03: 3000 mL
  Administered 2019-01-03: 1000 mL

## 2019-01-03 MED ORDER — ALUM & MAG HYDROXIDE-SIMETH 200-200-20 MG/5ML PO SUSP
15.0000 mL | ORAL | Status: DC | PRN
  Administered 2019-01-04: 15 mL via ORAL
  Filled 2019-01-03: qty 30

## 2019-01-03 MED ORDER — EPHEDRINE 5 MG/ML INJ
INTRAVENOUS | Status: AC
  Filled 2019-01-03: qty 10

## 2019-01-03 MED ORDER — FENTANYL CITRATE (PF) 250 MCG/5ML IJ SOLN
INTRAMUSCULAR | Status: AC
  Filled 2019-01-03: qty 5

## 2019-01-03 MED ORDER — TRANEXAMIC ACID-NACL 1000-0.7 MG/100ML-% IV SOLN
1000.0000 mg | Freq: Once | INTRAVENOUS | Status: AC
  Administered 2019-01-03: 1000 mg via INTRAVENOUS
  Filled 2019-01-03: qty 100

## 2019-01-03 MED ORDER — ONDANSETRON HCL 4 MG/2ML IJ SOLN: 4.0000 mg | Freq: Four times a day (QID) | INTRAMUSCULAR | Status: DC | PRN

## 2019-01-03 MED ORDER — CEFAZOLIN SODIUM-DEXTROSE 2-4 GM/100ML-% IV SOLN
2.0000 g | INTRAVENOUS | Status: AC
  Administered 2019-01-03: 2 g via INTRAVENOUS
  Filled 2019-01-03: qty 100

## 2019-01-03 MED ORDER — HYDROMORPHONE HCL 1 MG/ML IJ SOLN
INTRAMUSCULAR | Status: AC
  Administered 2019-01-03: 0.25 mg via INTRAVENOUS
  Filled 2019-01-03: qty 1

## 2019-01-03 MED ORDER — EPHEDRINE SULFATE-NACL 50-0.9 MG/10ML-% IV SOSY
PREFILLED_SYRINGE | INTRAVENOUS | Status: DC | PRN
  Administered 2019-01-03: 15 mg via INTRAVENOUS
  Administered 2019-01-03: 10 mg via INTRAVENOUS
  Administered 2019-01-03: 15 mg via INTRAVENOUS

## 2019-01-03 MED ORDER — FERROUS SULFATE 325 (65 FE) MG PO TABS
325.0000 mg | ORAL_TABLET | Freq: Two times a day (BID) | ORAL | Status: DC
  Administered 2019-01-04 – 2019-01-06 (×5): 325 mg via ORAL
  Filled 2019-01-03 (×5): qty 1

## 2019-01-03 MED ORDER — DEXAMETHASONE SODIUM PHOSPHATE 10 MG/ML IJ SOLN
10.0000 mg | Freq: Once | INTRAMUSCULAR | Status: AC
  Administered 2019-01-04: 10 mg via INTRAVENOUS
  Filled 2019-01-03: qty 1

## 2019-01-03 MED ORDER — MORPHINE SULFATE (PF) 2 MG/ML IV SOLN
0.5000 mg | INTRAVENOUS | Status: DC | PRN
  Administered 2019-01-06 (×2): 1 mg via INTRAVENOUS
  Filled 2019-01-03 (×2): qty 1

## 2019-01-03 MED ORDER — HYDROCODONE-ACETAMINOPHEN 7.5-325 MG PO TABS: 1.0000 | ORAL_TABLET | ORAL | Status: DC | PRN

## 2019-01-03 MED ORDER — ACETAMINOPHEN 500 MG PO TABS: 1000.0000 mg | ORAL_TABLET | Freq: Once | ORAL | Status: DC | PRN

## 2019-01-03 MED ORDER — ENSURE PRE-SURGERY PO LIQD
296.0000 mL | Freq: Once | ORAL | Status: DC
  Filled 2019-01-03: qty 296

## 2019-01-03 MED ORDER — METOCLOPRAMIDE HCL 5 MG PO TABS: 5.0000 mg | ORAL_TABLET | Freq: Three times a day (TID) | ORAL | Status: DC | PRN

## 2019-01-03 MED ORDER — DOCUSATE SODIUM 100 MG PO CAPS
100.0000 mg | ORAL_CAPSULE | Freq: Two times a day (BID) | ORAL | Status: DC
  Administered 2019-01-03 – 2019-01-05 (×4): 100 mg via ORAL
  Filled 2019-01-03 (×5): qty 1

## 2019-01-03 MED ORDER — POLYETHYLENE GLYCOL 3350 17 G PO PACK
17.0000 g | PACK | Freq: Two times a day (BID) | ORAL | Status: DC
  Administered 2019-01-03 – 2019-01-04 (×2): 17 g via ORAL
  Filled 2019-01-03 (×3): qty 1

## 2019-01-03 MED ORDER — POVIDONE-IODINE 10 % EX SWAB: 2.0000 "application " | Freq: Once | CUTANEOUS | Status: DC

## 2019-01-03 MED ORDER — OXYCODONE HCL 5 MG PO TABS: 5.0000 mg | ORAL_TABLET | Freq: Once | ORAL | Status: DC | PRN

## 2019-01-03 MED ORDER — FENTANYL CITRATE (PF) 100 MCG/2ML IJ SOLN
INTRAMUSCULAR | Status: AC
  Administered 2019-01-03: 50 ug via INTRAVENOUS
  Filled 2019-01-03: qty 2

## 2019-01-03 MED ORDER — METOCLOPRAMIDE HCL 5 MG/ML IJ SOLN: 5.0000 mg | Freq: Three times a day (TID) | INTRAMUSCULAR | Status: DC | PRN

## 2019-01-03 MED ORDER — PHENOL 1.4 % MT LIQD: 1.0000 | OROMUCOSAL | Status: DC | PRN

## 2019-01-03 MED ORDER — POVIDONE-IODINE 10 % EX SWAB
2.0000 "application " | Freq: Once | CUTANEOUS | Status: AC
  Administered 2019-01-03: 2 via TOPICAL

## 2019-01-03 MED ORDER — BUPIVACAINE HCL (PF) 0.25 % IJ SOLN
INTRAMUSCULAR | Status: AC
  Filled 2019-01-03: qty 30

## 2019-01-03 MED ORDER — FENTANYL CITRATE (PF) 100 MCG/2ML IJ SOLN
INTRAMUSCULAR | Status: DC | PRN
  Administered 2019-01-03 (×2): 50 ug via INTRAVENOUS
  Administered 2019-01-03: 100 ug via INTRAVENOUS

## 2019-01-03 MED ORDER — FENTANYL CITRATE (PF) 100 MCG/2ML IJ SOLN
25.0000 ug | INTRAMUSCULAR | Status: DC | PRN
  Administered 2019-01-03 (×2): 50 ug via INTRAVENOUS

## 2019-01-03 MED ORDER — PROPOFOL 10 MG/ML IV BOLUS
INTRAVENOUS | Status: DC | PRN
  Administered 2019-01-03: 120 mg via INTRAVENOUS

## 2019-01-03 MED ORDER — TRANEXAMIC ACID-NACL 1000-0.7 MG/100ML-% IV SOLN
1000.0000 mg | INTRAVENOUS | Status: AC
  Administered 2019-01-03: 1000 mg via INTRAVENOUS
  Filled 2019-01-03: qty 100

## 2019-01-03 MED ORDER — MENTHOL 3 MG MT LOZG: 1.0000 | LOZENGE | OROMUCOSAL | Status: DC | PRN

## 2019-01-03 MED ORDER — ACETAMINOPHEN 10 MG/ML IV SOLN: 1000.0000 mg | Freq: Once | INTRAVENOUS | Status: DC | PRN

## 2019-01-03 MED ORDER — OXYCODONE HCL 5 MG/5ML PO SOLN: 5.0000 mg | Freq: Once | ORAL | Status: DC | PRN

## 2019-01-03 MED ORDER — MIDAZOLAM HCL 2 MG/2ML IJ SOLN
1.0000 mg | INTRAMUSCULAR | Status: DC
  Filled 2019-01-03: qty 2

## 2019-01-03 MED ORDER — PHENYLEPHRINE 40 MCG/ML (10ML) SYRINGE FOR IV PUSH (FOR BLOOD PRESSURE SUPPORT)
PREFILLED_SYRINGE | INTRAVENOUS | Status: DC | PRN
  Administered 2019-01-03 (×3): 120 ug via INTRAVENOUS

## 2019-01-03 SURGICAL SUPPLY — 71 items
BAG ZIPLOCK 12X15 (MISCELLANEOUS) ×2 IMPLANT
BANDAGE ESMARK 6X9 LF (GAUZE/BANDAGES/DRESSINGS) ×1 IMPLANT
BLADE SAW SGTL 11.0X1.19X90.0M (BLADE) ×1 IMPLANT
BLADE SAW SGTL 13.0X1.19X90.0M (BLADE) ×2 IMPLANT
BLADE SAW SGTL 81X20 HD (BLADE) ×2 IMPLANT
BNDG ELASTIC 6X5.8 VLCR STR LF (GAUZE/BANDAGES/DRESSINGS) ×2 IMPLANT
BNDG ESMARK 6X9 LF (GAUZE/BANDAGES/DRESSINGS) ×2
BOWL SMART MIX CTS (DISPOSABLE) IMPLANT
CABLE CERLAGE W/CRIMP 1.8 (Cable) ×3 IMPLANT
COVER SURGICAL LIGHT HANDLE (MISCELLANEOUS) ×2 IMPLANT
COVER WAND RF STERILE (DRAPES) IMPLANT
CUFF TOURN SGL QUICK 34 (TOURNIQUET CUFF) ×1
CUFF TRNQT CYL 34X4.125X (TOURNIQUET CUFF) ×1 IMPLANT
DRAPE POUCH INSTRU U-SHP 10X18 (DRAPES) ×2 IMPLANT
DRAPE SHEET LG 3/4 BI-LAMINATE (DRAPES) ×2 IMPLANT
DRAPE U-SHAPE 47X51 STRL (DRAPES) ×2 IMPLANT
DRESSING AQUACEL AG SP 3.5X10 (GAUZE/BANDAGES/DRESSINGS) IMPLANT
DRSG ADAPTIC 3X8 NADH LF (GAUZE/BANDAGES/DRESSINGS) ×2 IMPLANT
DRSG AQUACEL AG SP 3.5X10 (GAUZE/BANDAGES/DRESSINGS)
DRSG PAD ABDOMINAL 8X10 ST (GAUZE/BANDAGES/DRESSINGS) ×3 IMPLANT
DRSG TEGADERM 4X4.75 (GAUZE/BANDAGES/DRESSINGS) ×1 IMPLANT
DURAPREP 26ML APPLICATOR (WOUND CARE) ×3 IMPLANT
ELECT REM PT RETURN 15FT ADLT (MISCELLANEOUS) ×2 IMPLANT
EVACUATOR 1/8 PVC DRAIN (DRAIN) ×1 IMPLANT
FACESHIELD WRAPAROUND (MASK) ×10 IMPLANT
FACESHIELD WRAPAROUND OR TEAM (MASK) ×5 IMPLANT
FLEXIBLE OSTEOTOME ×3 IMPLANT
GAUZE SPONGE 2X2 8PLY STRL LF (GAUZE/BANDAGES/DRESSINGS) IMPLANT
GAUZE SPONGE 4X4 12PLY STRL (GAUZE/BANDAGES/DRESSINGS) ×2 IMPLANT
GAUZE XEROFORM 5X9 LF (GAUZE/BANDAGES/DRESSINGS) ×1 IMPLANT
GLOVE BIOGEL M 7.0 STRL (GLOVE) IMPLANT
GLOVE BIOGEL PI IND STRL 7.5 (GLOVE) ×1 IMPLANT
GLOVE BIOGEL PI IND STRL 8.5 (GLOVE) ×1 IMPLANT
GLOVE BIOGEL PI INDICATOR 7.5 (GLOVE) ×1
GLOVE BIOGEL PI INDICATOR 8.5 (GLOVE) ×1
GLOVE ECLIPSE 8.0 STRL XLNG CF (GLOVE) ×2 IMPLANT
GLOVE ORTHO TXT STRL SZ7.5 (GLOVE) ×4 IMPLANT
GOWN STRL REUS W/TWL 2XL LVL3 (GOWN DISPOSABLE) ×2 IMPLANT
GOWN STRL REUS W/TWL LRG LVL3 (GOWN DISPOSABLE) ×2 IMPLANT
GOWN STRL REUS W/TWL XL LVL3 (GOWN DISPOSABLE) ×4 IMPLANT
HANDPIECE INTERPULSE COAX TIP (DISPOSABLE) ×1
HOLDER FOLEY CATH W/STRAP (MISCELLANEOUS) ×1 IMPLANT
IMMOBILIZER KNEE 20 (SOFTGOODS) ×2 IMPLANT
IMMOBILIZER KNEE 20 THIGH 36 (SOFTGOODS) IMPLANT
KIT TURNOVER KIT A (KITS) IMPLANT
MANIFOLD NEPTUNE II (INSTRUMENTS) ×2 IMPLANT
NDL SAFETY ECLIPSE 18X1.5 (NEEDLE) IMPLANT
NEEDLE HYPO 18GX1.5 SHARP (NEEDLE) ×1
NS IRRIG 1000ML POUR BTL (IV SOLUTION) ×2 IMPLANT
OSTEOTOME THIN 10.0 6 (INSTRUMENTS) ×1 IMPLANT
OSTEOTOME U 10.5X8 (ORTHOPEDIC DISPOSABLE SUPPLIES) ×1 IMPLANT
OSTEOTOME U 12.0 8 (INSTRUMENTS) ×1 IMPLANT
PACK TOTAL KNEE CUSTOM (KITS) ×2 IMPLANT
PADDING CAST COTTON 6X4 STRL (CAST SUPPLIES) ×4 IMPLANT
PROTECTOR NERVE ULNAR (MISCELLANEOUS) ×2 IMPLANT
SET HNDPC FAN SPRY TIP SCT (DISPOSABLE) ×1 IMPLANT
SET PAD KNEE POSITIONER (MISCELLANEOUS) ×2 IMPLANT
SPONGE GAUZE 2X2 STER 10/PKG (GAUZE/BANDAGES/DRESSINGS) ×1
STAPLER VISISTAT 35W (STAPLE) ×2 IMPLANT
SUCTION FRAZIER HANDLE 12FR (TUBING) ×1
SUCTION TUBE FRAZIER 12FR DISP (TUBING) ×1 IMPLANT
SUT VIC AB 1 CT1 36 (SUTURE) ×6 IMPLANT
SUT VIC AB 2-0 CT1 27 (SUTURE) ×4
SUT VIC AB 2-0 CT1 TAPERPNT 27 (SUTURE) ×3 IMPLANT
SYR 3ML LL SCALE MARK (SYRINGE) ×1 IMPLANT
TOWEL OR 17X26 10 PK STRL BLUE (TOWEL DISPOSABLE) ×4 IMPLANT
TRAY FOLEY MTR SLVR 14FR STAT (SET/KITS/TRAYS/PACK) ×1 IMPLANT
TRAY FOLEY MTR SLVR 16FR STAT (SET/KITS/TRAYS/PACK) ×1 IMPLANT
WATER STERILE IRR 1000ML POUR (IV SOLUTION) ×3 IMPLANT
WRAP KNEE MAXI GEL POST OP (GAUZE/BANDAGES/DRESSINGS) ×2 IMPLANT
YANKAUER SUCT BULB TIP 10FT TU (MISCELLANEOUS) IMPLANT

## 2019-01-03 NOTE — Interval H&P Note (Signed)
History and Physical Interval Note:  01/03/2019 2:28 PM  [place]  has presented today for surgery, with the diagnosis of FAILED RIGHT KNEE.  The various methods of treatment have been discussed with the patient and family. After consideration of risks, benefits and other options for treatment, the patient has consented to  Procedure(s): EXCISIONAL TOTAL KNEE ARTHROPLASTY (Right) as a surgical intervention.  The patient's history has been reviewed, patient examined, no change in status, stable for surgery.  I have reviewed the patient's chart and labs.  Questions were answered to the patient's satisfaction.     [name]

## 2019-01-03 NOTE — H&P (View-Only) (Signed)
Patient ID: Brittany Russo, female   DOB: 1945/12/22, 73 y.o.   MRN: [id number] Subjective: Day of Surgery Procedure(s) (LRB): EXCISIONAL TOTAL KNEE ARTHROPLASTY (Right)    Patient reports pain as mild.  Objective:   VITALS:   Vitals:   01/02/19 2121 01/03/19 1311  BP: (!) 155/69 (!) 151/62  Pulse: 68 63  Resp: 16 15  Temp: 98.2 F (36.8 C) 98.5 F (36.9 C)  SpO2: 100% 97%    Neurovascular intact  LABS Recent Labs    01/01/19 0420 01/02/19 0449 01/03/19 0454  HGB 8.7* 9.3* 10.1*  HCT 26.7* 29.0* 31.9*  WBC 2.8* 2.8* 3.1*  PLT 38* 39* 76*    Recent Labs    01/01/19 0420 01/02/19 0449 01/03/19 0454  NA 132* 135 133*  K 3.6 4.3 4.3  BUN 9 10 8   CREATININE 0.85 0.68 0.61  GLUCOSE 221* 232* 164*    No results for input(s): LABPT, INR in the last 72 hours.   Assessment/Plan: Day of Surgery Procedure(s) (LRB): EXCISIONAL TOTAL KNEE ARTHROPLASTY (Right)  NPO To OR today to resect her knee Plans reviewed with family Post-op plans depending on intra-op findings during procedure

## 2019-01-03 NOTE — Transfer of Care (Signed)
Immediate Anesthesia Transfer of Care Note  Patient: Brittany Russo  Procedure(s) Performed: EXCISIONAL TOTAL KNEE ARTHROPLASTY (Right Knee)  Patient Location: PACU  Anesthesia Type:General  Level of Consciousness: awake, alert , oriented and patient cooperative  Airway & Oxygen Therapy: Patient Spontanous Breathing and Patient connected to face mask oxygen  Post-op Assessment: Report given to RN and Post -op Vital signs reviewed and stable  Post vital signs: Reviewed and stable  Last Vitals:  Vitals Value Taken Time  BP 106/52 01/03/19 1837  Temp    Pulse 74 01/03/19 1840  Resp 25 01/03/19 1840  SpO2 100 % 01/03/19 1840  Vitals shown include unvalidated device data.  Last Pain:  Vitals:   01/03/19 1335  TempSrc:   PainSc: 0-No pain      Patients Stated Pain Goal: 4 ([id number])  Complications: No apparent anesthesia complications

## 2019-01-03 NOTE — Anesthesia Preprocedure Evaluation (Addendum)
Anesthesia Evaluation  Patient identified by MRN, date of birth, ID band Patient awake    Reviewed: Allergy & Precautions, NPO status , Patient's Chart, lab work & pertinent test results  History of Anesthesia Complications Negative for: history of anesthetic complications  Airway Mallampati: II  TM Distance: >3 FB Neck ROM: Full    Dental  (+) Teeth Intact, Dental Advisory Given,    Pulmonary neg recent URI,    breath sounds clear to auscultation       Cardiovascular hypertension, Pt. on medications  Rhythm:Regular     Neuro/Psych PSYCHIATRIC DISORDERS Anxiety Depression    GI/Hepatic (+) Cirrhosis   ascites    ,   Endo/Other  diabetes, Insulin Dependent, Oral Hypoglycemic AgentsMorbid obesityHyponatremia   Renal/GU Renal disease     Musculoskeletal  (+) Arthritis , FAILED RIGHT KNEE   Abdominal   Peds  Hematology  (+) Blood dyscrasia, anemia , Thrombocytopenia    Anesthesia Other Findings   Reproductive/Obstetrics                            Anesthesia Physical Anesthesia Plan  ASA: III  Anesthesia Plan: General and Regional   Post-op Pain Management:  Regional for Post-op pain   Induction: Intravenous  PONV Risk Score and Plan: 3 and Ondansetron and Dexamethasone  Airway Management Planned: Oral ETT  Additional Equipment: None  Intra-op Plan:   Post-operative Plan: Extubation in OR  Informed Consent: I have reviewed the patients History and Physical, chart, labs and discussed the procedure including the risks, benefits and alternatives for the proposed anesthesia with the patient or authorized representative who has indicated his/her understanding and acceptance.     Dental advisory given  Plan Discussed with: CRNA and Surgeon  Anesthesia Plan Comments:         Anesthesia Quick Evaluation

## 2019-01-03 NOTE — Progress Notes (Signed)
PROGRESS NOTE    Brittany Russo  DPO:[id number] DOB: [birth year] DOA: 12/28/2018 PCP: Rosalee Kaufman, PA-C   Brief Narrative:  HPI per Dr. Shela Leff on 12/28/2018 Brittany Russo is a 73 y.o. female with medical history significant of liver cirrhosis secondary to NAFLD, insulin-dependent diabetes mellitus, depression, anxiety, hypertension, hyperlipidemia, seizure disorder, recent right knee extensor mechanism repair on 11/22/2018 presenting to the hospital from her SNF for evaluation of AMS.  Patient is confused and slow to respond to questions.  She is not sure why she is here.  Oriented to self and knows it is October 2020.  Not oriented to place.  Denies fevers, chest pain, shortness of breath, cough, nausea, vomiting, abdominal pain, or dysuria.  No additional history could be obtained from her.  ED Course: Bradycardic with heart rate in the 40s.  Not hypotensive.  Afebrile and no leukocytosis.  Lactic acid 2.5.  Ammonia level 63, elevated compared to prior labs.  Hemoglobin 11.8, above baseline.  Platelet count 116,000, has chronic thrombocytopenia and platelet count is currently improved from baseline.  Sodium 131, chronically low and at baseline.  Potassium 5.5.  Bicarb 19, anion gap 10.  Blood glucose 188.  BUN 34, creatinine 1.5.  Creatinine was 0.7 on 9/13.  T bili 1.6, remainder of LFTs normal.  INR 1.2.  UA with large amount of leukocytes, greater than 50 RBCs, greater than 50 WBCs, and many bacteria on microscopic examination.  Urine culture pending.  VBG with pH 7.34.  Head CT negative for acute finding. Patient received ceftriaxone, lactulose, and 1 L normal saline bolus.  **Interim History  Patient started having some hematuria and some melena so GI and urology were consulted.  Encephalopathy appears to be improving.  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  Dr.  Brien Mates recommended a soft diet and recommended mesalamine suppositories.  Urology feels that the patient stable for outpatient cystoscopy and recommended no inpatient intervention at this time.  Orthopedics evaluated the patient's case and are planning to take out the patient's knee component tomorrow (Tuesday) because they are worried about her skin breaking down over her femoral component.  Nutritionist was consulted as well for poor p.o. intake  Assessment & Plan:   Principal Problem:   Hepatic encephalopathy ([place]) Active Problems:   AKI (acute kidney injury) ([place])   UTI (urinary tract infection)   Bradycardia   Hyperkalemia  Acute Hepatic Encephalopathy, improving  Decompensated liver cirrhosis without varices -History of liver cirrhosis secondary to NAFLD -Patient was confused and slow to respond to questions and on admission was Oriented to self and knows it is October 2020.  Not oriented to place and does not know why she is here.;  Mentation is much improved  -Ammonia Level is 34 now -No lactulose listed in home medications.  Only rifaximin is listed.   -Stroke less likely as head CT negative and neuro exam nonfocal.   -Does have a UTI with hematuria which is also likely contributing to the patient's encephalopathy.   -In addition, polypharmacy could also be contributing as Xanax, Norco, and Robaxin listed in home medications. -Lactulose started at 20 g po TIDprn to maintain 2-3 BM's daily changed to 20 mg p.o. twice daily scheduled for now; GI recommending Continuing Plan of Care and recommending a Bed Pan -Continue Rifaximin 550 mg po BID  -AST is now 53 and ALT is  now 85 -Has had a Hx of Ascites with 850 mL Fluid Removed -Treatment of UTI as mentioned below -Hold sedating medications -Continue to monitor mental status carefully   Citrobacter Braakii and Enterococcus Raffinosus UTI with Hematuria, poA -Afebrile no leukocytosis but has Leukopenia .   -Lactic acid mildly  elevated, dehydration could be contributing.   -UA with large amount of leukocytes, greater than 50 RBCs, greater than 50 WBCs, and many bacteria on microscopic examination. -Continue IV ceftriaxone and added IV Vancomycin for Enterococcus Raffinosus  -Urine culture pending along with Blood Cx; Urine Cx grew 20,000 CFU of Citrobacter Braakii sensitive to Ceftriaxone and >100,000 CFU of Enterococcus Raffinosus that was Resistant to most Abx but Sensitive to IV Vancomycin  -Urology consulted for further evaluation given her Hematuria and Dr. Alyson Ingles evaluated yesterday and he requested the urine is sent for cystology but likely meant cytology.  He recommends that she be scheduled for an outpatient cystoscopy because first hemoglobin is currently stable and she is emptying well we do not feel like any further intervention at this point was necessary for her gross hematuria -Family consistently pushing for a cystoscopy by urology still saying that this needs to be done in outpatient setting and will defer to them -C/w IV Ceftriaxone and IV Vancomycin for now   Melena with Clots, stable now   -Dr. Watt Climes was consulted for further evaluation recommendations and feels that this is likely secondary to radiation proctitis and he has discussed the risks and benefits and methods of endoscopy and flex sig  -EGD done and showed "Normal larynx. Small hiatal hernia. A single gastric polyp. Atrophic gastritis. Normal duodenal bulb, first portion of the duodenum, second portion of the duodenum and third portion of the duodenum." -Flex Sig done showed stool in the mid sigmoid colon with no blood seen coming from above.  There is also mucosal ulceration that was treated with radiofrequency ablation and that was palpable with radiation damage and the examination was otherwise normal -Continue to monitor for signs and symptoms of bleeding; continue to monitor hemoglobin and hematocrit and trend -Dr. Driscilla Grammes recommended a  soft diet as well as using MiraLAX twice a day to keep stools soft and recommending Canasa 1000 mg suppository per rectum nightly indefinitely -Hemoglobin/Hematocrit is now 10.1/31.9 -Appreciate further GI evaluation and they have no new recommendations  AKI, improving  -BUN 34, creatinine 1.57 on Admission.   -Creatinine was 0.7 on 9/13.  Likely prerenal secondary to dehydration and home diuretic use. -IVF now stopped -BUN/Cr is now 8/0.61 -Continue to monitor renal function -Monitor urine output -Hold diuretics for now -Repeat CMP in AM   Bradycardia, PR prolongation -Bradycardic with heart rate in the 40s.  Not hypotensive.  -Atenolol and Cardizem listed in home medications. -Continue Cardiac monitoring; HR was 95 this AM so will resume Atenolol -Hold Cardizem for now  -Checked TSH and was 3.562 and free T4 was 1.18  Mild lactic acidosis -Lactic acid 2.5.  Suspect related to dehydration.  Does have a UTI but no fever or leukocytosis. -Received 1 L fluid bolus in the ED.  Continued IV fluid hydration with normal saline at a rate of 75 mL's per hour but then changed to sodium bicarbonate with 150 mEq of bicarbonate and dextrose 5% at 75 mL's per hour but IVF is now stopped  -Continue to trend lactate  Mild hyperkalemia, improving  -Potassium 5.5 on admission.  Likely related to AKI and home medications (spironolactone and potassium supplement). -Cardiac monitoring -  Hold spironolactone and potassium supplement -K+ is now 4.3 -Continue to monitor potassium level  Normal anion gap metabolic acidosis -Bicarb 19, anion gap 10 on Admission and now CO2 is now 24 and AG is now 8 with Chloride Level of 101.   -Likely related to home diuretic use and AKI. -Hold diuretic for now and continue to Monitor off of Fluid today  -Continue to monitor and repeat CMP in AM   Hyperbilirubinemia -T bili was 1.6 on Admission.   -Does have a history of cirrhosis but transaminases are normal.  -Now T bili is 1.2 -Continue to Monitor and Trend -Repeat CMP in AM   Insulin-dependent diabetes mellitus -Checked A1c level and was 6.3.   -Sliding scale insulin sensitive and CBG checks. -CBG ranging from 143-290 -Diabetes Education Coordinator Consulted and recommending Lantus 8 units qHS and starting Novolog Meal Coverage with 4 units TID with meals  Recent knee surgery with Failed Knee Replacement  -Patient underwent right knee extensor mechanism repair on 11/22/2018.  Has a knee brace in place. -PT evaluation recommending SNF -Orthopedics evaluated and recommending removing her knee components but Dr. Alvan Dame requests that her Platelets be >80,000 so she was transfused 2 Pools of Platelets yesterday and now Platelet count is 76,000 -Patient likely to have knee surgery this admission and is to be done today   Hyponatremia -Now Na+ is 133 -IVF Hydration now stopped  -Continue to Monitor and Trend -Repeat CMP in AM   Thrombocytopenia -In the setting of liver cirrhosis and likely splenic sequestration but worsened in the setting of bleeding and hematuria -The patient platelet count went from 116,000 now 39,000 yesterday; S/p 2 Pools of Platelets and is now 76,0000 -In the setting of Bleeding and Infection -Typed and screened and transfuse 2 Pools of Platelets in anticipation for Surgery and if necessary can get another Pool of Platelets prior to Surgery  -Continue to monitor for signs and symptoms of bleeding and she is having some melena and some hematuria -Repeat CBC  Pancytopenia -From Liver Cirrhosis and likely dilutional drop?; Slightly worsening  -WBC is now 2.8, Hgb/HCt is now 9.3/29.0, and Platelet Count is now 39,000 -Will be getting 2 Pools of Platelets today given Orthopedic Request -Continue to Monitor and Trend  -Repeat CBC in AM   Obesity -Estimated body mass index is 31.65 kg/m as calculated from the following:   Height as of this encounter: 5' 2"  (1.575 m).    Weight as of this encounter: 78.5 kg. -Weight Loss and Dietary Counseling given   Hypomagnesemia -Patient's magnesium level this morning was 1.9 -Continue to monitor replete as necessary -Repeat magnesium level in a.m.  DVT prophylaxis: SCDs Code Status: FULL CODE Family Communication: No family present at bedside Disposition Plan: Pending further workup and improvement back to baseline; Needs SNF  Consultants:   Gastroenterology   Urology   Orthopedic Surgery    Procedures:  EGD Findings:      The larynx was normal.      A small hiatal hernia was present.      A single diminutive semi-sessile polyp was found in the cardia.      Diffuse minimal inflammation characterized by congestion (edema) was       found in the gastric body.      The duodenal bulb, first portion of the duodenum, second portion of the       duodenum and third portion of the duodenum were normal.      The exam was  otherwise without abnormality. Impression:               - Normal larynx.                           - Small hiatal hernia.                           - A single gastric polyp.                           - Atrophic gastritis.                           - Normal duodenal bulb, first portion of the                            duodenum, second portion of the duodenum and third                            portion of the duodenum.                           - The examination was otherwise normal. And                            specifically no signs of esophageal or gastric                            varices and no signs of portal gastropathy                           - No specimens collected.  Flex Sigmoidoscopy Findings:      A large amount of solid stool was found in the mid sigmoid colon,       interfering with visualization.      Oozing ulcerated mucosa with stigmata of recent bleeding were present in       the distal rectum. She also had some other areas of probable radiation       proctitis  and we proceeded with focal radiofrequency ablation of her       radiation damage which was performed. With the endoscope in place,       Endoscopic visualization identified an ablation site. The radiofrequency       channel ablation catheter was introduced through the endoscope working       channel. . The endoscope with the ablation catheter was advanced to the       areas radiation mucosa. The endoscope with the channel ablation catheter       was positioned under direct visualization so that the catheter was       placed in contact with the surface of the radiation mucosa. Energy was       applied x11 bursts only. The ablation catheter was removed through the       endoscope working channel. The areas of the rectum mucosa had been       ablated were examined and no further oozing was seen.      The exam was otherwise without abnormality. Impression:               -  Stool in the mid sigmoid colon. No blood seen                            coming from above                           - Mucosal ulceration. Treated with radiofrequency                            ablation. Patible with radiation damage                           - The examination was otherwise normal.                           - No specimens collected.  Antimicrobials:  Anti-infectives (From admission, onward)   Start     Dose/Rate Route Frequency Ordered Stop   01/04/19 0600  ceFAZolin (ANCEF) IVPB 2g/100 mL premix     2 g 200 mL/hr over 30 Minutes Intravenous On call to O.R. 01/03/19 1313 01/05/19 0559   01/02/19 1000  [MAR Hold]  vancomycin (VANCOCIN) IVPB 1000 mg/200 mL premix     (MAR Hold since Tue 01/03/2019 at 1305.Hold Reason: Transfer to a Procedural area.)   1,000 mg 200 mL/hr over 60 Minutes Intravenous Every 24 hours 01/01/19 1012     01/01/19 0845  vancomycin (VANCOCIN) 1,500 mg in sodium chloride 0.9 % 500 mL IVPB     1,500 mg 250 mL/hr over 120 Minutes Intravenous  Once 01/01/19 0833 01/01/19 1204   12/29/18  2200  [MAR Hold]  cefTRIAXone (ROCEPHIN) 1 g in sodium chloride 0.9 % 100 mL IVPB     (MAR Hold since Tue 01/03/2019 at 1305.Hold Reason: Transfer to a Procedural area.)   1 g 200 mL/hr over 30 Minutes Intravenous Every 24 hours 12/28/18 2240     12/29/18 1000  [MAR Hold]  rifaximin (XIFAXAN) tablet 550 mg     (MAR Hold since Tue 01/03/2019 at 1305.Hold Reason: Transfer to a Procedural area.)   550 mg Oral 2 times daily 12/28/18 2240     12/28/18 2200  cefTRIAXone (ROCEPHIN) 1 g in sodium chloride 0.9 % 100 mL IVPB     1 g 200 mL/hr over 30 Minutes Intravenous  Once 12/28/18 2153 12/28/18 2257     Subjective: Patient examined at bedside this AM and had no complaints.  Still having some hematuria.  No chest pain, answer dizziness.  No nausea or vomiting and she is very lucid with a clear mental status today.  No other concerns or complaints at this time is awaiting surgical intervention.  Objective: Vitals:   01/02/19 1807 01/02/19 1849 01/02/19 2121 01/03/19 1311  BP: (!) 153/65 138/66 (!) 155/69 (!) 151/62  Pulse: 70 72 68 63  Resp: 16 12 16 15   Temp: 98.5 F (36.9 C) 98.6 F (37 C) 98.2 F (36.8 C) 98.5 F (36.9 C)  TempSrc: Oral Oral Oral Oral  SpO2: 100% 100% 100% 97%  Weight:      Height:        Intake/Output Summary (Last 24 hours) at 01/03/2019 1316 Last data filed at 01/03/2019 1119 Gross per 24 hour  Intake 1505.56 ml  Output 1950 ml  Net -444.44 ml   [employer]  12/29/18 0039 12/30/18 0659  Weight: 78.5 kg 78.5 kg   Examination: Physical Exam:  Constitutional: WN/WD obese Caucasian female in NAD and appears calm and comfortable Eyes:  Lids and conjunctivae normal, sclerae anicteric  ENMT: External Ears, Nose appear normal. Grossly normal hearing. Mucous membranes are moist. Neck: Appears normal, supple, no cervical masses, normal ROM, no appreciable thyromegaly Respiratory: Diminished to auscultation bilaterally, no wheezing, rales, rhonchi or crackles.  Normal respiratory effort and patient is not tachypenic. No accessory muscle use. Unlabored breathing  Cardiovascular: RRR, no murmurs / rubs / gallops. S1 and S2 auscultated. Trace extremity edema Abdomen: Soft, non-tender, Distended due to body habitus. Bowel sounds positive x4.  GU: Deferred. Musculoskeletal: Right Leg remains immobilized and has bilateral ankle contractures Skin: No rashes, lesions, ulcers on a limited skin evaluation. No induration; Warm and dry.  Neurologic: CN 2-12 grossly intact with no focal deficits. Romberg sign and cerebellar reflexes not assessed.  Psychiatric: Normal judgment and insight. Alert and oriented x 3. Pleasant mood and appropriate affect.   Data Reviewed: I have personally reviewed following labs and imaging studies  CBC: Recent Labs  Lab 12/30/18 0412 12/31/18 0520 01/01/19 0420 01/02/19 0449 01/03/19 0454  WBC 2.6* 3.0* 2.8* 2.8* 3.1*  NEUTROABS 1.4* 1.9 1.6* 1.6* 1.6*  HGB 10.0* 9.7* 8.7* 9.3* 10.1*  HCT 31.4* 30.1* 26.7* 29.0* 31.9*  MCV 95.7 94.4 95.0 96.7 97.0  PLT 47* 45* 38* 39* 76*   Basic Metabolic Panel: Recent Labs  Lab 12/30/18 0412 12/31/18 0520 01/01/19 0420 01/02/19 0449 01/03/19 0454  NA 134* 130* 132* 135 133*  K 4.7 3.9 3.6 4.3 4.3  CL 105 99 98 102 101  CO2 17* 21* 25 24 24   GLUCOSE 74 209* 221* 232* 164*  BUN 18 15 9 10 8   CREATININE 1.01* 1.04* 0.85 0.68 0.61  CALCIUM 8.9 8.6* 8.3* 8.7* 8.8*  MG 1.7 1.6* 1.9 1.8 1.9  PHOS 3.5 4.2 4.0 3.9 3.7   GFR: Estimated Creatinine Clearance: 63.5 mL/min (by C-G formula based on SCr of 0.61 mg/dL). Liver Function Tests: Recent Labs  Lab 12/30/18 0412 12/31/18 0520 01/01/19 0420 01/02/19 0449 01/03/19 0454  AST 39 36 34 40 53*  ALT 27 31 29  34 44  ALKPHOS 84 88 78 85 105  BILITOT 0.9 1.2 1.0 1.1 1.2  PROT 5.4* 5.4* 4.9* 5.1* 5.7*  ALBUMIN 2.9* 2.9* 2.6* 2.7* 2.9*   No results for input(s): LIPASE, AMYLASE in the last 168 hours. Recent Labs  Lab 12/28/18  1813 12/29/18 0413 12/31/18 0520 01/01/19 0420 01/03/19 0454  AMMONIA 63* 57* 61* 38* 34   Coagulation Profile: Recent Labs  Lab 12/28/18 1812  INR 1.2   Cardiac Enzymes: No results for input(s): CKTOTAL, CKMB, CKMBINDEX, TROPONINI in the last 168 hours. BNP (last 3 results) No results for input(s): PROBNP in the last 8760 hours. HbA1C: No results for input(s): HGBA1C in the last 72 hours. CBG: Recent Labs  Lab 01/02/19 1805 01/02/19 2044 01/03/19 0722 01/03/19 1105 01/03/19 1314  GLUCAP 338* 290* 145* 143* 144*   Lipid Profile: No results for input(s): CHOL, HDL, LDLCALC, TRIG, CHOLHDL, LDLDIRECT in the last 72 hours. Thyroid Function Tests: No results for input(s): TSH, T4TOTAL, FREET4, T3FREE, THYROIDAB in the last 72 hours. Anemia Panel: No results for input(s): VITAMINB12, FOLATE, FERRITIN, TIBC, IRON, RETICCTPCT in the last 72 hours. Sepsis Labs: Recent Labs  Lab 12/28/18 1813 12/28/18 2240  LATICACIDVEN 2.5* 1.8    Recent Results (from the past  240 hour(s))  Urine culture     Status: Abnormal   Collection Time: 12/28/18  5:43 PM   Specimen: Urine, Random  Result Value Ref Range Status   Specimen Description   Final    URINE, RANDOM Performed at [facility] [address], [place], [place] [address]    Special Requests   Final    NONE Performed at [facility], [place] [address], [place], [place] [address]    Culture (A)  Final    20,000 COLONIES/mL CITROBACTER BRAAKII >=100,000 COLONIES/mL ENTEROCOCCUS RAFFINOSUS    Report Status 12/31/2018 FINAL  Final   Organism ID, Bacteria CITROBACTER BRAAKII (A)  Final   Organism ID, Bacteria ENTEROCOCCUS RAFFINOSUS (A)  Final      Susceptibility   Citrobacter braakii - MIC*    CEFAZOLIN >=64 RESISTANT Resistant     CEFTRIAXONE <=1 SENSITIVE Sensitive     CIPROFLOXACIN <=0.25 SENSITIVE Sensitive     GENTAMICIN <=1 SENSITIVE Sensitive     IMIPENEM 0.5 SENSITIVE Sensitive      NITROFURANTOIN 256 RESISTANT Resistant     TRIMETH/SULFA <=20 SENSITIVE Sensitive     PIP/TAZO <=4 SENSITIVE Sensitive     * 20,000 COLONIES/mL CITROBACTER BRAAKII   Enterococcus raffinosus - MIC*    AMPICILLIN 16 RESISTANT Resistant     LEVOFLOXACIN 4 INTERMEDIATE Intermediate     NITROFURANTOIN 32 SENSITIVE Sensitive     VANCOMYCIN <=0.5 SENSITIVE Sensitive     * >=100,000 COLONIES/mL ENTEROCOCCUS RAFFINOSUS  SARS CORONAVIRUS 2 (TAT 6-24 HRS) Nasopharyngeal Nasopharyngeal Swab     Status: None   Collection Time: 12/28/18 10:53 PM   Specimen: Nasopharyngeal Swab  Result Value Ref Range Status   SARS Coronavirus 2 NEGATIVE NEGATIVE Final    Comment: (NOTE) SARS-CoV-2 target nucleic acids are NOT DETECTED. The SARS-CoV-2 RNA is generally detectable in upper and lower respiratory specimens during the acute phase of infection. Negative results do not preclude SARS-CoV-2 infection, do not rule out co-infections with other pathogens, and should not be used as the sole basis for treatment or other patient management decisions. Negative results must be combined with clinical observations, patient history, and epidemiological information. The expected result is Negative. Fact Sheet for Patients: SugarRoll.be Fact Sheet for Healthcare Providers: https://www.woods-mathews.com/ This test is not yet approved or cleared by the [place] FDA and  has been authorized for detection and/or diagnosis of SARS-CoV-2 by FDA under an Emergency Use Authorization (EUA). This EUA will remain  in effect (meaning this test can be used) for the duration of the COVID-19 declaration under Section 56 4(b)(1) of the Act, 21 U.S.C. section 360bbb-3(b)(1), unless the authorization is terminated or revoked sooner. Performed at [facility], [place] [address], [place], [place] [address]   MRSA PCR Screening     Status: None   Collection Time: 12/29/18  7:41 AM    Specimen: Nasal Mucosa; Nasopharyngeal  Result Value Ref Range Status   MRSA by PCR NEGATIVE NEGATIVE Final    Comment:        The GeneXpert MRSA Assay (FDA approved for NASAL specimens only), is one component of a comprehensive MRSA colonization surveillance program. It is not intended to diagnose MRSA infection nor to guide or monitor treatment for MRSA infections. Performed at [facility], [place] [address] [place], [place] [address]   Culture, blood (routine x 2)     Status: None (Preliminary result)   Collection Time: 12/29/18  9:08 AM  Specimen: BLOOD RIGHT HAND  Result Value Ref Range Status   Specimen Description   Final    BLOOD RIGHT HAND Performed at [facility] [address], [place], [place] [address]    Special Requests   Final    BOTTLES DRAWN AEROBIC ONLY Blood Culture adequate volume Performed at [facility] [address] [place], [place] [address]    Culture   Final    NO GROWTH 4 DAYS Performed at [facility], [place] [address] [place], [place] [address]    Report Status PENDING  Incomplete  Culture, blood (routine x 2)     Status: None (Preliminary result)   Collection Time: 12/29/18  9:08 AM   Specimen: BLOOD LEFT HAND  Result Value Ref Range Status   Specimen Description   Final    BLOOD LEFT HAND Performed at [facility] [address], [place], [place] [address]    Special Requests   Final    BOTTLES DRAWN AEROBIC ONLY Blood Culture results may not be optimal due to an inadequate volume of blood received in culture bottles Performed at [facility] [address] [place], [place] [address]    Culture   Final    NO GROWTH 4 DAYS Performed at [facility], [place] [address], [place], [place] [address]    Report Status PENDING  Incomplete  Surgical PCR screen     Status: None   Collection Time: 01/03/19 12:30 AM   Specimen: Nasal  Mucosa; Nasal Swab  Result Value Ref Range Status   MRSA, PCR NEGATIVE NEGATIVE Final   Staphylococcus aureus NEGATIVE NEGATIVE Final    Comment: (NOTE) The Xpert SA Assay (FDA approved for NASAL specimens in patients 52 years of age and older), is one component of a comprehensive surveillance program. It is not intended to diagnose infection nor to guide or monitor treatment. Performed at [facility], [place] [address], [place],  [address]    Radiology Studies: No results found. Scheduled Meds: . [MAR Hold] sodium chloride   Intravenous Once  . [MAR Hold] ALPRAZolam  0.25 mg Oral BID  . [MAR Hold] atenolol  50 mg Oral Daily  . [MAR Hold] buPROPion  150 mg Oral Daily  . chlorhexidine  60 mL Topical Once  . [MAR Hold] DULoxetine  60 mg Oral Daily  . feeding supplement  296 mL Oral Once  . [MAR Hold] feeding supplement (PRO-STAT SUGAR FREE 64)  30 mL Oral BID  . [MAR Hold] fluticasone  1 spray Each Nare Daily  . [MAR Hold] gabapentin  300 mg Oral BID  . [MAR Hold] insulin aspart  0-5 Units Subcutaneous QHS  . [MAR Hold] insulin aspart  0-9 Units Subcutaneous TID WC  . [MAR Hold] insulin aspart  4 Units Subcutaneous TID WC  . [MAR Hold] insulin glargine  8 Units Subcutaneous QHS  . [MAR Hold] lactulose  20 g Oral BID  . [MAR Hold] loratadine  10 mg Oral Daily  . [MAR Hold] mesalamine  1,000 mg Rectal QHS  . [MAR Hold] montelukast  10 mg Oral QHS  . [MAR Hold] multivitamin with minerals  1 tablet Oral Daily  . [MAR Hold] mupirocin ointment  1 application Nasal BID  . povidone-iodine  2 application Topical Once  . povidone-iodine  2 application Topical Once  . [MAR Hold] rifaximin  550 mg Oral BID   Continuous Infusions: . [START ON 01/04/2019]  ceFAZolin (ANCEF) IV    . [MAR Hold] cefTRIAXone (ROCEPHIN)  IV 1 g (01/02/19 2119)  . lactated ringers Stopped (12/30/18 0853)  . tranexamic acid    . [MAR Hold] vancomycin 1,000 mg (01/03/19 0948)    LOS: 6  days   Kerney Elbe, DO [facility] PAGER is on [place]  If 7PM-7AM, please contact night-coverage [email or web address] Password [email or web address] 01/03/2019, 1:16 PM

## 2019-01-03 NOTE — TOC Progression Note (Signed)
Transition of Care [facility]) - Progression Note    Patient Details  Name: Brittany Russo MRN: [id number] Date of Birth: 1945-08-26  Transition of Care [facility]) CM/SW Contact  Purcell Mouton, RN Phone Number: 01/03/2019, 11:39 AM  Clinical Narrative:    Pt for surgery and plan to discharge to SNF will stable. [facility] is following.    Expected Discharge Plan: [place]    Expected Discharge Plan and Services Expected Discharge Plan: [place]   Discharge Planning Services: CM Consult   Living arrangements for the past 2 months: Skilled Nursing Facility                                       Social Determinants of Health (SDOH) Interventions    Readmission Risk Interventions No flowsheet data found.

## 2019-01-03 NOTE — Progress Notes (Signed)
PT Cancellation Note  Patient Details Name: Brittany Russo MRN: [id number] DOB: 12-05-1945   Cancelled Treatment:    Reason Eval/Treat Not Completed: Patient at procedure or test/unavailable(Pt had surgery for excision of TKA today, will follow.)  Brittany Russo PT 01/03/2019  Acute Rehabilitation Services Pager [phone] Office [phone]

## 2019-01-03 NOTE — Brief Op Note (Signed)
12/28/2018 - 01/03/2019  6:28 PM  PATIENT:  Brittany Russo  73 y.o. female  PRE-OPERATIVE DIAGNOSIS:  FAILED RIGHT TOTAL KNEE REVISION  POST-OPERATIVE DIAGNOSIS:  FAILED RIGHT TOTAL KNEE REVISION  PROCEDURE:  Procedure(s): EXCISIONAL TOTAL KNEE ARTHROPLASTY (Right)  SURGEON:  Surgeon(s) and Role:    * Paralee Cancel, MD - Primary  PHYSICIAN ASSISTANT: Danae Orleans, PA-C   ANESTHESIA:   regional and general  EBL:  150 mL   BLOOD ADMINISTERED:none  DRAINS: (Medium) Hemovact drain(s) in the right knee with  Suction Open   LOCAL MEDICATIONS USED:  NONE  SPECIMEN:  No Specimen  DISPOSITION OF SPECIMEN:  N/A  COUNTS:  YES  TOURNIQUET:   Total Tourniquet Time Documented: Thigh (Right) - 90 minutes Total: Thigh (Right) - 90 minutes   DICTATION: .Other Dictation: Dictation Number [id number]      PLAN OF CARE: Admit to inpatient   PATIENT DISPOSITION:  PACU - hemodynamically stable.   Delay start of Pharmacological VTE agent (>24hrs) due to surgical blood loss or risk of bleeding: Yes due to thrombocytopenia

## 2019-01-03 NOTE — Progress Notes (Signed)
Patient ID: Brittany Russo, female   DOB: Mar 06, 1946, 73 y.o.   MRN: [id number] Subjective: Day of Surgery Procedure(s) (LRB): EXCISIONAL TOTAL KNEE ARTHROPLASTY (Right)    Patient reports pain as mild.  Objective:   VITALS:   Vitals:   01/02/19 2121 01/03/19 1311  BP: (!) 155/69 (!) 151/62  Pulse: 68 63  Resp: 16 15  Temp: 98.2 F (36.8 C) 98.5 F (36.9 C)  SpO2: 100% 97%    Neurovascular intact  LABS Recent Labs    01/01/19 0420 01/02/19 0449 01/03/19 0454  HGB 8.7* 9.3* 10.1*  HCT 26.7* 29.0* 31.9*  WBC 2.8* 2.8* 3.1*  PLT 38* 39* 76*    Recent Labs    01/01/19 0420 01/02/19 0449 01/03/19 0454  NA 132* 135 133*  K 3.6 4.3 4.3  BUN 9 10 8   CREATININE 0.85 0.68 0.61  GLUCOSE 221* 232* 164*    No results for input(s): LABPT, INR in the last 72 hours.   Assessment/Plan: Day of Surgery Procedure(s) (LRB): EXCISIONAL TOTAL KNEE ARTHROPLASTY (Right)  NPO To OR today to resect her knee Plans reviewed with family Post-op plans depending on intra-op findings during procedure

## 2019-01-03 NOTE — Anesthesia Procedure Notes (Signed)
Procedure Name: Intubation Performed by: Tomiko Schoon J, CRNA Pre-anesthesia Checklist: Patient identified, Emergency Drugs available, Suction available, Patient being monitored and Timeout performed Patient Re-evaluated:Patient Re-evaluated prior to induction Oxygen Delivery Method: Circle system utilized Preoxygenation: Pre-oxygenation with 100% oxygen Induction Type: IV induction Ventilation: Mask ventilation without difficulty Laryngoscope Size: Mac and 4 Grade View: Grade II Tube type: Oral Tube size: 7.0 mm Number of attempts: 1 Airway Equipment and Method: Stylet Placement Confirmation: ETT inserted through vocal cords under direct vision,  positive ETCO2 and breath sounds checked- equal and bilateral Secured at: 21 cm Tube secured with: Tape Dental Injury: Teeth and Oropharynx as per pre-operative assessment        

## 2019-01-04 ENCOUNTER — Encounter (HOSPITAL_COMMUNITY): Payer: Self-pay | Admitting: Orthopedic Surgery

## 2019-01-04 DIAGNOSIS — R739 Hyperglycemia, unspecified: Secondary | ICD-10-CM

## 2019-01-04 DIAGNOSIS — R319 Hematuria, unspecified: Secondary | ICD-10-CM

## 2019-01-04 DIAGNOSIS — T84012A Broken internal right knee prosthesis, initial encounter: Secondary | ICD-10-CM

## 2019-01-04 DIAGNOSIS — N179 Acute kidney failure, unspecified: Secondary | ICD-10-CM

## 2019-01-04 DIAGNOSIS — N390 Urinary tract infection, site not specified: Secondary | ICD-10-CM

## 2019-01-04 DIAGNOSIS — E875 Hyperkalemia: Secondary | ICD-10-CM

## 2019-01-04 DIAGNOSIS — K7290 Hepatic failure, unspecified without coma: Secondary | ICD-10-CM

## 2019-01-04 DIAGNOSIS — R001 Bradycardia, unspecified: Secondary | ICD-10-CM

## 2019-01-04 LAB — PHOSPHORUS: Phosphorus: 5.7 mg/dL — ABNORMAL HIGH (ref 2.5–4.6)

## 2019-01-04 LAB — COMPREHENSIVE METABOLIC PANEL
ALT: 40 U/L (ref 0–44)
AST: 52 U/L — ABNORMAL HIGH (ref 15–41)
Albumin: 2.7 g/dL — ABNORMAL LOW (ref 3.5–5.0)
Alkaline Phosphatase: 97 U/L (ref 38–126)
Anion gap: 12 (ref 5–15)
BUN: 20 mg/dL (ref 8–23)
CO2: 21 mmol/L — ABNORMAL LOW (ref 22–32)
Calcium: 8.4 mg/dL — ABNORMAL LOW (ref 8.9–10.3)
Chloride: 98 mmol/L (ref 98–111)
Creatinine, Ser: 1.16 mg/dL — ABNORMAL HIGH (ref 0.44–1.00)
GFR calc Af Amer: 55 mL/min — ABNORMAL LOW (ref >60)
GFR calc non Af Amer: 48 mL/min — ABNORMAL LOW (ref >60)
Glucose, Bld: 357 mg/dL — ABNORMAL HIGH (ref 70–99)
Potassium: 5.2 mmol/L — ABNORMAL HIGH (ref 3.5–5.1)
Sodium: 131 mmol/L — ABNORMAL LOW (ref 135–145)
Total Bilirubin: 1.1 mg/dL (ref 0.3–1.2)
Total Protein: 5.4 g/dL — ABNORMAL LOW (ref 6.5–8.1)

## 2019-01-04 LAB — CBC WITH DIFFERENTIAL/PLATELET
Abs Immature Granulocytes: 0.04 10*3/uL (ref 0.00–0.07)
Basophils Absolute: 0 10*3/uL (ref 0.0–0.1)
Basophils Relative: 0 %
Eosinophils Absolute: 0 10*3/uL (ref 0.0–0.5)
Eosinophils Relative: 0 %
HCT: 30.7 % — ABNORMAL LOW (ref 36.0–46.0)
Hemoglobin: 9.4 g/dL — ABNORMAL LOW (ref 12.0–15.0)
Immature Granulocytes: 1 %
Lymphocytes Relative: 13 %
Lymphs Abs: 0.9 10*3/uL (ref 0.7–4.0)
MCH: 30.7 pg (ref 26.0–34.0)
MCHC: 30.6 g/dL (ref 30.0–36.0)
MCV: 100.3 fL — ABNORMAL HIGH (ref 80.0–100.0)
Monocytes Absolute: 0.4 10*3/uL (ref 0.1–1.0)
Monocytes Relative: 5 %
Neutro Abs: 5.6 10*3/uL (ref 1.7–7.7)
Neutrophils Relative %: 81 %
Platelets: 103 10*3/uL — ABNORMAL LOW (ref 150–400)
RBC: 3.06 MIL/uL — ABNORMAL LOW (ref 3.87–5.11)
RDW: 16.2 % — ABNORMAL HIGH (ref 11.5–15.5)
WBC: 6.9 10*3/uL (ref 4.0–10.5)
nRBC: 0 % (ref 0.0–0.2)

## 2019-01-04 LAB — GLUCOSE, CAPILLARY
Glucose-Capillary: 296 mg/dL — ABNORMAL HIGH (ref 70–99)
Glucose-Capillary: 333 mg/dL — ABNORMAL HIGH (ref 70–99)
Glucose-Capillary: 407 mg/dL — ABNORMAL HIGH (ref 70–99)
Glucose-Capillary: 397 mg/dL — ABNORMAL HIGH (ref 70–99)

## 2019-01-04 LAB — MAGNESIUM: Magnesium: 1.8 mg/dL (ref 1.7–2.4)

## 2019-01-04 MED ORDER — CHLORHEXIDINE GLUCONATE CLOTH 2 % EX PADS
6.0000 | MEDICATED_PAD | Freq: Every day | CUTANEOUS | Status: DC
  Administered 2019-01-04 – 2019-01-06 (×3): 6 via TOPICAL

## 2019-01-04 MED ORDER — INSULIN ASPART 100 UNIT/ML ~~LOC~~ SOLN
0.0000 [IU] | Freq: Three times a day (TID) | SUBCUTANEOUS | Status: DC
  Administered 2019-01-05: 3 [IU] via SUBCUTANEOUS
  Administered 2019-01-05: 8 [IU] via SUBCUTANEOUS
  Administered 2019-01-05: 3 [IU] via SUBCUTANEOUS
  Administered 2019-01-06: 5 [IU] via SUBCUTANEOUS
  Administered 2019-01-06: 3 [IU] via SUBCUTANEOUS

## 2019-01-04 MED ORDER — TRAMADOL HCL 50 MG PO TABS
50.0000 mg | ORAL_TABLET | Freq: Four times a day (QID) | ORAL | Status: DC | PRN
  Administered 2019-01-04: 50 mg via ORAL
  Administered 2019-01-05: 100 mg via ORAL
  Administered 2019-01-05: 50 mg via ORAL
  Administered 2019-01-06: 100 mg via ORAL
  Filled 2019-01-04 (×2): qty 1
  Filled 2019-01-04 (×2): qty 2

## 2019-01-04 MED ORDER — SODIUM CHLORIDE 0.9 % IV BOLUS
500.0000 mL | Freq: Once | INTRAVENOUS | Status: AC
  Administered 2019-01-04: 500 mL via INTRAVENOUS

## 2019-01-04 NOTE — Progress Notes (Signed)
PROGRESS NOTE    Brittany Russo  JIR:[id number] DOB: [birth year] DOA: 12/28/2018 PCP: Rosalee Kaufman, PA-C   Brief Narrative: Brittany Russo is a 73 y.o. femalewith medical history significant ofliver cirrhosis secondary toNAFLD, insulin-dependent diabetes mellitus, depression, anxiety, hypertension, hyperlipidemia, seizure disorder, recent right knee extensor mechanism repair. She presented from SNF secondary to AMS secondary to hepatic encephalopathy. Course complicated by AKI, UTI with hematuria and need for excisional TKA.   Assessment & Plan:   Principal Problem:   Hepatic encephalopathy ([place]) Active Problems:   AKI (acute kidney injury) ([place])   UTI (urinary tract infection)   Bradycardia   Hyperkalemia   Hyperglycemia   Failed total right knee replacement ([facility])   Acute hepatic encephalopathy Appears resolved with lactulose. -Continue Lactulose 20 g BID for goal of 2-3 soft stools daily and Rifaxamin  Citrobacter Braakii UTI Enterococcus Raffinosus UTI Associated hematuria. Patient currently on Ceftriaxone/Vancomycin. Oncology consulted earlier and recommended outpatient follow-up for possible cystoscopy. Hematuria resovled. -Continue Ceftriaxone/Vancomycin x5 days  AKI Baseline creatinine of about 1. Peak of 1.57 with initial improvement to 0.6. Creatinine bumped back up to 1.16 after receiving IV fluids and s/p surgery. -urine sodium/creatinine -Repeat BMP in AM -Hold IV fluids  Bradycardia PR prolongation Bradycardia resolved. Atenolol resumed and continues to have a normal pulse  Hyperbilirubinemia Bilirubin trended down and resolved. History of cirrhosis.  Diabetes mellitus, insulin dependent Hemoglobin A1C of 6.3%. Acutely worsened blood sugar likely secondary to IV decadron given -Increase SSI to moderate for now and continue Lantus; if effects of steroids do not wear off, may need to increase basal insulin  Failed knee replacement Patient  underwent excisional total knee arthroplasty on 10/13 -PT recommendations: SNF  Hyperkalemia Mild. In setting of recurrent acute kidney injury -Repeat BMP  Hyponatremia Stable. Asymptomatic. Likely related to some fluid retention. May need to start diuresis.  Pancytopenia WBC and platelets improved. Hemoglobin stable.  Obesity Body mass index is 31.65 kg/m.   DVT prophylaxis: SCDs Code Status:   Code Status: Full Code Family Communication: None at bedside Disposition Plan: Discharge to SNF once medically optimized   Consultants:   Orthopedic surgery  Procedures:   10/13: Excisional total knee arthroplasty  Antimicrobials:  Vancomycin  Ceftriaxone    Subjective: No concerns today.  Objective: Vitals:   01/03/19 2106 01/03/19 2234 01/03/19 2357 01/04/19 1341  BP: 116/76 111/77 (!) 110/58 (!) 106/59  Pulse: 83 80 79 89  Resp: 16 18 16 18   Temp: 98 F (36.7 C) 97.9 F (36.6 C) 98 F (36.7 C) 97.9 F (36.6 C)  TempSrc: Oral Oral Oral Oral  SpO2: 100% 100% 100% 97%  Weight:      Height:        Intake/Output Summary (Last 24 hours) at 01/04/2019 1804 Last data filed at 01/04/2019 1100 Gross per 24 hour  Intake 1257.35 ml  Output 475 ml  Net 782.35 ml   Filed Weights   12/29/18 0039 12/30/18 0659  Weight: 78.5 kg 78.5 kg    Examination:  General exam: Appears calm and comfortable Respiratory system: Clear to auscultation. Respiratory effort normal. Cardiovascular system: S1 & S2 heard, RRR. 2/6 systolic murmur Gastrointestinal system: Abdomen is nondistended, soft and nontender. No organomegaly or masses felt. Normal bowel sounds heard. Central nervous system: Alert and oriented. No focal neurological deficits. Extremities: No calf tenderness Skin: No cyanosis. No rashes Psychiatry: Judgement and insight appear normal. Mood & affect appropriate.     Data Reviewed: I have personally  reviewed following labs and imaging studies  CBC: Recent  Labs  Lab 12/31/18 0520 01/01/19 0420 01/02/19 0449 01/03/19 0454 01/04/19 0527  WBC 3.0* 2.8* 2.8* 3.1* 6.9  NEUTROABS 1.9 1.6* 1.6* 1.6* 5.6  HGB 9.7* 8.7* 9.3* 10.1* 9.4*  HCT 30.1* 26.7* 29.0* 31.9* 30.7*  MCV 94.4 95.0 96.7 97.0 100.3*  PLT 45* 38* 39* 76* 532*   Basic Metabolic Panel: Recent Labs  Lab 12/31/18 0520 01/01/19 0420 01/02/19 0449 01/03/19 0454 01/04/19 0527  NA 130* 132* 135 133* 131*  K 3.9 3.6 4.3 4.3 5.2*  CL 99 98 102 101 98  CO2 21* 25 24 24  21*  GLUCOSE 209* 221* 232* 164* 357*  BUN 15 9 10 8 20   CREATININE 1.04* 0.85 0.68 0.61 1.16*  CALCIUM 8.6* 8.3* 8.7* 8.8* 8.4*  MG 1.6* 1.9 1.8 1.9 1.8  PHOS 4.2 4.0 3.9 3.7 5.7*   GFR: Estimated Creatinine Clearance: 43.8 mL/min (A) (by C-G formula based on SCr of 1.16 mg/dL (H)). Liver Function Tests: Recent Labs  Lab 12/31/18 0520 01/01/19 0420 01/02/19 0449 01/03/19 0454 01/04/19 0527  AST 36 34 40 53* 52*  ALT 31 29 34 44 40  ALKPHOS 88 78 85 105 97  BILITOT 1.2 1.0 1.1 1.2 1.1  PROT 5.4* 4.9* 5.1* 5.7* 5.4*  ALBUMIN 2.9* 2.6* 2.7* 2.9* 2.7*   No results for input(s): LIPASE, AMYLASE in the last 168 hours. Recent Labs  Lab 12/28/18 1813 12/29/18 0413 12/31/18 0520 01/01/19 0420 01/03/19 0454  AMMONIA 63* 57* 61* 38* 34   Coagulation Profile: Recent Labs  Lab 12/28/18 1812  INR 1.2   Cardiac Enzymes: No results for input(s): CKTOTAL, CKMB, CKMBINDEX, TROPONINI in the last 168 hours. BNP (last 3 results) No results for input(s): PROBNP in the last 8760 hours. HbA1C: No results for input(s): HGBA1C in the last 72 hours. CBG: Recent Labs  Lab 01/03/19 1842 01/03/19 2110 01/04/19 0801 01/04/19 1122 01/04/19 1647  GLUCAP 139* 181* 296* 333* 407*   Lipid Profile: No results for input(s): CHOL, HDL, LDLCALC, TRIG, CHOLHDL, LDLDIRECT in the last 72 hours. Thyroid Function Tests: No results for input(s): TSH, T4TOTAL, FREET4, T3FREE, THYROIDAB in the last 72 hours. Anemia  Panel: No results for input(s): VITAMINB12, FOLATE, FERRITIN, TIBC, IRON, RETICCTPCT in the last 72 hours. Sepsis Labs: Recent Labs  Lab 12/28/18 1813 12/28/18 2240  LATICACIDVEN 2.5* 1.8    Recent Results (from the past 240 hour(s))  Urine culture     Status: Abnormal   Collection Time: 12/28/18  5:43 PM   Specimen: Urine, Random  Result Value Ref Range Status   Specimen Description   Final    URINE, RANDOM Performed at [place] [facility] [address], [place], [place] [address]    Special Requests   Final    NONE Performed at [facility], [place] [address] [place], [place] [address]    Culture (A)  Final    20,000 COLONIES/mL CITROBACTER BRAAKII >=100,000 COLONIES/mL ENTEROCOCCUS RAFFINOSUS    Report Status 12/31/2018 FINAL  Final   Organism ID, Bacteria CITROBACTER BRAAKII (A)  Final   Organism ID, Bacteria ENTEROCOCCUS RAFFINOSUS (A)  Final      Susceptibility   Citrobacter braakii - MIC*    CEFAZOLIN >=64 RESISTANT Resistant     CEFTRIAXONE <=1 SENSITIVE Sensitive     CIPROFLOXACIN <=0.25 SENSITIVE Sensitive     GENTAMICIN <=1 SENSITIVE Sensitive     IMIPENEM 0.5 SENSITIVE Sensitive  NITROFURANTOIN 256 RESISTANT Resistant     TRIMETH/SULFA <=20 SENSITIVE Sensitive     PIP/TAZO <=4 SENSITIVE Sensitive     * 20,000 COLONIES/mL CITROBACTER BRAAKII   Enterococcus raffinosus - MIC*    AMPICILLIN 16 RESISTANT Resistant     LEVOFLOXACIN 4 INTERMEDIATE Intermediate     NITROFURANTOIN 32 SENSITIVE Sensitive     VANCOMYCIN <=0.5 SENSITIVE Sensitive     * >=100,000 COLONIES/mL ENTEROCOCCUS RAFFINOSUS  SARS CORONAVIRUS 2 (TAT 6-24 HRS) Nasopharyngeal Nasopharyngeal Swab     Status: None   Collection Time: 12/28/18 10:53 PM   Specimen: Nasopharyngeal Swab  Result Value Ref Range Status   SARS Coronavirus 2 NEGATIVE NEGATIVE Final    Comment: (NOTE) SARS-CoV-2 target nucleic acids are NOT DETECTED. The SARS-CoV-2 RNA is generally detectable in  upper and lower respiratory specimens during the acute phase of infection. Negative results do not preclude SARS-CoV-2 infection, do not rule out co-infections with other pathogens, and should not be used as the sole basis for treatment or other patient management decisions. Negative results must be combined with clinical observations, patient history, and epidemiological information. The expected result is Negative. Fact Sheet for Patients: SugarRoll.be Fact Sheet for Healthcare Providers: https://www.woods-mathews.com/ This test is not yet approved or cleared by the [place] FDA and  has been authorized for detection and/or diagnosis of SARS-CoV-2 by FDA under an Emergency Use Authorization (EUA). This EUA will remain  in effect (meaning this test can be used) for the duration of the COVID-19 declaration under Section 56 4(b)(1) of the Act, 21 U.S.C. section 360bbb-3(b)(1), unless the authorization is terminated or revoked sooner. Performed at [facility], [place] [address] [place], [place] [address]   MRSA PCR Screening     Status: None   Collection Time: 12/29/18  7:41 AM   Specimen: Nasal Mucosa; Nasopharyngeal  Result Value Ref Range Status   MRSA by PCR NEGATIVE NEGATIVE Final    Comment:        The GeneXpert MRSA Assay (FDA approved for NASAL specimens only), is one component of a comprehensive MRSA colonization surveillance program. It is not intended to diagnose MRSA infection nor to guide or monitor treatment for MRSA infections. Performed at [facility], [place] [address], [place], [place] [address]   Culture, blood (routine x 2)     Status: None   Collection Time: 12/29/18  9:08 AM   Specimen: BLOOD RIGHT HAND  Result Value Ref Range Status   Specimen Description   Final    BLOOD RIGHT HAND Performed at [place] [address] [place], [place] [address]    Special  Requests   Final    BOTTLES DRAWN AEROBIC ONLY Blood Culture adequate volume Performed at [place] [address], [place], [place] [address]    Culture   Final    NO GROWTH 5 DAYS Performed at [facility], [place] [address], [place], [place] [address]    Report Status 01/03/2019 FINAL  Final  Culture, blood (routine x 2)     Status: None   Collection Time: 12/29/18  9:08 AM   Specimen: BLOOD LEFT HAND  Result Value Ref Range Status   Specimen Description   Final    BLOOD LEFT HAND Performed at [place] [address], [place], [place] [address]    Special Requests   Final    BOTTLES DRAWN AEROBIC ONLY Blood Culture results may not be optimal  due to an inadequate volume of blood received in culture bottles Performed at [place] [address] [place], [place] [address]    Culture   Final    NO GROWTH 5 DAYS Performed at [facility], [place] [address] [place], [place] [address]    Report Status 01/03/2019 FINAL  Final  Surgical PCR screen     Status: None   Collection Time: 01/03/19 12:30 AM   Specimen: Nasal Mucosa; Nasal Swab  Result Value Ref Range Status   MRSA, PCR NEGATIVE NEGATIVE Final   Staphylococcus aureus NEGATIVE NEGATIVE Final    Comment: (NOTE) The Xpert SA Assay (FDA approved for NASAL specimens in patients 29 years of age and older), is one component of a comprehensive surveillance program. It is not intended to diagnose infection nor to guide or monitor treatment. Performed at [facility], [place] [address], [place], [place] [address]          Radiology Studies: No results found.      Scheduled Meds: . sodium chloride   Intravenous Once  . ALPRAZolam  0.25 mg Oral BID  . atenolol  50 mg Oral Daily  . buPROPion  150 mg Oral Daily  . Chlorhexidine Gluconate Cloth  6 each Topical Daily  . docusate sodium  100 mg Oral BID  . DULoxetine  60 mg Oral  Daily  . feeding supplement (PRO-STAT SUGAR FREE 64)  30 mL Oral BID  . ferrous sulfate  325 mg Oral BID WC  . fluticasone  1 spray Each Nare Daily  . gabapentin  300 mg Oral BID  . insulin aspart  0-5 Units Subcutaneous QHS  . insulin aspart  0-9 Units Subcutaneous TID WC  . insulin aspart  4 Units Subcutaneous TID WC  . insulin glargine  8 Units Subcutaneous QHS  . lactulose  20 g Oral BID  . loratadine  10 mg Oral Daily  . mesalamine  1,000 mg Rectal QHS  . montelukast  10 mg Oral QHS  . multivitamin with minerals  1 tablet Oral Daily  . mupirocin ointment  1 application Nasal BID  . polyethylene glycol  17 g Oral BID  . rifaximin  550 mg Oral BID   Continuous Infusions: . sodium chloride 75 mL/hr at 01/04/19 0706  . cefTRIAXone (ROCEPHIN)  IV 1 g (01/03/19 2122)  . lactated ringers 10 mL/hr at 01/03/19 1347  . vancomycin 1,000 mg (01/04/19 0933)     LOS: 7 days     Cordelia Poche, MD [facility] 01/04/2019, 6:04 PM  If 7PM-7AM, please contact night-coverage [email or web address]

## 2019-01-04 NOTE — Progress Notes (Signed)
Physical Therapy Treatment Patient Details Name: Brittany Russo MRN: [id number] DOB: 02/23/1946 Today's Date: 01/04/2019    History of Present Illness Pt is a 73 y.o. female with multiple admissions 10/10/18 for lower GI bleed and 09/22/18 for R TKA with subsequent fall resulting in readmission with R distal femur fx s/p TKA repair; also with PMH includes HTN, DM2, obesity.  Admission for repair right knee extensor mechanism on 11/22/18. 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  Pt currently admitted 12/28/18 for Acute hepatic encephalopathy. Pt is now s/p resection of R TKA revision with Girdlestone procedure of the knee.    PT Comments    Pt is s/p resection of R TKA revision with Girdlestone procedure (01/03/19).  She is now R LE NWB with NO ROM R LE. She was able to tolerate sitting EOB with R leg supported with KI intact and perform L LE LAQs.  Instructed in supine L LE therex as well as theraband UE exercises. Con't to recommend SNF.   Follow Up Recommendations  SNF;Supervision/Assistance - 24 hour     Equipment Recommendations  None recommended by PT    Recommendations for Other Services       Precautions / Restrictions Precautions Precautions: Fall;Other (comment)(NO ROM R knee) Knee Immobilizer - Right: On at all times Restrictions Weight Bearing Restrictions: Yes RLE Weight Bearing: Non weight bearing    Mobility  Bed Mobility Overal bed mobility: Needs Assistance Bed Mobility: Supine to Sit;Sit to Supine     Supine to sit: Mod assist;+2 for physical assistance Sit to supine: Max assist;+2 for physical assistance   General bed mobility comments: use of bed pads to get to EOB and return supine. Pt gives forth good effort to re-position at EOB.  Transfers                 General transfer comment: NT  Ambulation/Gait                 Stairs             Wheelchair Mobility    Modified  Rankin (Stroke Patients Only)       Balance Overall balance assessment: Needs assistance Sitting-balance support: Bilateral upper extremity supported Sitting balance-Leahy Scale: Fair Sitting balance - Comments: Improved the longer she sat. R LE supported throughout sitting.                                    Cognition Arousal/Alertness: Awake/alert Behavior During Therapy: WFL for tasks assessed/performed Overall Cognitive Status: Within Functional Limits for tasks assessed                                 General Comments: Pt with good recall of surgery she just had and her past medical issues, but did state she got to the hospital yesterday and she has been here 6 days      Exercises General Exercises - Upper Extremity Shoulder Horizontal ABduction: Theraband;10 reps;Both General Exercises - Lower Extremity Ankle Circles/Pumps: Both;10 reps Gluteal Sets: Strengthening;5 reps Long Arc Quad: Left;10 reps;Seated Heel Slides: AROM;Left;5 reps;Supine Hip ABduction/ADduction: AROM;Left;5 reps Straight Leg Raises: AROM;Left;5 reps    General Comments General comments (skin integrity, edema, etc.): Pt issued theraband for UE exercises. She was familiar with it and began doing exercises in a diagonal PNF  pattern.      Pertinent Vitals/Pain Pain Assessment: Faces Faces Pain Scale: Hurts a little bit Pain Location: lower R leg Pain Descriptors / Indicators: Grimacing Pain Intervention(s): Limited activity within patient's tolerance;Monitored during session;Repositioned    Home Living                      Prior Function            PT Goals (current goals can now be found in the care plan section) Acute Rehab PT Goals Patient Stated Goal: to get L leg and arms stronger PT Goal Formulation: With patient Time For Goal Achievement: 01/12/19 Potential to Achieve Goals: Fair Progress towards PT goals: Progressing toward goals     Frequency    Min 2X/week      PT Plan Current plan remains appropriate    Co-evaluation              AM-PAC PT "6 Clicks" Mobility   Outcome Measure  Help needed turning from your back to your side while in a flat bed without using bedrails?: A Lot Help needed moving from lying on your back to sitting on the side of a flat bed without using bedrails?: A Lot Help needed moving to and from a bed to a chair (including a wheelchair)?: Total Help needed standing up from a chair using your arms (e.g., wheelchair or bedside chair)?: Total Help needed to walk in hospital room?: Total Help needed climbing 3-5 steps with a railing? : Total 6 Click Score: 8    End of Session   Activity Tolerance: Patient tolerated treatment well Patient left: in bed;with call bell/phone within reach;with bed alarm set   PT Visit Diagnosis: Muscle weakness (generalized) (M62.81);Other abnormalities of gait and mobility (R26.89) Pain - Right/Left: Right Pain - part of body: Knee     Time: 1340-1415 PT Time Calculation (min) (ACUTE ONLY): 35 min  Charges:  $Therapeutic Exercise: 8-22 mins $Therapeutic Activity: 8-22 mins                     [name] L. Tamala Julian, [place] Pager [phone] 01/04/2019    [name] 01/04/2019, 3:14 PM

## 2019-01-04 NOTE — Progress Notes (Signed)
Foley discontinued per protocol and Purewick applied. SRP, RN

## 2019-01-04 NOTE — Progress Notes (Signed)
Pharmacy Antibiotic Note  Brittany Russo is a 73 y.o. female admitted on 12/28/2018 with altered mental status.  Pharmacy has been consulted for vancomcycin dosing for UTI.  Started on Rocephin at 10 pm on 10/7.  To add vancomycin now for enterococcus raffinosus in UCx. WBC low at 2.8, Scr WNL at 0.85. AF. Denied dysuria on admit but had hematuria.   Today, 01/04/2019:  D8 Rocephin, D4 vancomycin  WBC increased to WNL  Remains Afebrile > 7d  AKI on admission had resolved, now SCr creeping back up today  Plan:   Continue vancomycin 1g IV q24 for today; AUC slightly high with increased SCr, but likely stop tomorrow  Continue on CTX 1 gm q24  Vancomycin levels if > 5 days planned  Height: 5\' 2"  (157.5 cm) Weight: 173 lb 1 oz (78.5 kg) IBW/kg (Calculated) : 50.1  Temp (24hrs), Avg:98 F (36.7 C), Min:97.4 F (36.3 C), Max:98.6 F (37 C)  Recent Labs  Lab 12/28/18 1813 12/28/18 2240  12/31/18 0520 01/01/19 0420 01/02/19 0449 01/03/19 0454 01/04/19 0527  WBC  --   --    < > 3.0* 2.8* 2.8* 3.1* 6.9  CREATININE  --   --    < > 1.04* 0.85 0.68 0.61 1.16*  LATICACIDVEN 2.5* 1.8  --   --   --   --   --   --    < > = values in this interval not displayed.    Estimated Creatinine Clearance: 43.8 mL/min (A) (by C-G formula based on SCr of 1.16 mg/dL (H)).    Allergies  Allergen Reactions  . Asa [Aspirin] Other (See Comments)    Pt not allergic but cannot take due to bleeding   . Other Other (See Comments)    Any jewelry metal-hives and itching     Antimicrobials this admission: 10/7 CTX>> 10/11 vanc>> Dose adjustments this admission:  Microbiology results: 10/8 MRSA PCR neg 10/8 BCx2: ngtd 10/10 UCx: > 100K enterococcus raffinosus R amp, Int Lvq, Send NTF and Vanc; also 20 K citrobacter braakii R to amp and NFT but sens to all others abx tested including CTX   Thank you for allowing pharmacy to be a part of this patient's care.  Reuel Boom, PharmD,  BCPS [phone] 01/04/2019, 4:31 PM

## 2019-01-04 NOTE — Progress Notes (Signed)
Low urine output, bladder scan 15cc. On call provider notified and NS bolus ordered and given, will continue to assess patient.

## 2019-01-04 NOTE — Progress Notes (Signed)
Inpatient Diabetes Program Recommendations  AACE/ADA: New Consensus Statement on Inpatient Glycemic Control (2015)  Target Ranges:  Prepandial:   less than 140 mg/dL      Peak postprandial:   less than 180 mg/dL (1-2 hours)      Critically ill patients:  140 - 180 mg/dL   Lab Results  Component Value Date   GLUCAP 296 (H) 01/04/2019   HGBA1C 6.3 (H) 12/28/2018    Review of Glycemic Control Results for CYRILLA, VANWYE (MRN [id number]) as of 01/04/2019 09:59  Ref. Range 01/03/2019 07:22 01/03/2019 11:05 01/03/2019 13:14 01/03/2019 18:42 01/03/2019 21:10 01/04/2019 08:01  Glucose-Capillary Latest Ref Range: 70 - 99 mg/dL 145 (H) 143 (H) 144 (H) 139 (H) 181 (H) 296 (H)   Diabetes history: DM 2 Home DM Meds: Glipizide 5 mg BID                             Lantus 65 units Daily                             Janumet 50/500 mg BID  Current orders for Inpatient glycemic control: Lantus 8 units qhs                                                                          Novolog 0-9 units tid + hs       Novolog 4 units tid meal coverage  Inpatient Diabetes Program Recommendations:    Glucose 296 this am.  Pt received one dose of Decadron 10 mg yesterday and again today.  Trends should start to decrease within 24-48 hours.  Consider increasing Novolog correction to 0-15 units.  Consider increasing Lantus to 10 units. Will need to titrate back down in 24-48 hours.  Thanks,  Tama Headings RN, MSN, BC-ADM Inpatient Diabetes Coordinator Team Pager [phone] (8a-5p)

## 2019-01-04 NOTE — Op Note (Signed)
NAMECARIANNE, TAIRA MEDICAL RECORD [id number] ACCOUNT [id number] DATE OF BIRTH:04/16/1945 FACILITY[facility] LOCATION: [facility] PHYSICIAN:Keiton Cosma Marian Sorrow, MD  OPERATIVE REPORT  DATE OF PROCEDURE:  01/03/2019  PREOPERATIVE DIAGNOSIS:  Failed right total knee revision.  POSTOPERATIVE DIAGNOSIS:  Failed right total knee revision.  PROCEDURE:  Resection of right total knee revision arthroplasty with Girdlestone procedure of the knee.  SURGEON:  Paralee Cancel, MD  ASSISTANT:  Danae Orleans, PA-C.  Note that Mr. Guinevere Scarlet was present for the entirety of the case from preoperative position, perioperative management of the operative extremity, general facilitation of the case, and primary wound closure.  ANESTHESIA:  Regional plus general.  BLOOD LOSS:  About 150 mL.  BLOOD ADMINISTERED:  None.  DRAINS:  One medium Hemovac.  TOURNIQUET:  Up for 90 minutes at 250 mmHg.  COMPLICATIONS:  None apparent.  INDICATION FOR PROCEDURE:  The patient is a 73 year old female who underwent under an index total knee arthroplasty.  She unfortunately had a fall in the immediate postoperative period that resulted in a severe distal femur fracture, basically shearing  off her distal femoral condyle.  She was taken back to the operating room and had this removed and had a revision to a constrained hinged knee replacement.  During the postoperative period, it was noted that her extensor mechanism became deficient with a  high-riding patella.  She was taken back to the operating room and at the time found that her entire inferior extensor mechanism retinacular tissue were basically destroyed from some mechanism that is unknown.  There was no trauma noted.  It could have  been an attritional wear from the prosthesis, but nonetheless we had a failed extensor mechanism with this hinged implant.  This resulted in lack of retinacular tissue coverage of the prosthesis and direct pressure on her skin.  She was failing  to thrive  and failing to function with this knee as she was unable to get full extension and flexion.  I have had lengthy discussions with the family regarding next best options for her as this case has represented an extreme challenge.  The decision at this  point has been to resect the knee, allow her soft tissues to heal, and determine if other options are available including amputation versus effusion.  Consent was obtained after this lengthy discussion with the patient and family.  PROCEDURE IN DETAIL:  The patient was brought to the operative theater.  Once adequate anesthesia and preoperative antibiotics were administered, as well as tranexamic acid, Ancef, and TXA, she was positioned supine.  A right thigh tourniquet was placed.   The right lower extremity was prepped and draped in sterile fashion.  Timeout was performed identifying the patient, the planned procedure, and extremity.  The leg was exsanguinated, tourniquet elevated to 250 mmHg.  Her old incision was utilized.   Soft tissue planes created.  When I entered into her knee, I did find that her extensor mechanism and retinaculum that had been repaired at the last procedure was intact.  I did perform a median arthrotomy, including around this high-rising patella.  The  knee was exposed.  At this point, the component was intact; however, there was what appeared to be a significant external rotation of her hip that resulted in her flexed posture and the result of the component coming in contact with the skin.   Identifying this, we removed the pin of the hinged knee.  I then removed the polyethylene.  At this point, significant attention was  at removing her implants as was expected and predicted a significant challenge.  I had to basically perform an osteotomy  on the tibia which was removed for a press-fit component, resulting in fragmentation of her proximal tibia.  The implant was removed.  This included cement and the component.  On the  femoral side, we removed the femoral component and again met  significant challenges, removing what appeared to be a well ingrown femoral component with cement. Once I got the components removed as well as the cement, we irrigated the knee with about 5 L normal saline solution.  I did place 2 cables around the  proximal tibia bone fragments that were removed to keep some of this intact.  The distal femur was fragmented in getting the component out and was unable to be salvaged as there was no way to perform an osteotomy as the weight of the bone was well fixed  and the nature of the remaining bone stock.  Given these findings and the planned procedure, following irrigation, we let the tourniquet down.  There was no significant hemostasis required and no active bleeding.  I reapproximated the extensor mechanism  using a combination of 1 Vicryl and Stratafix suture.  We placed a medium Hemovac drain deep into the wound.  I then reapproximated the skin with 2-0 Vicryl and staples on the skin.  The knee was then dressed with Xeroform and a bulky dressing and placed  in a knee immobilizer.  These findings were reviewed with her husband.  She was brought to the recovery room in stable condition.  Postoperatively, we will need to follow closely and monitor her healing response and then determine the next best steps as I will have lengthy discussions with the family regarding options and available outcomes.  My hope is that we will be able to gain  strength in her upper extremity and right lower extremity for pivot transfer to chair.  In addition, we will work hard to work on the soft tissue healing and determine if we are able to tolerate this Girdlestone procedure versus perform other procedure.  LN/NUANCE  D:01/03/2019 T:01/04/2019 JOB:[id number]

## 2019-01-04 NOTE — Consult Note (Signed)
   [facility] CM Inpatient Consult   01/04/2019  Brittany Russo 10-May-1945 [id number]   Patient chart has been reviewed for readmissions less than 30 days and for high risk score, 33%, for unplanned readmissions.  Patient assessed for community [facility] follow up needs.  Chart review reveals patient current disposition plan is for SNF. No [facility] CM needs.  Netta Cedars, MSN, [facility] Liaison Nurse Mobile Phone [phone]  Toll free office [phone]

## 2019-01-04 NOTE — Progress Notes (Signed)
     Subjective: 1 Day Post-Op Procedure(s) (LRB): EXCISIONAL TOTAL KNEE ARTHROPLASTY (Right)   Patient reports pain as mild, pain appears to be controlled.  No reported events throughout the night.  Procedure, findings and restrictions moving forward were reviewed with the patient.     Objective:   VITALS:   Vitals:   01/03/19 2234 01/03/19 2357  BP: 111/77 (!) 110/58  Pulse: 80 79  Resp: 18 16  Temp: 97.9 F (36.6 C) 98 F (36.7 C)  SpO2: 100% 100%    Incision: dressing C/D/I No cellulitis present Compartment soft HV in places and appears to be working well  LABS Recent Labs    01/02/19 0449 01/03/19 0454 01/04/19 0527  HGB 9.3* 10.1* 9.4*  HCT 29.0* 31.9* 30.7*  WBC 2.8* 3.1* 6.9  PLT 39* 76* 103*    Recent Labs    01/02/19 0449 01/03/19 0454 01/04/19 0527  NA 135 133* 131*  K 4.3 4.3 5.2*  BUN 10 8 20   CREATININE 0.68 0.61 1.16*  GLUCOSE 232* 164* 357*     Assessment/Plan: 1 Day Post-Op Procedure(s) (LRB): EXCISIONAL TOTAL KNEE ARTHROPLASTY (Right) Maintain HV today and will reevaluate it tomorrow for output. Up with therapy as tolerated, NWB right LE Discharge disposition TBD    [place] Pugh. Lux Meaders   PAC  01/04/2019, 10:01 AM

## 2019-01-05 LAB — HEMOGLOBIN AND HEMATOCRIT, BLOOD
HCT: 22.6 % — ABNORMAL LOW (ref 36.0–46.0)
HCT: 26.6 % — ABNORMAL LOW (ref 36.0–46.0)
Hemoglobin: 7 g/dL — ABNORMAL LOW (ref 12.0–15.0)
Hemoglobin: 8.5 g/dL — ABNORMAL LOW (ref 12.0–15.0)

## 2019-01-05 LAB — CBC
HCT: 26.2 % — ABNORMAL LOW (ref 36.0–46.0)
HCT: 21 % — ABNORMAL LOW (ref 36.0–46.0)
Hemoglobin: 8.2 g/dL — ABNORMAL LOW (ref 12.0–15.0)
Hemoglobin: 6.7 g/dL — CL (ref 12.0–15.0)
MCH: 31.4 pg (ref 26.0–34.0)
MCH: 31.8 pg (ref 26.0–34.0)
MCHC: 31.3 g/dL (ref 30.0–36.0)
MCHC: 31.9 g/dL (ref 30.0–36.0)
MCV: 100.4 fL — ABNORMAL HIGH (ref 80.0–100.0)
MCV: 99.5 fL (ref 80.0–100.0)
Platelets: 71 10*3/uL — ABNORMAL LOW (ref 150–400)
Platelets: 69 10*3/uL — ABNORMAL LOW (ref 150–400)
RBC: 2.61 MIL/uL — ABNORMAL LOW (ref 3.87–5.11)
RBC: 2.11 MIL/uL — ABNORMAL LOW (ref 3.87–5.11)
RDW: 16.5 % — ABNORMAL HIGH (ref 11.5–15.5)
RDW: 16.4 % — ABNORMAL HIGH (ref 11.5–15.5)
WBC: 7.6 10*3/uL (ref 4.0–10.5)
WBC: 6.6 10*3/uL (ref 4.0–10.5)
nRBC: 0 % (ref 0.0–0.2)
nRBC: 0 % (ref 0.0–0.2)

## 2019-01-05 LAB — POTASSIUM: Potassium: 4.6 mmol/L (ref 3.5–5.1)

## 2019-01-05 LAB — BASIC METABOLIC PANEL
Anion gap: 8 (ref 5–15)
BUN: 27 mg/dL — ABNORMAL HIGH (ref 8–23)
CO2: 21 mmol/L — ABNORMAL LOW (ref 22–32)
Calcium: 8.3 mg/dL — ABNORMAL LOW (ref 8.9–10.3)
Chloride: 101 mmol/L (ref 98–111)
Creatinine, Ser: 0.81 mg/dL (ref 0.44–1.00)
GFR calc Af Amer: >60 mL/min (ref >60)
GFR calc non Af Amer: >60 mL/min (ref >60)
Glucose, Bld: 304 mg/dL — ABNORMAL HIGH (ref 70–99)
Potassium: 5.2 mmol/L — ABNORMAL HIGH (ref 3.5–5.1)
Sodium: 130 mmol/L — ABNORMAL LOW (ref 135–145)

## 2019-01-05 LAB — GLUCOSE, CAPILLARY
Glucose-Capillary: 267 mg/dL — ABNORMAL HIGH (ref 70–99)
Glucose-Capillary: 171 mg/dL — ABNORMAL HIGH (ref 70–99)
Glucose-Capillary: 170 mg/dL — ABNORMAL HIGH (ref 70–99)
Glucose-Capillary: 234 mg/dL — ABNORMAL HIGH (ref 70–99)

## 2019-01-05 LAB — PREPARE RBC (CROSSMATCH)

## 2019-01-05 MED ORDER — INSULIN GLARGINE 100 UNIT/ML ~~LOC~~ SOLN
12.0000 [IU] | Freq: Every day | SUBCUTANEOUS | Status: DC
  Administered 2019-01-05: 12 [IU] via SUBCUTANEOUS
  Filled 2019-01-05 (×2): qty 0.12

## 2019-01-05 MED ORDER — SODIUM CHLORIDE 0.9% IV SOLUTION
Freq: Once | INTRAVENOUS | Status: AC
  Administered 2019-01-05: 18:00:00 via INTRAVENOUS

## 2019-01-05 NOTE — Progress Notes (Signed)
PROGRESS NOTE    Brittany Russo  TDS:[id number] DOB: 04-16-1945 DOA: 12/28/2018 PCP: Rosalee Kaufman, PA-C   Brief Narrative: Brittany Russo is a 73 y.o. femalewith medical history significant ofliver cirrhosis secondary toNAFLD, insulin-dependent diabetes mellitus, depression, anxiety, hypertension, hyperlipidemia, seizure disorder, recent right knee extensor mechanism repair. She presented from SNF secondary to AMS secondary to hepatic encephalopathy. Course complicated by AKI, UTI with hematuria and need for excisional TKA.   Assessment & Plan:   Principal Problem:   Hepatic encephalopathy ([place]) Active Problems:   AKI (acute kidney injury) ([place])   UTI (urinary tract infection)   Bradycardia   Hyperkalemia   Hyperglycemia   Failed total right knee replacement ([facility])   Acute hepatic encephalopathy Appears resolved with lactulose. -Continue Lactulose 20 g BID for goal of 2-3 soft stools daily and Rifaxamin  Citrobacter Braakii UTI Enterococcus Raffinosus UTI Associated hematuria. Patient currently on Ceftriaxone/Vancomycin. Oncology consulted earlier and recommended outpatient follow-up for possible cystoscopy. Hematuria resovled. Discontinue antibiotics today.  AKI Baseline creatinine of about 1. Peak of 1.57 with initial improvement to 0.6. Creatinine bumped back up to 1.16 after receiving IV fluids and s/p surgery. Back down again to baseline -Hold IV fluids  Bradycardia PR prolongation Bradycardia resolved. Atenolol resumed and continues to have a normal pulse  Hyperbilirubinemia Bilirubin trended down and resolved. History of cirrhosis.  Diarrhea Partly secondary to hepatic encephalopathy treatment. -Discontinue scheduled Miralax  Diabetes mellitus, insulin dependent Hemoglobin A1C of 6.3%. Acutely worsened blood sugar likely secondary to IV decadron given -Increase SSI to moderate for now and continue Lantus; if effects of steroids do not wear off, may  need to increase basal insulin  Failed knee replacement Patient underwent excisional total knee arthroplasty on 10/13 -PT recommendations: SNF  Hyperkalemia Mild. In setting of recurrent acute kidney injury -Repeat BMP  Hyponatremia Stable. Asymptomatic. Likely related to some fluid retention. May need to start diuresis.  Pancytopenia WBC and platelets improved. Hemoglobin trended down significantly.  Acute blood loss anemia In setting of recent surgery -Transfuse 1 unit PRBC today; repeat H&H post transfusion and repeat CBC in AM  Obesity Body mass index is 31.65 kg/m.   DVT prophylaxis: SCDs Code Status:   Code Status: Full Code Family Communication: None at bedside Disposition Plan: Discharge to SNF once medically optimized; hopefully in 1 to 2 days. COVID-19 test ordered 10/15   Consultants:   Orthopedic surgery  Procedures:   10/13: Excisional total knee arthroplasty  Antimicrobials:  Vancomycin  Ceftriaxone    Subjective: No issues. Concerned about her future inability to bear weight. Contemplating an AKA and subsequent prosthetic as something in her future.  Objective: Vitals:   01/03/19 2234 01/03/19 2357 01/04/19 1341 01/05/19 0630  BP: 111/77 (!) 110/58 (!) 106/59 (!) 116/58  Pulse: 80 79 89 78  Resp: 18 16 18 16   Temp: 97.9 F (36.6 C) 98 F (36.7 C) 97.9 F (36.6 C) 98.1 F (36.7 C)  TempSrc: Oral Oral Oral Oral  SpO2: 100% 100% 97% 91%  Weight:      Height:        Intake/Output Summary (Last 24 hours) at 01/05/2019 1217 Last data filed at 01/05/2019 0615 Gross per 24 hour  Intake 480 ml  Output 800 ml  Net -320 ml   Filed Weights   12/29/18 0039 12/30/18 0659  Weight: 78.5 kg 78.5 kg    Examination:  General exam: Appears calm and comfortable Respiratory system: Clear to auscultation. Respiratory effort normal. Cardiovascular  system: S1 & S2 heard, RRR. Gastrointestinal system: Abdomen is nondistended, soft and nontender.  No organomegaly or masses felt. Normal bowel sounds heard. Central nervous system: Alert and oriented. No focal neurological deficits. Extremities: Right foot with some edema. Right leg in immobilizer. No calf tenderness Skin: No cyanosis. No rashes Psychiatry: Judgement and insight appear normal. Mood & affect appropriate.      Data Reviewed: I have personally reviewed following labs and imaging studies  CBC: Recent Labs  Lab 12/31/18 0520 01/01/19 0420 01/02/19 0449 01/03/19 0454 01/04/19 0527 01/05/19 0418 01/05/19 1132  WBC 3.0* 2.8* 2.8* 3.1* 6.9 7.6 6.6  NEUTROABS 1.9 1.6* 1.6* 1.6* 5.6  --   --   HGB 9.7* 8.7* 9.3* 10.1* 9.4* 8.2* 6.7*  HCT 30.1* 26.7* 29.0* 31.9* 30.7* 26.2* 21.0*  MCV 94.4 95.0 96.7 97.0 100.3* 100.4* 99.5  PLT 45* 38* 39* 76* 103* 71* 69*   Basic Metabolic Panel: Recent Labs  Lab 12/31/18 0520 01/01/19 0420 01/02/19 0449 01/03/19 0454 01/04/19 0527 01/05/19 0418  NA 130* 132* 135 133* 131* 130*  K 3.9 3.6 4.3 4.3 5.2* 5.2*  CL 99 98 102 101 98 101  CO2 21* 25 24 24  21* 21*  GLUCOSE 209* 221* 232* 164* 357* 304*  BUN 15 9 10 8 20  27*  CREATININE 1.04* 0.85 0.68 0.61 1.16* 0.81  CALCIUM 8.6* 8.3* 8.7* 8.8* 8.4* 8.3*  MG 1.6* 1.9 1.8 1.9 1.8  --   PHOS 4.2 4.0 3.9 3.7 5.7*  --    GFR: Estimated Creatinine Clearance: 62.7 mL/min (by C-G formula based on SCr of 0.81 mg/dL). Liver Function Tests: Recent Labs  Lab 12/31/18 0520 01/01/19 0420 01/02/19 0449 01/03/19 0454 01/04/19 0527  AST 36 34 40 53* 52*  ALT 31 29 34 44 40  ALKPHOS 88 78 85 105 97  BILITOT 1.2 1.0 1.1 1.2 1.1  PROT 5.4* 4.9* 5.1* 5.7* 5.4*  ALBUMIN 2.9* 2.6* 2.7* 2.9* 2.7*   No results for input(s): LIPASE, AMYLASE in the last 168 hours. Recent Labs  Lab 12/31/18 0520 01/01/19 0420 01/03/19 0454  AMMONIA 61* 38* 34   Coagulation Profile: No results for input(s): INR, PROTIME in the last 168 hours. Cardiac Enzymes: No results for input(s): CKTOTAL, CKMB,  CKMBINDEX, TROPONINI in the last 168 hours. BNP (last 3 results) No results for input(s): PROBNP in the last 8760 hours. HbA1C: No results for input(s): HGBA1C in the last 72 hours. CBG: Recent Labs  Lab 01/04/19 0801 01/04/19 1122 01/04/19 1647 01/04/19 2105 01/05/19 0749  GLUCAP 296* 333* 407* 397* 267*   Lipid Profile: No results for input(s): CHOL, HDL, LDLCALC, TRIG, CHOLHDL, LDLDIRECT in the last 72 hours. Thyroid Function Tests: No results for input(s): TSH, T4TOTAL, FREET4, T3FREE, THYROIDAB in the last 72 hours. Anemia Panel: No results for input(s): VITAMINB12, FOLATE, FERRITIN, TIBC, IRON, RETICCTPCT in the last 72 hours. Sepsis Labs: No results for input(s): PROCALCITON, LATICACIDVEN in the last 168 hours.  Recent Results (from the past 240 hour(s))  Urine culture     Status: Abnormal   Collection Time: 12/28/18  5:43 PM   Specimen: Urine, Random  Result Value Ref Range Status   Specimen Description   Final    URINE, RANDOM Performed at [facility], [address] [place], [place] [address]    Special Requests   Final    NONE Performed at [facility], [place] [address], [place], [place] [address]    Culture (A)  Final    20,000 COLONIES/mL CITROBACTER BRAAKII >=100,000 COLONIES/mL ENTEROCOCCUS RAFFINOSUS    Report Status 12/31/2018 FINAL  Final   Organism ID, Bacteria CITROBACTER BRAAKII (A)  Final   Organism ID, Bacteria ENTEROCOCCUS RAFFINOSUS (A)  Final      Susceptibility   Citrobacter braakii - MIC*    CEFAZOLIN >=64 RESISTANT Resistant     CEFTRIAXONE <=1 SENSITIVE Sensitive     CIPROFLOXACIN <=0.25 SENSITIVE Sensitive     GENTAMICIN <=1 SENSITIVE Sensitive     IMIPENEM 0.5 SENSITIVE Sensitive     NITROFURANTOIN 256 RESISTANT Resistant     TRIMETH/SULFA <=20 SENSITIVE Sensitive     PIP/TAZO <=4 SENSITIVE Sensitive     * 20,000 COLONIES/mL CITROBACTER BRAAKII   Enterococcus raffinosus - MIC*    AMPICILLIN 16 RESISTANT  Resistant     LEVOFLOXACIN 4 INTERMEDIATE Intermediate     NITROFURANTOIN 32 SENSITIVE Sensitive     VANCOMYCIN <=0.5 SENSITIVE Sensitive     * >=100,000 COLONIES/mL ENTEROCOCCUS RAFFINOSUS  SARS CORONAVIRUS 2 (TAT 6-24 HRS) Nasopharyngeal Nasopharyngeal Swab     Status: None   Collection Time: 12/28/18 10:53 PM   Specimen: Nasopharyngeal Swab  Result Value Ref Range Status   SARS Coronavirus 2 NEGATIVE NEGATIVE Final    Comment: (NOTE) SARS-CoV-2 target nucleic acids are NOT DETECTED. The SARS-CoV-2 RNA is generally detectable in upper and lower respiratory specimens during the acute phase of infection. Negative results do not preclude SARS-CoV-2 infection, do not rule out co-infections with other pathogens, and should not be used as the sole basis for treatment or other patient management decisions. Negative results must be combined with clinical observations, patient history, and epidemiological information. The expected result is Negative. Fact Sheet for Patients: SugarRoll.be Fact Sheet for Healthcare Providers: https://www.woods-mathews.com/ This test is not yet approved or cleared by the [place] FDA and  has been authorized for detection and/or diagnosis of SARS-CoV-2 by FDA under an Emergency Use Authorization (EUA). This EUA will remain  in effect (meaning this test can be used) for the duration of the COVID-19 declaration under Section 56 4(b)(1) of the Act, 21 U.S.C. section 360bbb-3(b)(1), unless the authorization is terminated or revoked sooner. Performed at [facility], [place] [address], [place], [place] [address]   MRSA PCR Screening     Status: None   Collection Time: 12/29/18  7:41 AM   Specimen: Nasal Mucosa; Nasopharyngeal  Result Value Ref Range Status   MRSA by PCR NEGATIVE NEGATIVE Final    Comment:        The GeneXpert MRSA Assay (FDA approved for NASAL specimens only), is one component of a  comprehensive MRSA colonization surveillance program. It is not intended to diagnose MRSA infection nor to guide or monitor treatment for MRSA infections. Performed at [facility], [place] [address], [place], [place] [address]   Culture, blood (routine x 2)     Status: None   Collection Time: 12/29/18  9:08 AM   Specimen: BLOOD RIGHT HAND  Result Value Ref Range Status   Specimen Description   Final    BLOOD RIGHT HAND Performed at [place] [address] [place], [place] [address]    Special Requests   Final    BOTTLES DRAWN AEROBIC ONLY Blood Culture adequate volume Performed at [place] [address] [place], [place] [address]    Culture   Final    NO GROWTH 5 DAYS Performed at [facility],  [address], [place], [place] [address]    Report Status 01/03/2019 FINAL  Final  Culture, blood (routine x 2)     Status: None   Collection Time: 12/29/18  9:08 AM   Specimen: BLOOD LEFT HAND  Result Value Ref Range Status   Specimen Description   Final    BLOOD LEFT HAND Performed at [place] [address] [place], [place] [address]    Special Requests   Final    BOTTLES DRAWN AEROBIC ONLY Blood Culture results may not be optimal due to an inadequate volume of blood received in culture bottles Performed at [place] [address], [place], [place] [address]    Culture   Final    NO GROWTH 5 DAYS Performed at [facility], [place] [address] [place],  [address]    Report Status 01/03/2019 FINAL  Final  Surgical PCR screen     Status: None   Collection Time: 01/03/19 12:30 AM   Specimen: Nasal Mucosa; Nasal Swab  Result Value Ref Range Status   MRSA, PCR NEGATIVE NEGATIVE Final   Staphylococcus aureus NEGATIVE NEGATIVE Final    Comment: (NOTE) The Xpert SA Assay (FDA approved for NASAL specimens in patients 79 years of age and older), is one  component of a comprehensive surveillance program. It is not intended to diagnose infection nor to guide or monitor treatment. Performed at [facility], [place] [address], [place],  [address]          Radiology Studies: No results found.      Scheduled Meds: . sodium chloride   Intravenous Once  . sodium chloride   Intravenous Once  . ALPRAZolam  0.25 mg Oral BID  . atenolol  50 mg Oral Daily  . buPROPion  150 mg Oral Daily  . Chlorhexidine Gluconate Cloth  6 each Topical Daily  . docusate sodium  100 mg Oral BID  . DULoxetine  60 mg Oral Daily  . feeding supplement (PRO-STAT SUGAR FREE 64)  30 mL Oral BID  . ferrous sulfate  325 mg Oral BID WC  . fluticasone  1 spray Each Nare Daily  . gabapentin  300 mg Oral BID  . insulin aspart  0-15 Units Subcutaneous TID WC  . insulin aspart  0-5 Units Subcutaneous QHS  . insulin aspart  4 Units Subcutaneous TID WC  . insulin glargine  12 Units Subcutaneous QHS  . lactulose  20 g Oral BID  . loratadine  10 mg Oral Daily  . mesalamine  1,000 mg Rectal QHS  . montelukast  10 mg Oral QHS  . multivitamin with minerals  1 tablet Oral Daily  . mupirocin ointment  1 application Nasal BID  . rifaximin  550 mg Oral BID   Continuous Infusions: . cefTRIAXone (ROCEPHIN)  IV 1 g (01/04/19 2140)  . lactated ringers 10 mL/hr at 01/03/19 1347  . vancomycin 1,000 mg (01/04/19 0933)     LOS: 8 days     Cordelia Poche, MD [facility] 01/05/2019, 12:17 PM  If 7PM-7AM, please contact night-coverage [email or web address]

## 2019-01-05 NOTE — Progress Notes (Signed)
CRITICAL VALUE ALERT  Critical Value:  HMG 6.7  Date & Time Notied:  10/15 @ 1150  Provider Notified: Netty  Orders Received/Actions taken:

## 2019-01-05 NOTE — Care Management Important Message (Signed)
Important Message  Patient Details IM Letter given to Cookie McGibboney RN to present to the Patient Name: Brittany Russo MRN: [id number] Date of Birth: 12/23/1945   Medicare Important Message Given:  Yes     Kerin Salen 01/05/2019, 11:58 AM

## 2019-01-06 DIAGNOSIS — E1169 Type 2 diabetes mellitus with other specified complication: Secondary | ICD-10-CM | POA: Diagnosis not present

## 2019-01-06 DIAGNOSIS — R279 Unspecified lack of coordination: Secondary | ICD-10-CM | POA: Diagnosis not present

## 2019-01-06 DIAGNOSIS — Y9289 Other specified places as the place of occurrence of the external cause: Secondary | ICD-10-CM | POA: Diagnosis not present

## 2019-01-06 DIAGNOSIS — Z20828 Contact with and (suspected) exposure to other viral communicable diseases: Secondary | ICD-10-CM | POA: Diagnosis not present

## 2019-01-06 DIAGNOSIS — R001 Bradycardia, unspecified: Secondary | ICD-10-CM | POA: Diagnosis not present

## 2019-01-06 DIAGNOSIS — F330 Major depressive disorder, recurrent, mild: Secondary | ICD-10-CM | POA: Diagnosis not present

## 2019-01-06 DIAGNOSIS — D500 Iron deficiency anemia secondary to blood loss (chronic): Secondary | ICD-10-CM | POA: Diagnosis not present

## 2019-01-06 DIAGNOSIS — Z743 Need for continuous supervision: Secondary | ICD-10-CM | POA: Diagnosis not present

## 2019-01-06 DIAGNOSIS — N178 Other acute kidney failure: Secondary | ICD-10-CM | POA: Diagnosis not present

## 2019-01-06 DIAGNOSIS — F4320 Adjustment disorder, unspecified: Secondary | ICD-10-CM | POA: Diagnosis not present

## 2019-01-06 DIAGNOSIS — Z471 Aftercare following joint replacement surgery: Secondary | ICD-10-CM | POA: Diagnosis not present

## 2019-01-06 DIAGNOSIS — B9689 Other specified bacterial agents as the cause of diseases classified elsewhere: Secondary | ICD-10-CM | POA: Diagnosis not present

## 2019-01-06 DIAGNOSIS — F064 Anxiety disorder due to known physiological condition: Secondary | ICD-10-CM | POA: Diagnosis not present

## 2019-01-06 DIAGNOSIS — R739 Hyperglycemia, unspecified: Secondary | ICD-10-CM | POA: Diagnosis not present

## 2019-01-06 DIAGNOSIS — F419 Anxiety disorder, unspecified: Secondary | ICD-10-CM | POA: Diagnosis not present

## 2019-01-06 DIAGNOSIS — E1069 Type 1 diabetes mellitus with other specified complication: Secondary | ICD-10-CM | POA: Diagnosis not present

## 2019-01-06 DIAGNOSIS — I959 Hypotension, unspecified: Secondary | ICD-10-CM | POA: Diagnosis not present

## 2019-01-06 DIAGNOSIS — M6281 Muscle weakness (generalized): Secondary | ICD-10-CM | POA: Diagnosis not present

## 2019-01-06 DIAGNOSIS — R278 Other lack of coordination: Secondary | ICD-10-CM | POA: Diagnosis not present

## 2019-01-06 DIAGNOSIS — Z4789 Encounter for other orthopedic aftercare: Secondary | ICD-10-CM | POA: Diagnosis not present

## 2019-01-06 DIAGNOSIS — N3001 Acute cystitis with hematuria: Secondary | ICD-10-CM | POA: Diagnosis not present

## 2019-01-06 DIAGNOSIS — T84092D Other mechanical complication of internal right knee prosthesis, subsequent encounter: Secondary | ICD-10-CM | POA: Diagnosis not present

## 2019-01-06 DIAGNOSIS — R2689 Other abnormalities of gait and mobility: Secondary | ICD-10-CM | POA: Diagnosis not present

## 2019-01-06 DIAGNOSIS — N390 Urinary tract infection, site not specified: Secondary | ICD-10-CM | POA: Diagnosis not present

## 2019-01-06 DIAGNOSIS — R41841 Cognitive communication deficit: Secondary | ICD-10-CM | POA: Diagnosis not present

## 2019-01-06 DIAGNOSIS — R600 Localized edema: Secondary | ICD-10-CM | POA: Diagnosis not present

## 2019-01-06 DIAGNOSIS — K760 Fatty (change of) liver, not elsewhere classified: Secondary | ICD-10-CM | POA: Diagnosis not present

## 2019-01-06 DIAGNOSIS — E1140 Type 2 diabetes mellitus with diabetic neuropathy, unspecified: Secondary | ICD-10-CM | POA: Diagnosis not present

## 2019-01-06 DIAGNOSIS — K7290 Hepatic failure, unspecified without coma: Secondary | ICD-10-CM | POA: Diagnosis not present

## 2019-01-06 DIAGNOSIS — I10 Essential (primary) hypertension: Secondary | ICD-10-CM | POA: Diagnosis not present

## 2019-01-06 DIAGNOSIS — F29 Unspecified psychosis not due to a substance or known physiological condition: Secondary | ICD-10-CM | POA: Diagnosis not present

## 2019-01-06 DIAGNOSIS — K7460 Unspecified cirrhosis of liver: Secondary | ICD-10-CM | POA: Diagnosis not present

## 2019-01-06 DIAGNOSIS — Z23 Encounter for immunization: Secondary | ICD-10-CM | POA: Diagnosis not present

## 2019-01-06 DIAGNOSIS — E875 Hyperkalemia: Secondary | ICD-10-CM | POA: Diagnosis not present

## 2019-01-06 DIAGNOSIS — G40909 Epilepsy, unspecified, not intractable, without status epilepticus: Secondary | ICD-10-CM | POA: Diagnosis not present

## 2019-01-06 DIAGNOSIS — F331 Major depressive disorder, recurrent, moderate: Secondary | ICD-10-CM | POA: Diagnosis not present

## 2019-01-06 DIAGNOSIS — E785 Hyperlipidemia, unspecified: Secondary | ICD-10-CM | POA: Diagnosis not present

## 2019-01-06 DIAGNOSIS — D696 Thrombocytopenia, unspecified: Secondary | ICD-10-CM | POA: Diagnosis not present

## 2019-01-06 DIAGNOSIS — G63 Polyneuropathy in diseases classified elsewhere: Secondary | ICD-10-CM | POA: Diagnosis not present

## 2019-01-06 DIAGNOSIS — Y792 Prosthetic and other implants, materials and accessory orthopedic devices associated with adverse incidents: Secondary | ICD-10-CM | POA: Diagnosis present

## 2019-01-06 DIAGNOSIS — N179 Acute kidney failure, unspecified: Secondary | ICD-10-CM | POA: Diagnosis not present

## 2019-01-06 DIAGNOSIS — R4181 Age-related cognitive decline: Secondary | ICD-10-CM | POA: Diagnosis not present

## 2019-01-06 DIAGNOSIS — Y842 Radiological procedure and radiotherapy as the cause of abnormal reaction of the patient, or of later complication, without mention of misadventure at the time of the procedure: Secondary | ICD-10-CM | POA: Diagnosis present

## 2019-01-06 LAB — HEMOGLOBIN AND HEMATOCRIT, BLOOD
HCT: 25 % — ABNORMAL LOW (ref 36.0–46.0)
HCT: 25.7 % — ABNORMAL LOW (ref 36.0–46.0)
Hemoglobin: 8 g/dL — ABNORMAL LOW (ref 12.0–15.0)
Hemoglobin: 8.2 g/dL — ABNORMAL LOW (ref 12.0–15.0)

## 2019-01-06 LAB — SARS CORONAVIRUS 2 (TAT 6-24 HRS): SARS Coronavirus 2: NEGATIVE

## 2019-01-06 LAB — GLUCOSE, CAPILLARY
Glucose-Capillary: 174 mg/dL — ABNORMAL HIGH (ref 70–99)
Glucose-Capillary: 228 mg/dL — ABNORMAL HIGH (ref 70–99)

## 2019-01-06 MED ORDER — INSULIN GLARGINE 100 UNIT/ML ~~LOC~~ SOLN: 20.0000 [IU] | Freq: Every day | SUBCUTANEOUS | Status: DC

## 2019-01-06 MED ORDER — INSULIN ASPART 100 UNIT/ML ~~LOC~~ SOLN: 4.0000 [IU] | Freq: Three times a day (TID) | SUBCUTANEOUS | Status: DC

## 2019-01-06 MED ORDER — FERROUS SULFATE 325 (65 FE) MG PO TABS: 325.0000 mg | ORAL_TABLET | Freq: Two times a day (BID) | ORAL | 3 refills | Status: DC

## 2019-01-06 MED ORDER — LACTULOSE 10 GM/15ML PO SOLN: 20.0000 g | Freq: Two times a day (BID) | ORAL | Status: DC

## 2019-01-06 MED ORDER — ALPRAZOLAM 0.25 MG PO TABS: 0.2500 mg | ORAL_TABLET | Freq: Two times a day (BID) | ORAL | 0 refills | Status: DC

## 2019-01-06 MED ORDER — HYDROCODONE-ACETAMINOPHEN 5-325 MG PO TABS: 1.0000 | ORAL_TABLET | Freq: Four times a day (QID) | ORAL | 0 refills | Status: DC | PRN

## 2019-01-06 MED ORDER — PRO-STAT SUGAR FREE PO LIQD: 30.0000 mL | Freq: Two times a day (BID) | ORAL | Status: DC

## 2019-01-06 MED ORDER — SODIUM CHLORIDE 0.9% FLUSH: 10.0000 mL | Freq: Two times a day (BID) | INTRAVENOUS | Status: DC

## 2019-01-06 MED ORDER — SODIUM CHLORIDE 0.9% FLUSH: 10.0000 mL | INTRAVENOUS | Status: DC | PRN

## 2019-01-06 MED ORDER — METHOCARBAMOL 500 MG PO TABS: 500.0000 mg | ORAL_TABLET | Freq: Four times a day (QID) | ORAL | 0 refills | Status: DC | PRN

## 2019-01-06 NOTE — Progress Notes (Signed)
Called Ritta Slot x2, once to voicemail and second time Network engineer stated RN to call back.

## 2019-01-06 NOTE — Progress Notes (Signed)
     Subjective: 3 Days Post-Op Procedure(s) (LRB): EXCISIONAL TOTAL KNEE ARTHROPLASTY (Right)   Patient reports pain as moderate, controlled with medication.  No reported events.  Dressing changed.  Patient follow-up in the clinic in 2 weeks.   Objective:   VITALS:   Vitals:   01/05/19 1758 01/05/19 2112  BP: (!) 124/54 (!) 118/48  Pulse: 78 76  Resp: 18 18  Temp: 98.4 F (36.9 C) 98.3 F (36.8 C)  SpO2: 98% 99%    Incision: dressing C/D/I No cellulitis present Compartment soft  LABS Recent Labs    01/04/19 0527 01/05/19 0418 01/05/19 1132 01/05/19 1249 01/05/19 1831  HGB 9.4* 8.2* 6.7* 7.0* 8.5*  HCT 30.7* 26.2* 21.0* 22.6* 26.6*  WBC 6.9 7.6 6.6  --   --   PLT 103* 71* 69*  --   --     Recent Labs    01/04/19 0527 01/05/19 0418 01/05/19 1132  NA 131* 130*  --   K 5.2* 5.2* 4.6  BUN 20 27*  --   CREATININE 1.16* 0.81  --   GLUCOSE 357* 304*  --      Assessment/Plan: 3 Days Post-Op Procedure(s) (LRB): EXCISIONAL TOTAL KNEE ARTHROPLASTY (Right) Up with therapy Discharge disposition TBD Drain removed Dressing changed Orthopaedically stable  Ortho recommendations:  Norco for pain management (Rx written).  NWB on the right leg.  Transfer with left leg  Knee immobilizer right leg on at all times  Work on UE strengthening to assist in transfers  Dressing to remain in place until follow in clinic in 2 weeks.  Dressing is waterproof and may shower with it in place.  Follow up in 2 weeks at [facility] Follow up with OLIN,Keefer Soulliere D in 2 weeks.  Contact information:  [facility] [address] [place]   PAC  01/06/2019, 8:29 AM

## 2019-01-06 NOTE — TOC Progression Note (Signed)
Transition of Care [facility]) - Progression Note    Patient Details  Name: Brittany Russo MRN: [id number] Date of Birth: [birth year]  [facility] CM/SW Contact  Purcell Mouton, RN Phone Number: 01/06/2019, 4:14 PM  Clinical Narrative:    [facility] called for 5:00 PM pick up, RN is aware.    Expected Discharge Plan: [place] Barriers to Discharge: No Barriers Identified  Expected Discharge Plan and Services Expected Discharge Plan: [place]   Discharge Planning Services: CM Consult   Living arrangements for the past 2 months: [place] Expected Discharge Date: 01/06/19                                     Social Determinants of Health (SDOH) Interventions    Readmission Risk Interventions No flowsheet data found.

## 2019-01-06 NOTE — Progress Notes (Signed)
Report given to Nia, RN at [facility].  Patient is ready for transport at this time, awaiting [facility] arrival.

## 2019-01-06 NOTE — Discharge Summary (Signed)
Physician Discharge Summary  [name] ZPH:[id number] DOB: Nov 19, 1945 DOA: 12/28/2018  PCP: Rosalee Kaufman, PA-C  Admit date: 12/28/2018 Discharge date: 01/06/2019  Admitted From: SNF Disposition: SNF  Recommendations for Outpatient Follow-up:  1. Follow up with PCP in 1 week 2. Please obtain BMP/CBC in 3-5 days 3. Orthopedic follow-up in 2 weeks 4. Please follow up on the following pending results: None  Home Health: SNF Equipment/Devices: SNF  Discharge Condition: Stable CODE STATUS: Full Code Diet recommendation: Heart healthy   Brief/Interim Summary:  Admission HPI written by Shela Leff, MD   Chief Complaint: Altered mental status  HPI: Brittany Russo is a 73 y.o. female with medical history significant of liver cirrhosis secondary to NAFLD, insulin-dependent diabetes mellitus, depression, anxiety, hypertension, hyperlipidemia, seizure disorder, recent right knee extensor mechanism repair on 11/22/2018 presenting to the hospital from her SNF for evaluation of AMS.  Patient is confused and slow to respond to questions.  She is not sure why she is here.  Oriented to self and knows it is October 2020.  Not oriented to place.  Denies fevers, chest pain, shortness of breath, cough, nausea, vomiting, abdominal pain, or dysuria.  No additional history could be obtained from her.  ED Course: Bradycardic with heart rate in the 40s.  Not hypotensive.  Afebrile and no leukocytosis.  Lactic acid 2.5.  Ammonia level 63, elevated compared to prior labs.  Hemoglobin 11.8, above baseline.  Platelet count 116,000, has chronic thrombocytopenia and platelet count is currently improved from baseline.  Sodium 131, chronically low and at baseline.  Potassium 5.5.  Bicarb 19, anion gap 10.  Blood glucose 188.  BUN 34, creatinine 1.5.  Creatinine was 0.7 on 9/13.  T bili 1.6, remainder of LFTs normal.  INR 1.2.  UA with large amount of leukocytes, greater than 50 RBCs, greater than  50 WBCs, and many bacteria on microscopic examination.  Urine culture pending.  VBG with pH 7.34.  Head CT negative for acute finding. Patient received ceftriaxone, lactulose, and 1 L normal saline bolus.  Review of Systems: All systems reviewed and apart from history of presenting illness, are negative.   Hospital course:  Acute hepatic encephalopathy Appears resolved with lactulose. Continue Lactulose 20 g BID for goal of 2-3 soft stools daily and Rifaxamin.  Citrobacter Braakii UTI Enterococcus Raffinosus UTI Associated hematuria. Patient currently on Ceftriaxone/Vancomycin. Oncology consulted earlier and recommended outpatient follow-up for possible cystoscopy. Hematuria resovled. Discontinue antibiotics today.  AKI Baseline creatinine of about 1. Peak of 1.57 with initial improvement to 0.6. Creatinine bumped back up to 1.16 after receiving IV fluids and s/p surgery. Back down again to baseline.  Bradycardia PR prolongation Bradycardia resolved. Atenolol resumed and continues to have a normal pulse.  Hyperbilirubinemia Bilirubin trended down and resolved. History of cirrhosis.  Diarrhea Partly secondary to hepatic encephalopathy treatment. Miralax discontinued.  Diabetes mellitus, insulin dependent Hemoglobin A1C of 6.3%. Acutely worsened blood sugar likely secondary to IV decadron given. Increased Lantus to 20 units and Novolog 4 units with meals.  Failed knee replacement Patient underwent excisional total knee arthroplasty on 10/13.  Hyperkalemia Mild. In setting of recurrent acute kidney injury  Hyponatremia Stable. Asymptomatic. Likely related to some fluid retention. May need to start diuresis.  Pancytopenia WBC and platelets improved. Hemoglobin trended down significantly.  Acute blood loss anemia In setting of recent surgery. Transfused 1 unit of PRBC. Hemoglobin stable at 8.2. Repeat CBC in 3-5 days.  Obesity Body mass index is  31.65  kg/m.  Discharge Diagnoses:  Principal Problem:   Hepatic encephalopathy ([place]) Active Problems:   AKI (acute kidney injury) ([place])   UTI (urinary tract infection)   Bradycardia   Hyperkalemia   Hyperglycemia   Failed total right knee replacement [facility])    Discharge Instructions  Discharge Instructions    Non weight bearing   Complete by: As directed    Laterality: right   Extremity: Lower     Allergies as of 01/06/2019      Reactions   Asa [aspirin] Other (See Comments)   Pt not allergic but cannot take due to bleeding    Other Other (See Comments)   Any jewelry metal-hives and itching       Medication List    STOP taking these medications   Cartia XT 180 MG 24 hr capsule Generic drug: diltiazem   meloxicam 7.5 MG tablet Commonly known as: MOBIC   polyethylene glycol 17 g packet Commonly known as: MIRALAX / GLYCOLAX   spironolactone 50 MG tablet Commonly known as: ALDACTONE   sulfamethoxazole-trimethoprim 800-160 MG tablet Commonly known as: BACTRIM DS     TAKE these medications   ALPRAZolam 0.25 MG tablet Commonly known as: XANAX Take 1 tablet (0.25 mg total) by mouth 2 (two) times daily.   atenolol 50 MG tablet Commonly known as: TENORMIN Take 50 mg by mouth daily.   atorvastatin 80 MG tablet Commonly known as: LIPITOR Take 80 mg by mouth daily at 8 pm.   buPROPion 150 MG 24 hr tablet Commonly known as: WELLBUTRIN XL Take 150 mg by mouth daily.   docusate sodium 100 MG capsule Commonly known as: Colace Take 1 capsule (100 mg total) by mouth 2 (two) times daily.   DULoxetine 60 MG capsule Commonly known as: CYMBALTA Take 60 mg by mouth daily.   feeding supplement (PRO-STAT SUGAR FREE 64) Liqd Take 30 mLs by mouth 2 (two) times daily.   ferrous sulfate 325 (65 FE) MG tablet Take 1 tablet (325 mg total) by mouth 2 (two) times daily with a meal.   fluticasone 50 MCG/ACT nasal spray Commonly known as: FLONASE Place 1 spray into both  nostrils daily.   gabapentin 300 MG capsule Commonly known as: NEURONTIN Take 300 mg by mouth 2 (two) times daily.   glipiZIDE 5 MG tablet Commonly known as: GLUCOTROL Take 5 mg by mouth 2 (two) times daily.   Glucagon Emergency 1 MG injection Generic drug: glucagon Inject 1 mg into the muscle once as needed (blood sugar less than 60).   HYDROcodone-acetaminophen 5-325 MG tablet Commonly known as: Norco Take 1-2 tablets by mouth every 6 (six) hours as needed for moderate pain or severe pain.   insulin aspart 100 UNIT/ML injection Commonly known as: novoLOG Inject 4 Units into the skin 3 (three) times daily with meals.   insulin glargine 100 UNIT/ML injection Commonly known as: LANTUS Inject 0.2 mLs (20 Units total) into the skin at bedtime. What changed:   how much to take  when to take this   lactulose 10 GM/15ML solution Commonly known as: CHRONULAC Take 30 mLs (20 g total) by mouth 2 (two) times daily.   loratadine 10 MG tablet Commonly known as: CLARITIN Take 10 mg by mouth daily.   methocarbamol 500 MG tablet Commonly known as: Robaxin Take 1 tablet (500 mg total) by mouth every 6 (six) hours as needed for muscle spasms.   montelukast 10 MG tablet Commonly known as: SINGULAIR Take 10 mg  by mouth at bedtime.   multivitamin with minerals Tabs tablet Take 1 tablet by mouth daily.   ondansetron 4 MG tablet Commonly known as: ZOFRAN Take 1 tablet (4 mg total) by mouth every 6 (six) hours as needed for nausea.   potassium chloride 10 MEQ tablet Commonly known as: KLOR-CON Take 10 mEq by mouth daily.   rifaximin 550 MG Tabs tablet Commonly known as: XIFAXAN Take 550 mg by mouth 2 (two) times daily.   sitaGLIPtin-metformin 50-500 MG tablet Commonly known as: JANUMET Take 1 tablet by mouth 2 (two) times daily with a meal.            Discharge Care Instructions  (From admission, onward)         Start     Ordered   01/06/19 0000  Non weight  bearing    Question Answer Comment  Laterality right   Extremity Lower      01/06/19 0915          Allergies  Allergen Reactions   Asa [Aspirin] Other (See Comments)    Pt not allergic but cannot take due to bleeding    Other Other (See Comments)    Any jewelry metal-hives and itching     Consultations:  Orthopedic surgery   Procedures/Studies: Ct Head Wo Contrast  Result Date: 12/28/2018 CLINICAL DATA:  Confusion.  Hallucinations. EXAM: CT HEAD WITHOUT CONTRAST TECHNIQUE: Contiguous axial images were obtained from the base of the skull through the vertex without intravenous contrast. COMPARISON:  Head CT 09/25/2018 FINDINGS: Brain: No acute intracranial hemorrhage. No focal mass lesion. No CT evidence of acute infarction. No midline shift or mass effect. No hydrocephalus. Basilar cisterns are patent. There are minimal periventricular and subcortical white matter hypodensities. Minimal generalized cortical atrophy. Vascular: No hyperdense vessel or unexpected calcification. Skull: Normal. Negative for fracture or focal lesion. Sinuses/Orbits: Small volume fluid in the maxillary sinus. Mastoid air cells are clear. Orbits are clear. Other: None. IMPRESSION: 1. No acute intracranial findings. 2. Minimal cerebral atrophy and white matter microvascular disease. 3. Chronic maxillary sinusitis. Electronically Signed   By: Suzy Bouchard M.D.   On: 12/28/2018 18:25      10/13: Excisional total knee arthroplasty   Subjective: No issues. Feels well. Leg is slightly painful but manageable.  Discharge Exam: Vitals:   01/05/19 2112 01/06/19 1314  BP: (!) 118/48 (!) 119/51  Pulse: 76 69  Resp: 18 19  Temp: 98.3 F (36.8 C) 97.9 F (36.6 C)  SpO2: 99% 96%   Vitals:   01/05/19 1518 01/05/19 1758 01/05/19 2112 01/06/19 1314  BP: (!) 112/50 (!) 124/54 (!) 118/48 (!) 119/51  Pulse: 76 78 76 69  Resp: 18 18 18 19   Temp: 98.2 F (36.8 C) 98.4 F (36.9 C) 98.3 F (36.8 C) 97.9  F (36.6 C)  TempSrc: Oral Oral Oral Oral  SpO2: 97% 98% 99% 96%  Weight:      Height:        General: Pt is alert, awake, not in acute distress Cardiovascular: RRR, S1/S2 +, no rubs, no gallops Respiratory: CTA bilaterally, no wheezing, no rhonchi Abdominal: Soft, NT, ND, bowel sounds + Extremities: Some RLE edema, no cyanosis    The results of significant diagnostics from this hospitalization (including imaging, microbiology, ancillary and laboratory) are listed below for reference.     Microbiology: Recent Results (from the past 240 hour(s))  Urine culture     Status: Abnormal   Collection Time: 12/28/18  5:43 PM  Specimen: Urine, Random  Result Value Ref Range Status   Specimen Description   Final    URINE, RANDOM Performed at [place] [address] [place], [place] [address]    Special Requests   Final    NONE Performed at [facility], [place] [address] [place], [place] [address]    Culture (A)  Final    20,000 COLONIES/mL CITROBACTER BRAAKII >=100,000 COLONIES/mL ENTEROCOCCUS RAFFINOSUS    Report Status 12/31/2018 FINAL  Final   Organism ID, Bacteria CITROBACTER BRAAKII (A)  Final   Organism ID, Bacteria ENTEROCOCCUS RAFFINOSUS (A)  Final      Susceptibility   Citrobacter braakii - MIC*    CEFAZOLIN >=64 RESISTANT Resistant     CEFTRIAXONE <=1 SENSITIVE Sensitive     CIPROFLOXACIN <=0.25 SENSITIVE Sensitive     GENTAMICIN <=1 SENSITIVE Sensitive     IMIPENEM 0.5 SENSITIVE Sensitive     NITROFURANTOIN 256 RESISTANT Resistant     TRIMETH/SULFA <=20 SENSITIVE Sensitive     PIP/TAZO <=4 SENSITIVE Sensitive     * 20,000 COLONIES/mL CITROBACTER BRAAKII   Enterococcus raffinosus - MIC*    AMPICILLIN 16 RESISTANT Resistant     LEVOFLOXACIN 4 INTERMEDIATE Intermediate     NITROFURANTOIN 32 SENSITIVE Sensitive     VANCOMYCIN <=0.5 SENSITIVE Sensitive     * >=100,000 COLONIES/mL ENTEROCOCCUS RAFFINOSUS  SARS CORONAVIRUS 2 (TAT 6-24  HRS) Nasopharyngeal Nasopharyngeal Swab     Status: None   Collection Time: 12/28/18 10:53 PM   Specimen: Nasopharyngeal Swab  Result Value Ref Range Status   SARS Coronavirus 2 NEGATIVE NEGATIVE Final    Comment: (NOTE) SARS-CoV-2 target nucleic acids are NOT DETECTED. The SARS-CoV-2 RNA is generally detectable in upper and lower respiratory specimens during the acute phase of infection. Negative results do not preclude SARS-CoV-2 infection, do not rule out co-infections with other pathogens, and should not be used as the sole basis for treatment or other patient management decisions. Negative results must be combined with clinical observations, patient history, and epidemiological information. The expected result is Negative. Fact Sheet for Patients: SugarRoll.be Fact Sheet for Healthcare Providers: https://www.woods-mathews.com/ This test is not yet approved or cleared by the [place] FDA and  has been authorized for detection and/or diagnosis of SARS-CoV-2 by FDA under an Emergency Use Authorization (EUA). This EUA will remain  in effect (meaning this test can be used) for the duration of the COVID-19 declaration under Section 56 4(b)(1) of the Act, 21 U.S.C. section 360bbb-3(b)(1), unless the authorization is terminated or revoked sooner. Performed at [facility], [place] [address] [place], [place] [address]   MRSA PCR Screening     Status: None   Collection Time: 12/29/18  7:41 AM   Specimen: Nasal Mucosa; Nasopharyngeal  Result Value Ref Range Status   MRSA by PCR NEGATIVE NEGATIVE Final    Comment:        The GeneXpert MRSA Assay (FDA approved for NASAL specimens only), is one component of a comprehensive MRSA colonization surveillance program. It is not intended to diagnose MRSA infection nor to guide or monitor treatment for MRSA infections. Performed at [facility], [place] [address]  [place], [place] [address]   Culture, blood (routine x 2)     Status: None   Collection Time: 12/29/18  9:08 AM   Specimen: BLOOD RIGHT HAND  Result Value Ref Range Status   Specimen Description   Final    BLOOD RIGHT HAND  Performed at [facility], [place] [address] [place], [place] [address]    Special Requests   Final    BOTTLES DRAWN AEROBIC ONLY Blood Culture adequate volume Performed at [place] [address], [place], [place] [address]    Culture   Final    NO GROWTH 5 DAYS Performed at [facility], [place] [address] [place], [place] [address]    Report Status 01/03/2019 FINAL  Final  Culture, blood (routine x 2)     Status: None   Collection Time: 12/29/18  9:08 AM   Specimen: BLOOD LEFT HAND  Result Value Ref Range Status   Specimen Description   Final    BLOOD LEFT HAND Performed at [place] [address] [place], [place] [address]    Special Requests   Final    BOTTLES DRAWN AEROBIC ONLY Blood Culture results may not be optimal due to an inadequate volume of blood received in culture bottles Performed at [place] [address] [place], [place] [address]    Culture   Final    NO GROWTH 5 DAYS Performed at [facility], [place] [address] [place], [place] [address]    Report Status 01/03/2019 FINAL  Final  Surgical PCR screen     Status: None   Collection Time: 01/03/19 12:30 AM   Specimen: Nasal Mucosa; Nasal Swab  Result Value Ref Range Status   MRSA, PCR NEGATIVE NEGATIVE Final   Staphylococcus aureus NEGATIVE NEGATIVE Final    Comment: (NOTE) The Xpert SA Assay (FDA approved for NASAL specimens in patients 19 years of age and older), is one component of a comprehensive surveillance program. It is not intended to diagnose infection nor to guide or monitor treatment. Performed at [facility], [place] [address] [place], [place] [address]   SARS  CORONAVIRUS 2 (TAT 6-24 HRS) Nasopharyngeal Nasopharyngeal Swab     Status: None   Collection Time: 01/05/19  3:25 PM   Specimen: Nasopharyngeal Swab  Result Value Ref Range Status   SARS Coronavirus 2 NEGATIVE NEGATIVE Final    Comment: (NOTE) SARS-CoV-2 target nucleic acids are NOT DETECTED. The SARS-CoV-2 RNA is generally detectable in upper and lower respiratory specimens during the acute phase of infection. Negative results do not preclude SARS-CoV-2 infection, do not rule out co-infections with other pathogens, and should not be used as the sole basis for treatment or other patient management decisions. Negative results must be combined with clinical observations, patient history, and epidemiological information. The expected result is Negative. Fact Sheet for Patients: SugarRoll.be Fact Sheet for Healthcare Providers: https://www.woods-mathews.com/ This test is not yet approved or cleared by the [place] FDA and  has been authorized for detection and/or diagnosis of SARS-CoV-2 by FDA under an Emergency Use Authorization (EUA). This EUA will remain  in effect (meaning this test can be used) for the duration of the COVID-19 declaration under Section 56 4(b)(1) of the Act, 21 U.S.C. section 360bbb-3(b)(1), unless the authorization is terminated or revoked sooner. Performed at [facility], [place] [address] [place], [place] [address]      Labs: BNP (last 3 results) No results for input(s): BNP in the last 8760 hours. Basic Metabolic Panel: Recent Labs  Lab 12/31/18 0520 01/01/19 0420 01/02/19 0449 01/03/19 0454 01/04/19 0527 01/05/19 0418 01/05/19 1132  NA 130* 132* 135 133* 131* 130*  --   K 3.9 3.6 4.3 4.3 5.2* 5.2*  4.6  CL 99 98 102 101 98 101  --   CO2 21* 25 24 24  21* 21*  --   GLUCOSE 209* 221* 232* 164* 357* 304*  --   BUN 15 9 10 8 20  27*  --   CREATININE 1.04* 0.85 0.68 0.61 1.16* 0.81  --   CALCIUM 8.6*  8.3* 8.7* 8.8* 8.4* 8.3*  --   MG 1.6* 1.9 1.8 1.9 1.8  --   --   PHOS 4.2 4.0 3.9 3.7 5.7*  --   --    Liver Function Tests: Recent Labs  Lab 12/31/18 0520 01/01/19 0420 01/02/19 0449 01/03/19 0454 01/04/19 0527  AST 36 34 40 53* 52*  ALT 31 29 34 44 40  ALKPHOS 88 78 85 105 97  BILITOT 1.2 1.0 1.1 1.2 1.1  PROT 5.4* 4.9* 5.1* 5.7* 5.4*  ALBUMIN 2.9* 2.6* 2.7* 2.9* 2.7*   No results for input(s): LIPASE, AMYLASE in the last 168 hours. Recent Labs  Lab 12/31/18 0520 01/01/19 0420 01/03/19 0454  AMMONIA 61* 38* 34   CBC: Recent Labs  Lab 12/31/18 0520 01/01/19 0420 01/02/19 0449 01/03/19 0454 01/04/19 0527 01/05/19 0418 01/05/19 1132 01/05/19 1249 01/05/19 1831 01/06/19 1028 01/06/19 1518  WBC 3.0* 2.8* 2.8* 3.1* 6.9 7.6 6.6  --   --   --   --   NEUTROABS 1.9 1.6* 1.6* 1.6* 5.6  --   --   --   --   --   --   HGB 9.7* 8.7* 9.3* 10.1* 9.4* 8.2* 6.7* 7.0* 8.5* 8.0* 8.2*  HCT 30.1* 26.7* 29.0* 31.9* 30.7* 26.2* 21.0* 22.6* 26.6* 25.0* 25.7*  MCV 94.4 95.0 96.7 97.0 100.3* 100.4* 99.5  --   --   --   --   PLT 45* 38* 39* 76* 103* 71* 69*  --   --   --   --    Cardiac Enzymes: No results for input(s): CKTOTAL, CKMB, CKMBINDEX, TROPONINI in the last 168 hours. BNP: Invalid input(s): POCBNP CBG: Recent Labs  Lab 01/05/19 1257 01/05/19 1700 01/05/19 2110 01/06/19 0738 01/06/19 1137  GLUCAP 171* 170* 234* 174* 228*   D-Dimer No results for input(s): DDIMER in the last 72 hours. Hgb A1c No results for input(s): HGBA1C in the last 72 hours. Lipid Profile No results for input(s): CHOL, HDL, LDLCALC, TRIG, CHOLHDL, LDLDIRECT in the last 72 hours. Thyroid function studies No results for input(s): TSH, T4TOTAL, T3FREE, THYROIDAB in the last 72 hours.  Invalid input(s): FREET3 Anemia work up No results for input(s): VITAMINB12, FOLATE, FERRITIN, TIBC, IRON, RETICCTPCT in the last 72 hours. Urinalysis    Component Value Date/Time   COLORURINE BROWN (A)  12/28/2018 1743   APPEARANCEUR CLOUDY (A) 12/28/2018 1743   LABSPEC 1.016 12/28/2018 1743   PHURINE 5.0 12/28/2018 1743   GLUCOSEU NEGATIVE 12/28/2018 1743   HGBUR LARGE (A) 12/28/2018 1743   BILIRUBINUR NEGATIVE 12/28/2018 1743   BILIRUBINUR NEGATIVE 03/06/2013 1100   KETONESUR NEGATIVE 12/28/2018 1743   PROTEINUR 100 (A) 12/28/2018 1743   UROBILINOGEN negative 03/06/2013 1100   NITRITE NEGATIVE 12/28/2018 1743   LEUKOCYTESUR MODERATE (A) 12/28/2018 1743   Sepsis Labs Invalid input(s): PROCALCITONIN,  WBC,  LACTICIDVEN Microbiology Recent Results (from the past 240 hour(s))  Urine culture     Status: Abnormal   Collection Time: 12/28/18  5:43 PM   Specimen: Urine, Random  Result Value Ref Range Status   Specimen Description   Final    URINE, RANDOM Performed  at [facility], [place] [address], [place], [place] [address]    Special Requests   Final    NONE Performed at [facility], [place] [address] [place], [place] [address]    Culture (A)  Final    20,000 COLONIES/mL CITROBACTER BRAAKII >=100,000 COLONIES/mL ENTEROCOCCUS RAFFINOSUS    Report Status 12/31/2018 FINAL  Final   Organism ID, Bacteria CITROBACTER BRAAKII (A)  Final   Organism ID, Bacteria ENTEROCOCCUS RAFFINOSUS (A)  Final      Susceptibility   Citrobacter braakii - MIC*    CEFAZOLIN >=64 RESISTANT Resistant     CEFTRIAXONE <=1 SENSITIVE Sensitive     CIPROFLOXACIN <=0.25 SENSITIVE Sensitive     GENTAMICIN <=1 SENSITIVE Sensitive     IMIPENEM 0.5 SENSITIVE Sensitive     NITROFURANTOIN 256 RESISTANT Resistant     TRIMETH/SULFA <=20 SENSITIVE Sensitive     PIP/TAZO <=4 SENSITIVE Sensitive     * 20,000 COLONIES/mL CITROBACTER BRAAKII   Enterococcus raffinosus - MIC*    AMPICILLIN 16 RESISTANT Resistant     LEVOFLOXACIN 4 INTERMEDIATE Intermediate     NITROFURANTOIN 32 SENSITIVE Sensitive     VANCOMYCIN <=0.5 SENSITIVE Sensitive     * >=100,000 COLONIES/mL ENTEROCOCCUS RAFFINOSUS    SARS CORONAVIRUS 2 (TAT 6-24 HRS) Nasopharyngeal Nasopharyngeal Swab     Status: None   Collection Time: 12/28/18 10:53 PM   Specimen: Nasopharyngeal Swab  Result Value Ref Range Status   SARS Coronavirus 2 NEGATIVE NEGATIVE Final    Comment: (NOTE) SARS-CoV-2 target nucleic acids are NOT DETECTED. The SARS-CoV-2 RNA is generally detectable in upper and lower respiratory specimens during the acute phase of infection. Negative results do not preclude SARS-CoV-2 infection, do not rule out co-infections with other pathogens, and should not be used as the sole basis for treatment or other patient management decisions. Negative results must be combined with clinical observations, patient history, and epidemiological information. The expected result is Negative. Fact Sheet for Patients: SugarRoll.be Fact Sheet for Healthcare Providers: https://www.woods-mathews.com/ This test is not yet approved or cleared by the [place] FDA and  has been authorized for detection and/or diagnosis of SARS-CoV-2 by FDA under an Emergency Use Authorization (EUA). This EUA will remain  in effect (meaning this test can be used) for the duration of the COVID-19 declaration under Section 56 4(b)(1) of the Act, 21 U.S.C. section 360bbb-3(b)(1), unless the authorization is terminated or revoked sooner. Performed at [facility], [place] [address] [place], [place] [address]   MRSA PCR Screening     Status: None   Collection Time: 12/29/18  7:41 AM   Specimen: Nasal Mucosa; Nasopharyngeal  Result Value Ref Range Status   MRSA by PCR NEGATIVE NEGATIVE Final    Comment:        The GeneXpert MRSA Assay (FDA approved for NASAL specimens only), is one component of a comprehensive MRSA colonization surveillance program. It is not intended to diagnose MRSA infection nor to guide or monitor treatment for MRSA infections. Performed at [facility], [place] [address] [place], [place] [address]   Culture, blood (routine x 2)     Status: None   Collection Time: 12/29/18  9:08 AM   Specimen: BLOOD RIGHT HAND  Result Value Ref Range Status   Specimen Description   Final    BLOOD RIGHT HAND Performed at [place] [address], [place], [place] [address]    Special Requests   Final  BOTTLES DRAWN AEROBIC ONLY Blood Culture adequate volume Performed at [place] [address], [place], [place] [address]    Culture   Final    NO GROWTH 5 DAYS Performed at [facility], [place] [address], [place], [place] [address]    Report Status 01/03/2019 FINAL  Final  Culture, blood (routine x 2)     Status: None   Collection Time: 12/29/18  9:08 AM   Specimen: BLOOD LEFT HAND  Result Value Ref Range Status   Specimen Description   Final    BLOOD LEFT HAND Performed at [place] [address], [place], [place] [address]    Special Requests   Final    BOTTLES DRAWN AEROBIC ONLY Blood Culture results may not be optimal due to an inadequate volume of blood received in culture bottles Performed at [place] [address], [place], [place] [address]    Culture   Final    NO GROWTH 5 DAYS Performed at [facility], [place] [address] [place], [place] [address]    Report Status 01/03/2019 FINAL  Final  Surgical PCR screen     Status: None   Collection Time: 01/03/19 12:30 AM   Specimen: Nasal Mucosa; Nasal Swab  Result Value Ref Range Status   MRSA, PCR NEGATIVE NEGATIVE Final   Staphylococcus aureus NEGATIVE NEGATIVE Final    Comment: (NOTE) The Xpert SA Assay (FDA approved for NASAL specimens in patients 54 years of age and older), is one component of a comprehensive surveillance program. It is not intended to diagnose infection nor to guide or monitor treatment. Performed at [facility], [place] [address], [place], [place] [address]   SARS CORONAVIRUS 2 (TAT 6-24 HRS) Nasopharyngeal Nasopharyngeal Swab     Status: None   Collection Time: 01/05/19  3:25 PM   Specimen: Nasopharyngeal Swab  Result Value Ref Range Status   SARS Coronavirus 2 NEGATIVE NEGATIVE Final    Comment: (NOTE) SARS-CoV-2 target nucleic acids are NOT DETECTED. The SARS-CoV-2 RNA is generally detectable in upper and lower respiratory specimens during the acute phase of infection. Negative results do not preclude SARS-CoV-2 infection, do not rule out co-infections with other pathogens, and should not be used as the sole basis for treatment or other patient management decisions. Negative results must be combined with clinical observations, patient history, and epidemiological information. The expected result is Negative. Fact Sheet for Patients: SugarRoll.be Fact Sheet for Healthcare Providers: https://www.woods-mathews.com/ This test is not yet approved or cleared by the [place] FDA and  has been authorized for detection and/or diagnosis of SARS-CoV-2 by FDA under an Emergency Use Authorization (EUA). This EUA will remain  in effect (meaning this test can be used) for the duration of the COVID-19 declaration under Section 56 4(b)(1) of the Act, 21 U.S.C. section 360bbb-3(b)(1), unless the authorization is terminated or revoked sooner. Performed at [facility], [place] [address], [place], [place] [address]      Time coordinating discharge: 35 minutes  SIGNED:   Cordelia Poche, MD [facility] 01/06/2019, 3:31 PM

## 2019-01-06 NOTE — Discharge Instructions (Addendum)
Ortho recommendations:  [place] for pain management  NWB on the right leg.  Transfer with left leg  Knee immobilizer right leg on at all times, pads to prevent skin breakdown as needed  Work on upper extremity strengthening to assist in transfers  Dressing to remain in place until follow in clinic in 2 weeks.  If dressing become saturated, place with gauze and tape and keep the area dry and clean  Dressing is waterproof and may shower with it in place.  Follow up in 2 weeks at [facility]. Follow up with OLIN,Mylie Mccurley D in 2 weeks.  Contact information:  [facility] [address] [place] [id number]    _______________________________________________________________________________________________________

## 2019-01-07 LAB — BPAM RBC
Blood Product Expiration Date: 202011052359
ISSUE DATE / TIME: 202010151449
Unit Type and Rh: 6200

## 2019-01-07 LAB — TYPE AND SCREEN
ABO/RH(D): A POS
Antibody Screen: NEGATIVE
Unit division: 0

## 2019-01-10 DIAGNOSIS — K760 Fatty (change of) liver, not elsewhere classified: Secondary | ICD-10-CM | POA: Diagnosis not present

## 2019-01-10 DIAGNOSIS — K7290 Hepatic failure, unspecified without coma: Secondary | ICD-10-CM | POA: Diagnosis not present

## 2019-01-10 DIAGNOSIS — N3001 Acute cystitis with hematuria: Secondary | ICD-10-CM | POA: Diagnosis not present

## 2019-01-10 DIAGNOSIS — T84092D Other mechanical complication of internal right knee prosthesis, subsequent encounter: Secondary | ICD-10-CM | POA: Diagnosis not present

## 2019-01-12 DIAGNOSIS — N3001 Acute cystitis with hematuria: Secondary | ICD-10-CM | POA: Diagnosis not present

## 2019-01-12 DIAGNOSIS — T84092D Other mechanical complication of internal right knee prosthesis, subsequent encounter: Secondary | ICD-10-CM | POA: Diagnosis not present

## 2019-01-12 DIAGNOSIS — K7290 Hepatic failure, unspecified without coma: Secondary | ICD-10-CM | POA: Diagnosis not present

## 2019-01-12 DIAGNOSIS — E1169 Type 2 diabetes mellitus with other specified complication: Secondary | ICD-10-CM | POA: Diagnosis not present

## 2019-01-12 MED ORDER — ROPIVACAINE HCL 7.5 MG/ML IJ SOLN
INTRAMUSCULAR | Status: DC | PRN
  Administered 2019-01-03: 20 mL via PERINEURAL

## 2019-01-12 NOTE — Anesthesia Procedure Notes (Signed)
Anesthesia Regional Block: Adductor canal block   Pre-Anesthetic Checklist: ,, timeout performed, Correct Patient, Correct Site, Correct Laterality, Correct Procedure, Correct Position, site marked, Risks and benefits discussed,  Surgical consent,  Pre-op evaluation,  At surgeon's request and post-op pain management  Laterality: Right and Lower  Prep: chloraprep       Needles:  Injection technique: Single-shot     Needle Length: 9cm  Needle Gauge: 22     Additional Needles: Arrow StimuQuik ECHO Echogenic Stimulating PNB Needle  Procedures:,,,, ultrasound used (permanent image in chart),,,,  Narrative:  Start time: 01/03/2019 2:38 PM End time: 01/03/2019 2:42 PM Injection made incrementally with aspirations every 5 mL.  Performed by: Personally  Anesthesiologist: Oleta Mouse, MD

## 2019-01-12 NOTE — Anesthesia Postprocedure Evaluation (Signed)
Anesthesia Post Note  Patient: Brittany Russo  Procedure(s) Performed: EXCISIONAL TOTAL KNEE ARTHROPLASTY (Right Knee)     Patient location during evaluation: PACU Anesthesia Type: Regional and General Level of consciousness: awake and alert Pain management: pain level controlled Vital Signs Assessment: post-procedure vital signs reviewed and stable Respiratory status: spontaneous breathing, nonlabored ventilation, respiratory function stable and patient connected to nasal cannula oxygen Cardiovascular status: blood pressure returned to baseline and stable Postop Assessment: no apparent nausea or vomiting Anesthetic complications: no    Last Vitals:  Vitals:   01/05/19 2112 01/06/19 1314  BP: (!) 118/48 (!) 119/51  Pulse: 76 69  Resp: 18 19  Temp: 36.8 C 36.6 C  SpO2: 99% 96%    Last Pain:  Vitals:   01/06/19 1724  TempSrc:   PainSc: 3                  Luke Falero

## 2019-01-13 DIAGNOSIS — D696 Thrombocytopenia, unspecified: Secondary | ICD-10-CM | POA: Diagnosis not present

## 2019-01-13 DIAGNOSIS — K7460 Unspecified cirrhosis of liver: Secondary | ICD-10-CM | POA: Diagnosis not present

## 2019-01-13 DIAGNOSIS — N179 Acute kidney failure, unspecified: Secondary | ICD-10-CM | POA: Diagnosis not present

## 2019-01-13 DIAGNOSIS — K7290 Hepatic failure, unspecified without coma: Secondary | ICD-10-CM | POA: Diagnosis not present

## 2019-01-13 DIAGNOSIS — E1140 Type 2 diabetes mellitus with diabetic neuropathy, unspecified: Secondary | ICD-10-CM | POA: Diagnosis not present

## 2019-01-13 DIAGNOSIS — N390 Urinary tract infection, site not specified: Secondary | ICD-10-CM | POA: Diagnosis not present

## 2019-01-14 DIAGNOSIS — F419 Anxiety disorder, unspecified: Secondary | ICD-10-CM | POA: Diagnosis not present

## 2019-01-14 DIAGNOSIS — R001 Bradycardia, unspecified: Secondary | ICD-10-CM | POA: Diagnosis not present

## 2019-01-14 DIAGNOSIS — G63 Polyneuropathy in diseases classified elsewhere: Secondary | ICD-10-CM | POA: Diagnosis not present

## 2019-01-14 DIAGNOSIS — T84092D Other mechanical complication of internal right knee prosthesis, subsequent encounter: Secondary | ICD-10-CM | POA: Diagnosis not present

## 2019-01-14 DIAGNOSIS — K7290 Hepatic failure, unspecified without coma: Secondary | ICD-10-CM | POA: Diagnosis not present

## 2019-01-14 DIAGNOSIS — K760 Fatty (change of) liver, not elsewhere classified: Secondary | ICD-10-CM | POA: Diagnosis not present

## 2019-01-14 DIAGNOSIS — I10 Essential (primary) hypertension: Secondary | ICD-10-CM | POA: Diagnosis not present

## 2019-01-15 NOTE — Progress Notes (Deleted)
Subjective:    Patient ID: Brittany Russo, female    DOB: 1946/03/21, 73 y.o.   MRN: [id number]  HPI Brittany Russo is a 73 y.o. female with a significant past medical history including NASH cirrhosis, endometrial cancer treated with hysterectomy 1996 then radiation for recurrence 2003, developed radiation proctitis, vaginal cancer, DM II, seizure disorder. She is S/P right total knee replacement 09/22/2018. She was discharged home 7/3 and fell. She was readmitted to [facility] 7/4 with and a fractured right distal femur, dislocation of the femoral component of the knee replacement. She underwent surgical revision  on 7/7. During this hospital admission, she developed hepatic encephalopathy, ammonia level was 66 which improved with lactulose. CT of head was negative. She was discharged to a SNF 09/30/2018.  She developed hematochezia and she presented to [facility]  ED 7/20. Hg 8 down to 7.2. She received 1 unit of PRBCs. INR 1.5. It was unclear if she was taking Eliquis for DVT prophylaxis post ortho surgery. She had AKI which improved. She developed ascites and edema. She underwent a paracentesis 7/23 and 850cc peritoneal fluid was removed without evidence of SBP. She was started on Lasix 31m po QD and Spironolactone 577mQD. Her hematochezia resolved after receiving Hydrocortisone 2568muppositories. A flexible sigmoidoscopy was not done.  She was seen in office on 10/26/2018 with nausea, abdominal pain and rectal bleeding. A surveillance EGD and a flexible sigmoidoscopy were ordered but not completed as she required another right knee surgery.     right total knee replacement that was complicated by early postoperative fall and significantly comminuted displaced distal femur fracture.  This required revision to a distal  femoral replacing total knee arthroplasty.  In early postoperative period, she was seen back in the office and noted to have concerns for patella alta as well as palpable implant  through her skin.  I was worried about extensor mechanism disruption but  also the proximity of the components to skin and eroding weight through the skin, results of infection.   S/P exploratory surgery of her extensor mechanism and attempted repair 11/24/2018.  [facility] She was evaluated by Dr. MagSarina Sere to GI bleed.   CBC Latest Ref Rng & Units 01/06/2019 01/06/2019 01/05/2019  WBC 4.0 - 10.5 K/uL - - -  Hemoglobin 12.0 - 15.0 g/dL 8.2(L) 8.0(L) 8.5(L)  Hematocrit 36.0 - 46.0 % 25.7(L) 25.0(L) 26.6(L)  Platelets 150 - 400 K/uL - - -    EGD 12/30/2018 by Dr. [facility]arene [facility] was normal. A small hiatal hernia was present. A single diminutive semi-sessile polyp was found in the cardia. Diffuse minimal inflammation characterized by congestion (edema) was found in the gastric body. The duodenal bulb, first portion of the duodenum, second portion of the duodenum and third portion of the duodenum were normal. The exam was otherwise without abnormality.  Flexible sigmoidoscopy 12/30/2018 by Dr. MarClarene Essex [facility]- Stool in the mid sigmoid colon. No blood seen coming from above - Mucosal ulceration. Treated with radiofrequency ablation. Patible with radiation damage - The examination was otherwise normal. - No specimens collected.   Abdominal sonogram 10/11/2018: cirrhotic liver, no focal liver lesion identified, ascites, right pleural effusion  Past GI procedures: Colonoscopy 06/30/2007 findings of radiation proctitis.  Patient reports Dr. BenBritta Mccreedyd a colonoscopy in 2014 and removed one polyp. Dr. BenBritta MccreedyrformedEGD in 2016 with unknown findings.    Review of Systems  Objective:   Physical Exam        Assessment & Plan:   1. NASH cirrhosis  2. Hepatic Encephalopathy   3. Radiation Proctitis

## 2019-01-16 ENCOUNTER — Telehealth: Payer: Self-pay | Admitting: Cardiology

## 2019-01-16 DIAGNOSIS — I10 Essential (primary) hypertension: Secondary | ICD-10-CM | POA: Diagnosis not present

## 2019-01-16 DIAGNOSIS — K7290 Hepatic failure, unspecified without coma: Secondary | ICD-10-CM | POA: Diagnosis not present

## 2019-01-16 DIAGNOSIS — T84092D Other mechanical complication of internal right knee prosthesis, subsequent encounter: Secondary | ICD-10-CM | POA: Diagnosis not present

## 2019-01-16 DIAGNOSIS — K760 Fatty (change of) liver, not elsewhere classified: Secondary | ICD-10-CM | POA: Diagnosis not present

## 2019-01-16 NOTE — Telephone Encounter (Signed)
Virtual Visit Pre-Appointment Phone Call  "(Name), I am calling you today to discuss your upcoming appointment. We are currently trying to limit exposure to the virus that causes COVID-19 by seeing patients at home rather than in the office."  1. "What is the BEST phone number to call the day of the visit?" - include this in appointment notes  2. Do you have or have access to (through a family member/friend) a smartphone with video capability that we can use for your visit?" a. If yes - list this number in appt notes as cell (if different from BEST phone #) and list the appointment type as a VIDEO visit in appointment notes b. If no - list the appointment type as a PHONE visit in appointment notes  3. Confirm consent - "In the setting of the current Covid19 crisis, you are scheduled for a (phone or video) visit with your provider on (date) at (time).  Just as we do with many in-office visits, in order for you to participate in this visit, we must obtain consent.  If you'd like, I can send this to your mychart (if signed up) or email for you to review.  Otherwise, I can obtain your verbal consent now.  All virtual visits are billed to your insurance company just like a normal visit would be.  By agreeing to a virtual visit, we'd like you to understand that the technology does not allow for your provider to perform an examination, and thus may limit your provider's ability to fully assess your condition. If your provider identifies any concerns that need to be evaluated in person, we will make arrangements to do so.  Finally, though the technology is pretty good, we cannot assure that it will always work on either your or our end, and in the setting of a video visit, we may have to convert it to a phone-only visit.  In either situation, we cannot ensure that we have a secure connection.  Are you willing to proceed?" STAFF: Did the patient verbally acknowledge consent to telehealth visit? Document  YES/NO here: yes  4. Advise patient to be prepared - "Two hours prior to your appointment, go ahead and check your blood pressure, pulse, oxygen saturation, and your weight (if you have the equipment to check those) and write them all down. When your visit starts, your provider will ask you for this information. If you have an Apple Watch or Kardia device, please plan to have heart rate information ready on the day of your appointment. Please have a pen and paper handy nearby the day of the visit as well."  5. Give patient instructions for MyChart download to smartphone OR [email or web address] as below if video visit (depending on what platform provider is using)  6. Inform patient they will receive a phone call 15 minutes prior to their appointment time (may be from unknown caller ID) so they should be prepared to answer    TELEPHONE CALL NOTE  [place] has been deemed a candidate for a follow-up tele-health visit to limit community exposure during the Covid-19 pandemic. I spoke with the patient via phone to ensure availability of phone/video source, confirm preferred email & phone number, and discuss instructions and expectations.  I reminded BERNADINE MELECIO to be prepared with any vital sign and/or heart rhythm information that could potentially be obtained via home monitoring, at the time of her visit. I reminded ANGELLI BARUCH to expect a phone call prior to  her visit.  Weston Anna 01/16/2019 11:06 AM   INSTRUCTIONS FOR DOWNLOADING THE MYCHART APP TO SMARTPHONE  - The patient must first make sure to have activated MyChart and know their login information - If Apple, go to [employer] and type in MyChart in the search bar and download the app. If Android, ask patient to go to [employer] and type in [place] in the search bar and download the app. The app is free but as with any other app downloads, their phone may require them to verify saved payment information or Apple/Android  password.  - The patient will need to then log into the app with their MyChart username and password, and select [facility] as their healthcare provider to link the account. When it is time for your visit, go to the MyChart app, find appointments, and click Begin Video Visit. Be sure to Select Allow for your device to access the Microphone and Camera for your visit. You will then be connected, and your provider will be with you shortly.  **If they have any issues connecting, or need assistance please contact MyChart service desk [phone])**  **If using a computer, in order to ensure the best quality for their visit they will need to use either of the following Internet Browsers: [employer], or Google Chrome**  IF USING DOXIMITY or DOXY.ME - The patient will receive a link just prior to their visit by text.     FULL LENGTH CONSENT FOR TELE-HEALTH VISIT   I hereby voluntarily request, consent and authorize [facility] and its employed or contracted physicians, physician assistants, nurse practitioners or other licensed health care professionals (the Practitioner), to provide me with telemedicine health care services (the Services") as deemed necessary by the treating Practitioner. I acknowledge and consent to receive the Services by the Practitioner via telemedicine. I understand that the telemedicine visit will involve communicating with the Practitioner through live audiovisual communication technology and the disclosure of certain medical information by electronic transmission. I acknowledge that I have been given the opportunity to request an in-person assessment or other available alternative prior to the telemedicine visit and am voluntarily participating in the telemedicine visit.  I understand that I have the right to withhold or withdraw my consent to the use of telemedicine in the course of my care at any time, without affecting my right to future care or treatment,  and that the Practitioner or I may terminate the telemedicine visit at any time. I understand that I have the right to inspect all information obtained and/or recorded in the course of the telemedicine visit and may receive copies of available information for a reasonable fee.  I understand that some of the potential risks of receiving the Services via telemedicine include:   Delay or interruption in medical evaluation due to technological equipment failure or disruption;  Information transmitted may not be sufficient (e.g. poor resolution of images) to allow for appropriate medical decision making by the Practitioner; and/or   In rare instances, security protocols could fail, causing a breach of personal health information.  Furthermore, I acknowledge that it is my responsibility to provide information about my medical history, conditions and care that is complete and accurate to the best of my ability. I acknowledge that Practitioner's advice, recommendations, and/or decision may be based on factors not within their control, such as incomplete or inaccurate data provided by me or distortions of diagnostic images or specimens that may result from electronic transmissions. I  understand that the practice of medicine is not an exact science and that Practitioner makes no warranties or guarantees regarding treatment outcomes. I acknowledge that I will receive a copy of this consent concurrently upon execution via email to the email address I last provided but may also request a printed copy by calling the office of [place].    I understand that my insurance will be billed for this visit.   I have read or had this consent read to me.  I understand the contents of this consent, which adequately explains the benefits and risks of the Services being provided via telemedicine.   I have been provided ample opportunity to ask questions regarding this consent and the Services and have had my questions  answered to my satisfaction.  I give my informed consent for the services to be provided through the use of telemedicine in my medical care  By participating in this telemedicine visit I agree to the above.

## 2019-01-17 ENCOUNTER — Ambulatory Visit (INDEPENDENT_AMBULATORY_CARE_PROVIDER_SITE_OTHER): Payer: 59 | Admitting: Nurse Practitioner

## 2019-01-19 ENCOUNTER — Telehealth: Payer: Self-pay | Admitting: Cardiology

## 2019-01-19 DIAGNOSIS — Z4789 Encounter for other orthopedic aftercare: Secondary | ICD-10-CM | POA: Diagnosis not present

## 2019-01-19 NOTE — Telephone Encounter (Signed)
Noted in appt notes

## 2019-01-19 NOTE — Telephone Encounter (Signed)
New Message    Lennette Bihari is calling and asks for the nurse to call Seth Bake tomorrow at [phone] for the telehealth visit

## 2019-01-20 ENCOUNTER — Telehealth: Payer: 59 | Admitting: Cardiology

## 2019-01-20 NOTE — Progress Notes (Unsigned)
{Choose 1 Note Type (Telehealth Visit or Telephone Visit):[phone]}   Date:  01/20/2019   ID:  Brittany Russo, DOB Oct 02, 1945, MRN [id number]  {Patient Location:[phone]::"Home"} {Provider Location:[phone]::"Home"}  PCP:  Rosalee Kaufman, PA-C  Cardiologist:  Carlyle Dolly, MD *** Electrophysiologist:  None   Evaluation Performed:  {Choose Visit [phone]::"Follow-Up Visit"}  Chief Complaint:  ***  History of Present Illness:    Brittany Russo is a 73 y.o. female with ***  1. Chest pain - occasional sharp/pressure, mid to left chest. Can occur at rest or with exertion. No other associated symptoms. Not positional. Lasts 30 sec to 1 minutes. Occurs 2-3 times a week. Ongoing about 8 years. Recently has increasedi in frequency and severity.  - history of arthritis in sternum.  - cannotrun on treadmilll due to chronic joint pains.   08/2017 nuclear stress without ischemia - symptoms improved since last visit.    2. Cough - changed from valsartan/HCTZ to candesartan by pcp due to cough. From literature review can see 3% of cough with ARB.   1. Diastolic dysfunction - 11/6220 Echo from [facility]: LVEF 55-60%, moderate LVH, grade II diastolic dysfunction - can have some chronic LE edema. Chronic SOB.     2. Mitral regurgitation - mild to moderate by recent echo - chronic SOB and LE edema, likely more related to her diastolic dysfunciton.   3. Abnormal EKG - question about long QTc from computer read EKG. By manual measure 464  4. Iron defiency - followed by hematology   6. HTN - compliant with meds - last week 130/58 she reports, though elevated today  8. Liver cirrhosis - admission 12/2018 with hepatic encephalopathy, treated with lactulose and rifaxamin -    The patient {does/does not:200015} have symptoms concerning for COVID-19 infection (fever, chills, cough, or new shortness of breath).    Past Medical History:  Diagnosis Date   . Acute and subacute hepatic failure without coma   . Allergy   . Anasarca   . Anxiety   . Arthritis   . Ascites 09/2018   no SBP on 850 cc tap 10/13/18  . Cirrhosis of liver ([place]) ~ 2004   from NAFLD.  Dx ~ 2004.  Dr Dorcas Mcmurray at [facility]/    . Depression   . Diabetes mellitus without complication ([place])    type 2  . Eczema of both hands   . Endometrial cancer ([place]) 1996, 2003   hysterectomy 1996.  recurrence 2003, treated with radiation.    . Generalized edema   . GI hemorrhage   . Headache   . 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 in [place]  . Hyperlipidemia   . Hypertension   . Muscle weakness   . Seizures ([place])    last seizure June 07, 2014   . Thrombocytopenia ([place])   . Thyroid nodule    Past Surgical History:  Procedure Laterality Date  . CATARACT EXTRACTION W/PHACO Left 06/04/2017   Procedure: CATARACT EXTRACTION PHACO AND INTRAOCULAR LENS PLACEMENT (IOC);  Surgeon: Baruch Goldmann, MD;  Location: [facility];  Service: Ophthalmology;  Laterality: Left;  CDE: 3.90  . CATARACT EXTRACTION W/PHACO Right 07/15/2017   Procedure: CATARACT EXTRACTION PHACO AND INTRAOCULAR LENS PLACEMENT (IOC);  Surgeon: Baruch Goldmann, MD;  Location: [facility];  Service: Ophthalmology;  Laterality: Right;  CDE: 7.60  . COLONOSCOPY  06/2007   in [place]. for hematochzia.  radiation proctitis  . DILATION AND CURETTAGE OF  UTERUS    . ESOPHAGOGASTRODUODENOSCOPY  2016  . ESOPHAGOGASTRODUODENOSCOPY (EGD) WITH PROPOFOL N/A 12/30/2018   Procedure: EGD;  Surgeon: Clarene Essex, MD;  Location: [facility];  Service: Endoscopy;  Laterality: N/A;  . EXCISIONAL TOTAL KNEE ARTHROPLASTY Right 01/03/2019   Procedure: EXCISIONAL TOTAL KNEE ARTHROPLASTY;  Surgeon: Paralee Cancel, MD;  Location: [facility];  Service: Orthopedics;  Laterality: Right;  . FLEXIBLE SIGMOIDOSCOPY N/A 12/30/2018   Procedure: FLEXIBLE SIGMOIDOSCOPY;  Surgeon: Clarene Essex, MD;  Location: [facility];  Service: Endoscopy;  Laterality: N/A;  . IR PARACENTESIS  10/13/2018  . TOTAL ABDOMINAL HYSTERECTOMY  1996  . TOTAL KNEE ARTHROPLASTY Right 09/22/2018   Procedure: TOTAL KNEE ARTHROPLASTY;  Surgeon: Paralee Cancel, MD;  Location: [facility];  Service: Orthopedics;  Laterality: Right;  70 mins  . TOTAL KNEE REVISION Right 09/27/2018   Procedure: TOTAL KNEE REVISION;  Surgeon: Paralee Cancel, MD;  Location: [facility];  Service: Orthopedics;  Laterality: Right;  . TOTAL KNEE REVISION Right 11/22/2018   Procedure: repair right knee extensor mechanism;  Surgeon: Paralee Cancel, MD;  Location: [facility];  Service: Orthopedics;  Laterality: Right;  90 mins     No outpatient medications have been marked as taking for the 01/20/19 encounter (Appointment) with Arnoldo Lenis, MD.     Allergies:   Asa [aspirin] and Other   Social History   Tobacco Use  . Smoking status: Never Smoker  . Smokeless tobacco: Never Used  Substance Use Topics  . Alcohol use: No  . Drug use: No     Family Hx: The patient's family history includes Cancer in her father and maternal grandmother; Diabetes in her mother; Heart disease in her father, maternal grandfather, and mother.  ROS:   Please see the history of present illness.    *** All other systems reviewed and are negative.   Prior CV studies:   The following studies were reviewed today:  08/2017 nuclear stress  T wave inversion was noted during stress in the II, III, aVF, V5 and V6 leads.  The study is normal.  This is a low risk study.  The left ventricular ejection fraction is normal (55-65%).  Labs/Other Tests and Data Reviewed:    EKG:  {EKG/Telemetry Strips Reviewed:[phone]}  Recent Labs: 12/29/2018: TSH 3.562 01/04/2019: ALT 40; Magnesium 1.8 01/05/2019: BUN 27; Creatinine, Ser 0.81; Platelets 69; Potassium 4.6; Sodium 130 01/06/2019: Hemoglobin 8.2   Recent Lipid Panel No results found for: CHOL, TRIG, HDL, CHOLHDL, LDLCALC,  LDLDIRECT  Wt Readings from Last 3 Encounters:  12/30/18 173 lb 1 oz (78.5 kg)  11/22/18 161 lb 1 oz (73.1 kg)  10/26/18 178 lb (80.7 kg)     Objective:    Vital Signs:  There were no vitals taken for this visit.   {[facility] Virtual Exam (Optional):[phone]::"VITAL SIGNS:  reviewed"}  ASSESSMENT & PLAN:    1. Chest pain - symptoms improved, recent stress test without ischemia - no further workup at this time.   1. Diastolic dysfunction - grade II diastolic dysfunction by echo - chronic SOB/DOE. Appears euvolemic today. Can consider stronger diuretic in the future if fluid becomes a bigger issue  2. Mitral regurgitation - mild to moderate by echo, conitnue to monitor at this time  3. HTN - elevated today, by her report has been at goal by recent prior checks - continue to monitor at this time, if persistently elevated will adjust meds   COVID-19 Education: The signs and symptoms of COVID-19 were discussed with  the patient and how to seek care for testing (follow up with PCP or arrange E-visit).  ***The importance of social distancing was discussed today.  Time:   Today, I have spent *** minutes with the patient with telehealth technology discussing the above problems.     Medication Adjustments/Labs and Tests Ordered: Current medicines are reviewed at length with the patient today.  Concerns regarding medicines are outlined above.   Tests Ordered: No orders of the defined types were placed in this encounter.   Medication Changes: No orders of the defined types were placed in this encounter.   Follow Up:  {F/U [phone]} {follow up:15908}  Signed, Carlyle Dolly, MD  01/20/2019 8:25 AM    [facility]

## 2019-01-23 DIAGNOSIS — I10 Essential (primary) hypertension: Secondary | ICD-10-CM | POA: Diagnosis not present

## 2019-01-23 DIAGNOSIS — K7290 Hepatic failure, unspecified without coma: Secondary | ICD-10-CM | POA: Diagnosis not present

## 2019-01-23 DIAGNOSIS — T84092D Other mechanical complication of internal right knee prosthesis, subsequent encounter: Secondary | ICD-10-CM | POA: Diagnosis not present

## 2019-01-23 DIAGNOSIS — E1169 Type 2 diabetes mellitus with other specified complication: Secondary | ICD-10-CM | POA: Diagnosis not present

## 2019-01-25 DIAGNOSIS — N178 Other acute kidney failure: Secondary | ICD-10-CM | POA: Diagnosis not present

## 2019-01-25 DIAGNOSIS — T84092D Other mechanical complication of internal right knee prosthesis, subsequent encounter: Secondary | ICD-10-CM | POA: Diagnosis not present

## 2019-01-25 DIAGNOSIS — R600 Localized edema: Secondary | ICD-10-CM | POA: Diagnosis not present

## 2019-01-25 DIAGNOSIS — K7290 Hepatic failure, unspecified without coma: Secondary | ICD-10-CM | POA: Diagnosis not present

## 2019-01-27 DIAGNOSIS — N179 Acute kidney failure, unspecified: Secondary | ICD-10-CM | POA: Diagnosis not present

## 2019-01-27 DIAGNOSIS — E1140 Type 2 diabetes mellitus with diabetic neuropathy, unspecified: Secondary | ICD-10-CM | POA: Diagnosis not present

## 2019-01-27 DIAGNOSIS — N390 Urinary tract infection, site not specified: Secondary | ICD-10-CM | POA: Diagnosis not present

## 2019-01-27 DIAGNOSIS — D696 Thrombocytopenia, unspecified: Secondary | ICD-10-CM | POA: Diagnosis not present

## 2019-01-27 DIAGNOSIS — K7290 Hepatic failure, unspecified without coma: Secondary | ICD-10-CM | POA: Diagnosis not present

## 2019-01-27 DIAGNOSIS — K7460 Unspecified cirrhosis of liver: Secondary | ICD-10-CM | POA: Diagnosis not present

## 2019-01-30 DIAGNOSIS — K7290 Hepatic failure, unspecified without coma: Secondary | ICD-10-CM | POA: Diagnosis not present

## 2019-01-30 DIAGNOSIS — N3001 Acute cystitis with hematuria: Secondary | ICD-10-CM | POA: Diagnosis not present

## 2019-01-30 DIAGNOSIS — E1169 Type 2 diabetes mellitus with other specified complication: Secondary | ICD-10-CM | POA: Diagnosis not present

## 2019-01-30 DIAGNOSIS — T84092D Other mechanical complication of internal right knee prosthesis, subsequent encounter: Secondary | ICD-10-CM | POA: Diagnosis not present

## 2019-02-02 DIAGNOSIS — N390 Urinary tract infection, site not specified: Secondary | ICD-10-CM | POA: Diagnosis not present

## 2019-02-02 DIAGNOSIS — R600 Localized edema: Secondary | ICD-10-CM | POA: Diagnosis not present

## 2019-02-02 DIAGNOSIS — T84092D Other mechanical complication of internal right knee prosthesis, subsequent encounter: Secondary | ICD-10-CM | POA: Diagnosis not present

## 2019-02-02 DIAGNOSIS — K7290 Hepatic failure, unspecified without coma: Secondary | ICD-10-CM | POA: Diagnosis not present

## 2019-02-06 DIAGNOSIS — E1140 Type 2 diabetes mellitus with diabetic neuropathy, unspecified: Secondary | ICD-10-CM | POA: Diagnosis not present

## 2019-02-06 DIAGNOSIS — K7460 Unspecified cirrhosis of liver: Secondary | ICD-10-CM | POA: Diagnosis not present

## 2019-02-06 DIAGNOSIS — Z471 Aftercare following joint replacement surgery: Secondary | ICD-10-CM | POA: Diagnosis not present

## 2019-02-06 DIAGNOSIS — N390 Urinary tract infection, site not specified: Secondary | ICD-10-CM | POA: Diagnosis not present

## 2019-02-06 DIAGNOSIS — D696 Thrombocytopenia, unspecified: Secondary | ICD-10-CM | POA: Diagnosis not present

## 2019-02-06 DIAGNOSIS — T84092D Other mechanical complication of internal right knee prosthesis, subsequent encounter: Secondary | ICD-10-CM | POA: Diagnosis not present

## 2019-02-06 DIAGNOSIS — D500 Iron deficiency anemia secondary to blood loss (chronic): Secondary | ICD-10-CM | POA: Diagnosis not present

## 2019-02-06 DIAGNOSIS — E785 Hyperlipidemia, unspecified: Secondary | ICD-10-CM | POA: Diagnosis not present

## 2019-02-06 DIAGNOSIS — G40909 Epilepsy, unspecified, not intractable, without status epilepticus: Secondary | ICD-10-CM | POA: Diagnosis not present

## 2019-02-06 DIAGNOSIS — R41841 Cognitive communication deficit: Secondary | ICD-10-CM | POA: Diagnosis not present

## 2019-02-06 DIAGNOSIS — W19XXXD Unspecified fall, subsequent encounter: Secondary | ICD-10-CM | POA: Diagnosis not present

## 2019-02-06 DIAGNOSIS — F330 Major depressive disorder, recurrent, mild: Secondary | ICD-10-CM | POA: Diagnosis not present

## 2019-02-06 DIAGNOSIS — M6281 Muscle weakness (generalized): Secondary | ICD-10-CM | POA: Diagnosis not present

## 2019-02-06 DIAGNOSIS — E7220 Disorder of urea cycle metabolism, unspecified: Secondary | ICD-10-CM | POA: Diagnosis not present

## 2019-02-06 DIAGNOSIS — F331 Major depressive disorder, recurrent, moderate: Secondary | ICD-10-CM | POA: Diagnosis not present

## 2019-02-06 DIAGNOSIS — R600 Localized edema: Secondary | ICD-10-CM | POA: Diagnosis not present

## 2019-02-06 DIAGNOSIS — Z20828 Contact with and (suspected) exposure to other viral communicable diseases: Secondary | ICD-10-CM | POA: Diagnosis not present

## 2019-02-06 DIAGNOSIS — N3001 Acute cystitis with hematuria: Secondary | ICD-10-CM | POA: Diagnosis not present

## 2019-02-06 DIAGNOSIS — F064 Anxiety disorder due to known physiological condition: Secondary | ICD-10-CM | POA: Diagnosis not present

## 2019-02-06 DIAGNOSIS — E1069 Type 1 diabetes mellitus with other specified complication: Secondary | ICD-10-CM | POA: Diagnosis not present

## 2019-02-06 DIAGNOSIS — I10 Essential (primary) hypertension: Secondary | ICD-10-CM | POA: Diagnosis not present

## 2019-02-06 DIAGNOSIS — R410 Disorientation, unspecified: Secondary | ICD-10-CM | POA: Diagnosis not present

## 2019-02-06 DIAGNOSIS — R278 Other lack of coordination: Secondary | ICD-10-CM | POA: Diagnosis not present

## 2019-02-06 DIAGNOSIS — B9689 Other specified bacterial agents as the cause of diseases classified elsewhere: Secondary | ICD-10-CM | POA: Diagnosis not present

## 2019-02-06 DIAGNOSIS — F419 Anxiety disorder, unspecified: Secondary | ICD-10-CM | POA: Diagnosis not present

## 2019-02-06 DIAGNOSIS — R319 Hematuria, unspecified: Secondary | ICD-10-CM | POA: Diagnosis not present

## 2019-02-06 DIAGNOSIS — K7290 Hepatic failure, unspecified without coma: Secondary | ICD-10-CM | POA: Diagnosis not present

## 2019-02-06 DIAGNOSIS — E7089 Other disorders of aromatic amino-acid metabolism: Secondary | ICD-10-CM | POA: Diagnosis not present

## 2019-02-06 DIAGNOSIS — K760 Fatty (change of) liver, not elsewhere classified: Secondary | ICD-10-CM | POA: Diagnosis not present

## 2019-02-06 DIAGNOSIS — R2689 Other abnormalities of gait and mobility: Secondary | ICD-10-CM | POA: Diagnosis not present

## 2019-02-07 DIAGNOSIS — N390 Urinary tract infection, site not specified: Secondary | ICD-10-CM | POA: Diagnosis not present

## 2019-02-07 DIAGNOSIS — K760 Fatty (change of) liver, not elsewhere classified: Secondary | ICD-10-CM | POA: Diagnosis not present

## 2019-02-07 DIAGNOSIS — T84092D Other mechanical complication of internal right knee prosthesis, subsequent encounter: Secondary | ICD-10-CM | POA: Diagnosis not present

## 2019-02-07 DIAGNOSIS — K7290 Hepatic failure, unspecified without coma: Secondary | ICD-10-CM | POA: Diagnosis not present

## 2019-02-12 DIAGNOSIS — N390 Urinary tract infection, site not specified: Secondary | ICD-10-CM | POA: Diagnosis not present

## 2019-02-12 DIAGNOSIS — F419 Anxiety disorder, unspecified: Secondary | ICD-10-CM | POA: Diagnosis not present

## 2019-02-12 DIAGNOSIS — K7290 Hepatic failure, unspecified without coma: Secondary | ICD-10-CM | POA: Diagnosis not present

## 2019-02-12 DIAGNOSIS — K760 Fatty (change of) liver, not elsewhere classified: Secondary | ICD-10-CM | POA: Diagnosis not present

## 2019-02-12 DIAGNOSIS — R600 Localized edema: Secondary | ICD-10-CM | POA: Diagnosis not present

## 2019-02-12 DIAGNOSIS — R319 Hematuria, unspecified: Secondary | ICD-10-CM | POA: Diagnosis not present

## 2019-02-14 DIAGNOSIS — N3001 Acute cystitis with hematuria: Secondary | ICD-10-CM | POA: Diagnosis not present

## 2019-02-14 DIAGNOSIS — K760 Fatty (change of) liver, not elsewhere classified: Secondary | ICD-10-CM | POA: Diagnosis not present

## 2019-02-14 DIAGNOSIS — T84092D Other mechanical complication of internal right knee prosthesis, subsequent encounter: Secondary | ICD-10-CM | POA: Diagnosis not present

## 2019-02-14 DIAGNOSIS — K7290 Hepatic failure, unspecified without coma: Secondary | ICD-10-CM | POA: Diagnosis not present

## 2019-02-22 DIAGNOSIS — N3001 Acute cystitis with hematuria: Secondary | ICD-10-CM | POA: Diagnosis not present

## 2019-02-22 DIAGNOSIS — T84092D Other mechanical complication of internal right knee prosthesis, subsequent encounter: Secondary | ICD-10-CM | POA: Diagnosis not present

## 2019-02-22 DIAGNOSIS — R410 Disorientation, unspecified: Secondary | ICD-10-CM | POA: Diagnosis not present

## 2019-02-22 DIAGNOSIS — K760 Fatty (change of) liver, not elsewhere classified: Secondary | ICD-10-CM | POA: Diagnosis not present

## 2019-02-24 DIAGNOSIS — D696 Thrombocytopenia, unspecified: Secondary | ICD-10-CM | POA: Diagnosis not present

## 2019-02-24 DIAGNOSIS — N3001 Acute cystitis with hematuria: Secondary | ICD-10-CM | POA: Diagnosis not present

## 2019-02-24 DIAGNOSIS — K760 Fatty (change of) liver, not elsewhere classified: Secondary | ICD-10-CM | POA: Diagnosis not present

## 2019-02-24 DIAGNOSIS — E1140 Type 2 diabetes mellitus with diabetic neuropathy, unspecified: Secondary | ICD-10-CM | POA: Diagnosis not present

## 2019-02-24 DIAGNOSIS — R410 Disorientation, unspecified: Secondary | ICD-10-CM | POA: Diagnosis not present

## 2019-02-24 DIAGNOSIS — N390 Urinary tract infection, site not specified: Secondary | ICD-10-CM | POA: Diagnosis not present

## 2019-02-24 DIAGNOSIS — T84092D Other mechanical complication of internal right knee prosthesis, subsequent encounter: Secondary | ICD-10-CM | POA: Diagnosis not present

## 2019-02-24 DIAGNOSIS — K7460 Unspecified cirrhosis of liver: Secondary | ICD-10-CM | POA: Diagnosis not present

## 2019-02-28 DIAGNOSIS — F331 Major depressive disorder, recurrent, moderate: Secondary | ICD-10-CM | POA: Diagnosis not present

## 2019-02-28 DIAGNOSIS — F064 Anxiety disorder due to known physiological condition: Secondary | ICD-10-CM | POA: Diagnosis not present

## 2019-03-02 DIAGNOSIS — W19XXXD Unspecified fall, subsequent encounter: Secondary | ICD-10-CM | POA: Diagnosis not present

## 2019-03-02 DIAGNOSIS — T84092D Other mechanical complication of internal right knee prosthesis, subsequent encounter: Secondary | ICD-10-CM | POA: Diagnosis not present

## 2019-03-02 DIAGNOSIS — N3001 Acute cystitis with hematuria: Secondary | ICD-10-CM | POA: Diagnosis not present

## 2019-03-02 DIAGNOSIS — K7290 Hepatic failure, unspecified without coma: Secondary | ICD-10-CM | POA: Diagnosis not present

## 2019-03-07 DIAGNOSIS — K760 Fatty (change of) liver, not elsewhere classified: Secondary | ICD-10-CM | POA: Diagnosis not present

## 2019-03-07 DIAGNOSIS — M66261 Spontaneous rupture of extensor tendons, right lower leg: Secondary | ICD-10-CM | POA: Diagnosis not present

## 2019-03-07 DIAGNOSIS — F418 Other specified anxiety disorders: Secondary | ICD-10-CM | POA: Diagnosis not present

## 2019-03-07 DIAGNOSIS — M9711XD Periprosthetic fracture around internal prosthetic right knee joint, subsequent encounter: Secondary | ICD-10-CM | POA: Diagnosis not present

## 2019-03-07 DIAGNOSIS — K7290 Hepatic failure, unspecified without coma: Secondary | ICD-10-CM | POA: Diagnosis not present

## 2019-03-07 DIAGNOSIS — E785 Hyperlipidemia, unspecified: Secondary | ICD-10-CM | POA: Diagnosis not present

## 2019-03-07 DIAGNOSIS — Z9181 History of falling: Secondary | ICD-10-CM | POA: Diagnosis not present

## 2019-03-07 DIAGNOSIS — Z794 Long term (current) use of insulin: Secondary | ICD-10-CM | POA: Diagnosis not present

## 2019-03-07 DIAGNOSIS — Z79891 Long term (current) use of opiate analgesic: Secondary | ICD-10-CM | POA: Diagnosis not present

## 2019-03-07 DIAGNOSIS — I119 Hypertensive heart disease without heart failure: Secondary | ICD-10-CM | POA: Diagnosis not present

## 2019-03-07 DIAGNOSIS — T84092D Other mechanical complication of internal right knee prosthesis, subsequent encounter: Secondary | ICD-10-CM | POA: Diagnosis not present

## 2019-03-07 DIAGNOSIS — Z792 Long term (current) use of antibiotics: Secondary | ICD-10-CM | POA: Diagnosis not present

## 2019-03-07 DIAGNOSIS — D61818 Other pancytopenia: Secondary | ICD-10-CM | POA: Diagnosis not present

## 2019-03-07 DIAGNOSIS — Z87440 Personal history of urinary (tract) infections: Secondary | ICD-10-CM | POA: Diagnosis not present

## 2019-03-07 DIAGNOSIS — E1142 Type 2 diabetes mellitus with diabetic polyneuropathy: Secondary | ICD-10-CM | POA: Diagnosis not present

## 2019-03-07 DIAGNOSIS — W19XXXD Unspecified fall, subsequent encounter: Secondary | ICD-10-CM | POA: Diagnosis not present

## 2019-03-07 DIAGNOSIS — G40909 Epilepsy, unspecified, not intractable, without status epilepticus: Secondary | ICD-10-CM | POA: Diagnosis not present

## 2019-03-09 DIAGNOSIS — T84092D Other mechanical complication of internal right knee prosthesis, subsequent encounter: Secondary | ICD-10-CM | POA: Diagnosis not present

## 2019-03-09 DIAGNOSIS — E1142 Type 2 diabetes mellitus with diabetic polyneuropathy: Secondary | ICD-10-CM | POA: Diagnosis not present

## 2019-03-09 DIAGNOSIS — W19XXXD Unspecified fall, subsequent encounter: Secondary | ICD-10-CM | POA: Diagnosis not present

## 2019-03-09 DIAGNOSIS — M66261 Spontaneous rupture of extensor tendons, right lower leg: Secondary | ICD-10-CM | POA: Diagnosis not present

## 2019-03-09 DIAGNOSIS — K7290 Hepatic failure, unspecified without coma: Secondary | ICD-10-CM | POA: Diagnosis not present

## 2019-03-09 DIAGNOSIS — M9711XD Periprosthetic fracture around internal prosthetic right knee joint, subsequent encounter: Secondary | ICD-10-CM | POA: Diagnosis not present

## 2019-03-10 DIAGNOSIS — M66261 Spontaneous rupture of extensor tendons, right lower leg: Secondary | ICD-10-CM | POA: Diagnosis not present

## 2019-03-10 DIAGNOSIS — W19XXXD Unspecified fall, subsequent encounter: Secondary | ICD-10-CM | POA: Diagnosis not present

## 2019-03-10 DIAGNOSIS — E1142 Type 2 diabetes mellitus with diabetic polyneuropathy: Secondary | ICD-10-CM | POA: Diagnosis not present

## 2019-03-10 DIAGNOSIS — T84092D Other mechanical complication of internal right knee prosthesis, subsequent encounter: Secondary | ICD-10-CM | POA: Diagnosis not present

## 2019-03-10 DIAGNOSIS — M9711XD Periprosthetic fracture around internal prosthetic right knee joint, subsequent encounter: Secondary | ICD-10-CM | POA: Diagnosis not present

## 2019-03-10 DIAGNOSIS — K7290 Hepatic failure, unspecified without coma: Secondary | ICD-10-CM | POA: Diagnosis not present

## 2019-03-13 DIAGNOSIS — M66261 Spontaneous rupture of extensor tendons, right lower leg: Secondary | ICD-10-CM | POA: Diagnosis not present

## 2019-03-13 DIAGNOSIS — T84092D Other mechanical complication of internal right knee prosthesis, subsequent encounter: Secondary | ICD-10-CM | POA: Diagnosis not present

## 2019-03-13 DIAGNOSIS — M9711XD Periprosthetic fracture around internal prosthetic right knee joint, subsequent encounter: Secondary | ICD-10-CM | POA: Diagnosis not present

## 2019-03-13 DIAGNOSIS — W19XXXD Unspecified fall, subsequent encounter: Secondary | ICD-10-CM | POA: Diagnosis not present

## 2019-03-13 DIAGNOSIS — E1142 Type 2 diabetes mellitus with diabetic polyneuropathy: Secondary | ICD-10-CM | POA: Diagnosis not present

## 2019-03-13 DIAGNOSIS — K7290 Hepatic failure, unspecified without coma: Secondary | ICD-10-CM | POA: Diagnosis not present

## 2019-03-14 DIAGNOSIS — K7290 Hepatic failure, unspecified without coma: Secondary | ICD-10-CM | POA: Diagnosis not present

## 2019-03-14 DIAGNOSIS — M9711XD Periprosthetic fracture around internal prosthetic right knee joint, subsequent encounter: Secondary | ICD-10-CM | POA: Diagnosis not present

## 2019-03-14 DIAGNOSIS — T84092D Other mechanical complication of internal right knee prosthesis, subsequent encounter: Secondary | ICD-10-CM | POA: Diagnosis not present

## 2019-03-14 DIAGNOSIS — M66261 Spontaneous rupture of extensor tendons, right lower leg: Secondary | ICD-10-CM | POA: Diagnosis not present

## 2019-03-14 DIAGNOSIS — E1142 Type 2 diabetes mellitus with diabetic polyneuropathy: Secondary | ICD-10-CM | POA: Diagnosis not present

## 2019-03-14 DIAGNOSIS — W19XXXD Unspecified fall, subsequent encounter: Secondary | ICD-10-CM | POA: Diagnosis not present

## 2019-03-16 DIAGNOSIS — K7290 Hepatic failure, unspecified without coma: Secondary | ICD-10-CM | POA: Diagnosis not present

## 2019-03-16 DIAGNOSIS — E1142 Type 2 diabetes mellitus with diabetic polyneuropathy: Secondary | ICD-10-CM | POA: Diagnosis not present

## 2019-03-16 DIAGNOSIS — T84092D Other mechanical complication of internal right knee prosthesis, subsequent encounter: Secondary | ICD-10-CM | POA: Diagnosis not present

## 2019-03-16 DIAGNOSIS — M66261 Spontaneous rupture of extensor tendons, right lower leg: Secondary | ICD-10-CM | POA: Diagnosis not present

## 2019-03-16 DIAGNOSIS — M9711XD Periprosthetic fracture around internal prosthetic right knee joint, subsequent encounter: Secondary | ICD-10-CM | POA: Diagnosis not present

## 2019-03-16 DIAGNOSIS — W19XXXD Unspecified fall, subsequent encounter: Secondary | ICD-10-CM | POA: Diagnosis not present

## 2019-03-20 DIAGNOSIS — T84092D Other mechanical complication of internal right knee prosthesis, subsequent encounter: Secondary | ICD-10-CM | POA: Diagnosis not present

## 2019-03-20 DIAGNOSIS — E1142 Type 2 diabetes mellitus with diabetic polyneuropathy: Secondary | ICD-10-CM | POA: Diagnosis not present

## 2019-03-20 DIAGNOSIS — M9711XD Periprosthetic fracture around internal prosthetic right knee joint, subsequent encounter: Secondary | ICD-10-CM | POA: Diagnosis not present

## 2019-03-20 DIAGNOSIS — K7290 Hepatic failure, unspecified without coma: Secondary | ICD-10-CM | POA: Diagnosis not present

## 2019-03-20 DIAGNOSIS — W19XXXD Unspecified fall, subsequent encounter: Secondary | ICD-10-CM | POA: Diagnosis not present

## 2019-03-20 DIAGNOSIS — M66261 Spontaneous rupture of extensor tendons, right lower leg: Secondary | ICD-10-CM | POA: Diagnosis not present

## 2019-03-22 DIAGNOSIS — E1142 Type 2 diabetes mellitus with diabetic polyneuropathy: Secondary | ICD-10-CM | POA: Diagnosis not present

## 2019-03-22 DIAGNOSIS — K7290 Hepatic failure, unspecified without coma: Secondary | ICD-10-CM | POA: Diagnosis not present

## 2019-03-22 DIAGNOSIS — M66261 Spontaneous rupture of extensor tendons, right lower leg: Secondary | ICD-10-CM | POA: Diagnosis not present

## 2019-03-22 DIAGNOSIS — M9711XD Periprosthetic fracture around internal prosthetic right knee joint, subsequent encounter: Secondary | ICD-10-CM | POA: Diagnosis not present

## 2019-03-22 DIAGNOSIS — W19XXXD Unspecified fall, subsequent encounter: Secondary | ICD-10-CM | POA: Diagnosis not present

## 2019-03-22 DIAGNOSIS — T84092D Other mechanical complication of internal right knee prosthesis, subsequent encounter: Secondary | ICD-10-CM | POA: Diagnosis not present

## 2019-03-23 DIAGNOSIS — T84092D Other mechanical complication of internal right knee prosthesis, subsequent encounter: Secondary | ICD-10-CM | POA: Diagnosis not present

## 2019-03-23 DIAGNOSIS — W19XXXD Unspecified fall, subsequent encounter: Secondary | ICD-10-CM | POA: Diagnosis not present

## 2019-03-23 DIAGNOSIS — E1142 Type 2 diabetes mellitus with diabetic polyneuropathy: Secondary | ICD-10-CM | POA: Diagnosis not present

## 2019-03-23 DIAGNOSIS — M9711XD Periprosthetic fracture around internal prosthetic right knee joint, subsequent encounter: Secondary | ICD-10-CM | POA: Diagnosis not present

## 2019-03-23 DIAGNOSIS — M66261 Spontaneous rupture of extensor tendons, right lower leg: Secondary | ICD-10-CM | POA: Diagnosis not present

## 2019-03-23 DIAGNOSIS — K7290 Hepatic failure, unspecified without coma: Secondary | ICD-10-CM | POA: Diagnosis not present

## 2019-03-27 DIAGNOSIS — W19XXXD Unspecified fall, subsequent encounter: Secondary | ICD-10-CM | POA: Diagnosis not present

## 2019-03-27 DIAGNOSIS — M66261 Spontaneous rupture of extensor tendons, right lower leg: Secondary | ICD-10-CM | POA: Diagnosis not present

## 2019-03-27 DIAGNOSIS — T84092D Other mechanical complication of internal right knee prosthesis, subsequent encounter: Secondary | ICD-10-CM | POA: Diagnosis not present

## 2019-03-27 DIAGNOSIS — R310 Gross hematuria: Secondary | ICD-10-CM | POA: Diagnosis not present

## 2019-03-27 DIAGNOSIS — E1142 Type 2 diabetes mellitus with diabetic polyneuropathy: Secondary | ICD-10-CM | POA: Diagnosis not present

## 2019-03-27 DIAGNOSIS — M9711XD Periprosthetic fracture around internal prosthetic right knee joint, subsequent encounter: Secondary | ICD-10-CM | POA: Diagnosis not present

## 2019-03-27 DIAGNOSIS — K7290 Hepatic failure, unspecified without coma: Secondary | ICD-10-CM | POA: Diagnosis not present

## 2019-03-28 DIAGNOSIS — M66261 Spontaneous rupture of extensor tendons, right lower leg: Secondary | ICD-10-CM | POA: Diagnosis not present

## 2019-03-28 DIAGNOSIS — M9711XD Periprosthetic fracture around internal prosthetic right knee joint, subsequent encounter: Secondary | ICD-10-CM | POA: Diagnosis not present

## 2019-03-28 DIAGNOSIS — W19XXXD Unspecified fall, subsequent encounter: Secondary | ICD-10-CM | POA: Diagnosis not present

## 2019-03-28 DIAGNOSIS — T84092D Other mechanical complication of internal right knee prosthesis, subsequent encounter: Secondary | ICD-10-CM | POA: Diagnosis not present

## 2019-03-28 DIAGNOSIS — K7290 Hepatic failure, unspecified without coma: Secondary | ICD-10-CM | POA: Diagnosis not present

## 2019-03-28 DIAGNOSIS — E1142 Type 2 diabetes mellitus with diabetic polyneuropathy: Secondary | ICD-10-CM | POA: Diagnosis not present

## 2019-03-29 DIAGNOSIS — T84092D Other mechanical complication of internal right knee prosthesis, subsequent encounter: Secondary | ICD-10-CM | POA: Diagnosis not present

## 2019-03-29 DIAGNOSIS — K7290 Hepatic failure, unspecified without coma: Secondary | ICD-10-CM | POA: Diagnosis not present

## 2019-03-29 DIAGNOSIS — W19XXXD Unspecified fall, subsequent encounter: Secondary | ICD-10-CM | POA: Diagnosis not present

## 2019-03-29 DIAGNOSIS — M9711XD Periprosthetic fracture around internal prosthetic right knee joint, subsequent encounter: Secondary | ICD-10-CM | POA: Diagnosis not present

## 2019-03-29 DIAGNOSIS — E1142 Type 2 diabetes mellitus with diabetic polyneuropathy: Secondary | ICD-10-CM | POA: Diagnosis not present

## 2019-03-29 DIAGNOSIS — M66261 Spontaneous rupture of extensor tendons, right lower leg: Secondary | ICD-10-CM | POA: Diagnosis not present

## 2019-03-30 DIAGNOSIS — W19XXXD Unspecified fall, subsequent encounter: Secondary | ICD-10-CM | POA: Diagnosis not present

## 2019-03-30 DIAGNOSIS — K7290 Hepatic failure, unspecified without coma: Secondary | ICD-10-CM | POA: Diagnosis not present

## 2019-03-30 DIAGNOSIS — M66261 Spontaneous rupture of extensor tendons, right lower leg: Secondary | ICD-10-CM | POA: Diagnosis not present

## 2019-03-30 DIAGNOSIS — T84092D Other mechanical complication of internal right knee prosthesis, subsequent encounter: Secondary | ICD-10-CM | POA: Diagnosis not present

## 2019-03-30 DIAGNOSIS — M9711XD Periprosthetic fracture around internal prosthetic right knee joint, subsequent encounter: Secondary | ICD-10-CM | POA: Diagnosis not present

## 2019-03-30 DIAGNOSIS — E1142 Type 2 diabetes mellitus with diabetic polyneuropathy: Secondary | ICD-10-CM | POA: Diagnosis not present

## 2019-04-03 DIAGNOSIS — T84092D Other mechanical complication of internal right knee prosthesis, subsequent encounter: Secondary | ICD-10-CM | POA: Diagnosis not present

## 2019-04-03 DIAGNOSIS — W19XXXD Unspecified fall, subsequent encounter: Secondary | ICD-10-CM | POA: Diagnosis not present

## 2019-04-03 DIAGNOSIS — K7290 Hepatic failure, unspecified without coma: Secondary | ICD-10-CM | POA: Diagnosis not present

## 2019-04-03 DIAGNOSIS — M66261 Spontaneous rupture of extensor tendons, right lower leg: Secondary | ICD-10-CM | POA: Diagnosis not present

## 2019-04-03 DIAGNOSIS — M9711XD Periprosthetic fracture around internal prosthetic right knee joint, subsequent encounter: Secondary | ICD-10-CM | POA: Diagnosis not present

## 2019-04-03 DIAGNOSIS — E1142 Type 2 diabetes mellitus with diabetic polyneuropathy: Secondary | ICD-10-CM | POA: Diagnosis not present

## 2019-04-04 DIAGNOSIS — W19XXXD Unspecified fall, subsequent encounter: Secondary | ICD-10-CM | POA: Diagnosis not present

## 2019-04-04 DIAGNOSIS — E1142 Type 2 diabetes mellitus with diabetic polyneuropathy: Secondary | ICD-10-CM | POA: Diagnosis not present

## 2019-04-04 DIAGNOSIS — M66261 Spontaneous rupture of extensor tendons, right lower leg: Secondary | ICD-10-CM | POA: Diagnosis not present

## 2019-04-04 DIAGNOSIS — M9711XD Periprosthetic fracture around internal prosthetic right knee joint, subsequent encounter: Secondary | ICD-10-CM | POA: Diagnosis not present

## 2019-04-04 DIAGNOSIS — K7290 Hepatic failure, unspecified without coma: Secondary | ICD-10-CM | POA: Diagnosis not present

## 2019-04-04 DIAGNOSIS — T84092D Other mechanical complication of internal right knee prosthesis, subsequent encounter: Secondary | ICD-10-CM | POA: Diagnosis not present

## 2019-04-05 DIAGNOSIS — W19XXXD Unspecified fall, subsequent encounter: Secondary | ICD-10-CM | POA: Diagnosis not present

## 2019-04-05 DIAGNOSIS — M66261 Spontaneous rupture of extensor tendons, right lower leg: Secondary | ICD-10-CM | POA: Diagnosis not present

## 2019-04-05 DIAGNOSIS — K7290 Hepatic failure, unspecified without coma: Secondary | ICD-10-CM | POA: Diagnosis not present

## 2019-04-05 DIAGNOSIS — E1142 Type 2 diabetes mellitus with diabetic polyneuropathy: Secondary | ICD-10-CM | POA: Diagnosis not present

## 2019-04-05 DIAGNOSIS — T84092D Other mechanical complication of internal right knee prosthesis, subsequent encounter: Secondary | ICD-10-CM | POA: Diagnosis not present

## 2019-04-05 DIAGNOSIS — M9711XD Periprosthetic fracture around internal prosthetic right knee joint, subsequent encounter: Secondary | ICD-10-CM | POA: Diagnosis not present

## 2019-04-06 DIAGNOSIS — E876 Hypokalemia: Secondary | ICD-10-CM | POA: Diagnosis not present

## 2019-04-06 DIAGNOSIS — Z87440 Personal history of urinary (tract) infections: Secondary | ICD-10-CM | POA: Diagnosis not present

## 2019-04-06 DIAGNOSIS — K760 Fatty (change of) liver, not elsewhere classified: Secondary | ICD-10-CM | POA: Diagnosis not present

## 2019-04-06 DIAGNOSIS — D61818 Other pancytopenia: Secondary | ICD-10-CM | POA: Diagnosis not present

## 2019-04-06 DIAGNOSIS — E785 Hyperlipidemia, unspecified: Secondary | ICD-10-CM | POA: Diagnosis not present

## 2019-04-06 DIAGNOSIS — Z9181 History of falling: Secondary | ICD-10-CM | POA: Diagnosis not present

## 2019-04-06 DIAGNOSIS — M66261 Spontaneous rupture of extensor tendons, right lower leg: Secondary | ICD-10-CM | POA: Diagnosis not present

## 2019-04-06 DIAGNOSIS — F418 Other specified anxiety disorders: Secondary | ICD-10-CM | POA: Diagnosis not present

## 2019-04-06 DIAGNOSIS — G40909 Epilepsy, unspecified, not intractable, without status epilepticus: Secondary | ICD-10-CM | POA: Diagnosis not present

## 2019-04-06 DIAGNOSIS — I119 Hypertensive heart disease without heart failure: Secondary | ICD-10-CM | POA: Diagnosis not present

## 2019-04-06 DIAGNOSIS — K7290 Hepatic failure, unspecified without coma: Secondary | ICD-10-CM | POA: Diagnosis not present

## 2019-04-06 DIAGNOSIS — Z79891 Long term (current) use of opiate analgesic: Secondary | ICD-10-CM | POA: Diagnosis not present

## 2019-04-06 DIAGNOSIS — T84092D Other mechanical complication of internal right knee prosthesis, subsequent encounter: Secondary | ICD-10-CM | POA: Diagnosis not present

## 2019-04-06 DIAGNOSIS — W19XXXD Unspecified fall, subsequent encounter: Secondary | ICD-10-CM | POA: Diagnosis not present

## 2019-04-06 DIAGNOSIS — Z794 Long term (current) use of insulin: Secondary | ICD-10-CM | POA: Diagnosis not present

## 2019-04-06 DIAGNOSIS — Z792 Long term (current) use of antibiotics: Secondary | ICD-10-CM | POA: Diagnosis not present

## 2019-04-06 DIAGNOSIS — M9711XD Periprosthetic fracture around internal prosthetic right knee joint, subsequent encounter: Secondary | ICD-10-CM | POA: Diagnosis not present

## 2019-04-06 DIAGNOSIS — E1142 Type 2 diabetes mellitus with diabetic polyneuropathy: Secondary | ICD-10-CM | POA: Diagnosis not present

## 2019-04-06 DIAGNOSIS — R7989 Other specified abnormal findings of blood chemistry: Secondary | ICD-10-CM | POA: Diagnosis not present

## 2019-04-10 DIAGNOSIS — M66261 Spontaneous rupture of extensor tendons, right lower leg: Secondary | ICD-10-CM | POA: Diagnosis not present

## 2019-04-10 DIAGNOSIS — W19XXXD Unspecified fall, subsequent encounter: Secondary | ICD-10-CM | POA: Diagnosis not present

## 2019-04-10 DIAGNOSIS — M9711XD Periprosthetic fracture around internal prosthetic right knee joint, subsequent encounter: Secondary | ICD-10-CM | POA: Diagnosis not present

## 2019-04-10 DIAGNOSIS — T84092D Other mechanical complication of internal right knee prosthesis, subsequent encounter: Secondary | ICD-10-CM | POA: Diagnosis not present

## 2019-04-10 DIAGNOSIS — E1142 Type 2 diabetes mellitus with diabetic polyneuropathy: Secondary | ICD-10-CM | POA: Diagnosis not present

## 2019-04-10 DIAGNOSIS — K7290 Hepatic failure, unspecified without coma: Secondary | ICD-10-CM | POA: Diagnosis not present

## 2019-04-12 DIAGNOSIS — M66261 Spontaneous rupture of extensor tendons, right lower leg: Secondary | ICD-10-CM | POA: Diagnosis not present

## 2019-04-12 DIAGNOSIS — K7290 Hepatic failure, unspecified without coma: Secondary | ICD-10-CM | POA: Diagnosis not present

## 2019-04-12 DIAGNOSIS — M9711XD Periprosthetic fracture around internal prosthetic right knee joint, subsequent encounter: Secondary | ICD-10-CM | POA: Diagnosis not present

## 2019-04-12 DIAGNOSIS — W19XXXD Unspecified fall, subsequent encounter: Secondary | ICD-10-CM | POA: Diagnosis not present

## 2019-04-12 DIAGNOSIS — T84092D Other mechanical complication of internal right knee prosthesis, subsequent encounter: Secondary | ICD-10-CM | POA: Diagnosis not present

## 2019-04-12 DIAGNOSIS — E1142 Type 2 diabetes mellitus with diabetic polyneuropathy: Secondary | ICD-10-CM | POA: Diagnosis not present

## 2019-04-13 DIAGNOSIS — Z4789 Encounter for other orthopedic aftercare: Secondary | ICD-10-CM | POA: Diagnosis not present

## 2019-04-13 DIAGNOSIS — M1712 Unilateral primary osteoarthritis, left knee: Secondary | ICD-10-CM | POA: Diagnosis not present

## 2019-04-13 DIAGNOSIS — M25562 Pain in left knee: Secondary | ICD-10-CM | POA: Diagnosis not present

## 2019-04-14 DIAGNOSIS — W19XXXD Unspecified fall, subsequent encounter: Secondary | ICD-10-CM | POA: Diagnosis not present

## 2019-04-14 DIAGNOSIS — T84092D Other mechanical complication of internal right knee prosthesis, subsequent encounter: Secondary | ICD-10-CM | POA: Diagnosis not present

## 2019-04-14 DIAGNOSIS — E1142 Type 2 diabetes mellitus with diabetic polyneuropathy: Secondary | ICD-10-CM | POA: Diagnosis not present

## 2019-04-14 DIAGNOSIS — M9711XD Periprosthetic fracture around internal prosthetic right knee joint, subsequent encounter: Secondary | ICD-10-CM | POA: Diagnosis not present

## 2019-04-14 DIAGNOSIS — M66261 Spontaneous rupture of extensor tendons, right lower leg: Secondary | ICD-10-CM | POA: Diagnosis not present

## 2019-04-14 DIAGNOSIS — K7290 Hepatic failure, unspecified without coma: Secondary | ICD-10-CM | POA: Diagnosis not present

## 2019-04-17 DIAGNOSIS — E139 Other specified diabetes mellitus without complications: Secondary | ICD-10-CM | POA: Diagnosis not present

## 2019-04-17 DIAGNOSIS — E089 Diabetes mellitus due to underlying condition without complications: Secondary | ICD-10-CM | POA: Diagnosis not present

## 2019-04-17 DIAGNOSIS — N390 Urinary tract infection, site not specified: Secondary | ICD-10-CM | POA: Diagnosis not present

## 2019-04-17 DIAGNOSIS — K7290 Hepatic failure, unspecified without coma: Secondary | ICD-10-CM | POA: Diagnosis not present

## 2019-04-17 DIAGNOSIS — T84092D Other mechanical complication of internal right knee prosthesis, subsequent encounter: Secondary | ICD-10-CM | POA: Diagnosis not present

## 2019-04-17 DIAGNOSIS — M9711XD Periprosthetic fracture around internal prosthetic right knee joint, subsequent encounter: Secondary | ICD-10-CM | POA: Diagnosis not present

## 2019-04-17 DIAGNOSIS — D6949 Other primary thrombocytopenia: Secondary | ICD-10-CM | POA: Diagnosis not present

## 2019-04-17 DIAGNOSIS — W19XXXD Unspecified fall, subsequent encounter: Secondary | ICD-10-CM | POA: Diagnosis not present

## 2019-04-17 DIAGNOSIS — M66261 Spontaneous rupture of extensor tendons, right lower leg: Secondary | ICD-10-CM | POA: Diagnosis not present

## 2019-04-17 DIAGNOSIS — E1142 Type 2 diabetes mellitus with diabetic polyneuropathy: Secondary | ICD-10-CM | POA: Diagnosis not present

## 2019-04-19 DIAGNOSIS — K7290 Hepatic failure, unspecified without coma: Secondary | ICD-10-CM | POA: Diagnosis not present

## 2019-04-19 DIAGNOSIS — E1142 Type 2 diabetes mellitus with diabetic polyneuropathy: Secondary | ICD-10-CM | POA: Diagnosis not present

## 2019-04-19 DIAGNOSIS — M9711XD Periprosthetic fracture around internal prosthetic right knee joint, subsequent encounter: Secondary | ICD-10-CM | POA: Diagnosis not present

## 2019-04-19 DIAGNOSIS — W19XXXD Unspecified fall, subsequent encounter: Secondary | ICD-10-CM | POA: Diagnosis not present

## 2019-04-19 DIAGNOSIS — I119 Hypertensive heart disease without heart failure: Secondary | ICD-10-CM | POA: Diagnosis not present

## 2019-04-19 DIAGNOSIS — T84092D Other mechanical complication of internal right knee prosthesis, subsequent encounter: Secondary | ICD-10-CM | POA: Diagnosis not present

## 2019-04-19 DIAGNOSIS — M66261 Spontaneous rupture of extensor tendons, right lower leg: Secondary | ICD-10-CM | POA: Diagnosis not present

## 2019-04-20 DIAGNOSIS — W19XXXD Unspecified fall, subsequent encounter: Secondary | ICD-10-CM | POA: Diagnosis not present

## 2019-04-20 DIAGNOSIS — T84092D Other mechanical complication of internal right knee prosthesis, subsequent encounter: Secondary | ICD-10-CM | POA: Diagnosis not present

## 2019-04-20 DIAGNOSIS — E1142 Type 2 diabetes mellitus with diabetic polyneuropathy: Secondary | ICD-10-CM | POA: Diagnosis not present

## 2019-04-20 DIAGNOSIS — K7290 Hepatic failure, unspecified without coma: Secondary | ICD-10-CM | POA: Diagnosis not present

## 2019-04-20 DIAGNOSIS — M9711XD Periprosthetic fracture around internal prosthetic right knee joint, subsequent encounter: Secondary | ICD-10-CM | POA: Diagnosis not present

## 2019-04-20 DIAGNOSIS — M66261 Spontaneous rupture of extensor tendons, right lower leg: Secondary | ICD-10-CM | POA: Diagnosis not present

## 2019-04-21 DIAGNOSIS — I10 Essential (primary) hypertension: Secondary | ICD-10-CM | POA: Diagnosis not present

## 2019-04-21 DIAGNOSIS — E7849 Other hyperlipidemia: Secondary | ICD-10-CM | POA: Diagnosis not present

## 2019-04-23 DIAGNOSIS — T84012A Broken internal right knee prosthesis, initial encounter: Secondary | ICD-10-CM | POA: Diagnosis not present

## 2019-04-23 DIAGNOSIS — G4700 Insomnia, unspecified: Secondary | ICD-10-CM | POA: Diagnosis not present

## 2019-04-23 DIAGNOSIS — D649 Anemia, unspecified: Secondary | ICD-10-CM | POA: Diagnosis not present

## 2019-04-23 DIAGNOSIS — I10 Essential (primary) hypertension: Secondary | ICD-10-CM | POA: Diagnosis not present

## 2019-04-23 DIAGNOSIS — K7290 Hepatic failure, unspecified without coma: Secondary | ICD-10-CM | POA: Diagnosis not present

## 2019-04-23 DIAGNOSIS — E1165 Type 2 diabetes mellitus with hyperglycemia: Secondary | ICD-10-CM | POA: Diagnosis not present

## 2019-04-23 DIAGNOSIS — E782 Mixed hyperlipidemia: Secondary | ICD-10-CM | POA: Diagnosis not present

## 2019-04-23 DIAGNOSIS — E876 Hypokalemia: Secondary | ICD-10-CM | POA: Diagnosis not present

## 2019-04-24 DIAGNOSIS — W19XXXD Unspecified fall, subsequent encounter: Secondary | ICD-10-CM | POA: Diagnosis not present

## 2019-04-24 DIAGNOSIS — M66261 Spontaneous rupture of extensor tendons, right lower leg: Secondary | ICD-10-CM | POA: Diagnosis not present

## 2019-04-24 DIAGNOSIS — K7290 Hepatic failure, unspecified without coma: Secondary | ICD-10-CM | POA: Diagnosis not present

## 2019-04-24 DIAGNOSIS — T84092D Other mechanical complication of internal right knee prosthesis, subsequent encounter: Secondary | ICD-10-CM | POA: Diagnosis not present

## 2019-04-24 DIAGNOSIS — M9711XD Periprosthetic fracture around internal prosthetic right knee joint, subsequent encounter: Secondary | ICD-10-CM | POA: Diagnosis not present

## 2019-04-24 DIAGNOSIS — E1142 Type 2 diabetes mellitus with diabetic polyneuropathy: Secondary | ICD-10-CM | POA: Diagnosis not present

## 2019-04-25 DIAGNOSIS — M9711XD Periprosthetic fracture around internal prosthetic right knee joint, subsequent encounter: Secondary | ICD-10-CM | POA: Diagnosis not present

## 2019-04-25 DIAGNOSIS — T84092D Other mechanical complication of internal right knee prosthesis, subsequent encounter: Secondary | ICD-10-CM | POA: Diagnosis not present

## 2019-04-25 DIAGNOSIS — M66261 Spontaneous rupture of extensor tendons, right lower leg: Secondary | ICD-10-CM | POA: Diagnosis not present

## 2019-04-25 DIAGNOSIS — E1142 Type 2 diabetes mellitus with diabetic polyneuropathy: Secondary | ICD-10-CM | POA: Diagnosis not present

## 2019-04-25 DIAGNOSIS — K7290 Hepatic failure, unspecified without coma: Secondary | ICD-10-CM | POA: Diagnosis not present

## 2019-04-25 DIAGNOSIS — W19XXXD Unspecified fall, subsequent encounter: Secondary | ICD-10-CM | POA: Diagnosis not present

## 2019-04-26 DIAGNOSIS — K7290 Hepatic failure, unspecified without coma: Secondary | ICD-10-CM | POA: Diagnosis not present

## 2019-04-26 DIAGNOSIS — W19XXXD Unspecified fall, subsequent encounter: Secondary | ICD-10-CM | POA: Diagnosis not present

## 2019-04-26 DIAGNOSIS — M66261 Spontaneous rupture of extensor tendons, right lower leg: Secondary | ICD-10-CM | POA: Diagnosis not present

## 2019-04-26 DIAGNOSIS — M9711XD Periprosthetic fracture around internal prosthetic right knee joint, subsequent encounter: Secondary | ICD-10-CM | POA: Diagnosis not present

## 2019-04-26 DIAGNOSIS — T84092D Other mechanical complication of internal right knee prosthesis, subsequent encounter: Secondary | ICD-10-CM | POA: Diagnosis not present

## 2019-04-26 DIAGNOSIS — E1142 Type 2 diabetes mellitus with diabetic polyneuropathy: Secondary | ICD-10-CM | POA: Diagnosis not present

## 2019-05-01 DIAGNOSIS — M9711XD Periprosthetic fracture around internal prosthetic right knee joint, subsequent encounter: Secondary | ICD-10-CM | POA: Diagnosis not present

## 2019-05-01 DIAGNOSIS — E1142 Type 2 diabetes mellitus with diabetic polyneuropathy: Secondary | ICD-10-CM | POA: Diagnosis not present

## 2019-05-01 DIAGNOSIS — M66261 Spontaneous rupture of extensor tendons, right lower leg: Secondary | ICD-10-CM | POA: Diagnosis not present

## 2019-05-01 DIAGNOSIS — W19XXXD Unspecified fall, subsequent encounter: Secondary | ICD-10-CM | POA: Diagnosis not present

## 2019-05-01 DIAGNOSIS — K7290 Hepatic failure, unspecified without coma: Secondary | ICD-10-CM | POA: Diagnosis not present

## 2019-05-01 DIAGNOSIS — T84092D Other mechanical complication of internal right knee prosthesis, subsequent encounter: Secondary | ICD-10-CM | POA: Diagnosis not present

## 2019-05-02 DIAGNOSIS — Z20828 Contact with and (suspected) exposure to other viral communicable diseases: Secondary | ICD-10-CM | POA: Diagnosis not present

## 2019-05-03 DIAGNOSIS — E1300 Other specified diabetes mellitus with hyperosmolarity without nonketotic hyperglycemic-hyperosmolar coma (NKHHC): Secondary | ICD-10-CM | POA: Diagnosis not present

## 2019-05-03 DIAGNOSIS — M9711XD Periprosthetic fracture around internal prosthetic right knee joint, subsequent encounter: Secondary | ICD-10-CM | POA: Diagnosis not present

## 2019-05-03 DIAGNOSIS — D6949 Other primary thrombocytopenia: Secondary | ICD-10-CM | POA: Diagnosis not present

## 2019-05-03 DIAGNOSIS — T84092D Other mechanical complication of internal right knee prosthesis, subsequent encounter: Secondary | ICD-10-CM | POA: Diagnosis not present

## 2019-05-03 DIAGNOSIS — K7290 Hepatic failure, unspecified without coma: Secondary | ICD-10-CM | POA: Diagnosis not present

## 2019-05-03 DIAGNOSIS — W19XXXD Unspecified fall, subsequent encounter: Secondary | ICD-10-CM | POA: Diagnosis not present

## 2019-05-03 DIAGNOSIS — M66261 Spontaneous rupture of extensor tendons, right lower leg: Secondary | ICD-10-CM | POA: Diagnosis not present

## 2019-05-03 DIAGNOSIS — N390 Urinary tract infection, site not specified: Secondary | ICD-10-CM | POA: Diagnosis not present

## 2019-05-03 DIAGNOSIS — E1142 Type 2 diabetes mellitus with diabetic polyneuropathy: Secondary | ICD-10-CM | POA: Diagnosis not present

## 2019-05-03 DIAGNOSIS — E0800 Diabetes mellitus due to underlying condition with hyperosmolarity without nonketotic hyperglycemic-hyperosmolar coma (NKHHC): Secondary | ICD-10-CM | POA: Diagnosis not present

## 2019-05-05 DIAGNOSIS — W19XXXD Unspecified fall, subsequent encounter: Secondary | ICD-10-CM | POA: Diagnosis not present

## 2019-05-05 DIAGNOSIS — K7290 Hepatic failure, unspecified without coma: Secondary | ICD-10-CM | POA: Diagnosis not present

## 2019-05-05 DIAGNOSIS — M66261 Spontaneous rupture of extensor tendons, right lower leg: Secondary | ICD-10-CM | POA: Diagnosis not present

## 2019-05-05 DIAGNOSIS — E1142 Type 2 diabetes mellitus with diabetic polyneuropathy: Secondary | ICD-10-CM | POA: Diagnosis not present

## 2019-05-05 DIAGNOSIS — M9711XD Periprosthetic fracture around internal prosthetic right knee joint, subsequent encounter: Secondary | ICD-10-CM | POA: Diagnosis not present

## 2019-05-05 DIAGNOSIS — T84092D Other mechanical complication of internal right knee prosthesis, subsequent encounter: Secondary | ICD-10-CM | POA: Diagnosis not present

## 2019-05-06 DIAGNOSIS — W19XXXD Unspecified fall, subsequent encounter: Secondary | ICD-10-CM | POA: Diagnosis not present

## 2019-05-06 DIAGNOSIS — E1142 Type 2 diabetes mellitus with diabetic polyneuropathy: Secondary | ICD-10-CM | POA: Diagnosis not present

## 2019-05-06 DIAGNOSIS — T84092D Other mechanical complication of internal right knee prosthesis, subsequent encounter: Secondary | ICD-10-CM | POA: Diagnosis not present

## 2019-05-06 DIAGNOSIS — K760 Fatty (change of) liver, not elsewhere classified: Secondary | ICD-10-CM | POA: Diagnosis not present

## 2019-05-06 DIAGNOSIS — K7290 Hepatic failure, unspecified without coma: Secondary | ICD-10-CM | POA: Diagnosis not present

## 2019-05-06 DIAGNOSIS — Z79891 Long term (current) use of opiate analgesic: Secondary | ICD-10-CM | POA: Diagnosis not present

## 2019-05-06 DIAGNOSIS — G40909 Epilepsy, unspecified, not intractable, without status epilepticus: Secondary | ICD-10-CM | POA: Diagnosis not present

## 2019-05-06 DIAGNOSIS — M66261 Spontaneous rupture of extensor tendons, right lower leg: Secondary | ICD-10-CM | POA: Diagnosis not present

## 2019-05-06 DIAGNOSIS — E785 Hyperlipidemia, unspecified: Secondary | ICD-10-CM | POA: Diagnosis not present

## 2019-05-06 DIAGNOSIS — D61818 Other pancytopenia: Secondary | ICD-10-CM | POA: Diagnosis not present

## 2019-05-06 DIAGNOSIS — Z794 Long term (current) use of insulin: Secondary | ICD-10-CM | POA: Diagnosis not present

## 2019-05-06 DIAGNOSIS — Z792 Long term (current) use of antibiotics: Secondary | ICD-10-CM | POA: Diagnosis not present

## 2019-05-06 DIAGNOSIS — Z9181 History of falling: Secondary | ICD-10-CM | POA: Diagnosis not present

## 2019-05-06 DIAGNOSIS — I119 Hypertensive heart disease without heart failure: Secondary | ICD-10-CM | POA: Diagnosis not present

## 2019-05-06 DIAGNOSIS — Z87440 Personal history of urinary (tract) infections: Secondary | ICD-10-CM | POA: Diagnosis not present

## 2019-05-06 DIAGNOSIS — F418 Other specified anxiety disorders: Secondary | ICD-10-CM | POA: Diagnosis not present

## 2019-05-06 DIAGNOSIS — M9711XD Periprosthetic fracture around internal prosthetic right knee joint, subsequent encounter: Secondary | ICD-10-CM | POA: Diagnosis not present

## 2019-05-08 DIAGNOSIS — M9711XD Periprosthetic fracture around internal prosthetic right knee joint, subsequent encounter: Secondary | ICD-10-CM | POA: Diagnosis not present

## 2019-05-08 DIAGNOSIS — E1142 Type 2 diabetes mellitus with diabetic polyneuropathy: Secondary | ICD-10-CM | POA: Diagnosis not present

## 2019-05-08 DIAGNOSIS — K7290 Hepatic failure, unspecified without coma: Secondary | ICD-10-CM | POA: Diagnosis not present

## 2019-05-08 DIAGNOSIS — T84092D Other mechanical complication of internal right knee prosthesis, subsequent encounter: Secondary | ICD-10-CM | POA: Diagnosis not present

## 2019-05-08 DIAGNOSIS — W19XXXD Unspecified fall, subsequent encounter: Secondary | ICD-10-CM | POA: Diagnosis not present

## 2019-05-08 DIAGNOSIS — M66261 Spontaneous rupture of extensor tendons, right lower leg: Secondary | ICD-10-CM | POA: Diagnosis not present

## 2019-05-09 DIAGNOSIS — W19XXXD Unspecified fall, subsequent encounter: Secondary | ICD-10-CM | POA: Diagnosis not present

## 2019-05-09 DIAGNOSIS — E1142 Type 2 diabetes mellitus with diabetic polyneuropathy: Secondary | ICD-10-CM | POA: Diagnosis not present

## 2019-05-09 DIAGNOSIS — M66261 Spontaneous rupture of extensor tendons, right lower leg: Secondary | ICD-10-CM | POA: Diagnosis not present

## 2019-05-09 DIAGNOSIS — M9711XD Periprosthetic fracture around internal prosthetic right knee joint, subsequent encounter: Secondary | ICD-10-CM | POA: Diagnosis not present

## 2019-05-09 DIAGNOSIS — K7290 Hepatic failure, unspecified without coma: Secondary | ICD-10-CM | POA: Diagnosis not present

## 2019-05-09 DIAGNOSIS — T84092D Other mechanical complication of internal right knee prosthesis, subsequent encounter: Secondary | ICD-10-CM | POA: Diagnosis not present

## 2019-05-10 DIAGNOSIS — E1142 Type 2 diabetes mellitus with diabetic polyneuropathy: Secondary | ICD-10-CM | POA: Diagnosis not present

## 2019-05-10 DIAGNOSIS — M66261 Spontaneous rupture of extensor tendons, right lower leg: Secondary | ICD-10-CM | POA: Diagnosis not present

## 2019-05-10 DIAGNOSIS — T84092D Other mechanical complication of internal right knee prosthesis, subsequent encounter: Secondary | ICD-10-CM | POA: Diagnosis not present

## 2019-05-10 DIAGNOSIS — W19XXXD Unspecified fall, subsequent encounter: Secondary | ICD-10-CM | POA: Diagnosis not present

## 2019-05-10 DIAGNOSIS — K7290 Hepatic failure, unspecified without coma: Secondary | ICD-10-CM | POA: Diagnosis not present

## 2019-05-10 DIAGNOSIS — M9711XD Periprosthetic fracture around internal prosthetic right knee joint, subsequent encounter: Secondary | ICD-10-CM | POA: Diagnosis not present

## 2019-05-12 DIAGNOSIS — F419 Anxiety disorder, unspecified: Secondary | ICD-10-CM | POA: Diagnosis not present

## 2019-05-12 DIAGNOSIS — N3090 Cystitis, unspecified without hematuria: Secondary | ICD-10-CM | POA: Diagnosis not present

## 2019-05-16 DIAGNOSIS — K7290 Hepatic failure, unspecified without coma: Secondary | ICD-10-CM | POA: Diagnosis not present

## 2019-05-16 DIAGNOSIS — M66261 Spontaneous rupture of extensor tendons, right lower leg: Secondary | ICD-10-CM | POA: Diagnosis not present

## 2019-05-16 DIAGNOSIS — W19XXXD Unspecified fall, subsequent encounter: Secondary | ICD-10-CM | POA: Diagnosis not present

## 2019-05-16 DIAGNOSIS — E1142 Type 2 diabetes mellitus with diabetic polyneuropathy: Secondary | ICD-10-CM | POA: Diagnosis not present

## 2019-05-16 DIAGNOSIS — M9711XD Periprosthetic fracture around internal prosthetic right knee joint, subsequent encounter: Secondary | ICD-10-CM | POA: Diagnosis not present

## 2019-05-16 DIAGNOSIS — T84092D Other mechanical complication of internal right knee prosthesis, subsequent encounter: Secondary | ICD-10-CM | POA: Diagnosis not present

## 2019-05-17 DIAGNOSIS — M9711XD Periprosthetic fracture around internal prosthetic right knee joint, subsequent encounter: Secondary | ICD-10-CM | POA: Diagnosis not present

## 2019-05-17 DIAGNOSIS — W19XXXD Unspecified fall, subsequent encounter: Secondary | ICD-10-CM | POA: Diagnosis not present

## 2019-05-17 DIAGNOSIS — K7290 Hepatic failure, unspecified without coma: Secondary | ICD-10-CM | POA: Diagnosis not present

## 2019-05-17 DIAGNOSIS — E1142 Type 2 diabetes mellitus with diabetic polyneuropathy: Secondary | ICD-10-CM | POA: Diagnosis not present

## 2019-05-17 DIAGNOSIS — T84092D Other mechanical complication of internal right knee prosthesis, subsequent encounter: Secondary | ICD-10-CM | POA: Diagnosis not present

## 2019-05-17 DIAGNOSIS — M66261 Spontaneous rupture of extensor tendons, right lower leg: Secondary | ICD-10-CM | POA: Diagnosis not present

## 2019-05-19 DIAGNOSIS — E1142 Type 2 diabetes mellitus with diabetic polyneuropathy: Secondary | ICD-10-CM | POA: Diagnosis not present

## 2019-05-19 DIAGNOSIS — M66261 Spontaneous rupture of extensor tendons, right lower leg: Secondary | ICD-10-CM | POA: Diagnosis not present

## 2019-05-19 DIAGNOSIS — K7290 Hepatic failure, unspecified without coma: Secondary | ICD-10-CM | POA: Diagnosis not present

## 2019-05-19 DIAGNOSIS — T84092D Other mechanical complication of internal right knee prosthesis, subsequent encounter: Secondary | ICD-10-CM | POA: Diagnosis not present

## 2019-05-19 DIAGNOSIS — W19XXXD Unspecified fall, subsequent encounter: Secondary | ICD-10-CM | POA: Diagnosis not present

## 2019-05-19 DIAGNOSIS — I10 Essential (primary) hypertension: Secondary | ICD-10-CM | POA: Diagnosis not present

## 2019-05-19 DIAGNOSIS — E1165 Type 2 diabetes mellitus with hyperglycemia: Secondary | ICD-10-CM | POA: Diagnosis not present

## 2019-05-19 DIAGNOSIS — M9711XD Periprosthetic fracture around internal prosthetic right knee joint, subsequent encounter: Secondary | ICD-10-CM | POA: Diagnosis not present

## 2019-05-19 DIAGNOSIS — E7849 Other hyperlipidemia: Secondary | ICD-10-CM | POA: Diagnosis not present

## 2019-05-22 DIAGNOSIS — W19XXXD Unspecified fall, subsequent encounter: Secondary | ICD-10-CM | POA: Diagnosis not present

## 2019-05-22 DIAGNOSIS — E1142 Type 2 diabetes mellitus with diabetic polyneuropathy: Secondary | ICD-10-CM | POA: Diagnosis not present

## 2019-05-22 DIAGNOSIS — K7290 Hepatic failure, unspecified without coma: Secondary | ICD-10-CM | POA: Diagnosis not present

## 2019-05-22 DIAGNOSIS — M66261 Spontaneous rupture of extensor tendons, right lower leg: Secondary | ICD-10-CM | POA: Diagnosis not present

## 2019-05-22 DIAGNOSIS — T84092D Other mechanical complication of internal right knee prosthesis, subsequent encounter: Secondary | ICD-10-CM | POA: Diagnosis not present

## 2019-05-22 DIAGNOSIS — M9711XD Periprosthetic fracture around internal prosthetic right knee joint, subsequent encounter: Secondary | ICD-10-CM | POA: Diagnosis not present

## 2019-05-23 DIAGNOSIS — W19XXXD Unspecified fall, subsequent encounter: Secondary | ICD-10-CM | POA: Diagnosis not present

## 2019-05-23 DIAGNOSIS — M66261 Spontaneous rupture of extensor tendons, right lower leg: Secondary | ICD-10-CM | POA: Diagnosis not present

## 2019-05-23 DIAGNOSIS — E1142 Type 2 diabetes mellitus with diabetic polyneuropathy: Secondary | ICD-10-CM | POA: Diagnosis not present

## 2019-05-23 DIAGNOSIS — M9711XD Periprosthetic fracture around internal prosthetic right knee joint, subsequent encounter: Secondary | ICD-10-CM | POA: Diagnosis not present

## 2019-05-23 DIAGNOSIS — T84092D Other mechanical complication of internal right knee prosthesis, subsequent encounter: Secondary | ICD-10-CM | POA: Diagnosis not present

## 2019-05-23 DIAGNOSIS — K7290 Hepatic failure, unspecified without coma: Secondary | ICD-10-CM | POA: Diagnosis not present

## 2019-05-24 DIAGNOSIS — K7290 Hepatic failure, unspecified without coma: Secondary | ICD-10-CM | POA: Diagnosis not present

## 2019-05-24 DIAGNOSIS — E1142 Type 2 diabetes mellitus with diabetic polyneuropathy: Secondary | ICD-10-CM | POA: Diagnosis not present

## 2019-05-24 DIAGNOSIS — M9711XD Periprosthetic fracture around internal prosthetic right knee joint, subsequent encounter: Secondary | ICD-10-CM | POA: Diagnosis not present

## 2019-05-24 DIAGNOSIS — W19XXXD Unspecified fall, subsequent encounter: Secondary | ICD-10-CM | POA: Diagnosis not present

## 2019-05-24 DIAGNOSIS — T84092D Other mechanical complication of internal right knee prosthesis, subsequent encounter: Secondary | ICD-10-CM | POA: Diagnosis not present

## 2019-05-24 DIAGNOSIS — M66261 Spontaneous rupture of extensor tendons, right lower leg: Secondary | ICD-10-CM | POA: Diagnosis not present

## 2019-05-26 DIAGNOSIS — M25562 Pain in left knee: Secondary | ICD-10-CM | POA: Diagnosis not present

## 2019-05-26 DIAGNOSIS — Z96659 Presence of unspecified artificial knee joint: Secondary | ICD-10-CM | POA: Diagnosis not present

## 2019-05-26 DIAGNOSIS — M25561 Pain in right knee: Secondary | ICD-10-CM | POA: Diagnosis not present

## 2019-05-29 DIAGNOSIS — K7290 Hepatic failure, unspecified without coma: Secondary | ICD-10-CM | POA: Diagnosis not present

## 2019-05-29 DIAGNOSIS — T84092D Other mechanical complication of internal right knee prosthesis, subsequent encounter: Secondary | ICD-10-CM | POA: Diagnosis not present

## 2019-05-29 DIAGNOSIS — M66261 Spontaneous rupture of extensor tendons, right lower leg: Secondary | ICD-10-CM | POA: Diagnosis not present

## 2019-05-29 DIAGNOSIS — M9711XD Periprosthetic fracture around internal prosthetic right knee joint, subsequent encounter: Secondary | ICD-10-CM | POA: Diagnosis not present

## 2019-05-29 DIAGNOSIS — W19XXXD Unspecified fall, subsequent encounter: Secondary | ICD-10-CM | POA: Diagnosis not present

## 2019-05-29 DIAGNOSIS — E1142 Type 2 diabetes mellitus with diabetic polyneuropathy: Secondary | ICD-10-CM | POA: Diagnosis not present

## 2019-05-30 DIAGNOSIS — W19XXXD Unspecified fall, subsequent encounter: Secondary | ICD-10-CM | POA: Diagnosis not present

## 2019-05-30 DIAGNOSIS — D61818 Other pancytopenia: Secondary | ICD-10-CM | POA: Diagnosis not present

## 2019-05-30 DIAGNOSIS — K760 Fatty (change of) liver, not elsewhere classified: Secondary | ICD-10-CM | POA: Diagnosis not present

## 2019-05-30 DIAGNOSIS — K7290 Hepatic failure, unspecified without coma: Secondary | ICD-10-CM | POA: Diagnosis not present

## 2019-05-30 DIAGNOSIS — E1142 Type 2 diabetes mellitus with diabetic polyneuropathy: Secondary | ICD-10-CM | POA: Diagnosis not present

## 2019-05-30 DIAGNOSIS — T84092D Other mechanical complication of internal right knee prosthesis, subsequent encounter: Secondary | ICD-10-CM | POA: Diagnosis not present

## 2019-05-30 DIAGNOSIS — M9711XD Periprosthetic fracture around internal prosthetic right knee joint, subsequent encounter: Secondary | ICD-10-CM | POA: Diagnosis not present

## 2019-05-30 DIAGNOSIS — M66261 Spontaneous rupture of extensor tendons, right lower leg: Secondary | ICD-10-CM | POA: Diagnosis not present

## 2019-05-31 DIAGNOSIS — R300 Dysuria: Secondary | ICD-10-CM | POA: Diagnosis not present

## 2019-06-02 DIAGNOSIS — E1142 Type 2 diabetes mellitus with diabetic polyneuropathy: Secondary | ICD-10-CM | POA: Diagnosis not present

## 2019-06-02 DIAGNOSIS — M66261 Spontaneous rupture of extensor tendons, right lower leg: Secondary | ICD-10-CM | POA: Diagnosis not present

## 2019-06-02 DIAGNOSIS — W19XXXD Unspecified fall, subsequent encounter: Secondary | ICD-10-CM | POA: Diagnosis not present

## 2019-06-02 DIAGNOSIS — T84092D Other mechanical complication of internal right knee prosthesis, subsequent encounter: Secondary | ICD-10-CM | POA: Diagnosis not present

## 2019-06-02 DIAGNOSIS — M9711XD Periprosthetic fracture around internal prosthetic right knee joint, subsequent encounter: Secondary | ICD-10-CM | POA: Diagnosis not present

## 2019-06-02 DIAGNOSIS — K7290 Hepatic failure, unspecified without coma: Secondary | ICD-10-CM | POA: Diagnosis not present

## 2019-06-05 DIAGNOSIS — Z794 Long term (current) use of insulin: Secondary | ICD-10-CM | POA: Diagnosis not present

## 2019-06-05 DIAGNOSIS — M9711XD Periprosthetic fracture around internal prosthetic right knee joint, subsequent encounter: Secondary | ICD-10-CM | POA: Diagnosis not present

## 2019-06-05 DIAGNOSIS — M66261 Spontaneous rupture of extensor tendons, right lower leg: Secondary | ICD-10-CM | POA: Diagnosis not present

## 2019-06-05 DIAGNOSIS — W19XXXD Unspecified fall, subsequent encounter: Secondary | ICD-10-CM | POA: Diagnosis not present

## 2019-06-05 DIAGNOSIS — F418 Other specified anxiety disorders: Secondary | ICD-10-CM | POA: Diagnosis not present

## 2019-06-05 DIAGNOSIS — K7290 Hepatic failure, unspecified without coma: Secondary | ICD-10-CM | POA: Diagnosis not present

## 2019-06-05 DIAGNOSIS — Z9181 History of falling: Secondary | ICD-10-CM | POA: Diagnosis not present

## 2019-06-05 DIAGNOSIS — D61818 Other pancytopenia: Secondary | ICD-10-CM | POA: Diagnosis not present

## 2019-06-05 DIAGNOSIS — Z792 Long term (current) use of antibiotics: Secondary | ICD-10-CM | POA: Diagnosis not present

## 2019-06-05 DIAGNOSIS — Z79891 Long term (current) use of opiate analgesic: Secondary | ICD-10-CM | POA: Diagnosis not present

## 2019-06-05 DIAGNOSIS — G40909 Epilepsy, unspecified, not intractable, without status epilepticus: Secondary | ICD-10-CM | POA: Diagnosis not present

## 2019-06-05 DIAGNOSIS — E1142 Type 2 diabetes mellitus with diabetic polyneuropathy: Secondary | ICD-10-CM | POA: Diagnosis not present

## 2019-06-05 DIAGNOSIS — E785 Hyperlipidemia, unspecified: Secondary | ICD-10-CM | POA: Diagnosis not present

## 2019-06-05 DIAGNOSIS — I119 Hypertensive heart disease without heart failure: Secondary | ICD-10-CM | POA: Diagnosis not present

## 2019-06-05 DIAGNOSIS — Z87440 Personal history of urinary (tract) infections: Secondary | ICD-10-CM | POA: Diagnosis not present

## 2019-06-05 DIAGNOSIS — K760 Fatty (change of) liver, not elsewhere classified: Secondary | ICD-10-CM | POA: Diagnosis not present

## 2019-06-05 DIAGNOSIS — T84092D Other mechanical complication of internal right knee prosthesis, subsequent encounter: Secondary | ICD-10-CM | POA: Diagnosis not present

## 2019-06-07 DIAGNOSIS — T84092D Other mechanical complication of internal right knee prosthesis, subsequent encounter: Secondary | ICD-10-CM | POA: Diagnosis not present

## 2019-06-07 DIAGNOSIS — M66261 Spontaneous rupture of extensor tendons, right lower leg: Secondary | ICD-10-CM | POA: Diagnosis not present

## 2019-06-07 DIAGNOSIS — M9711XD Periprosthetic fracture around internal prosthetic right knee joint, subsequent encounter: Secondary | ICD-10-CM | POA: Diagnosis not present

## 2019-06-07 DIAGNOSIS — R05 Cough: Secondary | ICD-10-CM | POA: Diagnosis not present

## 2019-06-07 DIAGNOSIS — E1142 Type 2 diabetes mellitus with diabetic polyneuropathy: Secondary | ICD-10-CM | POA: Diagnosis not present

## 2019-06-07 DIAGNOSIS — K7290 Hepatic failure, unspecified without coma: Secondary | ICD-10-CM | POA: Diagnosis not present

## 2019-06-07 DIAGNOSIS — W19XXXD Unspecified fall, subsequent encounter: Secondary | ICD-10-CM | POA: Diagnosis not present

## 2019-06-07 DIAGNOSIS — J019 Acute sinusitis, unspecified: Secondary | ICD-10-CM | POA: Diagnosis not present

## 2019-06-08 DIAGNOSIS — I10 Essential (primary) hypertension: Secondary | ICD-10-CM | POA: Diagnosis not present

## 2019-06-08 DIAGNOSIS — E782 Mixed hyperlipidemia: Secondary | ICD-10-CM | POA: Diagnosis not present

## 2019-06-08 DIAGNOSIS — K219 Gastro-esophageal reflux disease without esophagitis: Secondary | ICD-10-CM | POA: Diagnosis not present

## 2019-06-08 DIAGNOSIS — R945 Abnormal results of liver function studies: Secondary | ICD-10-CM | POA: Diagnosis not present

## 2019-06-08 DIAGNOSIS — E1165 Type 2 diabetes mellitus with hyperglycemia: Secondary | ICD-10-CM | POA: Diagnosis not present

## 2019-06-08 DIAGNOSIS — E871 Hypo-osmolality and hyponatremia: Secondary | ICD-10-CM | POA: Diagnosis not present

## 2019-06-08 DIAGNOSIS — D649 Anemia, unspecified: Secondary | ICD-10-CM | POA: Diagnosis not present

## 2019-06-09 DIAGNOSIS — M66261 Spontaneous rupture of extensor tendons, right lower leg: Secondary | ICD-10-CM | POA: Diagnosis not present

## 2019-06-09 DIAGNOSIS — T84092D Other mechanical complication of internal right knee prosthesis, subsequent encounter: Secondary | ICD-10-CM | POA: Diagnosis not present

## 2019-06-09 DIAGNOSIS — K7290 Hepatic failure, unspecified without coma: Secondary | ICD-10-CM | POA: Diagnosis not present

## 2019-06-09 DIAGNOSIS — M9711XD Periprosthetic fracture around internal prosthetic right knee joint, subsequent encounter: Secondary | ICD-10-CM | POA: Diagnosis not present

## 2019-06-09 DIAGNOSIS — W19XXXD Unspecified fall, subsequent encounter: Secondary | ICD-10-CM | POA: Diagnosis not present

## 2019-06-09 DIAGNOSIS — E1142 Type 2 diabetes mellitus with diabetic polyneuropathy: Secondary | ICD-10-CM | POA: Diagnosis not present

## 2019-06-10 DIAGNOSIS — R05 Cough: Secondary | ICD-10-CM | POA: Diagnosis not present

## 2019-06-10 DIAGNOSIS — R0602 Shortness of breath: Secondary | ICD-10-CM | POA: Diagnosis not present

## 2019-06-12 DIAGNOSIS — K7581 Nonalcoholic steatohepatitis (NASH): Secondary | ICD-10-CM | POA: Diagnosis not present

## 2019-06-12 DIAGNOSIS — E1165 Type 2 diabetes mellitus with hyperglycemia: Secondary | ICD-10-CM | POA: Diagnosis not present

## 2019-06-12 DIAGNOSIS — I10 Essential (primary) hypertension: Secondary | ICD-10-CM | POA: Diagnosis not present

## 2019-06-12 DIAGNOSIS — D696 Thrombocytopenia, unspecified: Secondary | ICD-10-CM | POA: Diagnosis not present

## 2019-06-12 DIAGNOSIS — M66261 Spontaneous rupture of extensor tendons, right lower leg: Secondary | ICD-10-CM | POA: Diagnosis not present

## 2019-06-12 DIAGNOSIS — T84012A Broken internal right knee prosthesis, initial encounter: Secondary | ICD-10-CM | POA: Diagnosis not present

## 2019-06-12 DIAGNOSIS — E782 Mixed hyperlipidemia: Secondary | ICD-10-CM | POA: Diagnosis not present

## 2019-06-12 DIAGNOSIS — E1142 Type 2 diabetes mellitus with diabetic polyneuropathy: Secondary | ICD-10-CM | POA: Diagnosis not present

## 2019-06-12 DIAGNOSIS — W19XXXD Unspecified fall, subsequent encounter: Secondary | ICD-10-CM | POA: Diagnosis not present

## 2019-06-12 DIAGNOSIS — T84092D Other mechanical complication of internal right knee prosthesis, subsequent encounter: Secondary | ICD-10-CM | POA: Diagnosis not present

## 2019-06-12 DIAGNOSIS — M9711XD Periprosthetic fracture around internal prosthetic right knee joint, subsequent encounter: Secondary | ICD-10-CM | POA: Diagnosis not present

## 2019-06-12 DIAGNOSIS — K7290 Hepatic failure, unspecified without coma: Secondary | ICD-10-CM | POA: Diagnosis not present

## 2019-06-12 DIAGNOSIS — F329 Major depressive disorder, single episode, unspecified: Secondary | ICD-10-CM | POA: Diagnosis not present

## 2019-06-13 DIAGNOSIS — R300 Dysuria: Secondary | ICD-10-CM | POA: Diagnosis not present

## 2019-06-14 DIAGNOSIS — E1142 Type 2 diabetes mellitus with diabetic polyneuropathy: Secondary | ICD-10-CM | POA: Diagnosis not present

## 2019-06-14 DIAGNOSIS — M66261 Spontaneous rupture of extensor tendons, right lower leg: Secondary | ICD-10-CM | POA: Diagnosis not present

## 2019-06-14 DIAGNOSIS — M9711XD Periprosthetic fracture around internal prosthetic right knee joint, subsequent encounter: Secondary | ICD-10-CM | POA: Diagnosis not present

## 2019-06-14 DIAGNOSIS — T84092D Other mechanical complication of internal right knee prosthesis, subsequent encounter: Secondary | ICD-10-CM | POA: Diagnosis not present

## 2019-06-14 DIAGNOSIS — W19XXXD Unspecified fall, subsequent encounter: Secondary | ICD-10-CM | POA: Diagnosis not present

## 2019-06-14 DIAGNOSIS — K7290 Hepatic failure, unspecified without coma: Secondary | ICD-10-CM | POA: Diagnosis not present

## 2019-06-16 NOTE — Patient Instructions (Addendum)
DUE TO COVID-19 ONLY ONE VISITOR IS ALLOWED TO COME WITH YOU AND STAY IN THE WAITING ROOM ONLY DURING PRE OP AND PROCEDURE DAY OF SURGERY. THE 2 VISITOR MAY VISIT WITH YOU AFTER SURGERY IN YOUR PRIVATE ROOM DURING VISITING HOURS ONLY!  10 am-8pm  YOU NEED TO HAVE A COVID 19 TEST ON___4-5-21____ @__1 :00 pm_____, THIS TEST MUST BE DONE BEFORE SURGERY, COME  To [place] INSTRUCTIONS AS OUTLINED IN YOUR HANDOUT.  ONCE YOUR COVID TEST IS COMPLETED,                 DEZARAE MCCLARAN  06/16/2019   Your procedure is scheduled on: 06-29-19    Report to [facility] Main  Entrance   Report to admitting at       0640  AM     Call this number if you have problems the morning of surgery [phone]    Remember:   NO SOLID FOOD AFTER MIDNIGHT THE NIGHT PRIOR TO SURGERY. NOTHING BY MOUTH EXCEPT CLEAR LIQUIDS UNTIL   0540 am. PLEASE FINISH Gatorade  DRINK PER SURGEON ORDER  WHICH NEEDS TO BE COMPLETED AT      0540 am then nothing by mouth.    CLEAR LIQUID DIET   Foods Allowed                                                                     Foods Excluded  Coffee and tea, regular and decaf                                 liquids that you cannot  Plain Jell-O any favor except red or purple                                           see through such as: Fruit ices (not with fruit pulp)                                                  milk, soups, orange juice  Iced Popsicles                                                           All solid food Carbonated beverages, regular and diet                                    Cranberry, grape and apple juices Sports drinks like Gatorade Lightly seasoned clear broth or consume(fat free) Sugar, honey syrup  _____________________________________________________________________    BRUSH YOUR TEETH MORNING OF SURGERY AND RINSE YOUR MOUTH OUT, NO CHEWING GUM CANDY OR MINTS.  Take these medicines the morning of  surgery with A SIP OF WATER: rifaxmin, loratadine, lexapro, gabapentin, wellbutrin, atenelol, xanax if needed, inhaler and bring with you  DO NOT TAKE ANY DIABETIC MEDICATIONS DAY OF YOUR SURGERY   the day before surgery only take morning dose of glipizide The day before surgery only take 50% of your normal insulin dose and 50% of your normal dose day of surgery if you take in the am if you take at night do not take any day of surgery   Janumet take as usual day befor surgery NONE Day of surgery                            You may not have any metal on your body including hair pins and              piercings  Do not wear jewelry, make-up, lotions, powders or perfumes, deodorant             Do not wear nail polish on your fingernails.  Do not shave  48 hours prior to surgery.             Do not bring valuables to the hospital. [place].  Contacts, dentures or bridgework may not be worn into surgery.  Leave suitcase in the car. After surgery it may be brought to your room.                  Please read over the following fact sheets you were given: _____________________________________________________________________           [facility] - Preparing for Surgery Before surgery, you can play an important role.  Because skin is not sterile, your skin needs to be as free of germs as possible.  You can reduce the number of germs on your skin by washing with CHG (chlorahexidine gluconate) soap before surgery.  CHG is an antiseptic cleaner which kills germs and bonds with the skin to continue killing germs even after washing. Please DO NOT use if you have an allergy to CHG or antibacterial soaps.  If your skin becomes reddened/irritated stop using the CHG and inform your nurse when you arrive at Short Stay. Do not shave (including legs and underarms) for at least 48 hours prior to the first CHG shower.  You may shave your face/neck. Please follow  these instructions carefully:  1.  Shower with CHG Soap the night before surgery and the  morning of Surgery.  2.  If you choose to wash your hair, wash your hair first as usual with your  normal  shampoo.  3.  After you shampoo, rinse your hair and body thoroughly to remove the  shampoo.                           4.  Use CHG as you would any other liquid soap.  You can apply chg directly  to the skin and wash                       Gently with a scrungie or clean washcloth.  5.  Apply the CHG Soap to your body ONLY FROM THE NECK DOWN.   Do not use on face/ open  Wound or open sores. Avoid contact with eyes, ears mouth and genitals (private parts).                       Wash face,  Genitals (private parts) with your normal soap.             6.  Wash thoroughly, paying special attention to the area where your surgery  will be performed.  7.  Thoroughly rinse your body with warm water from the neck down.  8.  DO NOT shower/wash with your normal soap after using and rinsing off  the CHG Soap.                9.  Pat yourself dry with a clean towel.            10.  Wear clean pajamas.            11.  Place clean sheets on your bed the night of your first shower and do not  sleep with pets. Day of Surgery : Do not apply any lotions/deodorants the morning of surgery.  Please wear clean clothes to the hospital/surgery center.  FAILURE TO FOLLOW THESE INSTRUCTIONS MAY RESULT IN THE CANCELLATION OF YOUR SURGERY PATIENT SIGNATURE_________________________________  NURSE SIGNATURE__________________________________  ________________________________________________________________________ How to Manage Your Diabetes Before and After Surgery  Why is it important to control my blood sugar before and after surgery? . Improving blood sugar levels before and after surgery helps healing and can limit problems. . A way of improving blood sugar control is eating a healthy diet by: o   Eating less sugar and carbohydrates o  Increasing activity/exercise o  Talking with your doctor about reaching your blood sugar goals . High blood sugars (greater than 180 mg/dL) can raise your risk of infections and slow your recovery, so you will need to focus on controlling your diabetes during the weeks before surgery. . Make sure that the doctor who takes care of your diabetes knows about your planned surgery including the date and location.  How do I manage my blood sugar before surgery? . Check your blood sugar at least 4 times a day, starting 2 days before surgery, to make sure that the level is not too high or low. o Check your blood sugar the morning of your surgery when you wake up and every 2 hours until you get to the Short Stay unit. . If your blood sugar is less than 70 mg/dL, you will need to treat for low blood sugar: o Do not take insulin. o Treat a low blood sugar (less than 70 mg/dL) with  cup of clear juice (cranberry or apple), 4 glucose tablets, OR glucose gel. o Recheck blood sugar in 15 minutes after treatment (to make sure it is greater than 70 mg/dL). If your blood sugar is not greater than 70 mg/dL on recheck, call [phone] for further instructions. . Report your blood sugar to the short stay nurse when you get to Short Stay.  . If you are admitted to the hospital after surgery: o Your blood sugar will be checked by the staff and you will probably be given insulin after surgery (instead of oral diabetes medicines) to make sure you have good blood sugar levels. o The goal for blood sugar control after surgery is 80-180 mg/dL.   WHAT DO I DO ABOUT MY DIABETES MEDICATION?  [name] Do not take oral diabetes medicines (pills) the morning of surgery.  Incentive Spirometer  An incentive spirometer is a tool that can help keep your lungs clear and active. This tool measures how well you are filling your lungs with each breath. Taking long deep breaths may help reverse  or decrease the chance of developing breathing (pulmonary) problems (especially infection) following:  A long period of time when you are unable to move or be active. BEFORE THE PROCEDURE   If the spirometer includes an indicator to show your best effort, your nurse or respiratory therapist will set it to a desired goal.  If possible, sit up straight or lean slightly forward. Try not to slouch.  Hold the incentive spirometer in an upright position. INSTRUCTIONS FOR USE  1. Sit on the edge of your bed if possible, or sit up as far as you can in bed or on a chair. 2. Hold the incentive spirometer in an upright position. 3. Breathe out normally. 4. Place the mouthpiece in your mouth and seal your lips tightly around it. 5. Breathe in slowly and as deeply as possible, raising the piston or the ball toward the top of the column. 6. Hold your breath for 3-5 seconds or for as long as possible. Allow the piston or ball to fall to the bottom of the column. 7. Remove the mouthpiece from your mouth and breathe out normally. 8. Rest for a few seconds and repeat Steps 1 through 7 at least 10 times every 1-2 hours when you are awake. Take your time and take a few normal breaths between deep breaths. 9. The spirometer may include an indicator to show your best effort. Use the indicator as a goal to work toward during each repetition. 10. After each set of 10 deep breaths, practice coughing to be sure your lungs are clear. If you have an incision (the cut made at the time of surgery), support your incision when coughing by placing a pillow or rolled up towels firmly against it. Once you are able to get out of bed, walk around indoors and cough well. You may stop using the incentive spirometer when instructed by your caregiver.  RISKS AND COMPLICATIONS  Take your time so you do not get dizzy or light-headed.  If you are in pain, you may need to take or ask for pain medication before doing incentive  spirometry. It is harder to take a deep breath if you are having pain. AFTER USE  Rest and breathe slowly and easily.  It can be helpful to keep track of a log of your progress. Your caregiver can provide you with a simple table to help with this. If you are using the spirometer at home, follow these instructions: [place] IF:   You are having difficultly using the spirometer.  You have trouble using the spirometer as often as instructed.  Your pain medication is not giving enough relief while using the spirometer.  You develop fever of 100.5 F (38.1 C) or higher. SEEK IMMEDIATE MEDICAL CARE IF:   You cough up bloody sputum that had not been present before.  You develop fever of 102 F (38.9 C) or greater.  You develop worsening pain at or near the incision site. MAKE SURE YOU:   Understand these instructions.  Will watch your condition.  Will get help right away if you are not doing well or get worse. Document Released: 07/20/2006 Document Revised: 06/01/2011 Document Reviewed: 09/20/2006 ExitCare Patient Information 2014 ExitCare, [place].   ________________________________________________________________________  WHAT IS A BLOOD TRANSFUSION? Blood  Transfusion Information  A transfusion is the replacement of blood or some of its parts. Blood is made up of multiple cells which provide different functions.  Red blood cells carry oxygen and are used for blood loss replacement.  White blood cells fight against infection.  Platelets control bleeding.  Plasma helps clot blood.  Other blood products are available for specialized needs, such as hemophilia or other clotting disorders. BEFORE THE TRANSFUSION  Who gives blood for transfusions?   Healthy volunteers who are fully evaluated to make sure their blood is safe. This is blood bank blood. Transfusion therapy is the safest it has ever been in the practice of medicine. Before blood is taken from a donor, a  complete history is taken to make sure that person has no history of diseases nor engages in risky social behavior (examples are intravenous drug use or sexual activity with multiple partners). The donor's travel history is screened to minimize risk of transmitting infections, such as malaria. The donated blood is tested for signs of infectious diseases, such as HIV and hepatitis. The blood is then tested to be sure it is compatible with you in order to minimize the chance of a transfusion reaction. If you or a relative donates blood, this is often done in anticipation of surgery and is not appropriate for emergency situations. It takes many days to process the donated blood. RISKS AND COMPLICATIONS Although transfusion therapy is very safe and saves many lives, the main dangers of transfusion include:   Getting an infectious disease.  Developing a transfusion reaction. This is an allergic reaction to something in the blood you were given. Every precaution is taken to prevent this. The decision to have a blood transfusion has been considered carefully by your caregiver before blood is given. Blood is not given unless the benefits outweigh the risks. AFTER THE TRANSFUSION  Right after receiving a blood transfusion, you will usually feel much better and more energetic. This is especially true if your red blood cells have gotten low (anemic). The transfusion raises the level of the red blood cells which carry oxygen, and this usually causes an energy increase.  The nurse administering the transfusion will monitor you carefully for complications. HOME CARE INSTRUCTIONS  No special instructions are needed after a transfusion. You may find your energy is better. Speak with your caregiver about any limitations on activity for underlying diseases you may have. SEEK MEDICAL CARE IF:   Your condition is not improving after your transfusion.  You develop redness or irritation at the intravenous (IV)  site. SEEK IMMEDIATE MEDICAL CARE IF:  Any of the following symptoms occur over the next 12 hours:  Shaking chills.  You have a temperature by mouth above 102 F (38.9 C), not controlled by medicine.  Chest, back, or muscle pain.  People around you feel you are not acting correctly or are confused.  Shortness of breath or difficulty breathing.  Dizziness and fainting.  You get a rash or develop hives.  You have a decrease in urine output.  Your urine turns a dark color or changes to pink, red, or brown. Any of the following symptoms occur over the next 10 days:  You have a temperature by mouth above 102 F (38.9 C), not controlled by medicine.  Shortness of breath.  Weakness after normal activity.  The white part of the eye turns yellow (jaundice).  You have a decrease in the amount of urine or are urinating less often.  Your urine  turns a dark color or changes to pink, red, or brown. Document Released: 03/06/2000 Document Revised: 06/01/2011 Document Reviewed: 10/24/2007 [facility] Patient Information 2014 [place], [place].  _______________________________________________________________________

## 2019-06-16 NOTE — Progress Notes (Signed)
PCP - Clemmie Krill PA-C Cardiologist - lov Dr. Harl Bowie 01-20-19 epic  Chest x-ray -  EKG - 12-29-18 epic Stress Test -  ECHO - 2020 epic Cardiac Cath -   Sleep Study -  CPAP -   Fasting Blood Sugar -  Checks Blood Sugar _____ times a day  Blood Thinner Instructions: Aspirin Instructions: Last Dose:  Anesthesia review: cirrhosis  Patient denies shortness of breath, fever, cough and chest pain at PAT appointment  none   Patient verbalized understanding of instructions that were given to them at the PAT appointment. Patient was also instructed that they will need to review over the PAT instructions again at home before surgery.

## 2019-06-19 ENCOUNTER — Other Ambulatory Visit: Payer: Self-pay

## 2019-06-19 ENCOUNTER — Encounter (HOSPITAL_COMMUNITY)
Admission: RE | Admit: 2019-06-19 | Discharge: 2019-06-19 | Disposition: A | Discharge status: Home / Self Care | Payer: Medicare Other | Source: Ambulatory Visit | Attending: Orthopedic Surgery | Admitting: Orthopedic Surgery

## 2019-06-19 ENCOUNTER — Encounter (HOSPITAL_COMMUNITY): Payer: Self-pay

## 2019-06-19 DIAGNOSIS — Z01812 Encounter for preprocedural laboratory examination: Secondary | ICD-10-CM | POA: Diagnosis not present

## 2019-06-19 HISTORY — DX: Unspecified asthma, uncomplicated: J45.909

## 2019-06-20 ENCOUNTER — Encounter (HOSPITAL_COMMUNITY)
Admission: RE | Admit: 2019-06-20 | Discharge: 2019-06-20 | Disposition: A | Discharge status: Home / Self Care | Payer: Medicare Other | Source: Ambulatory Visit | Attending: Orthopedic Surgery | Admitting: Orthopedic Surgery

## 2019-06-20 DIAGNOSIS — Z01812 Encounter for preprocedural laboratory examination: Secondary | ICD-10-CM | POA: Insufficient documentation

## 2019-06-20 LAB — CBC
HCT: 38.5 % (ref 36.0–46.0)
Hemoglobin: 12.1 g/dL (ref 12.0–15.0)
MCH: 29.7 pg (ref 26.0–34.0)
MCHC: 31.4 g/dL (ref 30.0–36.0)
MCV: 94.6 fL (ref 80.0–100.0)
Platelets: 74 10*3/uL — ABNORMAL LOW (ref 150–400)
RBC: 4.07 MIL/uL (ref 3.87–5.11)
RDW: 18.6 % — ABNORMAL HIGH (ref 11.5–15.5)
WBC: 3.9 10*3/uL — ABNORMAL LOW (ref 4.0–10.5)
nRBC: 0 % (ref 0.0–0.2)

## 2019-06-20 LAB — BASIC METABOLIC PANEL
Anion gap: 11 (ref 5–15)
BUN: 9 mg/dL (ref 8–23)
CO2: 27 mmol/L (ref 22–32)
Calcium: 8.9 mg/dL (ref 8.9–10.3)
Chloride: 103 mmol/L (ref 98–111)
Creatinine, Ser: 0.63 mg/dL (ref 0.44–1.00)
GFR calc Af Amer: >60 mL/min (ref >60)
GFR calc non Af Amer: >60 mL/min (ref >60)
Glucose, Bld: 123 mg/dL — ABNORMAL HIGH (ref 70–99)
Potassium: 3.9 mmol/L (ref 3.5–5.1)
Sodium: 141 mmol/L (ref 135–145)

## 2019-06-20 LAB — HEMOGLOBIN A1C
Hgb A1c MFr Bld: 5.3 % (ref 4.8–5.6)
Mean Plasma Glucose: 105.41 mg/dL

## 2019-06-20 LAB — GLUCOSE, CAPILLARY: Glucose-Capillary: 109 mg/dL — ABNORMAL HIGH (ref 70–99)

## 2019-06-21 DIAGNOSIS — E1165 Type 2 diabetes mellitus with hyperglycemia: Secondary | ICD-10-CM | POA: Diagnosis not present

## 2019-06-21 DIAGNOSIS — I10 Essential (primary) hypertension: Secondary | ICD-10-CM | POA: Diagnosis not present

## 2019-06-22 DIAGNOSIS — R300 Dysuria: Secondary | ICD-10-CM | POA: Diagnosis not present

## 2019-06-22 NOTE — Progress Notes (Signed)
Anesthesia Chart Review   Case: [id number] Date/Time: 06/29/19 0825   Procedure: AMPUTATION ABOVE KNEE through knee (Right Knee) - 2 hrs   Anesthesia type: Spinal   Pre-op diagnosis: Failed right knee, status post resection of total knee arthroplasty, chronic infection   Location: [facility]OM 10 / [facility]   Surgeons: Paralee Cancel, MD      DISCUSSION:73 y.o. never smoker with h/o HTN, seizures (last seizure 06/07/2014), asthma, DM II, HLD, NASH liver cirrhosis, thrombocytopenia (Platelets 74), mild aortic stenosis (mean gradient 10 mmHg, 1.54 cm2), failed right knee, s/p resection of total knee arthroplasty, chronic infection scheduled for above procedure 06/29/2019 with Dr. Paralee Cancel.   Pt last seen by cardiologist, Dr. Carlyle Dolly, 01/20/2019.  Per OV note in regards to chest pain, "symptoms improved, recent stress test without ischemia, no further workup at this time."  S/p excisional total knee arthroplasty with no anesthesia complications noted.    Anticipate pt can proceed with planned procedure barring acute status change and after evaluation DOS.  VS: BP (!) 201/69 Comment: Notified PA abour B/P  Pulse 67   Temp 36.8 C (Oral)   Resp 18   Ht 5\' 5"  (1.651 m)   Wt 76.7 kg   SpO2 99%   BMI 28.12 kg/m   PROVIDERS: Rosalee Kaufman, PA-C is PCP   Carlyle Dolly, MD is Cardiologist  LABS: labs forwarded to Dr. Alvan Dame (all labs ordered are listed, but only abnormal results are displayed)  Labs Reviewed  BASIC METABOLIC PANEL - Abnormal; Notable for the following components:      Result Value   Glucose, Bld 123 (*)    All other components within normal limits  CBC - Abnormal; Notable for the following components:   WBC 3.9 (*)    RDW 18.6 (*)    Platelets 74 (*)    All other components within normal limits  GLUCOSE, CAPILLARY - Abnormal; Notable for the following components:   Glucose-Capillary 109 (*)    All other components within normal limits  HEMOGLOBIN A1C   TYPE AND SCREEN     IMAGES:   EKG: 12/28/2018 Rate 49 bpm Bradycardia with irregular rate Prolonged PR interval  Left ventricular hypertrophy Anterior infarct, old  CV: Echo 10/12/2018 IMPRESSIONS    1. The left ventricle has normal systolic function with an ejection  fraction of 60-65%. The cavity size was normal. There is mildly increased  left ventricular wall thickness. Left ventricular diastolic Doppler  parameters are consistent with impaired  relaxation. No evidence of left ventricular regional wall motion  abnormalities.  2. The right ventricle has normal systolic function. The cavity was  normal. There is no increase in right ventricular wall thickness.  3. The aortic valve is tricuspid. Mild thickening of the aortic valve.  Mild calcification of the aortic valve. Aortic valve regurgitation is mild  by color flow Doppler. Mild stenosis of the aortic valve.  4. Peak aortic velocity: 2.36m/s, mean gradient 33mmHg.  5. The aorta is normal in size and structure.   Stress Test 09/13/2017  T wave inversion was noted during stress in the II, III, aVF, V5 and V6 leads.  The study is normal.  This is a low risk study.  The left ventricular ejection fraction is normal (55-65%). Past Medical History:  Diagnosis Date  . Acute and subacute hepatic failure without coma   . Allergy   . Anasarca   . Anxiety   . Arthritis   . Ascites 09/2018  no SBP on 850 cc tap 10/13/18  . Asthma    allery induced  . Cirrhosis of liver ([place]) ~ 2004   from NAFLD.  Dx ~ 2004.  Dr Dorcas Mcmurray at [facility]/    . Depression   . Diabetes mellitus without complication ([place])    type 2  . Eczema of both hands   . Endometrial cancer ([place]) 1996, 2003   hysterectomy 1996.  recurrence 2003, treated with radiation.    . Generalized edema   . GI hemorrhage   . 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 in [place]  . Hyperlipidemia    . Hypertension   . Muscle weakness   . Seizures ([place])    last seizure June 07, 2014   . Thrombocytopenia ([place])   . Thyroid nodule     Past Surgical History:  Procedure Laterality Date  . CATARACT EXTRACTION W/PHACO Left 06/04/2017   Procedure: CATARACT EXTRACTION PHACO AND INTRAOCULAR LENS PLACEMENT (IOC);  Surgeon: Baruch Goldmann, MD;  Location: [facility];  Service: Ophthalmology;  Laterality: Left;  CDE: 3.90  . CATARACT EXTRACTION W/PHACO Right 07/15/2017   Procedure: CATARACT EXTRACTION PHACO AND INTRAOCULAR LENS PLACEMENT (IOC);  Surgeon: Baruch Goldmann, MD;  Location: [facility];  Service: Ophthalmology;  Laterality: Right;  CDE: 7.60  . COLONOSCOPY  06/2007   in [place]. for hematochzia.  radiation proctitis  . DILATION AND CURETTAGE OF UTERUS    . ESOPHAGOGASTRODUODENOSCOPY  2016  . ESOPHAGOGASTRODUODENOSCOPY (EGD) WITH PROPOFOL N/A 12/30/2018   Procedure: EGD;  Surgeon: Clarene Essex, MD;  [facility];  Service: Endoscopy;  Laterality: N/A;  . EXCISIONAL TOTAL KNEE ARTHROPLASTY Right 01/03/2019   Procedure: EXCISIONAL TOTAL KNEE ARTHROPLASTY;  Surgeon: Paralee Cancel, MD;  [facility];  Service: Orthopedics;  Laterality: Right;  . FLEXIBLE SIGMOIDOSCOPY N/A 12/30/2018   Procedure: FLEXIBLE SIGMOIDOSCOPY;  Surgeon: Clarene Essex, MD;  [facility];  Service: Endoscopy;  Laterality: N/A;  . IR PARACENTESIS  10/13/2018  . TOTAL ABDOMINAL HYSTERECTOMY  1996  . TOTAL KNEE ARTHROPLASTY Right 09/22/2018   Procedure: TOTAL KNEE ARTHROPLASTY;  Surgeon: Paralee Cancel, MD;  [facility];  Service: Orthopedics;  Laterality: Right;  70 mins  . TOTAL KNEE REVISION Right 09/27/2018   Procedure: TOTAL KNEE REVISION;  Surgeon: Paralee Cancel, MD;  [facility];  Service: Orthopedics;  Laterality: Right;  . TOTAL KNEE REVISION Right 11/22/2018   Procedure: repair right knee extensor mechanism;  Surgeon: Paralee Cancel, MD;  [facility];  Service: Orthopedics;  Laterality: Right;   90 mins    MEDICATIONS: . albuterol (VENTOLIN HFA) 108 (90 Base) MCG/ACT inhaler  . ALPRAZolam (XANAX) 0.25 MG tablet  . Amino Acids-Protein Hydrolys (FEEDING SUPPLEMENT, PRO-STAT SUGAR FREE 64,) LIQD  . atenolol (TENORMIN) 50 MG tablet  . atorvastatin (LIPITOR) 80 MG tablet  . buPROPion (WELLBUTRIN XL) 150 MG 24 hr tablet  . docusate sodium (COLACE) 100 MG capsule  . escitalopram (LEXAPRO) 20 MG tablet  . ferrous sulfate 325 (65 FE) MG tablet  . fluticasone (FLONASE) 50 MCG/ACT nasal spray  . gabapentin (NEURONTIN) 300 MG capsule  . glipiZIDE (GLUCOTROL) 5 MG tablet  . glucagon (GLUCAGON EMERGENCY) 1 MG injection  . HYDROcodone-acetaminophen (NORCO) 5-325 MG tablet  . insulin aspart (NOVOLOG) 100 UNIT/ML injection  . Insulin Glargine (BASAGLAR KWIKPEN) 100 UNIT/ML  . insulin glargine (LANTUS) 100 UNIT/ML injection  . lactulose (CHRONULAC) 10 GM/15ML solution  . loratadine (CLARITIN) 10 MG  tablet  . methocarbamol (ROBAXIN) 500 MG tablet  . Multiple Vitamin (MULTIVITAMIN WITH MINERALS) TABS tablet  . ondansetron (ZOFRAN) 4 MG tablet  . rifaximin (XIFAXAN) 550 MG TABS tablet  . sitaGLIPtin-metformin (JANUMET) 50-500 MG per tablet  . traZODone (DESYREL) 50 MG tablet   No current facility-administered medications for this encounter.    Maia Plan [facility] Pre-Surgical Testing [phone] 06/22/19  12:37 PM

## 2019-06-26 ENCOUNTER — Other Ambulatory Visit: Payer: Self-pay

## 2019-06-26 ENCOUNTER — Other Ambulatory Visit (HOSPITAL_COMMUNITY)
Admission: RE | Admit: 2019-06-26 | Discharge: 2019-06-26 | Disposition: A | Discharge status: Home / Self Care | Payer: Medicare Other | Source: Ambulatory Visit | Attending: Orthopedic Surgery | Admitting: Orthopedic Surgery

## 2019-06-26 DIAGNOSIS — Z01812 Encounter for preprocedural laboratory examination: Secondary | ICD-10-CM | POA: Insufficient documentation

## 2019-06-26 DIAGNOSIS — Z20822 Contact with and (suspected) exposure to covid-19: Secondary | ICD-10-CM | POA: Insufficient documentation

## 2019-06-26 LAB — SARS CORONAVIRUS 2 (TAT 6-24 HRS): SARS Coronavirus 2: NEGATIVE

## 2019-06-28 NOTE — Anesthesia Preprocedure Evaluation (Addendum)
Anesthesia Evaluation  Patient identified by MRN, date of birth, ID band Patient awake    Reviewed: Allergy & Precautions, NPO status , Patient's Chart, lab work & pertinent test results  History of Anesthesia Complications Negative for: history of anesthetic complications  Airway Mallampati: II  TM Distance: >3 FB Neck ROM: Full    Dental  (+) Poor Dentition, Dental Advisory Given,    Pulmonary neg recent URI,    Pulmonary exam normal        Cardiovascular hypertension, Pt. on medications Normal cardiovascular exam  Pt last seen by cardiologist, Dr. Carlyle Dolly, 01/20/2019.  Per OV note in regards to chest pain, "symptoms improved, recent stress test without ischemia, no further workup at this time."    Neuro/Psych PSYCHIATRIC DISORDERS Anxiety Depression    GI/Hepatic (+) Cirrhosis   ascites    ,   Endo/Other  diabetes, Insulin Dependent, Oral Hypoglycemic AgentsMorbid obesity  Renal/GU Renal disease     Musculoskeletal  (+) Arthritis , FAILED RIGHT KNEE   Abdominal   Peds  Hematology  (+) Blood dyscrasia, anemia , Thrombocytopenia    Anesthesia Other Findings   Reproductive/Obstetrics                            Anesthesia Physical  Anesthesia Plan  ASA: III  Anesthesia Plan: General   Post-op Pain Management:  Regional for Post-op pain   Induction: Intravenous  PONV Risk Score and Plan: 3 and Ondansetron and Dexamethasone  Airway Management Planned: Oral ETT  Additional Equipment: None  Intra-op Plan:   Post-operative Plan: Extubation in OR  Informed Consent: I have reviewed the patients History and Physical, chart, labs and discussed the procedure including the risks, benefits and alternatives for the proposed anesthesia with the patient or authorized representative who has indicated his/her understanding and acceptance.     Dental advisory given  Plan  Discussed with: CRNA and Anesthesiologist  Anesthesia Plan Comments:        Anesthesia Quick Evaluation

## 2019-06-28 NOTE — H&P (Signed)
TOTAL KNEE ADMISSION H&P  Patient is being admitted for right above the knee amputation  Subjective:  Chief Complaint:   Failed right TKA, S/P resection of TKA due to infection  HPI: Brittany Russo, 74 y.o. female, has a history of pain and functional disability in the right knee due to failed right TKA and subsequent infection and has failed non-surgical conservative treatments   Onset of symptoms was gradual, starting with a fall and periprosthetic distal femur fracture. The patient noted prior procedures on the knee to include  arthroplasty and subsequent resection on the right knee(s).  Patient currently rates pain in the right knee(s) at 8 out of 10 with and more concerns as she is unable to really use the leg.  Risks, benefits and expectations were discussed with the patient.  Risks including but not limited to the risk of anesthesia, blood clots, nerve damage, blood vessel damage, failure of the wound to heal,  The potential for subsequent surgeries,  infection and up to and including death.  Patient understand the risks, benefits and expectations and wishes to proceed with surgery.  PCP: Rosalee Kaufman, PA-C  D/C Plans:       CIR  Post-op Meds:       No Rx given   Tranexamic Acid:      To be given - IV   Decadron:      Is to be given  FYI:     Xarelto  Norco  No Celebrex - kidney disease  DME:   Pt already has equipment  PT:   Rehab  Pharmacy: [facility]   Patient Active Problem List   Diagnosis Date Noted  . Hyperglycemia   . Failed total right knee replacement ([place])   . Hepatic encephalopathy ([place]) 12/28/2018  . UTI (urinary tract infection) 12/28/2018  . Bradycardia 12/28/2018  . Hyperkalemia 12/28/2018  . Hematuria 11/23/2018  . Overweight (BMI 25.0-29.9) 11/23/2018  . Mechanical problem with extremity 11/22/2018  . Cirrhosis of liver with ascites ([place]) 11/09/2018  . Nausea without vomiting 10/26/2018  . Ascites   . Rectal bleeding   . AKI (acute  kidney injury) ([place]) 10/11/2018  . Lower GI bleed 10/10/2018  . Anasarca 10/10/2018  . Hypoalbuminemia 10/10/2018  . Depression 09/28/2018  . Acute hepatic encephalopathy 09/28/2018  . Cirrhosis ([place]) 09/28/2018  . Periprosthetic fracture around internal prosthetic right knee joint 09/24/2018  . S/P right TKA 09/22/2018  . Status post total knee replacement, right 09/22/2018  . DM2 (diabetes mellitus, type 2) ([place]) 07/11/2015  . Hypertension 07/11/2015  . Hyperlipidemia 07/11/2015   Past Medical History:  Diagnosis Date  . Acute and subacute hepatic failure without coma   . Allergy   . Anasarca   . Anxiety   . Arthritis   . Ascites 09/2018   no SBP on 850 cc tap 10/13/18  . Asthma    allery induced  . Cirrhosis of liver ([place]) ~ 2004   from NAFLD.  Dx ~ 2004.  Dr Dorcas Mcmurray at [facility]/    . Depression   . Diabetes mellitus without complication ([place])    type 2  . Eczema of both hands   . Endometrial cancer ([place]) 1996, 2003   hysterectomy 1996.  recurrence 2003, treated with radiation.    . Generalized edema   . GI hemorrhage   . 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 in [place]  . Hyperlipidemia   .  Hypertension   . Muscle weakness   . Seizures ([place])    last seizure June 07, 2014   . Thrombocytopenia ([place])   . Thyroid nodule     Past Surgical History:  Procedure Laterality Date  . CATARACT EXTRACTION W/PHACO Left 06/04/2017   Procedure: CATARACT EXTRACTION PHACO AND INTRAOCULAR LENS PLACEMENT (IOC);  Surgeon: Baruch Goldmann, MD;  [facility];  Service: Ophthalmology;  Laterality: Left;  CDE: 3.90  . CATARACT EXTRACTION W/PHACO Right 07/15/2017   Procedure: CATARACT EXTRACTION PHACO AND INTRAOCULAR LENS PLACEMENT (IOC);  Surgeon: Baruch Goldmann, MD;  Location: [facility];  Service: Ophthalmology;  Laterality: Right;  CDE: 7.60  . COLONOSCOPY  06/2007   in [place]. for hematochzia.  radiation proctitis  .  DILATION AND CURETTAGE OF UTERUS    . ESOPHAGOGASTRODUODENOSCOPY  2016  . ESOPHAGOGASTRODUODENOSCOPY (EGD) WITH PROPOFOL N/A 12/30/2018   Procedure: EGD;  Surgeon: Clarene Essex, MD;  [facility];  Service: Endoscopy;  Laterality: N/A;  . EXCISIONAL TOTAL KNEE ARTHROPLASTY Right 01/03/2019   Procedure: EXCISIONAL TOTAL KNEE ARTHROPLASTY;  Surgeon: Paralee Cancel, MD;  [facility];  Service: Orthopedics;  Laterality: Right;  . FLEXIBLE SIGMOIDOSCOPY N/A 12/30/2018   Procedure: FLEXIBLE SIGMOIDOSCOPY;  Surgeon: Clarene Essex, MD;  [facility];  Service: Endoscopy;  Laterality: N/A;  . IR PARACENTESIS  10/13/2018  . TOTAL ABDOMINAL HYSTERECTOMY  1996  . TOTAL KNEE ARTHROPLASTY Right 09/22/2018   Procedure: TOTAL KNEE ARTHROPLASTY;  Surgeon: Paralee Cancel, MD;  [facility];  Service: Orthopedics;  Laterality: Right;  70 mins  . TOTAL KNEE REVISION Right 09/27/2018   Procedure: TOTAL KNEE REVISION;  Surgeon: Paralee Cancel, MD;  [facility];  Service: Orthopedics;  Laterality: Right;  . TOTAL KNEE REVISION Right 11/22/2018   Procedure: repair right knee extensor mechanism;  Surgeon: Paralee Cancel, MD;  [facility];  Service: Orthopedics;  Laterality: Right;  90 mins    No current facility-administered medications for this encounter.   Current Outpatient Medications  Medication Sig Dispense Refill Last Dose  . albuterol (VENTOLIN HFA) 108 (90 Base) MCG/ACT inhaler Inhale 1-2 puffs into the lungs every 6 (six) hours as needed for wheezing or shortness of breath.     . ALPRAZolam (XANAX) 0.25 MG tablet Take 1 tablet (0.25 mg total) by mouth 2 (two) times daily. 10 tablet 0   . atenolol (TENORMIN) 50 MG tablet Take 50 mg by mouth daily.     [name] atorvastatin (LIPITOR) 80 MG tablet Take 80 mg by mouth daily at 8 pm.     . buPROPion (WELLBUTRIN XL) 150 MG 24 hr tablet Take 150 mg by mouth daily.     [name] escitalopram (LEXAPRO) 20 MG tablet Take 20 mg by mouth daily.     .  fluticasone (FLONASE) 50 MCG/ACT nasal spray Place 1 spray into both nostrils daily.      [name] gabapentin (NEURONTIN) 300 MG capsule Take 300 mg by mouth daily as needed (pain).      [name] glipiZIDE (GLUCOTROL) 5 MG tablet Take 5 mg by mouth 2 (two) times daily.     [name] glucagon (GLUCAGON EMERGENCY) 1 MG injection Inject 1 mg into the muscle once as needed (blood sugar less than 60).     . Insulin Glargine (BASAGLAR KWIKPEN) 100 UNIT/ML Inject 20-30 Units into the skin daily.     [name] lactulose (CHRONULAC) 10 GM/15ML solution Take 30 mLs (20 g total) by mouth 2 (two) times daily. (Patient taking differently: Take  10 g by mouth 2 (two) times a week. )     . loratadine (CLARITIN) 10 MG tablet Take 10 mg by mouth daily.     . Multiple Vitamin (MULTIVITAMIN WITH MINERALS) TABS tablet Take 1 tablet by mouth daily.     . rifaximin (XIFAXAN) 550 MG TABS tablet Take 550 mg by mouth 2 (two) times daily.     . sitaGLIPtin-metformin (JANUMET) 50-500 MG per tablet Take 1 tablet by mouth 2 (two) times daily with a meal.     . traZODone (DESYREL) 50 MG tablet Take 50 mg by mouth at bedtime as needed for sleep.      . Amino Acids-Protein Hydrolys (FEEDING SUPPLEMENT, PRO-STAT SUGAR FREE 64,) LIQD Take 30 mLs by mouth 2 (two) times daily. (Patient not taking: Reported on 06/14/2019)   Not Taking at Unknown time  . docusate sodium (COLACE) 100 MG capsule Take 1 capsule (100 mg total) by mouth 2 (two) times daily. (Patient not taking: Reported on 06/14/2019) 28 capsule 0 Not Taking at Unknown time  . ferrous sulfate 325 (65 FE) MG tablet Take 1 tablet (325 mg total) by mouth 2 (two) times daily with a meal. (Patient not taking: Reported on 06/14/2019)  3 Not Taking at Unknown time  . HYDROcodone-acetaminophen (NORCO) 5-325 MG tablet Take 1-2 tablets by mouth every 6 (six) hours as needed for moderate pain or severe pain. (Patient not taking: Reported on 06/14/2019) 40 tablet 0 Not Taking at Unknown time  . insulin aspart (NOVOLOG) 100  UNIT/ML injection Inject 4 Units into the skin 3 (three) times daily with meals. (Patient not taking: Reported on 06/14/2019)   Not Taking at Unknown time  . insulin glargine (LANTUS) 100 UNIT/ML injection Inject 0.2 mLs (20 Units total) into the skin at bedtime. (Patient not taking: Reported on 06/14/2019)   Not Taking at Unknown time  . methocarbamol (ROBAXIN) 500 MG tablet Take 1 tablet (500 mg total) by mouth every 6 (six) hours as needed for muscle spasms. (Patient not taking: Reported on 06/14/2019) 40 tablet 0 Not Taking at Unknown time  . ondansetron (ZOFRAN) 4 MG tablet Take 1 tablet (4 mg total) by mouth every 6 (six) hours as needed for nausea. (Patient not taking: Reported on 06/14/2019) 20 tablet 0 Not Taking at Unknown time   Allergies  Allergen Reactions  . Asa [Aspirin] Other (See Comments)    Pt not allergic but cannot take due to bleeding   . Other Other (See Comments)    Any jewelry metal-hives and itching     Social History   Tobacco Use  . Smoking status: Never Smoker  . Smokeless tobacco: Never Used  Substance Use Topics  . Alcohol use: No    Family History  Problem Relation Age of Onset  . Heart disease Mother   . Diabetes Mother   . Heart disease Father   . Cancer Father   . Cancer Maternal Grandmother   . Heart disease Maternal Grandfather      Review of Systems  Constitutional: Negative.   HENT: Negative.   Eyes: Negative.   Respiratory: Negative.   Cardiovascular: Negative.   Gastrointestinal: Negative.   Genitourinary: Negative.   Musculoskeletal: Positive for joint pain.  Skin: Negative.   Neurological: Negative.   Endo/Heme/Allergies: Positive for environmental allergies.  Psychiatric/Behavioral: Positive for depression. The patient is nervous/anxious.       Objective:  Physical Exam  Constitutional: She is oriented to person, place, and time. She appears well-developed.  HENT:  Head: Normocephalic.  Eyes: Pupils are equal, round, and  reactive to light.  Neck: No JVD present. No tracheal deviation present. No thyromegaly present.  Cardiovascular: Normal rate, regular rhythm and intact distal pulses.  Respiratory: Effort normal and breath sounds normal. No respiratory distress. She has no wheezes.  GI: Soft. There is no abdominal tenderness. There is no guarding.  Musculoskeletal:     Cervical back: Neck supple.     Right knee: Swelling, deformity and bony tenderness present. No lacerations (healed previous incision). Decreased range of motion. Tenderness present. Abnormal alignment.  Lymphadenopathy:    She has no cervical adenopathy.  Neurological: She is alert and oriented to person, place, and time. A sensory deficit (left drop foot) is present.  Skin: Skin is warm and dry.  Psychiatric: She has a normal mood and affect.      Labs:  Estimated body mass index is 28.12 kg/m as calculated from the following:   Height as of 06/20/19: 5\' 5"  (1.651 m).   Weight as of 06/20/19: 76.7 kg.   Imaging Review Plain radiographs demonstrate resection of her right TKA and girdlestone procedure.  The bone quality appears to be poor for age and reported activity level.      Assessment/Plan:  Failed right TKA, S/P resection of TKA due to infection   The patient history, physical examination, clinical judgment of the provider and imaging studies are consistent with failed right TKA, S/P resection of TKA due to infectionof the right knee and above the knee amputation is deemed medically necessary to avoid issues with a flail right lower leg. The treatment options including medical management, injection therapy arthroscopy and revision arthroplasty were discussed at length. The risks and benefits of the amputation  were presented and reviewed. The patient acknowledged the explanation, agreed to proceed with the plan and consent was signed. Patient is being admitted for inpatient treatment for surgery, pain control, PT, OT,  prophylactic antibiotics, VTE prophylaxis, progressive ambulation and ADL's and discharge planning. The patient is planning to be discharged to inpatient rehab    [place]. Tiffanye Hartmann   PA-C  06/28/2019, 9:45 PM

## 2019-06-29 ENCOUNTER — Inpatient Hospital Stay (HOSPITAL_COMMUNITY): Payer: Medicare Other | Admitting: Certified Registered Nurse Anesthetist

## 2019-06-29 ENCOUNTER — Inpatient Hospital Stay (HOSPITAL_COMMUNITY)
Admission: RE | Admit: 2019-06-29 | Discharge: 2019-07-04 | DRG: 476 | Disposition: A | Discharge status: Rehabilitation Facility | Payer: Medicare Other | Attending: Orthopedic Surgery | Admitting: Orthopedic Surgery

## 2019-06-29 ENCOUNTER — Encounter (HOSPITAL_COMMUNITY): Payer: Self-pay | Admitting: Orthopedic Surgery

## 2019-06-29 ENCOUNTER — Other Ambulatory Visit: Payer: Self-pay

## 2019-06-29 ENCOUNTER — Inpatient Hospital Stay (HOSPITAL_COMMUNITY): Payer: Medicare Other | Admitting: Physician Assistant

## 2019-06-29 ENCOUNTER — Encounter (HOSPITAL_COMMUNITY)
Admission: RE | Disposition: A | Discharge status: Rehabilitation Facility | Payer: Self-pay | Source: Home / Self Care | Attending: Orthopedic Surgery

## 2019-06-29 DIAGNOSIS — R188 Other ascites: Secondary | ICD-10-CM | POA: Diagnosis present

## 2019-06-29 DIAGNOSIS — F419 Anxiety disorder, unspecified: Secondary | ICD-10-CM | POA: Diagnosis present

## 2019-06-29 DIAGNOSIS — D689 Coagulation defect, unspecified: Secondary | ICD-10-CM | POA: Diagnosis present

## 2019-06-29 DIAGNOSIS — K7210 Chronic hepatic failure without coma: Secondary | ICD-10-CM | POA: Diagnosis present

## 2019-06-29 DIAGNOSIS — D62 Acute posthemorrhagic anemia: Secondary | ICD-10-CM | POA: Diagnosis not present

## 2019-06-29 DIAGNOSIS — Z8249 Family history of ischemic heart disease and other diseases of the circulatory system: Secondary | ICD-10-CM

## 2019-06-29 DIAGNOSIS — M9711XD Periprosthetic fracture around internal prosthetic right knee joint, subsequent encounter: Secondary | ICD-10-CM | POA: Diagnosis not present

## 2019-06-29 DIAGNOSIS — Z886 Allergy status to analgesic agent status: Secondary | ICD-10-CM | POA: Diagnosis not present

## 2019-06-29 DIAGNOSIS — Z7901 Long term (current) use of anticoagulants: Secondary | ICD-10-CM | POA: Diagnosis not present

## 2019-06-29 DIAGNOSIS — T84092A Other mechanical complication of internal right knee prosthesis, initial encounter: Principal | ICD-10-CM | POA: Diagnosis present

## 2019-06-29 DIAGNOSIS — T8489XD Other specified complication of internal orthopedic prosthetic devices, implants and grafts, subsequent encounter: Secondary | ICD-10-CM | POA: Diagnosis not present

## 2019-06-29 DIAGNOSIS — Y792 Prosthetic and other implants, materials and accessory orthopedic devices associated with adverse incidents: Secondary | ICD-10-CM | POA: Diagnosis present

## 2019-06-29 DIAGNOSIS — E119 Type 2 diabetes mellitus without complications: Secondary | ICD-10-CM | POA: Diagnosis present

## 2019-06-29 DIAGNOSIS — S78111A Complete traumatic amputation at level between right hip and knee, initial encounter: Secondary | ICD-10-CM

## 2019-06-29 DIAGNOSIS — E7220 Disorder of urea cycle metabolism, unspecified: Secondary | ICD-10-CM | POA: Diagnosis present

## 2019-06-29 DIAGNOSIS — Z8542 Personal history of malignant neoplasm of other parts of uterus: Secondary | ICD-10-CM

## 2019-06-29 DIAGNOSIS — G546 Phantom limb syndrome with pain: Secondary | ICD-10-CM | POA: Diagnosis not present

## 2019-06-29 DIAGNOSIS — M1712 Unilateral primary osteoarthritis, left knee: Secondary | ICD-10-CM | POA: Diagnosis not present

## 2019-06-29 DIAGNOSIS — Z89611 Acquired absence of right leg above knee: Secondary | ICD-10-CM | POA: Diagnosis not present

## 2019-06-29 DIAGNOSIS — I10 Essential (primary) hypertension: Secondary | ICD-10-CM | POA: Diagnosis present

## 2019-06-29 DIAGNOSIS — T8453XA Infection and inflammatory reaction due to internal right knee prosthesis, initial encounter: Secondary | ICD-10-CM | POA: Diagnosis not present

## 2019-06-29 DIAGNOSIS — Z794 Long term (current) use of insulin: Secondary | ICD-10-CM

## 2019-06-29 DIAGNOSIS — E1165 Type 2 diabetes mellitus with hyperglycemia: Secondary | ICD-10-CM | POA: Diagnosis not present

## 2019-06-29 DIAGNOSIS — Z833 Family history of diabetes mellitus: Secondary | ICD-10-CM | POA: Diagnosis not present

## 2019-06-29 DIAGNOSIS — R310 Gross hematuria: Secondary | ICD-10-CM | POA: Diagnosis not present

## 2019-06-29 DIAGNOSIS — E785 Hyperlipidemia, unspecified: Secondary | ICD-10-CM | POA: Diagnosis present

## 2019-06-29 DIAGNOSIS — D696 Thrombocytopenia, unspecified: Secondary | ICD-10-CM | POA: Diagnosis not present

## 2019-06-29 DIAGNOSIS — Z96651 Presence of right artificial knee joint: Secondary | ICD-10-CM | POA: Diagnosis not present

## 2019-06-29 DIAGNOSIS — Z87440 Personal history of urinary (tract) infections: Secondary | ICD-10-CM | POA: Diagnosis not present

## 2019-06-29 DIAGNOSIS — D509 Iron deficiency anemia, unspecified: Secondary | ICD-10-CM | POA: Diagnosis present

## 2019-06-29 DIAGNOSIS — K7460 Unspecified cirrhosis of liver: Secondary | ICD-10-CM | POA: Diagnosis not present

## 2019-06-29 DIAGNOSIS — R339 Retention of urine, unspecified: Secondary | ICD-10-CM | POA: Diagnosis not present

## 2019-06-29 DIAGNOSIS — F411 Generalized anxiety disorder: Secondary | ICD-10-CM | POA: Diagnosis not present

## 2019-06-29 DIAGNOSIS — R5383 Other fatigue: Secondary | ICD-10-CM | POA: Diagnosis not present

## 2019-06-29 DIAGNOSIS — R319 Hematuria, unspecified: Secondary | ICD-10-CM | POA: Diagnosis not present

## 2019-06-29 DIAGNOSIS — R791 Abnormal coagulation profile: Secondary | ICD-10-CM | POA: Diagnosis not present

## 2019-06-29 DIAGNOSIS — N3040 Irradiation cystitis without hematuria: Secondary | ICD-10-CM | POA: Diagnosis not present

## 2019-06-29 DIAGNOSIS — F329 Major depressive disorder, single episode, unspecified: Secondary | ICD-10-CM | POA: Diagnosis present

## 2019-06-29 DIAGNOSIS — R0989 Other specified symptoms and signs involving the circulatory and respiratory systems: Secondary | ICD-10-CM | POA: Diagnosis not present

## 2019-06-29 DIAGNOSIS — N390 Urinary tract infection, site not specified: Secondary | ICD-10-CM | POA: Diagnosis not present

## 2019-06-29 DIAGNOSIS — S78119A Complete traumatic amputation at level between unspecified hip and knee, initial encounter: Secondary | ICD-10-CM | POA: Diagnosis not present

## 2019-06-29 DIAGNOSIS — E871 Hypo-osmolality and hyponatremia: Secondary | ICD-10-CM | POA: Diagnosis not present

## 2019-06-29 DIAGNOSIS — N3041 Irradiation cystitis with hematuria: Secondary | ICD-10-CM | POA: Diagnosis not present

## 2019-06-29 DIAGNOSIS — Z79899 Other long term (current) drug therapy: Secondary | ICD-10-CM | POA: Diagnosis not present

## 2019-06-29 DIAGNOSIS — J918 Pleural effusion in other conditions classified elsewhere: Secondary | ICD-10-CM | POA: Diagnosis not present

## 2019-06-29 DIAGNOSIS — R7309 Other abnormal glucose: Secondary | ICD-10-CM | POA: Diagnosis not present

## 2019-06-29 DIAGNOSIS — Y842 Radiological procedure and radiotherapy as the cause of abnormal reaction of the patient, or of later complication, without mention of misadventure at the time of the procedure: Secondary | ICD-10-CM | POA: Diagnosis present

## 2019-06-29 DIAGNOSIS — F320 Major depressive disorder, single episode, mild: Secondary | ICD-10-CM | POA: Diagnosis not present

## 2019-06-29 DIAGNOSIS — D649 Anemia, unspecified: Secondary | ICD-10-CM | POA: Diagnosis not present

## 2019-06-29 DIAGNOSIS — I5033 Acute on chronic diastolic (congestive) heart failure: Secondary | ICD-10-CM | POA: Diagnosis not present

## 2019-06-29 DIAGNOSIS — G8918 Other acute postprocedural pain: Secondary | ICD-10-CM | POA: Diagnosis not present

## 2019-06-29 DIAGNOSIS — J90 Pleural effusion, not elsewhere classified: Secondary | ICD-10-CM | POA: Diagnosis not present

## 2019-06-29 DIAGNOSIS — Z4781 Encounter for orthopedic aftercare following surgical amputation: Secondary | ICD-10-CM | POA: Diagnosis not present

## 2019-06-29 DIAGNOSIS — J45909 Unspecified asthma, uncomplicated: Secondary | ICD-10-CM | POA: Diagnosis present

## 2019-06-29 HISTORY — PX: STERIOD INJECTION: SHX5046

## 2019-06-29 HISTORY — PX: AMPUTATION: SHX166

## 2019-06-29 LAB — GLUCOSE, CAPILLARY
Glucose-Capillary: 114 mg/dL — ABNORMAL HIGH (ref 70–99)
Glucose-Capillary: 120 mg/dL — ABNORMAL HIGH (ref 70–99)
Glucose-Capillary: 315 mg/dL — ABNORMAL HIGH (ref 70–99)
Glucose-Capillary: 304 mg/dL — ABNORMAL HIGH (ref 70–99)

## 2019-06-29 LAB — TYPE AND SCREEN
ABO/RH(D): A POS
Antibody Screen: NEGATIVE

## 2019-06-29 SURGERY — AMPUTATION, ABOVE KNEE
Anesthesia: General | Site: Knee | Laterality: Right

## 2019-06-29 MED ORDER — SITAGLIPTIN PHOS-METFORMIN HCL 50-500 MG PO TABS: 1.0000 | ORAL_TABLET | Freq: Two times a day (BID) | ORAL | Status: DC

## 2019-06-29 MED ORDER — LACTATED RINGERS IV SOLN: INTRAVENOUS | Status: DC

## 2019-06-29 MED ORDER — INSULIN GLARGINE 100 UNIT/ML ~~LOC~~ SOLN
20.0000 [IU] | Freq: Every day | SUBCUTANEOUS | Status: DC
  Administered 2019-06-30 – 2019-07-04 (×5): 20 [IU] via SUBCUTANEOUS
  Filled 2019-06-29 (×5): qty 0.2

## 2019-06-29 MED ORDER — FENTANYL CITRATE (PF) 100 MCG/2ML IJ SOLN: 25.0000 ug | INTRAMUSCULAR | Status: DC | PRN

## 2019-06-29 MED ORDER — GABAPENTIN 300 MG PO CAPS
300.0000 mg | ORAL_CAPSULE | Freq: Every day | ORAL | Status: DC | PRN
  Administered 2019-06-30: 300 mg via ORAL
  Filled 2019-06-29: qty 1

## 2019-06-29 MED ORDER — INSULIN ASPART 100 UNIT/ML ~~LOC~~ SOLN
0.0000 [IU] | Freq: Three times a day (TID) | SUBCUTANEOUS | Status: DC
  Administered 2019-06-29: 11 [IU] via SUBCUTANEOUS
  Administered 2019-06-30: 3 [IU] via SUBCUTANEOUS
  Administered 2019-06-30: 5 [IU] via SUBCUTANEOUS
  Administered 2019-06-30 – 2019-07-03 (×2): 3 [IU] via SUBCUTANEOUS
  Administered 2019-07-03: 2 [IU] via SUBCUTANEOUS

## 2019-06-29 MED ORDER — ONDANSETRON HCL 4 MG/2ML IJ SOLN: 4.0000 mg | Freq: Four times a day (QID) | INTRAMUSCULAR | Status: DC | PRN

## 2019-06-29 MED ORDER — HYDROMORPHONE HCL 1 MG/ML IJ SOLN: 0.5000 mg | INTRAMUSCULAR | Status: DC | PRN

## 2019-06-29 MED ORDER — LIDOCAINE 2% (20 MG/ML) 5 ML SYRINGE
INTRAMUSCULAR | Status: DC | PRN
  Administered 2019-06-29: 60 mg via INTRAVENOUS

## 2019-06-29 MED ORDER — ESCITALOPRAM OXALATE 20 MG PO TABS
20.0000 mg | ORAL_TABLET | Freq: Every day | ORAL | Status: DC
  Administered 2019-06-30 – 2019-07-04 (×5): 20 mg via ORAL
  Filled 2019-06-29 (×5): qty 1

## 2019-06-29 MED ORDER — ALBUTEROL SULFATE (2.5 MG/3ML) 0.083% IN NEBU: 3.0000 mL | INHALATION_SOLUTION | Freq: Four times a day (QID) | RESPIRATORY_TRACT | Status: DC | PRN

## 2019-06-29 MED ORDER — KETAMINE HCL 10 MG/ML IJ SOLN
INTRAMUSCULAR | Status: AC
  Filled 2019-06-29: qty 1

## 2019-06-29 MED ORDER — METHOCARBAMOL 500 MG PO TABS
500.0000 mg | ORAL_TABLET | Freq: Four times a day (QID) | ORAL | Status: DC | PRN
  Administered 2019-06-29 – 2019-07-02 (×5): 500 mg via ORAL
  Filled 2019-06-29 (×5): qty 1

## 2019-06-29 MED ORDER — SUGAMMADEX SODIUM 500 MG/5ML IV SOLN
INTRAVENOUS | Status: DC | PRN
  Administered 2019-06-29: 284.8 mg via INTRAVENOUS

## 2019-06-29 MED ORDER — 0.9 % SODIUM CHLORIDE (POUR BTL) OPTIME
TOPICAL | Status: DC | PRN
  Administered 2019-06-29: 1000 mL

## 2019-06-29 MED ORDER — ATENOLOL 50 MG PO TABS
50.0000 mg | ORAL_TABLET | Freq: Every day | ORAL | Status: DC
  Administered 2019-06-30 – 2019-07-04 (×5): 50 mg via ORAL
  Filled 2019-06-29 (×5): qty 1

## 2019-06-29 MED ORDER — MIDAZOLAM HCL 2 MG/2ML IJ SOLN
INTRAMUSCULAR | Status: AC
  Filled 2019-06-29: qty 2

## 2019-06-29 MED ORDER — BISACODYL 10 MG RE SUPP: 10.0000 mg | Freq: Every day | RECTAL | Status: DC | PRN

## 2019-06-29 MED ORDER — PROPOFOL 10 MG/ML IV BOLUS
INTRAVENOUS | Status: DC | PRN
  Administered 2019-06-29: 160 mg via INTRAVENOUS
  Administered 2019-06-29: 30 mg via INTRAVENOUS

## 2019-06-29 MED ORDER — ATORVASTATIN CALCIUM 40 MG PO TABS
80.0000 mg | ORAL_TABLET | Freq: Every day | ORAL | Status: DC
  Administered 2019-06-29 – 2019-07-03 (×5): 80 mg via ORAL
  Filled 2019-06-29 (×5): qty 2

## 2019-06-29 MED ORDER — CELECOXIB 200 MG PO CAPS
200.0000 mg | ORAL_CAPSULE | Freq: Once | ORAL | Status: AC
  Administered 2019-06-29: 200 mg via ORAL
  Filled 2019-06-29: qty 1

## 2019-06-29 MED ORDER — ROCURONIUM BROMIDE 50 MG/5ML IV SOSY
PREFILLED_SYRINGE | INTRAVENOUS | Status: DC | PRN
  Administered 2019-06-29: 50 mg via INTRAVENOUS

## 2019-06-29 MED ORDER — TRANEXAMIC ACID-NACL 1000-0.7 MG/100ML-% IV SOLN
1000.0000 mg | INTRAVENOUS | Status: AC
  Administered 2019-06-29: 1000 mg via INTRAVENOUS
  Filled 2019-06-29: qty 100

## 2019-06-29 MED ORDER — FLUTICASONE PROPIONATE 50 MCG/ACT NA SUSP
1.0000 | Freq: Every day | NASAL | Status: DC
  Administered 2019-06-30 – 2019-07-04 (×5): 1 via NASAL
  Filled 2019-06-29: qty 16

## 2019-06-29 MED ORDER — BUPIVACAINE HCL (PF) 0.25 % IJ SOLN
INTRAMUSCULAR | Status: AC
  Filled 2019-06-29: qty 30

## 2019-06-29 MED ORDER — PROMETHAZINE HCL 25 MG/ML IJ SOLN: 6.2500 mg | INTRAMUSCULAR | Status: DC | PRN

## 2019-06-29 MED ORDER — RIVAROXABAN 10 MG PO TABS
10.0000 mg | ORAL_TABLET | ORAL | Status: DC
  Administered 2019-06-30 – 2019-07-04 (×5): 10 mg via ORAL
  Filled 2019-06-29 (×4): qty 1

## 2019-06-29 MED ORDER — HYDROCODONE-ACETAMINOPHEN 7.5-325 MG PO TABS: 1.0000 | ORAL_TABLET | ORAL | Status: DC | PRN

## 2019-06-29 MED ORDER — CEFAZOLIN SODIUM-DEXTROSE 2-4 GM/100ML-% IV SOLN
2.0000 g | Freq: Four times a day (QID) | INTRAVENOUS | Status: AC
  Administered 2019-06-29 (×2): 2 g via INTRAVENOUS
  Filled 2019-06-29 (×2): qty 100

## 2019-06-29 MED ORDER — PHENYLEPHRINE 40 MCG/ML (10ML) SYRINGE FOR IV PUSH (FOR BLOOD PRESSURE SUPPORT)
PREFILLED_SYRINGE | INTRAVENOUS | Status: AC
  Filled 2019-06-29: qty 10

## 2019-06-29 MED ORDER — FENTANYL CITRATE (PF) 100 MCG/2ML IJ SOLN
INTRAMUSCULAR | Status: AC
  Administered 2019-06-29: 50 ug via INTRAVENOUS
  Filled 2019-06-29: qty 2

## 2019-06-29 MED ORDER — ONDANSETRON HCL 4 MG/2ML IJ SOLN
INTRAMUSCULAR | Status: AC
  Filled 2019-06-29: qty 2

## 2019-06-29 MED ORDER — PHENOL 1.4 % MT LIQD: 1.0000 | OROMUCOSAL | Status: DC | PRN

## 2019-06-29 MED ORDER — CEFAZOLIN SODIUM-DEXTROSE 2-4 GM/100ML-% IV SOLN
2.0000 g | INTRAVENOUS | Status: AC
  Administered 2019-06-29: 2 g via INTRAVENOUS
  Filled 2019-06-29: qty 100

## 2019-06-29 MED ORDER — DIPHENHYDRAMINE HCL 12.5 MG/5ML PO ELIX: 12.5000 mg | ORAL_SOLUTION | ORAL | Status: DC | PRN

## 2019-06-29 MED ORDER — TRANEXAMIC ACID-NACL 1000-0.7 MG/100ML-% IV SOLN
1000.0000 mg | Freq: Once | INTRAVENOUS | Status: AC
  Administered 2019-06-29: 1000 mg via INTRAVENOUS
  Filled 2019-06-29: qty 100

## 2019-06-29 MED ORDER — HYDROCODONE-ACETAMINOPHEN 5-325 MG PO TABS
1.0000 | ORAL_TABLET | ORAL | Status: DC | PRN
  Administered 2019-06-30 – 2019-07-02 (×2): 1 via ORAL
  Filled 2019-06-29: qty 2
  Filled 2019-06-29 (×2): qty 1

## 2019-06-29 MED ORDER — DEXAMETHASONE SODIUM PHOSPHATE 10 MG/ML IJ SOLN
10.0000 mg | Freq: Once | INTRAMUSCULAR | Status: AC
  Administered 2019-06-29: 10 mg via INTRAVENOUS

## 2019-06-29 MED ORDER — ROCURONIUM BROMIDE 10 MG/ML (PF) SYRINGE
PREFILLED_SYRINGE | INTRAVENOUS | Status: AC
  Filled 2019-06-29: qty 10

## 2019-06-29 MED ORDER — ALUM & MAG HYDROXIDE-SIMETH 200-200-20 MG/5ML PO SUSP: 15.0000 mL | ORAL | Status: DC | PRN

## 2019-06-29 MED ORDER — METHYLPREDNISOLONE ACETATE 80 MG/ML IJ SUSP: INTRAMUSCULAR | Status: DC | PRN

## 2019-06-29 MED ORDER — ONDANSETRON HCL 4 MG PO TABS: 4.0000 mg | ORAL_TABLET | Freq: Four times a day (QID) | ORAL | Status: DC | PRN

## 2019-06-29 MED ORDER — POLYETHYLENE GLYCOL 3350 17 G PO PACK
17.0000 g | PACK | Freq: Two times a day (BID) | ORAL | Status: DC
  Administered 2019-06-29 – 2019-07-01 (×4): 17 g via ORAL
  Filled 2019-06-29 (×6): qty 1

## 2019-06-29 MED ORDER — FENTANYL CITRATE (PF) 100 MCG/2ML IJ SOLN
INTRAMUSCULAR | Status: AC
  Filled 2019-06-29: qty 2

## 2019-06-29 MED ORDER — EPHEDRINE SULFATE 50 MG/ML IJ SOLN
INTRAMUSCULAR | Status: DC | PRN
  Administered 2019-06-29: 5 mg via INTRAVENOUS
  Administered 2019-06-29: 15 mg via INTRAVENOUS
  Administered 2019-06-29: 10 mg via INTRAVENOUS

## 2019-06-29 MED ORDER — METOCLOPRAMIDE HCL 5 MG PO TABS: 5.0000 mg | ORAL_TABLET | Freq: Three times a day (TID) | ORAL | Status: DC | PRN

## 2019-06-29 MED ORDER — ALPRAZOLAM 0.25 MG PO TABS
0.2500 mg | ORAL_TABLET | Freq: Two times a day (BID) | ORAL | Status: DC
  Administered 2019-06-29 – 2019-07-04 (×10): 0.25 mg via ORAL
  Filled 2019-06-29 (×10): qty 1

## 2019-06-29 MED ORDER — KETAMINE HCL 10 MG/ML IJ SOLN
INTRAMUSCULAR | Status: DC | PRN
  Administered 2019-06-29: 30 mg via INTRAVENOUS

## 2019-06-29 MED ORDER — METHYLPREDNISOLONE ACETATE 40 MG/ML IJ SUSP
INTRAMUSCULAR | Status: AC
  Filled 2019-06-29: qty 2

## 2019-06-29 MED ORDER — DOCUSATE SODIUM 100 MG PO CAPS
100.0000 mg | ORAL_CAPSULE | Freq: Two times a day (BID) | ORAL | Status: DC
  Administered 2019-06-29 – 2019-07-01 (×4): 100 mg via ORAL
  Filled 2019-06-29 (×9): qty 1

## 2019-06-29 MED ORDER — BUPROPION HCL ER (XL) 150 MG PO TB24
150.0000 mg | ORAL_TABLET | Freq: Every day | ORAL | Status: DC
  Administered 2019-06-30 – 2019-07-04 (×5): 150 mg via ORAL
  Filled 2019-06-29 (×5): qty 1

## 2019-06-29 MED ORDER — SUGAMMADEX SODIUM 500 MG/5ML IV SOLN
INTRAVENOUS | Status: AC
  Filled 2019-06-29: qty 5

## 2019-06-29 MED ORDER — EPHEDRINE 5 MG/ML INJ
INTRAVENOUS | Status: AC
  Filled 2019-06-29: qty 10

## 2019-06-29 MED ORDER — RIFAXIMIN 550 MG PO TABS
550.0000 mg | ORAL_TABLET | Freq: Two times a day (BID) | ORAL | Status: DC
  Administered 2019-06-29 – 2019-07-04 (×10): 550 mg via ORAL
  Filled 2019-06-29 (×11): qty 1

## 2019-06-29 MED ORDER — GLIPIZIDE 5 MG PO TABS
5.0000 mg | ORAL_TABLET | Freq: Two times a day (BID) | ORAL | Status: DC
  Administered 2019-06-29 – 2019-07-01 (×4): 5 mg via ORAL
  Filled 2019-06-29 (×6): qty 1

## 2019-06-29 MED ORDER — FERROUS SULFATE 325 (65 FE) MG PO TABS
325.0000 mg | ORAL_TABLET | Freq: Two times a day (BID) | ORAL | Status: DC
  Administered 2019-06-29 – 2019-07-04 (×10): 325 mg via ORAL
  Filled 2019-06-29 (×10): qty 1

## 2019-06-29 MED ORDER — SODIUM CHLORIDE 0.9 % IV SOLN: INTRAVENOUS | Status: DC

## 2019-06-29 MED ORDER — LINAGLIPTIN 5 MG PO TABS
5.0000 mg | ORAL_TABLET | Freq: Every day | ORAL | Status: DC
  Administered 2019-06-30 – 2019-07-04 (×5): 5 mg via ORAL
  Filled 2019-06-29 (×5): qty 1

## 2019-06-29 MED ORDER — LIDOCAINE 2% (20 MG/ML) 5 ML SYRINGE
INTRAMUSCULAR | Status: AC
  Filled 2019-06-29: qty 5

## 2019-06-29 MED ORDER — METHOCARBAMOL 500 MG IVPB - SIMPLE MED
500.0000 mg | Freq: Four times a day (QID) | INTRAVENOUS | Status: DC | PRN
  Filled 2019-06-29: qty 50

## 2019-06-29 MED ORDER — PHENYLEPHRINE 40 MCG/ML (10ML) SYRINGE FOR IV PUSH (FOR BLOOD PRESSURE SUPPORT)
PREFILLED_SYRINGE | INTRAVENOUS | Status: DC | PRN
  Administered 2019-06-29 (×2): 80 ug via INTRAVENOUS
  Administered 2019-06-29: 40 ug via INTRAVENOUS
  Administered 2019-06-29: 80 ug via INTRAVENOUS
  Administered 2019-06-29: 40 ug via INTRAVENOUS

## 2019-06-29 MED ORDER — FENTANYL CITRATE (PF) 100 MCG/2ML IJ SOLN: 50.0000 ug | INTRAMUSCULAR | Status: DC

## 2019-06-29 MED ORDER — PHENYLEPHRINE HCL-NACL 10-0.9 MG/250ML-% IV SOLN
INTRAVENOUS | Status: DC | PRN
  Administered 2019-06-29: 50 ug/min via INTRAVENOUS

## 2019-06-29 MED ORDER — PHENYLEPHRINE HCL (PRESSORS) 10 MG/ML IV SOLN
INTRAVENOUS | Status: AC
  Filled 2019-06-29: qty 1

## 2019-06-29 MED ORDER — METOCLOPRAMIDE HCL 5 MG/ML IJ SOLN: 5.0000 mg | Freq: Three times a day (TID) | INTRAMUSCULAR | Status: DC | PRN

## 2019-06-29 MED ORDER — MAGNESIUM CITRATE PO SOLN: 1.0000 | Freq: Once | ORAL | Status: DC | PRN

## 2019-06-29 MED ORDER — TRAMADOL HCL 50 MG PO TABS
50.0000 mg | ORAL_TABLET | Freq: Four times a day (QID) | ORAL | Status: DC
  Administered 2019-06-29 – 2019-07-04 (×19): 50 mg via ORAL
  Filled 2019-06-29 (×19): qty 1

## 2019-06-29 MED ORDER — DEXAMETHASONE SODIUM PHOSPHATE 10 MG/ML IJ SOLN
10.0000 mg | Freq: Once | INTRAMUSCULAR | Status: AC
  Administered 2019-06-30: 10 mg via INTRAVENOUS
  Filled 2019-06-29: qty 1

## 2019-06-29 MED ORDER — CHLORHEXIDINE GLUCONATE 4 % EX LIQD: 60.0000 mL | Freq: Once | CUTANEOUS | Status: DC

## 2019-06-29 MED ORDER — INSULIN ASPART 100 UNIT/ML ~~LOC~~ SOLN
0.0000 [IU] | Freq: Every day | SUBCUTANEOUS | Status: DC
  Administered 2019-06-29: 4 [IU] via SUBCUTANEOUS
  Administered 2019-06-30: 2 [IU] via SUBCUTANEOUS

## 2019-06-29 MED ORDER — MIDAZOLAM HCL 2 MG/2ML IJ SOLN: 1.0000 mg | INTRAMUSCULAR | Status: DC

## 2019-06-29 MED ORDER — MENTHOL 3 MG MT LOZG: 1.0000 | LOZENGE | OROMUCOSAL | Status: DC | PRN

## 2019-06-29 MED ORDER — DEXAMETHASONE SODIUM PHOSPHATE 10 MG/ML IJ SOLN
INTRAMUSCULAR | Status: AC
  Filled 2019-06-29: qty 1

## 2019-06-29 MED ORDER — METFORMIN HCL 500 MG PO TABS
500.0000 mg | ORAL_TABLET | Freq: Two times a day (BID) | ORAL | Status: DC
  Administered 2019-06-29 – 2019-07-01 (×4): 500 mg via ORAL
  Filled 2019-06-29 (×6): qty 1

## 2019-06-29 MED ORDER — PROPOFOL 10 MG/ML IV BOLUS
INTRAVENOUS | Status: AC
  Filled 2019-06-29: qty 20

## 2019-06-29 MED ORDER — ACETAMINOPHEN 325 MG PO TABS
325.0000 mg | ORAL_TABLET | Freq: Four times a day (QID) | ORAL | Status: DC | PRN
  Administered 2019-07-02 (×2): 650 mg via ORAL
  Filled 2019-06-29 (×2): qty 2

## 2019-06-29 MED ORDER — FENTANYL CITRATE (PF) 100 MCG/2ML IJ SOLN
INTRAMUSCULAR | Status: DC | PRN
  Administered 2019-06-29: 100 ug via INTRAVENOUS

## 2019-06-29 MED ORDER — ONDANSETRON HCL 4 MG/2ML IJ SOLN
INTRAMUSCULAR | Status: DC | PRN
  Administered 2019-06-29: 4 mg via INTRAVENOUS

## 2019-06-29 MED ORDER — TRAZODONE HCL 50 MG PO TABS: 50.0000 mg | ORAL_TABLET | Freq: Every evening | ORAL | Status: DC | PRN

## 2019-06-29 MED ORDER — MIDAZOLAM HCL 2 MG/2ML IJ SOLN
INTRAMUSCULAR | Status: AC
  Administered 2019-06-29: 1 mg via INTRAVENOUS
  Filled 2019-06-29: qty 2

## 2019-06-29 MED ORDER — LORATADINE 10 MG PO TABS
10.0000 mg | ORAL_TABLET | Freq: Every day | ORAL | Status: DC
  Administered 2019-06-30 – 2019-07-04 (×5): 10 mg via ORAL
  Filled 2019-06-29 (×5): qty 1

## 2019-06-29 SURGICAL SUPPLY — 40 items
BAG ZIPLOCK 12X15 (MISCELLANEOUS) ×3 IMPLANT
BLADE SAW SGTL 11.0X1.19X90.0M (BLADE) IMPLANT
BLADE SURG SZ10 CARB STEEL (BLADE) ×6 IMPLANT
BNDG COHESIVE 6X5 TAN STRL LF (GAUZE/BANDAGES/DRESSINGS) ×3 IMPLANT
BNDG ELASTIC 6X5.8 VLCR STR LF (GAUZE/BANDAGES/DRESSINGS) ×3 IMPLANT
BNDG GAUZE ELAST 4 BULKY (GAUZE/BANDAGES/DRESSINGS) ×3 IMPLANT
COVER SURGICAL LIGHT HANDLE (MISCELLANEOUS) ×3 IMPLANT
COVER WAND RF STERILE (DRAPES) IMPLANT
CUFF TOURN SGL QUICK 18X4 (TOURNIQUET CUFF) ×3 IMPLANT
DRAPE SURG 17X11 SM STRL (DRAPES) ×3 IMPLANT
DRSG PAD ABDOMINAL 8X10 ST (GAUZE/BANDAGES/DRESSINGS) ×3 IMPLANT
DURAPREP 26ML APPLICATOR (WOUND CARE) ×3 IMPLANT
ELECT REM PT RETURN 15FT ADLT (MISCELLANEOUS) ×3 IMPLANT
GAUZE SPONGE 4X4 12PLY STRL (GAUZE/BANDAGES/DRESSINGS) ×3 IMPLANT
GAUZE XEROFORM 5X9 LF (GAUZE/BANDAGES/DRESSINGS) ×3 IMPLANT
GLOVE BIOGEL M 7.0 STRL (GLOVE) IMPLANT
GLOVE BIOGEL PI IND STRL 7.5 (GLOVE) ×2 IMPLANT
GLOVE BIOGEL PI IND STRL 8.5 (GLOVE) ×2 IMPLANT
GLOVE BIOGEL PI INDICATOR 7.5 (GLOVE) ×1
GLOVE BIOGEL PI INDICATOR 8.5 (GLOVE) ×1
GLOVE ECLIPSE 8.0 STRL XLNG CF (GLOVE) IMPLANT
GLOVE ECLIPSE 8.5 STRL (GLOVE) ×3 IMPLANT
GLOVE ORTHO TXT STRL SZ7.5 (GLOVE) ×6 IMPLANT
GLOVE SURG ORTHO 8.0 STRL STRW (GLOVE) ×3 IMPLANT
GOWN STRL REUS W/TWL LRG LVL3 (GOWN DISPOSABLE) ×3 IMPLANT
GOWN STRL REUS W/TWL XL LVL3 (GOWN DISPOSABLE) ×6 IMPLANT
KIT BASIN OR (CUSTOM PROCEDURE TRAY) ×3 IMPLANT
KIT TURNOVER KIT A (KITS) IMPLANT
MANIFOLD NEPTUNE II (INSTRUMENTS) ×3 IMPLANT
NS IRRIG 1000ML POUR BTL (IV SOLUTION) ×3 IMPLANT
PACK ORTHO EXTREMITY (CUSTOM PROCEDURE TRAY) ×3 IMPLANT
PAD CAST 4YDX4 CTTN HI CHSV (CAST SUPPLIES) ×2 IMPLANT
PADDING CAST COTTON 4X4 STRL (CAST SUPPLIES) ×1
PENCIL SMOKE EVACUATOR (MISCELLANEOUS) IMPLANT
PROTECTOR NERVE ULNAR (MISCELLANEOUS) ×3 IMPLANT
SOL PREP POV-IOD 4OZ 10% (MISCELLANEOUS) ×3 IMPLANT
SOL PREP PROV IODINE SCRUB 4OZ (MISCELLANEOUS) ×3 IMPLANT
SUT PROLENE 3 0 PS 2 (SUTURE) ×3 IMPLANT
TOWEL OR 17X26 10 PK STRL BLUE (TOWEL DISPOSABLE) ×6 IMPLANT
WATER STERILE IRR 1000ML POUR (IV SOLUTION) ×3 IMPLANT

## 2019-06-29 NOTE — Evaluation (Signed)
Physical Therapy Evaluation Patient Details Name: Brittany Russo MRN: [id number] DOB: 1945/10/12 Today's Date: 06/29/2019  Clinical Impression [place] is 74 y.o. female admitted with below HPI and diagnosis. Patient is currently limited by functional impairments below (see PT problem list). Patient lives with her husband and has assist from husband, daughter, and aid for a few hours/day. Prior to TKA on 09/22/18 pt was modified independent with RW for mobility. After multiple revisions pt has been primarily mobilizing via wheelchair for ~ 6-8 months. Patient will benefit from continued skilled PT interventions to address impairments and progress independence with mobility, recommending CIR for intensive rehabilitation to progress functional mobility towards PLOF and progress towards mobility with prosthesis with LRAD. Acute PT will follow and progress as able.    06/29/19 1600  PT Visit Information  Last PT Received On 06/29/19  Assistance Needed +2  History of Present Illness Patient is 74 y.o. female s/p Rt AKA (knee disarticulation) on 06/29/19. Pt has significant history for Rt TKA on 09/22/18 with subsequent fall resulting in readmission and revision on 09/27/18. Patient with multiple re-admissions for GI bleed on 10/10/18, Rt knee extensor repair on 11/22/18 and excision of arthroplasty on 01/03/19. Pt PMH also includes HTN, DM, obestity, HLD, depression, anxiety, OA, asthma.  Precautions  Precautions Fall  Restrictions  Weight Bearing Restrictions Yes  RLE Weight Bearing NWB  Home Living  Family/patient expects to be discharged to: Private residence  Living Arrangements Spouse/significant other  Available Help at Discharge Family  Type of [place] Access Level entry;Elevator  [place] One level  Bathroom Shower/Tub Tub/shower unit  [place] Accessibility Yes  Home Equipment Wheelchair - manual;Adaptive equipment;BSC;Tub bench   Additional Comments pt reports she has a CNA come to assist her from 10am-1pm every day. She assists with bathing, dressing. pt reports he daughter helps as well and her husband.   Prior Function  Level of Independence Needs assistance  Gait / Transfers Assistance Needed Pt reports for at least 6 months (per chart reveiwe likely 8 months) pt has been mobilizing via wheelchair and is not ambulating in home.  She reports she uses a transfer board to moved fro wheelchair to tub bench, bed, or chairs.  ADL's / Homemaking Assistance Needed She uses a grab bar in the bathroom to stand on Lt LE and family/aid moves [place] behind her for toileting. Pt is unable to pivot with RW to sit on toilet.  Patient states in shower she is independent to wash herself.   Comments Pt was mod indep with intermittent use of RW prior to TKA (09/22/18); since fall and RLE fx and revision on 09/27/18 pt was d/c to SNF for rehab and spent several months there.   Communication  Communication No difficulties  Pain Assessment  Pain Assessment Faces  Faces Pain Scale 6  Pain Location Rt residual limb  Pain Descriptors / Indicators Aching;Burning;Grimacing  Pain Intervention(s) Limited activity within patient's tolerance;Monitored during session;Repositioned  Cognition  Arousal/Alertness Awake/alert  Behavior During Therapy WFL for tasks assessed/performed  Overall Cognitive Status Within Functional Limits for tasks assessed  Upper Extremity Assessment  Upper Extremity Assessment Generalized weakness  Lower Extremity Assessment  Lower Extremity Assessment Generalized weakness;LLE deficits/detail  LLE Deficits / Details 2+ for hip flexion, 3/5 for knee extension  Bed Mobility  Overal bed mobility Needs Assistance  Bed Mobility Supine to Sit;Sit to Supine  Supine to sit Mod assist;HOB elevated  Sit to supine  Mod assist;Max assist  General bed mobility comments Cues for use of bed rail and assist to pivot wtih bed sheet. Cues  for pt to press bil thighs into bed to sit EOB and maintain balance. Mod-Max assist to raise LE's and control trunk lowering.  Transfers  Overall transfer level Needs assistance  Transfers Lateral/Scoot Transfers   Lateral/Scoot Transfers Mod assist  General transfer comment Pt performed 3x bil UE press up with buttock clearance for lateral scoot practice. pt required blocking of Lt LE for foot placement on floor.  Balance  Overall balance assessment Needs assistance  Sitting-balance support Bilateral upper extremity supported;Feet supported  Sitting balance-Leahy Scale Poor  Sitting balance - Comments pt reliant on UE support   Exercises  Exercises Amputee  Amputee Exercises  Hip Extension AROM;Right;Supine;Seated (5 reps supine, 5 reps seated )  Hip ABduction/ADduction Right;AROM;5 reps;Supine  PT - End of Session  Activity Tolerance Patient tolerated treatment well;Patient limited by pain (pain increased in sitting EOB)  Patient left in bed;with call bell/phone within reach;with bed alarm set  Nurse Communication Mobility status  PT Assessment  PT Recommendation/Assessment Patient needs continued PT services  PT Visit Diagnosis Muscle weakness (generalized) (M62.81);Difficulty in walking, not elsewhere classified (R26.2);Unsteadiness on feet (R26.81);Pain  Pain - Right/Left Right  Pain - part of body Leg  PT Problem List Decreased strength;Decreased range of motion;Decreased activity tolerance;Decreased balance;Decreased mobility;Decreased knowledge of use of DME;Decreased knowledge of precautions;Pain  PT Plan  PT Frequency (ACUTE ONLY) Min 5X/week  PT Treatment/Interventions (ACUTE ONLY) DME instruction;Gait training;Stair training;Functional mobility training;Therapeutic activities;Therapeutic exercise;Balance training;Patient/family education;Wheelchair mobility training  AM-PAC PT "6 Clicks" Mobility Outcome Measure (Version 2)  Help needed turning from your back to your side  while in a flat bed without using bedrails? 2  Help needed moving from lying on your back to sitting on the side of a flat bed without using bedrails? 2  Help needed moving to and from a bed to a chair (including a wheelchair)? 1  Help needed standing up from a chair using your arms (e.g., wheelchair or bedside chair)? 1  Help needed to walk in hospital room? 1  Help needed climbing 3-5 steps with a railing?  1  6 Click Score 8  Consider Recommendation of Discharge To: CIR/SNF/LTACH  PT Recommendation  Recommendations for Other Services OT consult  Follow Up Recommendations CIR  PT equipment None recommended by PT (pt has DME)  Individuals Consulted  Consulted and Agree with Results and Recommendations Patient  Acute Rehab PT Goals  Patient Stated Goal to recover and get home  PT Goal Formulation With patient  Time For Goal Achievement 07/06/19  Potential to Achieve Goals Good  PT Time Calculation  PT Start Time (ACUTE ONLY) 1604  PT Stop Time (ACUTE ONLY) 1648  PT Time Calculation (min) (ACUTE ONLY) 44 min  PT General Charges  $$ ACUTE PT VISIT 1 Visit  PT Evaluation  $PT Eval Moderate Complexity 1 Mod  PT Treatments  $Therapeutic Exercise 8-22 mins  $Therapeutic Activity 8-22 mins  Written Expression  Dominant Hand Right    Verner Mould, DPT Physical Therapist with [facility] [phone]  06/29/2019 8:25 PM

## 2019-06-29 NOTE — Anesthesia Procedure Notes (Addendum)
Anesthesia Regional Block: Femoral nerve block   Pre-Anesthetic Checklist: ,, timeout performed, Correct Patient, Correct Site, Correct Laterality, Correct Procedure, Correct Position, site marked, Risks and benefits discussed,  Surgical consent,  Pre-op evaluation,  At surgeon's request and post-op pain management  Laterality: Right  Prep: chloraprep       Needles:  Injection technique: Single-shot  Needle Type: Other     Needle Length: 9cm  Needle Gauge: 20     Additional Needles:   Procedures:, nerve stimulator,,, ultrasound used (permanent image in chart),,,,   Nerve Stimulator or Paresthesia:  Response: quadraceps contraction, 0.45 mA,   Additional Responses:   Narrative:  Start time: 06/29/2019 7:51 AM End time: 06/29/2019 8:01 AM Injection made incrementally with aspirations every 5 mL.  Performed by: Personally  Anesthesiologist: Lillia Abed, MD  Additional Notes: Monitors applied. Patient sedated. Sterile prep and drape,hand hygiene and sterile gloves were used. Relevant anatomy identified.Needle position confirmed.Local anesthetic injected incrementally after negative aspiration. Local anesthetic spread visualized around nerve(s). Vascular puncture avoided. No complications. Image printed for medical record.The patient tolerated the procedure well.

## 2019-06-29 NOTE — Op Note (Signed)
NAMEETHELLE, OLA MEDICAL RECORD [id number] ACCOUNT [id number] DATE OF BIRTH:07/13/1945 FACILITY: [facility] LOCATION: [id number] PHYSICIAN:Willine Schwalbe Marian Sorrow, MD  OPERATIVE REPORT  DATE OF PROCEDURE:  06/29/2019  PREOPERATIVE DIAGNOSES:   1.  Failed right total knee replacement with multiple surgical procedures attempt to salvage her joint following index total knee replacement complicated by very distal periprosthetic femur fracture requiring revision to a constrained liner with ultimate  disruption of her extensor mechanism and deemed failure of her knee with inability to revise. 2.  Left knee osteoarthritis and pain.  POSTOPERATIVE DIAGNOSES:   1.  Failed right total knee replacement with multiple surgical procedures attempt to salvage her joint following index total knee replacement complicated by very distal periprosthetic femur fracture requiring revision to a constrained liner with ultimate  disruption of her extensor mechanism and deemed failure of her knee with inability to revise. 2.  Left knee osteoarthritis and pain.  PROCEDURE:   1.  Right above-the-knee amputation. 2.  Left knee intra-articular cortisone injection with 80 mg of Depo-Medrol and Marcaine.  SURGEON:  Paralee Cancel, MD  ASSISTANT:  Griffith Citron, PA-C  ANESTHESIA:  Regional plus general on the right lower extremity.  ESTIMATED BLOOD LOSS:  Less than 200 mL.  TOURNIQUET:  Up for 56 minutes at 250 mmHg.  COMPLICATIONS:  None apparent.  DRAINS:  None.  INDICATIONS:  The patient is a pleasant 74 year old female who underwent a right total knee replacement within the last year.  Unfortunately, she had an early multiple falls within the first 2 days upon discharge resulting in a significantly distal  femoral periprosthetic fracture.  This resulted in shearing off of the distal femur with the only option to try to revise her to a hinged prosthesis.  Following this procedure related to perhaps just her overall  medical condition she had an eventual  deterioration/disruption of her patellar tendon.  Attempt at surgical repair revealed that the tissue was extremely unhealthy and unable to be restored.  Based on this, this hinged component had deemed to fail.  I ultimately went back to the operating  room with her due to concern for infection and tension on the skin with regards to her prosthesis and removed her prosthesis.  Unfortunately, this prosthesis had been in for several months and resulted in need for significant osteotomy of the bone in  order to remove it.  At this point, she was left without a knee replacement and an extensor mechanism based on the deterioration of her soft tissues distal patella.  We had lengthy discussions in the office on how to proceed when she healed and recovered  not only from her knee surgeries, but also mentally and medically from her medical comorbidities.  We ultimately decided after significant discussion to proceed with an above-the-knee amputation as opposed to any other attempt at salvaging her joint or  fusion.  The multiple discussions included the pros and cons of the options available.  She, most importantly in addition to her family, decided to proceed with amputation above the knee.  Consent was obtained after reviewing the pros and cons, the risks  and benefits, and the postoperative course and expectations.  DESCRIPTION OF PROCEDURE:  The patient was brought to the operative theater.  Once adequate anesthesia, preoperative antibiotics administered.  She was positioned supine.  We placed a right thigh tourniquet proximal as we could get it.  The right lower  extremity was prepped and draped in sterile fashion.  A timeout was performed identifying the  patient, the planned procedure including the right knee procedure as well as a left knee intra-articular injection.  Following the timeout, we exsanguinated the  leg and elevated the tourniquet to 250 mmHg.  The  tourniquet was up for a total of 56 minutes.  At this point, we proceed with amputation of the right lower extremity.  An incision was made with an anterior limb based on her anatomy, extended medial and  lateral proximal in a fish mouth pattern and then extended posteriorly.  Soft tissue dissection was carried down to the muscle fascia.  Based on prior procedures the anterior aspect of the knee was significantly scarred in.  Her patella had been  significantly scarred in proximally.  I removed her patella as well as debrided some of the soft tissues and scar.  I was eventually able to expose her distal femur.  I elevated the soft tissues off the distal femur to an area of resection, which I chose  to be at an area just proximal to the level of prior osteotomy and bone healing.  Once I had this exposed I worked around the medial and lateral aspect and the anterior aspect of the knee.  Medially, we identified the femoral artery and vein, probably  at this point distal to the adductor canal based on the contractures and the anatomy that was palpable.  The artery and vein were suture ligated with two 2-0 silk sutures.  Once I had these ligated they were cut.  I finished the dissection through her  muscle medial and then posteriorly.  Once this was done, we used an oscillating saw and then amputated the distal femur again above the area of her prior osteotomy and fracture healing.  The edges were smoothed out with the use of the saw and a rasp.   Once this was done, we completed the amputation of the soft tissues posteriorly.  The sciatic nerve was identified and under gentle traction the nerve was cut to allow it to retract posteriorly.  At this point, we irrigated the wound.  We then evaluated  the soft tissues.  I was able to reapproximate her anterior quadricep musculature to the posterior muscular fascia as well as her medial muscular fascia that was remaining.  Again, there was no obvious abductor tendon.   However, I could palpate tendon  just above the area of her osteotomy.  Once I had the muscle over top of the bone and reapproximated we let the tourniquet down.  There was no significant hemostasis.  Any punctate bleeding was cauterized.  Once this was done, we debulked some of the fat  in the distal aspect of the femur and then reapproximated the skin using 2-0 Vicryl.  We then used staples on the skin.  The skin was then cleaned, dried and dressed sterilely with staples and  Xeroform dressing and a bulky dressing.  Once this was  done, her left knee was prepped over the superior lateral aspect of the joint and injected with 80 mg of Depo-Medrol and Marcaine.  She was then awoken from anesthesia and brought to the recovery room in stable condition.  Findings and procedure were reviewed with her family.  She will be admitted to the hospital.  She will most likely be transferred to the [facility] for the initiation of amputation protocol, stump shrinkage, and fitting for prosthesis.  CN/NUANCE  D:06/29/2019 T:06/29/2019 JOB:[id number]

## 2019-06-29 NOTE — Brief Op Note (Signed)
06/29/2019  10:34 AM  PATIENT:  Brittany Russo  74 y.o. female  PRE-OPERATIVE DIAGNOSIS:  1. Failed right knee, status post resection of total knee arthroplasty, 2.  Left knee osteoarthritis  POST-OPERATIVE DIAGNOSIS:  1. Failed right knee, status post resection of total knee arthroplasty, 2.  Left knee osteoarthritis   PROCEDURE:  Procedure(s) with comments: AMPUTATION ABOVE KNEE through knee (Right) - 2 hrs LEFT KNEE INJECTION (Left)  SURGEON:  Surgeon(s) and Role:    Paralee Cancel, MD - Primary  PHYSICIAN ASSISTANT: Griffith Citron, PA-C  ANESTHESIA:   regional and general  EBL:  <200cc   BLOOD ADMINISTERED:none  DRAINS: none   LOCAL MEDICATIONS USED:  MARCAINE and Depomedrol into the left knee  SPECIMEN:  No Specimen  DISPOSITION OF SPECIMEN:  N/A  COUNTS:  YES  TOURNIQUET:   Total Tourniquet Time Documented: Thigh (Right) - 56 minutes Total: Thigh (Right) - 56 minutes   DICTATION: .Other Dictation: Dictation Number [id number]  PLAN OF CARE: Admit to inpatient   PATIENT DISPOSITION:  PACU - hemodynamically stable.   Delay start of Pharmacological VTE agent (>24hrs) due to surgical blood loss or risk of bleeding: no

## 2019-06-29 NOTE — Anesthesia Postprocedure Evaluation (Signed)
Anesthesia Post Note  Patient: Brittany Russo  Procedure(s) Performed: AMPUTATION ABOVE KNEE through knee (Right Knee) LEFT KNEE INJECTION (Left )     Patient location during evaluation: PACU Anesthesia Type: General Level of consciousness: sedated Pain management: pain level controlled Vital Signs Assessment: post-procedure vital signs reviewed and stable Respiratory status: spontaneous breathing and respiratory function stable Cardiovascular status: stable Postop Assessment: no apparent nausea or vomiting Anesthetic complications: no    Last Vitals:  Vitals:   06/29/19 1130 06/29/19 1147  BP: 136/63 140/62  Pulse: 67 66  Resp: 18 12  Temp: 36.6 C   SpO2: 98%     Last Pain:  Vitals:   06/29/19 1130  TempSrc:   PainSc: 0-No pain                 Diogenes Whirley DANIEL

## 2019-06-29 NOTE — Progress Notes (Signed)
Assisted Dr. Tobias Alexander with right, ultrasound guided, femoral block. Side rails up, monitors on throughout procedure. See vital signs in flow sheet. Tolerated Procedure well.

## 2019-06-29 NOTE — Anesthesia Procedure Notes (Signed)
Procedure Name: Intubation Date/Time: 06/29/2019 8:51 AM Performed by: Deliah Boston, CRNA Pre-anesthesia Checklist: Patient identified, Emergency Drugs available, Suction available and Patient being monitored Patient Re-evaluated:Patient Re-evaluated prior to induction Oxygen Delivery Method: Circle system utilized Preoxygenation: Pre-oxygenation with 100% oxygen Induction Type: IV induction Ventilation: Mask ventilation without difficulty Laryngoscope Size: Mac and 3 Grade View: Grade I Tube type: Oral Tube size: 7.0 mm Number of attempts: 1 Airway Equipment and Method: Stylet and Oral airway Placement Confirmation: ETT inserted through vocal cords under direct vision,  positive ETCO2 and breath sounds checked- equal and bilateral Secured at: 21 cm Tube secured with: Tape Dental Injury: Teeth and Oropharynx as per pre-operative assessment

## 2019-06-29 NOTE — Transfer of Care (Signed)
Immediate Anesthesia Transfer of Care Note  Patient: Brittany Russo  Procedure(s) Performed: Procedure(s) with comments: AMPUTATION ABOVE KNEE through knee (Right) - 2 hrs LEFT KNEE INJECTION (Left)  Patient Location: PACU  Anesthesia Type:General and Regional  Level of Consciousness: Patient easily awoken, sedated, comfortable, cooperative, following commands, responds to stimulation.   Airway & Oxygen Therapy: Patient spontaneously breathing, ventilating well, oxygen via simple oxygen mask.  Post-op Assessment: Report given to PACU RN, vital signs reviewed and stable, moving all extremities.   Post vital signs: Reviewed and stable.  Complications: No apparent anesthesia complications  Last Vitals:  Vitals Value Taken Time  BP 149/59 06/29/19 1053  Temp    Pulse 64 06/29/19 1055  Resp 17 06/29/19 1055  SpO2 100 % 06/29/19 1055  Vitals shown include unvalidated device data.  Last Pain:  Vitals:   06/29/19 0714  TempSrc: Oral      Patients Stated Pain Goal: 3 (29/19/16 6060)  Complications: No apparent anesthesia complications

## 2019-06-29 NOTE — Discharge Instructions (Signed)
Information on my medicine - XARELTO (Rivaroxaban)   Why was Xarelto prescribed for you? Xarelto was prescribed for you to reduce the risk of blood clots forming after orthopedic surgery. The medical term for these abnormal blood clots is venous thromboembolism (VTE).  What do you need to know about xarelto ? Take your Xarelto ONCE DAILY at the same time every day. You may take it either with or without food.  If you have difficulty swallowing the tablet whole, you may crush it and mix in applesauce just prior to taking your dose.  Take Xarelto exactly as prescribed by your doctor and DO NOT stop taking Xarelto without talking to the doctor who prescribed the medication.  Stopping without other VTE prevention medication to take the place of Xarelto may increase your risk of developing a clot.  After discharge, you should have regular check-up appointments with your healthcare provider that is prescribing your Xarelto.    What do you do if you miss a dose? If you miss a dose, take it as soon as you remember on the same day then continue your regularly scheduled once daily regimen the next day. Do not take two doses of Xarelto on the same day.   Important Safety Information A possible side effect of Xarelto is bleeding. You should call your healthcare provider right away if you experience any of the following: ? Bleeding from an injury or your nose that does not stop. ? Unusual colored urine (red or dark brown) or unusual colored stools (red or black). ? Unusual bruising for unknown reasons. ? A serious fall or if you hit your head (even if there is no bleeding).  Some medicines may interact with Xarelto and might increase your risk of bleeding while on Xarelto. To help avoid this, consult your healthcare provider or pharmacist prior to using any new prescription or non-prescription medications, including herbals, vitamins, non-steroidal anti-inflammatory drugs (NSAIDs) and  supplements.  This website has more information on Xarelto: [email or web address]

## 2019-06-30 LAB — CBC
HCT: 26.2 % — ABNORMAL LOW (ref 36.0–46.0)
Hemoglobin: 8 g/dL — ABNORMAL LOW (ref 12.0–15.0)
MCH: 28.8 pg (ref 26.0–34.0)
MCHC: 30.5 g/dL (ref 30.0–36.0)
MCV: 94.2 fL (ref 80.0–100.0)
Platelets: 43 10*3/uL — ABNORMAL LOW (ref 150–400)
RBC: 2.78 MIL/uL — ABNORMAL LOW (ref 3.87–5.11)
RDW: 17.7 % — ABNORMAL HIGH (ref 11.5–15.5)
WBC: 4.8 10*3/uL (ref 4.0–10.5)
nRBC: 0 % (ref 0.0–0.2)

## 2019-06-30 LAB — BASIC METABOLIC PANEL
Anion gap: 10 (ref 5–15)
BUN: 15 mg/dL (ref 8–23)
CO2: 25 mmol/L (ref 22–32)
Calcium: 8.2 mg/dL — ABNORMAL LOW (ref 8.9–10.3)
Chloride: 99 mmol/L (ref 98–111)
Creatinine, Ser: 0.81 mg/dL (ref 0.44–1.00)
GFR calc Af Amer: >60 mL/min (ref >60)
GFR calc non Af Amer: >60 mL/min (ref >60)
Glucose, Bld: 225 mg/dL — ABNORMAL HIGH (ref 70–99)
Potassium: 3.8 mmol/L (ref 3.5–5.1)
Sodium: 134 mmol/L — ABNORMAL LOW (ref 135–145)

## 2019-06-30 LAB — GLUCOSE, CAPILLARY
Glucose-Capillary: 169 mg/dL — ABNORMAL HIGH (ref 70–99)
Glucose-Capillary: 199 mg/dL — ABNORMAL HIGH (ref 70–99)
Glucose-Capillary: 228 mg/dL — ABNORMAL HIGH (ref 70–99)
Glucose-Capillary: 203 mg/dL — ABNORMAL HIGH (ref 70–99)

## 2019-06-30 MED ORDER — LIP MEDEX EX OINT: TOPICAL_OINTMENT | CUTANEOUS | Status: DC | PRN

## 2019-06-30 NOTE — Progress Notes (Addendum)
     Subjective: 1 Day Post-Op Procedure(s) (LRB): AMPUTATION ABOVE KNEE through knee (Right) LEFT KNEE INJECTION (Left)   Patient reports pain as mild, pain controlled with medication.  Dr. Alvan Dame described the procedure, findings and expectations moving forward.  CIR consult to be placed as a plan for after her hospital stay to help in the rehab/recovery of her surgery.     Objective:   VITALS:   Vitals:   06/30/19 0150 06/30/19 0600  BP: (!) 127/53 (!) 122/53  Pulse: 79 78  Resp: 16 14  Temp: 98.3 F (36.8 C) 98 F (36.7 C)  SpO2: 95% 99%    Incision: no drainage No cellulitis present Compartment soft  LABS Recent Labs    06/30/19 0345  HGB 8.0*  HCT 26.2*  WBC 4.8  PLT 43*    Recent Labs    06/30/19 0345  NA 134*  K 3.8  BUN 15  CREATININE 0.81  GLUCOSE 225*     Assessment/Plan: 1 Day Post-Op Procedure(s) (LRB): AMPUTATION ABOVE KNEE through knee (Right) LEFT KNEE INJECTION (Left) Try to minimize analgesic medication as she has had confusion after previous surgeries Advance diet Up with therapy D/C IV fluids Discharge to CIR once ready and approved        Danae Orleans PA-C  [facility]  [place] [address], [place], [place] [address] Phone: [phone] [email or web address] Facebook  [employer]

## 2019-06-30 NOTE — Progress Notes (Signed)
Inpatient Rehab Admissions:  Inpatient Rehab Consult received.  I met with patient at the bedside for rehabilitation assessment and to discuss goals and expectations of an inpatient rehab admission.  I also discussed with her daughter, [name], over the phone.  They are able to provide adequate support at home and hopeful for CIR admission next week, pending bed availability and medical readiness.   Signed: Shann Medal, PT, DPT Admissions Coordinator [phone] 06/30/19  3:06 PM

## 2019-06-30 NOTE — Progress Notes (Signed)
Physical Therapy Treatment Patient Details Name: Brittany Russo MRN: [id number] DOB: [birth year] Today's Date: 06/30/2019    History of Present Illness Patient is 74 y.o. female s/p Rt AKA (knee disarticulation) on 06/29/19. Pt has significant history for Rt TKA on 09/22/18 with subsequent fall resulting in readmission and revision on 09/27/18. Patient with multiple re-admissions for GI bleed on 10/10/18, Rt knee extensor repair on 11/22/18 and excision of arthroplasty on 01/03/19. Pt PMH also includes HTN, DM, obestity, HLD, depression, anxiety, OA, asthma.    PT Comments    POD # 1  Assisted pt OOB to her wheelchair.  General transfer comment: applied L AFO/shoe and assisted with a lateral scoot from elevated bed to drop arm wheelchair + 2 assist (anterior/posterior) pt able 30% to self assist. CIR rep entered room so left pt in wheelchair with call light in reach.   Follow Up Recommendations  CIR     Equipment Recommendations  None recommended by PT    Recommendations for Other Services       Precautions / Restrictions Precautions Precautions: Fall Precaution Comments: R AKA, like to wear her L AFO/shoe Restrictions Weight Bearing Restrictions: Yes RLE Weight Bearing: Non weight bearing    Mobility  Bed Mobility Overal bed mobility: Needs Assistance Bed Mobility: Supine to Sit     Supine to sit: Mod assist;Max assist     General bed mobility comments: required increased assist this session due to pain  Transfers Overall transfer level: Needs assistance   Transfers: Lateral/Scoot Transfers           General transfer comment: applied L AFO/shoe and assisted with a lateral scoot from elevated bed to drop arm wheelchair + 2 assist (anterior/posterior) pt able 30% to self assist  Ambulation/Gait                 Stairs             Wheelchair Mobility    Modified Rankin (Stroke Patients Only)       Balance                                             Cognition Arousal/Alertness: Awake/alert Behavior During Therapy: WFL for tasks assessed/performed Overall Cognitive Status: Within Functional Limits for tasks assessed                                        Exercises      General Comments        Pertinent Vitals/Pain Pain Assessment: 0-10 Faces Pain Scale: Hurts even more Pain Location: Rt residual limb Pain Descriptors / Indicators: Aching;Burning;Grimacing Pain Intervention(s): Monitored during session    Home Living                      Prior Function            PT Goals (current goals can now be found in the care plan section) Progress towards PT goals: Progressing toward goals    Frequency    Min 5X/week      PT Plan Current plan remains appropriate    Co-evaluation              AM-PAC PT "6 Clicks" Mobility   Outcome Measure  Help needed  turning from your back to your side while in a flat bed without using bedrails?: A Lot Help needed moving from lying on your back to sitting on the side of a flat bed without using bedrails?: A Lot Help needed moving to and from a bed to a chair (including a wheelchair)?: Total Help needed standing up from a chair using your arms (e.g., wheelchair or bedside chair)?: Total Help needed to walk in hospital room?: Total Help needed climbing 3-5 steps with a railing? : Total 6 Click Score: 8    End of Session Equipment Utilized During Treatment: Gait belt Activity Tolerance: Patient limited by fatigue;Patient limited by pain Patient left: Other (comment)(personal wheelchair) Nurse Communication: Mobility status PT Visit Diagnosis: Muscle weakness (generalized) (M62.81);Difficulty in walking, not elsewhere classified (R26.2);Unsteadiness on feet (R26.81);Pain Pain - Right/Left: Right Pain - part of body: Leg     Time: 7096-4383 PT Time Calculation (min) (ACUTE ONLY): 17 min  Charges:  $Therapeutic Activity: 8-22  mins                     Rica Koyanagi  PTA Acute  Rehabilitation Services Pager      [phone] Office      [phone]

## 2019-06-30 NOTE — Progress Notes (Signed)
Physical Therapy Treatment Patient Details Name: Brittany Russo MRN: [id number] DOB: April 06, 1945 Today's Date: 06/30/2019    History of Present Illness Patient is 74 y.o. female s/p Rt AKA (knee disarticulation) on 06/29/19. Pt has significant history for Rt TKA on 09/22/18 with subsequent fall resulting in readmission and revision on 09/27/18. Patient with multiple re-admissions for GI bleed on 10/10/18, Rt knee extensor repair on 11/22/18 and excision of arthroplasty on 01/03/19. Pt PMH also includes HTN, DM, obestity, HLD, depression, anxiety, OA, asthma.    PT Comments    Assisted from personal wheelchair to recliner.  General transfer comment: assisted with a lateral scoot from drop arm wheelchair to drop arm recliner + 2 assist (anterior/posterior) pt able 25% to self assist increased fatigue.  Positioned in recliner to comfort.   Follow Up Recommendations  CIR     Equipment Recommendations  None recommended by PT    Recommendations for Other Services       Precautions / Restrictions Precautions Precautions: Fall Precaution Comments: R AKA, like to wear her L AFO/shoe Restrictions Weight Bearing Restrictions: Yes RLE Weight Bearing: Non weight bearing    Mobility  Bed Mobility Overal bed mobility: Needs Assistance Bed Mobility: Supine to Sit     Supine to sit: Mod assist;Max assist     General bed mobility comments: Pt OOB in recliner  Transfers Overall transfer level: Needs assistance   Transfers: Lateral/Scoot Transfers          Lateral/Scoot Transfers: Max assist General transfer comment: assisted with a lateral scoot from drop arm wheelchair to drop arm recliner + 2 assist (anterior/posterior) pt able 25% to self assist increased fatigue  Ambulation/Gait                 Stairs             Wheelchair Mobility    Modified Rankin (Stroke Patients Only)       Balance                                            Cognition  Arousal/Alertness: Awake/alert Behavior During Therapy: WFL for tasks assessed/performed Overall Cognitive Status: Within Functional Limits for tasks assessed                                        Exercises      General Comments        Pertinent Vitals/Pain Pain Assessment: 0-10 Faces Pain Scale: Hurts even more Pain Location: Rt residual limb Pain Descriptors / Indicators: Aching;Burning;Grimacing Pain Intervention(s): Monitored during session    Home Living                      Prior Function            PT Goals (current goals can now be found in the care plan section) Progress towards PT goals: Progressing toward goals    Frequency    Min 5X/week      PT Plan Current plan remains appropriate    Co-evaluation              AM-PAC PT "6 Clicks" Mobility   Outcome Measure  Help needed turning from your back to your side while in a flat bed without using bedrails?:  A Lot Help needed moving from lying on your back to sitting on the side of a flat bed without using bedrails?: A Lot Help needed moving to and from a bed to a chair (including a wheelchair)?: Total Help needed standing up from a chair using your arms (e.g., wheelchair or bedside chair)?: Total Help needed to walk in hospital room?: Total Help needed climbing 3-5 steps with a railing? : Total 6 Click Score: 8    End of Session Equipment Utilized During Treatment: Gait belt Activity Tolerance: Patient limited by fatigue;Patient limited by pain Patient left: in chair;with call bell/phone within reach;with chair alarm set Nurse Communication: Mobility status PT Visit Diagnosis: Muscle weakness (generalized) (M62.81);Difficulty in walking, not elsewhere classified (R26.2);Unsteadiness on feet (R26.81);Pain Pain - Right/Left: Right Pain - part of body: Leg     Time: 8325-4982 PT Time Calculation (min) (ACUTE ONLY): 17 min  Charges:  $Therapeutic Activity: 8-22  mins                     Rica Koyanagi  PTA Acute  Rehabilitation Services Pager      [phone] Office      [phone]

## 2019-06-30 NOTE — Progress Notes (Signed)
Brittany Russo w/ CIR says she will see the patient today.

## 2019-07-01 LAB — CBC
HCT: 25.6 % — ABNORMAL LOW (ref 36.0–46.0)
Hemoglobin: 8.2 g/dL — ABNORMAL LOW (ref 12.0–15.0)
MCH: 30.3 pg (ref 26.0–34.0)
MCHC: 32 g/dL (ref 30.0–36.0)
MCV: 94.5 fL (ref 80.0–100.0)
Platelets: 53 10*3/uL — ABNORMAL LOW (ref 150–400)
RBC: 2.71 MIL/uL — ABNORMAL LOW (ref 3.87–5.11)
RDW: 18.4 % — ABNORMAL HIGH (ref 11.5–15.5)
WBC: 6.8 10*3/uL (ref 4.0–10.5)
nRBC: 0 % (ref 0.0–0.2)

## 2019-07-01 LAB — BASIC METABOLIC PANEL
Anion gap: 10 (ref 5–15)
BUN: 23 mg/dL (ref 8–23)
CO2: 25 mmol/L (ref 22–32)
Calcium: 8.4 mg/dL — ABNORMAL LOW (ref 8.9–10.3)
Chloride: 97 mmol/L — ABNORMAL LOW (ref 98–111)
Creatinine, Ser: 0.9 mg/dL (ref 0.44–1.00)
GFR calc Af Amer: >60 mL/min (ref >60)
GFR calc non Af Amer: >60 mL/min (ref >60)
Glucose, Bld: 152 mg/dL — ABNORMAL HIGH (ref 70–99)
Potassium: 4.2 mmol/L (ref 3.5–5.1)
Sodium: 132 mmol/L — ABNORMAL LOW (ref 135–145)

## 2019-07-01 LAB — GLUCOSE, CAPILLARY
Glucose-Capillary: 98 mg/dL (ref 70–99)
Glucose-Capillary: 39 mg/dL — CL (ref 70–99)
Glucose-Capillary: 42 mg/dL — CL (ref 70–99)
Glucose-Capillary: 54 mg/dL — ABNORMAL LOW (ref 70–99)
Glucose-Capillary: 64 mg/dL — ABNORMAL LOW (ref 70–99)
Glucose-Capillary: 75 mg/dL (ref 70–99)
Glucose-Capillary: 81 mg/dL (ref 70–99)
Glucose-Capillary: 126 mg/dL — ABNORMAL HIGH (ref 70–99)

## 2019-07-01 MED ORDER — GLUCOSE 40 % PO GEL
ORAL | Status: AC
  Administered 2019-07-01: 1
  Filled 2019-07-01: qty 1

## 2019-07-01 NOTE — Progress Notes (Addendum)
Noon CBG was 39, then repeated at 38.  NT gave 2 servings of orange juice and it came up to 42 at 1235.  I talked with the Pharmacist, Dorian Pod, and then gave 1 tube of glucagel which is equivalent to 15 gms of carbohydrates.  By 1250, CBG was up to 54.    With no further intervention, at 0108, CBG was 64.  Called PA [place] with an update.  She recommended encouraging po intake to get up to 70 and then if this is unsuccessful to do another tube of the glucagel.

## 2019-07-01 NOTE — Progress Notes (Signed)
   Subjective: 2 Days Post-Op Procedure(s) (LRB): AMPUTATION ABOVE KNEE through knee (Right) LEFT KNEE INJECTION (Left) Patient reports pain as mild.   Patient seen in rounds for Dr. Alvan Dame. Patient has no complaints other than increased pain with movement and rolling in bed. Plan is to go to CIR after hospital stay, most likely on Monday.  Objective: Vital signs in last 24 hours: Temp:  [98.4 F (36.9 C)-98.6 F (37 C)] 98.4 F (36.9 C) (04/10 0515) Pulse Rate:  [78-81] 80 (04/10 0515) Resp:  [12-17] 16 (04/10 0515) BP: (119-127)/(46-80) 127/80 (04/10 0515) SpO2:  [91 %-96 %] 93 % (04/10 0515)  Intake/Output from previous day:  Intake/Output Summary (Last 24 hours) at 07/01/2019 0741 Last data filed at 07/01/2019 0600 Gross per 24 hour  Intake 840 ml  Output 350 ml  Net 490 ml    Labs: Recent Labs    06/30/19 0345 07/01/19 0259  HGB 8.0* 8.2*   Recent Labs    06/30/19 0345 07/01/19 0259  WBC 4.8 6.8  RBC 2.78* 2.71*  HCT 26.2* 25.6*  PLT 43* 53*   Recent Labs    06/30/19 0345 07/01/19 0259  NA 134* 132*  K 3.8 4.2  CL 99 97*  CO2 25 25  BUN 15 23  CREATININE 0.81 0.90  GLUCOSE 225* 152*  CALCIUM 8.2* 8.4*   Exam: General - Patient is Alert and Oriented Extremity - Neurologically intact Neurovascular intact Dressing/Incision - clean, dry and intact  Past Medical History:  Diagnosis Date  . Acute and subacute hepatic failure without coma   . Allergy   . Anasarca   . Anxiety   . Arthritis   . Ascites 09/2018   no SBP on 850 cc tap 10/13/18  . Asthma    allery induced  . Cirrhosis of liver ([place]) ~ 2004   from NAFLD.  Dx ~ 2004.  Dr Dorcas Mcmurray at [facility]/    . Depression   . Diabetes mellitus without complication ([place])    type 2  . Eczema of both hands   . Endometrial cancer ([place]) 1996, 2003   hysterectomy 1996.  recurrence 2003, treated with radiation.    . Generalized edema   . GI hemorrhage   . Hematochezia 2009   radiation proctosigmoiditis  on colonoscopy 06/2007, DR Allyson Sabal Mir at [facility] in [place]  . Hyperlipidemia   . Hypertension   . Muscle weakness   . Seizures ([place])    last seizure June 07, 2014   . Thrombocytopenia ([place])   . Thyroid nodule     Assessment/Plan: 2 Days Post-Op Procedure(s) (LRB): AMPUTATION ABOVE KNEE through knee (Right) LEFT KNEE INJECTION (Left) Active Problems:   S/P right knee disarticulation amputation   Above knee amputation of right lower extremity (HCC)  Estimated body mass index is 26.13 kg/m as calculated from the following:   Height as of this encounter: 5' 5"  (1.651 m).   Weight as of this encounter: 71.2 kg. Will continue working with therapy  DVT Prophylaxis - Xarelto  Hemoglobin stable at 8.2 Awaiting CIR approval, most likely will be Monday  Theresa Duty, PA-C Orthopedic Surgery [phone] 07/01/2019, 7:41 AM

## 2019-07-01 NOTE — Progress Notes (Signed)
Physical Therapy Treatment Patient Details Name: Brittany Russo MRN: [id number] DOB: 1945-11-06 Today's Date: 07/01/2019    History of Present Illness Patient is 74 y.o. female s/p Rt AKA (knee disarticulation) on 06/29/19. Pt has significant history for Rt TKA on 09/22/18 with subsequent fall resulting in readmission and revision on 09/27/18. Patient with multiple re-admissions for GI bleed on 10/10/18, Rt knee extensor repair on 11/22/18 and excision of arthroplasty on 01/03/19. Pt PMH also includes HTN, DM, obestity, HLD, depression, anxiety, OA, asthma.    PT Comments     POD # 2 Assisted with OOB to recliner. General bed mobility comments: side to side rolling to change bed pad and increased assist to sit up onto EOB.  Increased time to self correct to midline and still needed Min Assist to prevent posterior LOB.  R LE pain increased in seated position.  Pt did static sit EOB x 5 min while brushing hair as we waited for + 2 assist. Lateral/Scoot Transfers: Max assist  Positioned to comfort.   Follow Up Recommendations  CIR     Equipment Recommendations  None recommended by PT    Recommendations for Other Services       Precautions / Restrictions Precautions Precautions: Fall Precaution Comments: R AKA, like to wear her L AFO/shoe Restrictions Weight Bearing Restrictions: Yes RLE Weight Bearing: Non weight bearing    Mobility  Bed Mobility Overal bed mobility: Needs Assistance Bed Mobility: Rolling;Supine to Sit Rolling: Mod assist   Supine to sit: Mod assist;Max assist     General bed mobility comments: side to side rolling to change bed pad and increased assist to sit up onto EOB.  Increased time to self correct to midline and still needed Min Assist to prevent posterior LOB.  R LE pain increased in seated position.  Pt did static sit EOB x 5 min while brushing hair as we waited for + 2 assist.  Transfers Overall transfer level: Needs assistance   Transfers: Lateral/Scoot  Transfers          Lateral/Scoot Transfers: Max assist General transfer comment: Assisted with Sliding Board transfer from elevated bed to drop arm recliner + 2 assist for safety/completion.  Increased pain noted during activity.   + 2 assist to adjust upright and position to comfort.  Ambulation/Gait             General Gait Details: non amb at this time   Marine scientist Rankin (Stroke Patients Only)       Balance                                            Cognition Arousal/Alertness: Awake/alert Behavior During Therapy: WFL for tasks assessed/performed Overall Cognitive Status: Within Functional Limits for tasks assessed                                 General Comments: slightly impaired cognition/safety judgement (pain meds) needed help dialing phone      Exercises      General Comments        Pertinent Vitals/Pain Pain Assessment: Faces Faces Pain Scale: Hurts even more Pain Location: Rt residual limb "my toes hurt" Phantom pain Pain Descriptors / Indicators: Aching;Burning;Grimacing  Pain Intervention(s): Monitored during session;Premedicated before session;Repositioned    Home Living                      Prior Function            PT Goals (current goals can now be found in the care plan section) Progress towards PT goals: Progressing toward goals    Frequency    Min 5X/week      PT Plan Current plan remains appropriate    Co-evaluation              AM-PAC PT "6 Clicks" Mobility   Outcome Measure  Help needed turning from your back to your side while in a flat bed without using bedrails?: A Lot Help needed moving from lying on your back to sitting on the side of a flat bed without using bedrails?: A Lot Help needed moving to and from a bed to a chair (including a wheelchair)?: Total Help needed standing up from a chair using your arms (e.g.,  wheelchair or bedside chair)?: Total Help needed to walk in hospital room?: Total Help needed climbing 3-5 steps with a railing? : Total 6 Click Score: 8    End of Session Equipment Utilized During Treatment: Gait belt Activity Tolerance: Patient limited by fatigue;Patient limited by pain Patient left: in chair;with call bell/phone within reach;with chair alarm set Nurse Communication: Mobility status;Need for lift equipment(pt is a Maxi Lift back to bed (must cross leg straps)) PT Visit Diagnosis: Muscle weakness (generalized) (M62.81);Difficulty in walking, not elsewhere classified (R26.2);Unsteadiness on feet (R26.81);Pain Pain - Right/Left: Right     Time: [id number] PT Time Calculation (min) (ACUTE ONLY): 34 min  Charges:  $Therapeutic Activity: 23-37 mins                     Rica Koyanagi  PTA Acute  Rehabilitation Services Pager      [phone] Office      [phone]

## 2019-07-01 NOTE — Plan of Care (Signed)
  Problem: Clinical Measurements: Goal: Ability to maintain clinical measurements within normal limits will improve Outcome: Not Progressing - Blood sugar not maintaining safe level Goal: Will remain free from infection Outcome: Progressing Goal: Respiratory complications will improve Outcome: Progressing   Problem: Elimination: Goal: Will not experience complications related to bowel motility Outcome: Not Progressing - no BM since 4/6   Problem: Pain Managment: Goal: General experience of comfort will improve Outcome: Progressing   Problem: Education: Goal: Ability to verbalize activity precautions or restrictions will improve Outcome: Progressing

## 2019-07-02 LAB — GLUCOSE, CAPILLARY
Glucose-Capillary: 76 mg/dL (ref 70–99)
Glucose-Capillary: 113 mg/dL — ABNORMAL HIGH (ref 70–99)
Glucose-Capillary: 118 mg/dL — ABNORMAL HIGH (ref 70–99)
Glucose-Capillary: 140 mg/dL — ABNORMAL HIGH (ref 70–99)

## 2019-07-02 NOTE — Progress Notes (Signed)
Subjective: 3 Days Post-Op Procedure(s) (LRB): AMPUTATION ABOVE KNEE through knee (Right) LEFT KNEE INJECTION (Left) Patient reports pain as mild.  No c/o.  Objective: Vital signs in last 24 hours: Temp:  [98.1 F (36.7 C)-98.6 F (37 C)] 98.3 F (36.8 C) (04/11 0457) Pulse Rate:  [74-80] 74 (04/11 0457) Resp:  [14-18] 16 (04/11 0457) BP: (127-136)/(51-60) 136/51 (04/11 0457) SpO2:  [95 %-97 %] 95 % (04/11 0457)  Intake/Output from previous day: 04/10 0701 - 04/11 0700 In: 580 [P.O.:580] Out: 900 [Urine:900] Intake/Output this shift: Total I/O In: 720 [P.O.:720] Out: 200 [Urine:200]  Recent Labs    06/30/19 0345 07/01/19 0259  HGB 8.0* 8.2*   Recent Labs    06/30/19 0345 07/01/19 0259  WBC 4.8 6.8  RBC 2.78* 2.71*  HCT 26.2* 25.6*  PLT 43* 53*   Recent Labs    06/30/19 0345 07/01/19 0259  NA 134* 132*  K 3.8 4.2  CL 99 97*  CO2 25 25  BUN 15 23  CREATININE 0.81 0.90  GLUCOSE 225* 152*  CALCIUM 8.2* 8.4*   No results for input(s): LABPT, INR in the last 72 hours.  Neurologically intact ABD soft Neurovascular intact Sensation intact distally Intact pulses distally Dorsiflexion/Plantar flexion intact Incision: dressing C/D/I and no drainage No cellulitis present Compartment soft   Assessment/Plan: 3 Days Post-Op Procedure(s) (LRB): AMPUTATION ABOVE KNEE through knee (Right) LEFT KNEE INJECTION (Left) Advance diet Up with therapy Awaiting CIR- anticipate tomorrow   Cecilie Kicks 07/02/2019, 9:48 AM

## 2019-07-02 NOTE — Plan of Care (Signed)
  Problem: Activity: Goal: Risk for activity intolerance will decrease Outcome: Progressing   Problem: Elimination: Goal: Will not experience complications related to bowel motility Outcome: Progressing   Problem: Pain Managment: Goal: General experience of comfort will improve Outcome: Progressing   Problem: Education: Goal: Knowledge of the prescribed therapeutic regimen will improve Outcome: Progressing   Problem: Education: Goal: Knowledge of the prescribed therapeutic regimen will improve Outcome: Progressing

## 2019-07-02 NOTE — Progress Notes (Signed)
Family concerned about pt blood in her urine. Family states this is not new but wanted nursing staff and md to be aware and monitor.

## 2019-07-03 LAB — GLUCOSE, CAPILLARY
Glucose-Capillary: 96 mg/dL (ref 70–99)
Glucose-Capillary: 142 mg/dL — ABNORMAL HIGH (ref 70–99)
Glucose-Capillary: 189 mg/dL — ABNORMAL HIGH (ref 70–99)
Glucose-Capillary: 144 mg/dL — ABNORMAL HIGH (ref 70–99)

## 2019-07-03 LAB — CBC
HCT: 24.9 % — ABNORMAL LOW (ref 36.0–46.0)
Hemoglobin: 8 g/dL — ABNORMAL LOW (ref 12.0–15.0)
MCH: 30.9 pg (ref 26.0–34.0)
MCHC: 32.1 g/dL (ref 30.0–36.0)
MCV: 96.1 fL (ref 80.0–100.0)
Platelets: 51 10*3/uL — ABNORMAL LOW (ref 150–400)
RBC: 2.59 MIL/uL — ABNORMAL LOW (ref 3.87–5.11)
RDW: 18.2 % — ABNORMAL HIGH (ref 11.5–15.5)
WBC: 3.5 10*3/uL — ABNORMAL LOW (ref 4.0–10.5)
nRBC: 0.6 % — ABNORMAL HIGH (ref 0.0–0.2)

## 2019-07-03 LAB — AMMONIA: Ammonia: 28 umol/L (ref 9–35)

## 2019-07-03 LAB — SURGICAL PATHOLOGY

## 2019-07-03 MED ORDER — METHOCARBAMOL 500 MG PO TABS: 500.0000 mg | ORAL_TABLET | Freq: Four times a day (QID) | ORAL | 0 refills | Status: DC | PRN

## 2019-07-03 MED ORDER — DOCUSATE SODIUM 100 MG PO CAPS: 100.0000 mg | ORAL_CAPSULE | Freq: Two times a day (BID) | ORAL | 0 refills | Status: DC

## 2019-07-03 MED ORDER — RIVAROXABAN 10 MG PO TABS: 10.0000 mg | ORAL_TABLET | Freq: Every day | ORAL | 0 refills | Status: DC

## 2019-07-03 MED ORDER — ACETAMINOPHEN 500 MG PO TABS: 500.0000 mg | ORAL_TABLET | Freq: Three times a day (TID) | ORAL | 0 refills | Status: DC | PRN

## 2019-07-03 MED ORDER — FERROUS SULFATE 325 (65 FE) MG PO TABS: 325.0000 mg | ORAL_TABLET | Freq: Three times a day (TID) | ORAL | 0 refills | Status: DC

## 2019-07-03 MED ORDER — TRAMADOL HCL 50 MG PO TABS: 50.0000 mg | ORAL_TABLET | Freq: Four times a day (QID) | ORAL | 0 refills | Status: DC | PRN

## 2019-07-03 MED ORDER — POLYETHYLENE GLYCOL 3350 17 G PO PACK: 17.0000 g | PACK | Freq: Two times a day (BID) | ORAL | 0 refills | Status: DC

## 2019-07-03 NOTE — Progress Notes (Signed)
Inpatient Rehab Admissions Coordinator:   I do not have a bed available for this patient on CIR today.  Will continue to follow for possible admission pending bed availability.   Shann Medal, PT, DPT Admissions Coordinator [phone] 07/03/19  10:52 AM

## 2019-07-03 NOTE — Progress Notes (Addendum)
     Subjective: 4 Days Post-Op Procedure(s) (LRB): AMPUTATION ABOVE KNEE through knee (Right) LEFT KNEE INJECTION (Left)   Patient reports pain as mild, pain controlled. No reported events throughout the night.  Ammonia levels obtained which reveal normal levels, previous confusion possibly due to increase ammonia levels.  CBC reveals stable labs.  Discussed allowing her to have wound dressing changes at CIR due to plan for stump shrinking dressings.  Discussed that if stable we would attempt to get her discharged to [facility] today or tomorrow,. She is a little nervous about what they are going to be doing with her, but also wanting to progress.     Objective:   VITALS:   Vitals:   07/02/19 2109 07/03/19 0612  BP: (!) 139/55 (!) 130/54  Pulse: 72 72  Resp: 16 15  Temp: 98.7 F (37.1 C) 97.9 F (36.6 C)  SpO2: 94% 95%    Incision: scant drainage No cellulitis present Compartment soft  LABS Recent Labs    07/01/19 0259 07/03/19 0747  HGB 8.2* 8.0*  HCT 25.6* 24.9*  WBC 6.8 3.5*  PLT 53* 51*    Recent Labs    07/01/19 0259  NA 132*  K 4.2  BUN 23  CREATININE 0.90  GLUCOSE 152*     Assessment/Plan: 4 Days Post-Op Procedure(s) (LRB): AMPUTATION ABOVE KNEE through knee (Right) LEFT KNEE INJECTION (Left)  Up with therapy Discharge inpatient rehab Follow up in 2 weeks at [facility] Follow up with OLIN,Johnchristopher Sarvis D in 2 weeks.  Contact information:  [facility] [address], [address] [place] [phone]         Danae Orleans PA-C  [facility]  [place] [address], [place], [place], [place] [address] Phone: [phone] [email or web address] Facebook  [employer]

## 2019-07-03 NOTE — Care Management Important Message (Signed)
Important Message  Patient Details IM Letter given to Kathrin Greathouse SW Case Manager to present to the Patient Name: Brittany Russo MRN: [id number] Date of Birth: [birth year]   Medicare Important Message Given:  Yes     Kerin Salen 07/03/2019, 2:19 PM

## 2019-07-04 ENCOUNTER — Inpatient Hospital Stay (HOSPITAL_COMMUNITY)
Admission: RE | Admit: 2019-07-04 | Discharge: 2019-07-28 | DRG: 559 | Disposition: A | Discharge status: Home Health | Payer: Medicare Other | Source: Intra-hospital | Attending: Physical Medicine & Rehabilitation | Admitting: Physical Medicine & Rehabilitation

## 2019-07-04 ENCOUNTER — Encounter (HOSPITAL_COMMUNITY): Payer: Self-pay | Admitting: Physical Medicine & Rehabilitation

## 2019-07-04 ENCOUNTER — Other Ambulatory Visit: Payer: Self-pay

## 2019-07-04 DIAGNOSIS — E7220 Disorder of urea cycle metabolism, unspecified: Secondary | ICD-10-CM | POA: Diagnosis present

## 2019-07-04 DIAGNOSIS — K627 Radiation proctitis: Secondary | ICD-10-CM

## 2019-07-04 DIAGNOSIS — K7460 Unspecified cirrhosis of liver: Secondary | ICD-10-CM | POA: Diagnosis present

## 2019-07-04 DIAGNOSIS — R5383 Other fatigue: Secondary | ICD-10-CM | POA: Diagnosis not present

## 2019-07-04 DIAGNOSIS — F320 Major depressive disorder, single episode, mild: Secondary | ICD-10-CM | POA: Diagnosis not present

## 2019-07-04 DIAGNOSIS — S78119A Complete traumatic amputation at level between unspecified hip and knee, initial encounter: Secondary | ICD-10-CM | POA: Diagnosis not present

## 2019-07-04 DIAGNOSIS — J45909 Unspecified asthma, uncomplicated: Secondary | ICD-10-CM | POA: Diagnosis present

## 2019-07-04 DIAGNOSIS — E1165 Type 2 diabetes mellitus with hyperglycemia: Secondary | ICD-10-CM

## 2019-07-04 DIAGNOSIS — F329 Major depressive disorder, single episode, unspecified: Secondary | ICD-10-CM | POA: Diagnosis present

## 2019-07-04 DIAGNOSIS — Z7901 Long term (current) use of anticoagulants: Secondary | ICD-10-CM

## 2019-07-04 DIAGNOSIS — N3040 Irradiation cystitis without hematuria: Secondary | ICD-10-CM

## 2019-07-04 DIAGNOSIS — Z87440 Personal history of urinary (tract) infections: Secondary | ICD-10-CM | POA: Diagnosis not present

## 2019-07-04 DIAGNOSIS — R0989 Other specified symptoms and signs involving the circulatory and respiratory systems: Secondary | ICD-10-CM | POA: Diagnosis not present

## 2019-07-04 DIAGNOSIS — D689 Coagulation defect, unspecified: Secondary | ICD-10-CM | POA: Diagnosis present

## 2019-07-04 DIAGNOSIS — B9689 Other specified bacterial agents as the cause of diseases classified elsewhere: Secondary | ICD-10-CM | POA: Diagnosis present

## 2019-07-04 DIAGNOSIS — F411 Generalized anxiety disorder: Secondary | ICD-10-CM | POA: Diagnosis not present

## 2019-07-04 DIAGNOSIS — Z79899 Other long term (current) drug therapy: Secondary | ICD-10-CM

## 2019-07-04 DIAGNOSIS — E119 Type 2 diabetes mellitus without complications: Secondary | ICD-10-CM | POA: Diagnosis not present

## 2019-07-04 DIAGNOSIS — R918 Other nonspecific abnormal finding of lung field: Secondary | ICD-10-CM

## 2019-07-04 DIAGNOSIS — J918 Pleural effusion in other conditions classified elsewhere: Secondary | ICD-10-CM | POA: Diagnosis present

## 2019-07-04 DIAGNOSIS — Z8249 Family history of ischemic heart disease and other diseases of the circulatory system: Secondary | ICD-10-CM | POA: Diagnosis not present

## 2019-07-04 DIAGNOSIS — R188 Other ascites: Secondary | ICD-10-CM | POA: Diagnosis present

## 2019-07-04 DIAGNOSIS — N3041 Irradiation cystitis with hematuria: Secondary | ICD-10-CM | POA: Diagnosis present

## 2019-07-04 DIAGNOSIS — I5033 Acute on chronic diastolic (congestive) heart failure: Secondary | ICD-10-CM | POA: Diagnosis present

## 2019-07-04 DIAGNOSIS — D509 Iron deficiency anemia, unspecified: Secondary | ICD-10-CM | POA: Diagnosis present

## 2019-07-04 DIAGNOSIS — J90 Pleural effusion, not elsewhere classified: Secondary | ICD-10-CM | POA: Diagnosis not present

## 2019-07-04 DIAGNOSIS — R791 Abnormal coagulation profile: Secondary | ICD-10-CM | POA: Diagnosis not present

## 2019-07-04 DIAGNOSIS — G546 Phantom limb syndrome with pain: Secondary | ICD-10-CM

## 2019-07-04 DIAGNOSIS — Z794 Long term (current) use of insulin: Secondary | ICD-10-CM

## 2019-07-04 DIAGNOSIS — R319 Hematuria, unspecified: Secondary | ICD-10-CM

## 2019-07-04 DIAGNOSIS — D62 Acute posthemorrhagic anemia: Secondary | ICD-10-CM | POA: Diagnosis present

## 2019-07-04 DIAGNOSIS — R7309 Other abnormal glucose: Secondary | ICD-10-CM | POA: Diagnosis not present

## 2019-07-04 DIAGNOSIS — D696 Thrombocytopenia, unspecified: Secondary | ICD-10-CM

## 2019-07-04 DIAGNOSIS — G8918 Other acute postprocedural pain: Secondary | ICD-10-CM | POA: Diagnosis not present

## 2019-07-04 DIAGNOSIS — Z9221 Personal history of antineoplastic chemotherapy: Secondary | ICD-10-CM

## 2019-07-04 DIAGNOSIS — Z833 Family history of diabetes mellitus: Secondary | ICD-10-CM | POA: Diagnosis not present

## 2019-07-04 DIAGNOSIS — Z9889 Other specified postprocedural states: Secondary | ICD-10-CM

## 2019-07-04 DIAGNOSIS — E871 Hypo-osmolality and hyponatremia: Secondary | ICD-10-CM | POA: Diagnosis present

## 2019-07-04 DIAGNOSIS — E11649 Type 2 diabetes mellitus with hypoglycemia without coma: Secondary | ICD-10-CM | POA: Diagnosis not present

## 2019-07-04 DIAGNOSIS — Z4781 Encounter for orthopedic aftercare following surgical amputation: Principal | ICD-10-CM

## 2019-07-04 DIAGNOSIS — N390 Urinary tract infection, site not specified: Secondary | ICD-10-CM

## 2019-07-04 DIAGNOSIS — R338 Other retention of urine: Secondary | ICD-10-CM | POA: Diagnosis present

## 2019-07-04 DIAGNOSIS — R339 Retention of urine, unspecified: Secondary | ICD-10-CM | POA: Diagnosis not present

## 2019-07-04 DIAGNOSIS — Z8542 Personal history of malignant neoplasm of other parts of uterus: Secondary | ICD-10-CM

## 2019-07-04 DIAGNOSIS — K7210 Chronic hepatic failure without coma: Secondary | ICD-10-CM | POA: Diagnosis present

## 2019-07-04 DIAGNOSIS — F419 Anxiety disorder, unspecified: Secondary | ICD-10-CM | POA: Diagnosis present

## 2019-07-04 DIAGNOSIS — K7581 Nonalcoholic steatohepatitis (NASH): Secondary | ICD-10-CM | POA: Diagnosis present

## 2019-07-04 DIAGNOSIS — D649 Anemia, unspecified: Secondary | ICD-10-CM | POA: Diagnosis not present

## 2019-07-04 DIAGNOSIS — E7849 Other hyperlipidemia: Secondary | ICD-10-CM | POA: Diagnosis not present

## 2019-07-04 DIAGNOSIS — Y842 Radiological procedure and radiotherapy as the cause of abnormal reaction of the patient, or of later complication, without mention of misadventure at the time of the procedure: Secondary | ICD-10-CM | POA: Diagnosis present

## 2019-07-04 DIAGNOSIS — N3289 Other specified disorders of bladder: Secondary | ICD-10-CM | POA: Diagnosis not present

## 2019-07-04 DIAGNOSIS — Z89611 Acquired absence of right leg above knee: Secondary | ICD-10-CM | POA: Diagnosis not present

## 2019-07-04 DIAGNOSIS — I10 Essential (primary) hypertension: Secondary | ICD-10-CM | POA: Diagnosis not present

## 2019-07-04 DIAGNOSIS — Z90710 Acquired absence of both cervix and uterus: Secondary | ICD-10-CM

## 2019-07-04 DIAGNOSIS — R310 Gross hematuria: Secondary | ICD-10-CM | POA: Diagnosis not present

## 2019-07-04 LAB — GLUCOSE, CAPILLARY
Glucose-Capillary: 104 mg/dL — ABNORMAL HIGH (ref 70–99)
Glucose-Capillary: 143 mg/dL — ABNORMAL HIGH (ref 70–99)
Glucose-Capillary: 238 mg/dL — ABNORMAL HIGH (ref 70–99)

## 2019-07-04 MED ORDER — POLYETHYLENE GLYCOL 3350 17 G PO PACK
17.0000 g | PACK | Freq: Every day | ORAL | Status: DC | PRN
  Administered 2019-07-14: 17 g via ORAL
  Filled 2019-07-04 (×2): qty 1

## 2019-07-04 MED ORDER — ATENOLOL 50 MG PO TABS
50.0000 mg | ORAL_TABLET | Freq: Every day | ORAL | Status: DC
  Administered 2019-07-05 – 2019-07-28 (×24): 50 mg via ORAL
  Filled 2019-07-04 (×25): qty 1

## 2019-07-04 MED ORDER — LORATADINE 10 MG PO TABS
10.0000 mg | ORAL_TABLET | Freq: Every day | ORAL | Status: DC
  Administered 2019-07-05 – 2019-07-28 (×24): 10 mg via ORAL
  Filled 2019-07-04 (×24): qty 1

## 2019-07-04 MED ORDER — RIVAROXABAN 10 MG PO TABS
10.0000 mg | ORAL_TABLET | ORAL | Status: AC
  Administered 2019-07-05 – 2019-07-18 (×14): 10 mg via ORAL
  Filled 2019-07-04 (×14): qty 1

## 2019-07-04 MED ORDER — FLUTICASONE PROPIONATE 50 MCG/ACT NA SUSP
1.0000 | Freq: Every day | NASAL | Status: DC
  Administered 2019-07-05 – 2019-07-28 (×24): 1 via NASAL
  Filled 2019-07-04: qty 16

## 2019-07-04 MED ORDER — ACETAMINOPHEN 325 MG PO TABS
325.0000 mg | ORAL_TABLET | ORAL | Status: DC | PRN
  Administered 2019-07-04 – 2019-07-07 (×2): 650 mg via ORAL
  Filled 2019-07-04 (×2): qty 2

## 2019-07-04 MED ORDER — METHOCARBAMOL 500 MG PO TABS
500.0000 mg | ORAL_TABLET | Freq: Four times a day (QID) | ORAL | Status: DC | PRN
  Administered 2019-07-04 – 2019-07-21 (×4): 500 mg via ORAL
  Filled 2019-07-04 (×4): qty 1

## 2019-07-04 MED ORDER — GLIPIZIDE 5 MG PO TABS
5.0000 mg | ORAL_TABLET | Freq: Two times a day (BID) | ORAL | Status: DC
  Administered 2019-07-04 – 2019-07-10 (×11): 5 mg via ORAL
  Filled 2019-07-04 (×13): qty 1

## 2019-07-04 MED ORDER — TRAZODONE HCL 50 MG PO TABS
25.0000 mg | ORAL_TABLET | Freq: Every evening | ORAL | Status: DC | PRN
  Administered 2019-07-05: 50 mg via ORAL
  Administered 2019-07-15: 25 mg via ORAL
  Administered 2019-07-16 – 2019-07-27 (×7): 50 mg via ORAL
  Filled 2019-07-04 (×9): qty 1

## 2019-07-04 MED ORDER — MAGNESIUM CITRATE PO SOLN
1.0000 | Freq: Once | ORAL | Status: DC | PRN
  Filled 2019-07-04 (×2): qty 296

## 2019-07-04 MED ORDER — GABAPENTIN 300 MG PO CAPS: 300.0000 mg | ORAL_CAPSULE | Freq: Every day | ORAL | Status: DC | PRN

## 2019-07-04 MED ORDER — ATORVASTATIN CALCIUM 80 MG PO TABS
80.0000 mg | ORAL_TABLET | Freq: Every day | ORAL | Status: DC
  Administered 2019-07-04 – 2019-07-27 (×24): 80 mg via ORAL
  Filled 2019-07-04 (×24): qty 1

## 2019-07-04 MED ORDER — ALPRAZOLAM 0.25 MG PO TABS
0.2500 mg | ORAL_TABLET | Freq: Two times a day (BID) | ORAL | Status: DC
  Administered 2019-07-04 – 2019-07-28 (×48): 0.25 mg via ORAL
  Filled 2019-07-04 (×48): qty 1

## 2019-07-04 MED ORDER — HYDROCODONE-ACETAMINOPHEN 7.5-325 MG PO TABS
1.0000 | ORAL_TABLET | ORAL | Status: DC | PRN
  Administered 2019-07-04 – 2019-07-06 (×3): 2 via ORAL
  Administered 2019-07-06: 1 via ORAL
  Administered 2019-07-07 (×3): 2 via ORAL
  Administered 2019-07-08: 1 via ORAL
  Administered 2019-07-08: 2 via ORAL
  Administered 2019-07-09 (×2): 1 via ORAL
  Administered 2019-07-11 – 2019-07-17 (×7): 2 via ORAL
  Filled 2019-07-04 (×5): qty 2
  Filled 2019-07-04: qty 1
  Filled 2019-07-04 (×4): qty 2
  Filled 2019-07-04 (×2): qty 1
  Filled 2019-07-04: qty 2
  Filled 2019-07-04: qty 1
  Filled 2019-07-04 (×5): qty 2

## 2019-07-04 MED ORDER — INSULIN ASPART 100 UNIT/ML ~~LOC~~ SOLN
0.0000 [IU] | Freq: Three times a day (TID) | SUBCUTANEOUS | Status: DC
  Administered 2019-07-05 (×2): 3 [IU] via SUBCUTANEOUS
  Administered 2019-07-06 – 2019-07-09 (×3): 2 [IU] via SUBCUTANEOUS
  Administered 2019-07-09: 3 [IU] via SUBCUTANEOUS
  Administered 2019-07-15 – 2019-07-16 (×2): 2 [IU] via SUBCUTANEOUS
  Administered 2019-07-17: 3 [IU] via SUBCUTANEOUS
  Administered 2019-07-17: 2 [IU] via SUBCUTANEOUS
  Administered 2019-07-18: 3 [IU] via SUBCUTANEOUS
  Administered 2019-07-18: 2 [IU] via SUBCUTANEOUS
  Administered 2019-07-18: 3 [IU] via SUBCUTANEOUS
  Administered 2019-07-19: 2 [IU] via SUBCUTANEOUS
  Administered 2019-07-19: 3 [IU] via SUBCUTANEOUS
  Administered 2019-07-19: 5 [IU] via SUBCUTANEOUS
  Administered 2019-07-20 (×3): 3 [IU] via SUBCUTANEOUS
  Administered 2019-07-21: 2 [IU] via SUBCUTANEOUS
  Administered 2019-07-21 – 2019-07-22 (×3): 3 [IU] via SUBCUTANEOUS
  Administered 2019-07-22: 5 [IU] via SUBCUTANEOUS
  Administered 2019-07-22 – 2019-07-23 (×2): 3 [IU] via SUBCUTANEOUS
  Administered 2019-07-23: 5 [IU] via SUBCUTANEOUS
  Administered 2019-07-23: 3 [IU] via SUBCUTANEOUS
  Administered 2019-07-24: 13:00:00 5 [IU] via SUBCUTANEOUS
  Administered 2019-07-24 – 2019-07-26 (×6): 3 [IU] via SUBCUTANEOUS
  Administered 2019-07-26: 2 [IU] via SUBCUTANEOUS
  Administered 2019-07-26 – 2019-07-27 (×2): 3 [IU] via SUBCUTANEOUS
  Administered 2019-07-27: 8 [IU] via SUBCUTANEOUS
  Administered 2019-07-28: 3 [IU] via SUBCUTANEOUS

## 2019-07-04 MED ORDER — RIFAXIMIN 550 MG PO TABS
550.0000 mg | ORAL_TABLET | Freq: Two times a day (BID) | ORAL | Status: DC
  Administered 2019-07-04 – 2019-07-28 (×48): 550 mg via ORAL
  Filled 2019-07-04 (×48): qty 1

## 2019-07-04 MED ORDER — INSULIN GLARGINE 100 UNIT/ML ~~LOC~~ SOLN
20.0000 [IU] | Freq: Every day | SUBCUTANEOUS | Status: DC
  Administered 2019-07-05 – 2019-07-08 (×4): 20 [IU] via SUBCUTANEOUS
  Filled 2019-07-04 (×5): qty 0.2

## 2019-07-04 MED ORDER — ESCITALOPRAM OXALATE 10 MG PO TABS
20.0000 mg | ORAL_TABLET | Freq: Every day | ORAL | Status: DC
  Administered 2019-07-05 – 2019-07-28 (×24): 20 mg via ORAL
  Filled 2019-07-04 (×24): qty 2

## 2019-07-04 MED ORDER — LIP MEDEX EX OINT
TOPICAL_OINTMENT | CUTANEOUS | Status: DC | PRN
  Filled 2019-07-04: qty 7

## 2019-07-04 MED ORDER — ALUM & MAG HYDROXIDE-SIMETH 200-200-20 MG/5ML PO SUSP
30.0000 mL | ORAL | Status: DC | PRN
  Administered 2019-07-15: 30 mL via ORAL
  Filled 2019-07-04: qty 30

## 2019-07-04 MED ORDER — TRAMADOL HCL 50 MG PO TABS
50.0000 mg | ORAL_TABLET | Freq: Four times a day (QID) | ORAL | Status: DC
  Administered 2019-07-04 – 2019-07-18 (×55): 50 mg via ORAL
  Filled 2019-07-04 (×56): qty 1

## 2019-07-04 MED ORDER — LINAGLIPTIN 5 MG PO TABS
5.0000 mg | ORAL_TABLET | Freq: Every day | ORAL | Status: DC
  Administered 2019-07-05 – 2019-07-14 (×10): 5 mg via ORAL
  Filled 2019-07-04 (×10): qty 1

## 2019-07-04 MED ORDER — BISACODYL 10 MG RE SUPP
10.0000 mg | Freq: Every day | RECTAL | Status: DC | PRN
  Administered 2019-07-23: 10 mg via RECTAL
  Filled 2019-07-04: qty 1

## 2019-07-04 MED ORDER — FERROUS SULFATE 325 (65 FE) MG PO TABS
325.0000 mg | ORAL_TABLET | Freq: Two times a day (BID) | ORAL | Status: DC
  Administered 2019-07-04 – 2019-07-28 (×48): 325 mg via ORAL
  Filled 2019-07-04 (×48): qty 1

## 2019-07-04 MED ORDER — METFORMIN HCL 500 MG PO TABS
500.0000 mg | ORAL_TABLET | Freq: Two times a day (BID) | ORAL | Status: DC
  Administered 2019-07-04 – 2019-07-28 (×47): 500 mg via ORAL
  Filled 2019-07-04 (×47): qty 1

## 2019-07-04 MED ORDER — BUPROPION HCL ER (XL) 150 MG PO TB24
150.0000 mg | ORAL_TABLET | Freq: Every day | ORAL | Status: DC
  Administered 2019-07-05 – 2019-07-28 (×24): 150 mg via ORAL
  Filled 2019-07-04 (×24): qty 1

## 2019-07-04 MED ORDER — ALBUTEROL SULFATE (2.5 MG/3ML) 0.083% IN NEBU: 3.0000 mL | INHALATION_SOLUTION | Freq: Four times a day (QID) | RESPIRATORY_TRACT | Status: DC | PRN

## 2019-07-04 MED ORDER — GUAIFENESIN-DM 100-10 MG/5ML PO SYRP: 5.0000 mL | ORAL_SOLUTION | Freq: Four times a day (QID) | ORAL | Status: DC | PRN

## 2019-07-04 MED ORDER — DOCUSATE SODIUM 100 MG PO CAPS
100.0000 mg | ORAL_CAPSULE | Freq: Two times a day (BID) | ORAL | Status: DC
  Administered 2019-07-04 – 2019-07-21 (×29): 100 mg via ORAL
  Filled 2019-07-04 (×31): qty 1

## 2019-07-04 MED ORDER — POLYETHYLENE GLYCOL 3350 17 G PO PACK
17.0000 g | PACK | Freq: Two times a day (BID) | ORAL | Status: DC
  Administered 2019-07-05 – 2019-07-08 (×2): 17 g via ORAL
  Filled 2019-07-04 (×8): qty 1

## 2019-07-04 MED ORDER — DIPHENHYDRAMINE HCL 12.5 MG/5ML PO ELIX: 12.5000 mg | ORAL_SOLUTION | Freq: Four times a day (QID) | ORAL | Status: DC | PRN

## 2019-07-04 MED ORDER — INSULIN ASPART 100 UNIT/ML ~~LOC~~ SOLN
0.0000 [IU] | Freq: Every day | SUBCUTANEOUS | Status: DC
  Administered 2019-07-04 – 2019-07-27 (×2): 2 [IU] via SUBCUTANEOUS

## 2019-07-04 MED ORDER — FLEET ENEMA 7-19 GM/118ML RE ENEM: 1.0000 | ENEMA | Freq: Once | RECTAL | Status: DC | PRN

## 2019-07-04 MED ORDER — PROCHLORPERAZINE 25 MG RE SUPP: 12.5000 mg | Freq: Four times a day (QID) | RECTAL | Status: DC | PRN

## 2019-07-04 MED ORDER — PROCHLORPERAZINE MALEATE 5 MG PO TABS
5.0000 mg | ORAL_TABLET | Freq: Four times a day (QID) | ORAL | Status: DC | PRN
  Administered 2019-07-18: 10 mg via ORAL
  Filled 2019-07-04: qty 2

## 2019-07-04 MED ORDER — PROCHLORPERAZINE EDISYLATE 10 MG/2ML IJ SOLN: 5.0000 mg | Freq: Four times a day (QID) | INTRAMUSCULAR | Status: DC | PRN

## 2019-07-04 NOTE — Progress Notes (Addendum)
Inpatient Rehab Admissions Coordinator:   I have a bed available for this patient to admit to CIR today.  Awaiting for final confirmation from Dr. Alvan Dame.  Will let pt/family and TOC team know.   Addendum: I have approval from [facility], PA-C, for pt to admit to CIR today.  Shann Medal, PT, DPT Admissions Coordinator [phone] 07/04/19  10:00 AM

## 2019-07-04 NOTE — IPOC Note (Signed)
Overall Plan of Care [facility]) Patient Details Name: Brittany Russo MRN: [id number] DOB: [birth year]  Admitting Diagnosis: Amputation above knee [facility])  Hospital Problems: Principal Problem:   Amputation above knee ([place]) Active Problems:   DM2 (diabetes mellitus, type 2) ([place])   Cirrhosis ([place])   Hematuria   Controlled type 2 diabetes mellitus with hyperglycemia, with long-term current use of insulin (HCC)   Hyponatremia   Thrombocytopenia (HCC)   Phantom limb pain (HCC)   Postoperative pain     Functional Problem List: Nursing Endurance, Medication Management, Motor, Pain, Safety, Sensory, Skin Integrity  PT Behavior, Endurance, Balance, Pain, Safety, Sensory, Motor, Skin Integrity  OT Balance, Endurance, Motor, Pain, Safety  SLP    TR         Basic ADL's: OT Grooming, Bathing, Dressing, Toileting     Advanced  ADL's: OT       Transfers: PT Bed Mobility, Bed to Chair, Car, [employer], Futures trader, Metallurgist: PT Wheelchair Mobility     Additional Impairments: OT None  SLP        TR      Anticipated Outcomes Item Anticipated Outcome  Self Feeding    Swallowing      Basic self-care  Min A  Toileting  Min A   Bathroom Transfers Min A  Bowel/Bladder  Mod I assist  Transfers  CGA  Locomotion  mod/i household w/c mobility  Communication     Cognition     Pain  < 4  Safety/Judgment  Mod I assist   Therapy Plan: PT Intensity: Minimum of 1-2 x/day ,45 to 90 minutes PT Frequency: 5 out of 7 days PT Duration Estimated Length of Stay: 10-14 days OT Intensity: Minimum of 1-2 x/day, 45 to 90 minutes OT Frequency: 5 out of 7 days OT Duration/Estimated Length of Stay: 14-16 days     Due to the current state of emergency, patients may not be receiving their 3-hours of Medicare-mandated therapy.   Team Interventions: Nursing Interventions Patient/Family Education, Skin Care/Wound Management, Disease Management/Prevention, Discharge  Planning, Pain Management, Medication Management  PT interventions Ambulation/gait training, Training and development officer, Community reintegration, Discharge planning, Disease management/prevention, DME/adaptive equipment instruction, Neuromuscular re-education, Functional mobility training, Pain management, Patient/family education, Skin care/wound management, Psychosocial support, Splinting/orthotics, Therapeutic Exercise, Therapeutic Activities, UE/LE Strength taining/ROM, Wheelchair propulsion/positioning  OT Interventions Training and development officer, Discharge planning, Community reintegration, Disease mangement/prevention, Engineer, drilling, Functional mobility training, Neuromuscular re-education, Pain management, Patient/family education, Psychosocial support, Self Care/advanced ADL retraining, Skin care/wound managment, Splinting/orthotics, Therapeutic Activities, Therapeutic Exercise, UE/LE Strength taining/ROM, Wheelchair propulsion/positioning  SLP Interventions    TR Interventions    SW/CM Interventions Discharge Planning, Disease Management/Prevention, Psychosocial Support, Patient/Family Education   Barriers to Discharge MD  Medical stability, Wound care, and Weight bearing restrictions  Nursing Weight bearing restrictions, Wound Care, Other (comments) pain to new amputation  PT Wound Care, Behavior fear of falling/movement  OT      SLP      SW       Team Discharge Planning: Destination: PT-Home ,OT- Home , SLP-  Projected Follow-up: PT-Home health PT, OT-  Home health OT, 24 hour supervision/assistance, SLP-  Projected Equipment Needs: PT-To be determined, OT- (drop arm BSC), SLP-  Equipment Details: PT-has w/c and slide board already, OT-  Patient/family involved in discharge planning: PT- Patient,  OT-Patient, SLP-   MD ELOS: 10-14 days. Medical Rehab Prognosis:  Good Assessment: 74 year old female with history of asthma, GI  bleed, UTIs, hematuria, T2DM,  depression/anxiety, right cirrhosis of the liver (NAFLD) with acute on chronic hepatic failure, GIBs,  failed R-TKR complicated by fall with distal  Femur fracture and infection. She has had pain with functional disability affecting QOL and elected to undergo R-AKA on 06/28/19 by Dr. Alvan Dame. Post op NWB and has been limited by fatigue, pain and hypoglycemia. ABLA and thrombocytopenia being monitored. Patient with resulting functional deficits with mobility, transfers, self-care.  Will set goals for Mod I at wheelchair level wit PT and Min A with OT.  See Team Conference Notes for weekly updates to the plan of care

## 2019-07-04 NOTE — Progress Notes (Signed)
Patient ID: Brittany Russo, female   DOB: 1945-11-16, 74 y.o.   MRN: [id number] Subjective: 5 Days Post-Op Procedure(s) (LRB): AMPUTATION ABOVE KNEE through knee (Right) LEFT KNEE INJECTION (Left)    Patient reports pain as mild.  Reported concerns for nausea yesterday.  Brittany Russo that her brother was admitted to hospital in [place] recently diagnosed with leukemia.  Led to her not eating dinner which could have resulted in some nausea. Improved this am. Awaiting bed in CIR  Objective:   VITALS:   Vitals:   07/03/19 2120 07/04/19 0509  BP: (!) 138/59 (!) 142/75  Pulse: 69 65  Resp: 17 18  Temp: 98.3 F (36.8 C) 98.7 F (37.1 C)  SpO2: 94% 96%    Incision: dressing C/D/I  LABS Recent Labs    07/03/19 0747  HGB 8.0*  HCT 24.9*  WBC 3.5*  PLT 51*    No results for input(s): NA, K, BUN, CREATININE, GLUCOSE in the last 72 hours.  No results for input(s): LABPT, INR in the last 72 hours.   Assessment/Plan: 5 Days Post-Op Procedure(s) (LRB): AMPUTATION ABOVE KNEE through knee (Right) LEFT KNEE INJECTION (Left)   Up with therapy  Plan to transfer to CIR when bed available to begin right AKA treatment, stump management and rehab RTC in 2 weeks to check wound and remove staples

## 2019-07-04 NOTE — PMR Pre-admission (Signed)
PMR Admission Coordinator Pre-Admission Assessment  Patient: Brittany Russo is an 74 y.o., female MRN: [id number] DOB: 10/07/1945 Height: 5' 5"  (165.1 cm) Weight: 71.2 kg  Insurance Information HMO:     PPO:      PCP:      IPA:      80/20:      OTHER:  PRIMARY: Medicare A and B      Policy#: [id number]      Subscriber: pt CM Name:       Phone#:      Fax#:  Pre-Cert#: verified online      Employer:  Benefits:  Phone #:      Name:  Eff. Date: A and B 02/20/13     Deduct: $1484      Out of Pocket Max:       Life Max:  CIR: 100% per Medicare guidelines      SNF: 20 full days Outpatient: 80%     Co-Pay: 20% Home Health: 100%      Co-Pay:  DME: 80%     Co-Pay: 20% Providers: pt choice  Secondary: Generic Publishing rights manager #: [id number]   Subscriber: pt CM Name:  Phone:  Fax:  Pre-Cert: Employer: Benefits: Phone #: [phone] Name: Irene Shipper Date:  Deduct: Out of Pocket Max: Life Max: CIR: SNF: Outpatient: Co-Pay: Home Health:  Co-Pay: DME:   Co-Pay: Providers:  Tertiary: Medicaid      Policy#: [id number] p      Subscriber: pt CM Name:       Phone#:      Fax#:  Pre-Cert#: verified online, coverage code [id number]      Employer:  Benefits:  Phone #: [phone]     Name: n/a Eff. Date: active as of 07/04/2019     Deduct:       Out of Pocket Max:       Life Max:  CIR:       SNF:  Outpatient:      Co-Pay:  Home Health:       Co-Pay:  DME:      Co-Pay:   Medicaid Application Date:       Case Manager:  Disability Application Date:       Case Worker:   The "Data Collection Information Summary" for patients in Inpatient Rehabilitation Facilities with attached "Privacy Act [place] Records" was provided and verbally reviewed with: Patient  Emergency Contact Information Contact Information    Name Relation Home Work Dike Daughter [phone]  [phone]   GARNETTE, GREB [phone]  [phone]   Ricketta, Colantonio Daughter [phone]  [phone]    Birder Robson Daughter [phone]  [phone]      Current Medical History  Patient Admitting Diagnosis: R AKA  History of Present Illness: Pt is a 74 y/o female with PMH of cirrhosis, ascites, depression, HTN, and DM2 admitted to [facility] on 06/29/2019 for R AKA.  Pt underwent R TKA on 09/22/2018 per Dr. Alvan Dame, and fell after leaving the hospital on 7/4, sustaining a R distal femur periprosthetic fracture.  She underwent multiple surgical and non-surgical attempts to salvage the knee, and ultimately opted for a R AKA, per Dr. Alvan Dame on 06/29/2019.  Post op course pain management.  Therapy evaluations were completed and pt was recommended for CIR.   Patient's [facility] has been reviewed by the rehabilitation admission coordinator and physician.  Past Medical History  Past Medical History:  Diagnosis  Date  . Acute and subacute hepatic failure without coma   . Allergy   . Anasarca   . Anxiety   . Arthritis   . Ascites 09/2018   no SBP on 850 cc tap 10/13/18  . Asthma    allery induced  . Cirrhosis of liver ([place]) ~ 2004   from NAFLD.  Dx ~ 2004.  Dr Dorcas Mcmurray at [facility]/    . Depression   . Diabetes mellitus without complication ([place])    type 2  . Eczema of both hands   . Endometrial cancer ([place]) 1996, 2003   hysterectomy 1996.  recurrence 2003, treated with radiation.    . Generalized edema   . GI hemorrhage   . 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 in [place]  . Hyperlipidemia   . Hypertension   . Muscle weakness   . Seizures ([place])    last seizure June 07, 2014   . Thrombocytopenia ([place])   . Thyroid nodule     Family History   family history includes Cancer in her father and maternal grandmother; Diabetes in her mother; Heart disease in her father, maternal grandfather, and mother.  Prior Rehab/Hospitalizations Has the patient had prior rehab or hospitalizations prior to  admission? Yes  Has the patient had major surgery during 100 days prior to admission? Yes   Current Medications  Current Facility-Administered Medications:  .  0.9 %  sodium chloride infusion, , Intravenous, Continuous, Norman Herrlich, Stopped at 06/30/19 5284 .  acetaminophen (TYLENOL) tablet 325-650 mg, 325-650 mg, Oral, Q6H PRN, Danae Orleans, PA-C, 650 mg at 07/02/19 1633 .  albuterol (PROVENTIL) (2.5 MG/3ML) 0.083% nebulizer solution 3 mL, 3 mL, Inhalation, Q6H PRN, Danae Orleans, PA-C .  ALPRAZolam Duanne Moron) tablet 0.25 mg, 0.25 mg, Oral, BID, Danae Orleans, PA-C, 0.25 mg at 07/04/19 0933 .  alum & mag hydroxide-simeth (MAALOX/MYLANTA) 200-200-20 MG/5ML suspension 15 mL, 15 mL, Oral, Q4H PRN, Babish, Matthew, PA-C .  atenolol (TENORMIN) tablet 50 mg, 50 mg, Oral, Daily, Danae Orleans, PA-C, 50 mg at 07/04/19 0934 .  atorvastatin (LIPITOR) tablet 80 mg, 80 mg, Oral, Q2000, Danae Orleans, PA-C, 80 mg at 07/03/19 2243 .  bisacodyl (DULCOLAX) suppository 10 mg, 10 mg, Rectal, Daily PRN, Babish, Matthew, PA-C .  buPROPion (WELLBUTRIN XL) 24 hr tablet 150 mg, 150 mg, Oral, Daily, Danae Orleans, PA-C, 150 mg at 07/04/19 0934 .  diphenhydrAMINE (BENADRYL) 12.5 MG/5ML elixir 12.5-25 mg, 12.5-25 mg, Oral, Q4H PRN, Babish, Matthew, PA-C .  docusate sodium (COLACE) capsule 100 mg, 100 mg, Oral, BID, Danae Orleans, PA-C, 100 mg at 07/01/19 0917 .  escitalopram (LEXAPRO) tablet 20 mg, 20 mg, Oral, Daily, Babish, Matthew, PA-C, 20 mg at 07/04/19 0933 .  ferrous sulfate tablet 325 mg, 325 mg, Oral, BID WC, Danae Orleans, PA-C, 325 mg at 07/04/19 0934 .  fluticasone (FLONASE) 50 MCG/ACT nasal spray 1 spray, 1 spray, Each Nare, Daily, Babish, Matthew, PA-C, 1 spray at 07/04/19 0948 .  gabapentin (NEURONTIN) capsule 300 mg, 300 mg, Oral, Daily PRN, Danae Orleans, PA-C, 300 mg at 06/30/19 0845 .  glipiZIDE (GLUCOTROL) tablet 5 mg, 5 mg, Oral, BID AC, Babish, Matthew, PA-C, 5 mg at  07/01/19 0818 .  HYDROcodone-acetaminophen (NORCO) 7.5-325 MG per tablet 1-2 tablet, 1-2 tablet, Oral, Q4H PRN, Danae Orleans, PA-C .  HYDROcodone-acetaminophen (NORCO/VICODIN) 5-325 MG per tablet 1-2 tablet, 1-2 tablet, Oral, Q4H PRN, Danae Orleans, PA-C, 1 tablet at 07/02/19 2125 .  HYDROmorphone (DILAUDID) injection 0.5-1 mg, 0.5-1 mg, Intravenous, Q2H PRN, Babish, Matthew, PA-C .  insulin aspart (novoLOG) injection 0-15 Units, 0-15 Units, Subcutaneous, TID WC, Babish, Rodman Key, PA-C, 3 Units at 07/03/19 1805 .  insulin aspart (novoLOG) injection 0-5 Units, 0-5 Units, Subcutaneous, QHS, Danae Orleans, PA-C, 2 Units at 06/30/19 2137 .  insulin glargine (LANTUS) injection 20 Units, 20 Units, Subcutaneous, Daily, Danae Orleans, PA-C, 20 Units at 07/04/19 0935 .  linagliptin (TRADJENTA) tablet 5 mg, 5 mg, Oral, Daily, Paralee Cancel, MD, 5 mg at 07/04/19 0932 .  lip balm (CARMEX) ointment, , Topical, PRN, Paralee Cancel, MD .  loratadine (CLARITIN) tablet 10 mg, 10 mg, Oral, Daily, Danae Orleans, PA-C, 10 mg at 07/04/19 0933 .  magnesium citrate solution 1 Bottle, 1 Bottle, Oral, Once PRN, Babish, Matthew, PA-C .  menthol-cetylpyridinium (CEPACOL) lozenge 3 mg, 1 lozenge, Oral, PRN **OR** phenol (CHLORASEPTIC) mouth spray 1 spray, 1 spray, Mouth/Throat, PRN, Babish, Matthew, PA-C .  metFORMIN (GLUCOPHAGE) tablet 500 mg, 500 mg, Oral, BID WC, Paralee Cancel, MD, 500 mg at 07/01/19 0818 .  methocarbamol (ROBAXIN) tablet 500 mg, 500 mg, Oral, Q6H PRN, 500 mg at 07/02/19 1633 **OR** methocarbamol (ROBAXIN) 500 mg in dextrose 5 % 50 mL IVPB, 500 mg, Intravenous, Q6H PRN, Babish, Matthew, PA-C .  metoCLOPramide (REGLAN) tablet 5-10 mg, 5-10 mg, Oral, Q8H PRN **OR** metoCLOPramide (REGLAN) injection 5-10 mg, 5-10 mg, Intravenous, Q8H PRN, Babish, Matthew, PA-C .  ondansetron (ZOFRAN) tablet 4 mg, 4 mg, Oral, Q6H PRN **OR** ondansetron (ZOFRAN) injection 4 mg, 4 mg, Intravenous, Q6H PRN, Babish, Matthew,  PA-C .  polyethylene glycol (MIRALAX / GLYCOLAX) packet 17 g, 17 g, Oral, BID, Danae Orleans, PA-C, 17 g at 07/01/19 0919 .  rifaximin (XIFAXAN) tablet 550 mg, 550 mg, Oral, BID, Danae Orleans, PA-C, 550 mg at 07/04/19 0933 .  rivaroxaban (XARELTO) tablet 10 mg, 10 mg, Oral, Q24H, Babish, Matthew, PA-C, 10 mg at 07/04/19 0947 .  traMADol (ULTRAM) tablet 50 mg, 50 mg, Oral, Q6H, Babish, Matthew, PA-C, 50 mg at 07/04/19 3785 .  traZODone (DESYREL) tablet 50 mg, 50 mg, Oral, QHS PRN, Babish, Matthew, PA-C  Patients Current Diet:  Diet Order            Diet - low sodium heart healthy        Diet Carb Modified Fluid consistency: Thin; Room service appropriate? Yes  Diet effective now              Precautions / Restrictions Precautions Precautions: Fall Precaution Comments: R AKA, like to wear her L AFO/shoe Restrictions Weight Bearing Restrictions: Yes RLE Weight Bearing: Non weight bearing   Has the patient had 2 or more falls or a fall with injury in the past year? Yes  Prior Activity Level Household: w/c level at baseline, supervision/CGA for slide board transfers; has been this level since 09/2018 following initial TKA/periprosthetic fx on the R  Prior Functional Level Self Care: Did the patient need help bathing, dressing, using the toilet or eating? Needed some help  Indoor Mobility: Did the patient need assistance with walking from room to room (with or without device)? Dependent, w/c level  Stairs: Did the patient need assistance with internal or external stairs (with or without device)? Dependent, w/c level  Functional Cognition: Did the patient need help planning regular tasks such as shopping or remembering to take medications? Needed some help  Home Assistive Devices / [place] Devices/Equipment: Wheelchair, Other (Comment), Bedside commode/3-in-1 Home Equipment: Wheelchair -  manual, Adaptive equipment, Bedside commode, Tub bench  Prior Device  Use: Indicate devices/aids used by the patient prior to current illness, exacerbation or injury? Manual wheelchair and slide board  Current Functional Level Cognition  Overall Cognitive Status: Within Functional Limits for tasks assessed Orientation Level: Oriented X4 General Comments: slightly impaired cognition/safety judgement (pain meds) needed help dialing phone    Extremity Assessment (includes Sensation/Coordination)  Upper Extremity Assessment: Generalized weakness  Lower Extremity Assessment: Generalized weakness, LLE deficits/detail LLE Deficits / Details: 2+ for hip flexion, 3/5 for knee extension    ADLs       Mobility  Overal bed mobility: Needs Assistance Bed Mobility: Rolling, Supine to Sit Rolling: Mod assist Supine to sit: Mod assist, Max assist Sit to supine: Mod assist, Max assist General bed mobility comments: side to side rolling to change bed pad and increased assist to sit up onto EOB.  Increased time to self correct to midline and still needed Min Assist to prevent posterior LOB.  R LE pain increased in seated position.  Pt did static sit EOB x 5 min while brushing hair as we waited for + 2 assist.    Transfers  Overall transfer level: Needs assistance Transfers: Lateral/Scoot Transfers  Lateral/Scoot Transfers: Max assist General transfer comment: Assisted with Sliding Board transfer from elevated bed to drop arm recliner + 2 assist for safety/completion.  Increased pain noted during activity.   + 2 assist to adjust upright and position to comfort.    Ambulation / Gait / Stairs / Wheelchair Mobility  Ambulation/Gait General Gait Details: non amb at this time    Posture / Balance Dynamic Sitting Balance Sitting balance - Comments: pt reliant on UE support  Balance Overall balance assessment: Needs assistance Sitting-balance support: Bilateral upper extremity supported, Feet supported Sitting balance-Leahy Scale: Poor Sitting balance - Comments: pt  reliant on UE support     Special needs/care consideration BiPAP/CPAP no CPM no Continuous Drip IV no Dialysis no        Days n/a Life Vest no Oxygen no Special Bed no Trach Size no Wound Vac (area) no      Location n/a Skin incision to R residual limb                             Bowel mgmt: continent Bladder mgmt: continent Diabetic mgmt: yes Behavioral consideration no Chemo/radiation no   Previous Home Environment (from acute therapy documentation) Living Arrangements: Spouse/significant other Available Help at Discharge: Family Type of Home: Apartment Home Layout: One level Home Access: Level entry, Building control surveyor Shower/Tub: Chiropodist: Handicapped height Bathroom Accessibility: Yes Home Care Services: Yes Type of Home Care Services: Other (Comment) Home Care Agency (if known): PCS worker/ family  Aging and disability services Additional Comments: pt reports she has a CNA come to assist her from 10am-1pm every day. She assists with bathing, dressing. pt reports he daughter helps as well and her husband.   Discharge Living Setting Plans for Discharge Living Setting: Patient's home Type of Home at Discharge: Apartment Discharge Home Layout: One level Discharge Home Access: Level entry, Elevator Discharge Bathroom Shower/Tub: Tub/shower unit Discharge Bathroom Toilet: Handicapped height Discharge Bathroom Accessibility: Yes How Accessible: Accessible via wheelchair Does the patient have any problems obtaining your medications?: No  Social/Family/Support Systems Patient Roles: Spouse Contact Information: Sienna Stonehocker [phone] (cell) Anticipated Caregiver: Merry Proud is primary contact (daughter) Anticipated Caregiver's Contact Information: [phone] Ability/Limitations of  Caregiver: works, pt has 24/7 at baseline between family and hired Production assistant, radio Availability: 24/7 Discharge Plan Discussed with Primary Caregiver: Yes Is  Caregiver In Agreement with Plan?: Yes Does Caregiver/Family have Issues with Lodging/Transportation while Pt is in Rehab?: No  Goals/Additional Needs Patient/Family Goal for Rehab: PT/OT supervision/CGA w/c level Expected length of stay: 18-21 days Dietary Needs: carb modified/thin Equipment Needs: the armrest on pt's w/c from home is broken Additional Information: uses double upright AFO on the left; her brother is very ill/passing away at time of admission to CIR Pt/Family Agrees to Admission and willing to participate: Yes Program Orientation Provided & Reviewed with Pt/Caregiver Including Roles  & Responsibilities: Yes  Decrease burden of Care through IP rehab admission: n/a  Possible need for SNF placement upon discharge: not anticipated. Pt with good family support, and motivated to return to prior level of function.   Patient Condition: I have reviewed medical records from [facility], spoken with CM, and patient and daughter. I met with patient at the bedside for inpatient rehabilitation assessment.  Patient will benefit from ongoing PT and OT, can actively participate in 3 hours of therapy a day 5 days of the week, and can make measurable gains during the admission.  Patient will also benefit from the coordinated team approach during an Inpatient Acute Rehabilitation admission.  The patient will receive intensive therapy as well as Rehabilitation physician, nursing, social worker, and care management interventions.  Due to safety, skin/wound care, disease management, medication administration, pain management and patient education the patient requires 24 hour a day rehabilitation nursing.  The patient is currently max +2 with mobility and basic ADLs.  Discharge setting and therapy post discharge at home with home health is anticipated.  Patient has agreed to participate in the Acute Inpatient Rehabilitation Program and will admit today.  Preadmission Screen Completed By:  Michel Santee, PT, DPT 07/04/2019 10:43 AM ______________________________________________________________________   Discussed status with Dr. Dagoberto Ligas on 07/04/19  at 11:05 AM  and received approval for admission today.  Admission Coordinator:  Michel Santee, PT, DPT time 11:06 AM Sudie Grumbling 07/04/19    Assessment/Plan: Diagnosis: 1. Does the need for close, 24 hr/day Medical supervision in concert with the patient's rehab needs make it unreasonable for this patient to be served in a less intensive setting? Yes 2. Co-Morbidities requiring supervision/potential complications: HTN, DM, depression, anxiety, asthma, L AKA, GI bleed 3. Due to bladder management, safety, skin/wound care, disease management, medication administration, pain management and patient education, does the patient require 24 hr/day rehab nursing? Yes 4. Does the patient require coordinated care of a physician, rehab nurse, PT, OT, and SLP to address physical and functional deficits in the context of the above medical diagnosis(es)? Yes Addressing deficits in the following areas: balance, endurance, locomotion, strength, transferring, bathing, dressing, feeding, grooming and toileting 5. Can the patient actively participate in an intensive therapy program of at least 3 hrs of therapy 5 days a week? Yes 6. The potential for patient to make measurable gains while on inpatient rehab is good 7. Anticipated functional outcomes upon discharge from inpatient rehab: supervision and min assist PT, supervision and min assist OT, n/a SLP 8. Estimated rehab length of stay to reach the above functional goals is: 18-21 days 9. Anticipated discharge destination: Home 10. Overall Rehab/Functional Prognosis: good   MD Signature:

## 2019-07-04 NOTE — H&P (Signed)
Physical Medicine and Rehabilitation Admission H&P    CC: Functional deficits due to AKA.   HPI: Brittany Russo is a 74 year old female with history of asthma, T2DM, depression/anxiety, right cirrhosis of the liver (NAFLD) with acute on chronic hepatic failure, GIBs,  failed R-TKR complicated by fall with distal  Femur fracture and infection. She has had pain with functional disability affecting QOL and elected to undergo R-AKA on 06/28/19 by Dr. Alvan Dame. Post op NWB and has been limited by fatigue, pain and hypoglycemia. ABLA and thrombocytopenia being monitored. Patient is being admitted to CIR.   Pt reports she gets frequent UTIs- with hematuria- latest time had hematuria, no UTI, but has had cystoscopy and barium enema to check for causes. Also hx of GI bleed 7/20.  Denies hx of stones Has been on lactulose in past, however stools already loose due to other meds, and so stopped giving it regularly.   LBM this AM- denies constipation Has phantom pain in R knee- but doing pretty well overall, with scheduled tramadol.      Review of Systems  Constitutional: Negative for chills and fever.  HENT: Negative for hearing loss.   Eyes: Negative for blurred vision and double vision.  Respiratory: Negative for cough and shortness of breath.   Cardiovascular: Negative for chest pain, palpitations and leg swelling.  Gastrointestinal: Positive for nausea. Negative for constipation and vomiting.  Genitourinary: Positive for frequency, hematuria and urgency.  Musculoskeletal: Positive for myalgias.  Neurological: Positive for sensory change, focal weakness (left foot drop) and weakness.  Psychiatric/Behavioral: The patient does not have insomnia.   All other systems reviewed and are negative.   Past Medical History:  Diagnosis Date  . Acute and subacute hepatic failure without coma   . Allergy   . Anasarca   . Anxiety   . Arthritis   . Ascites 09/2018   no SBP on 850 cc tap 10/13/18    . Asthma    allery induced  . Cirrhosis of liver ([place]) ~ 2004   from NAFLD.  Dx ~ 2004.  Dr Dorcas Mcmurray at [facility]/    . Depression   . Diabetes mellitus without complication ([place])    type 2  . Eczema of both hands   . Endometrial cancer ([place]) 1996, 2003   hysterectomy 1996.  recurrence 2003, treated with radiation.    . Generalized edema   . GI hemorrhage   . 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 in [place]  . Hyperlipidemia   . Hypertension   . Muscle weakness   . Seizures ([place])    last seizure June 07, 2014   . Thrombocytopenia ([place])   . Thyroid nodule    Past Surgical History:  Procedure Laterality Date  . AMPUTATION Right 06/29/2019   Procedure: AMPUTATION ABOVE KNEE through knee;  Surgeon: Paralee Cancel, MD;  Location: [facility];  Service: Orthopedics;  Laterality: Right;  2 hrs  . CATARACT EXTRACTION W/PHACO Left 06/04/2017   Procedure: CATARACT EXTRACTION PHACO AND INTRAOCULAR LENS PLACEMENT (IOC);  Surgeon: Baruch Goldmann, MD;  Location: [facility];  Service: Ophthalmology;  Laterality: Left;  CDE: 3.90  . CATARACT EXTRACTION W/PHACO Right 07/15/2017   Procedure: CATARACT EXTRACTION PHACO AND INTRAOCULAR LENS PLACEMENT (IOC);  Surgeon: Baruch Goldmann, MD;  Location: [facility];  Service: Ophthalmology;  Laterality: Right;  CDE: 7.60  . COLONOSCOPY  06/2007   in [place]. for hematochzia.  radiation proctitis  .  DILATION AND CURETTAGE OF UTERUS    . ESOPHAGOGASTRODUODENOSCOPY  2016  . ESOPHAGOGASTRODUODENOSCOPY (EGD) WITH PROPOFOL N/A 12/30/2018   Procedure: EGD;  Surgeon: Clarene Essex, MD;  [facility]: [facility];  Service: Endoscopy;  Laterality: N/A;  . EXCISIONAL TOTAL KNEE ARTHROPLASTY Right 01/03/2019   Procedure: EXCISIONAL TOTAL KNEE ARTHROPLASTY;  Surgeon: Paralee Cancel, MD;  [facility]: [facility];  Service: Orthopedics;  Laterality: Right;  . FLEXIBLE SIGMOIDOSCOPY N/A 12/30/2018   Procedure: FLEXIBLE SIGMOIDOSCOPY;   Surgeon: Clarene Essex, MD;  [facility];  Service: Endoscopy;  Laterality: N/A;  . IR PARACENTESIS  10/13/2018  . STERIOD INJECTION Left 06/29/2019   Procedure: LEFT KNEE INJECTION;  Surgeon: Paralee Cancel, MD;  [facility];  Service: Orthopedics;  Laterality: Left;  . TOTAL ABDOMINAL HYSTERECTOMY  1996  . TOTAL KNEE ARTHROPLASTY Right 09/22/2018   Procedure: TOTAL KNEE ARTHROPLASTY;  Surgeon: Paralee Cancel, MD;  [facility];  Service: Orthopedics;  Laterality: Right;  70 mins  . TOTAL KNEE REVISION Right 09/27/2018   Procedure: TOTAL KNEE REVISION;  Surgeon: Paralee Cancel, MD;  [facility];  Service: Orthopedics;  Laterality: Right;  . TOTAL KNEE REVISION Right 11/22/2018   Procedure: repair right knee extensor mechanism;  Surgeon: Paralee Cancel, MD;  [facility];  Service: Orthopedics;  Laterality: Right;  90 mins   Family History  Problem Relation Age of Onset  . Heart disease Mother   . Diabetes Mother   . Heart disease Father   . Cancer Father   . Cancer Maternal Grandmother   . Heart disease Maternal Grandfather     Social History:  Married. Husband and daughter assist as needed. Has CNA 10-1 pm to assist with ADLS--uses bed pan. She has been wheelchair bound for past 6-8 months--use SB for transfers. She reports that she has never smoked. She has never used smokeless tobacco. She reports that she does not drink alcohol or use drugs.    Allergies  Allergen Reactions  . Asa [Aspirin] Other (See Comments)    Pt not allergic but cannot take due to bleeding   . Other Other (See Comments)    Any jewelry metal-hives and itching    Medications Prior to Admission  Medication Sig Dispense Refill  . acetaminophen (TYLENOL) 500 MG tablet Take 1-2 tablets (500-1,000 mg total) by mouth every 8 (eight) hours as needed. 30 tablet 0  . albuterol (VENTOLIN HFA) 108 (90 Base) MCG/ACT inhaler Inhale 1-2 puffs into the lungs every 6 (six) hours as needed for wheezing or  shortness of breath.    . ALPRAZolam (XANAX) 0.25 MG tablet Take 1 tablet (0.25 mg total) by mouth 2 (two) times daily. 10 tablet 0  . atenolol (TENORMIN) 50 MG tablet Take 50 mg by mouth daily.    [name] atorvastatin (LIPITOR) 80 MG tablet Take 80 mg by mouth daily at 8 pm.    . buPROPion (WELLBUTRIN XL) 150 MG 24 hr tablet Take 150 mg by mouth daily.    [name] docusate sodium (COLACE) 100 MG capsule Take 1 capsule (100 mg total) by mouth 2 (two) times daily. 28 capsule 0  . escitalopram (LEXAPRO) 20 MG tablet Take 20 mg by mouth daily.    . ferrous sulfate (FERROUSUL) 325 (65 FE) MG tablet Take 1 tablet (325 mg total) by mouth 3 (three) times daily with meals for 14 days. 42 tablet 0  . fluticasone (FLONASE) 50 MCG/ACT nasal spray Place 1 spray into both nostrils daily.     [name]  gabapentin (NEURONTIN) 300 MG capsule Take 300 mg by mouth daily as needed (pain).     [name] glipiZIDE (GLUCOTROL) 5 MG tablet Take 5 mg by mouth 2 (two) times daily.    [name] glucagon (GLUCAGON EMERGENCY) 1 MG injection Inject 1 mg into the muscle once as needed (blood sugar less than 60).    . Insulin Glargine (BASAGLAR KWIKPEN) 100 UNIT/ML Inject 20-30 Units into the skin daily.    [name] lactulose (CHRONULAC) 10 GM/15ML solution Take 30 mLs (20 g total) by mouth 2 (two) times daily. (Patient taking differently: Take 10 g by mouth 2 (two) times a week. )    . loratadine (CLARITIN) 10 MG tablet Take 10 mg by mouth daily.    . methocarbamol (ROBAXIN) 500 MG tablet Take 1 tablet (500 mg total) by mouth every 6 (six) hours as needed for muscle spasms. 40 tablet 0  . Multiple Vitamin (MULTIVITAMIN WITH MINERALS) TABS tablet Take 1 tablet by mouth daily.    . polyethylene glycol (MIRALAX / GLYCOLAX) 17 g packet Take 17 g by mouth 2 (two) times daily. 28 packet 0  . rifaximin (XIFAXAN) 550 MG TABS tablet Take 550 mg by mouth 2 (two) times daily.    . rivaroxaban (XARELTO) 10 MG TABS tablet Take 1 tablet (10 mg total) by mouth daily. 14 tablet 0  .  sitaGLIPtin-metformin (JANUMET) 50-500 MG per tablet Take 1 tablet by mouth 2 (two) times daily with a meal.    . traMADol (ULTRAM) 50 MG tablet Take 1-2 tablets (50-100 mg total) by mouth every 6 (six) hours as needed (1 pill scale 1-5; 2 pills scale 6-10). 20 tablet 0  . traZODone (DESYREL) 50 MG tablet Take 50 mg by mouth at bedtime as needed for sleep.       Drug Regimen Review  Drug regimen was reviewed and remains appropriate with no significant issues identified  Home: Home Living Family/patient expects to be discharged to:: Private residence Living Arrangements: Spouse/significant other   Functional History:    Functional Status:  Mobility:          ADL:    Cognition: Cognition Orientation Level: Oriented X4     There were no vitals taken for this visit. Physical Exam  Nursing note and vitals reviewed. Constitutional: She is oriented to person, place, and time. She appears well-developed and well-nourished.  Older female with daughter at bedside as well as nursing, sitting up in bed, appropriate, NAD  HENT:  Head: Normocephalic and atraumatic.  Nose: Nose normal.  Mouth/Throat: Oropharynx is clear and moist. No oropharyngeal exudate.  No facial doop Tongue midline Facial sensation intact   Eyes: Pupils are equal, round, and reactive to light. Conjunctivae and EOM are normal. No scleral icterus.  Neck: No tracheal deviation present.  Cardiovascular:  RRR- no JVD  Respiratory: No stridor.  CTA B/L- good air movement  GI:  Soft, mild LLQ tenderness- no rebound; ND, (+) normoactive BS  Musculoskeletal:     Cervical back: Normal range of motion and neck supple.     Comments: Tight heel cord on left with limited DF.   UEs- deltoids, biceps, triceps, WE, grip and finger abd 5/5 B/L LLE- HF, KE, KF DF and PF 5-/5, but is tight in DF/PF on LLE RLE_ HF at least 3-/5- otherwise, missing RLE  Neurological: She is alert and oriented to person, place, and time.    Sensation intact to light touch on R AKA and LLE  No clonus on  LLE  Skin: Skin is warm and dry.  Buttocks- skin blanchable- no skin breakdown Bruise notable on L medial knee Has original dressing on R AKA from surgeon  Psychiatric:  Slightly flattened affect' appropriate    Results for orders placed or performed during the hospital encounter of 07/04/19 (from the past 48 hour(s))  Glucose, capillary     Status: Abnormal   Collection Time: 07/04/19  2:21 PM  Result Value Ref Range   Glucose-Capillary 143 (H) 70 - 99 mg/dL    Comment: Glucose reference range applies only to samples taken after fasting for at least 8 hours.   No results found.     Medical Problem List and Plan: 1.  Impaired function, ADLs and mobility secondary to R AKA due to failed TKRs  -patient may  Shower if covers R AKA  -ELOS/Goals: 2-2.5 weeks; min assist to supervision 2.  Antithrombotics: -DVT/anticoagulation:  Mechanical: Sequential compression devices, below knee Bilateral lower extremities- cannot do lovenox due to low platelets  -antiplatelet therapy: N/A 3. Pain Management: Tramadol 50 mg qid with robaxin/Hydrocodone prn 4. Mood: LCSW to follow for evaluation and support.   -antipsychotic agents: N/A 5. Neuropsych: This patient is capable of making decisions on her own behalf. 6. Skin/Wound Care: Monitor wound for healing once surgical dressing removed.  7. Fluids/Electrolytes/Nutrition: Monitor I/O. Check lytes in am. 8.  Cirrhosis of the liver: Ammonia level-28. Continue Xifaxan.   -if gets constipated or confused, add Lactulose 9. ABLA: Will recheck labs in am. 10. Thrombocytopenia: Monitor for signs of bleeding. Baseline at 74-->51. .  11. Chronic hyponatremia: Continue to monitor.  12. T2DM: Hgb A1c-5.2. Continue glucotrol, trajenta, metformin and Lantus 20 units daily. Uses Lantus 20 units prn BS>180.  13. H/o depression: ON Lexapro, Xanax and Wellbutrin.   Ivan Anchors Love,  PA-C 07/04/2019  I have personally performed a face to face diagnostic evaluation of this patient and formulated the key components of the plan.  Additionally, I have personally reviewed laboratory data, imaging studies, as well as relevant notes and concur with the physician assistant's documentation above.   The patient's status has not changed from the original H&P.  Any changes in documentation from the acute care chart have been noted above.     Courtney Heys, MD 07/04/2019

## 2019-07-04 NOTE — Discharge Summary (Signed)
Physician Discharge Summary  Patient ID: Brittany Russo MRN: [id number] DOB/AGE: 72-Jul-1947 72 y.o.  Admit date: 06/29/2019 Discharge date:  07/04/2019  Procedures:  Procedure(s) (LRB): AMPUTATION ABOVE KNEE through knee (Right) LEFT KNEE INJECTION (Left)  Attending Physician:  Dr. Paralee Cancel   Admission Diagnoses:   Failed right TKA, S/P resection of TKA due to infection  Discharge Diagnoses:  Active Problems:   S/P right knee disarticulation amputation   Above knee amputation of right lower extremity (HCC)  Past Medical History:  Diagnosis Date  . Acute and subacute hepatic failure without coma   . Allergy   . Anasarca   . Anxiety   . Arthritis   . Ascites 09/2018   no SBP on 850 cc tap 10/13/18  . Asthma    allery induced  . Cirrhosis of liver ([place]) ~ 2004   from NAFLD.  Dx ~ 2004.  Dr Dorcas Mcmurray at [facility]/    . Depression   . Diabetes mellitus without complication ([place])    type 2  . Eczema of both hands   . Endometrial cancer ([place]) 1996, 2003   hysterectomy 1996.  recurrence 2003, treated with radiation.    . Generalized edema   . GI hemorrhage   . 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 in [place]  . Hyperlipidemia   . Hypertension   . Muscle weakness   . Seizures ([place])    last seizure June 07, 2014   . Thrombocytopenia ([place])   . Thyroid nodule     HPI:    Brittany Russo, 74 y.o. female, has a history of pain and functional disability in the right knee due to failed right TKA and subsequent infection and has failed non-surgical conservative treatments   Onset of symptoms was gradual, starting with a fall and periprosthetic distal femur fracture. The patient noted prior procedures on the knee to include  arthroplasty and subsequent resection on the right knee(s).  Patient currently rates pain in the right knee(s) at 8 out of 10 with and more concerns as she is unable to really use the leg.  Risks,  benefits and expectations were discussed with the patient.  Risks including but not limited to the risk of anesthesia, blood clots, nerve damage, blood vessel damage, failure of the wound to heal,  The potential for subsequent surgeries,  infection and up to and including death.  Patient understand the risks, benefits and expectations and wishes to proceed with surgery.  PCP: Rosalee Kaufman, PA-C   Discharged Condition: good  Hospital Course:  Patient underwent the above stated procedure on 06/29/2019. Patient tolerated the procedure well and brought to the recovery room in good condition and subsequently to the floor.  POD #1 BP: 122/53 ; Pulse: 78 ; Temp: 98 F (36.7 C) ; Resp: 14 Patient reports pain as mild, pain controlled with medication.  Dr. Alvan Dame described the procedure, findings and expectations moving forward.  CIR consult to be placed as a plan for after her hospital stay to help in the rehab/recovery of her surgery. Incision: dressing C/D/I, no cellulitis present and compartment soft.   LABS  Basename    HGB     8.0  HCT     26.2   POD #2  BP: 127/80 ; Pulse: 80 ; Temp: 98.4 F (36.9 C) ; Resp: 16 Patient reports pain as mild.  Patient has no complaints other than increased pain with movement  and rolling in bed.  Plan is to go to CIR after hospital stay, most likely on Monday. General - Patient is Alert and Oriented.  Extremity - Neurologically intact, Neurovascular intact. Dressing/Incision - clean, dry and intact  LABS  Basename    HGB     8.2  HCT     25.6   POD #3  BP: 136/51 ; Pulse: 74 ; Temp: 98.6 F (37 C) ; Resp: 16 Patient reports pain as mild.  No c/o. Neurologically intact, ABD soft, Neurovascular intact, Sensation intact distally, Intact pulses distally, Dorsiflexion/Plantar flexion intact, Incision: dressing C/D/I and no drainage, No cellulitis present, Compartment soft.  LABS   No new labs  POD #4  BP: 130/54 ; Pulse: 72 ; Temp: 97.9 F (36.6 C) ;  Resp: 15 Patient reports pain as mild, pain controlled. No reported events throughout the night.  Ammonia levels obtained which reveal normal levels, previous confusion possibly due to increase ammonia levels.  CBC reveals stable labs.  Discussed allowing her to have wound dressing changes at CIR due to plan for stump shrinking dressings.  Discussed that if stable we would attempt to get her discharged to [facility] today or tomorrow,. She is a little nervous about what they are going to be doing with her, but also wanting to progress.  Incision: scant drainage.  No cellulitis present.  Compartment soft.  LABS  Basename    HGB     8.0  HCT     24.9     Discharge Exam: General appearance: alert, cooperative and no distress Extremities: no ulcers, gangrene or trophic changes    Discharge disposition: CIR with follow up in 2 weeks   Follow-up Information    Paralee Cancel, MD. Schedule an appointment as soon as possible for a visit in 2 weeks.   Specialty: Orthopedic Surgery Contact information: [address] [place] [address] [phone]           Discharge Instructions    Call MD / Call 911   Complete by: As directed    If you experience chest pain or shortness of breath, CALL 911 and be transported to the hospital emergency room.  If you develope a fever above 101 F, pus (white drainage) or increased drainage or redness at the wound, or calf pain, call your surgeon's office.   Constipation Prevention   Complete by: As directed    Drink plenty of fluids.  Prune juice may be helpful.  You may use a stool softener, such as Colace (over the counter) 100 mg twice a day.  Use MiraLax (over the counter) for constipation as needed.   Diet - low sodium heart healthy   Complete by: As directed    Discharge instructions   Complete by: As directed    Maintain surgical dressing until CIR.  CIR to work with dressing for stump care.   Non weight bearing   Complete by: As  directed    Laterality: right   Extremity: Lower      Allergies as of 07/04/2019      Reactions   Asa [aspirin] Other (See Comments)   Pt not allergic but cannot take due to bleeding    Other Other (See Comments)   Any jewelry metal-hives and itching       Medication List    STOP taking these medications   feeding supplement (PRO-STAT SUGAR FREE 64) Liqd   HYDROcodone-acetaminophen 5-325 MG tablet Commonly known as: Norco  insulin aspart 100 UNIT/ML injection Commonly known as: novoLOG   ondansetron 4 MG tablet Commonly known as: ZOFRAN     TAKE these medications   acetaminophen 500 MG tablet Commonly known as: TYLENOL Take 1-2 tablets (500-1,000 mg total) by mouth every 8 (eight) hours as needed.   albuterol 108 (90 Base) MCG/ACT inhaler Commonly known as: VENTOLIN HFA Inhale 1-2 puffs into the lungs every 6 (six) hours as needed for wheezing or shortness of breath.   ALPRAZolam 0.25 MG tablet Commonly known as: XANAX Take 1 tablet (0.25 mg total) by mouth 2 (two) times daily.   atenolol 50 MG tablet Commonly known as: TENORMIN Take 50 mg by mouth daily.   atorvastatin 80 MG tablet Commonly known as: LIPITOR Take 80 mg by mouth daily at 8 pm.   Basaglar KwikPen 100 UNIT/ML Inject 20-30 Units into the skin daily. What changed: Another medication with the same name was removed. Continue taking this medication, and follow the directions you see here.   buPROPion 150 MG 24 hr tablet Commonly known as: WELLBUTRIN XL Take 150 mg by mouth daily.   docusate sodium 100 MG capsule Commonly known as: Colace Take 1 capsule (100 mg total) by mouth 2 (two) times daily.   escitalopram 20 MG tablet Commonly known as: LEXAPRO Take 20 mg by mouth daily.   ferrous sulfate 325 (65 FE) MG tablet Commonly known as: FerrouSul Take 1 tablet (325 mg total) by mouth 3 (three) times daily with meals for 14 days. What changed: when to take this   fluticasone 50 MCG/ACT  nasal spray Commonly known as: FLONASE Place 1 spray into both nostrils daily.   gabapentin 300 MG capsule Commonly known as: NEURONTIN Take 300 mg by mouth daily as needed (pain).   glipiZIDE 5 MG tablet Commonly known as: GLUCOTROL Take 5 mg by mouth 2 (two) times daily.   Glucagon Emergency 1 MG Kit Inject 1 mg into the muscle once as needed (blood sugar less than 60).   lactulose 10 GM/15ML solution Commonly known as: CHRONULAC Take 30 mLs (20 g total) by mouth 2 (two) times daily. What changed:   how much to take  when to take this   loratadine 10 MG tablet Commonly known as: CLARITIN Take 10 mg by mouth daily.   methocarbamol 500 MG tablet Commonly known as: Robaxin Take 1 tablet (500 mg total) by mouth every 6 (six) hours as needed for muscle spasms.   multivitamin with minerals Tabs tablet Take 1 tablet by mouth daily.   polyethylene glycol 17 g packet Commonly known as: MIRALAX / GLYCOLAX Take 17 g by mouth 2 (two) times daily.   rifaximin 550 MG Tabs tablet Commonly known as: XIFAXAN Take 550 mg by mouth 2 (two) times daily.   rivaroxaban 10 MG Tabs tablet Commonly known as: Xarelto Take 1 tablet (10 mg total) by mouth daily.   sitaGLIPtin-metformin 50-500 MG tablet Commonly known as: JANUMET Take 1 tablet by mouth 2 (two) times daily with a meal.   traMADol 50 MG tablet Commonly known as: Ultram Take 1-2 tablets (50-100 mg total) by mouth every 6 (six) hours as needed (1 pill scale 1-5; 2 pills scale 6-10).   traZODone 50 MG tablet Commonly known as: DESYREL Take 50 mg by mouth at bedtime as needed for sleep.            Discharge Care Instructions  (From admission, onward)         Start  Ordered   07/03/19 0000  Non weight bearing    Question Answer Comment  Laterality right   Extremity Lower      07/03/19 0927           Signed: [place]. Jonise Weightman   PA-C  07/04/2019, 10:17 AM

## 2019-07-04 NOTE — Progress Notes (Signed)
Patient ID: Brittany Russo, female   DOB: [birth year], 74 y.o.   MRN: [id number] Pt admitted to unit. Patient and daughter educated and notified of fall and belonging policy. Own wheelchair at bedside. Per Verdis Frederickson DON, Daughter-Melanie okay to spend the night tonight and exception to visitation to allow other children to visit once during patient stay. Marland KitchenSanda Linger, LPN

## 2019-07-04 NOTE — Progress Notes (Signed)
PMR Admission Coordinator Pre-Admission Assessment   Patient: Brittany Russo is an 74 y.o., female MRN: [id number] DOB: 10-22-1945 Height: 5' 5"  (165.1 cm) Weight: 71.2 kg   Insurance Information HMO:     PPO:      PCP:      IPA:      80/20:      OTHER:  PRIMARY: Medicare A and B      Policy#: [id number]      Subscriber: pt CM Name:       Phone#:      Fax#:  Pre-Cert#: verified online      Employer:  Benefits:  Phone #:      Name:  Eff. Date: A and B 02/20/13     Deduct: $1484      Out of Pocket Max:       Life Max:  CIR: 100% per Medicare guidelines      SNF: 20 full days Outpatient: 80%     Co-Pay: 20% Home Health: 100%      Co-Pay:  DME: 80%     Co-Pay: 20% Providers: pt choice   Secondary: Generic Publishing rights manager #: [id number]            Subscriber: pt CM Name:       Phone:             Fax:      Pre-Cert:         Employer: Benefits: Phone #: [phone]       Name: Irene Shipper Date:          Deduct:            Out of Pocket Max:     Life Max: CIR:     SNF: Outpatient:      Co-Pay: Home Health:              Co-Pay: DME:               Co-Pay: Providers:   Tertiary: Medicaid      Policy#: [id number] p      Subscriber: pt CM Name:       Phone#:      Fax#:  Pre-Cert#: verified online, coverage code [place]      Employer:  Benefits:  Phone #: [phone]     Name: n/a Eff. Date: active as of 07/04/2019     Deduct:       Out of Pocket Max:       Life Max:  CIR:       SNF:  Outpatient:      Co-Pay:  Home Health:       Co-Pay:  DME:      Co-Pay:    Medicaid Application Date:       Case Manager:  Disability Application Date:       Case Worker:    The "Data Collection Information Summary" for patients in Inpatient Rehabilitation Facilities with attached "Privacy Act [place] Records" was provided and verbally reviewed with: Patient   Emergency Contact Information Contact Information     Name Relation Home Work [place] Daughter Antelope Spouse [phone]   [phone]    Janin, Kozlowski Daughter [phone]   [phone]    Birder Robson Daughter [phone]   [phone]         Current Medical History  Patient Admitting Diagnosis: R AKA   History of Present Illness: Pt  is a 74 y/o female with PMH of cirrhosis, ascites, depression, HTN, and DM2 admitted to [facility] on 06/29/2019 for R AKA.  Pt underwent R TKA on 09/22/2018 per Dr. Alvan Dame, and fell after leaving the hospital on 7/4, sustaining a R distal femur periprosthetic fracture.  She underwent multiple surgical and non-surgical attempts to salvage the knee, and ultimately opted for a R AKA, per Dr. Alvan Dame on 06/29/2019.  Post op course pain management.  Therapy evaluations were completed and pt was recommended for CIR.    Patient's medical record from [facility] has been reviewed by the rehabilitation admission coordinator and physician.   Past Medical History      Past Medical History:  Diagnosis Date  . Acute and subacute hepatic failure without coma    . Allergy    . Anasarca    . Anxiety    . Arthritis    . Ascites 09/2018    no SBP on 850 cc tap 10/13/18  . Asthma      allery induced  . Cirrhosis of liver ([place]) ~ 2004    from NAFLD.  Dx ~ 2004.  Dr Dorcas Mcmurray at [facility]/    . Depression    . Diabetes mellitus without complication ([place])      type 2  . Eczema of both hands    . Endometrial cancer ([place]) 1996, 2003    hysterectomy 1996.  recurrence 2003, treated with radiation.    . Generalized edema    . GI hemorrhage    . Hematochezia 2009    radiation proctosigmoiditis on colonoscopy 06/2007, DR Allyson Sabal Mir at [facility] in [place]  . Hyperlipidemia    . Hypertension    . Muscle weakness    . Seizures ([place])      last seizure June 07, 2014   . Thrombocytopenia ([place])    . Thyroid nodule        Family History   family history includes Cancer in her father and maternal grandmother; Diabetes in her mother; Heart  disease in her father, maternal grandfather, and mother.   Prior Rehab/Hospitalizations Has the patient had prior rehab or hospitalizations prior to admission? Yes   Has the patient had major surgery during 100 days prior to admission? Yes              Current Medications   Current Facility-Administered Medications:  .  0.9 %  sodium chloride infusion, , Intravenous, Continuous, Norman Herrlich, Stopped at 06/30/19 7494 .  acetaminophen (TYLENOL) tablet 325-650 mg, 325-650 mg, Oral, Q6H PRN, Danae Orleans, PA-C, 650 mg at 07/02/19 1633 .  albuterol (PROVENTIL) (2.5 MG/3ML) 0.083% nebulizer solution 3 mL, 3 mL, Inhalation, Q6H PRN, Danae Orleans, PA-C .  ALPRAZolam Duanne Moron) tablet 0.25 mg, 0.25 mg, Oral, BID, Danae Orleans, PA-C, 0.25 mg at 07/04/19 0933 .  alum & mag hydroxide-simeth (MAALOX/MYLANTA) 200-200-20 MG/5ML suspension 15 mL, 15 mL, Oral, Q4H PRN, Babish, Matthew, PA-C .  atenolol (TENORMIN) tablet 50 mg, 50 mg, Oral, Daily, Danae Orleans, PA-C, 50 mg at 07/04/19 0934 .  atorvastatin (LIPITOR) tablet 80 mg, 80 mg, Oral, Q2000, Danae Orleans, PA-C, 80 mg at 07/03/19 2243 .  bisacodyl (DULCOLAX) suppository 10 mg, 10 mg, Rectal, Daily PRN, Babish, Matthew, PA-C .  buPROPion (WELLBUTRIN XL) 24 hr tablet 150 mg, 150 mg, Oral, Daily, Danae Orleans, PA-C, 150 mg at 07/04/19 0934 .  diphenhydrAMINE (BENADRYL) 12.5 MG/5ML elixir 12.5-25 mg, 12.5-25 mg, Oral, Q4H PRN, Danae Orleans,  PA-C .  docusate sodium (COLACE) capsule 100 mg, 100 mg, Oral, BID, Danae Orleans, PA-C, 100 mg at 07/01/19 0917 .  escitalopram (LEXAPRO) tablet 20 mg, 20 mg, Oral, Daily, Babish, Matthew, PA-C, 20 mg at 07/04/19 0933 .  ferrous sulfate tablet 325 mg, 325 mg, Oral, BID WC, Danae Orleans, PA-C, 325 mg at 07/04/19 0934 .  fluticasone (FLONASE) 50 MCG/ACT nasal spray 1 spray, 1 spray, Each Nare, Daily, Babish, Matthew, PA-C, 1 spray at 07/04/19 0948 .  gabapentin (NEURONTIN) capsule 300 mg,  300 mg, Oral, Daily PRN, Danae Orleans, PA-C, 300 mg at 06/30/19 0845 .  glipiZIDE (GLUCOTROL) tablet 5 mg, 5 mg, Oral, BID AC, Babish, Matthew, PA-C, 5 mg at 07/01/19 0818 .  HYDROcodone-acetaminophen (NORCO) 7.5-325 MG per tablet 1-2 tablet, 1-2 tablet, Oral, Q4H PRN, Danae Orleans, PA-C .  HYDROcodone-acetaminophen (NORCO/VICODIN) 5-325 MG per tablet 1-2 tablet, 1-2 tablet, Oral, Q4H PRN, Danae Orleans, PA-C, 1 tablet at 07/02/19 2125 .  HYDROmorphone (DILAUDID) injection 0.5-1 mg, 0.5-1 mg, Intravenous, Q2H PRN, Babish, Matthew, PA-C .  insulin aspart (novoLOG) injection 0-15 Units, 0-15 Units, Subcutaneous, TID WC, Babish, Rodman Key, PA-C, 3 Units at 07/03/19 1805 .  insulin aspart (novoLOG) injection 0-5 Units, 0-5 Units, Subcutaneous, QHS, Danae Orleans, PA-C, 2 Units at 06/30/19 2137 .  insulin glargine (LANTUS) injection 20 Units, 20 Units, Subcutaneous, Daily, Danae Orleans, PA-C, 20 Units at 07/04/19 0935 .  linagliptin (TRADJENTA) tablet 5 mg, 5 mg, Oral, Daily, Paralee Cancel, MD, 5 mg at 07/04/19 0932 .  lip balm (CARMEX) ointment, , Topical, PRN, Paralee Cancel, MD .  loratadine (CLARITIN) tablet 10 mg, 10 mg, Oral, Daily, Danae Orleans, PA-C, 10 mg at 07/04/19 0933 .  magnesium citrate solution 1 Bottle, 1 Bottle, Oral, Once PRN, Babish, Matthew, PA-C .  menthol-cetylpyridinium (CEPACOL) lozenge 3 mg, 1 lozenge, Oral, PRN **OR** phenol (CHLORASEPTIC) mouth spray 1 spray, 1 spray, Mouth/Throat, PRN, Babish, Matthew, PA-C .  metFORMIN (GLUCOPHAGE) tablet 500 mg, 500 mg, Oral, BID WC, Paralee Cancel, MD, 500 mg at 07/01/19 0818 .  methocarbamol (ROBAXIN) tablet 500 mg, 500 mg, Oral, Q6H PRN, 500 mg at 07/02/19 1633 **OR** methocarbamol (ROBAXIN) 500 mg in dextrose 5 % 50 mL IVPB, 500 mg, Intravenous, Q6H PRN, Babish, Matthew, PA-C .  metoCLOPramide (REGLAN) tablet 5-10 mg, 5-10 mg, Oral, Q8H PRN **OR** metoCLOPramide (REGLAN) injection 5-10 mg, 5-10 mg, Intravenous, Q8H PRN,  Babish, Matthew, PA-C .  ondansetron (ZOFRAN) tablet 4 mg, 4 mg, Oral, Q6H PRN **OR** ondansetron (ZOFRAN) injection 4 mg, 4 mg, Intravenous, Q6H PRN, Babish, Matthew, PA-C .  polyethylene glycol (MIRALAX / GLYCOLAX) packet 17 g, 17 g, Oral, BID, Danae Orleans, PA-C, 17 g at 07/01/19 0919 .  rifaximin (XIFAXAN) tablet 550 mg, 550 mg, Oral, BID, Danae Orleans, PA-C, 550 mg at 07/04/19 0933 .  rivaroxaban (XARELTO) tablet 10 mg, 10 mg, Oral, Q24H, Babish, Matthew, PA-C, 10 mg at 07/04/19 0947 .  traMADol (ULTRAM) tablet 50 mg, 50 mg, Oral, Q6H, Babish, Matthew, PA-C, 50 mg at 07/04/19 2671 .  traZODone (DESYREL) tablet 50 mg, 50 mg, Oral, QHS PRN, Babish, Matthew, PA-C   Patients Current Diet:  Diet Order                      Diet - low sodium heart healthy           Diet Carb Modified Fluid consistency: Thin; Room service appropriate? Yes  Diet effective now  Precautions / Restrictions Precautions Precautions: Fall Precaution Comments: R AKA, like to wear her L AFO/shoe Restrictions Weight Bearing Restrictions: Yes RLE Weight Bearing: Non weight bearing    Has the patient had 2 or more falls or a fall with injury in the past year? Yes   Prior Activity Level Household: w/c level at baseline, supervision/CGA for slide board transfers; has been this level since 09/2018 following initial TKA/periprosthetic fx on the R   Prior Functional Level Self Care: Did the patient need help bathing, dressing, using the toilet or eating? Needed some help   Indoor Mobility: Did the patient need assistance with walking from room to room (with or without device)? Dependent, w/c level   Stairs: Did the patient need assistance with internal or external stairs (with or without device)? Dependent, w/c level   Functional Cognition: Did the patient need help planning regular tasks such as shopping or remembering to take medications? Needed some help   Home Assistive Devices /  Equipment Home Assistive Devices/Equipment: Wheelchair, Other (Comment), Bedside commode/3-in-1 Home Equipment: Wheelchair - manual, Adaptive equipment, Bedside commode, Tub bench   Prior Device Use: Indicate devices/aids used by the patient prior to current illness, exacerbation or injury? Manual wheelchair and slide board   Current Functional Level Cognition   Overall Cognitive Status: Within Functional Limits for tasks assessed Orientation Level: Oriented X4 General Comments: slightly impaired cognition/safety judgement (pain meds) needed help dialing phone    Extremity Assessment (includes Sensation/Coordination)   Upper Extremity Assessment: Generalized weakness  Lower Extremity Assessment: Generalized weakness, LLE deficits/detail LLE Deficits / Details: 2+ for hip flexion, 3/5 for knee extension     ADLs         Mobility   Overal bed mobility: Needs Assistance Bed Mobility: Rolling, Supine to Sit Rolling: Mod assist Supine to sit: Mod assist, Max assist Sit to supine: Mod assist, Max assist General bed mobility comments: side to side rolling to change bed pad and increased assist to sit up onto EOB.  Increased time to self correct to midline and still needed Min Assist to prevent posterior LOB.  R LE pain increased in seated position.  Pt did static sit EOB x 5 min while brushing hair as we waited for + 2 assist.     Transfers   Overall transfer level: Needs assistance Transfers: Lateral/Scoot Transfers  Lateral/Scoot Transfers: Max assist General transfer comment: Assisted with Sliding Board transfer from elevated bed to drop arm recliner + 2 assist for safety/completion.  Increased pain noted during activity.   + 2 assist to adjust upright and position to comfort.     Ambulation / Gait / Stairs / Wheelchair Mobility   Ambulation/Gait General Gait Details: non amb at this time     Posture / Balance Dynamic Sitting Balance Sitting balance - Comments: pt reliant on UE  support  Balance Overall balance assessment: Needs assistance Sitting-balance support: Bilateral upper extremity supported, Feet supported Sitting balance-Leahy Scale: Poor Sitting balance - Comments: pt reliant on UE support      Special needs/care consideration BiPAP/CPAP no CPM no Continuous Drip IV no Dialysis no        Days n/a Life Vest no Oxygen no Special Bed no Trach Size no Wound Vac (area) no      Location n/a Skin incision to R residual limb  Bowel mgmt: continent Bladder mgmt: continent Diabetic mgmt: yes Behavioral consideration no Chemo/radiation no    Previous Home Environment (from acute therapy documentation) Living Arrangements: Spouse/significant other Available Help at Discharge: Family Type of Home: Apartment Home Layout: One level Home Access: Level entry, Building control surveyor Shower/Tub: Chiropodist: Handicapped height Bathroom Accessibility: Yes Home Care Services: Yes Type of Home Care Services: Other (Comment) Home Care Agency (if known): PCS worker/ family  Aging and disability services Additional Comments: pt reports she has a CNA come to assist her from 10am-1pm every day. She assists with bathing, dressing. pt reports he daughter helps as well and her husband.    Discharge Living Setting Plans for Discharge Living Setting: Patient's home Type of Home at Discharge: Apartment Discharge Home Layout: One level Discharge Home Access: Level entry, Elevator Discharge Bathroom Shower/Tub: Tub/shower unit Discharge Bathroom Toilet: Handicapped height Discharge Bathroom Accessibility: Yes How Accessible: Accessible via wheelchair Does the patient have any problems obtaining your medications?: No   Social/Family/Support Systems Patient Roles: Spouse Contact Information: Amijah Timothy [phone] (cell) Anticipated Caregiver: Merry Proud is primary contact (daughter) Anticipated Caregiver's Contact  Information: [phone] Ability/Limitations of Caregiver: works, pt has 24/7 at baseline between family and hired caregivers Caregiver Availability: 24/7 Discharge Plan Discussed with Primary Caregiver: Yes Is Caregiver In Agreement with Plan?: Yes Does Caregiver/Family have Issues with Lodging/Transportation while Pt is in Rehab?: No   Goals/Additional Needs Patient/Family Goal for Rehab: PT/OT supervision/CGA w/c level Expected length of stay: 18-21 days Dietary Needs: carb modified/thin Equipment Needs: the armrest on pt's w/c from home is broken Additional Information: uses double upright AFO on the left; her brother is very ill/passing away at time of admission to CIR Pt/Family Agrees to Admission and willing to participate: Yes Program Orientation Provided & Reviewed with Pt/Caregiver Including Roles  & Responsibilities: Yes   Decrease burden of Care through IP rehab admission: n/a   Possible need for SNF placement upon discharge: not anticipated. Pt with good family support, and motivated to return to prior level of function.    Patient Condition: I have reviewed medical records from [facility], spoken with CM, and patient and daughter. I met with patient at the bedside for inpatient rehabilitation assessment.  Patient will benefit from ongoing PT and OT, can actively participate in 3 hours of therapy a day 5 days of the week, and can make measurable gains during the admission.  Patient will also benefit from the coordinated team approach during an Inpatient Acute Rehabilitation admission.  The patient will receive intensive therapy as well as Rehabilitation physician, nursing, social worker, and care management interventions.  Due to safety, skin/wound care, disease management, medication administration, pain management and patient education the patient requires 24 hour a day rehabilitation nursing.  The patient is currently max +2 with mobility and basic ADLs.  Discharge  setting and therapy post discharge at home with home health is anticipated.  Patient has agreed to participate in the Acute Inpatient Rehabilitation Program and will admit today.   Preadmission Screen Completed By:  Michel Santee, PT, DPT 07/04/2019 10:43 AM ______________________________________________________________________   Discussed status with Dr. Dagoberto Ligas on 07/04/19  at 11:05 AM  and received approval for admission today.   Admission Coordinator:  Michel Santee, PT, DPT time 11:06 AM Sudie Grumbling 07/04/19     Assessment/Plan: Diagnosis: 1. Does the need for close, 24 hr/day Medical supervision in concert with the patient's rehab needs make it unreasonable for this patient to  be served in a less intensive setting? Yes 2. Co-Morbidities requiring supervision/potential complications: HTN, DM, depression, anxiety, asthma, L AKA, GI bleed 3. Due to bladder management, safety, skin/wound care, disease management, medication administration, pain management and patient education, does the patient require 24 hr/day rehab nursing? Yes 4. Does the patient require coordinated care of a physician, rehab nurse, PT, OT, and SLP to address physical and functional deficits in the context of the above medical diagnosis(es)? Yes Addressing deficits in the following areas: balance, endurance, locomotion, strength, transferring, bathing, dressing, feeding, grooming and toileting 5. Can the patient actively participate in an intensive therapy program of at least 3 hrs of therapy 5 days a week? Yes 6. The potential for patient to make measurable gains while on inpatient rehab is good 7. Anticipated functional outcomes upon discharge from inpatient rehab: supervision and min assist PT, supervision and min assist OT, n/a SLP 8. Estimated rehab length of stay to reach the above functional goals is: 18-21 days 9. Anticipated discharge destination: Home 10. Overall Rehab/Functional Prognosis: good     MD  Signature:

## 2019-07-05 ENCOUNTER — Inpatient Hospital Stay (HOSPITAL_COMMUNITY): Payer: 59

## 2019-07-05 ENCOUNTER — Inpatient Hospital Stay (HOSPITAL_COMMUNITY): Payer: 59 | Admitting: Physical Therapy

## 2019-07-05 ENCOUNTER — Inpatient Hospital Stay (HOSPITAL_COMMUNITY): Payer: 59 | Admitting: Occupational Therapy

## 2019-07-05 DIAGNOSIS — G8918 Other acute postprocedural pain: Secondary | ICD-10-CM | POA: Insufficient documentation

## 2019-07-05 DIAGNOSIS — E119 Type 2 diabetes mellitus without complications: Secondary | ICD-10-CM

## 2019-07-05 DIAGNOSIS — G546 Phantom limb syndrome with pain: Secondary | ICD-10-CM

## 2019-07-05 DIAGNOSIS — K7460 Unspecified cirrhosis of liver: Secondary | ICD-10-CM

## 2019-07-05 DIAGNOSIS — R188 Other ascites: Secondary | ICD-10-CM

## 2019-07-05 DIAGNOSIS — D696 Thrombocytopenia, unspecified: Secondary | ICD-10-CM

## 2019-07-05 DIAGNOSIS — Z794 Long term (current) use of insulin: Secondary | ICD-10-CM

## 2019-07-05 DIAGNOSIS — E871 Hypo-osmolality and hyponatremia: Secondary | ICD-10-CM

## 2019-07-05 DIAGNOSIS — E1165 Type 2 diabetes mellitus with hyperglycemia: Secondary | ICD-10-CM

## 2019-07-05 LAB — COMPREHENSIVE METABOLIC PANEL
ALT: 28 U/L (ref 0–44)
AST: 40 U/L (ref 15–41)
Albumin: 2.1 g/dL — ABNORMAL LOW (ref 3.5–5.0)
Alkaline Phosphatase: 108 U/L (ref 38–126)
Anion gap: 8 (ref 5–15)
BUN: 13 mg/dL (ref 8–23)
CO2: 26 mmol/L (ref 22–32)
Calcium: 8.2 mg/dL — ABNORMAL LOW (ref 8.9–10.3)
Chloride: 100 mmol/L (ref 98–111)
Creatinine, Ser: 0.73 mg/dL (ref 0.44–1.00)
GFR calc Af Amer: >60 mL/min (ref >60)
GFR calc non Af Amer: >60 mL/min (ref >60)
Glucose, Bld: 157 mg/dL — ABNORMAL HIGH (ref 70–99)
Potassium: 4 mmol/L (ref 3.5–5.1)
Sodium: 134 mmol/L — ABNORMAL LOW (ref 135–145)
Total Bilirubin: 1.5 mg/dL — ABNORMAL HIGH (ref 0.3–1.2)
Total Protein: 5 g/dL — ABNORMAL LOW (ref 6.5–8.1)

## 2019-07-05 LAB — CBC WITH DIFFERENTIAL/PLATELET
Abs Immature Granulocytes: 0.02 K/uL (ref 0.00–0.07)
Basophils Absolute: 0 K/uL (ref 0.0–0.1)
Basophils Relative: 1 %
Eosinophils Absolute: 0.3 K/uL (ref 0.0–0.5)
Eosinophils Relative: 7 %
HCT: 26.7 % — ABNORMAL LOW (ref 36.0–46.0)
Hemoglobin: 8.7 g/dL — ABNORMAL LOW (ref 12.0–15.0)
Immature Granulocytes: 0 %
Lymphocytes Relative: 17 %
Lymphs Abs: 0.8 K/uL (ref 0.7–4.0)
MCH: 30.4 pg (ref 26.0–34.0)
MCHC: 32.6 g/dL (ref 30.0–36.0)
MCV: 93.4 fL (ref 80.0–100.0)
Monocytes Absolute: 0.5 K/uL (ref 0.1–1.0)
Monocytes Relative: 11 %
Neutro Abs: 3 K/uL (ref 1.7–7.7)
Neutrophils Relative %: 64 %
Platelets: 65 K/uL — ABNORMAL LOW (ref 150–400)
RBC: 2.86 MIL/uL — ABNORMAL LOW (ref 3.87–5.11)
RDW: 18.5 % — ABNORMAL HIGH (ref 11.5–15.5)
WBC: 4.7 K/uL (ref 4.0–10.5)
nRBC: 0 % (ref 0.0–0.2)

## 2019-07-05 LAB — GLUCOSE, CAPILLARY
Glucose-Capillary: 152 mg/dL — ABNORMAL HIGH (ref 70–99)
Glucose-Capillary: 174 mg/dL — ABNORMAL HIGH (ref 70–99)
Glucose-Capillary: 60 mg/dL — ABNORMAL LOW (ref 70–99)
Glucose-Capillary: 76 mg/dL (ref 70–99)
Glucose-Capillary: 101 mg/dL — ABNORMAL HIGH (ref 70–99)

## 2019-07-05 LAB — AMMONIA: Ammonia: 53 umol/L — ABNORMAL HIGH (ref 9–35)

## 2019-07-05 MED ORDER — LACTULOSE 10 GM/15ML PO SOLN
20.0000 g | Freq: Two times a day (BID) | ORAL | Status: DC
  Administered 2019-07-09: 20 g via ORAL
  Filled 2019-07-05 (×6): qty 30

## 2019-07-05 MED ORDER — GABAPENTIN 100 MG PO CAPS
100.0000 mg | ORAL_CAPSULE | Freq: Two times a day (BID) | ORAL | Status: DC
  Administered 2019-07-05 – 2019-07-06 (×3): 100 mg via ORAL
  Filled 2019-07-05 (×3): qty 1

## 2019-07-05 NOTE — Progress Notes (Signed)
Physical Therapy Session Note  Patient Details  Name: Brittany Russo MRN: [id number] Date of Birth: [birth year]  Today's Date: 07/05/2019 PT Individual Time: 1300-1410 PT Individual Time Calculation (min): 70 min   Short Term Goals: Week 1:  PT Short Term Goal 1 (Week 1): Pt will initiate standing training PT Short Term Goal 2 (Week 1): Pt will transfer bed<>chair w/ min assist PT Short Term Goal 3 (Week 1): Pt will perform bed mobility w/ min assist PT Short Term Goal 4 (Week 1): Pt will maintain dynamic sitting balance w/ supervision  Skilled Therapeutic Interventions/Progress Updates:   Received pt supine in bed, pt agreeable to therapy, and denied any pain during session. Session focused on functional mobility/transfers, LE strength, dynamic sitting balance, simulated car transfer, amputee education, and improved endurance with activity. Pt transferred supine<>sitting EOB with min A with HOB elevated. Pt transferred bed<>WC via slideboard mod A. Pt required verbal cues for anterior weight shifting, head/hips relationship, and hand placement on board. Pt performed WC mobility 92f using bilateral UEs and supervision to ortho gym. Pt performed simulated car transfer using slideboard with mod A. Pt with increased anxiety and fear of falling. Therapist provided emotional support and encouragement. [facility] Pt transferred WC<>mat with slideboard mod A with same verbal cues. While sitting on mat pt worked on dynamic sitting balance throwing horseshoes x 3 trials with supervision. Pt transferred sit<>supine with min A for LE management. Pt performed the following exercises in supine with supervision and verbal cues for technique: -SLR x 5 bilaterally  -Hip abduction x5 bilaterally -L sidelying R hip abduction 2x5 -L sidelying R hip extension 2x5 Pt rolled L sidelying<>supine<>sitting EOB with max A due to no bedrails available. Pt transferred mat<>WC via  slideboard with mod A. [facility]. Pt agreed to sitting in WC until dinner time. Concluded session with pt sitting in WC, needs within reach, and seatbelt alarm on.   Therapy Documentation Precautions:  Restrictions Weight Bearing Restrictions: Yes RLE Weight Bearing: Non weight bearing  Therapy/Group: Individual Therapy AAlfonse AlpersPT, DPT   07/05/2019, 7:32 AM

## 2019-07-05 NOTE — Patient Care Conference (Signed)
Inpatient RehabilitationTeam Conference and Plan of Care Update Date: 07/05/2019   Time: 11:35 AM    Patient Name: Brittany Russo Record Number: [id number]  Date of Birth: 1945/10/15 Sex: Female         Room/Bed: 4M13C/4M13C-01 Payor Info: Payor: MEDICARE / Plan: MEDICARE PART A AND B / Product Type: *No Product type* /    Admit Date/Time:  07/04/2019  1:58 PM  Primary Diagnosis:  Amputation above knee [facility])  Patient Active Problem List   Diagnosis Date Noted  . Controlled type 2 diabetes mellitus with hyperglycemia, with long-term current use of insulin ([place])   . Hyponatremia   . Thrombocytopenia ([place])   . Phantom limb pain ([place])   . Postoperative pain   . Amputation above knee ([place]) 07/04/2019  . S/P right knee disarticulation amputation 06/29/2019  . Above knee amputation of right lower extremity ([place]) 06/29/2019  . Hyperglycemia   . Failed total right knee replacement ([place])   . Hepatic encephalopathy ([place]) 12/28/2018  . UTI (urinary tract infection) 12/28/2018  . Bradycardia 12/28/2018  . Hyperkalemia 12/28/2018  . Hematuria 11/23/2018  . Overweight (BMI 25.0-29.9) 11/23/2018  . Mechanical problem with extremity 11/22/2018  . Cirrhosis of liver with ascites ([place]) 11/09/2018  . Nausea without vomiting 10/26/2018  . Ascites   . Rectal bleeding   . AKI (acute kidney injury) ([place]) 10/11/2018  . Lower GI bleed 10/10/2018  . Anasarca 10/10/2018  . Hypoalbuminemia 10/10/2018  . Depression 09/28/2018  . Acute hepatic encephalopathy 09/28/2018  . Cirrhosis ([place]) 09/28/2018  . Periprosthetic fracture around internal prosthetic right knee joint 09/24/2018  . S/P right TKA 09/22/2018  . Status post total knee replacement, right 09/22/2018  . DM2 (diabetes mellitus, type 2) ([place]) 07/11/2015  . Hypertension 07/11/2015  . Hyperlipidemia 07/11/2015    Expected Discharge Date: Expected Discharge Date: 07/19/19  Team Members Present: Physician leading conference:  Dr. Delice Lesch Care Coodinator Present: Karene Fry, RN, MSN;Christina Sampson Goon, BSW;Deborah Tobin Chad, RN, BSN, CRRN Nurse Present: Debroah Loop, RN PT Present: Becky Sax, PT OT Present: Simonne Come, OT SLP Present: Stormy Fabian, SLP PPS Coordinator present : Gunnar Fusi, Novella Olive, PT     Current Status/Progress Goal Weekly Team Focus  Bowel/Bladder   continent of bowel & bladder, LBM reported was 4/13  remain continent  assist as needed   Swallow/Nutrition/ Hydration             ADL's   mod assist bathing, total assist LB dressing, min assist sitting balance for UB dressing, mod +2 for slide board transfer  Min A overall, Supervision UB dressing and grooming  ADL retraining, dynamic sitting balance, transfers, activity tolerance   Mobility   mod assist bed mobility and slide board, supervision w/c  CGA transfers, mod I w/c mobility  functional mobility/transfers, amputee education, dynamic sitting/standing balance, endurance   Communication             Safety/Cognition/ Behavioral Observations            Pain   c/o pain, phantom limb pain & numbness to RLE scale 10/10, has tylenol, norco, robaxin, & tramadol  pain scale <5/10  assess & treat as needed   Skin   has some bruising to LLE, incision RLE stump with surgical drsg & ace wrap  no new areas of skin break down, no infection to incision  assess q shift    Rehab Goals Patient on target to meet rehab goals:  Yes *See Care Plan and progress notes for long and short-term goals.     Barriers to Discharge  Current Status/Progress Possible Resolutions Date Resolved   Nursing  Weight bearing restrictions;Wound Care;Other (comments)  pain to new amputation            PT  Wound Care;Behavior  fear of falling/movement              OT                  SLP                SW     Has a hospital bed, over bed tray, ramped entrance, slideboard, w/c, walker and BSC, shower bench          Discharge  Planning/Teaching Needs:  Home with spouse and daughters and CNA 10-1p 5x/week  TBD   Team Discussion: MD DM, monitoring labs, pain, med added.  RN refusing miralax and lactulose, cont B/B.  OT min/S goals, total LB D, min UB d, mod +2 transfers, SB transfer min/mod A.  PT pending.   Revisions to Treatment Plan: N/A     Medical Summary Current Status: Impaired function, ADLs and mobility secondary to R AKA due to failed TKRs Weekly Focus/Goal: Improve mobility, CBGs, hyperammonemia, incision/phantom limb pain  Barriers to Discharge: Medical stability;Weight;Wound care;Weight bearing restrictions  Barriers to Discharge Comments: Phantom limb pain Possible Resolutions to Barriers: Therapies, optimize pain meds, optimize DM meds, follow labs, decrease ammonemia   Continued Need for Acute Rehabilitation Level of Care: The patient requires daily medical management by a physician with specialized training in physical medicine and rehabilitation for the following reasons: Direction of a multidisciplinary physical rehabilitation program to maximize functional independence : Yes Medical management of patient stability for increased activity during participation in an intensive rehabilitation regime.: Yes Analysis of laboratory values and/or radiology reports with any subsequent need for medication adjustment and/or medical intervention. : Yes   I attest that I was present, lead the team conference, and concur with the assessment and plan of the team.   Retta Diones 07/05/2019, 8:23 PM   Team conference was held via web/ teleconference due to [place] - 19

## 2019-07-05 NOTE — Plan of Care (Signed)
  Problem: RH SKIN INTEGRITY Goal: RH STG SKIN FREE OF INFECTION/BREAKDOWN Description: Patients skin will remain free from further infection or breakdown with Min assist. Outcome: Progressing Goal: RH STG MAINTAIN SKIN INTEGRITY WITH ASSISTANCE Description: STG Maintain Skin Integrity With min Assistance. Outcome: Progressing Goal: RH STG ABLE TO PERFORM INCISION/WOUND CARE W/ASSISTANCE Description: STG Able To Perform Incision/Wound Care With total Assistance from caregiver. Outcome: Progressing   Problem: RH SAFETY Goal: RH STG ADHERE TO SAFETY PRECAUTIONS W/ASSISTANCE/DEVICE Description: STG Adhere to Safety Precautions With mod I  Assistance/Device. Outcome: Progressing   Problem: RH PAIN MANAGEMENT Goal: RH STG PAIN MANAGED AT OR BELOW PT'S PAIN GOAL Description: < 4 Outcome: Progressing

## 2019-07-05 NOTE — Progress Notes (Signed)
Inpatient Rehabilitation Medication Review by a Pharmacist  A complete drug regimen review was completed for this patient to identify any potential clinically significant medication issues.  Clinically significant medication issues were identified:  yes   Type of Medication Issue Identified Description of Issue Urgent (address now) Non-Urgent (address on AM team rounds) Plan Plan Accepted by Provider? (Yes / No / Pending AM Rounds)  Drug Interaction(s) (clinically significant)       Duplicate Therapy       Allergy       No Medication Administration End Date    Xarelto 10 mg daily for DVT prophlyaxis begun on 06/30/19 (POD #1)  Ortho discharge summary 4/13 indicated plan for 14 more days of therapy.  Follow up for stop time.  pending am rounds  Incorrect Dose       Additional Drug Therapy Needed       Other         For non-urgent medication issues to be resolved on team rounds tomorrow morning a [facility] Secure Chat Handoff was sent to:    Dr. Posey Pronto   Pharmacist comments:  Thrombocytopenia, monitor for any signs of bleeding while on Xarelto for DVT prophylaxis.  Time spent performing this drug regimen review (minutes):  [address] mintues   Arty Baumgartner, [place] Phone: [phone] 07/05/2019 8:49 PM

## 2019-07-05 NOTE — Progress Notes (Signed)
Inpatient Rehabilitation  Patient information reviewed and entered into eRehab system by Ante Arredondo M. Daune Divirgilio, M.A., CCC/SLP, PPS Coordinator.  Information including medical coding, functional ability and quality indicators will be reviewed and updated through discharge.    

## 2019-07-05 NOTE — Progress Notes (Signed)
[facility] PHYSICAL MEDICINE & REHABILITATION PROGRESS NOTE  Subjective/Complaints: Patient seen sitting up in bed this morning.  She states she did not sleep well overnight due to leg pain.  She notes a component of incisional as well as phantom limb pain, confirmed and reviewed nursing notes.  She is back to begin therapies.  ROS: + Incisional and phantom limb pain.  Denies CP, shortness of breath, nausea, vomiting, diarrhea.  Objective: Vital Signs: Blood pressure 134/67, pulse 67, temperature 98.8 F (37.1 C), temperature source Oral, resp. rate 19, height 5' 5"  (1.651 m), weight 73.6 kg, SpO2 98 %. No results found. Recent Labs    07/03/19 0747 07/05/19 0602  WBC 3.5* 4.7  HGB 8.0* 8.7*  HCT 24.9* 26.7*  PLT 51* 65*   Recent Labs    07/05/19 0602  NA 134*  K 4.0  CL 100  CO2 26  GLUCOSE 157*  BUN 13  CREATININE 0.73  CALCIUM 8.2*    Physical Exam: BP 134/67 (BP Location: Right Arm)   Pulse 67   Temp 98.8 F (37.1 C) (Oral)   Resp 19   Ht 5' 5"  (1.651 m)   Wt 73.6 kg   SpO2 98%   BMI 27.00 kg/m  Constitutional: No distress . Vital signs reviewed. HENT: Normocephalic.  Atraumatic. Eyes: EOMI. No discharge. Cardiovascular: No JVD. Respiratory: Normal effort.  No stridor. GI: Non-distended. Skin: Scattered bruising Psych: Flat. Musc: Right AKA with edema and tenderness Neuro: Alert Motor: Bilateral upper extremities: 5/5 proximal distal Left lower extremity: Hip flexion, knee extension 3 -/5, ankle dorsiflexion 4+/5 Right lower extremity: Hip flexion limited by pain  Assessment/Plan: 1. Functional deficits secondary to right AKA which require 3+ hours per day of interdisciplinary therapy in a comprehensive inpatient rehab setting.  Physiatrist is providing close team supervision and 24 hour management of active medical problems listed below.  Physiatrist and rehab team continue to assess barriers to discharge/monitor patient progress toward functional  and medical goals  Care Tool:  Bathing              Bathing assist       Upper Body Dressing/Undressing Upper body dressing   What is the patient wearing?: Pull over shirt    Upper body assist      Lower Body Dressing/Undressing Lower body dressing      What is the patient wearing?: Incontinence brief, Ace wrap/stump shrinker     Lower body assist       Toileting Toileting    Toileting assist Assist for toileting: Total Assistance - Patient < 25% Assistive Device Comment: bedpan   Transfers Chair/bed transfer  Transfers assist  Chair/bed transfer activity did not occur: Safety/medical concerns        Locomotion Ambulation   Ambulation assist              Walk 10 feet activity   Assist           Walk 50 feet activity   Assist           Walk 150 feet activity   Assist           Walk 10 feet on uneven surface  activity   Assist           Wheelchair     Assist               Wheelchair 50 feet with 2 turns activity    Assist  Wheelchair 150 feet activity     Assist            Medical Problem List and Plan: 1.  Impaired function, ADLs and mobility secondary to R AKA due to failed TKRs  Begin CIR evaluations  Team conference today to discuss current and goals and coordination of care, home and environmental barriers, and discharge planning with nursing, case manager, and therapies.  2.  Antithrombotics: -DVT/anticoagulation:  Cannot do lovenox due to low platelets             -antiplatelet therapy: N/A 3. Pain Management: Tramadol 50 mg qid with robaxin/Hydrocodone prn  Phantom limb pain   Gabapentin 100 BID started on 4/13, titrate as necessary 4. Mood: LCSW to follow for evaluation and support.              -antipsychotic agents: N/A 5. Neuropsych: This patient is ?fully capable of making decisions on her own behalf. 6. Skin/Wound Care: Monitor wound for healing once  surgical dressing removed.   Plan to d/c surgical dressing ~4/14 7. Fluids/Electrolytes/Nutrition: Monitor I/Os 8. Cirrhosis of the liver: Continue Xifaxan.  Hyperammonemia   Lactulose started on 4/14 9. ABLA:   Hemoglobin 8.4 on 4/14  Continue to monitor 10. Thrombocytopenia: Monitor for signs of bleeding. Baseline at 74-->51.   Platelets 65 on 4/14   Continue to monitor 11. Chronic hyponatremia: Continue to monitor.   Sodium 134 on 4/14  Continue to monitor 12. T2DM with hyperglycemia: Hgb A1c-5.2. Continue glucotrol, trajenta, metformin and Lantus 20 units daily. Uses Lantus 20 units prn BS>180.   Monitor with increased mobility 13. H/o depression: ON Lexapro, Xanax and Wellbutrin.   LOS: 1 days A FACE TO FACE EVALUATION WAS PERFORMED  Brittany Russo Brittany Russo 07/05/2019, 8:14 AM

## 2019-07-05 NOTE — Care Management (Signed)
Patient Details  Name: Brittany Russo MRN: [id number] Date of Birth: August 26, 1945  Today's Date: 07/05/2019  Problem List:  Patient Active Problem List   Diagnosis Date Noted  . Controlled type 2 diabetes mellitus with hyperglycemia, with long-term current use of insulin ([place])   . Hyponatremia   . Thrombocytopenia ([place])   . Phantom limb pain ([place])   . Postoperative pain   . Amputation above knee ([place]) 07/04/2019  . S/P right knee disarticulation amputation 06/29/2019  . Above knee amputation of right lower extremity ([place]) 06/29/2019  . Hyperglycemia   . Failed total right knee replacement ([place])   . Hepatic encephalopathy ([place]) 12/28/2018  . UTI (urinary tract infection) 12/28/2018  . Bradycardia 12/28/2018  . Hyperkalemia 12/28/2018  . Hematuria 11/23/2018  . Overweight (BMI 25.0-29.9) 11/23/2018  . Mechanical problem with extremity 11/22/2018  . Cirrhosis of liver with ascites ([place]) 11/09/2018  . Nausea without vomiting 10/26/2018  . Ascites   . Rectal bleeding   . AKI (acute kidney injury) ([place]) 10/11/2018  . Lower GI bleed 10/10/2018  . Anasarca 10/10/2018  . Hypoalbuminemia 10/10/2018  . Depression 09/28/2018  . Acute hepatic encephalopathy 09/28/2018  . Cirrhosis ([place]) 09/28/2018  . Periprosthetic fracture around internal prosthetic right knee joint 09/24/2018  . S/P right TKA 09/22/2018  . Status post total knee replacement, right 09/22/2018  . DM2 (diabetes mellitus, type 2) ([place]) 07/11/2015  . Hypertension 07/11/2015  . Hyperlipidemia 07/11/2015   Past Medical History:  Past Medical History:  Diagnosis Date  . Acute and subacute hepatic failure without coma   . Allergy   . Anasarca   . Anxiety   . Arthritis   . Ascites 09/2018   no SBP on 850 cc tap 10/13/18  . Asthma    allery induced  . Cirrhosis of liver ([place]) ~ 2004   from NAFLD.  Dx ~ 2004.  Dr Dorcas Mcmurray at [facility]/    . Depression   . Diabetes mellitus without complication ([place])    type 2  .  Eczema of both hands   . Endometrial cancer ([place]) 1996, 2003   hysterectomy 1996.  recurrence 2003, treated with radiation.    . Generalized edema   . GI hemorrhage   . 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 in [place]  . Hyperlipidemia   . Hypertension   . Muscle weakness   . Seizures ([place])    last seizure June 07, 2014   . Thrombocytopenia ([place])   . Thyroid nodule    Past Surgical History:  Past Surgical History:  Procedure Laterality Date  . AMPUTATION Right 06/29/2019   Procedure: AMPUTATION ABOVE KNEE through knee;  Surgeon: Paralee Cancel, MD;  Location: [facility];  Service: Orthopedics;  Laterality: Right;  2 hrs  . CATARACT EXTRACTION W/PHACO Left 06/04/2017   Procedure: CATARACT EXTRACTION PHACO AND INTRAOCULAR LENS PLACEMENT (IOC);  Surgeon: Baruch Goldmann, MD;  Location: [facility];  Service: Ophthalmology;  Laterality: Left;  CDE: 3.90  . CATARACT EXTRACTION W/PHACO Right 07/15/2017   Procedure: CATARACT EXTRACTION PHACO AND INTRAOCULAR LENS PLACEMENT (IOC);  Surgeon: Baruch Goldmann, MD;  Location: [facility];  Service: Ophthalmology;  Laterality: Right;  CDE: 7.60  . COLONOSCOPY  06/2007   in [place]. for hematochzia.  radiation proctitis  . DILATION AND CURETTAGE OF UTERUS    . ESOPHAGOGASTRODUODENOSCOPY  2016  . ESOPHAGOGASTRODUODENOSCOPY (EGD) WITH PROPOFOL N/A 12/30/2018   Procedure: EGD;  Surgeon: Watt Climes,  Altamese Dilling, MD;  Location: [facility];  Service: Endoscopy;  Laterality: N/A;  . EXCISIONAL TOTAL KNEE ARTHROPLASTY Right 01/03/2019   Procedure: EXCISIONAL TOTAL KNEE ARTHROPLASTY;  Surgeon: Paralee Cancel, MD;  [facility]: [facility];  Service: Orthopedics;  Laterality: Right;  . FLEXIBLE SIGMOIDOSCOPY N/A 12/30/2018   Procedure: FLEXIBLE SIGMOIDOSCOPY;  Surgeon: Clarene Essex, MD;  Location: [facility];  Service: Endoscopy;  Laterality: N/A;  . IR PARACENTESIS  10/13/2018  . STERIOD INJECTION Left 06/29/2019   Procedure: LEFT  KNEE INJECTION;  Surgeon: Paralee Cancel, MD;  Location: [facility];  Service: Orthopedics;  Laterality: Left;  . TOTAL ABDOMINAL HYSTERECTOMY  1996  . TOTAL KNEE ARTHROPLASTY Right 09/22/2018   Procedure: TOTAL KNEE ARTHROPLASTY;  Surgeon: Paralee Cancel, MD;  Location: [facility];  Service: Orthopedics;  Laterality: Right;  70 mins  . TOTAL KNEE REVISION Right 09/27/2018   Procedure: TOTAL KNEE REVISION;  Surgeon: Paralee Cancel, MD;  Location: [facility];  Service: Orthopedics;  Laterality: Right;  . TOTAL KNEE REVISION Right 11/22/2018   Procedure: repair right knee extensor mechanism;  Surgeon: Paralee Cancel, MD;  Location: [facility];  Service: Orthopedics;  Laterality: Right;  90 mins   Social History:  reports that she has never smoked. She has never used smokeless tobacco. She reports that she does not drink alcohol or use drugs.  Family / Support Systems Marital Status: Married Patient Roles: Spouse, Parent Spouse/Significant Other: Brittany Russo Children: Daughters Anticipated Caregiver: Brittany Russo is primary contact (daughter) Ability/Limitations of Caregiver: works, pt has 24/7 at baseline between family and hired caregivers Caregiver Availability: 24/7  Social History Preferred language: English Religion: Christian Read: Yes Write: Yes   Abuse/Neglect Abuse/Neglect Assessment Can Be Completed: Yes Physical Abuse: Denies Verbal Abuse: Denies Sexual Abuse: Denies Exploitation of patient/patient's resources: Denies Self-Neglect: Denies  Emotional Status Pt's affect, behavior and adjustment status: Flat affect, normal mood and nervous behavior Recent Psychosocial Issues: Anxiety Psychiatric History: Depression taking Lexapro, Wellbutrin and Xanax  Patient / Family Perceptions, Expectations & Goals Pt/Family understanding of illness & functional limitations: Patient has a fair understanding of current illness and functional limitations Premorbid pt/family roles/activities: Family had provided  minimal assistance PTA, W/C bound for 6-8 months, using SB for transfers and had been in SNF for 6 weeks prior Anticipated changes in roles/activities/participation: May need minimal assistance at discharge, assistance with dressing changes Pt/family expectations/goals: Patient would like to be at a level her daugters and a CNA can assist  Community Resources Premorbid Home Care/DME Agencies: Passenger transport manager) Transportation available at discharge: Daughter can provide transportation at discharge Resource referrals recommended: Neuropsychology  Discharge Planning Living Arrangements: Spouse/significant other Support Systems: Spouse/significant other, Children Type of Residence: Private residence Insurance Resources: [employer], [employer] (specify county) Money Management: Spouse Does the patient have any problems obtaining your medications?: No Home Management: Spouse and daughters will manage the home Patient/Family Preliminary Plans: Return home with spouse and daughters with CNA 10-1p 5x/week Social Work Anticipated Follow Up Needs: [place] Additional Notes/Comments: Has hospital bed, over bed tray and ramped entrance to home Expected length of stay: 10 days  Clinical Impression Patient soft spoken and anxious during our conversation. Reported she was told by the MD that amputation was the best thing however she was not sure; still having pain but looking forward to getting a prosthesis so she could be more mobile. Reported daughters did a lot for her and helped her with self care as did a CNA that came 5x/week 10a-1p. She has several pieces of  DME and plans to return home with her husband and daughters at discharge. She is excited to go home in ten days however wants to be sure she gets as much therapy as she needs. Reviewed previous [facility] services and she noted she had worked with [facility] in the past.   Margarito Liner 07/05/2019, 5:03 PM

## 2019-07-05 NOTE — Evaluation (Signed)
Occupational Therapy Assessment and Plan  Patient Details  Name: Brittany Russo MRN: [id number] Date of Birth: 1945/10/16  OT Diagnosis: acute pain and muscle weakness (generalized) Rehab Potential: Rehab Potential (ACUTE ONLY): Good ELOS: 14-16 days   Today's Date: 07/05/2019 OT Individual Time: 5726-2035 OT Individual Time Calculation (min): 60 min     Problem List:  Patient Active Problem List   Diagnosis Date Noted  . Controlled type 2 diabetes mellitus with hyperglycemia, with long-term current use of insulin ([place])   . Hyponatremia   . Thrombocytopenia ([place])   . Phantom limb pain ([place])   . Postoperative pain   . Amputation above knee ([place]) 07/04/2019  . S/P right knee disarticulation amputation 06/29/2019  . Above knee amputation of right lower extremity ([place]) 06/29/2019  . Hyperglycemia   . Failed total right knee replacement ([place])   . Hepatic encephalopathy ([place]) 12/28/2018  . UTI (urinary tract infection) 12/28/2018  . Bradycardia 12/28/2018  . Hyperkalemia 12/28/2018  . Hematuria 11/23/2018  . Overweight (BMI 25.0-29.9) 11/23/2018  . Mechanical problem with extremity 11/22/2018  . Cirrhosis of liver with ascites ([place]) 11/09/2018  . Nausea without vomiting 10/26/2018  . Ascites   . Rectal bleeding   . AKI (acute kidney injury) ([place]) 10/11/2018  . Lower GI bleed 10/10/2018  . Anasarca 10/10/2018  . Hypoalbuminemia 10/10/2018  . Depression 09/28/2018  . Acute hepatic encephalopathy 09/28/2018  . Cirrhosis ([place]) 09/28/2018  . Periprosthetic fracture around internal prosthetic right knee joint 09/24/2018  . S/P right TKA 09/22/2018  . Status post total knee replacement, right 09/22/2018  . DM2 (diabetes mellitus, type 2) ([place]) 07/11/2015  . Hypertension 07/11/2015  . Hyperlipidemia 07/11/2015    Past Medical History:  Past Medical History:  Diagnosis Date  . Acute and subacute hepatic failure without coma   . Allergy   . Anasarca   . Anxiety   . Arthritis    . Ascites 09/2018   no SBP on 850 cc tap 10/13/18  . Asthma    allery induced  . Cirrhosis of liver ([place]) ~ 2004   from NAFLD.  Dx ~ 2004.  Dr Dorcas Mcmurray at [facility]/    . Depression   . Diabetes mellitus without complication ([place])    type 2  . Eczema of both hands   . Endometrial cancer ([place]) 1996, 2003   hysterectomy 1996.  recurrence 2003, treated with radiation.    . Generalized edema   . GI hemorrhage   . 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 in [place]  . Hyperlipidemia   . Hypertension   . Muscle weakness   . Seizures ([place])    last seizure June 07, 2014   . Thrombocytopenia ([place])   . Thyroid nodule    Past Surgical History:  Past Surgical History:  Procedure Laterality Date  . AMPUTATION Right 06/29/2019   Procedure: AMPUTATION ABOVE KNEE through knee;  Surgeon: Paralee Cancel, MD;  Location: [facility];  Service: Orthopedics;  Laterality: Right;  2 hrs  . CATARACT EXTRACTION W/PHACO Left 06/04/2017   Procedure: CATARACT EXTRACTION PHACO AND INTRAOCULAR LENS PLACEMENT (IOC);  Surgeon: Baruch Goldmann, MD;  Location: [facility];  Service: Ophthalmology;  Laterality: Left;  CDE: 3.90  . CATARACT EXTRACTION W/PHACO Right 07/15/2017   Procedure: CATARACT EXTRACTION PHACO AND INTRAOCULAR LENS PLACEMENT (IOC);  Surgeon: Baruch Goldmann, MD;  Location: [facility];  Service: Ophthalmology;  Laterality: Right;  CDE: 7.60  . COLONOSCOPY  06/2007   in [place]. for hematochzia.  radiation proctitis  . DILATION AND CURETTAGE OF UTERUS    . ESOPHAGOGASTRODUODENOSCOPY  2016  . ESOPHAGOGASTRODUODENOSCOPY (EGD) WITH PROPOFOL N/A 12/30/2018   Procedure: EGD;  Surgeon: Clarene Essex, MD;  Location: [facility];  Service: Endoscopy;  Laterality: N/A;  . EXCISIONAL TOTAL KNEE ARTHROPLASTY Right 01/03/2019   Procedure: EXCISIONAL TOTAL KNEE ARTHROPLASTY;  Surgeon: Paralee Cancel, MD;  Location: [facility];  Service: Orthopedics;  Laterality: Right;  .  FLEXIBLE SIGMOIDOSCOPY N/A 12/30/2018   Procedure: FLEXIBLE SIGMOIDOSCOPY;  Surgeon: Clarene Essex, MD;  Location: [facility];  Service: Endoscopy;  Laterality: N/A;  . IR PARACENTESIS  10/13/2018  . STERIOD INJECTION Left 06/29/2019   Procedure: LEFT KNEE INJECTION;  Surgeon: Paralee Cancel, MD;  Location: [facility];  Service: Orthopedics;  Laterality: Left;  . TOTAL ABDOMINAL HYSTERECTOMY  1996  . TOTAL KNEE ARTHROPLASTY Right 09/22/2018   Procedure: TOTAL KNEE ARTHROPLASTY;  Surgeon: Paralee Cancel, MD;  Location: [facility];  Service: Orthopedics;  Laterality: Right;  70 mins  . TOTAL KNEE REVISION Right 09/27/2018   Procedure: TOTAL KNEE REVISION;  Surgeon: Paralee Cancel, MD;  Location: [facility];  Service: Orthopedics;  Laterality: Right;  . TOTAL KNEE REVISION Right 11/22/2018   Procedure: repair right knee extensor mechanism;  Surgeon: Paralee Cancel, MD;  Location: [facility];  Service: Orthopedics;  Laterality: Right;  90 mins    Assessment & Plan Clinical Impression: Patient is a 74 y.o. year old female with history of asthma, T2DM, depression/anxiety, right cirrhosis of the liver (NAFLD) with acute on chronic hepatic failure, GIBs,  failed R-TKR complicated by fall with distal  Femur fracture and infection. She has had pain with functional disability affecting QOL and elected to undergo R-AKA on 06/28/19 by Dr. Alvan Dame. Post op NWB and has been limited by fatigue, pain and hypoglycemia. ABLA and thrombocytopenia being monitored. Patient is being admitted to CIR.  Patient transferred to CIR on 07/04/2019 .    Patient currently requires mod-max with basic self-care skills secondary to muscle weakness, decreased cardiorespiratoy endurance and decreased sitting balance, decreased standing balance and decreased balance strategies and pain.  Prior to hospitalization, patient could complete ADLs with Min assist - Supervision.  Patient will benefit from skilled intervention to increase independence with basic self-care  skills prior to discharge home with care partner.  Anticipate patient will require minimal physical assistance and follow up home health.  OT - End of Session Activity Tolerance: Tolerates 30+ min activity with multiple rests Endurance Deficit: Yes Endurance Deficit Description: requires frequent rest breaks OT Assessment Rehab Potential (ACUTE ONLY): Good OT Patient demonstrates impairments in the following area(s): Balance;Endurance;Motor;Pain;Safety OT Basic ADL's Functional Problem(s): Grooming;Bathing;Dressing;Toileting OT Transfers Functional Problem(s): Toilet;Tub/Shower OT Additional Impairment(s): None OT Plan OT Intensity: Minimum of 1-2 x/day, 45 to 90 minutes OT Frequency: 5 out of 7 days OT Duration/Estimated Length of Stay: 14-16 days OT Treatment/Interventions: Balance/vestibular training;Discharge planning;Community reintegration;Disease Lawyer;Functional mobility training;Neuromuscular re-education;Pain management;Patient/family education;Psychosocial support;Self Care/advanced ADL retraining;Skin care/wound managment;Splinting/orthotics;Therapeutic Activities;Therapeutic Exercise;UE/LE Strength taining/ROM;Wheelchair propulsion/positioning OT Basic Self-Care Anticipated Outcome(s): Min A OT Toileting Anticipated Outcome(s): Min A OT Bathroom Transfers Anticipated Outcome(s): Min A OT Recommendation Patient destination: Home Follow Up Recommendations: Home health OT;24 hour supervision/assistance Equipment Recommended: (drop arm BSC)   Skilled Therapeutic Intervention OT eval completed with discussion of rehab process, OT purpose, POC, ELOS, and goals.  ADL assessment completed with focus on bed mobility, sitting balance, and transfers during self-care tasks.  Bathing and  dressing completed at bed level for LB bathing and dressing requiring mod assist for LB bathing and total assist for LB dressing. Pt able to roll in bed  with supervision with use of bed rails.  Required mod assist to come to sitting at EOB and constant min assist for sitting balance in unsupported sitting during UB dressing.  Mod assist +2 for slide board transfer bed > w/c with 2nd person for safety.  Pt reports family assisted with LB bathing and dressing as well as transfers PTA.   OT Evaluation Precautions/Restrictions  Precautions Precautions: Fall Precaution Comments: R AKA, like to wear her L AFO/shoe Restrictions Weight Bearing Restrictions: Yes RLE Weight Bearing: Non weight bearing Pain Pain Assessment Pain Scale: 0-10 Pain Score: 0-No pain Home Living/Prior Functioning Home Living Family/patient expects to be discharged to:: Private residence Living Arrangements: Spouse/significant other Available Help at Discharge: Family Type of Home: Apartment Home Access: Level entry, Elevator(3rd floor) Home Layout: One level Bathroom Shower/Tub: Tub/shower unit(has tub transfer bench and 3 in 1) Bathroom Toilet: Handicapped height Bathroom Accessibility: Yes Additional Comments: pt reports she has a CNA come to assist her from 10am-1pm every day. She assists with bathing, dressing. pt reports he daughter helps as well and her husband.   Lives With: Spouse IADL History Homemaking Responsibilities: No Prior Function Level of Independence: Requires assistive device for independence, Needs assistance with ADLs, Needs assistance with tranfers Bath: Moderate Toileting: Moderate Dressing: Moderate  Able to Take Stairs?: No Driving: No ADL ADL Upper Body Bathing: Setup Where Assessed-Upper Body Bathing: Bed level Lower Body Bathing: Moderate assistance Where Assessed-Lower Body Bathing: Bed level Upper Body Dressing: Minimal assistance Where Assessed-Upper Body Dressing: Edge of bed Lower Body Dressing: Dependent Where Assessed-Lower Body Dressing: Bed level Vision Baseline Vision/History: Wears glasses Wears Glasses: Reading  only Patient Visual Report: No change from baseline Vision Assessment?: No apparent visual deficits Perception  Perception: Within Functional Limits Praxis Praxis: Intact Cognition Overall Cognitive Status: Within Functional Limits for tasks assessed Arousal/Alertness: Awake/alert Orientation Level: Person;Place;Situation Person: Oriented Place: Oriented Situation: Oriented Year: 2021 Month: April Day of Week: Correct Memory: Appears intact Immediate Memory Recall: Sock;Blue;Bed Memory Recall Sock: Not able to recall(initially reporting "soft" but when given cue unable to name sock) Memory Recall Blue: Without Cue Memory Recall Bed: Without Cue Attention: Selective Selective Attention: Appears intact Awareness: Appears intact Problem Solving: Appears intact Safety/Judgment: Appears intact Sensation Sensation Light Touch: Impaired Detail Peripheral sensation comments: numbness in Rt residual limb, mid-thigh and distal Light Touch Impaired Details: Impaired RLE Coordination Gross Motor Movements are Fluid and Coordinated: No Fine Motor Movements are Fluid and Coordinated: Yes Mobility  Bed Mobility Bed Mobility: Rolling Right;Rolling Left;Supine to Sit Rolling Right: Supervision/verbal cueing Rolling Left: Supervision/Verbal cueing Supine to Sit: Moderate Assistance - Patient 50-74%  Extremity/Trunk Assessment RUE Assessment RUE Assessment: Within Functional Limits LUE Assessment LUE Assessment: Within Functional Limits     Refer to Care Plan for Long Term Goals  Recommendations for other services: None    Discharge Criteria: Patient will be discharged from OT if patient refuses treatment 3 consecutive times without medical reason, if treatment goals not met, if there is a change in medical status, if patient makes no progress towards goals or if patient is discharged from hospital.  The above assessment, treatment plan, treatment alternatives and goals were  discussed and mutually agreed upon: by patient  Simonne Come 07/05/2019, 8:51 AM

## 2019-07-05 NOTE — Care Management (Signed)
  [facility] Individual Statement of Services  Patient Name:  Brittany Russo  Date:  07/05/2019  Welcome to the [facility].  Our goal is to provide you with an individualized program based on your diagnosis and situation, designed to meet your specific needs.  With this comprehensive rehabilitation program, you will be expected to participate in at least 3 hours of rehabilitation therapies Monday-Friday, with modified therapy programming on the weekends.  Your rehabilitation program will include the following services:  Physical Therapy (PT), Occupational Therapy (OT), 24 hour per day rehabilitation nursing, Therapeutic Recreations (TR), Neuropsychology, Case Management (Social Worker), Rehabilitation Medicine, Nutrition Services and Pharmacy Services  Weekly team conferences will be held on Wednesdays to discuss your progress.  Your Social Industrial/product designer will talk with you frequently to get your input and to update you on team discussions.  Team conferences with you and your family in attendance may also be held.  Expected length of stay: 10 days Overall anticipated outcome: Minimal Assist  Depending on your progress and recovery, your program may change. Your Social Industrial/product designer will coordinate services and will keep you informed of any changes. Your Social Worker's/Care Manager's name and contact numbers are listed  below.  The following services may also be recommended but are not provided by the [facility]:    [facility] will be made to provide these services after discharge if needed.  Arrangements include referral to agencies that provide these services.  Your insurance has been verified to be:  Medicare/Medicaid/Cigna Your primary doctor is:  Clemmie Krill, PA-C  Pertinent information will be shared with your doctor and your  insurance company.  Care Manager/Social Worker: Dorien Chihuahua, RN [phone] or ([phone]  Information discussed with and copy given to patient by: Margarito Liner, 07/05/2019, 4:50 PM

## 2019-07-05 NOTE — Progress Notes (Signed)
Patient was noted in bed at the beginning of the shift with her daughter at bedside. Daughter was approved to stay over tonight as per report. Patient c/o pain 2 times during the shift, one time c/o escrutiating pain. The first time, she was given tylenol along with her robaxin. Her daughter stated that this worked on the other floor. Patient called again for pain that was unbearable & was given her prn norco. About an hour later, it was almost time to give her scheduled tramadol & she stated that she was still hurting. Her right AKA stump was the area that hurt. When asked to lift it earlier, she was only able to do it slightly, enough to be able to see the marked old drainage on the compression wrap. She was then given her tramadol & an ice pack. This morning, she seems well rested with a pain scale of 1.Her scheduled tramadol was given. No acute distress noted. Will pass on to oncoming nurse.

## 2019-07-05 NOTE — Evaluation (Signed)
Physical Therapy Assessment and Plan  Patient Details  Name: Brittany Russo MRN: [id number] Date of Birth: 02-14-1946  PT Diagnosis: Difficulty walking, Impaired sensation and Muscle weakness Rehab Potential: Good ELOS: 10-14 days   Today's Date: 07/05/2019 PT Individual Time: 7793-9030 PT Individual Time Calculation (min): 53 min    Problem List:  Patient Active Problem List   Diagnosis Date Noted  . Controlled type 2 diabetes mellitus with hyperglycemia, with long-term current use of insulin ([place])   . Hyponatremia   . Thrombocytopenia ([place])   . Phantom limb pain ([place])   . Postoperative pain   . Amputation above knee ([place]) 07/04/2019  . S/P right knee disarticulation amputation 06/29/2019  . Above knee amputation of right lower extremity ([place]) 06/29/2019  . Hyperglycemia   . Failed total right knee replacement ([place])   . Hepatic encephalopathy ([place]) 12/28/2018  . UTI (urinary tract infection) 12/28/2018  . Bradycardia 12/28/2018  . Hyperkalemia 12/28/2018  . Hematuria 11/23/2018  . Overweight (BMI 25.0-29.9) 11/23/2018  . Mechanical problem with extremity 11/22/2018  . Cirrhosis of liver with ascites ([place]) 11/09/2018  . Nausea without vomiting 10/26/2018  . Ascites   . Rectal bleeding   . AKI (acute kidney injury) ([place]) 10/11/2018  . Lower GI bleed 10/10/2018  . Anasarca 10/10/2018  . Hypoalbuminemia 10/10/2018  . Depression 09/28/2018  . Acute hepatic encephalopathy 09/28/2018  . Cirrhosis ([place]) 09/28/2018  . Periprosthetic fracture around internal prosthetic right knee joint 09/24/2018  . S/P right TKA 09/22/2018  . Status post total knee replacement, right 09/22/2018  . DM2 (diabetes mellitus, type 2) ([place]) 07/11/2015  . Hypertension 07/11/2015  . Hyperlipidemia 07/11/2015    Past Medical History:  Past Medical History:  Diagnosis Date  . Acute and subacute hepatic failure without coma   . Allergy   . Anasarca   . Anxiety   . Arthritis   . Ascites 09/2018    no SBP on 850 cc tap 10/13/18  . Asthma    allery induced  . Cirrhosis of liver ([place]) ~ 2004   from NAFLD.  Dx ~ 2004.  Dr Dorcas Mcmurray at [facility]/    . Depression   . Diabetes mellitus without complication ([place])    type 2  . Eczema of both hands   . Endometrial cancer ([place]) 1996, 2003   hysterectomy 1996.  recurrence 2003, treated with radiation.    . Generalized edema   . GI hemorrhage   . 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 in [place]  . Hyperlipidemia   . Hypertension   . Muscle weakness   . Seizures ([place])    last seizure June 07, 2014   . Thrombocytopenia ([place])   . Thyroid nodule    Past Surgical History:  Past Surgical History:  Procedure Laterality Date  . AMPUTATION Right 06/29/2019   Procedure: AMPUTATION ABOVE KNEE through knee;  Surgeon: Paralee Cancel, MD;  Location: [facility];  Service: Orthopedics;  Laterality: Right;  2 hrs  . CATARACT EXTRACTION W/PHACO Left 06/04/2017   Procedure: CATARACT EXTRACTION PHACO AND INTRAOCULAR LENS PLACEMENT (IOC);  Surgeon: Baruch Goldmann, MD;  Location: [facility];  Service: Ophthalmology;  Laterality: Left;  CDE: 3.90  . CATARACT EXTRACTION W/PHACO Right 07/15/2017   Procedure: CATARACT EXTRACTION PHACO AND INTRAOCULAR LENS PLACEMENT (IOC);  Surgeon: Baruch Goldmann, MD;  Location: [facility];  Service: Ophthalmology;  Laterality: Right;  CDE: 7.60  . COLONOSCOPY  06/2007  in [place]. for hematochzia.  radiation proctitis  . DILATION AND CURETTAGE OF UTERUS    . ESOPHAGOGASTRODUODENOSCOPY  2016  . ESOPHAGOGASTRODUODENOSCOPY (EGD) WITH PROPOFOL N/A 12/30/2018   Procedure: EGD;  Surgeon: Clarene Essex, MD;  [facility]: [facility];  Service: Endoscopy;  Laterality: N/A;  . EXCISIONAL TOTAL KNEE ARTHROPLASTY Right 01/03/2019   Procedure: EXCISIONAL TOTAL KNEE ARTHROPLASTY;  Surgeon: Paralee Cancel, MD;  [facility]: [facility];  Service: Orthopedics;  Laterality: Right;  . FLEXIBLE SIGMOIDOSCOPY N/A  12/30/2018   Procedure: FLEXIBLE SIGMOIDOSCOPY;  Surgeon: Clarene Essex, MD;  [facility];  Service: Endoscopy;  Laterality: N/A;  . IR PARACENTESIS  10/13/2018  . STERIOD INJECTION Left 06/29/2019   Procedure: LEFT KNEE INJECTION;  Surgeon: Paralee Cancel, MD;  [facility];  Service: Orthopedics;  Laterality: Left;  . TOTAL ABDOMINAL HYSTERECTOMY  1996  . TOTAL KNEE ARTHROPLASTY Right 09/22/2018   Procedure: TOTAL KNEE ARTHROPLASTY;  Surgeon: Paralee Cancel, MD;  [facility];  Service: Orthopedics;  Laterality: Right;  70 mins  . TOTAL KNEE REVISION Right 09/27/2018   Procedure: TOTAL KNEE REVISION;  Surgeon: Paralee Cancel, MD;  [facility];  Service: Orthopedics;  Laterality: Right;  . TOTAL KNEE REVISION Right 11/22/2018   Procedure: repair right knee extensor mechanism;  Surgeon: Paralee Cancel, MD;  [facility]: [facility];  Service: Orthopedics;  Laterality: Right;  90 mins    Assessment & Plan Clinical Impression: Patient is a 74 y/o female with PMH of cirrhosis, ascites, depression, HTN, and DM2 admitted to [facility] on 06/29/2019 for R AKA. Pt underwent R TKA on 09/22/2018 per Dr. Alvan Dame, and fell after leaving the hospital on 7/4, sustaining a R distal femur periprosthetic fracture. She underwent multiple surgical and non-surgical attempts to salvage the knee, and ultimately opted for a R AKA, per Dr. Alvan Dame on 06/29/2019. Post op course pain management. Therapy evaluations were completed and pt was recommended for CIR. Patient transferred to CIR on 07/04/2019 .   Patient currently requires mod with mobility secondary to muscle weakness and muscle joint tightness, decreased cardiorespiratoy endurance and decreased sitting balance and decreased balance strategies.  Prior to hospitalization, patient was supervision-min assist w/ slide board transfers, mod/i with w/c mobility and lived with Spouse in a [place] home.  Home access is  Level entry, Elevator(lives on 3rd floor but able  to take elevator).  Patient will benefit from skilled PT intervention to maximize safe functional mobility, minimize fall risk and decrease caregiver burden for planned discharge home with 24 hour assist.  Anticipate patient will benefit from follow up [facility] at discharge.  PT - End of Session Activity Tolerance: Tolerates < 10 min activity, no significant change in vital signs Endurance Deficit: Yes Endurance Deficit Description: requires frequent rest breaks PT Assessment Rehab Potential (ACUTE/IP ONLY): Good PT Barriers to Discharge: Wound Care;Behavior PT Barriers to Discharge Comments: fear of falling/movement PT Patient demonstrates impairments in the following area(s): Behavior;Endurance;Balance;Pain;Safety;Sensory;Motor;Skin Integrity PT Transfers Functional Problem(s): Bed Mobility;Bed to Chair;Car;Furniture;Floor PT Locomotion Functional Problem(s): Wheelchair Mobility PT Plan PT Intensity: Minimum of 1-2 x/day ,45 to 90 minutes PT Frequency: 5 out of 7 days PT Duration Estimated Length of Stay: 10-14 days PT Treatment/Interventions: Ambulation/gait training;Balance/vestibular training;Community reintegration;Discharge planning;Disease management/prevention;DME/adaptive equipment instruction;Neuromuscular re-education;Functional mobility training;Pain management;Patient/family education;Skin care/wound management;Psychosocial support;Splinting/orthotics;Therapeutic Exercise;Therapeutic Activities;UE/LE Strength taining/ROM;Wheelchair propulsion/positioning PT Transfers Anticipated Outcome(s): CGA PT Locomotion Anticipated Outcome(s): mod/i household w/c mobility PT Recommendation Recommendations for Other Services: Neuropsych consult Follow Up Recommendations: Home health PT Patient destination: Home  Equipment Recommended: To be determined Equipment Details: has w/c and slide board already  Skilled Therapeutic Intervention  Pt received in w/c and agreeable to therapy, she denies  pain at rest but then grimaces w/ all active R residual limb movements during session. She denies any intervention. Pt performed w/c mobility throughout unit as detailed below. Performed slide board transfer to/form mat w/ mod assist and max multimodal cues for technique, sequencing, and anterior weight shifting, head/hips relationship, and BUE/LLE placement. Performed mobility as detailed below on mat, limited in supine tolerance as pt does not typically lay in full supine at home. Pt also requires min assist for dynamic sitting balance 2/2 fear or falling and posterior lean bias. Upon returning to chair, pt shaking and visibly anxious 2/2 fear of falling. Therapist providing max reassurance and encouragement. Anxiety and shaking decreases once back in w/c. Introduced residual limb management techniques including desensitization and wrapping, discussed reading through [employer], and preparing for future prosthetic use potentially. Pt appreciative and states she will use desensitization techniques for pain management throughout the day today. Instructed pt in results of PT evaluation as detailed below, PT POC, rehab potential, rehab goals, and discharge recommendations. Ended session in w/c, all needs in reach.  Briefly prepped pt on attempting car transfer this afternoon, pt w/ increased anxiety as she has not performed car transfer since July of 2020. She is agreeable to attempt.   PT Evaluation Precautions/Restrictions Precautions Precautions: Fall Precaution Comments: R AKA, like to wear her L AFO/shoe Restrictions Weight Bearing Restrictions: Yes RLE Weight Bearing: Non weight bearing Pain Pain Assessment Pain Scale: 0-10 Pain Score: 0-No pain Home Living/Prior Functioning Home Living Available Help at Discharge: Family Type of Home: Apartment Home Access: Level entry;Elevator(lives on 3rd floor but able to take elevator) Home Layout: One level Additional Comments: pt  reports she has a CNA come to assist her from 10am-1pm every day. She assists with bathing, dressing. pt reports he daughter helps as well and her husband.   Lives With: Spouse Prior Function Level of Independence: Requires assistive device for independence;Needs assistance with ADLs;Needs assistance with tranfers  Able to Take Stairs?: No Driving: No Comments: Pt has been non-ambulatory since July of 2020 after R TKA, needed supervision-min assist w/ slide board transfers. She did not slide board to/from car, used paid handicap transportation. She did practice sit<>stands to RW w/ therapy prior to admisison, but only reports "it did not go well". Cognition Overall Cognitive Status: Within Functional Limits for tasks assessed Arousal/Alertness: Awake/alert Orientation Level: Oriented X4 Attention: Selective Selective Attention: Appears intact Memory: Appears intact Immediate Memory Recall: Sock;Blue;Bed Memory Recall Sock: Not able to recall(initially reporting "soft" but when given cue unable to name sock) Memory Recall Blue: Without Cue Memory Recall Bed: Without Cue Awareness: Appears intact Problem Solving: Appears intact Safety/Judgment: Appears intact Sensation Sensation Light Touch: Impaired Detail Peripheral sensation comments: numbness in Rt residual limb, mid-thigh and distal Light Touch Impaired Details: Impaired RLE Coordination Gross Motor Movements are Fluid and Coordinated: No Fine Motor Movements are Fluid and Coordinated: Yes Coordination and Movement Description: new R AKA Motor  Motor Motor: Within Functional Limits;Other (comment) Motor - Skilled Clinical Observations: generalized weakness, L foot drop (she reports is baseline) and wears LAFO, pt unable to state why she has foot drop  Mobility Bed Mobility Bed Mobility: Rolling Right;Rolling Left;Supine to Sit;Sit to Supine Rolling Right: Minimal Assistance - Patient > 75% Rolling Left: Minimal Assistance -  Patient > 75% Supine  to Sit: Moderate Assistance - Patient 50-74% Sit to Supine: Moderate Assistance - Patient 50-74% Transfers Transfers: Lateral/Scoot Transfers Lateral/Scoot Transfers: Moderate Assistance - Patient 50-74% Transfer (Assistive device): Other (Comment)(slide board) Locomotion  Gait Ambulation: No Gait Gait: No Stairs / Additional Locomotion Stairs: No Wheelchair Mobility Wheelchair Mobility: Yes Wheelchair Assistance: Chartered loss adjuster: Both upper extremities Wheelchair Parts Management: Needs assistance Distance: 150'  Trunk/Postural Assessment  Cervical Assessment Cervical Assessment: Within Functional Limits Thoracic Assessment Thoracic Assessment: Within Functional Limits Lumbar Assessment Lumbar Assessment: Exceptions to WFL(posterior pelvic tilt, increased lumbar flexion) Postural Control Postural Control: Deficits on evaluation(posterior lean bias, suspect 2/2 fear of falling)  Balance Balance Balance Assessed: Yes Static Sitting Balance Static Sitting - Level of Assistance: 5: Stand by assistance Dynamic Sitting Balance Dynamic Sitting - Level of Assistance: 4: Min assist Extremity Assessment  RLE Assessment RLE Assessment: Exceptions to WFL(new R AKA) Passive Range of Motion (PROM) Comments: hip ROM limited 0-90 deg flexion 2/2 tightness and muscle guarding 2/2 pain General Strength Comments: unable to tolerate formal strength testing 2/2 pain LLE Assessment LLE Assessment: Exceptions to WFL(pt w/ L foot drop at baseline) General Strength Comments: 3+ to 4/5 globally    Refer to Care Plan for Long Term Goals  Recommendations for other services: Neuropsych  Discharge Criteria: Patient will be discharged from PT if patient refuses treatment 3 consecutive times without medical reason, if treatment goals not met, if there is a change in medical status, if patient makes no progress towards goals or if patient is  discharged from hospital.  The above assessment, treatment plan, treatment alternatives and goals were discussed and mutually agreed upon: by patient  Lumina Gitto K Clemetine Marker 07/05/2019, 12:11 PM

## 2019-07-06 ENCOUNTER — Inpatient Hospital Stay (HOSPITAL_COMMUNITY): Payer: 59 | Admitting: Occupational Therapy

## 2019-07-06 ENCOUNTER — Inpatient Hospital Stay (HOSPITAL_COMMUNITY): Payer: 59 | Admitting: Physical Therapy

## 2019-07-06 ENCOUNTER — Inpatient Hospital Stay (HOSPITAL_COMMUNITY): Payer: 59

## 2019-07-06 LAB — GLUCOSE, CAPILLARY
Glucose-Capillary: 38 mg/dL — CL (ref 70–99)
Glucose-Capillary: 147 mg/dL — ABNORMAL HIGH (ref 70–99)
Glucose-Capillary: 93 mg/dL (ref 70–99)
Glucose-Capillary: 141 mg/dL — ABNORMAL HIGH (ref 70–99)
Glucose-Capillary: 155 mg/dL — ABNORMAL HIGH (ref 70–99)

## 2019-07-06 MED ORDER — GABAPENTIN 300 MG PO CAPS
300.0000 mg | ORAL_CAPSULE | Freq: Every day | ORAL | Status: DC
  Administered 2019-07-07 – 2019-07-09 (×3): 300 mg via ORAL
  Filled 2019-07-06 (×3): qty 1

## 2019-07-06 NOTE — Progress Notes (Signed)
Team Conference Report to Patient/Family  Team Conference discussion was reviewed with the patient, including goals, any changes in plan of care and target discharge date.  Patient expressed understanding and is in agreement.  The patient has a target discharge date of 10-14 days with clarification of how much assistance family was providing before admission. Patient reported family was providing minimal assist PTA and using SB for transfers.Patient has a CAP service 10-1p 5x/week and daughter assists when husband is not available. Patient has several pieces of DME and was linked with [facility] services PTA.  Dorien Chihuahua B 07/06/2019, 8:33 AM

## 2019-07-06 NOTE — Progress Notes (Signed)
Physical Therapy Session Note  Patient Details  Name: Brittany Russo MRN: [id number] Date of Birth: 1945/10/27  Today's Date: 07/06/2019 PT Individual Time: 1000-1110 PT Individual Time Calculation (min): 70 min   Short Term Goals: Week 1:  PT Short Term Goal 1 (Week 1): Pt will initiate standing training PT Short Term Goal 2 (Week 1): Pt will transfer bed<>chair w/ min assist PT Short Term Goal 3 (Week 1): Pt will perform bed mobility w/ min assist PT Short Term Goal 4 (Week 1): Pt will maintain dynamic sitting balance w/ supervision  Skilled Therapeutic Interventions/Progress Updates:   Pt received in w/c and agreeable to therapy, pain 6/10 in R residual limb (premedicated). Pt self-propelled w/c to/from therapy gym w/ supervision using BUEs. Mod assist slide board to EOB w/ verbal, tactile, and manual cues for anterior trunk lean, weight shifting, head/hips relationship, and UE/LE placement. Mod assist bed mobility and performed R AKA strengthening exercises in supine as listed below. Tactile and verbal cues throughout for technique and muscle isolation. Pt reports pain decreasing to 4/10 w/ therex. Pt spent ~30 min in sidelying/supine to also work on stretching anterior musculature. Discussed importance of supine stretching to maintain upright posture once pt does have prosthesis and begins to work on standing and gait training. Worked on dynamic sitting balance w/ emphasis on anterior trunk lean, trunk rotation, and weight shifting. Pt picked up horseshoes from floor and from far lateral sides of mat, 8 reps x3 bouts. Max encouragement for pt to trust her body to keep her up and not fall from mat. Therapist providing guarding in front, no physical assist needed. Performed anterior trunk leans while unweighting bottom from mat, 2x5 reps. Mod assist to facilitate movement.  R AKA strengthening: -abduction slides 3x5 -SLR 4x5 -isometric hip extension 3x10 -adduction squeeze 3x10  -sidelying  hip extension 2x10   Mod assist transfer back to w/c via slide board, minimal carryover of anterior weight shifting strategies. Pt self-propelled w/c back to room, ended session in w/c, all needs in reach. Provided pt w/ written handout of exercises she can perform in seated w/ R AKA for pain relief including hip flexion, hip abduction, and hip adduction. Pt appreciative.   Therapy Documentation Precautions:  Precautions Precautions: Fall Precaution Comments: R AKA, like to wear her L AFO/shoe Restrictions Weight Bearing Restrictions: Yes RLE Weight Bearing: Non weight bearing  Therapy/Group: Individual Therapy  Milan Perkins K Aracelli Woloszyn 07/06/2019, 11:14 AM

## 2019-07-06 NOTE — Progress Notes (Signed)
[place] PHYSICAL MEDICINE & REHABILITATION PROGRESS NOTE  Subjective/Complaints: Patient seen sitting up in bed this morning.  She states she slept fairly overnight due to phantom limb pain.  She states had good first day of therapies yesterday.  Discussed dressings with nursing.  ROS: + Phantom limb pain.  Denies CP, shortness of breath, nausea, vomiting, diarrhea.  Objective: Vital Signs: Blood pressure (!) 145/75, pulse 79, temperature 98.3 F (36.8 C), temperature source Oral, resp. rate 20, height 5' 5"  (1.651 m), weight 73.6 kg, SpO2 97 %. No results found. Recent Labs    07/05/19 0602  WBC 4.7  HGB 8.7*  HCT 26.7*  PLT 65*   Recent Labs    07/05/19 0602  NA 134*  K 4.0  CL 100  CO2 26  GLUCOSE 157*  BUN 13  CREATININE 0.73  CALCIUM 8.2*    Physical Exam: BP (!) 145/75 (BP Location: Left Arm)   Pulse 79   Temp 98.3 F (36.8 C) (Oral)   Resp 20   Ht 5' 5"  (1.651 m)   Wt 73.6 kg   SpO2 97%   BMI 27.00 kg/m  Constitutional: No distress . Vital signs reviewed. HENT: Normocephalic.  Atraumatic. Eyes: EOMI. No discharge. Cardiovascular: No JVD. Respiratory: Normal effort.  No stridor. GI: Non-distended. Skin: Scattered bruising. AKA with staples and serosanguineous drainage. Psych: Flat, but improved since yesterday Musc: Right AKA with edema and tenderness, midline indentation from old TKA procedure Neuro: Alert Motor:  Left lower extremity: Hip flexion, knee extension 3 -/5, ankle dorsiflexion 4+/5 Right lower extremity: Hip flexion 4/5 (some pain inhibition)  Assessment/Plan: 1. Functional deficits secondary to right AKA which require 3+ hours per day of interdisciplinary therapy in a comprehensive inpatient rehab setting.  Physiatrist is providing close team supervision and 24 hour management of active medical problems listed below.  Physiatrist and rehab team continue to assess barriers to discharge/monitor patient progress toward functional  and medical goals  Care Tool:  Bathing    Body parts bathed by patient: Right arm, Left arm, Chest, Abdomen, Front perineal area, Right upper leg, Left upper leg   Body parts bathed by helper: Buttocks, Left lower leg Body parts n/a: Right lower leg(Rt AKA)   Bathing assist Assist Level: Minimal Assistance - Patient > 75%(bed level)     Upper Body Dressing/Undressing Upper body dressing   What is the patient wearing?: Pull over shirt    Upper body assist Assist Level: Minimal Assistance - Patient > 75%    Lower Body Dressing/Undressing Lower body dressing      What is the patient wearing?: Pants     Lower body assist Assist for lower body dressing: Moderate Assistance - Patient 50 - 74%     Toileting Toileting    Toileting assist Assist for toileting: Total Assistance - Patient < 25% Assistive Device Comment: (Bedpan)   Transfers Chair/bed transfer  Transfers assist  Chair/bed transfer activity did not occur: Safety/medical concerns  Chair/bed transfer assist level: Moderate Assistance - Patient 50 - 74%     Locomotion Ambulation   Ambulation assist   Ambulation activity did not occur: Safety/medical concerns          Walk 10 feet activity   Assist  Walk 10 feet activity did not occur: Safety/medical concerns        Walk 50 feet activity   Assist Walk 50 feet with 2 turns activity did not occur: Safety/medical concerns  Walk 150 feet activity   Assist Walk 150 feet activity did not occur: Safety/medical concerns         Walk 10 feet on uneven surface  activity   Assist Walk 10 feet on uneven surfaces activity did not occur: Safety/medical concerns         Wheelchair     Assist Will patient use wheelchair at discharge?: Yes Type of Wheelchair: Manual    Wheelchair assist level: Supervision/Verbal cueing Max wheelchair distance: 150'    Wheelchair 50 feet with 2 turns activity    Assist         Assist Level: Supervision/Verbal cueing   Wheelchair 150 feet activity     Assist     Assist Level: Supervision/Verbal cueing      Medical Problem List and Plan: 1.  Impaired function, ADLs and mobility secondary to R AKA due to failed TKRs  Continue CIR 2.  Antithrombotics: -DVT/anticoagulation:  Cannot do lovenox due to low platelets             -antiplatelet therapy: N/A 3. Pain Management: Tramadol 50 mg qid with robaxin/Hydrocodone prn  Phantom limb pain   Gabapentin 100 BID started on 4/13, changed to 300 mg nightly on 4/15 4. Mood: LCSW to follow for evaluation and support.              -antipsychotic agents: N/A 5. Neuropsych: This patient is ?fully capable of making decisions on her own behalf. 6. Skin/Wound Care: Monitor wound for healing once surgical dressing removed.   ABD pad, with Kerlix and Ace dressings to AKA. 7. Fluids/Electrolytes/Nutrition: Monitor I/Os 8. Cirrhosis of the liver: Continue Xifaxan.  Hyperammonemia   Lactulose started on 4/14-patient refusing, despite education 9. ABLA:   Hemoglobin 8.7 on 4/14, labs ordered for tomorrow  Continue to monitor 10. Thrombocytopenia: Monitor for signs of bleeding. Baseline at 74-->51.   Platelets 65 on 4/14, labs ordered for tomorrow  Continue to monitor 11. Chronic hyponatremia: Continue to monitor.   Sodium 134 on 4/14  Continue to monitor 12. T2DM with hyperglycemia: Hgb A1c-5.2. Continue glucotrol, trajenta, metformin and Lantus 20 units daily. Uses Lantus 20 units prn BS>180.   Labile on 4/15  Monitor with increased mobility 13. H/o depression: ON Lexapro, Xanax and Wellbutrin.   LOS: 2 days A FACE TO FACE EVALUATION WAS PERFORMED  Fatmata Legere Lorie Phenix 07/06/2019, 9:00 AM

## 2019-07-06 NOTE — Progress Notes (Signed)
Physical Therapy Session Note  Patient Details  Name: Brittany Russo MRN: [id number] Date of Birth: 1945/09/07  Today's Date: 07/06/2019 PT Individual Time: 0800-0855 PT Individual Time Calculation (min): 55 min   Short Term Goals: Week 1:  PT Short Term Goal 1 (Week 1): Pt will initiate standing training PT Short Term Goal 2 (Week 1): Pt will transfer bed<>chair w/ min assist PT Short Term Goal 3 (Week 1): Pt will perform bed mobility w/ min assist PT Short Term Goal 4 (Week 1): Pt will maintain dynamic sitting balance w/ supervision  Skilled Therapeutic Interventions/Progress Updates:   Received pt supine in bed, pt agreeable to therapy, and reported pain 2/10 in R residual limb. RN present to administer medication and therapist provided rest breaks and distractions to reduce pain levels. Session focused on dressing, functional mobility/transfers, UE/LE strength, amputee education, dynamic sitting balance, and improved activity tolerance. Pt reported RN was coming in to inspect limb. Therapist inspected limb and noted bleeding from posterior side of staples. RN notified and therapist assisted with cleaning pt. Therapist wrapped R residual limb in gauze and ace wrap total assist. Pt doffed dirty gown with min A and donned pants in supine with mod A. Pt rolled to L and R with min A and use of bedrails and required mod A to pull pants over hips. Therapist donned ted hose total assist with pt in supine. Pt transferred supine<>sitting EOB with HOB elevated and use of bedrails with mod A. Pt initially shaking and with increased anxiety upon sitting EOB and requiring CGA/min A for static sitting balance. Therapist provided emotional support and increased time for pt to calm down. Doffed pull over shirt with min A and donned L brace/AFO total assist. RN present to administer pain medication. Pt transferred bed<>WC via slideboard mod A. Pt required verbal cues for anterior weight shifting, head/hips  relationship, and hand placement on board. Pt with increased fear of falling with transfers. Pt brushed hair seated in WC at sink with set up assist. Pt transported to ortho gym in [facility] total assist for time management purposes. Pt performed bilateral UE strengthening on UBE at level 1.5 for 3 minutes forward and 3 minutes backwards with supervision. Pt required multiple rest breaks throughout session due to increased fatigue. Pt transported back to room in [facility] total assist. Concluded session with pt sitting in WC, needs within reach, and seatbelt alarm on.   Therapy Documentation Precautions:  Precautions Precautions: Fall Precaution Comments: R AKA, like to wear her L AFO/shoe Restrictions Weight Bearing Restrictions: Yes RLE Weight Bearing: Non weight bearing  Therapy/Group: Individual Therapy Alfonse Alpers PT, DPT   07/06/2019, 7:27 AM

## 2019-07-06 NOTE — Progress Notes (Signed)
Occupational Therapy Session Note  Patient Details  Name: Brittany Russo MRN: [id number] Date of Birth: 1946-02-21  Today's Date: 07/06/2019 OT Individual Time: 4445-8483 OT Individual Time Calculation (min): 57 min    Short Term Goals: Week 1:  OT Short Term Goal 1 (Week 1): Pt will complete toilet transfers to drop arm BSC with mod assist OT Short Term Goal 2 (Week 1): Pt will complete LB dressing with mod assist with lateral leans vs sit > stand OT Short Term Goal 3 (Week 1): Pt will complete toileting tasks with mod assist  Skilled Therapeutic Interventions/Progress Updates:  Patient met seated in wc in agreement with OT treatment session with focus on self-care re-education and therapeutic activity as detailed below. Pain 2/10 at rest and with activity in R residual limb. Patient indicating need to void. Patient able to manage wc parts to unlock breaks and self-propel wc into bathroom with assistance for managing incline. Patient completed toilet transfer to [facility] over toilet with Max A and use of grab bar. OT noted increased bleeding through ace bandage and curlex on R residual limb. RN notified. Patient returned to supine with use of SB and Mod A with cueing for hand placement and head/hip relationship. Rolling R<>L with S-Min A in supine to don LB clothing with Mod A. Session concluded with patient lying supine in bed with call bell within reach, bed alarm activated, and all needs met.   Therapy Documentation Precautions:  Precautions Precautions: Fall Precaution Comments: R AKA, like to wear her L AFO/shoe Restrictions Weight Bearing Restrictions: Yes RLE Weight Bearing: Non weight bearing General:    Therapy/Group: Individual Therapy  [name] 07/06/2019, 1:20 PM

## 2019-07-07 ENCOUNTER — Inpatient Hospital Stay (HOSPITAL_COMMUNITY): Payer: 59

## 2019-07-07 ENCOUNTER — Inpatient Hospital Stay (HOSPITAL_COMMUNITY): Payer: 59 | Admitting: Occupational Therapy

## 2019-07-07 ENCOUNTER — Encounter (HOSPITAL_COMMUNITY): Payer: 59 | Admitting: Psychology

## 2019-07-07 DIAGNOSIS — F320 Major depressive disorder, single episode, mild: Secondary | ICD-10-CM

## 2019-07-07 DIAGNOSIS — F411 Generalized anxiety disorder: Secondary | ICD-10-CM

## 2019-07-07 DIAGNOSIS — R0989 Other specified symptoms and signs involving the circulatory and respiratory systems: Secondary | ICD-10-CM | POA: Insufficient documentation

## 2019-07-07 DIAGNOSIS — R7309 Other abnormal glucose: Secondary | ICD-10-CM | POA: Insufficient documentation

## 2019-07-07 LAB — CBC WITH DIFFERENTIAL/PLATELET
Abs Immature Granulocytes: 0.03 10*3/uL (ref 0.00–0.07)
Basophils Absolute: 0 10*3/uL (ref 0.0–0.1)
Basophils Relative: 0 %
Eosinophils Absolute: 0.2 10*3/uL (ref 0.0–0.5)
Eosinophils Relative: 5 %
HCT: 28.2 % — ABNORMAL LOW (ref 36.0–46.0)
Hemoglobin: 8.4 g/dL — ABNORMAL LOW (ref 12.0–15.0)
Immature Granulocytes: 1 %
Lymphocytes Relative: 13 %
Lymphs Abs: 0.6 10*3/uL — ABNORMAL LOW (ref 0.7–4.0)
MCH: 28.8 pg (ref 26.0–34.0)
MCHC: 29.8 g/dL — ABNORMAL LOW (ref 30.0–36.0)
MCV: 96.6 fL (ref 80.0–100.0)
Monocytes Absolute: 0.5 10*3/uL (ref 0.1–1.0)
Monocytes Relative: 12 %
Neutro Abs: 3.1 10*3/uL (ref 1.7–7.7)
Neutrophils Relative %: 69 %
Platelets: 59 10*3/uL — ABNORMAL LOW (ref 150–400)
RBC: 2.92 MIL/uL — ABNORMAL LOW (ref 3.87–5.11)
RDW: 18.7 % — ABNORMAL HIGH (ref 11.5–15.5)
WBC: 4.5 10*3/uL (ref 4.0–10.5)
nRBC: 0 % (ref 0.0–0.2)

## 2019-07-07 LAB — GLUCOSE, CAPILLARY
Glucose-Capillary: 114 mg/dL — ABNORMAL HIGH (ref 70–99)
Glucose-Capillary: 105 mg/dL — ABNORMAL HIGH (ref 70–99)
Glucose-Capillary: 65 mg/dL — ABNORMAL LOW (ref 70–99)
Glucose-Capillary: 78 mg/dL (ref 70–99)
Glucose-Capillary: 104 mg/dL — ABNORMAL HIGH (ref 70–99)

## 2019-07-07 LAB — BASIC METABOLIC PANEL
Anion gap: 11 (ref 5–15)
BUN: 8 mg/dL (ref 8–23)
CO2: 24 mmol/L (ref 22–32)
Calcium: 8.7 mg/dL — ABNORMAL LOW (ref 8.9–10.3)
Chloride: 101 mmol/L (ref 98–111)
Creatinine, Ser: 0.72 mg/dL (ref 0.44–1.00)
GFR calc Af Amer: >60 mL/min (ref >60)
GFR calc non Af Amer: >60 mL/min (ref >60)
Glucose, Bld: 130 mg/dL — ABNORMAL HIGH (ref 70–99)
Potassium: 4.2 mmol/L (ref 3.5–5.1)
Sodium: 136 mmol/L (ref 135–145)

## 2019-07-07 NOTE — Progress Notes (Signed)
Occupational Therapy Session Note  Patient Details  Name: Brittany Russo MRN: [id number] Date of Birth: 24-Jun-1945  Today's Date: 07/07/2019 OT Individual Time: 1100-1201 OT Individual Time Calculation (min): 61 min   OT Individual Time: 8159-4707 OT Individual Time Calculation (min): 73 min  Short Term Goals: Week 1:  OT Short Term Goal 1 (Week 1): Pt will complete toilet transfers to drop arm BSC with mod assist OT Short Term Goal 2 (Week 1): Pt will complete LB dressing with mod assist with lateral leans vs sit > stand OT Short Term Goal 3 (Week 1): Pt will complete toileting tasks with mod assist  Skilled Therapeutic Interventions/Progress Updates:  Session 1: Patient met seated in wc in agreement with OT treatment session. 0/10 pain reported at rest and with activity. Patient indicated desire to wash hair with total A provided for shampooing/conditioning. Patient able to comb hair seated at sink level with set-up assist. Wc mobility in room requiring assistance to navigate around sharp corners and in tight spaces to gather dirty laundry. Patient self-propelled wc >149f to laundry room with one rest break. Patient able to reach forward to place clothing in washing machine. Total A for transport to ortho gym for energy conservation and time management. SB transfer from wc <> mat table with Mod A and cueing for hand placement, head/hip relationship and power negotiation in prep for toilet transfers. Patient notes 6/10 level of anxiety secondary to fear of falling. Patient self-propelled wc back to room with session concluding with belt alarm activated, call bell within reach, and all needs met.   Session 2: Patient met seated in wc in agreement with OT treatment session. Patient indicating need to void. SB transfer <> supine x3 with Mod A R<>L and cueing for hand placement, power negotiation, and head/hip relationship. Patient able to roll R<>L with Min A for clothing management and bedpan  placement. LB bathing in supine with Min A and UB bathing/dressing seated EOB with Min A and fair dynamic sitting balance. Total A to doff/don L TED hose and AFO. Laundry folding task seated in wc with patient able to navigate in tight spaces to place folded clothing in drawers. Therapeutic exercise with BUE HEP using 1lb weighted dowel with cues for form. Patient demonstrates decreased motor planning with complex/new tasks. Session concluded with patient lying supine in bed, call bell within reach, and bed alarm activated.   Chest press 2x 10  Shoulder flexion 2x 10  Overhead elbow extension 2 x10  Elbow flexion 2x 10   Therapy Documentation Precautions:  Precautions Precautions: Fall Precaution Comments: R AKA, like to wear her L AFO/shoe Restrictions Weight Bearing Restrictions: Yes RLE Weight Bearing: Non weight bearing  Therapy/Group: Individual Therapy  Loella Hickle [name] 07/07/2019, 7:47 AM

## 2019-07-07 NOTE — Progress Notes (Signed)
[place] PHYSICAL MEDICINE & REHABILITATION PROGRESS NOTE  Subjective/Complaints: Patient seen transferring with therapy this morning.  She states she slept well overnight and had benefits with gabapentin.  She does note some grogginess this morning, however appears to be alert and participating well.  Discussed resting and plans for stump shrinker with therapies.  ROS: Denies CP, shortness of breath, nausea, vomiting, diarrhea.  Objective: Vital Signs: Blood pressure 134/87, pulse 82, temperature 97.8 F (36.6 C), temperature source Oral, resp. rate 18, height 5' 5"  (1.651 m), weight 73.6 kg, SpO2 94 %. No results found. Recent Labs    07/05/19 0602 07/07/19 0551  WBC 4.7 4.5  HGB 8.7* 8.4*  HCT 26.7* 28.2*  PLT 65* 59*   Recent Labs    07/05/19 0602 07/07/19 0551  NA 134* 136  K 4.0 4.2  CL 100 101  CO2 26 24  GLUCOSE 157* 130*  BUN 13 8  CREATININE 0.73 0.72  CALCIUM 8.2* 8.7*    Physical Exam: BP 134/87 (BP Location: Right Arm)   Pulse 82   Temp 97.8 F (36.6 C) (Oral)   Resp 18   Ht 5' 5"  (1.651 m)   Wt 73.6 kg   SpO2 94%   BMI 27.00 kg/m  Constitutional: No distress . Vital signs reviewed. HENT: Normocephalic.  Atraumatic. Eyes: EOMI. No discharge. Cardiovascular: No JVD. Respiratory: Normal effort.  No stridor. GI: Non-distended. Skin: Scattered bruising AKA with dressing C/D/I Psych: Mood and affect improving. Musc: Right AKA with edema and tenderness Neuro: Alert Motor:  Left lower extremity: Hip flexion, knee extension 3 --3/5, ankle dorsiflexion 4+/5,  Right lower extremity: Hip flexion 4/5 (some pain inhibition)  Assessment/Plan: 1. Functional deficits secondary to right AKA which require 3+ hours per day of interdisciplinary therapy in a comprehensive inpatient rehab setting.  Physiatrist is providing close team supervision and 24 hour management of active medical problems listed below.  Physiatrist and rehab team continue to assess  barriers to discharge/monitor patient progress toward functional and medical goals  Care Tool:  Bathing    Body parts bathed by patient: Right arm, Left arm, Chest, Abdomen, Front perineal area, Right upper leg, Left upper leg   Body parts bathed by helper: Buttocks, Left lower leg Body parts n/a: Right lower leg(Rt AKA)   Bathing assist Assist Level: Minimal Assistance - Patient > 75%(bed level)     Upper Body Dressing/Undressing Upper body dressing   What is the patient wearing?: Pull over shirt    Upper body assist Assist Level: Minimal Assistance - Patient > 75%    Lower Body Dressing/Undressing Lower body dressing      What is the patient wearing?: Pants, Incontinence brief     Lower body assist Assist for lower body dressing: Moderate Assistance - Patient 50 - 74%     Toileting Toileting    Toileting assist Assist for toileting: Maximal Assistance - Patient 25 - 49% Assistive Device Comment: Bedpan   Transfers Chair/bed transfer  Transfers assist  Chair/bed transfer activity did not occur: Safety/medical concerns  Chair/bed transfer assist level: Moderate Assistance - Patient 50 - 74%     Locomotion Ambulation   Ambulation assist   Ambulation activity did not occur: Safety/medical concerns          Walk 10 feet activity   Assist  Walk 10 feet activity did not occur: Safety/medical concerns        Walk 50 feet activity   Assist Walk 50 feet with 2 turns  activity did not occur: Safety/medical concerns         Walk 150 feet activity   Assist Walk 150 feet activity did not occur: Safety/medical concerns         Walk 10 feet on uneven surface  activity   Assist Walk 10 feet on uneven surfaces activity did not occur: Safety/medical concerns         Wheelchair     Assist Will patient use wheelchair at discharge?: Yes Type of Wheelchair: Manual    Wheelchair assist level: Supervision/Verbal cueing Max wheelchair  distance: 150'    Wheelchair 50 feet with 2 turns activity    Assist        Assist Level: Supervision/Verbal cueing   Wheelchair 150 feet activity     Assist     Assist Level: Supervision/Verbal cueing      Medical Problem List and Plan: 1.  Impaired function, ADLs and mobility secondary to R AKA due to failed TKRs  Continue CIR 2.  Antithrombotics: -DVT/anticoagulation:  Cannot do lovenox due to low platelets             -antiplatelet therapy: N/A 3. Pain Management: Tramadol 50 mg qid with robaxin/Hydrocodone prn  Phantom limb pain    Gabapentin 100 BID started on 4/13, changed to 300 mg nightly on 4/15   Improved, monitor for time being, may consider decreasing dose if daytime lethargy. 4. Mood: LCSW to follow for evaluation and support.              -antipsychotic agents: N/A 5. Neuropsych: This patient is ?fully capable of making decisions on her own behalf.  Discussed admission with neuropsych, appreciate evaluation and recs 6. Skin/Wound Care: Monitor wound for healing once surgical dressing removed.   ABD pad, with Kerlix and Ace dressings to AKA. 7. Fluids/Electrolytes/Nutrition: Monitor I/Os 8. Cirrhosis of the liver: Continue Xifaxan.  Hyperammonemia   Lactulose started on 4/14-patient refusing, despite education 9. ABLA:   Hemoglobin 8.4 on 4/16  Continue to monitor 10. Thrombocytopenia: Monitor for signs of bleeding. Baseline at 74-->51.   Platelets 59 on 4/16  Continue to monitor 11. Chronic hyponatremia: Continue to monitor.   Sodium 136 on 4/16  Continue to monitor 12. T2DM with hyperglycemia: Hgb A1c-5.2. Continue glucotrol, trajenta, metformin and Lantus 20 units daily. Uses Lantus 20 units prn BS>180.   Slightly labile on 4/16, monitor for trend  Monitor with increased mobility 13. H/o depression: ON Lexapro, Xanax and Wellbutrin.  14.  Labile blood pressure  Monitor trend  LOS: 3 days A FACE TO FACE EVALUATION WAS PERFORMED  Ankit  Lorie Phenix 07/07/2019, 10:17 AM

## 2019-07-07 NOTE — Significant Event (Signed)
Hypoglycemic Event  CBG: 65  Treatment: 8 oz juice/soda  Symptoms: None  Follow-up CBG: Time:1717 CBG Result:75  Possible Reasons for Event: Unknown  Comments/MD notified: 8oz Sprite given, Graham crackers with peanut butter. Dinner tray arrived at [employer]. Patient was asymptomatic.    Creig Hines, Susa Raring

## 2019-07-07 NOTE — Progress Notes (Signed)
Physical Therapy Session Note  Patient Details  Name: Brittany Russo MRN: [id number] Date of Birth: Feb 29, 1946  Today's Date: 07/07/2019 PT Individual Time: 0800-0913 PT Individual Time Calculation (min): 73 min   Short Term Goals: Week 1:  PT Short Term Goal 1 (Week 1): Pt will initiate standing training PT Short Term Goal 2 (Week 1): Pt will transfer bed<>chair w/ min assist PT Short Term Goal 3 (Week 1): Pt will perform bed mobility w/ min assist PT Short Term Goal 4 (Week 1): Pt will maintain dynamic sitting balance w/ supervision  Skilled Therapeutic Interventions/Progress Updates:   Received pt supine in bed, pt agreeable to therapy, and reported 6/10 pain in R residual limb. NT and RN made aware, and RN administered medication during session. Repositioning, rest breaks, and distraction done to reduce pain levels. Session focused on dressing, functional mobility/transfers, UE strength, dynamic sitting balance/coordination, amputee education, and improved activity tolerance. Donned ted hose in supine with total assist. Donned pants in supine with mod A. Pt rolled to L and R with min A and use of bedrails and required mod A to pull pants over hips. Pt transferred supine<>sitting EOB with min A with use of bedrails and HOB elevated. Doffed dirty shirt and donned clean one with min A. Donned L AFO/brace with total assist. Pt transferred bed<>WC via slideboard mod A x 3 trials throughout session. Pt required verbal cues for anterior weight shifting, head/hips relationship, and hand placement on board and armrest of WC. Pt required total assist to comb hair seated in WC at sink. Pt reported that she thought there were broken pieces on her personal WC. Therapist inspected pt's WC and noted small cracks along the backside of the cushion, but otherwise pt's WC in good condition to use upon D/C. Pt performed WC mobility 168f using bilateral UEs and supervision to therapy gym. Pt transferred WC<>mat via  slideboard mod A with the same verbal cues. While sitting on mat pt worked on anterior weight shifting and clearing buttocks from mat 3x5 reps to assist with anterior weight shifting with transfers. Worked on scooting to L and R with same verbal cues for anterior weight shifting. Pt with increased anxiety and fear of falling throughout session. Worked on deep breathing techniques and therapist provided emotional support to help calm pt. Pt performed the following exercises seated on mat with close supervision and verbal cues for technique: -Bicep curls with 3lb dumbbell x8 bilaterally -Overhead chest press with 1lb dumbbell x 8 bilaterally  -LAQ on L LE x8  Pt transferred mat<>WC via slideboard with mod A. [facility]. Concluded session with pt sitting in WC, needs within reach, and seatbelt alarm on. Therapist provided pt with fresh ice water.   Therapy Documentation Precautions:  Precautions Precautions: Fall Precaution Comments: R AKA, like to wear her L AFO/shoe Restrictions Weight Bearing Restrictions: Yes RLE Weight Bearing: Non weight bearing  Therapy/Group: Individual Therapy AAlfonse AlpersPT, DPT   07/07/2019, 7:25 AM

## 2019-07-07 NOTE — Consult Note (Signed)
Neuropsychological Consultation   Patient:   Brittany Russo   DOB:   1945/07/31  MR Number:  [id number]  Location:  [facility] [address] C[facility] [address] [place] [id number] [place] [address] Dept: [facility] [place]: [phone]           Date of Service:   07/07/2019  Start Time:   10 AM End Time:   11 AM  Provider/Observer:  Ilean Skill, Psy.D.       Clinical Neuropsychologist       Billing Code/Service: [id number]  Chief Complaint:    Brittany Russo is a 74 year old female with a history of asthma, type 2 diabetes, depression anxiety, right cirrhosis of the liver with acute on chronic hepatic failure, GI bleeds, failed right TKR, complicated by fall with distal femur fracture and infection.  Right AKA on 06/28/2019 by Dr. Ihor Gully was performed after was determined that limb was not able to be salvaged.  Postoperatively the patient has been limited by fatigue, pain and hypoglycemia.  Patient has a long history of anxiety that have been exacerbated by these events in particular has anxiety around fear of falling and/or anxiety being triggered by phantom limb pain.  Also, the patient's brother has recently passed away and as of yet the patient has not been told although the patient's daughter is planning on telling her soon.  Reason for Service:  HPI: Brittany Russo is a 74 year old female with history of asthma, T2DM, depression/anxiety, right cirrhosis of the liver (NAFLD) with acute on chronic hepatic failure, GIBs,  failed R-TKR complicated by fall with distal  Femur fracture and infection. She has had pain with functional disability affecting QOL and elected to undergo R-AKA on 06/28/19 by Dr. Alvan Dame. Post op NWB and has been limited by fatigue, pain and hypoglycemia. ABLA and thrombocytopenia being monitored. Patient is being admitted to CIR.  Current Status:  The patient was very anxious and had a lot of concerns and anxiety and  described times with shaking with anxiety.  This is a longstanding condition that has been recently exacerbated by all of the surgical and medical issues with her leg.  The patient is currently taking Xanax, Wellbutrin and Lexapro as far as psychotropic medications.  Behavioral Observation: Brittany Russo  presents as a 74 y.o.-year-old Right Caucasian Female who appeared her stated age. her dress was Appropriate and she was Well Groomed and her manners were Appropriate to the situation.  her participation was indicative of Appropriate and Redirectable behaviors.  There were physical disabilities noted.  she displayed an appropriate level of cooperation and motivation.     Interactions:    Active Appropriate and Redirectable  Attention:   abnormal and attention span appeared shorter than expected for age primarily related to being distracted by internal preoccupations.  Memory:   abnormal; remote memory intact, recent memory impaired  Visuo-spatial:  not examined  Speech (Volume):  low  Speech:   normal; normal  Thought Process:  Coherent and Relevant  Though Content:  WNL; not suicidal and not homicidal  Orientation:   person, place, time/date and situation  Judgment:   Fair  Planning:   Poor  Affect:    Anxious  Mood:    Anxious  Insight:   Fair  Intelligence:   normal  Medical History:   Past Medical History:  Diagnosis Date  . Acute and subacute hepatic failure without coma   . Allergy   .  Anasarca   . Anxiety   . Arthritis   . Ascites 09/2018   no SBP on 850 cc tap 10/13/18  . Asthma    allery induced  . Cirrhosis of liver ([place]) ~ 2004   from NAFLD.  Dx ~ 2004.  Dr Dorcas Mcmurray at [facility]/    . Depression   . Diabetes mellitus without complication ([place])    type 2  . Eczema of both hands   . Endometrial cancer ([place]) 1996, 2003   hysterectomy 1996.  recurrence 2003, treated with radiation.    . Generalized edema   . GI hemorrhage   . Hematochezia 2009   radiation  proctosigmoiditis on colonoscopy 06/2007, DR Allyson Sabal Mir at [facility] in [place]  . Hyperlipidemia   . Hypertension   . Muscle weakness   . Seizures ([place])    last seizure June 07, 2014   . Thrombocytopenia ([place])   . Thyroid nodule         Abuse/Trauma History: Patient's brother has recently died and patient has not been told as of yet.  Psychiatric History:  Patient with prior history of anxiety and depression with exacerbation of anxiety recently.  Patient also has a history of seizures with past seizure in 2016.  Patient currently taking Xanax, Wellbutrin and Lexapro which were all been taking prior to current hospitalization as well.  Family Med/Psych History:  Family History  Problem Relation Age of Onset  . Heart disease Mother   . Diabetes Mother   . Heart disease Father   . Cancer Father   . Cancer Maternal Grandmother   . Heart disease Maternal Grandfather     Impression/DX:  Brittany Russo is a 74 year old female with a history of asthma, type 2 diabetes, depression anxiety, right cirrhosis of the liver with acute on chronic hepatic failure, GI bleeds, failed right TKR, complicated by fall with distal femur fracture and infection.  Right AKA on 06/28/2019 by Dr. Ihor Gully was performed after was determined that limb was not able to be salvaged.  Postoperatively the patient has been limited by fatigue, pain and hypoglycemia.  Patient has a long history of anxiety that have been exacerbated by these events in particular has anxiety around fear of falling and/or anxiety being triggered by phantom limb pain.  Also, the patient's brother has recently passed away and as of yet the patient has not been told although the patient's daughter is planning on telling her soon.  The patient was very anxious and had a lot of concerns and anxiety and described times with shaking with anxiety.  This is a longstanding condition that has been recently exacerbated by all of the surgical and medical  issues with her leg.  The patient is currently taking Xanax, Wellbutrin and Lexapro as far as psychotropic medications.  Disposition/Plan:  Will follow up with the patient first of next week to continue to work on issues related to her anxiety and depression.  Diagnosis:    Right AKA, anxiety, depression.        Electronically Signed   _______________________ Ilean Skill, Psy.D.

## 2019-07-08 ENCOUNTER — Inpatient Hospital Stay (HOSPITAL_COMMUNITY): Payer: 59 | Admitting: Physical Therapy

## 2019-07-08 ENCOUNTER — Inpatient Hospital Stay (HOSPITAL_COMMUNITY): Payer: 59

## 2019-07-08 LAB — GLUCOSE, CAPILLARY
Glucose-Capillary: 90 mg/dL (ref 70–99)
Glucose-Capillary: 72 mg/dL (ref 70–99)
Glucose-Capillary: 75 mg/dL (ref 70–99)
Glucose-Capillary: 113 mg/dL — ABNORMAL HIGH (ref 70–99)

## 2019-07-08 NOTE — Progress Notes (Signed)
PHYSICAL MEDICINE & REHABILITATION PROGRESS NOTE  Subjective/Complaints:   Pt reports she's sleepy this AM- has been awake since 5am- but already feels late- is ~8am.  Gets pain meds at 9am per pt.   Wants to change her therapy schedule- wants to do funeral for youngest brother today via facetime.     ROS:  Pt denies SOB, abd pain, CP, N/V/C/D, and vision changes   Objective: Vital Signs: Blood pressure (!) 146/52, pulse 79, temperature 97.7 F (36.5 C), temperature source Oral, resp. rate 19, height 5' 5"  (1.651 m), weight 73.6 kg, SpO2 97 %. No results found. Recent Labs    07/07/19 0551  WBC 4.5  HGB 8.4*  HCT 28.2*  PLT 59*   Recent Labs    07/07/19 0551  NA 136  K 4.2  CL 101  CO2 24  GLUCOSE 130*  BUN 8  CREATININE 0.72  CALCIUM 8.7*    Physical Exam: BP (!) 146/52   Pulse 79   Temp 97.7 F (36.5 C) (Oral)   Resp 19   Ht 5' 5"  (1.651 m)   Wt 73.6 kg   SpO2 97%   BMI 27.00 kg/m  Constitutional: No distress . Vital signs reviewed. Sitting up in bed; appears fatigued, NAD HENT: Normocephalic.  Atraumatic. Eyes: EOMI. No discharge. Cardiovascular: RRR no JVD Respiratory: CTA B/L- no W/R/R- good air movement. GI: Soft, NT, ND, (+)BS  Skin: Scattered bruising esp of UEs on forearms AKA with dressing C/D/I- just changed, so didn't unwrap Psych: depressed and near tears since funeral is today. Musc: Right AKA with edema and tenderness Neuro: alert, but vague Motor:  Left lower extremity: Hip flexion, knee extension 3 --3/5, ankle dorsiflexion 4+/5,  Right lower extremity: Hip flexion 4/5 (some pain inhibition)  Assessment/Plan: 1. Functional deficits secondary to right AKA which require 3+ hours per day of interdisciplinary therapy in a comprehensive inpatient rehab setting.  Physiatrist is providing close team supervision and 24 hour management of active medical problems listed below.  Physiatrist and rehab team continue to assess  barriers to discharge/monitor patient progress toward functional and medical goals  Care Tool:  Bathing    Body parts bathed by patient: Right arm, Left arm, Chest, Abdomen, Front perineal area, Right upper leg, Left upper leg, Left lower leg   Body parts bathed by helper: Buttocks Body parts n/a: Right lower leg(Rt AKA)   Bathing assist Assist Level: Minimal Assistance - Patient > 75%     Upper Body Dressing/Undressing Upper body dressing   What is the patient wearing?: Dress    Upper body assist Assist Level: Minimal Assistance - Patient > 75%    Lower Body Dressing/Undressing Lower body dressing      What is the patient wearing?: Underwear/pull up     Lower body assist Assist for lower body dressing: Moderate Assistance - Patient 50 - 74%     Toileting Toileting    Toileting assist Assist for toileting: Minimal Assistance - Patient > 75% Assistive Device Comment: Bedpan   Transfers Chair/bed transfer  Transfers assist  Chair/bed transfer activity did not occur: Safety/medical concerns  Chair/bed transfer assist level: Moderate Assistance - Patient 50 - 74%     Locomotion Ambulation   Ambulation assist   Ambulation activity did not occur: Safety/medical concerns          Walk 10 feet activity   Assist  Walk 10 feet activity did not occur: Safety/medical concerns  Walk 50 feet activity   Assist Walk 50 feet with 2 turns activity did not occur: Safety/medical concerns         Walk 150 feet activity   Assist Walk 150 feet activity did not occur: Safety/medical concerns         Walk 10 feet on uneven surface  activity   Assist Walk 10 feet on uneven surfaces activity did not occur: Safety/medical concerns         Wheelchair     Assist Will patient use wheelchair at discharge?: Yes Type of Wheelchair: Manual    Wheelchair assist level: Supervision/Verbal cueing Max wheelchair distance: 156f    Wheelchair 50  feet with 2 turns activity    Assist        Assist Level: Supervision/Verbal cueing   Wheelchair 150 feet activity     Assist     Assist Level: Supervision/Verbal cueing      Medical Problem List and Plan: 1.  Impaired function, ADLs and mobility secondary to R AKA due to failed TKRs  Continue CIR 2.  Antithrombotics: -DVT/anticoagulation:  Cannot do lovenox due to low platelets             -antiplatelet therapy: N/A 3. Pain Management: Tramadol 50 mg qid with robaxin/Hydrocodone prn  Phantom limb pain    Gabapentin 100 BID started on 4/13, changed to 300 mg nightly on 4/15   Improved, monitor for time being, may consider decreasing dose if daytime lethargy. 4. Mood: LCSW to follow for evaluation and support.              -antipsychotic agents: N/A 5. Neuropsych: This patient is ?fully capable of making decisions on her own behalf.  Discussed admission with neuropsych, appreciate evaluation and recs 6. Skin/Wound Care: Monitor wound for healing once surgical dressing removed.   ABD pad, with Kerlix and Ace dressings to AKA. 7. Fluids/Electrolytes/Nutrition: Monitor I/Os 8. Cirrhosis of the liver: Continue Xifaxan.  Hyperammonemia   Lactulose started on 4/14-patient refusing, despite education 9. ABLA:   Hemoglobin 8.4 on 4/16  Continue to monitor 10. Thrombocytopenia: Monitor for signs of bleeding. Baseline at 74-->51.   Platelets 59 on 4/16  Continue to monitor 11. Chronic hyponatremia: Continue to monitor.   Sodium 136 on 4/16  Continue to monitor 12. T2DM with hyperglycemia: Hgb A1c-5.2. Continue glucotrol, trajenta, metformin and Lantus 20 units daily. Uses Lantus 20 units prn BS>180.   Slightly labile on 4/16, monitor for trend   CBG (last 3)  Recent Labs    07/07/19 2107 07/08/19 0623 07/08/19 1155  GLUCAP 104* 90 72    4/17- BGs 72-104- slightly on low side, but WNL- will con't regimen and con't to monitor  Monitor with increased mobility 13.  H/o depression: ON Lexapro, Xanax and Wellbutrin.  4[place]17- [place]   14.  Labile blood pressure  4/17- BP 146/52 now- [id number] in last 2-3 days- con't regimen  Monitor trend  LOS: 4 days A FACE TO FACE EVALUATION WAS PERFORMED  Varick Keys 07/08/2019, 12:56 PM

## 2019-07-08 NOTE — Progress Notes (Signed)
Occupational Therapy Session Note  Patient Details  Name: SELINA PALU MRN: [id number] Date of Birth: [birth year]  Today's Date: 07/08/2019 OT Individual Time: 1300-1415 OT Individual Time Calculation (min): 75 min  and Today's Date: 07/08/2019 OT Missed Time: 11 Minutes Missed Time Reason: Unavailable (comment)(Watching brother's funeral)   Short Term Goals: Week 1:  OT Short Term Goal 1 (Week 1): Pt will complete toilet transfers to drop arm BSC with mod assist OT Short Term Goal 2 (Week 1): Pt will complete LB dressing with mod assist with lateral leans vs sit > stand OT Short Term Goal 3 (Week 1): Pt will complete toileting tasks with mod assist  Skilled Therapeutic Interventions/Progress Updates:    OT session focused on functional transfers, anterior weight shifting, and BUE strengthening. Pt received sitting in w/c demonstrating moderate anxiety. OT provided emotional support and education on breathing techniques. OT noticed dressing had fallen off of pt's wound therefore assisted to bed for RN to change dressing. Pt completed SBT with mod A and max cues for anterior weight shift. Completed sit<>supine with min A. Following dressing change, completed LB dressing with total A from bed level. Transferred back to chair with mod A then transitioned to therapy gym. Continued practice with SBT completing with min-mod A 2x. Engaged in dynamic reaching activity to promote anterior and lateral weight shifts for transfers and clothing management. Client demonstrates fair+ skill for weight shifts during exercises, however decreased carryover during functional transfers. Engaged in Harris strengthening exercises using 3# dowel bar as client completed 3x 10 reps of bicep curls and chest press with rest breaks between. At end of session, pt left in bed with all needs in reach.   Therapy Documentation Precautions:  Precautions Precautions: Fall Precaution Comments: R AKA, like to wear her L  AFO/shoe Restrictions Weight Bearing Restrictions: Yes RLE Weight Bearing: Non weight bearing General: General OT Amount of Missed Time: 54 Minutes PT Missed Treatment Reason: Unavailable (Comment);Other (Comment)(pt's brother's funeral) Vital Signs:  Pain: Pain Assessment Pain Scale: 0-10 Pain Score: 5  Faces Pain Scale: Hurts a little bit Pain Type: Phantom pain Pain Frequency: Intermittent Pain Intervention(s): Medication (See eMAR) PAINAD (Pain Assessment in Advanced Dementia) Breathing: normal Negative Vocalization: none Facial Expression: smiling or inexpressive Body Language: relaxed Consolability: no need to console PAINAD Score: 0 ADL: ADL Upper Body Bathing: Setup Where Assessed-Upper Body Bathing: Bed level Lower Body Bathing: Moderate assistance Where Assessed-Lower Body Bathing: Bed level Upper Body Dressing: Minimal assistance Where Assessed-Upper Body Dressing: Edge of bed Lower Body Dressing: Dependent Where Assessed-Lower Body Dressing: Bed level Vision   Perception    Praxis   Exercises:   Other Treatments:     Therapy/Group: Individual Therapy  Duayne Cal 07/08/2019, 12:57 PM

## 2019-07-08 NOTE — Progress Notes (Signed)
Dressing change completed.  Patient tolerated well and currently resting in bed.

## 2019-07-08 NOTE — Progress Notes (Signed)
Physical Therapy Session Note  Patient Details  Name: Brittany Russo MRN: [id number] Date of Birth: 07-26-1945  Today's Date: 07/08/2019 PT Individual Time: 2703-5009 PT Individual Time Calculation (min): 42 min   Short Term Goals: Week 1:  PT Short Term Goal 1 (Week 1): Pt will initiate standing training PT Short Term Goal 2 (Week 1): Pt will transfer bed<>chair w/ min assist PT Short Term Goal 3 (Week 1): Pt will perform bed mobility w/ min assist PT Short Term Goal 4 (Week 1): Pt will maintain dynamic sitting balance w/ supervision  Skilled Therapeutic Interventions/Progress Updates: Pt presented in bed agreeable to therapy. Pt states some pain in residual limb but premedicated and did not quantify. Pt had pants partially pulled up, PTA assisted with completing clothing management. Performed supine to sit with minA and use of bed features and required modA for scoot to EOB. Pt then performed SB transfer to L with modA. Cues for increasing anterior lean and head/hips relationship. Pt transported to rehab gym for time management. Performed SB transfer to R with heavy modA due to pt scooting posteriorly as well as laterally and ultimately foot unable to reach floor. Discussed with pt rationale of anterior lean and how foot coming off floor was a good example of what happens when one doesn't perform some anterior lean. Pt participated in ball taps with 1lb dowel 15 x 2 and placing clothespins on bar with PTA moving target further away to force pt to lean. Performed SB transfer to L with minA and improved head/hips relationship!. Pt propelled back to room and remained in w/c. Pt left with belt alarm on, call bell within reach and needs met.      Therapy Documentation Precautions:  Precautions Precautions: Fall Precaution Comments: R AKA, like to wear her L AFO/shoe Restrictions Weight Bearing Restrictions: Yes RLE Weight Bearing: Non weight bearing General: PT Amount of Missed Time (min): 18  Minutes PT Missed Treatment Reason: Unavailable (Comment);Other (Comment)(pt's brother's funeral) Vital Signs: Therapy Vitals Pulse Rate: 79 BP: (!) 146/52 Pain: Pain Assessment Pain Scale: 0-10 Pain Score: 7  Pain Type: Phantom pain Pain Frequency: Intermittent Pain Intervention(s): Medication (See eMAR)  Therapy/Group: Individual Therapy  Mecca Guitron  Aeron Donaghey, PTA  07/08/2019, 10:40 AM

## 2019-07-09 ENCOUNTER — Inpatient Hospital Stay (HOSPITAL_COMMUNITY): Payer: 59

## 2019-07-09 LAB — GLUCOSE, CAPILLARY
Glucose-Capillary: 60 mg/dL — ABNORMAL LOW (ref 70–99)
Glucose-Capillary: 68 mg/dL — ABNORMAL LOW (ref 70–99)
Glucose-Capillary: 73 mg/dL (ref 70–99)
Glucose-Capillary: 176 mg/dL — ABNORMAL HIGH (ref 70–99)
Glucose-Capillary: 127 mg/dL — ABNORMAL HIGH (ref 70–99)
Glucose-Capillary: 141 mg/dL — ABNORMAL HIGH (ref 70–99)

## 2019-07-09 MED ORDER — INSULIN GLARGINE 100 UNIT/ML ~~LOC~~ SOLN
15.0000 [IU] | Freq: Every day | SUBCUTANEOUS | Status: DC
  Filled 2019-07-09 (×5): qty 0.15

## 2019-07-09 MED ORDER — GERHARDT'S BUTT CREAM
TOPICAL_CREAM | Freq: Four times a day (QID) | CUTANEOUS | Status: DC
  Administered 2019-07-12 – 2019-07-27 (×4): 1 via TOPICAL
  Filled 2019-07-09 (×2): qty 1

## 2019-07-09 MED ORDER — INSULIN GLARGINE 100 UNIT/ML ~~LOC~~ SOLN
10.0000 [IU] | Freq: Every day | SUBCUTANEOUS | Status: DC
  Administered 2019-07-09: 10 [IU] via SUBCUTANEOUS
  Filled 2019-07-09: qty 0.1

## 2019-07-09 MED ORDER — INSULIN GLARGINE 100 UNIT/ML ~~LOC~~ SOLN: 10.0000 [IU] | Freq: Every day | SUBCUTANEOUS | Status: DC

## 2019-07-09 MED ORDER — LOPERAMIDE HCL 2 MG PO CAPS
4.0000 mg | ORAL_CAPSULE | Freq: Once | ORAL | Status: AC
  Administered 2019-07-09: 4 mg via ORAL
  Filled 2019-07-09: qty 2

## 2019-07-09 NOTE — Progress Notes (Signed)
Loose stools has slowed down ; Per NT patient had bloody urine. Denies pain ; no urgency and frequency  noted. Per patient she has bloody urine at times due to "endometrial CA." and she has to go back to her OBGYN for check up after discharge. Will continue to monitor.

## 2019-07-09 NOTE — Progress Notes (Signed)
Physical Therapy Session Note  Patient Details  Name: Brittany Russo MRN: [id number] Date of Birth: January 23, 1946  Today's Date: 07/09/2019 PT Individual Time: 2197-5883 PT Individual Time Calculation (min): 43 min  and Today's Date: 07/09/2019 PT Missed Time: 32 Minutes Missed Time Reason: Patient fatigue;Other (Comment)(loose bowels and uncomfortable)  Short Term Goals: Week 1:  PT Short Term Goal 1 (Week 1): Pt will initiate standing training PT Short Term Goal 2 (Week 1): Pt will transfer bed<>chair w/ min assist PT Short Term Goal 3 (Week 1): Pt will perform bed mobility w/ min assist PT Short Term Goal 4 (Week 1): Pt will maintain dynamic sitting balance w/ supervision  Skilled Therapeutic Interventions/Progress Updates:   Received pt supine in bed and reported pain 7/10 in R residual limb; pt reported she received pain medication prior to therapy. Pt reported not feeling well stating she has had 6 loose BMs since this morning and has felt "raw" and too weak to eat lunch. Pt politely declined OOB mobility however, pt agreeable to bed level exercises. Discussed family education with husband and daughter early next week to demonstrate transfers with slideboard and pt expressing wanting family to learn how to wrap R residual limb. Pt performed the following exercises supine in bed with supervision and verbal cues for technique: -SLR x12 bilaterally -Hip abduction x12 bilaterally -Bridges with R residual limb supported on pillow x10 and therapist blocking L LE from sliding into extension on bedsheets -Hip adduction pillow squeezes x10 with 3 second isometric hold -L sidelying R hip abduction x12 -L sidelying R hip extension x12  Pt required multiple rest breaks throughout session due to fatigue and increased anxiety with certain movements. Therapist provided emotional support and encouragement to assist in calming pt. Reviewed current HEP in room consisting of seated hip flexion, abduction, and  adduction and explained how to perform those same exercises in supine. Pt educated on importance of spending time in prone to stretch hip flexors and prepare R residual limb for her prosthetic; pt verbalized understanding. Pt rolled supine<>R sidelying<>prone with CGA and verbal cues for technique. Pt spent ~15 minutes lying in prone with emphasis on slow and controlled deep breathing to assist with anxiety. Pt performed the following exercises in prone with supervision and verbal cues for technique: -hip extension x10 on R LE (unable to perform on L LE due to weakness) -hamstring curls x12 on L LE -prone<>prone press ups on elbows x3 reps (pt with increased difficulty with motor control/sequencing of activity) Pt rolled prone<>R sidelying<>supine with min A. Concluded session with pt supine in bed, needs within reach, and bed alarm on. Therapist provided saltine crackers for pt. 32 minutes missed of skilled physical therapy due to fatigue/loose bowels.   Therapy Documentation Precautions:  Precautions Precautions: Fall Precaution Comments: R AKA, like to wear her L AFO/shoe Restrictions Weight Bearing Restrictions: Yes RLE Weight Bearing: Non weight bearing  Therapy/Group: Individual Therapy Alfonse Alpers PT, DPT   07/09/2019, 7:32 AM

## 2019-07-09 NOTE — Progress Notes (Signed)
Continues to have loose stools ; Patient claims she feels weak and raw down there. MD notified new orders noted.

## 2019-07-09 NOTE — Progress Notes (Addendum)
[place] PHYSICAL MEDICINE & REHABILITATION PROGRESS NOTE  Subjective/Complaints:   Pt reports had low BGs this AM- was 60- came up to 34- also feels sick to her stomach when her BG is low.  Will have nursing give Lantus 10 units instead of 20 units and hold oral meds.   Also had 3-4 BMs last night- and is going constantly this AM (per nurse, at least 6x already at 10am) - just got lactulose- no other bowel meds per nurse- and got lactulose due to elevated ammonia.    ROS:   Pt denies SOB, abd pain, CP, N/V/C/D, and vision changes   Objective: Vital Signs: Blood pressure (!) 148/56, pulse 72, temperature 97.6 F (36.4 C), temperature source Oral, resp. rate 20, height 5' 5"  (1.651 m), weight 73.6 kg, SpO2 97 %. No results found. Recent Labs    07/07/19 0551  WBC 4.5  HGB 8.4*  HCT 28.2*  PLT 59*   Recent Labs    07/07/19 0551  NA 136  K 4.2  CL 101  CO2 24  GLUCOSE 130*  BUN 8  CREATININE 0.72  CALCIUM 8.7*    Physical Exam: BP (!) 148/56 (BP Location: Left Arm)   Pulse 72   Temp 97.6 F (36.4 C) (Oral)   Resp 20   Ht 5' 5"  (1.651 m)   Wt 73.6 kg   SpO2 97%   BMI 27.00 kg/m  Constitutional: No distress . Vital signs reviewed. Pt laying back in bed; appears OK, good color, NAD HENT: Normocephalic.  Atraumatic. Eyes: EOMI. No discharge. Cardiovascular: RRR Respiratory: CTA B/L- no W/R/R- good air movement GI: Soft, NT, ND, (+)BS  Skin: Scattered bruising esp of UEs on forearms AKA with dressing C/D/I- 2 sets of staples- intact- small amount of serosanguinous drainage seen on dressing, not on AKA- swollen/dog ears coming inwards Psych: depressed and tired Musc: Right AKA with edema and tenderness Neuro: alert but vague Motor:  Left lower extremity: Hip flexion, knee extension 3 --3/5, ankle dorsiflexion 4+/5,  Right lower extremity: Hip flexion 4/5 (some pain inhibition)  Assessment/Plan: 1. Functional deficits secondary to right AKA which require  3+ hours per day of interdisciplinary therapy in a comprehensive inpatient rehab setting.  Physiatrist is providing close team supervision and 24 hour management of active medical problems listed below.  Physiatrist and rehab team continue to assess barriers to discharge/monitor patient progress toward functional and medical goals  Care Tool:  Bathing    Body parts bathed by patient: Right arm, Left arm, Chest, Abdomen, Front perineal area, Right upper leg, Left upper leg, Left lower leg   Body parts bathed by helper: Buttocks Body parts n/a: Right lower leg(Rt AKA)   Bathing assist Assist Level: Minimal Assistance - Patient > 75%     Upper Body Dressing/Undressing Upper body dressing   What is the patient wearing?: Dress    Upper body assist Assist Level: Minimal Assistance - Patient > 75%    Lower Body Dressing/Undressing Lower body dressing      What is the patient wearing?: Underwear/pull up     Lower body assist Assist for lower body dressing: Moderate Assistance - Patient 50 - 74%     Toileting Toileting    Toileting assist Assist for toileting: Minimal Assistance - Patient > 75% Assistive Device Comment: Bedpan   Transfers Chair/bed transfer  Transfers assist  Chair/bed transfer activity did not occur: Safety/medical concerns  Chair/bed transfer assist level: Moderate Assistance - Patient 50 - 74%  Locomotion Ambulation   Ambulation assist   Ambulation activity did not occur: Safety/medical concerns          Walk 10 feet activity   Assist  Walk 10 feet activity did not occur: Safety/medical concerns        Walk 50 feet activity   Assist Walk 50 feet with 2 turns activity did not occur: Safety/medical concerns         Walk 150 feet activity   Assist Walk 150 feet activity did not occur: Safety/medical concerns         Walk 10 feet on uneven surface  activity   Assist Walk 10 feet on uneven surfaces activity did not  occur: Safety/medical concerns         Wheelchair     Assist Will patient use wheelchair at discharge?: Yes Type of Wheelchair: Manual    Wheelchair assist level: Supervision/Verbal cueing Max wheelchair distance: 160f    Wheelchair 50 feet with 2 turns activity    Assist        Assist Level: Supervision/Verbal cueing   Wheelchair 150 feet activity     Assist     Assist Level: Supervision/Verbal cueing      Medical Problem List and Plan: 1.  Impaired function, ADLs and mobility secondary to R AKA due to failed TKRs  Continue CIR 2.  Antithrombotics: -DVT/anticoagulation:  Cannot do lovenox due to low platelets             -antiplatelet therapy: N/A 3. Pain Management: Tramadol 50 mg qid with robaxin/Hydrocodone prn  Phantom limb pain    Gabapentin 100 BID started on 4/13, changed to 300 mg nightly on 4/15   Improved, monitor for time being, may consider decreasing dose if daytime lethargy. 4. Mood: LCSW to follow for evaluation and support.              -antipsychotic agents: N/A 5. Neuropsych: This patient is ?fully capable of making decisions on her own behalf.  Discussed admission with neuropsych, appreciate evaluation and recs 6. Skin/Wound Care: Monitor wound for healing once surgical dressing removed.   ABD pad, with Kerlix and Ace dressings to AKA.  4/18- AKA looks good- con't regimen 7. Fluids/Electrolytes/Nutrition: Monitor I/Os 8. Cirrhosis of the liver: Continue Xifaxan.  Hyperammonemia   Lactulose started on 4/14-patient refusing, despite education  4/18- will stop lactulose for now- considering having constant BMs this AM and skin getting raw, red, painful-  9. ABLA:   Hemoglobin 8.4 on 4/16  Continue to monitor 10. Thrombocytopenia: Monitor for signs of bleeding. Baseline at 74-->51.   Platelets 59 on 4/16  Continue to monitor 11. Chronic hyponatremia: Continue to monitor.   Sodium 136 on 4/16  Continue to monitor 12. T2DM with  hyperglycemia: Hgb A1c-5.2. Continue glucotrol, trajenta, metformin and Lantus 20 units daily. Uses Lantus 20 units prn BS>180.   Slightly labile on 4/16, monitor for trend   CBG (last 3)  Recent Labs    07/09/19 0616 07/09/19 0641 07/09/19 0658  GLUCAP 60* 68* 73    4/17- BGs 72-104- slightly on low side, but WNL- will con't regimen and con't to monitor  4/18- BGs too low- 60/73 this AM- gave Lantus 10 units today since so low and will change from 20 to 15 units in AM  Monitor with increased mobility 13. H/o depression: ON Lexapro, Xanax and Wellbutrin.  4/17- [place]   14.  Labile blood pressure  4/17- BP 146/52 now- [id number] systolic in last 2-3 days- con't regimen  4/18- BP 148/56 this AM- will con't for now  Monitor trend 15. Loose stools-  4/18- held miralax and lactulose for now until stools get more regular, not constant- gave imodium x1- nursing insists is due to lactulose, not Cdiff Sx's.    LOS: 5 days A FACE TO FACE EVALUATION WAS PERFORMED  Wendy Mikles 07/09/2019, 11:18 AM

## 2019-07-09 NOTE — Significant Event (Signed)
Hypoglycemic Event  CBG: 60  Treatment: 4 oz juice/soda  Symptoms: None  Follow-up CBG: [id number] CBG Result:73  Possible Reasons for Event: Medication regimen: will be reviewed by MD  Comments/MD notified:Dr. Lovorn no new orders noted    Sallye Lat

## 2019-07-09 NOTE — Progress Notes (Signed)
Patient has been having loose stools reported 3x on report and has had 6 loose, watery stools since this morning; Held colace and miralax; lactulose given . MD notified.

## 2019-07-10 ENCOUNTER — Inpatient Hospital Stay (HOSPITAL_COMMUNITY): Payer: 59

## 2019-07-10 ENCOUNTER — Inpatient Hospital Stay (HOSPITAL_COMMUNITY): Payer: 59 | Admitting: Occupational Therapy

## 2019-07-10 LAB — BASIC METABOLIC PANEL
Anion gap: 10 (ref 5–15)
BUN: 9 mg/dL (ref 8–23)
CO2: 24 mmol/L (ref 22–32)
Calcium: 8.4 mg/dL — ABNORMAL LOW (ref 8.9–10.3)
Chloride: 97 mmol/L — ABNORMAL LOW (ref 98–111)
Creatinine, Ser: 0.64 mg/dL (ref 0.44–1.00)
GFR calc Af Amer: >60 mL/min (ref >60)
GFR calc non Af Amer: >60 mL/min (ref >60)
Glucose, Bld: 92 mg/dL (ref 70–99)
Potassium: 4.1 mmol/L (ref 3.5–5.1)
Sodium: 131 mmol/L — ABNORMAL LOW (ref 135–145)

## 2019-07-10 LAB — GLUCOSE, CAPILLARY
Glucose-Capillary: 87 mg/dL (ref 70–99)
Glucose-Capillary: 101 mg/dL — ABNORMAL HIGH (ref 70–99)
Glucose-Capillary: 142 mg/dL — ABNORMAL HIGH (ref 70–99)
Glucose-Capillary: 113 mg/dL — ABNORMAL HIGH (ref 70–99)

## 2019-07-10 LAB — CBC
HCT: 26.5 % — ABNORMAL LOW (ref 36.0–46.0)
Hemoglobin: 8.4 g/dL — ABNORMAL LOW (ref 12.0–15.0)
MCH: 30.1 pg (ref 26.0–34.0)
MCHC: 31.7 g/dL (ref 30.0–36.0)
MCV: 95 fL (ref 80.0–100.0)
Platelets: 82 10*3/uL — ABNORMAL LOW (ref 150–400)
RBC: 2.79 MIL/uL — ABNORMAL LOW (ref 3.87–5.11)
RDW: 19.1 % — ABNORMAL HIGH (ref 11.5–15.5)
WBC: 5.8 10*3/uL (ref 4.0–10.5)
nRBC: 0 % (ref 0.0–0.2)

## 2019-07-10 MED ORDER — GABAPENTIN 300 MG PO CAPS
300.0000 mg | ORAL_CAPSULE | Freq: Two times a day (BID) | ORAL | Status: DC
  Administered 2019-07-10 – 2019-07-28 (×37): 300 mg via ORAL
  Filled 2019-07-10 (×37): qty 1

## 2019-07-10 MED ORDER — GLIPIZIDE 5 MG PO TABS
2.5000 mg | ORAL_TABLET | Freq: Two times a day (BID) | ORAL | Status: DC
  Administered 2019-07-10 – 2019-07-14 (×7): 2.5 mg via ORAL
  Filled 2019-07-10 (×5): qty 0.5
  Filled 2019-07-10: qty 1
  Filled 2019-07-10 (×4): qty 0.5

## 2019-07-10 NOTE — Progress Notes (Signed)
[facility] PHYSICAL MEDICINE & REHABILITATION PROGRESS NOTE  Subjective/Complaints: Patient seen sitting up in bed this morning, getting ready for the day.  She states she slept well overnight.  She notes she continues to have phantom pain.  Discussed lactulose again with patient.  ROS: + Phantom limb pain.  Denies CP, SOB, N/V/D  Objective: Vital Signs: Blood pressure (!) 139/57, pulse 81, temperature 98.5 F (36.9 C), temperature source Oral, resp. rate 18, height 5' 5"  (1.651 m), weight 73.6 kg, SpO2 95 %. No results found. Recent Labs    07/10/19 0538  WBC 5.8  HGB 8.4*  HCT 26.5*  PLT 82*   Recent Labs    07/10/19 0538  NA 131*  K 4.1  CL 97*  CO2 24  GLUCOSE 92  BUN 9  CREATININE 0.64  CALCIUM 8.4*    Physical Exam: BP (!) 139/57 (BP Location: Right Arm)   Pulse 81   Temp 98.5 F (36.9 C) (Oral)   Resp 18   Ht 5' 5"  (1.651 m)   Wt 73.6 kg   SpO2 95%   BMI 27.00 kg/m  Constitutional: No distress . Vital signs reviewed. HENT: Normocephalic.  Atraumatic. Eyes: EOMI. No discharge. Cardiovascular: No JVD. Respiratory: Normal effort.  No stridor. GI: Non-distended. Skin: Scattered brusing Right AKA with dressing C/D/I Psych: Normal mood.  Normal behavior. Musc: Right AKA with edema and tenderness, improving Neuro: Alert Upper extremity tremors (baseline per patient) Motor:  Left lower extremity: Hip flexion, knee extension 4 -/5, ankle dorsiflexion 4+/5 Right lower extremity: Hip flexion 4/5 (some pain inhibition), stable  Assessment/Plan: 1. Functional deficits secondary to right AKA which require 3+ hours per day of interdisciplinary therapy in a comprehensive inpatient rehab setting.  Physiatrist is providing close team supervision and 24 hour management of active medical problems listed below.  Physiatrist and rehab team continue to assess barriers to discharge/monitor patient progress toward functional and medical goals  Care Tool:  Bathing    Body parts bathed by patient: Right arm, Left arm, Chest, Abdomen, Front perineal area, Right upper leg, Left upper leg, Left lower leg   Body parts bathed by helper: Buttocks Body parts n/a: Right lower leg(Rt AKA)   Bathing assist Assist Level: Minimal Assistance - Patient > 75%     Upper Body Dressing/Undressing Upper body dressing   What is the patient wearing?: Dress    Upper body assist Assist Level: Minimal Assistance - Patient > 75%    Lower Body Dressing/Undressing Lower body dressing      What is the patient wearing?: Underwear/pull up     Lower body assist Assist for lower body dressing: Moderate Assistance - Patient 50 - 74%     Toileting Toileting    Toileting assist Assist for toileting: Minimal Assistance - Patient > 75% Assistive Device Comment: Bedpan   Transfers Chair/bed transfer  Transfers assist  Chair/bed transfer activity did not occur: Safety/medical concerns  Chair/bed transfer assist level: Moderate Assistance - Patient 50 - 74%     Locomotion Ambulation   Ambulation assist   Ambulation activity did not occur: Safety/medical concerns          Walk 10 feet activity   Assist  Walk 10 feet activity did not occur: Safety/medical concerns        Walk 50 feet activity   Assist Walk 50 feet with 2 turns activity did not occur: Safety/medical concerns         Walk 150 feet activity  Assist Walk 150 feet activity did not occur: Safety/medical concerns         Walk 10 feet on uneven surface  activity   Assist Walk 10 feet on uneven surfaces activity did not occur: Safety/medical concerns         Wheelchair     Assist Will patient use wheelchair at discharge?: Yes Type of Wheelchair: Manual    Wheelchair assist level: Supervision/Verbal cueing Max wheelchair distance: 150f    Wheelchair 50 feet with 2 turns activity    Assist        Assist Level: Supervision/Verbal cueing   Wheelchair  150 feet activity     Assist     Assist Level: Supervision/Verbal cueing      Medical Problem List and Plan: 1.  Impaired function, ADLs and mobility secondary to R AKA due to failed TKRs  Continue CIR 2.  Antithrombotics: -DVT/anticoagulation:  Cannot do lovenox due to low platelets             -antiplatelet therapy: N/A 3. Pain Management: Tramadol 50 mg qid with robaxin/Hydrocodone prn  Phantom limb pain    Gabapentin 100 BID started on 4/13, changed to 300 mg nightly on 4/15, changed to twice daily on 4/19 4. Mood: LCSW to follow for evaluation and support.              -antipsychotic agents: N/A 5. Neuropsych: This patient is ?fully capable of making decisions on her own behalf.  Discussed admission with neuropsych, appreciate evaluation and recs 6. Skin/Wound Care: Monitor wound for healing once surgical dressing removed.   ABD pad, with Kerlix and Ace dressings to AKA. 7. Fluids/Electrolytes/Nutrition: Monitor I/Os 8. Cirrhosis of the liver: Continue Xifaxan.  Hyperammonemia   Lactulose started on 4/14, stopped due to loose stools on 4/18 9. ABLA:   Hemoglobin 8.4 on 4/19  Continue to monitor 10. Thrombocytopenia: Monitor for signs of bleeding. Baseline at 74-->51.   Platelets 32 on 4/19  Continue to monitor 11. Chronic hyponatremia: Continue to monitor.   Sodium 131 4/19  Continue to monitor 12. T2DM with hyperglycemia: Hgb A1c-5.2. Continue glucotrol, trajenta, metformin   Lantus 20 units daily, decreased to 15 units on 4/18  Labile on 4/19   CBG (last 3)  Recent Labs    07/09/19 1650 07/09/19 2108 07/10/19 0612  GLUCAP 127* 141* 87    Monitor with increased mobility 13. H/o depression: ON Lexapro, Xanax and Wellbutrin. 14.  Labile blood pressure  Slightly labile on 4/19, monitor for trend   LOS: 6 days A FACE TO FACE EVALUATION WAS PERFORMED  Mario Voong ALorie Phenix4/19/2021, 12:33 PM

## 2019-07-10 NOTE — Progress Notes (Signed)
Physical Therapy Session Note  Patient Details  Name: Brittany Russo MRN: [id number] Date of Birth: [birth year]  Today's Date: 07/10/2019 PT Individual Time: [id number] and 1100-1158 PT Individual Time Calculation (min): 55 min and 58 min  Short Term Goals: Week 1:  PT Short Term Goal 1 (Week 1): Pt will initiate standing training PT Short Term Goal 2 (Week 1): Pt will transfer bed<>chair w/ min assist PT Short Term Goal 3 (Week 1): Pt will perform bed mobility w/ min assist PT Short Term Goal 4 (Week 1): Pt will maintain dynamic sitting balance w/ supervision  Skilled Therapeutic Interventions/Progress Updates:   Treatment Session 8:9381-0175 55 min Received pt supine in bed with RN present administering medication, pt agreeable to therapy, and reported phantom pain in R residual limb but did not state pain number. Repositioning, distraction, and rest breaks done to reduce pain. Session focused on functional mobility/transfers, UE/LE strength, dynamic sitting balance/coordination, and improved activity tolerance. Donned L LE ted hose in supine with total assist. Pt called daughter and husband to have them bring her socks this afternoon. Pt transferred supine<>sitting EOB with HOB elevated with min A. Donned L shoe/AFO with total assist. Pt transferred bed<>WC via slideboard with mod A. Pt with continued trembling from anxiety and required increased time and verbal cues for anterior weight shifting, head/hips relationship, and hand placement on board and WC armrest. Pt performed WC mobility 25f using bilateral UEs and supervision to ortho gym. Pt performed simulated car transfer using slideboard and mod A with same verbal cues for transfer technique. Pt reported difficulty breathing and got herself worked up during transfer. Worked on pursed lip breathing technique and pt reported feeling much better afterwards. [facility]. Pt performed L LE strengthening on Kinetron  at 50 cm/sec x 12 reps and at 20cm/sec x 12 reps with back support and 2x12 at 20cm/sec without back support with therapist providing counter resistance. Pt performed WC mobility >1524fusing bilateral UEs back to room. Concluded session with pt sitting in WC, needs within reach, and seatbelt alarm on.  Treatment Session 2: [id number] min Received pt sitting in WC, pt agreeable to therapy, and reported pain in R residual limb but did not state pain level. Repositioning, distraction, and rest breaks done to reduce pain. Session focused on functional mobility/transfers, UE/LE strength, dynamic sitting balance/coordination, and improved activity tolerance. Pt [facility]. Pt transferred WC<>mat via slideboard mod A. While sitting on mat pt worked on dynamic sitting balance bouncing ball against rebounder 3x15 with CGA. Pt performed the following exercises seated on mat with bilateral UE support, close supervision, verbal cues for technique: -tricep extensions on yoga blocks 2x8  -L LE hip flexion 2x8  -L LE knee extension x8 Pt reported 10/10 fatigue after activities and increased low back pain with dynamic sitting activities and requested to lie down. Pt transferred sit<>supine with min A. Pt performed the following exercises in supine with supervision and verbal cues for technique: -bridges 2x8 with cues to squeeze glutes rather than arching back  -SLR x8 bilaterally  -hip abduction x8 bilaterally  -L sidelying R hip abduction 2x8 -L sidelying R hip extension 2x8  Pt with increased difficulty getting body into midline position while supine on mat requiring max verbal and tactile cues for motor planning. Pt rolled L sidelying<>supine<>sitting on mat with min A. Pt transferred mat<>WC via slideboard with mod A. Pt performed the following exercises seated  in [facility] with back support, supervision, and verbal cues for technique: -overhead chest press to horizontal chest press  with 1lb dowel x8  -bicep curls with 1lb dowel x10 -diagonal PNF x10 bilaterally  Pt transported back to room in [facility] total assist. Concluded session with pt sitting in WC, needs within reach, and seatbelt alarm on. Therapist provided drink for pt.   Therapy Documentation Precautions:  Precautions Precautions: Fall Precaution Comments: R AKA, like to wear her L AFO/shoe Restrictions Weight Bearing Restrictions: Yes RLE Weight Bearing: Non weight bearing  Therapy/Group: Individual Therapy Alfonse Alpers PT, DPT   07/10/2019, 7:25 AM

## 2019-07-10 NOTE — Progress Notes (Signed)
Occupational Therapy Session Note  Patient Details  Name: Brittany Russo MRN: [id number] Date of Birth: Jan 20, 1946  Today's Date: 07/10/2019 OT Individual Time: 1345-1458 OT Individual Time Calculation (min): 73 min    Short Term Goals: Week 1:  OT Short Term Goal 1 (Week 1): Pt will complete toilet transfers to drop arm BSC with mod assist OT Short Term Goal 2 (Week 1): Pt will complete LB dressing with mod assist with lateral leans vs sit > stand OT Short Term Goal 3 (Week 1): Pt will complete toileting tasks with mod assist  Skilled Therapeutic Interventions/Progress Updates:  Patient met seated in wc in agreement with OT treatment session with focus on self-care re-education and functional transfers as detailed below. Husband and daughter present upon entrance but leaving prior to beginning of session. Patient with request to wash hair seated at sink level. UB bathing/dressing seated at sink with set-up A. LB bathing with Mod A for maintaining semi-stand x4 trials and use of BUE on armrests and sink surface. Wc mobility from room to nurses station with supervision A and Total A from nurses station to dayroom. Sit to stand x3 trials in prep for LB bathing/dressing at sink level and toilet transfers with Mod A to boots hips and Mod A progressing to Min A to maintain standing balance. Patient continues to demonstrate increased anxiety with functional transfers secondary to fear of falling. Total A for wc transport back to room for time management. Session concluded with patient supine in bed with call bell within reach, bed alarm activated, and all needs met.   Therapy Documentation Precautions:  Precautions Precautions: Fall Precaution Comments: R AKA, like to wear her L AFO/shoe Restrictions Weight Bearing Restrictions: Yes RLE Weight Bearing: Non weight bearing  Therapy/Group: Individual Therapy  Shylynn Bruning [name] 07/10/2019, 8:03 AM

## 2019-07-10 NOTE — Plan of Care (Signed)
  Problem: Consults Goal: RH LIMB LOSS PATIENT EDUCATION Description: Description: See Patient Education module for eduction specifics. Outcome: Progressing Goal: Skin Care Protocol Initiated - if Braden Score 18 or less Description: If consults are not indicated, leave blank or document N/A Outcome: Progressing Goal: Diabetes Guidelines if Diabetic/Glucose > 140 Description: If diabetic or lab glucose is > 140 mg/dl - Initiate Diabetes/Hyperglycemia Guidelines & Document Interventions  Outcome: Progressing   Problem: RH SKIN INTEGRITY Goal: RH STG SKIN FREE OF INFECTION/BREAKDOWN Description: Patients skin will remain free from further infection or breakdown with Min assist. Outcome: Progressing Goal: RH STG MAINTAIN SKIN INTEGRITY WITH ASSISTANCE Description: STG Maintain Skin Integrity With min Assistance. Outcome: Progressing Flowsheets (Taken 07/10/2019 1438) STG: Maintain skin integrity with assistance: 3-Moderate assistance Goal: RH STG ABLE TO PERFORM INCISION/WOUND CARE W/ASSISTANCE Description: STG Able To Perform Incision/Wound Care With total Assistance from caregiver. Outcome: Progressing Flowsheets (Taken 07/10/2019 1438) STG: Pt will be able to perform incision/wound care with assistance: 3-Moderate assistance   Problem: RH SAFETY Goal: RH STG ADHERE TO SAFETY PRECAUTIONS W/ASSISTANCE/DEVICE Description: STG Adhere to Safety Precautions With mod I  Assistance/Device. Outcome: Progressing Flowsheets (Taken 07/10/2019 1438) STG:Pt will adhere to safety precautions with assistance/device: 3-Moderate assistance   Problem: RH PAIN MANAGEMENT Goal: RH STG PAIN MANAGED AT OR BELOW PT'S PAIN GOAL Description: < 4 Outcome: Progressing   Problem: RH KNOWLEDGE DEFICIT LIMB LOSS Goal: RH STG INCREASE KNOWLEDGE OF SELF CARE AFTER LIMB LOSS Description: Patient/caregiver will verbalize understanding of treatment, monitoring, medications, and follow up care of her new amputation  with min assist. Outcome: Progressing

## 2019-07-11 ENCOUNTER — Inpatient Hospital Stay (HOSPITAL_COMMUNITY): Payer: 59

## 2019-07-11 ENCOUNTER — Inpatient Hospital Stay (HOSPITAL_COMMUNITY): Payer: 59 | Admitting: Occupational Therapy

## 2019-07-11 DIAGNOSIS — R319 Hematuria, unspecified: Secondary | ICD-10-CM

## 2019-07-11 LAB — URINALYSIS, ROUTINE W REFLEX MICROSCOPIC
Bilirubin Urine: NEGATIVE
Glucose, UA: NEGATIVE mg/dL
Ketones, ur: NEGATIVE mg/dL
Nitrite: POSITIVE — AB
Protein, ur: 100 mg/dL — AB
RBC / HPF: >50 RBC/hpf — ABNORMAL HIGH (ref 0–5)
Specific Gravity, Urine: 1.02 (ref 1.005–1.030)
WBC, UA: >50 WBC/hpf — ABNORMAL HIGH (ref 0–5)
pH: 6 (ref 5.0–8.0)

## 2019-07-11 LAB — GLUCOSE, CAPILLARY
Glucose-Capillary: 101 mg/dL — ABNORMAL HIGH (ref 70–99)
Glucose-Capillary: 109 mg/dL — ABNORMAL HIGH (ref 70–99)
Glucose-Capillary: 104 mg/dL — ABNORMAL HIGH (ref 70–99)
Glucose-Capillary: 127 mg/dL — ABNORMAL HIGH (ref 70–99)

## 2019-07-11 NOTE — Plan of Care (Signed)
  Problem: Consults Goal: RH LIMB LOSS PATIENT EDUCATION Description: Description: See Patient Education module for eduction specifics. Outcome: Progressing Goal: Skin Care Protocol Initiated - if Braden Score 18 or less Description: If consults are not indicated, leave blank or document N/A Outcome: Progressing Goal: Diabetes Guidelines if Diabetic/Glucose > 140 Description: If diabetic or lab glucose is > 140 mg/dl - Initiate Diabetes/Hyperglycemia Guidelines & Document Interventions  Outcome: Progressing   Problem: RH SKIN INTEGRITY Goal: RH STG SKIN FREE OF INFECTION/BREAKDOWN Description: Patients skin will remain free from further infection or breakdown with Min assist. Outcome: Progressing Goal: RH STG MAINTAIN SKIN INTEGRITY WITH ASSISTANCE Description: STG Maintain Skin Integrity With min Assistance. Outcome: Progressing Goal: RH STG ABLE TO PERFORM INCISION/WOUND CARE W/ASSISTANCE Description: STG Able To Perform Incision/Wound Care With total Assistance from caregiver. Outcome: Progressing   Problem: RH SAFETY Goal: RH STG ADHERE TO SAFETY PRECAUTIONS W/ASSISTANCE/DEVICE Description: STG Adhere to Safety Precautions With mod I  Assistance/Device. Outcome: Progressing   Problem: RH PAIN MANAGEMENT Goal: RH STG PAIN MANAGED AT OR BELOW PT'S PAIN GOAL Description: < 4 Outcome: Progressing   Problem: RH KNOWLEDGE DEFICIT LIMB LOSS Goal: RH STG INCREASE KNOWLEDGE OF SELF CARE AFTER LIMB LOSS Description: Patient/caregiver will verbalize understanding of treatment, monitoring, medications, and follow up care of her new amputation with min assist. Outcome: Progressing

## 2019-07-11 NOTE — Progress Notes (Signed)
Occupational Therapy Session Note  Patient Details  Name: Brittany Russo MRN: [id number] Date of Birth: 17-Aug-1945  Today's Date: 07/11/2019 OT Individual Time: 1300-1341 OT Individual Time Calculation (min): 41 min    Short Term Goals: Week 1:  OT Short Term Goal 1 (Week 1): Pt will complete toilet transfers to drop arm BSC with mod assist OT Short Term Goal 2 (Week 1): Pt will complete LB dressing with mod assist with lateral leans vs sit > stand OT Short Term Goal 3 (Week 1): Pt will complete toileting tasks with mod assist  Skilled Therapeutic Interventions/Progress Updates:  Patient met seated in wc in agreement with OT treatment session with focus on self-care re-education as detailed below. Patient indicating need to void. Wc mobility to bathroom with assistance to navigate chair up small slope. Patient required Mod-Max A for sit to stand at grab bar in bathroom with patient able to maintain standing position long enough for OT to manage clothing and place [facility] behind patient. Stand to sit on [facility] with Mod A for controlled descent. Patient with bloody urine. RN and MD aware. Patient required multiple attempts for sit to stand from [facility] secondary to increased anxiety and fatigue with Max A to boost to standing for hygiene. Patient able to clean front perineal area in sitting but required assistance from OT to wipe bottom in standing. Patient unable to maintain standing long enough to hike pants over hips. Return to supine via SB transfer with Min-Mod A and cueing for head/hip relationship. Patient able to roll R<>L in supine with Min A to hike pants over hips. Session concluded with patient lying supine in bed with call bell within reach, bed alarm activated, and all needs met.   Therapy Documentation Precautions:  Precautions Precautions: Fall Precaution Comments: R AKA, like to wear her L AFO/shoe Restrictions Weight Bearing Restrictions: Yes RLE Weight Bearing: Non weight  bearing  Therapy/Group: Individual Therapy  Tahjanae Blankenburg [name] 07/11/2019, 7:53 AM

## 2019-07-11 NOTE — Progress Notes (Signed)
Physical Therapy Session Note  Patient Details  Name: Brittany Russo MRN: [id number] Date of Birth: 12-05-1945  Today's Date: 07/11/2019 PT Individual Time: 1100-1155 and 1415-1459  PT Individual Time Calculation (min): 55 min and 44 min  Short Term Goals: Week 1:  PT Short Term Goal 1 (Week 1): Pt will initiate standing training PT Short Term Goal 2 (Week 1): Pt will transfer bed<>chair w/ min assist PT Short Term Goal 3 (Week 1): Pt will perform bed mobility w/ min assist PT Short Term Goal 4 (Week 1): Pt will maintain dynamic sitting balance w/ supervision  Skilled Therapeutic Interventions/Progress Updates:   Treatment Session 1: 1100-1155 55 min Received pt sitting in WC, pt agreeable to therapy, and denied any pain during session (premedicated). Session focused on functional mobility/transfers, UE/LE strength, dynamic sitting balance, and improved endurance with activity. Pt with continued anxiety with movement as demonstrated by shaking/trembeling throughout session. Pt performed WC mobility 142f using bilateral UEs and supervision to therapy gym. Pt transferred WC<>mat via sideboard with min A and cues for anterior weight shifting, head/hips relationship, and hand placement on board. Pt performed the following exercises seated on mat with close supervision and verbal cues for technique: -trunk rotation with 4.4lb medicine ball x8 bilaterally -chest press with 4.4lb medicine ball x8 reps -volleyball toss with 1lb dowel 3x15 reps -isometric bilateral shoulder flexion onto physioball 2x10 with 2 second isometric hold -hip flexion on L LE 2x10 with bilateral UE support on mat -knee extension on L LE 2x10 with bilateral UE support on mat  -rows with grn TB 2x8 (pt required max verbal and tactile cues for scapular retraction due to poor motor control and UE weakness) -hamstring curls on L LE with grn TB 2x8 Pt transferred mat<>WC via slideboard min A and transported to ortho gym total  assist. Pt performed bilateral UE strengthening on UBE at level 2.5 for 3 minutes forward and 3 minutes backwards with supervision. Pt required multiple rest breaks throughout session due to increased fatigue. Pt [facility]. Concluded session with pt sitting in WC, needs within reach, and seatbelt alarm on.    Treatment Session 2: [id number] min Received pt supine in bed, pt agreeable to therapy, and did not state pain level during session. Session focused on functional mobility/transfers, UE/LE strength, dynamic sitting/standing balance, and improved activity tolerance. Pt transferred supine<>sitting EOB with min A for LE management. Pt transferred bed<>WC via slideboard with min A and verbal cues for anterior weight shifting, head/hips relationship, and hand placement on board. Pt [facility] for time management purposes. Pt transferred sit<>stand mod A x 3 trials inside parallel bars with bilateral UE support on bars. Pt able to remain standing for approximately 15 seconds before requesting to sit. Pt transported to dayroom in [facility] and performed WC push ups 2x5 with verbal cues for tehcnique and increased difficulty. Pt transferred WC<>mat via slideboard min A x 2 additional trials. Pt worked on dyamic sitting balance playing cornhole x 3 trials with close supervivsion. Pt [facility]. Pt requested to return to bed and transferred WC<>bed via slideboard mod A and sit<>supine with min A. Doffed L brace with total assist. Concluded session with pt supine in bed, needs within reach, and bed alarm on. Therapist provided pt with cup of ice.   Therapy Documentation Precautions:  Precautions Precautions: Fall Precaution Comments: R AKA, like to wear  her L AFO/shoe Restrictions Weight Bearing Restrictions: Yes RLE Weight Bearing: Non weight bearing  Therapy/Group: Individual Therapy Alfonse Alpers PT, DPT   07/11/2019, 7:36 AM

## 2019-07-11 NOTE — Progress Notes (Signed)
Occupational Therapy Session Note  Patient Details  Name: Brittany Russo MRN: [id number] Date of Birth: 08-Jan-1946  Today's Date: 07/11/2019 OT Individual Time: 0850-1000 OT Individual Time Calculation (min): 70 min    Short Term Goals: Week 1:  OT Short Term Goal 1 (Week 1): Pt will complete toilet transfers to drop arm BSC with mod assist OT Short Term Goal 2 (Week 1): Pt will complete LB dressing with mod assist with lateral leans vs sit > stand OT Short Term Goal 3 (Week 1): Pt will complete toileting tasks with mod assist  Skilled Therapeutic Interventions/Progress Updates:    Treatment session with focus on self-care retraining with sitting balance and transfers.  Pt received supine in bed with residual limb undressed.  Therapist applied dressing per MD order and educated pt on limb wrapping technique. Pt completed dressing tasks at bed level, did require assistance with threading LLE and RLE. Pt may benefit from education with AE for LB dressing. Completed bed mobility with min assist and CGA when sitting unsupported at EOB.  Completed slide board transfer bed > w/c with placement of slide board and min assist when transferring to w/c.  Completed grooming tasks in sitting at sink with setup to reach items.  Engaged in slide board transfers with focus on education with weight shifting and lateral leans for board placement.  Pt able to complete transfer to Lt with CGA and setup for slide board, min assist when transferring to Rt.  During 2nd attempt with transfers, pt required mod assist to Lt and max to Rt due to fatigue and difficulty with sequencing due to fatigue.  Returned to room and left upright in w/c with seat belt alarm on and all needs in reach.  Therapy Documentation Precautions:  Precautions Precautions: Fall Precaution Comments: R AKA, like to wear her L AFO/shoe Restrictions Weight Bearing Restrictions: Yes RLE Weight Bearing: Non weight bearing Pain:  Pt with c/o  phantom pain in residual limb.   Therapy/Group: Individual Therapy  Simonne Come 07/11/2019, 12:22 PM

## 2019-07-11 NOTE — Progress Notes (Addendum)
[place] PHYSICAL MEDICINE & REHABILITATION PROGRESS NOTE  Subjective/Complaints: Patient seen laying in bed this morning.  She states she slept well overnight and is waiting to get ready for the day.  She notes improvement in phantom limb pain.  ROS: Denies CP, SOB, N/V/D  Objective: Vital Signs: Blood pressure (!) 149/63, pulse 82, temperature 98.5 F (36.9 C), temperature source Oral, resp. rate 18, height 5' 5"  (1.651 m), weight 73.6 kg, SpO2 93 %. No results found. Recent Labs    07/10/19 0538  WBC 5.8  HGB 8.4*  HCT 26.5*  PLT 82*   Recent Labs    07/10/19 0538  NA 131*  K 4.1  CL 97*  CO2 24  GLUCOSE 92  BUN 9  CREATININE 0.64  CALCIUM 8.4*    Physical Exam: BP (!) 149/63 (BP Location: Right Arm)   Pulse 82   Temp 98.5 F (36.9 C) (Oral)   Resp 18   Ht 5' 5"  (1.651 m)   Wt 73.6 kg   SpO2 93%   BMI 27.00 kg/m  Constitutional: No distress . Vital signs reviewed. HENT: Normocephalic.  Atraumatic. Eyes: EOMI. No discharge. Cardiovascular: No JVD. Respiratory: Normal effort.  No stridor. GI: Non-distended. Skin: Scattered bruising Right AKA with serosanguineous drainage and erythema along staple lines Psych: Normal mood.  Normal behavior. Musc: Right AKA with edema and tenderness, improving Neuro: Alert Upper extremity tremors (baseline per patient),?  Improving Motor:  Left lower extremity: Hip flexion, knee extension 4 -/5, ankle dorsiflexion 4+/5 Right lower extremity: Hip flexion 4/5 (some pain inhibition), unchanged  Assessment/Plan: 1. Functional deficits secondary to right AKA which require 3+ hours per day of interdisciplinary therapy in a comprehensive inpatient rehab setting.  Physiatrist is providing close team supervision and 24 hour management of active medical problems listed below.  Physiatrist and rehab team continue to assess barriers to discharge/monitor patient progress toward functional and medical goals  Care  Tool:  Bathing    Body parts bathed by patient: Right arm, Left arm, Chest, Abdomen, Front perineal area, Right upper leg, Left upper leg, Left lower leg, Face   Body parts bathed by helper: Buttocks Body parts n/a: Right lower leg   Bathing assist Assist Level: Moderate Assistance - Patient 50 - 74%     Upper Body Dressing/Undressing Upper body dressing   What is the patient wearing?: Bra, Pull over shirt    Upper body assist Assist Level: Set up assist    Lower Body Dressing/Undressing Lower body dressing      What is the patient wearing?: Underwear/pull up     Lower body assist Assist for lower body dressing: Moderate Assistance - Patient 50 - 74%     Toileting Toileting    Toileting assist Assist for toileting: Minimal Assistance - Patient > 75% Assistive Device Comment: Bedpan   Transfers Chair/bed transfer  Transfers assist  Chair/bed transfer activity did not occur: Safety/medical concerns  Chair/bed transfer assist level: Moderate Assistance - Patient 50 - 74%     Locomotion Ambulation   Ambulation assist   Ambulation activity did not occur: Safety/medical concerns          Walk 10 feet activity   Assist  Walk 10 feet activity did not occur: Safety/medical concerns        Walk 50 feet activity   Assist Walk 50 feet with 2 turns activity did not occur: Safety/medical concerns         Walk 150 feet activity  Assist Walk 150 feet activity did not occur: Safety/medical concerns         Walk 10 feet on uneven surface  activity   Assist Walk 10 feet on uneven surfaces activity did not occur: Safety/medical concerns         Wheelchair     Assist Will patient use wheelchair at discharge?: Yes Type of Wheelchair: Manual    Wheelchair assist level: Supervision/Verbal cueing Max wheelchair distance: 119f    Wheelchair 50 feet with 2 turns activity    Assist        Assist Level: Supervision/Verbal cueing    Wheelchair 150 feet activity     Assist     Assist Level: Supervision/Verbal cueing      Medical Problem List and Plan: 1.  Impaired function, ADLs and mobility secondary to R AKA due to failed TKRs  Continue CIR 2.  Antithrombotics: -DVT/anticoagulation:  Cannot do lovenox due to low platelets             -antiplatelet therapy: N/A 3. Pain Management: Tramadol 50 mg qid with robaxin/Hydrocodone prn  Phantom limb pain    Gabapentin 100 BID started on 4/13, changed to 300 mg nightly on 4/15, changed to twice daily on 4/19  Controlled with meds on 4/20 4. Mood: LCSW to follow for evaluation and support.              -antipsychotic agents: N/A 5. Neuropsych: This patient is ?fully capable of making decisions on her own behalf.  Discussed admission with neuropsych, appreciate evaluation and recs 6. Skin/Wound Care: Monitor wound for healing once surgical dressing removed.   ABD pad, with Kerlix and Ace dressings to AKA. 7. Fluids/Electrolytes/Nutrition: Monitor I/Os 8. Cirrhosis of the liver: Continue Xifaxan.  Hyperammonemia   Lactulose started on 4/14, stopped due to loose stools on 4/18 9. ABLA:   Hemoglobin 8.4 on 4/19  Continue to monitor 10. Thrombocytopenia: Monitor for signs of bleeding. Baseline at 74-->51.   Platelets 82 on 4/19  Continue to monitor 11. Chronic hyponatremia: Continue to monitor.   Sodium 131 4/19, labs ordered for tomorrow  Continue to monitor 12. T2DM with hyperglycemia: Hgb A1c-5.2. Continue glucotrol, trajenta, metformin   Lantus 20 units daily, decreased to 15 units on 4/18  Slightly labile on 4/20 CBG (last 3)  Recent Labs    07/10/19 1736 07/10/19 2053 07/11/19 0624  GLUCAP 142* 113* 101*    Monitor with increased mobility 13. H/o depression: ON Lexapro, Xanax and Wellbutrin. 14.  Labile blood pressure  Slightly labile on 4/20, monitor for trend 15.  Hematuria  String of bowel ordered   LOS: 7 days A FACE TO FACE EVALUATION  WAS PERFORMED  Weronika Birch ALorie Phenix4/20/2021, 8:54 AM

## 2019-07-12 ENCOUNTER — Inpatient Hospital Stay (HOSPITAL_COMMUNITY): Payer: 59 | Admitting: Occupational Therapy

## 2019-07-12 ENCOUNTER — Inpatient Hospital Stay (HOSPITAL_COMMUNITY): Payer: 59

## 2019-07-12 DIAGNOSIS — N3040 Irradiation cystitis without hematuria: Secondary | ICD-10-CM

## 2019-07-12 DIAGNOSIS — N390 Urinary tract infection, site not specified: Secondary | ICD-10-CM

## 2019-07-12 LAB — GLUCOSE, CAPILLARY
Glucose-Capillary: 62 mg/dL — ABNORMAL LOW (ref 70–99)
Glucose-Capillary: 80 mg/dL (ref 70–99)
Glucose-Capillary: 111 mg/dL — ABNORMAL HIGH (ref 70–99)
Glucose-Capillary: 96 mg/dL (ref 70–99)
Glucose-Capillary: 127 mg/dL — ABNORMAL HIGH (ref 70–99)

## 2019-07-12 LAB — BASIC METABOLIC PANEL
Anion gap: 7 (ref 5–15)
BUN: 10 mg/dL (ref 8–23)
CO2: 27 mmol/L (ref 22–32)
Calcium: 8.2 mg/dL — ABNORMAL LOW (ref 8.9–10.3)
Chloride: 100 mmol/L (ref 98–111)
Creatinine, Ser: 0.65 mg/dL (ref 0.44–1.00)
GFR calc Af Amer: >60 mL/min (ref >60)
GFR calc non Af Amer: >60 mL/min (ref >60)
Glucose, Bld: 73 mg/dL (ref 70–99)
Potassium: 4 mmol/L (ref 3.5–5.1)
Sodium: 134 mmol/L — ABNORMAL LOW (ref 135–145)

## 2019-07-12 MED ORDER — NITROFURANTOIN MONOHYD MACRO 100 MG PO CAPS
100.0000 mg | ORAL_CAPSULE | Freq: Two times a day (BID) | ORAL | Status: AC
  Administered 2019-07-12 – 2019-07-18 (×14): 100 mg via ORAL
  Filled 2019-07-12 (×14): qty 1

## 2019-07-12 NOTE — Progress Notes (Signed)
Orthopedic Tech Progress Note Patient Details:  Brittany Russo [birth year] [id number] Called in order to [facility] for an AKA RETENTION SOCK. Patient ID: [name], female   DOB: 11/24/1945, 74 y.o.   MRN: [id number]   Janit Pagan 07/12/2019, 8:27 AM

## 2019-07-12 NOTE — Progress Notes (Signed)
PHYSICAL MEDICINE & REHABILITATION PROGRESS NOTE  Subjective/Complaints: Patient seen laying in bed this AM.  She states she slept well overnight and is waiting to get ready for the day.  She was noted to have hematuria.   ROS: Denies CP, SOB, N/V/D  Objective: Vital Signs: Blood pressure 138/65, pulse 82, temperature 98.8 F (37.1 C), temperature source Oral, resp. rate 18, height 5' 5"  (1.651 m), weight 73.6 kg, SpO2 94 %. No results found. Recent Labs    07/10/19 0538  WBC 5.8  HGB 8.4*  HCT 26.5*  PLT 82*   Recent Labs    07/10/19 0538 07/12/19 0533  NA 131* 134*  K 4.1 4.0  CL 97* 100  CO2 24 27  GLUCOSE 92 73  BUN 9 10  CREATININE 0.64 0.65  CALCIUM 8.4* 8.2*    Physical Exam: BP 138/65 (BP Location: Right Arm)   Pulse 82   Temp 98.8 F (37.1 C) (Oral)   Resp 18   Ht 5' 5"  (1.651 m)   Wt 73.6 kg   SpO2 94%   BMI 27.00 kg/m  Constitutional: No distress . Vital signs reviewed. HENT: Normocephalic.  Atraumatic. Eyes: EOMI. No discharge. Cardiovascular: No JVD. Respiratory: Normal effort.  No stridor. GI: Non-distended. Skin: Scattered bruising Right AKA with dressing C/D/I (recently dressed) Psych: Normal mood.  Normal behavior. Musc: Right AKA with edema and tenderness, improving Neuro: Alert Upper extremity tremors (baseline per patient),?resolved Motor:  Left lower extremity: Hip flexion, knee extension 4 -/5, ankle dorsiflexion tight heelcord, but able to dorsiflex Right lower extremity: Hip flexion 4/5 (some pain inhibition), unchanged  Assessment/Plan: 1. Functional deficits secondary to right AKA which require 3+ hours per day of interdisciplinary therapy in a comprehensive inpatient rehab setting.  Physiatrist is providing close team supervision and 24 hour management of active medical problems listed below.  Physiatrist and rehab team continue to assess barriers to discharge/monitor patient progress toward functional and  medical goals  Care Tool:  Bathing    Body parts bathed by patient: Right arm, Left arm, Chest, Abdomen, Front perineal area, Right upper leg, Left upper leg, Left lower leg, Face   Body parts bathed by helper: Buttocks Body parts n/a: Right lower leg   Bathing assist Assist Level: Moderate Assistance - Patient 50 - 74%     Upper Body Dressing/Undressing Upper body dressing   What is the patient wearing?: Pull over shirt    Upper body assist Assist Level: Set up assist    Lower Body Dressing/Undressing Lower body dressing      What is the patient wearing?: Pants     Lower body assist Assist for lower body dressing: Moderate Assistance - Patient 50 - 74%     Toileting Toileting    Toileting assist Assist for toileting: Maximal Assistance - Patient 25 - 49% Assistive Device Comment: BSC in bathroom   Transfers Chair/bed transfer  Transfers assist  Chair/bed transfer activity did not occur: Safety/medical concerns  Chair/bed transfer assist level: Minimal Assistance - Patient > 75%     Locomotion Ambulation   Ambulation assist   Ambulation activity did not occur: Safety/medical concerns          Walk 10 feet activity   Assist  Walk 10 feet activity did not occur: Safety/medical concerns        Walk 50 feet activity   Assist Walk 50 feet with 2 turns activity did not occur: Safety/medical concerns  Walk 150 feet activity   Assist Walk 150 feet activity did not occur: Safety/medical concerns         Walk 10 feet on uneven surface  activity   Assist Walk 10 feet on uneven surfaces activity did not occur: Safety/medical concerns         Wheelchair     Assist Will patient use wheelchair at discharge?: Yes Type of Wheelchair: Manual    Wheelchair assist level: Supervision/Verbal cueing Max wheelchair distance: 182f    Wheelchair 50 feet with 2 turns activity    Assist        Assist Level:  Supervision/Verbal cueing   Wheelchair 150 feet activity     Assist     Assist Level: Supervision/Verbal cueing      Medical Problem List and Plan: 1.  Impaired function, ADLs and mobility secondary to R AKA due to failed TKRs  Continue CIR  Team conference today to discuss current and goals and coordination of care, home and environmental barriers, and discharge planning with nursing, case manager, and therapies.  2.  Antithrombotics: -DVT/anticoagulation:  Cannot do lovenox due to low platelets             -antiplatelet therapy: N/A 3. Pain Management: Tramadol 50 mg qid with robaxin/Hydrocodone prn  Phantom limb pain    Gabapentin 100 BID started on 4/13, changed to 300 mg nightly on 4/15, changed to twice daily on 4/19, tolerating  Controlled with meds on 4/21 4. Mood: LCSW to follow for evaluation and support.              -antipsychotic agents: N/A 5. Neuropsych: This patient is ?fully capable of making decisions on her own behalf.  Discussed admission with neuropsych, appreciate evaluation and recs 6. Skin/Wound Care: Monitor wound for healing once surgical dressing removed.   ABD pad, with Kerlix and Ace dressings to AKA. 7. Fluids/Electrolytes/Nutrition: Monitor I/Os 8. Cirrhosis of the liver: Continue Xifaxan.  Hyperammonemia   Lactulose started on 4/14, stopped due to loose stools on 4/18 9. ABLA:   Hemoglobin 8.4 on 4/19  Continue to monitor 10. Thrombocytopenia: Monitor for signs of bleeding. Baseline at 74-->51.   Platelets 82 on 4/19  Continue to monitor 11. Chronic hyponatremia: Continue to monitor.   Sodium 134 on 4/21  Continue to monitor 12. T2DM with hyperglycemia: Hgb A1c-5.2. Continue glucotrol, trajenta, metformin   Lantus 20 units daily, decreased to 15 units on 4/18  Slightly labile on 4/21 CBG (last 3)  Recent Labs    07/11/19 2057 07/12/19 0614 07/12/19 0635  GLUCAP 127* 62* 80    Monitor with increased mobility 13. H/o depression: ON  Lexapro, Xanax and Wellbutrin. 14.  Labile blood pressure  Slightly labile on 4/21, monitor for trend 15.  Radiation cyctitis  Discussed with Urology - seen as outpatient for hematuria - monitor for clots.  No other intervention at present 16. Acute lower UTI  UA +, Ucx pending  Empiric Macrobid started  LOS: 8 days [name]4/21/2021, 8:18 AM

## 2019-07-12 NOTE — Progress Notes (Signed)
Occupational Therapy Weekly Progress Note  Patient Details  Name: Brittany Russo MRN: [id number] Date of Birth: 1945/10/12  Beginning of progress report period: July 05, 2019 End of progress report period: July 12, 2019  Today's Date: 07/12/2019 OT Individual Time:1034-1129 and  1400-1457 OT Individual Time Calculation (min): 55 min and 57 min    Patient has met 2 of 3 short term goals.  Brittany Russo is making slow progress towards goals due to fatigue and anxiety and fearfulness with movement.  Pt is currently able to complete UB dressing at bed level with setup and mod-max assist for LB dressing due to difficulty reaching towards Lt foot to thread pants and then fearfulness of pain when threading RLE.  Pt is able to complete level transfers with slide board CGA to min assist, however as she fatigues and with increased challenge/incline pt continues to require up to Max assist.  Pt continues to demonstrate difficulty with motor planning lateral leans for slide board placement and for clothing management with hygiene post toileting.  Pt requires up to Max assist for toileting tasks as she fatigues quickly.  Pt will continue to benefit from OT services to continue to address functional transfers and lateral leans vs sit > stand for toileting needs.   Patient continues to demonstrate the following deficits: muscle weakness, decreased cardiorespiratoy endurance and decreased sitting balance, decreased standing balance and decreased balance strategies and pain and therefore will continue to benefit from skilled OT intervention to enhance overall performance with BADL and Reduce care partner burden.  Patient progressing toward long term goals..  Continue plan of care.  OT Short Term Goals Week 1:  OT Short Term Goal 1 (Week 1): Pt will complete toilet transfers to drop arm BSC with mod assist OT Short Term Goal 1 - Progress (Week 1): Met OT Short Term Goal 2 (Week 1): Pt will complete LB dressing with mod  assist with lateral leans vs sit > stand OT Short Term Goal 2 - Progress (Week 1): Met OT Short Term Goal 3 (Week 1): Pt will complete toileting tasks with mod assist OT Short Term Goal 3 - Progress (Week 1): Progressing toward goal Week 2:  OT Short Term Goal 1 (Week 2): STG = LTGs due to remaining LOS  Skilled Therapeutic Interventions/Progress Updates:    1) Treatment session with focus on toilet transfers and toileting.  Pt received upright in w/c already dressed.  Engaged in discussion regarding typical toileting routine. Pt reports due to urgency, she typically uses bedpan.  However occasionally pt will complete sit > stand at grab bar in bathroom and family member will place Brittany Russo behind her.  Engaged in blocked practice sit > stand x2 at grab bar in bathroom with mod assist.  Pt reports need to toilet. Due to instability in standing, had 2nd person move w/c and place Brittany Russo behind pt.  Pt required assistance with clothing management and hygiene post BM.  Max assist sit > stand post toileting due to fatigue.  Returned to sitting in w/c.  Discussed plan to attempt slide board transfers for toileting during next therapy session as sit > stand for toileting was so challenging.  Pt in agreement to attempt alternative techniques.  Pt completed grooming tasks in w/c.  Pt remained upright in w/c with seat belt alarm on and all needs in reach.  2) Treatment session with focus on functional transfers with slide board. Pt received upright in w/c reporting having toileted with nursing staff prior to  session and completing sit > stand at bar with +2 (as above).  Discussed recommendation for use of slide board to drop arm BSC for increased safety and independence.  Completed transfer w/c > bed with slide board min assist.  Setup wide drop arm BSC next to bed and educated on transfer technique.  Pt able to complete transfer to Lt min assist, after therapist positioned slide board.  Discussed options for lateral leans  vs boosting up to partial stand for clothing management.  Plan to further address lateral leans for clothing management, with use of boosting as needed.  This therapist does not recommend pt complete full stand for clothing management at this time, and only if truly necessary upon d/c.  Pt able to boost to allow therapist to adjust clothing.  Transferred BSC back to bed with setup for slide board and mod cues for weight shifting as pt continues to lean backwards during transfer therefore requiring increased assist during transfer, up to mod assist when transferring back to bed.  Pt returned to semi-reclined in bed due to fatigue.  Engaged in discussion regarding care for residual limb, to include limb inspection with inspection mirror, limb wrapping, desensitization strategies, and pain management.  Provided pt with handouts of above and issued inspection mirror.  Pt remained semi-reclined in bed with all needs in reach.  Therapy Documentation Precautions:  Precautions Precautions: Fall Precaution Comments: R AKA, like to wear her L AFO/shoe Restrictions Weight Bearing Restrictions: Yes RLE Weight Bearing: Non weight bearing General:   Vital Signs: Therapy Vitals Temp: 98.8 F (37.1 C) Temp Source: Oral Pulse Rate: 82 Resp: 18 BP: 138/65 Patient Position (if appropriate): Lying Oxygen Therapy SpO2: 94 % O2 Device: Room Air Pain: Pain Assessment Pain Scale: 0-10 Pain Score: 0-No pain   Therapy/Group: Individual Therapy  Brittany Russo 07/12/2019, 7:29 AM

## 2019-07-12 NOTE — Progress Notes (Signed)
Hypoglycemic Event  CBG: 62 @ 0615   Treatment: 16 oz of orange juice and few bites of graham crackers and peanut butter. She is now currently eating breakfast.  Symptoms: None  Follow-up CBG: Time: 0635   CBG Result:80  Possible Reasons for Event: Pt did not eat snack.  Comments/MD notified: Will notify upon next shift coming on.    Brittany Russo A [employer]

## 2019-07-12 NOTE — Progress Notes (Signed)
Physical Therapy Weekly Progress Note  Patient Details  Name: Brittany Russo MRN: [id number] Date of Birth: 26-Apr-1945  Beginning of progress report period: July 05, 2019 End of progress report period: July 12, 2019  Today's Date: 07/12/2019 PT Individual Time: 0800-0913 PT Individual Time Calculation (min): 73 min   Patient has met 4 of 4 short term goals. Pt demonstrates improvements in functional mobility/transfers, UE/LE strength, transfers, and improved endurance. Pt currently requires min A for bed mobility with HOB elevated, min/mod A to transfer bed<>chair via slideboard, mod A to transfer sit<>stand in parallel bars, and supervision for WC mobility 18f. Pt recently received retention sock that will assist with dressings and ace wraps falling off during therapy sessions. Family education set for early next week.   Patient continues to demonstrate the following deficits muscle weakness and decreased sitting balance, decreased standing balance, decreased postural control and decreased balance strategies and therefore will continue to benefit from skilled PT intervention to increase functional independence with mobility.  Patient progressing toward long term goals..  Continue plan of care.  PT Short Term Goals Week 1:  PT Short Term Goal 1 (Week 1): Pt will initiate standing training PT Short Term Goal 1 - Progress (Week 1): Met PT Short Term Goal 2 (Week 1): Pt will transfer bed<>chair w/ min assist PT Short Term Goal 2 - Progress (Week 1): Met PT Short Term Goal 3 (Week 1): Pt will perform bed mobility w/ min assist PT Short Term Goal 3 - Progress (Week 1): Met PT Short Term Goal 4 (Week 1): Pt will maintain dynamic sitting balance w/ supervision PT Short Term Goal 4 - Progress (Week 1): Met Week 2:  PT Short Term Goal 1 (Week 2): STG=LTG due to LOS  Skilled Therapeutic Interventions/Progress Updates:  Ambulation/gait training;Balance/vestibular training;Community  reintegration;Discharge planning;Disease management/prevention;DME/adaptive equipment instruction;Neuromuscular re-education;Functional mobility training;Pain management;Patient/family education;Skin care/wound management;Psychosocial support;Splinting/orthotics;Therapeutic Exercise;Therapeutic Activities;UE/LE Strength taining/ROM;Wheelchair propulsion/positioning   Today's Interventions: Received pt supine in bed, pt agreeable to therapy, and denied any pain during session. Session focused on functional mobility/transfers, UE/LE strength, dynamic sitting/standing balance/coordination, amputee education/limb wrapping, and improved activity tolerance. Therapist re-wrapped pt's R residual limb. Upon doffing dressing noted bleeding and soiled dressing. RN made aware and present to inspect limb and administer medication. Therapist applied abdominal pad, gauze bandage, and ace wrapped pt's residual limb with total assist. Pt donned bra with min A, pull over shirt with set up assist, ted hose with total assist, and pants with mod A to pull over hips. Pt transferred supine<>sitting EOB from flat bed with min A and verbal cues for logroll technique. Donned L brace/AFO with total assist with pt sitting EOB. Pt transferred bed<>WC via slideboard with mod A and verbal cues for anterior weight shifting, head/hips relationship, and hand placement on board. [facility] Therapist applied hair spray total assist and pt combed hair seated in WC with supervision. Pt performed WC mobility 728fusing bilateral UEs and supervision to ortho gym. Pt performed simulated car transfer with SB mod A with same verbal cues above for technique. After transferring into car, noticed pt's new wrapping came off and was soiled with blood. Temporarily wrapped R residual limb and transferred out of car mod A with SB and transported back to room total assist. Pt transferred WC<>bed and bed<>WC via SB x 2  additional trials with min/mod A and sit<>supine and supine<>sit with min A. Therapist re-wrapped pts residual limb with same  technique listed above. Concluded session with pt sitting in WC, needs within reach, and seatbelt alarm on.   Therapy Documentation Precautions:  Precautions Precautions: Fall Precaution Comments: R AKA, like to wear her L AFO/shoe Restrictions Weight Bearing Restrictions: Yes RLE Weight Bearing: Non weight bearing  Therapy/Group: Individual Therapy  Alfonse Alpers PT, DPT  07/12/2019, 7:28 AM

## 2019-07-12 NOTE — Plan of Care (Signed)
  Problem: RH Toileting Goal: LTG Patient will perform toileting task (3/3 steps) with assistance level (OT) Description: LTG: Patient will perform toileting task (3/3 steps) with assistance level (OT)  Flowsheets (Taken 07/12/2019 1148) LTG: Pt will perform toileting task (3/3 steps) with assistance level: (downgraded due to instability in standing) Moderate Assistance - Patient 50 - 74% Note: Downgraded due to slow progress and sitting/standing balance   Problem: RH Toilet Transfers Goal: LTG Patient will perform toilet transfers w/assist (OT) Description: LTG: Patient will perform toilet transfers with assist, with/without cues using equipment (OT) Flowsheets (Taken 07/12/2019 1148) LTG: Pt will perform toilet transfers with assistance level of: (downgraded due to slow progress) Moderate Assistance - Patient 50 - 74% Note: Downgraded due to slow progress

## 2019-07-12 NOTE — Patient Care Conference (Signed)
Inpatient RehabilitationTeam Conference and Plan of Care Update Date: 07/12/2019   Time: 11:50 AM    Patient Name: Parker Record Number: [id number]  Date of Birth: 26-Mar-1945 Sex: Female         Room/Bed: 4M13C/4M13C-01 Payor Info: Payor: MEDICARE / Plan: MEDICARE PART A AND B / Product Type: *No Product type* /    Admit Date/Time:  07/04/2019  1:58 PM  Primary Diagnosis:  Amputation above knee [facility])  Patient Active Problem List   Diagnosis Date Noted  . Acute lower UTI   . Radiation cystitis   . Labile blood pressure   . Labile blood glucose   . Anxiety state   . Controlled type 2 diabetes mellitus with hyperglycemia, with long-term current use of insulin ([place])   . Hyponatremia   . Thrombocytopenia ([place])   . Phantom limb pain ([place])   . Postoperative pain   . Amputation above knee ([place]) 07/04/2019  . S/P right knee disarticulation amputation 06/29/2019  . Above knee amputation of right lower extremity ([place]) 06/29/2019  . Hyperglycemia   . Failed total right knee replacement ([place])   . Hepatic encephalopathy ([place]) 12/28/2018  . UTI (urinary tract infection) 12/28/2018  . Bradycardia 12/28/2018  . Hyperkalemia 12/28/2018  . Hematuria 11/23/2018  . Overweight (BMI 25.0-29.9) 11/23/2018  . Mechanical problem with extremity 11/22/2018  . Cirrhosis of liver with ascites ([place]) 11/09/2018  . Nausea without vomiting 10/26/2018  . Ascites   . Rectal bleeding   . AKI (acute kidney injury) ([place]) 10/11/2018  . Lower GI bleed 10/10/2018  . Anasarca 10/10/2018  . Hypoalbuminemia 10/10/2018  . Depression 09/28/2018  . Acute hepatic encephalopathy 09/28/2018  . Cirrhosis ([place]) 09/28/2018  . Periprosthetic fracture around internal prosthetic right knee joint 09/24/2018  . S/P right TKA 09/22/2018  . Status post total knee replacement, right 09/22/2018  . DM2 (diabetes mellitus, type 2) ([place]) 07/11/2015  . Hypertension 07/11/2015  . Hyperlipidemia 07/11/2015     Expected Discharge Date: Expected Discharge Date: 07/19/19  Team Members Present: Physician leading conference: Dr. Delice Lesch Care Coodinator Present: Karene Fry, RN, MSN;Christina Sampson Goon, BSW;Deborah Tobin Chad, RN, BSN, [place] Nurse Present: Other (comment)(Amanda Archie, RN) PT Present: Becky Sax, PT OT Present: Simonne Come, OT SLP Present: Colon Flattery, SLP PPS Coordinator present : Ileana Ladd, PT     Current Status/Progress Goal Weekly Team Focus  Bowel/Bladder   Continent of bowel and bladder  To remain continent  Assist to bathroom as needed   Swallow/Nutrition/ Hydration             ADL's   Min A bathing, Mod A LB dressing, Min A UB dressing and CGA sitting balance, Min-mod A slide board transfers  Min A overall, Supervision UB dressing and grooming  ADL retraining, dynamic sitting balance, transfers, activity tolerance   Mobility   bed mobility min/mod A, slideboard transfers mod A, supervision WC mobility  CGA transfers, mod I w/c mobility, max A sit<>stand  functional mobility/transfers, amputee education, dynamic sitting/standing balance, endurance, energy conservation strategies   Communication             Safety/Cognition/ Behavioral Observations            Pain   Complaints of phantom pain in right stump (9/10); gets scheduled tramadol  To lessen pain level  Assess pain q shift or as needed.   Skin   Has AKA to right leg; Compression dressing/ace wrap  Keep preventing  skin breakdown and prevent infection to right stump  Assess skin q shift    Rehab Goals Patient on target to meet rehab goals: Yes *See Care Plan and progress notes for long and short-term goals.     Barriers to Discharge  Current Status/Progress Possible Resolutions Date Resolved   Nursing                  PT  Wound Care;Behavior  severe anxiety, fear of falling              OT                  SLP                SW Wound Care   Has a hospital bed, over bed tray, ramped  entrance, slideboard, w/c, walker and BSC, shower bench and CNA 3 hours/day5x/week          Discharge Planning/Teaching Needs:  Home with spouse and daughters and CNA 10-1p 5x/week  Wound care, transfers, toileting, medications, etc   Team Discussion: MD phantom limb pain improved, cognition improved, monitoring labs, BS up/down, adjustments made, UTI, med started, monitoring hematuria.  RN A+O, BM today, BS 62, OJ given, refusing lantus, drsg change done, has drainage, meds whole staples okay.  OT min A bathing, fatigues quickly, toilet +2 assist, self care a lot of assist, min a goals, mod A toilet goals.  PT min bed, transfers min/mod, car transfers heavy mod A, mod/max sit to stand, anxiety.  Need fam ed 4/26.   Revisions to Treatment Plan: N/A     Medical Summary Current Status: Impaired function, ADLs and mobility secondary to R AKA due to failed TKRs Weekly Focus/Goal: Improve mobility, phantom limb pain, CBGs, BP, UTI  Barriers to Discharge: Medical stability;Weight;Wound care;Weight bearing restrictions  Barriers to Discharge Comments: Patient limb pain, radiation cystitis Possible Resolutions to Barriers: Therapies, optimize pain meds, abx for UTI, follow hematuria, otpimize DM meds, follow labs   Continued Need for Acute Rehabilitation Level of Care: The patient requires daily medical management by a physician with specialized training in physical medicine and rehabilitation for the following reasons: Direction of a multidisciplinary physical rehabilitation program to maximize functional independence : Yes Medical management of patient stability for increased activity during participation in an intensive rehabilitation regime.: Yes Analysis of laboratory values and/or radiology reports with any subsequent need for medication adjustment and/or medical intervention. : Yes   I attest that I was present, lead the team conference, and concur with the assessment and plan of the  team.   Retta Diones 07/12/2019, 10:45 PM   Team conference was held via web/ teleconference due to [place] - 19

## 2019-07-13 ENCOUNTER — Inpatient Hospital Stay (HOSPITAL_COMMUNITY): Payer: Medicare Other

## 2019-07-13 LAB — GLUCOSE, CAPILLARY
Glucose-Capillary: 84 mg/dL (ref 70–99)
Glucose-Capillary: 118 mg/dL — ABNORMAL HIGH (ref 70–99)
Glucose-Capillary: 59 mg/dL — ABNORMAL LOW (ref 70–99)
Glucose-Capillary: 104 mg/dL — ABNORMAL HIGH (ref 70–99)
Glucose-Capillary: 164 mg/dL — ABNORMAL HIGH (ref 70–99)

## 2019-07-13 MED ORDER — LIP MEDEX EX OINT: TOPICAL_OINTMENT | CUTANEOUS | 0 refills | Status: DC | PRN

## 2019-07-13 MED ORDER — ACETAMINOPHEN 325 MG PO TABS: 325.0000 mg | ORAL_TABLET | ORAL | Status: AC | PRN

## 2019-07-13 NOTE — Progress Notes (Addendum)
[place] PHYSICAL MEDICINE & REHABILITATION PROGRESS NOTE  Subjective/Complaints: Patient seen sitting up in bed this AM.  She states she slept well overnight.  She is aware of her discharge date.   ROS: Denies CP, SOB, N/V/D  Objective: Vital Signs: Blood pressure 125/77, pulse 79, temperature 97.7 F (36.5 C), temperature source Oral, resp. rate 16, height 5' 5"  (1.651 m), weight 73.6 kg, SpO2 97 %. No results found. No results for input(s): WBC, HGB, HCT, PLT in the last 72 hours. Recent Labs    07/12/19 0533  NA 134*  K 4.0  CL 100  CO2 27  GLUCOSE 73  BUN 10  CREATININE 0.65  CALCIUM 8.2*    Physical Exam: BP 125/77 (BP Location: Right Arm)   Pulse 79   Temp 97.7 F (36.5 C) (Oral)   Resp 16   Ht 5' 5"  (1.651 m)   Wt 73.6 kg   SpO2 97%   BMI 27.00 kg/m  Constitutional: No distress . Vital signs reviewed. HENT: Normocephalic.  Atraumatic. Eyes: EOMI. No discharge. Cardiovascular: No JVD. Respiratory: Normal effort.  No stridor. GI: Non-distended. Skin: Scattered bruising Right AKA with dressing C/D/I Psych: Normal mood.  Normal behavior. Musc: Right AKA with edema and tenderness, improving Neuro: Alert and oriented x3 Upper extremity tremors (intermittent baseline per patient), no tremors today Motor:  Left lower extremity: Hip flexion, knee extension 4 -/5, ankle dorsiflexion tight heelcord, but able to dorsiflex Right lower extremity: Hip flexion 4/5 (some pain inhibition), unchanged  Assessment/Plan: 1. Functional deficits secondary to right AKA which require 3+ hours per day of interdisciplinary therapy in a comprehensive inpatient rehab setting.  Physiatrist is providing close team supervision and 24 hour management of active medical problems listed below.  Physiatrist and rehab team continue to assess barriers to discharge/monitor patient progress toward functional and medical goals  Care Tool:  Bathing    Body parts bathed by patient:  Right arm, Left arm, Chest, Abdomen, Front perineal area, Right upper leg, Left upper leg, Left lower leg, Face   Body parts bathed by helper: Buttocks Body parts n/a: Right lower leg   Bathing assist Assist Level: Moderate Assistance - Patient 50 - 74%     Upper Body Dressing/Undressing Upper body dressing   What is the patient wearing?: Pull over shirt    Upper body assist Assist Level: Set up assist    Lower Body Dressing/Undressing Lower body dressing      What is the patient wearing?: Pants     Lower body assist Assist for lower body dressing: Moderate Assistance - Patient 50 - 74%     Toileting Toileting    Toileting assist Assist for toileting: Total Assistance - Patient < 25% Assistive Device Comment: BSC in bathroom   Transfers Chair/bed transfer  Transfers assist  Chair/bed transfer activity did not occur: Safety/medical concerns  Chair/bed transfer assist level: Moderate Assistance - Patient 50 - 74%     Locomotion Ambulation   Ambulation assist   Ambulation activity did not occur: Safety/medical concerns          Walk 10 feet activity   Assist  Walk 10 feet activity did not occur: Safety/medical concerns        Walk 50 feet activity   Assist Walk 50 feet with 2 turns activity did not occur: Safety/medical concerns         Walk 150 feet activity   Assist Walk 150 feet activity did not occur: Safety/medical concerns  Walk 10 feet on uneven surface  activity   Assist Walk 10 feet on uneven surfaces activity did not occur: Safety/medical concerns         Wheelchair     Assist Will patient use wheelchair at discharge?: Yes Type of Wheelchair: Manual    Wheelchair assist level: Supervision/Verbal cueing Max wheelchair distance: 3f    Wheelchair 50 feet with 2 turns activity    Assist        Assist Level: Supervision/Verbal cueing   Wheelchair 150 feet activity     Assist     Assist  Level: Supervision/Verbal cueing      Medical Problem List and Plan: 1.  Impaired function, ADLs and mobility secondary to R AKA due to failed TKRs  Continue CIR 2.  Antithrombotics: -DVT/anticoagulation:  Cannot do lovenox due to low platelets             -antiplatelet therapy: N/A 3. Pain Management: Tramadol 50 mg qid with robaxin/Hydrocodone prn  Phantom limb pain    Gabapentin 100 BID started on 4/13, changed to 300 mg nightly on 4/15, changed to twice daily on 4/19, tolerating  Controlled with meds on 4/22 4. Mood: LCSW to follow for evaluation and support.              -antipsychotic agents: N/A 5. Neuropsych: This patient is ?fully capable of making decisions on her own behalf.  Discussed admission with neuropsych, appreciate evaluation and recs 6. Skin/Wound Care: Monitor wound for healing once surgical dressing removed.   ABD pad, with Kerlix and Ace dressings to AKA. 7. Fluids/Electrolytes/Nutrition: Monitor I/Os 8. Cirrhosis of the liver: Continue Xifaxan.  Hyperammonemia - clinically improving   Lactulose started on 4/14, stopped due to loose stools on 4/18 9. ABLA:   Hemoglobin 8.4 on 4/19  Continue to monitor 10. Thrombocytopenia: Monitor for signs of bleeding. Baseline at 74-->51.   Platelets 82 on 4/19, labs ordered for tomorrow  Continue to monitor 11. Chronic hyponatremia: Continue to monitor.   Sodium 134 on 4/21  Continue to monitor 12. T2DM with hyperglycemia: Hgb A1c-5.2. Continue glucotrol, trajenta, metformin   Lantus 20 units daily, decreased to 15 units on 4/18  Relatively controlled on 4/22 CBG (last 3)  Recent Labs    07/12/19 1635 07/12/19 2108 07/13/19 0621  GLUCAP 96 127* 84    Monitor with increased mobility 13. H/o depression: ON Lexapro, Xanax and Wellbutrin. 14.  Labile blood pressure  Relatively controlled on 4/22 15.  Radiation cyctitis  Discussed with Urology - seen as outpatient for hematuria - monitor for clots.  No other  intervention at present 16. Acute lower UTI  UA +, Ucx >100K Gram neg, sensitivities pending  Empiric Macrobid started  LOS: 9 days A FACE TO FACE EVALUATION WAS PERFORMED  Ulla Mckiernan ALorie Phenix4/22/2021, 10:06 AM

## 2019-07-13 NOTE — Progress Notes (Signed)
Occupational Therapy Session Note  Patient Details  Name: Brittany Russo MRN: [id number] Date of Birth: 01/15/1946  Today's Date: 07/13/2019 OT Individual Time: 0900-1000 OT Individual Time Calculation (min): 60 min    Short Term Goals: Week 1:  OT Short Term Goal 1 (Week 1): Pt will complete toilet transfers to drop arm BSC with mod assist OT Short Term Goal 1 - Progress (Week 1): Met OT Short Term Goal 2 (Week 1): Pt will complete LB dressing with mod assist with lateral leans vs sit > stand OT Short Term Goal 2 - Progress (Week 1): Met OT Short Term Goal 3 (Week 1): Pt will complete toileting tasks with mod assist OT Short Term Goal 3 - Progress (Week 1): Progressing toward goal  Skilled Therapeutic Interventions/Progress Updates:    1:1. Pt received in bed with RN present delivering medications. Pt uses reacher to thread pants onto LEs, however requires A to thread LLE as keeps threading into wrong pant leg. Pt rolls with VC for bed rail use to advance pants past hips. Pt completes lateral scoot transfer with MOD A overall and VC for anterior weight shift. OT washes hair with MAX A using hair washing tray and pt able to dry. Pt completes UB dressing and bathing with set up overall. Pt completes w/c push up 2x10 and 1 isometric hold for 1 min. Likely recommend this method for CM at home. Exited session with pt seated in bed, exit alarm on and call light in reach  Session 2: 1100-1140  40 min  Pt received in w/c with no apin reported, just fatigue. Pt completes w/c propulsion to/from all tx destinations with S. Pt completes slide board transfer to/from EOM with MOD A fading to [place] when returning to w/c. Pt completes lateral scooting with mod VC for head hips relationship and stool under foot for 3x length of mat to improve carry over for SBT. Pt completes lateral leans reaching for objects in B directions in prep for CM/LB dresing EOB with good hip clearance on mat. 2x10 sit ups on wedge  performed for core stability/trunk control. Exited session with pt seated in w/c, exit alarm on and call light in reach  Therapy Documentation Precautions:  Precautions Precautions: Fall Precaution Comments: R AKA, like to wear her L AFO/shoe Restrictions Weight Bearing Restrictions: Yes RLE Weight Bearing: Non weight bearing General:   Vital Signs:   Pain:   ADL: ADL Upper Body Bathing: Setup Where Assessed-Upper Body Bathing: Bed level Lower Body Bathing: Moderate assistance Where Assessed-Lower Body Bathing: Bed level Upper Body Dressing: Minimal assistance Where Assessed-Upper Body Dressing: Edge of bed Lower Body Dressing: Dependent Where Assessed-Lower Body Dressing: Bed level Vision   Perception    Praxis   Exercises:   Other Treatments:     Therapy/Group: Individual Therapy  Tonny Branch 07/13/2019, 9:59 AM

## 2019-07-13 NOTE — Progress Notes (Signed)
Physical Therapy Session Note  Patient Details  Name: Brittany Russo MRN: [id number] Date of Birth: 26-Mar-1945  Today's Date: 07/13/2019 PT Individual Time: 1415-1525 PT Individual Time Calculation (min): 70 min   Short Term Goals: Week 1:  PT Short Term Goal 1 (Week 1): Pt will initiate standing training PT Short Term Goal 1 - Progress (Week 1): Met PT Short Term Goal 2 (Week 1): Pt will transfer bed<>chair w/ min assist PT Short Term Goal 2 - Progress (Week 1): Met PT Short Term Goal 3 (Week 1): Pt will perform bed mobility w/ min assist PT Short Term Goal 3 - Progress (Week 1): Met PT Short Term Goal 4 (Week 1): Pt will maintain dynamic sitting balance w/ supervision PT Short Term Goal 4 - Progress (Week 1): Met Week 2:  PT Short Term Goal 1 (Week 2): STG=LTG due to LOS  Skilled Therapeutic Interventions/Progress Updates:   Received pt supine in bed, pt agreeable to therapy, and denied any pain during session. Session focused on functional mobility/transfers, UE/LE strength, dynamic sitting and standing balance/coordination, amputee education, and improved activity tolerance. Pt transferred supine<>sitting EOB with CGA and increased time. Pt transferred bed<>WC via slideboard with mod A and verbal cues for anterior weight shifting, head/hips relationship, and hand placement on board. Pt attempted to transfer sit<>stand with RW x 3 trials however pt with increased anxiety, fear, and trembling and unable to come to complete stand and returned to sitting position for safety. Pt transferred sit<>stand mod A inside parallel bars x 3 trials for approximately 20-30 seconds. Pt reported increased L knee pain and required frequent seated rest breaks. Pt stated she wanted to be able to "hop" on L LE. Educated pt on importance of avoiding hopping due to osteoarthritis and osteoporosis in L limb; pt verbalized understanding. Educated pt on strategies to decrease phantom limb pain including desensitization  techniques and mirror therapy as well as energy conservation strategies. Pt performed the following exercises seated on mat with supervision and verbal cues for technique: -trunk rotations with 4.4lb medicine ball x8 bilaterally -chest press with 4.4lb medicine ball x8 -bicep curls with 3lb dowel 2x13 -overhead chest press to horizontal chest press 2x6 with 3lb dowel  Pt transferred mat<>WC via SB with CGA and therapist blocked L LE from sliding out in front of pt. Pt transported to ortho gym in [facility] total assist. Pt performed bilateral UE strengthening on UBE at level 2.5 for 2 minutes and 45 seconds forward only with supervision. Pt transported back to room in [facility] total assist. Concluded session with pt sitting in WC, needs within reach, and seatbelt alarm on.   Therapy Documentation Precautions:  Precautions Precautions: Fall Precaution Comments: R AKA, like to wear her L AFO/shoe Restrictions Weight Bearing Restrictions: Yes RLE Weight Bearing: Non weight bearing  Therapy/Group: Individual Therapy Alfonse Alpers PT, DPT   07/13/2019, 7:31 AM

## 2019-07-13 NOTE — Progress Notes (Signed)
CBG was 59*, Patient asymptotic, requested grape juice, crackers and peanut butter. Patient sugar rechecked after snack with CBG at 104.  Patient currently eating dinner

## 2019-07-13 NOTE — Progress Notes (Signed)
Physical Therapy Session Note  Patient Details  Name: Brittany Russo MRN: [id number] Date of Birth: 12-02-1945  Today's Date: 07/13/2019 PT Individual Time: 5449-2010 PT Individual Time Calculation (min): 44 min   Short Term Goals: Week 1:  PT Short Term Goal 1 (Week 1): Pt will initiate standing training PT Short Term Goal 1 - Progress (Week 1): Met PT Short Term Goal 2 (Week 1): Pt will transfer bed<>chair w/ min assist PT Short Term Goal 2 - Progress (Week 1): Met PT Short Term Goal 3 (Week 1): Pt will perform bed mobility w/ min assist PT Short Term Goal 3 - Progress (Week 1): Met PT Short Term Goal 4 (Week 1): Pt will maintain dynamic sitting balance w/ supervision PT Short Term Goal 4 - Progress (Week 1): Met Week 2:  PT Short Term Goal 1 (Week 2): STG=LTG due to LOS  Skilled Therapeutic Interventions/Progress Updates:    PAIN declines pain but c/o discomfort of wet clothing due to drainage.  Pt initially OOB in wc stating that her pants were wet from drainage from her R AKA dressing.  WC to bed SBT w/set up and min assist plus cues for ant wt shifting, assurance due to apparent anxiety w/transfer.   Sit to supine mod I.  Pt rolls L and R fully using rails only while therapist removed pants,brief,  limb sock, acewrap, and dressings which were heavily soiled.  Pt also requesting new brief. Therapist noted mild drainage from AKA of lightly blood tinged drainage.  ABD applied plus kerlix followed by new acewrap and clean limb sock.  Instructed pt w/figure 8 acewrapping of limb during this activity.  Rolls for positioning of clean brief and for cleaning of perineum by therapist. P Pt rolls L/R mod I for donning of pants by therapist, rolls and completes raising pants w/min assist.    Therex: Hip abd/add x 20 Pt left supine w/rails up x 3, alarm set, bed in lowest position, and needs in reach.    Therapy Documentation Precautions:  Precautions Precautions: Fall Precaution  Comments: R AKA, like to wear her L AFO/shoe Restrictions Weight Bearing Restrictions: Yes RLE Weight Bearing: Non weight bearing   Therapy/Group: Individual Therapy  Callie Fielding, [place] 07/13/2019, 4:16 PM

## 2019-07-13 NOTE — Progress Notes (Signed)
Team Conference Report to Patient/Family  Team Conference discussion was reviewed with the patient and spouse on 07/12/19 and daughter on 07/13/19, including goals, any changes in plan of care and target discharge date.  Patient, spouse and daughter expressed understanding and are in agreement.  The patient has a target discharge date of 07/19/19. Family education set up for 07/17/19 @ 1 p.m.Reviewed request for medications to be called into pharmacy; [facility] [phone]. Daughter noted therapy recommended a DA BSC and patient had just obtained a regular BSC in December; so we are checking on access to the DA commode. Also noted secure clip on left arm rest is broken and wondering if it could be fixed or replaced? Forwarded DME requests to [place]. Husband also noted transportation home through [employer].  Dorien Chihuahua B 07/13/2019, 8:45 AM

## 2019-07-14 ENCOUNTER — Inpatient Hospital Stay (HOSPITAL_COMMUNITY): Payer: Medicare Other

## 2019-07-14 ENCOUNTER — Inpatient Hospital Stay (HOSPITAL_COMMUNITY): Payer: Medicare Other | Admitting: Occupational Therapy

## 2019-07-14 DIAGNOSIS — Z89611 Acquired absence of right leg above knee: Secondary | ICD-10-CM

## 2019-07-14 LAB — CBC WITH DIFFERENTIAL/PLATELET
Abs Immature Granulocytes: 0.02 10*3/uL (ref 0.00–0.07)
Basophils Absolute: 0 10*3/uL (ref 0.0–0.1)
Basophils Relative: 1 %
Eosinophils Absolute: 0.2 10*3/uL (ref 0.0–0.5)
Eosinophils Relative: 6 %
HCT: 26.6 % — ABNORMAL LOW (ref 36.0–46.0)
Hemoglobin: 8.1 g/dL — ABNORMAL LOW (ref 12.0–15.0)
Immature Granulocytes: 1 %
Lymphocytes Relative: 13 %
Lymphs Abs: 0.5 10*3/uL — ABNORMAL LOW (ref 0.7–4.0)
MCH: 29 pg (ref 26.0–34.0)
MCHC: 30.5 g/dL (ref 30.0–36.0)
MCV: 95.3 fL (ref 80.0–100.0)
Monocytes Absolute: 0.3 10*3/uL (ref 0.1–1.0)
Monocytes Relative: 10 %
Neutro Abs: 2.4 10*3/uL (ref 1.7–7.7)
Neutrophils Relative %: 69 %
Platelets: 76 10*3/uL — ABNORMAL LOW (ref 150–400)
RBC: 2.79 MIL/uL — ABNORMAL LOW (ref 3.87–5.11)
RDW: 18.7 % — ABNORMAL HIGH (ref 11.5–15.5)
WBC: 3.5 10*3/uL — ABNORMAL LOW (ref 4.0–10.5)
nRBC: 0 % (ref 0.0–0.2)

## 2019-07-14 LAB — URINE CULTURE: Culture: >=100000 — AB

## 2019-07-14 LAB — GLUCOSE, CAPILLARY
Glucose-Capillary: 116 mg/dL — ABNORMAL HIGH (ref 70–99)
Glucose-Capillary: 104 mg/dL — ABNORMAL HIGH (ref 70–99)
Glucose-Capillary: 138 mg/dL — ABNORMAL HIGH (ref 70–99)
Glucose-Capillary: 171 mg/dL — ABNORMAL HIGH (ref 70–99)

## 2019-07-14 MED ORDER — LINAGLIPTIN 5 MG PO TABS
5.0000 mg | ORAL_TABLET | Freq: Every day | ORAL | Status: DC
  Administered 2019-07-15 – 2019-07-28 (×14): 5 mg via ORAL
  Filled 2019-07-14 (×14): qty 1

## 2019-07-14 NOTE — Progress Notes (Addendum)
PHYSICAL MEDICINE & REHABILITATION PROGRESS NOTE  Subjective/Complaints: Patient seen sitting up in her chair this morning.  She states she slept well overnight.  She states she is tired this morning but otherwise did not have any complaints.  ROS: Denies CP, SOB, N/V/D  Objective: Vital Signs: Blood pressure (!) 148/67, pulse 81, temperature (!) 97.5 F (36.4 C), temperature source Oral, resp. rate 17, height 5' 5"  (1.651 m), weight 73.6 kg, SpO2 96 %. No results found. Recent Labs    07/14/19 0800  WBC 3.5*  HGB 8.1*  HCT 26.6*  PLT 76*   Recent Labs    07/12/19 0533  NA 134*  K 4.0  CL 100  CO2 27  GLUCOSE 73  BUN 10  CREATININE 0.65  CALCIUM 8.2*    Physical Exam: BP (!) 148/67   Pulse 81   Temp (!) 97.5 F (36.4 C) (Oral)   Resp 17   Ht 5' 5"  (1.651 m)   Wt 73.6 kg   SpO2 96%   BMI 27.00 kg/m  Constitutional: No distress . Vital signs reviewed. HENT: Normocephalic.  Atraumatic. Eyes: EOMI. No discharge. Cardiovascular: No JVD. Respiratory: Normal effort.  No stridor. GI: Non-distended. Skin: Scattered bruising Right AKA with dressing C/D/I Psych: Normal mood.  Normal behavior. Musc: Right lower extremity with edema and tenderness, improving Neuro: Alert and oriented Upper extremity tremors (intermittent baseline per patient) Motor:  Left lower extremity: Hip flexion, knee extension 4 -/5, ankle dorsiflexion tight heelcord, but able to dorsiflex, unchanged Right lower extremity: Hip flexion 4/5 (some pain inhibition), unchanged  Assessment/Plan: 1. Functional deficits secondary to right AKA which require 3+ hours per day of interdisciplinary therapy in a comprehensive inpatient rehab setting.  Physiatrist is providing close team supervision and 24 hour management of active medical problems listed below.  Physiatrist and rehab team continue to assess barriers to discharge/monitor patient progress toward functional and medical  goals  Care Tool:  Bathing    Body parts bathed by patient: Right arm, Left arm, Chest, Abdomen, Front perineal area, Right upper leg, Left upper leg, Left lower leg, Face   Body parts bathed by helper: Buttocks Body parts n/a: Right lower leg   Bathing assist Assist Level: Moderate Assistance - Patient 50 - 74%     Upper Body Dressing/Undressing Upper body dressing   What is the patient wearing?: Pull over shirt    Upper body assist Assist Level: Set up assist    Lower Body Dressing/Undressing Lower body dressing      What is the patient wearing?: Pants     Lower body assist Assist for lower body dressing: Moderate Assistance - Patient 50 - 74%     Toileting Toileting    Toileting assist Assist for toileting: Moderate Assistance - Patient 50 - 74% Assistive Device Comment: BSC in bathroom   Transfers Chair/bed transfer  Transfers assist  Chair/bed transfer activity did not occur: Safety/medical concerns  Chair/bed transfer assist level: Moderate Assistance - Patient 50 - 74%     Locomotion Ambulation   Ambulation assist   Ambulation activity did not occur: Safety/medical concerns          Walk 10 feet activity   Assist  Walk 10 feet activity did not occur: Safety/medical concerns        Walk 50 feet activity   Assist Walk 50 feet with 2 turns activity did not occur: Safety/medical concerns         Walk 150 feet activity  Assist Walk 150 feet activity did not occur: Safety/medical concerns         Walk 10 feet on uneven surface  activity   Assist Walk 10 feet on uneven surfaces activity did not occur: Safety/medical concerns         Wheelchair     Assist Will patient use wheelchair at discharge?: Yes Type of Wheelchair: Manual    Wheelchair assist level: Supervision/Verbal cueing Max wheelchair distance: 136f    Wheelchair 50 feet with 2 turns activity    Assist        Assist Level:  Supervision/Verbal cueing   Wheelchair 150 feet activity     Assist     Assist Level: Supervision/Verbal cueing      Medical Problem List and Plan: 1.  Impaired function, ADLs and mobility secondary to R AKA due to failed TKRs  Continue CIR 2.  Antithrombotics: -DVT/anticoagulation:  Cannot do lovenox due to low platelets             -antiplatelet therapy: N/A 3. Pain Management: Tramadol 50 mg qid with robaxin/Hydrocodone prn  Phantom limb pain    Gabapentin 100 BID started on 4/13, changed to 300 mg nightly on 4/15, changed to twice daily on 4/19, tolerating  Controlled with meds on 4/23 4. Mood: LCSW to follow for evaluation and support.              -antipsychotic agents: N/A 5. Neuropsych: This patient is ?fully capable of making decisions on her own behalf.  Discussed admission with neuropsych, appreciate evaluation and recs 6. Skin/Wound Care: Monitor wound for healing once surgical dressing removed.   ABD pad, with Kerlix and Ace dressings to AKA. 7. Fluids/Electrolytes/Nutrition: Monitor I/Os 8. Cirrhosis of the liver: Continue Xifaxan.  Hyperammonemia - clinically improving   Lactulose started on 4/14, stopped due to loose stools on 4/18 9. ABLA:   Hemoglobin 8.1 on 4/23, labs ordered for Monday  Continue to monitor 10. Thrombocytopenia: Monitor for signs of bleeding. Baseline at 74-->51.   Platelets 76 on 4/20, labs ordered for Monday  Continue to monitor 11. Chronic hyponatremia: Continue to monitor.   Sodium 134 on 4/21  Continue to monitor 12. T2DM with hyperglycemia: Hgb A1c-5.2.   Glucotrol 2.5 twice daily, discussed with pharmacy DC on 4/23  Continue Tradjenta 5 mg daily  Continue metformin   Lantus 20 units daily, decreased to 15 units on 4/18, [place] pharmacy[place]  Labile with hypoglycemia on 4/23 CBG (last 3)  Recent Labs    07/13/19 1737 07/13/19 2048 07/14/19 0514  GLUCAP 104* 164* 116*    Monitor with increased  mobility 13. H/o depression: ON Lexapro, Xanax and Wellbutrin. 14.  Labile blood pressure  Labile on 4/23, monitor trend 15.  Radiation cyctitis  Discussed with Urology - seen as outpatient for hematuria - monitor for clots.  No other intervention at present 16. Acute lower UTI  UA +, Ucx >100K Citrobacter  Continue Macrobid  LOS: 10 days A FACE TO FACE EVALUATION WAS PERFORMED  Nyhla Mountjoy ALorie Phenix4/23/2021, 10:04 AM

## 2019-07-14 NOTE — Progress Notes (Signed)
Physical Therapy Session Note  Patient Details  Name: Brittany Russo MRN: [id number] Date of Birth: [birth year]  Today's Date: 07/14/2019 PT Individual Time: 1100-1200 PT Individual Time Calculation (min): 60 min   Short Term Goals: Week 1:  PT Short Term Goal 1 (Week 1): Pt will initiate standing training PT Short Term Goal 1 - Progress (Week 1): Met PT Short Term Goal 2 (Week 1): Pt will transfer bed<>chair w/ min assist PT Short Term Goal 2 - Progress (Week 1): Met PT Short Term Goal 3 (Week 1): Pt will perform bed mobility w/ min assist PT Short Term Goal 3 - Progress (Week 1): Met PT Short Term Goal 4 (Week 1): Pt will maintain dynamic sitting balance w/ supervision PT Short Term Goal 4 - Progress (Week 1): Met Week 2:  PT Short Term Goal 1 (Week 2): STG=LTG due to LOS Week 3:     Skilled Therapeutic Interventions/Progress Updates:    PAIN Denies pain this am Pt initially supine and mid dressing change w/nursing.  Pt did not have clean stocking due to both being soiled. Discussed w/nurse/nt need to rinse and hang these for pt repeated use when soiled. Therapist completed acewrapping of limb and using acewraps provided waist belt support to prevent dressing from falling off of limb w/mobility.  Completes dressing in supine w/ threading of pants by therapist, rolling and bridgning w/supervision to complete.  Pt agreeable to continued treatment session with focus on standing tolerance/endurance/strength.  wc propulsion x 14f including 2 turns w/supervision for functional mobility training/endurance.  Propels slowly.  Challenging endurance.    STS in parallel bars w/mod assist and cues for ant wt shift w/transition. Stood 4 min x 1 w/cga Stood 2.5 min x 1 w/cga Stand to sit w/min assist due to decreased eccentric control.   Requires several min recovery in sitting between efforts.   D1 PNF pattern using 2lb med ball x 5 each direction for core strengthening - very challenging for  pt. Forward rows w/3lb bar x 15 Forward flexion w/3lb bar x 15  wc propulsion [id number]/supervision to room for functional mobility training/endurance. Pt left oob in wc w/alarm belt set and needs in reach     Therapy Documentation Precautions:  Precautions Precautions: Fall Precaution Comments: R AKA, like to wear her L AFO/shoe Restrictions Weight Bearing Restrictions: Yes RLE Weight Bearing: Non weight bearing    Therapy/Group: Individual Therapy  BaCallie FieldingPTJenkinsburg/23/2021, 12:08 PM

## 2019-07-14 NOTE — Plan of Care (Signed)
  Problem: RH Dressing Goal: LTG Patient will perform lower body dressing w/assist (OT) Description: LTG: Patient will perform lower body dressing with assist, with/without cues in positioning using equipment (OT) Flowsheets (Taken 07/14/2019 1533) LTG: Pt will perform lower body dressing with assistance level of: (downgraded) Moderate Assistance - Patient 50 - 74% Note: Downgraded as pt will require assistance with donning residual limb shrinker and shoe with AFO.     Problem: RH Toileting Goal: LTG Patient will perform toileting task (3/3 steps) with assistance level (OT) Description: LTG: Patient will perform toileting task (3/3 steps) with assistance level (OT)  Flowsheets (Taken 07/14/2019 1534) LTG: Pt will perform toileting task (3/3 steps) with assistance level: (downgraded) Maximal Assistance - Patient 25 - 49% Note: Downgraded as pt will require assistance with clothing management

## 2019-07-14 NOTE — Progress Notes (Signed)
Occupational Therapy Session Note  Patient Details  Name: Brittany Russo MRN: [id number] Date of Birth: 04-Feb-1946  Today's Date: 07/14/2019 OT Individual Time: 0802-0912 OT Individual Time Calculation (min): 70 min   OT Individual Time: 1435-1516 OT Individual Time Calculation (min): 41 min   Short Term Goals: Week 1:  OT Short Term Goal 1 (Week 1): Pt will complete toilet transfers to drop arm BSC with mod assist OT Short Term Goal 1 - Progress (Week 1): Met OT Short Term Goal 2 (Week 1): Pt will complete LB dressing with mod assist with lateral leans vs sit > stand OT Short Term Goal 2 - Progress (Week 1): Met OT Short Term Goal 3 (Week 1): Pt will complete toileting tasks with mod assist OT Short Term Goal 3 - Progress (Week 1): Progressing toward goal Week 2:  OT Short Term Goal 1 (Week 2): STG = LTGs due to remaining LOS  Skilled Therapeutic Interventions/Progress Updates:  Session 1: Patient met lying supine in bed in agreement with OT treatment session. Patient notes donning UB clothing prior to OT's entry. LB dressing in supine with Mod A, use of reacher and cueing as patient donned LLE into RLE pant leg. Rolling R<>L with use of bed rails and supervision A. Supine to EOB with CGA and lateral scoot to L with Min A. SB transfer from EOB to wc with set-up for board placement, Min A and cueing for hand placement with good head/hip relationship. Wc mobility into bathroom for SB transfer to bariatric BSC with Mod A, increased time/effort and cues for hand placement and forward lean. Patient able to complete seated "push-up" for clothing management. Patient continent of bowel. Hygiene of front perineal area and buttocks in sitting with presence of light red blood. RN notified. Sit to stand from bariatric [facility] with use of arm rests for clothing management. SB transfer to wc on L with Mod A and cueing for hand placement and head/hip relationship. Wc mobility to ortho gym for therapeutic exercise  with use of arm ergometer. Focus on endurance and BUE strengthening for 1 bout of 5 minutes. Session concluded with patient seated in wc with belt alarm activated, call bell within reach and all needs met.   Session 2: Patient met seated in wc in agreement with OT treatment session with focus on therapeutic activity and community re-integration as detailed below. Patient able to self-propel wc throughout [place], around corners, and in tight spaces with supervision A and occasional vc's. Scavenger hunt in [place] gift [employer] with patient able to immediately recall 3/3 items, and 3/3 items after 3 min and 5 min. Patient with questions about process of upcoming family education session with OT providing insight about what to expect. Patient notes some anxiety about returning home with fear of managing wrapping of residual limb with OT providing emotional support and reassurance of plans in place for Brittany [facility], PT/OT services. Patient self-propelled wc back to room and returned to supine via SB transfer with Mod A. Session concluded with patient lying supine in bed with call bell within reach, bed alarm activated, and all needs met.    Therapy Documentation Precautions:  Precautions Precautions: Fall Precaution Comments: R AKA, like to wear her L AFO/shoe Restrictions Weight Bearing Restrictions: Yes RLE Weight Bearing: Non weight bearing  Therapy/Group: Individual Therapy  Brittany Russo R Brittany Russo 07/14/2019, 7:57 AM

## 2019-07-14 NOTE — Progress Notes (Signed)
Per tech/nurse urine was clear earlier in the day but now bloody with clots. Urine specimen was saved and noted to have 2 clots --dime/quater size. UCS positive for citrobacter ferundi.  Contacted Dr. Lovena Neighbours regarding clots formation. He recommended encouraging hydration to keep bladder flushed. To continue macrodantin for UTI treatment. Patient updated.

## 2019-07-14 NOTE — Progress Notes (Signed)
Occupational Therapy Session Note  Patient Details  Name: Brittany Russo MRN: [id number] Date of Birth: 1945/08/05  Today's Date: 07/14/2019 OT Individual Time: [id number] Individual Time Calculation (min): 42 min    Short Term Goals: Week 2:  OT Short Term Goal 1 (Week 2): STG = LTGs due to remaining LOS  Skilled Therapeutic Interventions/Progress Updates:    Treatment session with focus on LB dressing and residual limb care.  Pt received upright in w/c reporting fatigue but agreeable to therapy session.  Pt completed slide board transfer with assist for placement and min assist and cues for weight shifting during transfer.  Pt able to doff pants at bed level.  Therapist removed ace wrap and encouraged pt to utilize inspection mirror to check lower extremity/surgical site.  Therapist rewrapped residual limb in figure 8 pattern, anchoring around waist to increase fit.  Pt able to pull pants back over hips at bed level, but required assist to thread Rt pant leg.  Returned to w/c via slide board again with assist for placement of board and cues for anterior weight shift. Engaged in 2 sets of 5 chair pushups with focus on increased tolerance boosted from chair to allow for clothing management/hygiene post toileting.  Pt remained upright in w/c with seat belt alarm on and all needs in reach.  Therapy Documentation Precautions:  Precautions Precautions: Fall Precaution Comments: R AKA, like to wear her L AFO/shoe Restrictions Weight Bearing Restrictions: Yes RLE Weight Bearing: Non weight bearing General:   Vital Signs: Therapy Vitals Temp: 98.2 F (36.8 C) Temp Source: Oral Pulse Rate: 73 Resp: 18 BP: (!) 127/54 Patient Position (if appropriate): Sitting Oxygen Therapy SpO2: 100 % O2 Device: Room Air Pain: Pain Assessment Pain Scale: 0-10 Pain Score: 0-No pain   Therapy/Group: Individual Therapy  Simonne Come 07/14/2019, 3:17 PM

## 2019-07-14 NOTE — Progress Notes (Signed)
Patient noted to have passed clots when using the bedpan this evening. Patient has been passing dark red blood at times in small amounts. Reesa Chew, PA aware of the dark blood and said to monitor for clots. This Probation officer called Olin Hauser to inform her that she was now passing clots. Olin Hauser stated was coming to check on her.

## 2019-07-15 ENCOUNTER — Inpatient Hospital Stay (HOSPITAL_COMMUNITY): Payer: Medicare Other

## 2019-07-15 LAB — CBC WITH DIFFERENTIAL/PLATELET
Abs Immature Granulocytes: 0.02 10*3/uL (ref 0.00–0.07)
Basophils Absolute: 0.1 10*3/uL (ref 0.0–0.1)
Basophils Relative: 1 %
Eosinophils Absolute: 0.3 10*3/uL (ref 0.0–0.5)
Eosinophils Relative: 6 %
HCT: 25.2 % — ABNORMAL LOW (ref 36.0–46.0)
Hemoglobin: 7.8 g/dL — ABNORMAL LOW (ref 12.0–15.0)
Immature Granulocytes: 0 %
Lymphocytes Relative: 12 %
Lymphs Abs: 0.7 10*3/uL (ref 0.7–4.0)
MCH: 29 pg (ref 26.0–34.0)
MCHC: 31 g/dL (ref 30.0–36.0)
MCV: 93.7 fL (ref 80.0–100.0)
Monocytes Absolute: 0.6 10*3/uL (ref 0.1–1.0)
Monocytes Relative: 11 %
Neutro Abs: 3.7 10*3/uL (ref 1.7–7.7)
Neutrophils Relative %: 70 %
Platelets: 99 10*3/uL — ABNORMAL LOW (ref 150–400)
RBC: 2.69 MIL/uL — ABNORMAL LOW (ref 3.87–5.11)
RDW: 18.9 % — ABNORMAL HIGH (ref 11.5–15.5)
WBC: 5.3 10*3/uL (ref 4.0–10.5)
nRBC: 0 % (ref 0.0–0.2)

## 2019-07-15 LAB — GLUCOSE, CAPILLARY
Glucose-Capillary: 102 mg/dL — ABNORMAL HIGH (ref 70–99)
Glucose-Capillary: 128 mg/dL — ABNORMAL HIGH (ref 70–99)
Glucose-Capillary: 96 mg/dL (ref 70–99)
Glucose-Capillary: 116 mg/dL — ABNORMAL HIGH (ref 70–99)

## 2019-07-15 NOTE — Progress Notes (Signed)
[place] PHYSICAL MEDICINE & REHABILITATION PROGRESS NOTE  Subjective/Complaints: Appreciate Uro assist , UCx showed citrobacter  Discussed hematuria , UTI   ROS: Denies CP, SOB, N/V/D  Objective: Vital Signs: Blood pressure (!) 147/65, pulse 75, temperature 97.7 F (36.5 C), temperature source Oral, resp. rate 18, height 5' 5"  (1.651 m), weight 73.6 kg, SpO2 99 %. No results found. Recent Labs    07/14/19 0800  WBC 3.5*  HGB 8.1*  HCT 26.6*  PLT 76*   No results for input(s): NA, K, CL, CO2, GLUCOSE, BUN, CREATININE, CALCIUM in the last 72 hours.  Physical Exam: BP (!) 147/65 (BP Location: Right Arm)   Pulse 75   Temp 97.7 F (36.5 C) (Oral)   Resp 18   Ht 5' 5"  (1.651 m)   Wt 73.6 kg   SpO2 99%   BMI 27.00 kg/m   General: No acute distress Mood and affect are appropriate Heart: Regular rate and rhythm no rubs murmurs or extra sounds Lungs: Clear to auscultation, breathing unlabored, no rales or wheezes Abdomen: Positive bowel sounds, soft nontender to palpation, nondistended, no suprapubic tenderness Extremities: No clubbing, cyanosis, or edema  Motor:  Left lower extremity: Hip flexion, knee extension 4 -/5, ankle dorsiflexion tight heelcord, but able to dorsiflex, unchanged Right lower extremity: Hip flexion 4/5 (some pain inhibition), unchanged  Assessment/Plan: 1. Functional deficits secondary to right AKA which require 3+ hours per day of interdisciplinary therapy in a comprehensive inpatient rehab setting.  Physiatrist is providing close team supervision and 24 hour management of active medical problems listed below.  Physiatrist and rehab team continue to assess barriers to discharge/monitor patient progress toward functional and medical goals  Care Tool:  Bathing    Body parts bathed by patient: Right arm, Left arm, Chest, Abdomen, Front perineal area, Right upper leg, Left upper leg, Left lower leg, Face   Body parts bathed by helper:  Buttocks Body parts n/a: Right lower leg   Bathing assist Assist Level: Moderate Assistance - Patient 50 - 74%     Upper Body Dressing/Undressing Upper body dressing   What is the patient wearing?: Pull over shirt    Upper body assist Assist Level: Set up assist    Lower Body Dressing/Undressing Lower body dressing      What is the patient wearing?: Pants     Lower body assist Assist for lower body dressing: Moderate Assistance - Patient 50 - 74%     Toileting Toileting    Toileting assist Assist for toileting: Moderate Assistance - Patient 50 - 74% Assistive Device Comment: BSC in bathroom   Transfers Chair/bed transfer  Transfers assist  Chair/bed transfer activity did not occur: Safety/medical concerns  Chair/bed transfer assist level: Moderate Assistance - Patient 50 - 74%     Locomotion Ambulation   Ambulation assist   Ambulation activity did not occur: Safety/medical concerns          Walk 10 feet activity   Assist  Walk 10 feet activity did not occur: Safety/medical concerns        Walk 50 feet activity   Assist Walk 50 feet with 2 turns activity did not occur: Safety/medical concerns         Walk 150 feet activity   Assist Walk 150 feet activity did not occur: Safety/medical concerns         Walk 10 feet on uneven surface  activity   Assist Walk 10 feet on uneven surfaces activity did not occur: Safety/medical  concerns         Wheelchair     Assist Will patient use wheelchair at discharge?: Yes Type of Wheelchair: Manual    Wheelchair assist level: Supervision/Verbal cueing Max wheelchair distance: 143f    Wheelchair 50 feet with 2 turns activity    Assist        Assist Level: Supervision/Verbal cueing   Wheelchair 150 feet activity     Assist     Assist Level: Supervision/Verbal cueing      Medical Problem List and Plan: 1.  Impaired function, ADLs and mobility secondary to R AKA due  to failed TKRs  Continue CIR PT, OT  2.  Antithrombotics: -DVT/anticoagulation:  Cannot do lovenox due to low platelets             -antiplatelet therapy: N/A 3. Pain Management: Tramadol 50 mg qid with robaxin/Hydrocodone prn  Phantom limb pain    Gabapentin 100 BID started on 4/13, changed to 300 mg nightly on 4/15, changed to twice daily on 4/19, tolerating  Controlled with meds on 4/23 4. Mood: LCSW to follow for evaluation and support.              -antipsychotic agents: N/A 5. Neuropsych: This patient is ?fully capable of making decisions on her own behalf.  Discussed admission with neuropsych, appreciate evaluation and recs 6. Skin/Wound Care: Monitor wound for healing once surgical dressing removed.   ABD pad, with Kerlix and Ace dressings to AKA. 7. Fluids/Electrolytes/Nutrition: Monitor I/Os 8. Cirrhosis of the liver: Continue Xifaxan.  Hyperammonemia - clinically improving   Lactulose started on 4/14, stopped due to loose stools on 4/18 9. ABLA:   Hemoglobin 8.1 on 4/23, will recheck today to look for significant drop   Continue to monitor 10. Chronic Thrombocytopenia: Monitor for signs of bleeding. Baseline at 74-->51.   Platelets 76 on 4/20, labs ordered for Monday  Continue to monitor 11. Chronic hyponatremia: Continue to monitor.   Sodium 134 on 4/21  Continue to monitor 12. T2DM with hyperglycemia: Hgb A1c-5.2.   Glucotrol 2.5 twice daily, discussed with pharmacy DC on 4/23  Continue Tradjenta 5 mg daily  Continue metformin   Lantus 20 units daily, decreased to 15 units on 4/18, [place] with pharmacy, [place]  Labile with hypoglycemia on 4/23 CBG (last 3)  Recent Labs    07/14/19 1634 07/14/19 2104 07/15/19 0627  GLUCAP 138* 171* 102*   Controlled  13. H/o depression: ON Lexapro, Xanax and Wellbutrin. 14.  Labile blood pressure  Labile on 4/23, monitor trend 15.  Radiation cystitis- UTI may contribute   Discussed with Urology - seen as outpatient  for hematuria - monitor for clots.  No other intervention at present 16. Acute lower UTI  UA +, Ucx >100K Citrobacter  Continue Macrobid which is sensitive   LOS: 11 days A FACE TO FACE EVALUATION WAS PERFORMED  ACharlett Blake4/24/2021, 8:52 AM

## 2019-07-15 NOTE — Progress Notes (Signed)
Occupational Therapy Session Note  Patient Details  Name: NAZARIA RIESEN MRN: [id number] Date of Birth: 01-Jun-1945  Today's Date: 07/15/2019 OT Individual Time: 1500-1600 OT Individual Time Calculation (min): 60 min    Short Term Goals: Week 1:  OT Short Term Goal 1 (Week 1): Pt will complete toilet transfers to drop arm BSC with mod assist OT Short Term Goal 1 - Progress (Week 1): Met OT Short Term Goal 2 (Week 1): Pt will complete LB dressing with mod assist with lateral leans vs sit > stand OT Short Term Goal 2 - Progress (Week 1): Met OT Short Term Goal 3 (Week 1): Pt will complete toileting tasks with mod assist OT Short Term Goal 3 - Progress (Week 1): Progressing toward goal  Skilled Therapeutic Interventions/Progress Updates:    1;1. Pt received in bed agreeable to OT just being tired. Pt with no wrapping on residual limb therefore applied ABD pad and ace bandage per RN. Pt completes LB dressing with S/VC for reacher use to thread LLE first. Pt requires VC for wolling to EOB prior to pressing up with CGA to assume sitting at EOB. Pt completes slide board transfer with MOD A overall after OT don sock and shoe. Pt propels w/c to/from dayroom to participate in painting actiity in seated with light wrist weights on wrists to improve BUE strength and mild improvement in tremor. Exited session with pt seated in w/c, belt alarm on and call light in reach.   Therapy Documentation Precautions:  Precautions Precautions: Fall Precaution Comments: R AKA, like to wear her L AFO/shoe Restrictions Weight Bearing Restrictions: Yes RLE Weight Bearing: Non weight bearing General:   Vital Signs: Therapy Vitals Temp: 97.8 F (36.6 C) Pulse Rate: 74 Resp: 20 BP: 138/66 Patient Position (if appropriate): Lying Oxygen Therapy SpO2: 96 % O2 Device: Room Air Pain:   ADL: ADL Upper Body Bathing: Setup Where Assessed-Upper Body Bathing: Bed level Lower Body Bathing: Moderate  assistance Where Assessed-Lower Body Bathing: Bed level Upper Body Dressing: Minimal assistance Where Assessed-Upper Body Dressing: Edge of bed Lower Body Dressing: Dependent Where Assessed-Lower Body Dressing: Bed level Vision   Perception    Praxis   Exercises:   Other Treatments:     Therapy/Group: Individual Therapy  Tonny Branch 07/15/2019, 4:06 PM

## 2019-07-16 LAB — GLUCOSE, CAPILLARY
Glucose-Capillary: 98 mg/dL (ref 70–99)
Glucose-Capillary: 122 mg/dL — ABNORMAL HIGH (ref 70–99)
Glucose-Capillary: 74 mg/dL (ref 70–99)
Glucose-Capillary: 95 mg/dL (ref 70–99)

## 2019-07-16 NOTE — Progress Notes (Signed)
[place] PHYSICAL MEDICINE & REHABILITATION PROGRESS NOTE  Subjective/Complaints: Reviewed Hgb no sig drop vs prior , discussed with pt  No sig stump pain Slept well last noc   ROS: Denies CP, SOB, N/V/D  Objective: Vital Signs: Blood pressure (!) 146/57, pulse 74, temperature 98.4 F (36.9 C), temperature source Oral, resp. rate 20, height 5' 5"  (1.651 m), weight 73.6 kg, SpO2 94 %. No results found. Recent Labs    07/14/19 0800 07/15/19 0855  WBC 3.5* 5.3  HGB 8.1* 7.8*  HCT 26.6* 25.2*  PLT 76* 99*   No results for input(s): NA, K, CL, CO2, GLUCOSE, BUN, CREATININE, CALCIUM in the last 72 hours.  Physical Exam: BP (!) 146/57 (BP Location: Right Arm)   Pulse 74   Temp 98.4 F (36.9 C) (Oral)   Resp 20   Ht 5' 5"  (1.651 m)   Wt 73.6 kg   SpO2 94%   BMI 27.00 kg/m   General: No acute distress Mood and affect are appropriate Heart: Regular rate and rhythm no rubs murmurs or extra sounds Lungs: Clear to auscultation, breathing unlabored, no rales or wheezes Abdomen: Positive bowel sounds, soft nontender to palpation, nondistended, no suprapubic tenderness Extremities: No clubbing, cyanosis, or edema  Motor:  Left lower extremity: Hip flexion, knee extension 4 -/5, ankle dorsiflexion tight heelcord, but able to dorsiflex, 3- ADF Right lower extremity: Hip flexion 4/5 (some pain inhibition), unchanged  Assessment/Plan: 1. Functional deficits secondary to right AKA which require 3+ hours per day of interdisciplinary therapy in a comprehensive inpatient rehab setting.  Physiatrist is providing close team supervision and 24 hour management of active medical problems listed below.  Physiatrist and rehab team continue to assess barriers to discharge/monitor patient progress toward functional and medical goals  Care Tool:  Bathing    Body parts bathed by patient: Right arm, Left arm, Chest, Abdomen, Front perineal area, Right upper leg, Left upper leg, Left lower  leg, Face   Body parts bathed by helper: Buttocks Body parts n/a: Right lower leg   Bathing assist Assist Level: Moderate Assistance - Patient 50 - 74%     Upper Body Dressing/Undressing Upper body dressing   What is the patient wearing?: Pull over shirt    Upper body assist Assist Level: Set up assist    Lower Body Dressing/Undressing Lower body dressing      What is the patient wearing?: Pants     Lower body assist Assist for lower body dressing: Moderate Assistance - Patient 50 - 74%     Toileting Toileting    Toileting assist Assist for toileting: Moderate Assistance - Patient 50 - 74% Assistive Device Comment: BSC in bathroom   Transfers Chair/bed transfer  Transfers assist  Chair/bed transfer activity did not occur: Safety/medical concerns  Chair/bed transfer assist level: Moderate Assistance - Patient 50 - 74%     Locomotion Ambulation   Ambulation assist   Ambulation activity did not occur: Safety/medical concerns          Walk 10 feet activity   Assist  Walk 10 feet activity did not occur: Safety/medical concerns        Walk 50 feet activity   Assist Walk 50 feet with 2 turns activity did not occur: Safety/medical concerns         Walk 150 feet activity   Assist Walk 150 feet activity did not occur: Safety/medical concerns         Walk 10 feet on uneven surface  activity   Assist Walk 10 feet on uneven surfaces activity did not occur: Safety/medical concerns         Wheelchair     Assist Will patient use wheelchair at discharge?: Yes Type of Wheelchair: Manual    Wheelchair assist level: Supervision/Verbal cueing Max wheelchair distance: 12f    Wheelchair 50 feet with 2 turns activity    Assist        Assist Level: Supervision/Verbal cueing   Wheelchair 150 feet activity     Assist     Assist Level: Supervision/Verbal cueing      Medical Problem List and Plan: 1.  Impaired  function, ADLs and mobility secondary to R AKA due to failed TKRs  Continue CIR PT, OT  2.  Antithrombotics: -DVT/anticoagulation:  Cannot do lovenox due to low platelets             -antiplatelet therapy: N/A 3. Pain Management: Tramadol 50 mg qid with robaxin/Hydrocodone prn  Phantom limb pain    Gabapentin 100 BID started on 4/13, changed to 300 mg nightly on 4/15, changed to twice daily on 4/19, tolerating  Controlled with meds on 4/25 4. Mood: LCSW to follow for evaluation and support.              -antipsychotic agents: N/A 5. Neuropsych: This patient is ?fully capable of making decisions on her own behalf.  Discussed admission with neuropsych, appreciate evaluation and recs 6. Skin/Wound Care: Monitor wound for healing once surgical dressing removed.   ABD pad, with Kerlix and Ace dressings to AKA. 7. Fluids/Electrolytes/Nutrition: Monitor I/Os 8. Cirrhosis of the liver: Continue Xifaxan.  Hyperammonemia - clinically improving   Lactulose started on 4/14, stopped due to loose stools on 4/18 9. ABLA:   Hemoglobin 8.1 on 4/23, 7.8 4/24 no sig drop thus far cont to monitor   Continue to monitor 10. Chronic Thrombocytopenia: Monitor for signs of bleeding. Baseline at 74-->51.   Platelets 76 on 4/20, labs ordered for Monday  Continue to monitor 11. Chronic hyponatremia: Continue to monitor.   Sodium 134 on 4/21  Continue to monitor 12. T2DM with hyperglycemia: Hgb A1c-5.2.   Glucotrol 2.5 twice daily, discussed with pharmacy DC on 4/23  Continue Tradjenta 5 mg daily  Continue metformin   Lantus 20 units daily, decreased to 15 units on 4/18, [place] with pharmacy[place]  Labile with hypoglycemia on 4/23 CBG (last 3)  Recent Labs    07/15/19 1639 07/15/19 2051 07/16/19 0600  GLUCAP 96 116* 98   Controlled 4/25 13. H/o depression: ON Lexapro, Xanax and Wellbutrin. 14.  Labile blood pressure  Labile on 4/23, monitor trend 15.  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  No other intervention at present 16. Acute lower UTI  UA +, Ucx >100K Citrobacter  Continue Macrobid which is sensitive   LOS: 12 days A FACE TO FACE EVALUATION WAS PERFORMED  ACharlett Blake4/25/2021, 7:20 AM

## 2019-07-17 ENCOUNTER — Ambulatory Visit (HOSPITAL_COMMUNITY): Payer: Medicare Other

## 2019-07-17 ENCOUNTER — Inpatient Hospital Stay (HOSPITAL_COMMUNITY): Payer: Medicare Other | Admitting: Occupational Therapy

## 2019-07-17 ENCOUNTER — Encounter (HOSPITAL_COMMUNITY): Payer: Medicare Other | Admitting: Occupational Therapy

## 2019-07-17 DIAGNOSIS — R310 Gross hematuria: Secondary | ICD-10-CM

## 2019-07-17 LAB — GLUCOSE, CAPILLARY
Glucose-Capillary: 125 mg/dL — ABNORMAL HIGH (ref 70–99)
Glucose-Capillary: 171 mg/dL — ABNORMAL HIGH (ref 70–99)
Glucose-Capillary: 132 mg/dL — ABNORMAL HIGH (ref 70–99)
Glucose-Capillary: 142 mg/dL — ABNORMAL HIGH (ref 70–99)

## 2019-07-17 LAB — CBC
HCT: 25.4 % — ABNORMAL LOW (ref 36.0–46.0)
Hemoglobin: 7.9 g/dL — ABNORMAL LOW (ref 12.0–15.0)
MCH: 30.4 pg (ref 26.0–34.0)
MCHC: 31.1 g/dL (ref 30.0–36.0)
MCV: 97.7 fL (ref 80.0–100.0)
Platelets: 129 10*3/uL — ABNORMAL LOW (ref 150–400)
RBC: 2.6 MIL/uL — ABNORMAL LOW (ref 3.87–5.11)
RDW: 19.3 % — ABNORMAL HIGH (ref 11.5–15.5)
WBC: 6.7 10*3/uL (ref 4.0–10.5)
nRBC: 0 % (ref 0.0–0.2)

## 2019-07-17 LAB — BASIC METABOLIC PANEL
Anion gap: 9 (ref 5–15)
BUN: 13 mg/dL (ref 8–23)
CO2: 26 mmol/L (ref 22–32)
Calcium: 8.2 mg/dL — ABNORMAL LOW (ref 8.9–10.3)
Chloride: 95 mmol/L — ABNORMAL LOW (ref 98–111)
Creatinine, Ser: 0.87 mg/dL (ref 0.44–1.00)
GFR calc Af Amer: >60 mL/min (ref >60)
GFR calc non Af Amer: >60 mL/min (ref >60)
Glucose, Bld: 147 mg/dL — ABNORMAL HIGH (ref 70–99)
Potassium: 4.4 mmol/L (ref 3.5–5.1)
Sodium: 130 mmol/L — ABNORMAL LOW (ref 135–145)

## 2019-07-17 NOTE — Progress Notes (Signed)
[place] PHYSICAL MEDICINE & REHABILITATION PROGRESS NOTE  Subjective/Complaints: Patient seen laying in bed this morning.  She states she slept well overnight.  She states had a good weekend.  She is questions regarding discharge date.  She states she was seen by surgery earlier this morning.  Over the weekend, patient had clots in urine.  ROS: Denies CP, SOB, N/V/D  Objective: Vital Signs: Blood pressure (!) 153/59, pulse 74, temperature 98.1 F (36.7 C), resp. rate 18, height 5' 5"  (1.651 m), weight 73.6 kg, SpO2 95 %. No results found. Recent Labs    07/15/19 0855 07/17/19 0609  WBC 5.3 6.7  HGB 7.8* 7.9*  HCT 25.2* 25.4*  PLT 99* 129*   Recent Labs    07/17/19 0609  NA 130*  K 4.4  CL 95*  CO2 26  GLUCOSE 147*  BUN 13  CREATININE 0.87  CALCIUM 8.2*    Physical Exam: BP (!) 153/59 (BP Location: Left Arm)   Pulse 74   Temp 98.1 F (36.7 C)   Resp 18   Ht 5' 5"  (1.651 m)   Wt 73.6 kg   SpO2 95%   BMI 27.00 kg/m  Constitutional: No distress . Vital signs reviewed. HENT: Normocephalic.  Atraumatic. Eyes: EOMI. No discharge. Cardiovascular: No JVD. Respiratory: Normal effort.  No stridor. GI: Non-distended. Skin: Right AKA with dressing C/D/I Psych: Normal mood.  Normal behavior. Musc: Right AKA with edema and tenderness Neuro: Alert Motor:  Left lower extremity: Hip flexion, knee extension 4/5, ankle dorsiflexion tight heelcord, but able to dorsiflex, 4-/5 ADF Right lower extremity: Hip flexion 4/5 (some pain inhibition), unchanged  Assessment/Plan: 1. Functional deficits secondary to right AKA which require 3+ hours per day of interdisciplinary therapy in a comprehensive inpatient rehab setting.  Physiatrist is providing close team supervision and 24 hour management of active medical problems listed below.  Physiatrist and rehab team continue to assess barriers to discharge/monitor patient progress toward functional and medical goals  Care  Tool:  Bathing    Body parts bathed by patient: Right arm, Left arm, Chest, Abdomen, Front perineal area, Right upper leg, Left upper leg, Left lower leg, Face   Body parts bathed by helper: Buttocks Body parts n/a: Right lower leg   Bathing assist Assist Level: Moderate Assistance - Patient 50 - 74%     Upper Body Dressing/Undressing Upper body dressing   What is the patient wearing?: Pull over shirt    Upper body assist Assist Level: Set up assist    Lower Body Dressing/Undressing Lower body dressing      What is the patient wearing?: Pants     Lower body assist Assist for lower body dressing: Moderate Assistance - Patient 50 - 74%     Toileting Toileting    Toileting assist Assist for toileting: Moderate Assistance - Patient 50 - 74% Assistive Device Comment: BSC in bathroom   Transfers Chair/bed transfer  Transfers assist  Chair/bed transfer activity did not occur: Safety/medical concerns  Chair/bed transfer assist level: Moderate Assistance - Patient 50 - 74%     Locomotion Ambulation   Ambulation assist   Ambulation activity did not occur: Safety/medical concerns          Walk 10 feet activity   Assist  Walk 10 feet activity did not occur: Safety/medical concerns        Walk 50 feet activity   Assist Walk 50 feet with 2 turns activity did not occur: Safety/medical concerns  Walk 150 feet activity   Assist Walk 150 feet activity did not occur: Safety/medical concerns         Walk 10 feet on uneven surface  activity   Assist Walk 10 feet on uneven surfaces activity did not occur: Safety/medical concerns         Wheelchair     Assist Will patient use wheelchair at discharge?: Yes Type of Wheelchair: Manual    Wheelchair assist level: Supervision/Verbal cueing Max wheelchair distance: 199f    Wheelchair 50 feet with 2 turns activity    Assist        Assist Level: Supervision/Verbal cueing    Wheelchair 150 feet activity     Assist     Assist Level: Supervision/Verbal cueing      Medical Problem List and Plan: 1.  Impaired function, ADLs and mobility secondary to R AKA due to failed TKRs  Continue CIR  2.  Antithrombotics: -DVT/anticoagulation:  Cannot do lovenox due to low platelets             -antiplatelet therapy: N/A 3. Pain Management: Tramadol 50 mg qid with robaxin/Hydrocodone prn  Phantom limb pain    Gabapentin 100 BID started on 4/13, changed to 300 mg nightly on 4/15, changed to twice daily on 4/19, tolerating  Controlled with meds on 4/26 4. Mood: LCSW to follow for evaluation and support.              -antipsychotic agents: N/A 5. Neuropsych: This patient is ?fully capable of making decisions on her own behalf.  Discussed admission with neuropsych, appreciate evaluation and recs 6. Skin/Wound Care: Monitor wound for healing once surgical dressing removed.   ABD pad, with Kerlix and Ace dressings to AKA. 7. Fluids/Electrolytes/Nutrition: Monitor I/Os 8. Cirrhosis of the liver: Continue Xifaxan.  Hyperammonemia - clinically improving   Lactulose started on 4/14, stopped due to loose stools on 4/18 9. ABLA:   Hemoglobin 7.9 on 4/26, labs ordered for tomorrow  Continue to monitor 10. Chronic Thrombocytopenia: Monitor for signs of bleeding. Baseline at 74-->51.   Platelets 129 on 4/26  Continue to monitor 11. Chronic hyponatremia: Continue to monitor.   Sodium 130 on 4/26  Continue to monitor 12. T2DM with hyperglycemia: Hgb A1c-5.2.   Glucotrol 2.5 twice daily, discussed with pharmacy DC on 4/23  Continue Tradjenta 5 mg daily  Continue metformin   Lantus 20 units daily, decreased to 15 units on 4/18, discussed with pharmacy, [place]  Labile on 4/26 CBG (last 3)  Recent Labs    07/16/19 1621 07/16/19 2123 07/17/19 0600  GLUCAP 74 95 125*  13. H/o depression: ON Lexapro, Xanax and Wellbutrin. 14.  Labile blood pressure  Labile on  4/23, monitor trend 15.  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  Noted to have clots, discussed with urology, continue to encourage fluids for bladder irrigation as well as antibiotics  No other intervention at present 16. Acute lower UTI  UA +, Ucx >100K Citrobacter  Continue Macrobid which is sensitive   LOS: 13 days A FACE TO FACE EVALUATION WAS PERFORMED  Jazon Jipson ALorie Phenix4/26/2021, 8:44 AM

## 2019-07-17 NOTE — Progress Notes (Signed)
Physical Therapy Session Note  Patient Details  Name: Brittany Russo MRN: [id number] Date of Birth: 10/24/1945  Today's Date: 07/17/2019 PT Individual Time: 1415-1534 PT Individual Time Calculation (min): 79 min   Short Term Goals: Week 1:  PT Short Term Goal 1 (Week 1): Pt will initiate standing training PT Short Term Goal 1 - Progress (Week 1): Met PT Short Term Goal 2 (Week 1): Pt will transfer bed<>chair w/ min assist PT Short Term Goal 2 - Progress (Week 1): Met PT Short Term Goal 3 (Week 1): Pt will perform bed mobility w/ min assist PT Short Term Goal 3 - Progress (Week 1): Met PT Short Term Goal 4 (Week 1): Pt will maintain dynamic sitting balance w/ supervision PT Short Term Goal 4 - Progress (Week 1): Met Week 2:  PT Short Term Goal 1 (Week 2): STG=LTG due to LOS  Skilled Therapeutic Interventions/Progress Updates:   Received pt supine in bed, pt agreeable to therapy, and denied any pain during session. Husband and daughter present for family education training. Session focused on discharge planning, functional mobility/transfers, UE/LE strength, dynamic sitting/standing balance/coordination, simulated car transfers, amputee education, and improved activity tolerance. RN present and educated pt's family on gauze, tape, and pad placements to assist with drainage. Therapist educated pt's family on limb wrapping techniques to wrap R residual limb. Pt's daughter required min A from therapist to assist with correct technique when wrapping. Pt with blood in brief and doffed soiled brief and donned clean one with mod A and help from daughter and therapist. Pt transferred supine<>sitting EOB with supervision and transferred bed<>WC min A using SB with therapist. Pt's family was educated on transfer technique, body mechanics, and safety during transfers. Pt's family verbalized confidence with SB transfers and stated they practiced some with OT in previous session. Pt performed simulated car  transfer with SB and min A from therapist. Pt required verbal cues for anterior weight shifting, head/hips relationship, and hand placement on board. Pt and family verbalized confidence with task and declined practicing again despite therapist's recommendation. Pt transferred sit<>stand min A with bilateral UE support on parallel bars and able to remain standing for approximately 1 minute. Pt reported increased L knee pain and requested to sit. Pt transported back to room in [facility] total assist and transferred WC<>bedside commode with SB min A. Pt required mod A from therapist and pt's daughter to doff brief and pants using lateral leans. Pt with large amounts of blood in urine and RN made aware. Pt able to perform hygiene management with supervision. Pt transferred bedside commode<>WC mod A using SB and WC<>bed using SB with min A. Pt transferred sit<>supine with supervision. Concluded session with pt supine in bed, needs within reach, and bed alarm on. RN updated on pt's current status.   Therapy Documentation Precautions:  Precautions Precautions: Fall Precaution Comments: R AKA, like to wear her L AFO/shoe Restrictions Weight Bearing Restrictions: Yes RLE Weight Bearing: Non weight bearing  Therapy/Group: Individual Therapy Alfonse Alpers PT, DPT   07/17/2019, 7:43 AM

## 2019-07-17 NOTE — Plan of Care (Signed)
  Problem: Consults Goal: RH LIMB LOSS PATIENT EDUCATION Description: Description: See Patient Education module for eduction specifics. Outcome: Progressing Goal: Skin Care Protocol Initiated - if Braden Score 18 or less Description: If consults are not indicated, leave blank or document N/A Outcome: Progressing Goal: Diabetes Guidelines if Diabetic/Glucose > 140 Description: If diabetic or lab glucose is > 140 mg/dl - Initiate Diabetes/Hyperglycemia Guidelines & Document Interventions  Outcome: Progressing   Problem: RH SKIN INTEGRITY Goal: RH STG SKIN FREE OF INFECTION/BREAKDOWN Description: Patients skin will remain free from further infection or breakdown with Min assist. Outcome: Progressing Goal: RH STG MAINTAIN SKIN INTEGRITY WITH ASSISTANCE Description: STG Maintain Skin Integrity With min Assistance. Outcome: Progressing Goal: RH STG ABLE TO PERFORM INCISION/WOUND CARE W/ASSISTANCE Description: STG Able To Perform Incision/Wound Care With total Assistance from caregiver. Outcome: Progressing   Problem: RH SAFETY Goal: RH STG ADHERE TO SAFETY PRECAUTIONS W/ASSISTANCE/DEVICE Description: STG Adhere to Safety Precautions With mod I  Assistance/Device. Outcome: Progressing   Problem: RH PAIN MANAGEMENT Goal: RH STG PAIN MANAGED AT OR BELOW PT'S PAIN GOAL Description: < 4 Outcome: Progressing   Problem: RH KNOWLEDGE DEFICIT LIMB LOSS Goal: RH STG INCREASE KNOWLEDGE OF SELF CARE AFTER LIMB LOSS Description: Patient/caregiver will verbalize understanding of treatment, monitoring, medications, and follow up care of her new amputation with min assist. Outcome: Progressing

## 2019-07-17 NOTE — Progress Notes (Signed)
Patient ID: Brittany Russo, female   DOB: [birth year], 74 y.o.   MRN: [id number] Subjective: POD# 18 status post right AKA for failed right total knee replacement    She tells me she is feeling very anxious this am because she is to go home (she thought today but I re-oriented her to the fact that she is to be discharge on Wednesday the 26th)  Reports phantom pain "down leg into what was my foot"  Objective:   VITALS:   Vitals:   07/17/19 0558 07/17/19 1426  BP: (!) 153/59 139/60  Pulse: 74 72  Resp: 18 20  Temp: 98.1 F (36.7 C) 98.3 F (36.8 C)  SpO2: 95% 99%    Exam: Right AKA stump evaluated  Staple line intact Some serosanguinous drainage on medial aspect of dressing, no active drainage    LABS Recent Labs    07/15/19 0855 07/17/19 0609  HGB 7.8* 7.9*  HCT 25.2* 25.4*  WBC 5.3 6.7  PLT 99* 129*    Recent Labs    07/17/19 0609  NA 130*  K 4.4  BUN 13  CREATININE 0.87  GLUCOSE 147*    No results for input(s): LABPT, INR in the last 72 hours.   Assessment/Plan: Status post right AKA 06/29/2019 doing fairly well with phantom pain  Plan: Recommended xeroform dressings to staple line with gauze and tape Further amputation protocol per Rehab Team RTC next week to have staples removed and wound reassessed

## 2019-07-17 NOTE — Progress Notes (Signed)
Occupational Therapy Session Note  Patient Details  Name: Brittany Russo MRN: [id number] Date of Birth: 03-Jan-1946  Today's Date: 07/17/2019 OT Individual Time: 0904-1000 OT Individual Time Calculation (min): 56 min    Short Term Goals: Week 1:  OT Short Term Goal 1 (Week 1): Pt will complete toilet transfers to drop arm BSC with mod assist OT Short Term Goal 1 - Progress (Week 1): Met OT Short Term Goal 2 (Week 1): Pt will complete LB dressing with mod assist with lateral leans vs sit > stand OT Short Term Goal 2 - Progress (Week 1): Met OT Short Term Goal 3 (Week 1): Pt will complete toileting tasks with mod assist OT Short Term Goal 3 - Progress (Week 1): Progressing toward goal Week 2:  OT Short Term Goal 1 (Week 2): STG = LTGs due to remaining LOS  Skilled Therapeutic Interventions/Progress Updates:  Patient met lying supine on bedpan in agreement with OT treatment session with focus on self-care re-education and functional transfers as detailed below. Patient notes presence of phantom limb pain at rest and with activity. RN provided meds. Patient able to roll L for hygiene management with noted blood present of unknown origin. RN made aware. Curlex and ace bandage applied to R residual limb. LB dressing in supine with patient able to thread LLE with use of reacher and roll R<>L to hike pants over hips. Supine to EOB with Min A and use of bed features. Patient completed SB transfer to wc on R with Min A and 1-2 vc's for hand placement and head/hip relationship. Seated at sink level, patient completed grooming tasks with set-up assist. Patient with questions on process for family education this afternoon. OT provided clarification. Session concluded with patient seated in wc with call bell within reach, belt alarm activated, and all needs met.   Therapy Documentation Precautions:  Precautions Precautions: Fall Precaution Comments: R AKA, like to wear her L AFO/shoe Restrictions Weight  Bearing Restrictions: Yes RLE Weight Bearing: Non weight bearing  Therapy/Group: Individual Therapy  Brittany Russo 07/17/2019, 7:52 AM

## 2019-07-17 NOTE — Progress Notes (Signed)
Occupational Therapy Session Note  Patient Details  Name: Brittany Russo MRN: [id number] Date of Birth: 1945/10/05  Today's Date: 07/17/2019 OT Individual Time: [id number] OT Individual Time Calculation (min): 70 min    Short Term Goals: Week 2:  OT Short Term Goal 1 (Week 2): STG = LTGs due to remaining LOS  Skilled Therapeutic Interventions/Progress Updates:    Treatment session with focus on family education with pt's husband and daughter, [name].  Pt received upright in w/c with family present and ready for education.  Discussed focus of OT with pt completing LB bathing at bed level with assist and UB seated EOB or at sink with setup.  Discussed recommendation for wide drop arm BSC to provide stability for slide board and to allow for lateral leans during toileting hygiene/clothing management.  Completed slide board transfer to Rt to wide drop arm BSC with mod assist and mod facilitation/cues for weight shifting.  Engaged in lateral leans and boosting from [facility] to simulate clothing management. Completed transfer back to Lt with slide board with min assist, pt demonstrating increased sequencing and weight shifting.  Pt's daughter provided setup for slide board to bed and provided min assist/close supervision transfer to bed.  Discussed setup of home bed and typical routine with transfers in/out of bed with pt's family typically transferring only to Lt side.  Discussed need to complete transfers to Rt and Lt functionally.  Educated on LB dressing with reacher.  Provided hand out with instructions for residual limb wrapping.  Therapist provided education and demonstration for technique to wrap residual limb, due to time constraints pt's family not able to practice.  Pt's daughter verbalzing unsure about technique.  Recommended that they complete with PT and RN today before leaving and possibly again with RN prior to d/c home Weds.    Therapy Documentation Precautions:  Precautions Precautions:  Fall Precaution Comments: R AKA, like to wear her L AFO/shoe Restrictions Weight Bearing Restrictions: Yes RLE Weight Bearing: Non weight bearing General:   Vital Signs: Therapy Vitals Temp: 98.3 F (36.8 C) Pulse Rate: 72 Resp: 20 BP: 139/60 Patient Position (if appropriate): Lying Oxygen Therapy SpO2: 99 % O2 Device: Room Air Pain: Pain Assessment Pain Scale: 0-10 Pain Score: 6  Pain Type: Surgical pain;Acute pain Pain Location: Leg Pain Orientation: Right Pain Descriptors / Indicators: Aching Patients Stated Pain Goal: 4 Pain Intervention(s): Medication (See eMAR)   Therapy/Group: Individual Therapy  Simonne Come 07/17/2019, 3:22 PM

## 2019-07-18 ENCOUNTER — Inpatient Hospital Stay (HOSPITAL_COMMUNITY): Payer: Medicare Other | Admitting: Occupational Therapy

## 2019-07-18 ENCOUNTER — Inpatient Hospital Stay (HOSPITAL_COMMUNITY): Payer: Medicare Other

## 2019-07-18 LAB — COMPREHENSIVE METABOLIC PANEL
ALT: 53 U/L — ABNORMAL HIGH (ref 0–44)
AST: 61 U/L — ABNORMAL HIGH (ref 15–41)
Albumin: 2.1 g/dL — ABNORMAL LOW (ref 3.5–5.0)
Alkaline Phosphatase: 127 U/L — ABNORMAL HIGH (ref 38–126)
Anion gap: 12 (ref 5–15)
BUN: 19 mg/dL (ref 8–23)
CO2: 21 mmol/L — ABNORMAL LOW (ref 22–32)
Calcium: 8.4 mg/dL — ABNORMAL LOW (ref 8.9–10.3)
Chloride: 98 mmol/L (ref 98–111)
Creatinine, Ser: 0.99 mg/dL (ref 0.44–1.00)
GFR calc Af Amer: >60 mL/min (ref >60)
GFR calc non Af Amer: 57 mL/min — ABNORMAL LOW (ref >60)
Glucose, Bld: 136 mg/dL — ABNORMAL HIGH (ref 70–99)
Potassium: 4.6 mmol/L (ref 3.5–5.1)
Sodium: 131 mmol/L — ABNORMAL LOW (ref 135–145)
Total Bilirubin: 1.9 mg/dL — ABNORMAL HIGH (ref 0.3–1.2)
Total Protein: 4.9 g/dL — ABNORMAL LOW (ref 6.5–8.1)

## 2019-07-18 LAB — CBC WITH DIFFERENTIAL/PLATELET
Abs Immature Granulocytes: 0.05 10*3/uL (ref 0.00–0.07)
Basophils Absolute: 0.1 10*3/uL (ref 0.0–0.1)
Basophils Relative: 1 %
Eosinophils Absolute: 0.6 10*3/uL — ABNORMAL HIGH (ref 0.0–0.5)
Eosinophils Relative: 7 %
HCT: 25.8 % — ABNORMAL LOW (ref 36.0–46.0)
Hemoglobin: 7.7 g/dL — ABNORMAL LOW (ref 12.0–15.0)
Immature Granulocytes: 1 %
Lymphocytes Relative: 19 %
Lymphs Abs: 1.6 10*3/uL (ref 0.7–4.0)
MCH: 29.5 pg (ref 26.0–34.0)
MCHC: 29.8 g/dL — ABNORMAL LOW (ref 30.0–36.0)
MCV: 98.9 fL (ref 80.0–100.0)
Monocytes Absolute: 1 10*3/uL (ref 0.1–1.0)
Monocytes Relative: 12 %
Neutro Abs: 5.1 10*3/uL (ref 1.7–7.7)
Neutrophils Relative %: 60 %
Platelets: 134 10*3/uL — ABNORMAL LOW (ref 150–400)
RBC: 2.61 MIL/uL — ABNORMAL LOW (ref 3.87–5.11)
RDW: 19.2 % — ABNORMAL HIGH (ref 11.5–15.5)
WBC: 8.5 10*3/uL (ref 4.0–10.5)
nRBC: 0 % (ref 0.0–0.2)

## 2019-07-18 LAB — PROTIME-INR
INR: 4.9 (ref 0.8–1.2)
INR: 5.4 (ref 0.8–1.2)
Prothrombin Time: 44.1 seconds — ABNORMAL HIGH (ref 11.4–15.2)
Prothrombin Time: 47.8 seconds — ABNORMAL HIGH (ref 11.4–15.2)

## 2019-07-18 LAB — URINALYSIS, ROUTINE W REFLEX MICROSCOPIC

## 2019-07-18 LAB — GLUCOSE, CAPILLARY
Glucose-Capillary: 135 mg/dL — ABNORMAL HIGH (ref 70–99)
Glucose-Capillary: 163 mg/dL — ABNORMAL HIGH (ref 70–99)
Glucose-Capillary: 133 mg/dL — ABNORMAL HIGH (ref 70–99)
Glucose-Capillary: 191 mg/dL — ABNORMAL HIGH (ref 70–99)

## 2019-07-18 LAB — CBC
HCT: 22.3 % — ABNORMAL LOW (ref 36.0–46.0)
Hemoglobin: 6.8 g/dL — CL (ref 12.0–15.0)
MCH: 29.1 pg (ref 26.0–34.0)
MCHC: 30.5 g/dL (ref 30.0–36.0)
MCV: 95.3 fL (ref 80.0–100.0)
Platelets: 123 10*3/uL — ABNORMAL LOW (ref 150–400)
RBC: 2.34 MIL/uL — ABNORMAL LOW (ref 3.87–5.11)
RDW: 18.9 % — ABNORMAL HIGH (ref 11.5–15.5)
WBC: 7.6 10*3/uL (ref 4.0–10.5)
nRBC: 0 % (ref 0.0–0.2)

## 2019-07-18 LAB — AMMONIA: Ammonia: 34 umol/L (ref 9–35)

## 2019-07-18 LAB — PREPARE RBC (CROSSMATCH)

## 2019-07-18 LAB — URINALYSIS, MICROSCOPIC (REFLEX): RBC / HPF: >50 RBC/hpf (ref 0–5)

## 2019-07-18 MED ORDER — PHYTONADIONE 5 MG PO TABS
5.0000 mg | ORAL_TABLET | Freq: Once | ORAL | Status: AC
  Administered 2019-07-18: 5 mg via ORAL
  Filled 2019-07-18: qty 1

## 2019-07-18 MED ORDER — FUROSEMIDE 20 MG PO TABS
20.0000 mg | ORAL_TABLET | Freq: Once | ORAL | Status: AC
  Administered 2019-07-19: 20 mg via ORAL
  Filled 2019-07-18: qty 1

## 2019-07-18 MED ORDER — TRAMADOL HCL 50 MG PO TABS
50.0000 mg | ORAL_TABLET | Freq: Four times a day (QID) | ORAL | Status: DC | PRN
  Administered 2019-07-19 – 2019-07-27 (×9): 50 mg via ORAL
  Filled 2019-07-18 (×11): qty 1

## 2019-07-18 MED ORDER — SODIUM CHLORIDE 0.9% IV SOLUTION: Freq: Once | INTRAVENOUS | Status: AC

## 2019-07-18 MED ORDER — LACTULOSE 10 GM/15ML PO SOLN
30.0000 g | Freq: Two times a day (BID) | ORAL | Status: DC
  Administered 2019-07-18 – 2019-07-21 (×6): 30 g via ORAL
  Filled 2019-07-18 (×7): qty 45

## 2019-07-18 MED ORDER — HYDROCODONE-ACETAMINOPHEN 7.5-325 MG PO TABS
1.0000 | ORAL_TABLET | Freq: Two times a day (BID) | ORAL | Status: DC | PRN
  Administered 2019-07-18 – 2019-07-27 (×10): 1 via ORAL
  Filled 2019-07-18 (×12): qty 1

## 2019-07-18 NOTE — Progress Notes (Signed)
Spoke with daughter [name] who expressed concerns over information her mom shared over the phone. Brittany Russo that patient was due to receive FFP and RBCs due to decreased hgb and increased INR. Informed her that patient was in no acute distress and night shift nurse would be available for any questions or concerns. She stated she would stay on "stand by" to receive news about pending discharge for 07/19/19.

## 2019-07-18 NOTE — Progress Notes (Addendum)
Heard OT expressing concerns to nurse that patient is more sleepy today and appears to have declined requiring mod to max assist. Went to check on patient who was sleepy and concerned about "all the blood" in her stomach--discussed that bleeding was from the bladder and little can go a long way when diluted. H/H has been stable but concerned about hepatic encephalopathy with last ammonia level trending upwards. Labs ordered and show NH3 lower but LFTs trending up with OEU2.9.   Discussed with Dr. Scharlene Corn accuracy of INR as well question of bleed if mobility worsening. Full infections work up with CXR, UA/UCS to monitor efficacy of UTI treatment as well as repeat CBC to monitor H/H and repeat INR. Stat labs ordered and still pending. PVRs ordered and boderline--most recent PVR 260cc on bedpan and she refused to get to [facility].  I did go and encouraged patient to get to [facility] to help empty--she was alert/awake and eating supper and will try to get to [facility] after her meal. Updated her on current work up that is underway. If INR still elevated will need CT head to rule out bleed as well as input from hem/onc for management.

## 2019-07-18 NOTE — Progress Notes (Signed)
Occupational Therapy Session Note  Patient Details  Name: Brittany Russo MRN: [id number] Date of Birth: 1945/05/20  Today's Date: 07/18/2019 OT Individual Time: 3403-7096 OT Individual Time Calculation (min): 71 min    Short Term Goals: Week 1:  OT Short Term Goal 1 (Week 1): Pt will complete toilet transfers to drop arm BSC with mod assist OT Short Term Goal 1 - Progress (Week 1): Met OT Short Term Goal 2 (Week 1): Pt will complete LB dressing with mod assist with lateral leans vs sit > stand OT Short Term Goal 2 - Progress (Week 1): Met OT Short Term Goal 3 (Week 1): Pt will complete toileting tasks with mod assist OT Short Term Goal 3 - Progress (Week 1): Progressing toward goal Week 2:  OT Short Term Goal 1 (Week 2): STG = LTGs due to remaining LOS  Skilled Therapeutic Interventions/Progress Updates:  Patient met lying supine in agreement with OT treatment session. RN present from [phone] to wrap R residual limb and administer medication. Patient c/o phantom limb pain in R residual limb at rest. Supine to sit EOB with Min A, increased time/effort, use of bed features and HOB elevated. LB bathing in supine with patient able to wash L upper leg, R upper leg, and buttocks. Once seated EOB, R residual limb wrap fell off. Return to supine with Min A for assisting LLE from EOB to bed surface. OT replaced wraps on residual limb with anchor ace bandage around waist. Return to EOB with Min A and SB transfer to wc on R with Min A and cues for hand placement. OT donned L TED hose and AFO with total A. Patient indicated need to void with SB transfer to commode in bathroom with Mod A and cueing for hand placement, foot placement and head/hip relationship. Patient unable to successfully void noting only blood present in commode. RN aware. UB bathing/dressing seated on BSC with set-up assist. Patient able to thread LLE and R residual limb through LB clothing seated on BSC with use of reacher and assistance  to pull pants over L AFO. Sit to stand from [facility] with Max A. Patient unable to assist hiking brief/pants over hips in standing. SB transfer back to wc on L with Mod A. Session concluded with patient seated in wc with call bell within reach, bed alarm activated and all needs met.   Therapy Documentation Precautions:  Precautions Precautions: Fall Precaution Comments: R AKA, likes to wear her L AFO/shoe Restrictions Weight Bearing Restrictions: Yes RLE Weight Bearing: Non weight bearing  Therapy/Group: Individual Therapy  Carol Loftin [name] 07/18/2019, 7:46 AM

## 2019-07-18 NOTE — Progress Notes (Signed)
Occupational Therapy Discharge Summary  Patient Details  Name: Brittany Russo MRN: [id number] Date of Birth: 09/20/1945    Patient has met 7 of 8 long term goals due to improved activity tolerance, improved balance, postural control, ability to compensate for deficits and improved awareness.  Patient to discharge at overall Min-Max assist level.  Patient's care partner is independent to provide the necessary physical and cognitive assistance at discharge.  Patient's daughter completed hands on education with focus on toilet transfers, toileting, and residual limb care.  Patient's daughter demonstrated good awareness and ability to assist with slide board transfers.  Patient's daughter would benefit from additional practice with residual limb care/wrapping - handouts provided and encouraged additional RN education prior to d/c from hospital  Reasons goals not met: Pt did not meet transfer goal as it was N/A and pt reports does not plan to bathe at shower level until residual limb completely healed.  Recommendation:  Patient will benefit from ongoing skilled OT services in home health setting to continue to advance functional skills in the area of BADL and Reduce care partner burden.  Equipment: wide drop arm BSC  Reasons for discharge: treatment goals met and discharge from hospital  Patient/family agrees with progress made and goals achieved: Yes  OT Discharge Precautions/Restrictions  Precautions Precautions: Fall Precaution Comments: R AKA, likes to wear her L AFO/shoe General OT Amount of Missed Time: 7 Minutes Vital Signs Therapy Vitals Temp: 98.3 F (36.8 C) Temp Source: Oral Pulse Rate: 74 Resp: 18 BP: (!) 152/57 Patient Position (if appropriate): Sitting Oxygen Therapy SpO2: 100 % O2 Device: Room Air Pain Pain Assessment Pain Scale: 0-10 Pain Score: 0-No pain ADL ADL Eating: Independent Grooming: Modified independent Upper Body Bathing: Setup Where  Assessed-Upper Body Bathing: Sitting at sink Lower Body Bathing: Minimal assistance Where Assessed-Lower Body Bathing: Bed level Upper Body Dressing: Setup Where Assessed-Upper Body Dressing: Sitting at sink Lower Body Dressing: Moderate assistance Where Assessed-Lower Body Dressing: Bed level Toileting: Moderate assistance, Maximal assistance Where Assessed-Toileting: Bedside Commode, Bed level(uses bedpan and BSC) Toilet Transfer: Moderate assistance Toilet Transfer Method: Theatre manager: Extra wide drop arm bedside commode Vision Baseline Vision/History: Wears glasses Wears Glasses: Reading only Patient Visual Report: No change from baseline Vision Assessment?: No apparent visual deficits Perception  Perception: Within Functional Limits Praxis Praxis: Intact Cognition Overall Cognitive Status: Within Functional Limits for tasks assessed Arousal/Alertness: Awake/alert Orientation Level: Oriented X4 Attention: Selective Selective Attention: Appears intact Memory: Appears intact Awareness: Appears intact Problem Solving: Appears intact Safety/Judgment: Appears intact Comments: pt is fearful of falling and anxious Sensation Sensation Light Touch: Impaired Detail Peripheral sensation comments: decreased along incision on R residual limb Light Touch Impaired Details: Impaired RLE Proprioception: Appears Intact Coordination Gross Motor Movements are Fluid and Coordinated: No Fine Motor Movements are Fluid and Coordinated: Yes Coordination and Movement Description: grossly uncoordinated due to R AKA, generalized weakness, anxiety, and fatigue Finger Nose Finger Test: [facility] Extremity/Trunk Assessment RUE Assessment RUE Assessment: Within Functional Limits LUE Assessment LUE Assessment: Within Functional Limits   Merlinda Wrubel, [facility] 07/18/2019, 3:49 PM

## 2019-07-18 NOTE — Progress Notes (Signed)
FFP started pt tolerating well. Medicated for phantom pain. RN remains in room.

## 2019-07-18 NOTE — Progress Notes (Signed)
Occupational Therapy Session Note  Patient Details  Name: Brittany Russo MRN: [id number] Date of Birth: [birth year]  Today's Date: 07/18/2019 OT Individual Time: 3545-6256 OT Individual Time Calculation (min): 68 min  and Today's Date: 07/18/2019 OT Missed Time: 7 Minutes Missed Time Reason: Patient fatigue   Short Term Goals: Week 2:  OT Short Term Goal 1 (Week 2): STG = LTGs due to remaining LOS  Skilled Therapeutic Interventions/Progress Updates:    Pt received supine in bed nodding off.  Pt easily awakened to sound, but would frequently nod off during session.  Pt reports increased fatigue this session but willing to attempt therapy.  Engaged in bed mobility with mod assist to come to sitting at EOB.  While seated EOB pt with LOB backwards requiring max assist to maintain sitting balance.  Pt aware of LOB and difficulty maintaining sitting balance, reports if she felt like this at home she would just stay in bed.  Returned to supine with min assist and pt able to boost self up with use of hospital bed functions.  Engaged in education on residual limb care; reiterating wrapping technique, use of inspection mirror, and desensitization strategies.  Provided pt with HEP with theraband to continue to address BUE strengthening as needed for transfers and sit > stand for hygiene.  Therapist spoke with RN about therapist concerns with pt demonstrating decreased endurance and ability to maintain fully aroused and engaged during session.  Notified PA as well.  Pt remained supine in bed with lights off falling asleep before therapist exited.  Therapy Documentation Precautions:  Precautions Precautions: Fall Precaution Comments: R AKA, likes to wear her L AFO/shoe Restrictions Weight Bearing Restrictions: Yes RLE Weight Bearing: Non weight bearing General: General OT Amount of Missed Time: 7 Minutes Vital Signs: Therapy Vitals Temp: 98.3 F (36.8 C) Temp Source: Oral Pulse Rate: 74 Resp:  18 BP: (!) 152/57 Patient Position (if appropriate): Sitting Oxygen Therapy SpO2: 100 % O2 Device: Room Air Pain: Pt with no c/o pain  Therapy/Group: Individual Therapy  Simonne Come 07/18/2019, 3:25 PM

## 2019-07-18 NOTE — Progress Notes (Signed)
Reviewed risks and benefits for receiving blood products. Pt agreed to treatment plan. Educated regarding Renal US. IV team inserted PIV.

## 2019-07-18 NOTE — Progress Notes (Addendum)
[facility] PHYSICAL MEDICINE & REHABILITATION PROGRESS NOTE  Subjective/Complaints: Patient seen laying in bed this AM.  She states she slept well overnight.  Discussed stump dressing with nursing.  She was seen by surgery, notes reviewed-changes in dressing recommended  ROS: Denies CP, SOB, N/V/D  Objective: Vital Signs: Blood pressure 108/61, pulse 72, temperature 98.4 F (36.9 C), temperature source Oral, resp. rate 18, height 5' 5"  (1.651 m), weight 73.6 kg, SpO2 96 %. No results found. Recent Labs    07/17/19 0609 07/18/19 0606  WBC 6.7 8.5  HGB 7.9* 7.7*  HCT 25.4* 25.8*  PLT 129* 134*   Recent Labs    07/17/19 0609  NA 130*  K 4.4  CL 95*  CO2 26  GLUCOSE 147*  BUN 13  CREATININE 0.87  CALCIUM 8.2*    Physical Exam: BP 108/61 (BP Location: Right Arm)   Pulse 72   Temp 98.4 F (36.9 C) (Oral)   Resp 18   Ht 5' 5"  (1.651 m)   Wt 73.6 kg   SpO2 96%   BMI 27.00 kg/m  Constitutional: No distress . Vital signs reviewed. HENT: Normocephalic.  Atraumatic. Eyes: EOMI. No discharge. Cardiovascular: No JVD. Respiratory: Normal effort.  No stridor. GI: Non-distended. Skin: Right AKA with staples and serosanguineous drainage.  Erythema along staple lines and hematoma along posterior flap. Psych: Slightly confused. Musc: Right AKA with edema and tenderness Neuro: Alert Motor:  Left lower extremity: Hip flexion, knee extension 4/5, ankle dorsiflexion tight heelcord, but able to dorsiflex, 4-/5 ADF Right lower extremity: Hip flexion 4/5 (some pain inhibition), improving  Assessment/Plan: 1. Functional deficits secondary to right AKA which require 3+ hours per day of interdisciplinary therapy in a comprehensive inpatient rehab setting.  Physiatrist is providing close team supervision and 24 hour management of active medical problems listed below.  Physiatrist and rehab team continue to assess barriers to discharge/monitor patient progress toward functional and  medical goals  Care Tool:  Bathing    Body parts bathed by patient: Right arm, Left arm, Chest, Abdomen, Front perineal area, Right upper leg, Left upper leg, Left lower leg, Face   Body parts bathed by helper: Buttocks Body parts n/a: Right lower leg   Bathing assist Assist Level: Moderate Assistance - Patient 50 - 74%     Upper Body Dressing/Undressing Upper body dressing   What is the patient wearing?: Pull over shirt    Upper body assist Assist Level: Set up assist    Lower Body Dressing/Undressing Lower body dressing      What is the patient wearing?: Pants     Lower body assist Assist for lower body dressing: Moderate Assistance - Patient 50 - 74%     Toileting Toileting    Toileting assist Assist for toileting: Moderate Assistance - Patient 50 - 74% Assistive Device Comment: BSC in bathroom   Transfers Chair/bed transfer  Transfers assist  Chair/bed transfer activity did not occur: Safety/medical concerns  Chair/bed transfer assist level: Minimal Assistance - Patient > 75%     Locomotion Ambulation   Ambulation assist   Ambulation activity did not occur: Safety/medical concerns          Walk 10 feet activity   Assist  Walk 10 feet activity did not occur: Safety/medical concerns        Walk 50 feet activity   Assist Walk 50 feet with 2 turns activity did not occur: Safety/medical concerns         Walk 150 feet  activity   Assist Walk 150 feet activity did not occur: Safety/medical concerns         Walk 10 feet on uneven surface  activity   Assist Walk 10 feet on uneven surfaces activity did not occur: Safety/medical concerns         Wheelchair     Assist Will patient use wheelchair at discharge?: Yes Type of Wheelchair: Manual    Wheelchair assist level: Supervision/Verbal cueing Max wheelchair distance: 16f    Wheelchair 50 feet with 2 turns activity    Assist        Assist Level:  Supervision/Verbal cueing   Wheelchair 150 feet activity     Assist     Assist Level: Supervision/Verbal cueing      Medical Problem List and Plan: 1.  Impaired function, ADLs and mobility secondary to R AKA due to failed TKRs  Continue CIR   Call patient's daughter and discussed concerns regarding hematuria-educated on etiologies and continued plan for follow-up with urology as outpatient 2.  Antithrombotics: -DVT/anticoagulation:  Cannot do lovenox due to low platelets             -antiplatelet therapy: N/A 3. Pain Management: Tramadol 50 mg qid with robaxin/Hydrocodone prn  Phantom limb pain    Gabapentin 100 BID started on 4/13, changed to 300 mg nightly on 4/15, changed to twice daily on 4/19, tolerating  Controlled with meds on 4/27 4. Mood: LCSW to follow for evaluation and support.              -antipsychotic agents: N/A 5. Neuropsych: This patient is ?fully capable of making decisions on her own behalf.  Discussed admission with neuropsych, appreciate evaluation and recs 6. Skin/Wound Care: Monitor wound for healing once surgical dressing removed.   ABD pad, with Kerlix and Ace dressings to AKA. 7. Fluids/Electrolytes/Nutrition: Monitor I/Os 8. Cirrhosis of the liver: Continue Xifaxan.  Hyperammonemia - clinically improving   Lactulose started on 4/14, stopped due to loose stools on 4/18 9. ABLA:   Hemoglobin 7.7 on 4/27  Continue to monitor 10. Chronic Thrombocytopenia: Monitor for signs of bleeding. Baseline at 74-->51.   Platelets 134 on 4/27  Continue to monitor 11. Chronic hyponatremia: Continue to monitor.   Sodium 130 on 4/26  Continue to monitor 12. T2DM with hyperglycemia: Hgb A1c-5.2.   Glucotrol 2.5 twice daily, discussed with pharmacy DC on 4/23  Continue Tradjenta 5 mg daily  Continue metformin   Lantus 20 units daily, decreased to 15 units on 4/18, discussed with pharmacy, [place] 4/23  Slightly labile on 4/27 CBG (last 3)  Recent Labs     07/17/19 1717 07/17/19 2101 07/18/19 0630  GLUCAP 132* 142* 135*  13. H/o depression: ON Lexapro, Xanax and Wellbutrin. 14.  Labile blood pressure  Labile on 4/23, monitor trend 15.  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  Noted to have clots, discussed with urology, continue to encourage fluids for bladder irrigation as well as antibiotics  No other intervention at present 16. Acute lower UTI  UA +, Ucx >100K Citrobacter  Continue Macrobid through 4/28  LOS: 14 days A FACE TO FACE EVALUATION WAS PERFORMED  Eriverto Byrnes ALorie Phenix4/27/2021, 8:09 AM

## 2019-07-18 NOTE — Consult Note (Signed)
[facility] Consultation  Brittany Russo TKZ:[id number] DOB: [birth year] DOA: 07/04/2019 PCP: Rosine Door   Requesting physician: Dr. Posey Pronto Date of consultation: 07/18/2019 Reason for consultation: Elevated INR, hematuria  Impression/Recommendations Principal Problem:   Amputation above knee ([place]) Active Problems:   DM2 (diabetes mellitus, type 2) ([place])   Cirrhosis ([place])   Hematuria   Controlled type 2 diabetes mellitus with hyperglycemia, with long-term current use of insulin (HCC)   Hyponatremia   Thrombocytopenia (HCC)   Phantom limb pain (HCC)   Postoperative pain   Labile blood pressure   Labile blood glucose   Anxiety state   Acute lower UTI   Radiation cystitis    1. Coagulopathy/elevated INR, most likely related to impaired hepatic synthetic function, as albumin level also low, given the fact that patient hemoglobin dropped with ongoing hematuria, needing emergent transfusion tonight, will give FFP plus vitamin K tonight.  Check INR tomorrow morning, may need to go home on p.o. vitamin K supplement 2. Hematuria/UTI, patient reported she has had frequent UTIs/hematuria, since she had endometrial cancer hysterectomy and radiation therapy.  Each time she was treated with antibiotics.  This time patient had bloody urine before surgery more than 2 weeks ago, despite on Macrobid, she continues to have hematuria which was pinkish initially, now in the last 2 days, the urine color has become more bloody, with streaks of clots.  She admits to only occasional dysuria, and mild bilateral back pains.  No fever or chills.  Given significant history of pelvic irradiation after hysterectomy, and repeated hematuria, suspect radiation cystitis, which worsened by coagulopathy from liver cirrhosis.  As mentioned above patient will get FFP and vitamin K tonight, hopefully once coagulopathy improved her hematuria will subside, otherwise we will have to consider inpatient  urology consult and bladder irrigation?  We will check renal ultrasound to rule out any kidney/ureter stones 3. Acute on chronic iron deficieny anemia, 1 packed RBC tonight, followed by Lasix, recheck CBC in the morning 4. NASH with cirrhosis, she is on beta-blocker and rifaximin, euvolemic, as needed diuresis 5. Right knee AKA, as per primary team 6. Hyponatremia, probably related to cirrhosis  I will followup again tomorrow. Please contact me if I can be of assistance in the meanwhile. Thank you for this consultation.  Chief Complaint: Bloody urine  HPI:  74 year old, with history of right knee TKR complicated with infection and underwent amputation after failing conservative management, IDDM, history of endometrial cancer status post hysterectomy, frequent UTI/hematuria, question of radiation colitis, who was in the rehab unit after the amputation.  Patient started to have hematuria about 3 weeks ago before the knee amputation, she was started on Macrobid 7 days ago, however the patient reported urine culture has been more bloody over the last 2 days, with streaks of blood clots.  She also has some vague bilateral lower back pain.  Patient also reported easy gum bleeding and easy bruising.  She was diagnosed with NASH, and mainly managed by her PCP, but she was never scoped. Review of Systems:  10 point review system done negative except those mentioned in HPI  Past Medical History:  Diagnosis Date  . Acute and subacute hepatic failure without coma   . Allergy   . Anasarca   . Anxiety   . Arthritis   . Ascites 09/2018   no SBP on 850 cc tap 10/13/18  . Asthma    allery induced  . Cirrhosis of liver ([place]) ~ 2004  from NAFLD.  Dx ~ 2004.  Dr Dorcas Mcmurray at [facility]/    . Depression   . Diabetes mellitus without complication ([place])    type 2  . Eczema of both hands   . Endometrial cancer ([place]) 1996, 2003   hysterectomy 1996.  recurrence 2003, treated with radiation.    . Generalized edema    . GI hemorrhage   . 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 in [place]  . Hyperlipidemia   . Hypertension   . Muscle weakness   . Seizures ([place])    last seizure June 07, 2014   . Thrombocytopenia ([place])   . Thyroid nodule    Past Surgical History:  Procedure Laterality Date  . AMPUTATION Right 06/29/2019   Procedure: AMPUTATION ABOVE KNEE through knee;  Surgeon: Paralee Cancel, MD;  Location: [facility];  Service: Orthopedics;  Laterality: Right;  2 hrs  . CATARACT EXTRACTION W/PHACO Left 06/04/2017   Procedure: CATARACT EXTRACTION PHACO AND INTRAOCULAR LENS PLACEMENT (IOC);  Surgeon: Baruch Goldmann, MD;  Location: [facility];  Service: Ophthalmology;  Laterality: Left;  CDE: 3.90  . CATARACT EXTRACTION W/PHACO Right 07/15/2017   Procedure: CATARACT EXTRACTION PHACO AND INTRAOCULAR LENS PLACEMENT (IOC);  Surgeon: Baruch Goldmann, MD;  Location: [facility];  Service: Ophthalmology;  Laterality: Right;  CDE: 7.60  . COLONOSCOPY  06/2007   in [place]. for hematochzia.  radiation proctitis  . DILATION AND CURETTAGE OF UTERUS    . ESOPHAGOGASTRODUODENOSCOPY  2016  . ESOPHAGOGASTRODUODENOSCOPY (EGD) WITH PROPOFOL N/A 12/30/2018   Procedure: EGD;  Surgeon: Clarene Essex, MD;  Location[facility];  Service: Endoscopy;  Laterality: N/A;  . EXCISIONAL TOTAL KNEE ARTHROPLASTY Right 01/03/2019   Procedure: EXCISIONAL TOTAL KNEE ARTHROPLASTY;  Surgeon: Paralee Cancel, MD;  [facility]: [facility];  Service: Orthopedics;  Laterality: Right;  . FLEXIBLE SIGMOIDOSCOPY N/A 12/30/2018   Procedure: FLEXIBLE SIGMOIDOSCOPY;  Surgeon: Clarene Essex, MD;  [facility];  Service: Endoscopy;  Laterality: N/A;  . IR PARACENTESIS  10/13/2018  . STERIOD INJECTION Left 06/29/2019   Procedure: LEFT KNEE INJECTION;  Surgeon: Paralee Cancel, MD;  [facility];  Service: Orthopedics;  Laterality: Left;  . TOTAL ABDOMINAL HYSTERECTOMY  1996  . TOTAL KNEE ARTHROPLASTY Right  09/22/2018   Procedure: TOTAL KNEE ARTHROPLASTY;  Surgeon: Paralee Cancel, MD;  [facility];  Service: Orthopedics;  Laterality: Right;  70 mins  . TOTAL KNEE REVISION Right 09/27/2018   Procedure: TOTAL KNEE REVISION;  Surgeon: Paralee Cancel, MD;  [facility];  Service: Orthopedics;  Laterality: Right;  . TOTAL KNEE REVISION Right 11/22/2018   Procedure: repair right knee extensor mechanism;  Surgeon: Paralee Cancel, MD;  Location: [facility];  Service: Orthopedics;  Laterality: Right;  90 mins   Social History:  reports that she has never smoked. She has never used smokeless tobacco. She reports that she does not drink alcohol or use drugs.  Allergies  Allergen Reactions  . Asa [Aspirin] Other (See Comments)    Pt not allergic but cannot take due to bleeding   . Other Other (See Comments)    Any jewelry metal-hives and itching    Family History  Problem Relation Age of Onset  . Heart disease Mother   . Diabetes Mother   . Heart disease Father   . Cancer Father   . Cancer Maternal Grandmother   . Heart disease Maternal Grandfather     Prior to Admission medications  Medication Sig Start Date End Date Taking? Authorizing Provider  acetaminophen (TYLENOL) 325 MG tablet Take 1-2 tablets (325-650 mg total) by mouth every 4 (four) hours as needed for mild pain. 07/13/19   Love, Ivan Anchors, PA-C  acetaminophen (TYLENOL) 500 MG tablet Take 1-2 tablets (500-1,000 mg total) by mouth every 8 (eight) hours as needed. 07/03/19   Danae Orleans, PA-C  albuterol (VENTOLIN HFA) 108 (90 Base) MCG/ACT inhaler Inhale 1-2 puffs into the lungs every 6 (six) hours as needed for wheezing or shortness of breath.    [provider]  ALPRAZolam Duanne Moron) 0.25 MG tablet Take 1 tablet (0.25 mg total) by mouth 2 (two) times daily. 01/06/19   Mariel Aloe, MD  atenolol (TENORMIN) 50 MG tablet Take 50 mg by mouth daily.    [provider]  atorvastatin (LIPITOR) 80 MG tablet Take 80 mg by mouth  daily at 8 pm.    [provider]  buPROPion (WELLBUTRIN XL) 150 MG 24 hr tablet Take 150 mg by mouth daily.    [provider]  docusate sodium (COLACE) 100 MG capsule Take 1 capsule (100 mg total) by mouth 2 (two) times daily. 07/03/19   Danae Orleans, PA-C  escitalopram (LEXAPRO) 20 MG tablet Take 20 mg by mouth daily. 05/17/19   [provider]  ferrous sulfate (FERROUSUL) 325 (65 FE) MG tablet Take 1 tablet (325 mg total) by mouth 3 (three) times daily with meals for 14 days. 07/03/19 07/17/19  Danae Orleans, PA-C  fluticasone (FLONASE) 50 MCG/ACT nasal spray Place 1 spray into both nostrils daily.     [provider]  gabapentin (NEURONTIN) 300 MG capsule Take 300 mg by mouth daily as needed (pain).     [provider]  glipiZIDE (GLUCOTROL) 5 MG tablet Take 5 mg by mouth 2 (two) times daily.    [provider]  glucagon (GLUCAGON EMERGENCY) 1 MG injection Inject 1 mg into the muscle once as needed (blood sugar less than 60).    [provider]  Insulin Glargine (BASAGLAR KWIKPEN) 100 UNIT/ML Inject 20-30 Units into the skin daily.    [provider]  lactulose (CHRONULAC) 10 GM/15ML solution Take 30 mLs (20 g total) by mouth 2 (two) times daily. Patient taking differently: Take 10 g by mouth 2 (two) times a week.  01/06/19   Mariel Aloe, MD  lip balm (CARMEX) ointment Apply topically as needed for lip care. 07/13/19   Love, Ivan Anchors, PA-C  loratadine (CLARITIN) 10 MG tablet Take 10 mg by mouth daily.    [provider]  methocarbamol (ROBAXIN) 500 MG tablet Take 1 tablet (500 mg total) by mouth every 6 (six) hours as needed for muscle spasms. 07/03/19   Danae Orleans, PA-C  Multiple Vitamin (MULTIVITAMIN WITH MINERALS) TABS tablet Take 1 tablet by mouth daily. 12/06/18   Danae Orleans, PA-C  polyethylene glycol (MIRALAX / GLYCOLAX) 17 g packet Take 17 g by mouth 2 (two) times daily. 07/03/19   Danae Orleans,  PA-C  rifaximin (XIFAXAN) 550 MG TABS tablet Take 550 mg by mouth 2 (two) times daily.    [provider]  rivaroxaban (XARELTO) 10 MG TABS tablet Take 1 tablet (10 mg total) by mouth daily. 07/03/19   Danae Orleans, PA-C  sitaGLIPtin-metformin (JANUMET) 50-500 MG per tablet Take 1 tablet by mouth 2 (two) times daily with a meal.    [provider]  traMADol (ULTRAM) 50 MG tablet Take 1-2 tablets (50-100 mg  total) by mouth every 6 (six) hours as needed (1 pill scale 1-5; 2 pills scale 6-10). 07/03/19 07/02/20  Danae Orleans, PA-C  traZODone (DESYREL) 50 MG tablet Take 50 mg by mouth at bedtime as needed for sleep.  05/17/19   [provider]  Ibuprofen-Diphenhydramine Cit (ADVIL PM PO) Take 60 mg by mouth at bedtime.  05/21/17  [provider]   Physical Exam: Blood pressure (!) 152/57, pulse 74, temperature 98.3 F (36.8 C), temperature source Oral, resp. rate 18, height 5' 5"  (1.651 m), weight 73.6 kg, SpO2 100 %. Vitals:   07/18/19 0510 07/18/19 1327  BP: 108/61 (!) 152/57  Pulse: 72 74  Resp: 18 18  Temp: 98.4 F (36.9 C) 98.3 F (36.8 C)  SpO2: 96% 100%     General: Looks tired and pale  Eyes: No jaundice  ENT: No rash  Neck: No JVD  Cardiovascular: The rate and rhythm, no murmurs  Respiratory: Clear breathing bilaterally no crackles no wheezing  Abdomen: Soft nontender nondistended  Skin: No rash no ulcer, multiple bruises on elbows and forearms  Musculoskeletal: Normal muscle tone  Psychiatric: Calm  Neurologic: No focal deficit  Labs on Admission:  Basic Metabolic Panel: Recent Labs  Lab 07/12/19 0533 07/17/19 0609 07/18/19 1442  NA 134* 130* 131*  K 4.0 4.4 4.6  CL 100 95* 98  CO2 27 26 21*  GLUCOSE 73 147* 136*  BUN 10 13 19   CREATININE 0.65 0.87 0.99  CALCIUM 8.2* 8.2* 8.4*   Liver Function Tests: Recent Labs  Lab 07/18/19 1442  AST 61*  ALT 53*  ALKPHOS 127*  BILITOT 1.9*  PROT 4.9*  ALBUMIN 2.1*    No results for input(s): LIPASE, AMYLASE in the last 168 hours. Recent Labs  Lab 07/18/19 1423  AMMONIA 34   CBC: Recent Labs  Lab 07/14/19 0800 07/15/19 0855 07/17/19 0609 07/18/19 0606 07/18/19 1803  WBC 3.5* 5.3 6.7 8.5 7.6  NEUTROABS 2.4 3.7  --  5.1  --   HGB 8.1* 7.8* 7.9* 7.7* 6.8*  HCT 26.6* 25.2* 25.4* 25.8* 22.3*  MCV 95.3 93.7 97.7 98.9 95.3  PLT 76* 99* 129* 134* 123*   Cardiac Enzymes: No results for input(s): CKTOTAL, CKMB, CKMBINDEX, TROPONINI in the last 168 hours. BNP: Invalid input(s): POCBNP CBG: Recent Labs  Lab 07/17/19 1717 07/17/19 2101 07/18/19 0630 07/18/19 1148 07/18/19 1711  GLUCAP 132* 142* 135* 163* 133*    Radiological Exams on Admission: DG Chest 2 View  Result Date: 07/18/2019 CLINICAL DATA:  Lethargy. Dizziness and nausea. EXAM: CHEST - 2 VIEW COMPARISON:  Chest CT 03/28/2017 FINDINGS: Moderate right pleural effusion with fluid tracking laterally and in the fissure. Mild cardiomegaly. Aortic atherosclerosis and tortuosity. Vascular congestion without pulmonary edema. No left pleural effusion. No pneumothorax. No acute osseous abnormalities are seen. IMPRESSION: 1. Moderate right pleural effusion with fluid tracking laterally and in the fissure. 2. Mild cardiomegaly with vascular congestion. Electronically Signed   By: Keith Rake M.D.   On: 07/18/2019 18:44    EKG: Independently reviewed.   Time spent: [facility] Pager [phone]   07/18/2019, 8:02 PM

## 2019-07-18 NOTE — Progress Notes (Signed)
Pt transferred via bed to [place]

## 2019-07-18 NOTE — Care Management (Addendum)
   The overall goal for the admission was met for:   Discharge location: Home with spouse and daughter to assist with care  Length of Stay: 24 days with discharge 07/28/19  Discharge activity level: Patient to discharge at a wheelchair level Min assist.  Home/community participation: Limited Participation  Services provided included: MD, RD, PT, OT, SLP, RN, CM, TR, Pharmacy, Neuropsych and SW  Financial Services: Medicare, Medicaid and Private Insurance: [place]  Follow-up services arranged: Home Health: PT, OT, DME: [facility] and Patient/Family request agency HH: [facility], DME: None  Comments (or additional information): [facility] [phone]  Patient/Family verbalized understanding of follow-up arrangements: Yes   Patient's care partner is independent to provide the necessary physical assistance at discharge. Pt's husband and daughter attended family education training on 4/26 and verbalized/demonstrated confidence with all tasks/transfers for discharge home  Review of family education on 07/27/19 with daughter [name] due to extension of discharge for resolution of medical issues. Patient's care partner is independent to provide the necessary physical assistance at discharge.   Individual responsible for coordination of the follow-up plan: Spouse: [name] [phone] Daughter [phone]  Confirmed correct DME delivered: Margarito Liner 07/18/2019    Transportation to home arranged with [facility] per family request.   Margarito Liner

## 2019-07-18 NOTE — Progress Notes (Signed)
Physical Therapy Discharge Summary  Patient Details  Name: Brittany Russo MRN: [id number] Date of Birth: 07/19/1945  Today's Date: 07/18/2019 PT Individual Time: 1497-0263 PT Individual Time Calculation (min): 59 min   Patient has met 8 of 8 long term goals due to improved activity tolerance, improved balance, improved postural control, increased strength, decreased pain, improved awareness and improved coordination. Patient to discharge at a wheelchair level CGA/min assist. Patient's care partner is independent to provide the necessary physical assistance at discharge. Pt's husband and daughter attended family education training on 4/26 and verbalized/demonstrated confidence with all tasks/transfers for discharge home. Pt's daughter practiced hands on training wrapping residual limb and assisting with toileting, and daughter and husband demonstrated confidence with slideboard transfers. Pt performed simulated car transfer with therapist and family observed, however, pt's family declined practicing simulated car transfer but verbalized confidence.   All goals met  Recommendation:  Patient will benefit from ongoing skilled PT services in home health setting to continue to advance safe functional mobility, address ongoing impairments in transfers, LE/UE strength, dynamic sitting/standing balance, amputee education, endurance, and to minimize fall risk.  Equipment: No equipment provided  Reasons for discharge: treatment goals met  Patient/family agrees with progress made and goals achieved: Yes  Today's interventions: Received pt sitting in [place], pt agreeable to therapy, and denied any pain during session. Session focused on discharge planning, functional mobility/transfers, LE/UE strength, dynamic sitting balance/coordination, amputee education, simulated car transfers, and improved activity tolerance. Pt transferred WC<>bed and bed<>WC via slideboard with CGA. Pt performed bed mobility mod I  using bedrails. However, pt has hospital bed at home. Pt donned pull over shirt sitting in WC with min A. Pt's husband present to drop off fresh clothes and therapist inspected pt's personal WC due to husband reporting broken piece. Therapist educated pt and pt's husband that the broken piece did not affect the function and safety of the WC and could be easily replaced. Pt performed WC mobility 149f using bilateral UEs independently to ortho gym. Pt performed simulated car transfer with slideboard and min A with increased time. Pt continues to requires cues for anterior weight shifting, head/hips relationship when scooting, and hand placement when transferring. Pt required multiple rest breaks throughout session due to increased fatigue. Pt performed bilateral UE strengthening on UBE at level 2.5 for 3 minutes forwards and 3 minutes backwards with supervision. Pt required multiple rest breaks throughout session due to increased fatigue. Pt performed WC mobility 1074fusing bilateral UEs independently back to room. Concluded session with pt sitting in WC, needs within reach, and seatbelt alarm on.   PT Discharge Precautions/Restrictions Precautions Precautions: Fall Precaution Comments: R AKA, likes to wear her L AFO/shoe Restrictions Weight Bearing Restrictions: Yes RLE Weight Bearing: Non weight bearing Cognition Overall Cognitive Status: Within Functional Limits for tasks assessed Arousal/Alertness: Awake/alert Orientation Level: Oriented X4 Memory: Appears intact Awareness: Appears intact Problem Solving: Appears intact Safety/Judgment: Appears intact Comments: pt is fearful of falling and anxious Sensation Sensation Light Touch: Impaired Detail Peripheral sensation comments: decreased along incision on R residual limb Light Touch Impaired Details: Impaired RLE Proprioception: Appears Intact Coordination Gross Motor Movements are Fluid and Coordinated: No Fine Motor Movements are Fluid  and Coordinated: Yes Coordination and Movement Description: grossly uncoordinated due to R AKA, generalized weakness, anxiety, and fatigue Finger Nose Finger Test: [facility] Test: unable to perform on R LE due to AKA, WFL on L LE Motor  Motor Motor: Abnormal postural alignment and control Motor -  Skilled Clinical Observations: grossly uncoordinated due to R AKA, generalized weakness, anxiety, and fatigue. Wears L AFO due to foot drop  Mobility Bed Mobility Bed Mobility: Rolling Right;Rolling Left;Supine to Sit;Sit to Supine Rolling Right: Independent with assistive device Rolling Left: Independent with assistive device Supine to Sit: Independent with assistive device Sit to Supine: Independent with assistive device Transfers Transfers: Sit to Stand;Lateral/Scoot Transfers Sit to Stand: Moderate Assistance - Patient 50-74%(parallel bars) Lateral/Scoot Transfers: Contact Guard/Touching assist(slideboard) Transfer (Assistive device): Other (Comment)(slideboard) Locomotion  Gait Ambulation: No Gait Gait: No Stairs / Additional Locomotion Stairs: No Architect: Yes Wheelchair Assistance: Independent with Camera operator: Both upper extremities Wheelchair Parts Management: Needs assistance Distance: 185f  Trunk/Postural Assessment  Cervical Assessment Cervical Assessment: Within Functional Limits Thoracic Assessment Thoracic Assessment: Within Functional Limits Lumbar Assessment Lumbar Assessment: Exceptions to WFL(posterior pelvic tilt) Postural Control Postural Control: Deficits on evaluation  Balance Balance Balance Assessed: Yes Static Sitting Balance Static Sitting - Balance Support: Bilateral upper extremity supported Static Sitting - Level of Assistance: 7: Independent Dynamic Sitting Balance Dynamic Sitting - Balance Support: Bilateral upper extremity supported Dynamic Sitting - Level of Assistance: 7:  Independent Static Standing Balance Static Standing - Balance Support: Bilateral upper extremity supported(parallel bars) Static Standing - Level of Assistance: 4: Min assist Extremity Assessment  RLE Assessment RLE Assessment: Exceptions to [facility]trGeneral Strength Comments: grossly generalized to 4/5 (hip flexion, add, abd) LLE Assessment LLE Assessment: Exceptions to [facility] CountyGeneral Strength Comments: grossly generalized to 4/5 (except hip add 3+/5)  AAlfonse AlpersPT, DPT  07/18/2019, 7:44 AM

## 2019-07-19 ENCOUNTER — Inpatient Hospital Stay (HOSPITAL_COMMUNITY): Payer: Medicare Other

## 2019-07-19 DIAGNOSIS — R791 Abnormal coagulation profile: Secondary | ICD-10-CM | POA: Insufficient documentation

## 2019-07-19 DIAGNOSIS — R339 Retention of urine, unspecified: Secondary | ICD-10-CM | POA: Insufficient documentation

## 2019-07-19 DIAGNOSIS — D649 Anemia, unspecified: Secondary | ICD-10-CM

## 2019-07-19 DIAGNOSIS — J90 Pleural effusion, not elsewhere classified: Secondary | ICD-10-CM | POA: Insufficient documentation

## 2019-07-19 DIAGNOSIS — R5383 Other fatigue: Secondary | ICD-10-CM

## 2019-07-19 HISTORY — PX: IR THORACENTESIS ASP PLEURAL SPACE W/IMG GUIDE: IMG5380

## 2019-07-19 LAB — BPAM FFP
Blood Product Expiration Date: 202105012359
ISSUE DATE / TIME: 202104272138
Unit Type and Rh: 6200

## 2019-07-19 LAB — PROTIME-INR
INR: 2.6 — ABNORMAL HIGH (ref 0.8–1.2)
Prothrombin Time: 27 seconds — ABNORMAL HIGH (ref 11.4–15.2)

## 2019-07-19 LAB — BODY FLUID CELL COUNT WITH DIFFERENTIAL
Eos, Fluid: 0 %
Lymphs, Fluid: 41 %
Monocyte-Macrophage-Serous Fluid: 46 % — ABNORMAL LOW (ref 50–90)
Neutrophil Count, Fluid: 13 % (ref 0–25)
Total Nucleated Cell Count, Fluid: 89 cu mm (ref 0–1000)

## 2019-07-19 LAB — CBC WITH DIFFERENTIAL/PLATELET
Abs Immature Granulocytes: 0.03 10*3/uL (ref 0.00–0.07)
Basophils Absolute: 0.1 10*3/uL (ref 0.0–0.1)
Basophils Relative: 1 %
Eosinophils Absolute: 0.4 10*3/uL (ref 0.0–0.5)
Eosinophils Relative: 6 %
HCT: 26.5 % — ABNORMAL LOW (ref 36.0–46.0)
Hemoglobin: 8.2 g/dL — ABNORMAL LOW (ref 12.0–15.0)
Immature Granulocytes: 0 %
Lymphocytes Relative: 14 %
Lymphs Abs: 1 10*3/uL (ref 0.7–4.0)
MCH: 29.5 pg (ref 26.0–34.0)
MCHC: 30.9 g/dL (ref 30.0–36.0)
MCV: 95.3 fL (ref 80.0–100.0)
Monocytes Absolute: 0.9 10*3/uL (ref 0.1–1.0)
Monocytes Relative: 13 %
Neutro Abs: 4.6 10*3/uL (ref 1.7–7.7)
Neutrophils Relative %: 66 %
Platelets: 111 10*3/uL — ABNORMAL LOW (ref 150–400)
RBC: 2.78 MIL/uL — ABNORMAL LOW (ref 3.87–5.11)
RDW: 18.1 % — ABNORMAL HIGH (ref 11.5–15.5)
WBC: 7 10*3/uL (ref 4.0–10.5)
nRBC: 0 % (ref 0.0–0.2)

## 2019-07-19 LAB — URINE CULTURE

## 2019-07-19 LAB — TYPE AND SCREEN
ABO/RH(D): A POS
Antibody Screen: NEGATIVE
Unit division: 0

## 2019-07-19 LAB — BPAM RBC
Blood Product Expiration Date: 202105262359
ISSUE DATE / TIME: 202104272230
Unit Type and Rh: 6200

## 2019-07-19 LAB — PREPARE FRESH FROZEN PLASMA: Unit division: 0

## 2019-07-19 LAB — GLUCOSE, CAPILLARY
Glucose-Capillary: 132 mg/dL — ABNORMAL HIGH (ref 70–99)
Glucose-Capillary: 210 mg/dL — ABNORMAL HIGH (ref 70–99)
Glucose-Capillary: 188 mg/dL — ABNORMAL HIGH (ref 70–99)
Glucose-Capillary: 157 mg/dL — ABNORMAL HIGH (ref 70–99)

## 2019-07-19 LAB — GRAM STAIN

## 2019-07-19 LAB — PROTEIN, PLEURAL OR PERITONEAL FLUID: Total protein, fluid: <3 g/dL

## 2019-07-19 LAB — GLUCOSE, PLEURAL OR PERITONEAL FLUID: Glucose, Fluid: 190 mg/dL

## 2019-07-19 LAB — AMYLASE, PLEURAL OR PERITONEAL FLUID: Amylase, Fluid: 16 U/L

## 2019-07-19 LAB — ALBUMIN, PLEURAL OR PERITONEAL FLUID: Albumin, Fluid: <1 g/dL

## 2019-07-19 MED ORDER — PHYTONADIONE 5 MG PO TABS
5.0000 mg | ORAL_TABLET | Freq: Once | ORAL | Status: AC
  Administered 2019-07-19: 5 mg via ORAL
  Filled 2019-07-19: qty 1

## 2019-07-19 MED ORDER — LIDOCAINE HCL 1 % IJ SOLN
INTRAMUSCULAR | Status: AC
  Filled 2019-07-19: qty 20

## 2019-07-19 MED ORDER — LIDOCAINE HCL 1 % IJ SOLN
INTRAMUSCULAR | Status: DC | PRN
  Administered 2019-07-19: 10 mL

## 2019-07-19 MED ORDER — FUROSEMIDE 10 MG/ML IJ SOLN
20.0000 mg | Freq: Every day | INTRAMUSCULAR | Status: DC
  Administered 2019-07-19 – 2019-07-20 (×2): 20 mg via INTRAVENOUS
  Filled 2019-07-19 (×2): qty 2

## 2019-07-19 MED ORDER — MESALAMINE 1000 MG RE SUPP
1000.0000 mg | Freq: Every day | RECTAL | Status: DC
  Administered 2019-07-19 – 2019-07-27 (×9): 1000 mg via RECTAL
  Filled 2019-07-19 (×9): qty 1

## 2019-07-19 MED ORDER — CHLORHEXIDINE GLUCONATE CLOTH 2 % EX PADS
6.0000 | MEDICATED_PAD | Freq: Every day | CUTANEOUS | Status: DC
  Administered 2019-07-19 – 2019-07-22 (×4): 6 via TOPICAL

## 2019-07-19 MED ORDER — SPIRONOLACTONE 25 MG PO TABS
100.0000 mg | ORAL_TABLET | Freq: Every day | ORAL | Status: DC
  Administered 2019-07-19 – 2019-07-28 (×10): 100 mg via ORAL
  Filled 2019-07-19 (×10): qty 4

## 2019-07-19 MED ORDER — SODIUM CHLORIDE 0.9 % IR SOLN
3000.0000 mL | Status: DC
  Administered 2019-07-19: 3000 mL

## 2019-07-19 NOTE — Progress Notes (Signed)
PROGRESS NOTE    Brittany Russo  CLE:[id number] DOB: 03/28/1945 DOA: 07/04/2019 PCP: Rosalee Kaufman, PA-C   Brief Narrative:  Consult for coagulopathy and hematuria. Have consulted urology and order foley catheter to placed.  Assessment & Plan   Coagulopathy/Elevated INR -INR was as high as 5.4 -Suspect secondary to impaired hepatic function given that patient has had a history of liver cirrhosis -Patient was given FFP as well as vitamin K -INR has improved to 2.6 -Will continue vitamin K and monitor INR  Hematuria/UTI -Patient was had a history of endometrial cancer status post chemotherapy and radiation -Question if this is due to radiation cystitis -Reports having had frequent urinary tract infections which were treated with antibiotics -Overnight per nursing report, patient did have in and out cath and was noted to have passage of blood clots -Urology consulted and appreciated.  Discussed with Dr. Matilde Sprang, who recommended placing a 20 French Foley catheter and hand irrigation. -Ultrasound showed normal-appearing kidneys bilaterally.  Echogenic material within the bladder, which is partially distended-likely represents thrombus.  Mild ascites and large right pleural effusion.  Cirrhotic change of the liver noted.  Acute on chronic iron deficiency anemia -Transfused 1u PRBCs -hemoglobin up to 8.2  NASH with cirrhosis -Continue atenolol, lactulose, rifaximin -?why patient is not on diuretics -will place patient on IV lasix -May consider GI consultation  Right knee AKA -Continue therapy  Hyponatremia -possibly related to cirrhosis -continue BMP  Depression and anxiety -Continue lexapro amd xanax  Diabetes mellitus, type II -Continue metformin, Tradjenta, insulin sliding scale with CBG monitoring   DVT Prophylaxis currently with anemia, may consider SCDs  Code Status: Full  Family Communication: None at bedside  Disposition Plan: Per  primary.  Consultants [facility] Urology  Procedures  Right AKA Renal ultrasound  Antibiotics   Anti-infectives (From admission, onward)   Start     Dose/Rate Route Frequency Ordered Stop   07/12/19 1000  nitrofurantoin (macrocrystal-monohydrate) (MACROBID) capsule 100 mg     100 mg Oral Every 12 hours 07/12/19 0839 07/18/19 2211   07/04/19 2000  rifaximin (XIFAXAN) tablet 550 mg     550 mg Oral 2 times daily 07/04/19 1401        Subjective:   Brittany Russo seen and examined today.  Patient denies any further bleeding this morning.  Does not recall passing clots overnight.  Denies current chest pain or shortness of breath, abdominal pain, nausea or vomiting, dizziness or headache.  Objective:   Vitals:   07/19/19 0235 07/19/19 0443 07/19/19 0558 07/19/19 0941  BP: (!) 141/60 (!) 140/57 (!) 127/52 (!) 143/61  Pulse: 74 73 71 74  Resp: 16 17 16 20   Temp: 98.2 F (36.8 C) 97.9 F (36.6 C) 98.8 F (37.1 C) (!) 97.5 F (36.4 C)  TempSrc: Oral Oral Oral Oral  SpO2: 97% 99% 98% 95%  Weight:      Height:        Intake/Output Summary (Last 24 hours) at 07/19/2019 1003 Last data filed at 07/19/2019 0450 Gross per 24 hour  Intake 1812 ml  Output 350 ml  Net 1462 ml   Filed Weights   07/05/19 0700  Weight: 73.6 kg    Exam  General: Well developed, chronically ill-appearing, NAD  HEENT: NCAT, mucous membranes moist.   Cardiovascular: S1 S2 auscultated, RRR  Respiratory: Clear to auscultation bilaterally  Abdomen: Soft, nontender, +distended, + bowel sounds  Extremities: Right AKA.  Left lower extremity 2+ edema.  Neuro: AAOx3, nonfocal  Psych: Appropriate mood and affect   Data Reviewed: I have personally reviewed following labs and imaging studies  CBC: Recent Labs  Lab 07/14/19 0800 07/14/19 0800 07/15/19 0855 07/17/19 0609 07/18/19 0606 07/18/19 1803 07/19/19 0709  WBC 3.5*   < > 5.3 6.7 8.5 7.6 7.0  NEUTROABS 2.4  --  3.7  --  5.1  --  4.6  HGB 8.1*    < > 7.8* 7.9* 7.7* 6.8* 8.2*  HCT 26.6*   < > 25.2* 25.4* 25.8* 22.3* 26.5*  MCV 95.3   < > 93.7 97.7 98.9 95.3 95.3  PLT 76*   < > 99* 129* 134* 123* 111*   < > = values in this interval not displayed.   Basic Metabolic Panel: Recent Labs  Lab 07/17/19 0609 07/18/19 1442  NA 130* 131*  K 4.4 4.6  CL 95* 98  CO2 26 21*  GLUCOSE 147* 136*  BUN 13 19  CREATININE 0.87 0.99  CALCIUM 8.2* 8.4*   GFR: Estimated Creatinine Clearance: 52.3 mL/min (by C-G formula based on SCr of 0.99 mg/dL). Liver Function Tests: Recent Labs  Lab 07/18/19 1442  AST 61*  ALT 53*  ALKPHOS 127*  BILITOT 1.9*  PROT 4.9*  ALBUMIN 2.1*   No results for input(s): LIPASE, AMYLASE in the last 168 hours. Recent Labs  Lab 07/18/19 1423  AMMONIA 34   Coagulation Profile: Recent Labs  Lab 07/18/19 1442 07/18/19 1803 07/19/19 0709  INR 4.9* 5.4* 2.6*   Cardiac Enzymes: No results for input(s): CKTOTAL, CKMB, CKMBINDEX, TROPONINI in the last 168 hours. BNP (last 3 results) No results for input(s): PROBNP in the last 8760 hours. HbA1C: No results for input(s): HGBA1C in the last 72 hours. CBG: Recent Labs  Lab 07/18/19 0630 07/18/19 1148 07/18/19 1711 07/18/19 2147 07/19/19 0602  GLUCAP 135* 163* 133* 191* 132*   Lipid Profile: No results for input(s): CHOL, HDL, LDLCALC, TRIG, CHOLHDL, LDLDIRECT in the last 72 hours. Thyroid Function Tests: No results for input(s): TSH, T4TOTAL, FREET4, T3FREE, THYROIDAB in the last 72 hours. Anemia Panel: No results for input(s): VITAMINB12, FOLATE, FERRITIN, TIBC, IRON, RETICCTPCT in the last 72 hours. Urine analysis:    Component Value Date/Time   COLORURINE RED (A) 07/18/2019 1915   APPEARANCEUR TURBID (A) 07/18/2019 1915   LABSPEC  07/18/2019 1915    TEST NOT REPORTED DUE TO COLOR INTERFERENCE OF URINE PIGMENT   PHURINE  07/18/2019 1915    TEST NOT REPORTED DUE TO COLOR INTERFERENCE OF URINE PIGMENT   GLUCOSEU (A) 07/18/2019 1915    TEST  NOT REPORTED DUE TO COLOR INTERFERENCE OF URINE PIGMENT   HGBUR (A) 07/18/2019 1915    TEST NOT REPORTED DUE TO COLOR INTERFERENCE OF URINE PIGMENT   BILIRUBINUR (A) 07/18/2019 1915    TEST NOT REPORTED DUE TO COLOR INTERFERENCE OF URINE PIGMENT   BILIRUBINUR NEGATIVE 03/06/2013 1100   KETONESUR (A) 07/18/2019 1915    TEST NOT REPORTED DUE TO COLOR INTERFERENCE OF URINE PIGMENT   PROTEINUR (A) 07/18/2019 1915    TEST NOT REPORTED DUE TO COLOR INTERFERENCE OF URINE PIGMENT   UROBILINOGEN negative 03/06/2013 1100   NITRITE (A) 07/18/2019 1915    TEST NOT REPORTED DUE TO COLOR INTERFERENCE OF URINE PIGMENT   LEUKOCYTESUR (A) 07/18/2019 1915    TEST NOT REPORTED DUE TO COLOR INTERFERENCE OF URINE PIGMENT   Sepsis Labs: @LABRCNTIP (procalcitonin:4,lacticidven:4)  ) Recent Results (from the past 240 hour(s))  Culture, Urine     Status:  Abnormal   Collection Time: 07/11/19  2:11 PM   Specimen: Urine, Random  Result Value Ref Range Status   Specimen Description URINE, RANDOM  Final   Special Requests   Final    NONE Performed at [facility], [address], [place], [place] [address]    Culture >=100,000 COLONIES/mL CITROBACTER FREUNDII (A)  Final   Report Status 07/14/2019 FINAL  Final   Organism ID, Bacteria CITROBACTER FREUNDII (A)  Final      Susceptibility   Citrobacter freundii - MIC*    CEFAZOLIN >=64 RESISTANT Resistant     CEFTRIAXONE <=0.25 SENSITIVE Sensitive     CIPROFLOXACIN <=0.25 SENSITIVE Sensitive     GENTAMICIN <=1 SENSITIVE Sensitive     IMIPENEM 1 SENSITIVE Sensitive     NITROFURANTOIN <=16 SENSITIVE Sensitive     TRIMETH/SULFA <=20 SENSITIVE Sensitive     PIP/TAZO <=4 SENSITIVE Sensitive     * >=100,000 COLONIES/mL CITROBACTER FREUNDII      Radiology Studies: DG Chest 2 View  Result Date: 07/18/2019 CLINICAL DATA:  Lethargy. Dizziness and nausea. EXAM: CHEST - 2 VIEW COMPARISON:  Chest CT 03/28/2017 FINDINGS: Moderate right pleural effusion with  fluid tracking laterally and in the fissure. Mild cardiomegaly. Aortic atherosclerosis and tortuosity. Vascular congestion without pulmonary edema. No left pleural effusion. No pneumothorax. No acute osseous abnormalities are seen. IMPRESSION: 1. Moderate right pleural effusion with fluid tracking laterally and in the fissure. 2. Mild cardiomegaly with vascular congestion. Electronically Signed   By: Keith Rake M.D.   On: 07/18/2019 18:44   US RENAL  Result Date: 07/18/2019 CLINICAL DATA:  Hematuria EXAM: RENAL / URINARY TRACT ULTRASOUND COMPLETE COMPARISON:  Chest x-ray from earlier in the same day. FINDINGS: Right Kidney: Renal measurements: 11.3 x 4.6 x 5.3 cm. = volume: 145 mL . Echogenicity within normal limits. No mass or hydronephrosis visualized. Left Kidney: Renal measurements: 10.1 x 5.9 x 5.2 cm. = volume: 161 mL. Echogenicity within normal limits. No mass or hydronephrosis visualized. Bladder: Bladder is decompressed. Echogenic material is noted within which may be related to thrombus or focal mass. Given the history of hematuria this likely represents thrombus. Other: Mild ascites is noted. Large right-sided pleural effusion is seen. Cirrhotic changes of liver are noted. IMPRESSION: Normal appearing kidneys bilaterally. Echogenic material within the bladder which is partially distended. Given the patient's history of hematuria this likely represents thrombus. Direct visualization may be helpful. Mild ascites and large right pleural effusion. Cirrhotic change of the liver is noted. Electronically Signed   By: Inez Catalina M.D.   On: 07/18/2019 21:24     Scheduled Meds: . ALPRAZolam  0.25 mg Oral BID  . atenolol  50 mg Oral Daily  . atorvastatin  80 mg Oral Q2000  . buPROPion  150 mg Oral Daily  . docusate sodium  100 mg Oral BID  . escitalopram  20 mg Oral Daily  . ferrous sulfate  325 mg Oral BID WC  . fluticasone  1 spray Each Nare Daily  . furosemide  20 mg Intravenous Daily  .  gabapentin  300 mg Oral BID  . Gerhardt's butt cream   Topical QID  . insulin aspart  0-15 Units Subcutaneous TID WC  . insulin aspart  0-5 Units Subcutaneous QHS  . lactulose  30 g Oral BID  . linagliptin  5 mg Oral Daily  . loratadine  10 mg Oral Daily  . metFORMIN  500 mg Oral BID WC  . phytonadione  5 mg Oral Once  . rifaximin  550 mg Oral BID   Continuous Infusions: . sodium chloride irrigation       LOS: 15 days   Time Spent in minutes   45 minutes  Hilton Saephan D.O. on 07/19/2019 at 10:03 AM  Between 7am to 7pm - Please see pager noted on [email or web address]  After 7pm go to [email or web address]  And look for the night coverage person covering for me after hours  [facility] Office  [phone]

## 2019-07-19 NOTE — Progress Notes (Signed)
Blood transfusing, pt is resting no denies any s/sx of allergic reaction. VSS call light within reach.

## 2019-07-19 NOTE — Progress Notes (Addendum)
[facility] PHYSICAL MEDICINE & REHABILITATION PROGRESS NOTE  Subjective/Complaints: Patient seen sitting up in bed this morning.  She states she slept well overnight, but is still tired this morning.  Patient noted to be more lethargic, with increased hematuria, and proximal decline with therapies.  Labs ordered, showing critically elevated INR.  Further work-up showed critical.  Unable to discuss with hematology/oncology, internal medicine consulted, appreciate recs, notes reviewed-renal ultrasound ordered.  Patient was transfused FFP, given vitamin K, given blood. This AM, she was also noted to have urinary retention.   ROS: Denies CP, SOB, N/V/D  Objective: Vital Signs: Blood pressure (!) 127/52, pulse 71, temperature 98.8 F (37.1 C), temperature source Oral, resp. rate 16, height 5' 5"  (1.651 m), weight 73.6 kg, SpO2 98 %. DG Chest 2 View  Result Date: 07/18/2019 CLINICAL DATA:  Lethargy. Dizziness and nausea. EXAM: CHEST - 2 VIEW COMPARISON:  Chest CT 03/28/2017 FINDINGS: Moderate right pleural effusion with fluid tracking laterally and in the fissure. Mild cardiomegaly. Aortic atherosclerosis and tortuosity. Vascular congestion without pulmonary edema. No left pleural effusion. No pneumothorax. No acute osseous abnormalities are seen. IMPRESSION: 1. Moderate right pleural effusion with fluid tracking laterally and in the fissure. 2. Mild cardiomegaly with vascular congestion. Electronically Signed   By: Keith Rake M.D.   On: 07/18/2019 18:44   US RENAL  Result Date: 07/18/2019 CLINICAL DATA:  Hematuria EXAM: RENAL / URINARY TRACT ULTRASOUND COMPLETE COMPARISON:  Chest x-ray from earlier in the same day. FINDINGS: Right Kidney: Renal measurements: 11.3 x 4.6 x 5.3 cm. = volume: 145 mL . Echogenicity within normal limits. No mass or hydronephrosis visualized. Left Kidney: Renal measurements: 10.1 x 5.9 x 5.2 cm. = volume: 161 mL. Echogenicity within normal limits. No mass or  hydronephrosis visualized. Bladder: Bladder is decompressed. Echogenic material is noted within which may be related to thrombus or focal mass. Given the history of hematuria this likely represents thrombus. Other: Mild ascites is noted. Large right-sided pleural effusion is seen. Cirrhotic changes of liver are noted. IMPRESSION: Normal appearing kidneys bilaterally. Echogenic material within the bladder which is partially distended. Given the patient's history of hematuria this likely represents thrombus. Direct visualization may be helpful. Mild ascites and large right pleural effusion. Cirrhotic change of the liver is noted. Electronically Signed   By: Inez Catalina M.D.   On: 07/18/2019 21:24   Recent Labs    07/18/19 1803 07/19/19 0709  WBC 7.6 7.0  HGB 6.8* 8.2*  HCT 22.3* 26.5*  PLT 123* 111*   Recent Labs    07/17/19 0609 07/18/19 1442  NA 130* 131*  K 4.4 4.6  CL 95* 98  CO2 26 21*  GLUCOSE 147* 136*  BUN 13 19  CREATININE 0.87 0.99  CALCIUM 8.2* 8.4*    Physical Exam: BP (!) 127/52 (BP Location: Right Arm)   Pulse 71   Temp 98.8 F (37.1 C) (Oral)   Resp 16   Ht 5' 5"  (1.651 m)   Wt 73.6 kg   SpO2 98%   BMI 27.00 kg/m  Constitutional: No distress . Vital signs reviewed. HENT: Normocephalic.  Atraumatic. Eyes: EOMI. No discharge. Cardiovascular: No JVD. Respiratory: Normal effort.  No stridor. GI: Non-distended. Skin: Right AKA with dressing C/C/I Psych: Flat. Musc: Diffuse edema and tenderness Neuro: Alert Motor:  Left lower extremity: Hip flexion, knee extension 4/5, ankle dorsiflexion tight heelcord, but able to dorsiflex, 4-/5 ADF Right lower extremity: Hip flexion 4/5 (some pain inhibition), improving  Assessment/Plan: 1.  Functional deficits secondary to right AKA which require 3+ hours per day of interdisciplinary therapy in a comprehensive inpatient rehab setting.  Physiatrist is providing close team supervision and 24 hour management of active  medical problems listed below.  Physiatrist and rehab team continue to assess barriers to discharge/monitor patient progress toward functional and medical goals  Care Tool:  Bathing    Body parts bathed by patient: Right arm, Left arm, Chest, Abdomen, Front perineal area, Right upper leg, Left upper leg, Face, Buttocks   Body parts bathed by helper: Buttocks, Left upper leg Body parts n/a: Right lower leg   Bathing assist Assist Level: Minimal Assistance - Patient > 75%     Upper Body Dressing/Undressing Upper body dressing   What is the patient wearing?: Pull over shirt    Upper body assist Assist Level: Set up assist    Lower Body Dressing/Undressing Lower body dressing      What is the patient wearing?: Pants     Lower body assist Assist for lower body dressing: Moderate Assistance - Patient 50 - 74%     Toileting Toileting    Toileting assist Assist for toileting: Moderate Assistance - Patient 50 - 74% Assistive Device Comment: BSC in bathroom   Transfers Chair/bed transfer  Transfers assist  Chair/bed transfer activity did not occur: Safety/medical concerns  Chair/bed transfer assist level: Contact Guard/Touching assist(slideboard)     Locomotion Ambulation   Ambulation assist   Ambulation activity did not occur: Safety/medical concerns(R AKA, L LE and generalized weakness, decreased balance/postural control, OA, and osteoperosis)          Walk 10 feet activity   Assist  Walk 10 feet activity did not occur: Safety/medical concerns(R AKA, L LE and generalized weakness, decreased balance/postural control, OA, and osteoperosis)        Walk 50 feet activity   Assist Walk 50 feet with 2 turns activity did not occur: Safety/medical concerns(R AKA, L LE and generalized weakness, decreased balance/postural control, OA, and osteoperosis)         Walk 150 feet activity   Assist Walk 150 feet activity did not occur: Safety/medical concerns(R  AKA, L LE and generalized weakness, decreased balance/postural control, OA, and osteoperosis)         Walk 10 feet on uneven surface  activity   Assist Walk 10 feet on uneven surfaces activity did not occur: Safety/medical concerns(R AKA, L LE and generalized weakness, decreased balance/postural control, OA, and osteoperosis)         Wheelchair     Assist Will patient use wheelchair at discharge?: Yes Type of Wheelchair: Manual    Wheelchair assist level: Independent Max wheelchair distance: 137f    Wheelchair 50 feet with 2 turns activity    Assist        Assist Level: Independent   Wheelchair 150 feet activity     Assist     Assist Level: Independent      Medical Problem List and Plan: 1.  Impaired function, ADLs and mobility secondary to R AKA due to failed TKRs  Continue CIR, patient was scheduled to be discharged today, however given critical lab values, functional decline, AMS will hold discharge  Team conference today to discuss current and goals and coordination of care, home and environmental barriers, and discharge planning with nursing, case manager, and therapies.  2.  Antithrombotics: -DVT/anticoagulation:  Cannot do lovenox due to low platelets             -  antiplatelet therapy: N/A 3. Pain Management: Tramadol 50 mg qid with robaxin/Hydrocodone prn  Phantom limb pain    Gabapentin 100 BID started on 4/13, changed to 300 mg nightly on 4/15, changed to twice daily on 4/19, tolerating  Controlled with meds on 4/28 4. Mood: LCSW to follow for evaluation and support.              -antipsychotic agents: N/A 5. Neuropsych: This patient is ?fully capable of making decisions on her own behalf.  Discussed admission with neuropsych, appreciate evaluation and recs 6. Skin/Wound Care: Monitor wound for healing once surgical dressing removed.   ABD pad, with Kerlix and Ace dressings to AKA. 7. Fluids/Electrolytes/Nutrition: Monitor I/Os 8.  Cirrhosis of the liver: Continue Xifaxan.  Hyperammonemia -improving   Lactulose started on 4/14, stopped due to loose stools on 4/18  LFTs elevated on 4/28 9. ABLA:   Hemoglobin 8.2 on 4/28 after transfusion, labs ordered for tomorrow.  Continue to monitor 10. Chronic Thrombocytopenia: Monitor for signs of bleeding. Baseline at 74-->51.   Platelets 111 on 4/28, labs ordered for tomorrow  Continue to monitor 11. Chronic hyponatremia: Continue to monitor.   Sodium 131 on 4/27  Continue to monitor 12. T2DM with hyperglycemia: Hgb A1c-5.2.   Glucotrol 2.5 twice daily, discussed with pharmacy DC on 4/23  Continue Tradjenta 5 mg daily  Continue metformin   Lantus 20 units daily, decreased to 15 units on 4/18, discussed with pharmacy, [place] on 4/23  Slightly labile on 4/28 CBG (last 3)  Recent Labs    07/18/19 1711 07/18/19 2147 07/19/19 0602  GLUCAP 133* 191* 132*  13. H/o depression: ON Lexapro, Xanax and Wellbutrin. 14.  Labile blood pressure  Relatively controlled on 4/28 15.  Radiation cystitis-   Discussed with Urology - seen as outpatient for hematuria.  Noted to have clots, discussed with urology, continue to encourage fluids  Will consult Urology again for bladder irrigation given increase in clots and recent Vit K/FFP, and renal U/S suggesting thrombus  Now with urinary retention 16. Acute lower UTI  UA +, Ucx >100K Citrobacter  Continue Macrobid through 4/28  Repeat Ucx pending 17. Supratherapeutic INR  INR 2.6 on 4/28, labs ordered for tomorrow  After Vit K and FFP on 4/28 18. Pleural effusion  Seen on CXR  Will consult VIR for further recs   LOS: 15 days A FACE TO FACE EVALUATION WAS PERFORMED  Brittany Russo Brittany Russo 07/19/2019, 8:38 AM

## 2019-07-19 NOTE — Progress Notes (Signed)
Pt on continuous bladder irrigation w/ 3000 ml normal saline bags. Unable to order extra bags through [facility] portal. Called materials and was told there are no available bags on campus. Called on-call provider and received orders to page Urology. Urology gave order to use 1000 ml normal saline bags for now.

## 2019-07-19 NOTE — Progress Notes (Signed)
Pt's son updated on treatment and pt status

## 2019-07-19 NOTE — Progress Notes (Signed)
Patient cathed @04 :50 due to no void > 6hrs and bladder scan > 350. Nurse tech reported bloody urine and clots, only able to get 259m of urine from catheterization. DLinna Hoff PA made aware.

## 2019-07-19 NOTE — Patient Care Conference (Signed)
Inpatient RehabilitationTeam Conference and Plan of Care Update Date: 07/19/2019   Time: 11:55 AM   Patient Name: Gratton Record Number: [id number]  Date of Birth: 1945/10/26 Sex: Female         Room/Bed: 4M13C/4M13C-01 Payor Info: Payor: [employer] / Plan: Electrical engineer / Product Type: *No Product type* /    Admit Date/Time:  07/04/2019  1:58 PM  Primary Diagnosis:  Amputation above knee [facility])  Patient Active Problem List   Diagnosis Date Noted  . Lethargy   . Acute on chronic anemia   . Pleural effusion   . Supratherapeutic INR   . Urinary retention   . Acute lower UTI   . Radiation cystitis   . Labile blood pressure   . Labile blood glucose   . Anxiety state   . Controlled type 2 diabetes mellitus with hyperglycemia, with long-term current use of insulin ([place])   . Hyponatremia   . Thrombocytopenia ([place])   . Phantom limb pain ([place])   . Postoperative pain   . Amputation above knee ([place]) 07/04/2019  . S/P right knee disarticulation amputation 06/29/2019  . Above knee amputation of right lower extremity ([place]) 06/29/2019  . Hyperglycemia   . Failed total right knee replacement ([place])   . Hepatic encephalopathy ([place]) 12/28/2018  . UTI (urinary tract infection) 12/28/2018  . Bradycardia 12/28/2018  . Hyperkalemia 12/28/2018  . Hematuria 11/23/2018  . Overweight (BMI 25.0-29.9) 11/23/2018  . Mechanical problem with extremity 11/22/2018  . Cirrhosis of liver with ascites ([place]) 11/09/2018  . Nausea without vomiting 10/26/2018  . Ascites   . Rectal bleeding   . AKI (acute kidney injury) ([place]) 10/11/2018  . Lower GI bleed 10/10/2018  . Anasarca 10/10/2018  . Hypoalbuminemia 10/10/2018  . Depression 09/28/2018  . Acute hepatic encephalopathy 09/28/2018  . Cirrhosis ([place]) 09/28/2018  . Periprosthetic fracture around internal prosthetic right knee joint 09/24/2018  . S/P right TKA 09/22/2018  . Status post total knee replacement, right 09/22/2018  . DM2  (diabetes mellitus, type 2) ([place]) 07/11/2015  . Hypertension 07/11/2015  . Hyperlipidemia 07/11/2015    Expected Discharge Date: Expected Discharge Date: 07/21/19  Team Members Present: Physician leading conference: Dr. Delice Lesch Care Coodinator Present: Nestor Lewandowsky, RN, BSN, CRRN;Christina Sampson Goon, BSW;Genie Leilany Digeronimo, RN, MSN Nurse Present: Ellison Carwin, LPN PT Present: Becky Sax, PT OT Present: Mariane Masters, OT SLP Present: Weston Anna, SLP PPS Coordinator present : Ileana Ladd, Burna Mortimer, SLP     Current Status/Progress Goal Weekly Team Focus  Bowel/Bladder   continent of bowel and bladder; lbm 4/27 per patient  To remain continent  assess q shift/prn   Swallow/Nutrition/ Hydration             ADL's   CGA/min A, at goal level         Mobility   CGA/min A, at goal level         Communication             Safety/Cognition/ Behavioral Observations            Pain   complaints of phantom pain in R stump  pain < 5  assess q shift/prn   Skin   R AKA (staples) daily dressing change  prevent further infectoin/breakdown  assess skin q shift/prn    Rehab Goals Patient on target to meet rehab goals: Yes *See Care Plan and progress notes for long and short-term goals.     Barriers  to Discharge  Current Status/Progress Possible Resolutions Date Resolved   Nursing                  PT                    OT                  SLP                SW Wound Care;Medical stability Discharge for 07/19/19 put on hold due to medical issues Has hospital be, over bed tray, ramped entrance,slideboard, w/c, walker, BSC, shower bench and CNA 3 hrs/day 5x/week          Discharge Planning/Teaching Needs:  Home with spouse and daughters and CNA 10-1p 5x/week  Wound care, transfers, toileting, medications, etc   Team Discussion: MD yesterday with hematuria, INR elevated, Hgb down, given blood transfusion, CXR showed pleural effusion, urology contacted,  needs CBI, medicine consulted and following, ordered IV lasix, stump with drainage.  RN BM today, CBI is running, lasix given, bottom is red, stump drsg changed.  OT/PT met goals, DC from therapy, at CGA/min A level, fam ed completed.  DC scheduled for today cancelled due to medical issues.   Revisions to Treatment Plan: N/A     Medical Summary Current Status: Impaired function, ADLs and mobility secondary to R AKA due to failed TKRs Weekly Focus/Goal: Improve mobility, phantom limb pain, cognition, BP, UTI, pleural effusion, elevated INR, hematuria  Barriers to Discharge: Medical stability;Weight;Wound care;Weight bearing restrictions  Barriers to Discharge Comments: See above Possible Resolutions to Barriers: Therapies, abx for UTI, CBI for hematuria, optimize DM meds, follow labs, GI consult, Urology recs   Continued Need for Acute Rehabilitation Level of Care: The patient requires daily medical management by a physician with specialized training in physical medicine and rehabilitation for the following reasons: Direction of a multidisciplinary physical rehabilitation program to maximize functional independence : Yes Medical management of patient stability for increased activity during participation in an intensive rehabilitation regime.: Yes Analysis of laboratory values and/or radiology reports with any subsequent need for medication adjustment and/or medical intervention. : Yes   I attest that I was present, lead the team conference, and concur with the assessment and plan of the team.   Retta Diones 07/19/2019, 10:58 PM   Team conference was held via web/ teleconference due to [place] - 19

## 2019-07-19 NOTE — Consult Note (Addendum)
Urology Consult   Physician requesting consult: Cristal Ford  Reason for consult: Gross hematuria  History of Present Illness: Brittany Russo is a 74 y.o. female with PMH significant for cirrhosis, anxiety, ascites, DM II, endometrial cancer s/p hysterectomy and radiation, hematochezia, and thrombocytopenia who is currently in rehab s/p right AKA on 06/29/19. She has known radiation cystitis and hx of gross hematuria and UTIs. She developed intermittent gross hematuria several weeks ago that progressed yesterday to become consistent with each void and include clots.  She then had some difficulty voiding and several I/O caths were placed until there was no return except for some clots.  RUS 07/18/19 showed normal kidneys with some echogenic material in the bladder.  Urine culture on 07/11/19 revealed Citrobacter Freundii with multiple sensitivities.  It was treated with 7 days of Macrobid which she completed yesterday.  Repeat urine culture was sent earlier today.   She also developed an elevated INR with worsening anemia and thrombocytopenia over the last two days. She was treated with FFP, Vitamin K, and transfused.   She is resting comfortably but seems nervous. She denies abdominal pain.   Past Medical History:  Diagnosis Date   Acute and subacute hepatic failure without coma    Allergy    Anasarca    Anxiety    Arthritis    Ascites 09/2018   no SBP on 850 cc tap 10/13/18   Asthma    allery induced   Cirrhosis of liver ([place]) ~ 2004   from NAFLD.  Dx ~ 2004.  Dr Dorcas Mcmurray at [facility]/     Depression    Diabetes mellitus without complication [facility])    type 2   Eczema of both hands    Endometrial cancer ([place]) 1996, 2003   hysterectomy 1996.  recurrence 2003, treated with radiation.     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, DR Allyson Sabal Mir at [place] in [place]   Hyperlipidemia    Hypertension    Muscle weakness     Seizures ([place])    last seizure June 07, 2014    Thrombocytopenia [facility])    Thyroid nodule     Past Surgical History:  Procedure Laterality Date   AMPUTATION Right 06/29/2019   Procedure: AMPUTATION ABOVE KNEE through knee;  Surgeon: Paralee Cancel, MD;  Location: [facility];  Service: Orthopedics;  Laterality: Right;  2 hrs   CATARACT EXTRACTION W/PHACO Left 06/04/2017   Procedure: CATARACT EXTRACTION PHACO AND INTRAOCULAR LENS PLACEMENT (IOC);  Surgeon: Baruch Goldmann, MD;  Location: [facility];  Service: Ophthalmology;  Laterality: Left;  CDE: 3.90   CATARACT EXTRACTION W/PHACO Right 07/15/2017   Procedure: CATARACT EXTRACTION PHACO AND INTRAOCULAR LENS PLACEMENT (IOC);  Surgeon: Baruch Goldmann, MD;  Location: [facility];  Service: Ophthalmology;  Laterality: Right;  CDE: 7.60   COLONOSCOPY  06/2007   in [place]. for hematochzia.  radiation proctitis   DILATION AND CURETTAGE OF UTERUS     ESOPHAGOGASTRODUODENOSCOPY  2016   ESOPHAGOGASTRODUODENOSCOPY (EGD) WITH PROPOFOL N/A 12/30/2018   Procedure: EGD;  Surgeon: Clarene Essex, MD;  Location: [facility];  Service: Endoscopy;  Laterality: N/A;   EXCISIONAL TOTAL KNEE ARTHROPLASTY Right 01/03/2019   Procedure: EXCISIONAL TOTAL KNEE ARTHROPLASTY;  Surgeon: Paralee Cancel, MD;  [facility];  Service: Orthopedics;  Laterality: Right;   FLEXIBLE SIGMOIDOSCOPY N/A 12/30/2018   Procedure: FLEXIBLE SIGMOIDOSCOPY;  Surgeon: Clarene Essex, MD;  [facility];  Service: Endoscopy;  Laterality: N/A;   IR PARACENTESIS  10/13/2018   STERIOD INJECTION Left 06/29/2019   Procedure: LEFT KNEE INJECTION;  Surgeon: Paralee Cancel, MD;  [facility];  Service: Orthopedics;  Laterality: Left;   TOTAL ABDOMINAL HYSTERECTOMY  1996   TOTAL KNEE ARTHROPLASTY Right 09/22/2018   Procedure: TOTAL KNEE ARTHROPLASTY;  Surgeon: Paralee Cancel, MD;  [facility];  Service: Orthopedics;  Laterality: Right;  70 mins   TOTAL KNEE REVISION Right 09/27/2018   Procedure: TOTAL KNEE  REVISION;  Surgeon: Paralee Cancel, MD;  [facility];  Service: Orthopedics;  Laterality: Right;   TOTAL KNEE REVISION Right 11/22/2018   Procedure: repair right knee extensor mechanism;  Surgeon: Paralee Cancel, MD;  [facility];  Service: Orthopedics;  Laterality: Right;  90 mins     Current Hospital Medications:  Home meds:  . No outpatient medications have been marked as taking for the 07/04/19 encounter [facility]counter).    Scheduled Meds:  ALPRAZolam  0.25 mg Oral BID   atenolol  50 mg Oral Daily   atorvastatin  80 mg Oral Q2000   buPROPion  150 mg Oral Daily   docusate sodium  100 mg Oral BID   escitalopram  20 mg Oral Daily   ferrous sulfate  325 mg Oral BID WC   fluticasone  1 spray Each Nare Daily   furosemide  20 mg Intravenous Daily   gabapentin  300 mg Oral BID   Gerhardt's butt cream   Topical QID   insulin aspart  0-15 Units Subcutaneous TID WC   insulin aspart  0-5 Units Subcutaneous QHS   lactulose  30 g Oral BID   linagliptin  5 mg Oral Daily   loratadine  10 mg Oral Daily   metFORMIN  500 mg Oral BID WC   rifaximin  550 mg Oral BID   Continuous Infusions:  sodium chloride irrigation     PRN Meds:.acetaminophen, albuterol, alum & mag hydroxide-simeth, bisacodyl, diphenhydrAMINE, guaiFENesin-dextromethorphan, HYDROcodone-acetaminophen, lip balm, magnesium citrate, methocarbamol, polyethylene glycol, prochlorperazine **OR** prochlorperazine **OR** prochlorperazine, sodium phosphate, traMADol, traZODone  Allergies:  Allergies  Allergen Reactions   Asa [Aspirin] Other (See Comments)    Pt not allergic but cannot take due to bleeding    Other Other (See Comments)    Any jewelry metal-hives and itching     Family History  Problem Relation Age of Onset   Heart disease Mother    Diabetes Mother    Heart disease Father    Cancer Father    Cancer Maternal Grandmother    Heart disease Maternal Grandfather     Social History:  reports that she  has never smoked. She has never used smokeless tobacco. She reports that she does not drink alcohol or use drugs.  ROS: A complete review of systems was performed.  All systems are negative except for pertinent findings as noted.  Physical Exam:  Vital signs in last 24 hours: Temp:  [97.5 F (36.4 C)-98.8 F (37.1 C)] 97.5 F (36.4 C) (04/28 0941) Pulse Rate:  [71-74] 74 (04/28 0941) Resp:  [16-20] 20 (04/28 0941) BP: (93-158)/(52-61) 143/61 (04/28 0941) SpO2:  [95 %-100 %] 95 % (04/28 0941) Constitutional:  Alert and oriented, No acute distress Cardiovascular: Regular rate and rhythm Respiratory: Normal respiratory effort GI: Abdomen is soft, nontender, nondistended GU: clean and dry; no evidence of rash or skin breakdown Lymphatic: No lymphadenopathy Neurologic: Grossly intact, no focal deficits Psychiatric: Normal mood and affect  Laboratory  Data:  Recent Labs    07/17/19 0609 07/18/19 0606 07/18/19 1803 07/19/19 0709  WBC 6.7 8.5 7.6 7.0  HGB 7.9* 7.7* 6.8* 8.2*  HCT 25.4* 25.8* 22.3* 26.5*  PLT 129* 134* 123* 111*    Recent Labs    07/17/19 0609 07/18/19 1442  NA 130* 131*  K 4.4 4.6  CL 95* 98  GLUCOSE 147* 136*  BUN 13 19  CALCIUM 8.2* 8.4*  CREATININE 0.87 0.99     Results for orders placed or performed during the hospital encounter of 07/04/19 (from the past 24 hour(s))  Ammonia     Status: None   Collection Time: 07/18/19  2:23 PM  Result Value Ref Range   Ammonia 34 9 - 35 umol/L  Comprehensive metabolic panel     Status: Abnormal   Collection Time: 07/18/19  2:42 PM  Result Value Ref Range   Sodium 131 (L) 135 - 145 mmol/L   Potassium 4.6 3.5 - 5.1 mmol/L   Chloride 98 98 - 111 mmol/L   CO2 21 (L) 22 - 32 mmol/L   Glucose, Bld 136 (H) 70 - 99 mg/dL   BUN 19 8 - 23 mg/dL   Creatinine, Ser 0.99 0.44 - 1.00 mg/dL   Calcium 8.4 (L) 8.9 - 10.3 mg/dL   Total Protein 4.9 (L) 6.5 - 8.1 g/dL   Albumin 2.1 (L) 3.5 - 5.0 g/dL   AST 61 (H) 15 - 41  U/L   ALT 53 (H) 0 - 44 U/L   Alkaline Phosphatase 127 (H) 38 - 126 U/L   Total Bilirubin 1.9 (H) 0.3 - 1.2 mg/dL   GFR calc non Af Amer 57 (L) >60 mL/min   GFR calc Af Amer >60 >60 mL/min   Anion gap 12 5 - 15  Protime-INR     Status: Abnormal   Collection Time: 07/18/19  2:42 PM  Result Value Ref Range   Prothrombin Time 44.1 (H) 11.4 - 15.2 seconds   INR 4.9 (HH) 0.8 - 1.2  Glucose, capillary     Status: Abnormal   Collection Time: 07/18/19  5:11 PM  Result Value Ref Range   Glucose-Capillary 133 (H) 70 - 99 mg/dL  CBC     Status: Abnormal   Collection Time: 07/18/19  6:03 PM  Result Value Ref Range   WBC 7.6 4.0 - 10.5 K/uL   RBC 2.34 (L) 3.87 - 5.11 MIL/uL   Hemoglobin 6.8 (LL) 12.0 - 15.0 g/dL   HCT 22.3 (L) 36.0 - 46.0 %   MCV 95.3 80.0 - 100.0 fL   MCH 29.1 26.0 - 34.0 pg   MCHC 30.5 30.0 - 36.0 g/dL   RDW 18.9 (H) 11.5 - 15.5 %   Platelets 123 (L) 150 - 400 K/uL   nRBC 0.0 0.0 - 0.2 %  Protime-INR     Status: Abnormal   Collection Time: 07/18/19  6:03 PM  Result Value Ref Range   Prothrombin Time 47.8 (H) 11.4 - 15.2 seconds   INR 5.4 (HH) 0.8 - 1.2  Prepare fresh frozen plasma     Status: None   Collection Time: 07/18/19  7:06 PM  Result Value Ref Range   Unit Number [id number]    Blood Component Type THW PLS APHR    Unit division 00    Status of Unit ISSUED,FINAL    Transfusion Status      OK TO TRANSFUSE Performed at [facility], [address] [place], [place] [address]  Urinalysis, Routine w reflex microscopic     Status: Abnormal   Collection Time: 07/18/19  7:15 PM  Result Value Ref Range   Color, Urine RED (A) YELLOW   APPearance TURBID (A) CLEAR   Specific Gravity, Urine  1.005 - 1.030    TEST NOT REPORTED DUE TO COLOR INTERFERENCE OF URINE PIGMENT   pH  5.0 - 8.0    TEST NOT REPORTED DUE TO COLOR INTERFERENCE OF URINE PIGMENT   Glucose, UA (A) NEGATIVE mg/dL    TEST NOT REPORTED DUE TO COLOR INTERFERENCE OF URINE PIGMENT   Hgb  urine dipstick (A) NEGATIVE    TEST NOT REPORTED DUE TO COLOR INTERFERENCE OF URINE PIGMENT   Bilirubin Urine (A) NEGATIVE    TEST NOT REPORTED DUE TO COLOR INTERFERENCE OF URINE PIGMENT   Ketones, ur (A) NEGATIVE mg/dL    TEST NOT REPORTED DUE TO COLOR INTERFERENCE OF URINE PIGMENT   Protein, ur (A) NEGATIVE mg/dL    TEST NOT REPORTED DUE TO COLOR INTERFERENCE OF URINE PIGMENT   Nitrite (A) NEGATIVE    TEST NOT REPORTED DUE TO COLOR INTERFERENCE OF URINE PIGMENT   Leukocytes,Ua (A) NEGATIVE    TEST NOT REPORTED DUE TO COLOR INTERFERENCE OF URINE PIGMENT  Urinalysis, Microscopic (reflex)     Status: Abnormal   Collection Time: 07/18/19  7:15 PM  Result Value Ref Range   RBC / HPF >50 0 - 5 RBC/hpf   WBC, UA 6-10 0 - 5 WBC/hpf   Bacteria, UA RARE (A) NONE SEEN   Squamous Epithelial / LPF 0-5 0 - 5  Type and screen [place]     Status: None   Collection Time: 07/18/19  7:47 PM  Result Value Ref Range   ABO/RH(D) A POS    Antibody Screen NEG    Sample Expiration 07/21/2019,2359    Unit Number [id number]    Blood Component Type RED CELLS,LR    Unit division 00    Status of Unit ISSUED,FINAL    Transfusion Status OK TO TRANSFUSE    Crossmatch Result      Compatible Performed at [facility], [address] [place], [place] [address]   Prepare RBC (crossmatch)     Status: None   Collection Time: 07/18/19  7:47 PM  Result Value Ref Range   Order Confirmation      ORDER PROCESSED BY BLOOD BANK Performed at [facility], [place] [address], [place], [place] [address]   Glucose, capillary     Status: Abnormal   Collection Time: 07/18/19  9:47 PM  Result Value Ref Range   Glucose-Capillary 191 (H) 70 - 99 mg/dL  Glucose, capillary     Status: Abnormal   Collection Time: 07/19/19  6:02 AM  Result Value Ref Range   Glucose-Capillary 132 (H) 70 - 99 mg/dL  Protime-INR     Status: Abnormal   Collection Time: 07/19/19  7:09 AM  Result Value Ref Range    Prothrombin Time 27.0 (H) 11.4 - 15.2 seconds   INR 2.6 (H) 0.8 - 1.2  CBC with Differential/Platelet     Status: Abnormal   Collection Time: 07/19/19  7:09 AM  Result Value Ref Range   WBC 7.0 4.0 - 10.5 K/uL   RBC 2.78 (L) 3.87 - 5.11 MIL/uL   Hemoglobin 8.2 (L) 12.0 - 15.0 g/dL   HCT 26.5 (L) 36.0 - 46.0 %   MCV 95.3 80.0 - 100.0 fL  MCH 29.5 26.0 - 34.0 pg   MCHC 30.9 30.0 - 36.0 g/dL   RDW 18.1 (H) 11.5 - 15.5 %   Platelets 111 (L) 150 - 400 K/uL   nRBC 0.0 0.0 - 0.2 %   Neutrophils Relative % 66 %   Neutro Abs 4.6 1.7 - 7.7 K/uL   Lymphocytes Relative 14 %   Lymphs Abs 1.0 0.7 - 4.0 K/uL   Monocytes Relative 13 %   Monocytes Absolute 0.9 0.1 - 1.0 K/uL   Eosinophils Relative 6 %   Eosinophils Absolute 0.4 0.0 - 0.5 K/uL   Basophils Relative 1 %   Basophils Absolute 0.1 0.0 - 0.1 K/uL   Immature Granulocytes 0 %   Abs Immature Granulocytes 0.03 0.00 - 0.07 K/uL  Glucose, capillary     Status: Abnormal   Collection Time: 07/19/19 11:42 AM  Result Value Ref Range   Glucose-Capillary 210 (H) 70 - 99 mg/dL   Recent Results (from the past 240 hour(s))  Culture, Urine     Status: Abnormal   Collection Time: 07/11/19  2:11 PM   Specimen: Urine, Random  Result Value Ref Range Status   Specimen Description URINE, RANDOM  Final   Special Requests   Final    NONE Performed at [facility], [address], [place], [place] [address]    Culture >=100,000 COLONIES/mL CITROBACTER FREUNDII (A)  Final   Report Status 07/14/2019 FINAL  Final   Organism ID, Bacteria CITROBACTER FREUNDII (A)  Final      Susceptibility   Citrobacter freundii - MIC*    CEFAZOLIN >=64 RESISTANT Resistant     CEFTRIAXONE <=0.25 SENSITIVE Sensitive     CIPROFLOXACIN <=0.25 SENSITIVE Sensitive     GENTAMICIN <=1 SENSITIVE Sensitive     IMIPENEM 1 SENSITIVE Sensitive     NITROFURANTOIN <=16 SENSITIVE Sensitive     TRIMETH/SULFA <=20 SENSITIVE Sensitive     PIP/TAZO <=4 SENSITIVE Sensitive      * >=100,000 COLONIES/mL CITROBACTER FREUNDII    Renal Function: Recent Labs    07/17/19 0609 07/18/19 1442  CREATININE 0.87 0.99   Estimated Creatinine Clearance: 52.3 mL/min (by C-G formula based on SCr of 0.99 mg/dL).  Radiologic Imaging: DG Chest 2 View  Result Date: 07/18/2019 CLINICAL DATA:  Lethargy. Dizziness and nausea. EXAM: CHEST - 2 VIEW COMPARISON:  Chest CT 03/28/2017 FINDINGS: Moderate right pleural effusion with fluid tracking laterally and in the fissure. Mild cardiomegaly. Aortic atherosclerosis and tortuosity. Vascular congestion without pulmonary edema. No left pleural effusion. No pneumothorax. No acute osseous abnormalities are seen. IMPRESSION: 1. Moderate right pleural effusion with fluid tracking laterally and in the fissure. 2. Mild cardiomegaly with vascular congestion. Electronically Signed   By: Keith Rake M.D.   On: 07/18/2019 18:44   US RENAL  Result Date: 07/18/2019 CLINICAL DATA:  Hematuria EXAM: RENAL / URINARY TRACT ULTRASOUND COMPLETE COMPARISON:  Chest x-ray from earlier in the same day. FINDINGS: Right Kidney: Renal measurements: 11.3 x 4.6 x 5.3 cm. = volume: 145 mL . Echogenicity within normal limits. No mass or hydronephrosis visualized. Left Kidney: Renal measurements: 10.1 x 5.9 x 5.2 cm. = volume: 161 mL. Echogenicity within normal limits. No mass or hydronephrosis visualized. Bladder: Bladder is decompressed. Echogenic material is noted within which may be related to thrombus or focal mass. Given the history of hematuria this likely represents thrombus. Other: Mild ascites is noted. Large right-sided pleural effusion is seen. Cirrhotic changes of liver are noted. IMPRESSION: Normal  appearing kidneys bilaterally. Echogenic material within the bladder which is partially distended. Given the patient's history of hematuria this likely represents thrombus. Direct visualization may be helpful. Mild ascites and large right pleural effusion. Cirrhotic  change of the liver is noted. Electronically Signed   By: Inez Catalina M.D.   On: 07/18/2019 21:24    Procedure: Area was prepped and draped in sterile fashion. 22 3-way foley was placed without difficulty with immediate return of dark red urine.  Balloon was inflated with 20cc sterile saline.  Foley was hand irrigated with sterile saline multiple times with large amount of dark red clot return.  Irrigation was continued until return was light pink with no clots.  Foley was attached to bag until CBIs could be started.  Pt tolerated well.   Impression/Recommendation: Gross hematuria--pt with radiation cystitis and elevated INR.  IM and GI are treating her cirrhosis and coagulopathy.  Start CBIs and continue until urine is clear. Leave foley in place until urine is clear after irrigation is stopped. F/U urine culture and treat if positive.   I personally reviewed the patient and spoke to the patient and her family.  The CBI was crystal clear.  We will wait for the urine culture and if positive it will be treated.  Coagulopathy and radiation are risk factors for her hematuria.  I will follow patient.  Hopefully we can stop the continuous bladder irrigation soon and then send her home without a Foley catheter to follow up with Dr. Daphine Deutscher Dancy 07/19/2019, 11:49 AM

## 2019-07-19 NOTE — Progress Notes (Signed)
Reviewed s/sx of blood transfusion reaction with patient. Blood started.

## 2019-07-19 NOTE — Care Management (Addendum)
Patient ID: Brittany Russo, female   DOB: 1945/05/31, 74 y.o.   MRN: [id number] Discharge on hold due to medical issues; [facility] scheduled for today at 10:00 was cancelled. Patient's spouse was also notified of change in discharge date and notification of transportation of the delay.

## 2019-07-19 NOTE — Consult Note (Signed)
Referring Provider: Reesa Chew, PA-C Primary Care Physician:  Rosine Door Primary Gastroenterologist:  Althia Forts (previously had PCP with Sadie Haber)  Reason for Consultation:  Cirrhosis  HPI: Brittany Russo is a 74 y.o. female with extensive past medical history noted below to include radiation proctitis, cirrhosis secondary to NAFLD, recent above the knee amputation, and recent AKI presenting for consultation of cirrhosis.  Patient reports progressive abdominal distention over the past 3 to 4 weeks as well as left lower extremity edema for the past 1 to 2 weeks.  She denies any abdominal pain, nausea, vomiting, hematemesis.  She further denies dysphagia, heartburn.  Patient currently has 2-3 formed bowel movements per day on lactulose and rifaximin.  She states she is not on any other medications for management of cirrhosis at this time.  She took the lactulose and rifaximin inconsistently prior to admission.  She has seen intermittent small amounts of bright red blood per rectum.  She denies any loose stool or melena.  Notes decreased appetite but denies any unintentional weight loss.  Patient states she alternates between Tylenol and ibuprofen use.  Some days she takes Tylenol twice daily and some days she takes ibuprofen twice daily.  She states she was on blood thinners during hospitalization but was not on blood thinners prior to admission.  She denies any alcohol use.  Patient denies having an established gastroenterologist or hepatologist to treat her cirrhosis.  Patient had renal ultrasound 07/18/19 which showed mild ascites and large right-sided pleural effusion as well as cirrhosis of the liver.  Patient underwent EGD 12/30/2018 which was pertinent for small hiatal hernia, a single gastric polyp, and atrophic gastritis.  The exam was otherwise normal with no signs of esophageal varices or portal hypertensive gastropathy.  Flexible sigmoidoscopy 12/30/2018 showed mucosal  ulceration treated with radiofrequency ablation, compatible with radiation damage.  It was recommended to use Canasa 1064m suppository QHS indefinitely.  Past Medical History:  Diagnosis Date  . Acute and subacute hepatic failure without coma   . Allergy   . Anasarca   . Anxiety   . Arthritis   . Ascites 09/2018   no SBP on 850 cc tap 10/13/18  . Asthma    allery induced  . Cirrhosis [place] liver ([place]   from NAFLD.  D[facility] ~ [facility]    . Depression   . Diabetes mellitus without complication ([place]    type 2  . Eczema of both hands   . Endometrial cancer ([facility] 1996, 2003   hysterectomy 1996.  recurrence 2003, treated with radiation.    . Generalized edema   . GI hemorrhage   . Hematochezia 2009   radiation proctosigmoiditis on colonoscopy 06/2007[place] [facility] [place] . Hyperlipidemia   . Hypertension   . Muscle weakness   . Seizures ([place]    last seizure June 07, 2014   . [place]rombocytopenia ([place]   . Thyroid nodule     Past Surgical History:  Procedure Laterality Date  . AMPUTATION Right 06/29/2019   Procedure: AMPUTATION ABOVE KNEE through knee;  Surgeon: OParalee Cancel MD;  [facility];  Service: Orthopedics;  Laterality: Right;  2 hrs  . CATARACT EXTRACTION W/PHACO Left 06/04/2017   Procedure: CATARACT EXTRACTION PHACO AND INTRAOCULAR LENS PLACEMENT (IOC);  Surgeon: WBaruch Goldmann MD;  Location: [facility];  Service: Ophthalmology;  Laterality: Left;  CDE: 3.90  . CATARACT EXTRACTION W/PHACO Right 07/15/2017  Procedure: CATARACT EXTRACTION PHACO AND INTRAOCULAR LENS PLACEMENT (IOC);  Surgeon: Baruch Goldmann, MD;  Location: [facility];  Service: Ophthalmology;  Laterality: Right;  CDE: 7.60  . COLONOSCOPY  06/2007   in [place]. for hematochzia.  radiation proctitis  . DILATION AND CURETTAGE OF UTERUS    . ESOPHAGOGASTRODUODENOSCOPY  2016  . ESOPHAGOGASTRODUODENOSCOPY (EGD) WITH PROPOFOL N/A 12/30/2018   Procedure: EGD;  Surgeon: Clarene Essex, MD;  [facility];  Service: Endoscopy;  Laterality: N/A;  . EXCISIONAL TOTAL KNEE ARTHROPLASTY Right 01/03/2019   Procedure: EXCISIONAL TOTAL KNEE ARTHROPLASTY;  Surgeon: Paralee Cancel, MD;  [facility]: [facility];  Service: Orthopedics;  Laterality: Right;  . FLEXIBLE SIGMOIDOSCOPY N/A 12/30/2018   Procedure: FLEXIBLE SIGMOIDOSCOPY;  Surgeon: Clarene Essex, MD;  [facility];  Service: Endoscopy;  Laterality: N/A;  . IR PARACENTESIS  10/13/2018  . STERIOD INJECTION Left 06/29/2019   Procedure: LEFT KNEE INJECTION;  Surgeon: Paralee Cancel, MD;  [facility];  Service: Orthopedics;  Laterality: Left;  . TOTAL ABDOMINAL HYSTERECTOMY  1996  . TOTAL KNEE ARTHROPLASTY Right 09/22/2018   Procedure: TOTAL KNEE ARTHROPLASTY;  Surgeon: Paralee Cancel, MD;  [facility];  Service: Orthopedics;  Laterality: Right;  70 mins  . TOTAL KNEE REVISION Right 09/27/2018   Procedure: TOTAL KNEE REVISION;  Surgeon: Paralee Cancel, MD;  [facility];  Service: Orthopedics;  Laterality: Right;  . TOTAL KNEE REVISION Right 11/22/2018   Procedure: repair right knee extensor mechanism;  Surgeon: Paralee Cancel, MD;  Location: [facility];  Service: Orthopedics;  Laterality: Right;  90 mins    Prior to Admission medications   Medication Sig Start Date End Date Taking? Authorizing Provider  acetaminophen (TYLENOL) 325 MG tablet Take 1-2 tablets (325-650 mg total) by mouth every 4 (four) hours as needed for mild pain. 07/13/19   Love, Ivan Anchors, PA-C  acetaminophen (TYLENOL) 500 MG tablet Take 1-2 tablets (500-1,000 mg total) by mouth every 8 (eight) hours as needed. 07/03/19   Danae Orleans, PA-C  albuterol (VENTOLIN HFA) 108 (90 Base) MCG/ACT inhaler Inhale 1-2 puffs into the lungs every 6 (six) hours as needed for wheezing or shortness of breath.    [provider]  ALPRAZolam Duanne Moron) 0.25 MG tablet Take 1 tablet (0.25 mg total) by mouth 2 (two) times daily. 01/06/19   Mariel Aloe, MD  atenolol  (TENORMIN) 50 MG tablet Take 50 mg by mouth daily.    [provider]  atorvastatin (LIPITOR) 80 MG tablet Take 80 mg by mouth daily at 8 pm.    [provider]  buPROPion (WELLBUTRIN XL) 150 MG 24 hr tablet Take 150 mg by mouth daily.    [provider]  docusate sodium (COLACE) 100 MG capsule Take 1 capsule (100 mg total) by mouth 2 (two) times daily. 07/03/19   Danae Orleans, PA-C  escitalopram (LEXAPRO) 20 MG tablet Take 20 mg by mouth daily. 05/17/19   [provider]  ferrous sulfate (FERROUSUL) 325 (65 FE) MG tablet Take 1 tablet (325 mg total) by mouth 3 (three) times daily with meals for 14 days. 07/03/19 07/17/19  Danae Orleans, PA-C  fluticasone (FLONASE) 50 MCG/ACT nasal spray Place 1 spray into both nostrils daily.     [provider]  gabapentin (NEURONTIN) 300 MG capsule Take 300 mg by mouth daily as needed (pain).     [provider]  glipiZIDE (GLUCOTROL) 5 MG tablet Take 5 mg by mouth 2 (two) times daily.  [provider]  glucagon (GLUCAGON EMERGENCY) 1 MG injection Inject 1 mg into the muscle once as needed (blood sugar less than 60).    [provider]  Insulin Glargine (BASAGLAR KWIKPEN) 100 UNIT/ML Inject 20-30 Units into the skin daily.    [provider]  lactulose (CHRONULAC) 10 GM/15ML solution Take 30 mLs (20 g total) by mouth 2 (two) times daily. Patient taking differently: Take 10 g by mouth 2 (two) times a week.  01/06/19   Mariel Aloe, MD  lip balm (CARMEX) ointment Apply topically as needed for lip care. 07/13/19   Love, Ivan Anchors, PA-C  loratadine (CLARITIN) 10 MG tablet Take 10 mg by mouth daily.    [provider]  methocarbamol (ROBAXIN) 500 MG tablet Take 1 tablet (500 mg total) by mouth every 6 (six) hours as needed for muscle spasms. 07/03/19   Danae Orleans, PA-C  Multiple Vitamin (MULTIVITAMIN WITH MINERALS) TABS tablet Take 1 tablet by mouth daily. 12/06/18    Danae Orleans, PA-C  polyethylene glycol (MIRALAX / GLYCOLAX) 17 g packet Take 17 g by mouth 2 (two) times daily. 07/03/19   Danae Orleans, PA-C  rifaximin (XIFAXAN) 550 MG TABS tablet Take 550 mg by mouth 2 (two) times daily.    [provider]  rivaroxaban (XARELTO) 10 MG TABS tablet Take 1 tablet (10 mg total) by mouth daily. 07/03/19   Danae Orleans, PA-C  sitaGLIPtin-metformin (JANUMET) 50-500 MG per tablet Take 1 tablet by mouth 2 (two) times daily with a meal.    [provider]  traMADol (ULTRAM) 50 MG tablet Take 1-2 tablets (50-100 mg total) by mouth every 6 (six) hours as needed (1 pill scale 1-5; 2 pills scale 6-10). 07/03/19 07/02/20  Danae Orleans, PA-C  traZODone (DESYREL) 50 MG tablet Take 50 mg by mouth at bedtime as needed for sleep.  05/17/19   [provider]  Ibuprofen-Diphenhydramine Cit (ADVIL PM PO) Take 60 mg by mouth at bedtime.  05/21/17  [provider]    Scheduled Meds: . ALPRAZolam  0.25 mg Oral BID  . atenolol  50 mg Oral Daily  . atorvastatin  80 mg Oral Q2000  . buPROPion  150 mg Oral Daily  . docusate sodium  100 mg Oral BID  . escitalopram  20 mg Oral Daily  . ferrous sulfate  325 mg Oral BID WC  . fluticasone  1 spray Each Nare Daily  . furosemide  20 mg Intravenous Daily  . gabapentin  300 mg Oral BID  . Gerhardt's butt cream   Topical QID  . insulin aspart  0-15 Units Subcutaneous TID WC  . insulin aspart  0-5 Units Subcutaneous QHS  . lactulose  30 g Oral BID  . linagliptin  5 mg Oral Daily  . loratadine  10 mg Oral Daily  . metFORMIN  500 mg Oral BID WC  . rifaximin  550 mg Oral BID   Continuous Infusions: . sodium chloride irrigation     PRN Meds:.acetaminophen, albuterol, alum & mag hydroxide-simeth, bisacodyl, diphenhydrAMINE, guaiFENesin-dextromethorphan, HYDROcodone-acetaminophen, lip balm, magnesium citrate, methocarbamol, polyethylene glycol, prochlorperazine **OR** prochlorperazine **OR**  prochlorperazine, sodium phosphate, traMADol, traZODone  Allergies as of 07/04/2019 - Review Complete 07/04/2019  Allergen Reaction Noted  . Asa [aspirin] Other (See Comments) 09/22/2018  . Other Other (See Comments) 03/06/2013    Family History  Problem Relation Age of Onset  . Heart disease Mother   . Diabetes Mother   . Heart disease Father   .  Cancer Father   . Cancer Maternal Grandmother   . Heart disease Maternal Grandfather     Social History   Socioeconomic History  . Marital status: Married    Spouse name: Not on file  . Number of children: Not on file  . Years of education: Not on file  . Highest education level: Not on file  Occupational History  . Not on file  Tobacco Use  . Smoking status: Never Smoker  . Smokeless tobacco: Never Used  Substance and Sexual Activity  . Alcohol use: No  . Drug use: No  . Sexual activity: Not Currently  Other Topics Concern  . Not on file  Social History Narrative  . Not on file   Social Determinants of Health   Financial Resource Strain:   . Difficulty of Paying Living Expenses:   Food Insecurity:   . Worried About Charity fundraiser in the Last Year:   . Arboriculturist in the Last Year:   Transportation Needs:   . Film/video editor (Medical):   [name] Lack of Transportation (Non-Medical):   Physical Activity:   . Days of Exercise per Week:   . Minutes of Exercise per Session:   Stress:   . Feeling of Stress :   Social Connections:   . Frequency of Communication with Friends and Family:   . Frequency of Social Gatherings with Friends and Family:   . Attends Religious Services:   . Active Member of Clubs or Organizations:   . Attends Archivist Meetings:   [name] Marital Status:   Intimate Partner Violence:   . Fear of Current or Ex-Partner:   . Emotionally Abused:   [name] Physically Abused:   . Sexually Abused:     Review of Systems: Review of Systems  Constitutional: Positive for malaise/fatigue.  Negative for chills and fever.  HENT: Negative for hearing loss and tinnitus.   Eyes: Negative for blurred vision and pain.  Respiratory: Negative for cough and shortness of breath.   Cardiovascular: Negative for chest pain and palpitations.  Gastrointestinal: Negative for abdominal pain, blood in stool, constipation, diarrhea, heartburn, melena, nausea and vomiting.  Genitourinary: Positive for hematuria. Negative for flank pain.  Musculoskeletal: Positive for falls and myalgias.  Skin: Negative for itching and rash.  Neurological: Positive for weakness. Negative for loss of consciousness.  Endo/Heme/Allergies: Negative for polydipsia. Does not bruise/bleed easily.  Psychiatric/Behavioral: Negative for substance abuse. The patient is not nervous/anxious.      Physical Exam: Vital signs: Vitals:   07/19/19 0558 07/19/19 0941  BP: (!) 127/52 (!) 143/61  Pulse: 71 74  Resp: 16 20  Temp: 98.8 F (37.1 C) (!) 97.5 F (36.4 C)  SpO2: 98% 95%   Last BM Date: 07/19/19  Physical Exam  Constitutional: She is oriented to person, place, and time. She appears well-developed and well-nourished. She appears lethargic. No distress.  HENT:  Head: Normocephalic and atraumatic.  Eyes: Conjunctivae and EOM are normal. No scleral icterus.  Cardiovascular: Normal rate, regular rhythm and normal heart sounds.  Pulmonary/Chest: Effort normal and breath sounds normal. No respiratory distress.  Abdominal: Soft. Bowel sounds are normal. She exhibits distension (with typmany). She exhibits no mass. There is no abdominal tenderness. There is no rebound and no guarding.  Musculoskeletal:        General: Edema (2+ pitting edema of left lower extremity) present.     Cervical back: Normal range of motion and neck supple.  Comments: Right above the knee amputation with dressing in place  Neurological: She is oriented to person, place, and time. She appears lethargic.  +asterixis  Skin: Skin is warm and  dry.  Psychiatric: She has a normal mood and affect. Her behavior is normal.    GI:  Lab Results: Recent Labs    07/18/19 0606 07/18/19 1803 07/19/19 0709  WBC 8.5 7.6 7.0  HGB 7.7* 6.8* 8.2*  HCT 25.8* 22.3* 26.5*  PLT 134* 123* 111*   BMET Recent Labs    07/17/19 0609 07/18/19 1442  NA 130* 131*  K 4.4 4.6  CL 95* 98  CO2 26 21*  GLUCOSE 147* 136*  BUN 13 19  CREATININE 0.87 0.99  CALCIUM 8.2* 8.4*   LFT Recent Labs    07/18/19 1442  PROT 4.9*  ALBUMIN 2.1*  AST 61*  ALT 53*  ALKPHOS 127*  BILITOT 1.9*   PT/INR Recent Labs    07/18/19 1803 07/19/19 0709  LABPROT 47.8* 27.0*  INR 5.4* 2.6*     Studies/Results: DG Chest 2 View  Result Date: 07/18/2019 CLINICAL DATA:  Lethargy. Dizziness and nausea. EXAM: CHEST - 2 VIEW COMPARISON:  Chest CT 03/28/2017 FINDINGS: Moderate right pleural effusion with fluid tracking laterally and in the fissure. Mild cardiomegaly. Aortic atherosclerosis and tortuosity. Vascular congestion without pulmonary edema. No left pleural effusion. No pneumothorax. No acute osseous abnormalities are seen. IMPRESSION: 1. Moderate right pleural effusion with fluid tracking laterally and in the fissure. 2. Mild cardiomegaly with vascular congestion. Electronically Signed   By: Keith Rake M.D.   On: 07/18/2019 18:44   US RENAL  Result Date: 07/18/2019 CLINICAL DATA:  Hematuria EXAM: RENAL / URINARY TRACT ULTRASOUND COMPLETE COMPARISON:  Chest x-ray from earlier in the same day. FINDINGS: Right Kidney: Renal measurements: 11.3 x 4.6 x 5.3 cm. = volume: 145 mL . Echogenicity within normal limits. No mass or hydronephrosis visualized. Left Kidney: Renal measurements: 10.1 x 5.9 x 5.2 cm. = volume: 161 mL. Echogenicity within normal limits. No mass or hydronephrosis visualized. Bladder: Bladder is decompressed. Echogenic material is noted within which may be related to thrombus or focal mass. Given the history of hematuria this likely  represents thrombus. Other: Mild ascites is noted. Large right-sided pleural effusion is seen. Cirrhotic changes of liver are noted. IMPRESSION: Normal appearing kidneys bilaterally. Echogenic material within the bladder which is partially distended. Given the patient's history of hematuria this likely represents thrombus. Direct visualization may be helpful. Mild ascites and large right pleural effusion. Cirrhotic change of the liver is noted. Electronically Signed   By: Inez Catalina M.D.   On: 07/18/2019 21:24    Impression: NASH Cirrhosis, decompensated; MELD score of 30 as of 07/18/19 -T bili 1.9/AST 61/ALT 53/alk phos 127 on 4/27; CMP pending for today -Hyponatremia: Sodium 131 on 4/27 -Coagulopathy: INR 4.9 and 5.4 yesterday 4/27 with improvement to 2.6 after 1u FFP last night -Asterixis; ammonia 34 on 4/27 -Renal function stable: BUN 19/0.99/GFR 57 as of 07/18/2019  Acute on chronic anemia:  Hgb 8.2 today, increased from 6.8 yesterday after 1u pRBCs this morning.   patient denies bloody stools; however, brown/red bowel movement recorded by RN today.  Black BMs documented on 4/25 and 4/26.  Pt is on iron supplementation.  Hematuria, UTI, h/o radiation cystitis; urology consulted by primary team  Plan: -Initiate spironolactone 152m daily  -Continue 281mLasix -Monitor electrolytes, BUN/Cr -Continue lactulose and rifaximin: titrate for 2-3 soft BMs per day. -Restart mesalamine  suppository for radiation proctitis.  If this does not improve rectal bleeding, consider repeat flex sig in 2-3 weeks -Avoid NSAIDS, including ibuprofen -Limit acetaminophen to 2g/day   LOS: 15 days   Salley Slaughter  PA-C 07/19/2019, 11:55 AM  Contact #  [phone]

## 2019-07-19 NOTE — Procedures (Signed)
PROCEDURE SUMMARY:  Successful US guided right thoracentesis. Yielded 1.1 L of clear orange fluid. Patient tolerated procedure well. No immediate complications. EBL = trace  Specimen was sent for labs.  Post procedure chest X-ray pending.  Paityn Balsam S Sybrina Laning PA-C 07/19/2019 3:06 PM

## 2019-07-20 ENCOUNTER — Inpatient Hospital Stay (HOSPITAL_COMMUNITY): Payer: Medicare Other

## 2019-07-20 DIAGNOSIS — Z9889 Other specified postprocedural states: Secondary | ICD-10-CM

## 2019-07-20 LAB — CBC WITH DIFFERENTIAL/PLATELET
Abs Immature Granulocytes: 0.02 10*3/uL (ref 0.00–0.07)
Basophils Absolute: 0.1 10*3/uL (ref 0.0–0.1)
Basophils Relative: 1 %
Eosinophils Absolute: 0.3 10*3/uL (ref 0.0–0.5)
Eosinophils Relative: 4 %
HCT: 27.5 % — ABNORMAL LOW (ref 36.0–46.0)
Hemoglobin: 8.6 g/dL — ABNORMAL LOW (ref 12.0–15.0)
Immature Granulocytes: 0 %
Lymphocytes Relative: 10 %
Lymphs Abs: 0.7 10*3/uL (ref 0.7–4.0)
MCH: 29.3 pg (ref 26.0–34.0)
MCHC: 31.3 g/dL (ref 30.0–36.0)
MCV: 93.5 fL (ref 80.0–100.0)
Monocytes Absolute: 0.9 10*3/uL (ref 0.1–1.0)
Monocytes Relative: 12 %
Neutro Abs: 5.3 10*3/uL (ref 1.7–7.7)
Neutrophils Relative %: 73 %
Platelets: 90 10*3/uL — ABNORMAL LOW (ref 150–400)
RBC: 2.94 MIL/uL — ABNORMAL LOW (ref 3.87–5.11)
RDW: 18.5 % — ABNORMAL HIGH (ref 11.5–15.5)
WBC: 7.3 10*3/uL (ref 4.0–10.5)
nRBC: 0 % (ref 0.0–0.2)

## 2019-07-20 LAB — GLUCOSE, CAPILLARY
Glucose-Capillary: 158 mg/dL — ABNORMAL HIGH (ref 70–99)
Glucose-Capillary: 167 mg/dL — ABNORMAL HIGH (ref 70–99)
Glucose-Capillary: 179 mg/dL — ABNORMAL HIGH (ref 70–99)
Glucose-Capillary: 168 mg/dL — ABNORMAL HIGH (ref 70–99)

## 2019-07-20 LAB — URINALYSIS, ROUTINE W REFLEX MICROSCOPIC
Bilirubin Urine: NEGATIVE
Glucose, UA: NEGATIVE mg/dL
Ketones, ur: NEGATIVE mg/dL
Nitrite: NEGATIVE
Protein, ur: 100 mg/dL — AB
RBC / HPF: >50 RBC/hpf — ABNORMAL HIGH (ref 0–5)
Specific Gravity, Urine: 1.015 (ref 1.005–1.030)
WBC, UA: >50 WBC/hpf — ABNORMAL HIGH (ref 0–5)
pH: 5 (ref 5.0–8.0)

## 2019-07-20 LAB — COMPREHENSIVE METABOLIC PANEL
ALT: 56 U/L — ABNORMAL HIGH (ref 0–44)
AST: 62 U/L — ABNORMAL HIGH (ref 15–41)
Albumin: 2.1 g/dL — ABNORMAL LOW (ref 3.5–5.0)
Alkaline Phosphatase: 140 U/L — ABNORMAL HIGH (ref 38–126)
Anion gap: 8 (ref 5–15)
BUN: 15 mg/dL (ref 8–23)
CO2: 24 mmol/L (ref 22–32)
Calcium: 8.3 mg/dL — ABNORMAL LOW (ref 8.9–10.3)
Chloride: 99 mmol/L (ref 98–111)
Creatinine, Ser: 0.82 mg/dL (ref 0.44–1.00)
GFR calc Af Amer: >60 mL/min (ref >60)
GFR calc non Af Amer: >60 mL/min (ref >60)
Glucose, Bld: 159 mg/dL — ABNORMAL HIGH (ref 70–99)
Potassium: 4.3 mmol/L (ref 3.5–5.1)
Sodium: 131 mmol/L — ABNORMAL LOW (ref 135–145)
Total Bilirubin: 2 mg/dL — ABNORMAL HIGH (ref 0.3–1.2)
Total Protein: 5.1 g/dL — ABNORMAL LOW (ref 6.5–8.1)

## 2019-07-20 LAB — PROTIME-INR
INR: 1.4 — ABNORMAL HIGH (ref 0.8–1.2)
Prothrombin Time: 16.6 seconds — ABNORMAL HIGH (ref 11.4–15.2)

## 2019-07-20 MED ORDER — OXYBUTYNIN CHLORIDE 5 MG PO TABS
2.5000 mg | ORAL_TABLET | Freq: Three times a day (TID) | ORAL | Status: DC | PRN
  Administered 2019-07-20 – 2019-07-23 (×4): 2.5 mg via ORAL
  Filled 2019-07-20 (×4): qty 1

## 2019-07-20 MED ORDER — BUMETANIDE 1 MG PO TABS
1.0000 mg | ORAL_TABLET | Freq: Every day | ORAL | Status: DC
  Filled 2019-07-20: qty 1

## 2019-07-20 MED ORDER — FUROSEMIDE 10 MG/ML IJ SOLN
40.0000 mg | Freq: Every day | INTRAMUSCULAR | Status: DC
  Administered 2019-07-21 – 2019-07-22 (×2): 40 mg via INTRAVENOUS
  Filled 2019-07-20 (×3): qty 4

## 2019-07-20 MED ORDER — FUROSEMIDE 10 MG/ML IJ SOLN
20.0000 mg | Freq: Once | INTRAMUSCULAR | Status: AC
  Administered 2019-07-20: 20 mg via INTRAVENOUS
  Filled 2019-07-20: qty 2

## 2019-07-20 NOTE — Progress Notes (Signed)
Team Conference Report to Patient/Family  Team Conference discussion was reviewed with the patient and caregiver, including goals, any changes in plan of care and target discharge date.  Patient and caregiver express understanding and are in agreement.  The patient has a target discharge date of 07/21/19. However after review by Dr. Posey Pronto, he would like to monitor the patient over the weekend so likely not to discharge until Tuesday. He will confirm discharge Monday morning. HH and transportation have been notified of the delay of discharge due to medical issues.  Brittany Russo 07/20/2019, 2:10 PM

## 2019-07-20 NOTE — Progress Notes (Signed)
CBI discontinued per order

## 2019-07-20 NOTE — Progress Notes (Signed)
PROGRESS NOTE    Brittany Russo  ZSW:[id number] DOB: [birth year] DOA: 07/04/2019 PCP: Rosalee Kaufman, PA-C   Brief Narrative:  Consult for coagulopathy and hematuria. Have consulted urology and order foley catheter to placed.  Assessment & Plan   Coagulopathy/Elevated INR -INR was as high as 5.4 -Suspect secondary to impaired hepatic function given that patient has had a history of liver cirrhosis -Patient was given FFP as well as vitamin K -INR has improved to 2.6 From 5.4 -pending INR today  Hematuria/UTI -Patient was had a history of endometrial cancer status post chemotherapy and radiation -Question if this is due to radiation cystitis -Reports having had frequent urinary tract infections which were treated with antibiotics -Overnight per nursing report, patient did have in and out cath and was noted to have passage of blood clots -Urology consulted and appreciated.  Discussed with Dr. Matilde Sprang, who recommended placing a 20 French Foley catheter and hand irrigation. -Ultrasound showed normal-appearing kidneys bilaterally.  Echogenic material within the bladder, which is partially distended-likely represents thrombus.  Mild ascites and large right pleural effusion.  Cirrhotic change of the liver noted. -CBI on 4/28, urine appears clear today -pending repeat UA and urine culture  Acute on chronic iron deficiency anemia -was transfused 1u PRBCs on 4/27 -hemoglobin up to 8.6 -continue to monitor CBC -Mesalamine added by gastroenterology-recommended flexible sigmoidoscopy in 2 to 3 weeks  NASH with cirrhosis, decompensated -Continue atenolol, lactulose, rifaximin -?why patient is not on diuretics -started IV lasix 36m daily on 4/28 -Gastroenterology consulted and appreciated, recommended Aldactone 100 mg daily along with Lasix 20 mg daily.  Hypervolemia with pleural effusion/acute on chronic diastolic heart failure -likely secondary to cirrhosis -Echocardiogram July  2020 showed EF 632-20% LV diastolic Doppler parameters consistent with impaired relaxation -Status post thoracentesis yielding 1.1 L of fluid -Patient has been started on diuretics including IV Lasix as well as spironolactone -Monitor intake and output, daily weights  Right knee AKA -Continue therapy  Hyponatremia -possibly related to cirrhosis -continue BMP  Depression and anxiety -Continue lexapro amd xanax  Diabetes mellitus, type II -Continue metformin, Tradjenta, insulin sliding scale with CBG monitoring  DVT Prophylaxis currently with anemia, may consider SCDs  Code Status: Full  Family Communication: None at bedside  Disposition Plan: Per primary.  [facility]  Procedures  Right AKA Renal ultrasound  Antibiotics   Anti-infectives (From admission, onward)   Start     Dose/Rate Route Frequency Ordered Stop   07/12/19 1000  nitrofurantoin (macrocrystal-monohydrate) (MACROBID) capsule 100 mg     100 mg Oral Every 12 hours 07/12/19 0839 07/18/19 2211   07/04/19 2000  rifaximin (XIFAXAN) tablet 550 mg     550 mg Oral 2 times daily 07/04/19 1401        Subjective:   [name].  Patient states she feels tired this morning.  Feels irritation from the Foley catheter.  She currently denies any chest pain or shortness of breath, abdominal pain, nausea or vomiting, diarrhea or constipation.  Objective:   Vitals:   07/19/19 1213 07/19/19 2101 07/20/19 0501 07/20/19 0855  BP: (!) 157/54 (!) 123/56 (!) 155/72 123/61  Pulse: 73 98 90 90  Resp: 18 16 18    Temp: 98.1 F (36.7 C) 98.9 F (37.2 C) 98.4 F (36.9 C)   TempSrc:  Oral Oral   SpO2: 100% 96% 96%   Weight:      Height:        Intake/Output Summary (  Last 24 hours) at 07/20/2019 1047 Last data filed at 07/20/2019 1030 Gross per 24 hour  Intake 20000 ml  Output 32175 ml  Net -12175 ml   Filed Weights   07/05/19 0700  Weight: 73.6 kg   Exam  General: Well  developed, chronically ill-appearing, NAD  HEENT: NCAT, mucous membranes moist.   Neck: Supple, no JVD, no masses  Cardiovascular: S1 S2 auscultated, RRR, soft SEM   Respiratory: Clear to auscultation bilaterally  Abdomen: Soft, nontender, distended, + bowel sounds  Extremities: R AKA. LLE edema 2+.   Neuro: AAOx3, nonfocal  Psych: Appropriate mood and affect  Data Reviewed: I have personally reviewed following labs and imaging studies  CBC: Recent Labs  Lab 07/14/19 0800 07/14/19 0800 07/15/19 0855 07/15/19 0855 07/17/19 0609 07/18/19 0606 07/18/19 1803 07/19/19 0709 07/20/19 0542  WBC 3.5*   < > 5.3   < > 6.7 8.5 7.6 7.0 7.3  NEUTROABS 2.4  --  3.7  --   --  5.1  --  4.6 5.3  HGB 8.1*   < > 7.8*   < > 7.9* 7.7* 6.8* 8.2* 8.6*  HCT 26.6*   < > 25.2*   < > 25.4* 25.8* 22.3* 26.5* 27.5*  MCV 95.3   < > 93.7   < > 97.7 98.9 95.3 95.3 93.5  PLT 76*   < > 99*   < > 129* 134* 123* 111* 90*   < > = values in this interval not displayed.   Basic Metabolic Panel: Recent Labs  Lab 07/17/19 0609 07/18/19 1442 07/20/19 0542  NA 130* 131* 131*  K 4.4 4.6 4.3  CL 95* 98 99  CO2 26 21* 24  GLUCOSE 147* 136* 159*  BUN 13 19 15   CREATININE 0.87 0.99 0.82  CALCIUM 8.2* 8.4* 8.3*   GFR: Estimated Creatinine Clearance: 63.2 mL/min (by C-G formula based on SCr of 0.82 mg/dL). Liver Function Tests: Recent Labs  Lab 07/18/19 1442 07/20/19 0542  AST 61* 62*  ALT 53* 56*  ALKPHOS 127* 140*  BILITOT 1.9* 2.0*  PROT 4.9* 5.1*  ALBUMIN 2.1* 2.1*   No results for input(s): LIPASE, AMYLASE in the last 168 hours. Recent Labs  Lab 07/18/19 1423  AMMONIA 34   Coagulation Profile: Recent Labs  Lab 07/18/19 1442 07/18/19 1803 07/19/19 0709  INR 4.9* 5.4* 2.6*   Cardiac Enzymes: No results for input(s): CKTOTAL, CKMB, CKMBINDEX, TROPONINI in the last 168 hours. BNP (last 3 results) No results for input(s): PROBNP in the last 8760 hours. HbA1C: No results for  input(s): HGBA1C in the last 72 hours. CBG: Recent Labs  Lab 07/19/19 0602 07/19/19 1142 07/19/19 1712 07/19/19 2104 07/20/19 0616  GLUCAP 132* 210* 188* 157* 158*   Lipid Profile: No results for input(s): CHOL, HDL, LDLCALC, TRIG, CHOLHDL, LDLDIRECT in the last 72 hours. Thyroid Function Tests: No results for input(s): TSH, T4TOTAL, FREET4, T3FREE, THYROIDAB in the last 72 hours. Anemia Panel: No results for input(s): VITAMINB12, FOLATE, FERRITIN, TIBC, IRON, RETICCTPCT in the last 72 hours. Urine analysis:    Component Value Date/Time   COLORURINE RED (A) 07/18/2019 1915   APPEARANCEUR TURBID (A) 07/18/2019 1915   LABSPEC  07/18/2019 1915    TEST NOT REPORTED DUE TO COLOR INTERFERENCE OF URINE PIGMENT   PHURINE  07/18/2019 1915    TEST NOT REPORTED DUE TO COLOR INTERFERENCE OF URINE PIGMENT   GLUCOSEU (A) 07/18/2019 1915    TEST NOT REPORTED DUE TO COLOR INTERFERENCE OF  URINE PIGMENT   HGBUR (A) 07/18/2019 1915    TEST NOT REPORTED DUE TO COLOR INTERFERENCE OF URINE PIGMENT   BILIRUBINUR (A) 07/18/2019 1915    TEST NOT REPORTED DUE TO COLOR INTERFERENCE OF URINE PIGMENT   BILIRUBINUR NEGATIVE 03/06/2013 1100   KETONESUR (A) 07/18/2019 1915    TEST NOT REPORTED DUE TO COLOR INTERFERENCE OF URINE PIGMENT   PROTEINUR (A) 07/18/2019 1915    TEST NOT REPORTED DUE TO COLOR INTERFERENCE OF URINE PIGMENT   UROBILINOGEN negative 03/06/2013 1100   NITRITE (A) 07/18/2019 1915    TEST NOT REPORTED DUE TO COLOR INTERFERENCE OF URINE PIGMENT   LEUKOCYTESUR (A) 07/18/2019 1915    TEST NOT REPORTED DUE TO COLOR INTERFERENCE OF URINE PIGMENT   Sepsis Labs: @LABRCNTIP (procalcitonin:4,lacticidven:4)  ) Recent Results (from the past 240 hour(s))  Culture, Urine     Status: Abnormal   Collection Time: 07/11/19  2:11 PM   Specimen: Urine, Random  Result Value Ref Range Status   Specimen Description URINE, RANDOM  Final   Special Requests   Final    NONE Performed at [facility], [place] [address] [place], [place] [address]    Culture >=100,000 COLONIES/mL CITROBACTER FREUNDII (A)  Final   Report Status 07/14/2019 FINAL  Final   Organism ID, Bacteria CITROBACTER FREUNDII (A)  Final      Susceptibility   Citrobacter freundii - MIC*    CEFAZOLIN >=64 RESISTANT Resistant     CEFTRIAXONE <=0.25 SENSITIVE Sensitive     CIPROFLOXACIN <=0.25 SENSITIVE Sensitive     GENTAMICIN <=1 SENSITIVE Sensitive     IMIPENEM 1 SENSITIVE Sensitive     NITROFURANTOIN <=16 SENSITIVE Sensitive     TRIMETH/SULFA <=20 SENSITIVE Sensitive     PIP/TAZO <=4 SENSITIVE Sensitive     * >=100,000 COLONIES/mL CITROBACTER FREUNDII  Culture, Urine     Status: Abnormal   Collection Time: 07/18/19  7:12 PM   Specimen: Urine, Random  Result Value Ref Range Status   Specimen Description URINE, RANDOM  Final   Special Requests   Final    NONE Performed at [facility], [place] [address], [place], [place] [address]    Culture MULTIPLE SPECIES PRESENT, SUGGEST RECOLLECTION (A)  Final   Report Status 07/19/2019 FINAL  Final  Gram stain     Status: None   Collection Time: 07/19/19  3:22 PM   Specimen: Lung, Right; Pleural Fluid  Result Value Ref Range Status   Specimen Description FLUID RIGHT PLEURAL  Final   Special Requests NONE  Final   Gram Stain   Final    MODERATE WBC PRESENT, PREDOMINANTLY MONONUCLEAR NO ORGANISMS SEEN Performed at [facility], [place] [address] [place], [place] [address]    Report Status 07/19/2019 FINAL  Final  Culture, body fluid-bottle     Status: None (Preliminary result)   Collection Time: 07/19/19  3:22 PM   Specimen: Fluid  Result Value Ref Range Status   Specimen Description FLUID RIGHT PLEURAL  Final   Special Requests BOTTLES DRAWN AEROBIC AND ANAEROBIC 10CC  Final   Culture   Final    NO GROWTH < 24 HOURS Performed at [facility], [place] [address] [place],  [address]    Report Status PENDING  Incomplete      Radiology  Studies: DG Chest 1 View  Result Date: 07/19/2019 CLINICAL DATA:  Status post right thoracentesis EXAM: CHEST  1 VIEW COMPARISON:  07/18/2019 FINDINGS:  Cardiac shadow remains enlarged. Aortic calcifications are again seen. Interval thoracentesis has been performed on the right with resolution of the right-sided pleural effusion. No sizable pneumothorax is seen. No acute bony abnormality is noted. IMPRESSION: No pneumothorax following right-sided thoracentesis. Electronically Signed   By: Inez Catalina M.D.   On: 07/19/2019 15:37   DG Chest 2 View  Result Date: 07/18/2019 CLINICAL DATA:  Lethargy. Dizziness and nausea. EXAM: CHEST - 2 VIEW COMPARISON:  Chest CT 03/28/2017 FINDINGS: Moderate right pleural effusion with fluid tracking laterally and in the fissure. Mild cardiomegaly. Aortic atherosclerosis and tortuosity. Vascular congestion without pulmonary edema. No left pleural effusion. No pneumothorax. No acute osseous abnormalities are seen. IMPRESSION: 1. Moderate right pleural effusion with fluid tracking laterally and in the fissure. 2. Mild cardiomegaly with vascular congestion. Electronically Signed   By: Keith Rake M.D.   On: 07/18/2019 18:44   US RENAL  Result Date: 07/18/2019 CLINICAL DATA:  Hematuria EXAM: RENAL / URINARY TRACT ULTRASOUND COMPLETE COMPARISON:  Chest x-ray from earlier in the same day. FINDINGS: Right Kidney: Renal measurements: 11.3 x 4.6 x 5.3 cm. = volume: 145 mL . Echogenicity within normal limits. No mass or hydronephrosis visualized. Left Kidney: Renal measurements: 10.1 x 5.9 x 5.2 cm. = volume: 161 mL. Echogenicity within normal limits. No mass or hydronephrosis visualized. Bladder: Bladder is decompressed. Echogenic material is noted within which may be related to thrombus or focal mass. Given the history of hematuria this likely represents thrombus. Other: Mild ascites is noted. Large right-sided pleural effusion is seen. Cirrhotic changes of liver are noted.  IMPRESSION: Normal appearing kidneys bilaterally. Echogenic material within the bladder which is partially distended. Given the patient's history of hematuria this likely represents thrombus. Direct visualization may be helpful. Mild ascites and large right pleural effusion. Cirrhotic change of the liver is noted. Electronically Signed   By: Inez Catalina M.D.   On: 07/18/2019 21:24   IR THORACENTESIS ASP PLEURAL SPACE W/IMG GUIDE  Result Date: 07/19/2019 INDICATION: Cirrhosis secondary to non alcoholic fatty liver disease. Right pleural effusion. Request for diagnostic and therapeutic thoracentesis. EXAM: ULTRASOUND GUIDED RIGHT THORACENTESIS MEDICATIONS: 1% lidocaine 10 COMPLICATIONS: None immediate. PROCEDURE: An ultrasound guided thoracentesis was thoroughly discussed with the patient and questions answered. The benefits, risks, alternatives and complications were also discussed. The patient understands and wishes to proceed with the procedure. Written consent was obtained. Ultrasound was performed to localize and mark an adequate pocket of fluid in the right chest. The area was then prepped and draped in the normal sterile fashion. 1% Lidocaine was used for local anesthesia. Under ultrasound guidance a 6 Fr Safe-T-Centesis catheter was introduced. Thoracentesis was performed. The catheter was removed and a dressing applied. FINDINGS: A total of approximately 1.2 L of clear orange fluid was removed. Samples were sent to the laboratory as requested by the clinical team. IMPRESSION: Successful ultrasound guided right thoracentesis yielding 1.2 L of pleural fluid. Read by: Gareth Eagle, PA-C Electronically Signed   By: Corrie Mckusick D.O.   On: 07/19/2019 15:06     Scheduled Meds: . ALPRAZolam  0.25 mg Oral BID  . atenolol  50 mg Oral Daily  . atorvastatin  80 mg Oral Q2000  . bumetanide  1 mg Oral Daily  . buPROPion  150 mg Oral Daily  . Chlorhexidine Gluconate Cloth  6 each Topical Daily  . docusate  sodium  100 mg Oral BID  . escitalopram  20 mg Oral Daily  . ferrous sulfate  325 mg Oral BID WC  . fluticasone  1 spray Each Nare Daily  . furosemide  20 mg Intravenous Daily  . gabapentin  300 mg Oral BID  . Gerhardt's butt cream   Topical QID  . insulin aspart  0-15 Units Subcutaneous TID WC  . insulin aspart  0-5 Units Subcutaneous QHS  . lactulose  30 g Oral BID  . linagliptin  5 mg Oral Daily  . loratadine  10 mg Oral Daily  . mesalamine  1,000 mg Rectal QHS  . metFORMIN  500 mg Oral BID WC  . rifaximin  550 mg Oral BID  . spironolactone  100 mg Oral Daily   Continuous Infusions: . sodium chloride irrigation       LOS: 16 days   Time Spent in minutes   45 minutes  Akhila Mahnken D.O. on 07/20/2019 at 10:47 AM  Between 7am to 7pm - Please see pager noted on [email or web address]  After 7pm go to [email or web address]  And look for the night coverage person covering for me after hours  [facility] Office  [phone]

## 2019-07-20 NOTE — Progress Notes (Signed)
Patient complaining of bladder pain and discomfort. States "it feels like I constantly have to go to the bathroom." Patient given PRN pain medication. The nurse tech reported yellowish discharge at catheter site while completing foley care. Linna Hoff, PA made aware of pain and discharge. Urology consulted to see patient later today.

## 2019-07-20 NOTE — Progress Notes (Signed)
Brittany Russo PHYSICAL MEDICINE & REHABILITATION PROGRESS NOTE  Subjective/Complaints: Patient seen laying in bed this AM.  She states she slept fairly overnight, but does not feel well and is tired this AM.  She cannot identify a particular cause. She was seen by IM, GI, Urology, VIR yesterday - notes reviewed, lasix, spiranolactone, CBI, and thoracentesis respectively.   ROS: Denies CP, SOB, N/V/D  Objective: Vital Signs: Blood pressure 123/61, pulse 90, temperature 98.4 F (36.9 C), temperature source Oral, resp. rate 18, height 5' 5"  (1.651 m), weight 73.6 kg, SpO2 96 %. DG Chest 1 View  Result Date: 07/19/2019 CLINICAL DATA:  Status post right thoracentesis EXAM: CHEST  1 VIEW COMPARISON:  07/18/2019 FINDINGS: Cardiac shadow remains enlarged. Aortic calcifications are again seen. Interval thoracentesis has been performed on the right with resolution of the right-sided pleural effusion. No sizable pneumothorax is seen. No acute bony abnormality is noted. IMPRESSION: No pneumothorax following right-sided thoracentesis. Electronically Signed   By: Inez Catalina M.D.   On: 07/19/2019 15:37   DG Chest 2 View  Result Date: 07/18/2019 CLINICAL DATA:  Lethargy. Dizziness and nausea. EXAM: CHEST - 2 VIEW COMPARISON:  Chest CT 03/28/2017 FINDINGS: Moderate right pleural effusion with fluid tracking laterally and in the fissure. Mild cardiomegaly. Aortic atherosclerosis and tortuosity. Vascular congestion without pulmonary edema. No left pleural effusion. No pneumothorax. No acute osseous abnormalities are seen. IMPRESSION: 1. Moderate right pleural effusion with fluid tracking laterally and in the fissure. 2. Mild cardiomegaly with vascular congestion. Electronically Signed   By: Keith Rake M.D.   On: 07/18/2019 18:44   US RENAL  Result Date: 07/18/2019 CLINICAL DATA:  Hematuria EXAM: RENAL / URINARY TRACT ULTRASOUND COMPLETE COMPARISON:  Chest x-ray from earlier in the same day. FINDINGS: Right  Kidney: Renal measurements: 11.3 x 4.6 x 5.3 cm. = volume: 145 mL . Echogenicity within normal limits. No mass or hydronephrosis visualized. Left Kidney: Renal measurements: 10.1 x 5.9 x 5.2 cm. = volume: 161 mL. Echogenicity within normal limits. No mass or hydronephrosis visualized. Bladder: Bladder is decompressed. Echogenic material is noted within which may be related to thrombus or focal mass. Given the history of hematuria this likely represents thrombus. Other: Mild ascites is noted. Large right-sided pleural effusion is seen. Cirrhotic changes of liver are noted. IMPRESSION: Normal appearing kidneys bilaterally. Echogenic material within the bladder which is partially distended. Given the patient's history of hematuria this likely represents thrombus. Direct visualization may be helpful. Mild ascites and large right pleural effusion. Cirrhotic change of the liver is noted. Electronically Signed   By: Inez Catalina M.D.   On: 07/18/2019 21:24   IR THORACENTESIS ASP PLEURAL SPACE W/IMG GUIDE  Result Date: 07/19/2019 INDICATION: Cirrhosis secondary to non alcoholic fatty liver disease. Right pleural effusion. Request for diagnostic and therapeutic thoracentesis. EXAM: ULTRASOUND GUIDED RIGHT THORACENTESIS MEDICATIONS: 1% lidocaine 10 COMPLICATIONS: None immediate. PROCEDURE: An ultrasound guided thoracentesis was thoroughly discussed with the patient and questions answered. The benefits, risks, alternatives and complications were also discussed. The patient understands and wishes to proceed with the procedure. Written consent was obtained. Ultrasound was performed to localize and mark an adequate pocket of fluid in the right chest. The area was then prepped and draped in the normal sterile fashion. 1% Lidocaine was used for local anesthesia. Under ultrasound guidance a 6 Fr Safe-T-Centesis catheter was introduced. Thoracentesis was performed. The catheter was removed and a dressing applied. FINDINGS: A  total of approximately 1.2 L of clear orange fluid  was removed. Samples were sent to the laboratory as requested by the clinical team. IMPRESSION: Successful ultrasound guided right thoracentesis yielding 1.2 L of pleural fluid. Read by: Gareth Eagle, PA-C Electronically Signed   By: Corrie Mckusick D.O.   On: 07/19/2019 15:06   Recent Labs    07/19/19 0709 07/20/19 0542  WBC 7.0 7.3  HGB 8.2* 8.6*  HCT 26.5* 27.5*  PLT 111* 90*   Recent Labs    07/18/19 1442 07/20/19 0542  NA 131* 131*  K 4.6 4.3  CL 98 99  CO2 21* 24  GLUCOSE 136* 159*  BUN 19 15  CREATININE 0.99 0.82  CALCIUM 8.4* 8.3*    Physical Exam: BP 123/61   Pulse 90   Temp 98.4 F (36.9 C) (Oral)   Resp 18   Ht 5' 5"  (1.651 m)   Wt 73.6 kg   SpO2 96%   BMI 27.00 kg/m   Constitutional: No distress . Vital signs reviewed. HENT: Normocephalic.  Atraumatic. Eyes: EOMI. No discharge. Cardiovascular: No JVD. Respiratory: Normal effort.  No stridor. GI: Non-distended. Skin: Right AKA with dressing C/D/I Psych: Flat. Musc: B/l LE edema and tenderness. Neuro: Alert Motor:  Left lower extremity: Hip flexion, knee extension 4/5, ankle dorsiflexion tight heelcord, but able to dorsiflex, 4-/5 ADF Right lower extremity: Hip flexion 4/5 (some pain inhibition), unchanged  Assessment/Plan: 1. Functional deficits secondary to right AKA which require 3+ hours per day of interdisciplinary therapy in a comprehensive inpatient rehab setting.  Physiatrist is providing close team supervision and 24 hour management of active medical problems listed below.  Physiatrist and rehab team continue to assess barriers to discharge/monitor patient progress toward functional and medical goals  Care Tool:  Bathing    Body parts bathed by patient: Right arm, Left arm, Chest, Abdomen, Front perineal area, Right upper leg, Left upper leg, Face, Buttocks   Body parts bathed by helper: Buttocks, Left upper leg Body parts n/a: Right  lower leg   Bathing assist Assist Level: Minimal Assistance - Patient > 75%     Upper Body Dressing/Undressing Upper body dressing   What is the patient wearing?: Pull over shirt    Upper body assist Assist Level: Set up assist    Lower Body Dressing/Undressing Lower body dressing      What is the patient wearing?: Pants     Lower body assist Assist for lower body dressing: Moderate Assistance - Patient 50 - 74%     Toileting Toileting    Toileting assist Assist for toileting: Moderate Assistance - Patient 50 - 74% Assistive Device Comment: BSC in bathroom   Transfers Chair/bed transfer  Transfers assist  Chair/bed transfer activity did not occur: Safety/medical concerns  Chair/bed transfer assist level: Contact Guard/Touching assist(slideboard)     Locomotion Ambulation   Ambulation assist   Ambulation activity did not occur: Safety/medical concerns(R AKA, L LE and generalized weakness, decreased balance/postural control, OA, and osteoperosis)          Walk 10 feet activity   Assist  Walk 10 feet activity did not occur: Safety/medical concerns(R AKA, L LE and generalized weakness, decreased balance/postural control, OA, and osteoperosis)        Walk 50 feet activity   Assist Walk 50 feet with 2 turns activity did not occur: Safety/medical concerns(R AKA, L LE and generalized weakness, decreased balance/postural control, OA, and osteoperosis)         Walk 150 feet activity   Assist Walk 150 feet activity  did not occur: Safety/medical concerns(R AKA, L LE and generalized weakness, decreased balance/postural control, OA, and osteoperosis)         Walk 10 feet on uneven surface  activity   Assist Walk 10 feet on uneven surfaces activity did not occur: Safety/medical concerns(R AKA, L LE and generalized weakness, decreased balance/postural control, OA, and osteoperosis)         Wheelchair     Assist Will patient use wheelchair at  discharge?: Yes Type of Wheelchair: Manual    Wheelchair assist level: Independent Max wheelchair distance: 168f    Wheelchair 50 feet with 2 turns activity    Assist        Assist Level: Independent   Wheelchair 150 feet activity     Assist     Assist Level: Independent      Medical Problem List and Plan: 1.  Impaired function, ADLs and mobility secondary to R AKA due to failed TKRs  Will ask therapy to reeval patient, discussed with therapies.  2.  Antithrombotics: -DVT/anticoagulation:  Cannot do lovenox due to low platelets             -antiplatelet therapy: N/A 3. Pain Management: Tramadol 50 mg qid with robaxin/Hydrocodone prn  Phantom limb pain    Gabapentin 100 BID started on 4/13, changed to 300 mg nightly on 4/15, changed to twice daily on 4/19, tolerating  Controlled with meds on 4/29 4. Mood: LCSW to follow for evaluation and support.              -antipsychotic agents: N/A 5. Neuropsych: This patient is ?fully capable of making decisions on her own behalf.  Discussed admission with neuropsych, appreciate evaluation and recs 6. Skin/Wound Care: Monitor wound for healing once surgical dressing removed.   ABD pad, with Kerlix and Ace dressings to AKA. 7. Fluids/Electrolytes/Nutrition: Monitor I/Os 8. Cirrhosis of the liver:   Appreciate GI recs, appreciate follow up  IV Lasix 20 mg daily, increased to 40 mg due to increasing edema 9. ABLA:   Hemoglobin 8.6 on 4/29   S/p transfusion on 4/27  Flex/Sig in 2-3 weeks  Continue to monitor 10. Chronic Thrombocytopenia: Monitor for signs of bleeding. Baseline at 74-->51.   Platelets 90 on 4/29  See #8  Continue to monitor 11. Chronic hyponatremia: Continue to monitor.   Sodium 131 on 4/29  Continue to monitor 12. T2DM with hyperglycemia: Hgb A1c-5.2.   Glucotrol 2.5 twice daily, discussed with pharmacy DC on 4/23  Continue Tradjenta 5 mg daily  Continue metformin   Lantus 20 units daily,  decreased to 15 units on 4/18, discussed with pharmacy, DHayeson 4/23  Labile and elevated on 4/29 CBG (last 3)  Recent Labs    07/19/19 1712 07/19/19 2104 07/20/19 0616  GLUCAP 188* 157* 158*  13. H/o depression: ON Lexapro, Xanax and Wellbutrin. 14.  Labile blood pressure  Relatively controlled on 4/28 15.  Radiation cystitis-   Appreciate Urology recs - Cont CBI 16. Acute lower UTI  UA +, Ucx >100K Citrobacter  Continue Macrobid through 4/28  Repeat Ucx with multiple species - no value in recollection given CBI 17. Supratherapeutic INR  INR 2.6 on 4/28, labs pending  After Vit K and FFP on 4/28 18. Pleural effusion  Seen on CXR, personally reviewed, repeat ordered  S/p thoracentesis  Cultures pending   LOS: 16 days A FACE TO FACE EVALUATION WAS PERFORMED  Brittany Russo ALorie Phenix4/29/2021, 9:41 AM

## 2019-07-21 ENCOUNTER — Inpatient Hospital Stay (HOSPITAL_COMMUNITY): Payer: Medicare Other

## 2019-07-21 DIAGNOSIS — I10 Essential (primary) hypertension: Secondary | ICD-10-CM | POA: Diagnosis not present

## 2019-07-21 DIAGNOSIS — E7849 Other hyperlipidemia: Secondary | ICD-10-CM | POA: Diagnosis not present

## 2019-07-21 HISTORY — PX: IR PARACENTESIS: IMG2679

## 2019-07-21 LAB — BASIC METABOLIC PANEL
Anion gap: 10 (ref 5–15)
BUN: 18 mg/dL (ref 8–23)
CO2: 24 mmol/L (ref 22–32)
Calcium: 8.3 mg/dL — ABNORMAL LOW (ref 8.9–10.3)
Chloride: 97 mmol/L — ABNORMAL LOW (ref 98–111)
Creatinine, Ser: 0.84 mg/dL (ref 0.44–1.00)
GFR calc Af Amer: >60 mL/min (ref >60)
GFR calc non Af Amer: >60 mL/min (ref >60)
Glucose, Bld: 168 mg/dL — ABNORMAL HIGH (ref 70–99)
Potassium: 4.1 mmol/L (ref 3.5–5.1)
Sodium: 131 mmol/L — ABNORMAL LOW (ref 135–145)

## 2019-07-21 LAB — URINALYSIS, COMPLETE (UACMP) WITH MICROSCOPIC
Bilirubin Urine: NEGATIVE
Glucose, UA: NEGATIVE mg/dL
Ketones, ur: NEGATIVE mg/dL
Nitrite: POSITIVE — AB
Protein, ur: 100 mg/dL — AB
RBC / HPF: >50 RBC/hpf — ABNORMAL HIGH (ref 0–5)
Specific Gravity, Urine: 1.024 (ref 1.005–1.030)
WBC, UA: >50 WBC/hpf — ABNORMAL HIGH (ref 0–5)
pH: 6 (ref 5.0–8.0)

## 2019-07-21 LAB — CBC
HCT: 27.8 % — ABNORMAL LOW (ref 36.0–46.0)
Hemoglobin: 8.8 g/dL — ABNORMAL LOW (ref 12.0–15.0)
MCH: 30.1 pg (ref 26.0–34.0)
MCHC: 31.7 g/dL (ref 30.0–36.0)
MCV: 95.2 fL (ref 80.0–100.0)
Platelets: 99 10*3/uL — ABNORMAL LOW (ref 150–400)
RBC: 2.92 MIL/uL — ABNORMAL LOW (ref 3.87–5.11)
RDW: 18.7 % — ABNORMAL HIGH (ref 11.5–15.5)
WBC: 9.3 10*3/uL (ref 4.0–10.5)
nRBC: 0 % (ref 0.0–0.2)

## 2019-07-21 LAB — PROTIME-INR
INR: 1.5 — ABNORMAL HIGH (ref 0.8–1.2)
Prothrombin Time: 17.8 seconds — ABNORMAL HIGH (ref 11.4–15.2)

## 2019-07-21 LAB — GLUCOSE, CAPILLARY
Glucose-Capillary: 198 mg/dL — ABNORMAL HIGH (ref 70–99)
Glucose-Capillary: 193 mg/dL — ABNORMAL HIGH (ref 70–99)
Glucose-Capillary: 148 mg/dL — ABNORMAL HIGH (ref 70–99)
Glucose-Capillary: 168 mg/dL — ABNORMAL HIGH (ref 70–99)

## 2019-07-21 LAB — URINE CULTURE

## 2019-07-21 MED ORDER — ALBUMIN HUMAN 25 % IV SOLN
12.5000 g | Freq: Once | INTRAVENOUS | Status: AC
  Administered 2019-07-21: 12.5 g via INTRAVENOUS
  Filled 2019-07-21 (×2): qty 50

## 2019-07-21 MED ORDER — LIDOCAINE HCL 1 % IJ SOLN
INTRAMUSCULAR | Status: DC | PRN
  Administered 2019-07-21: 10 mL

## 2019-07-21 MED ORDER — LIDOCAINE HCL 1 % IJ SOLN
INTRAMUSCULAR | Status: AC
  Filled 2019-07-21: qty 20

## 2019-07-21 MED ORDER — LACTULOSE 10 GM/15ML PO SOLN
10.0000 g | Freq: Two times a day (BID) | ORAL | Status: DC
  Administered 2019-07-22 – 2019-07-23 (×3): 10 g via ORAL
  Filled 2019-07-21 (×3): qty 15

## 2019-07-21 NOTE — Progress Notes (Signed)
PROGRESS NOTE    Brittany Russo  LPF:[id number] DOB: 1946-03-09 DOA: 07/04/2019 PCP: Rosalee Kaufman, PA-C   Brief Narrative:  Consult for coagulopathy and hematuria. Have consulted urology and order foley catheter to placed.  Assessment & Plan   Coagulopathy/Elevated INR -INR was as high as 5.4 -Suspect secondary to impaired hepatic function given that patient has had a history of liver cirrhosis -Patient was given FFP as well as vitamin K -INR has improved to 1.5 from 5.4 -pending INR today  Hematuria/UTI -Patient was had a history of endometrial cancer status post chemotherapy and radiation -Question if this is due to radiation cystitis -Reports having had frequent urinary tract infections which were treated with antibiotics -Overnight per nursing report, patient did have in and out cath and was noted to have passage of blood clots -Urology consulted and appreciated.  Discussed with Dr. Matilde Sprang, who recommended placing a 20 French Foley catheter and hand irrigation. -Ultrasound showed normal-appearing kidneys bilaterally.  Echogenic material within the bladder, which is partially distended-likely represents thrombus.  Mild ascites and large right pleural effusion.  Cirrhotic change of the liver noted. -CBI on 4/28, urine continues to be clear -UA showed: >50 WBC, Few bacteria, large leukocytes -Urine culture pending  Acute on chronic iron deficiency anemia -was transfused 1u PRBCs on 4/27 -hemoglobin up to 8.8 -continue to monitor CBC -Mesalamine added by gastroenterology-recommended flexible sigmoidoscopy in 2 to 3 weeks  NASH with cirrhosis, decompensated -Continue atenolol, lactulose, rifaximin -started IV lasix 68m daily on 4/28 -Gastroenterology consulted and appreciated, recommended Aldactone 100 mg daily along with Lasix 20 mg daily. -Patient appears to have distended abdomen -Ultrasound paracentesis and albumin ordered  Hypervolemia with pleural  effusion/acute on chronic diastolic heart failure -likely secondary to cirrhosis -Echocardiogram July 2020 showed EF 653-29% LV diastolic Doppler parameters consistent with impaired relaxation -Status post thoracentesis yielding 1.1 L of fluid -Patient has been started on diuretics including IV Lasix as well as spironolactone -Monitor intake and output, daily weights  Right knee AKA -Continue therapy  Hyponatremia -possibly related to cirrhosis -continue BMP  Depression and anxiety -Continue lexapro and xanax  Diabetes mellitus, type II -Continue metformin, Tradjenta, insulin sliding scale with CBG monitoring  DVT Prophylaxis currently with anemia,  SCDs  Code Status: Full  Family Communication: None at bedside  Disposition Plan: Per primary.  [facility]  Procedures  Right AKA Renal ultrasound  Antibiotics   Anti-infectives (From admission, onward)   Start     Dose/Rate Route Frequency Ordered Stop   07/12/19 1000  nitrofurantoin (macrocrystal-monohydrate) (MACROBID) capsule 100 mg     100 mg Oral Every 12 hours 07/12/19 0839 07/18/19 2211   07/04/19 2000  rifaximin (XIFAXAN) tablet 550 mg     550 mg Oral 2 times daily 07/04/19 1401        Subjective:   [name].  Patient states she feels tired and weak today.  Denies current chest pain or shortness of breath.  Does not like using lactulose.  States she does not feel well but cannot be more specific.  She denies abdominal pain, nausea or vomiting, dizziness or headache.    Objective:   Vitals:   07/20/19 1643 07/20/19 1956 07/21/19 0458 07/21/19 0525  BP:  (!) 133/55 (!) 148/60   Pulse:  86 90   Resp:  17 16   Temp:  97.7 F (36.5 C) 99.1 F (37.3 C)   TempSrc:   Oral   SpO2:  100% 95%   Weight: 69.9 kg   69.8 kg  Height:        Intake/Output Summary (Last 24 hours) at 07/21/2019 0933 Last data filed at 07/21/2019 0800 Gross per 24 hour  Intake 230 ml  Output  3900 ml  Net -3670 ml   Filed Weights   07/05/19 0700 07/20/19 1643 07/21/19 0525  Weight: 73.6 kg 69.9 kg 69.8 kg   Exam  General: Well developed, chronically ill-appearing, NAD  HEENT: NCAT, mucous membranes moist.   Cardiovascular: S1 S2 auscultated, soft SEM, RRR  Respiratory: Clear to auscultation bilaterally   Abdomen: Soft, nontender, distended, + bowel sounds  Extremities: Right AKA.  L LE edema 2+  Neuro: AAOx3, nonfocal  Psych: Appropriate mood and affect.  Question her judgment and insight into her medical problems  Data Reviewed: I have personally reviewed following labs and imaging studies  CBC: Recent Labs  Lab 07/15/19 0855 07/17/19 0609 07/18/19 0606 07/18/19 1803 07/19/19 0709 07/20/19 0542 07/21/19 0456  WBC 5.3   < > 8.5 7.6 7.0 7.3 9.3  NEUTROABS 3.7  --  5.1  --  4.6 5.3  --   HGB 7.8*   < > 7.7* 6.8* 8.2* 8.6* 8.8*  HCT 25.2*   < > 25.8* 22.3* 26.5* 27.5* 27.8*  MCV 93.7   < > 98.9 95.3 95.3 93.5 95.2  PLT 99*   < > 134* 123* 111* 90* 99*   < > = values in this interval not displayed.   Basic Metabolic Panel: Recent Labs  Lab 07/17/19 0609 07/18/19 1442 07/20/19 0542 07/21/19 0456  NA 130* 131* 131* 131*  K 4.4 4.6 4.3 4.1  CL 95* 98 99 97*  CO2 26 21* 24 24  GLUCOSE 147* 136* 159* 168*  BUN 13 19 15 18   CREATININE 0.87 0.99 0.82 0.84  CALCIUM 8.2* 8.4* 8.3* 8.3*   GFR: Estimated Creatinine Clearance: 60.2 mL/min (by C-G formula based on SCr of 0.84 mg/dL). Liver Function Tests: Recent Labs  Lab 07/18/19 1442 07/20/19 0542  AST 61* 62*  ALT 53* 56*  ALKPHOS 127* 140*  BILITOT 1.9* 2.0*  PROT 4.9* 5.1*  ALBUMIN 2.1* 2.1*   No results for input(s): LIPASE, AMYLASE in the last 168 hours. Recent Labs  Lab 07/18/19 1423  AMMONIA 34   Coagulation Profile: Recent Labs  Lab 07/18/19 1442 07/18/19 1803 07/19/19 0709 07/20/19 1053 07/21/19 0456  INR 4.9* 5.4* 2.6* 1.4* 1.5*   Cardiac Enzymes: No results for  input(s): CKTOTAL, CKMB, CKMBINDEX, TROPONINI in the last 168 hours. BNP (last 3 results) No results for input(s): PROBNP in the last 8760 hours. HbA1C: No results for input(s): HGBA1C in the last 72 hours. CBG: Recent Labs  Lab 07/20/19 0616 07/20/19 1129 07/20/19 1630 07/20/19 2105 07/21/19 0648  GLUCAP 158* 167* 179* 168* 198*   Lipid Profile: No results for input(s): CHOL, HDL, LDLCALC, TRIG, CHOLHDL, LDLDIRECT in the last 72 hours. Thyroid Function Tests: No results for input(s): TSH, T4TOTAL, FREET4, T3FREE, THYROIDAB in the last 72 hours. Anemia Panel: No results for input(s): VITAMINB12, FOLATE, FERRITIN, TIBC, IRON, RETICCTPCT in the last 72 hours. Urine analysis:    Component Value Date/Time   COLORURINE YELLOW 07/20/2019 1630   APPEARANCEUR CLOUDY (A) 07/20/2019 1630   LABSPEC 1.015 07/20/2019 1630   PHURINE 5.0 07/20/2019 1630   GLUCOSEU NEGATIVE 07/20/2019 1630   HGBUR LARGE (A) 07/20/2019 1630   BILIRUBINUR NEGATIVE 07/20/2019 1630   BILIRUBINUR NEGATIVE 03/06/2013 1100  KETONESUR NEGATIVE 07/20/2019 1630   PROTEINUR 100 (A) 07/20/2019 1630   UROBILINOGEN negative 03/06/2013 1100   NITRITE NEGATIVE 07/20/2019 1630   LEUKOCYTESUR LARGE (A) 07/20/2019 1630   Sepsis Labs: @LABRCNTIP (procalcitonin:4,lacticidven:4)  ) Recent Results (from the past 240 hour(s))  Culture, Urine     Status: Abnormal   Collection Time: 07/11/19  2:11 PM   Specimen: Urine, Random  Result Value Ref Range Status   Specimen Description URINE, RANDOM  Final   Special Requests   Final    NONE Performed at [facility], [place] [address] [place], [place] [address]    Culture >=100,000 COLONIES/mL CITROBACTER FREUNDII (A)  Final   Report Status 07/14/2019 FINAL  Final   Organism ID, Bacteria CITROBACTER FREUNDII (A)  Final      Susceptibility   Citrobacter freundii - MIC*    CEFAZOLIN >=64 RESISTANT Resistant     CEFTRIAXONE <=0.25 SENSITIVE Sensitive     CIPROFLOXACIN  <=0.25 SENSITIVE Sensitive     GENTAMICIN <=1 SENSITIVE Sensitive     IMIPENEM 1 SENSITIVE Sensitive     NITROFURANTOIN <=16 SENSITIVE Sensitive     TRIMETH/SULFA <=20 SENSITIVE Sensitive     PIP/TAZO <=4 SENSITIVE Sensitive     * >=100,000 COLONIES/mL CITROBACTER FREUNDII  Culture, Urine     Status: Abnormal   Collection Time: 07/18/19  7:12 PM   Specimen: Urine, Random  Result Value Ref Range Status   Specimen Description URINE, RANDOM  Final   Special Requests   Final    NONE Performed at [facility], [place] [address] [place], [place] [address]    Culture MULTIPLE SPECIES PRESENT, SUGGEST RECOLLECTION (A)  Final   Report Status 07/19/2019 FINAL  Final  Gram stain     Status: None   Collection Time: 07/19/19  3:22 PM   Specimen: Lung, Right; Pleural Fluid  Result Value Ref Range Status   Specimen Description FLUID RIGHT PLEURAL  Final   Special Requests NONE  Final   Gram Stain   Final    MODERATE WBC PRESENT, PREDOMINANTLY MONONUCLEAR NO ORGANISMS SEEN Performed at [facility], [place] [address] [place], [place] [address]    Report Status 07/19/2019 FINAL  Final  Culture, body fluid-bottle     Status: None (Preliminary result)   Collection Time: 07/19/19  3:22 PM   Specimen: Fluid  Result Value Ref Range Status   Specimen Description FLUID RIGHT PLEURAL  Final   Special Requests BOTTLES DRAWN AEROBIC AND ANAEROBIC 10CC  Final   Culture   Final    NO GROWTH < 24 HOURS Performed at [facility], [place] [address], [place],  [address]    Report Status PENDING  Incomplete      Radiology Studies: DG Chest 1 View  Result Date: 07/19/2019 CLINICAL DATA:  Status post right thoracentesis EXAM: CHEST  1 VIEW COMPARISON:  07/18/2019 FINDINGS: Cardiac shadow remains enlarged. Aortic calcifications are again seen. Interval thoracentesis has been performed on the right with resolution of the right-sided pleural effusion. No sizable pneumothorax is seen. No  acute bony abnormality is noted. IMPRESSION: No pneumothorax following right-sided thoracentesis. Electronically Signed   By: Inez Catalina M.D.   On: 07/19/2019 15:37   DG Chest 2 View  Result Date: 07/20/2019 CLINICAL DATA:  Pleural effusion. EXAM: CHEST - 2 VIEW COMPARISON:  July 19, 2019. FINDINGS: Stable cardiomediastinal silhouette. No pneumothorax or pleural effusion is noted. Atherosclerosis of thoracic aorta is noted. No  acute pulmonary disease is noted. Bony thorax is unremarkable. IMPRESSION: No active cardiopulmonary disease. Aortic Atherosclerosis (ICD10-I70.0). Electronically Signed   By: Marijo Conception M.D.   On: 07/20/2019 12:37   IR THORACENTESIS ASP PLEURAL SPACE W/IMG GUIDE  Result Date: 07/19/2019 INDICATION: Cirrhosis secondary to non alcoholic fatty liver disease. Right pleural effusion. Request for diagnostic and therapeutic thoracentesis. EXAM: ULTRASOUND GUIDED RIGHT THORACENTESIS MEDICATIONS: 1% lidocaine 10 COMPLICATIONS: None immediate. PROCEDURE: An ultrasound guided thoracentesis was thoroughly discussed with the patient and questions answered. The benefits, risks, alternatives and complications were also discussed. The patient understands and wishes to proceed with the procedure. Written consent was obtained. Ultrasound was performed to localize and mark an adequate pocket of fluid in the right chest. The area was then prepped and draped in the normal sterile fashion. 1% Lidocaine was used for local anesthesia. Under ultrasound guidance a 6 Fr Safe-T-Centesis catheter was introduced. Thoracentesis was performed. The catheter was removed and a dressing applied. FINDINGS: A total of approximately 1.2 L of clear orange fluid was removed. Samples were sent to the laboratory as requested by the clinical team. IMPRESSION: Successful ultrasound guided right thoracentesis yielding 1.2 L of pleural fluid. Read by: Gareth Eagle, PA-C Electronically Signed   By: Corrie Mckusick D.O.   On:  07/19/2019 15:06     Scheduled Meds: . ALPRAZolam  0.25 mg Oral BID  . atenolol  50 mg Oral Daily  . atorvastatin  80 mg Oral Q2000  . buPROPion  150 mg Oral Daily  . Chlorhexidine Gluconate Cloth  6 each Topical Daily  . docusate sodium  100 mg Oral BID  . escitalopram  20 mg Oral Daily  . ferrous sulfate  325 mg Oral BID WC  . fluticasone  1 spray Each Nare Daily  . furosemide  40 mg Intravenous Daily  . gabapentin  300 mg Oral BID  . Gerhardt's butt cream   Topical QID  . insulin aspart  0-15 Units Subcutaneous TID WC  . insulin aspart  0-5 Units Subcutaneous QHS  . lactulose  30 g Oral BID  . linagliptin  5 mg Oral Daily  . loratadine  10 mg Oral Daily  . mesalamine  1,000 mg Rectal QHS  . metFORMIN  500 mg Oral BID WC  . rifaximin  550 mg Oral BID  . spironolactone  100 mg Oral Daily   Continuous Infusions: . albumin human       LOS: 17 days   Time Spent in minutes   45 minutes  Kerrilynn Derenzo D.O. on 07/21/2019 at 9:33 AM  Between 7am to 7pm - Please see pager noted on [email or web address]  After 7pm go to [email or web address]  And look for the night coverage person covering for me after hours  [facility] Office  [phone]

## 2019-07-21 NOTE — Plan of Care (Signed)
  Problem: Consults Goal: RH LIMB LOSS PATIENT EDUCATION Description: Description: See Patient Education module for eduction specifics. Outcome: Progressing Goal: Skin Care Protocol Initiated - if Braden Score 18 or less Description: If consults are not indicated, leave blank or document N/A Outcome: Progressing Goal: Diabetes Guidelines if Diabetic/Glucose > 140 Description: If diabetic or lab glucose is > 140 mg/dl - Initiate Diabetes/Hyperglycemia Guidelines & Document Interventions  Outcome: Progressing   Problem: RH SKIN INTEGRITY Goal: RH STG SKIN FREE OF INFECTION/BREAKDOWN Description: Patients skin will remain free from further infection or breakdown with Min assist. Outcome: Progressing Goal: RH STG MAINTAIN SKIN INTEGRITY WITH ASSISTANCE Description: STG Maintain Skin Integrity With min Assistance. Outcome: Progressing Goal: RH STG ABLE TO PERFORM INCISION/WOUND CARE W/ASSISTANCE Description: STG Able To Perform Incision/Wound Care With total Assistance from caregiver. Outcome: Progressing   Problem: RH SAFETY Goal: RH STG ADHERE TO SAFETY PRECAUTIONS W/ASSISTANCE/DEVICE Description: STG Adhere to Safety Precautions With mod I  Assistance/Device. Outcome: Progressing   Problem: RH PAIN MANAGEMENT Goal: RH STG PAIN MANAGED AT OR BELOW PT'S PAIN GOAL Description: < 4 Outcome: Progressing   Problem: RH KNOWLEDGE DEFICIT LIMB LOSS Goal: RH STG INCREASE KNOWLEDGE OF SELF CARE AFTER LIMB LOSS Description: Patient/caregiver will verbalize understanding of treatment, monitoring, medications, and follow up care of her new amputation with min assist. Outcome: Progressing

## 2019-07-21 NOTE — Progress Notes (Signed)
Patient has had multiple loose stools for past 24 hours--will decrease lactulose back to 10 mg bid.

## 2019-07-21 NOTE — Progress Notes (Addendum)
[place] PHYSICAL MEDICINE & REHABILITATION PROGRESS NOTE  Subjective/Complaints: Patient seen laying in bed this AM.  She states she slept well overnight, but states she is tired and "just want to go home". She was seen by hospitalist, notes reviewed, cont with current meds.  ROS: Denies CP, SOB, N/V/D  Objective: Vital Signs: Blood pressure (!) 148/60, pulse 90, temperature 99.1 F (37.3 C), temperature source Oral, resp. rate 16, height 5' 5"  (1.651 m), weight 69.8 kg, SpO2 95 %. DG Chest 1 View  Result Date: 07/19/2019 CLINICAL DATA:  Status post right thoracentesis EXAM: CHEST  1 VIEW COMPARISON:  07/18/2019 FINDINGS: Cardiac shadow remains enlarged. Aortic calcifications are again seen. Interval thoracentesis has been performed on the right with resolution of the right-sided pleural effusion. No sizable pneumothorax is seen. No acute bony abnormality is noted. IMPRESSION: No pneumothorax following right-sided thoracentesis. Electronically Signed   By: Inez Catalina M.D.   On: 07/19/2019 15:37   DG Chest 2 View  Result Date: 07/20/2019 CLINICAL DATA:  Pleural effusion. EXAM: CHEST - 2 VIEW COMPARISON:  July 19, 2019. FINDINGS: Stable cardiomediastinal silhouette. No pneumothorax or pleural effusion is noted. Atherosclerosis of thoracic aorta is noted. No acute pulmonary disease is noted. Bony thorax is unremarkable. IMPRESSION: No active cardiopulmonary disease. Aortic Atherosclerosis (ICD10-I70.0). Electronically Signed   By: Marijo Conception M.D.   On: 07/20/2019 12:37   IR THORACENTESIS ASP PLEURAL SPACE W/IMG GUIDE  Result Date: 07/19/2019 INDICATION: Cirrhosis secondary to non alcoholic fatty liver disease. Right pleural effusion. Request for diagnostic and therapeutic thoracentesis. EXAM: ULTRASOUND GUIDED RIGHT THORACENTESIS MEDICATIONS: 1% lidocaine 10 COMPLICATIONS: None immediate. PROCEDURE: An ultrasound guided thoracentesis was thoroughly discussed with the patient and  questions answered. The benefits, risks, alternatives and complications were also discussed. The patient understands and wishes to proceed with the procedure. Written consent was obtained. Ultrasound was performed to localize and mark an adequate pocket of fluid in the right chest. The area was then prepped and draped in the normal sterile fashion. 1% Lidocaine was used for local anesthesia. Under ultrasound guidance a 6 Fr Safe-T-Centesis catheter was introduced. Thoracentesis was performed. The catheter was removed and a dressing applied. FINDINGS: A total of approximately 1.2 L of clear orange fluid was removed. Samples were sent to the laboratory as requested by the clinical team. IMPRESSION: Successful ultrasound guided right thoracentesis yielding 1.2 L of pleural fluid. Read by: Gareth Eagle, PA-C Electronically Signed   By: Corrie Mckusick D.O.   On: 07/19/2019 15:06   Recent Labs    07/20/19 0542 07/21/19 0456  WBC 7.3 9.3  HGB 8.6* 8.8*  HCT 27.5* 27.8*  PLT 90* 99*   Recent Labs    07/20/19 0542 07/21/19 0456  NA 131* 131*  K 4.3 4.1  CL 99 97*  CO2 24 24  GLUCOSE 159* 168*  BUN 15 18  CREATININE 0.82 0.84  CALCIUM 8.3* 8.3*    Physical Exam: BP (!) 148/60 (BP Location: Right Arm)   Pulse 90   Temp 99.1 F (37.3 C) (Oral)   Resp 16   Ht 5' 5"  (1.651 m)   Wt 69.8 kg   SpO2 95%   BMI 25.61 kg/m   Constitutional: No distress . Vital signs reviewed. HENT: Normocephalic.  Atraumatic. Eyes: EOMI. No discharge. Cardiovascular: No JVD. Respiratory: Normal effort.  No stridor. GI: Distended. Skin: Right AKA with serosanguinous drainage, erythema along staple lines improving Psych: Flat Musc: B/l LE edema Neuro: Alert Motor:  Left  lower extremity: Hip flexion, knee extension 4/5, ankle dorsiflexion tight heelcord, but able to dorsiflex, 4-/5 ADF Right lower extremity: Hip flexion 4/5 (some pain inhibition), slow improvement  Assessment/Plan: 1. Functional deficits  secondary to right AKA which require 3+ hours per day of interdisciplinary therapy in a comprehensive inpatient rehab setting.  Physiatrist is providing close team supervision and 24 hour management of active medical problems listed below.  Physiatrist and rehab team continue to assess barriers to discharge/monitor patient progress toward functional and medical goals  Care Tool:  Bathing    Body parts bathed by patient: Right arm, Left arm, Chest, Abdomen, Front perineal area, Right upper leg, Left upper leg, Face, Buttocks   Body parts bathed by helper: Buttocks, Left upper leg Body parts n/a: Right lower leg   Bathing assist Assist Level: Minimal Assistance - Patient > 75%     Upper Body Dressing/Undressing Upper body dressing   What is the patient wearing?: Pull over shirt    Upper body assist Assist Level: Set up assist    Lower Body Dressing/Undressing Lower body dressing      What is the patient wearing?: Pants     Lower body assist Assist for lower body dressing: Moderate Assistance - Patient 50 - 74%     Toileting Toileting    Toileting assist Assist for toileting: Moderate Assistance - Patient 50 - 74% Assistive Device Comment: BSC in bathroom   Transfers Chair/bed transfer  Transfers assist  Chair/bed transfer activity did not occur: Safety/medical concerns  Chair/bed transfer assist level: Contact Guard/Touching assist(slideboard)     Locomotion Ambulation   Ambulation assist   Ambulation activity did not occur: Safety/medical concerns(R AKA, L LE and generalized weakness, decreased balance/postural control, OA, and osteoperosis)          Walk 10 feet activity   Assist  Walk 10 feet activity did not occur: Safety/medical concerns(R AKA, L LE and generalized weakness, decreased balance/postural control, OA, and osteoperosis)        Walk 50 feet activity   Assist Walk 50 feet with 2 turns activity did not occur: Safety/medical  concerns(R AKA, L LE and generalized weakness, decreased balance/postural control, OA, and osteoperosis)         Walk 150 feet activity   Assist Walk 150 feet activity did not occur: Safety/medical concerns(R AKA, L LE and generalized weakness, decreased balance/postural control, OA, and osteoperosis)         Walk 10 feet on uneven surface  activity   Assist Walk 10 feet on uneven surfaces activity did not occur: Safety/medical concerns(R AKA, L LE and generalized weakness, decreased balance/postural control, OA, and osteoperosis)         Wheelchair     Assist Will patient use wheelchair at discharge?: Yes Type of Wheelchair: Manual    Wheelchair assist level: Independent Max wheelchair distance: 161f    Wheelchair 50 feet with 2 turns activity    Assist        Assist Level: Independent   Wheelchair 150 feet activity     Assist     Assist Level: Independent      Medical Problem List and Plan: 1.  Impaired function, ADLs and mobility secondary to R AKA due to failed TKRs  Cont CIR 2.  Antithrombotics: -DVT/anticoagulation:  Cannot do lovenox due to low platelets             -antiplatelet therapy: N/A 3. Pain Management: Tramadol 50 mg qid with robaxin/Hydrocodone prn  Phantom limb pain    Gabapentin 100 BID started on 4/13, changed to 300 mg nightly on 4/15, changed to twice daily on 4/19, tolerating  Controlled with meds on 4/30 4. Mood: LCSW to follow for evaluation and support.              -antipsychotic agents: N/A 5. Neuropsych: This patient is ?fully capable of making decisions on her own behalf.  Discussed admission with neuropsych, appreciate evaluation and recs 6. Skin/Wound Care: Monitor wound for healing once surgical dressing removed.   ABD pad, with Kerlix and Ace dressings to AKA. 7. Fluids/Electrolytes/Nutrition: Monitor I/Os 8. Cirrhosis of the liver:   Appreciate GI recs, discussed with hospitalist further recs - plan  for IV albumin and paracentesis today  IV Lasix 20 mg daily, increased to 40 mg due to increasing edema Filed Weights   07/05/19 0700 07/20/19 1643 07/21/19 0525  Weight: 73.6 kg 69.9 kg 69.8 kg  9. ABLA:   Hemoglobin 8.8 on 4/30  S/p transfusion on 4/27  Flex/Sig in 2-3 weeks  Continue to monitor 10. Chronic Thrombocytopenia: Monitor for signs of bleeding. Baseline at 74-->51.   Platelets 99 on 4/30  See #8  Continue to monitor 11. Chronic hyponatremia: Continue to monitor.   Sodium 131 on 4/30  Continue to monitor 12. T2DM with hyperglycemia: Hgb A1c-5.2.   Glucotrol 2.5 twice daily, discussed with pharmacy DC on 4/23  Continue Tradjenta 5 mg daily  Continue metformin   Lantus 20 units daily, decreased to 15 units on 4/18, discussed with pharmacy, [place] on 4/23  ?Trending up on 4/30 CBG (last 3)  Recent Labs    07/20/19 1630 07/20/19 2105 07/21/19 0648  GLUCAP 179* 168* 198*  13. H/o depression: ON Lexapro, Xanax and Wellbutrin. 14.  Labile blood pressure  Labile on 4/30 15.  Radiation cystitis-   Appreciate Urology recs - CBI d/ced. Await further Urology recs.   Will consider d/c foley over weekend if stable, otherwise may need to restart flushes  Repeat UA with large Hb on 4/29  Repeat UA ordered for today 16. Acute lower UTI  UA +, Ucx >100K Citrobacter  Completed Macrobid on 4/28  Repeat Ucx with multiple species - no value in recollection given CBI 17. Supratherapeutic INR  INR 1.5 on 4/30  After Vit K and FFP on 4/28 18. Pleural effusion  Seen on CXR, personally reviewed, repeat personally reviewed, improved  S/p thoracentesis  Cultures remains pending on 4/30   LOS: 17 days A FACE TO FACE EVALUATION WAS PERFORMED  Terren Jandreau Lorie Phenix 07/21/2019, 9:01 AM

## 2019-07-21 NOTE — Procedures (Signed)
PROCEDURE SUMMARY:  Successful US guided paracentesis from right lateral abdomen.  Yielded 1.4 of yellow, pale fluid.  No immediate complications.  Pt tolerated well.   Specimen was not sent for labs.  EBL < 84m  KDocia BarrierPA-C 07/21/2019 2:46 PM

## 2019-07-22 ENCOUNTER — Inpatient Hospital Stay (HOSPITAL_COMMUNITY): Payer: Medicare Other | Admitting: Occupational Therapy

## 2019-07-22 LAB — GLUCOSE, CAPILLARY
Glucose-Capillary: 157 mg/dL — ABNORMAL HIGH (ref 70–99)
Glucose-Capillary: 192 mg/dL — ABNORMAL HIGH (ref 70–99)
Glucose-Capillary: 216 mg/dL — ABNORMAL HIGH (ref 70–99)
Glucose-Capillary: 178 mg/dL — ABNORMAL HIGH (ref 70–99)

## 2019-07-22 LAB — PROTIME-INR
INR: 1.4 — ABNORMAL HIGH (ref 0.8–1.2)
Prothrombin Time: 16.8 seconds — ABNORMAL HIGH (ref 11.4–15.2)

## 2019-07-22 LAB — PATHOLOGIST SMEAR REVIEW

## 2019-07-22 MED ORDER — FOSFOMYCIN TROMETHAMINE 3 G PO PACK
3.0000 g | PACK | Freq: Once | ORAL | Status: AC
  Administered 2019-07-22: 3 g via ORAL
  Filled 2019-07-22: qty 3

## 2019-07-22 NOTE — Progress Notes (Addendum)
[place] PHYSICAL MEDICINE & REHABILITATION PROGRESS NOTE  Subjective/Complaints: Patient seen sitting up in bed this morning.  She states she slept well overnight.  She notes bladder and phantom limb pain this morning.  Discussed with nursing as well.  UA reviewed with patient and nursing.  Yesterday, she was seen by IM and VIR, notes reviewed-paracentesis performed yielding 1.4 L of fluid.  She was noted to have loose stools yesterday as well.  ROS: + Phantom limb pain, bladder pain.  Denies CP, SOB, N/V/D  Objective: Vital Signs: Blood pressure (!) 122/58, pulse 85, temperature 98.2 F (36.8 C), resp. rate 16, height 5' 5"  (1.651 m), weight 68.3 kg, SpO2 97 %. DG Chest 2 View  Result Date: 07/20/2019 CLINICAL DATA:  Pleural effusion. EXAM: CHEST - 2 VIEW COMPARISON:  July 19, 2019. FINDINGS: Stable cardiomediastinal silhouette. No pneumothorax or pleural effusion is noted. Atherosclerosis of thoracic aorta is noted. No acute pulmonary disease is noted. Bony thorax is unremarkable. IMPRESSION: No active cardiopulmonary disease. Aortic Atherosclerosis (ICD10-I70.0). Electronically Signed   By: Marijo Conception M.D.   On: 07/20/2019 12:37   IR Paracentesis  Result Date: 07/21/2019 INDICATION: Patient with history of cervical cancer, status post recent thoracentesis for pleural effusion, also with ascites. Request is made for therapeutic paracentesis. EXAM: ULTRASOUND GUIDED THERAPEUTIC PARACENTESIS MEDICATIONS: 10 mL 1% lidocaine COMPLICATIONS: None immediate. PROCEDURE: Informed written consent was obtained from the patient after a discussion of the risks, benefits and alternatives to treatment. A timeout was performed prior to the initiation of the procedure. Initial ultrasound scanning demonstrates a small amount of ascites within the right lower abdominal quadrant. The right lower abdomen was prepped and draped in the usual sterile fashion. 1% lidocaine was used for local anesthesia.  Following this, a 19 gauge, 7-cm, Yueh catheter was introduced. An ultrasound image was saved for documentation purposes. The paracentesis was performed. The catheter was removed and a dressing was applied. The patient tolerated the procedure well without immediate post procedural complication. FINDINGS: A total of approximately 1.4 liters of yellow fluid was removed. Samples were sent to the laboratory as requested by the clinical team. IMPRESSION: Successful ultrasound-guided therapeutic paracentesis yielding 1.4 liters of peritoneal fluid. Read by: Brynda Greathouse PA-C Electronically Signed   By: Aletta Edouard M.D.   On: 07/21/2019 15:07   Recent Labs    07/20/19 0542 07/21/19 0456  WBC 7.3 9.3  HGB 8.6* 8.8*  HCT 27.5* 27.8*  PLT 90* 99*   Recent Labs    07/20/19 0542 07/21/19 0456  NA 131* 131*  K 4.3 4.1  CL 99 97*  CO2 24 24  GLUCOSE 159* 168*  BUN 15 18  CREATININE 0.82 0.84  CALCIUM 8.3* 8.3*    Physical Exam: BP (!) 122/58 (BP Location: Right Arm)   Pulse 85   Temp 98.2 F (36.8 C)   Resp 16   Ht 5' 5"  (1.651 m)   Wt 68.3 kg   SpO2 97%   BMI 25.06 kg/m   Constitutional: No distress . Vital signs reviewed. HENT: Normocephalic.  Atraumatic. Eyes: EOMI. No discharge. Cardiovascular: No JVD. Respiratory: Normal effort.  No stridor. GI: Non-distended. Skin: Right AKA with dressing C/D/I Psych: Flat.  Depressed. Musc: Bilateral lower extremity edema, improving Neuro: Alert Motor:  Left lower extremity: Hip flexion, knee extension 4/5, ankle dorsiflexion tight heelcord, but able to dorsiflex, 4-/5 ADF Right lower extremity: Hip flexion 4/5 (some pain inhibition), slow improvement  Assessment/Plan: 1. Functional deficits secondary to  right AKA which require 3+ hours per day of interdisciplinary therapy in a comprehensive inpatient rehab setting.  Physiatrist is providing close team supervision and 24 hour management of active medical problems listed  below.  Physiatrist and rehab team continue to assess barriers to discharge/monitor patient progress toward functional and medical goals  Care Tool:  Bathing    Body parts bathed by patient: Right arm, Left arm, Chest, Abdomen, Front perineal area, Right upper leg, Left upper leg, Face, Buttocks   Body parts bathed by helper: Buttocks, Left upper leg Body parts n/a: Right lower leg   Bathing assist Assist Level: Minimal Assistance - Patient > 75%     Upper Body Dressing/Undressing Upper body dressing   What is the patient wearing?: Pull over shirt    Upper body assist Assist Level: Set up assist    Lower Body Dressing/Undressing Lower body dressing      What is the patient wearing?: Pants     Lower body assist Assist for lower body dressing: Moderate Assistance - Patient 50 - 74%     Toileting Toileting    Toileting assist Assist for toileting: Moderate Assistance - Patient 50 - 74% Assistive Device Comment: BSC in bathroom   Transfers Chair/bed transfer  Transfers assist  Chair/bed transfer activity did not occur: Safety/medical concerns  Chair/bed transfer assist level: Contact Guard/Touching assist(slideboard)     Locomotion Ambulation   Ambulation assist   Ambulation activity did not occur: Safety/medical concerns(R AKA, L LE and generalized weakness, decreased balance/postural control, OA, and osteoperosis)          Walk 10 feet activity   Assist  Walk 10 feet activity did not occur: Safety/medical concerns(R AKA, L LE and generalized weakness, decreased balance/postural control, OA, and osteoperosis)        Walk 50 feet activity   Assist Walk 50 feet with 2 turns activity did not occur: Safety/medical concerns(R AKA, L LE and generalized weakness, decreased balance/postural control, OA, and osteoperosis)         Walk 150 feet activity   Assist Walk 150 feet activity did not occur: Safety/medical concerns(R AKA, L LE and generalized  weakness, decreased balance/postural control, OA, and osteoperosis)         Walk 10 feet on uneven surface  activity   Assist Walk 10 feet on uneven surfaces activity did not occur: Safety/medical concerns(R AKA, L LE and generalized weakness, decreased balance/postural control, OA, and osteoperosis)         Wheelchair     Assist Will patient use wheelchair at discharge?: Yes Type of Wheelchair: Manual    Wheelchair assist level: Independent Max wheelchair distance: 157f    Wheelchair 50 feet with 2 turns activity    Assist        Assist Level: Independent   Wheelchair 150 feet activity     Assist     Assist Level: Independent      Medical Problem List and Plan: 1.  Impaired function, ADLs and mobility secondary to R AKA due to failed TKRs  Continue CIR  Daughter with questions regarding liver specialist-discussed with daughter. 2.  Antithrombotics: -DVT/anticoagulation:  Cannot do lovenox due to low platelets             -antiplatelet therapy: N/A 3. Pain Management: Tramadol 50 mg qid with robaxin/Hydrocodone prn  Phantom limb pain    Gabapentin 100 BID started on 4/13, changed to 300 mg nightly on 4/15, changed to twice daily on 4/19, tolerating  Some exacerbation, however had been relatively well controlled, likely secondary to UTI.  Will not increase medication at present due to concern for cognitive side effects 4. Mood: LCSW to follow for evaluation and support.              -antipsychotic agents: N/A 5. Neuropsych: This patient is ?fully capable of making decisions on her own behalf.  Discussed admission with neuropsych, appreciate evaluation and recs 6. Skin/Wound Care: Monitor wound for healing once surgical dressing removed.   ABD pad, with Kerlix and Ace dressings to AKA. 7. Fluids/Electrolytes/Nutrition: Monitor I/Os 8. Cirrhosis of the liver:   Appreciate GI recs, discussed with hospitalist further recs - plan for IV albumin and  paracentesis today  IV Lasix 20 mg daily, increased to 40 mg, plan to transition to p.o. soon  Status post paracentesis on 4/30 removing 1.4 L of fluid Filed Weights   07/20/19 1643 07/21/19 0525 07/22/19 0537  Weight: 69.9 kg 69.8 kg 68.3 kg   Stable on 5/1 9. ABLA:   Hemoglobin 8.8 on 4/30  S/p transfusion on 4/27  Flex/Sig in 2-3 weeks  Continue to monitor 10. Chronic Thrombocytopenia: Monitor for signs of bleeding. Baseline at 74-->51.   Platelets 99 on 4/30, labs ordered for Monday  See #8  Continue to monitor 11. Chronic hyponatremia: Continue to monitor.   Sodium 131 on 4/30  Continue to monitor 12. T2DM with hyperglycemia: Hgb A1c-5.2.   Glucotrol 2.5 twice daily, discussed with pharmacy, DCed on 4/23  Continue Tradjenta 5 mg daily  Continue metformin   Lantus 20 units daily, decreased to 15 units on 4/18, discussed with pharmacy, [place] on 4/23  Elevated on 5/1 CBG (last 3)  Recent Labs    07/21/19 1638 07/21/19 2107 07/22/19 0628  GLUCAP 148* 168* 157*  13. H/o depression: ON Lexapro, Xanax and Wellbutrin. 14.  Labile blood pressure  Relatively controlled on 5/1 15.  Radiation cystitis-   Appreciate Urology recs - CBI d/ced. Await further Urology recs.   May need to restart CBI  Repeat UA with large Hb on 4/29, again on 4/30  Foley continues to have bloody urine 16. Acute lower UTI  UA +, Ucx >100K Citrobacter  Completed Macrobid on 4/28  Repeat UA suggests infection, urine culture ordered  Discussed with pharmacy, decided to start fosfomycin 17. Supratherapeutic INR  INR 1.4 on 5/1  After Vit K and FFP on 4/28 18. Pleural effusion  Seen on CXR, personally reviewed, repeat personally reviewed, improved  S/p thoracentesis on 4/29, removing 1.1 L of fluid  Cultures remains pending on 5/1  > 35 minutes spent in counseling and coronation of care between patient and family regarding cirrhosis, GI issues, UTI, hematuria, etc.   LOS: 18 days A FACE TO FACE  EVALUATION WAS PERFORMED  Kerman Pfost Lorie Phenix 07/22/2019, 9:09 AM

## 2019-07-22 NOTE — Progress Notes (Signed)
Patient's husband called back again  requesting for patient to see liver specialist before discharge.

## 2019-07-22 NOTE — Progress Notes (Signed)
Husband and daughter called inquiring and wanting to speak with MD re: liver specialist. Relayed message to MD.

## 2019-07-22 NOTE — Progress Notes (Signed)
Patient c/o pain lower abdomen with spasm . Claims that she wants to urinate but cannot feel it. Reminded patient she has a foley. And further c/o pain right aka. MD aware. Meds given.

## 2019-07-22 NOTE — Progress Notes (Signed)
PROGRESS NOTE    Brittany Russo  JJK:[id number] DOB: August 27, 1945 DOA: 07/04/2019 PCP: Rosalee Kaufman, PA-C   Brief Narrative:  Consult for coagulopathy and hematuria. Have consulted urology and order foley catheter to placed.  Assessment & Plan   Coagulopathy/Elevated INR -INR was as high as 5.4 -Suspect secondary to impaired hepatic function given that patient has had a history of liver cirrhosis -Patient was given FFP as well as vitamin K -INR has improved to 1.4 from 5.4  Hematuria/UTI -Patient was had a history of endometrial cancer status post chemotherapy and radiation -Question if this is due to radiation cystitis -Reports having had frequent urinary tract infections which were treated with antibiotics -Overnight per nursing report, patient did have in and out cath and was noted to have passage of blood clots -Urology consulted and appreciated.  Discussed with Dr. Matilde Sprang, who recommended placing a 20 French Foley catheter and hand irrigation. -Ultrasound showed normal-appearing kidneys bilaterally.  Echogenic material within the bladder, which is partially distended-likely represents thrombus.  Mild ascites and large right pleural effusion.  Cirrhotic change of the liver noted. -CBI on 4/28, urine continues to be clear -UA showed: >50 WBC, Few bacteria, large leukocytes -Urine culture shows multiple species -Would not treat given that patient has had multiple urinary tract infections in the past and is likely colonized  Acute on chronic iron deficiency anemia -was transfused 1u PRBCs on 4/27 -hemoglobin up to 8.8 -continue to monitor CBC -Mesalamine added by gastroenterology-recommended flexible sigmoidoscopy in 2 to 3 weeks  NASH with cirrhosis, decompensated -Continue atenolol, lactulose, rifaximin -started IV lasix 59m daily on 4/28 -Gastroenterology consulted and appreciated, recommended Aldactone 100 mg daily along with Lasix 20 mg daily. -Patient appears  to have distended abdomen -Status post paracentesis yielding 1.4 L of fluid, albumin was given  Hypervolemia with pleural effusion/acute on chronic diastolic heart failure -likely secondary to cirrhosis -Echocardiogram July 2020 showed EF 637-16% LV diastolic Doppler parameters consistent with impaired relaxation -Status post thoracentesis yielding 1.1 L of fluid -Status post paracentesis yielding 1.4 L of fluid -Patient has been started on diuretics including IV Lasix as well as spironolactone -Monitor intake and output, daily weights  Right knee AKA -Continue therapy  Hyponatremia -possibly related to cirrhosis -continue BMP  Depression and anxiety -Continue lexapro and xanax  Diabetes mellitus, type II -Continue metformin, Tradjenta, insulin sliding scale with CBG monitoring  DVT Prophylaxis currently with anemia,  SCDs  Code Status: Full  Family Communication: None at bedside  Disposition Plan: Per primary.  [facility]  Procedures  Right AKA Renal ultrasound  Antibiotics   Anti-infectives (From admission, onward)   Start     Dose/Rate Route Frequency Ordered Stop   07/12/19 1000  nitrofurantoin (macrocrystal-monohydrate) (MACROBID) capsule 100 mg     100 mg Oral Every 12 hours 07/12/19 0839 07/18/19 2211   07/04/19 2000  rifaximin (XIFAXAN) tablet 550 mg     550 mg Oral 2 times daily 07/04/19 1401        Subjective:   [name].  Patient states she has been having multiple loose stools.  Denies current chest pain or shortness of breath.  Denies any abdominal pain, nausea or vomiting, dizziness or headache.  Does have phantom limb pain.  Does states she slept well overnight.  Hoping to go home soon.    Objective:   Vitals:   07/21/19 1534 07/21/19 2008 07/22/19 0405 07/22/19 0537  BP: (!) 125/58 (!) 141/52 ([name]  122/58   Pulse: 81 85 85   Resp: 17 16 16    Temp: 98.3 F (36.8 C) 98 F (36.7 C) 98.2 F (36.8 C)     TempSrc: Oral Oral    SpO2: 100% 100% 97%   Weight:    68.3 kg  Height:        Intake/Output Summary (Last 24 hours) at 07/22/2019 1017 Last data filed at 07/22/2019 0700 Gross per 24 hour  Intake 1162 ml  Output 2200 ml  Net -1038 ml   Filed Weights   07/20/19 1643 07/21/19 0525 07/22/19 0537  Weight: 69.9 kg 69.8 kg 68.3 kg   Exam  General: Well developed, chronically ill-appearing, NAD  HEENT: NCAT, mucous membranes moist.   Cardiovascular: S1 S2 auscultated, soft SEM, RRR  Respiratory: Clear to auscultation bilaterally with equal chest rise  Abdomen: Soft, nontender, nondistended, + bowel sounds  Extremities: R AKA, LLE edema  Neuro: AAOx3, nonfocal  Psych: Flat however appropriate, however question judgment insight into her medical problems  Data Reviewed: I have personally reviewed following labs and imaging studies  CBC: Recent Labs  Lab 07/18/19 0606 07/18/19 1803 07/19/19 0709 07/20/19 0542 07/21/19 0456  WBC 8.5 7.6 7.0 7.3 9.3  NEUTROABS 5.1  --  4.6 5.3  --   HGB 7.7* 6.8* 8.2* 8.6* 8.8*  HCT 25.8* 22.3* 26.5* 27.5* 27.8*  MCV 98.9 95.3 95.3 93.5 95.2  PLT 134* 123* 111* 90* 99*   Basic Metabolic Panel: Recent Labs  Lab 07/17/19 0609 07/18/19 1442 07/20/19 0542 07/21/19 0456  NA 130* 131* 131* 131*  K 4.4 4.6 4.3 4.1  CL 95* 98 99 97*  CO2 26 21* 24 24  GLUCOSE 147* 136* 159* 168*  BUN 13 19 15 18   CREATININE 0.87 0.99 0.82 0.84  CALCIUM 8.2* 8.4* 8.3* 8.3*   GFR: Estimated Creatinine Clearance: 55.3 mL/min (by C-G formula based on SCr of 0.84 mg/dL). Liver Function Tests: Recent Labs  Lab 07/18/19 1442 07/20/19 0542  AST 61* 62*  ALT 53* 56*  ALKPHOS 127* 140*  BILITOT 1.9* 2.0*  PROT 4.9* 5.1*  ALBUMIN 2.1* 2.1*   No results for input(s): LIPASE, AMYLASE in the last 168 hours. Recent Labs  Lab 07/18/19 1423  AMMONIA 34   Coagulation Profile: Recent Labs  Lab 07/18/19 1803 07/19/19 0709 07/20/19 1053  07/21/19 0456 07/22/19 0529  INR 5.4* 2.6* 1.4* 1.5* 1.4*   Cardiac Enzymes: No results for input(s): CKTOTAL, CKMB, CKMBINDEX, TROPONINI in the last 168 hours. BNP (last 3 results) No results for input(s): PROBNP in the last 8760 hours. HbA1C: No results for input(s): HGBA1C in the last 72 hours. CBG: Recent Labs  Lab 07/21/19 0648 07/21/19 1135 07/21/19 1638 07/21/19 2107 07/22/19 0628  GLUCAP 198* 193* 148* 168* 157*   Lipid Profile: No results for input(s): CHOL, HDL, LDLCALC, TRIG, CHOLHDL, LDLDIRECT in the last 72 hours. Thyroid Function Tests: No results for input(s): TSH, T4TOTAL, FREET4, T3FREE, THYROIDAB in the last 72 hours. Anemia Panel: No results for input(s): VITAMINB12, FOLATE, FERRITIN, TIBC, IRON, RETICCTPCT in the last 72 hours. Urine analysis:    Component Value Date/Time   COLORURINE YELLOW 07/21/2019 2246   APPEARANCEUR CLOUDY (A) 07/21/2019 2246   LABSPEC 1.024 07/21/2019 2246   PHURINE 6.0 07/21/2019 2246   GLUCOSEU NEGATIVE 07/21/2019 2246   HGBUR LARGE (A) 07/21/2019 2246   BILIRUBINUR NEGATIVE 07/21/2019 2246   BILIRUBINUR NEGATIVE 03/06/2013 1100   KETONESUR NEGATIVE 07/21/2019 2246   PROTEINUR 100 (A)  07/21/2019 2246   UROBILINOGEN negative 03/06/2013 1100   NITRITE POSITIVE (A) 07/21/2019 2246   LEUKOCYTESUR MODERATE (A) 07/21/2019 2246   Sepsis Labs: @LABRCNTIP (procalcitonin:4,lacticidven:4)  ) Recent Results (from the past 240 hour(s))  Culture, Urine     Status: Abnormal   Collection Time: 07/18/19  7:12 PM   Specimen: Urine, Random  Result Value Ref Range Status   Specimen Description URINE, RANDOM  Final   Special Requests   Final    NONE Performed at [facility], [place] [address] [place], [place] [address]    Culture MULTIPLE SPECIES PRESENT, SUGGEST RECOLLECTION (A)  Final   Report Status 07/19/2019 FINAL  Final  Gram stain     Status: None   Collection Time: 07/19/19  3:22 PM   Specimen: Lung, Right; Pleural  Fluid  Result Value Ref Range Status   Specimen Description FLUID RIGHT PLEURAL  Final   Special Requests NONE  Final   Gram Stain   Final    MODERATE WBC PRESENT, PREDOMINANTLY MONONUCLEAR NO ORGANISMS SEEN Performed at [facility], [place] [address], [place], [place] [address]    Report Status 07/19/2019 FINAL  Final  Culture, body fluid-bottle     Status: None (Preliminary result)   Collection Time: 07/19/19  3:22 PM   Specimen: Fluid  Result Value Ref Range Status   Specimen Description FLUID RIGHT PLEURAL  Final   Special Requests BOTTLES DRAWN AEROBIC AND ANAEROBIC 10CC  Final   Culture   Final    NO GROWTH 3 DAYS Performed at [facility], [place] [address], [place], [place] [address]    Report Status PENDING  Incomplete  Culture, Urine     Status: Abnormal   Collection Time: 07/20/19  4:30 PM   Specimen: Urine, Catheterized  Result Value Ref Range Status   Specimen Description URINE, CATHETERIZED  Final   Special Requests   Final    NONE Performed at [facility], [address], [place], [place] [address]    Culture MULTIPLE SPECIES PRESENT, SUGGEST RECOLLECTION (A)  Final   Report Status 07/21/2019 FINAL  Final      Radiology Studies: DG Chest 2 View  Result Date: 07/20/2019 CLINICAL DATA:  Pleural effusion. EXAM: CHEST - 2 VIEW COMPARISON:  July 19, 2019. FINDINGS: Stable cardiomediastinal silhouette. No pneumothorax or pleural effusion is noted. Atherosclerosis of thoracic aorta is noted. No acute pulmonary disease is noted. Bony thorax is unremarkable. IMPRESSION: No active cardiopulmonary disease. Aortic Atherosclerosis (ICD10-I70.0). Electronically Signed   By: Marijo Conception M.D.   On: 07/20/2019 12:37   IR Paracentesis  Result Date: 07/21/2019 INDICATION: Patient with history of cervical cancer, status post recent thoracentesis for pleural effusion, also with ascites. Request is made for therapeutic paracentesis. EXAM: ULTRASOUND GUIDED  THERAPEUTIC PARACENTESIS MEDICATIONS: 10 mL 1% lidocaine COMPLICATIONS: None immediate. PROCEDURE: Informed written consent was obtained from the patient after a discussion of the risks, benefits and alternatives to treatment. A timeout was performed prior to the initiation of the procedure. Initial ultrasound scanning demonstrates a small amount of ascites within the right lower abdominal quadrant. The right lower abdomen was prepped and draped in the usual sterile fashion. 1% lidocaine was used for local anesthesia. Following this, a 19 gauge, 7-cm, Yueh catheter was introduced. An ultrasound image was saved for documentation purposes. The paracentesis was performed. The catheter was removed and a dressing was applied. The patient tolerated the procedure well without immediate post procedural  complication. FINDINGS: A total of approximately 1.4 liters of yellow fluid was removed. Samples were sent to the laboratory as requested by the clinical team. IMPRESSION: Successful ultrasound-guided therapeutic paracentesis yielding 1.4 liters of peritoneal fluid. Read by: Brynda Greathouse PA-C Electronically Signed   By: Aletta Edouard M.D.   On: 07/21/2019 15:07     Scheduled Meds: . ALPRAZolam  0.25 mg Oral BID  . atenolol  50 mg Oral Daily  . atorvastatin  80 mg Oral Q2000  . buPROPion  150 mg Oral Daily  . Chlorhexidine Gluconate Cloth  6 each Topical Daily  . escitalopram  20 mg Oral Daily  . ferrous sulfate  325 mg Oral BID WC  . fluticasone  1 spray Each Nare Daily  . furosemide  40 mg Intravenous Daily  . gabapentin  300 mg Oral BID  . Gerhardt's butt cream   Topical QID  . insulin aspart  0-15 Units Subcutaneous TID WC  . insulin aspart  0-5 Units Subcutaneous QHS  . lactulose  10 g Oral BID  . linagliptin  5 mg Oral Daily  . loratadine  10 mg Oral Daily  . mesalamine  1,000 mg Rectal QHS  . metFORMIN  500 mg Oral BID WC  . rifaximin  550 mg Oral BID  . spironolactone  100 mg Oral Daily    Continuous Infusions:    LOS: 18 days   Time Spent in minutes   45 minutes  Audra Bellard D.O. on 07/22/2019 at 10:17 AM  Between 7am to 7pm - Please see pager noted on [email or web address]  After 7pm go to [email or web address]  And look for the night coverage person covering for me after hours  [facility] Office  [phone]

## 2019-07-23 LAB — COMPREHENSIVE METABOLIC PANEL
ALT: 50 U/L — ABNORMAL HIGH (ref 0–44)
AST: 49 U/L — ABNORMAL HIGH (ref 15–41)
Albumin: 2 g/dL — ABNORMAL LOW (ref 3.5–5.0)
Alkaline Phosphatase: 129 U/L — ABNORMAL HIGH (ref 38–126)
Anion gap: 7 (ref 5–15)
BUN: 14 mg/dL (ref 8–23)
CO2: 26 mmol/L (ref 22–32)
Calcium: 8 mg/dL — ABNORMAL LOW (ref 8.9–10.3)
Chloride: 95 mmol/L — ABNORMAL LOW (ref 98–111)
Creatinine, Ser: 0.75 mg/dL (ref 0.44–1.00)
GFR calc Af Amer: >60 mL/min (ref >60)
GFR calc non Af Amer: >60 mL/min (ref >60)
Glucose, Bld: 178 mg/dL — ABNORMAL HIGH (ref 70–99)
Potassium: 3.7 mmol/L (ref 3.5–5.1)
Sodium: 128 mmol/L — ABNORMAL LOW (ref 135–145)
Total Bilirubin: 1.8 mg/dL — ABNORMAL HIGH (ref 0.3–1.2)
Total Protein: 4.8 g/dL — ABNORMAL LOW (ref 6.5–8.1)

## 2019-07-23 LAB — CBC
HCT: 26.1 % — ABNORMAL LOW (ref 36.0–46.0)
Hemoglobin: 8.1 g/dL — ABNORMAL LOW (ref 12.0–15.0)
MCH: 29.3 pg (ref 26.0–34.0)
MCHC: 31 g/dL (ref 30.0–36.0)
MCV: 94.6 fL (ref 80.0–100.0)
Platelets: 70 10*3/uL — ABNORMAL LOW (ref 150–400)
RBC: 2.76 MIL/uL — ABNORMAL LOW (ref 3.87–5.11)
RDW: 18 % — ABNORMAL HIGH (ref 11.5–15.5)
WBC: 4.8 10*3/uL (ref 4.0–10.5)
nRBC: 0 % (ref 0.0–0.2)

## 2019-07-23 LAB — GLUCOSE, CAPILLARY
Glucose-Capillary: 165 mg/dL — ABNORMAL HIGH (ref 70–99)
Glucose-Capillary: 210 mg/dL — ABNORMAL HIGH (ref 70–99)
Glucose-Capillary: 168 mg/dL — ABNORMAL HIGH (ref 70–99)
Glucose-Capillary: 176 mg/dL — ABNORMAL HIGH (ref 70–99)

## 2019-07-23 LAB — PROTIME-INR
INR: 1.5 — ABNORMAL HIGH (ref 0.8–1.2)
Prothrombin Time: 17.2 seconds — ABNORMAL HIGH (ref 11.4–15.2)

## 2019-07-23 MED ORDER — FUROSEMIDE 20 MG PO TABS
20.0000 mg | ORAL_TABLET | Freq: Every day | ORAL | Status: DC
  Administered 2019-07-23 – 2019-07-27 (×5): 20 mg via ORAL
  Filled 2019-07-23 (×5): qty 1

## 2019-07-23 MED ORDER — LACTULOSE 10 GM/15ML PO SOLN
15.0000 g | Freq: Two times a day (BID) | ORAL | Status: DC
  Administered 2019-07-23 – 2019-07-27 (×7): 15 g via ORAL
  Filled 2019-07-23 (×9): qty 30

## 2019-07-23 NOTE — Progress Notes (Signed)
Patient ID: Brittany Russo, female   DOB: May 02, 1945, 74 y.o.   MRN: [id number] Subjective:  POD # 24 s/p right AKA    Patient reports pain as moderate related to phantom limb pain  Objective:   VITALS:   Vitals:   07/22/19 1925 07/23/19 0410  BP: (!) 134/54 (!) 141/59  Pulse: 82 84  Resp: 16 15  Temp: 98.2 F (36.8 C) 98.2 F (36.8 C)  SpO2: 100% 95%    Incision: scant drainage  Some blood present on the ACE wrap but minimal on xeroform and guaze Incision line intact  LABS Recent Labs    07/21/19 0456 07/23/19 0547  HGB 8.8* 8.1*  HCT 27.8* 26.1*  WBC 9.3 4.8  PLT 99* 70*    Recent Labs    07/21/19 0456 07/23/19 0547  NA 131* 128*  K 4.1 3.7  BUN 18 14  CREATININE 0.84 0.75  GLUCOSE 168* 178*    Recent Labs    07/22/19 0529 07/23/19 0547  INR 1.4* 1.5*     Assessment/Plan: 1.  S/p right AKA with phantom pain 2.  Chronic cirrhosis with recent exacerbation of hepatic related events, elevated INR, pleura effusion, ascites 3.  Chronic bladder infections    Plan: 1.  AKA RLE - staples can be removed tomorrow with dressing change Continue to try and treat her phantom limb pain  Appreciate that she was unde care of Physiatry team to address acute on chronic conditions that flared up as they have at each operative intervention These issues appear to be stabilizing  Post acute treatment plan at discharge for her AKA per Rehab team

## 2019-07-23 NOTE — Progress Notes (Signed)
PROGRESS NOTE    Brittany Russo  DJM:[id number] DOB: 04-Jan-1946 DOA: 07/04/2019 PCP: Rosalee Kaufman, PA-C   Brief Narrative:  Consult for coagulopathy and hematuria. Have consulted urology and order foley catheter to placed.  Assessment & Plan   Coagulopathy/Elevated INR -INR was as high as 5.4 -Suspect secondary to impaired hepatic function given that patient has had a history of liver cirrhosis -Patient was given FFP as well as vitamin K -INR has improved to 1.5 from 5.4  Hematuria/UTI -Patient was had a history of endometrial cancer status post chemotherapy and radiation -Question if this is due to radiation cystitis -Reports having had frequent urinary tract infections which were treated with antibiotics -Overnight per nursing report, patient did have in and out cath and was noted to have passage of blood clots -Urology consulted and appreciated.  Discussed with Dr. Matilde Sprang, who recommended placing a 20 French Foley catheter and hand irrigation. -Ultrasound showed normal-appearing kidneys bilaterally.  Echogenic material within the bladder, which is partially distended-likely represents thrombus.  Mild ascites and large right pleural effusion.  Cirrhotic change of the liver noted. -CBI on 4/28, urine continues to be clear -UA showed: >50 WBC, Few bacteria, large leukocytes -Urine culture shows multiple species -Would not treat given that patient has had multiple urinary tract infections in the past and is likely colonized  Acute on chronic iron deficiency anemia -was transfused 1u PRBCs on 4/27 -hemoglobin up to 8.1 -continue to monitor CBC -Mesalamine added by gastroenterology-recommended flexible sigmoidoscopy in 2 to 3 weeks  NASH with cirrhosis, decompensated -Continue atenolol, lactulose, rifaximin -started IV lasix 58m daily on 4/28 -Gastroenterology consulted and appreciated, recommended Aldactone 100 mg daily along with Lasix 20 mg daily. -Patient appears  to have distended abdomen -Status post paracentesis yielding 1.4 L of fluid, albumin was given -patient will need to follow up with GI as an outpatient  Hypervolemia with pleural effusion/acute on chronic diastolic heart failure -likely secondary to cirrhosis -Echocardiogram July 2020 showed EF 622-29% LV diastolic Doppler parameters consistent with impaired relaxation -Status post thoracentesis yielding 1.1 L of fluid -Status post paracentesis yielding 1.4 L of fluid -Patient has been started on diuretics including IV Lasix as well as spironolactone -Monitor intake and output, daily weights  Thrombocytopenia -Secondary to the above -No bleeding -continue to monitor   Right knee AKA -Continue therapy  Hyponatremia -possibly related to cirrhosis and lasix -continue BMP  Depression and anxiety -Continue lexapro and xanax  Diabetes mellitus, type II -Continue metformin, Tradjenta, insulin sliding scale with CBG monitoring  DVT Prophylaxis currently with anemia,  SCDs  Code Status: Full  Family Communication: None at bedside  Disposition Plan: Per primary.  [facility]  Procedures  Right AKA Renal ultrasound  Antibiotics   Anti-infectives (From admission, onward)   Start     Dose/Rate Route Frequency Ordered Stop   07/22/19 1300  fosfomycin (MONUROL) packet 3 g     3 g Oral  Once 07/22/19 1138 07/22/19 1230   07/12/19 1000  nitrofurantoin (macrocrystal-monohydrate) (MACROBID) capsule 100 mg     100 mg Oral Every 12 hours 07/12/19 0839 07/18/19 2211   07/04/19 2000  rifaximin (XIFAXAN) tablet 550 mg     550 mg Oral 2 times daily 07/04/19 1401        Subjective:   [name].  Continues to have phantom limb pain.  Slept well last night.  Denies current chest pain or shortness of breath.  Denies abdominal  pain, nausea vomiting.  Denies headache or dizziness.  Objective:   Vitals:   07/22/19 0537 07/22/19 1401 07/22/19  1925 07/23/19 0410  BP:  (!) 112/54 (!) 134/54 (!) 141/59  Pulse:  79 82 84  Resp:  18 16 15   Temp:  98.2 F (36.8 C) 98.2 F (36.8 C) 98.2 F (36.8 C)  TempSrc:      SpO2:  98% 100% 95%  Weight: 68.3 kg   66.5 kg  Height:        Intake/Output Summary (Last 24 hours) at 07/23/2019 0946 Last data filed at 07/23/2019 0700 Gross per 24 hour  Intake 600 ml  Output 1800 ml  Net -1200 ml   Filed Weights   07/21/19 0525 07/22/19 0537 07/23/19 0410  Weight: 69.8 kg 68.3 kg 66.5 kg   Exam  General: Well developed, chronically ill-appearing, NAD  HEENT: NCAT, mucous membranes moist.   Cardiovascular: S1 S2 auscultated, SEM, RRR  Respiratory: Clear to auscultation bilaterally with equal chest rise  Abdomen: Soft, nontender, nondistended, + bowel sounds  Extremities: R AKA, LLE edema  Neuro: AAOx3, nonfocal  Psych: Appropriate mood and affect, however question her insight into medical problems  Data Reviewed: I have personally reviewed following labs and imaging studies  CBC: Recent Labs  Lab 07/18/19 0606 07/18/19 0606 07/18/19 1803 07/19/19 0709 07/20/19 0542 07/21/19 0456 07/23/19 0547  WBC 8.5   < > 7.6 7.0 7.3 9.3 4.8  NEUTROABS 5.1  --   --  4.6 5.3  --   --   HGB 7.7*   < > 6.8* 8.2* 8.6* 8.8* 8.1*  HCT 25.8*   < > 22.3* 26.5* 27.5* 27.8* 26.1*  MCV 98.9   < > 95.3 95.3 93.5 95.2 94.6  PLT 134*   < > 123* 111* 90* 99* 70*   < > = values in this interval not displayed.   Basic Metabolic Panel: Recent Labs  Lab 07/17/19 0609 07/18/19 1442 07/20/19 0542 07/21/19 0456 07/23/19 0547  NA 130* 131* 131* 131* 128*  K 4.4 4.6 4.3 4.1 3.7  CL 95* 98 99 97* 95*  CO2 26 21* 24 24 26   GLUCOSE 147* 136* 159* 168* 178*  BUN 13 19 15 18 14   CREATININE 0.87 0.99 0.82 0.84 0.75  CALCIUM 8.2* 8.4* 8.3* 8.3* 8.0*   GFR: Estimated Creatinine Clearance: 58 mL/min (by C-G formula based on SCr of 0.75 mg/dL). Liver Function Tests: Recent Labs  Lab 07/18/19 1442  07/20/19 0542 07/23/19 0547  AST 61* 62* 49*  ALT 53* 56* 50*  ALKPHOS 127* 140* 129*  BILITOT 1.9* 2.0* 1.8*  PROT 4.9* 5.1* 4.8*  ALBUMIN 2.1* 2.1* 2.0*   No results for input(s): LIPASE, AMYLASE in the last 168 hours. Recent Labs  Lab 07/18/19 1423  AMMONIA 34   Coagulation Profile: Recent Labs  Lab 07/19/19 0709 07/20/19 1053 07/21/19 0456 07/22/19 0529 07/23/19 0547  INR 2.6* 1.4* 1.5* 1.4* 1.5*   Cardiac Enzymes: No results for input(s): CKTOTAL, CKMB, CKMBINDEX, TROPONINI in the last 168 hours. BNP (last 3 results) No results for input(s): PROBNP in the last 8760 hours. HbA1C: No results for input(s): HGBA1C in the last 72 hours. CBG: Recent Labs  Lab 07/22/19 0628 07/22/19 1213 07/22/19 1627 07/22/19 2056 07/23/19 0609  GLUCAP 157* 192* 216* 178* 165*   Lipid Profile: No results for input(s): CHOL, HDL, LDLCALC, TRIG, CHOLHDL, LDLDIRECT in the last 72 hours. Thyroid Function Tests: No results for input(s): TSH, T4TOTAL, FREET4,  T3FREE, THYROIDAB in the last 72 hours. Anemia Panel: No results for input(s): VITAMINB12, FOLATE, FERRITIN, TIBC, IRON, RETICCTPCT in the last 72 hours. Urine analysis:    Component Value Date/Time   COLORURINE YELLOW 07/21/2019 2246   APPEARANCEUR CLOUDY (A) 07/21/2019 2246   LABSPEC 1.024 07/21/2019 2246   PHURINE 6.0 07/21/2019 2246   GLUCOSEU NEGATIVE 07/21/2019 2246   HGBUR LARGE (A) 07/21/2019 2246   BILIRUBINUR NEGATIVE 07/21/2019 2246   BILIRUBINUR NEGATIVE 03/06/2013 [place] 07/21/2019 2246   PROTEINUR 100 (A) 07/21/2019 2246   UROBILINOGEN negative 03/06/2013 1100   NITRITE POSITIVE (A) 07/21/2019 2246   LEUKOCYTESUR MODERATE (A) 07/21/2019 2246   Sepsis Labs: @LABRCNTIP (procalcitonin:4,lacticidven:4)  ) Recent Results (from the past 240 hour(s))  Culture, Urine     Status: Abnormal   Collection Time: 07/18/19  7:12 PM   Specimen: Urine, Random  Result Value Ref Range Status    Specimen Description URINE, RANDOM  Final   Special Requests   Final    NONE Performed at [facility], [place] [address] [place], [place] [address]    Culture MULTIPLE SPECIES PRESENT, SUGGEST RECOLLECTION (A)  Final   Report Status 07/19/2019 FINAL  Final  Gram stain     Status: None   Collection Time: 07/19/19  3:22 PM   Specimen: Lung, Right; Pleural Fluid  Result Value Ref Range Status   Specimen Description FLUID RIGHT PLEURAL  Final   Special Requests NONE  Final   Gram Stain   Final    MODERATE WBC PRESENT, PREDOMINANTLY MONONUCLEAR NO ORGANISMS SEEN Performed at [facility], [place] [address] [place], [place] [address]    Report Status 07/19/2019 FINAL  Final  Culture, body fluid-bottle     Status: None (Preliminary result)   Collection Time: 07/19/19  3:22 PM   Specimen: Fluid  Result Value Ref Range Status   Specimen Description FLUID RIGHT PLEURAL  Final   Special Requests BOTTLES DRAWN AEROBIC AND ANAEROBIC 10CC  Final   Culture   Final    NO GROWTH 4 DAYS Performed at [facility], [place] [address], [place], [place] [address]    Report Status PENDING  Incomplete  Culture, Urine     Status: Abnormal   Collection Time: 07/20/19  4:30 PM   Specimen: Urine, Catheterized  Result Value Ref Range Status   Specimen Description URINE, CATHETERIZED  Final   Special Requests   Final    NONE Performed at [facility], [address] [place],  [address]    Culture MULTIPLE SPECIES PRESENT, SUGGEST RECOLLECTION (A)  Final   Report Status 07/21/2019 FINAL  Final      Radiology Studies: IR Paracentesis  Result Date: 07/21/2019 INDICATION: Patient with history of cervical cancer, status post recent thoracentesis for pleural effusion, also with ascites. Request is made for therapeutic paracentesis. EXAM: ULTRASOUND GUIDED THERAPEUTIC PARACENTESIS MEDICATIONS: 10 mL 1% lidocaine COMPLICATIONS: None immediate. PROCEDURE: Informed written consent was  obtained from the patient after a discussion of the risks, benefits and alternatives to treatment. A timeout was performed prior to the initiation of the procedure. Initial ultrasound scanning demonstrates a small amount of ascites within the right lower abdominal quadrant. The right lower abdomen was prepped and draped in the usual sterile fashion. 1% lidocaine was used for local anesthesia. Following this, a 19 gauge, 7-cm, Yueh catheter was introduced. An ultrasound image was saved for documentation purposes. The paracentesis was performed. The  catheter was removed and a dressing was applied. The patient tolerated the procedure well without immediate post procedural complication. FINDINGS: A total of approximately 1.4 liters of yellow fluid was removed. Samples were sent to the laboratory as requested by the clinical team. IMPRESSION: Successful ultrasound-guided therapeutic paracentesis yielding 1.4 liters of peritoneal fluid. Read by: Brynda Greathouse PA-C Electronically Signed   By: Aletta Edouard M.D.   On: 07/21/2019 15:07     Scheduled Meds: . ALPRAZolam  0.25 mg Oral BID  . atenolol  50 mg Oral Daily  . atorvastatin  80 mg Oral Q2000  . buPROPion  150 mg Oral Daily  . Chlorhexidine Gluconate Cloth  6 each Topical Daily  . escitalopram  20 mg Oral Daily  . ferrous sulfate  325 mg Oral BID WC  . fluticasone  1 spray Each Nare Daily  . furosemide  20 mg Oral Daily  . gabapentin  300 mg Oral BID  . Gerhardt's butt cream   Topical QID  . insulin aspart  0-15 Units Subcutaneous TID WC  . insulin aspart  0-5 Units Subcutaneous QHS  . lactulose  10 g Oral BID  . linagliptin  5 mg Oral Daily  . loratadine  10 mg Oral Daily  . mesalamine  1,000 mg Rectal QHS  . metFORMIN  500 mg Oral BID WC  . rifaximin  550 mg Oral BID  . spironolactone  100 mg Oral Daily   Continuous Infusions:    LOS: 19 days   Time Spent in minutes   30 minutes  Chet Greenley D.O. on 07/23/2019 at 9:46  AM  Between 7am to 7pm - Please see pager noted on [email or web address]  After 7pm go to [email or web address]  And look for the night coverage person covering for me after hours  [facility] Office  [phone]

## 2019-07-23 NOTE — Progress Notes (Signed)
Dressing changed per order noted minimal bleeding   on middle part of the incision.

## 2019-07-23 NOTE — Progress Notes (Signed)
[facility] PHYSICAL MEDICINE & REHABILITATION PROGRESS NOTE  Subjective/Complaints: Patient seen sitting up in bed this morning.  She states she slept well overnight.  Seen and examined with Ortho.  Overall, patient looks better.  She states that she continues to have phantom limb pain, but appears much more comfortable than yesterday.  She notes improvement in bladder pain.  She was seen by hospitalist, notes reviewed-labs ordered.  ROS: + Phantom limb pain.  Denies CP, SOB, N/V/D  Objective: Vital Signs: Blood pressure (!) 141/59, pulse 84, temperature 98.2 F (36.8 C), resp. rate 15, height 5' 5"  (1.651 m), weight 66.5 kg, SpO2 95 %. IR Paracentesis  Result Date: 07/21/2019 INDICATION: Patient with history of cervical cancer, status post recent thoracentesis for pleural effusion, also with ascites. Request is made for therapeutic paracentesis. EXAM: ULTRASOUND GUIDED THERAPEUTIC PARACENTESIS MEDICATIONS: 10 mL 1% lidocaine COMPLICATIONS: None immediate. PROCEDURE: Informed written consent was obtained from the patient after a discussion of the risks, benefits and alternatives to treatment. A timeout was performed prior to the initiation of the procedure. Initial ultrasound scanning demonstrates a small amount of ascites within the right lower abdominal quadrant. The right lower abdomen was prepped and draped in the usual sterile fashion. 1% lidocaine was used for local anesthesia. Following this, a 19 gauge, 7-cm, Yueh catheter was introduced. An ultrasound image was saved for documentation purposes. The paracentesis was performed. The catheter was removed and a dressing was applied. The patient tolerated the procedure well without immediate post procedural complication. FINDINGS: A total of approximately 1.4 liters of yellow fluid was removed. Samples were sent to the laboratory as requested by the clinical team. IMPRESSION: Successful ultrasound-guided therapeutic paracentesis yielding 1.4 liters  of peritoneal fluid. Read by: Brynda Greathouse PA-C Electronically Signed   By: Aletta Edouard M.D.   On: 07/21/2019 15:07   Recent Labs    07/21/19 0456 07/23/19 0547  WBC 9.3 4.8  HGB 8.8* 8.1*  HCT 27.8* 26.1*  PLT 99* 70*   Recent Labs    07/21/19 0456 07/23/19 0547  NA 131* 128*  K 4.1 3.7  CL 97* 95*  CO2 24 26  GLUCOSE 168* 178*  BUN 18 14  CREATININE 0.84 0.75  CALCIUM 8.3* 8.0*    Physical Exam: BP (!) 141/59 (BP Location: Right Arm)   Pulse 84   Temp 98.2 F (36.8 C)   Resp 15   Ht 5' 5"  (1.651 m)   Wt 66.5 kg   SpO2 95%   BMI 24.40 kg/m   Constitutional: No distress . Vital signs reviewed. HENT: Normocephalic.  Atraumatic. Eyes: EOMI. No discharge. Cardiovascular: No JVD. Respiratory: Normal effort.  No stridor. GI: Non-distended. Skin: Right AKA with staples and serosanguineous drainage, improving Psych: Normal mood.  Normal behavior. Musc: No edema in extremities.  No tenderness in extremities. Psych: Flat. Musc: Bilateral lower extremity edema, improving Neuro: Alert Motor:  Left lower extremity: Hip flexion, knee extension 4/5, ankle dorsiflexion tight heelcord, but able to dorsiflex, 4-/5 ADF Right lower extremity: Hip flexion 4/5 (some pain inhibition), slow improvement, unchanged  Assessment/Plan: 1. Functional deficits secondary to right AKA which require 3+ hours per day of interdisciplinary therapy in a comprehensive inpatient rehab setting.  Physiatrist is providing close team supervision and 24 hour management of active medical problems listed below.  Physiatrist and rehab team continue to assess barriers to discharge/monitor patient progress toward functional and medical goals  Care Tool:  Bathing    Body parts bathed by patient:  Right arm, Left arm, Chest, Abdomen, Front perineal area, Right upper leg, Left upper leg, Face, Buttocks   Body parts bathed by helper: Buttocks, Left upper leg Body parts n/a: Right lower leg    Bathing assist Assist Level: Minimal Assistance - Patient > 75%     Upper Body Dressing/Undressing Upper body dressing   What is the patient wearing?: Pull over shirt    Upper body assist Assist Level: Set up assist    Lower Body Dressing/Undressing Lower body dressing      What is the patient wearing?: Pants     Lower body assist Assist for lower body dressing: Moderate Assistance - Patient 50 - 74%     Toileting Toileting    Toileting assist Assist for toileting: Moderate Assistance - Patient 50 - 74% Assistive Device Comment: BSC in bathroom   Transfers Chair/bed transfer  Transfers assist  Chair/bed transfer activity did not occur: Safety/medical concerns  Chair/bed transfer assist level: Contact Guard/Touching assist(slideboard)     Locomotion Ambulation   Ambulation assist   Ambulation activity did not occur: Safety/medical concerns(R AKA, L LE and generalized weakness, decreased balance/postural control, OA, and osteoperosis)          Walk 10 feet activity   Assist  Walk 10 feet activity did not occur: Safety/medical concerns(R AKA, L LE and generalized weakness, decreased balance/postural control, OA, and osteoperosis)        Walk 50 feet activity   Assist Walk 50 feet with 2 turns activity did not occur: Safety/medical concerns(R AKA, L LE and generalized weakness, decreased balance/postural control, OA, and osteoperosis)         Walk 150 feet activity   Assist Walk 150 feet activity did not occur: Safety/medical concerns(R AKA, L LE and generalized weakness, decreased balance/postural control, OA, and osteoperosis)         Walk 10 feet on uneven surface  activity   Assist Walk 10 feet on uneven surfaces activity did not occur: Safety/medical concerns(R AKA, L LE and generalized weakness, decreased balance/postural control, OA, and osteoperosis)         Wheelchair     Assist Will patient use wheelchair at discharge?:  Yes Type of Wheelchair: Manual    Wheelchair assist level: Independent Max wheelchair distance: 142f    Wheelchair 50 feet with 2 turns activity    Assist        Assist Level: Independent   Wheelchair 150 feet activity     Assist     Assist Level: Independent      Medical Problem List and Plan: 1.  Impaired function, ADLs and mobility secondary to R AKA due to failed TKRs  Continue CIR, hopefully discharge on Tuesday if lab work and urinary symptoms stable  DC staples tomorrow per surgery  Appreciate hospitalist recs 2.  Antithrombotics: -DVT/anticoagulation:  Cannot do lovenox due to low platelets             -antiplatelet therapy: N/A 3. Pain Management: Tramadol 50 mg qid with robaxin/Hydrocodone prn  Phantom limb pain    Gabapentin 100 BID started on 4/13, changed to 300 mg nightly on 4/15, changed to twice daily on 4/19, tolerating  Some exacerbation, however had been relatively well controlled, likely secondary to UTI.  Will not increase medication at present due to concern for cognitive side effects  Appears to be improving, maintain medications for now 4. Mood: LCSW to follow for evaluation and support.              -  antipsychotic agents: N/A 5. Neuropsych: This patient is ?fully capable of making decisions on her own behalf.  Discussed admission with neuropsych, appreciate evaluation and recs 6. Skin/Wound Care: Monitor wound for healing once surgical dressing removed.   ABD pad, with Kerlix and Ace dressings to AKA. 7. Fluids/Electrolytes/Nutrition: Monitor I/Os 8. Cirrhosis of the liver:   Appreciate GI recs, discussed with hospitalist further recs - plan for IV albumin and paracentesis today  IV Lasix 20 mg daily, increased to 40 mg, transition to 20 p.o. on 5/2  Status post paracentesis on 4/30 removing 1.4 L of fluid Filed Weights   07/21/19 0525 07/22/19 0537 07/23/19 0410  Weight: 69.8 kg 68.3 kg 66.5 kg   Improving on 5/2  Discussed with  Ortho 9. ABLA:   Hemoglobin 8.1 on 5/2, trending down, may require transfusion, labs ordered for tomorrow  S/p transfusion on 4/27  Flex/Sig in 2-3 weeks  Continue to monitor 10. Chronic Thrombocytopenia: Monitor for signs of bleeding. Baseline at 74-->51.   Platelets 70 on 5/1, labs ordered for tomorrow  See #8  Continue to monitor 11. Chronic hyponatremia: Continue to monitor.   Sodium 128 on 5/2, likely confounded by Lasix, labs ordered for tomorrow  Continue to monitor 12. T2DM with hyperglycemia: Hgb A1c-5.2.   Glucotrol 2.5 twice daily, discussed with pharmacy, DCed on 4/23  Continue Tradjenta 5 mg daily  Continue metformin   Lantus 20 units daily, decreased to 15 units on 4/18, discussed with pharmacy, [place] on 4/23  Elevated on 5/2 CBG (last 3)  Recent Labs    07/22/19 1627 07/22/19 2056 07/23/19 0609  GLUCAP 216* 178* 165*  13. H/o depression: ON Lexapro, Xanax and Wellbutrin. 14.  Labile blood pressure  Controlled on 5/2 15.  Radiation cystitis-   Appreciate Urology recs - CBI d/ced.   May need to restart CBI  Repeat UA with large Hb on 4/29, again on 4/30  D/c foley today with voiding trial 16. Acute lower UTI  UA +, Ucx >100K Citrobacter  Completed Macrobid on 4/28  Repeat UA suggests infection, urine culture ordered-pending  Fosfomycin on 5/1 17. Supratherapeutic INR  INR 1.5 on 5/2  After Vit K and FFP on 4/28 18. Pleural effusion  Seen on CXR, personally reviewed, repeat personally reviewed, improved  S/p thoracentesis on 4/29, removing 1.1 L of fluid  Cultures remains pending on 5/2  LOS: 19 days A FACE TO FACE EVALUATION WAS PERFORMED  Calob Baskette Lorie Phenix 07/23/2019, 8:15 AM

## 2019-07-24 LAB — CBC
HCT: 27.4 % — ABNORMAL LOW (ref 36.0–46.0)
Hemoglobin: 8.6 g/dL — ABNORMAL LOW (ref 12.0–15.0)
MCH: 29.9 pg (ref 26.0–34.0)
MCHC: 31.4 g/dL (ref 30.0–36.0)
MCV: 95.1 fL (ref 80.0–100.0)
Platelets: 100 10*3/uL — ABNORMAL LOW (ref 150–400)
RBC: 2.88 MIL/uL — ABNORMAL LOW (ref 3.87–5.11)
RDW: 17.8 % — ABNORMAL HIGH (ref 11.5–15.5)
WBC: 6 10*3/uL (ref 4.0–10.5)
nRBC: 0 % (ref 0.0–0.2)

## 2019-07-24 LAB — GLUCOSE, CAPILLARY
Glucose-Capillary: 184 mg/dL — ABNORMAL HIGH (ref 70–99)
Glucose-Capillary: 234 mg/dL — ABNORMAL HIGH (ref 70–99)
Glucose-Capillary: 191 mg/dL — ABNORMAL HIGH (ref 70–99)
Glucose-Capillary: 188 mg/dL — ABNORMAL HIGH (ref 70–99)

## 2019-07-24 LAB — BASIC METABOLIC PANEL
Anion gap: 10 (ref 5–15)
BUN: 12 mg/dL (ref 8–23)
CO2: 26 mmol/L (ref 22–32)
Calcium: 8.2 mg/dL — ABNORMAL LOW (ref 8.9–10.3)
Chloride: 95 mmol/L — ABNORMAL LOW (ref 98–111)
Creatinine, Ser: 1.03 mg/dL — ABNORMAL HIGH (ref 0.44–1.00)
GFR calc Af Amer: >60 mL/min (ref >60)
GFR calc non Af Amer: 55 mL/min — ABNORMAL LOW (ref >60)
Glucose, Bld: 185 mg/dL — ABNORMAL HIGH (ref 70–99)
Potassium: 3.8 mmol/L (ref 3.5–5.1)
Sodium: 131 mmol/L — ABNORMAL LOW (ref 135–145)

## 2019-07-24 LAB — CULTURE, BODY FLUID W GRAM STAIN -BOTTLE: Culture: NO GROWTH

## 2019-07-24 LAB — PROTIME-INR
INR: 1.4 — ABNORMAL HIGH (ref 0.8–1.2)
Prothrombin Time: 16.7 seconds — ABNORMAL HIGH (ref 11.4–15.2)

## 2019-07-24 NOTE — Plan of Care (Signed)
  Problem: RH SAFETY Goal: RH STG ADHERE TO SAFETY PRECAUTIONS W/ASSISTANCE/DEVICE Description: STG Adhere to Safety Precautions With mod I Assistance/Device. Outcome: Progressing   Problem: RH PAIN MANAGEMENT Goal: RH STG PAIN MANAGED AT OR BELOW PT'S PAIN GOAL Description: < 4 Outcome: Progressing   

## 2019-07-24 NOTE — Progress Notes (Signed)
Pt has order for I/O cath q6 if no void or >300 ml. Patient's brief was wet and requested bedpan, pt had small output in bedpan. Nurse tech scanned patient, PVR was 440. Pt was cathed only for 125 ml, and PVR was 348 ml. Urine appearance was bloody and had blood and clots. PA Linna Hoff was made aware.

## 2019-07-24 NOTE — Progress Notes (Signed)
Staples removed from stump, R side is pink with clean attached edges. L side of wound is swollen, pink, with drainage but edges are attached. Wound was re-dressed and patient was left comfortably in bed

## 2019-07-24 NOTE — Progress Notes (Signed)
PROGRESS NOTE    Brittany Russo  ZOX:[id number] DOB: [birth year] DOA: 07/04/2019 PCP: Rosalee Kaufman, PA-C   Brief Narrative:  Consult for coagulopathy and hematuria. Have consulted urology and order foley catheter to placed.  Assessment & Plan   Coagulopathy/Elevated INR -INR was as high as 5.4 -Suspect secondary to impaired hepatic function given that patient has had a history of liver cirrhosis -Patient was given FFP as well as vitamin K -INR has improved from 5.4- currently ranging from 1.4-1.5  Hematuria/UTI -Patient was had a history of endometrial cancer status post chemotherapy and radiation -Question if this is due to radiation cystitis -Reports having had frequent urinary tract infections which were treated with antibiotics -Overnight per nursing report, patient did have in and out cath and was noted to have passage of blood clots -Urology consulted and appreciated.  Discussed with Dr. Matilde Sprang, who recommended placing a 20 French Foley catheter and hand irrigation. -Ultrasound showed normal-appearing kidneys bilaterally.  Echogenic material within the bladder, which is partially distended-likely represents thrombus.  Mild ascites and large right pleural effusion.  Cirrhotic change of the liver noted. -CBI on 4/28, urine continues to be clear -UA showed: >50 WBC, Few bacteria, large leukocytes -Urine culture shows multiple species -Would not treat given that patient has had multiple urinary tract infections in the past and is likely colonized -Overnight, patient had post void residual of 440, after she was cathed 125 mL resulted with a PVR of 348 mL.  Urine appearance is bloody with some clots.  Acute on chronic iron deficiency anemia -was transfused 1u PRBCs on 4/27 -hemoglobin up to 8.6 -continue to monitor CBC -Mesalamine added by gastroenterology-recommended flexible sigmoidoscopy in 2 to 3 weeks  NASH with cirrhosis, decompensated -Continue atenolol,  lactulose, rifaximin -started IV lasix 28m daily on 4/28 -Gastroenterology consulted and appreciated, recommended Aldactone 100 mg daily along with Lasix 20 mg daily. -Patient appears to have distended abdomen -Status post paracentesis yielding 1.4 L of fluid, albumin was given -patient will need to follow up with GI as an outpatient  Hypervolemia with pleural effusion/acute on chronic diastolic heart failure -likely secondary to cirrhosis -Echocardiogram July 2020 showed EF 681-19% LV diastolic Doppler parameters consistent with impaired relaxation -Status post thoracentesis yielding 1.1 L of fluid -Status post paracentesis yielding 1.4 L of fluid -Patient has been started on diuretics including IV Lasix as well as spironolactone -Monitor intake and output, daily weights  Thrombocytopenia -Secondary to the above -No bleeding -platelets have improved today- 100 -continue to monitor   Right knee AKA -Continue therapy  Hyponatremia -possibly related to cirrhosis and lasix -continue BMP  Depression and anxiety -Continue lexapro and xanax  Diabetes mellitus, type II -Continue metformin, Tradjenta, insulin sliding scale with CBG monitoring  DVT Prophylaxis currently with anemia,  SCDs  Code Status: Full  Family Communication: None at bedside  Disposition Plan: Per primary.  [facility]  [facility]cedures  Right AKA Renal ultrasound  Antibiotics   Anti-infectives (From admission, onward)   Start     Dose/Rate Route Frequency Ordered Stop   07/22/19 1300  fosfomycin (MONUROL) packet 3 g     3 g Oral  Once 07/22/19 1138 07/22/19 1230   07/12/19 1000  nitrofurantoin (macrocrystal-monohydrate) (MACROBID) capsule 100 mg     100 mg Oral Every 12 hours 07/12/19 0839 07/18/19 2211   07/04/19 2000  rifaximin (XIFAXAN) tablet 550 mg     550 mg Oral 2 times daily 07/04/19 1401  Subjective:   Brittany Russo seen and examined today.  No complaints this morning.   Feels weak.  Slept well last night.  Denies chest pain or shortness of breath, abdominal pain, nausea or vomiting, dizziness or headache.    Objective:   Vitals:   07/23/19 1428 07/23/19 2035 07/24/19 0423 07/24/19 0500  BP: (!) 112/50 (!) 135/58 135/62   Pulse: 76 77 85   Resp: 18 18 18    Temp: 97.8 F (36.6 C) 97.9 F (36.6 C) 98 F (36.7 C)   TempSrc:      SpO2: 100% 100% 97%   Weight:    66.9 kg  Height:        Intake/Output Summary (Last 24 hours) at 07/24/2019 1130 Last data filed at 07/24/2019 0440 Gross per 24 hour  Intake 360 ml  Output 1273 ml  Net -913 ml   Filed Weights   07/22/19 0537 07/23/19 0410 07/24/19 0500  Weight: 68.3 kg 66.5 kg 66.9 kg   Exam  General: Well developed, chronically ill-appearing, NAD  HEENT: NCAT, mucous membranes moist.   Cardiovascular: S1 S2 auscultated, SEM, RRR  Respiratory: Clear to auscultation bilaterally with equal chest rise  Abdomen: Soft, nontender, nondistended, + bowel sounds  Extremities: R AKA, LLE edema  Neuro: AAOx3, nonfocal  Psych: Appropriate mood, but question her insight to her medical problems  Data Reviewed: I have personally reviewed following labs and imaging studies  CBC: Recent Labs  Lab 07/18/19 0606 07/18/19 1803 07/19/19 0709 07/20/19 0542 07/21/19 0456 07/23/19 0547 07/24/19 0506  WBC 8.5   < > 7.0 7.3 9.3 4.8 6.0  NEUTROABS 5.1  --  4.6 5.3  --   --   --   HGB 7.7*   < > 8.2* 8.6* 8.8* 8.1* 8.6*  HCT 25.8*   < > 26.5* 27.5* 27.8* 26.1* 27.4*  MCV 98.9   < > 95.3 93.5 95.2 94.6 95.1  PLT 134*   < > 111* 90* 99* 70* 100*   < > = values in this interval not displayed.   Basic Metabolic Panel: Recent Labs  Lab 07/18/19 1442 07/20/19 0542 07/21/19 0456 07/23/19 0547 07/24/19 0506  NA 131* 131* 131* 128* 131*  K 4.6 4.3 4.1 3.7 3.8  CL 98 99 97* 95* 95*  CO2 21* 24 24 26 26   GLUCOSE 136* 159* 168* 178* 185*  BUN 19 15 18 14 12   CREATININE 0.99 0.82 0.84 0.75 1.03*  CALCIUM  8.4* 8.3* 8.3* 8.0* 8.2*   GFR: Estimated Creatinine Clearance: 45.1 mL/min (A) (by C-G formula based on SCr of 1.03 mg/dL (H)). Liver Function Tests: Recent Labs  Lab 07/18/19 1442 07/20/19 0542 07/23/19 0547  AST 61* 62* 49*  ALT 53* 56* 50*  ALKPHOS 127* 140* 129*  BILITOT 1.9* 2.0* 1.8*  PROT 4.9* 5.1* 4.8*  ALBUMIN 2.1* 2.1* 2.0*   No results for input(s): LIPASE, AMYLASE in the last 168 hours. Recent Labs  Lab 07/18/19 1423  AMMONIA 34   Coagulation Profile: Recent Labs  Lab 07/20/19 1053 07/21/19 0456 07/22/19 0529 07/23/19 0547 07/24/19 0506  INR 1.4* 1.5* 1.4* 1.5* 1.4*   Cardiac Enzymes: No results for input(s): CKTOTAL, CKMB, CKMBINDEX, TROPONINI in the last 168 hours. BNP (last 3 results) No results for input(s): PROBNP in the last 8760 hours. HbA1C: No results for input(s): HGBA1C in the last 72 hours. CBG: Recent Labs  Lab 07/23/19 0609 07/23/19 1146 07/23/19 1631 07/23/19 2105 07/24/19 0605  GLUCAP 165* 210* 168*  176* 184*   Lipid Profile: No results for input(s): CHOL, HDL, LDLCALC, TRIG, CHOLHDL, LDLDIRECT in the last 72 hours. Thyroid Function Tests: No results for input(s): TSH, T4TOTAL, FREET4, T3FREE, THYROIDAB in the last 72 hours. Anemia Panel: No results for input(s): VITAMINB12, FOLATE, FERRITIN, TIBC, IRON, RETICCTPCT in the last 72 hours. Urine analysis:    Component Value Date/Time   COLORURINE YELLOW 07/21/2019 2246   APPEARANCEUR CLOUDY (A) 07/21/2019 2246   LABSPEC 1.024 07/21/2019 2246   PHURINE 6.0 07/21/2019 2246   GLUCOSEU NEGATIVE 07/21/2019 2246   HGBUR LARGE (A) 07/21/2019 2246   BILIRUBINUR NEGATIVE 07/21/2019 2246   BILIRUBINUR NEGATIVE 03/06/2013 [place] 07/21/2019 2246   PROTEINUR 100 (A) 07/21/2019 2246   UROBILINOGEN negative 03/06/2013 1100   NITRITE POSITIVE (A) 07/21/2019 2246   LEUKOCYTESUR MODERATE (A) 07/21/2019 2246   Sepsis  Labs: @LABRCNTIP (procalcitonin:4,lacticidven:4)  ) Recent Results (from the past 240 hour(s))  Culture, Urine     Status: Abnormal   Collection Time: 07/18/19  7:12 PM   Specimen: Urine, Random  Result Value Ref Range Status   Specimen Description URINE, RANDOM  Final   Special Requests   Final    NONE Performed at [facility], [place] [address], [place], [place] [address]    Culture MULTIPLE SPECIES PRESENT, SUGGEST RECOLLECTION (A)  Final   Report Status 07/19/2019 FINAL  Final  Gram stain     Status: None   Collection Time: 07/19/19  3:22 PM   Specimen: Lung, Right; Pleural Fluid  Result Value Ref Range Status   Specimen Description FLUID RIGHT PLEURAL  Final   Special Requests NONE  Final   Gram Stain   Final    MODERATE WBC PRESENT, PREDOMINANTLY MONONUCLEAR NO ORGANISMS SEEN Performed at [facility], [place] [address], [place], [place] [address]    Report Status 07/19/2019 FINAL  Final  Culture, body fluid-bottle     Status: None   Collection Time: 07/19/19  3:22 PM   Specimen: Fluid  Result Value Ref Range Status   Specimen Description FLUID RIGHT PLEURAL  Final   Special Requests BOTTLES DRAWN AEROBIC AND ANAEROBIC 10CC  Final   Culture   Final    NO GROWTH 5 DAYS Performed at [facility], [place] [address] [place], [place] [address]    Report Status 07/24/2019 FINAL  Final  Culture, Urine     Status: Abnormal   Collection Time: 07/20/19  4:30 PM   Specimen: Urine, Catheterized  Result Value Ref Range Status   Specimen Description URINE, CATHETERIZED  Final   Special Requests   Final    NONE Performed at [facility], [place] [address], [place], [place] [address]    Culture MULTIPLE SPECIES PRESENT, SUGGEST RECOLLECTION (A)  Final   Report Status 07/21/2019 FINAL  Final      Radiology Studies: No results found.   Scheduled Meds: . ALPRAZolam  0.25 mg Oral BID  . atenolol  50 mg Oral Daily  . atorvastatin  80 mg Oral Q2000  .  buPROPion  150 mg Oral Daily  . escitalopram  20 mg Oral Daily  . ferrous sulfate  325 mg Oral BID WC  . fluticasone  1 spray Each Nare Daily  . furosemide  20 mg Oral Daily  . gabapentin  300 mg Oral BID  . Gerhardt's butt cream   Topical QID  . insulin aspart  0-15 Units Subcutaneous TID WC  .  insulin aspart  0-5 Units Subcutaneous QHS  . lactulose  15 g Oral BID  . linagliptin  5 mg Oral Daily  . loratadine  10 mg Oral Daily  . mesalamine  1,000 mg Rectal QHS  . metFORMIN  500 mg Oral BID WC  . rifaximin  550 mg Oral BID  . spironolactone  100 mg Oral Daily   Continuous Infusions:    LOS: 20 days   Time Spent in minutes   30 minutes  Davan Hark D.O. on 07/24/2019 at 11:30 AM  Between 7am to 7pm - Please see pager noted on [email or web address]  After 7pm go to [email or web address]  And look for the night coverage person covering for me after hours  [facility] Office  [phone]

## 2019-07-24 NOTE — Progress Notes (Signed)
[facility] PHYSICAL MEDICINE & REHABILITATION PROGRESS NOTE  Subjective/Complaints:  Pt reports peeing a lot more, however had an episide overnight, where in/out cathed due to hematuria and lacking of emptying- PVR of 348- Urology consulted? For foley? Per IM note, but no note seen from urology.   Pt kept asking if "liver was ok" and what occurs if she's home and something occurs- I explained she comes back to hospital.    ROS: Pt denies SOB, abd pain, CP, N/V/C/D, and vision changes   Objective: Vital Signs: Blood pressure 133/60, pulse 87, temperature 98.4 F (36.9 C), temperature source Oral, resp. rate 16, height 5\' 5"  (1.651 m), weight 66.9 kg, SpO2 98 %. No results found. Recent Labs    07/23/19 0547 07/24/19 0506  WBC 4.8 6.0  HGB 8.1* 8.6*  HCT 26.1* 27.4*  PLT 70* 100*   Recent Labs    07/23/19 0547 07/24/19 0506  NA 128* 131*  K 3.7 3.8  CL 95* 95*  CO2 26 26  GLUCOSE 178* 185*  BUN 14 12  CREATININE 0.75 1.03*  CALCIUM 8.0* 8.2*    Physical Exam: BP 133/60 (BP Location: Right Arm)   Pulse 87   Temp 98.4 F (36.9 C) (Oral)   Resp 16   Ht 5\' 5"  (1.651 m)   Wt 66.9 kg   SpO2 98%   BMI 24.54 kg/m   Constitutional: lying in bed; appears fairly comfortable, playing with buttons on shirt, NAD HENT: conjugate gaze Cardiovascular: RRR. Respiratory: CTA B/L- no W/R/R- good air movement GI: some peritoneal fluid- has mild fluid wave; soft, NT, not really distended; (+)BS Skin: Right AKA with staples and serosanguineous drainage, improving -time to remove staples Psych: sing song voice, but appropriate otherwise Musc: No edema in extremities.  No tenderness in extremities. Musc: Bilateral lower extremity edema, improving Neuro: Alert Motor:  Left lower extremity: Hip flexion, knee extension 4/5, ankle dorsiflexion tight heelcord, but able to dorsiflex, 4-/5 ADF Right lower extremity: Hip flexion 4/5 (some pain inhibition), slow improvement,  unchanged  Assessment/Plan: 1. Functional deficits secondary to right AKA which require 3+ hours per day of interdisciplinary therapy in a comprehensive inpatient rehab setting.  Physiatrist is providing close team supervision and 24 hour management of active medical problems listed below.  Physiatrist and rehab team continue to assess barriers to discharge/monitor patient progress toward functional and medical goals  Care Tool:  Bathing    Body parts bathed by patient: Right arm, Left arm, Chest, Abdomen, Front perineal area, Right upper leg, Left upper leg, Face, Buttocks   Body parts bathed by helper: Buttocks, Left upper leg Body parts n/a: Right lower leg   Bathing assist Assist Level: Minimal Assistance - Patient > 75%     Upper Body Dressing/Undressing Upper body dressing   What is the patient wearing?: Pull over shirt    Upper body assist Assist Level: Set up assist    Lower Body Dressing/Undressing Lower body dressing      What is the patient wearing?: Pants     Lower body assist Assist for lower body dressing: Moderate Assistance - Patient 50 - 74%     Toileting Toileting    Toileting assist Assist for toileting: Moderate Assistance - Patient 50 - 74% Assistive Device Comment: BSC in bathroom   Transfers Chair/bed transfer  Transfers assist  Chair/bed transfer activity did not occur: Safety/medical concerns  Chair/bed transfer assist level: Contact Guard/Touching assist(slideboard)     Locomotion Ambulation   Ambulation  assist   Ambulation activity did not occur: Safety/medical concerns(R AKA, L LE and generalized weakness, decreased balance/postural control, OA, and osteoperosis)          Walk 10 feet activity   Assist  Walk 10 feet activity did not occur: Safety/medical concerns(R AKA, L LE and generalized weakness, decreased balance/postural control, OA, and osteoperosis)        Walk 50 feet activity   Assist Walk 50 feet with 2  turns activity did not occur: Safety/medical concerns(R AKA, L LE and generalized weakness, decreased balance/postural control, OA, and osteoperosis)         Walk 150 feet activity   Assist Walk 150 feet activity did not occur: Safety/medical concerns(R AKA, L LE and generalized weakness, decreased balance/postural control, OA, and osteoperosis)         Walk 10 feet on uneven surface  activity   Assist Walk 10 feet on uneven surfaces activity did not occur: Safety/medical concerns(R AKA, L LE and generalized weakness, decreased balance/postural control, OA, and osteoperosis)         Wheelchair     Assist Will patient use wheelchair at discharge?: Yes Type of Wheelchair: Manual    Wheelchair assist level: Independent Max wheelchair distance: 132ft    Wheelchair 50 feet with 2 turns activity    Assist        Assist Level: Independent   Wheelchair 150 feet activity     Assist     Assist Level: Independent      Medical Problem List and Plan: 1.  Impaired function, ADLs and mobility secondary to R AKA due to failed TKRs  Continue CIR, hopefully discharge on Tuesday if lab work and urinary symptoms stable  DC staples tomorrow per surgery  Appreciate hospitalist recs  5/3- staples ordered to be removed today 2.  Antithrombotics: -DVT/anticoagulation:  Cannot do lovenox due to low platelets             -antiplatelet therapy: N/A 3. Pain Management: Tramadol 50 mg qid with robaxin/Hydrocodone prn  Phantom limb pain    Gabapentin 100 BID started on 4/13, changed to 300 mg nightly on 4/15, changed to twice daily on 4/19, tolerating  Some exacerbation, however had been relatively well controlled, likely secondary to UTI.  Will not increase medication at present due to concern for cognitive side effects  Appears to be improving, maintain medications for now 4. Mood: LCSW to follow for evaluation and support.              -antipsychotic agents: N/A 5.  Neuropsych: This patient is ?fully capable of making decisions on her own behalf.  Discussed admission with neuropsych, appreciate evaluation and recs 6. Skin/Wound Care: Monitor wound for healing once surgical dressing removed.   ABD pad, with Kerlix and Ace dressings to AKA. 7. Fluids/Electrolytes/Nutrition: Monitor I/Os 8. Cirrhosis of the liver:   Appreciate GI recs, discussed with hospitalist further recs - plan for IV albumin and paracentesis today  IV Lasix 20 mg daily, increased to 40 mg, transition to 20 p.o. on 5/2  Status post paracentesis on 4/30 removing 1.4 L of fluid  5/3- no significant changes- pt asked if needs t come back- I explained if she's having more problems.  Filed Weights   07/22/19 0537 07/23/19 0410 07/24/19 0500  Weight: 68.3 kg 66.5 kg 66.9 kg   Improving on 5/3- doing better  Discussed with Ortho 9. ABLA:   Hemoglobin 8.1 on 5/2, trending down, may require transfusion,  labs ordered for tomorrow  S/p transfusion on 4/27  Flex/Sig in 2-3 weeks  Continue to monitor 10. Chronic Thrombocytopenia: Monitor for signs of bleeding. Baseline at 74-->51.   Platelets 70 on 5/1, labs ordered for tomorrow  5/3- Plts 100k  See #8  Continue to monitor 11. Chronic hyponatremia: Continue to monitor.   Sodium 128 on 5/2, likely confounded by Lasix, labs ordered for tomorrow  5/3- Na up to 131-  Continue to monitor 12. T2DM with hyperglycemia: Hgb A1c-5.2.   Glucotrol 2.5 twice daily, discussed with pharmacy, DCed on 4/23  Continue Tradjenta 5 mg daily  Continue metformin   Lantus 20 units daily, decreased to 15 units on 4/18, discussed with pharmacy, [place] on 4/23  5/3- BGs 180s-230s- however no hypoglycemia episodes like last week.  CBG (last 3)  Recent Labs    07/24/19 0605 07/24/19 1145 07/24/19 1752  GLUCAP 184* 234* 191*  13. H/o depression: ON Lexapro, Xanax and Wellbutrin. 14.  Labile blood pressure  Controlled on 5/2 15.  Radiation cystitis-    Appreciate Urology recs - CBI d/ced.   May need to restart CBI  Repeat UA with large Hb on 4/29, again on 4/30  D/c foley today with voiding trial   5/3- there's a note from IM to replace foley? Since high PVRs/hematuria.  16. Acute lower UTI  UA +, Ucx >100K Citrobacter  Completed Macrobid on 4/28  Repeat UA suggests infection, urine culture ordered-pending  Fosfomycin on 5/1 17. Supratherapeutic INR  INR 1.5 on 5/2  After Vit K and FFP on 4/28 18. Pleural effusion  Seen on CXR, personally reviewed, repeat personally reviewed, improved  S/p thoracentesis on 4/29, removing 1.1 L of fluid  Cultures remains pending on 5/2  5/3- cannot find results?  LOS: 20 days A FACE TO FACE EVALUATION WAS PERFORMED  Jerrit Horen 07/24/2019, 7:12 PM

## 2019-07-25 ENCOUNTER — Telehealth (INDEPENDENT_AMBULATORY_CARE_PROVIDER_SITE_OTHER): Payer: Self-pay | Admitting: Internal Medicine

## 2019-07-25 LAB — GLUCOSE, CAPILLARY
Glucose-Capillary: 151 mg/dL — ABNORMAL HIGH (ref 70–99)
Glucose-Capillary: 183 mg/dL — ABNORMAL HIGH (ref 70–99)
Glucose-Capillary: 179 mg/dL — ABNORMAL HIGH (ref 70–99)
Glucose-Capillary: 195 mg/dL — ABNORMAL HIGH (ref 70–99)

## 2019-07-25 LAB — PROTIME-INR
INR: 1.5 — ABNORMAL HIGH (ref 0.8–1.2)
Prothrombin Time: 17.2 seconds — ABNORMAL HIGH (ref 11.4–15.2)

## 2019-07-25 NOTE — Telephone Encounter (Signed)
Spouse called stated patient had her leg amputated and is in rehab at [facility] - states she has been having liver issues and is retaining fluid - spouse also wants to know if there is a liver specialist closer to [facility] than the one at [facility] - please advise - [phone]

## 2019-07-25 NOTE — Progress Notes (Signed)
PROGRESS NOTE    Brittany Russo  IEP:[id number] DOB: 1945/05/28 DOA: 07/04/2019 PCP: Rosalee Kaufman, PA-C   Brief Narrative:  Consult for coagulopathy and hematuria. Have consulted urology and order foley catheter to placed.  Assessment & Plan   Coagulopathy/Elevated INR -INR was as high as 5.4 -Suspect secondary to impaired hepatic function given that patient has had a history of liver cirrhosis -Patient was given FFP as well as vitamin K -INR has improved from 5.4- currently ranging from 1.4-1.5  Hematuria/UTI -Patient was had a history of endometrial cancer status post chemotherapy and radiation -Question if this is due to radiation cystitis -Reports having had frequent urinary tract infections which were treated with antibiotics -Overnight per nursing report, patient did have in and out cath and was noted to have passage of blood clots -Urology consulted and appreciated.  Discussed with Dr. Matilde Sprang, who recommended placing a 20 French Foley catheter and hand irrigation. -Ultrasound showed normal-appearing kidneys bilaterally.  Echogenic material within the bladder, which is partially distended-likely represents thrombus.  Mild ascites and large right pleural effusion.  Cirrhotic change of the liver noted. -CBI on 4/28, urine continues to be clear -UA showed: >50 WBC, Few bacteria, large leukocytes -Urine culture shows multiple species -Would not treat given that patient has had multiple urinary tract infections in the past and is likely colonized -Overnight, patient had post void residual of 440, after she was cathed 125 mL resulted with a PVR of 348 mL.  Urine appearance is bloody with some clots.  Acute on chronic iron deficiency anemia -was transfused 1u PRBCs on 4/27 -hemoglobin up to 8.6 -continue to monitor CBC -Mesalamine added by gastroenterology-recommended flexible sigmoidoscopy in 2 to 3 weeks  NASH with cirrhosis, decompensated -Continue atenolol,  lactulose, rifaximin -started IV lasix 89m daily on 4/28 -Gastroenterology consulted and appreciated, recommended Aldactone 100 mg daily along with Lasix 20 mg daily. -Patient appears to have distended abdomen -Status post paracentesis yielding 1.4 L of fluid, albumin was given -patient will need to follow up with GI as an outpatient  Hypervolemia with pleural effusion/acute on chronic diastolic heart failure -likely secondary to cirrhosis -Echocardiogram July 2020 showed EF 666-06% LV diastolic Doppler parameters consistent with impaired relaxation -Status post thoracentesis yielding 1.1 L of fluid -Status post paracentesis yielding 1.4 L of fluid -Patient has been started on diuretics including IV Lasix as well as spironolactone -Monitor intake and output, daily weights  Thrombocytopenia -Secondary to the above -No bleeding -platelets have improved today- 100 -continue to monitor   Right knee AKA -Continue therapy  Hyponatremia -possibly related to cirrhosis and lasix -continue BMP weekly  Depression and anxiety -Continue lexapro and xanax  Diabetes mellitus, type II -Continue metformin, Tradjenta, insulin sliding scale with CBG monitoring  DVT Prophylaxis currently with anemia,  SCDs  Code Status: Full  Family Communication: None at bedside  Disposition Plan: Per primary. TRH will sign off. Discussed with Dr. PPosey Pronto  [facility]  Procedures  Right AKA Renal ultrasound  Antibiotics   Anti-infectives (From admission, onward)   Start     Dose/Rate Route Frequency Ordered Stop   07/22/19 1300  fosfomycin (MONUROL) packet 3 g     3 g Oral  Once 07/22/19 1138 07/22/19 1230   07/12/19 1000  nitrofurantoin (macrocrystal-monohydrate) (MACROBID) capsule 100 mg     100 mg Oral Every 12 hours 07/12/19 0839 07/18/19 2211   07/04/19 2000  rifaximin (XIFAXAN) tablet 550 mg     550 mg Oral  2 times daily 07/04/19 1401        Subjective:   Brittany Russo  seen and examined today.  Patient with no complaints this morning.  Does continue to feel weak.  Denies current chest pain, shortness of breath, abdominal pain, nausea vomiting, constipation, dizziness or headache. Objective:   Vitals:   07/24/19 1346 07/24/19 1955 07/25/19 0414 07/25/19 0500  BP: 133/60 (!) 120/51 (!) 137/52   Pulse: 87 76 76   Resp: 16 17 18    Temp: 98.4 F (36.9 C)  97.9 F (36.6 C)   TempSrc: Oral     SpO2: 98% 98% 95%   Weight:    70 kg  Height:        Intake/Output Summary (Last 24 hours) at 07/25/2019 1242 Last data filed at 07/25/2019 0836 Gross per 24 hour  Intake 300 ml  Output -  Net 300 ml   Filed Weights   07/23/19 0410 07/24/19 0500 07/25/19 0500  Weight: 66.5 kg 66.9 kg 70 kg   Exam  General: Well developed, chronically ill-appearing, NAD  HEENT: NCAT, mucous membranes moist.   Cardiovascular: S1 S2 auscultated, RRR, soft SEM  Respiratory: Clear to auscultation bilaterally  Abdomen: Soft, nontender, nondistended, + bowel sounds  Extremities: Right AKA, LLE edema  Neuro: AAOx3, nonfocal  Psych: Flat, however appropriate.  Question her insight into her medical problems and health  Data Reviewed: I have personally reviewed following labs and imaging studies  CBC: Recent Labs  Lab 07/19/19 0709 07/20/19 0542 07/21/19 0456 07/23/19 0547 07/24/19 0506  WBC 7.0 7.3 9.3 4.8 6.0  NEUTROABS 4.6 5.3  --   --   --   HGB 8.2* 8.6* 8.8* 8.1* 8.6*  HCT 26.5* 27.5* 27.8* 26.1* 27.4*  MCV 95.3 93.5 95.2 94.6 95.1  PLT 111* 90* 99* 70* 053*   Basic Metabolic Panel: Recent Labs  Lab 07/18/19 1442 07/20/19 0542 07/21/19 0456 07/23/19 0547 07/24/19 0506  NA 131* 131* 131* 128* 131*  K 4.6 4.3 4.1 3.7 3.8  CL 98 99 97* 95* 95*  CO2 21* 24 24 26 26   GLUCOSE 136* 159* 168* 178* 185*  BUN 19 15 18 14 12   CREATININE 0.99 0.82 0.84 0.75 1.03*  CALCIUM 8.4* 8.3* 8.3* 8.0* 8.2*   GFR: Estimated Creatinine Clearance: 49.2 mL/min (A) (by  C-G formula based on SCr of 1.03 mg/dL (H)). Liver Function Tests: Recent Labs  Lab 07/18/19 1442 07/20/19 0542 07/23/19 0547  AST 61* 62* 49*  ALT 53* 56* 50*  ALKPHOS 127* 140* 129*  BILITOT 1.9* 2.0* 1.8*  PROT 4.9* 5.1* 4.8*  ALBUMIN 2.1* 2.1* 2.0*   No results for input(s): LIPASE, AMYLASE in the last 168 hours. Recent Labs  Lab 07/18/19 1423  AMMONIA 34   Coagulation Profile: Recent Labs  Lab 07/21/19 0456 07/22/19 0529 07/23/19 0547 07/24/19 0506 07/25/19 0432  INR 1.5* 1.4* 1.5* 1.4* 1.5*   Cardiac Enzymes: No results for input(s): CKTOTAL, CKMB, CKMBINDEX, TROPONINI in the last 168 hours. BNP (last 3 results) No results for input(s): PROBNP in the last 8760 hours. HbA1C: No results for input(s): HGBA1C in the last 72 hours. CBG: Recent Labs  Lab 07/24/19 1145 07/24/19 1752 07/24/19 2124 07/25/19 0612 07/25/19 1146  GLUCAP 234* 191* 188* 151* 183*   Lipid Profile: No results for input(s): CHOL, HDL, LDLCALC, TRIG, CHOLHDL, LDLDIRECT in the last 72 hours. Thyroid Function Tests: No results for input(s): TSH, T4TOTAL, FREET4, T3FREE, THYROIDAB in the last 72 hours. Anemia  Panel: No results for input(s): VITAMINB12, FOLATE, FERRITIN, TIBC, IRON, RETICCTPCT in the last 72 hours. Urine analysis:    Component Value Date/Time   COLORURINE YELLOW 07/21/2019 2246   APPEARANCEUR CLOUDY (A) 07/21/2019 2246   LABSPEC 1.024 07/21/2019 2246   PHURINE 6.0 07/21/2019 2246   GLUCOSEU NEGATIVE 07/21/2019 2246   HGBUR LARGE (A) 07/21/2019 2246   BILIRUBINUR NEGATIVE 07/21/2019 2246   BILIRUBINUR NEGATIVE 03/06/2013 [place] 07/21/2019 2246   PROTEINUR 100 (A) 07/21/2019 2246   UROBILINOGEN negative 03/06/2013 1100   NITRITE POSITIVE (A) 07/21/2019 2246   LEUKOCYTESUR MODERATE (A) 07/21/2019 2246   Sepsis Labs: @LABRCNTIP (procalcitonin:4,lacticidven:4)  ) Recent Results (from the past 240 hour(s))  Culture, Urine     Status: Abnormal    Collection Time: 07/18/19  7:12 PM   Specimen: Urine, Random  Result Value Ref Range Status   Specimen Description URINE, RANDOM  Final   Special Requests   Final    NONE Performed at [facility], [place] [address] [place], [place] [address]    Culture MULTIPLE SPECIES PRESENT, SUGGEST RECOLLECTION (A)  Final   Report Status 07/19/2019 FINAL  Final  Gram stain     Status: None   Collection Time: 07/19/19  3:22 PM   Specimen: Lung, Right; Pleural Fluid  Result Value Ref Range Status   Specimen Description FLUID RIGHT PLEURAL  Final   Special Requests NONE  Final   Gram Stain   Final    MODERATE WBC PRESENT, PREDOMINANTLY MONONUCLEAR NO ORGANISMS SEEN Performed at [facility], [place] [address] [place], [place] [address]    Report Status 07/19/2019 FINAL  Final  Culture, body fluid-bottle     Status: None   Collection Time: 07/19/19  3:22 PM   Specimen: Fluid  Result Value Ref Range Status   Specimen Description FLUID RIGHT PLEURAL  Final   Special Requests BOTTLES DRAWN AEROBIC AND ANAEROBIC 10CC  Final   Culture   Final    NO GROWTH 5 DAYS Performed at [facility], [place] [address], [place], [place] [address]    Report Status 07/24/2019 FINAL  Final  Culture, Urine     Status: Abnormal   Collection Time: 07/20/19  4:30 PM   Specimen: Urine, Catheterized  Result Value Ref Range Status   Specimen Description URINE, CATHETERIZED  Final   Special Requests   Final    NONE Performed at [facility], [place] [address], [place], [place] [address]    Culture MULTIPLE SPECIES PRESENT, SUGGEST RECOLLECTION (A)  Final   Report Status 07/21/2019 FINAL  Final      Radiology Studies: No results found.   Scheduled Meds: . ALPRAZolam  0.25 mg Oral BID  . atenolol  50 mg Oral Daily  . atorvastatin  80 mg Oral Q2000  . buPROPion  150 mg Oral Daily  . escitalopram  20 mg Oral Daily  . ferrous sulfate  325 mg Oral BID WC  . fluticasone  1 spray Each Nare Daily   . furosemide  20 mg Oral Daily  . gabapentin  300 mg Oral BID  . Gerhardt's butt cream   Topical QID  . insulin aspart  0-15 Units Subcutaneous TID WC  . insulin aspart  0-5 Units Subcutaneous QHS  . lactulose  15 g Oral BID  . linagliptin  5 mg Oral Daily  . loratadine  10 mg Oral Daily  . mesalamine  1,000 mg Rectal  QHS  . metFORMIN  500 mg Oral BID WC  . rifaximin  550 mg Oral BID  . spironolactone  100 mg Oral Daily   Continuous Infusions:    LOS: 21 days   Time Spent in minutes   30 minutes  Nikash Mortensen D.O. on 07/25/2019 at 12:42 PM  Between 7am to 7pm - Please see pager noted on [email or web address]  After 7pm go to [email or web address]  And look for the night coverage person covering for me after hours  [facility] Office  [phone]

## 2019-07-25 NOTE — Care Management (Addendum)
Patient ID: Brittany Russo, female   DOB: 01/04/1946, 74 y.o.   MRN: [id number]   Discharge delayed originally to 07/25/19 from 07/19/19 for medical issues. Patient has declined functionally since level noted at original discharge date and daughter is concerned that she will not be able to manage the patient's care at discharge. Medical stability reviewed with physician and PA and discharge is still pending due to the noted physical decline while medical issues were being addressed. No new discharge date identified at the present time. Follow up [facility] services are secured with [facility] and on hold pending discharge.  Transportation for home is also secured and on hold for confirmed discharge date.

## 2019-07-25 NOTE — Plan of Care (Signed)
  Problem: RH SAFETY Goal: RH STG ADHERE TO SAFETY PRECAUTIONS W/ASSISTANCE/DEVICE Description: STG Adhere to Safety Precautions With mod I  Assistance/Device. Outcome: Progressing   Problem: RH SKIN INTEGRITY Goal: RH STG SKIN FREE OF INFECTION/BREAKDOWN Description: Patients skin will remain free from further infection or breakdown with Min assist. Outcome: Progressing Goal: RH STG MAINTAIN SKIN INTEGRITY WITH ASSISTANCE Description: STG Maintain Skin Integrity With min Assistance. Outcome: Progressing Goal: RH STG ABLE TO PERFORM INCISION/WOUND CARE W/ASSISTANCE Description: STG Able To Perform Incision/Wound Care With total Assistance from caregiver. Outcome: Progressing

## 2019-07-25 NOTE — Progress Notes (Signed)
[place] PHYSICAL MEDICINE & REHABILITATION PROGRESS NOTE  Subjective/Complaints: Patient seen sitting up in bed this morning.  She states he slept well overnight.  However, per nursing, anxious overnight and did not sleep well.  She appears calm this morning.  Discussed dressing changes with nursing.  WER:XVQMGQ CP, SOB, N/V/D  Objective: Vital Signs: Blood pressure (!) 137/52, pulse 76, temperature 97.9 F (36.6 C), resp. rate 18, height 5' 5"  (1.651 m), weight 70 kg, SpO2 95 %. No results found. Recent Labs    07/23/19 0547 07/24/19 0506  WBC 4.8 6.0  HGB 8.1* 8.6*  HCT 26.1* 27.4*  PLT 70* 100*   Recent Labs    07/23/19 0547 07/24/19 0506  NA 128* 131*  K 3.7 3.8  CL 95* 95*  CO2 26 26  GLUCOSE 178* 185*  BUN 14 12  CREATININE 0.75 1.03*  CALCIUM 8.0* 8.2*    Physical Exam: BP (!) 137/52 (BP Location: Right Arm)   Pulse 76   Temp 97.9 F (36.6 C)   Resp 18   Ht 5' 5"  (1.651 m)   Wt 70 kg   SpO2 95%   BMI 25.68 kg/m   Constitutional: No distress . Vital signs reviewed. HENT: Normocephalic.  Atraumatic. Eyes: EOMI. No discharge. Cardiovascular: No JVD. Respiratory: Normal effort.  No stridor. GI: Non-distended. Skin: Right AKA with mild serosanguineous drainage. Psych: Normal mood.  Normal behavior. Musc: Bilateral lower extremity edema Neuro: Alert Motor:  Left lower extremity: Hip flexion, knee extension 4/5, ankle dorsiflexion tight heelcord, but able to dorsiflex, 4-/5 ADF, unchanged Right lower extremity: Hip flexion 4/5 (some pain inhibition), slow improvement, improving  Assessment/Plan: 1. Functional deficits secondary to right AKA which require 3+ hours per day of interdisciplinary therapy in a comprehensive inpatient rehab setting.  Physiatrist is providing close team supervision and 24 hour management of active medical problems listed below.  Physiatrist and rehab team continue to assess barriers to discharge/monitor patient progress  toward functional and medical goals  Care Tool:  Bathing    Body parts bathed by patient: Right arm, Left arm, Chest, Abdomen, Front perineal area, Right upper leg, Left upper leg, Face, Buttocks   Body parts bathed by helper: Buttocks, Left upper leg Body parts n/a: Right lower leg   Bathing assist Assist Level: Minimal Assistance - Patient > 75%     Upper Body Dressing/Undressing Upper body dressing   What is the patient wearing?: Pull over shirt    Upper body assist Assist Level: Set up assist    Lower Body Dressing/Undressing Lower body dressing      What is the patient wearing?: Pants     Lower body assist Assist for lower body dressing: Moderate Assistance - Patient 50 - 74%     Toileting Toileting    Toileting assist Assist for toileting: Moderate Assistance - Patient 50 - 74% Assistive Device Comment: BSC in bathroom   Transfers Chair/bed transfer  Transfers assist  Chair/bed transfer activity did not occur: Safety/medical concerns  Chair/bed transfer assist level: Contact Guard/Touching assist(slideboard)     Locomotion Ambulation   Ambulation assist   Ambulation activity did not occur: Safety/medical concerns(R AKA, L LE and generalized weakness, decreased balance/postural control, OA, and osteoperosis)          Walk 10 feet activity   Assist  Walk 10 feet activity did not occur: Safety/medical concerns(R AKA, L LE and generalized weakness, decreased balance/postural control, OA, and osteoperosis)        Walk  50 feet activity   Assist Walk 50 feet with 2 turns activity did not occur: Safety/medical concerns(R AKA, L LE and generalized weakness, decreased balance/postural control, OA, and osteoperosis)         Walk 150 feet activity   Assist Walk 150 feet activity did not occur: Safety/medical concerns(R AKA, L LE and generalized weakness, decreased balance/postural control, OA, and osteoperosis)         Walk 10 feet on  uneven surface  activity   Assist Walk 10 feet on uneven surfaces activity did not occur: Safety/medical concerns(R AKA, L LE and generalized weakness, decreased balance/postural control, OA, and osteoperosis)         Wheelchair     Assist Will patient use wheelchair at discharge?: Yes Type of Wheelchair: Manual    Wheelchair assist level: Independent Max wheelchair distance: 18f    Wheelchair 50 feet with 2 turns activity    Assist        Assist Level: Independent   Wheelchair 150 feet activity     Assist     Assist Level: Independent      Medical Problem List and Plan: 1.  Impaired function, ADLs and mobility secondary to R AKA due to failed TKRs  Continue CIR, however patient has not been receiving  Plan to hopefully discharge tomorrow  Appreciate hospitalist recs 2.  Antithrombotics: -DVT/anticoagulation:  Cannot do lovenox due to low platelets             -antiplatelet therapy: N/A 3. Pain Management: Tramadol 50 mg qid with robaxin/Hydrocodone prn  Phantom limb pain    Gabapentin 100 BID started on 4/13, changed to 300 mg nightly on 4/15, changed to twice daily on 4/19, tolerating  Controlled with meds on 5/4 4. Mood: LCSW to follow for evaluation and support.              -antipsychotic agents: N/A 5. Neuropsych: This patient is ?fully capable of making decisions on her own behalf.  Discussed admission with neuropsych, appreciate evaluation and recs 6. Skin/Wound Care: Monitor wound for healing once surgical dressing removed.   ABD pad, with Kerlix and Ace dressings to AKA. 7. Fluids/Electrolytes/Nutrition: Monitor I/Os 8. Cirrhosis of the liver:   Appreciate GI recs, discussed with hospitalist further recs  IV Lasix 20 mg daily, increased to 40 mg, transition to 20 p.o. on 5/2  Status post paracentesis on 4/30 removing 1.4 L of fluid Filed Weights   07/23/19 0410 07/24/19 0500 07/25/19 0500  Weight: 66.5 kg 66.9 kg 70 kg   ?   Reliability on 5/4 9. ABLA:   Hemoglobin 8.6 on 5/3  S/p transfusion on 4/27  Flex/Sig in 2-3 weeks  Continue to monitor 10. Chronic Thrombocytopenia: Monitor for signs of bleeding. Baseline at 74-->51.   Platelets 100 on 5/3  See #8  Continue to monitor 11. Chronic hyponatremia: Continue to monitor.   Sodium 131 on 5/3  Continue to monitor 12. T2DM with hyperglycemia: Hgb A1c-5.2.   Glucotrol 2.5 twice daily, discussed with pharmacy, DCed on 4/23  Continue Tradjenta 5 mg daily  Continue metformin   Lantus 20 units daily, decreased to 15 units on 4/18, discussed with pharmacy, [place] 4/23  Labile and elevated on 5/3  CBG (last 3)  Recent Labs    07/24/19 1752 07/24/19 2124 07/25/19 0612  GLUCAP 191* 188* 151*  13. H/o depression: ON Lexapro, Xanax and Wellbutrin. 14.  Labile blood pressure  Controlled on 5/3 15.  Radiation cystitis-  Appreciate Urology recs - CBI d/ced.  Will discuss with urology prior to discharge  May need to restart CBI  Repeat UA with large Hb on 4/29, again on 4/30  D/ced foley with voiding trial  PVRs ordered 16. Acute lower UTI  UA +, Ucx >100K Citrobacter  Completed Macrobid on 4/28  Repeat UA suggests infection, urine culture ordered-remains pending  Fosfomycin on 5/1 17. Supratherapeutic INR  INR 1.5 on 5/3  After Vit K and FFP on 4/28 18. Pleural effusion  Seen on CXR, personally reviewed, repeat personally reviewed, improved  S/p thoracentesis on 4/29, removing 1.2 L of fluid  Cultures reactive mesothelial cells  LOS: 21 days A FACE TO FACE EVALUATION WAS PERFORMED  Micheline Markes Lorie Phenix 07/25/2019, 8:57 AM

## 2019-07-26 ENCOUNTER — Inpatient Hospital Stay (HOSPITAL_COMMUNITY): Payer: Medicare Other | Admitting: Occupational Therapy

## 2019-07-26 ENCOUNTER — Inpatient Hospital Stay (HOSPITAL_COMMUNITY): Payer: Medicare Other

## 2019-07-26 LAB — GLUCOSE, CAPILLARY
Glucose-Capillary: 143 mg/dL — ABNORMAL HIGH (ref 70–99)
Glucose-Capillary: 190 mg/dL — ABNORMAL HIGH (ref 70–99)
Glucose-Capillary: 199 mg/dL — ABNORMAL HIGH (ref 70–99)
Glucose-Capillary: 135 mg/dL — ABNORMAL HIGH (ref 70–99)

## 2019-07-26 LAB — PROTIME-INR
INR: 1.4 — ABNORMAL HIGH (ref 0.8–1.2)
Prothrombin Time: 16.6 seconds — ABNORMAL HIGH (ref 11.4–15.2)

## 2019-07-26 NOTE — Evaluation (Signed)
Physical Therapy Assessment and Plan  Patient Details  Name: Brittany Russo MRN: [id number] Date of Birth: Jun 17, 1945  PT Diagnosis: Abnormal posture, Impaired sensation and Muscle weakness Rehab Potential: Good ELOS: 3-5 days with emphasis on education   Today's Date: 07/26/2019 PT Individual Time: 1000-1100 PT Individual Time Calculation (min): 60 min    Problem List:  Patient Active Problem List   Diagnosis Date Noted  . Status post thoracentesis   . Lethargy   . Acute on chronic anemia   . Pleural effusion   . Supratherapeutic INR   . Urinary retention   . Acute lower UTI   . Radiation cystitis   . Labile blood pressure   . Labile blood glucose   . Anxiety state   . Controlled type 2 diabetes mellitus with hyperglycemia, with long-term current use of insulin ([place])   . Hyponatremia   . Thrombocytopenia ([place])   . Phantom limb pain ([place])   . Postoperative pain   . Amputation above knee ([place]) 07/04/2019  . S/P right knee disarticulation amputation 06/29/2019  . Above knee amputation of right lower extremity ([place]) 06/29/2019  . Hyperglycemia   . Failed total right knee replacement ([place])   . Hepatic encephalopathy ([place]) 12/28/2018  . UTI (urinary tract infection) 12/28/2018  . Bradycardia 12/28/2018  . Hyperkalemia 12/28/2018  . Hematuria 11/23/2018  . Overweight (BMI 25.0-29.9) 11/23/2018  . Mechanical problem with extremity 11/22/2018  . Cirrhosis of liver with ascites ([place]) 11/09/2018  . Nausea without vomiting 10/26/2018  . Ascites   . Rectal bleeding   . AKI (acute kidney injury) ([place]) 10/11/2018  . Lower GI bleed 10/10/2018  . Anasarca 10/10/2018  . Hypoalbuminemia 10/10/2018  . Depression 09/28/2018  . Acute hepatic encephalopathy 09/28/2018  . Cirrhosis ([place]) 09/28/2018  . Periprosthetic fracture around internal prosthetic right knee joint 09/24/2018  . S/P right TKA 09/22/2018  . Status post total knee replacement, right 09/22/2018  . DM2 (diabetes  mellitus, type 2) ([place]) 07/11/2015  . Hypertension 07/11/2015  . Hyperlipidemia 07/11/2015    Past Medical History:  Past Medical History:  Diagnosis Date  . Acute and subacute hepatic failure without coma   . Allergy   . Anasarca   . Anxiety   . Arthritis   . Ascites 09/2018   no SBP on 850 cc tap 10/13/18  . Asthma    allery induced  . Cirrhosis of liver ([place]) ~ 2004   from NAFLD.  Dx ~ 2004.  Dr Dorcas Mcmurray at [facility]/    . Depression   . Diabetes mellitus without complication ([place])    type 2  . Eczema of both hands   . Endometrial cancer ([place]) 1996, 2003   hysterectomy 1996.  recurrence 2003, treated with radiation.    . Generalized edema   . GI hemorrhage   . 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 in [place]  . Hyperlipidemia   . Hypertension   . Muscle weakness   . Seizures ([place])    last seizure June 07, 2014   . Thrombocytopenia ([place])   . Thyroid nodule    Past Surgical History:  Past Surgical History:  Procedure Laterality Date  . AMPUTATION Right 06/29/2019   Procedure: AMPUTATION ABOVE KNEE through knee;  Surgeon: Paralee Cancel, MD;  Location: [facility];  Service: Orthopedics;  Laterality: Right;  2 hrs  . CATARACT EXTRACTION W/PHACO Left 06/04/2017   Procedure: CATARACT EXTRACTION PHACO AND INTRAOCULAR  LENS PLACEMENT (IOC);  Surgeon: Baruch Goldmann, MD;  Location: [facility];  Service: Ophthalmology;  Laterality: Left;  CDE: 3.90  . CATARACT EXTRACTION W/PHACO Right 07/15/2017   Procedure: CATARACT EXTRACTION PHACO AND INTRAOCULAR LENS PLACEMENT (IOC);  Surgeon: Baruch Goldmann, MD;  Location: [facility];  Service: Ophthalmology;  Laterality: Right;  CDE: 7.60  . COLONOSCOPY  06/2007   in [place]. for hematochzia.  radiation proctitis  . DILATION AND CURETTAGE OF UTERUS    . ESOPHAGOGASTRODUODENOSCOPY  2016  . ESOPHAGOGASTRODUODENOSCOPY (EGD) WITH PROPOFOL N/A 12/30/2018   Procedure: EGD;  Surgeon: Clarene Essex, MD;   Location: [facility];  Service: Endoscopy;  Laterality: N/A;  . EXCISIONAL TOTAL KNEE ARTHROPLASTY Right 01/03/2019   Procedure: EXCISIONAL TOTAL KNEE ARTHROPLASTY;  Surgeon: Paralee Cancel, MD;  Location: [facility];  Service: Orthopedics;  Laterality: Right;  . FLEXIBLE SIGMOIDOSCOPY N/A 12/30/2018   Procedure: FLEXIBLE SIGMOIDOSCOPY;  Surgeon: Clarene Essex, MD;  [facility];  Service: Endoscopy;  Laterality: N/A;  . IR PARACENTESIS  10/13/2018  . IR PARACENTESIS  07/21/2019  . IR THORACENTESIS ASP PLEURAL SPACE W/IMG GUIDE  07/19/2019  . STERIOD INJECTION Left 06/29/2019   Procedure: LEFT KNEE INJECTION;  Surgeon: Paralee Cancel, MD;  [facility];  Service: Orthopedics;  Laterality: Left;  . TOTAL ABDOMINAL HYSTERECTOMY  1996  . TOTAL KNEE ARTHROPLASTY Right 09/22/2018   Procedure: TOTAL KNEE ARTHROPLASTY;  Surgeon: Paralee Cancel, MD;  [facility];  Service: Orthopedics;  Laterality: Right;  70 mins  . TOTAL KNEE REVISION Right 09/27/2018   Procedure: TOTAL KNEE REVISION;  Surgeon: Paralee Cancel, MD;  [facility];  Service: Orthopedics;  Laterality: Right;  . TOTAL KNEE REVISION Right 11/22/2018   Procedure: repair right knee extensor mechanism;  Surgeon: Paralee Cancel, MD;  Location: [facility];  Service: Orthopedics;  Laterality: Right;  90 mins    Assessment & Plan Clinical Impression: Patient is a 74 y.o. year old female with PMH of cirrhosis, ascites, depression, HTN, and DM2 admitted to [facility] on 06/29/2019 for R AKA.  Pt underwent R TKA on 09/22/2018 per Dr. Alvan Dame, and fell after leaving the hospital on 7/4, sustaining a R distal femur periprosthetic fracture.  She underwent multiple surgical and non-surgical attempts to salvage the knee, and ultimately opted for a R AKA, per Dr. Alvan Dame on 06/29/2019.  Post op course pain management.  Therapy evaluations were completed and pt was recommended for CIR.   Patient was discharged from therapy 4/27 due to having met therapy goals  and completing hands on family education training with pt's daughter and husband. Prior to D/C from CIR, pt with increased medical issues impacting pt's discharge home.  Since increased medical issues, pt has required increased assistance with mobility and transfers. Therefore therapy was reconsulted and pt was evaluated again to allow for focus on transfers, OOB activity, and re-education with family prior to d/c home.  Patient currently requires mod with mobility secondary to muscle weakness and decreased standing balance, decreased postural control, decreased balance strategies and difficulty maintaining precautions.  Prior to hospitalization, patient was min with mobility and lived with Spouse in a [place] home.  Home access is  Level entry, Elevator(lives on 3rd floor but able to take elevator).  Patient will benefit from skilled PT intervention to maximize safe functional mobility, minimize fall risk and decrease caregiver burden for planned discharge home with 24 hour assist.  Anticipate patient will benefit from follow up [facility] at discharge.  PT - End  of Session Activity Tolerance: Tolerates 30+ min activity with multiple rests Endurance Deficit: Yes Endurance Deficit Description: requires frequent rest breaks PT Assessment Rehab Potential (ACUTE/IP ONLY): Good PT Barriers to Discharge: Wound Care;Behavior PT Barriers to Discharge Comments: anxiety, fear of falling PT Patient demonstrates impairments in the following area(s): Balance;Endurance;Motor;Pain;Sensory PT Transfers Functional Problem(s): Bed Mobility;Bed to Chair;Car PT Locomotion Functional Problem(s): Wheelchair Mobility PT Plan PT Intensity: Minimum of 1-2 x/day ,45 to 90 minutes PT Frequency: 5 out of 7 days PT Duration Estimated Length of Stay: 3-5 days with emphasis on education PT Treatment/Interventions: Balance/vestibular training;Community reintegration;Discharge planning;Disease management/prevention;DME/adaptive  equipment instruction;Functional mobility training;Pain management;Patient/family education;Skin care/wound management;Therapeutic Exercise;Therapeutic Activities;UE/LE Strength taining/ROM;Wheelchair propulsion/positioning;UE/LE Coordination activities PT Transfers Anticipated Outcome(s): min A PT Locomotion Anticipated Outcome(s): mod I WC mobility PT Recommendation Follow Up Recommendations: Home health PT Patient destination: Home Equipment Recommended: To be determined Equipment Details: has w/c and slide board already  Skilled Therapeutic Intervention Evaluation completed (see details above and below) with education on PT POC and goals and individual treatment initiated with focus on functional mobility/transfers, UE/LE strength, dynamic sitting/standing balance, and improved activity tolerance. Received pt sitting in WC, pt agreeable to therapy, and reported phantom limb pain in R LE but reported she recently received pain medication for it. Pt educated on PT evaluation, CIR policies, and therapy schedule and agreeable. Pt performed WC mobility 156f using bilateral UEs and supervision to ortho gym. Pt performed simulated car transfer with slideboard with mod A. Pt required verbal cues for anterior weight shifting, hand placement on board and WC armrest, and to push through L LE. Pt performed WC mobility on uneven surfaces (ramp) 185fwith min A. Pt performed bilateral UE strengthening on UBE for 3 minutes forwards on level 1.5. Pt required multiple rest breaks throughout session due to increased fatigue. [facility] and transferred WC<>bed via slideboard with mod A and sit<>supine with mod A. Pt rolled L and R with supervision and use of bedrails and transferred supine<>sitting EOB with CGA from flat bed with use of bedrails. Pt transferred bed<>WC via slideboard mod A. Concluded session with pt sitting in WC, needs within reach, and seatbelt alarm on. Therapist  provided pt with fresh ice water. Safety plan updated.   PT Evaluation Precautions/Restrictions Precautions Precautions: Fall Precaution Comments: R AKA, likes to wear her L AFO/shoe Restrictions Weight Bearing Restrictions: Yes RLE Weight Bearing: Non weight bearing Home Living/Prior Functioning Home Living Available Help at Discharge: Family Type of Home: Apartment Home Access: Level entry;Elevator(lives on 3rd floor but able to take elevator) Home Layout: One level Bathroom Shower/Tub: TuChiropodistHandicapped height Bathroom Accessibility: Yes Additional Comments: pt reports she has a CNA come to assist her from 10am-1pm every day. She assists with bathing, dressing. pt reports he daughter helps as well and her husband.   Lives With: Spouse Prior Function Level of Independence: Requires assistive device for independence;Needs assistance with ADLs;Needs assistance with tranfers;Needs assistance with homemaking  Able to Take Stairs?: No Driving: No Comments: Pt has been non-ambulatory since July of 2020 after R TKA, needed supervision-min assist w/ slide board transfers. She did not slide board to/from car, used paid handicap transportation. She did practice sit<>stands to RW w/ therapy prior to admisison, but only reports "it did not go well". Cognition Overall Cognitive Status: Within Functional Limits for tasks assessed Arousal/Alertness: Awake/alert Orientation Level: Oriented X4 Memory: Appears intact Awareness: Appears intact Problem Solving: Appears intact Safety/Judgment:  Appears intact Comments: pt is fearful of falling and anxious, increased fearfulness after not getting OOB much over past week since initial d/c from therapy Sensation Sensation Light Touch: Impaired by gross assessment Peripheral sensation comments: absent along incision on R residual limb, decreased along L1-2 dermatome on R. WFL on L LE. Proprioception: Impaired by gross  assessment Additional Comments: Pt reports constant numbness on R residual limb Coordination Gross Motor Movements are Fluid and Coordinated: No Fine Motor Movements are Fluid and Coordinated: Yes Coordination and Movement Description: grossly uncoordinated due to R AKA, generalized weakness, anxiety, and fatigue Finger Nose Finger Test: [facility] Heel Shin Test: unable to perform on R LE due to AKA, WFL on L LE Motor  Motor Motor: Abnormal postural alignment and control Motor - Skilled Clinical Observations: grossly uncoordinated due to R AKA, generalized weakness, anxiety, and fatigue. Wears L AFO due to foot drop  Mobility Bed Mobility Bed Mobility: Rolling Right;Rolling Left;Supine to Sit;Sit to Supine Rolling Right: Supervision/verbal cueing Rolling Left: Supervision/Verbal cueing Supine to Sit: Contact Guard/Touching assist Sit to Supine: Moderate Assistance - Patient 50-74% Transfers Lateral/Scoot Transfers: Moderate Assistance - Patient 50-74% Transfer (Assistive device): Other (Comment)(slideboard) Locomotion  Gait Ambulation: No Gait Gait: No Stairs / Additional Locomotion Stairs: No Architect: Yes Wheelchair Assistance: Chartered loss adjuster: Both upper extremities Wheelchair Parts Management: Needs assistance Distance: 120f  Trunk/Postural Assessment  Cervical Assessment Cervical Assessment: Within Functional Limits Thoracic Assessment Thoracic Assessment: Within Functional Limits Lumbar Assessment Lumbar Assessment: Exceptions to WFL(posterior pelvic tilt) Postural Control Postural Control: Deficits on evaluation  Balance Balance Balance Assessed: Yes Static Sitting Balance Static Sitting - Balance Support: Bilateral upper extremity supported Static Sitting - Level of Assistance: 5: Stand by assistance(supervision) Dynamic Sitting Balance Dynamic Sitting - Balance Support: Bilateral upper extremity  supported Dynamic Sitting - Level of Assistance: 5: Stand by assistance(supervision) Extremity Assessment  RLE Assessment RLE Assessment: Exceptions to WColumb[facility] Endoscopy Center IncGeneral Strength Comments: grossly generalized to 4/5 (hip flexion, add, abd) LLE Assessment LLE Assessment: Exceptions to WNorth Bay Medical CenterGeneral Strength Comments: grossly generalized to 4/5 (except ankle DF 3+/5)  Refer to Care Plan for Long Term Goals  Recommendations for other services: None   Discharge Criteria: Patient will be discharged from PT if patient refuses treatment 3 consecutive times without medical reason, if treatment goals not met, if there is a change in medical status, if patient makes no progress towards goals or if patient is discharged from hospital.  The above assessment, treatment plan, treatment alternatives and goals were discussed and mutually agreed upon: by patient  AAlfonse AlpersPT, DPT  07/26/2019, 7:33 AM

## 2019-07-26 NOTE — Progress Notes (Addendum)
Patient has order for I/O cath Q6 or PVR>300. Patient voided and scanned for PVR of 382m. This nurse only able to cath patient for 569m then scanned for 20265most-cath. Urine yellow and clear. No clots, but NT reported mucus-like discharge in urine when voiding. No signs of distress. No reports of pain with urination.

## 2019-07-26 NOTE — Evaluation (Signed)
Occupational Therapy Assessment and Plan  Patient Details  Name: Brittany Russo MRN: [id number] Date of Birth: 1945/12/09  OT Diagnosis: acute pain, muscular wasting and disuse atrophy and muscle weakness (generalized) Rehab Potential: Rehab Potential (ACUTE ONLY): Good ELOS: 3-5 days   Today's Date: 07/26/2019 OT Individual Time: 4008-6761 OT Individual Time Calculation (min): 60 min     Problem List:  Patient Active Problem List   Diagnosis Date Noted  . Status post thoracentesis   . Lethargy   . Acute on chronic anemia   . Pleural effusion   . Supratherapeutic INR   . Urinary retention   . Acute lower UTI   . Radiation cystitis   . Labile blood pressure   . Labile blood glucose   . Anxiety state   . Controlled type 2 diabetes mellitus with hyperglycemia, with long-term current use of insulin ([place])   . Hyponatremia   . Thrombocytopenia ([place])   . Phantom limb pain ([place])   . Postoperative pain   . Amputation above knee ([place]) 07/04/2019  . S/P right knee disarticulation amputation 06/29/2019  . Above knee amputation of right lower extremity ([place]) 06/29/2019  . Hyperglycemia   . Failed total right knee replacement ([place])   . Hepatic encephalopathy ([place]) 12/28/2018  . UTI (urinary tract infection) 12/28/2018  . Bradycardia 12/28/2018  . Hyperkalemia 12/28/2018  . Hematuria 11/23/2018  . Overweight (BMI 25.0-29.9) 11/23/2018  . Mechanical problem with extremity 11/22/2018  . Cirrhosis of liver with ascites ([place]) 11/09/2018  . Nausea without vomiting 10/26/2018  . Ascites   . Rectal bleeding   . AKI (acute kidney injury) ([place]) 10/11/2018  . Lower GI bleed 10/10/2018  . Anasarca 10/10/2018  . Hypoalbuminemia 10/10/2018  . Depression 09/28/2018  . Acute hepatic encephalopathy 09/28/2018  . Cirrhosis ([place]) 09/28/2018  . Periprosthetic fracture around internal prosthetic right knee joint 09/24/2018  . S/P right TKA 09/22/2018  . Status post total knee replacement, right  09/22/2018  . DM2 (diabetes mellitus, type 2) ([place]) 07/11/2015  . Hypertension 07/11/2015  . Hyperlipidemia 07/11/2015    Past Medical History:  Past Medical History:  Diagnosis Date  . Acute and subacute hepatic failure without coma   . Allergy   . Anasarca   . Anxiety   . Arthritis   . Ascites 09/2018   no SBP on 850 cc tap 10/13/18  . Asthma    allery induced  . Cirrhosis of liver ([place]) ~ 2004   from NAFLD.  Dx ~ 2004.  Dr Dorcas Mcmurray at [facility]/    . Depression   . Diabetes mellitus without complication ([place])    type 2  . Eczema of both hands   . Endometrial cancer ([place]) 1996, 2003   hysterectomy 1996.  recurrence 2003, treated with radiation.    . Generalized edema   . GI hemorrhage   . 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 in [place]  . Hyperlipidemia   . Hypertension   . Muscle weakness   . Seizures ([place])    last seizure June 07, 2014   . Thrombocytopenia ([place])   . Thyroid nodule    Past Surgical History:  Past Surgical History:  Procedure Laterality Date  . AMPUTATION Right 06/29/2019   Procedure: AMPUTATION ABOVE KNEE through knee;  Surgeon: Paralee Cancel, MD;  Location: [facility];  Service: Orthopedics;  Laterality: Right;  2 hrs  . CATARACT EXTRACTION W/PHACO Left 06/04/2017   Procedure:  CATARACT EXTRACTION PHACO AND INTRAOCULAR LENS PLACEMENT (IOC);  Surgeon: Baruch Goldmann, MD;  Location: [facility];  Service: Ophthalmology;  Laterality: Left;  CDE: 3.90  . CATARACT EXTRACTION W/PHACO Right 07/15/2017   Procedure: CATARACT EXTRACTION PHACO AND INTRAOCULAR LENS PLACEMENT (IOC);  Surgeon: Baruch Goldmann, MD;  Location: [facility];  Service: Ophthalmology;  Laterality: Right;  CDE: 7.60  . COLONOSCOPY  06/2007   in [place]. for hematochzia.  radiation proctitis  . DILATION AND CURETTAGE OF UTERUS    . ESOPHAGOGASTRODUODENOSCOPY  2016  . ESOPHAGOGASTRODUODENOSCOPY (EGD) WITH PROPOFOL N/A 12/30/2018   Procedure: EGD;   Surgeon: Clarene Essex, MD;  [facility]: [facility];  Service: Endoscopy;  Laterality: N/A;  . EXCISIONAL TOTAL KNEE ARTHROPLASTY Right 01/03/2019   Procedure: EXCISIONAL TOTAL KNEE ARTHROPLASTY;  Surgeon: Paralee Cancel, MD;  [facility]: [facility];  Service: Orthopedics;  Laterality: Right;  . FLEXIBLE SIGMOIDOSCOPY N/A 12/30/2018   Procedure: FLEXIBLE SIGMOIDOSCOPY;  Surgeon: Clarene Essex, MD;  [facility];  Service: Endoscopy;  Laterality: N/A;  . IR PARACENTESIS  10/13/2018  . IR PARACENTESIS  07/21/2019  . IR THORACENTESIS ASP PLEURAL SPACE W/IMG GUIDE  07/19/2019  . STERIOD INJECTION Left 06/29/2019   Procedure: LEFT KNEE INJECTION;  Surgeon: Paralee Cancel, MD;  [facility];  Service: Orthopedics;  Laterality: Left;  . TOTAL ABDOMINAL HYSTERECTOMY  1996  . TOTAL KNEE ARTHROPLASTY Right 09/22/2018   Procedure: TOTAL KNEE ARTHROPLASTY;  Surgeon: Paralee Cancel, MD;  [facility];  Service: Orthopedics;  Laterality: Right;  70 mins  . TOTAL KNEE REVISION Right 09/27/2018   Procedure: TOTAL KNEE REVISION;  Surgeon: Paralee Cancel, MD;  [facility];  Service: Orthopedics;  Laterality: Right;  . TOTAL KNEE REVISION Right 11/22/2018   Procedure: repair right knee extensor mechanism;  Surgeon: Paralee Cancel, MD;  [facility]: [facility];  Service: Orthopedics;  Laterality: Right;  90 mins    Assessment & Plan Clinical Impression: Patient is a 74 y.o. with history of asthma, T2DM, depression/anxiety, right cirrhosis of the liver (NAFLD) with acute on chronic hepatic failure, GIBs,  failed R-TKR complicated by fall with distal  Femur fracture and infection. She has had pain with functional disability affecting QOL and elected to undergo R-AKA on 06/28/19 by Dr. Alvan Dame. Post op NWB and has been limited by fatigue, pain and hypoglycemia. ABLA and thrombocytopenia being monitored. Patient is being admitted to CIR.   Patient transferred to CIR on 07/04/2019 .    Patient discharged from therapy 4/27 due to  having met therapy goals and completing hands on family education with pt's daughter and husband.  Prior to d/c, medical issues arose which impacted pt discharge home.  Due to medical issues pt has required increased assistance with mobility and transfers.  Therefore therapy was reconsulted to allow for focus on transfers, OOB activity, and re-education with family prior to d/c home.  Patient currently requires mod with basic self-care skills secondary to muscle weakness and pain and decreased sitting balance, decreased standing balance, decreased balance strategies and impaired motor planning.  Prior to hospitalization, patient could complete ADLs with min-mod assist.  Patient will benefit from skilled intervention to decrease level of assist with basic self-care skills prior to discharge home with care partner.  Anticipate patient will require minimal physical assistance and moderate physical assestance and follow up home health.  OT - End of Session Activity Tolerance: Tolerates 30+ min activity with multiple rests Endurance Deficit: Yes Endurance Deficit Description: requires frequent rest breaks OT Assessment Rehab  Potential (ACUTE ONLY): Good OT Barriers to Discharge: Medical stability OT Patient demonstrates impairments in the following area(s): Balance;Endurance;Motor;Pain;Safety OT Basic ADL's Functional Problem(s): Grooming;Bathing;Toileting;Dressing OT Transfers Functional Problem(s): Toilet OT Additional Impairment(s): None OT Plan OT Intensity: Minimum of 1-2 x/day, 45 to 90 minutes OT Frequency: 5 out of 7 days OT Duration/Estimated Length of Stay: 3-5 days OT Treatment/Interventions: Balance/vestibular training;Discharge planning;Community reintegration;Disease Lawyer;Functional mobility training;Neuromuscular re-education;Pain management;Patient/family education;Psychosocial support;Self Care/advanced ADL retraining;Skin care/wound  managment;Splinting/orthotics;Therapeutic Activities;Therapeutic Exercise;UE/LE Strength taining/ROM;Wheelchair propulsion/positioning OT Basic Self-Care Anticipated Outcome(s): Min A OT Toileting Anticipated Outcome(s): Mod A OT Bathroom Transfers Anticipated Outcome(s): Mod A OT Recommendation Patient destination: Home Follow Up Recommendations: Home health OT;24 hour supervision/assistance Equipment Recommended: None recommended by OT Equipment Details: has all from previous stay   Skilled Therapeutic Intervention Pt re-evaluated due to medical issues and prolonged time in bed, which has increased the amount of assistance with mobility and transfers that pt requires.  Therefore therapy was reconsulted to allow for focus on transfers, OOB activity, and re-education with family prior to d/c home.   OT eval completed with discussion of rehab process, OT purpose, POC, ELOS, and goals.  ADL assessment completed at bed level, progressing to OOB with focus on increased independence and activity tolerance.  Bathing and dressing completed with pt able to complete bathing with min assist at bed level.  Pt required assistance with LB dressing, utilizes reacher to assist with donning pants.  Therapist educated on limb wrapping and rewrapped residual limb prior to LB dressing.  Pt required min assist for bed mobility and Min assist for sitting balance while therapist donned shoe with AFO.  Completed slide board transfer with therapist placing slide board, pt demonstrating good sequencing with weight shifting for placement of slide board and during transfer.  Pt completed transfer with min assist initially, increasing to mod assist as approaching w/c.  Engaged in grooming tasks from w/c level. Pt remained upright in w/c with seat belt alarm on and all needs in reach.  OT Evaluation Precautions/Restrictions  Precautions Precautions: Fall Precaution Comments: R AKA, likes to wear her L  AFO/shoe Restrictions Weight Bearing Restrictions: Yes RLE Weight Bearing: Non weight bearing Vital Signs Therapy Vitals Temp: 97.8 F (36.6 C) Temp Source: Oral Pulse Rate: 76 Resp: 14 BP: (!) 132/54 Patient Position (if appropriate): Lying Oxygen Therapy SpO2: 98 % O2 Device: Room Air Pain Pain Assessment Pain Scale: 0-10 Pain Score: 0-No pain Home Living/Prior Functioning Home Living Family/patient expects to be discharged to:: Private residence Living Arrangements: Spouse/significant other Available Help at Discharge: Family Type of Home: Apartment Home Access: Level entry, Elevator(lives on 3rd floor but able to take elevator) Home Layout: One level Bathroom Shower/Tub: Tub/shower unit(has tub transfer bench and wide drop arm BSC) Bathroom Toilet: Handicapped height Bathroom Accessibility: Yes Additional Comments: pt reports she has a CNA come to assist her from 10am-1pm every day. She assists with bathing, dressing. pt reports he daughter helps as well and her husband.   Lives With: Spouse IADL History Homemaking Responsibilities: No Prior Function Level of Independence: Requires assistive device for independence, Needs assistance with ADLs, Needs assistance with tranfers Bath: Moderate Toileting: Moderate Dressing: Moderate  Able to Take Stairs?: No Driving: No Comments: Pt has been non-ambulatory since July of 2020 after R TKA, needed supervision-min assist w/ slide board transfers. She did not slide board to/from car, used paid handicap transportation. She did practice sit<>stands to RW w/ therapy prior to admisison, but only reports "it did  not go well". Vision Baseline Vision/History: Wears glasses Wears Glasses: Reading only Patient Visual Report: No change from baseline Vision Assessment?: No apparent visual deficits Perception  Perception: Within Functional Limits Praxis Praxis: Intact Cognition Overall Cognitive Status: Within Functional Limits for  tasks assessed Arousal/Alertness: Awake/alert Orientation Level: Person;Place;Situation Person: Oriented Place: Oriented Situation: Oriented Year: 2021 Month: May Day of Week: Correct Memory: Appears intact Immediate Memory Recall: Sock;Blue;Bed Memory Recall Sock: Without Cue Memory Recall Blue: Without Cue Memory Recall Bed: Without Cue Attention: Selective Selective Attention: Appears intact Awareness: Appears intact Problem Solving: Appears intact Safety/Judgment: Appears intact Comments: pt is fearful of falling and anxious, increased fearfulness after not getting OOB much over past week since initial d/c from therapy Sensation Sensation Light Touch: Impaired Detail(intact in BUE) Peripheral sensation comments: decreased along incision on R residual limb Light Touch Impaired Details: Impaired RLE Proprioception: Appears Intact Coordination Gross Motor Movements are Fluid and Coordinated: No Fine Motor Movements are Fluid and Coordinated: Yes Coordination and Movement Description: grossly uncoordinated due to R AKA, generalized weakness, anxiety, and fatigue Finger Nose Finger Test: [facility] Motor  Motor Motor: Abnormal postural alignment and control Extremity/Trunk Assessment RUE Assessment RUE Assessment: Within Functional Limits LUE Assessment LUE Assessment: Within Functional Limits     Refer to Care Plan for Long Term Goals  Recommendations for other services: None    Discharge Criteria: Patient will be discharged from OT if patient refuses treatment 3 consecutive times without medical reason, if treatment goals not met, if there is a change in medical status, if patient makes no progress towards goals or if patient is discharged from hospital.  The above assessment, treatment plan, treatment alternatives and goals were discussed and mutually agreed upon: by patient  Simonne Come 07/26/2019, 8:59 AM

## 2019-07-26 NOTE — Telephone Encounter (Signed)
Please let the patient know that per Dr.Rehman they need to talk with the doctors at [facility] they should be able to answer this for him. Dr.Magod is the GI who did her procedure.

## 2019-07-26 NOTE — Plan of Care (Signed)
  Problem: RH SKIN INTEGRITY Goal: RH STG SKIN FREE OF INFECTION/BREAKDOWN Description: Patients skin will remain free from further infection or breakdown with Min assist. Outcome: Progressing Goal: RH STG MAINTAIN SKIN INTEGRITY WITH ASSISTANCE Description: STG Maintain Skin Integrity With min Assistance. Outcome: Progressing Goal: RH STG ABLE TO PERFORM INCISION/WOUND CARE W/ASSISTANCE Description: STG Able To Perform Incision/Wound Care With total Assistance from caregiver. Outcome: Progressing   Problem: RH SAFETY Goal: RH STG ADHERE TO SAFETY PRECAUTIONS W/ASSISTANCE/DEVICE Description: STG Adhere to Safety Precautions With mod I  Assistance/Device. Outcome: Progressing

## 2019-07-26 NOTE — Telephone Encounter (Signed)
Spoke with spouse stated they have already made an appointment with her doctor in [place]

## 2019-07-26 NOTE — Progress Notes (Signed)
PHYSICAL MEDICINE & REHABILITATION PROGRESS NOTE  Subjective/Complaints: Patient seen laying in bed this AM, working with therapies.  She states she slept well overnight. Discussed transfer with therapies. She was seen by hospitalist, notes reviewed, discussed with MD. She has questions regarding PVRs and voiding, discussed with patient and nursing.   ROS: Denies CP, SOB, N/V/D  Objective: Vital Signs: Blood pressure (!) 132/54, pulse 76, temperature 97.8 F (36.6 C), temperature source Oral, resp. rate 14, height 5' 5"  (1.651 m), weight 70 kg, SpO2 98 %. No results found. Recent Labs    07/24/19 0506  WBC 6.0  HGB 8.6*  HCT 27.4*  PLT 100*   Recent Labs    07/24/19 0506  NA 131*  K 3.8  CL 95*  CO2 26  GLUCOSE 185*  BUN 12  CREATININE 1.03*  CALCIUM 8.2*    Physical Exam: BP (!) 132/54 (BP Location: Right Arm)   Pulse 76   Temp 97.8 F (36.6 C) (Oral)   Resp 14   Ht 5' 5"  (1.651 m)   Wt 70 kg   SpO2 98%   BMI 25.68 kg/m   Constitutional: No distress . Vital signs reviewed. HENT: Normocephalic.  Atraumatic. Eyes: EOMI. No discharge. Cardiovascular: No JVD. Respiratory: Normal effort.  No stridor. GI: Distended. Skin: Right AKA with serosanguinous drainage.  Psych: Normal mood.  Normal behavior. Musc: Bilateral lower extremity edema  Neuro: Alert Motor:  Left lower extremity: Hip flexion, knee extension 4/5, ankle dorsiflexion tight heelcord, but able to dorsiflex, 4-/5 ADF, stable Right lower extremity: Hip flexion 4/5 (some pain inhibition), slow improvement, improving  Assessment/Plan: 1. Functional deficits secondary to right AKA which require 3+ hours per day of interdisciplinary therapy in a comprehensive inpatient rehab setting.  Physiatrist is providing close team supervision and 24 hour management of active medical problems listed below.  Physiatrist and rehab team continue to assess barriers to discharge/monitor patient progress  toward functional and medical goals  Care Tool:  Bathing    Body parts bathed by patient: Right arm, Left arm, Chest, Abdomen, Front perineal area, Right upper leg, Left upper leg, Face, Buttocks   Body parts bathed by helper: Buttocks, Left upper leg Body parts n/a: Right lower leg   Bathing assist Assist Level: Minimal Assistance - Patient > 75%     Upper Body Dressing/Undressing Upper body dressing   What is the patient wearing?: Pull over shirt    Upper body assist Assist Level: Set up assist    Lower Body Dressing/Undressing Lower body dressing      What is the patient wearing?: Pants     Lower body assist Assist for lower body dressing: Moderate Assistance - Patient 50 - 74%     Toileting Toileting    Toileting assist Assist for toileting: Moderate Assistance - Patient 50 - 74% Assistive Device Comment: BSC in bathroom   Transfers Chair/bed transfer  Transfers assist  Chair/bed transfer activity did not occur: Safety/medical concerns  Chair/bed transfer assist level: Contact Guard/Touching assist(slideboard)     Locomotion Ambulation   Ambulation assist   Ambulation activity did not occur: Safety/medical concerns(R AKA, L LE and generalized weakness, decreased balance/postural control, OA, and osteoperosis)          Walk 10 feet activity   Assist  Walk 10 feet activity did not occur: Safety/medical concerns(R AKA, L LE and generalized weakness, decreased balance/postural control, OA, and osteoperosis)        Walk 50 feet activity  Assist Walk 50 feet with 2 turns activity did not occur: Safety/medical concerns(R AKA, L LE and generalized weakness, decreased balance/postural control, OA, and osteoperosis)         Walk 150 feet activity   Assist Walk 150 feet activity did not occur: Safety/medical concerns(R AKA, L LE and generalized weakness, decreased balance/postural control, OA, and osteoperosis)         Walk 10 feet on  uneven surface  activity   Assist Walk 10 feet on uneven surfaces activity did not occur: Safety/medical concerns(R AKA, L LE and generalized weakness, decreased balance/postural control, OA, and osteoperosis)         Wheelchair     Assist Will patient use wheelchair at discharge?: Yes Type of Wheelchair: Manual    Wheelchair assist level: Independent Max wheelchair distance: 165f    Wheelchair 50 feet with 2 turns activity    Assist        Assist Level: Independent   Wheelchair 150 feet activity     Assist     Assist Level: Independent      Medical Problem List and Plan: 1.  Impaired function, ADLs and mobility secondary to R AKA due to failed TKRs  Continue CIR  Team conference today to discuss current and goals and coordination of care, home and environmental barriers, and discharge planning with nursing, case manager, and therapies.   Appreciate hospitalist recs, discussed with MD, signed off 2.  Antithrombotics: -DVT/anticoagulation:  Cannot do lovenox due to low platelets             -antiplatelet therapy: N/A 3. Pain Management: Tramadol 50 mg qid with robaxin/Hydrocodone prn  Phantom limb pain    Gabapentin 100 BID started on 4/13, changed to 300 mg nightly on 4/15, changed to twice daily on 4/19, tolerating  Controlled with meds on 5/5 4. Mood: LCSW to follow for evaluation and support.              -antipsychotic agents: N/A 5. Neuropsych: This patient is ?fully capable of making decisions on her own behalf.  Discussed admission with neuropsych, appreciate evaluation and recs 6. Skin/Wound Care: Monitor wound for healing once surgical dressing removed.   ABD pad, with Kerlix and Ace dressings to AKA, continue for time being. 7. Fluids/Electrolytes/Nutrition: Monitor I/Os 8. Cirrhosis of the liver:   Appreciate GI recs, discussed with hospitalist further recs  IV Lasix 20 mg daily, increased to 40 mg, transition to 20 p.o. on 5/2  Status  post paracentesis on 4/30 removing 1.4 L of fluid Filed Weights   07/23/19 0410 07/24/19 0500 07/25/19 0500  Weight: 66.5 kg 66.9 kg 70 kg   ?  Reliability on 5/4, no weights for today  Plan for outpatient follow up with specialist 9. ABLA:   Hemoglobin 8.6 on 5/3, labs ordered for tomorrow  S/p transfusion on 4/27  Flex/Sig in 2-3 weeks  Continue to monitor 10. Chronic Thrombocytopenia: Monitor for signs of bleeding. Baseline at 74-->51.   Platelets 100 on 5/3, labs ordered for tomorrow.   See #8  Continue to monitor 11. Chronic hyponatremia: Continue to monitor.   Sodium 131 on 5/3  Continue to monitor 12. T2DM with hyperglycemia: Hgb A1c-5.2.   Glucotrol 2.5 twice daily, discussed with pharmacy, DCed on 4/23  Continue Tradjenta 5 mg daily  Continue metformin   Lantus 20 units daily, decreased to 15 units on 4/18, discussed with pharmacy, DClearviewon 4/23  Labile and elevated on 5/5 CBG (last 3)  Recent Labs    07/25/19 1727 07/25/19 2115 07/26/19 0612  GLUCAP 179* 195* 143*  13. H/o depression: ON Lexapro, Xanax and Wellbutrin. 14.  Labile blood pressure  Controlled on 5/5 15.  Radiation cystitis-   Appreciate Urology recs - CBI d/ced.    May need to restart CBI  Repeat UA with large Hb on 4/29, again on 4/30  D/ced foley, voiding  PVRs showing higher volumes, likely related to ascites.  16. Acute lower UTI  UA +, Ucx >100K Citrobacter  Completed Macrobid on 4/28  Repeat UA suggests infection, urine culture ordered- remains pending  Fosfomycin on 5/1 17. Supratherapeutic INR  INR 1.4 on 5/3  After Vit K and FFP on 4/28 18. Pleural effusion  Seen on CXR, personally reviewed, repeat personally reviewed, improved  S/p thoracentesis on 4/29, removing 1.2 L of fluid  Cultures reactive mesothelial cells  LOS: 22 days A FACE TO FACE EVALUATION WAS PERFORMED  Ladon Heney Lorie Phenix 07/26/2019, 8:33 AM

## 2019-07-26 NOTE — Plan of Care (Signed)
  Problem: RH Balance Goal: LTG: Patient will maintain dynamic sitting balance (OT) Description: LTG:  Patient will maintain dynamic sitting balance with assistance during activities of daily living (OT) 07/26/2019 1213 by Kerrie Buffalo, OT Flowsheets (Taken 07/26/2019 1213) LTG: Pt will maintain dynamic sitting balance during ADLs with: Contact Guard/Touching assist 07/26/2019 1213 by Kerrie Buffalo, OT Reactivated   Problem: RH Toileting Goal: LTG Patient will perform toileting task (3/3 steps) with assistance level (OT) Description: LTG: Patient will perform toileting task (3/3 steps) with assistance level (OT)  07/26/2019 1213 by Kerrie Buffalo, OT Flowsheets (Taken 07/26/2019 1213) LTG: Pt will perform toileting task (3/3 steps) with assistance level: Maximal Assistance - Patient 25 - 49% 07/26/2019 1213 by Kerrie Buffalo, OT Reactivated   Problem: RH Toilet Transfers Goal: LTG Patient will perform toilet transfers w/assist (OT) Description: LTG: Patient will perform toilet transfers with assist, with/without cues using equipment (OT) 07/26/2019 1213 by Kerrie Buffalo, OT Flowsheets (Taken 07/26/2019 1213) LTG: Pt will perform toilet transfers with assistance level of: Moderate Assistance - Patient 50 - 74% 07/26/2019 1213 by Kerrie Buffalo, OT Reactivated

## 2019-07-26 NOTE — Patient Care Conference (Signed)
Inpatient RehabilitationTeam Conference and Plan of Care Update Date: 07/26/2019   Time: 11:55 AM    Patient Name: [place] Record Number: [id number]  Date of Birth: 1945/11/17 Sex: Female         Room/Bed: 4M13C/4M13C-01 Payor Info: Payor: [employer] / Plan: Electrical engineer / Product Type: *No Product type* /    Admit Date/Time:  07/04/2019  1:58 PM  Primary Diagnosis:  Amputation above knee [facility])  Patient Active Problem List   Diagnosis Date Noted  . Status post thoracentesis   . Lethargy   . Acute on chronic anemia   . Pleural effusion   . Supratherapeutic INR   . Urinary retention   . Acute lower UTI   . Radiation cystitis   . Labile blood pressure   . Labile blood glucose   . Anxiety state   . Controlled type 2 diabetes mellitus with hyperglycemia, with long-term current use of insulin ([place])   . Hyponatremia   . Thrombocytopenia ([place])   . Phantom limb pain ([place])   . Postoperative pain   . Amputation above knee ([place]) 07/04/2019  . S/P right knee disarticulation amputation 06/29/2019  . Above knee amputation of right lower extremity ([place]) 06/29/2019  . Hyperglycemia   . Failed total right knee replacement ([place])   . Hepatic encephalopathy ([place]) 12/28/2018  . UTI (urinary tract infection) 12/28/2018  . Bradycardia 12/28/2018  . Hyperkalemia 12/28/2018  . Hematuria 11/23/2018  . Overweight (BMI 25.0-29.9) 11/23/2018  . Mechanical problem with extremity 11/22/2018  . Cirrhosis of liver with ascites ([place]) 11/09/2018  . Nausea without vomiting 10/26/2018  . Ascites   . Rectal bleeding   . AKI (acute kidney injury) ([place]) 10/11/2018  . Lower GI bleed 10/10/2018  . Anasarca 10/10/2018  . Hypoalbuminemia 10/10/2018  . Depression 09/28/2018  . Acute hepatic encephalopathy 09/28/2018  . Cirrhosis ([place]) 09/28/2018  . Periprosthetic fracture around internal prosthetic right knee joint 09/24/2018  . S/P right TKA 09/22/2018  . Status post total knee replacement,  right 09/22/2018  . DM2 (diabetes mellitus, type 2) ([place]) 07/11/2015  . Hypertension 07/11/2015  . Hyperlipidemia 07/11/2015    Expected Discharge Date: Expected Discharge Date: (Anticipate DC in the next 2-3 days)  Team Members Present: Physician leading conference: Dr. Delice Lesch Care Coodinator Present: Nestor Lewandowsky, RN, BSN, CRRN;Christina Sampson Goon, BSW;Genie Breckin [place], RN, MSN Nurse Present: Debroah Loop, RN PT Present: Becky Sax, PT OT Present: Simonne Come, OT SLP Present: Weston Anna, SLP PPS Coordinator present : Ileana Ladd, Burna Mortimer, SLP     Current Status/Progress Goal Weekly Team Focus  Bowel/Bladder   continent of bowel and bladder; lbm 5/4; I/O q6 or pvr>300 (retention)  remain continent; empty bladder  assess q shift/prn   Swallow/Nutrition/ Hydration             ADL's   Min A bathing, Mod A LB dressing, CGA sitting balance, Min-mod A slide board transfers  Mod A toilet transfers and toileting, CGA sitting balance  transfer training, family education   Mobility   bed mobility min/mod A, slideboard transfers mod A, supervision WC mobility  CGA transfers, mod I w/c mobility  functional mobility/transfers, amputee education, dynamic sitting/standing balance, endurance, energy conservation strategies   Communication             Safety/Cognition/ Behavioral Observations            Pain   c/o phantom pain R stump  pain < 3  assess q shift/prn   Skin   R AKA (staples removed); daily dressing change (ABD, kerlix, ACE wrap)  prevent further infection/breakdown  assess skin q shift/prn    Rehab Goals Patient on target to meet rehab goals: Yes Rehab Goals Revised: Therapy discharged 07/19/19 with noted functional decline during medical issues being addressed; therapy restarted 07/26/19 *See Care Plan and progress notes for long and short-term goals.     Barriers to Discharge  Current Status/Progress Possible Resolutions Date Resolved   Nursing                   PT  Wound Care;Behavior  anxiety, fear of falling              OT Medical stability                SLP                SW Medical stability Discharge for 07/19/19 put on hold due to medical issues Has DME, HH secured, Transportation for discharge secured; await medical clearance and discharge orders          Discharge Planning/Teaching Needs:  Home with spouse and daughters and CNA 10-1p 5x/week  Wound care, transfers, toileting, medications, etc   Team Discussion: MD med stable.  RN BM yesterday, pain relief with norco and tramadol, no blood in urine.  OT saw today, min/mod SB transfers, fam ed needs to be done.  PT min/mod SB transfers, mod A car transfers, needs fam ed prior to DC.   Revisions to Treatment Plan: Anticipate DC in the next 2-3 days depending on therapy ability to do fam ed and continued medical stability.     Medical Summary Current Status: Impaired function, ADLs and mobility secondary to R AKA due to failed TKRs Weekly Focus/Goal: Improve mobility, phantom limb pain, cirrhosis, CBGs, radiation cyctitis  Barriers to Discharge: Medical stability;Weight;Wound care;Weight bearing restrictions   Possible Resolutions to Barriers: Therapies, optimize DM meds, follow labs   Continued Need for Acute Rehabilitation Level of Care: The patient requires daily medical management by a physician with specialized training in physical medicine and rehabilitation for the following reasons: Direction of a multidisciplinary physical rehabilitation program to maximize functional independence : Yes Medical management of patient stability for increased activity during participation in an intensive rehabilitation regime.: Yes Analysis of laboratory values and/or radiology reports with any subsequent need for medication adjustment and/or medical intervention. : Yes   I attest that I was present, lead the team conference, and concur with the assessment and plan of the  team.   Retta Diones 07/26/2019, 3:54 PM   Team conference was held via web/ teleconference due to [place] - 19

## 2019-07-26 NOTE — Progress Notes (Signed)
Occupational Therapy Session Note  Patient Details  Name: Brittany Russo MRN: [id number] Date of Birth: 09-29-1945  Today's Date: 07/26/2019 OT Individual Time: 1357-1500 OT Individual Time Calculation (min): 63 min    Short Term Goals: Week 1:  OT Short Term Goal 1 (Week 1): Focus on family education in preparation for d/c OT Short Term Goal 1 - Progress (Week 1): Met OT Short Term Goal 2 (Week 1): Pt will complete LB dressing with mod assist with lateral leans vs sit > stand OT Short Term Goal 2 - Progress (Week 1): Met OT Short Term Goal 3 (Week 1): Pt will complete toileting tasks with mod assist OT Short Term Goal 3 - Progress (Week 1): Progressing toward goal  Skilled Therapeutic Interventions/Progress Updates:  Patient met seated on DABSC with RN and NT present. Patient in agreement with OT treatment session with focus on self-care re-education and functional transfers with use of SB as detailed below. Patient unable to to complete seated push-up from [facility] for clothing management. Patient also unable to complete sit to stand with Max A +1 long enough for therapist to hike pants over hips. SB transfer to wc on R and return to supine with Mod A. Patient able to successful complete clothing management in supine rolling R<>L. Rest breaks throughout treatment session 2/2 fatigue. Return to wc with patient requesting hair washing at sink level. OT completed hair washing with total A. Patient able to doff UB clothing and don clean shirt with set-up assist. Return to supine via SB transfer with Min A initially and Mod A for completion. Session concluded with patient lying supine in bed with call bell within reach, bed alarm activated, and all needs met.    Therapy Documentation Precautions:  Precautions Precautions: Fall Precaution Comments: R AKA, likes to wear her L AFO/shoe Restrictions Weight Bearing Restrictions: Yes RLE Weight Bearing: Non weight bearing  Therapy/Group: Individual  Therapy  Mikaelyn Arthurs [name] 07/26/2019, 8:40 AM

## 2019-07-27 ENCOUNTER — Ambulatory Visit (HOSPITAL_COMMUNITY): Payer: Medicare Other

## 2019-07-27 ENCOUNTER — Encounter (HOSPITAL_COMMUNITY): Payer: Medicare Other | Admitting: Occupational Therapy

## 2019-07-27 ENCOUNTER — Inpatient Hospital Stay (HOSPITAL_COMMUNITY): Payer: Medicare Other

## 2019-07-27 ENCOUNTER — Inpatient Hospital Stay (HOSPITAL_COMMUNITY): Payer: Medicare Other | Admitting: Occupational Therapy

## 2019-07-27 LAB — CBC
HCT: 29.1 % — ABNORMAL LOW (ref 36.0–46.0)
Hemoglobin: 9 g/dL — ABNORMAL LOW (ref 12.0–15.0)
MCH: 29.4 pg (ref 26.0–34.0)
MCHC: 30.9 g/dL (ref 30.0–36.0)
MCV: 95.1 fL (ref 80.0–100.0)
Platelets: 106 10*3/uL — ABNORMAL LOW (ref 150–400)
RBC: 3.06 MIL/uL — ABNORMAL LOW (ref 3.87–5.11)
RDW: 17.9 % — ABNORMAL HIGH (ref 11.5–15.5)
WBC: 5.8 10*3/uL (ref 4.0–10.5)
nRBC: 0 % (ref 0.0–0.2)

## 2019-07-27 LAB — GLUCOSE, CAPILLARY
Glucose-Capillary: 202 mg/dL — ABNORMAL HIGH (ref 70–99)
Glucose-Capillary: 290 mg/dL — ABNORMAL HIGH (ref 70–99)
Glucose-Capillary: 181 mg/dL — ABNORMAL HIGH (ref 70–99)
Glucose-Capillary: 249 mg/dL — ABNORMAL HIGH (ref 70–99)

## 2019-07-27 LAB — PROTIME-INR
INR: 1.4 — ABNORMAL HIGH (ref 0.8–1.2)
Prothrombin Time: 16.9 seconds — ABNORMAL HIGH (ref 11.4–15.2)

## 2019-07-27 MED ORDER — FUROSEMIDE 20 MG PO TABS: 20.0000 mg | ORAL_TABLET | Freq: Two times a day (BID) | ORAL | 0 refills | Status: DC

## 2019-07-27 MED ORDER — TRAMADOL HCL 50 MG PO TABS: 50.0000 mg | ORAL_TABLET | Freq: Four times a day (QID) | ORAL | 0 refills | Status: DC | PRN

## 2019-07-27 MED ORDER — FUROSEMIDE 20 MG PO TABS
20.0000 mg | ORAL_TABLET | Freq: Two times a day (BID) | ORAL | Status: DC
  Administered 2019-07-27 – 2019-07-28 (×2): 20 mg via ORAL
  Filled 2019-07-27 (×2): qty 1

## 2019-07-27 MED ORDER — POTASSIUM CHLORIDE CRYS ER 10 MEQ PO TBCR
10.0000 meq | EXTENDED_RELEASE_TABLET | Freq: Every day | ORAL | Status: DC
  Administered 2019-07-27 – 2019-07-28 (×2): 10 meq via ORAL
  Filled 2019-07-27 (×2): qty 1

## 2019-07-27 MED ORDER — FERROUS SULFATE 325 (65 FE) MG PO TABS: 325.0000 mg | ORAL_TABLET | Freq: Three times a day (TID) | ORAL | 0 refills | Status: DC

## 2019-07-27 MED ORDER — POTASSIUM CHLORIDE ER 10 MEQ PO TBCR: 10.0000 meq | EXTENDED_RELEASE_TABLET | Freq: Every day | ORAL | 0 refills | Status: DC

## 2019-07-27 MED ORDER — SPIRONOLACTONE 100 MG PO TABS: 100.0000 mg | ORAL_TABLET | Freq: Every day | ORAL | 0 refills | Status: DC

## 2019-07-27 MED ORDER — GABAPENTIN 300 MG PO CAPS: 300.0000 mg | ORAL_CAPSULE | Freq: Two times a day (BID) | ORAL | 0 refills | Status: DC

## 2019-07-27 MED ORDER — LACTULOSE 10 GM/15ML PO SOLN: 15.0000 g | Freq: Two times a day (BID) | ORAL | 0 refills | Status: DC

## 2019-07-27 MED ORDER — MESALAMINE 1000 MG RE SUPP: 1000.0000 mg | Freq: Every day | RECTAL | 0 refills | Status: DC

## 2019-07-27 MED ORDER — GERHARDT'S BUTT CREAM: 1.0000 "application " | TOPICAL_CREAM | Freq: Four times a day (QID) | CUTANEOUS | Status: DC

## 2019-07-27 MED ORDER — METHOCARBAMOL 500 MG PO TABS: 500.0000 mg | ORAL_TABLET | Freq: Four times a day (QID) | ORAL | 0 refills | Status: DC | PRN

## 2019-07-27 MED ORDER — POLYETHYLENE GLYCOL 3350 17 G PO PACK
17.0000 g | PACK | Freq: Every day | ORAL | 0 refills | Status: DC | PRN
Start: 2019-07-27 — End: 2019-09-08

## 2019-07-27 MED ORDER — HYDROCODONE-ACETAMINOPHEN 7.5-325 MG PO TABS: 1.0000 | ORAL_TABLET | Freq: Two times a day (BID) | ORAL | 0 refills | Status: DC | PRN

## 2019-07-27 NOTE — Progress Notes (Signed)
Occupational Therapy Session Note  Patient Details  Name: Brittany Russo MRN: [id number] Date of Birth: 10-13-1945  Today's Date: 07/27/2019 OT Individual Time: 1100-1200 OT Individual Time Calculation (min): 60 min   OT Individual Time: 1350-1445 OT Individual Time Calculation (min): 55 min   Short Term Goals: Week 1:  OT Short Term Goal 1 (Week 1): Focus on family education in preparation for d/c OT Short Term Goal 1 - Progress (Week 1): Met OT Short Term Goal 2 (Week 1): Pt will complete LB dressing with mod assist with lateral leans vs sit > stand OT Short Term Goal 2 - Progress (Week 1): Met OT Short Term Goal 3 (Week 1): Pt will complete toileting tasks with mod assist OT Short Term Goal 3 - Progress (Week 1): Progressing toward goal  Skilled Therapeutic Interventions/Progress Updates:  Session 1: Patient met seated in wc in agreement with OT treatment session with focus on functional transfers and self-care re-education as detailed below. Patient indicating need to void. SB transfer bariatric DABSC <> EOB with Min-Mod A after placement of board with occasional cueing for hand placement and head/hip relationship x multiple trials. Patient able to manage clothing with lateral weight shifts R<>L but required assistance for completion of task and to pull down incontinence brief. Patient voided bladder with ability to complete front perineal hygiene in sitting. OT assisted patient with donning new brief in supine with patient able to hike pants over hips. Return to wc via SB with Min-Mod A and wc mobility to hospital [facility]. Session concluded with patient in bed with bed in chair position, bed alarm activated, call bell within reach, and all needs met.   Session 2: Afternoon treatment session with focus on family education in prep for safe d/c home as detailed below. Daughter present at bedside with questions about wc. OT communication to PT and case manager on daughters concerns. OT provided  education on toileting/hygiene/clothing management with recommendation for clothing management and hygiene in sitting. Patient able to complete SB transfer for simulated toileting task with daughter from supine to bariatric DABSC with cueing for hand placement and head/hip relationship. Patient able to pull pants down to mid thigh but required assistance from daughter to get pants down the rest of the way. OT provided education on blocking LLE in prep for SB transfers to prevent patient from losing footing. OT also provided education on patient completing seated push-up for hygiene management after BM and returning to supine for clothing management for safety,  energy conservation, and to decrease caregiver burden. Session concluded with patient seated on [facility] with PT and patient's daughter present for family education.   Therapy Documentation Precautions:  Precautions Precautions: Fall Precaution Comments: R AKA, likes to wear her L AFO/shoe Restrictions Weight Bearing Restrictions: Yes RLE Weight Bearing: Non weight bearing  Therapy/Group: Individual Therapy  [name] [name] 07/27/2019, 7:57 AM

## 2019-07-27 NOTE — Progress Notes (Signed)
Occupational Therapy Discharge Summary  Patient Details  Name: Brittany Russo MRN: [id number] Date of Birth: 04/16/1945  Patient has met 3 of 3 long term goals due to improved activity tolerance, improved balance, postural control, ability to compensate for deficits and improved awareness. Patient to discharge at overall Min-Max assist level. Patient's care partner is independent to provide the necessary physical and cognitive assistance at discharge. Patient's daughter completed hands on education with focus on toilet transfers with use of SB, toileting/hygiene/clothing management seated on BSC and in supine, and recommendation to refrain from standing patient secondary to orthopedic issues in LLE. Patient's daughter demonstrated good awareness and ability to assist with slide board transfers.  Reasons goals not met: N/A  Recommendation: Patient will benefit from ongoing skilled OT services in home health setting to continue to advance functional skills in the area of BADL and Reduce care partner burden   Equipment: Wide arm BSC   Reasons for discharge: treatment goals met and discharge from hospital  Patient/family agrees with progress made and goals achieved: Yes  OT Discharge Precautions/Restrictions  Precautions Precautions: Fall Precaution Comments: R AKA, likes to wear her L AFO/shoe Restrictions Weight Bearing Restrictions: Yes RLE Weight Bearing: Non weight bearing Vital Signs Therapy Vitals Temp: 98.4 F (36.9 C) Pulse Rate: 89 Resp: 16 BP: (!) 124/55 Patient Position (if appropriate): Sitting Oxygen Therapy SpO2: 97 % O2 Device: Room Air ADL ADL Eating: Independent Grooming: Modified independent Upper Body Bathing: Setup Where Assessed-Upper Body Bathing: Sitting at sink Lower Body Bathing: Minimal assistance Where Assessed-Lower Body Bathing: Bed level Upper Body Dressing: Setup Where Assessed-Upper Body Dressing: Sitting at sink Lower Body Dressing:  Moderate assistance Where Assessed-Lower Body Dressing: Bed level Toileting: Moderate assistance, Maximal assistance Where Assessed-Toileting: Bedside Commode, Bed level(uses bedpan and BSC) Toilet Transfer: Moderate assistance Toilet Transfer Method: Theatre manager: Extra wide drop arm bedside commode Vision Baseline Vision/History: Wears glasses Wears Glasses: Reading only Patient Visual Report: No change from baseline Vision Assessment?: No apparent visual deficits Perception  Perception: Within Functional Limits Praxis Praxis: Intact Cognition Overall Cognitive Status: Within Functional Limits for tasks assessed Arousal/Alertness: Awake/alert Orientation Level: Oriented X4 Attention: Selective Selective Attention: Appears intact Memory: Appears intact Awareness: Appears intact Problem Solving: Appears intact Safety/Judgment: Appears intact Comments: pt is fearful of falling and anxious, frequently shaking Sensation Sensation Light Touch: Impaired by gross assessment Peripheral sensation comments: absent along incision on R residual limb, decreased along L1-2 dermatome on R. WFL on L LE. Proprioception: Impaired by gross assessment Additional Comments: Pt reports constant numbness on R residual limb and foot drop on L LE Coordination Gross Motor Movements are Fluid and Coordinated: No Fine Motor Movements are Fluid and Coordinated: Yes Coordination and Movement Description: grossly uncoordinated due to R AKA, generalized weakness, anxiety, and fatigue Finger Nose Finger Test: [facility] Heel Shin Test: unable to perform on R LE due to AKA, WFL on L LE Motor  Motor Motor: Abnormal postural alignment and control Motor - Skilled Clinical Observations: grossly uncoordinated due to R AKA, generalized weakness, anxiety, and fatigue. Wears L AFO due to foot drop Motor - Discharge Observations: grossly uncoordinated due to R AKA, generalized weakness, anxiety,  and fatigue. Wears L AFO due to foot drop Mobility  Bed Mobility Bed Mobility: Rolling Right;Rolling Left;Supine to Sit;Sit to Supine Rolling Right: Independent with assistive device Rolling Left: Independent with assistive device Supine to Sit: Supervision/Verbal cueing Sit to Supine: Supervision/Verbal cueing Transfers Sit to Stand: Moderate Assistance -  Patient 50-74%  Trunk/Postural Assessment  Cervical Assessment Cervical Assessment: Within Functional Limits Thoracic Assessment Thoracic Assessment: Within Functional Limits Lumbar Assessment Lumbar Assessment: Exceptions to WFL(posterior pelvic tilt.) Postural Control Postural Control: Deficits on evaluation  Balance Balance Balance Assessed: Yes Static Sitting Balance Static Sitting - Balance Support: Bilateral upper extremity supported Static Sitting - Level of Assistance: 6: Modified independent (Device/Increase time) Dynamic Sitting Balance Dynamic Sitting - Balance Support: Bilateral upper extremity supported Dynamic Sitting - Level of Assistance: 5: Stand by assistance Sitting balance - Comments: pt reliant on UE support  Static Standing Balance Static Standing - Balance Support: Bilateral upper extremity supported(parallel bars) Static Standing - Level of Assistance: 3: Mod assist Extremity/Trunk Assessment RUE Assessment RUE Assessment: Within Functional Limits LUE Assessment LUE Assessment: Within Functional Limits   Murtaza Shell [name] 07/27/2019, 7:57 AM

## 2019-07-27 NOTE — Progress Notes (Signed)
Physical Therapy Session Note  Patient Details  Name: Brittany Russo MRN: [id number] Date of Birth: [birth year]  Today's Date: 07/27/2019 PT Individual Time: [phone] and [id number] PT Individual Time Calculation (min): 58 min and 39 min  Short Term Goals: Week 1:  STG=LTG due to LOS with emphasis on education  Skilled Therapeutic Interventions/Progress Updates:   Treatment Session 1: 0900-0958 58 min Received pt supine in bed, pt agreeable to therapy, and denied any pain during start of session. Session focused on discharge planning, amputee education, functional mobility/transfers, UE/LE strength, dynamic sitting/standing balance, and improved activity tolerance. Pt rolled to L and R independently using bedrails. Pt donned pants in supine with min A and donned L ted hose total assist for time management purposes. Pt transferred supine<>sit and sit<>supine with supervision and use of bedrails with increased time. Donned L shoe/AFO total assist for time management. Pt transferred bed<>WC via slideboard min A. Pt required cues for anterior weight shifting, head/hips relationship, and hand placement on board. Pt performed WC mobility 116f using bilateral UEs independently to therapy gym. Pt transferred sit<>stand x 4 trials mod A in parallel bars. Pt with increased L knee pain and crepitus resulting in pt only able to remain standing approximately 15-20 seconds prior to needing to sit. Pt required cues for L knee extension and upright posture. Pt transferred WC<>mat and mat<>WC x 2 trials via slideboard with CGA. Pt worked on UE strengthening performing the following exercises seated on mat with supervision and verbal cues for technique:  -trunk rotation with 4.4lb medicine ball x8 bilaterally -chest press with 4/4lb medicine ball x10 -bicep curls with 3lb dumbbells x10 bilaterally   -overhead chest press with 3lb dowel 2x10 [facility]. Concluded session with pt  sitting in WC, needs within reach, and seatbelt alarm on. Therapist provided fresh ice water to patient.   Treatment Session 2: 1447-1526 39 min Received pt on bedside commode with OT, hand over to PT to assist with transfer. Pt agreeable to therapy, and did not state pain level during session. Pt's daughter present for family education training. Session focused on discharge planning, amputee education, functional mobility/transfers, UE/LE strength, dynamic sitting balance, and improved activity tolerance. Pt transferred bedside commode<>WC via slideboard with mod A. Pt's daughter doing majority of transfer and PT providing assist to stabilize slideboard and WC (educated pt's daughter on having pt's husband stabilize equipment at home if needed). Therapist educated pt/pt's daughter on body mechanics, hand positioning, and technique for safe transfer; pt's daughter verbalized and demonstrated understanding. Therapist educated pt/pt's daughter on energy conservation strategies. Verbally talked through car transfer, as pt's daughter performed one with pt during last family education training and verbalized/demonstrated confidence with transfer. Pt currently too fatigued to perform any additional transfers during session. Therapist educated pt/pt's daughter on R residual limb care per RN and MD instructions including steps for wrapping limb using abdominal pad, Kerlix, tape, and either applying ace wrap or shrinker when it arrives; as pt still bleeding from incision site. Provided pt with a few materials for limb care. Pt's daughter with questions regarding broken piece on WC. Therapist explained/demonstrated to pt's daughter how to attach/detach L armrest by squeezing the peg into the hole with ease, despite it being broken, as pt's daughter reporting having to hit it to get it to fit in place. Pt/pt's daughter with no further questions regarding mobility. Concluded session with pt sitting in WC, needs within reach,  and seatbelt alarm  on.   Therapy Documentation Precautions:  Precautions Precautions: Fall Precaution Comments: R AKA, likes to wear her L AFO/shoe Restrictions Weight Bearing Restrictions: Yes RLE Weight Bearing: Non weight bearing   Therapy/Group: Individual Therapy Alfonse Alpers PT, DPT   07/27/2019, 7:27 AM

## 2019-07-27 NOTE — Progress Notes (Signed)
Physical Therapy Discharge Summary  Patient Details  Name: Brittany Russo MRN: [id number] Date of Birth: Jan 11, 1946  Patient has met 3 of 3 long term goals due to improved activity tolerance, improved balance, improved postural control, increased strength and improved coordination. Patient to discharge at a wheelchair level [place]. Patient's care partner is independent to provide the necessary physical assistance at discharge. Pt's husband and daughter attended family education training previously, and pt's daughter attended second family education training session today on 5/6. Pt's daughter verbalized and demonstrated confidence with transfers to ensure safe discharge home. Therapist educated pt's family on transfer techniques, energy conservation strategies, limb care/wrapping techniques, and equipment management. Pt/pt's daughter with no further questions regarding mobility.   All goals met   Recommendation:  Patient will benefit from ongoing skilled PT services in home health setting to continue to advance safe functional mobility, address ongoing impairments in transfers, UE/LE strength, dynamic sitting/standing balance/coordination, endurance, and to minimize fall risk.  Equipment: No equipment provided  Reasons for discharge: treatment goals met and discharge from hospital  Patient/family agrees with progress made and goals achieved: Yes  PT Discharge Precautions/Restrictions Precautions Precautions: Fall Precaution Comments: R AKA, likes to wear her L AFO/shoe Restrictions Weight Bearing Restrictions: Yes RLE Weight Bearing: Non weight bearing Cognition Overall Cognitive Status: Within Functional Limits for tasks assessed Arousal/Alertness: Awake/alert Orientation Level: Oriented X4 Memory: Appears intact Awareness: Appears intact Problem Solving: Appears intact Safety/Judgment: Appears intact Comments: pt is fearful of falling and anxious, frequently  shaking Sensation Sensation Light Touch: Impaired by gross assessment Peripheral sensation comments: absent along incision on R residual limb, decreased along L1-2 dermatome on R. WFL on L LE. Proprioception: Impaired by gross assessment Additional Comments: Pt reports constant numbness on R residual limb and foot drop on L LE Coordination Gross Motor Movements are Fluid and Coordinated: No Fine Motor Movements are Fluid and Coordinated: Yes Coordination and Movement Description: grossly uncoordinated due to R AKA, generalized weakness, anxiety, and fatigue Finger Nose Finger Test: [facility] Heel Shin Test: unable to perform on R LE due to AKA, WFL on L LE Motor  Motor Motor: Abnormal postural alignment and control Motor - Skilled Clinical Observations: grossly uncoordinated due to R AKA, generalized weakness, anxiety, and fatigue. Wears L AFO due to foot drop  Mobility Bed Mobility Bed Mobility: Rolling Right;Rolling Left;Supine to Sit;Sit to Supine Rolling Right: Independent with assistive device Rolling Left: Independent with assistive device Supine to Sit: Supervision/Verbal cueing(bedrails) Sit to Supine: Supervision/Verbal cueing(bedrails) Transfers Transfers: Sit to Stand;Lateral/Scoot Transfers Sit to Stand: Moderate Assistance - Patient 50-74%(inside parallel bars) Lateral/Scoot Transfers: Minimal Assistance - Patient > 75%(slideboard) Transfer (Assistive device): Other (Comment)(slideboard) Locomotion  Gait Ambulation: No Gait Gait: No Stairs / Additional Locomotion Stairs: No Architect: Yes Wheelchair Assistance: Independent with Camera operator: Both upper extremities Wheelchair Parts Management: Needs assistance Distance: 157f  Trunk/Postural Assessment  Cervical Assessment Cervical Assessment: Within Functional Limits Thoracic Assessment Thoracic Assessment: Within Functional Limits Lumbar Assessment Lumbar  Assessment: Exceptions to WFL(posterior pelvic tilt) Postural Control Postural Control: Deficits on evaluation  Balance Balance Balance Assessed: Yes Static Sitting Balance Static Sitting - Balance Support: Bilateral upper extremity supported Static Sitting - Level of Assistance: 6: Modified independent (Device/Increase time)(bedrails) Dynamic Sitting Balance Dynamic Sitting - Balance Support: Bilateral upper extremity supported Dynamic Sitting - Level of Assistance: 5: Stand by assistance(supervision) Static Standing Balance Static Standing - Balance Support: Bilateral upper extremity supported(parallel bars) Static Standing - Level of  Assistance: 3: Mod assist Extremity Assessment  RLE Assessment RLE Assessment: Exceptions to [facility] General Strength Comments: grossly generalized to 4/5 (hip flexion, add, abd) LLE Assessment LLE Assessment: Exceptions to [facility] General Strength Comments: grossly generalized to 4/5 (except ankle DF 3+/5)   Alfonse Alpers PT, DPT  07/27/2019, 7:48 AM

## 2019-07-27 NOTE — Progress Notes (Signed)
Brittany Russo PHYSICAL MEDICINE & REHABILITATION PROGRESS NOTE  Subjective/Complaints: Patient seen laying in bed this morning.  She states she slept well overnight, she states she still sleepy.  She states she is looking forward to discharge tomorrow.  ROS: Denies CP, SOB, N/V/D  Objective: Vital Signs: Blood pressure (!) 124/55, pulse 89, temperature 98.4 F (36.9 C), resp. rate 16, height 5' 5"  (1.651 m), weight 70 kg, SpO2 97 %. No results found. No results for input(s): WBC, HGB, HCT, PLT in the last 72 hours. No results for input(s): NA, K, CL, CO2, GLUCOSE, BUN, CREATININE, CALCIUM in the last 72 hours.  Physical Exam: BP (!) 124/55 (BP Location: Right Arm)   Pulse 89   Temp 98.4 F (36.9 C)   Resp 16   Ht 5' 5"  (1.651 m)   Wt 70 kg   SpO2 97%   BMI 25.68 kg/m   Constitutional: No distress . Vital signs reviewed. HENT: Normocephalic.  Atraumatic. Eyes: EOMI. No discharge. Cardiovascular: No JVD. Respiratory: Normal effort.  No stridor. GI: Non-distended. Skin: Right AKA with serosanguineous drainage. Psych: Normal mood.  Normal behavior. Musc: Bilateral lower extremity edema, stable Neuro: Alert Motor:  Left lower extremity: Hip flexion, knee extension 4/5, ankle dorsiflexion tight heelcord, but able to dorsiflex, 4-/5 ADF, stable Right lower extremity: Hip flexion 4/5 (some pain inhibition), slowly improving  Assessment/Plan: 1. Functional deficits secondary to right AKA which require 3+ hours per day of interdisciplinary therapy in a comprehensive inpatient rehab setting.  Physiatrist is providing close team supervision and 24 hour management of active medical problems listed below.  Physiatrist and rehab team continue to assess barriers to discharge/monitor patient progress toward functional and medical goals  Care Tool:  Bathing    Body parts bathed by patient: Right arm, Left arm, Chest, Abdomen, Front perineal area, Right upper leg, Left upper leg, Face,  Buttocks   Body parts bathed by helper: Buttocks, Left upper leg Body parts n/a: Right lower leg   Bathing assist Assist Level: Minimal Assistance - Patient > 75%     Upper Body Dressing/Undressing Upper body dressing   What is the patient wearing?: Pull over shirt    Upper body assist Assist Level: Set up assist    Lower Body Dressing/Undressing Lower body dressing      What is the patient wearing?: Pants, Incontinence brief     Lower body assist Assist for lower body dressing: Moderate Assistance - Patient 50 - 74%     Toileting Toileting    Toileting assist Assist for toileting: Maximal Assistance - Patient 25 - 49% Assistive Device Comment: DABSC   Transfers Chair/bed transfer  Transfers assist  Chair/bed transfer activity did not occur: Safety/medical concerns  Chair/bed transfer assist level: Moderate Assistance - Patient 50 - 74%     Locomotion Ambulation   Ambulation assist   Ambulation activity did not occur: Safety/medical concerns(fatigue, LE weakness, decreased balance/postural control, fear of falling)          Walk 10 feet activity   Assist  Walk 10 feet activity did not occur: Safety/medical concerns(fatigue, LE weakness, decreased balance/postural control, fear of falling)        Walk 50 feet activity   Assist Walk 50 feet with 2 turns activity did not occur: Safety/medical concerns(fatigue, LE weakness, decreased balance/postural control, fear of falling)         Walk 150 feet activity   Assist Walk 150 feet activity did not occur: Safety/medical concerns(fatigue, LE weakness, decreased  balance/postural control, fear of falling)         Walk 10 feet on uneven surface  activity   Assist Walk 10 feet on uneven surfaces activity did not occur: Safety/medical concerns(fatigue, LE weakness, decreased balance/postural control, fear of falling)         Wheelchair     Assist Will patient use wheelchair at  discharge?: Yes Type of Wheelchair: Manual    Wheelchair assist level: Supervision/Verbal cueing Max wheelchair distance: 156f    Wheelchair 50 feet with 2 turns activity    Assist        Assist Level: Supervision/Verbal cueing   Wheelchair 150 feet activity     Assist     Assist Level: Maximal Assistance - Patient 25 - 49%      Medical Problem List and Plan: 1.  Impaired function, ADLs and mobility secondary to R AKA due to failed TKRs  Continue CIR  Appreciate hospitalist recs, discussed with MD, signed off 2.  Antithrombotics: -DVT/anticoagulation:  Cannot do lovenox due to low platelets             -antiplatelet therapy: N/A 3. Pain Management: Tramadol 50 mg qid with robaxin/Hydrocodone prn  Phantom limb pain    Gabapentin 100 BID started on 4/13, changed to 300 mg nightly on 4/15, changed to twice daily on 4/19, tolerating  Controlled with meds on 5/6 4. Mood: LCSW to follow for evaluation and support.              -antipsychotic agents: N/A 5. Neuropsych: This patient is ?fully capable of making decisions on her own behalf.  Discussed admission with neuropsych, appreciate evaluation and recs 6. Skin/Wound Care: Monitor wound for healing once surgical dressing removed.   ABD pad, with Kerlix and Ace dressings to AKA, continue for time being. 7. Fluids/Electrolytes/Nutrition: Monitor I/Os 8. Cirrhosis of the liver:   Appreciate GI recs, discussed with hospitalist further recs  IV Lasix 20 mg daily, increased to 40 mg, transition to 20 p.o. on 5/2  Status post paracentesis on 4/30 removing 1.4 L of fluid Filed Weights   07/23/19 0410 07/24/19 0500 07/25/19 0500  Weight: 66.5 kg 66.9 kg 70 kg   ?  Reliability on 5/4, no weights x2 days  Plan for outpatient follow up with specialist 9. ABLA:   Hemoglobin 8.6 on 5/3, labs pending  S/p transfusion on 4/27  Flex/Sig in 2-3 weeks  Continue to monitor 10. Chronic Thrombocytopenia: Monitor for signs of  bleeding. Baseline at 74-->51.   Platelets 100 on 5/3, labs pending  See #8  Continue to monitor 11. Chronic hyponatremia: Continue to monitor.   Sodium 131 on 5/3  Continue to monitor 12. T2DM with hyperglycemia: Hgb A1c-5.2.   Glucotrol 2.5 twice daily, discussed with pharmacy, DCed on 4/23  Continue Tradjenta 5 mg daily  Continue metformin   Lantus 20 units daily, decreased to 15 units on 4/18, discussed with pharmacy, DWylieon 4/23  Labile on 5/6 CBG (last 3)  Recent Labs    07/26/19 1623 07/26/19 2139 07/27/19 0610  GLUCAP 199* 135* 202*  13. H/o depression: ON Lexapro, Xanax and Wellbutrin. 14.  Labile blood pressure  Controlled on 5/6 15.  Radiation cystitis-   Appreciate Urology recs - CBI d/ced.    May need to restart CBI  Repeat UA with large Hb on 4/29, again on 4/30  D/ced foley, voiding  PVRs showing higher volumes, likely related to ascites.  16. Acute lower UTI  UA +,  Ucx >100K Citrobacter  Completed Macrobid on 4/28  Repeat UA suggests infection, urine culture ordered- remains pending  Fosfomycin on 5/1 17. Supratherapeutic INR  INR 1.4 on 5/6  After Vit K and FFP on 4/28 18. Pleural effusion  Seen on CXR, personally reviewed, repeat personally reviewed, improved  S/p thoracentesis on 4/29, removing 1.2 L of fluid  Cultures reactive mesothelial cells  LOS: 23 days A FACE TO FACE EVALUATION WAS PERFORMED  Jacquette Canales Lorie Phenix 07/27/2019, 8:38 AM

## 2019-07-27 NOTE — Progress Notes (Signed)
Team Conference Report to [employer] discussion was reviewed with the patient and daughter:Brittany Russo, including goals for min-mod assist, any changes in plan of care- extension to end of the week due to medical issues and target discharge date.  Patient and caregiver express understanding and are in agreement.  The patient has a target discharge date of 07/28/19. Family education review set up for 07/27/19 @ 2p.m. with [name]. [place] secured and equipment is in the room. [employer] was notified of concern for wheelchair arm clip is broken and daughter would like the arm to be replaced or repaired. [place] staff to assess wheelchair arm and will advise action to address broken clip before discharge. Transportation to the home arranged with [facility] for 07/28/19 @ 1000.  Dorien Chihuahua B 07/27/2019, 4:16 PM

## 2019-07-28 DIAGNOSIS — K627 Radiation proctitis: Secondary | ICD-10-CM

## 2019-07-28 LAB — PROTIME-INR
INR: 1.4 — ABNORMAL HIGH (ref 0.8–1.2)
Prothrombin Time: 16.6 seconds — ABNORMAL HIGH (ref 11.4–15.2)

## 2019-07-28 LAB — GLUCOSE, CAPILLARY: Glucose-Capillary: 184 mg/dL — ABNORMAL HIGH (ref 70–99)

## 2019-07-28 NOTE — Progress Notes (Addendum)
Patient discharged via [facility] accompanied by staff and family with patient belongings. IV removed prior to discharge. Patient educated and discharge instructions given and explained by provider.

## 2019-07-28 NOTE — Discharge Summary (Addendum)
Physician Discharge Summary  Patient ID: Brittany Russo MRN: [id number] DOB/AGE: 1946/03/17 73 y.o.  Admit date: 07/04/2019 Discharge date: 07/28/2019  Discharge Diagnoses:  Principal Problem:   Amputation above knee [facility]) Active Problems:   Cirrhosis ([place])   Controlled type 2 diabetes mellitus with hyperglycemia, with long-term current use of insulin (HCC)   Hyponatremia   Thrombocytopenia (HCC)   Phantom limb pain (HCC)   Anxiety state   Radiation cystitis   Acute on chronic anemia   Status post thoracentesis   Radiation proctitis   Discharged Condition: stable   Significant Diagnostic Studies: DG Chest 1 View  Result Date: 07/19/2019 CLINICAL DATA:  Status post right thoracentesis EXAM: CHEST  1 VIEW COMPARISON:  07/18/2019 FINDINGS: Cardiac shadow remains enlarged. Aortic calcifications are again seen. Interval thoracentesis has been performed on the right with resolution of the right-sided pleural effusion. No sizable pneumothorax is seen. No acute bony abnormality is noted. IMPRESSION: No pneumothorax following right-sided thoracentesis. Electronically Signed   By: Inez Catalina M.D.   On: 07/19/2019 15:37    DG Chest 2 View  Result Date: 07/20/2019 CLINICAL DATA:  Pleural effusion. EXAM: CHEST - 2 VIEW COMPARISON:  July 19, 2019. FINDINGS: Stable cardiomediastinal silhouette. No pneumothorax or pleural effusion is noted. Atherosclerosis of thoracic aorta is noted. No acute pulmonary disease is noted. Bony thorax is unremarkable. IMPRESSION: No active cardiopulmonary disease. Aortic Atherosclerosis (ICD10-I70.0). Electronically Signed   By: Marijo Conception M.D.   On: 07/20/2019 12:37   DG Chest 2 View  Result Date: 07/18/2019 CLINICAL DATA:  Lethargy. Dizziness and nausea. EXAM: CHEST - 2 VIEW COMPARISON:  Chest CT 03/28/2017 FINDINGS: Moderate right pleural effusion with fluid tracking laterally and in the fissure. Mild cardiomegaly. Aortic atherosclerosis and tortuosity.  Vascular congestion without pulmonary edema. No left pleural effusion. No pneumothorax. No acute osseous abnormalities are seen. IMPRESSION: 1. Moderate right pleural effusion with fluid tracking laterally and in the fissure. 2. Mild cardiomegaly with vascular congestion. Electronically Signed   By: Keith Rake M.D.   On: 07/18/2019 18:44   US RENAL  Result Date: 07/18/2019 CLINICAL DATA:  Hematuria EXAM: RENAL / URINARY TRACT ULTRASOUND COMPLETE COMPARISON:  Chest x-ray from earlier in the same day. FINDINGS: Right Kidney: Renal measurements: 11.3 x 4.6 x 5.3 cm. = volume: 145 mL . Echogenicity within normal limits. No mass or hydronephrosis visualized. Left Kidney: Renal measurements: 10.1 x 5.9 x 5.2 cm. = volume: 161 mL. Echogenicity within normal limits. No mass or hydronephrosis visualized. Bladder: Bladder is decompressed. Echogenic material is noted within which may be related to thrombus or focal mass. Given the history of hematuria this likely represents thrombus. Other: Mild ascites is noted. Large right-sided pleural effusion is seen. Cirrhotic changes of liver are noted. IMPRESSION: Normal appearing kidneys bilaterally. Echogenic material within the bladder which is partially distended. Given the patient's history of hematuria this likely represents thrombus. Direct visualization may be helpful. Mild ascites and large right pleural effusion. Cirrhotic change of the liver is noted. Electronically Signed   By: Inez Catalina M.D.   On: 07/18/2019 21:24   IR Paracentesis  Result Date: 07/21/2019 INDICATION: Patient with history of cervical cancer, status post recent thoracentesis for pleural effusion, also with ascites. Request is made for therapeutic paracentesis. EXAM: ULTRASOUND GUIDED THERAPEUTIC PARACENTESIS MEDICATIONS: 10 mL 1% lidocaine COMPLICATIONS: None immediate. PROCEDURE: Informed written consent was obtained from the patient after a discussion of the risks, benefits and  alternatives to treatment. A timeout  was performed prior to the initiation of the procedure. Initial ultrasound scanning demonstrates a small amount of ascites within the right lower abdominal quadrant. The right lower abdomen was prepped and draped in the usual sterile fashion. 1% lidocaine was used for local anesthesia. Following this, a 19 gauge, 7-cm, Yueh catheter was introduced. An ultrasound image was saved for documentation purposes. The paracentesis was performed. The catheter was removed and a dressing was applied. The patient tolerated the procedure well without immediate post procedural complication. FINDINGS: A total of approximately 1.4 liters of yellow fluid was removed. Samples were sent to the laboratory as requested by the clinical team. IMPRESSION: Successful ultrasound-guided therapeutic paracentesis yielding 1.4 liters of peritoneal fluid. Read by: Brynda Greathouse PA-C Electronically Signed   By: Aletta Edouard M.D.   On: 07/21/2019 15:07   IR THORACENTESIS ASP PLEURAL SPACE W/IMG GUIDE  Result Date: 07/19/2019 INDICATION: Cirrhosis secondary to non alcoholic fatty liver disease. Right pleural effusion. Request for diagnostic and therapeutic thoracentesis. EXAM: ULTRASOUND GUIDED RIGHT THORACENTESIS MEDICATIONS: 1% lidocaine 10 COMPLICATIONS: None immediate. PROCEDURE: An ultrasound guided thoracentesis was thoroughly discussed with the patient and questions answered. The benefits, risks, alternatives and complications were also discussed. The patient understands and wishes to proceed with the procedure. Written consent was obtained. Ultrasound was performed to localize and mark an adequate pocket of fluid in the right chest. The area was then prepped and draped in the normal sterile fashion. 1% Lidocaine was used for local anesthesia. Under ultrasound guidance a 6 Fr Safe-T-Centesis catheter was introduced. Thoracentesis was performed. The catheter was removed and a dressing applied.  FINDINGS: A total of approximately 1.2 L of clear orange fluid was removed. Samples were sent to the laboratory as requested by the clinical team. IMPRESSION: Successful ultrasound guided right thoracentesis yielding 1.2 L of pleural fluid. Read by: Gareth Eagle, PA-C Electronically Signed   By: Corrie Mckusick D.O.   On: 07/19/2019 15:06    Labs:  Basic Metabolic Panel: BMP Latest Ref Rng & Units 07/24/2019 07/23/2019  Glucose 70 - 99 mg/dL 185(H) 178(H)  BUN 8 - 23 mg/dL 12 14  Creatinine 0.44 - 1.00 mg/dL 1.03(H) 0.75  Sodium 135 - 145 mmol/L 131(L) 128(L)  Potassium 3.5 - 5.1 mmol/L 3.8 3.7  Chloride 98 - 111 mmol/L 95(L) 95(L)  CO2 22 - 32 mmol/L 26 26  Calcium 8.9 - 10.3 mg/dL 8.2(L) 8.0(L)    CBC: CBC Latest Ref Rng & Units 07/27/2019 07/24/2019  WBC 4.0 - 10.5 K/uL 5.8 6.0  Hemoglobin 12.0 - 15.0 g/dL 9.0(L) 8.6(L)  Hematocrit 36.0 - 46.0 % 29.1(L) 27.4(L)  Platelets 150 - 400 K/uL 106(L) 100(L)    CBG: Recent Labs  Lab 07/27/19 0610 07/27/19 1141 07/27/19 1627 07/27/19 2150 07/28/19 0603  GLUCAP 202* 290* 181* 249* 184*    Brief HPI:   Brittany Russo is a 74 y.o. 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 She has been limited by pain with functional disability affecting QOL and elected to undergo R-AKA on 07/08/19 by Dr. Alvan Dame. Post op NWB and therapy ongoing. She has had limitations due to fatigue, pain and hypoglycemia. ABLA with Hgb 8.0 and thrombocytopenia with platelets down to 51 was being monitored as no signs of bleeding reported. CIR was consulted due to functional decline.     Hospital Course: Brittany Russo was admitted to rehab 07/04/2019 for inpatient therapies to consist  of PT, ST and OT at least three hours five days a week. Past admission physiatrist, therapy team and rehab RN have worked together to provide customized collaborative inpatient rehab.   She has  had intermittent hematuria and GU reported that this was consistent with here history of radiation cystitis and to monitor.  She was found to have Citrobacter Ferundi UTI and treated with 7 day course of Macrobid. Lactulose was added on 4/14 due to elevated ammonia levels- 54 and has been adjusted to 30 mg daily as ammonia levels normalized and to prevent frequent incontinent episodes.   Thrombocytopenia has been monitored with serial checks and was improving but she had  worsening of hematuria worsened with multiple clots due to coagulopathy with rise in INR to 5.4 as well as drop in H/H to 6.8/22.3  on 04/27. Hospitalist were consulted to input and has been assisting with management of hepatic decompensation. Patient was received one unit FFP and vitamin K for INR reversal as well as one unit of PRBC for anemia.  Serial check of INR showed improvement with INR stable at 1.4. Dr. Oletta Lamas was consulted for input and recommended addition of aldactone and lasix for diuresis as well as mesalamine suppositories indefinitely due to radiation proctitis.     She developed urinary retention due to multiple clots and renal ultrasound showed echogenic material in bladder likely thrombus and large right pleural effusion.  Three way catheter placed with hand irrigation till clear by urology and patient underwent CBI X 24 hours with resolution of hematuria. Foley was removed after 2 days as urine remained clear and she has been voiding without difficulty. Repeat urine culture showed multispecies which was treated with Fosfomycin X 1.  She underwent thoracocentesis of 1.2 Liters of clear orange fluid on 04/28 and culture showed reactive mesothelial cells.  Abdominal ultrasound revealed ascites and she underwent paracentesis of l.4 liters of peritoneal fluid on 04/30.  Pain has been managed with prn use of tramadol or hydrocodone. Gabapentin was resumed, scheduled bid to help manage phantom pain and she was educated on  desensitization techniques. Wound VAC was removed after a week and she has had intermittent serosanguinous drainage from incision. Staples removed on 05/03 and dry compressive wraps to continue 1-2 X day to keep incision clean and dry. She has been instructed on desensitization techniques also. Blood pressures were monitored on TID basis and has been stable on current dose lasix and spironolactone. Hyponatremia felt to be due to cirrhosis and has been monitored. Sodium is stable overall and recommend follow up labs in 1-2 weeks.   Her diabetes has been monitored with ac/hs CBG checks and SSI was use prn for tighter BS control. She had hypoglycemic episodes on home regimen therefore glipizide was discontinued and Lantus was discontinued. Blood sugars were trending upwards at discharge and family instructed to resume Lantus per SSI home regimen.  She has made gains during rehab stay and is but continues to be limited by fear of falling, fatigue and anxiety. She currently requires min assist at wheelchair level. She will continue to receive follow up HHPT and [facility] after discharge   Rehab course: During patient's stay in rehab weekly team conferences were held to monitor patient's progress, set goals and discuss barriers to discharge. At admission, patient required mod to max assist for ADLs and mod assist with mobility.  She  has had improvement in activity tolerance, balance, postural control as well as ability to compensate for deficits.  She is able to complete ADL tasks with min to max assist overall. She requires min assist for transfers, mod assist for sit to stand in parallel bars and gait not tested. She is able to propel her wheelchair at min assist level.  Family education was completed regarding all aspects of care and safety.   Disposition: Home  Diet: Carb Modified/ Low salt.   Special Instructions: 1. Drink plenty of fluids 2. Will need CBC/BMET repeated in one week.     Discharge Instructions     Ambulatory referral to Physical Medicine Rehab   Complete by: As directed       Allergies as of 07/28/2019       Reactions   Asa [aspirin] Other (See Comments)   Pt not allergic but cannot take due to bleeding    Other Other (See Comments)   Any jewelry metal-hives and itching         Medication List     STOP taking these medications    docusate sodium 100 MG capsule Commonly known as: Colace   glipiZIDE 5 MG tablet Commonly known as: GLUCOTROL   Glucagon Emergency 1 MG Kit   rivaroxaban 10 MG Tabs tablet Commonly known as: Xarelto       TAKE these medications    acetaminophen 325 MG tablet Commonly known as: TYLENOL Take 1-2 tablets (325-650 mg total) by mouth every 4 (four) hours as needed for mild pain. What changed:  medication strength how much to take when to take this reasons to take this   albuterol 108 (90 Base) MCG/ACT inhaler Commonly known as: VENTOLIN HFA Inhale 1-2 puffs into the lungs every 6 (six) hours as needed for wheezing or shortness of breath.   ALPRAZolam 0.25 MG tablet Commonly known as: XANAX Take 1 tablet (0.25 mg total) by mouth 2 (two) times daily.   atenolol 50 MG tablet Commonly known as: TENORMIN Take 50 mg by mouth every morning.   atorvastatin 80 MG tablet Commonly known as: LIPITOR Take 80 mg by mouth daily at 8 pm.   Basaglar KwikPen 100 UNIT/ML Inject 20-30 Units into the skin daily. Administered day vs night depending on blood sugar levels   buPROPion 150 MG 24 hr tablet Commonly known as: WELLBUTRIN XL Take 150 mg by mouth in the morning.   escitalopram 20 MG tablet Commonly known as: LEXAPRO Take 20 mg by mouth in the morning.   ferrous sulfate 325 (65 FE) MG tablet Commonly known as: FerrouSul Take 1 tablet (325 mg total) by mouth 3 (three) times daily with meals.   fluticasone 50 MCG/ACT nasal spray Commonly known as: FLONASE Place 1 spray into both nostrils in the  morning.   furosemide 20 MG tablet Commonly known as: LASIX Take 1 tablet (20 mg total) by mouth 2 (two) times daily.   gabapentin 300 MG capsule Commonly known as: NEURONTIN Take 1 capsule (300 mg total) by mouth 2 (two) times daily. What changed:  when to take this reasons to take this   HYDROcodone-acetaminophen 7.5-325 MG tablet--Rx# 12 pills Commonly known as: NORCO Take 1 tablet by mouth every 12 (twelve) hours as needed for severe pain.   lactulose 10 GM/15ML solution Commonly known as: CHRONULAC Take 22.5 mLs (15 g total) by mouth 2 (two) times daily. What changed: how much to take   lip balm ointment Apply topically as needed for lip care.   loratadine 10 MG tablet Commonly known as: CLARITIN Take 10 mg by mouth in  the morning.   mesalamine 1000 MG suppository Commonly known as: CANASA Place 1 suppository (1,000 mg total) rectally at bedtime.   methocarbamol 500 MG tablet Commonly known as: Robaxin Take 1 tablet (500 mg total) by mouth every 6 (six) hours as needed for muscle spasms.   multivitamin with minerals Tabs tablet Take 1 tablet by mouth daily.   polyethylene glycol 17 g packet Commonly known as: MIRALAX / GLYCOLAX Take 17 g by mouth daily as needed. What changed:  when to take this reasons to take this   potassium chloride 10 MEQ tablet Commonly known as: KLOR-CON Take 1 tablet (10 mEq total) by mouth daily.   rifaximin 550 MG Tabs tablet Commonly known as: XIFAXAN Take 550 mg by mouth 2 (two) times daily.   sitaGLIPtin-metformin 50-500 MG tablet Commonly known as: JANUMET Take 1 tablet by mouth 2 (two) times daily with a meal.   spironolactone 100 MG tablet Commonly known as: ALDACTONE Take 1 tablet (100 mg total) by mouth daily.   traMADol 50 MG tablet--Rx# 28 pills Commonly known as: ULTRAM Take 1 tablet (50 mg total) by mouth every 6 (six) hours as needed for moderate pain. What changed:  how much to take reasons to take  this   traZODone 50 MG tablet Commonly known as: DESYREL Take 50 mg by mouth at bedtime as needed for sleep.       Follow-up Information     Jamse Arn, MD Follow up.   Specialty: Physical Medicine and Rehabilitation Why: Office will call you with follow up appointment Contact information: [address] [place] [address] [phone]         Ian Bushman, MD Follow up on 08/09/2019.   Specialty: Transplant Hepatology Why: Keep follow up appointment for input on liver disease Contact information: [address] [place] [address] [phone]         Rosalee Kaufman, [place]. Call on 08/03/2019.   Specialty: Physician Assistant Why: Appointment at 2 pm for post hospital follow up/labs. Contact information: [place] [address] [phone]         Paralee Cancel, MD. Call.   Specialty: Orthopedic Surgery Why: for post op check Contact information: [address] [place] [address] [place]  [address] [phone]         Clarene Essex, MD Follow up.   Specialty: Gastroenterology Why: You will need to sign records release and have Dr. Olevia Perches office fax information to them prior to setting appointment--firs available is in June.  Contact information: [address] [place] [address] [phone]         [facility]. Call on 07/31/2019.   Why: for follow up appointment in 2 weeks. Contact information: [place] [phone]           Signed: Bary Leriche 07/31/2019, 11:56 AM Patient was seen, face-face, and physical exam performed by me on day of discharge, greater than 30 minutes of total time spent.. Please see progress note from day of discharge as well.  Delice Lesch, MD, ABPMR

## 2019-07-28 NOTE — Progress Notes (Signed)
Orthopedic Tech Progress Note Patient Details:  Brittany Russo [birth year] [id number] Called in order to [facility] for a [facility]RINKER Patient ID: Brittany Russo, female   DOB: 1945/09/14, 74 y.o.   MRN: [id number]   Brittany Russo 07/28/2019, 10:04 AM

## 2019-07-28 NOTE — Progress Notes (Signed)
PHYSICAL MEDICINE & REHABILITATION PROGRESS NOTE  Subjective/Complaints: Patient seen laying in bed this AM.  She states she slept well overnight.  She is looking forward to discharge.   ROS: Denies CP, SOB, N/V/D  Objective: Vital Signs: Blood pressure (!) 135/58, pulse 76, temperature 97.6 F (36.4 C), resp. rate 16, height 5' 5"  (1.651 m), weight 70 kg, SpO2 94 %. No results found. Recent Labs    07/27/19 1052  WBC 5.8  HGB 9.0*  HCT 29.1*  PLT 106*   No results for input(s): NA, K, CL, CO2, GLUCOSE, BUN, CREATININE, CALCIUM in the last 72 hours.  Physical Exam: BP (!) 135/58 (BP Location: Right Arm)   Pulse 76   Temp 97.6 F (36.4 C)   Resp 16   Ht 5' 5"  (1.651 m)   Wt 70 kg   SpO2 94%   BMI 25.68 kg/m   Constitutional: No distress . Vital signs reviewed. HENT: Normocephalic.  Atraumatic. Eyes: EOMI. No discharge. Cardiovascular: No JVD. Respiratory: Normal effort.  No stridor. GI: Non-distended. Skin: Right AKA with dressing C/D/I. Psych: Normal mood.  Normal behavior. Musc: Bilateral lower extremity edema, unchanged. Neuro: Alert Motor:  Left lower extremity: Hip flexion, knee extension 4/5, ankle dorsiflexion tight heelcord, but able to dorsiflex, 4-/5 ADF, unchanged Right lower extremity: Hip flexion 4/5 (some pain inhibition), slowly improving  Assessment/Plan: 1. Functional deficits secondary to right AKA which require 3+ hours per day of interdisciplinary therapy in a comprehensive inpatient rehab setting.  Physiatrist is providing close team supervision and 24 hour management of active medical problems listed below.  Physiatrist and rehab team continue to assess barriers to discharge/monitor patient progress toward functional and medical goals  Care Tool:  Bathing    Body parts bathed by patient: Right arm, Left arm, Chest, Abdomen, Front perineal area, Right upper leg, Left upper leg, Face, Buttocks   Body parts bathed by helper:  Buttocks, Left upper leg Body parts n/a: Right lower leg   Bathing assist Assist Level: Minimal Assistance - Patient > 75%     Upper Body Dressing/Undressing Upper body dressing   What is the patient wearing?: Pull over shirt    Upper body assist Assist Level: Set up assist    Lower Body Dressing/Undressing Lower body dressing      What is the patient wearing?: Pants, Incontinence brief     Lower body assist Assist for lower body dressing: Moderate Assistance - Patient 50 - 74%     Toileting Toileting    Toileting assist Assist for toileting: Maximal Assistance - Patient 25 - 49% Assistive Device Comment: DABSC   Transfers Chair/bed transfer  Transfers assist  Chair/bed transfer activity did not occur: Safety/medical concerns  Chair/bed transfer assist level: Minimal Assistance - Patient > 75%(slideboard)     Locomotion Ambulation   Ambulation assist   Ambulation activity did not occur: Safety/medical concerns(R AKA, fatigue, LE weakness, decreased balance/postural control, fear of falling)          Walk 10 feet activity   Assist  Walk 10 feet activity did not occur: Safety/medical concerns(R AKA, fatigue, LE weakness, decreased balance/postural control, fear of falling)        Walk 50 feet activity   Assist Walk 50 feet with 2 turns activity did not occur: Safety/medical concerns(R AKA, fatigue, LE weakness, decreased balance/postural control, fear of falling)         Walk 150 feet activity   Assist Walk 150 feet activity did not  occur: Safety/medical concerns(R AKA, fatigue, LE weakness, decreased balance/postural control, fear of falling)         Walk 10 feet on uneven surface  activity   Assist Walk 10 feet on uneven surfaces activity did not occur: Safety/medical concerns(R AKA, fatigue, LE weakness, decreased balance/postural control, fear of falling)         Wheelchair     Assist Will patient use wheelchair at  discharge?: Yes Type of Wheelchair: Manual    Wheelchair assist level: Independent Max wheelchair distance: 145f    Wheelchair 50 feet with 2 turns activity    Assist        Assist Level: Independent   Wheelchair 150 feet activity     Assist     Assist Level: Independent      Medical Problem List and Plan: 1.  Impaired function, ADLs and mobility secondary to R AKA due to failed TKRs  DC today  Will see patient for transitional care management in 1-2 weeks post-discharge  Appreciate hospitalist recs, discussed with MD, signed off 2.  Antithrombotics: -DVT/anticoagulation:  Cannot do lovenox due to low platelets             -antiplatelet therapy: N/A 3. Pain Management: Tramadol 50 mg qid with robaxin/Hydrocodone prn  Phantom limb pain    Gabapentin 100 BID started on 4/13, changed to 300 mg nightly on 4/15, changed to twice daily on 4/19  Controlled with meds on 5/7 4. Mood: LCSW to follow for evaluation and support.              -antipsychotic agents: N/A 5. Neuropsych: This patient is ?fully capable of making decisions on her own behalf.  Discussed admission with neuropsych, appreciate evaluation and recs 6. Skin/Wound Care: Monitor wound for healing once surgical dressing removed.   ABD pad, with Kerlix and Ace dressings to AKA, continue for time being. 7. Fluids/Electrolytes/Nutrition: Monitor I/Os 8. Cirrhosis of the liver:   Appreciate GI recs, discussed with hospitalist further recs  IV Lasix 20 mg daily, increased to 40 mg, transition to 20 p.o. on 5/2, increase to twice daily  Continue spironolactone  Status post paracentesis on 4/30 removing 1.4 L of fluid Filed Weights   07/23/19 0410 07/24/19 0500 07/25/19 0500  Weight: 66.5 kg 66.9 kg 70 kg   ?  Reliability on 5/4, no weights x3 days  Plan for outpatient follow up with specialist 9. ABLA:   Hemoglobin 9.0 on 5/6  S/p transfusion on 4/27  Flex/Sig in 2-3 weeks  Continue to monitor 10.  Chronic Thrombocytopenia: Monitor for signs of bleeding. Baseline at 74-->51.   Platelets 106 on 5/6  See #8  Continue to monitor 11. Chronic hyponatremia: Continue to monitor.   Sodium 131 on 5/3  Continue to monitor 12. T2DM with hyperglycemia: Hgb A1c-5.2.   Glucotrol 2.5 twice daily, discussed with pharmacy, DCed on 4/23  Continue Tradjenta 5 mg daily  Continue metformin   Lantus 20 units daily, decreased to 15 units on 4/18, discussed with pharmacy, DMcKenzieon 4/23  Increased lability over the last every 4 hours, monitor as outpatient for further ambulatory adjustments CBG (last 3)  Recent Labs    07/27/19 1627 07/27/19 2150 07/28/19 0603  GLUCAP 181* 249* 184*  13. H/o depression: ON Lexapro, Xanax and Wellbutrin. 14.  Labile blood pressure  Controlled on 5/6 15.  Radiation cystitis-   Appreciate Urology recs - CBI d/ced.    May need to restart CBI  Repeat UA  with large Hb on 4/29, again on 4/30  D/ced foley, voiding  PVRs showing higher volumes, likely related to ascites.  16. Acute lower UTI  UA +, Ucx >100K Citrobacter  Completed Macrobid on 4/28  Repeat UA suggests infection, urine culture ordered- remains pending  Fosfomycin on 5/1 17. Supratherapeutic INR  INR 1.4 on 5/7  After Vit K and FFP on 4/28 18. Pleural effusion  Seen on CXR, personally reviewed, repeat personally reviewed, improved  S/p thoracentesis on 4/29, removing 1.2 L of fluid  Cultures reactive mesothelial cells  > 30 minutes spent in total in discharge planning between myself and PA regarding aforementioned, as well discussion regarding DME equipment, follow-up appointments, follow-up therapies, discharge medications, discharge recommendations, phantom limb pain, edema, etc.  LOS: 24 days A FACE TO FACE EVALUATION WAS PERFORMED  Brittany Russo Brittany Russo 07/28/2019, 8:50 AM

## 2019-07-28 NOTE — Plan of Care (Signed)
  Problem: Consults Goal: RH LIMB LOSS PATIENT EDUCATION Description: Description: See Patient Education module for eduction specifics. Outcome: Completed/Met Goal: Skin Care Protocol Initiated - if Braden Score 18 or less Description: If consults are not indicated, leave blank or document N/A Outcome: Completed/Met Goal: Diabetes Guidelines if Diabetic/Glucose > 140 Description: If diabetic or lab glucose is > 140 mg/dl - Initiate Diabetes/Hyperglycemia Guidelines & Document Interventions  Outcome: Completed/Met   Problem: RH SKIN INTEGRITY Goal: RH STG SKIN FREE OF INFECTION/BREAKDOWN Description: Patients skin will remain free from further infection or breakdown with Min assist. Outcome: Completed/Met Goal: RH STG MAINTAIN SKIN INTEGRITY WITH ASSISTANCE Description: STG Maintain Skin Integrity With min Assistance. Outcome: Completed/Met Goal: RH STG ABLE TO PERFORM INCISION/WOUND CARE W/ASSISTANCE Description: STG Able To Perform Incision/Wound Care With total Assistance from caregiver. Outcome: Completed/Met   Problem: RH SAFETY Goal: RH STG ADHERE TO SAFETY PRECAUTIONS W/ASSISTANCE/DEVICE Description: STG Adhere to Safety Precautions With mod I  Assistance/Device. Outcome: Completed/Met   Problem: RH PAIN MANAGEMENT Goal: RH STG PAIN MANAGED AT OR BELOW PT'S PAIN GOAL Description: < 4 Outcome: Completed/Met   Problem: RH KNOWLEDGE DEFICIT LIMB LOSS Goal: RH STG INCREASE KNOWLEDGE OF SELF CARE AFTER LIMB LOSS Description: Patient/caregiver will verbalize understanding of treatment, monitoring, medications, and follow up care of her new amputation with min assist. Outcome: Completed/Met

## 2019-07-28 NOTE — Discharge Instructions (Signed)
Inpatient Rehab Discharge Instructions  Brittany Russo Discharge date and time:  07/27/19  Activities/Precautions/ Functional Status: Activity: activity as tolerated Diet: Low salt diet. Wear support stocking. Keep left get elevated when seated.  Wound Care: Wash with soap and water-keep wound clean. Cover with dry dressing and change 1-2 X day.  Contact Dr. Alvan Dame if you develop any problems with your incision/wound--redness, swelling, increase in pain, drainage or if you develop fever or chills.   Functional status:  ___ No restrictions     ___ Walk up steps independently _X__ 24/7 supervision/assistance   ___ Walk up steps with assistance ___ Intermittent supervision/assistance  ___ Bathe/dress independently ___ Walk with walker     _X__ Bathe/dress with assistance ___ Walk Independently    ___ Shower independently ___ Walk with assistance    ___ Shower with assistance _X__ No alcohol     ___ Return to work/school ________   COMMUNITY REFERRALS UPON DISCHARGE:  Home Health: PT, OT,    Agency:[facility] Phone:[phone]  Medical Equipment/Items Ordered:HD DA [facility]   Agency/Supplier:[facility]   Special Instructions: 1. Toilet patient on bedside commode every 4 hours and before going to bed. Use bed pan only as needed at nights.  2. Drink plenty of water to flush your kidneys.  3. Weight daily if able and watch for signs of fluid increasing. If swelling increases, contact PCP/GI for adjustment of diuretics. [name]  4.   My questions have been answered and I understand these instructions. I will adhere to these goals and the provided educational materials after my discharge from the hospital.  Patient/Caregiver Signature _______________________________ Date __________  Clinician Signature _______________________________________ Date __________  Please bring this form and your medication list with you to all your follow-up doctor's appointments.

## 2019-07-29 DIAGNOSIS — Z4781 Encounter for orthopedic aftercare following surgical amputation: Secondary | ICD-10-CM | POA: Diagnosis not present

## 2019-07-30 ENCOUNTER — Emergency Department (HOSPITAL_COMMUNITY): Payer: Medicare Other

## 2019-07-30 ENCOUNTER — Other Ambulatory Visit: Payer: Self-pay

## 2019-07-30 ENCOUNTER — Encounter (HOSPITAL_COMMUNITY): Payer: Self-pay | Admitting: Student

## 2019-07-30 ENCOUNTER — Emergency Department (HOSPITAL_COMMUNITY)
Admission: EM | Admit: 2019-07-30 | Discharge: 2019-07-30 | Disposition: A | Discharge status: Home / Self Care | Payer: Medicare Other | Attending: Emergency Medicine | Admitting: Emergency Medicine

## 2019-07-30 DIAGNOSIS — R140 Abdominal distension (gaseous): Secondary | ICD-10-CM | POA: Diagnosis present

## 2019-07-30 DIAGNOSIS — Z89611 Acquired absence of right leg above knee: Secondary | ICD-10-CM | POA: Insufficient documentation

## 2019-07-30 DIAGNOSIS — Z743 Need for continuous supervision: Secondary | ICD-10-CM | POA: Diagnosis not present

## 2019-07-30 DIAGNOSIS — E119 Type 2 diabetes mellitus without complications: Secondary | ICD-10-CM | POA: Insufficient documentation

## 2019-07-30 DIAGNOSIS — Y658 Other specified misadventures during surgical and medical care: Secondary | ICD-10-CM | POA: Diagnosis not present

## 2019-07-30 DIAGNOSIS — Z7901 Long term (current) use of anticoagulants: Secondary | ICD-10-CM | POA: Diagnosis not present

## 2019-07-30 DIAGNOSIS — R609 Edema, unspecified: Secondary | ICD-10-CM | POA: Diagnosis not present

## 2019-07-30 DIAGNOSIS — Z794 Long term (current) use of insulin: Secondary | ICD-10-CM | POA: Diagnosis not present

## 2019-07-30 DIAGNOSIS — R5381 Other malaise: Secondary | ICD-10-CM | POA: Diagnosis not present

## 2019-07-30 DIAGNOSIS — I10 Essential (primary) hypertension: Secondary | ICD-10-CM | POA: Diagnosis not present

## 2019-07-30 DIAGNOSIS — J90 Pleural effusion, not elsewhere classified: Secondary | ICD-10-CM | POA: Diagnosis not present

## 2019-07-30 DIAGNOSIS — R188 Other ascites: Secondary | ICD-10-CM | POA: Diagnosis not present

## 2019-07-30 DIAGNOSIS — T8149XA Infection following a procedure, other surgical site, initial encounter: Secondary | ICD-10-CM | POA: Diagnosis not present

## 2019-07-30 DIAGNOSIS — Z79899 Other long term (current) drug therapy: Secondary | ICD-10-CM | POA: Insufficient documentation

## 2019-07-30 DIAGNOSIS — R279 Unspecified lack of coordination: Secondary | ICD-10-CM | POA: Diagnosis not present

## 2019-07-30 LAB — URINALYSIS, ROUTINE W REFLEX MICROSCOPIC
Bilirubin Urine: NEGATIVE
Glucose, UA: NEGATIVE mg/dL
Ketones, ur: NEGATIVE mg/dL
Leukocytes,Ua: NEGATIVE
Nitrite: NEGATIVE
Protein, ur: NEGATIVE mg/dL
Specific Gravity, Urine: 1.011 (ref 1.005–1.030)
pH: 5 (ref 5.0–8.0)

## 2019-07-30 LAB — LIPASE, BLOOD: Lipase: 48 U/L (ref 11–51)

## 2019-07-30 LAB — COMPREHENSIVE METABOLIC PANEL
ALT: 59 U/L — ABNORMAL HIGH (ref 0–44)
AST: 77 U/L — ABNORMAL HIGH (ref 15–41)
Albumin: 2.2 g/dL — ABNORMAL LOW (ref 3.5–5.0)
Alkaline Phosphatase: 159 U/L — ABNORMAL HIGH (ref 38–126)
Anion gap: 11 (ref 5–15)
BUN: 14 mg/dL (ref 8–23)
CO2: 26 mmol/L (ref 22–32)
Calcium: 8.6 mg/dL — ABNORMAL LOW (ref 8.9–10.3)
Chloride: 97 mmol/L — ABNORMAL LOW (ref 98–111)
Creatinine, Ser: 0.77 mg/dL (ref 0.44–1.00)
GFR calc Af Amer: >60 mL/min (ref >60)
GFR calc non Af Amer: >60 mL/min (ref >60)
Glucose, Bld: 219 mg/dL — ABNORMAL HIGH (ref 70–99)
Potassium: 3.9 mmol/L (ref 3.5–5.1)
Sodium: 134 mmol/L — ABNORMAL LOW (ref 135–145)
Total Bilirubin: 1.4 mg/dL — ABNORMAL HIGH (ref 0.3–1.2)
Total Protein: 5.6 g/dL — ABNORMAL LOW (ref 6.5–8.1)

## 2019-07-30 LAB — CBC WITH DIFFERENTIAL/PLATELET
Abs Immature Granulocytes: 0.02 10*3/uL (ref 0.00–0.07)
Basophils Absolute: 0.1 10*3/uL (ref 0.0–0.1)
Basophils Relative: 1 %
Eosinophils Absolute: 0.1 10*3/uL (ref 0.0–0.5)
Eosinophils Relative: 3 %
HCT: 28.4 % — ABNORMAL LOW (ref 36.0–46.0)
Hemoglobin: 8.5 g/dL — ABNORMAL LOW (ref 12.0–15.0)
Immature Granulocytes: 0 %
Lymphocytes Relative: 14 %
Lymphs Abs: 0.7 10*3/uL (ref 0.7–4.0)
MCH: 29 pg (ref 26.0–34.0)
MCHC: 29.9 g/dL — ABNORMAL LOW (ref 30.0–36.0)
MCV: 96.9 fL (ref 80.0–100.0)
Monocytes Absolute: 0.6 10*3/uL (ref 0.1–1.0)
Monocytes Relative: 12 %
Neutro Abs: 3.6 10*3/uL (ref 1.7–7.7)
Neutrophils Relative %: 70 %
Platelets: 80 10*3/uL — ABNORMAL LOW (ref 150–400)
RBC: 2.93 MIL/uL — ABNORMAL LOW (ref 3.87–5.11)
RDW: 17.7 % — ABNORMAL HIGH (ref 11.5–15.5)
WBC: 5.2 10*3/uL (ref 4.0–10.5)
nRBC: 0 % (ref 0.0–0.2)

## 2019-07-30 LAB — PROTIME-INR
INR: 1.3 — ABNORMAL HIGH (ref 0.8–1.2)
Prothrombin Time: 16 seconds — ABNORMAL HIGH (ref 11.4–15.2)

## 2019-07-30 MED ORDER — DOXYCYCLINE HYCLATE 100 MG PO CAPS
100.0000 mg | ORAL_CAPSULE | Freq: Two times a day (BID) | ORAL | 0 refills | Status: DC
Start: 2019-07-30 — End: 2019-09-08

## 2019-07-30 NOTE — ED Triage Notes (Signed)
Pt came in EMS for swelling in abdomin. Some nausea noted.

## 2019-07-30 NOTE — ED Provider Notes (Addendum)
[facility] EMERGENCY DEPARTMENT Provider Note   CSN: [id number] Arrival date & time: 07/30/19  1519     History Chief Complaint: Abdominal swelling  Brittany Russo is a 74 y.o. female with a history of liver cirrhosis with ascites requiring prior paracentesis, prior acute hepatic encephalopathy, diabetes mellitus, prior GI bleed, hypertension, hyperlipidemia, thrombocytopenia, and prior hysterectomy who presents to the ED with complaints of increased abdominal swelling since yesterday. Patient states she was recently discharged from the hospital after being in rehab s/p RLE AKA. She states she was doing well at time of discharge but yesterday started to note her abdomen to be distended/swollen, slowly worsening, no specific alleviating/aggravating factors. Taking all meds as prescribed. Has had some associated nausea. Reports waxing/waning loose stools but that is baseline for her.  She is having some continued discomfort to her right lower extremity AKA site which has not acutely changed since leaving rehab.  Denies fever, chills, abdominal pain, chest pain, dyspnea, melena, hematochezia, constipation, dysuria, or urinary retention.  Her daughter at bedside confirms above history.  Per chart review for additional history: Patient had recent hospitalization 06/29/2019 through 07/04/2019 for right AKA by Dr. Alvan Dame. Surgical intervention was performed 04/08, discharged to rehab 04/13. It appears she had IR guided right-sided thoracentesis 04/28 as well as IR guided paracentesis from the right lateral abdomen 07/21/2019 which yielded approximately 1.4 L of fluid. GI & hospitalist service provided recommendations- IV lasix was increased from 20 mg daily to 40 mg, subsequently transitioned to oral. Discharged on 20 mg of Lasix BID and 100 mg of Spironolactone daily.   HPI     Past Medical History:  Diagnosis Date  . Acute and subacute hepatic failure without coma   . Allergy   . Anasarca   .  Anxiety   . Arthritis   . Ascites 09/2018   no SBP on 850 cc tap 10/13/18  . Asthma    allery induced  . Cirrhosis of liver ([place]) ~ 2004   from NAFLD.  Dx ~ 2004.  Dr Dorcas Mcmurray at [facility]/    . Depression   . Diabetes mellitus without complication ([place])    type 2  . Eczema of both hands   . Endometrial cancer ([place]) 1996, 2003   hysterectomy 1996.  recurrence 2003, treated with radiation.    . Generalized edema   . GI hemorrhage   . 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 in [place]  . Hyperlipidemia   . Hypertension   . Muscle weakness   . Seizures ([place])    last seizure June 07, 2014   . Thrombocytopenia ([place])   . Thyroid nodule     Patient Active Problem List   Diagnosis Date Noted  . Radiation proctitis 07/28/2019  . Status post thoracentesis   . Acute on chronic anemia   . Pleural effusion   . Supratherapeutic INR   . Urinary retention   . Radiation cystitis   . Labile blood pressure   . Labile blood glucose   . Anxiety state   . Controlled type 2 diabetes mellitus with hyperglycemia, with long-term current use of insulin ([place])   . Hyponatremia   . Thrombocytopenia ([place])   . Phantom limb pain ([place])   . Postoperative pain   . Amputation above knee ([place]) 07/04/2019  . S/P right knee disarticulation amputation 06/29/2019  . Above knee amputation of right lower extremity ([place]) 06/29/2019  . Hyperglycemia   .  Failed total right knee replacement ([place])   . Hepatic encephalopathy ([place]) 12/28/2018  . UTI (urinary tract infection) 12/28/2018  . Bradycardia 12/28/2018  . Hyperkalemia 12/28/2018  . Hematuria 11/23/2018  . Overweight (BMI 25.0-29.9) 11/23/2018  . Mechanical problem with extremity 11/22/2018  . Cirrhosis of liver with ascites ([place]) 11/09/2018  . Nausea without vomiting 10/26/2018  . Ascites   . Rectal bleeding   . AKI (acute kidney injury) ([place]) 10/11/2018  . Lower GI bleed 10/10/2018  .  Anasarca 10/10/2018  . Hypoalbuminemia 10/10/2018  . Depression 09/28/2018  . Acute hepatic encephalopathy 09/28/2018  . Cirrhosis ([place]) 09/28/2018  . Periprosthetic fracture around internal prosthetic right knee joint 09/24/2018  . S/P right TKA 09/22/2018  . Status post total knee replacement, right 09/22/2018  . DM2 (diabetes mellitus, type 2) ([place]) 07/11/2015  . Hypertension 07/11/2015  . Hyperlipidemia 07/11/2015    Past Surgical History:  Procedure Laterality Date  . AMPUTATION Right 06/29/2019   Procedure: AMPUTATION ABOVE KNEE through knee;  Surgeon: Paralee Cancel, MD;  Location: [facility];  Service: Orthopedics;  Laterality: Right;  2 hrs  . CATARACT EXTRACTION W/PHACO Left 06/04/2017   Procedure: CATARACT EXTRACTION PHACO AND INTRAOCULAR LENS PLACEMENT (IOC);  Surgeon: Baruch Goldmann, MD;  Location: [facility];  Service: Ophthalmology;  Laterality: Left;  CDE: 3.90  . CATARACT EXTRACTION W/PHACO Right 07/15/2017   Procedure: CATARACT EXTRACTION PHACO AND INTRAOCULAR LENS PLACEMENT (IOC);  Surgeon: Baruch Goldmann, MD;  Location: [facility];  Service: Ophthalmology;  Laterality: Right;  CDE: 7.60  . COLONOSCOPY  06/2007   in [place] for hematochzia.  radiation proctitis  . DILATION AND CURETTAGE OF UTERUS    . ESOPHAGOGASTRODUODENOSCOPY  2016  . ESOPHAGOGASTRODUODENOSCOPY (EGD) WITH PROPOFOL N/A 12/30/2018   Procedure: EGD;  Surgeon: Clarene Essex, MD;  Location: [facility];  Service: Endoscopy;  Laterality: N/A;  . EXCISIONAL TOTAL KNEE ARTHROPLASTY Right 01/03/2019   Procedure: EXCISIONAL TOTAL KNEE ARTHROPLASTY;  Surgeon: Paralee Cancel, MD;  Location: [facility];  Service: Orthopedics;  Laterality: Right;  . FLEXIBLE SIGMOIDOSCOPY N/A 12/30/2018   Procedure: FLEXIBLE SIGMOIDOSCOPY;  Surgeon: Clarene Essex, MD;  Location: [facility];  Service: Endoscopy;  Laterality: N/A;  . IR PARACENTESIS  10/13/2018  . IR PARACENTESIS  07/21/2019  . IR THORACENTESIS ASP PLEURAL SPACE W/IMG GUIDE  07/19/2019  .  STERIOD INJECTION Left 06/29/2019   Procedure: LEFT KNEE INJECTION;  Surgeon: Paralee Cancel, MD;  Location: [facility];  Service: Orthopedics;  Laterality: Left;  . TOTAL ABDOMINAL HYSTERECTOMY  1996  . TOTAL KNEE ARTHROPLASTY Right 09/22/2018   Procedure: TOTAL KNEE ARTHROPLASTY;  Surgeon: Paralee Cancel, MD;  Location: [facility];  Service: Orthopedics;  Laterality: Right;  70 mins  . TOTAL KNEE REVISION Right 09/27/2018   Procedure: TOTAL KNEE REVISION;  Surgeon: Paralee Cancel, MD;  Location: [facility];  Service: Orthopedics;  Laterality: Right;  . TOTAL KNEE REVISION Right 11/22/2018   Procedure: repair right knee extensor mechanism;  Surgeon: Paralee Cancel, MD;  Location: [facility];  Service: Orthopedics;  Laterality: Right;  90 mins     OB History   No obstetric history on file.     Family History  Problem Relation Age of Onset  . Heart disease Mother   . Diabetes Mother   . Heart disease Father   . Cancer Father   . Cancer Maternal Grandmother   . Heart disease Maternal Grandfather     Social History   Tobacco Use  . Smoking status: Never Smoker  .  Smokeless tobacco: Never Used  Substance Use Topics  . Alcohol use: No  . Drug use: No    Home Medications Prior to Admission medications   Medication Sig Start Date End Date Taking? Authorizing Provider  acetaminophen (TYLENOL) 325 MG tablet Take 1-2 tablets (325-650 mg total) by mouth every 4 (four) hours as needed for mild pain. 07/13/19   Love, Ivan Anchors, PA-C  albuterol (VENTOLIN HFA) 108 (90 Base) MCG/ACT inhaler Inhale 1-2 puffs into the lungs every 6 (six) hours as needed for wheezing or shortness of breath.    [provider]  ALPRAZolam Duanne Moron) 0.25 MG tablet Take 1 tablet (0.25 mg total) by mouth 2 (two) times daily. 01/06/19   Mariel Aloe, MD  atenolol (TENORMIN) 50 MG tablet Take 50 mg by mouth daily.    [provider]  atorvastatin (LIPITOR) 80 MG tablet Take 80 mg by mouth daily at 8 pm.    [provider]  buPROPion (WELLBUTRIN XL) 150 MG 24 hr tablet Take 150 mg by mouth daily.    [provider]  escitalopram (LEXAPRO) 20 MG tablet Take 20 mg by mouth daily. 05/17/19   [provider]  ferrous sulfate (FERROUSUL) 325 (65 FE) MG tablet Take 1 tablet (325 mg total) by mouth 3 (three) times daily with meals for 14 days. 07/03/19 07/17/19  Danae Orleans, PA-C  ferrous sulfate (FERROUSUL) 325 (65 FE) MG tablet Take 1 tablet (325 mg total) by mouth 3 (three) times daily with meals. 07/27/19 08/26/19  Love, Ivan Anchors, PA-C  fluticasone (FLONASE) 50 MCG/ACT nasal spray Place 1 spray into both nostrils daily.     [provider]  furosemide (LASIX) 20 MG tablet Take 1 tablet (20 mg total) by mouth 2 (two) times daily. 07/27/19   Love, Ivan Anchors, PA-C  gabapentin (NEURONTIN) 300 MG capsule Take 1 capsule (300 mg total) by mouth 2 (two) times daily. 07/27/19   Love, Ivan Anchors, PA-C  HYDROcodone-acetaminophen (NORCO) 7.5-325 MG tablet Take 1 tablet by mouth every 12 (twelve) hours as needed for severe pain. 07/27/19   Love, Ivan Anchors, PA-C  Insulin Glargine (BASAGLAR KWIKPEN) 100 UNIT/ML Inject 20-30 Units into the skin daily.    [provider]  lactulose (CHRONULAC) 10 GM/15ML solution Take 22.5 mLs (15 g total) by mouth 2 (two) times daily. 07/27/19   Love, Ivan Anchors, PA-C  lip balm (CARMEX) ointment Apply topically as needed for lip care. 07/13/19   Love, Ivan Anchors, PA-C  loratadine (CLARITIN) 10 MG tablet Take 10 mg by mouth daily.    [provider]  mesalamine (CANASA) 1000 MG suppository Place 1 suppository (1,000 mg total) rectally at bedtime. 07/27/19   Love, Ivan Anchors, PA-C  methocarbamol (ROBAXIN) 500 MG tablet Take 1 tablet (500 mg total) by mouth every 6 (six) hours as needed for muscle spasms. 07/27/19   Love, Ivan Anchors, PA-C  Multiple Vitamin (MULTIVITAMIN WITH MINERALS) TABS tablet Take 1 tablet by mouth daily. 12/06/18   Danae Orleans, PA-C  polyethylene  glycol (MIRALAX / GLYCOLAX) 17 g packet Take 17 g by mouth daily as needed. 07/27/19   Love, Ivan Anchors, PA-C  potassium chloride (KLOR-CON) 10 MEQ tablet Take 1 tablet (10 mEq total) by mouth daily. 07/27/19   Love, Ivan Anchors, PA-C  rifaximin (XIFAXAN) 550 MG TABS tablet Take 550 mg by mouth 2 (two) times daily.    [provider]  sitaGLIPtin-metformin (JANUMET) 50-500 MG per tablet Take 1 tablet by  mouth 2 (two) times daily with a meal.    [provider]  spironolactone (ALDACTONE) 100 MG tablet Take 1 tablet (100 mg total) by mouth daily. 07/28/19   Love, Ivan Anchors, PA-C  traMADol (ULTRAM) 50 MG tablet Take 1 tablet (50 mg total) by mouth every 6 (six) hours as needed for moderate pain. 07/27/19   Love, Ivan Anchors, PA-C  traZODone (DESYREL) 50 MG tablet Take 50 mg by mouth at bedtime as needed for sleep.  05/17/19   [provider]  Ibuprofen-Diphenhydramine Cit (ADVIL PM PO) Take 60 mg by mouth at bedtime.  05/21/17  [provider]    Allergies    Asa [aspirin] and Other  Review of Systems   Review of Systems  Constitutional: Negative for chills and fever.  Respiratory: Negative for shortness of breath.   Cardiovascular: Negative for chest pain.  Gastrointestinal: Positive for abdominal distention, diarrhea and nausea. Negative for abdominal pain, anal bleeding, blood in stool, constipation and vomiting.  Genitourinary: Negative for difficulty urinating and dysuria.  Musculoskeletal: Positive for myalgias.  Neurological: Negative for syncope.  All other systems reviewed and are negative.   Physical Exam Updated Vital Signs BP 130/61   Pulse 72   Temp 98 F (36.7 C) (Oral)   Resp 18   Ht 5' 2"  (1.575 m)   Wt 73.5 kg   SpO2 99%   BMI 29.63 kg/m   Physical Exam Vitals and nursing note reviewed.  Constitutional:      General: She is not in acute distress.    Appearance: She is well-developed. She is not toxic-appearing.  HENT:     Head: Normocephalic  and atraumatic.  Eyes:     General:        Right eye: No discharge.        Left eye: No discharge.     Conjunctiva/sclera: Conjunctivae normal.  Cardiovascular:     Rate and Rhythm: Normal rate and regular rhythm.  Pulmonary:     Effort: Pulmonary effort is normal. No respiratory distress.     Breath sounds: Normal breath sounds. No wheezing, rhonchi or rales.  Abdominal:     General: There is distension (Mild).     Palpations: Abdomen is soft.     Tenderness: There is abdominal tenderness (LLQ). There is no guarding or rebound.  Musculoskeletal:     Cervical back: Neck supple.     Comments: Left lower extremity with 1+ edema to the lower leg.  Right lower extremity status post AKA, amputation site with very mild erythema present, small dehiscence noted, there is some serosanguineous and slightly purulent drainage from the wound with palpation.  Mild tenderness noted.    Skin:    General: Skin is warm and dry.     Findings: No rash.  Neurological:     Mental Status: She is alert.     Comments: Clear speech.   Psychiatric:        Behavior: Behavior normal.     ED Results / Procedures / Treatments   Labs (all labs ordered are listed, but only abnormal results are displayed) Labs Reviewed - No data to display  EKG None  Radiology CT Abdomen Pelvis Wo Contrast  Result Date: 07/30/2019 CLINICAL DATA:  Abdominal pain. Abdominal swelling. History of cirrhosis of the liver. Hysterectomy for endometrial cancer. EXAM: CT ABDOMEN AND PELVIS WITHOUT CONTRAST TECHNIQUE: Multidetector CT imaging of the abdomen and pelvis was performed following the standard protocol without IV contrast. COMPARISON:  December 02, 2018 FINDINGS: Lower chest: Moderate in size right pleural effusion with subsegmental atelectasis in the right lung base. Nonspecific ground-glass opacity left upper lobe. Nonspecific subpleural nodularity in the left lower lobe. Enlarged heart. Hepatobiliary: Cirrhotic appearance  of the liver. The gallbladder is normal. Pancreas: Unremarkable. No pancreatic ductal dilatation or surrounding inflammatory changes. Spleen: Borderline splenomegaly. Adrenals/Urinary Tract: Adrenal glands are unremarkable. Kidneys are normal, without renal calculi, focal lesion, or hydronephrosis. Bladder is unremarkable. Stomach/Bowel: Stomach is within normal limits. Appendix appears normal. No evidence of bowel wall thickening, distention, or inflammatory changes. Vascular/Lymphatic: Aortic atherosclerosis. No enlarged abdominal or pelvic lymph nodes. Reproductive: Status post hysterectomy. No adnexal masses. Other: Large volume abdominopelvic ascites.  Anasarca. Musculoskeletal: Spondylosis of the spine. IMPRESSION: 1. Large volume abdominopelvic ascites. 2. Cirrhotic appearance of the liver. Borderline splenomegaly. 3. Moderate in size right pleural effusion with subsegmental atelectasis in the right lung base. 4. Nonspecific ground-glass opacity in the left upper lobe and subpleural nodularity in the left lower lobe. Aortic Atherosclerosis (ICD10-I70.0). Electronically Signed   By: Fidela Salisbury M.D.   On: 07/30/2019 17:16   DG Chest 2 View  Result Date: 07/30/2019 CLINICAL DATA:  Right pleural effusion. EXAM: CHEST - 2 VIEW COMPARISON:  July 20, 2019 FINDINGS: The cardiac silhouette is enlarged. There is no evidence of focal airspace consolidation, or pneumothorax. A moderate right pleural effusion. Osseous structures are without acute abnormality. Soft tissues are grossly normal. IMPRESSION: 1. Enlarged cardiac silhouette. 2. Moderate right pleural effusion. Electronically Signed   By: Fidela Salisbury M.D.   On: 07/30/2019 17:25   DG Femur Min 2 Views Right  Result Date: 07/30/2019 CLINICAL DATA:  Status post above the knee amputation. EXAM: RIGHT FEMUR 2 VIEWS COMPARISON:  September 29, 2018 FINDINGS: There is evidence of interval above the knee amputation since the prior study. There is no  evidence of an acute fracture. Evidence of prior instrumentation of a portion of the distal right femoral shaft is seen. A 4.5 cm x 6.1 cm well-defined area of cortical density is also seen surrounding the distal right femoral shaft. 7 mm and 8 mm superficial soft tissue defects are seen along the anteromedial aspect of the surgical stump site. IMPRESSION: 1. Status post above the knee amputation. 2. Large well-defined area of cortical density surrounding the distal right femoral shaft. While this may be postsurgical in nature, correlation with MRI is recommended to exclude the presence of an underlying aggressive lesion. Electronically Signed   By: Virgina Norfolk M.D.   On: 07/30/2019 17:05    Procedures Procedures (including critical care time)  Medications Ordered in ED Medications - No data to display  ED Course  I have reviewed the triage vital signs and the nursing notes.  Pertinent labs & imaging results that were available during my care of the patient were reviewed by me and considered in my medical decision making (see chart for details).    MDM Rules/Calculators/A&P                     Patient presents to the ED with concerns for abdominal distention since yesterday.  Patient is nontoxic, resting comfortably, vitals without significant abnormality.  Additional history obtained:  Additional history obtained from patient's daughter at bedside. Previous records obtained and reviewed.   Lab Tests:  I Ordered, reviewed, and interpreted labs, which included:  CBC: Anemia and thrombocytopenia similar to prior ranges.  She has no leukocytosis. CMP: Liver function test and total  bili are fairly similar to prior.  She is hypoalbuminemic, improved from last labs on record.  Renal function within normal limits. Lipase: Within normal limits. PT/INR similar to prior. Urinalysis: No obvious UTI, no urinary sxs.   Imaging Studies ordered:  I ordered imaging studies which included Xrays  of the chest & right femur as well as CT abdomen/pelvis wo contrast, I independently visualized and interpreted imaging which showed:  Right femur xray: 1. Status post above the knee amputation. 2. Large well-defined area of cortical density surrounding the distal right femoral shaft. While this may be postsurgical in nature, correlation with MRI is recommended to exclude the presence of an underlying aggressive lesion Chest xray: 1. Enlarged cardiac silhouette. 2. Moderate right pleural effusion. CT abdomen/pelvis: 1. Large volume abdominopelvic ascites. 2. Cirrhotic appearance of the liver. Borderline splenomegaly. 3. Moderate in size right pleural effusion with subsegmental atelectasis in the right lung base. 4. Nonspecific ground-glass opacity in the left upper lobe and subpleural nodularity in the left lower lobe. Aortic Atherosclerosis  ED Course:  Patient's labs overall appear fairly consistent with her baseline ranges.  She does have findings of fluid in terms of ascites as well as a moderate right-sided pleural effusion per radiology interpretation. Bedside US performed with supervising physician Dr. Reola Calkins does have a degree of ascites most notably deep in the pelvis around the bladder, minimal to RLQ/LLQ evaluation, this does not appear to require emergent paracentesis at this time. She is not in any respiratory distress, denied pain prior to assessment.  Her CT scan does not show findings of diverticulitis, colitis, perforation, obstruction or other acute surgical abdominal pathology at this time.  Her right femur x-ray reveals findings this is status post AKA as well as large well-defined area of cortical density surrounding the distal right femoral shaft. While this may be postsurgical in nature, correlation with MRI is recommended to exclude the presence of an underlying aggressive lesion--> feel this can be performed as an outpatient.  In regards to the mild purulent drainage noted to  her incision site, she does not appear septic, she is afebrile, no leukocytosis, she overall appears appropriate for discharge, will start on doxycycline and have her follow-up closely with her orthopedic surgeon Dr. Alvan Dame. I discussed results, treatment plan, need for follow-up, and return precautions with the patient & her daughter at bedside. Provided opportunity for questions, patient & her daughter confirmed understanding and are in agreement with plan.   This is a shared visit with supervising physician Dr. Sabra Heck who has independently evaluated patient & provided guidance in evaluation/management/disposition, in agreement with care   Portions of this note were generated with Dragon dictation software. Dictation errors may occur despite best attempts at proofreading.  Final Clinical Impression(s) / ED Diagnoses Final diagnoses:  Other ascites  Surgical site infection    Rx / DC Orders ED Discharge Orders         Ordered    doxycycline (VIBRAMYCIN) 100 MG capsule  2 times daily     07/30/19 [address] PA-C 07/30/19 1925    Leafy Kindle 07/30/19 1925    Noemi Chapel, MD 07/31/19 1446

## 2019-07-30 NOTE — ED Provider Notes (Signed)
This patient is a pleasant 74 year old female accompanied by her daughter.  Evidently the patient recently had a right below the knee amputation disarticulation, she was hospitalized for several days and then sent to rehab, she has been home for 2 days but because of abdominal swelling comes in.  According to the daughter the patient has had abdominal swelling ever since the surgery, she did require paracentesis and thoracentesis while she was in the hospital most recently on July 21, 2019.  On my exam the patient does have slight distention of her abdomen, minimal left lower quadrant tenderness, no guarding or peritoneal signs.  She has mild edema of the left leg and her amputation site appears minimally erythematous around the incision site, the wound has a very slight dehiscence with both a serosanguineous as well as a slight purulent discharge from the wound.  Minimally tender.  No signs of swelling of the leg, no crepitance or subcutaneous emphysema, it is not hot to the touch.  Heart and lung exams are unremarkable, the patient's demeanor is awake alert and bright.  She understands the testing including CT scan of the abdomen and pelvis to look for other causes of abdominal pain and distention as well as a plain film of her stump site.  Anticipate antibiotics for the stump and follow-up with orthopedics, I suspect that she is third spacing fluid secondary to her poor liver function having a history of nonalcoholic cirrhosis.  She is agreeable to the plan and looks very stable at this time with vital signs that do not reflect hypotension tachycardia or fever.  Medical screening examination/treatment/procedure(s) were conducted as a shared visit with non-physician practitioner(s) and myself.  I personally evaluated the patient during the encounter.  Clinical Impression:   Final diagnoses:  Other ascites  Surgical site infection         Noemi Chapel, MD 07/31/19 1446

## 2019-07-30 NOTE — Discharge Instructions (Addendum)
You were seen in the emergency department today for abdominal swelling.  Your work-up was overall reassuring.  Your labs look fairly similar to prior blood work you have had done.  The x-ray of your chest and CT of your abdomen do show that you have some fluid at the bottom of your right lung as well as some fluid in your abdomen more so in the pelvic area.  Please continue to take your fluid pills at home.  Please follow-up with your primary care doctor within 3 days for reevaluation of this.  Due to the drainage you had at your surgical site we are starting you on doxycycline, an antibiotic, to treat this infection.  Please take all of your antibiotics until finished. You may develop abdominal discomfort or diarrhea from the antibiotic.  You may help offset this with probiotics which you can buy at the store (ask your pharmacist if unable to find) or get probiotics in the form of eating yogurt. Do not eat or take the probiotics until 2 hours after your antibiotic. If you are unable to tolerate these side effects follow-up with your primary care provider or return to the emergency department.   If you begin to experience any blistering, rashes, swelling, or difficulty breathing seek medical care for evaluation of potentially more serious side effects.   Please be aware that this medication may interact with other medications you are taking, please be sure to discuss your medication list with your pharmacist.    X-ray performed of your right thigh area did show some changes that are likely related to your recent surgery, however you may need an MRI to further assess this area- please discuss this with Dr. Alvan Dame or your primary care provider. ,  Please follow-up with your primary care provider within 3 days.  Please follow-up with Dr. Aurea Graff office within the next 1 week for recheck of your surgical site.  Return to the emergency department for new or worsening symptoms including but not limited to  increased swelling, trouble breathing, inability to urinate, fever, increased drainage from your surgical wound, redness around the surgical wound, or any other concerns that you may have.

## 2019-07-31 ENCOUNTER — Telehealth (INDEPENDENT_AMBULATORY_CARE_PROVIDER_SITE_OTHER): Payer: Self-pay | Admitting: Internal Medicine

## 2019-07-31 NOTE — Telephone Encounter (Signed)
report faxed to PCP  

## 2019-07-31 NOTE — Telephone Encounter (Signed)
Patients daughter called needs patients records faxed to Dr Clarene Essex with [facility] - fax# [phone] - they can see her sooner

## 2019-08-03 ENCOUNTER — Telehealth: Payer: Self-pay

## 2019-08-03 DIAGNOSIS — E871 Hypo-osmolality and hyponatremia: Secondary | ICD-10-CM | POA: Diagnosis not present

## 2019-08-03 DIAGNOSIS — K7460 Unspecified cirrhosis of liver: Secondary | ICD-10-CM | POA: Diagnosis not present

## 2019-08-03 DIAGNOSIS — G546 Phantom limb syndrome with pain: Secondary | ICD-10-CM | POA: Diagnosis not present

## 2019-08-03 DIAGNOSIS — D649 Anemia, unspecified: Secondary | ICD-10-CM | POA: Diagnosis not present

## 2019-08-03 DIAGNOSIS — F419 Anxiety disorder, unspecified: Secondary | ICD-10-CM | POA: Diagnosis not present

## 2019-08-03 DIAGNOSIS — S78119A Complete traumatic amputation at level between unspecified hip and knee, initial encounter: Secondary | ICD-10-CM | POA: Diagnosis not present

## 2019-08-03 NOTE — Telephone Encounter (Signed)
Transitional Care call--John/Husband    1. Are you/is patient experiencing any problems since coming home? Nausea Are there any questions regarding any aspect of care? No 2. Are there any questions regarding medications administration/dosing? No Are meds being taken as prescribed? Yes Patient should review meds with caller to confirm 3. Have there been any falls? No 4. Has Home Health been to the house and/or have they contacted you? Yes If not, have you tried to contact them? Can we help you contact them? 5. Are bowels and bladder emptying properly? Yes Are there any unexpected incontinence issues?  No If applicable, is patient following bowel/bladder programs? 6. Any fevers, problems with breathing, unexpected pain? No 7. Are there any skin problems or new areas of breakdown? No 8. Has the patient/family member arranged specialty MD follow up (ie cardiology/neurology/renal/surgical/etc)? Yes  Can we help arrange? 9. Does the patient need any other services or support that we can help arrange? No 10. Are caregivers following through as expected in assisting the patient? Yes 11. Has the patient quit smoking, drinking alcohol, or using drugs as recommended? Yes  Appointment time 2:00 pm, arrive time 1:40 pm with Dr. Posey Pronto [phone]

## 2019-08-07 ENCOUNTER — Encounter: Payer: Medicare Other | Admitting: Physical Medicine & Rehabilitation

## 2019-08-09 DIAGNOSIS — K7290 Hepatic failure, unspecified without coma: Secondary | ICD-10-CM | POA: Diagnosis not present

## 2019-08-10 DIAGNOSIS — N390 Urinary tract infection, site not specified: Secondary | ICD-10-CM | POA: Diagnosis not present

## 2019-08-11 ENCOUNTER — Other Ambulatory Visit: Payer: Self-pay | Admitting: Physical Medicine and Rehabilitation

## 2019-08-11 DIAGNOSIS — M25562 Pain in left knee: Secondary | ICD-10-CM | POA: Diagnosis not present

## 2019-08-11 DIAGNOSIS — Z4789 Encounter for other orthopedic aftercare: Secondary | ICD-10-CM | POA: Diagnosis not present

## 2019-08-11 DIAGNOSIS — M1712 Unilateral primary osteoarthritis, left knee: Secondary | ICD-10-CM | POA: Diagnosis not present

## 2019-08-14 ENCOUNTER — Encounter: Payer: Medicare Other | Admitting: Physical Medicine & Rehabilitation

## 2019-08-16 DIAGNOSIS — K7469 Other cirrhosis of liver: Secondary | ICD-10-CM | POA: Diagnosis not present

## 2019-08-16 DIAGNOSIS — K627 Radiation proctitis: Secondary | ICD-10-CM | POA: Diagnosis not present

## 2019-08-21 DIAGNOSIS — E7849 Other hyperlipidemia: Secondary | ICD-10-CM | POA: Diagnosis not present

## 2019-08-21 DIAGNOSIS — I10 Essential (primary) hypertension: Secondary | ICD-10-CM | POA: Diagnosis not present

## 2019-08-21 DIAGNOSIS — K7581 Nonalcoholic steatohepatitis (NASH): Secondary | ICD-10-CM | POA: Diagnosis not present

## 2019-08-21 DIAGNOSIS — E1165 Type 2 diabetes mellitus with hyperglycemia: Secondary | ICD-10-CM | POA: Diagnosis not present

## 2019-08-22 ENCOUNTER — Other Ambulatory Visit: Payer: Self-pay | Admitting: Physical Medicine and Rehabilitation

## 2019-08-22 DIAGNOSIS — K7460 Unspecified cirrhosis of liver: Secondary | ICD-10-CM | POA: Diagnosis not present

## 2019-08-24 DIAGNOSIS — K7469 Other cirrhosis of liver: Secondary | ICD-10-CM | POA: Diagnosis not present

## 2019-08-27 DIAGNOSIS — G546 Phantom limb syndrome with pain: Secondary | ICD-10-CM | POA: Diagnosis not present

## 2019-08-27 DIAGNOSIS — S78119A Complete traumatic amputation at level between unspecified hip and knee, initial encounter: Secondary | ICD-10-CM | POA: Diagnosis not present

## 2019-08-27 DIAGNOSIS — E871 Hypo-osmolality and hyponatremia: Secondary | ICD-10-CM | POA: Diagnosis not present

## 2019-08-27 DIAGNOSIS — K7460 Unspecified cirrhosis of liver: Secondary | ICD-10-CM | POA: Diagnosis not present

## 2019-08-27 DIAGNOSIS — F419 Anxiety disorder, unspecified: Secondary | ICD-10-CM | POA: Diagnosis not present

## 2019-08-27 DIAGNOSIS — D649 Anemia, unspecified: Secondary | ICD-10-CM | POA: Diagnosis not present

## 2019-08-28 DIAGNOSIS — Z4781 Encounter for orthopedic aftercare following surgical amputation: Secondary | ICD-10-CM | POA: Diagnosis not present

## 2019-08-29 DIAGNOSIS — E1165 Type 2 diabetes mellitus with hyperglycemia: Secondary | ICD-10-CM | POA: Diagnosis not present

## 2019-08-29 DIAGNOSIS — K760 Fatty (change of) liver, not elsewhere classified: Secondary | ICD-10-CM | POA: Diagnosis not present

## 2019-08-29 DIAGNOSIS — D696 Thrombocytopenia, unspecified: Secondary | ICD-10-CM | POA: Diagnosis not present

## 2019-08-29 DIAGNOSIS — D62 Acute posthemorrhagic anemia: Secondary | ICD-10-CM | POA: Diagnosis not present

## 2019-08-29 DIAGNOSIS — Z89611 Acquired absence of right leg above knee: Secondary | ICD-10-CM | POA: Diagnosis not present

## 2019-08-29 DIAGNOSIS — K7469 Other cirrhosis of liver: Secondary | ICD-10-CM | POA: Diagnosis not present

## 2019-08-29 DIAGNOSIS — Z4781 Encounter for orthopedic aftercare following surgical amputation: Secondary | ICD-10-CM | POA: Diagnosis not present

## 2019-08-29 DIAGNOSIS — J918 Pleural effusion in other conditions classified elsewhere: Secondary | ICD-10-CM | POA: Diagnosis not present

## 2019-09-06 ENCOUNTER — Inpatient Hospital Stay (HOSPITAL_COMMUNITY)
Admission: AD | Admit: 2019-09-06 | Discharge: 2019-09-08 | DRG: 441 | Disposition: A | Discharge status: Home Health | Payer: Medicare Other | Attending: Internal Medicine | Admitting: Internal Medicine

## 2019-09-06 ENCOUNTER — Emergency Department (HOSPITAL_COMMUNITY): Payer: Medicare Other

## 2019-09-06 ENCOUNTER — Other Ambulatory Visit: Payer: Self-pay

## 2019-09-06 ENCOUNTER — Encounter (HOSPITAL_COMMUNITY): Payer: Self-pay

## 2019-09-06 DIAGNOSIS — Z923 Personal history of irradiation: Secondary | ICD-10-CM

## 2019-09-06 DIAGNOSIS — E11649 Type 2 diabetes mellitus with hypoglycemia without coma: Secondary | ICD-10-CM | POA: Diagnosis not present

## 2019-09-06 DIAGNOSIS — R109 Unspecified abdominal pain: Secondary | ICD-10-CM | POA: Diagnosis present

## 2019-09-06 DIAGNOSIS — E1165 Type 2 diabetes mellitus with hyperglycemia: Secondary | ICD-10-CM | POA: Diagnosis present

## 2019-09-06 DIAGNOSIS — R188 Other ascites: Secondary | ICD-10-CM | POA: Diagnosis present

## 2019-09-06 DIAGNOSIS — K7200 Acute and subacute hepatic failure without coma: Secondary | ICD-10-CM | POA: Diagnosis not present

## 2019-09-06 DIAGNOSIS — K7460 Unspecified cirrhosis of liver: Secondary | ICD-10-CM | POA: Diagnosis present

## 2019-09-06 DIAGNOSIS — D696 Thrombocytopenia, unspecified: Secondary | ICD-10-CM | POA: Diagnosis present

## 2019-09-06 DIAGNOSIS — E7220 Disorder of urea cycle metabolism, unspecified: Secondary | ICD-10-CM | POA: Diagnosis present

## 2019-09-06 DIAGNOSIS — Z79899 Other long term (current) drug therapy: Secondary | ICD-10-CM

## 2019-09-06 DIAGNOSIS — I119 Hypertensive heart disease without heart failure: Secondary | ICD-10-CM | POA: Diagnosis present

## 2019-09-06 DIAGNOSIS — Z89611 Acquired absence of right leg above knee: Secondary | ICD-10-CM

## 2019-09-06 DIAGNOSIS — E872 Acidosis, unspecified: Secondary | ICD-10-CM

## 2019-09-06 DIAGNOSIS — Z96651 Presence of right artificial knee joint: Secondary | ICD-10-CM

## 2019-09-06 DIAGNOSIS — R7401 Elevation of levels of liver transaminase levels: Secondary | ICD-10-CM | POA: Diagnosis present

## 2019-09-06 DIAGNOSIS — E785 Hyperlipidemia, unspecified: Secondary | ICD-10-CM | POA: Diagnosis present

## 2019-09-06 DIAGNOSIS — Z886 Allergy status to analgesic agent status: Secondary | ICD-10-CM

## 2019-09-06 DIAGNOSIS — Z79891 Long term (current) use of opiate analgesic: Secondary | ICD-10-CM

## 2019-09-06 DIAGNOSIS — J302 Other seasonal allergic rhinitis: Secondary | ICD-10-CM | POA: Diagnosis present

## 2019-09-06 DIAGNOSIS — Z9114 Patient's other noncompliance with medication regimen: Secondary | ICD-10-CM

## 2019-09-06 DIAGNOSIS — Z888 Allergy status to other drugs, medicaments and biological substances status: Secondary | ICD-10-CM

## 2019-09-06 DIAGNOSIS — I517 Cardiomegaly: Secondary | ICD-10-CM | POA: Diagnosis not present

## 2019-09-06 DIAGNOSIS — E869 Volume depletion, unspecified: Secondary | ICD-10-CM | POA: Diagnosis present

## 2019-09-06 DIAGNOSIS — K7290 Hepatic failure, unspecified without coma: Secondary | ICD-10-CM

## 2019-09-06 DIAGNOSIS — F329 Major depressive disorder, single episode, unspecified: Secondary | ICD-10-CM | POA: Diagnosis present

## 2019-09-06 DIAGNOSIS — K760 Fatty (change of) liver, not elsewhere classified: Secondary | ICD-10-CM | POA: Diagnosis present

## 2019-09-06 DIAGNOSIS — G9341 Metabolic encephalopathy: Secondary | ICD-10-CM | POA: Diagnosis present

## 2019-09-06 DIAGNOSIS — Z20822 Contact with and (suspected) exposure to covid-19: Secondary | ICD-10-CM | POA: Diagnosis present

## 2019-09-06 DIAGNOSIS — K7682 Hepatic encephalopathy: Secondary | ICD-10-CM | POA: Diagnosis present

## 2019-09-06 DIAGNOSIS — Z833 Family history of diabetes mellitus: Secondary | ICD-10-CM

## 2019-09-06 DIAGNOSIS — F419 Anxiety disorder, unspecified: Secondary | ICD-10-CM | POA: Diagnosis present

## 2019-09-06 DIAGNOSIS — R4189 Other symptoms and signs involving cognitive functions and awareness: Secondary | ICD-10-CM | POA: Diagnosis present

## 2019-09-06 DIAGNOSIS — Z794 Long term (current) use of insulin: Secondary | ICD-10-CM

## 2019-09-06 DIAGNOSIS — Z8542 Personal history of malignant neoplasm of other parts of uterus: Secondary | ICD-10-CM

## 2019-09-06 DIAGNOSIS — Z8249 Family history of ischemic heart disease and other diseases of the circulatory system: Secondary | ICD-10-CM

## 2019-09-06 DIAGNOSIS — R791 Abnormal coagulation profile: Secondary | ICD-10-CM | POA: Diagnosis present

## 2019-09-06 DIAGNOSIS — Z90710 Acquired absence of both cervix and uterus: Secondary | ICD-10-CM

## 2019-09-06 LAB — URINALYSIS, ROUTINE W REFLEX MICROSCOPIC
Bilirubin Urine: NEGATIVE
Glucose, UA: NEGATIVE mg/dL
Hgb urine dipstick: NEGATIVE
Ketones, ur: NEGATIVE mg/dL
Leukocytes,Ua: NEGATIVE
Nitrite: NEGATIVE
Protein, ur: NEGATIVE mg/dL
Specific Gravity, Urine: 1.009 (ref 1.005–1.030)
pH: 6 (ref 5.0–8.0)

## 2019-09-06 LAB — CBC WITH DIFFERENTIAL/PLATELET
Abs Immature Granulocytes: 0.01 10*3/uL (ref 0.00–0.07)
Basophils Absolute: 0 10*3/uL (ref 0.0–0.1)
Basophils Relative: 1 %
Eosinophils Absolute: 0.1 10*3/uL (ref 0.0–0.5)
Eosinophils Relative: 2 %
HCT: 34.2 % — ABNORMAL LOW (ref 36.0–46.0)
Hemoglobin: 10.4 g/dL — ABNORMAL LOW (ref 12.0–15.0)
Immature Granulocytes: 0 %
Lymphocytes Relative: 24 %
Lymphs Abs: 1 10*3/uL (ref 0.7–4.0)
MCH: 27.6 pg (ref 26.0–34.0)
MCHC: 30.4 g/dL (ref 30.0–36.0)
MCV: 90.7 fL (ref 80.0–100.0)
Monocytes Absolute: 0.4 10*3/uL (ref 0.1–1.0)
Monocytes Relative: 10 %
Neutro Abs: 2.7 10*3/uL (ref 1.7–7.7)
Neutrophils Relative %: 63 %
Platelets: 72 10*3/uL — ABNORMAL LOW (ref 150–400)
RBC: 3.77 MIL/uL — ABNORMAL LOW (ref 3.87–5.11)
RDW: 18.5 % — ABNORMAL HIGH (ref 11.5–15.5)
WBC: 4.2 10*3/uL (ref 4.0–10.5)
nRBC: 0 % (ref 0.0–0.2)

## 2019-09-06 LAB — COMPREHENSIVE METABOLIC PANEL
ALT: 68 U/L — ABNORMAL HIGH (ref 0–44)
AST: 85 U/L — ABNORMAL HIGH (ref 15–41)
Albumin: 2.6 g/dL — ABNORMAL LOW (ref 3.5–5.0)
Alkaline Phosphatase: 97 U/L (ref 38–126)
Anion gap: 12 (ref 5–15)
BUN: 13 mg/dL (ref 8–23)
CO2: 23 mmol/L (ref 22–32)
Calcium: 8.6 mg/dL — ABNORMAL LOW (ref 8.9–10.3)
Chloride: 97 mmol/L — ABNORMAL LOW (ref 98–111)
Creatinine, Ser: 0.73 mg/dL (ref 0.44–1.00)
GFR calc Af Amer: >60 mL/min (ref >60)
GFR calc non Af Amer: >60 mL/min (ref >60)
Glucose, Bld: 122 mg/dL — ABNORMAL HIGH (ref 70–99)
Potassium: 4.1 mmol/L (ref 3.5–5.1)
Sodium: 132 mmol/L — ABNORMAL LOW (ref 135–145)
Total Bilirubin: 1.3 mg/dL — ABNORMAL HIGH (ref 0.3–1.2)
Total Protein: 5.8 g/dL — ABNORMAL LOW (ref 6.5–8.1)

## 2019-09-06 LAB — LACTIC ACID, PLASMA
Lactic Acid, Venous: 3.6 mmol/L (ref 0.5–1.9)
Lactic Acid, Venous: 3 mmol/L (ref 0.5–1.9)

## 2019-09-06 LAB — SARS CORONAVIRUS 2 BY RT PCR (HOSPITAL ORDER, PERFORMED IN ~~LOC~~ HOSPITAL LAB): SARS Coronavirus 2: NEGATIVE

## 2019-09-06 LAB — AMMONIA: Ammonia: 80 umol/L — ABNORMAL HIGH (ref 9–35)

## 2019-09-06 MED ORDER — LACTULOSE 10 GM/15ML PO SOLN
20.0000 g | Freq: Once | ORAL | Status: AC
  Administered 2019-09-06: 20 g via ORAL
  Filled 2019-09-06: qty 30

## 2019-09-06 MED ORDER — SODIUM CHLORIDE 0.9 % IV BOLUS
1000.0000 mL | Freq: Once | INTRAVENOUS | Status: AC
  Administered 2019-09-06: 1000 mL via INTRAVENOUS

## 2019-09-06 NOTE — ED Provider Notes (Signed)
[facility] Provider Note   CSN: [id number] Arrival date & time: 09/06/19  1956     History Chief Complaint  Patient presents with  . Urinary Tract Infection    Brittany Russo is a 74 y.o. female.  Pt presents to the ED today with AMS.  The pt does not feel well.  She thinks she has a UTI.  Pt also has a hx of cirrhosis of the liver. The pt denies any f/c.  No sob or cough.  Pt's daughter said she thinks her dad has not been giving her mom all the correct medications.  She said he did decrease the lactulose due to diarrhea.  Pt has not been eating all week.  Daughter said she's been vomiting.        Past Medical History:  Diagnosis Date  . Acute and subacute hepatic failure without coma   . Allergy   . Anasarca   . Anxiety   . Arthritis   . Ascites 09/2018   no SBP on 850 cc tap 10/13/18  . Asthma    allery induced  . Cirrhosis of liver ([place]) ~ 2004   from NAFLD.  Dx ~ 2004.  Dr Dorcas Mcmurray at [facility]/    . Depression   . Diabetes mellitus without complication ([place])    type 2  . Eczema of both hands   . Endometrial cancer ([place]) 1996, 2003   hysterectomy 1996.  recurrence 2003, treated with radiation.    . Generalized edema   . GI hemorrhage   . 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 in [place]  . Hyperlipidemia   . Hypertension   . Muscle weakness   . Seizures ([place])    last seizure June 07, 2014   . Thrombocytopenia ([place])   . Thyroid nodule     Patient Active Problem List   Diagnosis Date Noted  . Radiation proctitis 07/28/2019  . Status post thoracentesis   . Acute on chronic anemia   . Pleural effusion   . Supratherapeutic INR   . Urinary retention   . Radiation cystitis   . Labile blood pressure   . Labile blood glucose   . Anxiety state   . Controlled type 2 diabetes mellitus with hyperglycemia, with long-term current use of insulin ([place])   . Hyponatremia   .  Thrombocytopenia ([place])   . Phantom limb pain ([place])   . Postoperative pain   . Amputation above knee ([place]) 07/04/2019  . S/P right knee disarticulation amputation 06/29/2019  . Above knee amputation of right lower extremity ([place]) 06/29/2019  . Hyperglycemia   . Failed total right knee replacement ([place])   . Hepatic encephalopathy ([place]) 12/28/2018  . UTI (urinary tract infection) 12/28/2018  . Bradycardia 12/28/2018  . Hyperkalemia 12/28/2018  . Overweight (BMI 25.0-29.9) 11/23/2018  . Mechanical problem with extremity 11/22/2018  . Cirrhosis of liver with ascites ([place]) 11/09/2018  . Nausea without vomiting 10/26/2018  . Ascites   . Rectal bleeding   . AKI (acute kidney injury) ([place]) 10/11/2018  . Lower GI bleed 10/10/2018  . Anasarca 10/10/2018  . Hypoalbuminemia 10/10/2018  . Depression 09/28/2018  . Acute hepatic encephalopathy 09/28/2018  . Cirrhosis ([place]) 09/28/2018  . Periprosthetic fracture around internal prosthetic right knee joint 09/24/2018  . S/P right TKA 09/22/2018  . Status post total knee replacement, right 09/22/2018  . DM2 (diabetes mellitus, type 2) ([place]) 07/11/2015  . Hypertension  07/11/2015  . Hyperlipidemia 07/11/2015    Past Surgical History:  Procedure Laterality Date  . AMPUTATION Right 06/29/2019   Procedure: AMPUTATION ABOVE KNEE through knee;  Surgeon: Paralee Cancel, MD;  Location: [facility];  Service: Orthopedics;  Laterality: Right;  2 hrs  . CATARACT EXTRACTION W/PHACO Left 06/04/2017   Procedure: CATARACT EXTRACTION PHACO AND INTRAOCULAR LENS PLACEMENT (IOC);  Surgeon: Baruch Goldmann, MD;  Location: [facility];  Service: Ophthalmology;  Laterality: Left;  CDE: 3.90  . CATARACT EXTRACTION W/PHACO Right 07/15/2017   Procedure: CATARACT EXTRACTION PHACO AND INTRAOCULAR LENS PLACEMENT (IOC);  Surgeon: Baruch Goldmann, MD;  Location: [facility];  Service: Ophthalmology;  Laterality: Right;  CDE: 7.60  . COLONOSCOPY  06/2007   in [place]. for hematochzia.  radiation  proctitis  . DILATION AND CURETTAGE OF UTERUS    . ESOPHAGOGASTRODUODENOSCOPY  2016  . ESOPHAGOGASTRODUODENOSCOPY (EGD) WITH PROPOFOL N/A 12/30/2018   Procedure: EGD;  Surgeon: Clarene Essex, MD;  [facility]: [facility];  Service: Endoscopy;  Laterality: N/A;  . EXCISIONAL TOTAL KNEE ARTHROPLASTY Right 01/03/2019   Procedure: EXCISIONAL TOTAL KNEE ARTHROPLASTY;  Surgeon: Paralee Cancel, MD;  Location: [facility];  Service: Orthopedics;  Laterality: Right;  . FLEXIBLE SIGMOIDOSCOPY N/A 12/30/2018   Procedure: FLEXIBLE SIGMOIDOSCOPY;  Surgeon: Clarene Essex, MD;  [facility];  Service: Endoscopy;  Laterality: N/A;  . IR PARACENTESIS  10/13/2018  . IR PARACENTESIS  07/21/2019  . IR THORACENTESIS ASP PLEURAL SPACE W/IMG GUIDE  07/19/2019  . STERIOD INJECTION Left 06/29/2019   Procedure: LEFT KNEE INJECTION;  Surgeon: Paralee Cancel, MD;  [facility];  Service: Orthopedics;  Laterality: Left;  . TOTAL ABDOMINAL HYSTERECTOMY  1996  . TOTAL KNEE ARTHROPLASTY Right 09/22/2018   Procedure: TOTAL KNEE ARTHROPLASTY;  Surgeon: Paralee Cancel, MD;  [facility];  Service: Orthopedics;  Laterality: Right;  70 mins  . TOTAL KNEE REVISION Right 09/27/2018   Procedure: TOTAL KNEE REVISION;  Surgeon: Paralee Cancel, MD;  [facility];  Service: Orthopedics;  Laterality: Right;  . TOTAL KNEE REVISION Right 11/22/2018   Procedure: repair right knee extensor mechanism;  Surgeon: Paralee Cancel, MD;  Location[facility];  Service: Orthopedics;  Laterality: Right;  90 mins     OB History   No obstetric history on file.     Family History  Problem Relation Age of Onset  . Heart disease Mother   . Diabetes Mother   . Heart disease Father   . Cancer Father   . Cancer Maternal Grandmother   . Heart disease Maternal Grandfather     Social History   Tobacco Use  . Smoking status: Never Smoker  . Smokeless tobacco: Never Used  Vaping Use  . Vaping Use: Never used  Substance Use Topics  . Alcohol use: No    . Drug use: No    Home Medications Prior to Admission medications   Medication Sig Start Date End Date Taking? Authorizing Provider  acetaminophen (TYLENOL) 325 MG tablet Take 1-2 tablets (325-650 mg total) by mouth every 4 (four) hours as needed for mild pain. 07/13/19  Yes Love, Ivan Anchors, PA-C  albuterol (VENTOLIN HFA) 108 (90 Base) MCG/ACT inhaler Inhale 1-2 puffs into the lungs every 6 (six) hours as needed for wheezing or shortness of breath.   Yes [provider]  ALPRAZolam (XANAX) 0.25 MG tablet Take 1 tablet (0.25 mg total) by mouth 2 (two) times daily. 01/06/19  Yes Mariel Aloe, MD  atenolol (TENORMIN) 50 MG tablet Take  50 mg by mouth every morning.    Yes [provider]  atorvastatin (LIPITOR) 80 MG tablet Take 80 mg by mouth daily at 8 pm.   Yes [provider]  buPROPion (WELLBUTRIN XL) 150 MG 24 hr tablet Take 150 mg by mouth in the morning.    Yes [provider]  escitalopram (LEXAPRO) 20 MG tablet Take 20 mg by mouth in the morning.  05/17/19  Yes [provider]  ferrous sulfate (FERROUSUL) 325 (65 FE) MG tablet Take 1 tablet (325 mg total) by mouth 3 (three) times daily with meals. Patient taking differently: Take 325 mg by mouth 2 (two) times daily with a meal.  07/27/19 09/06/19 Yes Love, Ivan Anchors, PA-C  fluticasone (FLONASE) 50 MCG/ACT nasal spray Place 1 spray into both nostrils in the morning.    Yes [provider]  furosemide (LASIX) 20 MG tablet Take 1 tablet (20 mg total) by mouth 2 (two) times daily. 07/27/19  Yes Love, Ivan Anchors, PA-C  gabapentin (NEURONTIN) 300 MG capsule Take 1 capsule (300 mg total) by mouth 2 (two) times daily. Patient taking differently: Take 300 mg by mouth 2 (two) times daily as needed (for nerve pain).  07/27/19  Yes Love, Ivan Anchors, PA-C  glipiZIDE (GLUCOTROL) 5 MG tablet Take 5 mg by mouth 2 (two) times daily before a meal.   Yes [provider]  Insulin Glargine (BASAGLAR KWIKPEN)  100 UNIT/ML Inject 10 Units into the skin See admin instructions. If blood sugars are 100 or more, give 10 units in the morning. *checking blood sugar levels 3 times throughout the day to maintain levels. Only giving 2nd dose if needed for high blood sugar levels (10-20 units)   Yes [provider]  lactulose (CHRONULAC) 10 GM/15ML solution Take 22.5 mLs (15 g total) by mouth 2 (two) times daily. Patient taking differently: Take 10-20 g by mouth daily.  07/27/19  Yes Love, Ivan Anchors, PA-C  lip balm (CARMEX) ointment Apply topically as needed for lip care. 07/13/19  Yes Love, Ivan Anchors, PA-C  loratadine (CLARITIN) 10 MG tablet Take 10 mg by mouth in the morning.    Yes [provider]  methocarbamol (ROBAXIN) 500 MG tablet Take 1 tablet (500 mg total) by mouth every 6 (six) hours as needed for muscle spasms. 07/27/19  Yes Love, Ivan Anchors, PA-C  Multiple Vitamin (MULTIVITAMIN WITH MINERALS) TABS tablet Take 1 tablet by mouth daily. 12/06/18  Yes Babish, Rodman Key, PA-C  potassium chloride (KLOR-CON) 10 MEQ tablet Take 1 tablet (10 mEq total) by mouth daily. Patient taking differently: Take 10 mEq by mouth 2 (two) times daily.  07/27/19  Yes Love, Ivan Anchors, PA-C  rifaximin (XIFAXAN) 550 MG TABS tablet Take 550 mg by mouth 2 (two) times daily.   Yes [provider]  sitaGLIPtin-metformin (JANUMET) 50-500 MG per tablet Take 1 tablet by mouth 2 (two) times daily with a meal.   Yes [provider]  spironolactone (ALDACTONE) 100 MG tablet Take 1 tablet (100 mg total) by mouth daily. Patient taking differently: Take 100 mg by mouth in the morning.  07/28/19  Yes Love, Ivan Anchors, PA-C  traMADol (ULTRAM) 50 MG tablet Take 1 tablet (50 mg total) by mouth every 6 (six) hours as needed for moderate pain. 07/27/19  Yes Love, Ivan Anchors, PA-C  traZODone (DESYREL) 50 MG tablet Take 50 mg by mouth at bedtime as needed for sleep.  05/17/19  Yes [provider]  doxycycline (VIBRAMYCIN)  100 MG  capsule Take 1 capsule (100 mg total) by mouth 2 (two) times daily. Patient not taking: Reported on 09/06/2019 07/30/19   Petrucelli, Glynda Jaeger, PA-C  HYDROcodone-acetaminophen (NORCO) 7.5-325 MG tablet Take 1 tablet by mouth every 12 (twelve) hours as needed for severe pain. Patient not taking: Reported on 09/06/2019 07/27/19   Love, Ivan Anchors, PA-C  mesalamine (CANASA) 1000 MG suppository Place 1 suppository (1,000 mg total) rectally at bedtime. Patient not taking: Reported on 09/06/2019 07/27/19   Love, Ivan Anchors, PA-C  polyethylene glycol (MIRALAX / GLYCOLAX) 17 g packet Take 17 g by mouth daily as needed. Patient not taking: Reported on 09/06/2019 07/27/19   Love, Ivan Anchors, PA-C  Ibuprofen-Diphenhydramine Cit (ADVIL PM PO) Take 60 mg by mouth at bedtime.  05/21/17  [provider]    Allergies    Asa [aspirin] and Other  Review of Systems   Review of Systems  Constitutional: Positive for fatigue.  Psychiatric/Behavioral: Positive for confusion.  All other systems reviewed and are negative.   Physical Exam Updated Vital Signs BP (!) 130/52   Pulse (!) 58   Temp (P) 97.6 F (36.4 C) (Oral)   Resp 12   Ht 5' 2"  (1.575 m)   Wt 65.8 kg   SpO2 98%   BMI 26.52 kg/m   Physical Exam Vitals and nursing note reviewed.  Constitutional:      Appearance: Normal appearance.  HENT:     Head: Normocephalic and atraumatic.     Right Ear: External ear normal.     Left Ear: External ear normal.     Nose: Nose normal.     Mouth/Throat:     Mouth: Mucous membranes are moist.     Pharynx: Oropharynx is clear.  Eyes:     Extraocular Movements: Extraocular movements intact.     Conjunctiva/sclera: Conjunctivae normal.     Pupils: Pupils are equal, round, and reactive to light.  Cardiovascular:     Rate and Rhythm: Normal rate and regular rhythm.     Pulses: Normal pulses.     Heart sounds: Normal heart sounds.  Pulmonary:     Effort: Pulmonary effort is normal.     Breath sounds: Normal  breath sounds.  Abdominal:     General: Abdomen is flat. Bowel sounds are normal.     Palpations: Abdomen is soft.  Musculoskeletal:     Cervical back: Normal range of motion and neck supple.     Comments: Right bka  Skin:    General: Skin is warm.     Capillary Refill: Capillary refill takes less than 2 seconds.  Neurological:     Mental Status: She is alert. She is disoriented.  Psychiatric:        Mood and Affect: Mood normal.        Behavior: Behavior normal.        Thought Content: Thought content normal.        Judgment: Judgment normal.     ED Results / Procedures / Treatments   Labs (all labs ordered are listed, but only abnormal results are displayed) Labs Reviewed  COMPREHENSIVE METABOLIC PANEL - Abnormal; Notable for the following components:      Result Value   Sodium 132 (*)    Chloride 97 (*)    Glucose, Bld 122 (*)    Calcium 8.6 (*)    Total Protein 5.8 (*)    Albumin 2.6 (*)    AST 85 (*)  ALT 68 (*)    Total Bilirubin 1.3 (*)    All other components within normal limits  CBC WITH DIFFERENTIAL/PLATELET - Abnormal; Notable for the following components:   RBC 3.77 (*)    Hemoglobin 10.4 (*)    HCT 34.2 (*)    RDW 18.5 (*)    Platelets 72 (*)    All other components within normal limits  LACTIC ACID, PLASMA - Abnormal; Notable for the following components:   Lactic Acid, Venous 3.6 (*)    All other components within normal limits  LACTIC ACID, PLASMA - Abnormal; Notable for the following components:   Lactic Acid, Venous 3.0 (*)    All other components within normal limits  AMMONIA - Abnormal; Notable for the following components:   Ammonia 80 (*)    All other components within normal limits  CULTURE, BLOOD (ROUTINE X 2)  CULTURE, BLOOD (ROUTINE X 2)  URINE CULTURE  SARS CORONAVIRUS 2 BY RT PCR (HOSPITAL ORDER, [facility])  URINALYSIS, ROUTINE W REFLEX MICROSCOPIC    EKG EKG Interpretation  Date/Time:  Wednesday  September 06 2019 20:51:18 EDT Ventricular Rate:  58 PR Interval:    QRS Duration: 94 QT Interval:  528 QTC Calculation: 519 R Axis:   -25 Text Interpretation: Sinus rhythm Left ventricular hypertrophy Nonspecific T abnormalities, inferior leads Prolonged QT interval No significant change since last tracing Confirmed by Isla Pence [phone]) on 09/06/2019 10:01:53 PM   Radiology DG Chest Portable 1 View  Result Date: 09/06/2019 CLINICAL DATA:  Altered mental status EXAM: PORTABLE CHEST 1 VIEW COMPARISON:  07/30/2019 FINDINGS: Cardiomegaly. No focal opacity or pleural effusion. Aortic atherosclerosis. No pneumothorax. IMPRESSION: No active disease.  Cardiomegaly. Electronically Signed   By: Donavan Foil M.D.   On: 09/06/2019 22:06    Procedures Procedures (including critical care time)  Medications Ordered in ED Medications  lactulose (CHRONULAC) 10 GM/15ML solution 20 g (has no administration in time range)  sodium chloride 0.9 % bolus 1,000 mL (1,000 mLs Intravenous New Bag/Given 09/06/19 2111)  sodium chloride 0.9 % bolus 1,000 mL (1,000 mLs Intravenous New Bag/Given 09/06/19 2236)    ED Course  I have reviewed the triage vital signs and the nursing notes.  Pertinent labs & imaging results that were available during my care of the patient were reviewed by me and considered in my medical decision making (see chart for details).    MDM Rules/Calculators/A&P                         AMS is likely due to elevated ammonia.  Lactic acid elevation is likely due to dehydration.  Pt given IVFs.  No f/c.  No signs of infection.  covid negative.    Pt d/w Dr. Humphrey Rolls for admission. Final Clinical Impression(s) / ED Diagnoses Final diagnoses:  Hepatic encephalopathy ([place])  Lactic acidosis    Rx / DC Orders ED Discharge Orders    None       Isla Pence, MD 09/06/19 2357

## 2019-09-06 NOTE — ED Triage Notes (Signed)
Pt to er room number 10 via ems, per ems pt is here for a uti, states that the family thought that she had a uti and waited two days so that she could come in and get iv abx because they work faster, states that they would also like to have pt's ammonia checked, they had reduced her medicine for ammonia at home because it was giving her diarrhea.  Pt states that she is here because she has some abd pain

## 2019-09-06 NOTE — ED Notes (Signed)
Date and time results received: 09/06/19 2158 (use smartphrase ".now" to insert current time)  Test: Lactate Critical Value: 3.6  Name of Provider Notified: Gilford Raid

## 2019-09-06 NOTE — ED Notes (Signed)
Pt had one small formed by, cleaned pt, changed depends.  purewic in place

## 2019-09-07 ENCOUNTER — Observation Stay (HOSPITAL_COMMUNITY): Payer: Medicare Other

## 2019-09-07 DIAGNOSIS — E7220 Disorder of urea cycle metabolism, unspecified: Secondary | ICD-10-CM | POA: Diagnosis not present

## 2019-09-07 DIAGNOSIS — R791 Abnormal coagulation profile: Secondary | ICD-10-CM | POA: Diagnosis present

## 2019-09-07 DIAGNOSIS — R188 Other ascites: Secondary | ICD-10-CM | POA: Diagnosis present

## 2019-09-07 DIAGNOSIS — E1165 Type 2 diabetes mellitus with hyperglycemia: Secondary | ICD-10-CM | POA: Diagnosis present

## 2019-09-07 DIAGNOSIS — F419 Anxiety disorder, unspecified: Secondary | ICD-10-CM | POA: Diagnosis present

## 2019-09-07 DIAGNOSIS — E785 Hyperlipidemia, unspecified: Secondary | ICD-10-CM | POA: Diagnosis present

## 2019-09-07 DIAGNOSIS — R4189 Other symptoms and signs involving cognitive functions and awareness: Secondary | ICD-10-CM | POA: Diagnosis present

## 2019-09-07 DIAGNOSIS — J302 Other seasonal allergic rhinitis: Secondary | ICD-10-CM | POA: Diagnosis present

## 2019-09-07 DIAGNOSIS — K7200 Acute and subacute hepatic failure without coma: Secondary | ICD-10-CM | POA: Diagnosis present

## 2019-09-07 DIAGNOSIS — Z8249 Family history of ischemic heart disease and other diseases of the circulatory system: Secondary | ICD-10-CM | POA: Diagnosis not present

## 2019-09-07 DIAGNOSIS — K7290 Hepatic failure, unspecified without coma: Secondary | ICD-10-CM | POA: Diagnosis present

## 2019-09-07 DIAGNOSIS — Z20822 Contact with and (suspected) exposure to covid-19: Secondary | ICD-10-CM | POA: Diagnosis present

## 2019-09-07 DIAGNOSIS — F329 Major depressive disorder, single episode, unspecified: Secondary | ICD-10-CM | POA: Diagnosis present

## 2019-09-07 DIAGNOSIS — E872 Acidosis, unspecified: Secondary | ICD-10-CM

## 2019-09-07 DIAGNOSIS — R0902 Hypoxemia: Secondary | ICD-10-CM | POA: Diagnosis not present

## 2019-09-07 DIAGNOSIS — Z79899 Other long term (current) drug therapy: Secondary | ICD-10-CM | POA: Diagnosis not present

## 2019-09-07 DIAGNOSIS — K760 Fatty (change of) liver, not elsewhere classified: Secondary | ICD-10-CM | POA: Diagnosis present

## 2019-09-07 DIAGNOSIS — R109 Unspecified abdominal pain: Secondary | ICD-10-CM | POA: Diagnosis present

## 2019-09-07 DIAGNOSIS — E11649 Type 2 diabetes mellitus with hypoglycemia without coma: Secondary | ICD-10-CM | POA: Diagnosis not present

## 2019-09-07 DIAGNOSIS — G9341 Metabolic encephalopathy: Secondary | ICD-10-CM | POA: Diagnosis present

## 2019-09-07 DIAGNOSIS — Z923 Personal history of irradiation: Secondary | ICD-10-CM | POA: Diagnosis not present

## 2019-09-07 DIAGNOSIS — Z7401 Bed confinement status: Secondary | ICD-10-CM | POA: Diagnosis not present

## 2019-09-07 DIAGNOSIS — Z833 Family history of diabetes mellitus: Secondary | ICD-10-CM | POA: Diagnosis not present

## 2019-09-07 DIAGNOSIS — K7460 Unspecified cirrhosis of liver: Secondary | ICD-10-CM

## 2019-09-07 DIAGNOSIS — Z794 Long term (current) use of insulin: Secondary | ICD-10-CM | POA: Diagnosis not present

## 2019-09-07 DIAGNOSIS — I119 Hypertensive heart disease without heart failure: Secondary | ICD-10-CM | POA: Diagnosis present

## 2019-09-07 DIAGNOSIS — Z90710 Acquired absence of both cervix and uterus: Secondary | ICD-10-CM | POA: Diagnosis not present

## 2019-09-07 DIAGNOSIS — Z89611 Acquired absence of right leg above knee: Secondary | ICD-10-CM | POA: Diagnosis not present

## 2019-09-07 DIAGNOSIS — R7401 Elevation of levels of liver transaminase levels: Secondary | ICD-10-CM | POA: Diagnosis present

## 2019-09-07 DIAGNOSIS — E869 Volume depletion, unspecified: Secondary | ICD-10-CM | POA: Diagnosis present

## 2019-09-07 LAB — TSH: TSH: 3.314 u[IU]/mL (ref 0.350–4.500)

## 2019-09-07 LAB — COMPREHENSIVE METABOLIC PANEL
ALT: 60 U/L — ABNORMAL HIGH (ref 0–44)
AST: 74 U/L — ABNORMAL HIGH (ref 15–41)
Albumin: 2.3 g/dL — ABNORMAL LOW (ref 3.5–5.0)
Alkaline Phosphatase: 93 U/L (ref 38–126)
Anion gap: 7 (ref 5–15)
BUN: 11 mg/dL (ref 8–23)
CO2: 24 mmol/L (ref 22–32)
Calcium: 8.4 mg/dL — ABNORMAL LOW (ref 8.9–10.3)
Chloride: 106 mmol/L (ref 98–111)
Creatinine, Ser: 0.66 mg/dL (ref 0.44–1.00)
GFR calc Af Amer: >60 mL/min (ref >60)
GFR calc non Af Amer: >60 mL/min (ref >60)
Glucose, Bld: 53 mg/dL — ABNORMAL LOW (ref 70–99)
Potassium: 3.9 mmol/L (ref 3.5–5.1)
Sodium: 137 mmol/L (ref 135–145)
Total Bilirubin: 1.1 mg/dL (ref 0.3–1.2)
Total Protein: 5.2 g/dL — ABNORMAL LOW (ref 6.5–8.1)

## 2019-09-07 LAB — CBC
HCT: 31.4 % — ABNORMAL LOW (ref 36.0–46.0)
Hemoglobin: 9.5 g/dL — ABNORMAL LOW (ref 12.0–15.0)
MCH: 27.9 pg (ref 26.0–34.0)
MCHC: 30.3 g/dL (ref 30.0–36.0)
MCV: 92.1 fL (ref 80.0–100.0)
Platelets: 61 10*3/uL — ABNORMAL LOW (ref 150–400)
RBC: 3.41 MIL/uL — ABNORMAL LOW (ref 3.87–5.11)
RDW: 18.5 % — ABNORMAL HIGH (ref 11.5–15.5)
WBC: 3.4 10*3/uL — ABNORMAL LOW (ref 4.0–10.5)
nRBC: 0 % (ref 0.0–0.2)

## 2019-09-07 LAB — FOLATE: Folate: 8.9 ng/mL (ref >5.9)

## 2019-09-07 LAB — GLUCOSE, CAPILLARY
Glucose-Capillary: 58 mg/dL — ABNORMAL LOW (ref 70–99)
Glucose-Capillary: 167 mg/dL — ABNORMAL HIGH (ref 70–99)
Glucose-Capillary: 134 mg/dL — ABNORMAL HIGH (ref 70–99)
Glucose-Capillary: 102 mg/dL — ABNORMAL HIGH (ref 70–99)

## 2019-09-07 LAB — T4, FREE: Free T4: 1.1 ng/dL (ref 0.61–1.12)

## 2019-09-07 LAB — AMMONIA: Ammonia: 52 umol/L — ABNORMAL HIGH (ref 9–35)

## 2019-09-07 LAB — VITAMIN B12: Vitamin B-12: 242 pg/mL (ref 180–914)

## 2019-09-07 MED ORDER — LORATADINE 10 MG PO TABS
10.0000 mg | ORAL_TABLET | Freq: Every morning | ORAL | Status: DC
  Administered 2019-09-07 – 2019-09-08 (×2): 10 mg via ORAL
  Filled 2019-09-07 (×2): qty 1

## 2019-09-07 MED ORDER — INSULIN ASPART 100 UNIT/ML ~~LOC~~ SOLN: 0.0000 [IU] | Freq: Every day | SUBCUTANEOUS | Status: DC

## 2019-09-07 MED ORDER — ONDANSETRON HCL 4 MG/2ML IJ SOLN
4.0000 mg | Freq: Four times a day (QID) | INTRAMUSCULAR | Status: DC | PRN
  Administered 2019-09-07 (×3): 4 mg via INTRAVENOUS
  Filled 2019-09-07 (×3): qty 2

## 2019-09-07 MED ORDER — RIFAXIMIN 550 MG PO TABS
550.0000 mg | ORAL_TABLET | Freq: Two times a day (BID) | ORAL | Status: DC
  Administered 2019-09-07 – 2019-09-08 (×3): 550 mg via ORAL
  Filled 2019-09-07 (×3): qty 1

## 2019-09-07 MED ORDER — ONDANSETRON HCL 4 MG PO TABS: 4.0000 mg | ORAL_TABLET | Freq: Four times a day (QID) | ORAL | Status: DC | PRN

## 2019-09-07 MED ORDER — LACTATED RINGERS IV SOLN: INTRAVENOUS | Status: AC

## 2019-09-07 MED ORDER — PANTOPRAZOLE SODIUM 40 MG IV SOLR
40.0000 mg | Freq: Two times a day (BID) | INTRAVENOUS | Status: DC
  Administered 2019-09-07 – 2019-09-08 (×3): 40 mg via INTRAVENOUS
  Filled 2019-09-07 (×3): qty 40

## 2019-09-07 MED ORDER — ATORVASTATIN CALCIUM 40 MG PO TABS
80.0000 mg | ORAL_TABLET | Freq: Every day | ORAL | Status: DC
  Administered 2019-09-07 (×2): 80 mg via ORAL
  Filled 2019-09-07 (×2): qty 2

## 2019-09-07 MED ORDER — PIPERACILLIN-TAZOBACTAM 3.375 G IVPB
3.3750 g | Freq: Three times a day (TID) | INTRAVENOUS | Status: DC
  Administered 2019-09-07 – 2019-09-08 (×5): 3.375 g via INTRAVENOUS
  Filled 2019-09-07 (×5): qty 50

## 2019-09-07 MED ORDER — INSULIN ASPART 100 UNIT/ML ~~LOC~~ SOLN
0.0000 [IU] | Freq: Three times a day (TID) | SUBCUTANEOUS | Status: DC
  Administered 2019-09-08: 2 [IU] via SUBCUTANEOUS

## 2019-09-07 MED ORDER — DEXTROSE 50 % IV SOLN
INTRAVENOUS | Status: AC
  Administered 2019-09-07: 50 mL
  Filled 2019-09-07: qty 50

## 2019-09-07 MED ORDER — LACTULOSE 10 GM/15ML PO SOLN
30.0000 g | Freq: Two times a day (BID) | ORAL | Status: DC
  Administered 2019-09-07 – 2019-09-08 (×3): 30 g via ORAL
  Filled 2019-09-07 (×3): qty 60

## 2019-09-07 NOTE — H&P (Signed)
History and Physical    Brittany Russo TDV:[id number] DOB: 1945/06/18 DOA: 09/06/2019  PCP: Rosalee Kaufman, PA-C (Confirm with patient/family/NH records and if not entered, this has to be entered at [facility] point of entry) Patient coming from: Home  I have personally briefly reviewed patient's old medical records in [place]  Chief Complaint: Altered mental status  HPI: Brittany Russo is a 74 y.o. female with medical history significant of liver cirrhosis, hypertension, hyperlipidemia and diabetes mellitus who was brought to ED by EMS for evaluation of altered mental status.  During my evaluation patient's daughter is also present at the bedside who also helps with HPI questions.  According to the patient's daughter patient is very weak, fatigued and confused for the last 2 days and the condition continue to worsen.  Patient's daughter  reported that similar condition happened in the past when the patient had UTI.  Daughter further reported that patient was having some nausea and vomiting with loose stools therefore she was not giving her regular dose of lactulose.  During my evaluation patient is awake, alert but very somnolent and looks tired.  Patient is oriented to place and person but not to time.  Patient is complaining of some abdominal pain and nausea but denies fever, chills, chest pain, shortness of breath, vomiting, constipation, dysuria and urinary urgency.    ED Course: On arrival to the ED, patient had temperature of 97.6, blood pressure 130/52, heart rate 58, respiratory rate 12 and oxygen saturation 98% on room air.  Blood work showed WBC 4.2, hemoglobin 10.4, 132, potassium 4.1, BUN 13, creatinine 0.7, blood glucose 122, INR 1.3, ammonia level 80, transaminitis and lactic acid greater than 3.  Patient was given bolus of IV normal saline because of lactic acidosis and a dose of lactulose in the ED.  Review of Systems: As per HPI otherwise 10 point review of systems negative.   Unacceptable ROS statements: "10 systems reviewed," "Extensive" (without elaboration).  Acceptable ROS statements: "All others negative," "All others reviewed and are negative," and "All others unremarkable," with at [place] documented Can't double dip - if using for HPI can't use for ROS  Past Medical History:  Diagnosis Date  . Acute and subacute hepatic failure without coma   . Allergy   . Anasarca   . Anxiety   . Arthritis   . Ascites 09/2018   no SBP on 850 cc tap 10/13/18  . Asthma    allery induced  . Cirrhosis of liver ([place]) ~ 2004   from NAFLD.  Dx ~ 2004.  Dr Dorcas Mcmurray at [facility]/    . Depression   . Diabetes mellitus without complication ([place])    type 2  . Eczema of both hands   . Endometrial cancer ([place]) 1996, 2003   hysterectomy 1996.  recurrence 2003, treated with radiation.    . Generalized edema   . GI hemorrhage   . 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 in [place]  . Hyperlipidemia   . Hypertension   . Muscle weakness   . Seizures ([place])    last seizure June 07, 2014   . Thrombocytopenia ([place])   . Thyroid nodule     Past Surgical History:  Procedure Laterality Date  . AMPUTATION Right 06/29/2019   Procedure: AMPUTATION ABOVE KNEE through knee;  Surgeon: Paralee Cancel, MD;  Location: [facility];  Service: Orthopedics;  Laterality: Right;  2 hrs  .  CATARACT EXTRACTION W/PHACO Left 06/04/2017   Procedure: CATARACT EXTRACTION PHACO AND INTRAOCULAR LENS PLACEMENT (IOC);  Surgeon: Baruch Goldmann, MD;  Location: [facility];  Service: Ophthalmology;  Laterality: Left;  CDE: 3.90  . CATARACT EXTRACTION W/PHACO Right 07/15/2017   Procedure: CATARACT EXTRACTION PHACO AND INTRAOCULAR LENS PLACEMENT (IOC);  Surgeon: Baruch Goldmann, MD;  Location: [facility];  Service: Ophthalmology;  Laterality: Right;  CDE: 7.60  . COLONOSCOPY  06/2007   in [place] for hematochzia.  radiation proctitis  . DILATION AND CURETTAGE OF  UTERUS    . ESOPHAGOGASTRODUODENOSCOPY  2016  . ESOPHAGOGASTRODUODENOSCOPY (EGD) WITH PROPOFOL N/A 12/30/2018   Procedure: EGD;  Surgeon: Clarene Essex, MD;  [facility];  Service: Endoscopy;  Laterality: N/A;  . EXCISIONAL TOTAL KNEE ARTHROPLASTY Right 01/03/2019   Procedure: EXCISIONAL TOTAL KNEE ARTHROPLASTY;  Surgeon: Paralee Cancel, MD;  [facility];  Service: Orthopedics;  Laterality: Right;  . FLEXIBLE SIGMOIDOSCOPY N/A 12/30/2018   Procedure: FLEXIBLE SIGMOIDOSCOPY;  Surgeon: Clarene Essex, MD;  [facility];  Service: Endoscopy;  Laterality: N/A;  . IR PARACENTESIS  10/13/2018  . IR PARACENTESIS  07/21/2019  . IR THORACENTESIS ASP PLEURAL SPACE W/IMG GUIDE  07/19/2019  . STERIOD INJECTION Left 06/29/2019   Procedure: LEFT KNEE INJECTION;  Surgeon: Paralee Cancel, MD;  [facility];  Service: Orthopedics;  Laterality: Left;  . TOTAL ABDOMINAL HYSTERECTOMY  1996  . TOTAL KNEE ARTHROPLASTY Right 09/22/2018   Procedure: TOTAL KNEE ARTHROPLASTY;  Surgeon: Paralee Cancel, MD;  [facility];  Service: Orthopedics;  Laterality: Right;  70 mins  . TOTAL KNEE REVISION Right 09/27/2018   Procedure: TOTAL KNEE REVISION;  Surgeon: Paralee Cancel, MD;  [facility];  Service: Orthopedics;  Laterality: Right;  . TOTAL KNEE REVISION Right 11/22/2018   Procedure: repair right knee extensor mechanism;  Surgeon: Paralee Cancel, MD;  Location[facility];  Service: Orthopedics;  Laterality: Right;  90 mins     reports that she has never smoked. She has never used smokeless tobacco. She reports that she does not drink alcohol and does not use drugs.  Allergies  Allergen Reactions  . Asa [Aspirin] Other (See Comments)    Pt not allergic but cannot take due to bleeding   . Other Other (See Comments)    Any jewelry metal-hives and itching     Family History  Problem Relation Age of Onset  . Heart disease Mother   . Diabetes Mother   . Heart disease Father   . Cancer Father   .  Cancer Maternal Grandmother   . Heart disease Maternal Grandfather     Unacceptable: Noncontributory, unremarkable, or negative. Acceptable: (example)Family history negative for heart disease  Prior to Admission medications   Medication Sig Start Date End Date Taking? Authorizing Provider  acetaminophen (TYLENOL) 325 MG tablet Take 1-2 tablets (325-650 mg total) by mouth every 4 (four) hours as needed for mild pain. 07/13/19  Yes Love, Ivan Anchors, PA-C  albuterol (VENTOLIN HFA) 108 (90 Base) MCG/ACT inhaler Inhale 1-2 puffs into the lungs every 6 (six) hours as needed for wheezing or shortness of breath.   Yes [provider]  ALPRAZolam (XANAX) 0.25 MG tablet Take 1 tablet (0.25 mg total) by mouth 2 (two) times daily. 01/06/19  Yes Mariel Aloe, MD  atenolol (TENORMIN) 50 MG tablet Take 50 mg by mouth every morning.    Yes [provider]  atorvastatin (LIPITOR) 80 MG tablet Take 80 mg by mouth daily at 8  pm.   Yes [provider]  buPROPion (WELLBUTRIN XL) 150 MG 24 hr tablet Take 150 mg by mouth in the morning.    Yes [provider]  escitalopram (LEXAPRO) 20 MG tablet Take 20 mg by mouth in the morning.  05/17/19  Yes [provider]  ferrous sulfate (FERROUSUL) 325 (65 FE) MG tablet Take 1 tablet (325 mg total) by mouth 3 (three) times daily with meals. Patient taking differently: Take 325 mg by mouth 2 (two) times daily with a meal.  07/27/19 09/06/19 Yes Love, Ivan Anchors, PA-C  fluticasone (FLONASE) 50 MCG/ACT nasal spray Place 1 spray into both nostrils in the morning.    Yes [provider]  furosemide (LASIX) 20 MG tablet Take 1 tablet (20 mg total) by mouth 2 (two) times daily. 07/27/19  Yes Love, Ivan Anchors, PA-C  gabapentin (NEURONTIN) 300 MG capsule Take 1 capsule (300 mg total) by mouth 2 (two) times daily. Patient taking differently: Take 300 mg by mouth 2 (two) times daily as needed (for nerve pain).  07/27/19  Yes Love, Ivan Anchors, PA-C    glipiZIDE (GLUCOTROL) 5 MG tablet Take 5 mg by mouth 2 (two) times daily before a meal.   Yes [provider]  Insulin Glargine (BASAGLAR KWIKPEN) 100 UNIT/ML Inject 10 Units into the skin See admin instructions. If blood sugars are 100 or more, give 10 units in the morning. *checking blood sugar levels 3 times throughout the day to maintain levels. Only giving 2nd dose if needed for high blood sugar levels (10-20 units)   Yes [provider]  lactulose (CHRONULAC) 10 GM/15ML solution Take 22.5 mLs (15 g total) by mouth 2 (two) times daily. Patient taking differently: Take 10-20 g by mouth daily.  07/27/19  Yes Love, Ivan Anchors, PA-C  lip balm (CARMEX) ointment Apply topically as needed for lip care. 07/13/19  Yes Love, Ivan Anchors, PA-C  loratadine (CLARITIN) 10 MG tablet Take 10 mg by mouth in the morning.    Yes [provider]  methocarbamol (ROBAXIN) 500 MG tablet Take 1 tablet (500 mg total) by mouth every 6 (six) hours as needed for muscle spasms. 07/27/19  Yes Love, Ivan Anchors, PA-C  Multiple Vitamin (MULTIVITAMIN WITH MINERALS) TABS tablet Take 1 tablet by mouth daily. 12/06/18  Yes Babish, Rodman Key, PA-C  potassium chloride (KLOR-CON) 10 MEQ tablet Take 1 tablet (10 mEq total) by mouth daily. Patient taking differently: Take 10 mEq by mouth 2 (two) times daily.  07/27/19  Yes Love, Ivan Anchors, PA-C  rifaximin (XIFAXAN) 550 MG TABS tablet Take 550 mg by mouth 2 (two) times daily.   Yes [provider]  sitaGLIPtin-metformin (JANUMET) 50-500 MG per tablet Take 1 tablet by mouth 2 (two) times daily with a meal.   Yes [provider]  spironolactone (ALDACTONE) 100 MG tablet Take 1 tablet (100 mg total) by mouth daily. Patient taking differently: Take 100 mg by mouth in the morning.  07/28/19  Yes Love, Ivan Anchors, PA-C  traMADol (ULTRAM) 50 MG tablet Take 1 tablet (50 mg total) by mouth every 6 (six) hours as needed for moderate pain. 07/27/19  Yes Love, Ivan Anchors, PA-C   traZODone (DESYREL) 50 MG tablet Take 50 mg by mouth at bedtime as needed for sleep.  05/17/19  Yes [provider]  doxycycline (VIBRAMYCIN) 100 MG capsule Take 1 capsule (100 mg total) by mouth 2 (two) times daily. Patient not taking: Reported on 09/06/2019 07/30/19   Petrucelli,  Glynda Jaeger, PA-C  HYDROcodone-acetaminophen (NORCO) 7.5-325 MG tablet Take 1 tablet by mouth every 12 (twelve) hours as needed for severe pain. Patient not taking: Reported on 09/06/2019 07/27/19   Love, Ivan Anchors, PA-C  mesalamine (CANASA) 1000 MG suppository Place 1 suppository (1,000 mg total) rectally at bedtime. Patient not taking: Reported on 09/06/2019 07/27/19   Love, Ivan Anchors, PA-C  polyethylene glycol (MIRALAX / GLYCOLAX) 17 g packet Take 17 g by mouth daily as needed. Patient not taking: Reported on 09/06/2019 07/27/19   Love, Ivan Anchors, PA-C  Ibuprofen-Diphenhydramine Cit (ADVIL PM PO) Take 60 mg by mouth at bedtime.  05/21/17  [provider]    Physical Exam: Vitals:   09/06/19 2132 09/06/19 2200 09/06/19 2230 09/07/19 0341  BP: (!) 129/50 (!) 129/57 (!) 130/52 (!) 151/62  Pulse: (!) 59 (!) 57 (!) 58 64  Resp: 17 15 12 16   Temp:    98.6 F (37 C)  TempSrc:    Oral  SpO2: 100% 98% 98% 99%  Weight:      Height:        Constitutional: NAD, calm, comfortable Vitals:   09/06/19 2132 09/06/19 2200 09/06/19 2230 09/07/19 0341  BP: (!) 129/50 (!) 129/57 (!) 130/52 (!) 151/62  Pulse: (!) 59 (!) 57 (!) 58 64  Resp: 17 15 12 16   Temp:    98.6 F (37 C)  TempSrc:    Oral  SpO2: 100% 98% 98% 99%  Weight:      Height:        General: Patient is a 74 year old Caucasian female who looks somnolent, tired and fatigued Eyes: PERRL, lids and conjunctivae normal ENMT: Mucous membranes are moist. Posterior pharynx clear of any exudate or lesions.Normal dentition.  Neck: normal, supple, no masses, no thyromegaly Respiratory: clear to auscultation bilaterally, no wheezing, no crackles. Normal  respiratory effort. No accessory muscle use.  Cardiovascular: Regular rate and rhythm, no murmurs / rubs / gallops. No extremity edema. 2+ pedal pulses. No carotid bruits.  Abdomen: no tenderness, no masses palpated. No hepatosplenomegaly. Bowel sounds positive.  Musculoskeletal: no clubbing / cyanosis.  Right BKA and stump covered with clean dressing.Good ROM, no contractures. Normal muscle tone.  Skin: no rashes, lesions, ulcers. No induration Neurologic: CN 2-12 grossly intact. Sensation intact, DTR normal. Strength 5/5 in all 4.  Psychiatric: Patient is awake, alert and oriented to place and person but not to time.  Patient is somnolent and does not look anxious or depressed.    (Anything < 9 systems with 2 bullets each down codes to level 1) (If patient refuses exam can't bill higher level) (Make sure to document decubitus ulcers present on admission -- if possible -- and whether patient has chronic indwelling catheter at time of admission)  Labs on Admission: I have personally reviewed following labs and imaging studies  CBC: Recent Labs  Lab 09/06/19 2043  WBC 4.2  NEUTROABS 2.7  HGB 10.4*  HCT 34.2*  MCV 90.7  PLT 72*   Basic Metabolic Panel: Recent Labs  Lab 09/06/19 2043  NA 132*  K 4.1  CL 97*  CO2 23  GLUCOSE 122*  BUN 13  CREATININE 0.73  CALCIUM 8.6*   GFR: Estimated Creatinine Clearance: 57.4 mL/min (by C-G formula based on SCr of 0.73 mg/dL). Liver Function Tests: Recent Labs  Lab 09/06/19 2043  AST 85*  ALT 68*  ALKPHOS 97  BILITOT 1.3*  PROT 5.8*  ALBUMIN 2.6*   No results for input(s): LIPASE,  AMYLASE in the last 168 hours. Recent Labs  Lab 09/06/19 2043  AMMONIA 80*   Coagulation Profile: No results for input(s): INR, PROTIME in the last 168 hours. Cardiac Enzymes: No results for input(s): CKTOTAL, CKMB, CKMBINDEX, TROPONINI in the last 168 hours. BNP (last 3 results) No results for input(s): PROBNP in the last 8760 hours. HbA1C: No  results for input(s): HGBA1C in the last 72 hours. CBG: No results for input(s): GLUCAP in the last 168 hours. Lipid Profile: No results for input(s): CHOL, HDL, LDLCALC, TRIG, CHOLHDL, LDLDIRECT in the last 72 hours. Thyroid Function Tests: No results for input(s): TSH, T4TOTAL, FREET4, T3FREE, THYROIDAB in the last 72 hours. Anemia Panel: No results for input(s): VITAMINB12, FOLATE, FERRITIN, TIBC, IRON, RETICCTPCT in the last 72 hours. Urine analysis:    Component Value Date/Time   COLORURINE YELLOW 09/06/2019 2228   APPEARANCEUR CLEAR 09/06/2019 2228   LABSPEC 1.009 09/06/2019 2228   PHURINE 6.0 09/06/2019 2228   GLUCOSEU NEGATIVE 09/06/2019 2228   HGBUR NEGATIVE 09/06/2019 2228   BILIRUBINUR NEGATIVE 09/06/2019 2228   BILIRUBINUR NEGATIVE 03/06/2013 1100   KETONESUR NEGATIVE 09/06/2019 2228   PROTEINUR NEGATIVE 09/06/2019 2228   UROBILINOGEN negative 03/06/2013 1100   NITRITE NEGATIVE 09/06/2019 2228   LEUKOCYTESUR NEGATIVE 09/06/2019 2228    Radiological Exams on Admission: DG Chest Portable 1 View  Result Date: 09/06/2019 CLINICAL DATA:  Altered mental status EXAM: PORTABLE CHEST 1 VIEW COMPARISON:  07/30/2019 FINDINGS: Cardiomegaly. No focal opacity or pleural effusion. Aortic atherosclerosis. No pneumothorax. IMPRESSION: No active disease.  Cardiomegaly. Electronically Signed   By: Donavan Foil M.D.   On: 09/06/2019 22:06      Assessment/Plan Principal Problem:   Acute hepatic encephalopathy Patient is having altered mental status secondary to acute hepatic encephalopathy.  Patient was missing her normal dose of lactulose for the last 2 to 3 days that resulted in hyperammonemia and confusion.  Patient's abdomen is also tender and she is having lactic acidosis and these factors can also contribute to acute confusion.  Patient is given IV fluids in the ED.  Patient started on IV Zosyn because of abdominal tenderness.  Patient is also given a dose of lactulose for  hyperammonemia and will continue to monitor ammonia level in the morning.  Active Problems:   Hyperammonemia ([place]) Patient is given lactulose in the ED.  Continue to monitor ammonia level.  Continue home lactulose dose after confirmation by pharmacy.  Abdominal pain Abdominal tenderness with palpation in the setting of liver cirrhosis and ascites.  IV Zosyn for suspected SBP.  Blood and urine cultures ordered.  Lactic acidosis Lactic acid level greater than 3.  IV fluids given and patient started on IV Zosyn.  Continue to monitor lactic acid level.    Transaminitis Patient has history of chronic transaminitis.  Continue to monitor      Elevated INR Elevated INR secondary to liver cirrhosis.  Continue to monitor  Hyperglycemia Low-dose sliding scale insulin ordered Blood glucose monitoring and hypoglycemic protocol in place.   Patient will be started on home medication for chronic medical problems once the medication list confirmed by pharmacy.      DVT prophylaxis: No chemical prophylaxis because of elevated INR.  SCDs ordered Code Status: Full code Family Communication: Patient's daughter present at the bedside Disposition Plan: Patient will be discharged to home once the symptoms resolved Consults called: None Admission status: Observation/telemetry   Edmonia Lynch MD [facility] Pager [phone]   If 7PM-7AM, please contact  night-coverage [email or web address] Password [facility]  09/07/2019, 5:46 AM

## 2019-09-07 NOTE — Progress Notes (Signed)
Alert to self and place.  Had large soft bm.  Drank most of contrast and started feeling sick and received zofran which helped.  Did not vomit.  Blood glucose 58.  Received dextrose IV and recheck was 167

## 2019-09-07 NOTE — Progress Notes (Addendum)
Inpatient Diabetes Program Recommendations  AACE/ADA: New Consensus Statement on Inpatient Glycemic Control (2015)  Target Ranges:  Prepandial:   less than 140 mg/dL      Peak postprandial:   less than 180 mg/dL (1-2 hours)      Critically ill patients:  140 - 180 mg/dL   Lab Results  Component Value Date   GLUCAP 184 (H) 07/28/2019   HGBA1C 5.3 06/20/2019    Review of Glycemic Control  Diabetes history: DM2 Outpatient Diabetes medications: Glucotrol 5 mg bid + Basaglar 10 units qd + Janumet 50-500 mg bid Current orders for Inpatient glycemic control: Novolog sensitive tid + hs 0-5 units  Inpatient Diabetes Program Recommendations:   Attempted Multiple phone contact but busy (DM coordinator @ [facility]). Glucotrol was discontinued when she was discharged in April although remains on home medication list. Please evaluate home medications on discharge.  Thank you, Nani Gasser. Yuepheng Schaller, RN, MSN, CDE  Diabetes Coordinator Inpatient Glycemic Control Team Team Pager [phone] (8am-5pm) 09/07/2019 10:48 AM

## 2019-09-07 NOTE — Progress Notes (Signed)
PROGRESS NOTE  Brittany Russo QQP:[id number] DOB: 06-Feb-1946 DOA: 09/06/2019 PCP: Rosalee Kaufman, PA-C  Brief History:  74 year old female with a history of NASH cirrhosis, diabetes mellitus type 2, right above-the-knee amputation, hypertension, hyperlipidemia, endometrial carcinoma status post hysterectomy, radiation cystitis, GI bleed presenting with generalized weakness, confusion, and intermittent nausea and vomiting associated with decreased oral intake.  The patient's generalized weakness, decreased oral intake, and confusion have worsened over the past 2 to 3 days.  The patient is a poor historian secondary to her encephalopathy.  According to the patient's daughter, the patient has had intermittent nausea and vomiting for the last 2 to 3 weeks without any blood.  She has not had any hematochezia or melena.  There is not been any new medications.  Apparently, the patient's family had decreased the patient's dose of lactulose to once daily for the past 2 3 weeks.  Occasionally, the patient would not even take it on some days secondary to diarrhea.  There is not been any reports of fevers, chills, chest pain, shortness breath, coughing, hemoptysis, hematemesis, headache, neck pain. In the emergency department, the patient was afebrile hemodynamically stable.  She was noted to have an ammonia level of 80.  Urinalysis was negative for pyuria.  BMP was unremarkable.  WBC 4.2, hemoglobin 10.4, platelets 61,000.  The patient was admitted for encephalopathy secondary to hepatic encephalopathy.  Assessment/Plan: Hepatic encephalopathy -Presented with ammonia level 80 -Start lactulose twice daily -Repeat ammonia in the morning -Continue rifaximin  Acute metabolic encephalopathy -Primarily due to hepatic encephalopathy, elevated ammonia -Check B12 -TSH -Free T4 -Minimize hypnotic medications -UA neg for pyuria  NASH Cirrhosis -Holding furosemide and spironolactone for  today -Reassess for restart tomorrow -Palliative medicine consult for goals of care discussion  Lactic acidosis -Likely due to volume depletion, doubt sepsis -Continue IV fluids -Repeat lactic acid  Nausea and vomiting -suspect due to portal HTN gastropathy -start PPI -clear liquids for now  Essential hypertension -Holding atenolol for today -Reassess for restart tomorrow  Diabetes mellitus type 2 -Holding glipizide and Janumet -Holding Lantus secondary to hypoglycemia -NovoLog sliding scale  Anxiety -Holding alprazolam for now secondary to encephalopathy  Status post right AKA -Patient had a complicated right femur fracture requiring right AKA by Dr. Ihor Gully 07/08/2019 -The patient stayed in [facility] from 07/04/2019 to 07/28/2019 -PT evaluation  Abdominal pain -CT abd/pelvis      Status is: Observation  The patient will require care spanning > 2 midnights and should be moved to inpatient because: Altered mental status  Dispo: The patient is from: Home              Anticipated d/c is to: Home              Anticipated d/c date is: 2 days              Patient currently is not medically stable to d/c.    Total time spent 45 minutes.  Greater than 50% spent face to face counseling and coordinating care.     Family Communication:   Daughter updated 6/17  Consultants:  palliative  Code Status:  FULL  DVT Prophylaxis:  [place] Heparin / [place] Lovenox   Procedures: Zosyn 6/17>>>     Subjective: Pt is pleasantly confused.  Denies f/c, cp, sob, n/v/d.  Objective: Vitals:   09/06/19 2132 09/06/19 2200 09/06/19 2230 09/07/19 0341  BP: (!) 129/50 (!) 129/57 (!) 130/52 (!) 151/62  Pulse: (!) 59 (!) 57 (!) 58 64  Resp: 17 15 12 16   Temp:    98.6 F (37 C)  TempSrc:    Oral  SpO2: 100% 98% 98% 99%  Weight:      Height:        Intake/Output Summary (Last 24 hours) at 09/07/2019 0816 Last data filed at 09/07/2019 0443 Gross per 24 hour  Intake 10.7 ml  Output --   Net 10.7 ml   Weight change:  Exam:   General:  Pt is alert, follows commands appropriately, not in acute distress  HEENT: No icterus, No thrush, No neck mass, [place]/AT  Cardiovascular: RRR, S1/S2, no rubs, no gallops  Respiratory: CTA bilaterally, no wheezing, no crackles, no rhonchi  Abdomen: Soft/+BS, non tender, non distended, no guarding  Extremities: No edema, No lymphangitis, No petechiae, No rashes, no synovitis   Data Reviewed: I have personally reviewed following labs and imaging studies Basic Metabolic Panel: Recent Labs  Lab 09/06/19 2043 09/07/19 0442  NA 132* 137  K 4.1 3.9  CL 97* 106  CO2 23 24  GLUCOSE 122* 53*  BUN 13 11  CREATININE 0.73 0.66  CALCIUM 8.6* 8.4*   Liver Function Tests: Recent Labs  Lab 09/06/19 2043 09/07/19 0442  AST 85* 74*  ALT 68* 60*  ALKPHOS 97 93  BILITOT 1.3* 1.1  PROT 5.8* 5.2*  ALBUMIN 2.6* 2.3*   No results for input(s): LIPASE, AMYLASE in the last 168 hours. Recent Labs  Lab 09/06/19 2043 09/07/19 0442  AMMONIA 80* 52*   Coagulation Profile: No results for input(s): INR, PROTIME in the last 168 hours. CBC: Recent Labs  Lab 09/06/19 2043 09/07/19 0442  WBC 4.2 3.4*  NEUTROABS 2.7  --   HGB 10.4* 9.5*  HCT 34.2* 31.4*  MCV 90.7 92.1  PLT 72* 61*   Cardiac Enzymes: No results for input(s): CKTOTAL, CKMB, CKMBINDEX, TROPONINI in the last 168 hours. BNP: Invalid input(s): POCBNP CBG: No results for input(s): GLUCAP in the last 168 hours. HbA1C: No results for input(s): HGBA1C in the last 72 hours. Urine analysis:    Component Value Date/Time   COLORURINE YELLOW 09/06/2019 2228   APPEARANCEUR CLEAR 09/06/2019 2228   LABSPEC 1.009 09/06/2019 2228   PHURINE 6.0 09/06/2019 2228   GLUCOSEU NEGATIVE 09/06/2019 2228   HGBUR NEGATIVE 09/06/2019 2228   BILIRUBINUR NEGATIVE 09/06/2019 2228   BILIRUBINUR NEGATIVE 03/06/2013 1100   KETONESUR NEGATIVE 09/06/2019 2228   PROTEINUR NEGATIVE 09/06/2019 2228    UROBILINOGEN negative 03/06/2013 1100   NITRITE NEGATIVE 09/06/2019 2228   LEUKOCYTESUR NEGATIVE 09/06/2019 2228   Sepsis Labs: @LABRCNTIP (procalcitonin:4,lacticidven:4) ) Recent Results (from the past 240 hour(s))  Culture, blood (routine x 2)     Status: None (Preliminary result)   Collection Time: 09/06/19  8:53 PM   Specimen: BLOOD LEFT ARM  Result Value Ref Range Status   Specimen Description BLOOD LEFT ARM  Final   Special Requests   Final    BOTTLES DRAWN AEROBIC AND ANAEROBIC Blood Culture adequate volume   Culture   Final    NO GROWTH < 12 HOURS Performed at [facility], [address], [place], [place] [address]    Report Status PENDING  Incomplete  Culture, blood (routine x 2)     Status: None (Preliminary result)   Collection Time: 09/06/19  9:00 PM   Specimen: BLOOD LEFT ARM  Result Value Ref Range Status   Specimen Description BLOOD LEFT ARM  Final  Special Requests   Final    BOTTLES DRAWN AEROBIC AND ANAEROBIC Blood Culture adequate volume   Culture   Final    NO GROWTH < 12 HOURS Performed at [facility], [address] [place], [place] [address]    Report Status PENDING  Incomplete  SARS Coronavirus 2 by RT PCR (hospital order, performed in [facility] hospital lab) Nasopharyngeal Nasopharyngeal Swab     Status: None   Collection Time: 09/06/19 10:28 PM   Specimen: Nasopharyngeal Swab  Result Value Ref Range Status   SARS Coronavirus 2 NEGATIVE NEGATIVE Final    Comment: (NOTE) SARS-CoV-2 target nucleic acids are NOT DETECTED.  The SARS-CoV-2 RNA is generally detectable in upper and lower respiratory specimens during the acute phase of infection. The lowest concentration of SARS-CoV-2 viral copies this assay can detect is 250 copies / mL. A negative result does not preclude SARS-CoV-2 infection and should not be used as the sole basis for treatment or other patient management decisions.  A negative result may occur with improper specimen collection  / handling, submission of specimen other than nasopharyngeal swab, presence of viral mutation(s) within the areas targeted by this assay, and inadequate number of viral copies (<250 copies / mL). A negative result must be combined with clinical observations, patient history, and epidemiological information.  Fact Sheet for Patients:   StrictlyIdeas.no  Fact Sheet for Healthcare Providers: BankingDealers.co.za  This test is not yet approved or  cleared by the [place] FDA and has been authorized for detection and/or diagnosis of SARS-CoV-2 by FDA under an Emergency Use Authorization (EUA).  This EUA will remain in effect (meaning this test can be used) for the duration of the COVID-19 declaration under Section 564(b)(1) of the Act, 21 U.S.C. section 360bbb-3(b)(1), unless the authorization is terminated or revoked sooner.  Performed at [facility], [address] [place], [place] [address]      Scheduled Meds: . atorvastatin  80 mg Oral Q2000  . loratadine  10 mg Oral q AM  . rifaximin  550 mg Oral BID   Continuous Infusions: . piperacillin-tazobactam (ZOSYN)  IV 12.5 mL/hr at 09/07/19 0443    Procedures/Studies: DG Chest Portable 1 View  Result Date: 09/06/2019 CLINICAL DATA:  Altered mental status EXAM: PORTABLE CHEST 1 VIEW COMPARISON:  07/30/2019 FINDINGS: Cardiomegaly. No focal opacity or pleural effusion. Aortic atherosclerosis. No pneumothorax. IMPRESSION: No active disease.  Cardiomegaly. Electronically Signed   By: Donavan Foil M.D.   On: 09/06/2019 22:06    Orson Eva, DO  [facility]  If 7PM-7AM, please contact night-coverage [email or web address] Password [email or web address] 09/07/2019, 8:16 AM   LOS: 0 days

## 2019-09-07 NOTE — Progress Notes (Signed)
Supervisor gave allowance for brother to visit since he was arriving today specifically to see the patient and did not know that she was going to be in the hospital.

## 2019-09-08 DIAGNOSIS — K7290 Hepatic failure, unspecified without coma: Secondary | ICD-10-CM

## 2019-09-08 DIAGNOSIS — D696 Thrombocytopenia, unspecified: Secondary | ICD-10-CM

## 2019-09-08 LAB — URINE CULTURE: Culture: <10000 — AB

## 2019-09-08 LAB — COMPREHENSIVE METABOLIC PANEL
ALT: 56 U/L — ABNORMAL HIGH (ref 0–44)
AST: 68 U/L — ABNORMAL HIGH (ref 15–41)
Albumin: 2.4 g/dL — ABNORMAL LOW (ref 3.5–5.0)
Alkaline Phosphatase: 89 U/L (ref 38–126)
Anion gap: 10 (ref 5–15)
BUN: 11 mg/dL (ref 8–23)
CO2: 21 mmol/L — ABNORMAL LOW (ref 22–32)
Calcium: 8.5 mg/dL — ABNORMAL LOW (ref 8.9–10.3)
Chloride: 102 mmol/L (ref 98–111)
Creatinine, Ser: 0.87 mg/dL (ref 0.44–1.00)
GFR calc Af Amer: >60 mL/min (ref >60)
GFR calc non Af Amer: >60 mL/min (ref >60)
Glucose, Bld: 96 mg/dL (ref 70–99)
Potassium: 4.1 mmol/L (ref 3.5–5.1)
Sodium: 133 mmol/L — ABNORMAL LOW (ref 135–145)
Total Bilirubin: 2 mg/dL — ABNORMAL HIGH (ref 0.3–1.2)
Total Protein: 5 g/dL — ABNORMAL LOW (ref 6.5–8.1)

## 2019-09-08 LAB — AMMONIA: Ammonia: 19 umol/L (ref 9–35)

## 2019-09-08 LAB — HEMOGLOBIN A1C
Hgb A1c MFr Bld: 5.6 % (ref 4.8–5.6)
Mean Plasma Glucose: 114 mg/dL

## 2019-09-08 LAB — LACTIC ACID, PLASMA: Lactic Acid, Venous: 1.5 mmol/L (ref 0.5–1.9)

## 2019-09-08 LAB — GLUCOSE, CAPILLARY
Glucose-Capillary: 104 mg/dL — ABNORMAL HIGH (ref 70–99)
Glucose-Capillary: 165 mg/dL — ABNORMAL HIGH (ref 70–99)

## 2019-09-08 MED ORDER — PANTOPRAZOLE SODIUM 40 MG PO TBEC: 40.0000 mg | DELAYED_RELEASE_TABLET | Freq: Every day | ORAL | 1 refills | Status: AC

## 2019-09-08 MED ORDER — ATENOLOL 25 MG PO TABS: 50.0000 mg | ORAL_TABLET | Freq: Every morning | ORAL | 1 refills | Status: DC

## 2019-09-08 MED ORDER — CYANOCOBALAMIN 500 MCG PO TABS: 500.0000 ug | ORAL_TABLET | Freq: Every day | ORAL | Status: DC

## 2019-09-08 MED ORDER — VITAMIN B-12 100 MCG PO TABS: 500.0000 ug | ORAL_TABLET | Freq: Every day | ORAL | Status: DC

## 2019-09-08 MED ORDER — TRAMADOL HCL 50 MG PO TABS
50.0000 mg | ORAL_TABLET | Freq: Four times a day (QID) | ORAL | Status: DC | PRN
  Administered 2019-09-08: 50 mg via ORAL
  Filled 2019-09-08: qty 1

## 2019-09-08 NOTE — Discharge Summary (Signed)
Physician Discharge Summary  Brittany Russo PNT:[id number] DOB: 1945/07/14 DOA: 09/06/2019  PCP: Rosalee Kaufman, PA-C  Admit date: 09/06/2019 Discharge date: 09/08/2019  Admitted From: Home Disposition:  Home   Recommendations for Outpatient Follow-up:  1. Follow up with PCP in 1-2 weeks 2. Please obtain BMP/CBC in one week   Home Health: YES Equipment/Devices: HHPT  Discharge Condition: Stable CODE STATUS: FULL Diet recommendation: Heart Healthy / Carb Modified   Brief/Interim Summary: 74 year old female with a history of NASH cirrhosis, diabetes mellitus type 2, right above-the-knee amputation, hypertension, hyperlipidemia, endometrial carcinoma status post hysterectomy, radiation cystitis, GI bleed presenting with generalized weakness, confusion, and intermittent nausea and vomiting associated with decreased oral intake.  The patient's generalized weakness, decreased oral intake, and confusion have worsened over the past 2 to 3 days.  The patient is a poor historian secondary to her encephalopathy.  According to the patient's daughter, the patient has had intermittent nausea and vomiting for the last 2 to 3 weeks without any blood.  She has not had any hematochezia or melena.  There is not been any new medications.  Apparently, the patient's family had decreased the patient's dose of lactulose to once daily for the past 2 3 weeks.  Occasionally, the patient would not even take it on some days secondary to diarrhea.  There is not been any reports of fevers, chills, chest pain, shortness breath, coughing, hemoptysis, hematemesis, headache, neck pain. In the emergency department, the patient was afebrile hemodynamically stable.  She was noted to have an ammonia level of 80.  Urinalysis was negative for pyuria.  BMP was unremarkable.  WBC 4.2, hemoglobin 10.4, platelets 61,000.  The patient was admitted for encephalopathy secondary to hepatic encephalopathy.  Discharge Diagnoses:    Hepatic encephalopathy -Presented with ammonia level 80>>>19 -Started lactulose 30 g twice daily -Continue rifaximin -educate patient and daughter about taking lactulose daily and titrate dose to achieve 2-3 loose BMs daily  Acute metabolic encephalopathy -Primarily due to hepatic encephalopathy, elevated ammonia -Check B12--242-->replete -TSH--3.314 -Free T4--1.10 -Minimize hypnotic medications -UA neg for pyuria -patient has cognitive impairment at baseline--usually does not know date  NASH Cirrhosis -Holding furosemide and spironolactone for today -Reassess for restart tomorrow  Lactic acidosis -Likely due to volume depletion, doubt sepsis -Continue IV fluids -Repeat lactic acid--1.5  Nausea and vomiting -suspect due to portal HTN gastropathy and DM gastroparesis -started PPI -overall improved -clear liquids initially>>>advanced to soft diet which pt tolerated  Essential hypertension -Holding atenolol for today>>>restart at lower dose  Diabetes mellitus type 2 -Holding glipizide and Janumet -Holding Lantus initially>>resume basaglar after dc -NovoLog sliding scale -will not restart glipizide after discharge due to unreliable po intake and intermittent hypoglycemia  Anxiety -Holding alprazolam for now secondary to encephalopathy  Status post right AKA -Patient had a complicated right femur fracture requiring right AKA by Dr. Ihor Gully 07/08/2019 -The patient stayed in [place] from 07/04/2019 to 07/28/2019 -resume HHPT after d/c  Abdominal pain -09/07/19 CT abd/pelvis--neg for acute findings -overall resolved after BMs -diet advanced which she was able to tolerate  Discharge Instructions   Allergies as of 09/08/2019      Reactions   Asa [aspirin] Other (See Comments)   Pt not allergic but cannot take due to bleeding    Other Other (See Comments)   Any jewelry metal-hives and itching       Medication List    STOP taking these medications   doxycycline  100 MG capsule Commonly known as: VIBRAMYCIN  glipiZIDE 5 MG tablet Commonly known as: GLUCOTROL   HYDROcodone-acetaminophen 7.5-325 MG tablet Commonly known as: NORCO   mesalamine 1000 MG suppository Commonly known as: CANASA   methocarbamol 500 MG tablet Commonly known as: Robaxin   polyethylene glycol 17 g packet Commonly known as: MIRALAX / GLYCOLAX     TAKE these medications   acetaminophen 325 MG tablet Commonly known as: TYLENOL Take 1-2 tablets (325-650 mg total) by mouth every 4 (four) hours as needed for mild pain.   albuterol 108 (90 Base) MCG/ACT inhaler Commonly known as: VENTOLIN HFA Inhale 1-2 puffs into the lungs every 6 (six) hours as needed for wheezing or shortness of breath.   ALPRAZolam 0.25 MG tablet Commonly known as: XANAX Take 1 tablet (0.25 mg total) by mouth 2 (two) times daily.   atenolol 25 MG tablet Commonly known as: TENORMIN Take 2 tablets (50 mg total) by mouth every morning. What changed: medication strength   atorvastatin 80 MG tablet Commonly known as: LIPITOR Take 80 mg by mouth daily at 8 pm.   Basaglar KwikPen 100 UNIT/ML Inject 10 Units into the skin See admin instructions. If blood sugars are 100 or more, give 10 units in the morning. *checking blood sugar levels 3 times throughout the day to maintain levels. Only giving 2nd dose if needed for high blood sugar levels (10-20 units)   buPROPion 150 MG 24 hr tablet Commonly known as: WELLBUTRIN XL Take 150 mg by mouth in the morning.   escitalopram 20 MG tablet Commonly known as: LEXAPRO Take 20 mg by mouth in the morning.   ferrous sulfate 325 (65 FE) MG tablet Commonly known as: FerrouSul Take 1 tablet (325 mg total) by mouth 3 (three) times daily with meals. What changed: when to take this   fluticasone 50 MCG/ACT nasal spray Commonly known as: FLONASE Place 1 spray into both nostrils in the morning.   furosemide 20 MG tablet Commonly known as: LASIX Take 1  tablet (20 mg total) by mouth 2 (two) times daily.   gabapentin 300 MG capsule Commonly known as: NEURONTIN Take 1 capsule (300 mg total) by mouth 2 (two) times daily. What changed:   when to take this  reasons to take this   lactulose 10 GM/15ML solution Commonly known as: CHRONULAC Take 22.5 mLs (15 g total) by mouth 2 (two) times daily. What changed:   how much to take  when to take this   lip balm ointment Apply topically as needed for lip care.   loratadine 10 MG tablet Commonly known as: CLARITIN Take 10 mg by mouth in the morning.   multivitamin with minerals Tabs tablet Take 1 tablet by mouth daily.   pantoprazole 40 MG tablet Commonly known as: PROTONIX Take 1 tablet (40 mg total) by mouth daily.   potassium chloride 10 MEQ tablet Commonly known as: KLOR-CON Take 1 tablet (10 mEq total) by mouth daily. What changed: when to take this   rifaximin 550 MG Tabs tablet Commonly known as: XIFAXAN Take 550 mg by mouth 2 (two) times daily.   sitaGLIPtin-metformin 50-500 MG tablet Commonly known as: JANUMET Take 1 tablet by mouth 2 (two) times daily with a meal.   spironolactone 100 MG tablet Commonly known as: ALDACTONE Take 1 tablet (100 mg total) by mouth daily. What changed: when to take this   traMADol 50 MG tablet Commonly known as: ULTRAM Take 1 tablet (50 mg total) by mouth every 6 (six) hours as needed for moderate  pain.   traZODone 50 MG tablet Commonly known as: DESYREL Take 50 mg by mouth at bedtime as needed for sleep.   vitamin B-12 500 MCG tablet Commonly known as: CYANOCOBALAMIN Take 1 tablet (500 mcg total) by mouth daily.       Allergies  Allergen Reactions  . Asa [Aspirin] Other (See Comments)    Pt not allergic but cannot take due to bleeding   . Other Other (See Comments)    Any jewelry metal-hives and itching     Consultations:  none   Procedures/Studies: CT ABDOMEN PELVIS WO CONTRAST  Result Date:  09/07/2019 CLINICAL DATA:  Generalized weakness with confusion and intermittent nausea/vomiting. EXAM: CT ABDOMEN AND PELVIS WITHOUT CONTRAST TECHNIQUE: Multidetector CT imaging of the abdomen and pelvis was performed following the standard protocol without IV contrast. COMPARISON:  07/30/2019 FINDINGS: Lower chest: 2 mm left lower lobe pulmonary nodule (25/4) not visualized on prior exam. Hepatobiliary: Irregular liver contour suggests cirrhosis. No focal abnormality in the liver on this study without intravenous contrast. Gallbladder is distended. No intrahepatic or extrahepatic biliary dilation. Pancreas: No focal mass lesion. No dilatation of the main duct. No intraparenchymal cyst. No peripancreatic edema. Spleen: No splenomegaly. No focal mass lesion. Adrenals/Urinary Tract: No adrenal nodule or mass. Unremarkable noncontrast appearance of the kidneys. No evidence for hydroureter. The urinary bladder appears normal for the degree of distention. Stomach/Bowel: Stomach is unremarkable. No gastric wall thickening. No evidence of outlet obstruction. Duodenum is normally positioned as is the ligament of Treitz. No small bowel wall thickening. No small bowel dilatation. The terminal ileum is normal. The appendix is normal. No gross colonic mass. No colonic wall thickening. Vascular/Lymphatic: There is abdominal aortic atherosclerosis without aneurysm. Upper normal hepatoduodenal ligament lymph node measures 9 mm short axis. There is no gastrohepatic or hepatoduodenal ligament lymphadenopathy. No retroperitoneal or mesenteric lymphadenopathy. No pelvic sidewall lymphadenopathy. Reproductive: The uterus is surgically absent. There is no adnexal mass. Other: Small volume perihepatic ascites tracks into the gallbladder fossa. There is trace free fluid in the anterior pelvis and cul-de-sac. Musculoskeletal: No worrisome lytic or sclerotic osseous abnormality. IMPRESSION: 1. No acute findings in the abdomen or pelvis.  Specifically, no findings to explain the patient's history of nausea and vomiting. 2. Irregular liver contour compatible with cirrhosis. 3. Small volume ascites. 4. 2 mm left lower lobe pulmonary nodule not visualized on prior exam. No follow-up needed if patient is low-risk. Non-contrast chest CT can be considered in 12 months if patient is high-risk. This recommendation follows the consensus statement: Guidelines for Management of Incidental Pulmonary Nodules Detected on CT Images: From the Fleischner Society 2017; Radiology 2017; 284:228-243. 5. Aortic Atherosclerosis (ICD10-I70.0). Electronically Signed   By: Misty Stanley M.D.   On: 09/07/2019 14:17   DG Chest Portable 1 View  Result Date: 09/06/2019 CLINICAL DATA:  Altered mental status EXAM: PORTABLE CHEST 1 VIEW COMPARISON:  07/30/2019 FINDINGS: Cardiomegaly. No focal opacity or pleural effusion. Aortic atherosclerosis. No pneumothorax. IMPRESSION: No active disease.  Cardiomegaly. Electronically Signed   By: Donavan Foil M.D.   On: 09/06/2019 22:06        Discharge Exam: Vitals:   09/07/19 2004 09/08/19 0500  BP: (!) 130/56 (!) 131/50  Pulse: 78 74  Resp: (!) 24 16  Temp: 98.1 F (36.7 C) 98.2 F (36.8 C)  SpO2: 99% 100%   Vitals:   09/07/19 0341 09/07/19 1413 09/07/19 2004 09/08/19 0500  BP: (!) 151/62 (!) 133/59 (!) 130/56 (!) 131/50  Pulse:  64 70 78 74  Resp: 16 16 (!) 24 16  Temp: 98.6 F (37 C) 99 F (37.2 C) 98.1 F (36.7 C) 98.2 F (36.8 C)  TempSrc: Oral Oral Oral Oral  SpO2: 99% 100% 99% 100%  Weight:      Height:        General: Pt is alert, awake, not in acute distress Cardiovascular: RRR, S1/S2 +, no rubs, no gallops Respiratory: CTA bilaterally, no wheezing, no rhonchi Abdominal: Soft, NT, ND, bowel sounds + Extremities: right stump without drainage or erythema   The results of significant diagnostics from this hospitalization (including imaging, microbiology, ancillary and laboratory) are listed  below for reference.    Significant Diagnostic Studies: CT ABDOMEN PELVIS WO CONTRAST  Result Date: 09/07/2019 CLINICAL DATA:  Generalized weakness with confusion and intermittent nausea/vomiting. EXAM: CT ABDOMEN AND PELVIS WITHOUT CONTRAST TECHNIQUE: Multidetector CT imaging of the abdomen and pelvis was performed following the standard protocol without IV contrast. COMPARISON:  07/30/2019 FINDINGS: Lower chest: 2 mm left lower lobe pulmonary nodule (25/4) not visualized on prior exam. Hepatobiliary: Irregular liver contour suggests cirrhosis. No focal abnormality in the liver on this study without intravenous contrast. Gallbladder is distended. No intrahepatic or extrahepatic biliary dilation. Pancreas: No focal mass lesion. No dilatation of the main duct. No intraparenchymal cyst. No peripancreatic edema. Spleen: No splenomegaly. No focal mass lesion. Adrenals/Urinary Tract: No adrenal nodule or mass. Unremarkable noncontrast appearance of the kidneys. No evidence for hydroureter. The urinary bladder appears normal for the degree of distention. Stomach/Bowel: Stomach is unremarkable. No gastric wall thickening. No evidence of outlet obstruction. Duodenum is normally positioned as is the ligament of Treitz. No small bowel wall thickening. No small bowel dilatation. The terminal ileum is normal. The appendix is normal. No gross colonic mass. No colonic wall thickening. Vascular/Lymphatic: There is abdominal aortic atherosclerosis without aneurysm. Upper normal hepatoduodenal ligament lymph node measures 9 mm short axis. There is no gastrohepatic or hepatoduodenal ligament lymphadenopathy. No retroperitoneal or mesenteric lymphadenopathy. No pelvic sidewall lymphadenopathy. Reproductive: The uterus is surgically absent. There is no adnexal mass. Other: Small volume perihepatic ascites tracks into the gallbladder fossa. There is trace free fluid in the anterior pelvis and cul-de-sac. Musculoskeletal: No  worrisome lytic or sclerotic osseous abnormality. IMPRESSION: 1. No acute findings in the abdomen or pelvis. Specifically, no findings to explain the patient's history of nausea and vomiting. 2. Irregular liver contour compatible with cirrhosis. 3. Small volume ascites. 4. 2 mm left lower lobe pulmonary nodule not visualized on prior exam. No follow-up needed if patient is low-risk. Non-contrast chest CT can be considered in 12 months if patient is high-risk. This recommendation follows the consensus statement: Guidelines for Management of Incidental Pulmonary Nodules Detected on CT Images: From the Fleischner Society 2017; Radiology 2017; 284:228-243. 5. Aortic Atherosclerosis (ICD10-I70.0). Electronically Signed   By: Misty Stanley M.D.   On: 09/07/2019 14:17   DG Chest Portable 1 View  Result Date: 09/06/2019 CLINICAL DATA:  Altered mental status EXAM: PORTABLE CHEST 1 VIEW COMPARISON:  07/30/2019 FINDINGS: Cardiomegaly. No focal opacity or pleural effusion. Aortic atherosclerosis. No pneumothorax. IMPRESSION: No active disease.  Cardiomegaly. Electronically Signed   By: Donavan Foil M.D.   On: 09/06/2019 22:06     Microbiology: Recent Results (from the past 240 hour(s))  Culture, blood (routine x 2)     Status: None (Preliminary result)   Collection Time: 09/06/19  8:53 PM   Specimen: BLOOD LEFT ARM  Result Value Ref Range  Status   Specimen Description BLOOD LEFT ARM  Final   Special Requests   Final    BOTTLES DRAWN AEROBIC AND ANAEROBIC Blood Culture adequate volume   Culture   Final    NO GROWTH 2 DAYS Performed at [facility], [address], [place], [place] [address]    Report Status PENDING  Incomplete  Culture, blood (routine x 2)     Status: None (Preliminary result)   Collection Time: 09/06/19  9:00 PM   Specimen: BLOOD LEFT ARM  Result Value Ref Range Status   Specimen Description BLOOD LEFT ARM  Final   Special Requests   Final    BOTTLES DRAWN AEROBIC AND ANAEROBIC Blood  Culture adequate volume   Culture   Final    NO GROWTH 2 DAYS Performed at [facility], [address], [place], [place] [address]    Report Status PENDING  Incomplete  Urine culture     Status: Abnormal   Collection Time: 09/06/19 10:28 PM   Specimen: Urine, Random  Result Value Ref Range Status   Specimen Description   Final    URINE, RANDOM Performed at [facility], [address], [place], [place] [address]    Special Requests   Final    NONE Performed at [facility], [address], [place], [place] [address]    Culture (A)  Final    <10,000 COLONIES/mL INSIGNIFICANT GROWTH Performed at [facility], [place] [address] [place], [place] [address]    Report Status 09/08/2019 FINAL  Final  SARS Coronavirus 2 by RT PCR (hospital order, performed in [facility] hospital lab) Nasopharyngeal Nasopharyngeal Swab     Status: None   Collection Time: 09/06/19 10:28 PM   Specimen: Nasopharyngeal Swab  Result Value Ref Range Status   SARS Coronavirus 2 NEGATIVE NEGATIVE Final    Comment: (NOTE) SARS-CoV-2 target nucleic acids are NOT DETECTED.  The SARS-CoV-2 RNA is generally detectable in upper and lower respiratory specimens during the acute phase of infection. The lowest concentration of SARS-CoV-2 viral copies this assay can detect is 250 copies / mL. A negative result does not preclude SARS-CoV-2 infection and should not be used as the sole basis for treatment or other patient management decisions.  A negative result may occur with improper specimen collection / handling, submission of specimen other than nasopharyngeal swab, presence of viral mutation(s) within the areas targeted by this assay, and inadequate number of viral copies (<250 copies / mL). A negative result must be combined with clinical observations, patient history, and epidemiological information.  Fact Sheet for Patients:   StrictlyIdeas.no  Fact Sheet for Healthcare  Providers: BankingDealers.co.za  This test is not yet approved or  cleared by the [place] FDA and has been authorized for detection and/or diagnosis of SARS-CoV-2 by FDA under an Emergency Use Authorization (EUA).  This EUA will remain in effect (meaning this test can be used) for the duration of the COVID-19 declaration under Section 564(b)(1) of the Act, 21 U.S.C. section 360bbb-3(b)(1), unless the authorization is terminated or revoked sooner.  Performed at [facility], [address] [place], [place] [address]      Labs: Basic Metabolic Panel: Recent Labs  Lab 09/06/19 2043 09/06/19 2043 09/07/19 0442 09/08/19 0633  NA 132*  --  137 133*  K 4.1   < > 3.9 4.1  CL 97*  --  106 102  CO2 23  --  24 21*  GLUCOSE 122*  --  53*  96  BUN 13  --  11 11  CREATININE 0.73  --  0.66 0.87  CALCIUM 8.6*  --  8.4* 8.5*   < > = values in this interval not displayed.   Liver Function Tests: Recent Labs  Lab 09/06/19 2043 09/07/19 0442 09/08/19 0633  AST 85* 74* 68*  ALT 68* 60* 56*  ALKPHOS 97 93 89  BILITOT 1.3* 1.1 2.0*  PROT 5.8* 5.2* 5.0*  ALBUMIN 2.6* 2.3* 2.4*   No results for input(s): LIPASE, AMYLASE in the last 168 hours. Recent Labs  Lab 09/06/19 2043 09/07/19 0442 09/08/19 0633  AMMONIA 80* 52* 19   CBC: Recent Labs  Lab 09/06/19 2043 09/07/19 0442  WBC 4.2 3.4*  NEUTROABS 2.7  --   HGB 10.4* 9.5*  HCT 34.2* 31.4*  MCV 90.7 92.1  PLT 72* 61*   Cardiac Enzymes: No results for input(s): CKTOTAL, CKMB, CKMBINDEX, TROPONINI in the last 168 hours. BNP: Invalid input(s): POCBNP CBG: Recent Labs  Lab 09/07/19 1157 09/07/19 1651 09/07/19 2052 09/08/19 0746 09/08/19 1131  GLUCAP 167* 134* 102* 104* 165*    Time coordinating discharge:  36 minutes  Signed:  Orson Eva, DO [facility] Pager: [phone] 09/08/2019, 12:02 PM

## 2019-09-08 NOTE — Care Management Important Message (Signed)
Important Message  Patient Details  Name: Brittany Russo MRN: [id number] Date of Birth: 1945-08-17   Medicare Important Message Given:  Yes     Tommy Medal 09/08/2019, 4:20 PM

## 2019-09-08 NOTE — Progress Notes (Signed)
Nsg Discharge Note  Admit Date:  09/06/2019 Discharge date: 09/08/2019   [name] to be D/C'd Home via EMS per MD order.  AVS completed.  Copy for chart, and copy for patient signed, and dated. Patient/caregiver able to verbalize understanding. Patients family called and updated of the discharge summary IV removed   Discharge Medication: Allergies as of 09/08/2019      Reactions   Asa [aspirin] Other (See Comments)   Pt not allergic but cannot take due to bleeding    Other Other (See Comments)   Any jewelry metal-hives and itching       Medication List    STOP taking these medications   doxycycline 100 MG capsule Commonly known as: VIBRAMYCIN   glipiZIDE 5 MG tablet Commonly known as: GLUCOTROL   HYDROcodone-acetaminophen 7.5-325 MG tablet Commonly known as: NORCO   mesalamine 1000 MG suppository Commonly known as: CANASA   methocarbamol 500 MG tablet Commonly known as: Robaxin   polyethylene glycol 17 g packet Commonly known as: MIRALAX / GLYCOLAX     TAKE these medications   acetaminophen 325 MG tablet Commonly known as: TYLENOL Take 1-2 tablets (325-650 mg total) by mouth every 4 (four) hours as needed for mild pain.   albuterol 108 (90 Base) MCG/ACT inhaler Commonly known as: VENTOLIN HFA Inhale 1-2 puffs into the lungs every 6 (six) hours as needed for wheezing or shortness of breath.   ALPRAZolam 0.25 MG tablet Commonly known as: XANAX Take 1 tablet (0.25 mg total) by mouth 2 (two) times daily.   atenolol 25 MG tablet Commonly known as: TENORMIN Take 2 tablets (50 mg total) by mouth every morning. What changed: medication strength   atorvastatin 80 MG tablet Commonly known as: LIPITOR Take 80 mg by mouth daily at 8 pm.   Basaglar KwikPen 100 UNIT/ML Inject 10 Units into the skin See admin instructions. If blood sugars are 100 or more, give 10 units in the morning. *checking blood sugar levels 3 times throughout the day to maintain levels. Only  giving 2nd dose if needed for high blood sugar levels (10-20 units)   buPROPion 150 MG 24 hr tablet Commonly known as: WELLBUTRIN XL Take 150 mg by mouth in the morning.   escitalopram 20 MG tablet Commonly known as: LEXAPRO Take 20 mg by mouth in the morning.   ferrous sulfate 325 (65 FE) MG tablet Commonly known as: FerrouSul Take 1 tablet (325 mg total) by mouth 3 (three) times daily with meals. What changed: when to take this   fluticasone 50 MCG/ACT nasal spray Commonly known as: FLONASE Place 1 spray into both nostrils in the morning.   furosemide 20 MG tablet Commonly known as: LASIX Take 1 tablet (20 mg total) by mouth 2 (two) times daily.   gabapentin 300 MG capsule Commonly known as: NEURONTIN Take 1 capsule (300 mg total) by mouth 2 (two) times daily. What changed:   when to take this  reasons to take this   lactulose 10 GM/15ML solution Commonly known as: CHRONULAC Take 22.5 mLs (15 g total) by mouth 2 (two) times daily. What changed:   how much to take  when to take this   lip balm ointment Apply topically as needed for lip care.   loratadine 10 MG tablet Commonly known as: CLARITIN Take 10 mg by mouth in the morning.   multivitamin with minerals Tabs tablet Take 1 tablet by mouth daily.   pantoprazole 40 MG tablet Commonly known as: PROTONIX  Take 1 tablet (40 mg total) by mouth daily.   potassium chloride 10 MEQ tablet Commonly known as: KLOR-CON Take 1 tablet (10 mEq total) by mouth daily. What changed: when to take this   rifaximin 550 MG Tabs tablet Commonly known as: XIFAXAN Take 550 mg by mouth 2 (two) times daily.   sitaGLIPtin-metformin 50-500 MG tablet Commonly known as: JANUMET Take 1 tablet by mouth 2 (two) times daily with a meal.   spironolactone 100 MG tablet Commonly known as: ALDACTONE Take 1 tablet (100 mg total) by mouth daily. What changed: when to take this   traMADol 50 MG tablet Commonly known as:  ULTRAM Take 1 tablet (50 mg total) by mouth every 6 (six) hours as needed for moderate pain.   traZODone 50 MG tablet Commonly known as: DESYREL Take 50 mg by mouth at bedtime as needed for sleep.   vitamin B-12 500 MCG tablet Commonly known as: CYANOCOBALAMIN Take 1 tablet (500 mcg total) by mouth daily.       Discharge Assessment: Vitals:   09/08/19 0500 09/08/19 1346  BP: (!) 131/50 (!) 156/62  Pulse: 74 84  Resp: 16 20  Temp: 98.2 F (36.8 C) 98 F (36.7 C)  SpO2: 100% 100%   Skin clean, dry and intact without evidence of skin break down, no evidence of skin tears noted. IV catheter discontinued intact. Site without signs and symptoms of complications - no redness or edema noted at insertion site, patient denies c/o pain - only slight tenderness at site.  Dressing with slight pressure applied.  D/c Instructions-Education: Discharge instructions given to patient/family with verbalized understanding. D/c education completed with patient/family including follow up instructions, medication list, d/c activities limitations if indicated, with other d/c instructions as indicated by MD - patient able to verbalize understanding, all questions fully answered. Patient instructed to return to ED, call 911, or call MD for any changes in condition.  Patient escorted via [place], and D/C home via private auto.  Zenaida Deed, RN 09/08/2019 3:43 PM

## 2019-09-08 NOTE — TOC Progression Note (Signed)
Pt being discharged today. Pt active with [facility] RN prior to admission. Updated MD for [facility] resumption orders. Updated Sarah at [place].

## 2019-09-11 ENCOUNTER — Encounter: Payer: Medicare Other | Attending: Physical Medicine & Rehabilitation | Admitting: Physical Medicine & Rehabilitation

## 2019-09-11 ENCOUNTER — Other Ambulatory Visit: Payer: Self-pay

## 2019-09-11 ENCOUNTER — Encounter: Payer: Self-pay | Admitting: Physical Medicine & Rehabilitation

## 2019-09-11 VITALS — BP 117/67 | HR 59 | Temp 97.9°F | Ht 62.0 in | Wt 145.0 lb

## 2019-09-11 DIAGNOSIS — G8918 Other acute postprocedural pain: Secondary | ICD-10-CM

## 2019-09-11 DIAGNOSIS — E1165 Type 2 diabetes mellitus with hyperglycemia: Secondary | ICD-10-CM | POA: Diagnosis not present

## 2019-09-11 DIAGNOSIS — S78111A Complete traumatic amputation at level between right hip and knee, initial encounter: Secondary | ICD-10-CM

## 2019-09-11 DIAGNOSIS — S78119A Complete traumatic amputation at level between unspecified hip and knee, initial encounter: Secondary | ICD-10-CM | POA: Diagnosis present

## 2019-09-11 DIAGNOSIS — Z794 Long term (current) use of insulin: Secondary | ICD-10-CM | POA: Insufficient documentation

## 2019-09-11 DIAGNOSIS — D696 Thrombocytopenia, unspecified: Secondary | ICD-10-CM | POA: Diagnosis not present

## 2019-09-11 DIAGNOSIS — D649 Anemia, unspecified: Secondary | ICD-10-CM | POA: Diagnosis not present

## 2019-09-11 DIAGNOSIS — G546 Phantom limb syndrome with pain: Secondary | ICD-10-CM | POA: Diagnosis not present

## 2019-09-11 LAB — CULTURE, BLOOD (ROUTINE X 2)
Culture: NO GROWTH
Culture: NO GROWTH
Special Requests: ADEQUATE
Special Requests: ADEQUATE

## 2019-09-11 NOTE — Progress Notes (Signed)
Subjective:    Patient ID: Brittany Russo, female    DOB: Aug 15, 1945, 74 y.o.   MRN: [id number]  HPI Female with history of asthma, T2DM, depression/anxiety, cirrhosis of the liver, GIBs, failed R-TKR complicated by distal femur fracture and infection presents for hospital follow-up after receiving CIR for right AKA and multiple medical issues.  Admit date: 07/04/2019 Discharge date: 07/28/2019  Since discharge, patient presented to the hospital for perceived abdominal distention, UTI, hepatic encephalopathy-notes reviewed.  Family inquired about hepatologist.  Family felt patient's abdomen was becoming distended brought her to the ED.  On the second occasion, family felt that patient had a UTI and waited 2 days so that she could receive IV antibiotics, because they work faster.  Her ammonia medications were also decreased due to concerns of diarrhea.  Daughter supplements history.  Since that time, patient was Hepatologist. She saw PCP. She is following up with Ortho. She notes improving wound and decreasing edema.  She has not followed up with Urology. Pain is controlled. Denies falls. She states CBGs have been controlled. Denies blood in urine.  Therapies: Completes tomorrow DME: Hospital bed, bedside commode, transfer bench Mobility: Wheelchair at all times  Pain Inventory Average Pain 6 Pain Right Now 1 My pain is intermittent, tingling and phantom  In the last 24 hours, has pain interfered with the following? General activity 10 Relation with others 0 Enjoyment of life 8 What TIME of day is your pain at its worst? evening Sleep (in general) Fair  Pain is worse with: unsure Pain improves with: massage and gabapentin Relief from Meds: 3  Mobility ability to climb steps?  no do you drive?  no use a wheelchair needs help with transfers  Function I need assistance with the following:  dressing, bathing, meal prep, household duties and shopping  Neuro/Psych bowel control  problems weakness numbness tingling trouble walking depression anxiety  Prior Studies Any changes since last visit?  yes readmitted to [facility] due to [place]  Physicians involved in your care [place]   Family History  Problem Relation Age of Onset  . Heart disease Mother   . Diabetes Mother   . Heart disease Father   . Cancer Father   . Cancer Maternal Grandmother   . Heart disease Maternal Grandfather    Social History   Socioeconomic History  . Marital status: Married    Spouse name: Not on file  . Number of children: Not on file  . Years of education: Not on file  . Highest education level: Not on file  Occupational History  . Not on file  Tobacco Use  . Smoking status: Never Smoker  . Smokeless tobacco: Never Used  Vaping Use  . Vaping Use: Never used  Substance and Sexual Activity  . Alcohol use: No  . Drug use: No  . Sexual activity: Not Currently  Other Topics Concern  . Not on file  Social History Narrative  . Not on file   Social Determinants of Health   Financial Resource Strain:   . Difficulty of Paying Living Expenses:   Food Insecurity:   . Worried About Charity fundraiser in the Last Year:   . Arboriculturist in the Last Year:   Transportation Needs:   . Film/video editor (Medical):   [name] Lack of Transportation (Non-Medical):   Physical Activity:   . Days of Exercise per Week:   . Minutes of Exercise per Session:  Stress:   . Feeling of Stress :   Social Connections:   . Frequency of Communication with Friends and Family:   . Frequency of Social Gatherings with Friends and Family:   . Attends Religious Services:   . Active Member of Clubs or Organizations:   . Attends Archivist Meetings:   [name] Marital Status:    Past Surgical History:  Procedure Laterality Date  . AMPUTATION Right 06/29/2019   Procedure: AMPUTATION ABOVE KNEE through knee;  Surgeon: Paralee Cancel, MD;  Location: [facility];  Service:  Orthopedics;  Laterality: Right;  2 hrs  . CATARACT EXTRACTION W/PHACO Left 06/04/2017   Procedure: CATARACT EXTRACTION PHACO AND INTRAOCULAR LENS PLACEMENT (IOC);  Surgeon: Baruch Goldmann, MD;  Location: [facility];  Service: Ophthalmology;  Laterality: Left;  CDE: 3.90  . CATARACT EXTRACTION W/PHACO Right 07/15/2017   Procedure: CATARACT EXTRACTION PHACO AND INTRAOCULAR LENS PLACEMENT (IOC);  Surgeon: Baruch Goldmann, MD;  Location: [facility];  Service: Ophthalmology;  Laterality: Right;  CDE: 7.60  . COLONOSCOPY  06/2007   in [place]. for hematochzia.  radiation proctitis  . DILATION AND CURETTAGE OF UTERUS    . ESOPHAGOGASTRODUODENOSCOPY  2016  . ESOPHAGOGASTRODUODENOSCOPY (EGD) WITH PROPOFOL N/A 12/30/2018   Procedure: EGD;  Surgeon: Clarene Essex, MD;  Location: [facility];  Service: Endoscopy;  Laterality: N/A;  . EXCISIONAL TOTAL KNEE ARTHROPLASTY Right 01/03/2019   Procedure: EXCISIONAL TOTAL KNEE ARTHROPLASTY;  Surgeon: Paralee Cancel, MD;  Location: [facility];  Service: Orthopedics;  Laterality: Right;  . FLEXIBLE SIGMOIDOSCOPY N/A 12/30/2018   Procedure: FLEXIBLE SIGMOIDOSCOPY;  Surgeon: Clarene Essex, MD;  Location: [facility];  Service: Endoscopy;  Laterality: N/A;  . IR PARACENTESIS  10/13/2018  . IR PARACENTESIS  07/21/2019  . IR THORACENTESIS ASP PLEURAL SPACE W/IMG GUIDE  07/19/2019  . STERIOD INJECTION Left 06/29/2019   Procedure: LEFT KNEE INJECTION;  Surgeon: Paralee Cancel, MD;  Location: [facility];  Service: Orthopedics;  Laterality: Left;  . TOTAL ABDOMINAL HYSTERECTOMY  1996  . TOTAL KNEE ARTHROPLASTY Right 09/22/2018   Procedure: TOTAL KNEE ARTHROPLASTY;  Surgeon: Paralee Cancel, MD;  Location: [facility];  Service: Orthopedics;  Laterality: Right;  70 mins  . TOTAL KNEE REVISION Right 09/27/2018   Procedure: TOTAL KNEE REVISION;  Surgeon: Paralee Cancel, MD;  Location: [facility];  Service: Orthopedics;  Laterality: Right;  . TOTAL KNEE REVISION Right 11/22/2018   Procedure: repair right knee extensor  mechanism;  Surgeon: Paralee Cancel, MD;  Location: [facility];  Service: Orthopedics;  Laterality: Right;  90 mins   Past Medical History:  Diagnosis Date  . Acute and subacute hepatic failure without coma   . Allergy   . Anasarca   . Anxiety   . Arthritis   . Ascites 09/2018   no SBP on 850 cc tap 10/13/18  . Asthma    allery induced  . Cirrhosis of liver ([place]) ~ 2004   from NAFLD.  Dx ~ 2004.  Dr Dorcas Mcmurray at [facility]/    . Depression   . Diabetes mellitus without complication ([place])    type 2  . Eczema of both hands   . Endometrial cancer ([place]) 1996, 2003   hysterectomy 1996.  recurrence 2003, treated with radiation.    . Generalized edema   . GI hemorrhage   . 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 in [place]  . Hyperlipidemia   . Hypertension   . Muscle weakness   .  Seizures ([place])    last seizure June 07, 2014   . Thrombocytopenia ([place])   . Thyroid nodule    BP 117/67   Pulse (!) 59   Temp 97.9 F (36.6 C)   Ht 5' 2"  (1.575 m)   Wt 145 lb (65.8 kg) Comment: last recorded  SpO2 95%   BMI 26.52 kg/m   Opioid Risk Score:   Fall Risk Score:  `1  Depression screen PHQ 2/9  Depression screen PHQ 2/9 09/11/2019  Decreased Interest 2  Down, Depressed, Hopeless 2  PHQ - 2 Score 4  Altered sleeping 2  Tired, decreased energy 2  Change in appetite 2  Feeling bad or failure about yourself  0  Trouble concentrating 2  Moving slowly or fidgety/restless 2  PHQ-9 Score 14    Review of Systems  Constitutional: Positive for unexpected weight change.  HENT: Negative.   Eyes: Negative.   Respiratory: Negative.   Cardiovascular: Negative.   Gastrointestinal: Positive for abdominal pain, nausea and vomiting.  Endocrine: Negative.   Genitourinary: Negative.   Musculoskeletal: Positive for gait problem.  Skin: Positive for wound.  Allergic/Immunologic: Negative.   Neurological: Positive for weakness and  numbness.       Tingling  Hematological: Negative.   Psychiatric/Behavioral: Positive for dysphoric mood. The patient is nervous/anxious.   All other systems reviewed and are negative.      Objective:   Physical Exam Constitutional: NAD. Vital signs reviewed. Pale HENT: Normocephalic.  Atraumatic. Eyes: EOMI. No discharge. Cardiovascular: No JVD. Respiratory: Normal effort.  No stridor. GI: Mild distended. Skin:  Right AKA healing with edema Psych: Flat Musc: Right AKA Neuro: Alert Motor:  Left lower extremity: Hip flexion, knee extension 4/5, ankle dorsiflexion tight heelcord, but able to dorsiflex, 4-/5 ADF Right lower extremity: Hip flexion 4-4+/5 (some pain inhibition)    Assessment & Plan:  Female with history of asthma, T2DM, depression/anxiety, cirrhosis of the liver, GIBs, failed R-TKR complicated by distal femur fracture and infection presents for hospital follow-up after receiving CIR for right AKA and multiple medical issues.  1.  Impaired function, ADLs and mobility secondary to R AKA due to failed TKRs  Cont therapies after prosthesis  Cont follow up with Ortho  Stump shrinker ordered  2. Phantom Pain   Cont Gabapentin 300 qhs  Wean tramadol  Relatively controlled  3. Cirrhosis of the liver:   Cont follow up with GI  Meds per GI  4. Acute on chronic anemia:   Hb 9.5 on 6/17  Pale appearance, per daughter, no acute changed  5. Chronic Thrombocytopenia:   See #3  6. T2DM with hyperglycemia: Hgb A1c-5.2.   Controlled on 6/21  Cont to monitor  7.  Radiation cystitis  Follow up with Urology  8. Gait abnormality  Cont wheelchair for safety  Therapies after prosthesis

## 2019-09-27 DIAGNOSIS — F419 Anxiety disorder, unspecified: Secondary | ICD-10-CM | POA: Diagnosis not present

## 2019-09-27 DIAGNOSIS — G9341 Metabolic encephalopathy: Secondary | ICD-10-CM | POA: Diagnosis not present

## 2019-09-27 DIAGNOSIS — G546 Phantom limb syndrome with pain: Secondary | ICD-10-CM | POA: Diagnosis not present

## 2019-09-27 DIAGNOSIS — Z8541 Personal history of malignant neoplasm of cervix uteri: Secondary | ICD-10-CM | POA: Diagnosis not present

## 2019-09-27 DIAGNOSIS — Z87440 Personal history of urinary (tract) infections: Secondary | ICD-10-CM | POA: Diagnosis not present

## 2019-09-27 DIAGNOSIS — E1143 Type 2 diabetes mellitus with diabetic autonomic (poly)neuropathy: Secondary | ICD-10-CM | POA: Diagnosis not present

## 2019-09-27 DIAGNOSIS — K7581 Nonalcoholic steatohepatitis (NASH): Secondary | ICD-10-CM | POA: Diagnosis not present

## 2019-09-27 DIAGNOSIS — E871 Hypo-osmolality and hyponatremia: Secondary | ICD-10-CM | POA: Diagnosis not present

## 2019-09-27 DIAGNOSIS — Z79891 Long term (current) use of opiate analgesic: Secondary | ICD-10-CM | POA: Diagnosis not present

## 2019-09-27 DIAGNOSIS — K766 Portal hypertension: Secondary | ICD-10-CM | POA: Diagnosis not present

## 2019-09-27 DIAGNOSIS — Z4781 Encounter for orthopedic aftercare following surgical amputation: Secondary | ICD-10-CM | POA: Diagnosis not present

## 2019-09-27 DIAGNOSIS — I700 Atherosclerosis of aorta: Secondary | ICD-10-CM | POA: Diagnosis not present

## 2019-09-27 DIAGNOSIS — F329 Major depressive disorder, single episode, unspecified: Secondary | ICD-10-CM | POA: Diagnosis not present

## 2019-09-27 DIAGNOSIS — K3184 Gastroparesis: Secondary | ICD-10-CM | POA: Diagnosis not present

## 2019-09-27 DIAGNOSIS — Z794 Long term (current) use of insulin: Secondary | ICD-10-CM | POA: Diagnosis not present

## 2019-09-27 DIAGNOSIS — K3189 Other diseases of stomach and duodenum: Secondary | ICD-10-CM | POA: Diagnosis not present

## 2019-09-27 DIAGNOSIS — Z89611 Acquired absence of right leg above knee: Secondary | ICD-10-CM | POA: Diagnosis not present

## 2019-09-27 DIAGNOSIS — K7200 Acute and subacute hepatic failure without coma: Secondary | ICD-10-CM | POA: Diagnosis not present

## 2019-09-27 DIAGNOSIS — J45909 Unspecified asthma, uncomplicated: Secondary | ICD-10-CM | POA: Diagnosis not present

## 2019-09-28 ENCOUNTER — Other Ambulatory Visit: Payer: Self-pay | Admitting: *Deleted

## 2019-09-28 NOTE — Patient Outreach (Signed)
[facility]) Care Management  09/28/2019  Brittany Russo [birth year] [id number]  Referral for a red flag on Emmi call.  Late entry for call made on 09/25/19, not recorded at that time due to Internet connection issues.  Talked to pt's daughter Brittany Russo. Verified she is pt contact for communications. Advised reason for the call and asked if NP could speak with pt. Pt was sleeping.  Verified that pt has all her medications at this time. Daughter verifies that she does. Also inquired as to her mother's mood and she acknowledges she has a hx of depression and is on 2 meds for this.  NP recommended that they talk with her primary care MD about current mood and possible medication adjustments.  [name] denies needs for her mother at this time. She would like to receive our information for future reference. WIll send successful outreach letter.  [name]. [name], MSN, [facility] Gerontological Nurse Practitioner [facility] Care Management [phone]

## 2019-09-29 DIAGNOSIS — K766 Portal hypertension: Secondary | ICD-10-CM | POA: Diagnosis not present

## 2019-09-29 DIAGNOSIS — Z4781 Encounter for orthopedic aftercare following surgical amputation: Secondary | ICD-10-CM | POA: Diagnosis not present

## 2019-09-29 DIAGNOSIS — K3189 Other diseases of stomach and duodenum: Secondary | ICD-10-CM | POA: Diagnosis not present

## 2019-09-29 DIAGNOSIS — G9341 Metabolic encephalopathy: Secondary | ICD-10-CM | POA: Diagnosis not present

## 2019-09-29 DIAGNOSIS — K7581 Nonalcoholic steatohepatitis (NASH): Secondary | ICD-10-CM | POA: Diagnosis not present

## 2019-09-29 DIAGNOSIS — K7200 Acute and subacute hepatic failure without coma: Secondary | ICD-10-CM | POA: Diagnosis not present

## 2019-10-02 DIAGNOSIS — Z4781 Encounter for orthopedic aftercare following surgical amputation: Secondary | ICD-10-CM | POA: Diagnosis not present

## 2019-10-02 DIAGNOSIS — G9341 Metabolic encephalopathy: Secondary | ICD-10-CM | POA: Diagnosis not present

## 2019-10-02 DIAGNOSIS — K3189 Other diseases of stomach and duodenum: Secondary | ICD-10-CM | POA: Diagnosis not present

## 2019-10-02 DIAGNOSIS — K766 Portal hypertension: Secondary | ICD-10-CM | POA: Diagnosis not present

## 2019-10-02 DIAGNOSIS — K7581 Nonalcoholic steatohepatitis (NASH): Secondary | ICD-10-CM | POA: Diagnosis not present

## 2019-10-02 DIAGNOSIS — K7200 Acute and subacute hepatic failure without coma: Secondary | ICD-10-CM | POA: Diagnosis not present

## 2019-10-03 DIAGNOSIS — K766 Portal hypertension: Secondary | ICD-10-CM | POA: Diagnosis not present

## 2019-10-03 DIAGNOSIS — Z4781 Encounter for orthopedic aftercare following surgical amputation: Secondary | ICD-10-CM | POA: Diagnosis not present

## 2019-10-03 DIAGNOSIS — K3189 Other diseases of stomach and duodenum: Secondary | ICD-10-CM | POA: Diagnosis not present

## 2019-10-03 DIAGNOSIS — G9341 Metabolic encephalopathy: Secondary | ICD-10-CM | POA: Diagnosis not present

## 2019-10-03 DIAGNOSIS — K7581 Nonalcoholic steatohepatitis (NASH): Secondary | ICD-10-CM | POA: Diagnosis not present

## 2019-10-03 DIAGNOSIS — K7200 Acute and subacute hepatic failure without coma: Secondary | ICD-10-CM | POA: Diagnosis not present

## 2019-10-05 DIAGNOSIS — Z4781 Encounter for orthopedic aftercare following surgical amputation: Secondary | ICD-10-CM | POA: Diagnosis not present

## 2019-10-05 DIAGNOSIS — K766 Portal hypertension: Secondary | ICD-10-CM | POA: Diagnosis not present

## 2019-10-05 DIAGNOSIS — G9341 Metabolic encephalopathy: Secondary | ICD-10-CM | POA: Diagnosis not present

## 2019-10-05 DIAGNOSIS — K7200 Acute and subacute hepatic failure without coma: Secondary | ICD-10-CM | POA: Diagnosis not present

## 2019-10-05 DIAGNOSIS — K7581 Nonalcoholic steatohepatitis (NASH): Secondary | ICD-10-CM | POA: Diagnosis not present

## 2019-10-05 DIAGNOSIS — K3189 Other diseases of stomach and duodenum: Secondary | ICD-10-CM | POA: Diagnosis not present

## 2019-10-05 DIAGNOSIS — K7460 Unspecified cirrhosis of liver: Secondary | ICD-10-CM | POA: Diagnosis not present

## 2019-10-08 DIAGNOSIS — S78119A Complete traumatic amputation at level between unspecified hip and knee, initial encounter: Secondary | ICD-10-CM | POA: Diagnosis not present

## 2019-10-08 DIAGNOSIS — K7290 Hepatic failure, unspecified without coma: Secondary | ICD-10-CM | POA: Diagnosis not present

## 2019-10-08 DIAGNOSIS — I10 Essential (primary) hypertension: Secondary | ICD-10-CM | POA: Diagnosis not present

## 2019-10-08 DIAGNOSIS — E1165 Type 2 diabetes mellitus with hyperglycemia: Secondary | ICD-10-CM | POA: Diagnosis not present

## 2019-10-08 DIAGNOSIS — F419 Anxiety disorder, unspecified: Secondary | ICD-10-CM | POA: Diagnosis not present

## 2019-10-08 DIAGNOSIS — K7581 Nonalcoholic steatohepatitis (NASH): Secondary | ICD-10-CM | POA: Diagnosis not present

## 2019-10-08 DIAGNOSIS — G9341 Metabolic encephalopathy: Secondary | ICD-10-CM | POA: Diagnosis not present

## 2019-10-09 ENCOUNTER — Encounter: Payer: Medicare Other | Admitting: Physical Medicine & Rehabilitation

## 2019-10-10 DIAGNOSIS — K3189 Other diseases of stomach and duodenum: Secondary | ICD-10-CM | POA: Diagnosis not present

## 2019-10-10 DIAGNOSIS — Z4781 Encounter for orthopedic aftercare following surgical amputation: Secondary | ICD-10-CM | POA: Diagnosis not present

## 2019-10-10 DIAGNOSIS — K7581 Nonalcoholic steatohepatitis (NASH): Secondary | ICD-10-CM | POA: Diagnosis not present

## 2019-10-10 DIAGNOSIS — K7200 Acute and subacute hepatic failure without coma: Secondary | ICD-10-CM | POA: Diagnosis not present

## 2019-10-10 DIAGNOSIS — K766 Portal hypertension: Secondary | ICD-10-CM | POA: Diagnosis not present

## 2019-10-10 DIAGNOSIS — G9341 Metabolic encephalopathy: Secondary | ICD-10-CM | POA: Diagnosis not present

## 2019-10-11 DIAGNOSIS — K7200 Acute and subacute hepatic failure without coma: Secondary | ICD-10-CM | POA: Diagnosis not present

## 2019-10-11 DIAGNOSIS — K3189 Other diseases of stomach and duodenum: Secondary | ICD-10-CM | POA: Diagnosis not present

## 2019-10-11 DIAGNOSIS — Z4781 Encounter for orthopedic aftercare following surgical amputation: Secondary | ICD-10-CM | POA: Diagnosis not present

## 2019-10-11 DIAGNOSIS — G9341 Metabolic encephalopathy: Secondary | ICD-10-CM | POA: Diagnosis not present

## 2019-10-11 DIAGNOSIS — K766 Portal hypertension: Secondary | ICD-10-CM | POA: Diagnosis not present

## 2019-10-11 DIAGNOSIS — K7581 Nonalcoholic steatohepatitis (NASH): Secondary | ICD-10-CM | POA: Diagnosis not present

## 2019-10-12 DIAGNOSIS — K7200 Acute and subacute hepatic failure without coma: Secondary | ICD-10-CM | POA: Diagnosis not present

## 2019-10-12 DIAGNOSIS — G9341 Metabolic encephalopathy: Secondary | ICD-10-CM | POA: Diagnosis not present

## 2019-10-12 DIAGNOSIS — K7581 Nonalcoholic steatohepatitis (NASH): Secondary | ICD-10-CM | POA: Diagnosis not present

## 2019-10-12 DIAGNOSIS — K766 Portal hypertension: Secondary | ICD-10-CM | POA: Diagnosis not present

## 2019-10-12 DIAGNOSIS — K3189 Other diseases of stomach and duodenum: Secondary | ICD-10-CM | POA: Diagnosis not present

## 2019-10-12 DIAGNOSIS — Z4781 Encounter for orthopedic aftercare following surgical amputation: Secondary | ICD-10-CM | POA: Diagnosis not present

## 2019-10-17 DIAGNOSIS — K766 Portal hypertension: Secondary | ICD-10-CM | POA: Diagnosis not present

## 2019-10-17 DIAGNOSIS — K7200 Acute and subacute hepatic failure without coma: Secondary | ICD-10-CM | POA: Diagnosis not present

## 2019-10-17 DIAGNOSIS — Z4781 Encounter for orthopedic aftercare following surgical amputation: Secondary | ICD-10-CM | POA: Diagnosis not present

## 2019-10-17 DIAGNOSIS — G9341 Metabolic encephalopathy: Secondary | ICD-10-CM | POA: Diagnosis not present

## 2019-10-17 DIAGNOSIS — K7581 Nonalcoholic steatohepatitis (NASH): Secondary | ICD-10-CM | POA: Diagnosis not present

## 2019-10-17 DIAGNOSIS — K3189 Other diseases of stomach and duodenum: Secondary | ICD-10-CM | POA: Diagnosis not present

## 2019-10-18 DIAGNOSIS — K7581 Nonalcoholic steatohepatitis (NASH): Secondary | ICD-10-CM | POA: Diagnosis not present

## 2019-10-18 DIAGNOSIS — Z4781 Encounter for orthopedic aftercare following surgical amputation: Secondary | ICD-10-CM | POA: Diagnosis not present

## 2019-10-18 DIAGNOSIS — K766 Portal hypertension: Secondary | ICD-10-CM | POA: Diagnosis not present

## 2019-10-18 DIAGNOSIS — K7200 Acute and subacute hepatic failure without coma: Secondary | ICD-10-CM | POA: Diagnosis not present

## 2019-10-18 DIAGNOSIS — K3189 Other diseases of stomach and duodenum: Secondary | ICD-10-CM | POA: Diagnosis not present

## 2019-10-18 DIAGNOSIS — G9341 Metabolic encephalopathy: Secondary | ICD-10-CM | POA: Diagnosis not present

## 2019-10-19 DIAGNOSIS — Z89611 Acquired absence of right leg above knee: Secondary | ICD-10-CM | POA: Diagnosis not present

## 2019-10-20 DIAGNOSIS — E7849 Other hyperlipidemia: Secondary | ICD-10-CM | POA: Diagnosis not present

## 2019-10-20 DIAGNOSIS — I10 Essential (primary) hypertension: Secondary | ICD-10-CM | POA: Diagnosis not present

## 2019-10-20 DIAGNOSIS — E1165 Type 2 diabetes mellitus with hyperglycemia: Secondary | ICD-10-CM | POA: Diagnosis not present

## 2019-10-20 DIAGNOSIS — K7581 Nonalcoholic steatohepatitis (NASH): Secondary | ICD-10-CM | POA: Diagnosis not present

## 2019-10-24 DIAGNOSIS — K3189 Other diseases of stomach and duodenum: Secondary | ICD-10-CM | POA: Diagnosis not present

## 2019-10-24 DIAGNOSIS — G9341 Metabolic encephalopathy: Secondary | ICD-10-CM | POA: Diagnosis not present

## 2019-10-24 DIAGNOSIS — Z4781 Encounter for orthopedic aftercare following surgical amputation: Secondary | ICD-10-CM | POA: Diagnosis not present

## 2019-10-24 DIAGNOSIS — K7581 Nonalcoholic steatohepatitis (NASH): Secondary | ICD-10-CM | POA: Diagnosis not present

## 2019-10-24 DIAGNOSIS — K7200 Acute and subacute hepatic failure without coma: Secondary | ICD-10-CM | POA: Diagnosis not present

## 2019-10-24 DIAGNOSIS — K766 Portal hypertension: Secondary | ICD-10-CM | POA: Diagnosis not present

## 2019-10-25 DIAGNOSIS — K7200 Acute and subacute hepatic failure without coma: Secondary | ICD-10-CM | POA: Diagnosis not present

## 2019-10-25 DIAGNOSIS — K3189 Other diseases of stomach and duodenum: Secondary | ICD-10-CM | POA: Diagnosis not present

## 2019-10-25 DIAGNOSIS — K766 Portal hypertension: Secondary | ICD-10-CM | POA: Diagnosis not present

## 2019-10-25 DIAGNOSIS — G9341 Metabolic encephalopathy: Secondary | ICD-10-CM | POA: Diagnosis not present

## 2019-10-25 DIAGNOSIS — K7581 Nonalcoholic steatohepatitis (NASH): Secondary | ICD-10-CM | POA: Diagnosis not present

## 2019-10-25 DIAGNOSIS — Z4781 Encounter for orthopedic aftercare following surgical amputation: Secondary | ICD-10-CM | POA: Diagnosis not present

## 2019-10-26 DIAGNOSIS — K7581 Nonalcoholic steatohepatitis (NASH): Secondary | ICD-10-CM | POA: Diagnosis not present

## 2019-10-26 DIAGNOSIS — K766 Portal hypertension: Secondary | ICD-10-CM | POA: Diagnosis not present

## 2019-10-26 DIAGNOSIS — Z4781 Encounter for orthopedic aftercare following surgical amputation: Secondary | ICD-10-CM | POA: Diagnosis not present

## 2019-10-26 DIAGNOSIS — G9341 Metabolic encephalopathy: Secondary | ICD-10-CM | POA: Diagnosis not present

## 2019-10-26 DIAGNOSIS — K7200 Acute and subacute hepatic failure without coma: Secondary | ICD-10-CM | POA: Diagnosis not present

## 2019-10-26 DIAGNOSIS — K3189 Other diseases of stomach and duodenum: Secondary | ICD-10-CM | POA: Diagnosis not present

## 2019-10-27 DIAGNOSIS — K3184 Gastroparesis: Secondary | ICD-10-CM | POA: Diagnosis not present

## 2019-10-27 DIAGNOSIS — F329 Major depressive disorder, single episode, unspecified: Secondary | ICD-10-CM | POA: Diagnosis not present

## 2019-10-27 DIAGNOSIS — Z794 Long term (current) use of insulin: Secondary | ICD-10-CM | POA: Diagnosis not present

## 2019-10-27 DIAGNOSIS — Z8541 Personal history of malignant neoplasm of cervix uteri: Secondary | ICD-10-CM | POA: Diagnosis not present

## 2019-10-27 DIAGNOSIS — K766 Portal hypertension: Secondary | ICD-10-CM | POA: Diagnosis not present

## 2019-10-27 DIAGNOSIS — G546 Phantom limb syndrome with pain: Secondary | ICD-10-CM | POA: Diagnosis not present

## 2019-10-27 DIAGNOSIS — I700 Atherosclerosis of aorta: Secondary | ICD-10-CM | POA: Diagnosis not present

## 2019-10-27 DIAGNOSIS — E871 Hypo-osmolality and hyponatremia: Secondary | ICD-10-CM | POA: Diagnosis not present

## 2019-10-27 DIAGNOSIS — Z79891 Long term (current) use of opiate analgesic: Secondary | ICD-10-CM | POA: Diagnosis not present

## 2019-10-27 DIAGNOSIS — K3189 Other diseases of stomach and duodenum: Secondary | ICD-10-CM | POA: Diagnosis not present

## 2019-10-27 DIAGNOSIS — J45909 Unspecified asthma, uncomplicated: Secondary | ICD-10-CM | POA: Diagnosis not present

## 2019-10-27 DIAGNOSIS — F419 Anxiety disorder, unspecified: Secondary | ICD-10-CM | POA: Diagnosis not present

## 2019-10-27 DIAGNOSIS — Z87440 Personal history of urinary (tract) infections: Secondary | ICD-10-CM | POA: Diagnosis not present

## 2019-10-27 DIAGNOSIS — K7581 Nonalcoholic steatohepatitis (NASH): Secondary | ICD-10-CM | POA: Diagnosis not present

## 2019-10-27 DIAGNOSIS — E1143 Type 2 diabetes mellitus with diabetic autonomic (poly)neuropathy: Secondary | ICD-10-CM | POA: Diagnosis not present

## 2019-10-27 DIAGNOSIS — G9341 Metabolic encephalopathy: Secondary | ICD-10-CM | POA: Diagnosis not present

## 2019-10-27 DIAGNOSIS — Z4781 Encounter for orthopedic aftercare following surgical amputation: Secondary | ICD-10-CM | POA: Diagnosis not present

## 2019-10-27 DIAGNOSIS — K7200 Acute and subacute hepatic failure without coma: Secondary | ICD-10-CM | POA: Diagnosis not present

## 2019-10-27 DIAGNOSIS — Z89611 Acquired absence of right leg above knee: Secondary | ICD-10-CM | POA: Diagnosis not present

## 2019-10-31 DIAGNOSIS — K7200 Acute and subacute hepatic failure without coma: Secondary | ICD-10-CM | POA: Diagnosis not present

## 2019-10-31 DIAGNOSIS — K7581 Nonalcoholic steatohepatitis (NASH): Secondary | ICD-10-CM | POA: Diagnosis not present

## 2019-10-31 DIAGNOSIS — Z4781 Encounter for orthopedic aftercare following surgical amputation: Secondary | ICD-10-CM | POA: Diagnosis not present

## 2019-10-31 DIAGNOSIS — K766 Portal hypertension: Secondary | ICD-10-CM | POA: Diagnosis not present

## 2019-10-31 DIAGNOSIS — K3189 Other diseases of stomach and duodenum: Secondary | ICD-10-CM | POA: Diagnosis not present

## 2019-10-31 DIAGNOSIS — G9341 Metabolic encephalopathy: Secondary | ICD-10-CM | POA: Diagnosis not present

## 2019-11-02 DIAGNOSIS — K627 Radiation proctitis: Secondary | ICD-10-CM | POA: Diagnosis not present

## 2019-11-02 DIAGNOSIS — K7460 Unspecified cirrhosis of liver: Secondary | ICD-10-CM | POA: Diagnosis not present

## 2019-11-03 DIAGNOSIS — G9341 Metabolic encephalopathy: Secondary | ICD-10-CM | POA: Diagnosis not present

## 2019-11-03 DIAGNOSIS — K3189 Other diseases of stomach and duodenum: Secondary | ICD-10-CM | POA: Diagnosis not present

## 2019-11-03 DIAGNOSIS — K7200 Acute and subacute hepatic failure without coma: Secondary | ICD-10-CM | POA: Diagnosis not present

## 2019-11-03 DIAGNOSIS — Z4781 Encounter for orthopedic aftercare following surgical amputation: Secondary | ICD-10-CM | POA: Diagnosis not present

## 2019-11-03 DIAGNOSIS — K7581 Nonalcoholic steatohepatitis (NASH): Secondary | ICD-10-CM | POA: Diagnosis not present

## 2019-11-03 DIAGNOSIS — K766 Portal hypertension: Secondary | ICD-10-CM | POA: Diagnosis not present

## 2019-11-07 DIAGNOSIS — K7581 Nonalcoholic steatohepatitis (NASH): Secondary | ICD-10-CM | POA: Diagnosis not present

## 2019-11-07 DIAGNOSIS — Z4781 Encounter for orthopedic aftercare following surgical amputation: Secondary | ICD-10-CM | POA: Diagnosis not present

## 2019-11-07 DIAGNOSIS — G9341 Metabolic encephalopathy: Secondary | ICD-10-CM | POA: Diagnosis not present

## 2019-11-07 DIAGNOSIS — K766 Portal hypertension: Secondary | ICD-10-CM | POA: Diagnosis not present

## 2019-11-07 DIAGNOSIS — K7200 Acute and subacute hepatic failure without coma: Secondary | ICD-10-CM | POA: Diagnosis not present

## 2019-11-07 DIAGNOSIS — K3189 Other diseases of stomach and duodenum: Secondary | ICD-10-CM | POA: Diagnosis not present

## 2019-11-14 DIAGNOSIS — K7581 Nonalcoholic steatohepatitis (NASH): Secondary | ICD-10-CM | POA: Diagnosis not present

## 2019-11-14 DIAGNOSIS — Z4781 Encounter for orthopedic aftercare following surgical amputation: Secondary | ICD-10-CM | POA: Diagnosis not present

## 2019-11-14 DIAGNOSIS — G9341 Metabolic encephalopathy: Secondary | ICD-10-CM | POA: Diagnosis not present

## 2019-11-14 DIAGNOSIS — K7200 Acute and subacute hepatic failure without coma: Secondary | ICD-10-CM | POA: Diagnosis not present

## 2019-11-14 DIAGNOSIS — K3189 Other diseases of stomach and duodenum: Secondary | ICD-10-CM | POA: Diagnosis not present

## 2019-11-14 DIAGNOSIS — K766 Portal hypertension: Secondary | ICD-10-CM | POA: Diagnosis not present

## 2019-11-21 DIAGNOSIS — I10 Essential (primary) hypertension: Secondary | ICD-10-CM | POA: Diagnosis not present

## 2019-11-21 DIAGNOSIS — E7849 Other hyperlipidemia: Secondary | ICD-10-CM | POA: Diagnosis not present

## 2019-11-21 DIAGNOSIS — E1165 Type 2 diabetes mellitus with hyperglycemia: Secondary | ICD-10-CM | POA: Diagnosis not present

## 2019-11-21 DIAGNOSIS — K7581 Nonalcoholic steatohepatitis (NASH): Secondary | ICD-10-CM | POA: Diagnosis not present

## 2019-12-21 DIAGNOSIS — E1165 Type 2 diabetes mellitus with hyperglycemia: Secondary | ICD-10-CM | POA: Diagnosis not present

## 2019-12-21 DIAGNOSIS — I10 Essential (primary) hypertension: Secondary | ICD-10-CM | POA: Diagnosis not present

## 2019-12-21 DIAGNOSIS — K7581 Nonalcoholic steatohepatitis (NASH): Secondary | ICD-10-CM | POA: Diagnosis not present

## 2019-12-21 DIAGNOSIS — E7849 Other hyperlipidemia: Secondary | ICD-10-CM | POA: Diagnosis not present

## 2019-12-25 ENCOUNTER — Ambulatory Visit: Payer: Medicare Other | Admitting: Physical Medicine & Rehabilitation

## 2020-01-08 ENCOUNTER — Other Ambulatory Visit: Payer: Self-pay

## 2020-01-08 ENCOUNTER — Inpatient Hospital Stay (HOSPITAL_COMMUNITY)
Admission: EM | Admit: 2020-01-08 | Discharge: 2020-01-12 | DRG: 389 | Disposition: A | Discharge status: Other Acute Inpatient Hospital | Payer: Medicare Other | Attending: Internal Medicine | Admitting: Internal Medicine

## 2020-01-08 ENCOUNTER — Emergency Department (HOSPITAL_COMMUNITY): Payer: Medicare Other

## 2020-01-08 ENCOUNTER — Encounter (HOSPITAL_COMMUNITY): Payer: Self-pay | Admitting: Emergency Medicine

## 2020-01-08 DIAGNOSIS — R0689 Other abnormalities of breathing: Secondary | ICD-10-CM | POA: Diagnosis not present

## 2020-01-08 DIAGNOSIS — R079 Chest pain, unspecified: Secondary | ICD-10-CM | POA: Diagnosis not present

## 2020-01-08 DIAGNOSIS — Z20822 Contact with and (suspected) exposure to covid-19: Secondary | ICD-10-CM | POA: Diagnosis present

## 2020-01-08 DIAGNOSIS — K565 Intestinal adhesions [bands], unspecified as to partial versus complete obstruction: Secondary | ICD-10-CM | POA: Diagnosis not present

## 2020-01-08 DIAGNOSIS — J90 Pleural effusion, not elsewhere classified: Secondary | ICD-10-CM | POA: Diagnosis not present

## 2020-01-08 DIAGNOSIS — Z89611 Acquired absence of right leg above knee: Secondary | ICD-10-CM

## 2020-01-08 DIAGNOSIS — K56609 Unspecified intestinal obstruction, unspecified as to partial versus complete obstruction: Secondary | ICD-10-CM

## 2020-01-08 DIAGNOSIS — Z96651 Presence of right artificial knee joint: Secondary | ICD-10-CM

## 2020-01-08 DIAGNOSIS — E785 Hyperlipidemia, unspecified: Secondary | ICD-10-CM | POA: Diagnosis present

## 2020-01-08 DIAGNOSIS — Z8542 Personal history of malignant neoplasm of other parts of uterus: Secondary | ICD-10-CM

## 2020-01-08 DIAGNOSIS — E1165 Type 2 diabetes mellitus with hyperglycemia: Secondary | ICD-10-CM | POA: Diagnosis present

## 2020-01-08 DIAGNOSIS — I10 Essential (primary) hypertension: Secondary | ICD-10-CM | POA: Diagnosis present

## 2020-01-08 DIAGNOSIS — J9811 Atelectasis: Secondary | ICD-10-CM | POA: Diagnosis not present

## 2020-01-08 DIAGNOSIS — D6959 Other secondary thrombocytopenia: Secondary | ICD-10-CM | POA: Diagnosis present

## 2020-01-08 DIAGNOSIS — R110 Nausea: Secondary | ICD-10-CM | POA: Diagnosis not present

## 2020-01-08 DIAGNOSIS — R1111 Vomiting without nausea: Secondary | ICD-10-CM | POA: Diagnosis not present

## 2020-01-08 DIAGNOSIS — R52 Pain, unspecified: Secondary | ICD-10-CM | POA: Diagnosis not present

## 2020-01-08 DIAGNOSIS — K7460 Unspecified cirrhosis of liver: Secondary | ICD-10-CM | POA: Diagnosis present

## 2020-01-08 DIAGNOSIS — Z79899 Other long term (current) drug therapy: Secondary | ICD-10-CM

## 2020-01-08 DIAGNOSIS — D696 Thrombocytopenia, unspecified: Secondary | ICD-10-CM | POA: Diagnosis present

## 2020-01-08 DIAGNOSIS — R300 Dysuria: Secondary | ICD-10-CM | POA: Diagnosis not present

## 2020-01-08 DIAGNOSIS — E870 Hyperosmolality and hypernatremia: Secondary | ICD-10-CM | POA: Diagnosis not present

## 2020-01-08 DIAGNOSIS — R1084 Generalized abdominal pain: Secondary | ICD-10-CM | POA: Diagnosis not present

## 2020-01-08 DIAGNOSIS — R1110 Vomiting, unspecified: Secondary | ICD-10-CM | POA: Diagnosis not present

## 2020-01-08 DIAGNOSIS — Z923 Personal history of irradiation: Secondary | ICD-10-CM

## 2020-01-08 DIAGNOSIS — E871 Hypo-osmolality and hyponatremia: Secondary | ICD-10-CM | POA: Diagnosis not present

## 2020-01-08 DIAGNOSIS — Z4659 Encounter for fitting and adjustment of other gastrointestinal appliance and device: Secondary | ICD-10-CM

## 2020-01-08 DIAGNOSIS — E119 Type 2 diabetes mellitus without complications: Secondary | ICD-10-CM

## 2020-01-08 DIAGNOSIS — F32A Depression, unspecified: Secondary | ICD-10-CM | POA: Diagnosis present

## 2020-01-08 DIAGNOSIS — Z794 Long term (current) use of insulin: Secondary | ICD-10-CM

## 2020-01-08 DIAGNOSIS — Z90710 Acquired absence of both cervix and uterus: Secondary | ICD-10-CM

## 2020-01-08 DIAGNOSIS — K7581 Nonalcoholic steatohepatitis (NASH): Secondary | ICD-10-CM | POA: Diagnosis present

## 2020-01-08 LAB — URINALYSIS, ROUTINE W REFLEX MICROSCOPIC
Bilirubin Urine: NEGATIVE
Glucose, UA: NEGATIVE mg/dL
Ketones, ur: NEGATIVE mg/dL
Leukocytes,Ua: NEGATIVE
Nitrite: NEGATIVE
Protein, ur: 30 mg/dL — AB
Specific Gravity, Urine: 1.026 (ref 1.005–1.030)
pH: 6 (ref 5.0–8.0)

## 2020-01-08 LAB — CBC WITH DIFFERENTIAL/PLATELET
Abs Immature Granulocytes: 0.01 10*3/uL (ref 0.00–0.07)
Basophils Absolute: 0 10*3/uL (ref 0.0–0.1)
Basophils Relative: 1 %
Eosinophils Absolute: 0 10*3/uL (ref 0.0–0.5)
Eosinophils Relative: 0 %
HCT: 39 % (ref 36.0–46.0)
Hemoglobin: 13.4 g/dL (ref 12.0–15.0)
Immature Granulocytes: 0 %
Lymphocytes Relative: 12 %
Lymphs Abs: 0.7 10*3/uL (ref 0.7–4.0)
MCH: 31.4 pg (ref 26.0–34.0)
MCHC: 34.4 g/dL (ref 30.0–36.0)
MCV: 91.3 fL (ref 80.0–100.0)
Monocytes Absolute: 0.6 10*3/uL (ref 0.1–1.0)
Monocytes Relative: 10 %
Neutro Abs: 4.3 10*3/uL (ref 1.7–7.7)
Neutrophils Relative %: 77 %
Platelets: 40 10*3/uL — ABNORMAL LOW (ref 150–400)
RBC: 4.27 MIL/uL (ref 3.87–5.11)
RDW: 16.1 % — ABNORMAL HIGH (ref 11.5–15.5)
WBC: 5.6 10*3/uL (ref 4.0–10.5)
nRBC: 0 % (ref 0.0–0.2)

## 2020-01-08 LAB — LIPASE, BLOOD: Lipase: 55 U/L — ABNORMAL HIGH (ref 11–51)

## 2020-01-08 LAB — COMPREHENSIVE METABOLIC PANEL
ALT: 105 U/L — ABNORMAL HIGH (ref 0–44)
AST: 148 U/L — ABNORMAL HIGH (ref 15–41)
Albumin: 3 g/dL — ABNORMAL LOW (ref 3.5–5.0)
Alkaline Phosphatase: 94 U/L (ref 38–126)
Anion gap: 12 (ref 5–15)
BUN: 23 mg/dL (ref 8–23)
CO2: 24 mmol/L (ref 22–32)
Calcium: 9.5 mg/dL (ref 8.9–10.3)
Chloride: 90 mmol/L — ABNORMAL LOW (ref 98–111)
Creatinine, Ser: 0.99 mg/dL (ref 0.44–1.00)
GFR, Estimated: 57 mL/min — ABNORMAL LOW (ref >60)
Glucose, Bld: 208 mg/dL — ABNORMAL HIGH (ref 70–99)
Potassium: 4.2 mmol/L (ref 3.5–5.1)
Sodium: 126 mmol/L — ABNORMAL LOW (ref 135–145)
Total Bilirubin: 2 mg/dL — ABNORMAL HIGH (ref 0.3–1.2)
Total Protein: 6 g/dL — ABNORMAL LOW (ref 6.5–8.1)

## 2020-01-08 LAB — RESP PANEL BY RT PCR (RSV, FLU A&B, COVID)
Influenza A by PCR: NEGATIVE
Influenza B by PCR: NEGATIVE
Respiratory Syncytial Virus by PCR: NEGATIVE
SARS Coronavirus 2 by RT PCR: NEGATIVE

## 2020-01-08 LAB — MAGNESIUM: Magnesium: 2 mg/dL (ref 1.7–2.4)

## 2020-01-08 LAB — TROPONIN I (HIGH SENSITIVITY)
Troponin I (High Sensitivity): 13 ng/L (ref <18)
Troponin I (High Sensitivity): 13 ng/L (ref <18)

## 2020-01-08 LAB — SAMPLE TO BLOOD BANK

## 2020-01-08 LAB — POC OCCULT BLOOD, ED: Fecal Occult Bld: NEGATIVE

## 2020-01-08 LAB — CBG MONITORING, ED: Glucose-Capillary: 142 mg/dL — ABNORMAL HIGH (ref 70–99)

## 2020-01-08 MED ORDER — ONDANSETRON HCL 4 MG/2ML IJ SOLN
4.0000 mg | Freq: Once | INTRAMUSCULAR | Status: AC
  Administered 2020-01-08: 4 mg via INTRAVENOUS
  Filled 2020-01-08: qty 2

## 2020-01-08 MED ORDER — PROMETHAZINE HCL 25 MG/ML IJ SOLN
12.5000 mg | Freq: Three times a day (TID) | INTRAMUSCULAR | Status: DC | PRN
  Administered 2020-01-08 – 2020-01-10 (×4): 12.5 mg via INTRAVENOUS
  Filled 2020-01-08 (×4): qty 1

## 2020-01-08 MED ORDER — HYDRALAZINE HCL 20 MG/ML IJ SOLN: 10.0000 mg | INTRAMUSCULAR | Status: DC | PRN

## 2020-01-08 MED ORDER — INSULIN ASPART 100 UNIT/ML ~~LOC~~ SOLN
0.0000 [IU] | Freq: Four times a day (QID) | SUBCUTANEOUS | Status: DC
  Administered 2020-01-08: 1 [IU] via SUBCUTANEOUS
  Administered 2020-01-09: 2 [IU] via SUBCUTANEOUS
  Administered 2020-01-09 – 2020-01-10 (×3): 1 [IU] via SUBCUTANEOUS
  Administered 2020-01-11: 2 [IU] via SUBCUTANEOUS
  Filled 2020-01-08: qty 1

## 2020-01-08 MED ORDER — SODIUM CHLORIDE 0.9 % IV BOLUS
500.0000 mL | Freq: Once | INTRAVENOUS | Status: AC
  Administered 2020-01-08: 500 mL via INTRAVENOUS

## 2020-01-08 MED ORDER — ACETAMINOPHEN 325 MG PO TABS
650.0000 mg | ORAL_TABLET | Freq: Four times a day (QID) | ORAL | Status: DC | PRN
  Administered 2020-01-08: 650 mg via ORAL
  Filled 2020-01-08: qty 2

## 2020-01-08 MED ORDER — IOHEXOL 300 MG/ML  SOLN
100.0000 mL | Freq: Once | INTRAMUSCULAR | Status: AC | PRN
  Administered 2020-01-08: 100 mL via INTRAVENOUS

## 2020-01-08 MED ORDER — ACETAMINOPHEN 650 MG RE SUPP: 650.0000 mg | Freq: Four times a day (QID) | RECTAL | Status: DC | PRN

## 2020-01-08 MED ORDER — SODIUM CHLORIDE 0.9 % IV SOLN: INTRAVENOUS | Status: DC

## 2020-01-08 NOTE — ED Triage Notes (Signed)
Pt reports was diagnosed with uti yesterday. Pt reports increase in abdominal pain, n/v/d since last night. Pt alert and oriented and reports generalized abdominal pain. Pt denies any known fever or covid exposure.

## 2020-01-08 NOTE — ED Notes (Signed)
Pt in CT at this time.

## 2020-01-08 NOTE — H&P (Addendum)
History and Physical    Brittany Russo MBT:[id number] DOB: 02-15-1946 DOA: 01/08/2020  PCP: Rosalee Kaufman, PA-C   Patient coming from: Home  I have personally briefly reviewed patient's old medical records in [place]  Chief Complaint: Vomiting, loose stools, abdominal pain  HPI: Brittany Russo is a 74 y.o. female with medical history significant for hypertension, diabetes mellitus, liver cirrhosis and hepatic encephalopathy, seizures, right above-knee amputation. Patient presented to the ED nausea vomiting and loose stools over the past few days.  Patient had multiple episodes of vomiting, and loose stools.  She is normally on lactulose for hepatic encephalopathy, but she hasn't been able to take it for about 2 days now due to vomiting.  She also had abdominal pain that started in her lower abdomen and progressed upwards towards her chest.  Daughter was able to take a urine sample from home to primary care provider to evaluate, and they were told that patient had a UTI, they were told urine cultures were ordered, she hadn't been started on antibiotics yet  ED Course:.  Stable vitals.  Sodium 126.  Lipase 55.  WBC 5.6.  Creatinine normal at 0.99.  Troponin 13x2.  UA with rare bacteria.  Chest x-ray negative for active disease.  CT abdomen and pelvis with contrast-findings concerning for mid to distal small bowel obstruction.  Exact transition not visualized. EDP talked to general surgeon on-call Dr. Constance Haw, recommended admission, did not feel strongly about NG tube at this time, but to place if patient's symptoms worsens.  Review of Systems: As per HPI all other systems reviewed and negative.  Past Medical History:  Diagnosis Date  . Acute and subacute hepatic failure without coma   . Allergy   . Anasarca   . Anxiety   . Arthritis   . Ascites 09/2018   no SBP on 850 cc tap 10/13/18  . Asthma    allery induced  . Cirrhosis of liver ([place]) ~ 2004   from NAFLD.  Dx ~  2004.  Dr Dorcas Mcmurray at [facility]/    . Depression   . Diabetes mellitus without complication ([place])    type 2  . Eczema of both hands   . Endometrial cancer ([place]) 1996, 2003   hysterectomy 1996.  recurrence 2003, treated with radiation.    . Generalized edema   . GI hemorrhage   . 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 in [place]  . Hyperlipidemia   . Hypertension   . Muscle weakness   . Seizures ([place])    last seizure June 07, 2014   . Thrombocytopenia ([place])   . Thyroid nodule     Past Surgical History:  Procedure Laterality Date  . AMPUTATION Right 06/29/2019   Procedure: AMPUTATION ABOVE KNEE through knee;  Surgeon: Paralee Cancel, MD;  Location: [facility];  Service: Orthopedics;  Laterality: Right;  2 hrs  . CATARACT EXTRACTION W/PHACO Left 06/04/2017   Procedure: CATARACT EXTRACTION PHACO AND INTRAOCULAR LENS PLACEMENT (IOC);  Surgeon: Baruch Goldmann, MD;  Location: [facility];  Service: Ophthalmology;  Laterality: Left;  CDE: 3.90  . CATARACT EXTRACTION W/PHACO Right 07/15/2017   Procedure: CATARACT EXTRACTION PHACO AND INTRAOCULAR LENS PLACEMENT (IOC);  Surgeon: Baruch Goldmann, MD;  Location: [facility];  Service: Ophthalmology;  Laterality: Right;  CDE: 7.60  . COLONOSCOPY  06/2007   in [place] for hematochzia.  radiation proctitis  . DILATION AND CURETTAGE OF UTERUS    .  ESOPHAGOGASTRODUODENOSCOPY  2016  . ESOPHAGOGASTRODUODENOSCOPY (EGD) WITH PROPOFOL N/A 12/30/2018   Procedure: EGD;  Surgeon: Clarene Essex, MD;  [facility]: [facility];  Service: Endoscopy;  Laterality: N/A;  . EXCISIONAL TOTAL KNEE ARTHROPLASTY Right 01/03/2019   Procedure: EXCISIONAL TOTAL KNEE ARTHROPLASTY;  Surgeon: Paralee Cancel, MD;  [facility]: [facility];  Service: Orthopedics;  Laterality: Right;  . FLEXIBLE SIGMOIDOSCOPY N/A 12/30/2018   Procedure: FLEXIBLE SIGMOIDOSCOPY;  Surgeon: Clarene Essex, MD;  [facility];  Service: Endoscopy;  Laterality: N/A;    . IR PARACENTESIS  10/13/2018  . IR PARACENTESIS  07/21/2019  . IR THORACENTESIS ASP PLEURAL SPACE W/IMG GUIDE  07/19/2019  . STERIOD INJECTION Left 06/29/2019   Procedure: LEFT KNEE INJECTION;  Surgeon: Paralee Cancel, MD;  [facility]: [facility];  Service: Orthopedics;  Laterality: Left;  . TOTAL ABDOMINAL HYSTERECTOMY  1996  . TOTAL KNEE ARTHROPLASTY Right 09/22/2018   Procedure: TOTAL KNEE ARTHROPLASTY;  Surgeon: Paralee Cancel, MD;  [facility];  Service: Orthopedics;  Laterality: Right;  70 mins  . TOTAL KNEE REVISION Right 09/27/2018   Procedure: TOTAL KNEE REVISION;  Surgeon: Paralee Cancel, MD;  Location[facility];  Service: Orthopedics;  Laterality: Right;  . TOTAL KNEE REVISION Right 11/22/2018   Procedure: repair right knee extensor mechanism;  Surgeon: Paralee Cancel, MD;  Location: [facility];  Service: Orthopedics;  Laterality: Right;  90 mins     reports that she has never smoked. She has never used smokeless tobacco. She reports that she does not drink alcohol and does not use drugs.  Allergies  Allergen Reactions  . Asa [Aspirin] Other (See Comments)    Pt not allergic but cannot take due to bleeding   . Other Other (See Comments)    Any jewelry metal-hives and itching     Family History  Problem Relation Age of Onset  . Heart disease Mother   . Diabetes Mother   . Heart disease Father   . Cancer Father   . Cancer Maternal Grandmother   . Heart disease Maternal Grandfather     Prior to Admission medications   Medication Sig Start Date End Date Taking? Authorizing Provider  acetaminophen (TYLENOL) 325 MG tablet Take 1-2 tablets (325-650 mg total) by mouth every 4 (four) hours as needed for mild pain. 07/13/19  Yes Love, Ivan Anchors, PA-C  ALPRAZolam (XANAX) 0.25 MG tablet Take 1 tablet (0.25 mg total) by mouth 2 (two) times daily. 01/06/19  Yes Mariel Aloe, MD  atenolol (TENORMIN) 25 MG tablet Take 2 tablets (50 mg total) by mouth every morning. 09/08/19  Yes Tat, Shanon Brow, MD   atorvastatin (LIPITOR) 80 MG tablet Take 80 mg by mouth daily at 8 pm.   Yes [provider]  buPROPion (WELLBUTRIN XL) 150 MG 24 hr tablet Take 150 mg by mouth in the morning.    Yes [provider]  ENULOSE 10 GM/15ML SOLN Take 30 g by mouth daily.  12/26/19  Yes [provider]  escitalopram (LEXAPRO) 20 MG tablet Take 20 mg by mouth in the morning.  05/17/19  Yes [provider]  ferrous sulfate (FERROUSUL) 325 (65 FE) MG tablet Take 1 tablet (325 mg total) by mouth 3 (three) times daily with meals. Patient taking differently: Take 325 mg by mouth 2 (two) times daily with a meal.  07/27/19 09/10/20 Yes Love, Ivan Anchors, PA-C  fluticasone (FLONASE) 50 MCG/ACT nasal spray Place 1 spray into both nostrils in the morning.    Yes [provider]  furosemide (LASIX) 20 MG tablet Take 1 tablet (20 mg total) by mouth 2 (two) times daily. 07/27/19  Yes Love, Ivan Anchors, PA-C  gabapentin (NEURONTIN) 300 MG capsule Take 1 capsule (300 mg total) by mouth 2 (two) times daily. Patient taking differently: Take 300 mg by mouth 2 (two) times daily as needed (for nerve pain).  07/27/19  Yes Love, Ivan Anchors, PA-C  Insulin Glargine (BASAGLAR KWIKPEN) 100 UNIT/ML Inject 25 Units into the skin See admin instructions. Inject 25 units if sugar is mid 100's.   Yes [provider]  lactulose (CHRONULAC) 10 GM/15ML solution Take 22.5 mLs (15 g total) by mouth 2 (two) times daily. Patient taking differently: Take 30 g by mouth daily.  07/27/19  Yes Love, Ivan Anchors, PA-C  loratadine (CLARITIN) 10 MG tablet Take 10 mg by mouth in the morning.    Yes [provider]  mesalamine (CANASA) 1000 MG suppository Place 1 suppository rectally at bedtime.   Yes [provider]  Multiple Vitamin (MULTIVITAMIN WITH MINERALS) TABS tablet Take 1 tablet by mouth daily. 12/06/18  Yes Babish, Rodman Key, PA-C  pantoprazole (PROTONIX) 40 MG tablet Take 1 tablet (40 mg total) by mouth daily.  09/08/19  Yes Tat, Shanon Brow, MD  potassium chloride (KLOR-CON) 10 MEQ tablet Take 1 tablet (10 mEq total) by mouth daily. Patient taking differently: Take 10 mEq by mouth 2 (two) times daily.  07/27/19  Yes Love, Ivan Anchors, PA-C  rifaximin (XIFAXAN) 550 MG TABS tablet Take 550 mg by mouth 2 (two) times daily.   Yes [provider]  sitaGLIPtin-metformin (JANUMET) 50-500 MG per tablet Take 1 tablet by mouth 2 (two) times daily with a meal.   Yes [provider]  spironolactone (ALDACTONE) 100 MG tablet Take 1 tablet (100 mg total) by mouth daily. Patient taking differently: Take 100 mg by mouth in the morning.  07/28/19  Yes Love, Ivan Anchors, PA-C  traZODone (DESYREL) 50 MG tablet Take 50 mg by mouth at bedtime as needed for sleep.  05/17/19  Yes [provider]  insulin aspart (NOVOLOG FLEXPEN) 100 UNIT/ML FlexPen Inject 4 Units into the skin in the morning and at bedtime. Can be up to 8 units twice a day depending on blood sugar. Take mid afternoon only if bloodsugar  is over 180  Takes in evening if it is 200 or over give up to 10 units per daughter    [provider]  lip balm (CARMEX) ointment Apply topically as needed for lip care. Patient not taking: Reported on 01/08/2020 07/13/19   Love, Ivan Anchors, PA-C  traMADol (ULTRAM) 50 MG tablet Take 1 tablet (50 mg total) by mouth every 6 (six) hours as needed for moderate pain. Patient not taking: Reported on 01/08/2020 07/27/19   Love, Ivan Anchors, PA-C  vitamin B-12 (CYANOCOBALAMIN) 500 MCG tablet Take 1 tablet (500 mcg total) by mouth daily. Patient not taking: Reported on 01/08/2020 09/08/19   Tat, Shanon Brow, MD  Ibuprofen-Diphenhydramine Cit (ADVIL PM PO) Take 60 mg by mouth at bedtime.  05/21/17  [provider]    Physical Exam: Vitals:   01/08/20 1830 01/08/20 2030 01/08/20 2100 01/08/20 2200  BP: (!) 130/52 (!) 115/57 (!) 118/57 (!) 124/57  Pulse: 64 62 64 65  Resp: 15 11 (!) 22 (!) 9  Temp:      TempSrc:        SpO2: 96% 90% 92% 92%  Weight:      Height:  Constitutional: Appears weak, Vitals:   01/08/20 1830 01/08/20 2030 01/08/20 2100 01/08/20 2200  BP: (!) 130/52 (!) 115/57 (!) 118/57 (!) 124/57  Pulse: 64 62 64 65  Resp: 15 11 (!) 22 (!) 9  Temp:      TempSrc:      SpO2: 96% 90% 92% 92%  Weight:      Height:       Eyes: PERRL, lids and conjunctivae normal ENMT: Mucous membranes are moist Neck: normal, supple, no masses, no thyromegaly Respiratory: clear to auscultation bilaterally, no wheezing, no crackles. Normal respiratory effort. No accessory muscle use.  Cardiovascular: Regular rate and rhythm, no murmurs / rubs / gallops. No extremity edema. 2+ pedal pulses. No carotid bruits.  Abdomen: Moderate diffuse abdominal tenderness, abdomen soft, not distended, no masses palpated. No hepatosplenomegaly. Musculoskeletal: no clubbing / cyanosis.  Right AKA.  Good ROM, no contractures. Normal muscle tone.  Skin: no rashes, lesions, ulcers. No induration Neurologic: No apparent cranial nerve abnormality, moving extremities spontaneously.  Psychiatric: Normal judgment and insight. Alert and oriented x to person and place, able to tell me the year is 2021 and why she is in the hospital,, but unable to tell me the month.. Normal mood.   Labs on Admission: I have personally reviewed following labs and imaging studies  CBC: Recent Labs  Lab 01/08/20 1633  WBC 5.6  NEUTROABS 4.3  HGB 13.4  HCT 39.0  MCV 91.3  PLT 40*   Basic Metabolic Panel: Recent Labs  Lab 01/08/20 1633  NA 126*  K 4.2  CL 90*  CO2 24  GLUCOSE 208*  BUN 23  CREATININE 0.99  CALCIUM 9.5   Liver Function Tests: Recent Labs  Lab 01/08/20 1633  AST 148*  ALT 105*  ALKPHOS 94  BILITOT 2.0*  PROT 6.0*  ALBUMIN 3.0*   Recent Labs  Lab 01/08/20 1633  LIPASE 55*   Urine analysis:    Component Value Date/Time   COLORURINE YELLOW 01/08/2020 1617   APPEARANCEUR HAZY (A) 01/08/2020 1617    LABSPEC 1.026 01/08/2020 1617   PHURINE 6.0 01/08/2020 1617   GLUCOSEU NEGATIVE 01/08/2020 1617   HGBUR SMALL (A) 01/08/2020 1617   BILIRUBINUR NEGATIVE 01/08/2020 1617   BILIRUBINUR NEGATIVE 03/06/2013 1100   KETONESUR NEGATIVE 01/08/2020 1617   PROTEINUR 30 (A) 01/08/2020 1617   UROBILINOGEN negative 03/06/2013 1100   NITRITE NEGATIVE 01/08/2020 1617   LEUKOCYTESUR NEGATIVE 01/08/2020 1617    Radiological Exams on Admission: CT ABDOMEN PELVIS W CONTRAST  Result Date: 01/08/2020 CLINICAL DATA:  Recent diagnosis of UTI. Increasing abdominal pain, nausea, vomiting EXAM: CT ABDOMEN AND PELVIS WITH CONTRAST TECHNIQUE: Multidetector CT imaging of the abdomen and pelvis was performed using the standard protocol following bolus administration of intravenous contrast. CONTRAST:  163m OMNIPAQUE IOHEXOL 300 MG/ML  SOLN COMPARISON:  09/07/2019 FINDINGS: Lower chest: Cardiomegaly. Coronary artery and aortic atherosclerosis. Trace right pleural effusion. No confluent opacities. Hepatobiliary: Nodular contour of the liver compatible with cirrhosis. Recanalization of the umbilical vein and spontaneous left splenorenal shunt compatible with portal venous hypertension. No focal hepatic abnormality. Gallbladder unremarkable. Pancreas: No focal abnormality or ductal dilatation. Spleen: Spleen is upper limits and normal in size with a craniocaudal length of 12.7 cm. Scattered calcifications compatible with old granulomatous disease. Adrenals/Urinary Tract: No adrenal abnormality. No focal renal abnormality. No stones or hydronephrosis. Urinary bladder is unremarkable. Stomach/Bowel: Appendix is slightly prominent 8 8 mm but no surrounding inflammation. Large bowel is decompressed and grossly unremarkable. There are  dilated small bowel loops with air-fluid levels. Distal small bowel is decompressed. Findings concerning for distal small bowel obstruction. Exact transition or cause not visualized. Vascular/Lymphatic:  Aortic atherosclerosis. No evidence of aneurysm or adenopathy. Reproductive: Prior hysterectomy.  No adnexal masses. Other: Small amount of free fluid adjacent to the liver and in the cul-de-sac. Musculoskeletal: No acute bony abnormality. IMPRESSION: Changes of cirrhosis with evidence of portal venous hypertension. Borderline spleen size. Small amount of ascites. Dilated proximal and mid small bowel loops. Distal small bowel is decompressed. Findings concerning for mid to distal small bowel obstruction. Exact transition not visualized. Aortic atherosclerosis. Electronically Signed   By: Rolm Baptise M.D.   On: 01/08/2020 18:47   DG Chest Port 1 View  Result Date: 01/08/2020 CLINICAL DATA:  Chest pain EXAM: PORTABLE CHEST 1 VIEW COMPARISON:  09/06/2019 FINDINGS: No focal opacity or pleural effusion. Stable enlarged cardiomediastinal silhouette with aortic atherosclerosis. No pneumothorax. Streaky atelectasis left base. IMPRESSION: No active disease. Electronically Signed   By: Donavan Foil M.D.   On: 01/08/2020 17:22    EKG: Independently reviewed.  Sinus rhythm, QTC prolonged 528.  Diffuse T wave abnormalities, no significant change from prior.  Assessment/Plan Principal Problem:   SBO (small bowel obstruction) ([facility]) Active Problems:   DM2 (diabetes mellitus, type 2) ([place])   Hypertension   S/P right TKA   Depression   Cirrhosis ([place])   Controlled type 2 diabetes mellitus with hyperglycemia, with long-term current use of insulin ([facility])   Thrombocytopenia ([facility])   Small bowel obstruction-multiple episodes of vomiting, some episodes of loose stools likely postobstructive diarrhea.  No vomiting in several hours, abdomen not distended.  CT with findings concerning for mid to distal small bowel obstruction.  Exact transition not visualized.  UA today not suggestive of infection (possibly UA collected yesterday and sent to PCP was not appropriately corrected hence dirty sample) - EDP talked to  general surgeon on-call Dr. Constance Haw, place NG tube if symptoms worsen -N.p.o. for now, allow for bowel rest - 561ms bolus given, continue N/s 75cc/hr -BMP in the morning -Phenergan as needed  Hyponatremia-126, likely from vomiting, diarrhea.  Baseline mid 130s.   -IV fluids  Liver cirrhosis with history of hepatic encephalopathy-at this time the patient appears weak she is alert and oriented.  She is on lactulose and rifaximin and has not gotten her medications in at least 2 days. -Hold home medications for now, if bowel issues not resolved soon consider lactulose enema.  Thrombocytopenia-platelets 40, baseline 60s to 70s.  Likely due to liver cirrhosis.  -Hold pharmacologic DVT prophylaxis, -CBC in the morning  Prolonged QTC-528.  Potassium 4.2.  Patient is on bupropion -Check magnesium.  Diabetes mellitus-random glucose 208.  Reports compliance with Lantus 25 units in the morning. - Hgba1c - SSI Q6H -Hold home Lantus, Janumet.  HTN- stable. -Hold home atenolol, Lasix, spironolactone,  Depression-patient takes 0.25 mg of Xanax twice daily. -Hold low-dose Xanax, bupropion, trazodone for now while n.p.o.  DVT prophylaxis: SCDs Code Status: Full code. Family Communication: Talked to patient's daughter MThreasa Beardsat bedside.  All questions answered. Disposition Plan: ~ 2 days Consults called: General surgery. Admission status: Observation, MedSurg.   EBethena RoysMD [facility]  01/08/2020, 11:05 PM

## 2020-01-08 NOTE — ED Provider Notes (Signed)
[facility] Provider Note   CSN: [id number] Arrival date & time: 01/08/20  1511     History Chief Complaint  Patient presents with  . Abdominal Pain    Brittany Russo is a 74 y.o. female with a history as outlined below, most significant for history of liver cirrhosis with episodes of anasarca, diabetes,, hypertension, hyperlipidemia, presenting with a greater than 24-hour history of abdominal pain, chest pain, nausea, vomiting and diarrhea.  She states her symptoms started yesterday with suprapubic pain and increased urinary frequency.  A family member transported a urine sample to the PCP who stated she has a UTI but has not started on antibiotics yet.  Her symptoms have progressed to now include generalized abdominal pain and radiation of pain into her chest.  She denies shortness of breath, no fevers or chills.  She endorses that she has seen blood in her vomit and in her stool.  She has had too numerous to count episodes of emesis, she has had no treatment prior to arrival.  She denies cough, shortness of breath, fevers.  She also denies abdominal distention.  No orthopnea no increased peripheral edema.  HPI     Past Medical History:  Diagnosis Date  . Acute and subacute hepatic failure without coma   . Allergy   . Anasarca   . Anxiety   . Arthritis   . Ascites 09/2018   no SBP on 850 cc tap 10/13/18  . Asthma    allery induced  . Cirrhosis of liver ([place]) ~ 2004   from NAFLD.  Dx ~ 2004.  Dr Dorcas Mcmurray at [facility]/    . Depression   . Diabetes mellitus without complication ([place])    type 2  . Eczema of both hands   . Endometrial cancer ([place]) 1996, 2003   hysterectomy 1996.  recurrence 2003, treated with radiation.    . Generalized edema   . GI hemorrhage   . 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 in [place]  . Hyperlipidemia   . Hypertension   . Muscle weakness   . Seizures ([place])    last  seizure June 07, 2014   . Thrombocytopenia ([place])   . Thyroid nodule     Patient Active Problem List   Diagnosis Date Noted  . Hyperammonemia ([place]) 09/07/2019  . Transaminitis 09/07/2019  . Lactic acidosis 09/07/2019  . Elevated INR 09/07/2019  . Abdominal pain 09/07/2019  . Radiation proctitis 07/28/2019  . Status post thoracentesis   . Acute on chronic anemia   . Pleural effusion   . Supratherapeutic INR   . Urinary retention   . Radiation cystitis   . Labile blood pressure   . Labile blood glucose   . Anxiety state   . Controlled type 2 diabetes mellitus with hyperglycemia, with long-term current use of insulin ([place])   . Hyponatremia   . Thrombocytopenia ([place])   . Phantom limb pain ([place])   . Postoperative pain   . Amputation above knee ([place]) 07/04/2019  . S/P right knee disarticulation amputation 06/29/2019  . Above knee amputation of right lower extremity ([place]) 06/29/2019  . Hyperglycemia   . Failed total right knee replacement ([place])   . Hepatic encephalopathy ([place]) 12/28/2018  . UTI (urinary tract infection) 12/28/2018  . Bradycardia 12/28/2018  . Hyperkalemia 12/28/2018  . Overweight (BMI 25.0-29.9) 11/23/2018  . Mechanical problem with extremity 11/22/2018  . Cirrhosis of liver with ascites (  [place]) 11/09/2018  . Nausea without vomiting 10/26/2018  . Ascites   . Rectal bleeding   . AKI (acute kidney injury) ([place]) 10/11/2018  . Lower GI bleed 10/10/2018  . Anasarca 10/10/2018  . Hypoalbuminemia 10/10/2018  . Depression 09/28/2018  . Acute hepatic encephalopathy 09/28/2018  . Cirrhosis ([place]) 09/28/2018  . Periprosthetic fracture around internal prosthetic right knee joint 09/24/2018  . S/P right TKA 09/22/2018  . Status post total knee replacement, right 09/22/2018  . DM2 (diabetes mellitus, type 2) ([place]) 07/11/2015  . Hypertension 07/11/2015  . Hyperlipidemia 07/11/2015    Past Surgical History:  Procedure Laterality Date  . AMPUTATION Right 06/29/2019    Procedure: AMPUTATION ABOVE KNEE through knee;  Surgeon: Paralee Cancel, MD;  Location: [facility];  Service: Orthopedics;  Laterality: Right;  2 hrs  . CATARACT EXTRACTION W/PHACO Left 06/04/2017   Procedure: CATARACT EXTRACTION PHACO AND INTRAOCULAR LENS PLACEMENT (IOC);  Surgeon: Baruch Goldmann, MD;  Location: [facility];  Service: Ophthalmology;  Laterality: Left;  CDE: 3.90  . CATARACT EXTRACTION W/PHACO Right 07/15/2017   Procedure: CATARACT EXTRACTION PHACO AND INTRAOCULAR LENS PLACEMENT (IOC);  Surgeon: Baruch Goldmann, MD;  Location: [facility];  Service: Ophthalmology;  Laterality: Right;  CDE: 7.60  . COLONOSCOPY  06/2007   in [place]. for hematochzia.  radiation proctitis  . DILATION AND CURETTAGE OF UTERUS    . ESOPHAGOGASTRODUODENOSCOPY  2016  . ESOPHAGOGASTRODUODENOSCOPY (EGD) WITH PROPOFOL N/A 12/30/2018   Procedure: EGD;  Surgeon: Clarene Essex, MD;  Location: [facility];  Service: Endoscopy;  Laterality: N/A;  . EXCISIONAL TOTAL KNEE ARTHROPLASTY Right 01/03/2019   Procedure: EXCISIONAL TOTAL KNEE ARTHROPLASTY;  Surgeon: Paralee Cancel, MD;  [facility]: [facility];  Service: Orthopedics;  Laterality: Right;  . FLEXIBLE SIGMOIDOSCOPY N/A 12/30/2018   Procedure: FLEXIBLE SIGMOIDOSCOPY;  Surgeon: Clarene Essex, MD;  [facility];  Service: Endoscopy;  Laterality: N/A;  . IR PARACENTESIS  10/13/2018  . IR PARACENTESIS  07/21/2019  . IR THORACENTESIS ASP PLEURAL SPACE W/IMG GUIDE  07/19/2019  . STERIOD INJECTION Left 06/29/2019   Procedure: LEFT KNEE INJECTION;  Surgeon: Paralee Cancel, MD;  [facility];  Service: Orthopedics;  Laterality: Left;  . TOTAL ABDOMINAL HYSTERECTOMY  1996  . TOTAL KNEE ARTHROPLASTY Right 09/22/2018   Procedure: TOTAL KNEE ARTHROPLASTY;  Surgeon: Paralee Cancel, MD;  [facility];  Service: Orthopedics;  Laterality: Right;  70 mins  . TOTAL KNEE REVISION Right 09/27/2018   Procedure: TOTAL KNEE REVISION;  Surgeon: Paralee Cancel, MD;  [facility];  Service: Orthopedics;   Laterality: Right;  . TOTAL KNEE REVISION Right 11/22/2018   Procedure: repair right knee extensor mechanism;  Surgeon: Paralee Cancel, MD;  Location: [facility];  Service: Orthopedics;  Laterality: Right;  90 mins     OB History   No obstetric history on file.     Family History  Problem Relation Age of Onset  . Heart disease Mother   . Diabetes Mother   . Heart disease Father   . Cancer Father   . Cancer Maternal Grandmother   . Heart disease Maternal Grandfather     Social History   Tobacco Use  . Smoking status: Never Smoker  . Smokeless tobacco: Never Used  Vaping Use  . Vaping Use: Never used  Substance Use Topics  . Alcohol use: No  . Drug use: No    Home Medications Prior to Admission medications   Medication Sig Start Date End Date Taking? Authorizing Provider  acetaminophen (TYLENOL) 325  MG tablet Take 1-2 tablets (325-650 mg total) by mouth every 4 (four) hours as needed for mild pain. 07/13/19  Yes Love, Ivan Anchors, PA-C  ALPRAZolam (XANAX) 0.25 MG tablet Take 1 tablet (0.25 mg total) by mouth 2 (two) times daily. 01/06/19  Yes Mariel Aloe, MD  atenolol (TENORMIN) 25 MG tablet Take 2 tablets (50 mg total) by mouth every morning. 09/08/19  Yes Tat, Shanon Brow, MD  atorvastatin (LIPITOR) 80 MG tablet Take 80 mg by mouth daily at 8 pm.   Yes [provider]  buPROPion (WELLBUTRIN XL) 150 MG 24 hr tablet Take 150 mg by mouth in the morning.    Yes [provider]  ENULOSE 10 GM/15ML SOLN Take 30 g by mouth daily.  12/26/19  Yes [provider]  escitalopram (LEXAPRO) 20 MG tablet Take 20 mg by mouth in the morning.  05/17/19  Yes [provider]  ferrous sulfate (FERROUSUL) 325 (65 FE) MG tablet Take 1 tablet (325 mg total) by mouth 3 (three) times daily with meals. Patient taking differently: Take 325 mg by mouth 2 (two) times daily with a meal.  07/27/19 09/10/20 Yes Love, Ivan Anchors, PA-C  fluticasone (FLONASE) 50 MCG/ACT nasal spray Place 1  spray into both nostrils in the morning.    Yes [provider]  furosemide (LASIX) 20 MG tablet Take 1 tablet (20 mg total) by mouth 2 (two) times daily. 07/27/19  Yes Love, Ivan Anchors, PA-C  gabapentin (NEURONTIN) 300 MG capsule Take 1 capsule (300 mg total) by mouth 2 (two) times daily. Patient taking differently: Take 300 mg by mouth 2 (two) times daily as needed (for nerve pain).  07/27/19  Yes Love, Ivan Anchors, PA-C  Insulin Glargine (BASAGLAR KWIKPEN) 100 UNIT/ML Inject 25 Units into the skin See admin instructions. Inject 25 units if sugar is mid 100's.   Yes [provider]  lactulose (CHRONULAC) 10 GM/15ML solution Take 22.5 mLs (15 g total) by mouth 2 (two) times daily. Patient taking differently: Take 30 g by mouth daily.  07/27/19  Yes Love, Ivan Anchors, PA-C  loratadine (CLARITIN) 10 MG tablet Take 10 mg by mouth in the morning.    Yes [provider]  mesalamine (CANASA) 1000 MG suppository Place 1 suppository rectally at bedtime.   Yes [provider]  Multiple Vitamin (MULTIVITAMIN WITH MINERALS) TABS tablet Take 1 tablet by mouth daily. 12/06/18  Yes Babish, Rodman Key, PA-C  pantoprazole (PROTONIX) 40 MG tablet Take 1 tablet (40 mg total) by mouth daily. 09/08/19  Yes Tat, Shanon Brow, MD  potassium chloride (KLOR-CON) 10 MEQ tablet Take 1 tablet (10 mEq total) by mouth daily. Patient taking differently: Take 10 mEq by mouth 2 (two) times daily.  07/27/19  Yes Love, Ivan Anchors, PA-C  rifaximin (XIFAXAN) 550 MG TABS tablet Take 550 mg by mouth 2 (two) times daily.   Yes [provider]  sitaGLIPtin-metformin (JANUMET) 50-500 MG per tablet Take 1 tablet by mouth 2 (two) times daily with a meal.   Yes [provider]  spironolactone (ALDACTONE) 100 MG tablet Take 1 tablet (100 mg total) by mouth daily. Patient taking differently: Take 100 mg by mouth in the morning.  07/28/19  Yes Love, Ivan Anchors, PA-C  traZODone (DESYREL) 50 MG tablet Take 50 mg by mouth at  bedtime as needed for sleep.  05/17/19  Yes [provider]  insulin aspart (NOVOLOG FLEXPEN) 100 UNIT/ML FlexPen Inject 4 Units into the skin in the morning and  at bedtime. Can be up to 8 units twice a day depending on blood sugar. Take mid afternoon only if bloodsugar  is over 180  Takes in evening if it is 200 or over give up to 10 units per daughter    [provider]  lip balm (CARMEX) ointment Apply topically as needed for lip care. Patient not taking: Reported on 01/08/2020 07/13/19   Love, Ivan Anchors, PA-C  traMADol (ULTRAM) 50 MG tablet Take 1 tablet (50 mg total) by mouth every 6 (six) hours as needed for moderate pain. Patient not taking: Reported on 01/08/2020 07/27/19   Love, Ivan Anchors, PA-C  vitamin B-12 (CYANOCOBALAMIN) 500 MCG tablet Take 1 tablet (500 mcg total) by mouth daily. Patient not taking: Reported on 01/08/2020 09/08/19   Tat, Shanon Brow, MD  Ibuprofen-Diphenhydramine Cit (ADVIL PM PO) Take 60 mg by mouth at bedtime.  05/21/17  [provider]    Allergies    Asa [aspirin] and Other  Review of Systems   Review of Systems  Constitutional: Negative for chills and fever.  HENT: Negative for congestion and sore throat.   Eyes: Negative.   Respiratory: Negative for chest tightness and shortness of breath.   Cardiovascular: Positive for chest pain.  Gastrointestinal: Positive for abdominal pain, blood in stool, diarrhea, nausea and vomiting.  Genitourinary: Negative.   Musculoskeletal: Negative for arthralgias, joint swelling and neck pain.  Skin: Negative.  Negative for rash and wound.  Neurological: Negative for dizziness, weakness, light-headedness, numbness and headaches.  Psychiatric/Behavioral: Negative.     Physical Exam Updated Vital Signs BP (!) 130/52   Pulse 64   Temp 98.1 F (36.7 C) (Oral)   Resp 15   Ht 5' 2"  (1.575 m)   Wt 65.8 kg   SpO2 96%   BMI 26.53 kg/m   Physical Exam Vitals and nursing note reviewed.  Constitutional:        Appearance: She is well-developed.  HENT:     Head: Normocephalic and atraumatic.  Eyes:     Conjunctiva/sclera: Conjunctivae normal.     Comments: Subtle scleral icterus, no conjunctival pallor.  Cardiovascular:     Rate and Rhythm: Normal rate and regular rhythm.     Heart sounds: Normal heart sounds.  Pulmonary:     Effort: Pulmonary effort is normal.     Breath sounds: Normal breath sounds. No wheezing.  Abdominal:     General: Bowel sounds are normal.     Palpations: Abdomen is soft.     Tenderness: There is generalized abdominal tenderness. There is no guarding or rebound.  Musculoskeletal:        General: Normal range of motion.     Cervical back: Normal range of motion.     Comments: Right AKA  Skin:    General: Skin is warm and dry.  Neurological:     Mental Status: She is alert.     ED Results / Procedures / Treatments   Labs (all labs ordered are listed, but only abnormal results are displayed) Labs Reviewed  COMPREHENSIVE METABOLIC PANEL - Abnormal; Notable for the following components:      Result Value   Sodium 126 (*)    Chloride 90 (*)    Glucose, Bld 208 (*)    Total Protein 6.0 (*)    Albumin 3.0 (*)    AST 148 (*)    ALT 105 (*)    Total Bilirubin 2.0 (*)    GFR, Estimated 57 (*)  All other components within normal limits  LIPASE, BLOOD - Abnormal; Notable for the following components:   Lipase 55 (*)    All other components within normal limits  CBC WITH DIFFERENTIAL/PLATELET - Abnormal; Notable for the following components:   RDW 16.1 (*)    Platelets 40 (*)    All other components within normal limits  URINALYSIS, ROUTINE W REFLEX MICROSCOPIC - Abnormal; Notable for the following components:   APPearance HAZY (*)    Hgb urine dipstick SMALL (*)    Protein, ur 30 (*)    Bacteria, UA RARE (*)    All other components within normal limits  RESP PANEL BY RT PCR (RSV, FLU A&B, COVID)  POC OCCULT BLOOD, ED  SAMPLE TO BLOOD BANK   TROPONIN I (HIGH SENSITIVITY)  TROPONIN I (HIGH SENSITIVITY)    EKG EKG Interpretation  Date/Time:  Monday January 08 2020 15:41:46 EDT Ventricular Rate:  65 PR Interval:    QRS Duration: 90 QT Interval:  507 QTC Calculation: 528 R Axis:   -25 Text Interpretation: Sinus rhythm Low voltage, precordial leads Abnormal R-wave progression, early transition LVH by voltage Anterior Q waves, possibly due to LVH Prolonged QT interval similar to June 2021 Confirmed by Sherwood Gambler [phone]) on 01/08/2020 5:16:40 PM   Radiology CT ABDOMEN PELVIS W CONTRAST  Result Date: 01/08/2020 CLINICAL DATA:  Recent diagnosis of UTI. Increasing abdominal pain, nausea, vomiting EXAM: CT ABDOMEN AND PELVIS WITH CONTRAST TECHNIQUE: Multidetector CT imaging of the abdomen and pelvis was performed using the standard protocol following bolus administration of intravenous contrast. CONTRAST:  146m OMNIPAQUE IOHEXOL 300 MG/ML  SOLN COMPARISON:  09/07/2019 FINDINGS: Lower chest: Cardiomegaly. Coronary artery and aortic atherosclerosis. Trace right pleural effusion. No confluent opacities. Hepatobiliary: Nodular contour of the liver compatible with cirrhosis. Recanalization of the umbilical vein and spontaneous left splenorenal shunt compatible with portal venous hypertension. No focal hepatic abnormality. Gallbladder unremarkable. Pancreas: No focal abnormality or ductal dilatation. Spleen: Spleen is upper limits and normal in size with a craniocaudal length of 12.7 cm. Scattered calcifications compatible with old granulomatous disease. Adrenals/Urinary Tract: No adrenal abnormality. No focal renal abnormality. No stones or hydronephrosis. Urinary bladder is unremarkable. Stomach/Bowel: Appendix is slightly prominent 8 8 mm but no surrounding inflammation. Large bowel is decompressed and grossly unremarkable. There are dilated small bowel loops with air-fluid levels. Distal small bowel is decompressed. Findings concerning  for distal small bowel obstruction. Exact transition or cause not visualized. Vascular/Lymphatic: Aortic atherosclerosis. No evidence of aneurysm or adenopathy. Reproductive: Prior hysterectomy.  No adnexal masses. Other: Small amount of free fluid adjacent to the liver and in the cul-de-sac. Musculoskeletal: No acute bony abnormality. IMPRESSION: Changes of cirrhosis with evidence of portal venous hypertension. Borderline spleen size. Small amount of ascites. Dilated proximal and mid small bowel loops. Distal small bowel is decompressed. Findings concerning for mid to distal small bowel obstruction. Exact transition not visualized. Aortic atherosclerosis. Electronically Signed   By: KRolm BaptiseM.D.   On: 01/08/2020 18:47   DG Chest Port 1 View  Result Date: 01/08/2020 CLINICAL DATA:  Chest pain EXAM: PORTABLE CHEST 1 VIEW COMPARISON:  09/06/2019 FINDINGS: No focal opacity or pleural effusion. Stable enlarged cardiomediastinal silhouette with aortic atherosclerosis. No pneumothorax. Streaky atelectasis left base. IMPRESSION: No active disease. Electronically Signed   By: KDonavan FoilM.D.   On: 01/08/2020 17:22    Procedures Procedures (including critical care time)  Medications Ordered in ED Medications  sodium chloride 0.9 % bolus 500  mL (has no administration in time range)  ondansetron (ZOFRAN) injection 4 mg (4 mg Intravenous Given 01/08/20 1631)  iohexol (OMNIPAQUE) 300 MG/ML solution 100 mL (100 mLs Intravenous Contrast Given 01/08/20 1822)    ED Course  I have reviewed the triage vital signs and the nursing notes.  Pertinent labs & imaging results that were available during my care of the patient were reviewed by me and considered in my medical decision making (see chart for details).    MDM Rules/Calculators/A&P                          Pt with small bowel obstruction per CT imaging tonight.  She also has electrolyte abnormalities  with the glucose of 208, sodium of 128  corrected, her LFTs are chronically elevated and consistent with her history of cirrhosis.  Discussed findings with Dr. Constance Haw of general surgery.  Patient has had no vomiting while here, although does endorse some mild nausea at this time.  She did not feel strongly she needed Brittany NG tube at this time, but would add if symptoms worsen.  We will plan medical admission.    Call placed to the hospitalist service for medical admission.  Discussed with Dr. Denton Brick who accepts pt for admission.  Final Clinical Impression(s) / ED Diagnoses Final diagnoses:  Small bowel obstruction ([place])  Hyponatremia    Rx / DC Orders ED Discharge Orders    None       Landis Martins 01/08/20 2056    Sherwood Gambler, MD 01/09/20 0900

## 2020-01-08 NOTE — ED Notes (Signed)
Pt placed in hospital gown. Pt brief changed, pt noted to have diarrhea. Pt cleaned and brief changed. Pure wick placed. Ointment applied to perianal region due to excoriated skin noted.

## 2020-01-09 ENCOUNTER — Inpatient Hospital Stay (HOSPITAL_COMMUNITY): Payer: Medicare Other

## 2020-01-09 DIAGNOSIS — K7460 Unspecified cirrhosis of liver: Secondary | ICD-10-CM | POA: Diagnosis present

## 2020-01-09 DIAGNOSIS — K913 Postprocedural intestinal obstruction, unspecified as to partial versus complete: Secondary | ICD-10-CM | POA: Diagnosis not present

## 2020-01-09 DIAGNOSIS — Z96651 Presence of right artificial knee joint: Secondary | ICD-10-CM | POA: Diagnosis not present

## 2020-01-09 DIAGNOSIS — K7581 Nonalcoholic steatohepatitis (NASH): Secondary | ICD-10-CM | POA: Diagnosis present

## 2020-01-09 DIAGNOSIS — Z90710 Acquired absence of both cervix and uterus: Secondary | ICD-10-CM | POA: Diagnosis not present

## 2020-01-09 DIAGNOSIS — K565 Intestinal adhesions [bands], unspecified as to partial versus complete obstruction: Secondary | ICD-10-CM | POA: Diagnosis present

## 2020-01-09 DIAGNOSIS — Z20822 Contact with and (suspected) exposure to covid-19: Secondary | ICD-10-CM | POA: Diagnosis present

## 2020-01-09 DIAGNOSIS — D696 Thrombocytopenia, unspecified: Secondary | ICD-10-CM | POA: Diagnosis not present

## 2020-01-09 DIAGNOSIS — Z79899 Other long term (current) drug therapy: Secondary | ICD-10-CM | POA: Diagnosis not present

## 2020-01-09 DIAGNOSIS — E1165 Type 2 diabetes mellitus with hyperglycemia: Secondary | ICD-10-CM

## 2020-01-09 DIAGNOSIS — I10 Essential (primary) hypertension: Secondary | ICD-10-CM | POA: Diagnosis present

## 2020-01-09 DIAGNOSIS — K7469 Other cirrhosis of liver: Secondary | ICD-10-CM | POA: Diagnosis not present

## 2020-01-09 DIAGNOSIS — E785 Hyperlipidemia, unspecified: Secondary | ICD-10-CM | POA: Diagnosis present

## 2020-01-09 DIAGNOSIS — Z89611 Acquired absence of right leg above knee: Secondary | ICD-10-CM | POA: Diagnosis not present

## 2020-01-09 DIAGNOSIS — R188 Other ascites: Secondary | ICD-10-CM | POA: Diagnosis not present

## 2020-01-09 DIAGNOSIS — Z4659 Encounter for fitting and adjustment of other gastrointestinal appliance and device: Secondary | ICD-10-CM

## 2020-01-09 DIAGNOSIS — Z923 Personal history of irradiation: Secondary | ICD-10-CM | POA: Diagnosis not present

## 2020-01-09 DIAGNOSIS — E871 Hypo-osmolality and hyponatremia: Secondary | ICD-10-CM

## 2020-01-09 DIAGNOSIS — Z794 Long term (current) use of insulin: Secondary | ICD-10-CM

## 2020-01-09 DIAGNOSIS — Z8542 Personal history of malignant neoplasm of other parts of uterus: Secondary | ICD-10-CM | POA: Diagnosis not present

## 2020-01-09 DIAGNOSIS — F32A Depression, unspecified: Secondary | ICD-10-CM | POA: Diagnosis present

## 2020-01-09 DIAGNOSIS — D6959 Other secondary thrombocytopenia: Secondary | ICD-10-CM | POA: Diagnosis present

## 2020-01-09 DIAGNOSIS — Z4682 Encounter for fitting and adjustment of non-vascular catheter: Secondary | ICD-10-CM | POA: Diagnosis not present

## 2020-01-09 DIAGNOSIS — R109 Unspecified abdominal pain: Secondary | ICD-10-CM | POA: Diagnosis not present

## 2020-01-09 DIAGNOSIS — K56609 Unspecified intestinal obstruction, unspecified as to partial versus complete obstruction: Secondary | ICD-10-CM | POA: Diagnosis not present

## 2020-01-09 LAB — BASIC METABOLIC PANEL
Anion gap: 10 (ref 5–15)
BUN: 26 mg/dL — ABNORMAL HIGH (ref 8–23)
CO2: 24 mmol/L (ref 22–32)
Calcium: 9 mg/dL (ref 8.9–10.3)
Chloride: 96 mmol/L — ABNORMAL LOW (ref 98–111)
Creatinine, Ser: 1.01 mg/dL — ABNORMAL HIGH (ref 0.44–1.00)
GFR, Estimated: 56 mL/min — ABNORMAL LOW (ref >60)
Glucose, Bld: 138 mg/dL — ABNORMAL HIGH (ref 70–99)
Potassium: 4.5 mmol/L (ref 3.5–5.1)
Sodium: 130 mmol/L — ABNORMAL LOW (ref 135–145)

## 2020-01-09 LAB — CBC
HCT: 38.6 % (ref 36.0–46.0)
Hemoglobin: 12.7 g/dL (ref 12.0–15.0)
MCH: 31 pg (ref 26.0–34.0)
MCHC: 32.9 g/dL (ref 30.0–36.0)
MCV: 94.1 fL (ref 80.0–100.0)
Platelets: 43 10*3/uL — ABNORMAL LOW (ref 150–400)
RBC: 4.1 MIL/uL (ref 3.87–5.11)
RDW: 16.4 % — ABNORMAL HIGH (ref 11.5–15.5)
WBC: 7.3 10*3/uL (ref 4.0–10.5)
nRBC: 0 % (ref 0.0–0.2)

## 2020-01-09 LAB — GLUCOSE, CAPILLARY
Glucose-Capillary: 141 mg/dL — ABNORMAL HIGH (ref 70–99)
Glucose-Capillary: 153 mg/dL — ABNORMAL HIGH (ref 70–99)
Glucose-Capillary: 142 mg/dL — ABNORMAL HIGH (ref 70–99)

## 2020-01-09 LAB — AMMONIA: Ammonia: 100 umol/L — ABNORMAL HIGH (ref 9–35)

## 2020-01-09 LAB — HEPATIC FUNCTION PANEL
ALT: 98 U/L — ABNORMAL HIGH (ref 0–44)
AST: 120 U/L — ABNORMAL HIGH (ref 15–41)
Albumin: 2.9 g/dL — ABNORMAL LOW (ref 3.5–5.0)
Alkaline Phosphatase: 92 U/L (ref 38–126)
Bilirubin, Direct: 1.2 mg/dL — ABNORMAL HIGH (ref 0.0–0.2)
Indirect Bilirubin: 1.9 mg/dL — ABNORMAL HIGH (ref 0.3–0.9)
Total Bilirubin: 3.1 mg/dL — ABNORMAL HIGH (ref 0.3–1.2)
Total Protein: 5.8 g/dL — ABNORMAL LOW (ref 6.5–8.1)

## 2020-01-09 LAB — PROTIME-INR
INR: 1.2 (ref 0.8–1.2)
Prothrombin Time: 15 seconds (ref 11.4–15.2)

## 2020-01-09 MED ORDER — MORPHINE SULFATE (PF) 2 MG/ML IV SOLN
2.0000 mg | INTRAVENOUS | Status: DC | PRN
  Administered 2020-01-09 – 2020-01-12 (×9): 2 mg via INTRAVENOUS
  Filled 2020-01-09 (×10): qty 1

## 2020-01-09 NOTE — Care Management Obs Status (Signed)
[facility] NOTIFICATION   Patient Details  Name: Brittany Russo MRN: [id number] Date of Birth: [birth year]   Medicare Observation Status Notification Given:  Yes    Tommy Medal 01/09/2020, 2:55 PM

## 2020-01-09 NOTE — ED Notes (Signed)
Report attempted to floor.

## 2020-01-09 NOTE — Progress Notes (Signed)
PROGRESS NOTE    Brittany Russo  OFB:[id number] DOB: 05-29-1945 DOA: 01/08/2020 PCP: Rosalee Kaufman, PA-C    Brief Narrative:  74 year old female with a history of hypertension, diabetes, cirrhosis, right above-the-knee amputation admitted to the hospital with vomiting, loose stools and abdominal pain.  Imaging performed in the emergency room indicated possible small bowel obstruction.  She was admitted to the hospital for further management.   Assessment & Plan:   Principal Problem:   SBO (small bowel obstruction) (HCC) Active Problems:   DM2 (diabetes mellitus, type 2) (HCC)   Hypertension   S/P right TKA   Depression   Cirrhosis ([place])   Controlled type 2 diabetes mellitus with hyperglycemia, with long-term current use of insulin (HCC)   Thrombocytopenia (HCC)   Small bowel obstruction. -Appreciate general surgery input. -NG tube placed for decompression -Continue conservative management -With her underlying liver disease, she would have a very high perioperative mortality risk  NASH cirrhosis -No overt varices noted on CT imaging -Resume lactulose and Xifaxan once able to take p.o. -She is also chronically on Lasix and Aldactone which are currently on hold  Type 2 diabetes -Chronically on Janumet, Lantus and NovoLog -Currently, these medications are on hold while n.p.o. -She is on sliding scale -Blood sugars currently stable  HTN -Chronically on atenolol -This is on hold while she is n.p.o. -Continue to monitor blood pressures  Thrombocytopenia -Related to cirrhosis -Monitor for signs of bleeding  Hyponatremia -Improved with gentle hydration   DVT prophylaxis: SCDs Start: 01/08/20 2334  Code Status: Full code Family Communication: Daughter was updated by Dr. Constance Haw 10/19 Disposition Plan: Status is: Inpatient  Remains inpatient appropriate because:Inpatient level of care appropriate due to severity of illness   Dispo: The patient is from:  Home              Anticipated d/c is to: Home              Anticipated d/c date is: 3 days              Patient currently is not medically stable to d/c.         Consultants:   General surgery  Procedures:     Antimicrobials:       Subjective: Has nausea but no vomiting. No abdominal pain at this time. No BM or gas  Objective: Vitals:   01/09/20 0908 01/09/20 1314 01/09/20 1919 01/09/20 1958  BP: (!) 139/57 (!) 125/55 (!) 130/55 (!) 134/57  Pulse: 64 69 75 77  Resp: 18 18 16 20   Temp: 98.1 F (36.7 C) 98.4 F (36.9 C) 98.6 F (37 C) 98.1 F (36.7 C)  TempSrc: Axillary Oral Oral Oral  SpO2: 98% 93% 95% 95%  Weight:      Height:        Intake/Output Summary (Last 24 hours) at 01/09/2020 2001 Last data filed at 01/09/2020 1300 Gross per 24 hour  Intake 0 ml  Output 100 ml  Net -100 ml   Filed Weights   01/08/20 1532 01/09/20 0120  Weight: 65.8 kg 58.6 kg    Examination:  General exam: Appears calm and comfortable  Respiratory system: Clear to auscultation. Respiratory effort normal. Cardiovascular system: S1 & S2 heard, RRR. No JVD, murmurs, rubs, gallops or clicks. No pedal edema. Gastrointestinal system: Abdomen is distended, soft and nontender. No organomegaly or masses felt. Normal bowel sounds heard. Central nervous system: Alert and oriented. No focal neurological deficits. Extremities: Right above-the-knee potation Skin:  No rashes, lesions or ulcers Psychiatry: Judgement and insight appear normal. Mood & affect appropriate.     Data Reviewed: I have personally reviewed following labs and imaging studies  CBC: Recent Labs  Lab 01/08/20 1633 01/09/20 0609  WBC 5.6 7.3  NEUTROABS 4.3  --   HGB 13.4 12.7  HCT 39.0 38.6  MCV 91.3 94.1  PLT 40* 43*   Basic Metabolic Panel: Recent Labs  Lab 01/08/20 1633 01/08/20 1911 01/09/20 0609  NA 126*  --  130*  K 4.2  --  4.5  CL 90*  --  96*  CO2 24  --  24  GLUCOSE 208*  --  138*    BUN 23  --  26*  CREATININE 0.99  --  1.01*  CALCIUM 9.5  --  9.0  MG  --  2.0  --    GFR: Estimated Creatinine Clearance: 40.4 mL/min (A) (by C-G formula based on SCr of 1.01 mg/dL (H)). Liver Function Tests: Recent Labs  Lab 01/08/20 1633 01/09/20 0911  AST 148* 120*  ALT 105* 98*  ALKPHOS 94 92  BILITOT 2.0* 3.1*  PROT 6.0* 5.8*  ALBUMIN 3.0* 2.9*   Recent Labs  Lab 01/08/20 1633  LIPASE 55*   Recent Labs  Lab 01/09/20 0911  AMMONIA 100*   Coagulation Profile: Recent Labs  Lab 01/09/20 0859  INR 1.2   Cardiac Enzymes: No results for input(s): CKTOTAL, CKMB, CKMBINDEX, TROPONINI in the last 168 hours. BNP (last 3 results) No results for input(s): PROBNP in the last 8760 hours. HbA1C: No results for input(s): HGBA1C in the last 72 hours. CBG: Recent Labs  Lab 01/08/20 2347 01/09/20 0522 01/09/20 1123 01/09/20 1621  GLUCAP 142* 141* 153* 142*   Lipid Profile: No results for input(s): CHOL, HDL, LDLCALC, TRIG, CHOLHDL, LDLDIRECT in the last 72 hours. Thyroid Function Tests: No results for input(s): TSH, T4TOTAL, FREET4, T3FREE, THYROIDAB in the last 72 hours. Anemia Panel: No results for input(s): VITAMINB12, FOLATE, FERRITIN, TIBC, IRON, RETICCTPCT in the last 72 hours. Sepsis Labs: No results for input(s): PROCALCITON, LATICACIDVEN in the last 168 hours.  Recent Results (from the past 240 hour(s))  Resp Panel by RT PCR (RSV, Flu A&B, Covid) - Nasopharyngeal Swab     Status: None   Collection Time: 01/08/20  4:18 PM   Specimen: Nasopharyngeal Swab  Result Value Ref Range Status   SARS Coronavirus 2 by RT PCR NEGATIVE NEGATIVE Final    Comment: (NOTE) SARS-CoV-2 target nucleic acids are NOT DETECTED.  The SARS-CoV-2 RNA is generally detectable in upper respiratoy specimens during the acute phase of infection. The lowest concentration of SARS-CoV-2 viral copies this assay can detect is 131 copies/mL. A negative result does not preclude  SARS-Cov-2 infection and should not be used as the sole basis for treatment or other patient management decisions. A negative result may occur with  improper specimen collection/handling, submission of specimen other than nasopharyngeal swab, presence of viral mutation(s) within the areas targeted by this assay, and inadequate number of viral copies (<131 copies/mL). A negative result must be combined with clinical observations, patient history, and epidemiological information. The expected result is Negative.  Fact Sheet for Patients:  PinkCheek.be  Fact Sheet for Healthcare Providers:  GravelBags.it  This test is no t yet approved or cleared by the [place] FDA and  has been authorized for detection and/or diagnosis of SARS-CoV-2 by FDA under an Emergency Use Authorization (EUA). This EUA will remain  in effect (  meaning this test can be used) for the duration of the COVID-19 declaration under Section 564(b)(1) of the Act, 21 U.S.C. section 360bbb-3(b)(1), unless the authorization is terminated or revoked sooner.     Influenza A by PCR NEGATIVE NEGATIVE Final   Influenza B by PCR NEGATIVE NEGATIVE Final    Comment: (NOTE) The Xpert Xpress SARS-CoV-2/FLU/RSV assay is intended as an aid in  the diagnosis of influenza from Nasopharyngeal swab specimens and  should not be used as a sole basis for treatment. Nasal washings and  aspirates are unacceptable for Xpert Xpress SARS-CoV-2/FLU/RSV  testing.  Fact Sheet for Patients: PinkCheek.be  Fact Sheet for Healthcare Providers: GravelBags.it  This test is not yet approved or cleared by the [place] FDA and  has been authorized for detection and/or diagnosis of SARS-CoV-2 by  FDA under an Emergency Use Authorization (EUA). This EUA will remain  in effect (meaning this test can be used) for the duration of the   Covid-19 declaration under Section 564(b)(1) of the Act, 21  U.S.C. section 360bbb-3(b)(1), unless the authorization is  terminated or revoked.    Respiratory Syncytial Virus by PCR NEGATIVE NEGATIVE Final    Comment: (NOTE) Fact Sheet for Patients: PinkCheek.be  Fact Sheet for Healthcare Providers: GravelBags.it  This test is not yet approved or cleared by the [place] FDA and  has been authorized for detection and/or diagnosis of SARS-CoV-2 by  FDA under an Emergency Use Authorization (EUA). This EUA will remain  in effect (meaning this test can be used) for the duration of the  COVID-19 declaration under Section 564(b)(1) of the Act, 21 U.S.C.  section 360bbb-3(b)(1), unless the authorization is terminated or  revoked. Performed at [facility], [address] [place], [place] [address]          Radiology Studies: CT ABDOMEN PELVIS W CONTRAST  Result Date: 01/08/2020 CLINICAL DATA:  Recent diagnosis of UTI. Increasing abdominal pain, nausea, vomiting EXAM: CT ABDOMEN AND PELVIS WITH CONTRAST TECHNIQUE: Multidetector CT imaging of the abdomen and pelvis was performed using the standard protocol following bolus administration of intravenous contrast. CONTRAST:  140m OMNIPAQUE IOHEXOL 300 MG/ML  SOLN COMPARISON:  09/07/2019 FINDINGS: Lower chest: Cardiomegaly. Coronary artery and aortic atherosclerosis. Trace right pleural effusion. No confluent opacities. Hepatobiliary: Nodular contour of the liver compatible with cirrhosis. Recanalization of the umbilical vein and spontaneous left splenorenal shunt compatible with portal venous hypertension. No focal hepatic abnormality. Gallbladder unremarkable. Pancreas: No focal abnormality or ductal dilatation. Spleen: Spleen is upper limits and normal in size with a craniocaudal length of 12.7 cm. Scattered calcifications compatible with old granulomatous disease. Adrenals/Urinary  Tract: No adrenal abnormality. No focal renal abnormality. No stones or hydronephrosis. Urinary bladder is unremarkable. Stomach/Bowel: Appendix is slightly prominent 8 8 mm but no surrounding inflammation. Large bowel is decompressed and grossly unremarkable. There are dilated small bowel loops with air-fluid levels. Distal small bowel is decompressed. Findings concerning for distal small bowel obstruction. Exact transition or cause not visualized. Vascular/Lymphatic: Aortic atherosclerosis. No evidence of aneurysm or adenopathy. Reproductive: Prior hysterectomy.  No adnexal masses. Other: Small amount of free fluid adjacent to the liver and in the cul-de-sac. Musculoskeletal: No acute bony abnormality. IMPRESSION: Changes of cirrhosis with evidence of portal venous hypertension. Borderline spleen size. Small amount of ascites. Dilated proximal and mid small bowel loops. Distal small bowel is decompressed. Findings concerning for mid to distal small bowel obstruction. Exact transition not visualized. Aortic atherosclerosis. Electronically Signed   By: KLennette Bihari  [place] M.D.   On: 01/08/2020 18:47   DG Chest Port 1 View  Result Date: 01/08/2020 CLINICAL DATA:  Chest pain EXAM: PORTABLE CHEST 1 VIEW COMPARISON:  09/06/2019 FINDINGS: No focal opacity or pleural effusion. Stable enlarged cardiomediastinal silhouette with aortic atherosclerosis. No pneumothorax. Streaky atelectasis left base. IMPRESSION: No active disease. Electronically Signed   By: Donavan Foil M.D.   On: 01/08/2020 17:22        Scheduled Meds: . insulin aspart  0-9 Units Subcutaneous Q6H   Continuous Infusions: . sodium chloride 75 mL/hr at 01/09/20 0128     LOS: 0 days    Time spent: 105mns    JKathie Dike MD [facility]   If 7PM-7AM, please contact night-coverage [email or web address]  01/09/2020, 8:01 PM

## 2020-01-09 NOTE — Progress Notes (Addendum)
Connected NG to low intermittent suction as chest x ray verified placement. Pt resting, expressed discomfort with NG tube, pain 10/10, PRN medication given see mar

## 2020-01-09 NOTE — Consult Note (Signed)
Brittany Russo  Reason for Consult: Small bowel obstruction Referring Physician: Dr. Roderic Palau  Chief Complaint    Abdominal Pain      HPI: Brittany Russo is a 74 y.o. female with cirrhosis from NASH and based on her current numbers Child's Pugh C and Meld 19.  She comes in with abdominal pain, nausea, vomiting, and diarrhea.  She came into the hospital and had a CT scan that demonstrated a dilated small bowel with concern for obstruction. On the read last night there was no certain transition point noted. She was not having any vomiting in the ED and had been having diarrhea. We opted to not do a NG due to concern for cirrhosis and possible varices.  She reported this AM that she was still nauseous and was unsur of any further BMs. She was requesting a rectal tube.  The patient is followed by GI Dr. Therisa Doyne and Dr. Watt Climes, and takes lactulose for encephalopathy and has had some ascites removed in the past but is mostly medically controlled at this time. I spoke with her daughter and reviewed her notes. She had an EGD 2020 that demonstrated no varices at that time. Her daughter reports diarrhea and is worried about her mother and the bowel obstruction.  The patient has had endometrial cancer s/p hysterectomy and radiation for a recurrence.   Past Medical History:  Diagnosis Date  . Acute and subacute hepatic failure without coma   . Allergy   . Anasarca   . Anxiety   . Arthritis   . Ascites 09/2018   no SBP on 850 cc tap 10/13/18  . Asthma    allery induced  . Cirrhosis of liver ([place]) ~ 2004   from NAFLD.  Dx ~ 2004.  Dr Dorcas Mcmurray at [facility]/    . Depression   . Diabetes mellitus without complication ([place])    type 2  . Eczema of both hands   . Endometrial cancer ([place]) 1996, 2003   hysterectomy 1996.  recurrence 2003, treated with radiation.    . Generalized edema   . GI hemorrhage   . 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 in [place]  . Hyperlipidemia   . Hypertension   . Muscle weakness   . Seizures ([place])    last seizure June 07, 2014   . Thrombocytopenia ([place])   . Thyroid nodule     Past Surgical History:  Procedure Laterality Date  . AMPUTATION Right 06/29/2019   Procedure: AMPUTATION ABOVE KNEE through knee;  Surgeon: Paralee Cancel, MD;  [facility]: [facility];  Service: Orthopedics;  Laterality: Right;  2 hrs  . CATARACT EXTRACTION W/PHACO Left 06/04/2017   Procedure: CATARACT EXTRACTION PHACO AND INTRAOCULAR LENS PLACEMENT (IOC);  Surgeon: Baruch Goldmann, MD;  Location: [facility];  Service: Ophthalmology;  Laterality: Left;  CDE: 3.90  . CATARACT EXTRACTION W/PHACO Right 07/15/2017   Procedure: CATARACT EXTRACTION PHACO AND INTRAOCULAR LENS PLACEMENT (IOC);  Surgeon: Baruch Goldmann, MD;  Location: [facility];  Service: Ophthalmology;  Laterality: Right;  CDE: 7.60  . COLONOSCOPY  06/2007   in [place]. for hematochzia.  radiation proctitis  . DILATION AND CURETTAGE OF UTERUS    . ESOPHAGOGASTRODUODENOSCOPY  2016  . ESOPHAGOGASTRODUODENOSCOPY (EGD) WITH PROPOFOL N/A 12/30/2018   Procedure: EGD;  Surgeon: Clarene Essex, MD;  [facility];  Service: Endoscopy;  Laterality: N/A;  . EXCISIONAL TOTAL KNEE ARTHROPLASTY Right 01/03/2019   Procedure: EXCISIONAL TOTAL  KNEE ARTHROPLASTY;  Surgeon: Paralee Cancel, MD;  [facility];  Service: Orthopedics;  Laterality: Right;  . FLEXIBLE SIGMOIDOSCOPY N/A 12/30/2018   Procedure: FLEXIBLE SIGMOIDOSCOPY;  Surgeon: Clarene Essex, MD;  [facility];  Service: Endoscopy;  Laterality: N/A;  . IR PARACENTESIS  10/13/2018  . IR PARACENTESIS  07/21/2019  . IR THORACENTESIS ASP PLEURAL SPACE W/IMG GUIDE  07/19/2019  . STERIOD INJECTION Left 06/29/2019   Procedure: LEFT KNEE INJECTION;  Surgeon: Paralee Cancel, MD;  [facility];  Service: Orthopedics;  Laterality: Left;  . TOTAL ABDOMINAL HYSTERECTOMY  1996  . TOTAL KNEE ARTHROPLASTY Right 09/22/2018    Procedure: TOTAL KNEE ARTHROPLASTY;  Surgeon: Paralee Cancel, MD;  [facility];  Service: Orthopedics;  Laterality: Right;  70 mins  . TOTAL KNEE REVISION Right 09/27/2018   Procedure: TOTAL KNEE REVISION;  Surgeon: Paralee Cancel, MD;  [facility];  Service: Orthopedics;  Laterality: Right;  . TOTAL KNEE REVISION Right 11/22/2018   Procedure: repair right knee extensor mechanism;  Surgeon: Paralee Cancel, MD;  [facility];  Service: Orthopedics;  Laterality: Right;  90 mins    Family History  Problem Relation Age of Onset  . Heart disease Mother   . Diabetes Mother   . Heart disease Father   . Cancer Father   . Cancer Maternal Grandmother   . Heart disease Maternal Grandfather     Social History   Tobacco Use  . Smoking status: Never Smoker  . Smokeless tobacco: Never Used  Vaping Use  . Vaping Use: Never used  Substance Use Topics  . Alcohol use: No  . Drug use: No    Medications: I have reviewed the patient's current medications. Current Facility-Administered Medications  Medication Dose Route Frequency Provider Last Rate Last Admin  . 0.9 %  sodium chloride infusion   Intravenous Continuous Emokpae, Ejiroghene E, MD 75 mL/hr at 01/09/20 0128 IV Pump Association at 01/09/20 0128  . acetaminophen (TYLENOL) tablet 650 mg  650 mg Oral Q6H PRN Emokpae, Ejiroghene E, MD   650 mg at 01/08/20 2342   Or  . acetaminophen (TYLENOL) suppository 650 mg  650 mg Rectal Q6H PRN Emokpae, Ejiroghene E, MD      . hydrALAZINE (APRESOLINE) injection 10 mg  10 mg Intravenous Q4H PRN Emokpae, Ejiroghene E, MD      . insulin aspart (novoLOG) injection 0-9 Units  0-9 Units Subcutaneous Q6H Emokpae, Ejiroghene E, MD   2 Units at 01/09/20 1228  . morphine 2 MG/ML injection 2 mg  2 mg Intravenous Q4H PRN Kathie Dike, MD   2 mg at 01/09/20 0923  . promethazine (PHENERGAN) injection 12.5 mg  12.5 mg Intravenous Q8H PRN Emokpae, Ejiroghene E, MD   12.5 mg at 01/09/20 0931   Allergies   Allergen Reactions  . Asa [Aspirin] Other (See Comments)    Pt not allergic but cannot take due to bleeding   . Other Other (See Comments)    Any jewelry metal-hives and itching      ROS:  A comprehensive review of systems was negative except for: Gastrointestinal: positive for abdominal pain, nausea and vomiting  Blood pressure (!) 125/55, pulse 69, temperature 98.4 F (36.9 C), temperature source Oral, resp. rate 18, height 5' 2"  (1.575 m), weight 58.6 kg, SpO2 93 %. Physical Exam  Results: Results for orders placed or performed during the hospital encounter of 01/08/20 (from the past 48 hour(s))  Urinalysis, Routine w reflex microscopic Urine, Random  Status: Abnormal   Collection Time: 01/08/20  4:17 PM  Result Value Ref Range   Color, Urine YELLOW YELLOW   APPearance HAZY (A) CLEAR   Specific Gravity, Urine 1.026 1.005 - 1.030   pH 6.0 5.0 - 8.0   Glucose, UA NEGATIVE NEGATIVE mg/dL   Hgb urine dipstick SMALL (A) NEGATIVE   Bilirubin Urine NEGATIVE NEGATIVE   Ketones, ur NEGATIVE NEGATIVE mg/dL   Protein, ur 30 (A) NEGATIVE mg/dL   Nitrite NEGATIVE NEGATIVE   Leukocytes,Ua NEGATIVE NEGATIVE   RBC / HPF 6-10 0 - 5 RBC/hpf   WBC, UA 0-5 0 - 5 WBC/hpf   Bacteria, UA RARE (A) NONE SEEN   Squamous Epithelial / LPF 0-5 0 - 5   Mucus PRESENT     Comment: Performed at [facility], [address], [place], [place] [address]  Resp Panel by RT PCR (RSV, Flu A&B, Covid) - Nasopharyngeal Swab     Status: None   Collection Time: 01/08/20  4:18 PM   Specimen: Nasopharyngeal Swab  Result Value Ref Range   SARS Coronavirus 2 by RT PCR NEGATIVE NEGATIVE    Comment: (NOTE) SARS-CoV-2 target nucleic acids are NOT DETECTED.  The SARS-CoV-2 RNA is generally detectable in upper respiratoy specimens during the acute phase of infection. The lowest concentration of SARS-CoV-2 viral copies this assay can detect is 131 copies/mL. A negative result does not preclude SARS-Cov-2 infection  and should not be used as the sole basis for treatment or other patient management decisions. A negative result may occur with  improper specimen collection/handling, submission of specimen other than nasopharyngeal swab, presence of viral mutation(s) within the areas targeted by this assay, and inadequate number of viral copies (<131 copies/mL). A negative result must be combined with clinical observations, patient history, and epidemiological information. The expected result is Negative.  Fact Sheet for Patients:  PinkCheek.be  Fact Sheet for Healthcare Providers:  GravelBags.it  This test is no t yet approved or cleared by the [place] FDA and  has been authorized for detection and/or diagnosis of SARS-CoV-2 by FDA under an Emergency Use Authorization (EUA). This EUA will remain  in effect (meaning this test can be used) for the duration of the COVID-19 declaration under Section 564(b)(1) of the Act, 21 U.S.C. section 360bbb-3(b)(1), unless the authorization is terminated or revoked sooner.     Influenza A by PCR NEGATIVE NEGATIVE   Influenza B by PCR NEGATIVE NEGATIVE    Comment: (NOTE) The Xpert Xpress SARS-CoV-2/FLU/RSV assay is intended as an aid in  the diagnosis of influenza from Nasopharyngeal swab specimens and  should not be used as a sole basis for treatment. Nasal washings and  aspirates are unacceptable for Xpert Xpress SARS-CoV-2/FLU/RSV  testing.  Fact Sheet for Patients: PinkCheek.be  Fact Sheet for Healthcare Providers: GravelBags.it  This test is not yet approved or cleared by the [place] FDA and  has been authorized for detection and/or diagnosis of SARS-CoV-2 by  FDA under an Emergency Use Authorization (EUA). This EUA will remain  in effect (meaning this test can be used) for the duration of the  Covid-19 declaration under  Section 564(b)(1) of the Act, 21  U.S.C. section 360bbb-3(b)(1), unless the authorization is  terminated or revoked.    Respiratory Syncytial Virus by PCR NEGATIVE NEGATIVE    Comment: (NOTE) Fact Sheet for Patients: PinkCheek.be  Fact Sheet for Healthcare Providers: GravelBags.it  This test is not yet approved or cleared by the [place] FDA  and  has been authorized for detection and/or diagnosis of SARS-CoV-2 by  FDA under an Emergency Use Authorization (EUA). This EUA will remain  in effect (meaning this test can be used) for the duration of the  COVID-19 declaration under Section 564(b)(1) of the Act, 21 U.S.C.  section 360bbb-3(b)(1), unless the authorization is terminated or  revoked. Performed at [facility], [address], [place], [place] [address]   Comprehensive metabolic panel     Status: Abnormal   Collection Time: 01/08/20  4:33 PM  Result Value Ref Range   Sodium 126 (L) 135 - 145 mmol/L   Potassium 4.2 3.5 - 5.1 mmol/L   Chloride 90 (L) 98 - 111 mmol/L   CO2 24 22 - 32 mmol/L   Glucose, Bld 208 (H) 70 - 99 mg/dL    Comment: Glucose reference range applies only to samples taken after fasting for at least 8 hours.   BUN 23 8 - 23 mg/dL   Creatinine, Ser 0.99 0.44 - 1.00 mg/dL   Calcium 9.5 8.9 - 10.3 mg/dL   Total Protein 6.0 (L) 6.5 - 8.1 g/dL   Albumin 3.0 (L) 3.5 - 5.0 g/dL   AST 148 (H) 15 - 41 U/L   ALT 105 (H) 0 - 44 U/L   Alkaline Phosphatase 94 38 - 126 U/L   Total Bilirubin 2.0 (H) 0.3 - 1.2 mg/dL   GFR, Estimated 57 (L) >60 mL/min   Anion gap 12 5 - 15    Comment: Performed at [facility], [address] [place], [place] [address]  Lipase, blood     Status: Abnormal   Collection Time: 01/08/20  4:33 PM  Result Value Ref Range   Lipase 55 (H) 11 - 51 U/L    Comment: Performed at [facility], [address] [place], [place] [address]  CBC with Diff     Status: Abnormal   Collection  Time: 01/08/20  4:33 PM  Result Value Ref Range   WBC 5.6 4.0 - 10.5 K/uL   RBC 4.27 3.87 - 5.11 MIL/uL   Hemoglobin 13.4 12.0 - 15.0 g/dL   HCT 39.0 36 - 46 %   MCV 91.3 80.0 - 100.0 fL   MCH 31.4 26.0 - 34.0 pg   MCHC 34.4 30.0 - 36.0 g/dL   RDW 16.1 (H) 11.5 - 15.5 %   Platelets 40 (L) 150 - 400 K/uL    Comment: PLATELET COUNT CONFIRMED BY SMEAR Immature Platelet Fraction may be clinically indicated, consider ordering this additional test [id number]    nRBC 0.0 0.0 - 0.2 %   Neutrophils Relative % 77 %   Neutro Abs 4.3 1.7 - 7.7 K/uL   Lymphocytes Relative 12 %   Lymphs Abs 0.7 0.7 - 4.0 K/uL   Monocytes Relative 10 %   Monocytes Absolute 0.6 0.1 - 1.0 K/uL   Eosinophils Relative 0 %   Eosinophils Absolute 0.0 0.0 - 0.5 K/uL   Basophils Relative 1 %   Basophils Absolute 0.0 0.0 - 0.1 K/uL   Immature Granulocytes 0 %   Abs Immature Granulocytes 0.01 0.00 - 0.07 K/uL    Comment: Performed at [facility], [address] [place],  [address]  Troponin I (High Sensitivity)     Status: None   Collection Time: 01/08/20  4:33 PM  Result Value Ref Range   Troponin I (High Sensitivity) 13 <18 ng/L    Comment: (NOTE) Elevated high sensitivity troponin I (hsTnI) values and significant  changes across serial measurements may suggest ACS but many other  chronic and acute conditions are known to elevate hsTnI results.  Refer to the "Links" section for chest pain algorithms and additional  guidance. Performed at [facility], [address], [place], [place] [address]   Sample to Blood Bank     Status: None   Collection Time: 01/08/20  4:33 PM  Result Value Ref Range   Blood Bank Specimen SAMPLE AVAILABLE FOR TESTING    Sample Expiration      01/09/2020,2359 Performed at [facility], [address] [place], [place] [address]   POC occult blood, ED     Status: None   Collection Time: 01/08/20  4:39 PM  Result Value Ref Range   Fecal Occult Bld NEGATIVE NEGATIVE  Troponin  I (High Sensitivity)     Status: None   Collection Time: 01/08/20  7:11 PM  Result Value Ref Range   Troponin I (High Sensitivity) 13 <18 ng/L    Comment: (NOTE) Elevated high sensitivity troponin I (hsTnI) values and significant  changes across serial measurements may suggest ACS but many other  chronic and acute conditions are known to elevate hsTnI results.  Refer to the "Links" section for chest pain algorithms and additional  guidance. Performed at [facility], [address], [place], [place] [address]   Magnesium     Status: None   Collection Time: 01/08/20  7:11 PM  Result Value Ref Range   Magnesium 2.0 1.7 - 2.4 mg/dL    Comment: Performed at [facility], [address], [place], [place] [address]  CBG monitoring, ED     Status: Abnormal   Collection Time: 01/08/20 11:47 PM  Result Value Ref Range   Glucose-Capillary 142 (H) 70 - 99 mg/dL    Comment: Glucose reference range applies only to samples taken after fasting for at least 8 hours.  Glucose, capillary     Status: Abnormal   Collection Time: 01/09/20  5:22 AM  Result Value Ref Range   Glucose-Capillary 141 (H) 70 - 99 mg/dL    Comment: Glucose reference range applies only to samples taken after fasting for at least 8 hours.  Basic metabolic panel     Status: Abnormal   Collection Time: 01/09/20  6:09 AM  Result Value Ref Range   Sodium 130 (L) 135 - 145 mmol/L   Potassium 4.5 3.5 - 5.1 mmol/L   Chloride 96 (L) 98 - 111 mmol/L   CO2 24 22 - 32 mmol/L   Glucose, Bld 138 (H) 70 - 99 mg/dL    Comment: Glucose reference range applies only to samples taken after fasting for at least 8 hours.   BUN 26 (H) 8 - 23 mg/dL   Creatinine, Ser 1.01 (H) 0.44 - 1.00 mg/dL   Calcium 9.0 8.9 - 10.3 mg/dL   GFR, Estimated 56 (L) >60 mL/min   Anion gap 10 5 - 15    Comment: Performed at [facility], [address], [name],  [address]  CBC     Status: Abnormal   Collection Time: 01/09/20  6:09 AM  Result Value Ref Range    WBC 7.3 4.0 - 10.5 K/uL   RBC 4.10 3.87 - 5.11 MIL/uL   Hemoglobin 12.7 12.0 - 15.0 g/dL   HCT 38.6 36 - 46 %   MCV 94.1 80.0 - 100.0 fL   MCH 31.0 26.0 - 34.0 pg   MCHC 32.9 30.0 - 36.0 g/dL   RDW 16.4 (  H) 11.5 - 15.5 %   Platelets 43 (L) 150 - 400 K/uL    Comment: SPECIMEN CHECKED FOR CLOTS Immature Platelet Fraction may be clinically indicated, consider ordering this additional test [id number] CONSISTENT WITH PREVIOUS RESULT    nRBC 0.0 0.0 - 0.2 %    Comment: Performed at [facility], [address] [place], [place] [address]  Protime-INR     Status: None   Collection Time: 01/09/20  8:59 AM  Result Value Ref Range   Prothrombin Time 15.0 11.4 - 15.2 seconds   INR 1.2 0.8 - 1.2    Comment: (NOTE) INR goal varies based on device and disease states. Performed at [facility], [address], [place], [place] [address]   Hepatic function panel     Status: Abnormal   Collection Time: 01/09/20  9:11 AM  Result Value Ref Range   Total Protein 5.8 (L) 6.5 - 8.1 g/dL   Albumin 2.9 (L) 3.5 - 5.0 g/dL   AST 120 (H) 15 - 41 U/L   ALT 98 (H) 0 - 44 U/L   Alkaline Phosphatase 92 38 - 126 U/L   Total Bilirubin 3.1 (H) 0.3 - 1.2 mg/dL   Bilirubin, Direct 1.2 (H) 0.0 - 0.2 mg/dL   Indirect Bilirubin 1.9 (H) 0.3 - 0.9 mg/dL    Comment: Performed at [facility], [address], [place], [place] [address]  Ammonia     Status: Abnormal   Collection Time: 01/09/20  9:11 AM  Result Value Ref Range   Ammonia 100 (H) 9 - 35 umol/L    Comment: Performed at [facility], [address] [place], [place] [address]  Glucose, capillary     Status: Abnormal   Collection Time: 01/09/20 11:23 AM  Result Value Ref Range   Glucose-Capillary 153 (H) 70 - 99 mg/dL    Comment: Glucose reference range applies only to samples taken after fasting for at least 8 hours.   Personally reviewed and reviewed with Dr. Thornton Papas -Transition point noted in mid lower abdomen where fecalized SB has abrupt neck and  turn, some tenting to the anterior abdominal wall, consistent with obstruction from scar, stomach not very distended, no overt esophageal varices noted  CT ABDOMEN PELVIS W CONTRAST  Result Date: 01/08/2020 CLINICAL DATA:  Recent diagnosis of UTI. Increasing abdominal pain, nausea, vomiting EXAM: CT ABDOMEN AND PELVIS WITH CONTRAST TECHNIQUE: Multidetector CT imaging of the abdomen and pelvis was performed using the standard protocol following bolus administration of intravenous contrast. CONTRAST:  184m OMNIPAQUE IOHEXOL 300 MG/ML  SOLN COMPARISON:  09/07/2019 FINDINGS: Lower chest: Cardiomegaly. Coronary artery and aortic atherosclerosis. Trace right pleural effusion. No confluent opacities. Hepatobiliary: Nodular contour of the liver compatible with cirrhosis. Recanalization of the umbilical vein and spontaneous left splenorenal shunt compatible with portal venous hypertension. No focal hepatic abnormality. Gallbladder unremarkable. Pancreas: No focal abnormality or ductal dilatation. Spleen: Spleen is upper limits and normal in size with a craniocaudal length of 12.7 cm. Scattered calcifications compatible with old granulomatous disease. Adrenals/Urinary Tract: No adrenal abnormality. No focal renal abnormality. No stones or hydronephrosis. Urinary bladder is unremarkable. Stomach/Bowel: Appendix is slightly prominent 8 8 mm but no surrounding inflammation. Large bowel is decompressed and grossly unremarkable. There are dilated small bowel loops with air-fluid levels. Distal small bowel is decompressed. Findings concerning for distal small bowel obstruction. Exact transition or cause not visualized. Vascular/Lymphatic: Aortic atherosclerosis. No evidence of aneurysm or adenopathy. Reproductive: Prior hysterectomy.  No adnexal masses. Other: Small amount  of free fluid adjacent to the liver and in the cul-de-sac. Musculoskeletal: No acute bony abnormality. IMPRESSION: Changes of cirrhosis with evidence of  portal venous hypertension. Borderline spleen size. Small amount of ascites. Dilated proximal and mid small bowel loops. Distal small bowel is decompressed. Findings concerning for mid to distal small bowel obstruction. Exact transition not visualized. Aortic atherosclerosis. Electronically Signed   By: Rolm Baptise M.D.   On: 01/08/2020 18:47   DG Chest Port 1 View  Result Date: 01/08/2020 CLINICAL DATA:  Chest pain EXAM: PORTABLE CHEST 1 VIEW COMPARISON:  09/06/2019 FINDINGS: No focal opacity or pleural effusion. Stable enlarged cardiomediastinal silhouette with aortic atherosclerosis. No pneumothorax. Streaky atelectasis left base. IMPRESSION: No active disease. Electronically Signed   By: Donavan Foil M.D.   On: 01/08/2020 17:22     Assessment & Plan:  EMMAH BRATCHER is a 74 y.o. female with cirrhosis, Child's Pugh C and Meld 19 which puts her at very high risk, 82% perioperative mortality. She has a SBO and this is likely from adhesions from her prior surgery and radiation. She has been having some diarrhea so likely partial versus residual stool in the colon.   -NPO, NG to be placed, no known varices as of 2020 on EGD and nothing overt on CT today  -Cirrhosis management per GI  -Do not think SBFT appropriate in patient that we are trying to avoid surgery in given the high mortality, so will not pursue this route -Have discussed with the daughter and Dr. Roderic Palau, and if no improvement in 24-48 hrs may need to start investigating options for tertiary care transfer for SBO that is not resolving   All questions were answered to the satisfaction of the patient and family.   Virl Cagey 01/09/2020, 4:15 PM

## 2020-01-09 NOTE — Plan of Care (Signed)

## 2020-01-10 DIAGNOSIS — K56609 Unspecified intestinal obstruction, unspecified as to partial versus complete obstruction: Secondary | ICD-10-CM

## 2020-01-10 DIAGNOSIS — D696 Thrombocytopenia, unspecified: Secondary | ICD-10-CM

## 2020-01-10 DIAGNOSIS — Z794 Long term (current) use of insulin: Secondary | ICD-10-CM

## 2020-01-10 DIAGNOSIS — K7460 Unspecified cirrhosis of liver: Secondary | ICD-10-CM

## 2020-01-10 DIAGNOSIS — K7469 Other cirrhosis of liver: Secondary | ICD-10-CM

## 2020-01-10 DIAGNOSIS — E1165 Type 2 diabetes mellitus with hyperglycemia: Secondary | ICD-10-CM

## 2020-01-10 LAB — CBC
HCT: 37.3 % (ref 36.0–46.0)
Hemoglobin: 12.4 g/dL (ref 12.0–15.0)
MCH: 31.9 pg (ref 26.0–34.0)
MCHC: 33.2 g/dL (ref 30.0–36.0)
MCV: 95.9 fL (ref 80.0–100.0)
Platelets: 45 10*3/uL — ABNORMAL LOW (ref 150–400)
RBC: 3.89 MIL/uL (ref 3.87–5.11)
RDW: 17.1 % — ABNORMAL HIGH (ref 11.5–15.5)
WBC: 5.8 10*3/uL (ref 4.0–10.5)
nRBC: 0 % (ref 0.0–0.2)

## 2020-01-10 LAB — COMPREHENSIVE METABOLIC PANEL
ALT: 84 U/L — ABNORMAL HIGH (ref 0–44)
AST: 92 U/L — ABNORMAL HIGH (ref 15–41)
Albumin: 2.7 g/dL — ABNORMAL LOW (ref 3.5–5.0)
Alkaline Phosphatase: 83 U/L (ref 38–126)
Anion gap: 12 (ref 5–15)
BUN: 30 mg/dL — ABNORMAL HIGH (ref 8–23)
CO2: 22 mmol/L (ref 22–32)
Calcium: 8.9 mg/dL (ref 8.9–10.3)
Chloride: 100 mmol/L (ref 98–111)
Creatinine, Ser: 0.78 mg/dL (ref 0.44–1.00)
GFR, Estimated: >60 mL/min (ref >60)
Glucose, Bld: 118 mg/dL — ABNORMAL HIGH (ref 70–99)
Potassium: 4.5 mmol/L (ref 3.5–5.1)
Sodium: 134 mmol/L — ABNORMAL LOW (ref 135–145)
Total Bilirubin: 2.7 mg/dL — ABNORMAL HIGH (ref 0.3–1.2)
Total Protein: 5.5 g/dL — ABNORMAL LOW (ref 6.5–8.1)

## 2020-01-10 LAB — GLUCOSE, CAPILLARY
Glucose-Capillary: 112 mg/dL — ABNORMAL HIGH (ref 70–99)
Glucose-Capillary: 121 mg/dL — ABNORMAL HIGH (ref 70–99)
Glucose-Capillary: 124 mg/dL — ABNORMAL HIGH (ref 70–99)
Glucose-Capillary: 143 mg/dL — ABNORMAL HIGH (ref 70–99)

## 2020-01-10 LAB — HEMOGLOBIN A1C
Hgb A1c MFr Bld: 8 % — ABNORMAL HIGH (ref 4.8–5.6)
Mean Plasma Glucose: 183 mg/dL

## 2020-01-10 MED ORDER — PHENOL 1.4 % MT LIQD
1.0000 | OROMUCOSAL | Status: DC | PRN
  Filled 2020-01-10: qty 177

## 2020-01-10 NOTE — Progress Notes (Signed)
GI Inpatient Follow-up Note  Patient Identification: Brittany Russo is a 74 y.o. female seen for f/up and management of cirrhosis.  Past medical history of Brittany Russo cirrhosis (child Pugh class C), diabetes, endometrial cancer treated with radiation last two thousand three, hypertension hyperlipidemia admitted for abdominal pain with imaging suggesting possible small bowel obstruction     Subjective: Feels pain is slightly worse than yesterday, no BM overnight and denies passing gas. She has nausea but has not been vomiting.  Uncomfortable from NGT. She reports her last bowel movement was on 01/06/20  Scheduled Inpatient Medications:  . insulin aspart  0-9 Units Subcutaneous Q6H    Continuous Inpatient Infusions:   . sodium chloride 75 mL/hr at 01/09/20 0128    PRN Inpatient Medications:  acetaminophen **OR** acetaminophen, hydrALAZINE, morphine injection, promethazine  Review of Systems: Constitutional: Weight is stable.  Eyes: No changes in vision. ENT: No oral lesions, sore throat.  GI: see HPI.  Heme/Lymph: No easy bruising.  CV: No chest pain.  GU: No hematuria.  Integumentary: No rashes.  Neuro: No headaches.  Psych: No depression/anxiety.  Endocrine: No heat/cold intolerance.  Allergic/Immunologic: No urticaria.  Resp: No cough, SOB.  Musculoskeletal: No joint swelling.    Physical Examination: BP (!) 134/58 (BP Location: Left Arm)   Pulse 78   Temp 98.5 F (36.9 C) (Oral)   Resp 20   Ht 5\' 2"  (1.575 m)   Wt 58.6 kg   SpO2 94%   BMI 23.63 kg/m  Gen: NAD, alert and oriented x 4 but appears weak.  HEENT: PEERLA, EOMI, Neck: supple, no JVD or thyromegaly Chest: CTA bilaterally, no wheezes, crackles, or other adventitious sounds CV: RRR, no m/g/c/r Abd: diffusely TTP entire abd worse in upper abd. Hyperactive BS. Non distended.  Ext: no edema, well perfused with 2+ pulses, unable to extend arms appropriately due to weakness to assess asterixis Skin: no rash  or lesions noted Lymph: no LAD  Data: Lab Results  Component Value Date   WBC 5.8 01/10/2020   HGB 12.4 01/10/2020   HCT 37.3 01/10/2020   MCV 95.9 01/10/2020   PLT 45 (L) 01/10/2020   Recent Labs  Lab 01/08/20 1633 01/09/20 0609 01/10/20 0454  HGB 13.4 12.7 12.4   Lab Results  Component Value Date   NA 134 (L) 01/10/2020   K 4.5 01/10/2020   CL 100 01/10/2020   CO2 22 01/10/2020   BUN 30 (H) 01/10/2020   CREATININE 0.78 01/10/2020   Lab Results  Component Value Date   ALT 84 (H) 01/10/2020   AST 92 (H) 01/10/2020   ALKPHOS 83 01/10/2020   BILITOT 2.7 (H) 01/10/2020   Recent Labs  Lab 01/09/20 0859  INR 1.2    CT a/p 01/08/20- IMPRESSION: Changes of cirrhosis with evidence of portal venous hypertension. Borderline spleen size. Small amount of ascites.  Dilated proximal and mid small bowel loops. Distal small bowel is decompressed. Findings concerning for mid to distal small bowel obstruction. Exact transition not visualized.  Aortic atherosclerosis.  Assessment/Plan: [name] is a 74 y.o. female admitted for possible small bowel obstruction from hx of pelvic surgery and radiation with history of Brittany Russo cirrhosis.  1.  Cirrhosis-last EGD 2020 without varices, INR stable but severe thrombocytopenia with platelet 45.  Albumin low 2.7, bili normal at baseline June 2021 and elevated slightly to 3.1 yesterday, improved to 2.7 today.  Small volume ascites on CT at admission, on Lasix & Aldactone at home, not currently  on diuretics and creatinine stable if needed.  Last paracentesis about 6 months ago. Followed by Dr. Therisa Doyne as outpatient. MELD 15  2.  HE- ammonia 100 on admission, on lactulose and Xifaxan at home, can resume when below improves.   3.  Small bowel obstruction-likely due to adhesions from prior surgery and radiation, given diarrhea as well may be partial, has had NG tube placed but output minimal.  Has deferred surgery given extremely high  perioperative mortality of 82%. Surgery following for further management decisions.   Case discussed w/ Dr Laural Golden.  Please call with questions or concerns.    Laurine Blazer, PA-C [facility]

## 2020-01-10 NOTE — Progress Notes (Addendum)
I was present with the medical student for this service. I personally verified the history of present illness, performed the physical exam, and made the plan for this encounter. I have verified the medical student's documentation and made modifications where appropriately. I have personally documented in my own words a brief history, physical, and plan below.     Abdomen distended but soft. Complaints of back /butt pain Labs reassuring.  High risk surgical candidate and non operative management of SBO with NG and NPO.  Can have suppositories. GI for cirrhosis. May ultimately need transfer to tertiary care if not improving and needs surgery   Curlene Labrum, MD [address], [place] [address] [phone] (office)   [facility] Progress Note     Subjective: Patient reports continued abdominal pain of similar intensity and obstipation. Denies emesis. Additionally, reports longstanding chronic, intermittent pain in right ear, present at current. Endorses pain in lower back attributed to lying in bed.   Objective: Vital signs in last 24 hours: Temp:  [98.1 F (36.7 C)-98.6 F (37 C)] 98.5 F (36.9 C) (10/20 0440) Pulse Rate:  [75-78] 78 (10/20 0440) Resp:  [16-20] 20 (10/20 0440) BP: (130-134)/(55-58) 134/58 (10/20 0440) SpO2:  [94 %-96 %] 94 % (10/20 0440)    Intake/Output from previous day: 10/19 0701 - 10/20 0700 In: 1850.5 [I.V.:1850.5] Out: 500 [Urine:500] Intake/Output this shift: No intake/output data recorded.  General appearance: alert, cooperative and mild distress Resp: normal work of breathing GI: soft, diffusely tender to palpation  Lab Results:  Recent Labs    01/09/20 0609 01/10/20 0454  WBC 7.3 5.8  HGB 12.7 12.4  HCT 38.6 37.3  PLT 43* 45*   BMET Recent Labs    01/09/20 0609 01/10/20 0454  NA 130* 134*  K 4.5 4.5  CL 96* 100  CO2 24 22  GLUCOSE 138* 118*  BUN 26*  30*  CREATININE 1.01* 0.78  CALCIUM 9.0 8.9   PT/INR Recent Labs    01/09/20 0859  LABPROT 15.0  INR 1.2    Studies/Results: CT ABDOMEN PELVIS W CONTRAST  Result Date: 01/08/2020 CLINICAL DATA:  Recent diagnosis of UTI. Increasing abdominal pain, nausea, vomiting EXAM: CT ABDOMEN AND PELVIS WITH CONTRAST TECHNIQUE: Multidetector CT imaging of the abdomen and pelvis was performed using the standard protocol following bolus administration of intravenous contrast. CONTRAST:  163m OMNIPAQUE IOHEXOL 300 MG/ML  SOLN COMPARISON:  09/07/2019 FINDINGS: Lower chest: Cardiomegaly. Coronary artery and aortic atherosclerosis. Trace right pleural effusion. No confluent opacities. Hepatobiliary: Nodular contour of the liver compatible with cirrhosis. Recanalization of the umbilical vein and spontaneous left splenorenal shunt compatible with portal venous hypertension. No focal hepatic abnormality. Gallbladder unremarkable. Pancreas: No focal abnormality or ductal dilatation. Spleen: Spleen is upper limits and normal in size with a craniocaudal length of 12.7 cm. Scattered calcifications compatible with old granulomatous disease. Adrenals/Urinary Tract: No adrenal abnormality. No focal renal abnormality. No stones or hydronephrosis. Urinary bladder is unremarkable. Stomach/Bowel: Appendix is slightly prominent 8 8 mm but no surrounding inflammation. Large bowel is decompressed and grossly unremarkable. There are dilated small bowel loops with air-fluid levels. Distal small bowel is decompressed. Findings concerning for distal small bowel obstruction. Exact transition or cause not visualized. Vascular/Lymphatic: Aortic atherosclerosis. No evidence of aneurysm or adenopathy. Reproductive: Prior hysterectomy.  No adnexal masses. Other: Small amount of free fluid adjacent to the liver and in the cul-de-sac. Musculoskeletal: No acute bony abnormality. IMPRESSION: Changes of cirrhosis with evidence  of portal venous  hypertension. Borderline spleen size. Small amount of ascites. Dilated proximal and mid small bowel loops. Distal small bowel is decompressed. Findings concerning for mid to distal small bowel obstruction. Exact transition not visualized. Aortic atherosclerosis. Electronically Signed   By: Rolm Baptise M.D.   On: 01/08/2020 18:47   DG CHEST PORT 1 VIEW  Result Date: 01/09/2020 CLINICAL DATA:  NG tube placement EXAM: PORTABLE CHEST 1 VIEW COMPARISON:  01/08/2020 FINDINGS: NG tube tip is in the proximal stomach with the side port in the distal esophagus. Lungs are clear. Heart is normal size. IMPRESSION: NG tube tip in the proximal stomach. Electronically Signed   By: Rolm Baptise M.D.   On: 01/09/2020 20:03   DG Chest Port 1 View  Result Date: 01/08/2020 CLINICAL DATA:  Chest pain EXAM: PORTABLE CHEST 1 VIEW COMPARISON:  09/06/2019 FINDINGS: No focal opacity or pleural effusion. Stable enlarged cardiomediastinal silhouette with aortic atherosclerosis. No pneumothorax. Streaky atelectasis left base. IMPRESSION: No active disease. Electronically Signed   By: Donavan Foil M.D.   On: 01/08/2020 17:22    Anti-infectives: Anti-infectives (From admission, onward)    None       Assessment/Plan: s/p NG placement and resuscitation in patient with small bowel obstruction, with hx of hysterectomy. Patient remains stable; afebrile with stable vitals, though obstipated and moderately tender. Unfortunately she is a poor surgical candidate, considering her child's Pugh C and Meld 19 scores.   GI -Keep N.p.o,  -Continue NG decompression -Monitor for improvement; surgical intervention at a tertiary center if condition deteriorates or fails to improve with conservative measures -GI consultation for cirrhosis evaluation  Neuro -Tylenol PRN for pain  -regular ambulation to control back pain    LOS: 1 day    Janne Lab 01/10/2020

## 2020-01-10 NOTE — Plan of Care (Signed)

## 2020-01-10 NOTE — Progress Notes (Signed)
PROGRESS NOTE    Brittany Russo  NGE:[id number] DOB: [birth year] DOA: 01/08/2020 PCP: Rosalee Kaufman, PA-C   Brief Narrative:  74 year old female with a history of hypertension, diabetes, cirrhosis, right above-the-knee amputation admitted to the hospital with vomiting, loose stools and abdominal pain.  Imaging performed in the emergency room indicated possible small bowel obstruction.  She was admitted to the hospital for further management.  10/20: Patient continues to remain on NG tube for decompression of small bowel obstruction.  She is high risk for surgical management.  She denies any further nausea or vomiting or abdominal pain.  No flatus or bowel movement noted as of yet.  GI consultation pending for liver cirrhosis evaluation.  Assessment & Plan:   Principal Problem:   SBO (small bowel obstruction) (HCC) Active Problems:   DM2 (diabetes mellitus, type 2) (HCC)   Hypertension   S/P right TKA   Depression   Cirrhosis ([place])   Controlled type 2 diabetes mellitus with hyperglycemia, with long-term current use of insulin (HCC)   Thrombocytopenia (HCC)   Small bowel obstruction -Appreciate ongoing general surgery input. -NG tube placed for decompression -Continue conservative management -With her underlying liver disease, she would have a very high perioperative mortality risk -No flatus or bowel movement noted as of yet -Continue IV fluid and advance diet per general surgery once appropriate -Chloraseptic Spray added for sore throat and discomfort from NG tube  NASH cirrhosis -No overt varices noted on CT imaging -Resume lactulose and Xifaxan once able to take p.o. -Monitor a.m. ammonia level -She is also chronically on Lasix and Aldactone which are currently on hold, plan to keep IV fluid for now  Type 2 diabetes -Chronically on Janumet, Lantus and NovoLog -Currently, these medications are on hold while n.p.o. -She is on sliding scale -Blood sugars  currently stable -Hemoglobin A1c noted to be 8%  HTN -Chronically on atenolol -This is on hold while she is n.p.o. -Continue to monitor blood pressures -IV labetalol as needed  Thrombocytopenia-stable -Related to cirrhosis -Monitor for signs of bleeding  Hyponatremia-improving -Continue ongoing normal saline infusion, especially while n.p.o. -Recheck a.m. labs   DVT prophylaxis: SCDs  Code Status: Full code Family Communication: Daughter was updated 10/20 Disposition Plan: Inpatient  Remains inpatient appropriate because:Inpatient level of care appropriate due to severity of illness   Dispo: The patient is from: Home  Anticipated d/c is to: Home  Anticipated d/c date is: 2-3 days  Patient currently is not medically stable to d/c.  She still remains n.p.o. and has not had return of bowel function.   Consultants:   General surgery  Procedures:   NG tube placement 10/19  Antimicrobials:    Anti-infectives (From admission, onward)   None       Subjective: Patient seen and evaluated today with no new acute complaints or concerns.  She complains of some sore throat and NG tube discomfort, otherwise no abdominal pain, nausea or vomiting noted.  She denies any flatus or bowel movement as of yet.  Objective: Vitals:   01/09/20 1919 01/09/20 1958 01/09/20 2014 01/10/20 0440  BP: (!) 130/55 (!) 134/57  (!) 134/58  Pulse: 75 77  78  Resp: 16 20  20   Temp: 98.6 F (37 C) 98.1 F (36.7 C)  98.5 F (36.9 C)  TempSrc: Oral Oral  Oral  SpO2: 95% 95% 96% 94%  Weight:      Height:        Intake/Output Summary (Last 24 hours) at  01/10/2020 1157 Last data filed at 01/10/2020 0800 Gross per 24 hour  Intake 1850.51 ml  Output 500 ml  Net 1350.51 ml   Filed Weights   01/08/20 1532 01/09/20 0120  Weight: 65.8 kg 58.6 kg    Examination:  General exam: Appears calm and comfortable  Respiratory system: Clear to  auscultation. Respiratory effort normal. Cardiovascular system: S1 & S2 heard, RRR. Gastrointestinal system: Abdomen is nondistended, soft and nontender.  NG tube to low intermittent suction. Central nervous system: Alert and oriented. No focal neurological deficits. Extremities: No edema Skin: No rashes, lesions or ulcers Psychiatry: Flat affect    Data Reviewed: I have personally reviewed following labs and imaging studies  CBC: Recent Labs  Lab 01/08/20 1633 01/09/20 0609 01/10/20 0454  WBC 5.6 7.3 5.8  NEUTROABS 4.3  --   --   HGB 13.4 12.7 12.4  HCT 39.0 38.6 37.3  MCV 91.3 94.1 95.9  PLT 40* 43* 45*   Basic Metabolic Panel: Recent Labs  Lab 01/08/20 1633 01/08/20 1911 01/09/20 0609 01/10/20 0454  NA 126*  --  130* 134*  K 4.2  --  4.5 4.5  CL 90*  --  96* 100  CO2 24  --  24 22  GLUCOSE 208*  --  138* 118*  BUN 23  --  26* 30*  CREATININE 0.99  --  1.01* 0.78  CALCIUM 9.5  --  9.0 8.9  MG  --  2.0  --   --    GFR: Estimated Creatinine Clearance: 51 mL/min (by C-G formula based on SCr of 0.78 mg/dL). Liver Function Tests: Recent Labs  Lab 01/08/20 1633 01/09/20 0911 01/10/20 0454  AST 148* 120* 92*  ALT 105* 98* 84*  ALKPHOS 94 92 83  BILITOT 2.0* 3.1* 2.7*  PROT 6.0* 5.8* 5.5*  ALBUMIN 3.0* 2.9* 2.7*   Recent Labs  Lab 01/08/20 1633  LIPASE 55*   Recent Labs  Lab 01/09/20 0911  AMMONIA 100*   Coagulation Profile: Recent Labs  Lab 01/09/20 0859  INR 1.2   Cardiac Enzymes: No results for input(s): CKTOTAL, CKMB, CKMBINDEX, TROPONINI in the last 168 hours. BNP (last 3 results) No results for input(s): PROBNP in the last 8760 hours. HbA1C: Recent Labs    01/08/20 1911  HGBA1C 8.0*   CBG: Recent Labs  Lab 01/09/20 1123 01/09/20 1621 01/10/20 0005 01/10/20 0554 01/10/20 1135  GLUCAP 153* 142* 112* 121* 143*   Lipid Profile: No results for input(s): CHOL, HDL, LDLCALC, TRIG, CHOLHDL, LDLDIRECT in the last 72 hours. Thyroid  Function Tests: No results for input(s): TSH, T4TOTAL, FREET4, T3FREE, THYROIDAB in the last 72 hours. Anemia Panel: No results for input(s): VITAMINB12, FOLATE, FERRITIN, TIBC, IRON, RETICCTPCT in the last 72 hours. Sepsis Labs: No results for input(s): PROCALCITON, LATICACIDVEN in the last 168 hours.  Recent Results (from the past 240 hour(s))  Resp Panel by RT PCR (RSV, Flu A&B, Covid) - Nasopharyngeal Swab     Status: None   Collection Time: 01/08/20  4:18 PM   Specimen: Nasopharyngeal Swab  Result Value Ref Range Status   SARS Coronavirus 2 by RT PCR NEGATIVE NEGATIVE Final    Comment: (NOTE) SARS-CoV-2 target nucleic acids are NOT DETECTED.  The SARS-CoV-2 RNA is generally detectable in upper respiratoy specimens during the acute phase of infection. The lowest concentration of SARS-CoV-2 viral copies this assay can detect is 131 copies/mL. A negative result does not preclude SARS-Cov-2 infection and should not be used as  the sole basis for treatment or other patient management decisions. A negative result may occur with  improper specimen collection/handling, submission of specimen other than nasopharyngeal swab, presence of viral mutation(s) within the areas targeted by this assay, and inadequate number of viral copies (<131 copies/mL). A negative result must be combined with clinical observations, patient history, and epidemiological information. The expected result is Negative.  Fact Sheet for Patients:  PinkCheek.be  Fact Sheet for Healthcare Providers:  GravelBags.it  This test is no t yet approved or cleared by the [place] FDA and  has been authorized for detection and/or diagnosis of SARS-CoV-2 by FDA under an Emergency Use Authorization (EUA). This EUA will remain  in effect (meaning this test can be used) for the duration of the COVID-19 declaration under Section 564(b)(1) of the Act, 21  U.S.C. section 360bbb-3(b)(1), unless the authorization is terminated or revoked sooner.     Influenza A by PCR NEGATIVE NEGATIVE Final   Influenza B by PCR NEGATIVE NEGATIVE Final    Comment: (NOTE) The Xpert Xpress SARS-CoV-2/FLU/RSV assay is intended as an aid in  the diagnosis of influenza from Nasopharyngeal swab specimens and  should not be used as a sole basis for treatment. Nasal washings and  aspirates are unacceptable for Xpert Xpress SARS-CoV-2/FLU/RSV  testing.  Fact Sheet for Patients: PinkCheek.be  Fact Sheet for Healthcare Providers: GravelBags.it  This test is not yet approved or cleared by the [place] FDA and  has been authorized for detection and/or diagnosis of SARS-CoV-2 by  FDA under an Emergency Use Authorization (EUA). This EUA will remain  in effect (meaning this test can be used) for the duration of the  Covid-19 declaration under Section 564(b)(1) of the Act, 21  U.S.C. section 360bbb-3(b)(1), unless the authorization is  terminated or revoked.    Respiratory Syncytial Virus by PCR NEGATIVE NEGATIVE Final    Comment: (NOTE) Fact Sheet for Patients: PinkCheek.be  Fact Sheet for Healthcare Providers: GravelBags.it  This test is not yet approved or cleared by the [place] FDA and  has been authorized for detection and/or diagnosis of SARS-CoV-2 by  FDA under an Emergency Use Authorization (EUA). This EUA will remain  in effect (meaning this test can be used) for the duration of the  COVID-19 declaration under Section 564(b)(1) of the Act, 21 U.S.C.  section 360bbb-3(b)(1), unless the authorization is terminated or  revoked. Performed at [facility], [address], [place], [place] [address]          Radiology Studies: CT ABDOMEN PELVIS W CONTRAST  Result Date: 01/08/2020 CLINICAL DATA:  Recent diagnosis of UTI.  Increasing abdominal pain, nausea, vomiting EXAM: CT ABDOMEN AND PELVIS WITH CONTRAST TECHNIQUE: Multidetector CT imaging of the abdomen and pelvis was performed using the standard protocol following bolus administration of intravenous contrast. CONTRAST:  142m OMNIPAQUE IOHEXOL 300 MG/ML  SOLN COMPARISON:  09/07/2019 FINDINGS: Lower chest: Cardiomegaly. Coronary artery and aortic atherosclerosis. Trace right pleural effusion. No confluent opacities. Hepatobiliary: Nodular contour of the liver compatible with cirrhosis. Recanalization of the umbilical vein and spontaneous left splenorenal shunt compatible with portal venous hypertension. No focal hepatic abnormality. Gallbladder unremarkable. Pancreas: No focal abnormality or ductal dilatation. Spleen: Spleen is upper limits and normal in size with a craniocaudal length of 12.7 cm. Scattered calcifications compatible with old granulomatous disease. Adrenals/Urinary Tract: No adrenal abnormality. No focal renal abnormality. No stones or hydronephrosis. Urinary bladder is unremarkable. Stomach/Bowel: Appendix is slightly prominent 8 8 mm but no surrounding  inflammation. Large bowel is decompressed and grossly unremarkable. There are dilated small bowel loops with air-fluid levels. Distal small bowel is decompressed. Findings concerning for distal small bowel obstruction. Exact transition or cause not visualized. Vascular/Lymphatic: Aortic atherosclerosis. No evidence of aneurysm or adenopathy. Reproductive: Prior hysterectomy.  No adnexal masses. Other: Small amount of free fluid adjacent to the liver and in the cul-de-sac. Musculoskeletal: No acute bony abnormality. IMPRESSION: Changes of cirrhosis with evidence of portal venous hypertension. Borderline spleen size. Small amount of ascites. Dilated proximal and mid small bowel loops. Distal small bowel is decompressed. Findings concerning for mid to distal small bowel obstruction. Exact transition not visualized.  Aortic atherosclerosis. Electronically Signed   By: Rolm Baptise M.D.   On: 01/08/2020 18:47   DG CHEST PORT 1 VIEW  Result Date: 01/09/2020 CLINICAL DATA:  NG tube placement EXAM: PORTABLE CHEST 1 VIEW COMPARISON:  01/08/2020 FINDINGS: NG tube tip is in the proximal stomach with the side port in the distal esophagus. Lungs are clear. Heart is normal size. IMPRESSION: NG tube tip in the proximal stomach. Electronically Signed   By: Rolm Baptise M.D.   On: 01/09/2020 20:03   DG Chest Port 1 View  Result Date: 01/08/2020 CLINICAL DATA:  Chest pain EXAM: PORTABLE CHEST 1 VIEW COMPARISON:  09/06/2019 FINDINGS: No focal opacity or pleural effusion. Stable enlarged cardiomediastinal silhouette with aortic atherosclerosis. No pneumothorax. Streaky atelectasis left base. IMPRESSION: No active disease. Electronically Signed   By: Donavan Foil M.D.   On: 01/08/2020 17:22        Scheduled Meds: . insulin aspart  0-9 Units Subcutaneous Q6H   Continuous Infusions: . sodium chloride 75 mL/hr at 01/09/20 0128     LOS: 1 day    Time spent: 35 minutes    Kenzo Ozment D Manuella Ghazi, DO [facility]  If 7PM-7AM, please contact night-coverage [email or web address] 01/10/2020, 11:57 AM

## 2020-01-11 ENCOUNTER — Inpatient Hospital Stay (HOSPITAL_COMMUNITY): Payer: Medicare Other

## 2020-01-11 DIAGNOSIS — R188 Other ascites: Secondary | ICD-10-CM | POA: Diagnosis not present

## 2020-01-11 DIAGNOSIS — K56609 Unspecified intestinal obstruction, unspecified as to partial versus complete obstruction: Secondary | ICD-10-CM

## 2020-01-11 DIAGNOSIS — K7460 Unspecified cirrhosis of liver: Secondary | ICD-10-CM | POA: Diagnosis not present

## 2020-01-11 LAB — CBC
HCT: 36.4 % (ref 36.0–46.0)
Hemoglobin: 11.9 g/dL — ABNORMAL LOW (ref 12.0–15.0)
MCH: 31.3 pg (ref 26.0–34.0)
MCHC: 32.7 g/dL (ref 30.0–36.0)
MCV: 95.8 fL (ref 80.0–100.0)
Platelets: 40 10*3/uL — ABNORMAL LOW (ref 150–400)
RBC: 3.8 MIL/uL — ABNORMAL LOW (ref 3.87–5.11)
RDW: 16.9 % — ABNORMAL HIGH (ref 11.5–15.5)
WBC: 4.6 10*3/uL (ref 4.0–10.5)
nRBC: 0 % (ref 0.0–0.2)

## 2020-01-11 LAB — GLUCOSE, CAPILLARY
Glucose-Capillary: 116 mg/dL — ABNORMAL HIGH (ref 70–99)
Glucose-Capillary: 127 mg/dL — ABNORMAL HIGH (ref 70–99)
Glucose-Capillary: 147 mg/dL — ABNORMAL HIGH (ref 70–99)
Glucose-Capillary: 166 mg/dL — ABNORMAL HIGH (ref 70–99)

## 2020-01-11 LAB — COMPREHENSIVE METABOLIC PANEL
ALT: 84 U/L — ABNORMAL HIGH (ref 0–44)
AST: 87 U/L — ABNORMAL HIGH (ref 15–41)
Albumin: 2.8 g/dL — ABNORMAL LOW (ref 3.5–5.0)
Alkaline Phosphatase: 79 U/L (ref 38–126)
Anion gap: 13 (ref 5–15)
BUN: 37 mg/dL — ABNORMAL HIGH (ref 8–23)
CO2: 20 mmol/L — ABNORMAL LOW (ref 22–32)
Calcium: 8.9 mg/dL (ref 8.9–10.3)
Chloride: 104 mmol/L (ref 98–111)
Creatinine, Ser: 0.86 mg/dL (ref 0.44–1.00)
GFR, Estimated: >60 mL/min (ref >60)
Glucose, Bld: 142 mg/dL — ABNORMAL HIGH (ref 70–99)
Potassium: 3.9 mmol/L (ref 3.5–5.1)
Sodium: 137 mmol/L (ref 135–145)
Total Bilirubin: 3.6 mg/dL — ABNORMAL HIGH (ref 0.3–1.2)
Total Protein: 5.4 g/dL — ABNORMAL LOW (ref 6.5–8.1)

## 2020-01-11 LAB — MAGNESIUM: Magnesium: 2.2 mg/dL (ref 1.7–2.4)

## 2020-01-11 LAB — AMMONIA: Ammonia: 33 umol/L (ref 9–35)

## 2020-01-11 MED ORDER — PROMETHAZINE HCL 25 MG/ML IJ SOLN
6.2500 mg | Freq: Three times a day (TID) | INTRAMUSCULAR | Status: DC | PRN
  Administered 2020-01-11: 6.25 mg via INTRAVENOUS
  Filled 2020-01-11: qty 1

## 2020-01-11 NOTE — Progress Notes (Signed)
I was present with the medical student for this service. I personally verified the history of present illness, performed the physical exam, and made the plan for this encounter. I have verified the medical student's documentation and made modifications where appropriately. I have personally documented in my own words a brief history, physical, and plan below.      Had bm but Xray with continued dilated bowel. Continued distention and tenderness.   Continue NPO and NG for now. May need to consider options for transfer if not improving.  Curlene Labrum, MD [facility], [place] [address] [phone] (office)   [facility] Progress Note     Subjective: Patient with continued abdominal pain. She stooled overnight. Denies nausea or emesis. Her daughter was present at bedside.     Objective: Vital signs in last 24 hours: Temp:  [97.7 F (36.5 C)-98.4 F (36.9 C)] 97.7 F (36.5 C) (10/21 1352) Pulse Rate:  [74-83] 83 (10/21 1352) Resp:  [16-20] 16 (10/21 1352) BP: (130-142)/(57-69) 136/61 (10/21 1352) SpO2:  [98 %-99 %] 99 % (10/21 1352)    Intake/Output from previous day: No intake/output data recorded. Intake/Output this shift: Total I/O In: -  Out: 200 [Urine:200]  General appearance: alert, cooperative and mild distress Resp: normal work of breathing GI: soft, distended, moderate diffuse tenderness  Lab Results:  Recent Labs    01/10/20 0454 01/11/20 0333  WBC 5.8 4.6  HGB 12.4 11.9*  HCT 37.3 36.4  PLT 45* 40*   BMET Recent Labs    01/10/20 0454 01/11/20 0333  NA 134* 137  K 4.5 3.9  CL 100 104  CO2 22 20*  GLUCOSE 118* 142*  BUN 30* 37*  CREATININE 0.78 0.86  CALCIUM 8.9 8.9   PT/INR Recent Labs    01/09/20 0859  LABPROT 15.0  INR 1.2    Studies/Results: DG Abd 1 View  Result Date: 01/11/2020 CLINICAL DATA:  Small-bowel obstruction EXAM: ABDOMEN - 1 VIEW COMPARISON:   Portable exam 1146 hours compared to 12/02/2018 FINDINGS: Tip of nasogastric tube projects over proximal stomach with proximal side-port at approximately the gastroesophageal junction. Dilated loops of small bowel in the upper and mid abdomen consistent with small bowel obstruction. Paucity of colonic gas. Bones demineralized. IMPRESSION: Increased small bowel dilatation since prior exam consistent with small bowel obstruction. Electronically Signed   By: Lavonia Dana M.D.   On: 01/11/2020 13:22   DG CHEST PORT 1 VIEW  Result Date: 01/09/2020 CLINICAL DATA:  NG tube placement EXAM: PORTABLE CHEST 1 VIEW COMPARISON:  01/08/2020 FINDINGS: NG tube tip is in the proximal stomach with the side port in the distal esophagus. Lungs are clear. Heart is normal size. IMPRESSION: NG tube tip in the proximal stomach. Electronically Signed   By: Rolm Baptise M.D.   On: 01/09/2020 20:03    Anti-infectives: Anti-infectives (From admission, onward)   None      Assessment/Plan: s/p NG tube decompression in clinically stable patient presenting with small bowel obstruction; with one recorded BM; and recent abdominal xray significant for increased dilation. Patient remains a poor surgical candidate given her child's Pugh and Meld 19 scores.   FEN/GI  -Keep N.p.o -Continue N.G. tube decompression  -continued monitoring, with plans for surgical intervention at a tertiary center if indicated.    LOS: 2 days    Janne Lab 01/11/2020

## 2020-01-11 NOTE — Progress Notes (Addendum)
Subjective:  NG tube really bothering her. Throat pain. Still having abdominal pain. Passed stool and flatus in last 24 hours.   Objective: Vital signs in last 24 hours: Temp:  [98 F (36.7 C)-98.4 F (36.9 C)] 98 F (36.7 C) (10/21 0549) Pulse Rate:  [74-80] 80 (10/21 0549) Resp:  [16-20] 20 (10/21 0549) BP: (130-147)/(57-70) 142/69 (10/21 0549) SpO2:  [98 %-99 %] 98 % (10/21 0549)   General:   Alert,  Appears uncomfortable. NAD. NGT in place with minimal output.  Head:  Normocephalic and atraumatic. Eyes:  Sclera clear, no icterus.   Abdomen:  Soft, mild diffuse tenderness and nondistended. Hypoactive bowel sounds.   Extremities:  Left lower extremity without clubbing, deformity or edema. S/p right AKA. Neurologic:  Alert and  oriented x4;  grossly normal neurologically. Skin:  Intact without significant lesions or rashes. Psych:  Alert and cooperative. Normal mood and affect.  Intake/Output from previous day: No intake/output data recorded. Intake/Output this shift: No intake/output data recorded.  Lab Results: CBC Recent Labs    01/09/20 0609 01/10/20 0454 01/11/20 0333  WBC 7.3 5.8 4.6  HGB 12.7 12.4 11.9*  HCT 38.6 37.3 36.4  MCV 94.1 95.9 95.8  PLT 43* 45* 40*   BMET Recent Labs    01/09/20 0609 01/10/20 0454 01/11/20 0333  NA 130* 134* 137  K 4.5 4.5 3.9  CL 96* 100 104  CO2 24 22 20*  GLUCOSE 138* 118* 142*  BUN 26* 30* 37*  CREATININE 1.01* 0.78 0.86  CALCIUM 9.0 8.9 8.9   LFTs Recent Labs    01/09/20 0911 01/10/20 0454 01/11/20 0333  BILITOT 3.1* 2.7* 3.6*  BILIDIR 1.2*  --   --   IBILI 1.9*  --   --   ALKPHOS 92 83 79  AST 120* 92* 87*  ALT 98* 84* 84*  PROT 5.8* 5.5* 5.4*  ALBUMIN 2.9* 2.7* 2.8*   Recent Labs    01/08/20 1633  LIPASE 55*   PT/INR Recent Labs    01/09/20 0859  LABPROT 15.0  INR 1.2      Imaging Studies: CT ABDOMEN PELVIS W CONTRAST  Result Date: 01/08/2020 CLINICAL DATA:  Recent diagnosis of UTI.  Increasing abdominal pain, nausea, vomiting EXAM: CT ABDOMEN AND PELVIS WITH CONTRAST TECHNIQUE: Multidetector CT imaging of the abdomen and pelvis was performed using the standard protocol following bolus administration of intravenous contrast. CONTRAST:  162m OMNIPAQUE IOHEXOL 300 MG/ML  SOLN COMPARISON:  09/07/2019 FINDINGS: Lower chest: Cardiomegaly. Coronary artery and aortic atherosclerosis. Trace right pleural effusion. No confluent opacities. Hepatobiliary: Nodular contour of the liver compatible with cirrhosis. Recanalization of the umbilical vein and spontaneous left splenorenal shunt compatible with portal venous hypertension. No focal hepatic abnormality. Gallbladder unremarkable. Pancreas: No focal abnormality or ductal dilatation. Spleen: Spleen is upper limits and normal in size with a craniocaudal length of 12.7 cm. Scattered calcifications compatible with old granulomatous disease. Adrenals/Urinary Tract: No adrenal abnormality. No focal renal abnormality. No stones or hydronephrosis. Urinary bladder is unremarkable. Stomach/Bowel: Appendix is slightly prominent 8 8 mm but no surrounding inflammation. Large bowel is decompressed and grossly unremarkable. There are dilated small bowel loops with air-fluid levels. Distal small bowel is decompressed. Findings concerning for distal small bowel obstruction. Exact transition or cause not visualized. Vascular/Lymphatic: Aortic atherosclerosis. No evidence of aneurysm or adenopathy. Reproductive: Prior hysterectomy.  No adnexal masses. Other: Small amount of free fluid adjacent to the liver and in the cul-de-sac. Musculoskeletal: No acute bony abnormality. IMPRESSION:  Changes of cirrhosis with evidence of portal venous hypertension. Borderline spleen size. Small amount of ascites. Dilated proximal and mid small bowel loops. Distal small bowel is decompressed. Findings concerning for mid to distal small bowel obstruction. Exact transition not visualized.  Aortic atherosclerosis. Electronically Signed   By: Rolm Baptise M.D.   On: 01/08/2020 18:47   DG CHEST PORT 1 VIEW  Result Date: 01/09/2020 CLINICAL DATA:  NG tube placement EXAM: PORTABLE CHEST 1 VIEW COMPARISON:  01/08/2020 FINDINGS: NG tube tip is in the proximal stomach with the side port in the distal esophagus. Lungs are clear. Heart is normal size. IMPRESSION: NG tube tip in the proximal stomach. Electronically Signed   By: Rolm Baptise M.D.   On: 01/09/2020 20:03   DG Chest Port 1 View  Result Date: 01/08/2020 CLINICAL DATA:  Chest pain EXAM: PORTABLE CHEST 1 VIEW COMPARISON:  09/06/2019 FINDINGS: No focal opacity or pleural effusion. Stable enlarged cardiomediastinal silhouette with aortic atherosclerosis. No pneumothorax. Streaky atelectasis left base. IMPRESSION: No active disease. Electronically Signed   By: Donavan Foil M.D.   On: 01/08/2020 17:22  [2 weeks]   Assessment: 74 y/o female with history of NASH cirrhosis, remote endometrial cancer with previous pelvic surgery and radiation presenting with small bowel obstruction.   Cirrhosis: last EGD 2020 without varices. INR stable. Significant thrombocytopenia (40,000). Followed by Dr. Therisa Doyne as outpatient and at [facility] liver center. Not a transplant candidate due to comorbidities. Decompensated after right AKA in 06/2019 (for failed TKR) with ascites, HE. Last LVAP 6 months ago. Small amount of ascites noted on current CT. Ammonia 100 two days ago but down to 33 today. MELD 19 on admission. Plans to resume lactulose, Xifaxan, and oral diuretics once able to take PO.  SBO: likely due to adhesions from prior surgery/radiation. NG in place. Patient is consider high risk for surgery with high perioperative mortality given her decompensated cirrhosis. If surgery required, plans to transfer to tertiary center.    Plan: 1. Management of bowel obstruction per surgery.  2. Resume oral lactulose, Xifaxan, home diuretics once taking  PO. 3. Monitor for signs of hepatic encephalopathy while off xifaxan and lactulose. Consider lactulose enemas if needed.  4. Will follow peripherally. No additional recommendations at this time.  Laureen Ochs. Bernarda Caffey [facility] [phone] 10/21/20219:02 AM     LOS: 2 days

## 2020-01-11 NOTE — Progress Notes (Signed)
PROGRESS NOTE    Brittany Russo  YJE:[id number] DOB: 02/20/1946 DOA: 01/08/2020 PCP: Rosalee Kaufman, PA-C   Brief Narrative:  74 year old female with a history of hypertension, diabetes, cirrhosis, right above-the-knee amputation admitted to the hospital with vomiting, loose stools and abdominal pain. Imaging performed in the emergency room indicated possible small bowel obstruction. She was admitted to the hospital for further management.  10/20: Patient continues to remain on NG tube for decompression of small bowel obstruction.  She is high risk for surgical management.  She denies any further nausea or vomiting or abdominal pain.  No flatus or bowel movement noted as of yet.  GI consultation pending for liver cirrhosis evaluation.  10/21: Patient did have bowel movement noted yesterday which was fairly large.  General surgery planning to check KUB and ensure improvement prior to removing NG tube and advancing diet.  She appears to be clinically improving slowly.  Assessment & Plan:   Principal Problem:   SBO (small bowel obstruction) (HCC) Active Problems:   DM2 (diabetes mellitus, type 2) (HCC)   Hypertension   S/P right TKA   Depression   Cirrhosis ([place])   Controlled type 2 diabetes mellitus with hyperglycemia, with long-term current use of insulin (HCC)   Thrombocytopenia (HCC)   Small bowel obstruction -Appreciate ongoing general surgery input. -NG tube placed for decompression -Continue conservative management -With her underlying liver disease, she would have a very high perioperative mortality risk -No flatus or bowel movement noted as of yet -Continue IV fluid and advance diet per general surgery once appropriate -Chloraseptic Spray added for sore throat and discomfort from NG tube -KUB on 10/21 pending prior to trial of clear liquids and removal of NG tube per general surgery  NASHcirrhosis -No overt varices noted on CT imaging -Resume lactulose and  Xifaxan once able to take p.o. -Monitor a.m. ammonia level -She is also chronically on Lasix and Aldactone which are currently on hold, plan to keep IV fluid for now  Type 2 diabetes -Chronically on Janumet, Lantus and NovoLog -Currently, these medications are on hold while n.p.o. -She is on sliding scale -Blood sugars currently stable -Hemoglobin A1c noted to be 8%  HTN -Chronically on atenolol -This is on hold while she is n.p.o. -Continue to monitor blood pressures -IV labetalol as needed  Thrombocytopenia-stable -Related to cirrhosis -Monitor for signs of bleeding  Hyponatremia-resolved -Continue ongoing normal saline infusion, especially while n.p.o. -Recheck a.m. labs   DVT prophylaxis:SCDs  Code Status:Full code Family Communication:Daughter was updated 10/21 Disposition Plan:Inpatient  Remains inpatient appropriate because:Inpatient level of care appropriate due to severity of illness   Dispo: The patient is from:Home Anticipated d/c is OV:ZCHY Anticipated d/c date is: 2-3 days Patient currently is not medically stable to d/c.  She still remains n.p.o. and has not had return of bowel function.   Consultants:  General surgery  Procedures:  NG tube placement 10/19  Antimicrobials:   None   Subjective: Patient seen and evaluated today with bowel movement noted yesterday as well as flatus.  No significant abdominal pain, nausea or vomiting noted.  Objective: Vitals:   01/10/20 1453 01/10/20 2016 01/10/20 2031 01/11/20 0549  BP: (!) 147/70 (!) 130/57  (!) 142/69  Pulse: 75 74  80  Resp: 16   20  Temp: 98.1 F (36.7 C) 98.4 F (36.9 C)  98 F (36.7 C)  TempSrc: Oral Oral  Oral  SpO2: 99% 99% 99% 98%  Weight:      Height:  No intake or output data in the 24 hours ending 01/11/20 1237 Filed Weights   01/08/20 1532 01/09/20 0120  Weight: 65.8 kg 58.6 kg     Examination:  General exam: Appears calm and comfortable  Respiratory system: Clear to auscultation. Respiratory effort normal. Cardiovascular system: S1 & S2 heard, RRR. Gastrointestinal system: Abdomen is nondistended, soft. NGT to suction. Central nervous system: Alert and oriented. No focal neurological deficits. Extremities: Symmetric 5 x 5 power. Skin: No rashes, lesions or ulcers Psychiatry: Judgement and insight appear normal. Mood & affect appropriate.     Data Reviewed: I have personally reviewed following labs and imaging studies  CBC: Recent Labs  Lab 01/08/20 1633 01/09/20 0609 01/10/20 0454 01/11/20 0333  WBC 5.6 7.3 5.8 4.6  NEUTROABS 4.3  --   --   --   HGB 13.4 12.7 12.4 11.9*  HCT 39.0 38.6 37.3 36.4  MCV 91.3 94.1 95.9 95.8  PLT 40* 43* 45* 40*   Basic Metabolic Panel: Recent Labs  Lab 01/08/20 1633 01/08/20 1911 01/09/20 0609 01/10/20 0454 01/11/20 0333  NA 126*  --  130* 134* 137  K 4.2  --  4.5 4.5 3.9  CL 90*  --  96* 100 104  CO2 24  --  24 22 20*  GLUCOSE 208*  --  138* 118* 142*  BUN 23  --  26* 30* 37*  CREATININE 0.99  --  1.01* 0.78 0.86  CALCIUM 9.5  --  9.0 8.9 8.9  MG  --  2.0  --   --  2.2   GFR: Estimated Creatinine Clearance: 47.5 mL/min (by C-G formula based on SCr of 0.86 mg/dL). Liver Function Tests: Recent Labs  Lab 01/08/20 1633 01/09/20 0911 01/10/20 0454 01/11/20 0333  AST 148* 120* 92* 87*  ALT 105* 98* 84* 84*  ALKPHOS 94 92 83 79  BILITOT 2.0* 3.1* 2.7* 3.6*  PROT 6.0* 5.8* 5.5* 5.4*  ALBUMIN 3.0* 2.9* 2.7* 2.8*   Recent Labs  Lab 01/08/20 1633  LIPASE 55*   Recent Labs  Lab 01/09/20 0911 01/11/20 0333  AMMONIA 100* 33   Coagulation Profile: Recent Labs  Lab 01/09/20 0859  INR 1.2   Cardiac Enzymes: No results for input(s): CKTOTAL, CKMB, CKMBINDEX, TROPONINI in the last 168 hours. BNP (last 3 results) No results for input(s): PROBNP in the last 8760 hours. HbA1C: Recent Labs     01/08/20 1911  HGBA1C 8.0*   CBG: Recent Labs  Lab 01/10/20 1135 01/10/20 1740 01/11/20 0029 01/11/20 0541 01/11/20 1150  GLUCAP 143* 124* 127* 147* 166*   Lipid Profile: No results for input(s): CHOL, HDL, LDLCALC, TRIG, CHOLHDL, LDLDIRECT in the last 72 hours. Thyroid Function Tests: No results for input(s): TSH, T4TOTAL, FREET4, T3FREE, THYROIDAB in the last 72 hours. Anemia Panel: No results for input(s): VITAMINB12, FOLATE, FERRITIN, TIBC, IRON, RETICCTPCT in the last 72 hours. Sepsis Labs: No results for input(s): PROCALCITON, LATICACIDVEN in the last 168 hours.  Recent Results (from the past 240 hour(s))  Resp Panel by RT PCR (RSV, Flu A&B, Covid) - Nasopharyngeal Swab     Status: None   Collection Time: 01/08/20  4:18 PM   Specimen: Nasopharyngeal Swab  Result Value Ref Range Status   SARS Coronavirus 2 by RT PCR NEGATIVE NEGATIVE Final    Comment: (NOTE) SARS-CoV-2 target nucleic acids are NOT DETECTED.  The SARS-CoV-2 RNA is generally detectable in upper respiratoy specimens during the acute phase of infection. The lowest concentration of SARS-CoV-2  viral copies this assay can detect is 131 copies/mL. A negative result does not preclude SARS-Cov-2 infection and should not be used as the sole basis for treatment or other patient management decisions. A negative result may occur with  improper specimen collection/handling, submission of specimen other than nasopharyngeal swab, presence of viral mutation(s) within the areas targeted by this assay, and inadequate number of viral copies (<131 copies/mL). A negative result must be combined with clinical observations, patient history, and epidemiological information. The expected result is Negative.  Fact Sheet for Patients:  PinkCheek.be  Fact Sheet for Healthcare Providers:  GravelBags.it  This test is no t yet approved or cleared by the [place] FDA  and  has been authorized for detection and/or diagnosis of SARS-CoV-2 by FDA under an Emergency Use Authorization (EUA). This EUA will remain  in effect (meaning this test can be used) for the duration of the COVID-19 declaration under Section 564(b)(1) of the Act, 21 U.S.C. section 360bbb-3(b)(1), unless the authorization is terminated or revoked sooner.     Influenza A by PCR NEGATIVE NEGATIVE Final   Influenza B by PCR NEGATIVE NEGATIVE Final    Comment: (NOTE) The Xpert Xpress SARS-CoV-2/FLU/RSV assay is intended as an aid in  the diagnosis of influenza from Nasopharyngeal swab specimens and  should not be used as a sole basis for treatment. Nasal washings and  aspirates are unacceptable for Xpert Xpress SARS-CoV-2/FLU/RSV  testing.  Fact Sheet for Patients: PinkCheek.be  Fact Sheet for Healthcare Providers: GravelBags.it  This test is not yet approved or cleared by the [place] FDA and  has been authorized for detection and/or diagnosis of SARS-CoV-2 by  FDA under an Emergency Use Authorization (EUA). This EUA will remain  in effect (meaning this test can be used) for the duration of the  Covid-19 declaration under Section 564(b)(1) of the Act, 21  U.S.C. section 360bbb-3(b)(1), unless the authorization is  terminated or revoked.    Respiratory Syncytial Virus by PCR NEGATIVE NEGATIVE Final    Comment: (NOTE) Fact Sheet for Patients: PinkCheek.be  Fact Sheet for Healthcare Providers: GravelBags.it  This test is not yet approved or cleared by the [place] FDA and  has been authorized for detection and/or diagnosis of SARS-CoV-2 by  FDA under an Emergency Use Authorization (EUA). This EUA will remain  in effect (meaning this test can be used) for the duration of the  COVID-19 declaration under Section 564(b)(1) of the Act, 21 U.S.C.  section  360bbb-3(b)(1), unless the authorization is terminated or  revoked. Performed at [facility], [address] [place], [place] [address]          Radiology Studies: DG CHEST PORT 1 VIEW  Result Date: 01/09/2020 CLINICAL DATA:  NG tube placement EXAM: PORTABLE CHEST 1 VIEW COMPARISON:  01/08/2020 FINDINGS: NG tube tip is in the proximal stomach with the side port in the distal esophagus. Lungs are clear. Heart is normal size. IMPRESSION: NG tube tip in the proximal stomach. Electronically Signed   By: Rolm Baptise M.D.   On: 01/09/2020 20:03        Scheduled Meds: . insulin aspart  0-9 Units Subcutaneous Q6H   Continuous Infusions: . sodium chloride 75 mL/hr at 01/10/20 1724     LOS: 2 days    Time spent: 30 minutes    Isaia Hassell Darleen Crocker, DO [facility]  If 7PM-7AM, please contact night-coverage [email or web address] 01/11/2020, 12:37 PM

## 2020-01-11 NOTE — Progress Notes (Signed)
Pt found leaning over the bed rail, increased respirations, facial grimacing, and stated she was nauseous PRN medication given, see mar. Pt also expressed she was ready to get out of here and she wanted the tube out of her nose. Pt stated pain to upper/lower abdomen, throat, and left shoulder. Vitals WNL, respirations decreased to 24. Will reassess. MD notified

## 2020-01-11 NOTE — Plan of Care (Signed)

## 2020-01-11 NOTE — Progress Notes (Signed)
MD advised next dose of PRN pain mediation can be given a early a 2145. Pt stated no decrease to 10/10 pain reported in abdomen, R ear, throat, and L shoulder. Medication administered, see mar.  Pt also reports no decrease in  nausea.

## 2020-01-12 ENCOUNTER — Inpatient Hospital Stay (HOSPITAL_COMMUNITY): Payer: Medicare Other

## 2020-01-12 DIAGNOSIS — K5651 Intestinal adhesions [bands], with partial obstruction: Secondary | ICD-10-CM | POA: Diagnosis present

## 2020-01-12 DIAGNOSIS — J019 Acute sinusitis, unspecified: Secondary | ICD-10-CM | POA: Diagnosis present

## 2020-01-12 DIAGNOSIS — J45909 Unspecified asthma, uncomplicated: Secondary | ICD-10-CM | POA: Diagnosis present

## 2020-01-12 DIAGNOSIS — K6389 Other specified diseases of intestine: Secondary | ICD-10-CM | POA: Diagnosis not present

## 2020-01-12 DIAGNOSIS — Z90710 Acquired absence of both cervix and uterus: Secondary | ICD-10-CM | POA: Diagnosis not present

## 2020-01-12 DIAGNOSIS — Z8542 Personal history of malignant neoplasm of other parts of uterus: Secondary | ICD-10-CM | POA: Diagnosis not present

## 2020-01-12 DIAGNOSIS — Z89611 Acquired absence of right leg above knee: Secondary | ICD-10-CM | POA: Diagnosis not present

## 2020-01-12 DIAGNOSIS — Z794 Long term (current) use of insulin: Secondary | ICD-10-CM | POA: Diagnosis not present

## 2020-01-12 DIAGNOSIS — E785 Hyperlipidemia, unspecified: Secondary | ICD-10-CM | POA: Diagnosis present

## 2020-01-12 DIAGNOSIS — R001 Bradycardia, unspecified: Secondary | ICD-10-CM | POA: Diagnosis not present

## 2020-01-12 DIAGNOSIS — R600 Localized edema: Secondary | ICD-10-CM | POA: Diagnosis not present

## 2020-01-12 DIAGNOSIS — E119 Type 2 diabetes mellitus without complications: Secondary | ICD-10-CM | POA: Diagnosis present

## 2020-01-12 DIAGNOSIS — I10 Essential (primary) hypertension: Secondary | ICD-10-CM | POA: Diagnosis present

## 2020-01-12 DIAGNOSIS — K7581 Nonalcoholic steatohepatitis (NASH): Secondary | ICD-10-CM | POA: Diagnosis present

## 2020-01-12 DIAGNOSIS — F419 Anxiety disorder, unspecified: Secondary | ICD-10-CM | POA: Diagnosis present

## 2020-01-12 DIAGNOSIS — K56609 Unspecified intestinal obstruction, unspecified as to partial versus complete obstruction: Secondary | ICD-10-CM | POA: Diagnosis not present

## 2020-01-12 DIAGNOSIS — Z7401 Bed confinement status: Secondary | ICD-10-CM | POA: Diagnosis not present

## 2020-01-12 DIAGNOSIS — F32A Depression, unspecified: Secondary | ICD-10-CM | POA: Diagnosis present

## 2020-01-12 DIAGNOSIS — K7460 Unspecified cirrhosis of liver: Secondary | ICD-10-CM | POA: Diagnosis present

## 2020-01-12 DIAGNOSIS — D509 Iron deficiency anemia, unspecified: Secondary | ICD-10-CM | POA: Diagnosis present

## 2020-01-12 DIAGNOSIS — M2550 Pain in unspecified joint: Secondary | ICD-10-CM | POA: Diagnosis not present

## 2020-01-12 DIAGNOSIS — K56600 Partial intestinal obstruction, unspecified as to cause: Secondary | ICD-10-CM | POA: Diagnosis not present

## 2020-01-12 DIAGNOSIS — Z923 Personal history of irradiation: Secondary | ICD-10-CM | POA: Diagnosis not present

## 2020-01-12 LAB — BASIC METABOLIC PANEL WITH GFR
Anion gap: 9 (ref 5–15)
BUN: 33 mg/dL — ABNORMAL HIGH (ref 8–23)
CO2: 19 mmol/L — ABNORMAL LOW (ref 22–32)
Calcium: 8.2 mg/dL — ABNORMAL LOW (ref 8.9–10.3)
Chloride: 110 mmol/L (ref 98–111)
Creatinine, Ser: 0.59 mg/dL (ref 0.44–1.00)
GFR, Estimated: >60 mL/min
Glucose, Bld: 99 mg/dL (ref 70–99)
Potassium: 3.6 mmol/L (ref 3.5–5.1)
Sodium: 138 mmol/L (ref 135–145)

## 2020-01-12 LAB — GLUCOSE, CAPILLARY
Glucose-Capillary: 111 mg/dL — ABNORMAL HIGH (ref 70–99)
Glucose-Capillary: 89 mg/dL (ref 70–99)
Glucose-Capillary: 92 mg/dL (ref 70–99)

## 2020-01-12 LAB — MAGNESIUM: Magnesium: 1.9 mg/dL (ref 1.7–2.4)

## 2020-01-12 MED ORDER — LACTATED RINGERS IV SOLN: INTRAVENOUS | Status: DC

## 2020-01-12 MED ORDER — KCL-LACTATED RINGERS-D5W 20 MEQ/L IV SOLN
INTRAVENOUS | Status: DC
  Filled 2020-01-12 (×6): qty 1000

## 2020-01-12 NOTE — Progress Notes (Signed)
[facility] Progress Note     Subjective: Patient with continued pain overnight. NG without about 1L in the canister. No further BM. Some intermittent flatus.   Objective: Vital signs in last 24 hours: Temp:  [97.9 F (36.6 C)-98.1 F (36.7 C)] 98.1 F (36.7 C) (10/22 0607) Pulse Rate:  [67-74] 74 (10/22 0607) Resp:  [18-24] 18 (10/22 0607) BP: (129-146)/(59-64) 129/59 (10/22 0607) SpO2:  [99 %-100 %] 99 % (10/22 0607)    Intake/Output from previous day: 10/21 0701 - 10/22 0700 In: 3423.5 [I.V.:3423.5] Out: 200 [Urine:200] Intake/Output this shift: No intake/output data recorded.  General appearance: alert, cooperative and mild distress Resp: normal work of breathing GI: soft, distended, tender with deep palpation no rebound or guarding   Lab Results:  Recent Labs    01/10/20 0454 01/11/20 0333  WBC 5.8 4.6  HGB 12.4 11.9*  HCT 37.3 36.4  PLT 45* 40*   BMET Recent Labs    01/11/20 0333 01/12/20 0339  NA 137 138  K 3.9 3.6  CL 104 110  CO2 20* 19*  GLUCOSE 142* 99  BUN 37* 33*  CREATININE 0.86 0.59  CALCIUM 8.9 8.2*   PT/INR No results for input(s): LABPROT, INR in the last 72 hours.  Studies/Results: DG Abd 1 View  Result Date: 01/12/2020 CLINICAL DATA:  Small-bowel obstruction, abdominal pain EXAM: ABDOMEN - 1 VIEW COMPARISON:  01/11/2020 FINDINGS: Nasogastric tube terminates within the proximal stomach just beyond the GE junction. Side port present within the distal esophagus. Persistently dilated loops of small bowel throughout the abdomen measuring up to 4.0 cm in diameter, similar to prior. No gross free intraperitoneal air on portable supine view. There is paucity of gas within the colon. Degenerative changes of the bilateral hips. IMPRESSION: 1. Persistently dilated loops of small bowel throughout the abdomen measuring up to 4.0 cm in diameter, similar to prior. 2. Nasogastric tube terminates within the proximal stomach just beyond  the GE junction. Side port present within the distal esophagus. Recommend advancement 7-10 cm. Electronically Signed   By: Davina Poke D.O.   On: 01/12/2020 08:18   DG Abd 1 View  Result Date: 01/11/2020 CLINICAL DATA:  Small-bowel obstruction EXAM: ABDOMEN - 1 VIEW COMPARISON:  Portable exam 1146 hours compared to 12/02/2018 FINDINGS: Tip of nasogastric tube projects over proximal stomach with proximal side-port at approximately the gastroesophageal junction. Dilated loops of small bowel in the upper and mid abdomen consistent with small bowel obstruction. Paucity of colonic gas. Bones demineralized. IMPRESSION: Increased small bowel dilatation since prior exam consistent with small bowel obstruction. Electronically Signed   By: Lavonia Dana M.D.   On: 01/11/2020 13:22    Anti-infectives: Anti-infectives (From admission, onward)   None      Assessment/Plan: Brittany Russo is a 74 yo who has NASH cirrhosis with MELD 16-19 and Child Pugh C at admission. She remains obstructed. I am afraid she will possibly need surgery and this will need to be done at a tertiary care center. I have called [facility] and Spoke with Dr. Johney Maine who has accepted the patient in transfer to the EGS service.   PACS radiology images are being sent to [facility]. NPO, NG Family updated by Dr. Manuella Ghazi. I spoke with son at bedside.   Appreciate everyone's assistance on this difficult patient.    LOS: 3 days    Brittany Russo 01/12/2020

## 2020-01-12 NOTE — Progress Notes (Signed)
Report given to Caryl Pina, Therapist, sports at [facility].

## 2020-01-12 NOTE — Progress Notes (Signed)
PROGRESS NOTE    Brittany Russo  IHK:[id number] DOB: 06-01-1945 DOA: 01/08/2020 PCP: Rosalee Kaufman, PA-C   Brief Narrative:  74 year old female with a history of hypertension, diabetes, cirrhosis, right above-the-knee amputation admitted to the hospital with vomiting, loose stools and abdominal pain. Imaging performed in the emergency room indicated possible small bowel obstruction. She was admitted to the hospital for further management.  10/20:Patient continues to remain on NG tube for decompression of small bowel obstruction. She is high risk for surgical management. She denies any further nausea or vomiting or abdominal pain. No flatus or bowel movement noted as of yet. GI consultation pending for liver cirrhosis evaluation.  10/21: Patient did have bowel movement noted yesterday which was fairly large.  General surgery planning to check KUB and ensure improvement prior to removing NG tube and advancing diet.  She appears to be clinically improving slowly.  10/22: Patient continues have some ongoing abdominal pain with x-rays demonstrating persistent bowel loop dilation.  General surgery recommending transfer with calls to be made with hopeful transfer soon for more definitive management.  Assessment & Plan:   Principal Problem:   SBO (small bowel obstruction) (HCC) Active Problems:   DM2 (diabetes mellitus, type 2) ([place])   Hypertension   S/P right TKA   Depression   Cirrhosis ([place])   Controlled type 2 diabetes mellitus with hyperglycemia, with long-term current use of insulin (HCC)   Thrombocytopenia (HCC)   Small bowel obstruction -Appreciateongoinggeneral surgery input. -NG tube placed for decompression -Continue conservative management -With her underlying liver disease, she would have a very high perioperative mortality risk -No flatus or bowel movement noted as of yet -Continue IV fluid and advance diet per general surgery once  appropriate -Chloraseptic Spray added for sore throat and discomfort from NG tube -KUB on 10/21 and 10/22 with persistent bowel loop dilation noted and need for ongoing NG tube requirement.  NASHcirrhosis -No overt varices noted on CT imaging -Resume lactulose and Xifaxan once able to take p.o. -Monitor a.m. ammonia level -She is also chronically on Lasix and Aldactone which are currently on hold,plan to keep IV fluid for now  Type 2 diabetes -Chronically on Janumet, Lantus and NovoLog -Currently, these medications are on hold while n.p.o. -She is on sliding scale -Blood sugars currently stable -Hemoglobin A1c noted to be 8%  HTN -Chronically on atenolol -This is on hold while she is n.p.o. -Continue to monitor blood pressures -IV labetalol as needed  Thrombocytopenia-stable -Related to cirrhosis -Monitor for signs of bleeding  Hyponatremia-resolved -Continue ongoing infusion currently now with d5LR  -Recheck a.m. labs   DVT prophylaxis:SCDs  Code Status:Full code Family Communication:Daughter was updated10/22 Disposition Plan:Inpatient  Remains inpatient appropriate because:Inpatient level of care appropriate due to severity of illness   Dispo: The patient is from:Home Anticipated d/c is VF:IEPPIRJJ to tertiary care facility Anticipated d/c date is:2-3 days Patient currently is not medically stable to d/c.She still remains n.p.o. and has not had return of bowel function.  Plan is to try and transfer to tertiary care facility.   Consultants:  General surgery  Procedures:  NG tube placement10/19  Antimicrobials:   None  Subjective: Patient seen and evaluated today with some flatus noted, but with abdominal pain and NG tube discomfort.  She was also noted to have some nausea.  Objective: Vitals:   01/11/20 0549 01/11/20 1352 01/11/20 2031 01/12/20 0607  BP: (!) 142/69 136/61 (!) 146/64  (!) 129/59  Pulse: 80 83 67 74  Resp:  20 16 (!) 24 18  Temp: 98 F (36.7 C) 97.7 F (36.5 C) 97.9 F (36.6 C) 98.1 F (36.7 C)  TempSrc: Oral Axillary Oral Oral  SpO2: 98% 99% 100% 99%  Weight:      Height:        Intake/Output Summary (Last 24 hours) at 01/12/2020 1152 Last data filed at 01/12/2020 0458 Gross per 24 hour  Intake 3423.45 ml  Output 200 ml  Net 3223.45 ml   Filed Weights   01/08/20 1532 01/09/20 0120  Weight: 65.8 kg 58.6 kg    Examination:  General exam: Appears calm and comfortable  Respiratory system: Clear to auscultation. Respiratory effort normal. Cardiovascular system: S1 & S2 heard, RRR.  Gastrointestinal system: Abdomen is nondistended, soft and nontender.  NG tube to low intermittent suction. Central nervous system: Alert and awake Extremities: No edema Skin: No rashes, lesions or ulcers Psychiatry: Flat affect    Data Reviewed: I have personally reviewed following labs and imaging studies  CBC: Recent Labs  Lab 01/08/20 1633 01/09/20 0609 01/10/20 0454 01/11/20 0333  WBC 5.6 7.3 5.8 4.6  NEUTROABS 4.3  --   --   --   HGB 13.4 12.7 12.4 11.9*  HCT 39.0 38.6 37.3 36.4  MCV 91.3 94.1 95.9 95.8  PLT 40* 43* 45* 40*   Basic Metabolic Panel: Recent Labs  Lab 01/08/20 1633 01/08/20 1911 01/09/20 0609 01/10/20 0454 01/11/20 0333 01/12/20 0339  NA 126*  --  130* 134* 137 138  K 4.2  --  4.5 4.5 3.9 3.6  CL 90*  --  96* 100 104 110  CO2 24  --  24 22 20* 19*  GLUCOSE 208*  --  138* 118* 142* 99  BUN 23  --  26* 30* 37* 33*  CREATININE 0.99  --  1.01* 0.78 0.86 0.59  CALCIUM 9.5  --  9.0 8.9 8.9 8.2*  MG  --  2.0  --   --  2.2 1.9   GFR: Estimated Creatinine Clearance: 51 mL/min (by C-G formula based on SCr of 0.59 mg/dL). Liver Function Tests: Recent Labs  Lab 01/08/20 1633 01/09/20 0911 01/10/20 0454 01/11/20 0333  AST 148* 120* 92* 87*  ALT 105* 98* 84* 84*  ALKPHOS 94 92 83 79  BILITOT 2.0* 3.1* 2.7* 3.6*  PROT  6.0* 5.8* 5.5* 5.4*  ALBUMIN 3.0* 2.9* 2.7* 2.8*   Recent Labs  Lab 01/08/20 1633  LIPASE 55*   Recent Labs  Lab 01/09/20 0911 01/11/20 0333  AMMONIA 100* 33   Coagulation Profile: Recent Labs  Lab 01/09/20 0859  INR 1.2   Cardiac Enzymes: No results for input(s): CKTOTAL, CKMB, CKMBINDEX, TROPONINI in the last 168 hours. BNP (last 3 results) No results for input(s): PROBNP in the last 8760 hours. HbA1C: No results for input(s): HGBA1C in the last 72 hours. CBG: Recent Labs  Lab 01/11/20 0541 01/11/20 1150 01/11/20 1814 01/12/20 0003 01/12/20 0618  GLUCAP 147* 166* 116* 89 92   Lipid Profile: No results for input(s): CHOL, HDL, LDLCALC, TRIG, CHOLHDL, LDLDIRECT in the last 72 hours. Thyroid Function Tests: No results for input(s): TSH, T4TOTAL, FREET4, T3FREE, THYROIDAB in the last 72 hours. Anemia Panel: No results for input(s): VITAMINB12, FOLATE, FERRITIN, TIBC, IRON, RETICCTPCT in the last 72 hours. Sepsis Labs: No results for input(s): PROCALCITON, LATICACIDVEN in the last 168 hours.  Recent Results (from the past 240 hour(s))  Resp Panel by RT PCR (RSV, Flu A&B, Covid) - Nasopharyngeal Swab  Status: None   Collection Time: 01/08/20  4:18 PM   Specimen: Nasopharyngeal Swab  Result Value Ref Range Status   SARS Coronavirus 2 by RT PCR NEGATIVE NEGATIVE Final    Comment: (NOTE) SARS-CoV-2 target nucleic acids are NOT DETECTED.  The SARS-CoV-2 RNA is generally detectable in upper respiratoy specimens during the acute phase of infection. The lowest concentration of SARS-CoV-2 viral copies this assay can detect is 131 copies/mL. A negative result does not preclude SARS-Cov-2 infection and should not be used as the sole basis for treatment or other patient management decisions. A negative result may occur with  improper specimen collection/handling, submission of specimen other than nasopharyngeal swab, presence of viral mutation(s) within the areas  targeted by this assay, and inadequate number of viral copies (<131 copies/mL). A negative result must be combined with clinical observations, patient history, and epidemiological information. The expected result is Negative.  Fact Sheet for Patients:  PinkCheek.be  Fact Sheet for Healthcare Providers:  GravelBags.it  This test is no t yet approved or cleared by the [place] FDA and  has been authorized for detection and/or diagnosis of SARS-CoV-2 by FDA under an Emergency Use Authorization (EUA). This EUA will remain  in effect (meaning this test can be used) for the duration of the COVID-19 declaration under Section 564(b)(1) of the Act, 21 U.S.C. section 360bbb-3(b)(1), unless the authorization is terminated or revoked sooner.     Influenza A by PCR NEGATIVE NEGATIVE Final   Influenza B by PCR NEGATIVE NEGATIVE Final    Comment: (NOTE) The Xpert Xpress SARS-CoV-2/FLU/RSV assay is intended as an aid in  the diagnosis of influenza from Nasopharyngeal swab specimens and  should not be used as a sole basis for treatment. Nasal washings and  aspirates are unacceptable for Xpert Xpress SARS-CoV-2/FLU/RSV  testing.  Fact Sheet for Patients: PinkCheek.be  Fact Sheet for Healthcare Providers: GravelBags.it  This test is not yet approved or cleared by the [place] FDA and  has been authorized for detection and/or diagnosis of SARS-CoV-2 by  FDA under an Emergency Use Authorization (EUA). This EUA will remain  in effect (meaning this test can be used) for the duration of the  Covid-19 declaration under Section 564(b)(1) of the Act, 21  U.S.C. section 360bbb-3(b)(1), unless the authorization is  terminated or revoked.    Respiratory Syncytial Virus by PCR NEGATIVE NEGATIVE Final    Comment: (NOTE) Fact Sheet for  Patients: PinkCheek.be  Fact Sheet for Healthcare Providers: GravelBags.it  This test is not yet approved or cleared by the [place] FDA and  has been authorized for detection and/or diagnosis of SARS-CoV-2 by  FDA under an Emergency Use Authorization (EUA). This EUA will remain  in effect (meaning this test can be used) for the duration of the  COVID-19 declaration under Section 564(b)(1) of the Act, 21 U.S.C.  section 360bbb-3(b)(1), unless the authorization is terminated or  revoked. Performed at [facility], [address], [place], [place] [address]          Radiology Studies: DG Abd 1 View  Result Date: 01/12/2020 CLINICAL DATA:  Small-bowel obstruction, abdominal pain EXAM: ABDOMEN - 1 VIEW COMPARISON:  01/11/2020 FINDINGS: Nasogastric tube terminates within the proximal stomach just beyond the GE junction. Side port present within the distal esophagus. Persistently dilated loops of small bowel throughout the abdomen measuring up to 4.0 cm in diameter, similar to prior. No gross free intraperitoneal air on portable supine view. There is paucity of gas  within the colon. Degenerative changes of the bilateral hips. IMPRESSION: 1. Persistently dilated loops of small bowel throughout the abdomen measuring up to 4.0 cm in diameter, similar to prior. 2. Nasogastric tube terminates within the proximal stomach just beyond the GE junction. Side port present within the distal esophagus. Recommend advancement 7-10 cm. Electronically Signed   By: Davina Poke D.O.   On: 01/12/2020 08:18   DG Abd 1 View  Result Date: 01/11/2020 CLINICAL DATA:  Small-bowel obstruction EXAM: ABDOMEN - 1 VIEW COMPARISON:  Portable exam 1146 hours compared to 12/02/2018 FINDINGS: Tip of nasogastric tube projects over proximal stomach with proximal side-port at approximately the gastroesophageal junction. Dilated loops of small bowel in the upper and  mid abdomen consistent with small bowel obstruction. Paucity of colonic gas. Bones demineralized. IMPRESSION: Increased small bowel dilatation since prior exam consistent with small bowel obstruction. Electronically Signed   By: Lavonia Dana M.D.   On: 01/11/2020 13:22        Scheduled Meds: . insulin aspart  0-9 Units Subcutaneous Q6H   Continuous Infusions: . lactated ringers       LOS: 3 days    Time spent: 40 minutes    Danise Dehne Darleen Crocker, DO [facility]  If 7PM-7AM, please contact night-coverage [email or web address] 01/12/2020, 11:52 AM

## 2020-01-12 NOTE — Plan of Care (Signed)

## 2020-01-14 NOTE — Discharge Summary (Signed)
Physician Discharge Summary  Brittany Russo ZOX:[id number] DOB: Apr 26, 1945 DOA: 01/08/2020  PCP: Rosalee Kaufman, PA-C  Admit date: 01/08/2020  Transfer date: 01/12/2020  Admitted From:Home  Disposition:  [facility]  Discharge Condition:Stable  CODE STATUS: Full  Diet recommendation: NPO  Brief/Interim Summary: 74 year old female with a history of hypertension, diabetes, cirrhosis, right above-the-knee amputation admitted to the hospital with vomiting, loose stools and abdominal pain. Imaging performed in the emergency room indicated possible small bowel obstruction. She was admitted to the hospital for further management.  10/20:Patient continues to remain on NG tube for decompression of small bowel obstruction. She is high risk for surgical management. She denies any further nausea or vomiting or abdominal pain. No flatus or bowel movement noted as of yet. GI consultation pending for liver cirrhosis evaluation.  10/21:Patient did have bowel movement noted yesterday which was fairly large. General surgery planning to check KUB and ensure improvement prior to removing NG tube and advancing diet. She appears to be clinically improving slowly.  10/22: Patient continues have some ongoing abdominal pain with x-rays demonstrating persistent bowel loop dilation.    Patient continued to have ongoing symptoms for which she was recommended transfer to [facility] with accepting physician Dr. Johney Maine.  This was arranged by general surgery and patient will remain on the surgical service at that facility.  No other acute events noted during the course of this admission.  Discharge Diagnoses:  Principal Problem:   SBO (small bowel obstruction) (HCC) Active Problems:   DM2 (diabetes mellitus, type 2) ([place])   Hypertension   S/P right TKA   Depression   Cirrhosis ([place])   Controlled type 2 diabetes mellitus with hyperglycemia, with long-term current use of insulin (HCC)    Thrombocytopenia ([place])  Principal discharge diagnosis: Small bowel obstruction.  Discharge Instructions   Allergies as of 01/12/2020      Reactions   Asa [aspirin] Other (See Comments)   Pt not allergic but cannot take due to bleeding    Other Other (See Comments)   Any jewelry metal-hives and itching       Medication List    TAKE these medications   acetaminophen 325 MG tablet Commonly known as: TYLENOL Take 1-2 tablets (325-650 mg total) by mouth every 4 (four) hours as needed for mild pain.   ALPRAZolam 0.25 MG tablet Commonly known as: XANAX Take 1 tablet (0.25 mg total) by mouth 2 (two) times daily.   atenolol 25 MG tablet Commonly known as: TENORMIN Take 2 tablets (50 mg total) by mouth every morning.   atorvastatin 80 MG tablet Commonly known as: LIPITOR Take 80 mg by mouth daily at 8 pm.   Basaglar KwikPen 100 UNIT/ML Inject 25 Units into the skin See admin instructions. Inject 25 units if sugar is mid 100's.   buPROPion 150 MG 24 hr tablet Commonly known as: WELLBUTRIN XL Take 150 mg by mouth in the morning.   Enulose 10 GM/15ML Soln Generic drug: lactulose (encephalopathy) Take 30 g by mouth daily.   escitalopram 20 MG tablet Commonly known as: LEXAPRO Take 20 mg by mouth in the morning.   ferrous sulfate 325 (65 FE) MG tablet Commonly known as: FerrouSul Take 1 tablet (325 mg total) by mouth 3 (three) times daily with meals. What changed: when to take this   fluticasone 50 MCG/ACT nasal spray Commonly known as: FLONASE Place 1 spray into both nostrils in the morning.   furosemide 20 MG tablet Commonly known as: LASIX  Take 1 tablet (20 mg total) by mouth 2 (two) times daily.   gabapentin 300 MG capsule Commonly known as: NEURONTIN Take 1 capsule (300 mg total) by mouth 2 (two) times daily. What changed:   when to take this  reasons to take this   lactulose 10 GM/15ML solution Commonly known as: CHRONULAC Take 22.5 mLs (15 g total) by  mouth 2 (two) times daily. What changed:   how much to take  when to take this   lip balm ointment Apply topically as needed for lip care.   loratadine 10 MG tablet Commonly known as: CLARITIN Take 10 mg by mouth in the morning.   mesalamine 1000 MG suppository Commonly known as: CANASA Place 1 suppository rectally at bedtime.   multivitamin with minerals Tabs tablet Take 1 tablet by mouth daily.   NovoLOG FlexPen 100 UNIT/ML FlexPen Generic drug: insulin aspart Inject 4 Units into the skin in the morning and at bedtime. Can be up to 8 units twice a day depending on blood sugar. Take mid afternoon only if bloodsugar  is over 180  Takes in evening if it is 200 or over give up to 10 units per daughter   pantoprazole 40 MG tablet Commonly known as: PROTONIX Take 1 tablet (40 mg total) by mouth daily.   potassium chloride 10 MEQ tablet Commonly known as: KLOR-CON Take 1 tablet (10 mEq total) by mouth daily. What changed: when to take this   rifaximin 550 MG Tabs tablet Commonly known as: XIFAXAN Take 550 mg by mouth 2 (two) times daily.   sitaGLIPtin-metformin 50-500 MG tablet Commonly known as: JANUMET Take 1 tablet by mouth 2 (two) times daily with a meal.   spironolactone 100 MG tablet Commonly known as: ALDACTONE Take 1 tablet (100 mg total) by mouth daily. What changed: when to take this   traMADol 50 MG tablet Commonly known as: ULTRAM Take 1 tablet (50 mg total) by mouth every 6 (six) hours as needed for moderate pain.   traZODone 50 MG tablet Commonly known as: DESYREL Take 50 mg by mouth at bedtime as needed for sleep.   vitamin B-12 500 MCG tablet Commonly known as: CYANOCOBALAMIN Take 1 tablet (500 mcg total) by mouth daily.       Allergies  Allergen Reactions  . Asa [Aspirin] Other (See Comments)    Pt not allergic but cannot take due to bleeding   . Other Other (See Comments)    Any jewelry metal-hives and itching      Consultations: General surgery  Procedures/Studies: DG Abd 1 View  Result Date: 01/12/2020 CLINICAL DATA:  Small-bowel obstruction, abdominal pain EXAM: ABDOMEN - 1 VIEW COMPARISON:  01/11/2020 FINDINGS: Nasogastric tube terminates within the proximal stomach just beyond the GE junction. Side port present within the distal esophagus. Persistently dilated loops of small bowel throughout the abdomen measuring up to 4.0 cm in diameter, similar to prior. No gross free intraperitoneal air on portable supine view. There is paucity of gas within the colon. Degenerative changes of the bilateral hips. IMPRESSION: 1. Persistently dilated loops of small bowel throughout the abdomen measuring up to 4.0 cm in diameter, similar to prior. 2. Nasogastric tube terminates within the proximal stomach just beyond the GE junction. Side port present within the distal esophagus. Recommend advancement 7-10 cm. Electronically Signed   By: Davina Poke D.O.   On: 01/12/2020 08:18   DG Abd 1 View  Result Date: 01/11/2020 CLINICAL DATA:  Small-bowel obstruction EXAM: ABDOMEN -  1 VIEW COMPARISON:  Portable exam 1146 hours compared to 12/02/2018 FINDINGS: Tip of nasogastric tube projects over proximal stomach with proximal side-port at approximately the gastroesophageal junction. Dilated loops of small bowel in the upper and mid abdomen consistent with small bowel obstruction. Paucity of colonic gas. Bones demineralized. IMPRESSION: Increased small bowel dilatation since prior exam consistent with small bowel obstruction. Electronically Signed   By: Lavonia Dana M.D.   On: 01/11/2020 13:22   CT ABDOMEN PELVIS W CONTRAST  Result Date: 01/08/2020 CLINICAL DATA:  Recent diagnosis of UTI. Increasing abdominal pain, nausea, vomiting EXAM: CT ABDOMEN AND PELVIS WITH CONTRAST TECHNIQUE: Multidetector CT imaging of the abdomen and pelvis was performed using the standard protocol following bolus administration of intravenous  contrast. CONTRAST:  158m OMNIPAQUE IOHEXOL 300 MG/ML  SOLN COMPARISON:  09/07/2019 FINDINGS: Lower chest: Cardiomegaly. Coronary artery and aortic atherosclerosis. Trace right pleural effusion. No confluent opacities. Hepatobiliary: Nodular contour of the liver compatible with cirrhosis. Recanalization of the umbilical vein and spontaneous left splenorenal shunt compatible with portal venous hypertension. No focal hepatic abnormality. Gallbladder unremarkable. Pancreas: No focal abnormality or ductal dilatation. Spleen: Spleen is upper limits and normal in size with a craniocaudal length of 12.7 cm. Scattered calcifications compatible with old granulomatous disease. Adrenals/Urinary Tract: No adrenal abnormality. No focal renal abnormality. No stones or hydronephrosis. Urinary bladder is unremarkable. Stomach/Bowel: Appendix is slightly prominent 8 8 mm but no surrounding inflammation. Large bowel is decompressed and grossly unremarkable. There are dilated small bowel loops with air-fluid levels. Distal small bowel is decompressed. Findings concerning for distal small bowel obstruction. Exact transition or cause not visualized. Vascular/Lymphatic: Aortic atherosclerosis. No evidence of aneurysm or adenopathy. Reproductive: Prior hysterectomy.  No adnexal masses. Other: Small amount of free fluid adjacent to the liver and in the cul-de-sac. Musculoskeletal: No acute bony abnormality. IMPRESSION: Changes of cirrhosis with evidence of portal venous hypertension. Borderline spleen size. Small amount of ascites. Dilated proximal and mid small bowel loops. Distal small bowel is decompressed. Findings concerning for mid to distal small bowel obstruction. Exact transition not visualized. Aortic atherosclerosis. Electronically Signed   By: KRolm BaptiseM.D.   On: 01/08/2020 18:47   DG CHEST PORT 1 VIEW  Result Date: 01/09/2020 CLINICAL DATA:  NG tube placement EXAM: PORTABLE CHEST 1 VIEW COMPARISON:  01/08/2020  FINDINGS: NG tube tip is in the proximal stomach with the side port in the distal esophagus. Lungs are clear. Heart is normal size. IMPRESSION: NG tube tip in the proximal stomach. Electronically Signed   By: KRolm BaptiseM.D.   On: 01/09/2020 20:03   DG Chest Port 1 View  Result Date: 01/08/2020 CLINICAL DATA:  Chest pain EXAM: PORTABLE CHEST 1 VIEW COMPARISON:  09/06/2019 FINDINGS: No focal opacity or pleural effusion. Stable enlarged cardiomediastinal silhouette with aortic atherosclerosis. No pneumothorax. Streaky atelectasis left base. IMPRESSION: No active disease. Electronically Signed   By: KDonavan FoilM.D.   On: 01/08/2020 17:22     Discharge Exam: Vitals:   01/12/20 1528 01/12/20 1550  BP: (!) 162/73 (!) 162/73  Pulse: 76 76  Resp:  16  Temp:  (!) 97.3 F (36.3 C)  SpO2: 97% 97%   Vitals:   01/12/20 0607 01/12/20 1425 01/12/20 1528 01/12/20 1550  BP: (!) 129/59 133/63 (!) 162/73 (!) 162/73  Pulse: 74 83 76 76  Resp: 18 14  16   Temp: 98.1 F (36.7 C) 97.8 F (36.6 C)  (!) 97.3 F (36.3 C)  TempSrc: Oral Oral  SpO2: 99% 99% 97% 97%  Weight:      Height:        General: Pt is alert, awake, not in acute distress Cardiovascular: RRR, S1/S2 +, no rubs, no gallops Respiratory: CTA bilaterally, no wheezing, no rhonchi Abdominal: Soft, NG tube to suction present Extremities: no edema, no cyanosis    The results of significant diagnostics from this hospitalization (including imaging, microbiology, ancillary and laboratory) are listed below for reference.     Microbiology: Recent Results (from the past 240 hour(s))  Resp Panel by RT PCR (RSV, Flu A&B, Covid) - Nasopharyngeal Swab     Status: None   Collection Time: 01/08/20  4:18 PM   Specimen: Nasopharyngeal Swab  Result Value Ref Range Status   SARS Coronavirus 2 by RT PCR NEGATIVE NEGATIVE Final    Comment: (NOTE) SARS-CoV-2 target nucleic acids are NOT DETECTED.  The SARS-CoV-2 RNA is generally detectable  in upper respiratoy specimens during the acute phase of infection. The lowest concentration of SARS-CoV-2 viral copies this assay can detect is 131 copies/mL. A negative result does not preclude SARS-Cov-2 infection and should not be used as the sole basis for treatment or other patient management decisions. A negative result may occur with  improper specimen collection/handling, submission of specimen other than nasopharyngeal swab, presence of viral mutation(s) within the areas targeted by this assay, and inadequate number of viral copies (<131 copies/mL). A negative result must be combined with clinical observations, patient history, and epidemiological information. The expected result is Negative.  Fact Sheet for Patients:  PinkCheek.be  Fact Sheet for Healthcare Providers:  GravelBags.it  This test is no t yet approved or cleared by the [place] FDA and  has been authorized for detection and/or diagnosis of SARS-CoV-2 by FDA under an Emergency Use Authorization (EUA). This EUA will remain  in effect (meaning this test can be used) for the duration of the COVID-19 declaration under Section 564(b)(1) of the Act, 21 U.S.C. section 360bbb-3(b)(1), unless the authorization is terminated or revoked sooner.     Influenza A by PCR NEGATIVE NEGATIVE Final   Influenza B by PCR NEGATIVE NEGATIVE Final    Comment: (NOTE) The Xpert Xpress SARS-CoV-2/FLU/RSV assay is intended as an aid in  the diagnosis of influenza from Nasopharyngeal swab specimens and  should not be used as a sole basis for treatment. Nasal washings and  aspirates are unacceptable for Xpert Xpress SARS-CoV-2/FLU/RSV  testing.  Fact Sheet for Patients: PinkCheek.be  Fact Sheet for Healthcare Providers: GravelBags.it  This test is not yet approved or cleared by the [place] FDA and  has been  authorized for detection and/or diagnosis of SARS-CoV-2 by  FDA under an Emergency Use Authorization (EUA). This EUA will remain  in effect (meaning this test can be used) for the duration of the  Covid-19 declaration under Section 564(b)(1) of the Act, 21  U.S.C. section 360bbb-3(b)(1), unless the authorization is  terminated or revoked.    Respiratory Syncytial Virus by PCR NEGATIVE NEGATIVE Final    Comment: (NOTE) Fact Sheet for Patients: PinkCheek.be  Fact Sheet for Healthcare Providers: GravelBags.it  This test is not yet approved or cleared by the [place] FDA and  has been authorized for detection and/or diagnosis of SARS-CoV-2 by  FDA under an Emergency Use Authorization (EUA). This EUA will remain  in effect (meaning this test can be used) for the duration of the  COVID-19 declaration under Section 564(b)(1) of the Act, 21 U.S.C.  section  360bbb-3(b)(1), unless the authorization is terminated or  revoked. Performed at [facility], [address], [place], [place] [address]      Labs: BNP (last 3 results) No results for input(s): BNP in the last 8760 hours. Basic Metabolic Panel: Recent Labs  Lab 01/08/20 1633 01/08/20 1911 01/09/20 0609 01/10/20 0454 01/11/20 0333 01/12/20 0339  NA 126*  --  130* 134* 137 138  K 4.2  --  4.5 4.5 3.9 3.6  CL 90*  --  96* 100 104 110  CO2 24  --  24 22 20* 19*  GLUCOSE 208*  --  138* 118* 142* 99  BUN 23  --  26* 30* 37* 33*  CREATININE 0.99  --  1.01* 0.78 0.86 0.59  CALCIUM 9.5  --  9.0 8.9 8.9 8.2*  MG  --  2.0  --   --  2.2 1.9   Liver Function Tests: Recent Labs  Lab 01/08/20 1633 01/09/20 0911 01/10/20 0454 01/11/20 0333  AST 148* 120* 92* 87*  ALT 105* 98* 84* 84*  ALKPHOS 94 92 83 79  BILITOT 2.0* 3.1* 2.7* 3.6*  PROT 6.0* 5.8* 5.5* 5.4*  ALBUMIN 3.0* 2.9* 2.7* 2.8*   Recent Labs  Lab 01/08/20 1633  LIPASE 55*   Recent Labs  Lab  01/09/20 0911 01/11/20 0333  AMMONIA 100* 33   CBC: Recent Labs  Lab 01/08/20 1633 01/09/20 0609 01/10/20 0454 01/11/20 0333  WBC 5.6 7.3 5.8 4.6  NEUTROABS 4.3  --   --   --   HGB 13.4 12.7 12.4 11.9*  HCT 39.0 38.6 37.3 36.4  MCV 91.3 94.1 95.9 95.8  PLT 40* 43* 45* 40*   Cardiac Enzymes: No results for input(s): CKTOTAL, CKMB, CKMBINDEX, TROPONINI in the last 168 hours. BNP: Invalid input(s): POCBNP CBG: Recent Labs  Lab 01/11/20 1150 01/11/20 1814 01/12/20 0003 01/12/20 0618 01/12/20 1222  GLUCAP 166* 116* 89 92 111*   D-Dimer No results for input(s): DDIMER in the last 72 hours. Hgb A1c No results for input(s): HGBA1C in the last 72 hours. Lipid Profile No results for input(s): CHOL, HDL, LDLCALC, TRIG, CHOLHDL, LDLDIRECT in the last 72 hours. Thyroid function studies No results for input(s): TSH, T4TOTAL, T3FREE, THYROIDAB in the last 72 hours.  Invalid input(s): FREET3 Anemia work up No results for input(s): VITAMINB12, FOLATE, FERRITIN, TIBC, IRON, RETICCTPCT in the last 72 hours. Urinalysis    Component Value Date/Time   COLORURINE YELLOW 01/08/2020 1617   APPEARANCEUR HAZY (A) 01/08/2020 1617   LABSPEC 1.026 01/08/2020 1617   PHURINE 6.0 01/08/2020 1617   GLUCOSEU NEGATIVE 01/08/2020 1617   HGBUR SMALL (A) 01/08/2020 1617   BILIRUBINUR NEGATIVE 01/08/2020 1617   BILIRUBINUR NEGATIVE 03/06/2013 1100   KETONESUR NEGATIVE 01/08/2020 1617   PROTEINUR 30 (A) 01/08/2020 1617   UROBILINOGEN negative 03/06/2013 1100   NITRITE NEGATIVE 01/08/2020 1617   LEUKOCYTESUR NEGATIVE 01/08/2020 1617   Sepsis Labs Invalid input(s): PROCALCITONIN,  WBC,  LACTICIDVEN Microbiology Recent Results (from the past 240 hour(s))  Resp Panel by RT PCR (RSV, Flu A&B, Covid) - Nasopharyngeal Swab     Status: None   Collection Time: 01/08/20  4:18 PM   Specimen: Nasopharyngeal Swab  Result Value Ref Range Status   SARS Coronavirus 2 by RT PCR NEGATIVE NEGATIVE Final     Comment: (NOTE) SARS-CoV-2 target nucleic acids are NOT DETECTED.  The SARS-CoV-2 RNA is generally detectable in upper respiratoy specimens during the acute phase of infection. The lowest  concentration of SARS-CoV-2 viral copies this assay can detect is 131 copies/mL. A negative result does not preclude SARS-Cov-2 infection and should not be used as the sole basis for treatment or other patient management decisions. A negative result may occur with  improper specimen collection/handling, submission of specimen other than nasopharyngeal swab, presence of viral mutation(s) within the areas targeted by this assay, and inadequate number of viral copies (<131 copies/mL). A negative result must be combined with clinical observations, patient history, and epidemiological information. The expected result is Negative.  Fact Sheet for Patients:  PinkCheek.be  Fact Sheet for Healthcare Providers:  GravelBags.it  This test is no t yet approved or cleared by the [place] FDA and  has been authorized for detection and/or diagnosis of SARS-CoV-2 by FDA under an Emergency Use Authorization (EUA). This EUA will remain  in effect (meaning this test can be used) for the duration of the COVID-19 declaration under Section 564(b)(1) of the Act, 21 U.S.C. section 360bbb-3(b)(1), unless the authorization is terminated or revoked sooner.     Influenza A by PCR NEGATIVE NEGATIVE Final   Influenza B by PCR NEGATIVE NEGATIVE Final    Comment: (NOTE) The Xpert Xpress SARS-CoV-2/FLU/RSV assay is intended as an aid in  the diagnosis of influenza from Nasopharyngeal swab specimens and  should not be used as a sole basis for treatment. Nasal washings and  aspirates are unacceptable for Xpert Xpress SARS-CoV-2/FLU/RSV  testing.  Fact Sheet for Patients: PinkCheek.be  Fact Sheet for Healthcare  Providers: GravelBags.it  This test is not yet approved or cleared by the [place] FDA and  has been authorized for detection and/or diagnosis of SARS-CoV-2 by  FDA under an Emergency Use Authorization (EUA). This EUA will remain  in effect (meaning this test can be used) for the duration of the  Covid-19 declaration under Section 564(b)(1) of the Act, 21  U.S.C. section 360bbb-3(b)(1), unless the authorization is  terminated or revoked.    Respiratory Syncytial Virus by PCR NEGATIVE NEGATIVE Final    Comment: (NOTE) Fact Sheet for Patients: PinkCheek.be  Fact Sheet for Healthcare Providers: GravelBags.it  This test is not yet approved or cleared by the [place] FDA and  has been authorized for detection and/or diagnosis of SARS-CoV-2 by  FDA under an Emergency Use Authorization (EUA). This EUA will remain  in effect (meaning this test can be used) for the duration of the  COVID-19 declaration under Section 564(b)(1) of the Act, 21 U.S.C.  section 360bbb-3(b)(1), unless the authorization is terminated or  revoked. Performed at [facility], [address] [place], [place] [address]      Time coordinating discharge: 35 minutes  SIGNED:   Rodena Goldmann, DO [facility] 01/14/2020, 4:40 PM  If 7PM-7AM, please contact night-coverage [email or web address]

## 2020-01-15 ENCOUNTER — Ambulatory Visit (HOSPITAL_COMMUNITY): Payer: Medicare Other | Attending: Orthopedic Surgery | Admitting: Physical Therapy

## 2020-01-15 ENCOUNTER — Encounter (HOSPITAL_COMMUNITY): Payer: Self-pay

## 2020-01-17 DIAGNOSIS — Z23 Encounter for immunization: Secondary | ICD-10-CM | POA: Diagnosis not present

## 2020-01-18 DIAGNOSIS — Z923 Personal history of irradiation: Secondary | ICD-10-CM | POA: Diagnosis not present

## 2020-01-18 DIAGNOSIS — J45909 Unspecified asthma, uncomplicated: Secondary | ICD-10-CM | POA: Diagnosis not present

## 2020-01-18 DIAGNOSIS — Z7951 Long term (current) use of inhaled steroids: Secondary | ICD-10-CM | POA: Diagnosis not present

## 2020-01-18 DIAGNOSIS — K56699 Other intestinal obstruction unspecified as to partial versus complete obstruction: Secondary | ICD-10-CM | POA: Diagnosis not present

## 2020-01-18 DIAGNOSIS — I2510 Atherosclerotic heart disease of native coronary artery without angina pectoris: Secondary | ICD-10-CM | POA: Diagnosis not present

## 2020-01-18 DIAGNOSIS — I700 Atherosclerosis of aorta: Secondary | ICD-10-CM | POA: Diagnosis not present

## 2020-01-18 DIAGNOSIS — Z8542 Personal history of malignant neoplasm of other parts of uterus: Secondary | ICD-10-CM | POA: Diagnosis not present

## 2020-01-18 DIAGNOSIS — K5669 Other partial intestinal obstruction: Secondary | ICD-10-CM | POA: Diagnosis not present

## 2020-01-18 DIAGNOSIS — E785 Hyperlipidemia, unspecified: Secondary | ICD-10-CM | POA: Diagnosis not present

## 2020-01-18 DIAGNOSIS — K766 Portal hypertension: Secondary | ICD-10-CM | POA: Diagnosis not present

## 2020-01-18 DIAGNOSIS — D509 Iron deficiency anemia, unspecified: Secondary | ICD-10-CM | POA: Diagnosis not present

## 2020-01-18 DIAGNOSIS — E119 Type 2 diabetes mellitus without complications: Secondary | ICD-10-CM | POA: Diagnosis not present

## 2020-01-18 DIAGNOSIS — F329 Major depressive disorder, single episode, unspecified: Secondary | ICD-10-CM | POA: Diagnosis not present

## 2020-01-18 DIAGNOSIS — Z89611 Acquired absence of right leg above knee: Secondary | ICD-10-CM | POA: Diagnosis not present

## 2020-01-18 DIAGNOSIS — Z90710 Acquired absence of both cervix and uterus: Secondary | ICD-10-CM | POA: Diagnosis not present

## 2020-01-18 DIAGNOSIS — Z792 Long term (current) use of antibiotics: Secondary | ICD-10-CM | POA: Diagnosis not present

## 2020-01-18 DIAGNOSIS — Z7984 Long term (current) use of oral hypoglycemic drugs: Secondary | ICD-10-CM | POA: Diagnosis not present

## 2020-01-18 DIAGNOSIS — F419 Anxiety disorder, unspecified: Secondary | ICD-10-CM | POA: Diagnosis not present

## 2020-01-18 DIAGNOSIS — I119 Hypertensive heart disease without heart failure: Secondary | ICD-10-CM | POA: Diagnosis not present

## 2020-01-18 DIAGNOSIS — Z794 Long term (current) use of insulin: Secondary | ICD-10-CM | POA: Diagnosis not present

## 2020-01-18 DIAGNOSIS — K7581 Nonalcoholic steatohepatitis (NASH): Secondary | ICD-10-CM | POA: Diagnosis not present

## 2020-01-20 DIAGNOSIS — E1165 Type 2 diabetes mellitus with hyperglycemia: Secondary | ICD-10-CM | POA: Diagnosis not present

## 2020-01-20 DIAGNOSIS — I10 Essential (primary) hypertension: Secondary | ICD-10-CM | POA: Diagnosis not present

## 2020-01-20 DIAGNOSIS — E7849 Other hyperlipidemia: Secondary | ICD-10-CM | POA: Diagnosis not present

## 2020-01-20 DIAGNOSIS — K7581 Nonalcoholic steatohepatitis (NASH): Secondary | ICD-10-CM | POA: Diagnosis not present

## 2020-01-23 DIAGNOSIS — K766 Portal hypertension: Secondary | ICD-10-CM | POA: Diagnosis not present

## 2020-01-23 DIAGNOSIS — K7581 Nonalcoholic steatohepatitis (NASH): Secondary | ICD-10-CM | POA: Diagnosis not present

## 2020-01-23 DIAGNOSIS — K5669 Other partial intestinal obstruction: Secondary | ICD-10-CM | POA: Diagnosis not present

## 2020-01-23 DIAGNOSIS — E119 Type 2 diabetes mellitus without complications: Secondary | ICD-10-CM | POA: Diagnosis not present

## 2020-01-23 DIAGNOSIS — D509 Iron deficiency anemia, unspecified: Secondary | ICD-10-CM | POA: Diagnosis not present

## 2020-01-23 DIAGNOSIS — I2510 Atherosclerotic heart disease of native coronary artery without angina pectoris: Secondary | ICD-10-CM | POA: Diagnosis not present

## 2020-01-25 DIAGNOSIS — E119 Type 2 diabetes mellitus without complications: Secondary | ICD-10-CM | POA: Diagnosis not present

## 2020-01-25 DIAGNOSIS — K5669 Other partial intestinal obstruction: Secondary | ICD-10-CM | POA: Diagnosis not present

## 2020-01-25 DIAGNOSIS — K7581 Nonalcoholic steatohepatitis (NASH): Secondary | ICD-10-CM | POA: Diagnosis not present

## 2020-01-25 DIAGNOSIS — K766 Portal hypertension: Secondary | ICD-10-CM | POA: Diagnosis not present

## 2020-01-25 DIAGNOSIS — D509 Iron deficiency anemia, unspecified: Secondary | ICD-10-CM | POA: Diagnosis not present

## 2020-01-25 DIAGNOSIS — I2510 Atherosclerotic heart disease of native coronary artery without angina pectoris: Secondary | ICD-10-CM | POA: Diagnosis not present

## 2020-01-29 DIAGNOSIS — D509 Iron deficiency anemia, unspecified: Secondary | ICD-10-CM | POA: Diagnosis not present

## 2020-01-29 DIAGNOSIS — K766 Portal hypertension: Secondary | ICD-10-CM | POA: Diagnosis not present

## 2020-01-29 DIAGNOSIS — K5669 Other partial intestinal obstruction: Secondary | ICD-10-CM | POA: Diagnosis not present

## 2020-01-29 DIAGNOSIS — E119 Type 2 diabetes mellitus without complications: Secondary | ICD-10-CM | POA: Diagnosis not present

## 2020-01-29 DIAGNOSIS — I2510 Atherosclerotic heart disease of native coronary artery without angina pectoris: Secondary | ICD-10-CM | POA: Diagnosis not present

## 2020-01-29 DIAGNOSIS — K7581 Nonalcoholic steatohepatitis (NASH): Secondary | ICD-10-CM | POA: Diagnosis not present

## 2020-01-31 DIAGNOSIS — I2510 Atherosclerotic heart disease of native coronary artery without angina pectoris: Secondary | ICD-10-CM | POA: Diagnosis not present

## 2020-01-31 DIAGNOSIS — K766 Portal hypertension: Secondary | ICD-10-CM | POA: Diagnosis not present

## 2020-01-31 DIAGNOSIS — D509 Iron deficiency anemia, unspecified: Secondary | ICD-10-CM | POA: Diagnosis not present

## 2020-01-31 DIAGNOSIS — K7581 Nonalcoholic steatohepatitis (NASH): Secondary | ICD-10-CM | POA: Diagnosis not present

## 2020-01-31 DIAGNOSIS — K5669 Other partial intestinal obstruction: Secondary | ICD-10-CM | POA: Diagnosis not present

## 2020-01-31 DIAGNOSIS — E119 Type 2 diabetes mellitus without complications: Secondary | ICD-10-CM | POA: Diagnosis not present

## 2020-02-01 DIAGNOSIS — M25562 Pain in left knee: Secondary | ICD-10-CM | POA: Diagnosis not present

## 2020-02-06 DIAGNOSIS — K5669 Other partial intestinal obstruction: Secondary | ICD-10-CM | POA: Diagnosis not present

## 2020-02-06 DIAGNOSIS — E119 Type 2 diabetes mellitus without complications: Secondary | ICD-10-CM | POA: Diagnosis not present

## 2020-02-06 DIAGNOSIS — K766 Portal hypertension: Secondary | ICD-10-CM | POA: Diagnosis not present

## 2020-02-06 DIAGNOSIS — K7581 Nonalcoholic steatohepatitis (NASH): Secondary | ICD-10-CM | POA: Diagnosis not present

## 2020-02-06 DIAGNOSIS — I2510 Atherosclerotic heart disease of native coronary artery without angina pectoris: Secondary | ICD-10-CM | POA: Diagnosis not present

## 2020-02-06 DIAGNOSIS — D509 Iron deficiency anemia, unspecified: Secondary | ICD-10-CM | POA: Diagnosis not present

## 2020-02-08 DIAGNOSIS — D509 Iron deficiency anemia, unspecified: Secondary | ICD-10-CM | POA: Diagnosis not present

## 2020-02-08 DIAGNOSIS — K5669 Other partial intestinal obstruction: Secondary | ICD-10-CM | POA: Diagnosis not present

## 2020-02-08 DIAGNOSIS — K7581 Nonalcoholic steatohepatitis (NASH): Secondary | ICD-10-CM | POA: Diagnosis not present

## 2020-02-08 DIAGNOSIS — E119 Type 2 diabetes mellitus without complications: Secondary | ICD-10-CM | POA: Diagnosis not present

## 2020-02-08 DIAGNOSIS — I2510 Atherosclerotic heart disease of native coronary artery without angina pectoris: Secondary | ICD-10-CM | POA: Diagnosis not present

## 2020-02-08 DIAGNOSIS — K766 Portal hypertension: Secondary | ICD-10-CM | POA: Diagnosis not present

## 2020-02-09 DIAGNOSIS — Z8669 Personal history of other diseases of the nervous system and sense organs: Secondary | ICD-10-CM | POA: Diagnosis not present

## 2020-02-09 DIAGNOSIS — Z8719 Personal history of other diseases of the digestive system: Secondary | ICD-10-CM | POA: Diagnosis not present

## 2020-02-09 DIAGNOSIS — Z87898 Personal history of other specified conditions: Secondary | ICD-10-CM | POA: Diagnosis not present

## 2020-02-09 DIAGNOSIS — K7469 Other cirrhosis of liver: Secondary | ICD-10-CM | POA: Diagnosis not present

## 2020-02-12 DIAGNOSIS — K7469 Other cirrhosis of liver: Secondary | ICD-10-CM | POA: Diagnosis not present

## 2020-02-13 DIAGNOSIS — I2510 Atherosclerotic heart disease of native coronary artery without angina pectoris: Secondary | ICD-10-CM | POA: Diagnosis not present

## 2020-02-13 DIAGNOSIS — E119 Type 2 diabetes mellitus without complications: Secondary | ICD-10-CM | POA: Diagnosis not present

## 2020-02-13 DIAGNOSIS — K7581 Nonalcoholic steatohepatitis (NASH): Secondary | ICD-10-CM | POA: Diagnosis not present

## 2020-02-13 DIAGNOSIS — D509 Iron deficiency anemia, unspecified: Secondary | ICD-10-CM | POA: Diagnosis not present

## 2020-02-13 DIAGNOSIS — K5669 Other partial intestinal obstruction: Secondary | ICD-10-CM | POA: Diagnosis not present

## 2020-02-13 DIAGNOSIS — K766 Portal hypertension: Secondary | ICD-10-CM | POA: Diagnosis not present

## 2020-02-14 DIAGNOSIS — K5669 Other partial intestinal obstruction: Secondary | ICD-10-CM | POA: Diagnosis not present

## 2020-02-14 DIAGNOSIS — I2510 Atherosclerotic heart disease of native coronary artery without angina pectoris: Secondary | ICD-10-CM | POA: Diagnosis not present

## 2020-02-14 DIAGNOSIS — K766 Portal hypertension: Secondary | ICD-10-CM | POA: Diagnosis not present

## 2020-02-14 DIAGNOSIS — D509 Iron deficiency anemia, unspecified: Secondary | ICD-10-CM | POA: Diagnosis not present

## 2020-02-14 DIAGNOSIS — K7581 Nonalcoholic steatohepatitis (NASH): Secondary | ICD-10-CM | POA: Diagnosis not present

## 2020-02-14 DIAGNOSIS — E119 Type 2 diabetes mellitus without complications: Secondary | ICD-10-CM | POA: Diagnosis not present

## 2020-02-15 DIAGNOSIS — S78119A Complete traumatic amputation at level between unspecified hip and knee, initial encounter: Secondary | ICD-10-CM | POA: Diagnosis not present

## 2020-02-15 DIAGNOSIS — E1165 Type 2 diabetes mellitus with hyperglycemia: Secondary | ICD-10-CM | POA: Diagnosis not present

## 2020-02-15 DIAGNOSIS — K7581 Nonalcoholic steatohepatitis (NASH): Secondary | ICD-10-CM | POA: Diagnosis not present

## 2020-02-15 DIAGNOSIS — K56609 Unspecified intestinal obstruction, unspecified as to partial versus complete obstruction: Secondary | ICD-10-CM | POA: Diagnosis not present

## 2020-02-15 DIAGNOSIS — K7290 Hepatic failure, unspecified without coma: Secondary | ICD-10-CM | POA: Diagnosis not present

## 2020-02-15 DIAGNOSIS — F419 Anxiety disorder, unspecified: Secondary | ICD-10-CM | POA: Diagnosis not present

## 2020-02-15 DIAGNOSIS — I10 Essential (primary) hypertension: Secondary | ICD-10-CM | POA: Diagnosis not present

## 2020-02-17 DIAGNOSIS — D509 Iron deficiency anemia, unspecified: Secondary | ICD-10-CM | POA: Diagnosis not present

## 2020-02-17 DIAGNOSIS — I2510 Atherosclerotic heart disease of native coronary artery without angina pectoris: Secondary | ICD-10-CM | POA: Diagnosis not present

## 2020-02-17 DIAGNOSIS — Z7984 Long term (current) use of oral hypoglycemic drugs: Secondary | ICD-10-CM | POA: Diagnosis not present

## 2020-02-17 DIAGNOSIS — K5669 Other partial intestinal obstruction: Secondary | ICD-10-CM | POA: Diagnosis not present

## 2020-02-17 DIAGNOSIS — Z8542 Personal history of malignant neoplasm of other parts of uterus: Secondary | ICD-10-CM | POA: Diagnosis not present

## 2020-02-17 DIAGNOSIS — I119 Hypertensive heart disease without heart failure: Secondary | ICD-10-CM | POA: Diagnosis not present

## 2020-02-17 DIAGNOSIS — K766 Portal hypertension: Secondary | ICD-10-CM | POA: Diagnosis not present

## 2020-02-17 DIAGNOSIS — Z923 Personal history of irradiation: Secondary | ICD-10-CM | POA: Diagnosis not present

## 2020-02-17 DIAGNOSIS — F419 Anxiety disorder, unspecified: Secondary | ICD-10-CM | POA: Diagnosis not present

## 2020-02-17 DIAGNOSIS — I700 Atherosclerosis of aorta: Secondary | ICD-10-CM | POA: Diagnosis not present

## 2020-02-17 DIAGNOSIS — Z7951 Long term (current) use of inhaled steroids: Secondary | ICD-10-CM | POA: Diagnosis not present

## 2020-02-17 DIAGNOSIS — Z89611 Acquired absence of right leg above knee: Secondary | ICD-10-CM | POA: Diagnosis not present

## 2020-02-17 DIAGNOSIS — J45909 Unspecified asthma, uncomplicated: Secondary | ICD-10-CM | POA: Diagnosis not present

## 2020-02-17 DIAGNOSIS — Z794 Long term (current) use of insulin: Secondary | ICD-10-CM | POA: Diagnosis not present

## 2020-02-17 DIAGNOSIS — Z90710 Acquired absence of both cervix and uterus: Secondary | ICD-10-CM | POA: Diagnosis not present

## 2020-02-17 DIAGNOSIS — E785 Hyperlipidemia, unspecified: Secondary | ICD-10-CM | POA: Diagnosis not present

## 2020-02-17 DIAGNOSIS — K7581 Nonalcoholic steatohepatitis (NASH): Secondary | ICD-10-CM | POA: Diagnosis not present

## 2020-02-17 DIAGNOSIS — F329 Major depressive disorder, single episode, unspecified: Secondary | ICD-10-CM | POA: Diagnosis not present

## 2020-02-17 DIAGNOSIS — Z792 Long term (current) use of antibiotics: Secondary | ICD-10-CM | POA: Diagnosis not present

## 2020-02-17 DIAGNOSIS — E119 Type 2 diabetes mellitus without complications: Secondary | ICD-10-CM | POA: Diagnosis not present

## 2020-02-20 DIAGNOSIS — I2510 Atherosclerotic heart disease of native coronary artery without angina pectoris: Secondary | ICD-10-CM | POA: Diagnosis not present

## 2020-02-20 DIAGNOSIS — I10 Essential (primary) hypertension: Secondary | ICD-10-CM | POA: Diagnosis not present

## 2020-02-20 DIAGNOSIS — E1165 Type 2 diabetes mellitus with hyperglycemia: Secondary | ICD-10-CM | POA: Diagnosis not present

## 2020-02-20 DIAGNOSIS — K7581 Nonalcoholic steatohepatitis (NASH): Secondary | ICD-10-CM | POA: Diagnosis not present

## 2020-02-20 DIAGNOSIS — D509 Iron deficiency anemia, unspecified: Secondary | ICD-10-CM | POA: Diagnosis not present

## 2020-02-20 DIAGNOSIS — K5669 Other partial intestinal obstruction: Secondary | ICD-10-CM | POA: Diagnosis not present

## 2020-02-20 DIAGNOSIS — E7849 Other hyperlipidemia: Secondary | ICD-10-CM | POA: Diagnosis not present

## 2020-02-20 DIAGNOSIS — E119 Type 2 diabetes mellitus without complications: Secondary | ICD-10-CM | POA: Diagnosis not present

## 2020-02-20 DIAGNOSIS — K766 Portal hypertension: Secondary | ICD-10-CM | POA: Diagnosis not present

## 2020-02-21 DIAGNOSIS — K5669 Other partial intestinal obstruction: Secondary | ICD-10-CM | POA: Diagnosis not present

## 2020-02-21 DIAGNOSIS — E119 Type 2 diabetes mellitus without complications: Secondary | ICD-10-CM | POA: Diagnosis not present

## 2020-02-21 DIAGNOSIS — K766 Portal hypertension: Secondary | ICD-10-CM | POA: Diagnosis not present

## 2020-02-21 DIAGNOSIS — D509 Iron deficiency anemia, unspecified: Secondary | ICD-10-CM | POA: Diagnosis not present

## 2020-02-21 DIAGNOSIS — I2510 Atherosclerotic heart disease of native coronary artery without angina pectoris: Secondary | ICD-10-CM | POA: Diagnosis not present

## 2020-02-21 DIAGNOSIS — K7581 Nonalcoholic steatohepatitis (NASH): Secondary | ICD-10-CM | POA: Diagnosis not present

## 2020-02-22 DIAGNOSIS — I517 Cardiomegaly: Secondary | ICD-10-CM | POA: Diagnosis not present

## 2020-02-22 DIAGNOSIS — R531 Weakness: Secondary | ICD-10-CM | POA: Diagnosis not present

## 2020-02-22 DIAGNOSIS — I959 Hypotension, unspecified: Secondary | ICD-10-CM | POA: Diagnosis not present

## 2020-02-22 DIAGNOSIS — K7460 Unspecified cirrhosis of liver: Secondary | ICD-10-CM | POA: Diagnosis not present

## 2020-02-22 DIAGNOSIS — K7290 Hepatic failure, unspecified without coma: Secondary | ICD-10-CM | POA: Diagnosis not present

## 2020-02-22 DIAGNOSIS — Z20822 Contact with and (suspected) exposure to covid-19: Secondary | ICD-10-CM | POA: Diagnosis not present

## 2020-02-22 DIAGNOSIS — R5381 Other malaise: Secondary | ICD-10-CM | POA: Diagnosis not present

## 2020-02-22 DIAGNOSIS — K7291 Hepatic failure, unspecified with coma: Secondary | ICD-10-CM | POA: Diagnosis not present

## 2020-02-22 DIAGNOSIS — N390 Urinary tract infection, site not specified: Secondary | ICD-10-CM | POA: Diagnosis not present

## 2020-02-22 DIAGNOSIS — E875 Hyperkalemia: Secondary | ICD-10-CM | POA: Diagnosis not present

## 2020-02-22 DIAGNOSIS — N189 Chronic kidney disease, unspecified: Secondary | ICD-10-CM | POA: Diagnosis not present

## 2020-02-22 DIAGNOSIS — E785 Hyperlipidemia, unspecified: Secondary | ICD-10-CM | POA: Diagnosis not present

## 2020-02-22 DIAGNOSIS — I10 Essential (primary) hypertension: Secondary | ICD-10-CM | POA: Diagnosis not present

## 2020-02-22 DIAGNOSIS — S0083XA Contusion of other part of head, initial encounter: Secondary | ICD-10-CM | POA: Diagnosis not present

## 2020-02-22 DIAGNOSIS — G9341 Metabolic encephalopathy: Secondary | ICD-10-CM | POA: Diagnosis not present

## 2020-02-22 DIAGNOSIS — R0902 Hypoxemia: Secondary | ICD-10-CM | POA: Diagnosis not present

## 2020-02-22 DIAGNOSIS — J0130 Acute sphenoidal sinusitis, unspecified: Secondary | ICD-10-CM | POA: Diagnosis not present

## 2020-02-22 DIAGNOSIS — J320 Chronic maxillary sinusitis: Secondary | ICD-10-CM | POA: Diagnosis not present

## 2020-02-22 DIAGNOSIS — R4020 Unspecified coma: Secondary | ICD-10-CM | POA: Diagnosis not present

## 2020-02-22 DIAGNOSIS — Z794 Long term (current) use of insulin: Secondary | ICD-10-CM | POA: Diagnosis not present

## 2020-02-22 DIAGNOSIS — K5900 Constipation, unspecified: Secondary | ICD-10-CM | POA: Diagnosis not present

## 2020-02-22 DIAGNOSIS — Z7984 Long term (current) use of oral hypoglycemic drugs: Secondary | ICD-10-CM | POA: Diagnosis not present

## 2020-02-22 DIAGNOSIS — R4182 Altered mental status, unspecified: Secondary | ICD-10-CM | POA: Diagnosis not present

## 2020-02-22 DIAGNOSIS — E871 Hypo-osmolality and hyponatremia: Secondary | ICD-10-CM | POA: Diagnosis not present

## 2020-02-22 DIAGNOSIS — E119 Type 2 diabetes mellitus without complications: Secondary | ICD-10-CM | POA: Diagnosis not present

## 2020-02-22 DIAGNOSIS — J0100 Acute maxillary sinusitis, unspecified: Secondary | ICD-10-CM | POA: Diagnosis not present

## 2020-02-22 DIAGNOSIS — Z89611 Acquired absence of right leg above knee: Secondary | ICD-10-CM | POA: Diagnosis not present

## 2020-02-22 DIAGNOSIS — K7581 Nonalcoholic steatohepatitis (NASH): Secondary | ICD-10-CM | POA: Diagnosis not present

## 2020-02-27 DIAGNOSIS — D509 Iron deficiency anemia, unspecified: Secondary | ICD-10-CM | POA: Diagnosis not present

## 2020-02-27 DIAGNOSIS — K5669 Other partial intestinal obstruction: Secondary | ICD-10-CM | POA: Diagnosis not present

## 2020-02-27 DIAGNOSIS — K7581 Nonalcoholic steatohepatitis (NASH): Secondary | ICD-10-CM | POA: Diagnosis not present

## 2020-02-27 DIAGNOSIS — E119 Type 2 diabetes mellitus without complications: Secondary | ICD-10-CM | POA: Diagnosis not present

## 2020-02-27 DIAGNOSIS — I2510 Atherosclerotic heart disease of native coronary artery without angina pectoris: Secondary | ICD-10-CM | POA: Diagnosis not present

## 2020-02-27 DIAGNOSIS — K766 Portal hypertension: Secondary | ICD-10-CM | POA: Diagnosis not present

## 2020-03-05 DIAGNOSIS — D509 Iron deficiency anemia, unspecified: Secondary | ICD-10-CM | POA: Diagnosis not present

## 2020-03-05 DIAGNOSIS — K7581 Nonalcoholic steatohepatitis (NASH): Secondary | ICD-10-CM | POA: Diagnosis not present

## 2020-03-05 DIAGNOSIS — K5669 Other partial intestinal obstruction: Secondary | ICD-10-CM | POA: Diagnosis not present

## 2020-03-05 DIAGNOSIS — I2510 Atherosclerotic heart disease of native coronary artery without angina pectoris: Secondary | ICD-10-CM | POA: Diagnosis not present

## 2020-03-05 DIAGNOSIS — K766 Portal hypertension: Secondary | ICD-10-CM | POA: Diagnosis not present

## 2020-03-05 DIAGNOSIS — E119 Type 2 diabetes mellitus without complications: Secondary | ICD-10-CM | POA: Diagnosis not present

## 2020-03-07 DIAGNOSIS — K766 Portal hypertension: Secondary | ICD-10-CM | POA: Diagnosis not present

## 2020-03-07 DIAGNOSIS — K7581 Nonalcoholic steatohepatitis (NASH): Secondary | ICD-10-CM | POA: Diagnosis not present

## 2020-03-07 DIAGNOSIS — K5669 Other partial intestinal obstruction: Secondary | ICD-10-CM | POA: Diagnosis not present

## 2020-03-07 DIAGNOSIS — E119 Type 2 diabetes mellitus without complications: Secondary | ICD-10-CM | POA: Diagnosis not present

## 2020-03-07 DIAGNOSIS — I2510 Atherosclerotic heart disease of native coronary artery without angina pectoris: Secondary | ICD-10-CM | POA: Diagnosis not present

## 2020-03-07 DIAGNOSIS — D509 Iron deficiency anemia, unspecified: Secondary | ICD-10-CM | POA: Diagnosis not present

## 2020-03-12 DIAGNOSIS — I2510 Atherosclerotic heart disease of native coronary artery without angina pectoris: Secondary | ICD-10-CM | POA: Diagnosis not present

## 2020-03-12 DIAGNOSIS — D509 Iron deficiency anemia, unspecified: Secondary | ICD-10-CM | POA: Diagnosis not present

## 2020-03-12 DIAGNOSIS — E119 Type 2 diabetes mellitus without complications: Secondary | ICD-10-CM | POA: Diagnosis not present

## 2020-03-12 DIAGNOSIS — K7581 Nonalcoholic steatohepatitis (NASH): Secondary | ICD-10-CM | POA: Diagnosis not present

## 2020-03-12 DIAGNOSIS — K5669 Other partial intestinal obstruction: Secondary | ICD-10-CM | POA: Diagnosis not present

## 2020-03-12 DIAGNOSIS — K766 Portal hypertension: Secondary | ICD-10-CM | POA: Diagnosis not present

## 2020-03-14 DIAGNOSIS — I10 Essential (primary) hypertension: Secondary | ICD-10-CM | POA: Diagnosis not present

## 2020-03-14 DIAGNOSIS — K7290 Hepatic failure, unspecified without coma: Secondary | ICD-10-CM | POA: Diagnosis not present

## 2020-03-14 DIAGNOSIS — F419 Anxiety disorder, unspecified: Secondary | ICD-10-CM | POA: Diagnosis not present

## 2020-03-14 DIAGNOSIS — K7581 Nonalcoholic steatohepatitis (NASH): Secondary | ICD-10-CM | POA: Diagnosis not present

## 2020-03-14 DIAGNOSIS — S78119A Complete traumatic amputation at level between unspecified hip and knee, initial encounter: Secondary | ICD-10-CM | POA: Diagnosis not present

## 2020-03-14 DIAGNOSIS — E1165 Type 2 diabetes mellitus with hyperglycemia: Secondary | ICD-10-CM | POA: Diagnosis not present

## 2020-03-22 DIAGNOSIS — E1165 Type 2 diabetes mellitus with hyperglycemia: Secondary | ICD-10-CM | POA: Diagnosis not present

## 2020-03-22 DIAGNOSIS — E7849 Other hyperlipidemia: Secondary | ICD-10-CM | POA: Diagnosis not present

## 2020-03-22 DIAGNOSIS — I10 Essential (primary) hypertension: Secondary | ICD-10-CM | POA: Diagnosis not present

## 2020-03-26 ENCOUNTER — Ambulatory Visit (HOSPITAL_COMMUNITY): Payer: Medicare Other | Admitting: Physical Therapy

## 2020-03-27 DIAGNOSIS — R300 Dysuria: Secondary | ICD-10-CM | POA: Diagnosis not present

## 2020-04-20 DIAGNOSIS — E1165 Type 2 diabetes mellitus with hyperglycemia: Secondary | ICD-10-CM | POA: Diagnosis not present

## 2020-04-20 DIAGNOSIS — I10 Essential (primary) hypertension: Secondary | ICD-10-CM | POA: Diagnosis not present

## 2020-04-20 DIAGNOSIS — K5781 Diverticulitis of intestine, part unspecified, with perforation and abscess with bleeding: Secondary | ICD-10-CM | POA: Diagnosis not present

## 2020-04-20 DIAGNOSIS — E7849 Other hyperlipidemia: Secondary | ICD-10-CM | POA: Diagnosis not present

## 2020-04-22 ENCOUNTER — Ambulatory Visit (HOSPITAL_COMMUNITY): Payer: Medicare Other | Attending: Orthopedic Surgery | Admitting: Physical Therapy

## 2020-04-22 ENCOUNTER — Encounter (HOSPITAL_COMMUNITY): Payer: Self-pay | Admitting: Physical Therapy

## 2020-04-22 ENCOUNTER — Other Ambulatory Visit: Payer: Self-pay

## 2020-04-22 DIAGNOSIS — M6281 Muscle weakness (generalized): Secondary | ICD-10-CM | POA: Insufficient documentation

## 2020-04-22 DIAGNOSIS — R2689 Other abnormalities of gait and mobility: Secondary | ICD-10-CM | POA: Diagnosis not present

## 2020-04-22 DIAGNOSIS — R29898 Other symptoms and signs involving the musculoskeletal system: Secondary | ICD-10-CM | POA: Insufficient documentation

## 2020-04-22 NOTE — Therapy (Signed)
[place], [place], [address] Phone: [phone]   Fax:  [phone]  Physical Therapy Evaluation  Patient Details  Name: Brittany Russo MRN: [id number] Date of Birth: March 31, 1945 Referring Provider (PT): Paralee Cancel MD   Encounter Date: 04/22/2020   PT End of Session - 04/22/20 1513    Visit Number 1    Number of Visits 16    Date for PT Re-Evaluation 06/17/20    Authorization Type Primary Medicare Secondary Medicaid    Progress Note Due on Visit 10    PT Start Time 1415   arrives late   PT Stop Time 1503    PT Time Calculation (min) 48 min    Activity Tolerance Patient tolerated treatment well    Behavior During Therapy [facility] for tasks assessed/performed           Past Medical History:  Diagnosis Date  . Acute and subacute hepatic failure without coma   . Allergy   . Anasarca   . Anxiety   . Arthritis   . Ascites 09/2018   no SBP on 850 cc tap 10/13/18  . Asthma    allery induced  . Cirrhosis of liver ([place]) ~ 2004   from NAFLD.  Dx ~ 2004.  Dr Dorcas Mcmurray at [facility]/    . Depression   . Diabetes mellitus without complication ([place])    type 2  . Eczema of both hands   . Endometrial cancer ([place]) 1996, 2003   hysterectomy 1996.  recurrence 2003, treated with radiation.    . Generalized edema   . GI hemorrhage   . 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 in [place]  . Hyperlipidemia   . Hypertension   . Muscle weakness   . Seizures ([place])    last seizure June 07, 2014   . Thrombocytopenia ([place])   . Thyroid nodule     Past Surgical History:  Procedure Laterality Date  . AMPUTATION Right 06/29/2019   Procedure: AMPUTATION ABOVE KNEE through knee;  Surgeon: Paralee Cancel, MD;  Location: [facility];  Service: Orthopedics;  Laterality: Right;  2 hrs  . CATARACT EXTRACTION W/PHACO Left 06/04/2017   Procedure: CATARACT EXTRACTION PHACO AND  INTRAOCULAR LENS PLACEMENT (IOC);  Surgeon: Baruch Goldmann, MD;  Location: [facility];  Service: Ophthalmology;  Laterality: Left;  CDE: 3.90  . CATARACT EXTRACTION W/PHACO Right 07/15/2017   Procedure: CATARACT EXTRACTION PHACO AND INTRAOCULAR LENS PLACEMENT (IOC);  Surgeon: Baruch Goldmann, MD;  Location: [facility];  Service: Ophthalmology;  Laterality: Right;  CDE: 7.60  . COLONOSCOPY  06/2007   in [place] for hematochzia.  radiation proctitis  . DILATION AND CURETTAGE OF UTERUS    . ESOPHAGOGASTRODUODENOSCOPY  2016  . ESOPHAGOGASTRODUODENOSCOPY (EGD) WITH PROPOFOL N/A 12/30/2018   Procedure: EGD;  Surgeon: Clarene Essex, MD;  [facility]: [facility];  Service: Endoscopy;  Laterality: N/A;  . EXCISIONAL TOTAL KNEE ARTHROPLASTY Right 01/03/2019   Procedure: EXCISIONAL TOTAL KNEE ARTHROPLASTY;  Surgeon: Paralee Cancel, MD;  Location: [facility];  Service: Orthopedics;  Laterality: Right;  . FLEXIBLE SIGMOIDOSCOPY N/A 12/30/2018   Procedure: FLEXIBLE SIGMOIDOSCOPY;  Surgeon: Clarene Essex, MD;  [facility];  Service: Endoscopy;  Laterality: N/A;  . IR PARACENTESIS  10/13/2018  . IR PARACENTESIS  07/21/2019  . IR THORACENTESIS ASP PLEURAL SPACE W/IMG GUIDE  07/19/2019  . STERIOD INJECTION Left 06/29/2019   Procedure: LEFT KNEE INJECTION;  Surgeon: Alvan Dame,  Rodman Key, MD;  Location: [facility];  Service: Orthopedics;  Laterality: Left;  . TOTAL ABDOMINAL HYSTERECTOMY  1996  . TOTAL KNEE ARTHROPLASTY Right 09/22/2018   Procedure: TOTAL KNEE ARTHROPLASTY;  Surgeon: Paralee Cancel, MD;  [facility]: [facility];  Service: Orthopedics;  Laterality: Right;  70 mins  . TOTAL KNEE REVISION Right 09/27/2018   Procedure: TOTAL KNEE REVISION;  Surgeon: Paralee Cancel, MD;  Location: [facility];  Service: Orthopedics;  Laterality: Right;  . TOTAL KNEE REVISION Right 11/22/2018   Procedure: repair right knee extensor mechanism;  Surgeon: Paralee Cancel, MD;  Location: [facility];  Service: Orthopedics;  Laterality: Right;  90 mins    There were no  vitals filed for this visit.    Subjective Assessment - 04/22/20 1415    Subjective Patient is a 75 y.o. female who presents to physical therapy for prosthetic eval and gait training. Patient states she had her knee replaced and she had some issues following that. She had several falls that injured her knee. She has been in and out of hospital since all this happened. She has been using slide board to transfer to her chair. She has not really been doing a whole lot and has been using a bed pain. She has a wound on her tailbone. She did have HHPT. Her L knee is very weak and painful. She has had cortisone shots in her left knee without any help. She has neuropathy and foot drop in LLE. She hopes to be more functioning. She was able to stand on LLE pretty good. Patient states her main goal is to walk. She was not getting around well before all this and surgery was in 2020 for TKA and June 29, 2019 R AKA.    Limitations Walking;Standing;House hold activities    Patient Stated Goals to walk    Currently in Pain? No/denies              [facility] PT Assessment - 04/22/20 0001      Assessment   Medical Diagnosis Prosthetic eval and gait training    Referring Provider (PT) Paralee Cancel MD    Onset Date/Surgical Date 06/29/19    Next MD Visit none schedule    Prior Therapy Inpatient and HHPT      Precautions   Precautions Fall      Balance Screen   Has the patient fallen in the past 6 months Yes    How many times? 1    Has the patient had a decrease in activity level because of a fear of falling?  Yes    Is the patient reluctant to leave their home because of a fear of falling?  Yes      Prior Function   Level of Independence Independent with basic ADLs    Vocation Retired      Associate Professor   Overall Cognitive Status Within Functional Limits for tasks assessed      Observation/Other Assessments   Observations Seated in wheelchair, massaging RLE throughout, gait belted into chair, R AKA;  sacral wound difficult to visualize in seated in intergluteal cleft    Focus on Therapeutic Outcomes (FOTO)  n/a      ROM / Strength   AROM / PROM / Strength Strength      Strength   Strength Assessment Site Hip;Knee    Right/Left Hip Right;Left    Right Hip Flexion 3+/5    Left Hip Flexion 3+/5    Right/Left Knee Left    Left Knee  Extension 3-/5      Palpation   Palpation comment No tenderness throughout RLE, well healed surgical incisions      Wheelchair Mobility   Wheelchair Mobility --   requires mod/max assist to scoot up in chair                     Objective measurements completed on examination: See above findings.               PT Education - 04/22/20 1414    Education Details Patient educated on exam findings, POC, scope of PT, off weighting wound, frequent positional changes, prone lying, possible need for shrinkers    Person(s) Educated Patient;Child(ren)    Methods Explanation;Demonstration;Handout    Comprehension Verbalized understanding;Returned demonstration            PT Short Term Goals - 04/22/20 1559      PT SHORT TERM GOAL #1   Title Patient will be independent with HEP in order to improve functional outcomes.    Time 4    Period Weeks    Status New    Target Date 05/20/20             PT Long Term Goals - 04/22/20 1600      PT LONG TERM GOAL #1   Title Patient will be able to transfer using slide board with minimal assist for improved ability to transfer to/from toilet at home.    Time 8    Period Weeks    Status New    Target Date 06/17/20      PT LONG TERM GOAL #2   Title Patient will be able to transfer to and from standing with RW and prosthetic use for improved ability to off weight buttock.    Time 8    Period Weeks    Status New    Target Date 06/17/20      PT LONG TERM GOAL #3   Title Patient will be able to stand with LRAD for at least 2 minutes to reduce the risk of pressure injury.    Time 8     Period Weeks    Status New    Target Date 06/17/20                  Plan - 04/22/20 1538    Clinical Impression Statement Patient is a 75 y.o. female who presents to physical therapy for prosthetic eval and gait training. Session limited as patient arrives late. Patient's daughter present for session and provides info throughout session. She presents with deficits in strength, ROM, gait, transfers, standing, skin integrity, R AKA, and functional mobility with ADL. She is having to modify and restrict ADL as indicated by subjective information and objective measures which is affecting overall participation. Patient showing impaired bilateral strength. Patient's daughter also states sacral wound toward end of session which is unable to be fully visualized in sitting intergluteal cleft despite trying to reposition patient in seated to visualize wound. Daughter shows picture of recent wound. Discussed POC with patient and daughter about possible referral to HHPT as patient will be unable to attend therapy at the prescribed frequency. Educated patient and daughter on the importance of off weighting sacrum and frequent positional changes to reduce the risk of pressure wounds. Spoke with MD office who will attempt to get patient HHPT. Patient would benefit from more consistant care if patient able to HHPT, patient will be discharged from outpatient PT if  able to particpate with HHPT.  Educated patient and daughter on importance of using shrinker socks on limb. Patient will benefit from skilled physical therapy in order to improve function and reduce impairment.    Personal Factors and Comorbidities Age;Behavior Pattern;Social Background;Fitness;Past/Current Experience;Comorbidity 3+;Transportation;Time since onset of injury/illness/exacerbation    Comorbidities DM, amputee, impaired skin integrity, w/c bound, L knee pain    Examination-Activity Limitations Bathing;Bend;Locomotion  Level;Lift;Transfers;Toileting;Stand;Stairs;Squat    Examination-Participation Restrictions Cleaning;Community Activity;Shop;Volunteer;Yard Work;Other;Meal Prep;Laundry;Driving    Stability/Clinical Decision Making Evolving/Moderate complexity    Clinical Decision Making Moderate    Rehab Potential Fair    PT Frequency 2x / week    PT Duration 8 weeks    PT Treatment/Interventions ADLs/Self Care Home Management;Aquatic Therapy;Cryotherapy;Electrical Stimulation;Moist Heat;Traction;Iontophoresis 4mg /ml Dexamethasone;Ultrasound;DME Instruction;Gait training;Stair training;Functional mobility training;Therapeutic activities;Therapeutic exercise;Balance training;Neuromuscular re-education;Patient/family education;Orthotic Fit/Training;Prosthetic Training;Wheelchair mobility training;Manual techniques;Manual lymph drainage;Compression bandaging;Scar mobilization;Passive range of motion;Dry needling;Energy conservation;Splinting;Taping;Spinal Manipulations;Joint Manipulations    PT Next Visit Plan f/u with HHPT referral for PT/wound care, possibly obtain referral for wound care, check wound, assess transfers, don/doff prosthetic, f/u with shrinker use, further assess hip mobility/strength    PT Home Exercise Plan begin next session    Recommended Other Services HHPT, Wound care    Consulted and Agree with Plan of Care Patient;Family member/caregiver    Family Member Consulted Daughter Melanie           Patient will benefit from skilled therapeutic intervention in order to improve the following deficits and impairments:  Abnormal gait,Decreased endurance,Prosthetic Dependency,Decreased activity tolerance,Decreased knowledge of use of DME,Decreased strength,Pain,Decreased balance,Decreased mobility,Difficulty walking  Visit Diagnosis: Muscle weakness (generalized)  Other abnormalities of gait and mobility  Other symptoms and signs involving the musculoskeletal system     Problem List Patient  Active Problem List   Diagnosis Date Noted  . SBO (small bowel obstruction) ([place]) 01/08/2020  . Hyperammonemia ([place]) 09/07/2019  . Transaminitis 09/07/2019  . Lactic acidosis 09/07/2019  . Elevated INR 09/07/2019  . Abdominal pain 09/07/2019  . Radiation proctitis 07/28/2019  . Status post thoracentesis   . Acute on chronic anemia   . Pleural effusion   . Supratherapeutic INR   . Urinary retention   . Radiation cystitis   . Labile blood pressure   . Labile blood glucose   . Anxiety state   . Controlled type 2 diabetes mellitus with hyperglycemia, with long-term current use of insulin ([place])   . Hyponatremia   . Thrombocytopenia ([place])   . Phantom limb pain ([place])   . Postoperative pain   . Amputation above knee ([place]) 07/04/2019  . S/P right knee disarticulation amputation 06/29/2019  . Above knee amputation of right lower extremity ([place]) 06/29/2019  . Hyperglycemia   . Failed total right knee replacement ([place])   . Hepatic encephalopathy ([place]) 12/28/2018  . UTI (urinary tract infection) 12/28/2018  . Bradycardia 12/28/2018  . Hyperkalemia 12/28/2018  . Overweight (BMI 25.0-29.9) 11/23/2018  . Mechanical problem with extremity 11/22/2018  . Cirrhosis of liver with ascites ([place]) 11/09/2018  . Nausea without vomiting 10/26/2018  . Ascites   . Rectal bleeding   . AKI (acute kidney injury) ([place]) 10/11/2018  . Lower GI bleed 10/10/2018  . Anasarca 10/10/2018  . Hypoalbuminemia 10/10/2018  . Depression 09/28/2018  . Acute hepatic encephalopathy 09/28/2018  . Cirrhosis ([place]) 09/28/2018  . Periprosthetic fracture around internal prosthetic right knee joint 09/24/2018  . S/P right TKA 09/22/2018  . Status post total knee replacement, right 09/22/2018  . DM2 (diabetes mellitus, type  2) ([place]) 07/11/2015  . Hypertension 07/11/2015  . Hyperlipidemia 07/11/2015    4:17 PM, 04/22/20 Mearl Latin PT, DPT Physical Therapist at [place], [place], [address] Phone: [phone]   Fax:  [phone]  Name: Brittany Russo MRN: [id number] Date of Birth: 04/04/1945

## 2020-04-30 DIAGNOSIS — Z9181 History of falling: Secondary | ICD-10-CM | POA: Diagnosis not present

## 2020-04-30 DIAGNOSIS — L89152 Pressure ulcer of sacral region, stage 2: Secondary | ICD-10-CM | POA: Diagnosis not present

## 2020-04-30 DIAGNOSIS — I10 Essential (primary) hypertension: Secondary | ICD-10-CM | POA: Diagnosis not present

## 2020-04-30 DIAGNOSIS — Z794 Long term (current) use of insulin: Secondary | ICD-10-CM | POA: Diagnosis not present

## 2020-04-30 DIAGNOSIS — Z7984 Long term (current) use of oral hypoglycemic drugs: Secondary | ICD-10-CM | POA: Diagnosis not present

## 2020-04-30 DIAGNOSIS — K7460 Unspecified cirrhosis of liver: Secondary | ICD-10-CM | POA: Diagnosis not present

## 2020-04-30 DIAGNOSIS — M1712 Unilateral primary osteoarthritis, left knee: Secondary | ICD-10-CM | POA: Diagnosis not present

## 2020-04-30 DIAGNOSIS — F32A Depression, unspecified: Secondary | ICD-10-CM | POA: Diagnosis not present

## 2020-04-30 DIAGNOSIS — E785 Hyperlipidemia, unspecified: Secondary | ICD-10-CM | POA: Diagnosis not present

## 2020-04-30 DIAGNOSIS — F419 Anxiety disorder, unspecified: Secondary | ICD-10-CM | POA: Diagnosis not present

## 2020-04-30 DIAGNOSIS — K7290 Hepatic failure, unspecified without coma: Secondary | ICD-10-CM | POA: Diagnosis not present

## 2020-04-30 DIAGNOSIS — Z89611 Acquired absence of right leg above knee: Secondary | ICD-10-CM | POA: Diagnosis not present

## 2020-04-30 DIAGNOSIS — Z4781 Encounter for orthopedic aftercare following surgical amputation: Secondary | ICD-10-CM | POA: Diagnosis not present

## 2020-04-30 DIAGNOSIS — E119 Type 2 diabetes mellitus without complications: Secondary | ICD-10-CM | POA: Diagnosis not present

## 2020-05-04 DIAGNOSIS — E119 Type 2 diabetes mellitus without complications: Secondary | ICD-10-CM | POA: Diagnosis not present

## 2020-05-04 DIAGNOSIS — M1712 Unilateral primary osteoarthritis, left knee: Secondary | ICD-10-CM | POA: Diagnosis not present

## 2020-05-04 DIAGNOSIS — K7460 Unspecified cirrhosis of liver: Secondary | ICD-10-CM | POA: Diagnosis not present

## 2020-05-04 DIAGNOSIS — I10 Essential (primary) hypertension: Secondary | ICD-10-CM | POA: Diagnosis not present

## 2020-05-04 DIAGNOSIS — L89152 Pressure ulcer of sacral region, stage 2: Secondary | ICD-10-CM | POA: Diagnosis not present

## 2020-05-04 DIAGNOSIS — Z4781 Encounter for orthopedic aftercare following surgical amputation: Secondary | ICD-10-CM | POA: Diagnosis not present

## 2020-05-06 ENCOUNTER — Telehealth (HOSPITAL_COMMUNITY): Payer: Self-pay | Admitting: Physical Therapy

## 2020-05-06 ENCOUNTER — Ambulatory Visit (HOSPITAL_COMMUNITY): Payer: Medicare Other | Attending: Orthopedic Surgery | Admitting: Physical Therapy

## 2020-05-06 DIAGNOSIS — M1712 Unilateral primary osteoarthritis, left knee: Secondary | ICD-10-CM | POA: Diagnosis not present

## 2020-05-06 DIAGNOSIS — E119 Type 2 diabetes mellitus without complications: Secondary | ICD-10-CM | POA: Diagnosis not present

## 2020-05-06 DIAGNOSIS — L89152 Pressure ulcer of sacral region, stage 2: Secondary | ICD-10-CM | POA: Diagnosis not present

## 2020-05-06 DIAGNOSIS — K7460 Unspecified cirrhosis of liver: Secondary | ICD-10-CM | POA: Diagnosis not present

## 2020-05-06 DIAGNOSIS — Z4781 Encounter for orthopedic aftercare following surgical amputation: Secondary | ICD-10-CM | POA: Diagnosis not present

## 2020-05-06 DIAGNOSIS — I10 Essential (primary) hypertension: Secondary | ICD-10-CM | POA: Diagnosis not present

## 2020-05-06 NOTE — Telephone Encounter (Signed)
Pt did not show for appointment.  Called daughter [name]) who states she has been transitioned to home health for a few weeks with plans to return to outpatient.  Explained she would need a new order if returns after 2/28.  Also explained we would need an order to treat her sacral wound if that is not healed by this point.  Daugter verbalized understanding.  Teena Irani, PTA/CLT [phone]

## 2020-05-07 DIAGNOSIS — K7460 Unspecified cirrhosis of liver: Secondary | ICD-10-CM | POA: Diagnosis not present

## 2020-05-07 DIAGNOSIS — I10 Essential (primary) hypertension: Secondary | ICD-10-CM | POA: Diagnosis not present

## 2020-05-07 DIAGNOSIS — E119 Type 2 diabetes mellitus without complications: Secondary | ICD-10-CM | POA: Diagnosis not present

## 2020-05-07 DIAGNOSIS — Z4781 Encounter for orthopedic aftercare following surgical amputation: Secondary | ICD-10-CM | POA: Diagnosis not present

## 2020-05-07 DIAGNOSIS — L89152 Pressure ulcer of sacral region, stage 2: Secondary | ICD-10-CM | POA: Diagnosis not present

## 2020-05-07 DIAGNOSIS — M1712 Unilateral primary osteoarthritis, left knee: Secondary | ICD-10-CM | POA: Diagnosis not present

## 2020-05-13 DIAGNOSIS — L89152 Pressure ulcer of sacral region, stage 2: Secondary | ICD-10-CM | POA: Diagnosis not present

## 2020-05-13 DIAGNOSIS — M1712 Unilateral primary osteoarthritis, left knee: Secondary | ICD-10-CM | POA: Diagnosis not present

## 2020-05-13 DIAGNOSIS — Z4781 Encounter for orthopedic aftercare following surgical amputation: Secondary | ICD-10-CM | POA: Diagnosis not present

## 2020-05-13 DIAGNOSIS — I10 Essential (primary) hypertension: Secondary | ICD-10-CM | POA: Diagnosis not present

## 2020-05-13 DIAGNOSIS — K7460 Unspecified cirrhosis of liver: Secondary | ICD-10-CM | POA: Diagnosis not present

## 2020-05-13 DIAGNOSIS — E119 Type 2 diabetes mellitus without complications: Secondary | ICD-10-CM | POA: Diagnosis not present

## 2020-05-14 DIAGNOSIS — L89152 Pressure ulcer of sacral region, stage 2: Secondary | ICD-10-CM | POA: Diagnosis not present

## 2020-05-14 DIAGNOSIS — K7460 Unspecified cirrhosis of liver: Secondary | ICD-10-CM | POA: Diagnosis not present

## 2020-05-14 DIAGNOSIS — M1712 Unilateral primary osteoarthritis, left knee: Secondary | ICD-10-CM | POA: Diagnosis not present

## 2020-05-14 DIAGNOSIS — E119 Type 2 diabetes mellitus without complications: Secondary | ICD-10-CM | POA: Diagnosis not present

## 2020-05-14 DIAGNOSIS — E876 Hypokalemia: Secondary | ICD-10-CM | POA: Diagnosis not present

## 2020-05-14 DIAGNOSIS — I10 Essential (primary) hypertension: Secondary | ICD-10-CM | POA: Diagnosis not present

## 2020-05-14 DIAGNOSIS — Z4781 Encounter for orthopedic aftercare following surgical amputation: Secondary | ICD-10-CM | POA: Diagnosis not present

## 2020-05-20 DIAGNOSIS — K7460 Unspecified cirrhosis of liver: Secondary | ICD-10-CM | POA: Diagnosis not present

## 2020-05-20 DIAGNOSIS — L89152 Pressure ulcer of sacral region, stage 2: Secondary | ICD-10-CM | POA: Diagnosis not present

## 2020-05-20 DIAGNOSIS — I10 Essential (primary) hypertension: Secondary | ICD-10-CM | POA: Diagnosis not present

## 2020-05-20 DIAGNOSIS — E119 Type 2 diabetes mellitus without complications: Secondary | ICD-10-CM | POA: Diagnosis not present

## 2020-05-20 DIAGNOSIS — M1712 Unilateral primary osteoarthritis, left knee: Secondary | ICD-10-CM | POA: Diagnosis not present

## 2020-05-20 DIAGNOSIS — Z4781 Encounter for orthopedic aftercare following surgical amputation: Secondary | ICD-10-CM | POA: Diagnosis not present

## 2020-05-21 DIAGNOSIS — Z4781 Encounter for orthopedic aftercare following surgical amputation: Secondary | ICD-10-CM | POA: Diagnosis not present

## 2020-05-21 DIAGNOSIS — K7460 Unspecified cirrhosis of liver: Secondary | ICD-10-CM | POA: Diagnosis not present

## 2020-05-21 DIAGNOSIS — I10 Essential (primary) hypertension: Secondary | ICD-10-CM | POA: Diagnosis not present

## 2020-05-21 DIAGNOSIS — N390 Urinary tract infection, site not specified: Secondary | ICD-10-CM | POA: Diagnosis not present

## 2020-05-21 DIAGNOSIS — L89152 Pressure ulcer of sacral region, stage 2: Secondary | ICD-10-CM | POA: Diagnosis not present

## 2020-05-21 DIAGNOSIS — M1712 Unilateral primary osteoarthritis, left knee: Secondary | ICD-10-CM | POA: Diagnosis not present

## 2020-05-21 DIAGNOSIS — E119 Type 2 diabetes mellitus without complications: Secondary | ICD-10-CM | POA: Diagnosis not present

## 2020-05-23 DIAGNOSIS — I10 Essential (primary) hypertension: Secondary | ICD-10-CM | POA: Diagnosis not present

## 2020-05-23 DIAGNOSIS — Z4781 Encounter for orthopedic aftercare following surgical amputation: Secondary | ICD-10-CM | POA: Diagnosis not present

## 2020-05-23 DIAGNOSIS — M1712 Unilateral primary osteoarthritis, left knee: Secondary | ICD-10-CM | POA: Diagnosis not present

## 2020-05-23 DIAGNOSIS — E119 Type 2 diabetes mellitus without complications: Secondary | ICD-10-CM | POA: Diagnosis not present

## 2020-05-23 DIAGNOSIS — L89152 Pressure ulcer of sacral region, stage 2: Secondary | ICD-10-CM | POA: Diagnosis not present

## 2020-05-23 DIAGNOSIS — K7460 Unspecified cirrhosis of liver: Secondary | ICD-10-CM | POA: Diagnosis not present

## 2020-05-28 DIAGNOSIS — E119 Type 2 diabetes mellitus without complications: Secondary | ICD-10-CM | POA: Diagnosis not present

## 2020-05-28 DIAGNOSIS — L89152 Pressure ulcer of sacral region, stage 2: Secondary | ICD-10-CM | POA: Diagnosis not present

## 2020-05-28 DIAGNOSIS — M1712 Unilateral primary osteoarthritis, left knee: Secondary | ICD-10-CM | POA: Diagnosis not present

## 2020-05-28 DIAGNOSIS — I10 Essential (primary) hypertension: Secondary | ICD-10-CM | POA: Diagnosis not present

## 2020-05-28 DIAGNOSIS — Z4781 Encounter for orthopedic aftercare following surgical amputation: Secondary | ICD-10-CM | POA: Diagnosis not present

## 2020-05-28 DIAGNOSIS — K7460 Unspecified cirrhosis of liver: Secondary | ICD-10-CM | POA: Diagnosis not present

## 2020-05-30 DIAGNOSIS — K7460 Unspecified cirrhosis of liver: Secondary | ICD-10-CM | POA: Diagnosis not present

## 2020-05-30 DIAGNOSIS — F32A Depression, unspecified: Secondary | ICD-10-CM | POA: Diagnosis not present

## 2020-05-30 DIAGNOSIS — K7290 Hepatic failure, unspecified without coma: Secondary | ICD-10-CM | POA: Diagnosis not present

## 2020-05-30 DIAGNOSIS — F419 Anxiety disorder, unspecified: Secondary | ICD-10-CM | POA: Diagnosis not present

## 2020-05-30 DIAGNOSIS — M1712 Unilateral primary osteoarthritis, left knee: Secondary | ICD-10-CM | POA: Diagnosis not present

## 2020-05-30 DIAGNOSIS — L89152 Pressure ulcer of sacral region, stage 2: Secondary | ICD-10-CM | POA: Diagnosis not present

## 2020-05-30 DIAGNOSIS — E785 Hyperlipidemia, unspecified: Secondary | ICD-10-CM | POA: Diagnosis not present

## 2020-05-30 DIAGNOSIS — Z9181 History of falling: Secondary | ICD-10-CM | POA: Diagnosis not present

## 2020-05-30 DIAGNOSIS — Z794 Long term (current) use of insulin: Secondary | ICD-10-CM | POA: Diagnosis not present

## 2020-05-30 DIAGNOSIS — I10 Essential (primary) hypertension: Secondary | ICD-10-CM | POA: Diagnosis not present

## 2020-05-30 DIAGNOSIS — Z7984 Long term (current) use of oral hypoglycemic drugs: Secondary | ICD-10-CM | POA: Diagnosis not present

## 2020-05-30 DIAGNOSIS — Z4781 Encounter for orthopedic aftercare following surgical amputation: Secondary | ICD-10-CM | POA: Diagnosis not present

## 2020-05-30 DIAGNOSIS — E119 Type 2 diabetes mellitus without complications: Secondary | ICD-10-CM | POA: Diagnosis not present

## 2020-05-30 DIAGNOSIS — Z89611 Acquired absence of right leg above knee: Secondary | ICD-10-CM | POA: Diagnosis not present

## 2020-06-03 DIAGNOSIS — M1712 Unilateral primary osteoarthritis, left knee: Secondary | ICD-10-CM | POA: Diagnosis not present

## 2020-06-03 DIAGNOSIS — K7460 Unspecified cirrhosis of liver: Secondary | ICD-10-CM | POA: Diagnosis not present

## 2020-06-03 DIAGNOSIS — L89152 Pressure ulcer of sacral region, stage 2: Secondary | ICD-10-CM | POA: Diagnosis not present

## 2020-06-03 DIAGNOSIS — Z4781 Encounter for orthopedic aftercare following surgical amputation: Secondary | ICD-10-CM | POA: Diagnosis not present

## 2020-06-03 DIAGNOSIS — E119 Type 2 diabetes mellitus without complications: Secondary | ICD-10-CM | POA: Diagnosis not present

## 2020-06-03 DIAGNOSIS — I10 Essential (primary) hypertension: Secondary | ICD-10-CM | POA: Diagnosis not present

## 2020-06-06 DIAGNOSIS — I10 Essential (primary) hypertension: Secondary | ICD-10-CM | POA: Diagnosis not present

## 2020-06-06 DIAGNOSIS — M1712 Unilateral primary osteoarthritis, left knee: Secondary | ICD-10-CM | POA: Diagnosis not present

## 2020-06-06 DIAGNOSIS — K7460 Unspecified cirrhosis of liver: Secondary | ICD-10-CM | POA: Diagnosis not present

## 2020-06-06 DIAGNOSIS — E119 Type 2 diabetes mellitus without complications: Secondary | ICD-10-CM | POA: Diagnosis not present

## 2020-06-06 DIAGNOSIS — L89152 Pressure ulcer of sacral region, stage 2: Secondary | ICD-10-CM | POA: Diagnosis not present

## 2020-06-06 DIAGNOSIS — Z4781 Encounter for orthopedic aftercare following surgical amputation: Secondary | ICD-10-CM | POA: Diagnosis not present

## 2020-06-10 DIAGNOSIS — K7460 Unspecified cirrhosis of liver: Secondary | ICD-10-CM | POA: Diagnosis not present

## 2020-06-10 DIAGNOSIS — E119 Type 2 diabetes mellitus without complications: Secondary | ICD-10-CM | POA: Diagnosis not present

## 2020-06-10 DIAGNOSIS — L89152 Pressure ulcer of sacral region, stage 2: Secondary | ICD-10-CM | POA: Diagnosis not present

## 2020-06-10 DIAGNOSIS — Z4781 Encounter for orthopedic aftercare following surgical amputation: Secondary | ICD-10-CM | POA: Diagnosis not present

## 2020-06-10 DIAGNOSIS — I10 Essential (primary) hypertension: Secondary | ICD-10-CM | POA: Diagnosis not present

## 2020-06-10 DIAGNOSIS — M1712 Unilateral primary osteoarthritis, left knee: Secondary | ICD-10-CM | POA: Diagnosis not present

## 2020-06-12 DIAGNOSIS — K7460 Unspecified cirrhosis of liver: Secondary | ICD-10-CM | POA: Diagnosis not present

## 2020-06-12 DIAGNOSIS — E119 Type 2 diabetes mellitus without complications: Secondary | ICD-10-CM | POA: Diagnosis not present

## 2020-06-12 DIAGNOSIS — I10 Essential (primary) hypertension: Secondary | ICD-10-CM | POA: Diagnosis not present

## 2020-06-12 DIAGNOSIS — L89152 Pressure ulcer of sacral region, stage 2: Secondary | ICD-10-CM | POA: Diagnosis not present

## 2020-06-12 DIAGNOSIS — M1712 Unilateral primary osteoarthritis, left knee: Secondary | ICD-10-CM | POA: Diagnosis not present

## 2020-06-12 DIAGNOSIS — Z4781 Encounter for orthopedic aftercare following surgical amputation: Secondary | ICD-10-CM | POA: Diagnosis not present

## 2020-06-14 DIAGNOSIS — R300 Dysuria: Secondary | ICD-10-CM | POA: Diagnosis not present

## 2020-06-19 DIAGNOSIS — I10 Essential (primary) hypertension: Secondary | ICD-10-CM | POA: Diagnosis not present

## 2020-06-19 DIAGNOSIS — K7581 Nonalcoholic steatohepatitis (NASH): Secondary | ICD-10-CM | POA: Diagnosis not present

## 2020-06-19 DIAGNOSIS — E1165 Type 2 diabetes mellitus with hyperglycemia: Secondary | ICD-10-CM | POA: Diagnosis not present

## 2020-06-19 DIAGNOSIS — E7849 Other hyperlipidemia: Secondary | ICD-10-CM | POA: Diagnosis not present

## 2020-06-21 DIAGNOSIS — F419 Anxiety disorder, unspecified: Secondary | ICD-10-CM | POA: Diagnosis not present

## 2020-06-21 DIAGNOSIS — E1165 Type 2 diabetes mellitus with hyperglycemia: Secondary | ICD-10-CM | POA: Diagnosis not present

## 2020-06-21 DIAGNOSIS — K7290 Hepatic failure, unspecified without coma: Secondary | ICD-10-CM | POA: Diagnosis not present

## 2020-06-21 DIAGNOSIS — I10 Essential (primary) hypertension: Secondary | ICD-10-CM | POA: Diagnosis not present

## 2020-06-21 DIAGNOSIS — S78119A Complete traumatic amputation at level between unspecified hip and knee, initial encounter: Secondary | ICD-10-CM | POA: Diagnosis not present

## 2020-06-21 DIAGNOSIS — N3090 Cystitis, unspecified without hematuria: Secondary | ICD-10-CM | POA: Diagnosis not present

## 2020-06-21 DIAGNOSIS — K7581 Nonalcoholic steatohepatitis (NASH): Secondary | ICD-10-CM | POA: Diagnosis not present

## 2020-06-26 ENCOUNTER — Ambulatory Visit (HOSPITAL_COMMUNITY): Payer: Medicare Other | Attending: Orthopedic Surgery | Admitting: Physical Therapy

## 2020-06-26 ENCOUNTER — Other Ambulatory Visit: Payer: Self-pay

## 2020-06-26 ENCOUNTER — Encounter (HOSPITAL_COMMUNITY): Payer: Self-pay | Admitting: Physical Therapy

## 2020-06-26 DIAGNOSIS — Z89611 Acquired absence of right leg above knee: Secondary | ICD-10-CM

## 2020-06-26 DIAGNOSIS — R2689 Other abnormalities of gait and mobility: Secondary | ICD-10-CM | POA: Insufficient documentation

## 2020-06-26 DIAGNOSIS — M6281 Muscle weakness (generalized): Secondary | ICD-10-CM | POA: Diagnosis not present

## 2020-06-26 NOTE — Therapy (Signed)
[place], [place], [address] Phone: [phone]   Fax:  [phone]  Physical Therapy Evaluation  Patient Details  Name: Brittany Russo MRN: [id number] Date of Birth: Jul 27, 1945 Referring Provider (PT): Paralee Cancel MD   Encounter Date: 06/26/2020   PT End of Session - 06/26/20 1630    Visit Number 1    Number of Visits 16    Date for PT Re-Evaluation 08/21/20    Authorization Type Primary Medicare Secondary Medicaid    Progress Note Due on Visit 10    PT Start Time 1520    PT Stop Time 1610    PT Time Calculation (min) 50 min    Equipment Utilized During Treatment Gait belt    Activity Tolerance Patient limited by fatigue    Behavior During Therapy [facility] for tasks assessed/performed           Past Medical History:  Diagnosis Date  . Acute and subacute hepatic failure without coma   . Allergy   . Anasarca   . Anxiety   . Arthritis   . Ascites 09/2018   no SBP on 850 cc tap 10/13/18  . Asthma    allery induced  . Cirrhosis of liver ([place]) ~ 2004   from NAFLD.  Dx ~ 2004.  Dr Dorcas Mcmurray at [facility]/    . Depression   . Diabetes mellitus without complication ([place])    type 2  . Eczema of both hands   . Endometrial cancer ([place]) 1996, 2003   hysterectomy 1996.  recurrence 2003, treated with radiation.    . Generalized edema   . GI hemorrhage   . 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 in [place]  . Hyperlipidemia   . Hypertension   . Muscle weakness   . Seizures ([place])    last seizure June 07, 2014   . Thrombocytopenia ([place])   . Thyroid nodule     Past Surgical History:  Procedure Laterality Date  . AMPUTATION Right 06/29/2019   Procedure: AMPUTATION ABOVE KNEE through knee;  Surgeon: Paralee Cancel, MD;  [facility];  Service: Orthopedics;  Laterality: Right;  2 hrs  . CATARACT EXTRACTION W/PHACO Left 06/04/2017   Procedure: CATARACT  EXTRACTION PHACO AND INTRAOCULAR LENS PLACEMENT (IOC);  Surgeon: Baruch Goldmann, MD;  Location: [facility];  Service: Ophthalmology;  Laterality: Left;  CDE: 3.90  . CATARACT EXTRACTION W/PHACO Right 07/15/2017   Procedure: CATARACT EXTRACTION PHACO AND INTRAOCULAR LENS PLACEMENT (IOC);  Surgeon: Baruch Goldmann, MD;  Location: [facility];  Service: Ophthalmology;  Laterality: Right;  CDE: 7.60  . COLONOSCOPY  06/2007   in [place]. for hematochzia.  radiation proctitis  . DILATION AND CURETTAGE OF UTERUS    . ESOPHAGOGASTRODUODENOSCOPY  2016  . ESOPHAGOGASTRODUODENOSCOPY (EGD) WITH PROPOFOL N/A 12/30/2018   Procedure: EGD;  Surgeon: Clarene Essex, MD;  [facility];  Service: Endoscopy;  Laterality: N/A;  . EXCISIONAL TOTAL KNEE ARTHROPLASTY Right 01/03/2019   Procedure: EXCISIONAL TOTAL KNEE ARTHROPLASTY;  Surgeon: Paralee Cancel, MD;  [facility];  Service: Orthopedics;  Laterality: Right;  . FLEXIBLE SIGMOIDOSCOPY N/A 12/30/2018   Procedure: FLEXIBLE SIGMOIDOSCOPY;  Surgeon: Clarene Essex, MD;  [facility];  Service: Endoscopy;  Laterality: N/A;  . IR PARACENTESIS  10/13/2018  . IR PARACENTESIS  07/21/2019  . IR THORACENTESIS ASP PLEURAL SPACE W/IMG GUIDE  07/19/2019  . STERIOD INJECTION Left 06/29/2019   Procedure:  LEFT KNEE INJECTION;  Surgeon: Paralee Cancel, MD;  [facility];  Service: Orthopedics;  Laterality: Left;  . TOTAL ABDOMINAL HYSTERECTOMY  1996  . TOTAL KNEE ARTHROPLASTY Right 09/22/2018   Procedure: TOTAL KNEE ARTHROPLASTY;  Surgeon: Paralee Cancel, MD;  [facility];  Service: Orthopedics;  Laterality: Right;  70 mins  . TOTAL KNEE REVISION Right 09/27/2018   Procedure: TOTAL KNEE REVISION;  Surgeon: Paralee Cancel, MD;  [facility];  Service: Orthopedics;  Laterality: Right;  . TOTAL KNEE REVISION Right 11/22/2018   Procedure: repair right knee extensor mechanism;  Surgeon: Paralee Cancel, MD;  [facility];  Service: Orthopedics;  Laterality: Right;  90 mins     There were no vitals filed for this visit.    Subjective Assessment - 06/26/20 1528    Subjective Patient presents to physical therapy with her husband and her daughter for gait training with prosthetic. She has been to this clinic previously but was unable to attend due to transportation issues. She returns with new referral for strengthening and gait training with prosthetic limb. Her family members state they would like her to do as much as she is able. They say she has not been walking or using prosthetic.    Patient is accompained by: Family member    Limitations Standing;Walking;House hold activities    Patient Stated Goals to walk    Currently in Pain? No/denies              [facility] PT Assessment - 06/26/20 0001      Assessment   Medical Diagnosis weakness, gait training    Referring Provider (PT) Paralee Cancel MD    Onset Date/Surgical Date 06/29/19    Prior Therapy Inpatient and HHPT      Precautions   Precautions Fall      Balance Screen   Has the patient fallen in the past 6 months No      Prior Function   Level of Independence Independent with basic ADLs      Cognition   Overall Cognitive Status Within Functional Limits for tasks assessed      Observation/Other Assessments   Observations Presents in wheelchair, LT AFO for footdrop      Strength   Strength Assessment Site Ankle    Right Hip Flexion 3+/5    Left Hip Flexion 4/5    Left Knee Extension 3+/5    Right/Left Ankle Right;Left    Left Ankle Dorsiflexion 3-/5      Transfers   Transfers Stand to Sit;Stand Pivot Transfers;Lateral/Scoot Transfers;Sit to Stand    Sit to Stand --   unable   Stand Pivot Transfers --   unable, apprehensive   Lateral/Scoot Transfers 4: Min guard    Comments requires frequent cueing for hand and foot placement, weightshift      Ambulation/Gait   Ambulation/Gait --   Unable                     Objective measurements completed on examination: See above  findings.       [facility] Adult PT Treatment/Exercise - 06/26/20 0001      Exercises   Exercises Knee/Hip      Knee/Hip Exercises: Seated   Long Arc Quad Left;5 reps    Marching Both;5 reps                  PT Education - 06/26/20 1629    Education Details on evaluation findings, POC and HEP  Person(s) Educated Patient;Spouse;Child(ren)    Methods Explanation    Comprehension Verbalized understanding            PT Short Term Goals - 06/26/20 1634      PT SHORT TERM GOAL #1   Title Patient will be independent with HEP in order to improve functional outcomes.    Time 2    Period Weeks    Status New    Target Date 07/10/20             PT Long Term Goals - 06/26/20 1635      PT LONG TERM GOAL #1   Title Patient will have equal to or > 4/5 MMT throughout LLE (except ankle DF) to improve ability to perform functional mobility,ambulation and ADLs.    Time 8    Period Weeks    Status New    Target Date 08/21/20      PT LONG TERM GOAL #2   Title Patient will be able to perform sit to stand with Mod I using prosthetic for improved functional mobility and ease of toiletting.    Time 8    Period Weeks    Status New    Target Date 08/21/20      PT LONG TERM GOAL #3   Title Patient will be able to stand with LRAD for at least 2 minutes to reduce the risk of pressure injury.    Time 8    Period Weeks    Status New    Target Date 08/21/20      PT LONG TERM GOAL #4   Title Patient will be able to ambulate 25 feet for improved transfers, toileting, navigating in home.    Time 8    Period Weeks    Status New    Target Date 08/21/20                  Plan - 06/26/20 1631    Clinical Impression Statement Patient is a 75 y.o. female who presents to physical therapy for strengthening and prosthetic gait training. Patient demonstrates decreased strength, ROM restriction, reduced activity tolerance and increased perceived exertion with activity which are  negatively impacting patient ability to perform ADLs and functional mobility tasks. Patient will benefit from skilled physical therapy services to address these deficits to improve level of function with ADLs and functional mobility tasks    Personal Factors and Comorbidities Age;Fitness;Past/Current Experience;Comorbidity 3+    Comorbidities DM, amputee, w/c bound, L knee pain    Examination-Activity Limitations Bathing;Bend;Locomotion Level;Lift;Transfers;Toileting;Stand;Stairs;Squat    Examination-Participation Restrictions Cleaning;Community Activity;Shop;Volunteer;Yard Work;Other;Meal Prep;Laundry;Driving    Stability/Clinical Decision Making Evolving/Moderate complexity    Clinical Decision Making Moderate    Rehab Potential Fair    PT Frequency 2x / week    PT Duration 8 weeks    PT Treatment/Interventions ADLs/Self Care Home Management;Aquatic Therapy;Cryotherapy;Electrical Stimulation;Moist Heat;Traction;Iontophoresis 23m/ml Dexamethasone;Ultrasound;DME Instruction;Gait training;Stair training;Functional mobility training;Therapeutic activities;Therapeutic exercise;Balance training;Neuromuscular re-education;Patient/family education;Orthotic Fit/Training;Prosthetic Training;Wheelchair mobility training;Manual techniques;Manual lymph drainage;Compression bandaging;Scar mobilization;Passive range of motion;Dry needling;Energy conservation;Splinting;Taping;Spinal Manipulations;Joint Manipulations    PT Next Visit Plan Review goals. Work on seated Le strength and transfers. Progress to stand/ pivot transfers as able. Strengthen LT quad. Progress to gait with prosthetic if/ when able    PT Home Exercise Plan Eval: seated march, LAQ    Consulted and Agree with Plan of Care Patient;Family member/caregiver    Family Member Consulted Daughter and husband           Patient will  benefit from skilled therapeutic intervention in order to improve the following deficits and impairments:  Abnormal  gait,Decreased endurance,Prosthetic Dependency,Decreased activity tolerance,Decreased knowledge of use of DME,Decreased strength,Pain,Decreased balance,Decreased mobility,Difficulty walking  Visit Diagnosis: Muscle weakness (generalized)  Other abnormalities of gait and mobility  S/P right knee disarticulation amputation     Problem List Patient Active Problem List   Diagnosis Date Noted  . SBO (small bowel obstruction) ([place]) 01/08/2020  . Hyperammonemia ([place]) 09/07/2019  . Transaminitis 09/07/2019  . Lactic acidosis 09/07/2019  . Elevated INR 09/07/2019  . Abdominal pain 09/07/2019  . Radiation proctitis 07/28/2019  . Status post thoracentesis   . Acute on chronic anemia   . Pleural effusion   . Supratherapeutic INR   . Urinary retention   . Radiation cystitis   . Labile blood pressure   . Labile blood glucose   . Anxiety state   . Controlled type 2 diabetes mellitus with hyperglycemia, with long-term current use of insulin ([place])   . Hyponatremia   . Thrombocytopenia ([place])   . Phantom limb pain ([place])   . Postoperative pain   . Amputation above knee ([place]) 07/04/2019  . S/P right knee disarticulation amputation 06/29/2019  . Above knee amputation of right lower extremity ([place]) 06/29/2019  . Hyperglycemia   . Failed total right knee replacement ([place])   . Hepatic encephalopathy ([place]) 12/28/2018  . UTI (urinary tract infection) 12/28/2018  . Bradycardia 12/28/2018  . Hyperkalemia 12/28/2018  . Overweight (BMI 25.0-29.9) 11/23/2018  . Mechanical problem with extremity 11/22/2018  . Cirrhosis of liver with ascites ([place]) 11/09/2018  . Nausea without vomiting 10/26/2018  . Ascites   . Rectal bleeding   . AKI (acute kidney injury) ([place]) 10/11/2018  . Lower GI bleed 10/10/2018  . Anasarca 10/10/2018  . Hypoalbuminemia 10/10/2018  . Depression 09/28/2018  . Acute hepatic encephalopathy 09/28/2018  . Cirrhosis ([place]) 09/28/2018  . Periprosthetic fracture around internal  prosthetic right knee joint 09/24/2018  . S/P right TKA 09/22/2018  . Status post total knee replacement, right 09/22/2018  . DM2 (diabetes mellitus, type 2) ([place]) 07/11/2015  . Hypertension 07/11/2015  . Hyperlipidemia 07/11/2015    4:41 PM, 06/26/20 Josue Hector PT DPT  Physical Therapist with [facility]  [phone] [place] [address] [place], [place], [address] Phone: [phone]   Fax:  [phone]  Name: KIMBREE CASANAS MRN: [id number] Date of Birth: 08-26-1945

## 2020-06-28 ENCOUNTER — Ambulatory Visit (HOSPITAL_COMMUNITY): Payer: Medicare Other | Admitting: Physical Therapy

## 2020-06-28 ENCOUNTER — Encounter (HOSPITAL_COMMUNITY): Payer: Self-pay | Admitting: Physical Therapy

## 2020-06-28 ENCOUNTER — Other Ambulatory Visit: Payer: Self-pay

## 2020-06-28 DIAGNOSIS — M6281 Muscle weakness (generalized): Secondary | ICD-10-CM | POA: Diagnosis not present

## 2020-06-28 DIAGNOSIS — Z89611 Acquired absence of right leg above knee: Secondary | ICD-10-CM | POA: Diagnosis not present

## 2020-06-28 DIAGNOSIS — R2689 Other abnormalities of gait and mobility: Secondary | ICD-10-CM | POA: Diagnosis not present

## 2020-06-28 NOTE — Therapy (Signed)
[place] [address] [place], [place], [address] Phone: [phone]   Fax:  [phone]  Physical Therapy Treatment  Patient Details  Name: Brittany Russo MRN: [id number] Date of Birth: May 02, 1945 Referring Provider (PT): Paralee Cancel MD   Encounter Date: 06/28/2020   PT End of Session - 06/28/20 1348    Visit Number 2    Number of Visits 16    Date for PT Re-Evaluation 08/21/20    Authorization Type Primary Medicare Secondary Medicaid    Progress Note Due on Visit 10    PT Start Time 1405    PT Stop Time 1445    PT Time Calculation (min) 40 min    Equipment Utilized During Treatment Gait belt    Activity Tolerance Patient limited by fatigue    Behavior During Therapy [facility] for tasks assessed/performed           Past Medical History:  Diagnosis Date  . Acute and subacute hepatic failure without coma   . Allergy   . Anasarca   . Anxiety   . Arthritis   . Ascites 09/2018   no SBP on 850 cc tap 10/13/18  . Asthma    allery induced  . Cirrhosis of liver ([place]) ~ 2004   from NAFLD.  Dx ~ 2004.  Dr Dorcas Mcmurray at [facility]/    . Depression   . Diabetes mellitus without complication ([place])    type 2  . Eczema of both hands   . Endometrial cancer ([place]) 1996, 2003   hysterectomy 1996.  recurrence 2003, treated with radiation.    . Generalized edema   . GI hemorrhage   . 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 in [place]  . Hyperlipidemia   . Hypertension   . Muscle weakness   . Seizures ([place])    last seizure June 07, 2014   . Thrombocytopenia ([place])   . Thyroid nodule     Past Surgical History:  Procedure Laterality Date  . AMPUTATION Right 06/29/2019   Procedure: AMPUTATION ABOVE KNEE through knee;  Surgeon: Paralee Cancel, MD;  [facility];  Service: Orthopedics;  Laterality: Right;  2 hrs  . CATARACT EXTRACTION W/PHACO Left 06/04/2017   Procedure: CATARACT  EXTRACTION PHACO AND INTRAOCULAR LENS PLACEMENT (IOC);  Surgeon: Baruch Goldmann, MD;  [facility]: [facility];  Service: Ophthalmology;  Laterality: Left;  CDE: 3.90  . CATARACT EXTRACTION W/PHACO Right 07/15/2017   Procedure: CATARACT EXTRACTION PHACO AND INTRAOCULAR LENS PLACEMENT (IOC);  Surgeon: Baruch Goldmann, MD;  Location: [facility];  Service: Ophthalmology;  Laterality: Right;  CDE: 7.60  . COLONOSCOPY  06/2007   in [place]. for hematochzia.  radiation proctitis  . DILATION AND CURETTAGE OF UTERUS    . ESOPHAGOGASTRODUODENOSCOPY  2016  . ESOPHAGOGASTRODUODENOSCOPY (EGD) WITH PROPOFOL N/A 12/30/2018   Procedure: EGD;  Surgeon: Clarene Essex, MD;  [facility];  Service: Endoscopy;  Laterality: N/A;  . EXCISIONAL TOTAL KNEE ARTHROPLASTY Right 01/03/2019   Procedure: EXCISIONAL TOTAL KNEE ARTHROPLASTY;  Surgeon: Paralee Cancel, MD;  [facility];  Service: Orthopedics;  Laterality: Right;  . FLEXIBLE SIGMOIDOSCOPY N/A 12/30/2018   Procedure: FLEXIBLE SIGMOIDOSCOPY;  Surgeon: Clarene Essex, MD;  [facility];  Service: Endoscopy;  Laterality: N/A;  . IR PARACENTESIS  10/13/2018  . IR PARACENTESIS  07/21/2019  . IR THORACENTESIS ASP PLEURAL SPACE W/IMG GUIDE  07/19/2019  . STERIOD INJECTION Left 06/29/2019   Procedure:  LEFT KNEE INJECTION;  Surgeon: Paralee Cancel, MD;  [facility];  Service: Orthopedics;  Laterality: Left;  . TOTAL ABDOMINAL HYSTERECTOMY  1996  . TOTAL KNEE ARTHROPLASTY Right 09/22/2018   Procedure: TOTAL KNEE ARTHROPLASTY;  Surgeon: Paralee Cancel, MD;  [facility];  Service: Orthopedics;  Laterality: Right;  70 mins  . TOTAL KNEE REVISION Right 09/27/2018   Procedure: TOTAL KNEE REVISION;  Surgeon: Paralee Cancel, MD;  [facility];  Service: Orthopedics;  Laterality: Right;  . TOTAL KNEE REVISION Right 11/22/2018   Procedure: repair right knee extensor mechanism;  Surgeon: Paralee Cancel, MD;  [facility];  Service: Orthopedics;  Laterality: Right;  90 mins     There were no vitals filed for this visit.   Subjective Assessment - 06/28/20 1347    Subjective Pt states that she is not doing well when asked what is wrong pt replies life.    Patient is accompained by: Family member    Limitations Standing;Walking;House hold activities    Patient Stated Goals to walk    Currently in Pain? Yes   phantom pain varies goes to an 8/10   Pain Location Leg    Pain Orientation Right    Pain Descriptors / Indicators Pins and needles    Pain Type Phantom pain    Pain Onset More than a month ago    Pain Frequency Constant    Aggravating Factors  not sure                             [facility] Adult PT Treatment/Exercise - 06/28/20 0001      Transfers   Transfers Stand to [employer];Lateral/Scoot Transfers;Sit to Stand    Stand Pivot Transfers 2: Max assist      Knee/Hip Exercises: Seated   Other Seated Knee/Hip Exercises ab set/glut set, sit tall, hip adduction isometric all x 10    Other Seated Knee/Hip Exercises wheelchair push up x 10    Marching 10 reps      Knee/Hip Exercises: Supine   Quad Sets Left;10 reps    Straight Leg Raises Both;10 reps      Knee/Hip Exercises: Sidelying   Hip ABduction Both;10 reps      Knee/Hip Exercises: Prone   Hip Extension Both;10 reps                    PT Short Term Goals - 06/28/20 1431      PT SHORT TERM GOAL #1   Title Patient will be independent with HEP in order to improve functional outcomes.    Time 2    Period Weeks    Status On-going    Target Date 07/10/20             PT Long Term Goals - 06/28/20 1432      PT LONG TERM GOAL #1   Title Patient will have equal to or > 4/5 MMT throughout LLE (except ankle DF) to improve ability to perform functional mobility,ambulation and ADLs.    Time 8    Period Weeks    Status On-going      PT LONG TERM GOAL #2   Title Patient will be able to perform sit to stand with Mod I using prosthetic for  improved functional mobility and ease of toiletting.    Time 8    Period Weeks    Status On-going  PT LONG TERM GOAL #3   Title Patient will be able to stand with LRAD for at least 2 minutes to reduce the risk of pressure injury.    Time 8    Period Weeks    Status On-going      PT LONG TERM GOAL #4   Title Patient will be able to ambulate 25 feet for improved transfers, toileting, navigating in home.    Time 8    Period Weeks    Status On-going                 Plan - 06/28/20 1349    Clinical Impression Statement Evaluation and goals reviewed with pt. Pt comes to department without her prothesis.   Pt has Lt knee contracture of 15 degrees. Therapist focused on transfer training eith max assist needed for transver.Sibnificant gluteal weakness B, LE strengthening in this sesison .  Updated HEP    Personal Factors and Comorbidities Age;Fitness;Past/Current Experience;Comorbidity 3+    Comorbidities DM, amputee, w/c bound, L knee pain    Examination-Activity Limitations Bathing;Bend;Locomotion Level;Lift;Transfers;Toileting;Stand;Stairs;Squat    Examination-Participation Restrictions Cleaning;Community Activity;Shop;Volunteer;Yard Work;Other;Meal Prep;Laundry;Driving    Stability/Clinical Decision Making Evolving/Moderate complexity    Rehab Potential Fair    PT Frequency 2x / week    PT Duration 8 weeks    PT Treatment/Interventions ADLs/Self Care Home Management;Aquatic Therapy;Cryotherapy;Electrical Stimulation;Moist Heat;Traction;Iontophoresis 16m/ml Dexamethasone;Ultrasound;DME Instruction;Gait training;Stair training;Functional mobility training;Therapeutic activities;Therapeutic exercise;Balance training;Neuromuscular re-education;Patient/family education;Orthotic Fit/Training;Prosthetic Training;Wheelchair mobility training;Manual techniques;Manual lymph drainage;Compression bandaging;Scar mobilization;Passive range of motion;Dry needling;Energy  conservation;Splinting;Taping;Spinal Manipulations;Joint Manipulations    PT Next Visit Plan Work on seated Le strength and transfers. Progress to stand/ pivot transfers as able. Strengthen LT quad. Progress to gait with prosthetic if/ when able    PT Home Exercise Plan Eval: seated march, LAQ; 4/8:  Glut set, hip addutor set, sit to stand at table    Consulted and Agree with Plan of Care Patient;Family member/caregiver    Family Member Consulted Daughter and husband           Patient will benefit from skilled therapeutic intervention in order to improve the following deficits and impairments:  Abnormal gait,Decreased endurance,Prosthetic Dependency,Decreased activity tolerance,Decreased knowledge of use of DME,Decreased strength,Pain,Decreased balance,Decreased mobility,Difficulty walking  Visit Diagnosis: Muscle weakness (generalized)  Other abnormalities of gait and mobility  S/P right knee disarticulation amputation     Problem List Patient Active Problem List   Diagnosis Date Noted  . SBO (small bowel obstruction) ([place] 01/08/2020  . Hyperammonemia ([place] 09/07/2019  . Transaminitis 09/07/2019  . Lactic acidosis 09/07/2019  . Elevated INR 09/07/2019  . Abdominal pain 09/07/2019  . Radiation proctitis 07/28/2019  . Status post thoracentesis   . Acute on chronic anemia   . Pleural effusion   . Supratherapeutic INR   . Urinary retention   . Radiation cystitis   . Labile blood pressure   . Labile blood glucose   . Anxiety state   . Controlled type 2 diabetes mellitus with hyperglycemia, with long-term current use of insulin ([place]   . Hyponatremia   . Thrombocytopenia ([facility]   . Phantom limb pain ([facility]   . Postoperative pain   . Amputation above knee ([facility] 07/04/2019  . S/P right knee disarticulation amputation 06/29/2019  . Above knee amputation of right lower extremity ([place] 06/29/2019  . Hyperglycemia   . Failed total right knee replacement ([facility]   . Hepatic  encephalopathy ([place] 12/28/2018  . UTI (urinary tract infection) 12/28/2018  . Bradycardia 12/28/2018  .  Hyperkalemia 12/28/2018  . Overweight (BMI 25.0-29.9) 11/23/2018  . Mechanical problem with extremity 11/22/2018  . Cirrhosis of liver with ascites ([place]) 11/09/2018  . Nausea without vomiting 10/26/2018  . Ascites   . Rectal bleeding   . AKI (acute kidney injury) ([place]) 10/11/2018  . Lower GI bleed 10/10/2018  . Anasarca 10/10/2018  . Hypoalbuminemia 10/10/2018  . Depression 09/28/2018  . Acute hepatic encephalopathy 09/28/2018  . Cirrhosis ([place]) 09/28/2018  . Periprosthetic fracture around internal prosthetic right knee joint 09/24/2018  . S/P right TKA 09/22/2018  . Status post total knee replacement, right 09/22/2018  . DM2 (diabetes mellitus, type 2) ([place]) 07/11/2015  . Hypertension 07/11/2015  . Hyperlipidemia 07/11/2015    Rayetta Humphrey, PT CLT [phone] 06/28/2020, 2:44 PM  [facility] [address] [place], [place], [address] Phone: [phone]   Fax:  [phone]  Name: Brittany Russo MRN: [id number] Date of Birth: 01/19/1946

## 2020-07-01 DIAGNOSIS — R300 Dysuria: Secondary | ICD-10-CM | POA: Diagnosis not present

## 2020-07-02 ENCOUNTER — Ambulatory Visit (HOSPITAL_COMMUNITY): Payer: Medicare Other

## 2020-07-04 ENCOUNTER — Ambulatory Visit (HOSPITAL_COMMUNITY): Payer: Medicare Other

## 2020-07-09 ENCOUNTER — Encounter (HOSPITAL_COMMUNITY): Payer: Self-pay | Admitting: Physical Therapy

## 2020-07-09 ENCOUNTER — Ambulatory Visit (HOSPITAL_COMMUNITY): Payer: Medicare Other | Admitting: Physical Therapy

## 2020-07-09 ENCOUNTER — Other Ambulatory Visit: Payer: Self-pay

## 2020-07-09 DIAGNOSIS — Z89611 Acquired absence of right leg above knee: Secondary | ICD-10-CM | POA: Diagnosis not present

## 2020-07-09 DIAGNOSIS — M6281 Muscle weakness (generalized): Secondary | ICD-10-CM | POA: Diagnosis not present

## 2020-07-09 DIAGNOSIS — R2689 Other abnormalities of gait and mobility: Secondary | ICD-10-CM

## 2020-07-09 NOTE — Therapy (Signed)
[facility] [address] [place], [place], [address] Phone: [phone]   Fax:  [phone]  Physical Therapy Treatment  Patient Details  Name: Brittany Russo MRN: [id number] Date of Birth: 02/01/1946 Referring Provider (PT): Paralee Cancel MD   Encounter Date: 07/09/2020   PT End of Session - 07/09/20 1524    Visit Number 3    Number of Visits 16    Date for PT Re-Evaluation 08/21/20    Authorization Type Primary Medicare Secondary Medicaid    Progress Note Due on Visit 10    PT Start Time 1518    PT Stop Time 1605    PT Time Calculation (min) 47 min    Equipment Utilized During Treatment Gait belt    Activity Tolerance Patient limited by fatigue    Behavior During Therapy [facility] for tasks assessed/performed           Past Medical History:  Diagnosis Date  . Acute and subacute hepatic failure without coma   . Allergy   . Anasarca   . Anxiety   . Arthritis   . Ascites 09/2018   no SBP on 850 cc tap 10/13/18  . Asthma    allery induced  . Cirrhosis of liver ([place]) ~ 2004   from NAFLD.  Dx ~ 2004.  Dr Dorcas Mcmurray at [facility]/    . Depression   . Diabetes mellitus without complication ([place])    type 2  . Eczema of both hands   . Endometrial cancer ([place]) 1996, 2003   hysterectomy 1996.  recurrence 2003, treated with radiation.    . Generalized edema   . GI hemorrhage   . 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 in [place]  . Hyperlipidemia   . Hypertension   . Muscle weakness   . Seizures ([place])    last seizure June 07, 2014   . Thrombocytopenia ([place])   . Thyroid nodule     Past Surgical History:  Procedure Laterality Date  . AMPUTATION Right 06/29/2019   Procedure: AMPUTATION ABOVE KNEE through knee;  Surgeon: Paralee Cancel, MD;  Location: [facility];  Service: Orthopedics;  Laterality: Right;  2 hrs  . CATARACT EXTRACTION W/PHACO Left 06/04/2017   Procedure: CATARACT  EXTRACTION PHACO AND INTRAOCULAR LENS PLACEMENT (IOC);  Surgeon: Baruch Goldmann, MD;  Location: [facility];  Service: Ophthalmology;  Laterality: Left;  CDE: 3.90  . CATARACT EXTRACTION W/PHACO Right 07/15/2017   Procedure: CATARACT EXTRACTION PHACO AND INTRAOCULAR LENS PLACEMENT (IOC);  Surgeon: Baruch Goldmann, MD;  Location: [facility];  Service: Ophthalmology;  Laterality: Right;  CDE: 7.60  . COLONOSCOPY  06/2007   in [place]. for hematochzia.  radiation proctitis  . DILATION AND CURETTAGE OF UTERUS    . ESOPHAGOGASTRODUODENOSCOPY  2016  . ESOPHAGOGASTRODUODENOSCOPY (EGD) WITH PROPOFOL N/A 12/30/2018   Procedure: EGD;  Surgeon: Clarene Essex, MD;  [facility];  Service: Endoscopy;  Laterality: N/A;  . EXCISIONAL TOTAL KNEE ARTHROPLASTY Right 01/03/2019   Procedure: EXCISIONAL TOTAL KNEE ARTHROPLASTY;  Surgeon: Paralee Cancel, MD;  [facility];  Service: Orthopedics;  Laterality: Right;  . FLEXIBLE SIGMOIDOSCOPY N/A 12/30/2018   Procedure: FLEXIBLE SIGMOIDOSCOPY;  Surgeon: Clarene Essex, MD;  [facility];  Service: Endoscopy;  Laterality: N/A;  . IR PARACENTESIS  10/13/2018  . IR PARACENTESIS  07/21/2019  . IR THORACENTESIS ASP PLEURAL SPACE W/IMG GUIDE  07/19/2019  . STERIOD INJECTION Left 06/29/2019   Procedure:  LEFT KNEE INJECTION;  Surgeon: Paralee Cancel, MD;  [facility];  Service: Orthopedics;  Laterality: Left;  . TOTAL ABDOMINAL HYSTERECTOMY  1996  . TOTAL KNEE ARTHROPLASTY Right 09/22/2018   Procedure: TOTAL KNEE ARTHROPLASTY;  Surgeon: Paralee Cancel, MD;  [facility];  Service: Orthopedics;  Laterality: Right;  70 mins  . TOTAL KNEE REVISION Right 09/27/2018   Procedure: TOTAL KNEE REVISION;  Surgeon: Paralee Cancel, MD;  [facility]: [facility];  Service: Orthopedics;  Laterality: Right;  . TOTAL KNEE REVISION Right 11/22/2018   Procedure: repair right knee extensor mechanism;  Surgeon: Paralee Cancel, MD;  [facility]: [facility];  Service: Orthopedics;  Laterality: Right;  90 mins     There were no vitals filed for this visit.   Subjective Assessment - 07/09/20 1523    Subjective Patient says she was sick last week, did HEP when she could. She is feeling very weak today.    Patient is accompained by: Family member    Limitations Standing;Walking;House hold activities    Patient Stated Goals to walk    Currently in Pain? No/denies    Pain Onset More than a month ago                             [facility] Adult PT Treatment/Exercise - 07/09/20 0001      Bed Mobility   Bed Mobility Rolling Right;Rolling Left;Supine to Sit;Sit to Supine    Rolling Right Minimal Assistance - Patient > 75%    Rolling Left Minimal Assistance - Patient > 75%    Supine to Sit Moderate Assistance - Patient 50-74%    Sit to Supine Minimal Assistance - Patient > 75%      Transfers   Stand Pivot Transfers 3: Mod assist    Lateral/Scoot Transfers 4: Min assist      Knee/Hip Exercises: Seated   Other Seated Knee/Hip Exercises heel/ toe raise x20, LAQ x20, marching x20      Knee/Hip Exercises: Supine   Quad Sets Left;10 reps    Heel Slides Left;10 reps    Straight Leg Raises Left;10 reps      Knee/Hip Exercises: Sidelying   Clams x10 with assist, very challenged                    PT Short Term Goals - 06/28/20 1431      PT SHORT TERM GOAL #1   Title Patient will be independent with HEP in order to improve functional outcomes.    Time 2    Period Weeks    Status On-going    Target Date 07/10/20             PT Long Term Goals - 06/28/20 1432      PT LONG TERM GOAL #1   Title Patient will have equal to or > 4/5 MMT throughout LLE (except ankle DF) to improve ability to perform functional mobility,ambulation and ADLs.    Time 8    Period Weeks    Status On-going      PT LONG TERM GOAL #2   Title Patient will be able to perform sit to stand with Mod I using prosthetic for improved functional mobility and ease of toiletting.    Time 8     Period Weeks    Status On-going      PT LONG TERM GOAL #3   Title Patient will be able to stand with LRAD  for at least 2 minutes to reduce the risk of pressure injury.    Time 8    Period Weeks    Status On-going      PT LONG TERM GOAL #4   Title Patient will be able to ambulate 25 feet for improved transfers, toileting, navigating in home.    Time 8    Period Weeks    Status On-going                 Plan - 07/09/20 1621    Clinical Impression Statement Patient limited by fatigue today. She requires frequent verbal cues for form and encouragement for improved participation. She demos increased apprehension about transfers and bed mobility. She needed reassuring that therapist was guarding edge of bed so that she could not roll off of table. Patient shows core weakness and decreased activation with supine rolling transfers on mat. She needs verbal cues and Mod A for repositioning for lateral transfer using transfer board. Patient and her husband educated on all activity today. Patient will continue to benefit from skilled therapy services to reduce limitation and improve function ability.    Personal Factors and Comorbidities Age;Fitness;Past/Current Experience;Comorbidity 3+    Comorbidities DM, amputee, w/c bound, L knee pain    Examination-Activity Limitations Bathing;Bend;Locomotion Level;Lift;Transfers;Toileting;Stand;Stairs;Squat    Examination-Participation Restrictions Cleaning;Community Activity;Shop;Volunteer;Yard Work;Other;Meal Prep;Laundry;Driving    Stability/Clinical Decision Making Evolving/Moderate complexity    Rehab Potential Fair    PT Frequency 2x / week    PT Duration 8 weeks    PT Treatment/Interventions ADLs/Self Care Home Management;Aquatic Therapy;Cryotherapy;Electrical Stimulation;Moist Heat;Traction;Iontophoresis 22m/ml Dexamethasone;Ultrasound;DME Instruction;Gait training;Stair training;Functional mobility training;Therapeutic activities;Therapeutic  exercise;Balance training;Neuromuscular re-education;Patient/family education;Orthotic Fit/Training;Prosthetic Training;Wheelchair mobility training;Manual techniques;Manual lymph drainage;Compression bandaging;Scar mobilization;Passive range of motion;Dry needling;Energy conservation;Splinting;Taping;Spinal Manipulations;Joint Manipulations    PT Next Visit Plan Progress to stand/ pivot transfers as able. Strengthen LT quad. Progress to gait with prosthetic if/ when able. Continue seated core work    PT Home Exercise Plan Eval: seated march, LAQ; 4/8:  Glut set, hip addutor set, sit to stand at table 4/19 upright seated posturing without lumbar support for core activation    Consulted and Agree with Plan of Care Patient;Family member/caregiver    Family Member Consulted Daughter and husband           Patient will benefit from skilled therapeutic intervention in order to improve the following deficits and impairments:  Abnormal gait,Decreased endurance,Prosthetic Dependency,Decreased activity tolerance,Decreased knowledge of use of DME,Decreased strength,Pain,Decreased balance,Decreased mobility,Difficulty walking  Visit Diagnosis: Muscle weakness (generalized)  Other abnormalities of gait and mobility  S/P right knee disarticulation amputation     Problem List Patient Active Problem List   Diagnosis Date Noted  . SBO (small bowel obstruction) ([place] 01/08/2020  . Hyperammonemia ([place] 09/07/2019  . Transaminitis 09/07/2019  . Lactic acidosis 09/07/2019  . Elevated INR 09/07/2019  . Abdominal pain 09/07/2019  . Radiation proctitis 07/28/2019  . Status post thoracentesis   . Acute on chronic anemia   . Pleural effusion   . Supratherapeutic INR   . Urinary retention   . Radiation cystitis   . Labile blood pressure   . Labile blood glucose   . Anxiety state   . Controlled type 2 diabetes mellitus with hyperglycemia, with long-term current use of insulin ([facility]   . Hyponatremia    . [place]rombocytopenia ([place]   . Phantom limb pain ([place]   . Postoperative pain   . Amputation above knee ([place] 07/04/2019  . S/P right knee disarticulation amputation  06/29/2019  . Above knee amputation of right lower extremity ([place]) 06/29/2019  . Hyperglycemia   . Failed total right knee replacement ([place])   . Hepatic encephalopathy ([place]) 12/28/2018  . UTI (urinary tract infection) 12/28/2018  . Bradycardia 12/28/2018  . Hyperkalemia 12/28/2018  . Overweight (BMI 25.0-29.9) 11/23/2018  . Mechanical problem with extremity 11/22/2018  . Cirrhosis of liver with ascites ([place]) 11/09/2018  . Nausea without vomiting 10/26/2018  . Ascites   . Rectal bleeding   . AKI (acute kidney injury) ([place]) 10/11/2018  . Lower GI bleed 10/10/2018  . Anasarca 10/10/2018  . Hypoalbuminemia 10/10/2018  . Depression 09/28/2018  . Acute hepatic encephalopathy 09/28/2018  . Cirrhosis ([place]) 09/28/2018  . Periprosthetic fracture around internal prosthetic right knee joint 09/24/2018  . S/P right TKA 09/22/2018  . Status post total knee replacement, right 09/22/2018  . DM2 (diabetes mellitus, type 2) ([place]) 07/11/2015  . Hypertension 07/11/2015  . Hyperlipidemia 07/11/2015   4:34 PM, 07/09/20 Josue Hector PT DPT  Physical Therapist with [facility]  [phone] [place] [address] [place], [place], [address] Phone: [phone]   Fax:  [phone]  Name: Brittany Russo MRN: [id number] Date of Birth: Aug 22, 1945

## 2020-07-11 ENCOUNTER — Ambulatory Visit (HOSPITAL_COMMUNITY): Payer: Medicare Other | Admitting: Physical Therapy

## 2020-07-11 ENCOUNTER — Encounter (HOSPITAL_COMMUNITY): Payer: Self-pay

## 2020-07-11 ENCOUNTER — Other Ambulatory Visit: Payer: Self-pay

## 2020-07-16 ENCOUNTER — Ambulatory Visit (HOSPITAL_COMMUNITY): Payer: Medicare Other

## 2020-07-18 ENCOUNTER — Ambulatory Visit (HOSPITAL_COMMUNITY): Payer: Medicare Other

## 2020-07-23 ENCOUNTER — Ambulatory Visit (HOSPITAL_COMMUNITY): Payer: Medicare Other | Admitting: Physical Therapy

## 2020-07-23 DIAGNOSIS — R059 Cough, unspecified: Secondary | ICD-10-CM | POA: Diagnosis not present

## 2020-07-25 ENCOUNTER — Encounter (HOSPITAL_COMMUNITY): Payer: Medicare Other | Admitting: Physical Therapy

## 2020-07-30 ENCOUNTER — Encounter (HOSPITAL_COMMUNITY): Payer: Medicare Other

## 2020-08-01 ENCOUNTER — Encounter (HOSPITAL_COMMUNITY): Payer: Medicare Other

## 2020-08-02 DIAGNOSIS — Z993 Dependence on wheelchair: Secondary | ICD-10-CM | POA: Diagnosis not present

## 2020-08-02 DIAGNOSIS — E1165 Type 2 diabetes mellitus with hyperglycemia: Secondary | ICD-10-CM | POA: Diagnosis not present

## 2020-08-02 DIAGNOSIS — F419 Anxiety disorder, unspecified: Secondary | ICD-10-CM | POA: Diagnosis not present

## 2020-08-02 DIAGNOSIS — K7290 Hepatic failure, unspecified without coma: Secondary | ICD-10-CM | POA: Diagnosis not present

## 2020-08-02 DIAGNOSIS — Z794 Long term (current) use of insulin: Secondary | ICD-10-CM | POA: Diagnosis not present

## 2020-08-02 DIAGNOSIS — I10 Essential (primary) hypertension: Secondary | ICD-10-CM | POA: Diagnosis not present

## 2020-08-02 DIAGNOSIS — K7460 Unspecified cirrhosis of liver: Secondary | ICD-10-CM | POA: Diagnosis not present

## 2020-08-02 DIAGNOSIS — K7581 Nonalcoholic steatohepatitis (NASH): Secondary | ICD-10-CM | POA: Diagnosis not present

## 2020-08-02 DIAGNOSIS — Z7984 Long term (current) use of oral hypoglycemic drugs: Secondary | ICD-10-CM | POA: Diagnosis not present

## 2020-08-02 DIAGNOSIS — Z89611 Acquired absence of right leg above knee: Secondary | ICD-10-CM | POA: Diagnosis not present

## 2020-08-02 DIAGNOSIS — E785 Hyperlipidemia, unspecified: Secondary | ICD-10-CM | POA: Diagnosis not present

## 2020-08-02 DIAGNOSIS — N3090 Cystitis, unspecified without hematuria: Secondary | ICD-10-CM | POA: Diagnosis not present

## 2020-08-02 IMAGING — CR RIGHT KNEE - COMPLETE 4+ VIEW
4 series · 4 of 4 positions shown · non-contrast
Comparison: None.

CLINICAL DATA: Right knee replacement, discharged yesterday.
Twisting knee getting to the car at discharge with 2 falls tonight.
Right knee pain.

EXAM:
RIGHT KNEE - COMPLETE 4+ VIEW

[x knee ap right (1 of 3)]
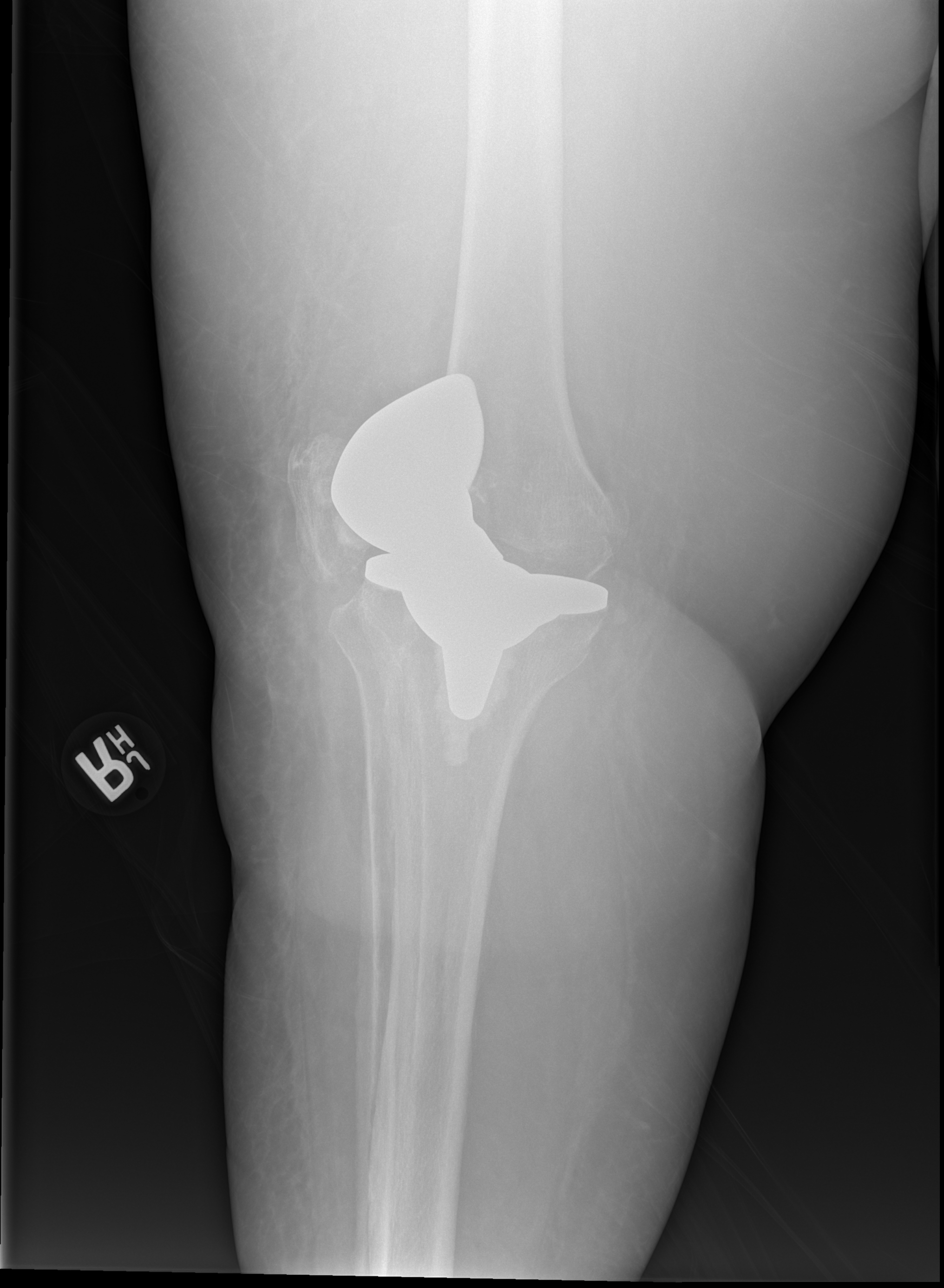

[x knee ap right (2 of 3)]
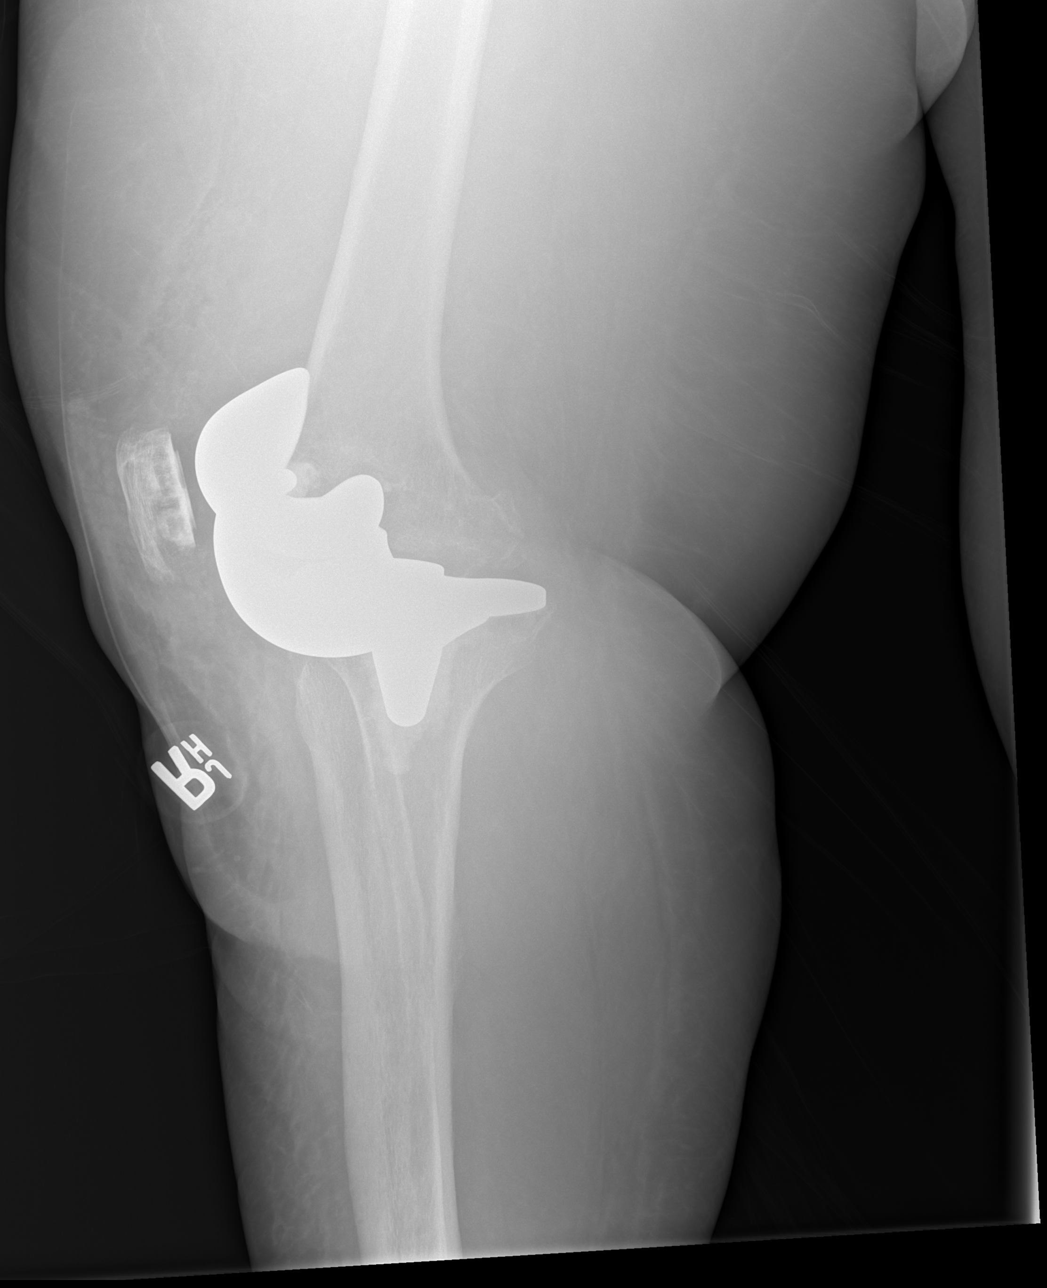

[x knee ap right (3 of 3)]
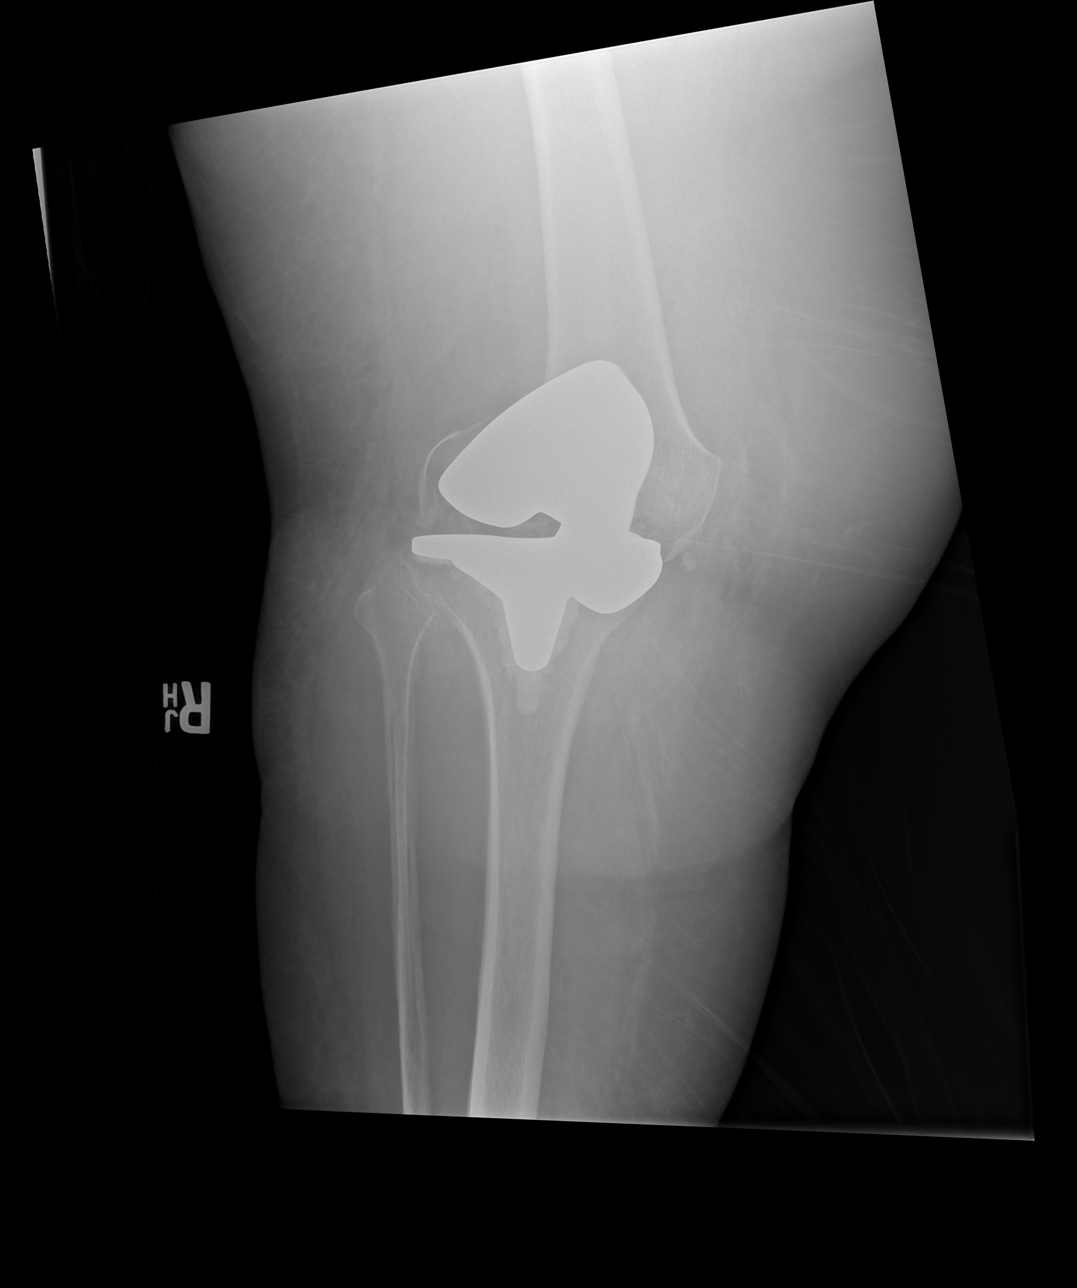

[x knee lat right]
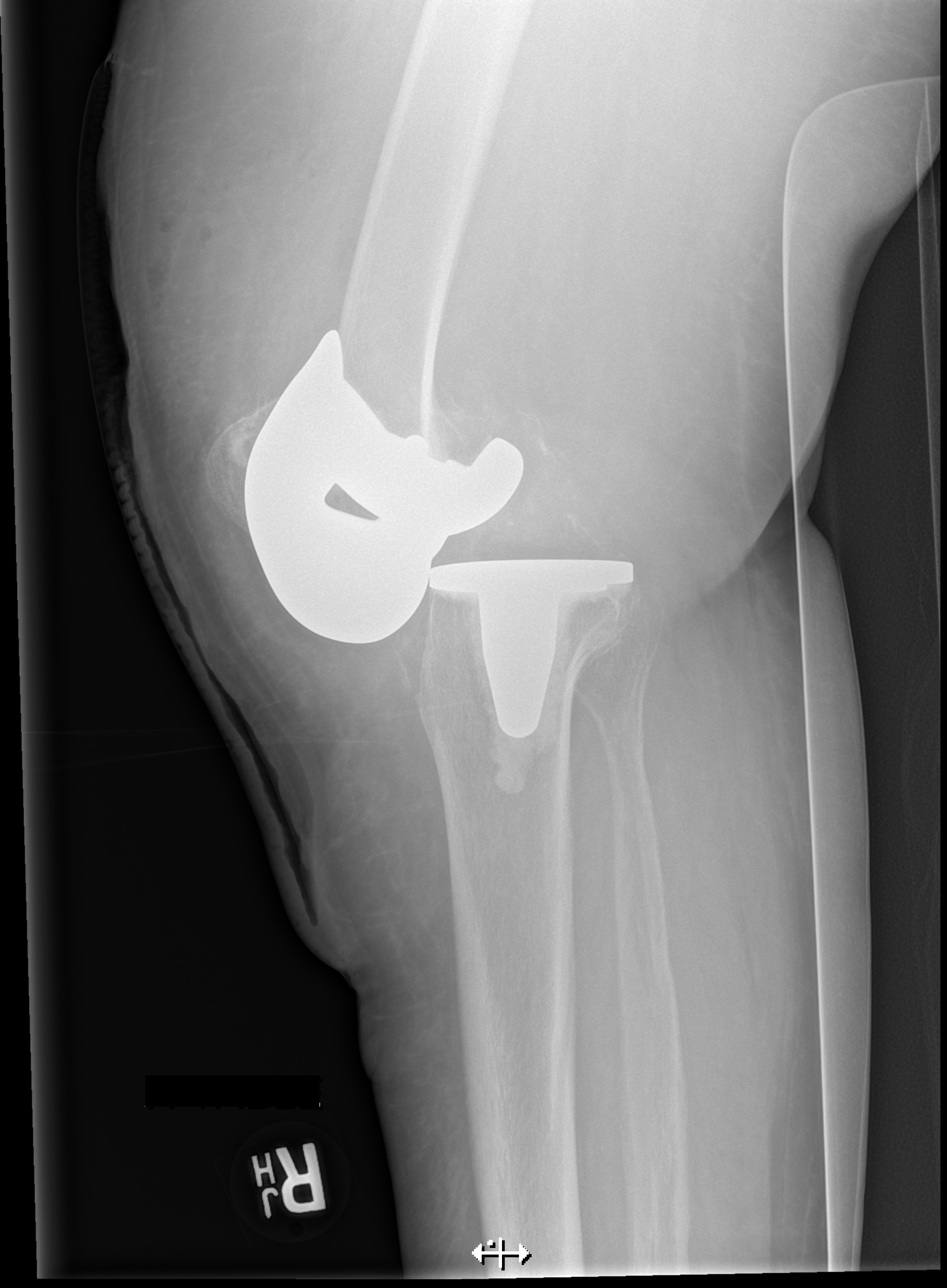

[4 of 4 positions shown; findings below may reference images not displayed]

FINDINGS: The femoral component of right knee arthroplasty is dislocated
anteriorly with respect to the tibial tray. Acute periprosthetic
fracture of the distal femur, fracture plane not well assessed due
to dislocation. Patella appears aligned with the femoral component,
there is been patellar resurfacing. Diffuse soft tissue edema.
IMPRESSION: Acute fracture dislocation of total right knee arthroplasty with
anterior dislocation of the femoral component and associated
periprosthetic femoral fracture.

## 2020-08-05 DIAGNOSIS — N3090 Cystitis, unspecified without hematuria: Secondary | ICD-10-CM | POA: Diagnosis not present

## 2020-08-05 DIAGNOSIS — K7581 Nonalcoholic steatohepatitis (NASH): Secondary | ICD-10-CM | POA: Diagnosis not present

## 2020-08-05 DIAGNOSIS — I10 Essential (primary) hypertension: Secondary | ICD-10-CM | POA: Diagnosis not present

## 2020-08-05 DIAGNOSIS — E1165 Type 2 diabetes mellitus with hyperglycemia: Secondary | ICD-10-CM | POA: Diagnosis not present

## 2020-08-05 DIAGNOSIS — K7290 Hepatic failure, unspecified without coma: Secondary | ICD-10-CM | POA: Diagnosis not present

## 2020-08-05 DIAGNOSIS — K7460 Unspecified cirrhosis of liver: Secondary | ICD-10-CM | POA: Diagnosis not present

## 2020-08-06 ENCOUNTER — Encounter (HOSPITAL_COMMUNITY): Payer: Medicare Other | Admitting: Physical Therapy

## 2020-08-06 DIAGNOSIS — E1165 Type 2 diabetes mellitus with hyperglycemia: Secondary | ICD-10-CM | POA: Diagnosis not present

## 2020-08-06 DIAGNOSIS — K7460 Unspecified cirrhosis of liver: Secondary | ICD-10-CM | POA: Diagnosis not present

## 2020-08-06 DIAGNOSIS — N3090 Cystitis, unspecified without hematuria: Secondary | ICD-10-CM | POA: Diagnosis not present

## 2020-08-06 DIAGNOSIS — K7581 Nonalcoholic steatohepatitis (NASH): Secondary | ICD-10-CM | POA: Diagnosis not present

## 2020-08-06 DIAGNOSIS — K7290 Hepatic failure, unspecified without coma: Secondary | ICD-10-CM | POA: Diagnosis not present

## 2020-08-06 DIAGNOSIS — I10 Essential (primary) hypertension: Secondary | ICD-10-CM | POA: Diagnosis not present

## 2020-08-08 ENCOUNTER — Encounter (HOSPITAL_COMMUNITY): Payer: Medicare Other | Admitting: Physical Therapy

## 2020-08-08 DIAGNOSIS — K7290 Hepatic failure, unspecified without coma: Secondary | ICD-10-CM | POA: Diagnosis not present

## 2020-08-08 DIAGNOSIS — K7460 Unspecified cirrhosis of liver: Secondary | ICD-10-CM | POA: Diagnosis not present

## 2020-08-08 DIAGNOSIS — E1165 Type 2 diabetes mellitus with hyperglycemia: Secondary | ICD-10-CM | POA: Diagnosis not present

## 2020-08-08 DIAGNOSIS — I10 Essential (primary) hypertension: Secondary | ICD-10-CM | POA: Diagnosis not present

## 2020-08-08 DIAGNOSIS — N3090 Cystitis, unspecified without hematuria: Secondary | ICD-10-CM | POA: Diagnosis not present

## 2020-08-08 DIAGNOSIS — K7581 Nonalcoholic steatohepatitis (NASH): Secondary | ICD-10-CM | POA: Diagnosis not present

## 2020-08-13 ENCOUNTER — Encounter (HOSPITAL_COMMUNITY): Payer: Medicare Other

## 2020-08-13 DIAGNOSIS — E1165 Type 2 diabetes mellitus with hyperglycemia: Secondary | ICD-10-CM | POA: Diagnosis not present

## 2020-08-13 DIAGNOSIS — K7460 Unspecified cirrhosis of liver: Secondary | ICD-10-CM | POA: Diagnosis not present

## 2020-08-13 DIAGNOSIS — I10 Essential (primary) hypertension: Secondary | ICD-10-CM | POA: Diagnosis not present

## 2020-08-13 DIAGNOSIS — K7290 Hepatic failure, unspecified without coma: Secondary | ICD-10-CM | POA: Diagnosis not present

## 2020-08-13 DIAGNOSIS — K7581 Nonalcoholic steatohepatitis (NASH): Secondary | ICD-10-CM | POA: Diagnosis not present

## 2020-08-13 DIAGNOSIS — N3090 Cystitis, unspecified without hematuria: Secondary | ICD-10-CM | POA: Diagnosis not present

## 2020-08-15 ENCOUNTER — Encounter (HOSPITAL_COMMUNITY): Payer: Medicare Other

## 2020-08-15 DIAGNOSIS — I10 Essential (primary) hypertension: Secondary | ICD-10-CM | POA: Diagnosis not present

## 2020-08-15 DIAGNOSIS — K7290 Hepatic failure, unspecified without coma: Secondary | ICD-10-CM | POA: Diagnosis not present

## 2020-08-15 DIAGNOSIS — K7581 Nonalcoholic steatohepatitis (NASH): Secondary | ICD-10-CM | POA: Diagnosis not present

## 2020-08-15 DIAGNOSIS — K7460 Unspecified cirrhosis of liver: Secondary | ICD-10-CM | POA: Diagnosis not present

## 2020-08-15 DIAGNOSIS — N390 Urinary tract infection, site not specified: Secondary | ICD-10-CM | POA: Diagnosis not present

## 2020-08-15 DIAGNOSIS — N3090 Cystitis, unspecified without hematuria: Secondary | ICD-10-CM | POA: Diagnosis not present

## 2020-08-15 DIAGNOSIS — E1165 Type 2 diabetes mellitus with hyperglycemia: Secondary | ICD-10-CM | POA: Diagnosis not present

## 2020-08-20 ENCOUNTER — Encounter (HOSPITAL_COMMUNITY): Payer: Medicare Other | Admitting: Physical Therapy

## 2020-08-20 DIAGNOSIS — K7460 Unspecified cirrhosis of liver: Secondary | ICD-10-CM | POA: Diagnosis not present

## 2020-08-20 DIAGNOSIS — K7290 Hepatic failure, unspecified without coma: Secondary | ICD-10-CM | POA: Diagnosis not present

## 2020-08-20 DIAGNOSIS — K7581 Nonalcoholic steatohepatitis (NASH): Secondary | ICD-10-CM | POA: Diagnosis not present

## 2020-08-20 DIAGNOSIS — N3090 Cystitis, unspecified without hematuria: Secondary | ICD-10-CM | POA: Diagnosis not present

## 2020-08-20 DIAGNOSIS — E1165 Type 2 diabetes mellitus with hyperglycemia: Secondary | ICD-10-CM | POA: Diagnosis not present

## 2020-08-20 DIAGNOSIS — I10 Essential (primary) hypertension: Secondary | ICD-10-CM | POA: Diagnosis not present

## 2020-08-22 ENCOUNTER — Encounter (HOSPITAL_COMMUNITY): Payer: Medicare Other | Admitting: Physical Therapy

## 2020-08-22 DIAGNOSIS — K7290 Hepatic failure, unspecified without coma: Secondary | ICD-10-CM | POA: Diagnosis not present

## 2020-08-22 DIAGNOSIS — K7581 Nonalcoholic steatohepatitis (NASH): Secondary | ICD-10-CM | POA: Diagnosis not present

## 2020-08-22 DIAGNOSIS — I10 Essential (primary) hypertension: Secondary | ICD-10-CM | POA: Diagnosis not present

## 2020-08-22 DIAGNOSIS — N3090 Cystitis, unspecified without hematuria: Secondary | ICD-10-CM | POA: Diagnosis not present

## 2020-08-22 DIAGNOSIS — K7460 Unspecified cirrhosis of liver: Secondary | ICD-10-CM | POA: Diagnosis not present

## 2020-08-22 DIAGNOSIS — E1165 Type 2 diabetes mellitus with hyperglycemia: Secondary | ICD-10-CM | POA: Diagnosis not present

## 2020-08-23 DIAGNOSIS — N3090 Cystitis, unspecified without hematuria: Secondary | ICD-10-CM | POA: Diagnosis not present

## 2020-08-23 DIAGNOSIS — R7989 Other specified abnormal findings of blood chemistry: Secondary | ICD-10-CM | POA: Diagnosis not present

## 2020-08-23 DIAGNOSIS — E1165 Type 2 diabetes mellitus with hyperglycemia: Secondary | ICD-10-CM | POA: Diagnosis not present

## 2020-08-23 DIAGNOSIS — K7290 Hepatic failure, unspecified without coma: Secondary | ICD-10-CM | POA: Diagnosis not present

## 2020-08-23 DIAGNOSIS — K7581 Nonalcoholic steatohepatitis (NASH): Secondary | ICD-10-CM | POA: Diagnosis not present

## 2020-08-23 DIAGNOSIS — I10 Essential (primary) hypertension: Secondary | ICD-10-CM | POA: Diagnosis not present

## 2020-08-23 DIAGNOSIS — K7460 Unspecified cirrhosis of liver: Secondary | ICD-10-CM | POA: Diagnosis not present

## 2020-08-27 DIAGNOSIS — I10 Essential (primary) hypertension: Secondary | ICD-10-CM | POA: Diagnosis not present

## 2020-08-27 DIAGNOSIS — E1165 Type 2 diabetes mellitus with hyperglycemia: Secondary | ICD-10-CM | POA: Diagnosis not present

## 2020-08-27 DIAGNOSIS — K7290 Hepatic failure, unspecified without coma: Secondary | ICD-10-CM | POA: Diagnosis not present

## 2020-08-27 DIAGNOSIS — N3090 Cystitis, unspecified without hematuria: Secondary | ICD-10-CM | POA: Diagnosis not present

## 2020-08-27 DIAGNOSIS — K7460 Unspecified cirrhosis of liver: Secondary | ICD-10-CM | POA: Diagnosis not present

## 2020-08-27 DIAGNOSIS — K7581 Nonalcoholic steatohepatitis (NASH): Secondary | ICD-10-CM | POA: Diagnosis not present

## 2020-08-28 DIAGNOSIS — E782 Mixed hyperlipidemia: Secondary | ICD-10-CM | POA: Diagnosis not present

## 2020-08-28 DIAGNOSIS — Z1329 Encounter for screening for other suspected endocrine disorder: Secondary | ICD-10-CM | POA: Diagnosis not present

## 2020-08-28 DIAGNOSIS — Z13220 Encounter for screening for lipoid disorders: Secondary | ICD-10-CM | POA: Diagnosis not present

## 2020-08-28 DIAGNOSIS — D649 Anemia, unspecified: Secondary | ICD-10-CM | POA: Diagnosis not present

## 2020-08-28 DIAGNOSIS — E7849 Other hyperlipidemia: Secondary | ICD-10-CM | POA: Diagnosis not present

## 2020-08-28 DIAGNOSIS — E1165 Type 2 diabetes mellitus with hyperglycemia: Secondary | ICD-10-CM | POA: Diagnosis not present

## 2020-08-28 DIAGNOSIS — K7581 Nonalcoholic steatohepatitis (NASH): Secondary | ICD-10-CM | POA: Diagnosis not present

## 2020-08-28 DIAGNOSIS — E871 Hypo-osmolality and hyponatremia: Secondary | ICD-10-CM | POA: Diagnosis not present

## 2020-08-28 DIAGNOSIS — K219 Gastro-esophageal reflux disease without esophagitis: Secondary | ICD-10-CM | POA: Diagnosis not present

## 2020-08-28 DIAGNOSIS — R7309 Other abnormal glucose: Secondary | ICD-10-CM | POA: Diagnosis not present

## 2020-08-29 DIAGNOSIS — N3090 Cystitis, unspecified without hematuria: Secondary | ICD-10-CM | POA: Diagnosis not present

## 2020-08-29 DIAGNOSIS — K7581 Nonalcoholic steatohepatitis (NASH): Secondary | ICD-10-CM | POA: Diagnosis not present

## 2020-08-29 DIAGNOSIS — K7290 Hepatic failure, unspecified without coma: Secondary | ICD-10-CM | POA: Diagnosis not present

## 2020-08-29 DIAGNOSIS — I10 Essential (primary) hypertension: Secondary | ICD-10-CM | POA: Diagnosis not present

## 2020-08-29 DIAGNOSIS — K7460 Unspecified cirrhosis of liver: Secondary | ICD-10-CM | POA: Diagnosis not present

## 2020-08-29 DIAGNOSIS — E1165 Type 2 diabetes mellitus with hyperglycemia: Secondary | ICD-10-CM | POA: Diagnosis not present

## 2020-08-30 DIAGNOSIS — K7290 Hepatic failure, unspecified without coma: Secondary | ICD-10-CM | POA: Diagnosis not present

## 2020-08-30 DIAGNOSIS — S78119A Complete traumatic amputation at level between unspecified hip and knee, initial encounter: Secondary | ICD-10-CM | POA: Diagnosis not present

## 2020-08-30 DIAGNOSIS — N3090 Cystitis, unspecified without hematuria: Secondary | ICD-10-CM | POA: Diagnosis not present

## 2020-08-30 DIAGNOSIS — F419 Anxiety disorder, unspecified: Secondary | ICD-10-CM | POA: Diagnosis not present

## 2020-08-30 DIAGNOSIS — E1165 Type 2 diabetes mellitus with hyperglycemia: Secondary | ICD-10-CM | POA: Diagnosis not present

## 2020-08-30 DIAGNOSIS — I10 Essential (primary) hypertension: Secondary | ICD-10-CM | POA: Diagnosis not present

## 2020-08-30 DIAGNOSIS — K7581 Nonalcoholic steatohepatitis (NASH): Secondary | ICD-10-CM | POA: Diagnosis not present

## 2020-08-31 DIAGNOSIS — K7290 Hepatic failure, unspecified without coma: Secondary | ICD-10-CM | POA: Diagnosis not present

## 2020-08-31 DIAGNOSIS — K7460 Unspecified cirrhosis of liver: Secondary | ICD-10-CM | POA: Diagnosis not present

## 2020-08-31 DIAGNOSIS — I10 Essential (primary) hypertension: Secondary | ICD-10-CM | POA: Diagnosis not present

## 2020-08-31 DIAGNOSIS — K7581 Nonalcoholic steatohepatitis (NASH): Secondary | ICD-10-CM | POA: Diagnosis not present

## 2020-08-31 DIAGNOSIS — N3090 Cystitis, unspecified without hematuria: Secondary | ICD-10-CM | POA: Diagnosis not present

## 2020-08-31 DIAGNOSIS — E1165 Type 2 diabetes mellitus with hyperglycemia: Secondary | ICD-10-CM | POA: Diagnosis not present

## 2020-09-01 DIAGNOSIS — Z7984 Long term (current) use of oral hypoglycemic drugs: Secondary | ICD-10-CM | POA: Diagnosis not present

## 2020-09-01 DIAGNOSIS — Z794 Long term (current) use of insulin: Secondary | ICD-10-CM | POA: Diagnosis not present

## 2020-09-01 DIAGNOSIS — R300 Dysuria: Secondary | ICD-10-CM | POA: Diagnosis not present

## 2020-09-01 DIAGNOSIS — K7581 Nonalcoholic steatohepatitis (NASH): Secondary | ICD-10-CM | POA: Diagnosis not present

## 2020-09-01 DIAGNOSIS — I10 Essential (primary) hypertension: Secondary | ICD-10-CM | POA: Diagnosis not present

## 2020-09-01 DIAGNOSIS — Z993 Dependence on wheelchair: Secondary | ICD-10-CM | POA: Diagnosis not present

## 2020-09-01 DIAGNOSIS — E1165 Type 2 diabetes mellitus with hyperglycemia: Secondary | ICD-10-CM | POA: Diagnosis not present

## 2020-09-01 DIAGNOSIS — F419 Anxiety disorder, unspecified: Secondary | ICD-10-CM | POA: Diagnosis not present

## 2020-09-01 DIAGNOSIS — Z89611 Acquired absence of right leg above knee: Secondary | ICD-10-CM | POA: Diagnosis not present

## 2020-09-01 DIAGNOSIS — K7290 Hepatic failure, unspecified without coma: Secondary | ICD-10-CM | POA: Diagnosis not present

## 2020-09-01 DIAGNOSIS — N3090 Cystitis, unspecified without hematuria: Secondary | ICD-10-CM | POA: Diagnosis not present

## 2020-09-01 DIAGNOSIS — E876 Hypokalemia: Secondary | ICD-10-CM | POA: Diagnosis not present

## 2020-09-01 DIAGNOSIS — N390 Urinary tract infection, site not specified: Secondary | ICD-10-CM | POA: Diagnosis not present

## 2020-09-01 DIAGNOSIS — E785 Hyperlipidemia, unspecified: Secondary | ICD-10-CM | POA: Diagnosis not present

## 2020-09-01 DIAGNOSIS — K7460 Unspecified cirrhosis of liver: Secondary | ICD-10-CM | POA: Diagnosis not present

## 2020-09-03 ENCOUNTER — Encounter (HOSPITAL_COMMUNITY): Payer: Self-pay | Admitting: Physical Therapy

## 2020-09-03 DIAGNOSIS — K7290 Hepatic failure, unspecified without coma: Secondary | ICD-10-CM | POA: Diagnosis not present

## 2020-09-03 DIAGNOSIS — E1165 Type 2 diabetes mellitus with hyperglycemia: Secondary | ICD-10-CM | POA: Diagnosis not present

## 2020-09-03 DIAGNOSIS — E876 Hypokalemia: Secondary | ICD-10-CM | POA: Diagnosis not present

## 2020-09-03 DIAGNOSIS — K7581 Nonalcoholic steatohepatitis (NASH): Secondary | ICD-10-CM | POA: Diagnosis not present

## 2020-09-03 DIAGNOSIS — K7460 Unspecified cirrhosis of liver: Secondary | ICD-10-CM | POA: Diagnosis not present

## 2020-09-03 DIAGNOSIS — I10 Essential (primary) hypertension: Secondary | ICD-10-CM | POA: Diagnosis not present

## 2020-09-03 DIAGNOSIS — N3090 Cystitis, unspecified without hematuria: Secondary | ICD-10-CM | POA: Diagnosis not present

## 2020-09-03 NOTE — Therapy (Signed)
[place], [place], [address] Phone: [phone]   Fax:  [phone]  Patient Details  Name: CONSUELLA SCURLOCK MRN: [id number] Date of Birth: 1946/02/10 Referring Provider:  No ref. provider found  Encounter Date: 09/03/2020  PHYSICAL THERAPY DISCHARGE SUMMARY  Visits from Start of Care: 3  Current functional level related to goals / functional outcomes: unknnown   Remaining deficits: unknown   Education / Equipment: HEP   Patient agrees to discharge. Patient goals were not met. Patient is being discharged due to not returning since the last visit. Boris Sharper, [place] CLT [phone]  09/03/2020, 11:32 AM  [place], [place], [address] Phone: [phone]   Fax:  [phone]

## 2020-09-05 DIAGNOSIS — I10 Essential (primary) hypertension: Secondary | ICD-10-CM | POA: Diagnosis not present

## 2020-09-05 DIAGNOSIS — K7290 Hepatic failure, unspecified without coma: Secondary | ICD-10-CM | POA: Diagnosis not present

## 2020-09-05 DIAGNOSIS — K7460 Unspecified cirrhosis of liver: Secondary | ICD-10-CM | POA: Diagnosis not present

## 2020-09-05 DIAGNOSIS — E1165 Type 2 diabetes mellitus with hyperglycemia: Secondary | ICD-10-CM | POA: Diagnosis not present

## 2020-09-05 DIAGNOSIS — N390 Urinary tract infection, site not specified: Secondary | ICD-10-CM | POA: Diagnosis not present

## 2020-09-05 DIAGNOSIS — N3090 Cystitis, unspecified without hematuria: Secondary | ICD-10-CM | POA: Diagnosis not present

## 2020-09-05 DIAGNOSIS — K7581 Nonalcoholic steatohepatitis (NASH): Secondary | ICD-10-CM | POA: Diagnosis not present

## 2020-09-10 DIAGNOSIS — K7581 Nonalcoholic steatohepatitis (NASH): Secondary | ICD-10-CM | POA: Diagnosis not present

## 2020-09-10 DIAGNOSIS — N3090 Cystitis, unspecified without hematuria: Secondary | ICD-10-CM | POA: Diagnosis not present

## 2020-09-10 DIAGNOSIS — I10 Essential (primary) hypertension: Secondary | ICD-10-CM | POA: Diagnosis not present

## 2020-09-10 DIAGNOSIS — K7290 Hepatic failure, unspecified without coma: Secondary | ICD-10-CM | POA: Diagnosis not present

## 2020-09-10 DIAGNOSIS — K7460 Unspecified cirrhosis of liver: Secondary | ICD-10-CM | POA: Diagnosis not present

## 2020-09-10 DIAGNOSIS — E1165 Type 2 diabetes mellitus with hyperglycemia: Secondary | ICD-10-CM | POA: Diagnosis not present

## 2020-09-11 DIAGNOSIS — E1165 Type 2 diabetes mellitus with hyperglycemia: Secondary | ICD-10-CM | POA: Diagnosis not present

## 2020-09-11 DIAGNOSIS — K7290 Hepatic failure, unspecified without coma: Secondary | ICD-10-CM | POA: Diagnosis not present

## 2020-09-11 DIAGNOSIS — N3090 Cystitis, unspecified without hematuria: Secondary | ICD-10-CM | POA: Diagnosis not present

## 2020-09-11 DIAGNOSIS — K7460 Unspecified cirrhosis of liver: Secondary | ICD-10-CM | POA: Diagnosis not present

## 2020-09-11 DIAGNOSIS — K7581 Nonalcoholic steatohepatitis (NASH): Secondary | ICD-10-CM | POA: Diagnosis not present

## 2020-09-11 DIAGNOSIS — I10 Essential (primary) hypertension: Secondary | ICD-10-CM | POA: Diagnosis not present

## 2020-09-17 DIAGNOSIS — K7460 Unspecified cirrhosis of liver: Secondary | ICD-10-CM | POA: Diagnosis not present

## 2020-09-17 DIAGNOSIS — M25562 Pain in left knee: Secondary | ICD-10-CM | POA: Diagnosis not present

## 2020-09-17 DIAGNOSIS — I10 Essential (primary) hypertension: Secondary | ICD-10-CM | POA: Diagnosis not present

## 2020-09-17 DIAGNOSIS — E1165 Type 2 diabetes mellitus with hyperglycemia: Secondary | ICD-10-CM | POA: Diagnosis not present

## 2020-09-17 DIAGNOSIS — N3090 Cystitis, unspecified without hematuria: Secondary | ICD-10-CM | POA: Diagnosis not present

## 2020-09-17 DIAGNOSIS — K7290 Hepatic failure, unspecified without coma: Secondary | ICD-10-CM | POA: Diagnosis not present

## 2020-09-17 DIAGNOSIS — K7581 Nonalcoholic steatohepatitis (NASH): Secondary | ICD-10-CM | POA: Diagnosis not present

## 2020-09-25 DIAGNOSIS — M1712 Unilateral primary osteoarthritis, left knee: Secondary | ICD-10-CM | POA: Diagnosis not present

## 2020-09-25 DIAGNOSIS — Z89611 Acquired absence of right leg above knee: Secondary | ICD-10-CM | POA: Diagnosis not present

## 2020-09-27 DIAGNOSIS — I10 Essential (primary) hypertension: Secondary | ICD-10-CM | POA: Diagnosis not present

## 2020-09-27 DIAGNOSIS — K7290 Hepatic failure, unspecified without coma: Secondary | ICD-10-CM | POA: Diagnosis not present

## 2020-09-27 DIAGNOSIS — K7460 Unspecified cirrhosis of liver: Secondary | ICD-10-CM | POA: Diagnosis not present

## 2020-09-27 DIAGNOSIS — K7581 Nonalcoholic steatohepatitis (NASH): Secondary | ICD-10-CM | POA: Diagnosis not present

## 2020-09-27 DIAGNOSIS — E1165 Type 2 diabetes mellitus with hyperglycemia: Secondary | ICD-10-CM | POA: Diagnosis not present

## 2020-09-27 DIAGNOSIS — N3090 Cystitis, unspecified without hematuria: Secondary | ICD-10-CM | POA: Diagnosis not present

## 2020-09-30 DIAGNOSIS — K7581 Nonalcoholic steatohepatitis (NASH): Secondary | ICD-10-CM | POA: Diagnosis not present

## 2020-09-30 DIAGNOSIS — I10 Essential (primary) hypertension: Secondary | ICD-10-CM | POA: Diagnosis not present

## 2020-09-30 DIAGNOSIS — K7290 Hepatic failure, unspecified without coma: Secondary | ICD-10-CM | POA: Diagnosis not present

## 2020-09-30 DIAGNOSIS — K7460 Unspecified cirrhosis of liver: Secondary | ICD-10-CM | POA: Diagnosis not present

## 2020-09-30 DIAGNOSIS — N3090 Cystitis, unspecified without hematuria: Secondary | ICD-10-CM | POA: Diagnosis not present

## 2020-09-30 DIAGNOSIS — E1165 Type 2 diabetes mellitus with hyperglycemia: Secondary | ICD-10-CM | POA: Diagnosis not present

## 2020-10-01 DIAGNOSIS — N3000 Acute cystitis without hematuria: Secondary | ICD-10-CM | POA: Diagnosis not present

## 2020-10-01 DIAGNOSIS — Z7984 Long term (current) use of oral hypoglycemic drugs: Secondary | ICD-10-CM | POA: Diagnosis not present

## 2020-10-01 DIAGNOSIS — K7581 Nonalcoholic steatohepatitis (NASH): Secondary | ICD-10-CM | POA: Diagnosis not present

## 2020-10-01 DIAGNOSIS — E785 Hyperlipidemia, unspecified: Secondary | ICD-10-CM | POA: Diagnosis not present

## 2020-10-01 DIAGNOSIS — Z794 Long term (current) use of insulin: Secondary | ICD-10-CM | POA: Diagnosis not present

## 2020-10-01 DIAGNOSIS — Z89611 Acquired absence of right leg above knee: Secondary | ICD-10-CM | POA: Diagnosis not present

## 2020-10-01 DIAGNOSIS — E1165 Type 2 diabetes mellitus with hyperglycemia: Secondary | ICD-10-CM | POA: Diagnosis not present

## 2020-10-01 DIAGNOSIS — Z9181 History of falling: Secondary | ICD-10-CM | POA: Diagnosis not present

## 2020-10-01 DIAGNOSIS — I10 Essential (primary) hypertension: Secondary | ICD-10-CM | POA: Diagnosis not present

## 2020-10-01 DIAGNOSIS — Z9842 Cataract extraction status, left eye: Secondary | ICD-10-CM | POA: Diagnosis not present

## 2020-10-01 DIAGNOSIS — Z993 Dependence on wheelchair: Secondary | ICD-10-CM | POA: Diagnosis not present

## 2020-10-01 DIAGNOSIS — Z7982 Long term (current) use of aspirin: Secondary | ICD-10-CM | POA: Diagnosis not present

## 2020-10-01 DIAGNOSIS — Z9841 Cataract extraction status, right eye: Secondary | ICD-10-CM | POA: Diagnosis not present

## 2020-10-01 DIAGNOSIS — F419 Anxiety disorder, unspecified: Secondary | ICD-10-CM | POA: Diagnosis not present

## 2020-10-01 DIAGNOSIS — K7460 Unspecified cirrhosis of liver: Secondary | ICD-10-CM | POA: Diagnosis not present

## 2020-10-01 DIAGNOSIS — K7290 Hepatic failure, unspecified without coma: Secondary | ICD-10-CM | POA: Diagnosis not present

## 2020-10-04 DIAGNOSIS — K7290 Hepatic failure, unspecified without coma: Secondary | ICD-10-CM | POA: Diagnosis not present

## 2020-10-04 DIAGNOSIS — I10 Essential (primary) hypertension: Secondary | ICD-10-CM | POA: Diagnosis not present

## 2020-10-04 DIAGNOSIS — K7581 Nonalcoholic steatohepatitis (NASH): Secondary | ICD-10-CM | POA: Diagnosis not present

## 2020-10-04 DIAGNOSIS — E1165 Type 2 diabetes mellitus with hyperglycemia: Secondary | ICD-10-CM | POA: Diagnosis not present

## 2020-10-04 DIAGNOSIS — N3000 Acute cystitis without hematuria: Secondary | ICD-10-CM | POA: Diagnosis not present

## 2020-10-04 DIAGNOSIS — K7460 Unspecified cirrhosis of liver: Secondary | ICD-10-CM | POA: Diagnosis not present

## 2020-10-07 DIAGNOSIS — K7460 Unspecified cirrhosis of liver: Secondary | ICD-10-CM | POA: Diagnosis not present

## 2020-10-07 DIAGNOSIS — K7581 Nonalcoholic steatohepatitis (NASH): Secondary | ICD-10-CM | POA: Diagnosis not present

## 2020-10-07 DIAGNOSIS — N3000 Acute cystitis without hematuria: Secondary | ICD-10-CM | POA: Diagnosis not present

## 2020-10-07 DIAGNOSIS — I10 Essential (primary) hypertension: Secondary | ICD-10-CM | POA: Diagnosis not present

## 2020-10-07 DIAGNOSIS — K7290 Hepatic failure, unspecified without coma: Secondary | ICD-10-CM | POA: Diagnosis not present

## 2020-10-07 DIAGNOSIS — E1165 Type 2 diabetes mellitus with hyperglycemia: Secondary | ICD-10-CM | POA: Diagnosis not present

## 2020-10-08 IMAGING — RF DG ABDOMEN 1V
1 series · 1 of 1 positions shown · non-contrast
Comparison: CT 11/28/2018

CLINICAL DATA: Attempted be to check for rectovaginal fistula,
retained contrast from CT

EXAM:
ABDOMEN - 1 VIEW

[Series 1: cp_standard · 0.26mm/px · 1 of 1 slices shown]
[im 1/1]
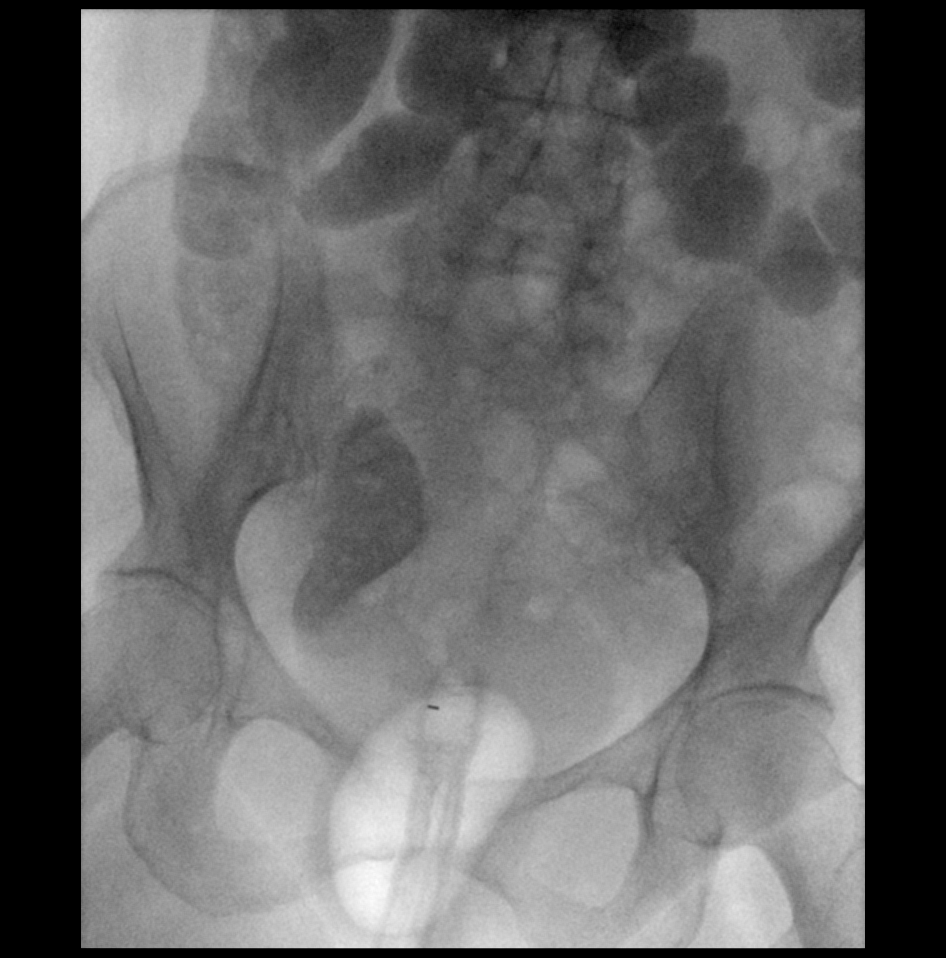

[1 of 1 positions shown; findings below may reference images not displayed]

FINDINGS: Single spot fluoroscopic view of the abdomen. Residual contrast
material is present within the colon. Gas pattern is nonobstructed.
A rectal catheter is noted. Barium enema subsequently not performed.
IMPRESSION: Residual contrast within the colon.  Nonobstructed gas pattern

## 2020-10-09 IMAGING — DX DG ABD PORTABLE 1V
1 series · 1 of 1 positions shown · non-contrast
Comparison: 11/30/2018

CLINICAL DATA: Evaluate for retained barium

EXAM:
PORTABLE ABDOMEN - 1 VIEW

[abdomen kub]
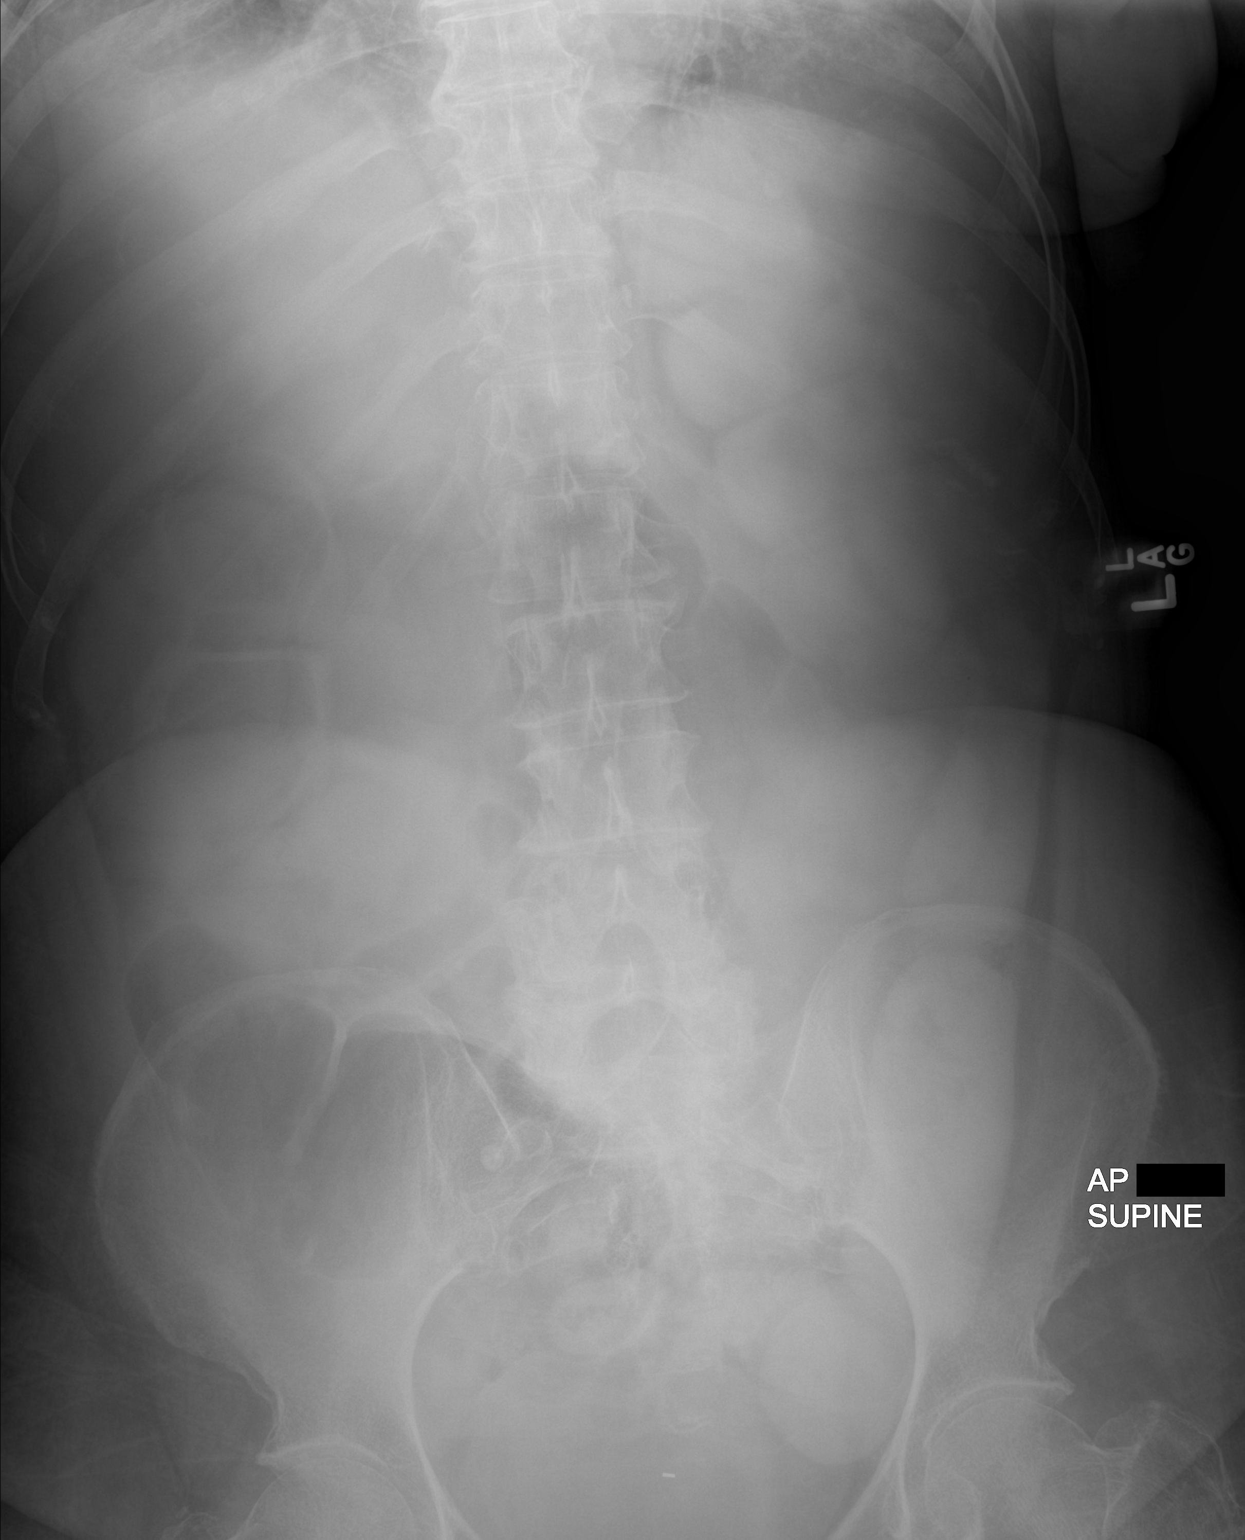

[1 of 1 positions shown; findings below may reference images not displayed]

FINDINGS: Scattered large and small bowel gas is noted. Contrast material
remains predominately within the left colon slightly migrated when
compared with the prior exam. No free air is seen. No bony
abnormality is noted.
IMPRESSION: Considerable retained colonic contrast from prior CT.

## 2020-10-10 DIAGNOSIS — K7460 Unspecified cirrhosis of liver: Secondary | ICD-10-CM | POA: Diagnosis not present

## 2020-10-10 IMAGING — DX DG ABD PORTABLE 1V
1 series · 1 of 1 positions shown · non-contrast
Comparison: 12/01/2018

CLINICAL DATA: Constipation

EXAM:
PORTABLE ABDOMEN - 1 VIEW

[abdomen kub]
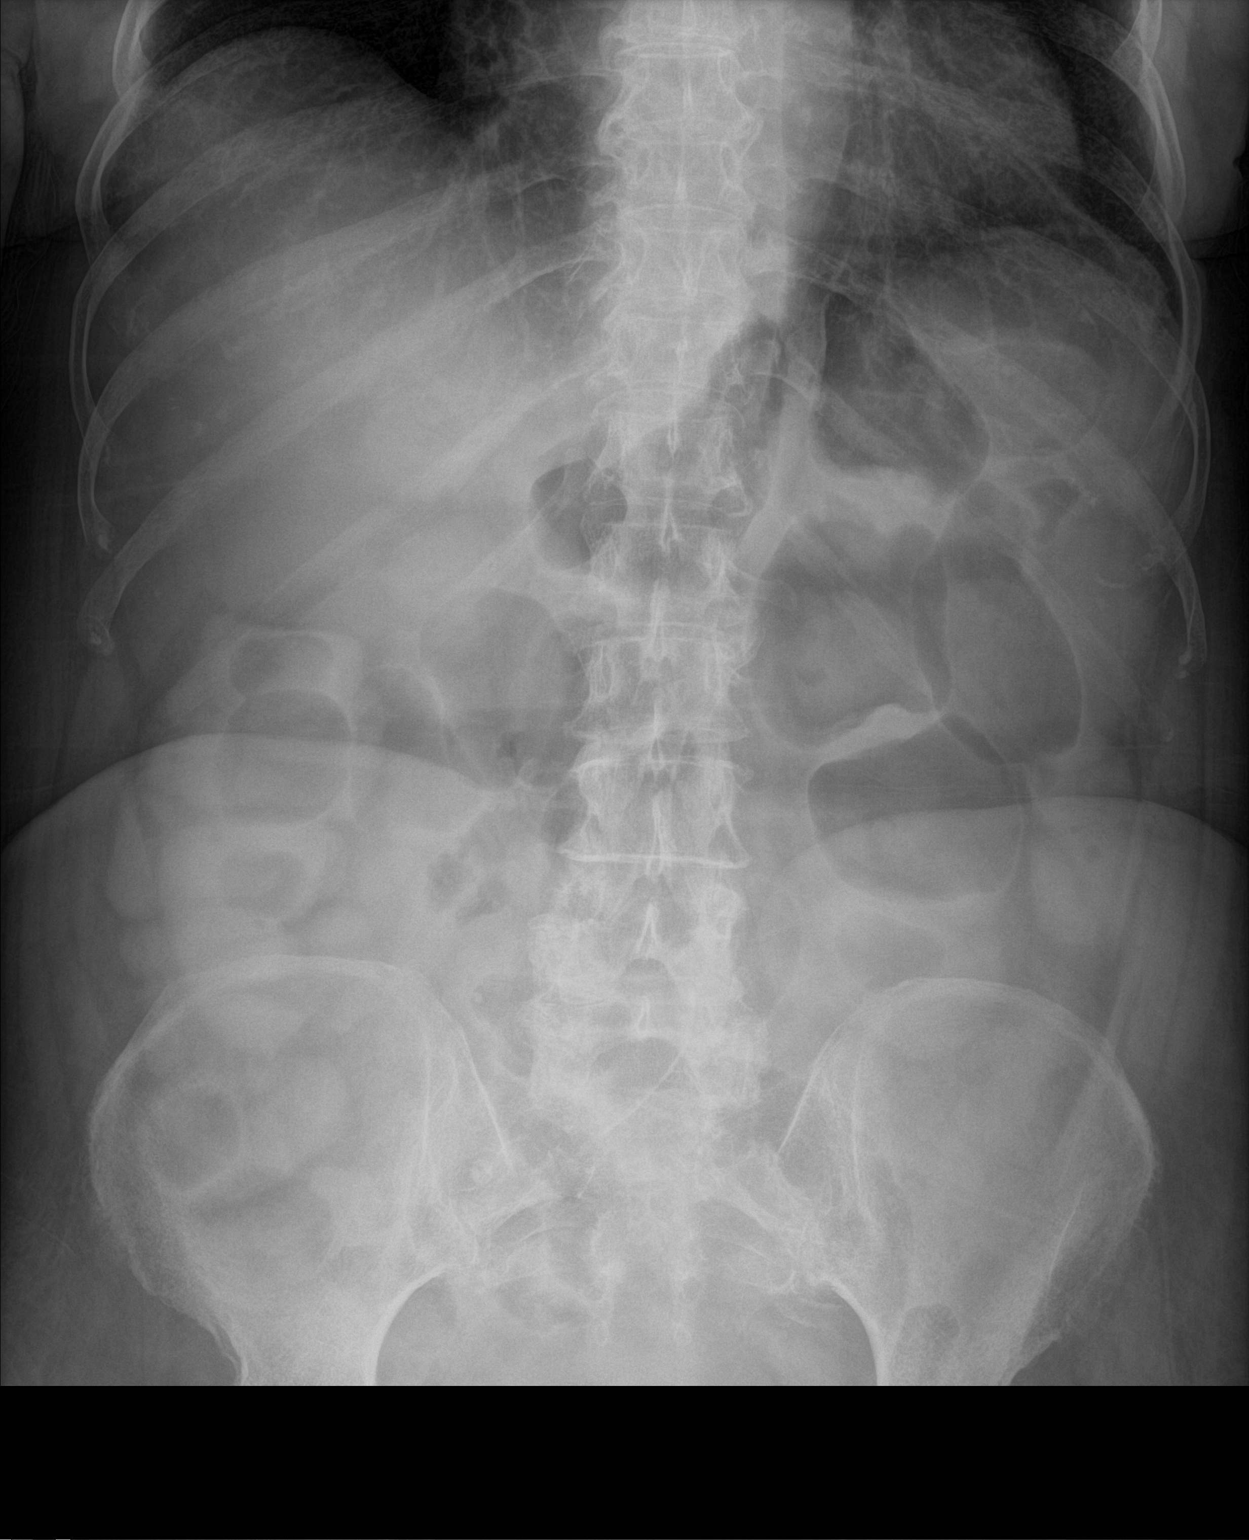

[1 of 1 positions shown; findings below may reference images not displayed]

FINDINGS: Opacity at bowel loops in the mid abdomen bilaterally greater on
RIGHT likely representing retained water-soluble contrast material
within bowel including:.

Air-filled loops of bowel in the LEFT mid abdomen.

No bowel wall thickening or definite obstruction seen.

Osseous demineralization with degenerative disc and facet disease
changes scattered throughout the thoracic and lumbar spine.

No gross urinary tract calcification identified.
IMPRESSION: Retained water-soluble contrast material within nondistended bowel
loops.

## 2020-10-11 DIAGNOSIS — K7460 Unspecified cirrhosis of liver: Secondary | ICD-10-CM | POA: Diagnosis not present

## 2020-10-11 DIAGNOSIS — K7290 Hepatic failure, unspecified without coma: Secondary | ICD-10-CM | POA: Diagnosis not present

## 2020-10-11 DIAGNOSIS — I10 Essential (primary) hypertension: Secondary | ICD-10-CM | POA: Diagnosis not present

## 2020-10-11 DIAGNOSIS — E1165 Type 2 diabetes mellitus with hyperglycemia: Secondary | ICD-10-CM | POA: Diagnosis not present

## 2020-10-11 DIAGNOSIS — N3000 Acute cystitis without hematuria: Secondary | ICD-10-CM | POA: Diagnosis not present

## 2020-10-11 DIAGNOSIS — K7581 Nonalcoholic steatohepatitis (NASH): Secondary | ICD-10-CM | POA: Diagnosis not present

## 2020-10-15 DIAGNOSIS — E1165 Type 2 diabetes mellitus with hyperglycemia: Secondary | ICD-10-CM | POA: Diagnosis not present

## 2020-10-15 DIAGNOSIS — K7290 Hepatic failure, unspecified without coma: Secondary | ICD-10-CM | POA: Diagnosis not present

## 2020-10-15 DIAGNOSIS — K7581 Nonalcoholic steatohepatitis (NASH): Secondary | ICD-10-CM | POA: Diagnosis not present

## 2020-10-15 DIAGNOSIS — K7460 Unspecified cirrhosis of liver: Secondary | ICD-10-CM | POA: Diagnosis not present

## 2020-10-15 DIAGNOSIS — I10 Essential (primary) hypertension: Secondary | ICD-10-CM | POA: Diagnosis not present

## 2020-10-15 DIAGNOSIS — N3000 Acute cystitis without hematuria: Secondary | ICD-10-CM | POA: Diagnosis not present

## 2020-10-16 ENCOUNTER — Inpatient Hospital Stay (HOSPITAL_COMMUNITY)
Admission: EM | Admit: 2020-10-16 | Discharge: 2020-10-18 | DRG: 640 | Disposition: A | Discharge status: Home Health | Payer: Medicare Other | Attending: Family Medicine | Admitting: Family Medicine

## 2020-10-16 ENCOUNTER — Other Ambulatory Visit: Payer: Self-pay

## 2020-10-16 ENCOUNTER — Encounter (HOSPITAL_COMMUNITY): Payer: Self-pay | Admitting: *Deleted

## 2020-10-16 ENCOUNTER — Emergency Department (HOSPITAL_COMMUNITY): Payer: Medicare Other

## 2020-10-16 DIAGNOSIS — E871 Hypo-osmolality and hyponatremia: Secondary | ICD-10-CM | POA: Diagnosis not present

## 2020-10-16 DIAGNOSIS — K7581 Nonalcoholic steatohepatitis (NASH): Secondary | ICD-10-CM | POA: Diagnosis present

## 2020-10-16 DIAGNOSIS — Z833 Family history of diabetes mellitus: Secondary | ICD-10-CM

## 2020-10-16 DIAGNOSIS — N3001 Acute cystitis with hematuria: Secondary | ICD-10-CM

## 2020-10-16 DIAGNOSIS — Z20822 Contact with and (suspected) exposure to covid-19: Secondary | ICD-10-CM | POA: Diagnosis present

## 2020-10-16 DIAGNOSIS — D696 Thrombocytopenia, unspecified: Secondary | ICD-10-CM

## 2020-10-16 DIAGNOSIS — Z9842 Cataract extraction status, left eye: Secondary | ICD-10-CM

## 2020-10-16 DIAGNOSIS — D6959 Other secondary thrombocytopenia: Secondary | ICD-10-CM | POA: Diagnosis present

## 2020-10-16 DIAGNOSIS — Z90710 Acquired absence of both cervix and uterus: Secondary | ICD-10-CM

## 2020-10-16 DIAGNOSIS — R269 Unspecified abnormalities of gait and mobility: Secondary | ICD-10-CM | POA: Diagnosis present

## 2020-10-16 DIAGNOSIS — D61818 Other pancytopenia: Secondary | ICD-10-CM | POA: Diagnosis present

## 2020-10-16 DIAGNOSIS — Z961 Presence of intraocular lens: Secondary | ICD-10-CM | POA: Diagnosis present

## 2020-10-16 DIAGNOSIS — I10 Essential (primary) hypertension: Secondary | ICD-10-CM | POA: Diagnosis present

## 2020-10-16 DIAGNOSIS — E119 Type 2 diabetes mellitus without complications: Secondary | ICD-10-CM | POA: Diagnosis present

## 2020-10-16 DIAGNOSIS — Z79899 Other long term (current) drug therapy: Secondary | ICD-10-CM

## 2020-10-16 DIAGNOSIS — Z8589 Personal history of malignant neoplasm of other organs and systems: Secondary | ICD-10-CM

## 2020-10-16 DIAGNOSIS — Z8249 Family history of ischemic heart disease and other diseases of the circulatory system: Secondary | ICD-10-CM | POA: Diagnosis not present

## 2020-10-16 DIAGNOSIS — Z89611 Acquired absence of right leg above knee: Secondary | ICD-10-CM

## 2020-10-16 DIAGNOSIS — Z91048 Other nonmedicinal substance allergy status: Secondary | ICD-10-CM

## 2020-10-16 DIAGNOSIS — Z794 Long term (current) use of insulin: Secondary | ICD-10-CM

## 2020-10-16 DIAGNOSIS — G9341 Metabolic encephalopathy: Secondary | ICD-10-CM | POA: Diagnosis present

## 2020-10-16 DIAGNOSIS — R8271 Bacteriuria: Secondary | ICD-10-CM | POA: Diagnosis present

## 2020-10-16 DIAGNOSIS — Z9841 Cataract extraction status, right eye: Secondary | ICD-10-CM

## 2020-10-16 DIAGNOSIS — S78111A Complete traumatic amputation at level between right hip and knee, initial encounter: Secondary | ICD-10-CM

## 2020-10-16 DIAGNOSIS — K7460 Unspecified cirrhosis of liver: Secondary | ICD-10-CM | POA: Diagnosis not present

## 2020-10-16 DIAGNOSIS — F32A Depression, unspecified: Secondary | ICD-10-CM | POA: Diagnosis present

## 2020-10-16 DIAGNOSIS — Z923 Personal history of irradiation: Secondary | ICD-10-CM

## 2020-10-16 DIAGNOSIS — Z96651 Presence of right artificial knee joint: Secondary | ICD-10-CM | POA: Diagnosis present

## 2020-10-16 DIAGNOSIS — D698 Other specified hemorrhagic conditions: Secondary | ICD-10-CM | POA: Diagnosis not present

## 2020-10-16 DIAGNOSIS — E785 Hyperlipidemia, unspecified: Secondary | ICD-10-CM | POA: Diagnosis present

## 2020-10-16 DIAGNOSIS — E1165 Type 2 diabetes mellitus with hyperglycemia: Secondary | ICD-10-CM

## 2020-10-16 DIAGNOSIS — N390 Urinary tract infection, site not specified: Secondary | ICD-10-CM | POA: Diagnosis present

## 2020-10-16 DIAGNOSIS — R410 Disorientation, unspecified: Secondary | ICD-10-CM | POA: Diagnosis not present

## 2020-10-16 DIAGNOSIS — Z886 Allergy status to analgesic agent status: Secondary | ICD-10-CM

## 2020-10-16 LAB — URINALYSIS, ROUTINE W REFLEX MICROSCOPIC
Bilirubin Urine: NEGATIVE
Glucose, UA: NEGATIVE mg/dL
Ketones, ur: NEGATIVE mg/dL
Nitrite: NEGATIVE
Protein, ur: NEGATIVE mg/dL
Specific Gravity, Urine: 1.005 (ref 1.005–1.030)
WBC, UA: >50 WBC/hpf — ABNORMAL HIGH (ref 0–5)
pH: 6 (ref 5.0–8.0)

## 2020-10-16 LAB — PROTIME-INR
INR: 1.3 — ABNORMAL HIGH (ref 0.8–1.2)
Prothrombin Time: 15.9 seconds — ABNORMAL HIGH (ref 11.4–15.2)

## 2020-10-16 LAB — COMPREHENSIVE METABOLIC PANEL
ALT: 33 U/L (ref 0–44)
AST: 49 U/L — ABNORMAL HIGH (ref 15–41)
Albumin: 3.1 g/dL — ABNORMAL LOW (ref 3.5–5.0)
Alkaline Phosphatase: 84 U/L (ref 38–126)
Anion gap: 6 (ref 5–15)
BUN: 17 mg/dL (ref 8–23)
CO2: 25 mmol/L (ref 22–32)
Calcium: 8.5 mg/dL — ABNORMAL LOW (ref 8.9–10.3)
Chloride: 92 mmol/L — ABNORMAL LOW (ref 98–111)
Creatinine, Ser: 0.98 mg/dL (ref 0.44–1.00)
GFR, Estimated: >60 mL/min (ref >60)
Glucose, Bld: 176 mg/dL — ABNORMAL HIGH (ref 70–99)
Potassium: 4.6 mmol/L (ref 3.5–5.1)
Sodium: 123 mmol/L — ABNORMAL LOW (ref 135–145)
Total Bilirubin: 1.5 mg/dL — ABNORMAL HIGH (ref 0.3–1.2)
Total Protein: 6 g/dL — ABNORMAL LOW (ref 6.5–8.1)

## 2020-10-16 LAB — POC OCCULT BLOOD, ED: Occult Blood, Feces: NEGATIVE

## 2020-10-16 LAB — CBC WITH DIFFERENTIAL/PLATELET
Abs Immature Granulocytes: 0.03 10*3/uL (ref 0.00–0.07)
Basophils Absolute: 0 10*3/uL (ref 0.0–0.1)
Basophils Relative: 1 %
Eosinophils Absolute: 0 10*3/uL (ref 0.0–0.5)
Eosinophils Relative: 0 %
HCT: 35.1 % — ABNORMAL LOW (ref 36.0–46.0)
Hemoglobin: 11.8 g/dL — ABNORMAL LOW (ref 12.0–15.0)
Immature Granulocytes: 1 %
Lymphocytes Relative: 12 %
Lymphs Abs: 0.4 10*3/uL — ABNORMAL LOW (ref 0.7–4.0)
MCH: 32.4 pg (ref 26.0–34.0)
MCHC: 33.6 g/dL (ref 30.0–36.0)
MCV: 96.4 fL (ref 80.0–100.0)
Monocytes Absolute: 0.4 10*3/uL (ref 0.1–1.0)
Monocytes Relative: 13 %
Neutro Abs: 2.5 10*3/uL (ref 1.7–7.7)
Neutrophils Relative %: 73 %
Platelets: 28 10*3/uL — CL (ref 150–400)
RBC: 3.64 MIL/uL — ABNORMAL LOW (ref 3.87–5.11)
RDW: 14.9 % (ref 11.5–15.5)
WBC: 3.5 10*3/uL — ABNORMAL LOW (ref 4.0–10.5)
nRBC: 0 % (ref 0.0–0.2)

## 2020-10-16 LAB — RESP PANEL BY RT-PCR (FLU A&B, COVID) ARPGX2
Influenza A by PCR: NEGATIVE
Influenza B by PCR: NEGATIVE
SARS Coronavirus 2 by RT PCR: NEGATIVE

## 2020-10-16 LAB — CBG MONITORING, ED
Glucose-Capillary: 110 mg/dL — ABNORMAL HIGH (ref 70–99)
Glucose-Capillary: 226 mg/dL — ABNORMAL HIGH (ref 70–99)

## 2020-10-16 LAB — TSH: TSH: 1.282 u[IU]/mL (ref 0.350–4.500)

## 2020-10-16 LAB — OSMOLALITY: Osmolality: 276 mOsm/kg (ref 275–295)

## 2020-10-16 LAB — TYPE AND SCREEN
ABO/RH(D): A POS
Antibody Screen: NEGATIVE

## 2020-10-16 LAB — OSMOLALITY, URINE: Osmolality, Ur: 264 mOsm/kg — ABNORMAL LOW (ref 300–900)

## 2020-10-16 LAB — SODIUM, URINE, RANDOM: Sodium, Ur: 91 mmol/L

## 2020-10-16 MED ORDER — SODIUM CHLORIDE 0.9 % IV SOLN: INTRAVENOUS | Status: DC

## 2020-10-16 MED ORDER — SODIUM CHLORIDE 0.9 % IV SOLN
1.0000 g | Freq: Once | INTRAVENOUS | Status: AC
  Administered 2020-10-16: 1 g via INTRAVENOUS
  Filled 2020-10-16: qty 10

## 2020-10-16 MED ORDER — BASAGLAR KWIKPEN 100 UNIT/ML ~~LOC~~ SOPN: 10.0000 [IU] | PEN_INJECTOR | SUBCUTANEOUS | Status: DC

## 2020-10-16 MED ORDER — TRAZODONE HCL 50 MG PO TABS
50.0000 mg | ORAL_TABLET | Freq: Every evening | ORAL | Status: DC | PRN
  Administered 2020-10-17: 50 mg via ORAL
  Filled 2020-10-16: qty 1

## 2020-10-16 MED ORDER — GUAIFENESIN-DM 100-10 MG/5ML PO SYRP
10.0000 mL | ORAL_SOLUTION | ORAL | Status: DC | PRN
  Administered 2020-10-17 – 2020-10-18 (×3): 10 mL via ORAL
  Filled 2020-10-16 (×3): qty 10

## 2020-10-16 MED ORDER — GABAPENTIN 300 MG PO CAPS
300.0000 mg | ORAL_CAPSULE | Freq: Two times a day (BID) | ORAL | Status: DC | PRN
  Administered 2020-10-18: 300 mg via ORAL
  Filled 2020-10-16: qty 1

## 2020-10-16 MED ORDER — ACETAMINOPHEN 325 MG PO TABS
650.0000 mg | ORAL_TABLET | Freq: Four times a day (QID) | ORAL | Status: DC | PRN
  Administered 2020-10-18: 650 mg via ORAL
  Filled 2020-10-16: qty 2

## 2020-10-16 MED ORDER — PANTOPRAZOLE SODIUM 40 MG PO TBEC
40.0000 mg | DELAYED_RELEASE_TABLET | Freq: Every day | ORAL | Status: DC
  Administered 2020-10-17 – 2020-10-18 (×2): 40 mg via ORAL
  Filled 2020-10-16 (×2): qty 1

## 2020-10-16 MED ORDER — RIFAXIMIN 550 MG PO TABS
550.0000 mg | ORAL_TABLET | Freq: Two times a day (BID) | ORAL | Status: DC
  Administered 2020-10-16 – 2020-10-18 (×4): 550 mg via ORAL
  Filled 2020-10-16 (×4): qty 1

## 2020-10-16 MED ORDER — PROMETHAZINE HCL 12.5 MG PO TABS: 12.5000 mg | ORAL_TABLET | Freq: Four times a day (QID) | ORAL | Status: DC | PRN

## 2020-10-16 MED ORDER — ACETAMINOPHEN 650 MG RE SUPP: 650.0000 mg | Freq: Four times a day (QID) | RECTAL | Status: DC | PRN

## 2020-10-16 MED ORDER — INSULIN ASPART 100 UNIT/ML IJ SOLN
0.0000 [IU] | Freq: Every day | INTRAMUSCULAR | Status: DC
  Administered 2020-10-16: 2 [IU] via SUBCUTANEOUS
  Administered 2020-10-17: 3 [IU] via SUBCUTANEOUS
  Filled 2020-10-16: qty 1

## 2020-10-16 MED ORDER — ALPRAZOLAM 0.25 MG PO TABS
0.2500 mg | ORAL_TABLET | Freq: Two times a day (BID) | ORAL | Status: DC
  Administered 2020-10-16 – 2020-10-18 (×4): 0.25 mg via ORAL
  Filled 2020-10-16 (×4): qty 1

## 2020-10-16 MED ORDER — ESCITALOPRAM OXALATE 10 MG PO TABS
20.0000 mg | ORAL_TABLET | Freq: Every morning | ORAL | Status: DC
  Administered 2020-10-17 – 2020-10-18 (×2): 20 mg via ORAL
  Filled 2020-10-16 (×2): qty 2

## 2020-10-16 MED ORDER — SPIRONOLACTONE 25 MG PO TABS
100.0000 mg | ORAL_TABLET | Freq: Every morning | ORAL | Status: DC
  Administered 2020-10-17 – 2020-10-18 (×2): 100 mg via ORAL
  Filled 2020-10-16: qty 4
  Filled 2020-10-16: qty 1

## 2020-10-16 MED ORDER — INSULIN GLARGINE-YFGN 100 UNIT/ML ~~LOC~~ SOLN
10.0000 [IU] | Freq: Every day | SUBCUTANEOUS | Status: DC
Start: 2020-10-17 — End: 2020-10-18
  Administered 2020-10-17 – 2020-10-18 (×2): 10 [IU] via SUBCUTANEOUS
  Filled 2020-10-16 (×5): qty 0.1

## 2020-10-16 MED ORDER — LORATADINE 10 MG PO TABS
10.0000 mg | ORAL_TABLET | Freq: Every morning | ORAL | Status: DC
  Administered 2020-10-17 – 2020-10-18 (×2): 10 mg via ORAL
  Filled 2020-10-16 (×2): qty 1

## 2020-10-16 MED ORDER — BUPROPION HCL ER (XL) 150 MG PO TB24
150.0000 mg | ORAL_TABLET | Freq: Every morning | ORAL | Status: DC
  Administered 2020-10-17 – 2020-10-18 (×2): 150 mg via ORAL
  Filled 2020-10-16 (×2): qty 1

## 2020-10-16 MED ORDER — ATENOLOL 25 MG PO TABS
50.0000 mg | ORAL_TABLET | Freq: Every morning | ORAL | Status: DC
  Administered 2020-10-17 – 2020-10-18 (×2): 50 mg via ORAL
  Filled 2020-10-16 (×2): qty 2

## 2020-10-16 MED ORDER — ATORVASTATIN CALCIUM 40 MG PO TABS
80.0000 mg | ORAL_TABLET | Freq: Every day | ORAL | Status: DC
  Administered 2020-10-16 – 2020-10-17 (×2): 80 mg via ORAL
  Filled 2020-10-16 (×2): qty 2

## 2020-10-16 MED ORDER — INSULIN ASPART 100 UNIT/ML IJ SOLN
0.0000 [IU] | Freq: Three times a day (TID) | INTRAMUSCULAR | Status: DC
  Administered 2020-10-17: 2 [IU] via SUBCUTANEOUS
  Administered 2020-10-17: 3 [IU] via SUBCUTANEOUS
  Administered 2020-10-18 (×2): 5 [IU] via SUBCUTANEOUS
  Administered 2020-10-18: 1 [IU] via SUBCUTANEOUS

## 2020-10-16 MED ORDER — LACTULOSE 10 GM/15ML PO SOLN
30.0000 g | Freq: Every day | ORAL | Status: DC
  Administered 2020-10-17 – 2020-10-18 (×2): 30 g via ORAL
  Filled 2020-10-16 (×2): qty 60

## 2020-10-16 MED ORDER — SODIUM CHLORIDE 0.9 % IV BOLUS
500.0000 mL | Freq: Once | INTRAVENOUS | Status: AC
  Administered 2020-10-16: 500 mL via INTRAVENOUS

## 2020-10-16 NOTE — H&P (Signed)
History and Physical    [name]:[id number] DOB: 1946/02/20 DOA: 10/16/2020  PCP: Rosalee Kaufman, PA-C   Patient coming from: Home  I have personally briefly reviewed patient's old medical records in [place]  Chief Complaint: Low platelets  HPI: Brittany Russo is a 75 y.o. female with medical history significant for liver cirrhosis with encephalopathy, diabetes mellitus, hypertension. Patient had blood work drawn yesterday, was sent to the ED with reports of low platelets.  No bleeding from any orifices, no black stools.  Daughter also says within the past 2 weeks to 4 weeks, patient completed treatment for urinary tract infection, ~ 5 course, she cannot remember the name of the antibiotic. Patient has chronic urinary frequency which is unchanged, as she is on Lasix 20 twice daily.  No dysuria, no lower abdominal pain.  No confusion which she usually has with a urinary tract infection. Patient has had poor appetite over the past few days.  No vomiting no loose stools.  ED Course: Tachycardic in the 60s, stable vitals.  Platelets 28.  Sodium 123.  UA with large leukocytes greater than 50 WBC with few bacteria.  Head CT unremarkable.  IV ceftriaxone started, 500 mill bolus normal saline given.  Hospitalist to admit for hyponatremia and thrombocytopenia.  Review of Systems: As per HPI all other systems reviewed and negative.  Past Medical History:  Diagnosis Date   Acute and subacute hepatic failure without coma    Allergy    Anasarca    Anxiety    Arthritis    Ascites 09/2018   no SBP on 850 cc tap 10/13/18   Asthma    allery induced   Cirrhosis of liver ([place]) ~ 2004   from NAFLD.  Dx ~ 2004.  Dr Dorcas Mcmurray at [facility]/     Depression    Diabetes mellitus without complication [facility])    type 2   Eczema of both hands    Endometrial cancer ([place]) 1996, 2003   hysterectomy 1996.  recurrence 2003, treated with radiation.     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 06/2007, DR Allyson Sabal Mir at [place] in [place]   Hyperlipidemia    Hypertension    Muscle weakness    Seizures ([place])    last seizure June 07, 2014    Thrombocytopenia [facility])    Thyroid nodule     Past Surgical History:  Procedure Laterality Date   AMPUTATION Right 06/29/2019   Procedure: AMPUTATION ABOVE KNEE through knee;  Surgeon: Paralee Cancel, MD;  Location: [facility];  Service: Orthopedics;  Laterality: Right;  2 hrs   CATARACT EXTRACTION W/PHACO Left 06/04/2017   Procedure: CATARACT EXTRACTION PHACO AND INTRAOCULAR LENS PLACEMENT (IOC);  Surgeon: Baruch Goldmann, MD;  Location: [facility];  Service: Ophthalmology;  Laterality: Left;  CDE: 3.90   CATARACT EXTRACTION W/PHACO Right 07/15/2017   Procedure: CATARACT EXTRACTION PHACO AND INTRAOCULAR LENS PLACEMENT (IOC);  Surgeon: Baruch Goldmann, MD;  Location: [facility];  Service: Ophthalmology;  Laterality: Right;  CDE: 7.60   COLONOSCOPY  06/2007   in [place]. for hematochzia.  radiation proctitis   DILATION AND CURETTAGE OF UTERUS     ESOPHAGOGASTRODUODENOSCOPY  2016   ESOPHAGOGASTRODUODENOSCOPY (EGD) WITH PROPOFOL N/A 12/30/2018   Procedure: EGD;  Surgeon: Clarene Essex, MD;  [facility]: [facility];  Service: Endoscopy;  Laterality: N/A;   EXCISIONAL TOTAL KNEE ARTHROPLASTY Right 01/03/2019   Procedure:  EXCISIONAL TOTAL KNEE ARTHROPLASTY;  Surgeon: Paralee Cancel, MD;  [facility]: [facility];  Service: Orthopedics;  Laterality: Right;   FLEXIBLE SIGMOIDOSCOPY N/A 12/30/2018   Procedure: FLEXIBLE SIGMOIDOSCOPY;  Surgeon: Clarene Essex, MD;  [facility];  Service: Endoscopy;  Laterality: N/A;   IR PARACENTESIS  10/13/2018   IR PARACENTESIS  07/21/2019   IR THORACENTESIS ASP PLEURAL SPACE W/IMG GUIDE  07/19/2019   STERIOD INJECTION Left 06/29/2019   Procedure: LEFT KNEE INJECTION;  Surgeon: Paralee Cancel, MD;  [facility]: [facility];  Service: Orthopedics;  Laterality: Left;   TOTAL ABDOMINAL  HYSTERECTOMY  1996   TOTAL KNEE ARTHROPLASTY Right 09/22/2018   Procedure: TOTAL KNEE ARTHROPLASTY;  Surgeon: Paralee Cancel, MD;  [facility]: [facility];  Service: Orthopedics;  Laterality: Right;  70 mins   TOTAL KNEE REVISION Right 09/27/2018   Procedure: TOTAL KNEE REVISION;  Surgeon: Paralee Cancel, MD;  Location: [facility];  Service: Orthopedics;  Laterality: Right;   TOTAL KNEE REVISION Right 11/22/2018   Procedure: repair right knee extensor mechanism;  Surgeon: Paralee Cancel, MD;  Location: [facility];  Service: Orthopedics;  Laterality: Right;  90 mins     reports that she has never smoked. She has never used smokeless tobacco. She reports that she does not drink alcohol and does not use drugs.  Allergies  Allergen Reactions   Asa [Aspirin] Other (See Comments)    Pt not allergic but cannot take due to bleeding    Other Other (See Comments)    Any jewelry metal-hives and itching     Family History  Problem Relation Age of Onset   Heart disease Mother    Diabetes Mother    Heart disease Father    Cancer Father    Cancer Maternal Grandmother    Heart disease Maternal Grandfather     Prior to Admission medications   Medication Sig Start Date End Date Taking? Authorizing Provider  acetaminophen (TYLENOL) 325 MG tablet Take 1-2 tablets (325-650 mg total) by mouth every 4 (four) hours as needed for mild pain. 07/13/19  Yes Love, Ivan Anchors, PA-C  ALPRAZolam (XANAX) 0.25 MG tablet Take 1 tablet (0.25 mg total) by mouth 2 (two) times daily. 01/06/19  Yes Mariel Aloe, MD  atenolol (TENORMIN) 25 MG tablet Take 2 tablets (50 mg total) by mouth every morning. 09/08/19  Yes Tat, Shanon Brow, MD  atorvastatin (LIPITOR) 80 MG tablet Take 80 mg by mouth daily at 8 pm.   Yes [provider]  buPROPion (WELLBUTRIN XL) 150 MG 24 hr tablet Take 150 mg by mouth in the morning.    Yes [provider]  ENULOSE 10 GM/15ML SOLN Take 30 g by mouth daily.  12/26/19  Yes [provider]   escitalopram (LEXAPRO) 20 MG tablet Take 20 mg by mouth in the morning.  05/17/19  Yes [provider]  ferrous sulfate (FERROUSUL) 325 (65 FE) MG tablet Take 1 tablet (325 mg total) by mouth 3 (three) times daily with meals. Patient taking differently: Take 325 mg by mouth 2 (two) times daily with a meal. 07/27/19 10/16/20 Yes Love, Ivan Anchors, PA-C  fluticasone (FLONASE) 50 MCG/ACT nasal spray Place 1 spray into both nostrils in the morning.   Yes [provider]  furosemide (LASIX) 20 MG tablet Take 1 tablet (20 mg total) by mouth 2 (two) times daily. 07/27/19  Yes Love, Ivan Anchors, PA-C  gabapentin (NEURONTIN) 300 MG capsule Take 1 capsule (300 mg total) by mouth 2 (two) times daily.  Patient taking differently: Take 300 mg by mouth 2 (two) times daily as needed (for nerve pain). 07/27/19  Yes Love, Ivan Anchors, PA-C  insulin aspart (NOVOLOG FLEXPEN) 100 UNIT/ML FlexPen Inject 4 Units into the skin in the morning and at bedtime. Can be up to 8 units twice a day depending on blood sugar. Take mid afternoon only if bloodsugar  is over 180  Takes in evening if it is 200 or over give up to 10 units per daughter   Yes [provider]  Insulin Glargine (BASAGLAR KWIKPEN) 100 UNIT/ML Inject 25 Units into the skin See admin instructions. Inject 25 units if sugar is mid 100's.   Yes [provider]  lactulose (CHRONULAC) 10 GM/15ML solution Take 22.5 mLs (15 g total) by mouth 2 (two) times daily. Patient taking differently: Take 30 g by mouth daily. 07/27/19  Yes Love, Ivan Anchors, PA-C  loratadine (CLARITIN) 10 MG tablet Take 10 mg by mouth in the morning.    Yes [provider]  mesalamine (CANASA) 1000 MG suppository Place 1 suppository rectally at bedtime.   Yes [provider]  Multiple Vitamin (MULTIVITAMIN WITH MINERALS) TABS tablet Take 1 tablet by mouth daily. 12/06/18  Yes Babish, Rodman Key, PA-C  pantoprazole (PROTONIX) 40 MG tablet Take 1 tablet (40 mg total) by  mouth daily. 09/08/19  Yes Tat, Shanon Brow, MD  rifaximin (XIFAXAN) 550 MG TABS tablet Take 550 mg by mouth 2 (two) times daily.   Yes [provider]  sitaGLIPtin-metformin (JANUMET) 50-500 MG per tablet Take 1 tablet by mouth 2 (two) times daily with a meal.   Yes [provider]  spironolactone (ALDACTONE) 100 MG tablet Take 1 tablet (100 mg total) by mouth daily. Patient taking differently: Take 100 mg by mouth in the morning. 07/28/19  Yes Love, Ivan Anchors, PA-C  traZODone (DESYREL) 50 MG tablet Take 50 mg by mouth at bedtime as needed for sleep.  05/17/19  Yes [provider]  lip balm (CARMEX) ointment Apply topically as needed for lip care. Patient not taking: No sig reported 07/13/19   Love, Ivan Anchors, PA-C  potassium chloride (KLOR-CON) 10 MEQ tablet Take 1 tablet (10 mEq total) by mouth daily. Patient not taking: Reported on 10/16/2020 07/27/19   Love, Ivan Anchors, PA-C  traMADol (ULTRAM) 50 MG tablet Take 1 tablet (50 mg total) by mouth every 6 (six) hours as needed for moderate pain. Patient not taking: No sig reported 07/27/19   Love, Ivan Anchors, PA-C  vitamin B-12 (CYANOCOBALAMIN) 500 MCG tablet Take 1 tablet (500 mcg total) by mouth daily. Patient not taking: No sig reported 09/08/19   Tat, Shanon Brow, MD  Ibuprofen-Diphenhydramine Cit (ADVIL PM PO) Take 60 mg by mouth at bedtime.  05/21/17  [provider]    Physical Exam: Vitals:   10/16/20 1321 10/16/20 1330 10/16/20 1520 10/16/20 1600  BP: (!) 126/58 (!) 145/59 (!) 144/58 (!) 149/60  Pulse: (!) 56 (!) 52 (!) 53 (!) 56  Resp: 18  18 17   Temp:      TempSrc:      SpO2: 100% 96% 100% 99%    Constitutional: NAD, calm, comfortable Vitals:   10/16/20 1321 10/16/20 1330 10/16/20 1520 10/16/20 1600  BP: (!) 126/58 (!) 145/59 (!) 144/58 (!) 149/60  Pulse: (!) 56 (!) 52 (!) 53 (!) 56  Resp: 18  18 17   Temp:      TempSrc:      SpO2: 100% 96% 100% 99%  Eyes: PERRL, lids and conjunctivae normal ENMT: Mucous  membranes are moist.  Neck: normal, supple, no masses, no thyromegaly Respiratory: clear to auscultation bilaterally, no wheezing, no crackles. Normal respiratory effort. No accessory muscle use.  Cardiovascular: Regular rate and rhythm, no murmurs / rubs / gallops.  Right amputation, no swelling on left.  2+ pedal pulses.  Abdomen: no tenderness, no masses palpated. No hepatosplenomegaly. Bowel sounds positive.  Musculoskeletal: no clubbing / cyanosis. No joint deformity upper and lower extremities. Good ROM, no contractures. Normal muscle tone.  Right AKA. Skin: Bruising/ecchymosis to upper extremities from multiple fingersticks for blood draws from home health, otherwise no lesions, ulcers. No induration Neurologic: No apparent cranial normality, moving extremities spontaneously. Psychiatric: Normal judgment and insight. Alert and oriented x 3. Normal mood.   Labs on Admission: I have personally reviewed following labs and imaging studies  CBC: Recent Labs  Lab 10/16/20 1238  WBC 3.5*  NEUTROABS 2.5  HGB 11.8*  HCT 35.1*  MCV 96.4  PLT 28*   Basic Metabolic Panel: Recent Labs  Lab 10/16/20 1238  NA 123*  K 4.6  CL 92*  CO2 25  GLUCOSE 176*  BUN 17  CREATININE 0.98  CALCIUM 8.5*   Liver Function Tests: Recent Labs  Lab 10/16/20 1238  AST 49*  ALT 33  ALKPHOS 84  BILITOT 1.5*  PROT 6.0*  ALBUMIN 3.1*   Urine analysis:    Component Value Date/Time   COLORURINE YELLOW 10/16/2020 1532   APPEARANCEUR HAZY (A) 10/16/2020 1532   LABSPEC 1.005 10/16/2020 1532   PHURINE 6.0 10/16/2020 1532   GLUCOSEU NEGATIVE 10/16/2020 1532   HGBUR MODERATE (A) 10/16/2020 1532   BILIRUBINUR NEGATIVE 10/16/2020 [place] 03/06/2013 1100   KETONESUR NEGATIVE 10/16/2020 1532   PROTEINUR NEGATIVE 10/16/2020 1532   UROBILINOGEN negative 03/06/2013 1100   NITRITE NEGATIVE 10/16/2020 1532   LEUKOCYTESUR LARGE (A) 10/16/2020 1532    Radiological Exams on  Admission: CT Head Wo Contrast  Result Date: 10/16/2020 CLINICAL DATA:  Delirium confusion EXAM: CT HEAD WITHOUT CONTRAST TECHNIQUE: Contiguous axial images were obtained from the base of the skull through the vertex without intravenous contrast. COMPARISON:  None. FINDINGS: Brain: No evidence of acute infarction, hemorrhage, extra-axial collection, ventriculomegaly, or mass effect. Generalized cerebral atrophy. Periventricular white matter low attenuation likely secondary to microangiopathy. Vascular: Cerebrovascular atherosclerotic calcifications are noted. Skull: Negative for fracture or focal lesion. Sinuses/Orbits: Visualized portions of the orbits are unremarkable. Visualized portions of the paranasal sinuses are unremarkable. Visualized portions of the mastoid air cells are unremarkable. Other: None. IMPRESSION: No acute intracranial pathology. Electronically Signed   By: Kathreen Devoid   On: 10/16/2020 15:28    EKG: Independently reviewed.  Sinus rhythm rate 57, QTC prolonged 528.  No significant ST-T wave changes compared to prior.  Assessment/Plan Principal Problem:   Thrombocytopenia ([place]) Active Problems:   Hyponatremia   DM2 (diabetes mellitus, type 2) ([facility])   Depression   Cirrhosis ([place])   UTI (urinary tract infection)  Thrombocytopenia-platelets down to 28, last checked 02/2020- 42, baseline 40s to 72.  No evidence of bleeding at this time.  Hgb stable.  Thrombocytopenia likely due to liver cirrhosis. Not on antiplatelets or anticoagulation. -Check PT/INR -Trend platelets -At this time does not meet criteria for transfusion  Hyponatremia-123, baseline low to mid 130s.  Likely from poor oral intake, and Lasix 20 twice daily. -500 mill bolus normal saline given, continue normal saline 75 cc/h x 20hrs -Follow-up urine sodium,  urine osmolality -Serum osmolality -TSH -Hold Lasix for now  Asymptomatic bacteriuria -recently completed course of antibiotics about 2 to 4 weeks ago.   Unknown antibiotic name.  At this time she has no symptoms referable to urinary tract infection.  She has chronic and unchanged urinary frequency likely from Lasix.  Afebrile without leukocytosis, she is well-appearing.   -Hold off on further antibiotics at this time -Follow-up urine cultures  Prolonged QTC-528, not new.  Home meds include Lexapro, Wellbutrin.  Hypertension-stable. -Resume home beta-blockers, spironolactone  Diabetes-random glucose 176. - HgbA1c - SSI- S -Lantus -Hold Janumet  NASH Liver cirrhosis history of encephalopathy-mental status is at baseline.  On once daily lactulose, has not taken lactulose in about 2 days, as sometimes she has explosive diarrhea from it.  Compliant with rifaximin. -Resume lactulose, rifaximin - Resume spironolactone   -Hold Lasix  Right  AKA  DVT prophylaxis: SCDs Code Status: Full code Family Communication: Daughter at bedside. Disposition Plan:   ~ 2 days Consults called: None Admission status: Obs ,tele   Bethena Roys MD [facility]  10/16/2020, 6:58 PM

## 2020-10-16 NOTE — ED Notes (Signed)
Pt asked for and received toothbrush and toothpaste

## 2020-10-16 NOTE — ED Provider Notes (Signed)
[facility] EMERGENCY DEPARTMENT Provider Note   CSN: [id number] Arrival date & time: 10/16/20  1213     History Chief Complaint  Patient presents with   Abnormal Lab    Brittany Russo is a 75 y.o. female with past medical history significant for cirrhosis, anasarca, asthma, diabetes, hypertension, chronic weakness, thrombocytopenia, chronic cough who presents for evaluation of abnormal labs.  She is followed by Dr. Saunders Glance.  Apparently was getting her "normal" labs drawn via home health.  Was called yesterday and told that her platelets were 29.  Chronic bruising which is at base.  Unfortunately has additional bruises to her bilateral upper extremities due to home health having to try multiple times to get blood from patient.  Patient denies any gingival bleeding, epistaxis, hematuria, bloody stools, headache, chest pain, shortness of breath, abdominal pain.  Denies additional aggravating relieving factors.  Collateral obtained from patient daughter, Eliott Nine patient has needed transfusions previously.  Patient is on chronic lactulose.  As such we will have some intermittent breakdown of the skin on her bottom due to chronic diarrhea.  She is at her baseline mentation.  Family has not noted any blood in the stool.  She has chronic UTI, last treated 2 weeks ago.  No urinary symptoms currently.  She is on medication for a chronic cough.  Has some confusion at baseline.  Does have known poor dentition and needs her teeth removed.  Apparently was post to be removed yesterday however unable to do this due to low platelets. Patient has not been eating well. Taking all meds at home.  History obtained from patient, family and past medical records.  No interpreter used.  GI- [facility]. Dr. Therisa Doyne  HPI     Past Medical History:  Diagnosis Date   Acute and subacute hepatic failure without coma    Allergy    Anasarca    Anxiety    Arthritis    Ascites 09/2018   no SBP on 850 cc tap  10/13/18   Asthma    allery induced   Cirrhosis of liver ([place]) ~ 2004   from NAFLD.  Dx ~ 2004.  Dr Dorcas Mcmurray at [facility]/     Depression    Diabetes mellitus without complication [facility])    type 2   Eczema of both hands    Endometrial cancer ([place]) 1996, 2003   hysterectomy 1996.  recurrence 2003, treated with radiation.     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, DR Allyson Sabal Mir at [facility] in [place]   Hyperlipidemia    Hypertension    Muscle weakness    Seizures ([place])    last seizure June 07, 2014    Thrombocytopenia [facility])    Thyroid nodule     Patient Active Problem List   Diagnosis Date Noted   SBO (small bowel obstruction) ([place]) 01/08/2020   Hyperammonemia ([place]) 09/07/2019   Transaminitis 09/07/2019   Lactic acidosis 09/07/2019   Elevated INR 09/07/2019   Abdominal pain 09/07/2019   Radiation proctitis 07/28/2019   Status post thoracentesis    Acute on chronic anemia    Pleural effusion    Supratherapeutic INR    Urinary retention    Radiation cystitis    Labile blood pressure    Labile blood glucose    Anxiety state    Controlled type 2 diabetes mellitus with hyperglycemia, with long-term current use of insulin ([place])  Hyponatremia    Thrombocytopenia (HCC)    Phantom limb pain (HCC)    Postoperative pain    Amputation above knee ([place]) 07/04/2019   S/P right knee disarticulation amputation 06/29/2019   Above knee amputation of right lower extremity ([place]) 06/29/2019   Hyperglycemia    Failed total right knee replacement ([place])    Hepatic encephalopathy ([place]) 12/28/2018   UTI (urinary tract infection) 12/28/2018   Bradycardia 12/28/2018   Hyperkalemia 12/28/2018   Overweight (BMI 25.0-29.9) 11/23/2018   Mechanical problem with extremity 11/22/2018   Cirrhosis of liver with ascites ([place]) 11/09/2018   Nausea without vomiting 10/26/2018   Ascites    Rectal bleeding    AKI (acute kidney  injury) ([place]) 10/11/2018   Lower GI bleed 10/10/2018   Anasarca 10/10/2018   Hypoalbuminemia 10/10/2018   Depression 09/28/2018   Acute hepatic encephalopathy 09/28/2018   Cirrhosis ([place]) 09/28/2018   Periprosthetic fracture around internal prosthetic right knee joint 09/24/2018   S/P right TKA 09/22/2018   Status post total knee replacement, right 09/22/2018   DM2 (diabetes mellitus, type 2) ([place]) 07/11/2015   Hypertension 07/11/2015   Hyperlipidemia 07/11/2015    Past Surgical History:  Procedure Laterality Date   AMPUTATION Right 06/29/2019   Procedure: AMPUTATION ABOVE KNEE through knee;  Surgeon: Paralee Cancel, MD;  Location: [facility];  Service: Orthopedics;  Laterality: Right;  2 hrs   CATARACT EXTRACTION W/PHACO Left 06/04/2017   Procedure: CATARACT EXTRACTION PHACO AND INTRAOCULAR LENS PLACEMENT (IOC);  Surgeon: Baruch Goldmann, MD;  Location: [facility];  Service: Ophthalmology;  Laterality: Left;  CDE: 3.90   CATARACT EXTRACTION W/PHACO Right 07/15/2017   Procedure: CATARACT EXTRACTION PHACO AND INTRAOCULAR LENS PLACEMENT (IOC);  Surgeon: Baruch Goldmann, MD;  Location: [facility];  Service: Ophthalmology;  Laterality: Right;  CDE: 7.60   COLONOSCOPY  06/2007   in [place]. for hematochzia.  radiation proctitis   DILATION AND CURETTAGE OF UTERUS     ESOPHAGOGASTRODUODENOSCOPY  2016   ESOPHAGOGASTRODUODENOSCOPY (EGD) WITH PROPOFOL N/A 12/30/2018   Procedure: EGD;  Surgeon: Clarene Essex, MD;  Location: [facility];  Service: Endoscopy;  Laterality: N/A;   EXCISIONAL TOTAL KNEE ARTHROPLASTY Right 01/03/2019   Procedure: EXCISIONAL TOTAL KNEE ARTHROPLASTY;  Surgeon: Paralee Cancel, MD;  Location: [facility];  Service: Orthopedics;  Laterality: Right;   FLEXIBLE SIGMOIDOSCOPY N/A 12/30/2018   Procedure: FLEXIBLE SIGMOIDOSCOPY;  Surgeon: Clarene Essex, MD;  Location: [facility];  Service: Endoscopy;  Laterality: N/A;   IR PARACENTESIS  10/13/2018   IR PARACENTESIS  07/21/2019   IR THORACENTESIS ASP PLEURAL  SPACE W/IMG GUIDE  07/19/2019   STERIOD INJECTION Left 06/29/2019   Procedure: LEFT KNEE INJECTION;  Surgeon: Paralee Cancel, MD;  Location: [facility];  Service: Orthopedics;  Laterality: Left;   TOTAL ABDOMINAL HYSTERECTOMY  1996   TOTAL KNEE ARTHROPLASTY Right 09/22/2018   Procedure: TOTAL KNEE ARTHROPLASTY;  Surgeon: Paralee Cancel, MD;  Location: [facility];  Service: Orthopedics;  Laterality: Right;  70 mins   TOTAL KNEE REVISION Right 09/27/2018   Procedure: TOTAL KNEE REVISION;  Surgeon: Paralee Cancel, MD;  Location: [facility];  Service: Orthopedics;  Laterality: Right;   TOTAL KNEE REVISION Right 11/22/2018   Procedure: repair right knee extensor mechanism;  Surgeon: Paralee Cancel, MD;  Location: [facility];  Service: Orthopedics;  Laterality: Right;  90 mins     OB History   No obstetric history on file.     Family History  Problem Relation Age of Onset   Heart disease  Mother    Diabetes Mother    Heart disease Father    Cancer Father    Cancer Maternal Grandmother    Heart disease Maternal Grandfather     Social History   Tobacco Use   Smoking status: Never   Smokeless tobacco: Never  Vaping Use   Vaping Use: Never used  Substance Use Topics   Alcohol use: No   Drug use: No    Home Medications Prior to Admission medications   Medication Sig Start Date End Date Taking? Authorizing Provider  acetaminophen (TYLENOL) 325 MG tablet Take 1-2 tablets (325-650 mg total) by mouth every 4 (four) hours as needed for mild pain. 07/13/19  Yes Love, Ivan Anchors, PA-C  ALPRAZolam (XANAX) 0.25 MG tablet Take 1 tablet (0.25 mg total) by mouth 2 (two) times daily. 01/06/19  Yes Mariel Aloe, MD  atenolol (TENORMIN) 25 MG tablet Take 2 tablets (50 mg total) by mouth every morning. 09/08/19  Yes Tat, Shanon Brow, MD  atorvastatin (LIPITOR) 80 MG tablet Take 80 mg by mouth daily at 8 pm.   Yes [provider]  buPROPion (WELLBUTRIN XL) 150 MG 24 hr tablet Take 150 mg by mouth in the morning.    Yes  [provider]  ENULOSE 10 GM/15ML SOLN Take 30 g by mouth daily.  12/26/19  Yes [provider]  escitalopram (LEXAPRO) 20 MG tablet Take 20 mg by mouth in the morning.  05/17/19  Yes [provider]  ferrous sulfate (FERROUSUL) 325 (65 FE) MG tablet Take 1 tablet (325 mg total) by mouth 3 (three) times daily with meals. Patient taking differently: Take 325 mg by mouth 2 (two) times daily with a meal. 07/27/19 10/16/20 Yes Love, Ivan Anchors, PA-C  fluticasone (FLONASE) 50 MCG/ACT nasal spray Place 1 spray into both nostrils in the morning.   Yes [provider]  furosemide (LASIX) 20 MG tablet Take 1 tablet (20 mg total) by mouth 2 (two) times daily. 07/27/19  Yes Love, Ivan Anchors, PA-C  gabapentin (NEURONTIN) 300 MG capsule Take 1 capsule (300 mg total) by mouth 2 (two) times daily. Patient taking differently: Take 300 mg by mouth 2 (two) times daily as needed (for nerve pain). 07/27/19  Yes Love, Ivan Anchors, PA-C  insulin aspart (NOVOLOG FLEXPEN) 100 UNIT/ML FlexPen Inject 4 Units into the skin in the morning and at bedtime. Can be up to 8 units twice a day depending on blood sugar. Take mid afternoon only if bloodsugar  is over 180  Takes in evening if it is 200 or over give up to 10 units per daughter   Yes [provider]  Insulin Glargine (BASAGLAR KWIKPEN) 100 UNIT/ML Inject 25 Units into the skin See admin instructions. Inject 25 units if sugar is mid 100's.   Yes [provider]  lactulose (CHRONULAC) 10 GM/15ML solution Take 22.5 mLs (15 g total) by mouth 2 (two) times daily. Patient taking differently: Take 30 g by mouth daily. 07/27/19  Yes Love, Ivan Anchors, PA-C  loratadine (CLARITIN) 10 MG tablet Take 10 mg by mouth in the morning.    Yes [provider]  mesalamine (CANASA) 1000 MG suppository Place 1 suppository rectally at bedtime.   Yes [provider]  Multiple Vitamin (MULTIVITAMIN WITH MINERALS) TABS tablet Take 1 tablet by  mouth daily. 12/06/18  Yes Babish, Rodman Key, PA-C  pantoprazole (PROTONIX) 40 MG tablet Take 1 tablet (40 mg total) by mouth daily. 09/08/19  Yes Orson Eva, MD  rifaximin (XIFAXAN) 550 MG TABS tablet Take 550 mg by mouth 2 (two) times daily.   Yes [provider]  sitaGLIPtin-metformin (JANUMET) 50-500 MG per tablet Take 1 tablet by mouth 2 (two) times daily with a meal.   Yes [provider]  spironolactone (ALDACTONE) 100 MG tablet Take 1 tablet (100 mg total) by mouth daily. Patient taking differently: Take 100 mg by mouth in the morning. 07/28/19  Yes Love, Ivan Anchors, PA-C  traZODone (DESYREL) 50 MG tablet Take 50 mg by mouth at bedtime as needed for sleep.  05/17/19  Yes [provider]  lip balm (CARMEX) ointment Apply topically as needed for lip care. Patient not taking: No sig reported 07/13/19   Love, Ivan Anchors, PA-C  potassium chloride (KLOR-CON) 10 MEQ tablet Take 1 tablet (10 mEq total) by mouth daily. Patient not taking: Reported on 10/16/2020 07/27/19   Love, Ivan Anchors, PA-C  traMADol (ULTRAM) 50 MG tablet Take 1 tablet (50 mg total) by mouth every 6 (six) hours as needed for moderate pain. Patient not taking: No sig reported 07/27/19   Love, Ivan Anchors, PA-C  vitamin B-12 (CYANOCOBALAMIN) 500 MCG tablet Take 1 tablet (500 mcg total) by mouth daily. Patient not taking: No sig reported 09/08/19   Tat, Shanon Brow, MD  Ibuprofen-Diphenhydramine Cit (ADVIL PM PO) Take 60 mg by mouth at bedtime.  05/21/17  [provider]    Allergies    Asa [aspirin] and Other  Review of Systems   Review of Systems  Constitutional: Negative.   HENT: Negative.    Respiratory: Negative.    Cardiovascular: Negative.   Gastrointestinal: Negative.   Genitourinary: Negative.   Musculoskeletal: Negative.   Skin: Negative.   Neurological: Negative.   All other systems reviewed and are negative.  Physical Exam Updated Vital Signs BP (!) 144/58   Pulse (!) 53   Temp 98.6 F (37 C)  (Oral)   Resp 18   SpO2 100%   Physical Exam Vitals and nursing note reviewed. Exam conducted with a chaperone present.  Constitutional:      General: She is not in acute distress.    Appearance: She is well-developed. She is ill-appearing (Chronically ill appearing).  HENT:     Head: Normocephalic and atraumatic.     Nose:     Comments: No epistaxis    Mouth/Throat:     Comments: Poor dentition.  Teeth eroded to gumline upper teeth.  No gingival erythema Eyes:     Pupils: Pupils are equal, round, and reactive to light.  Cardiovascular:     Rate and Rhythm: Normal rate.     Pulses: Normal pulses.     Heart sounds: Normal heart sounds.  Pulmonary:     Effort: Pulmonary effort is normal. No respiratory distress.     Breath sounds: Normal breath sounds.  Abdominal:     General: Bowel sounds are normal. There is no distension.     Palpations: Abdomen is soft.     Tenderness: There is no abdominal tenderness. There is no guarding or rebound.  Genitourinary:    Comments: Light brown stool in vault. Occult negative Musculoskeletal:        General: Normal range of motion.     Cervical back: Normal range of motion.     Comments: No bony tenderness.  Right LE amputation at knee. Moves all 4 extremities without difficulty  Skin:    General: Skin is warm and dry.     Capillary Refill: Capillary  refill takes less than 2 seconds.     Comments: Multiple areas of bruising from prior blood draws. No large hematomas    Neurological:     General: No focal deficit present.     Mental Status: She is alert and oriented to person, place, and time. Mental status is at baseline.     Cranial Nerves: Cranial nerves are intact.     Sensory: Sensation is intact.     Motor: Motor function is intact.  Psychiatric:        Mood and Affect: Mood normal.   ED Results / Procedures / Treatments   Labs (all labs ordered are listed, but only abnormal results are displayed) Labs Reviewed  CBC WITH  DIFFERENTIAL/PLATELET - Abnormal; Notable for the following components:      Result Value   WBC 3.5 (*)    RBC 3.64 (*)    Hemoglobin 11.8 (*)    HCT 35.1 (*)    Platelets 28 (*)    Lymphs Abs 0.4 (*)    All other components within normal limits  COMPREHENSIVE METABOLIC PANEL - Abnormal; Notable for the following components:   Sodium 123 (*)    Chloride 92 (*)    Glucose, Bld 176 (*)    Calcium 8.5 (*)    Total Protein 6.0 (*)    Albumin 3.1 (*)    AST 49 (*)    Total Bilirubin 1.5 (*)    All other components within normal limits  URINALYSIS, ROUTINE W REFLEX MICROSCOPIC - Abnormal; Notable for the following components:   APPearance HAZY (*)    Hgb urine dipstick MODERATE (*)    Leukocytes,Ua LARGE (*)    WBC, UA >50 (*)    Bacteria, UA FEW (*)    All other components within normal limits  RESP PANEL BY RT-PCR (FLU A&B, COVID) ARPGX2  OSMOLALITY, URINE  SODIUM, URINE, RANDOM  POC OCCULT BLOOD, ED  TYPE AND SCREEN    EKG None  Radiology CT Head Wo Contrast  Result Date: 10/16/2020 CLINICAL DATA:  Delirium confusion EXAM: CT HEAD WITHOUT CONTRAST TECHNIQUE: Contiguous axial images were obtained from the base of the skull through the vertex without intravenous contrast. COMPARISON:  None. FINDINGS: Brain: No evidence of acute infarction, hemorrhage, extra-axial collection, ventriculomegaly, or mass effect. Generalized cerebral atrophy. Periventricular white matter low attenuation likely secondary to microangiopathy. Vascular: Cerebrovascular atherosclerotic calcifications are noted. Skull: Negative for fracture or focal lesion. Sinuses/Orbits: Visualized portions of the orbits are unremarkable. Visualized portions of the paranasal sinuses are unremarkable. Visualized portions of the mastoid air cells are unremarkable. Other: None. IMPRESSION: No acute intracranial pathology. Electronically Signed   By: Kathreen Devoid   On: 10/16/2020 15:28    Procedures .Critical  Care  Date/Time: 10/16/2020 3:52 PM Performed by: Nettie Elm, PA-C Authorized by: Nettie Elm, PA-C   Critical care provider statement:    Critical care time (minutes):  35   Critical care was necessary to treat or prevent imminent or life-threatening deterioration of the following conditions:  Metabolic crisis   Critical care was time spent personally by me on the following activities:  Discussions with consultants, evaluation of patient's response to treatment, examination of patient, ordering and performing treatments and interventions, ordering and review of laboratory studies, ordering and review of radiographic studies, pulse oximetry, re-evaluation of patient's condition, obtaining history from patient or surrogate and review of old charts   Medications Ordered in ED Medications  cefTRIAXone (ROCEPHIN) 1 g in  sodium chloride 0.9 % 100 mL IVPB (has no administration in time range)  sodium chloride 0.9 % bolus 500 mL (500 mLs Intravenous New Bag/Given 10/16/20 1524)    ED Course  I have reviewed the triage vital signs and the nursing notes.  Pertinent labs & imaging results that were available during my care of the patient were reviewed by me and considered in my medical decision making (see chart for details).  Here for evaluation of abnormal lab.  Total platelets were 29.  This was done on screening labs.  No history of cirrhosis, thrombocytopenia.  Appears at baseline patient is in low 40s for platelets.  She has no gingival, epistaxis bleeding on exam.  Denies any blood in her urine or stool.  Plan to recheck labs and reassess.  Labs and Imaging personally reviewed and interpreted:  CBC without leukopenia at 3.5, hemoglobin 11.8, platelets 28 Metabolic panel sodium 628 previous in epic 134, yesterday 127, week prior at 129, baseline 134, glucose 176, T bili 1.5 which is improved from previous CT head without acute abnormality UA positive for UTI Occult  negative  Patient with severe hyponatremia which appear to be down trending in the last 2 week. Severely low platelets however reassuring without acute bleeding at this time. Urine in room is dark and cloudy. Completed abx 2 weeks ago for UTI.  Will admit for hyponatremia, thrombocytopenia, uti.   The patient appears reasonably stabilized for admission considering the current resources, flow, and capabilities available in the ED at this time, and I doubt any other [facility] requiring further screening and/or treatment in the ED prior to admission.   CONSULT with Dr. Denton Brick with [facility] who will evaluate patient for admission.   Patient discussed with attending Dr. Langston Masker who agrees with above treatment, plan and disposition.    MDM Rules/Calculators/A&P                            Final Clinical Impression(s) / ED Diagnoses Final diagnoses:  Hyponatremia  Acute cystitis with hematuria  Thrombocytopenia [facility])    Rx / DC Orders ED Discharge Orders     None        Clinton Wahlberg A, PA-C 10/16/20 1649    Wyvonnia Dusky, MD 10/16/20 [phone]

## 2020-10-16 NOTE — ED Triage Notes (Signed)
Pt states she had routine blood draw yesterday and they called her today and told her to come to ED due to low platelet count

## 2020-10-17 ENCOUNTER — Encounter (HOSPITAL_COMMUNITY): Payer: Self-pay | Admitting: Internal Medicine

## 2020-10-17 DIAGNOSIS — R269 Unspecified abnormalities of gait and mobility: Secondary | ICD-10-CM | POA: Diagnosis present

## 2020-10-17 DIAGNOSIS — Z923 Personal history of irradiation: Secondary | ICD-10-CM | POA: Diagnosis not present

## 2020-10-17 DIAGNOSIS — Z20822 Contact with and (suspected) exposure to covid-19: Secondary | ICD-10-CM | POA: Diagnosis present

## 2020-10-17 DIAGNOSIS — Z833 Family history of diabetes mellitus: Secondary | ICD-10-CM | POA: Diagnosis not present

## 2020-10-17 DIAGNOSIS — D696 Thrombocytopenia, unspecified: Secondary | ICD-10-CM | POA: Diagnosis not present

## 2020-10-17 DIAGNOSIS — D61818 Other pancytopenia: Secondary | ICD-10-CM | POA: Diagnosis present

## 2020-10-17 DIAGNOSIS — G9341 Metabolic encephalopathy: Secondary | ICD-10-CM | POA: Diagnosis present

## 2020-10-17 DIAGNOSIS — Z89611 Acquired absence of right leg above knee: Secondary | ICD-10-CM | POA: Diagnosis not present

## 2020-10-17 DIAGNOSIS — D6959 Other secondary thrombocytopenia: Secondary | ICD-10-CM | POA: Diagnosis present

## 2020-10-17 DIAGNOSIS — Z961 Presence of intraocular lens: Secondary | ICD-10-CM | POA: Diagnosis present

## 2020-10-17 DIAGNOSIS — R8271 Bacteriuria: Secondary | ICD-10-CM | POA: Diagnosis present

## 2020-10-17 DIAGNOSIS — Z9842 Cataract extraction status, left eye: Secondary | ICD-10-CM | POA: Diagnosis not present

## 2020-10-17 DIAGNOSIS — Z9841 Cataract extraction status, right eye: Secondary | ICD-10-CM | POA: Diagnosis not present

## 2020-10-17 DIAGNOSIS — Z8249 Family history of ischemic heart disease and other diseases of the circulatory system: Secondary | ICD-10-CM | POA: Diagnosis not present

## 2020-10-17 DIAGNOSIS — K7581 Nonalcoholic steatohepatitis (NASH): Secondary | ICD-10-CM | POA: Diagnosis present

## 2020-10-17 DIAGNOSIS — F32A Depression, unspecified: Secondary | ICD-10-CM | POA: Diagnosis present

## 2020-10-17 DIAGNOSIS — Z90710 Acquired absence of both cervix and uterus: Secondary | ICD-10-CM | POA: Diagnosis not present

## 2020-10-17 DIAGNOSIS — Z96651 Presence of right artificial knee joint: Secondary | ICD-10-CM | POA: Diagnosis present

## 2020-10-17 DIAGNOSIS — E119 Type 2 diabetes mellitus without complications: Secondary | ICD-10-CM | POA: Diagnosis present

## 2020-10-17 DIAGNOSIS — I10 Essential (primary) hypertension: Secondary | ICD-10-CM | POA: Diagnosis present

## 2020-10-17 DIAGNOSIS — N3001 Acute cystitis with hematuria: Secondary | ICD-10-CM | POA: Diagnosis present

## 2020-10-17 DIAGNOSIS — Z79899 Other long term (current) drug therapy: Secondary | ICD-10-CM | POA: Diagnosis not present

## 2020-10-17 DIAGNOSIS — E871 Hypo-osmolality and hyponatremia: Secondary | ICD-10-CM | POA: Diagnosis present

## 2020-10-17 DIAGNOSIS — E785 Hyperlipidemia, unspecified: Secondary | ICD-10-CM | POA: Diagnosis present

## 2020-10-17 DIAGNOSIS — Z8589 Personal history of malignant neoplasm of other organs and systems: Secondary | ICD-10-CM | POA: Diagnosis not present

## 2020-10-17 LAB — HEMOGLOBIN A1C
Hgb A1c MFr Bld: 6.8 % — ABNORMAL HIGH (ref 4.8–5.6)
Mean Plasma Glucose: 148 mg/dL

## 2020-10-17 LAB — GLUCOSE, CAPILLARY
Glucose-Capillary: 166 mg/dL — ABNORMAL HIGH (ref 70–99)
Glucose-Capillary: 245 mg/dL — ABNORMAL HIGH (ref 70–99)
Glucose-Capillary: 160 mg/dL — ABNORMAL HIGH (ref 70–99)
Glucose-Capillary: 262 mg/dL — ABNORMAL HIGH (ref 70–99)

## 2020-10-17 LAB — BASIC METABOLIC PANEL
Anion gap: 7 (ref 5–15)
BUN: 16 mg/dL (ref 8–23)
CO2: 24 mmol/L (ref 22–32)
Calcium: 8.3 mg/dL — ABNORMAL LOW (ref 8.9–10.3)
Chloride: 97 mmol/L — ABNORMAL LOW (ref 98–111)
Creatinine, Ser: 1.08 mg/dL — ABNORMAL HIGH (ref 0.44–1.00)
GFR, Estimated: 55 mL/min — ABNORMAL LOW (ref >60)
Glucose, Bld: 175 mg/dL — ABNORMAL HIGH (ref 70–99)
Potassium: 3.8 mmol/L (ref 3.5–5.1)
Sodium: 128 mmol/L — ABNORMAL LOW (ref 135–145)

## 2020-10-17 LAB — CBC
HCT: 32.5 % — ABNORMAL LOW (ref 36.0–46.0)
Hemoglobin: 10.9 g/dL — ABNORMAL LOW (ref 12.0–15.0)
MCH: 32.5 pg (ref 26.0–34.0)
MCHC: 33.5 g/dL (ref 30.0–36.0)
MCV: 97 fL (ref 80.0–100.0)
Platelets: 23 10*3/uL — CL (ref 150–400)
RBC: 3.35 MIL/uL — ABNORMAL LOW (ref 3.87–5.11)
RDW: 15.1 % (ref 11.5–15.5)
WBC: 2.6 10*3/uL — ABNORMAL LOW (ref 4.0–10.5)
nRBC: 0 % (ref 0.0–0.2)

## 2020-10-17 LAB — MRSA NEXT GEN BY PCR, NASAL: MRSA by PCR Next Gen: NOT DETECTED

## 2020-10-17 MED ORDER — HYDRALAZINE HCL 20 MG/ML IJ SOLN: 10.0000 mg | INTRAMUSCULAR | Status: DC | PRN

## 2020-10-17 MED ORDER — SODIUM CHLORIDE 0.9 % IV SOLN: INTRAVENOUS | Status: DC

## 2020-10-17 MED ORDER — CHLORHEXIDINE GLUCONATE CLOTH 2 % EX PADS
6.0000 | MEDICATED_PAD | Freq: Every day | CUTANEOUS | Status: DC
  Administered 2020-10-17 – 2020-10-18 (×2): 6 via TOPICAL

## 2020-10-17 MED ORDER — HYDRALAZINE HCL 20 MG/ML IJ SOLN: 10.0000 mg | Freq: Four times a day (QID) | INTRAMUSCULAR | Status: DC | PRN

## 2020-10-17 MED ORDER — AMLODIPINE BESYLATE 5 MG PO TABS
5.0000 mg | ORAL_TABLET | Freq: Every day | ORAL | Status: DC
  Administered 2020-10-17 – 2020-10-18 (×2): 5 mg via ORAL
  Filled 2020-10-17 (×2): qty 1

## 2020-10-17 MED ORDER — BENZOCAINE 10 % MT GEL
Freq: Three times a day (TID) | OROMUCOSAL | Status: DC | PRN
  Filled 2020-10-17: qty 9

## 2020-10-17 NOTE — ED Notes (Signed)
Patient given meal tray and diet ginger ale at this time.

## 2020-10-17 NOTE — Progress Notes (Signed)
Patient Demographics:    Brittany Russo, is a 75 y.o. female, DOB - 1945-04-23, [id number]  Admit date - 10/16/2020   Admitting Physician Janeya Deyo Denton Brick, MD  Outpatient Primary MD for the patient is Rosine Door  LOS - 0   Chief Complaint  Patient presents with   Abnormal Lab        Subjective:    Brittany Russo today has no fevers, no emesis,  No chest pain,   -Patient's husband and daughter at bedside they both say that patient appears more coherent but Not quite back to baseline at this time  Assessment  & Plan :    Principal Problem:   Thrombocytopenia ([place]) Active Problems:   DM2 (diabetes mellitus, type 2) (HCC)   Hypertension   Depression   Cirrhosis ([place])   UTI (urinary tract infection)   Above knee amputation of right lower extremity (HCC)   Hyponatremia   Acute metabolic encephalopathy  Brief Summary:-  75 y.o. female with medical history significant for liver cirrhosis with encephalopathy, diabetes mellitus, hypertension--admitted on 10/16/2020 with worsening pancytopenia and acute metabolic encephalopathy in the setting of low sodium of 123  A/p  1) acute metabolic encephalopathy -Most likely due to hyponatremia ,,, Na 123 >> 128-- -Her oral intake is not great, continue to hydrate and avoid excessive free water --Patient's husband and daughter at bedside they both say that patient appears more coherent but Not quite back to baseline at this time -Serum ammonia from 10/15/2020 was 44    2) pancytopenia--- related to underlying liver cirrhosis Platelets 28 >> 23--patient is scheduled for extraction of at least 6 teeth on Monday, November 1 --We will empirically transfuse platelets and give vitamin K to minimize risk of bleeding  3) Liver Cirrhosis secondary to NASH--suspect patient's current encephalopathy is mostly due to #1 above rather than liver  cirrhosis -Continue lactulose and rifaximin as well as atenolol and  Aldactone -Hold Lasix until oral intake improves  4)DM2--- A1c 6.8 reflecting good diabetic control PTA -Use Novolog/Humalog Sliding scale insulin with Accu-Cheks/Fingersticks as ordered  -Continue to hold Janumet, continue Lantus  5) ambulatory dysfunction--- patient is a status post prior right AKA, having some difficulties with left knee pain and right AKA stump prosthesis issues -She mostly gets around in a wheelchair  6)HTN-continue amlodipine, Aldactone, atenolol  Disposition/Need for in-Hospital Stay- patient unable to be discharged at this time due to ongoing encephalopathy due to low sodium, inadequate oral intake requiring IV fluids for now  Status is: Inpatient  Remains inpatient appropriate because: Please see disposition above  Disposition: The patient is from: Home              Anticipated d/c is to: Home              Anticipated d/c date is: 1 day              Patient currently is not medically stable to d/c. Barriers: Not Clinically Stable-   Code Status :  -  Code Status: Full Code   Family Communication:  Discussed with daughter and husband at bedside  Consults  :  na  DVT Prophylaxis  :   - SCDs  SCDs Start: 10/16/20 2113  Lab Results  Component Value Date   PLT 23 (LL) 10/17/2020    Inpatient Medications  Scheduled Meds:  ALPRAZolam  0.25 mg Oral BID   amLODipine  5 mg Oral Daily   atenolol  50 mg Oral q morning   atorvastatin  80 mg Oral Q2000   buPROPion  150 mg Oral q AM   Chlorhexidine Gluconate Cloth  6 each Topical Daily   escitalopram  20 mg Oral q AM   insulin aspart  0-5 Units Subcutaneous QHS   insulin aspart  0-9 Units Subcutaneous TID WC   insulin glargine-yfgn  10 Units Subcutaneous Daily   lactulose  30 g Oral Daily   loratadine  10 mg Oral q AM   pantoprazole  40 mg Oral Daily   rifaximin  550 mg Oral BID   spironolactone  100 mg Oral q AM   Continuous  Infusions:  sodium chloride 75 mL/hr at 10/17/20 0858   PRN Meds:.acetaminophen **OR** acetaminophen, benzocaine, gabapentin, guaiFENesin-dextromethorphan, hydrALAZINE, promethazine, traZODone    Anti-infectives (From admission, onward)    Start     Dose/Rate Route Frequency Ordered Stop   10/16/20 2200  rifaximin (XIFAXAN) tablet 550 mg        550 mg Oral 2 times daily 10/16/20 2112     10/16/20 1600  cefTRIAXone (ROCEPHIN) 1 g in sodium chloride 0.9 % 100 mL IVPB        1 g 200 mL/hr over 30 Minutes Intravenous  Once 10/16/20 1551 10/16/20 1633         Objective:   Vitals:   10/17/20 0900 10/17/20 1000 10/17/20 1100 10/17/20 1200  BP: (!) 170/68 (!) 139/55 (!) 167/51 (!) 151/59  Pulse: 66 68 63 67  Resp: 17 18 17 14   Temp:      TempSrc:      SpO2: 100% 100% 100% 100%  Weight:      Height:        Wt Readings from Last 3 Encounters:  10/17/20 58.8 kg  01/09/20 58.6 kg  09/11/19 65.8 kg     Intake/Output Summary (Last 24 hours) at 10/17/2020 1525 Last data filed at 10/16/2020 1633 Gross per 24 hour  Intake 600.27 ml  Output --  Net 600.27 ml    Physical Exam  Gen:- Awake Alert, less confused less lethargic HEENT:- [place].AT, No sclera icterus Neck-Supple Neck,No JVD,.  Lungs-  CTAB , fair symmetrical air movement CV- S1, S2 normal, regular  Abd-  +ve B.Sounds, Abd Soft, No tenderness,    Extremity/Skin:- No  edema, pedal pulses present , Rt AKA Psych-affect is appropriate, occasional episodes of disorientation and confusion neuro-generalized weakness, no new focal deficits, no tremors   Data Review:   Micro Results Recent Results (from the past 240 hour(s))  Resp Panel by RT-PCR (Flu A&B, Covid) Nasopharyngeal Swab     Status: None   Collection Time: 10/16/20  3:32 PM   Specimen: Nasopharyngeal Swab; Nasopharyngeal(NP) swabs in vial transport medium  Result Value Ref Range Status   SARS Coronavirus 2 by RT PCR NEGATIVE NEGATIVE Final    Comment:  (NOTE) SARS-CoV-2 target nucleic acids are NOT DETECTED.  The SARS-CoV-2 RNA is generally detectable in upper respiratory specimens during the acute phase of infection. The lowest concentration of SARS-CoV-2 viral copies this assay can detect is 138 copies/mL. A negative result does not preclude SARS-Cov-2 infection and should not be used as the sole basis for treatment or other patient management decisions. A negative result  may occur with  improper specimen collection/handling, submission of specimen other than nasopharyngeal swab, presence of viral mutation(s) within the areas targeted by this assay, and inadequate number of viral copies(<138 copies/mL). A negative result must be combined with clinical observations, patient history, and epidemiological information. The expected result is Negative.  Fact Sheet for Patients:  EntrepreneurPulse.com.au  Fact Sheet for Healthcare Providers:  IncredibleEmployment.be  This test is no t yet approved or cleared by the [place] FDA and  has been authorized for detection and/or diagnosis of SARS-CoV-2 by FDA under an Emergency Use Authorization (EUA). This EUA will remain  in effect (meaning this test can be used) for the duration of the COVID-19 declaration under Section 564(b)(1) of the Act, 21 U.S.C.section 360bbb-3(b)(1), unless the authorization is terminated  or revoked sooner.       Influenza A by PCR NEGATIVE NEGATIVE Final   Influenza B by PCR NEGATIVE NEGATIVE Final    Comment: (NOTE) The Xpert Xpress SARS-CoV-2/FLU/RSV plus assay is intended as an aid in the diagnosis of influenza from Nasopharyngeal swab specimens and should not be used as a sole basis for treatment. Nasal washings and aspirates are unacceptable for Xpert Xpress SARS-CoV-2/FLU/RSV testing.  Fact Sheet for Patients: EntrepreneurPulse.com.au  Fact Sheet for Healthcare  Providers: IncredibleEmployment.be  This test is not yet approved or cleared by the [place] FDA and has been authorized for detection and/or diagnosis of SARS-CoV-2 by FDA under an Emergency Use Authorization (EUA). This EUA will remain in effect (meaning this test can be used) for the duration of the COVID-19 declaration under Section 564(b)(1) of the Act, 21 U.S.C. section 360bbb-3(b)(1), unless the authorization is terminated or revoked.  Performed at [facility], [address] [place], [place] [address]   MRSA Next Gen by PCR, Nasal     Status: None   Collection Time: 10/17/20  6:58 AM   Specimen: Nasal Mucosa; Nasal Swab  Result Value Ref Range Status   MRSA by PCR Next Gen NOT DETECTED NOT DETECTED Final    Comment: (NOTE) The GeneXpert MRSA Assay (FDA approved for NASAL specimens only), is one component of a comprehensive MRSA colonization surveillance program. It is not intended to diagnose MRSA infection nor to guide or monitor treatment for MRSA infections. Test performance is not FDA approved in patients less than 109 years old. Performed at [facility], [address] [place], [place] [address]     Radiology Reports CT Head Wo Contrast  Result Date: 10/16/2020 CLINICAL DATA:  Delirium confusion EXAM: CT HEAD WITHOUT CONTRAST TECHNIQUE: Contiguous axial images were obtained from the base of the skull through the vertex without intravenous contrast. COMPARISON:  None. FINDINGS: Brain: No evidence of acute infarction, hemorrhage, extra-axial collection, ventriculomegaly, or mass effect. Generalized cerebral atrophy. Periventricular white matter low attenuation likely secondary to microangiopathy. Vascular: Cerebrovascular atherosclerotic calcifications are noted. Skull: Negative for fracture or focal lesion. Sinuses/Orbits: Visualized portions of the orbits are unremarkable. Visualized portions of the paranasal sinuses are unremarkable. Visualized  portions of the mastoid air cells are unremarkable. Other: None. IMPRESSION: No acute intracranial pathology. Electronically Signed   By: Kathreen Devoid   On: 10/16/2020 15:28     CBC Recent Labs  Lab 10/16/20 1238 10/17/20 0508  WBC 3.5* 2.6*  HGB 11.8* 10.9*  HCT 35.1* 32.5*  PLT 28* 23*  MCV 96.4 97.0  MCH 32.4 32.5  MCHC 33.6 33.5  RDW 14.9 15.1  LYMPHSABS 0.4*  --   MONOABS 0.4  --  EOSABS 0.0  --   BASOSABS 0.0  --     Chemistries  Recent Labs  Lab 10/16/20 1238 10/17/20 0508  NA 123* 128*  K 4.6 3.8  CL 92* 97*  CO2 25 24  GLUCOSE 176* 175*  BUN 17 16  CREATININE 0.98 1.08*  CALCIUM 8.5* 8.3*  AST 49*  --   ALT 33  --   ALKPHOS 84  --   BILITOT 1.5*  --    ------------------------------------------------------------------------------------------------------------------ No results for input(s): CHOL, HDL, LDLCALC, TRIG, CHOLHDL, LDLDIRECT in the last 72 hours.  Lab Results  Component Value Date   HGBA1C 6.8 (H) 10/16/2020   ------------------------------------------------------------------------------------------------------------------ Recent Labs    10/16/20 1725  TSH 1.282   ------------------------------------------------------------------------------------------------------------------ No results for input(s): VITAMINB12, FOLATE, FERRITIN, TIBC, IRON, RETICCTPCT in the last 72 hours.  Coagulation profile Recent Labs  Lab 10/16/20 1609  INR 1.3*    No results for input(s): DDIMER in the last 72 hours.  Cardiac Enzymes No results for input(s): CKMB, TROPONINI, MYOGLOBIN in the last 168 hours.  Invalid input(s): CK ------------------------------------------------------------------------------------------------------------------ No results found for: BNP   Roxan Hockey M.D on 10/17/2020 at 3:25 PM  Go to [email or web address] - for contact info  [facility] - Office  [phone]

## 2020-10-18 DIAGNOSIS — D696 Thrombocytopenia, unspecified: Secondary | ICD-10-CM | POA: Diagnosis not present

## 2020-10-18 LAB — COMPREHENSIVE METABOLIC PANEL
ALT: 27 U/L (ref 0–44)
AST: 24 U/L (ref 15–41)
Albumin: 2.5 g/dL — ABNORMAL LOW (ref 3.5–5.0)
Alkaline Phosphatase: 65 U/L (ref 38–126)
Anion gap: 4 — ABNORMAL LOW (ref 5–15)
BUN: 17 mg/dL (ref 8–23)
CO2: 22 mmol/L (ref 22–32)
Calcium: 7.9 mg/dL — ABNORMAL LOW (ref 8.9–10.3)
Chloride: 104 mmol/L (ref 98–111)
Creatinine, Ser: 0.78 mg/dL (ref 0.44–1.00)
GFR, Estimated: >60 mL/min (ref >60)
Glucose, Bld: 154 mg/dL — ABNORMAL HIGH (ref 70–99)
Potassium: 4.3 mmol/L (ref 3.5–5.1)
Sodium: 130 mmol/L — ABNORMAL LOW (ref 135–145)
Total Bilirubin: 1.1 mg/dL (ref 0.3–1.2)
Total Protein: 4.9 g/dL — ABNORMAL LOW (ref 6.5–8.1)

## 2020-10-18 LAB — GLUCOSE, CAPILLARY
Glucose-Capillary: 126 mg/dL — ABNORMAL HIGH (ref 70–99)
Glucose-Capillary: 271 mg/dL — ABNORMAL HIGH (ref 70–99)
Glucose-Capillary: 284 mg/dL — ABNORMAL HIGH (ref 70–99)

## 2020-10-18 LAB — CBC
HCT: 30.2 % — ABNORMAL LOW (ref 36.0–46.0)
Hemoglobin: 9.7 g/dL — ABNORMAL LOW (ref 12.0–15.0)
MCH: 32.1 pg (ref 26.0–34.0)
MCHC: 32.1 g/dL (ref 30.0–36.0)
MCV: 100 fL (ref 80.0–100.0)
Platelets: 23 10*3/uL — CL (ref 150–400)
RBC: 3.02 MIL/uL — ABNORMAL LOW (ref 3.87–5.11)
RDW: 15 % (ref 11.5–15.5)
WBC: 2.6 10*3/uL — ABNORMAL LOW (ref 4.0–10.5)
nRBC: 0 % (ref 0.0–0.2)

## 2020-10-18 LAB — PROTIME-INR
INR: 1.5 — ABNORMAL HIGH (ref 0.8–1.2)
Prothrombin Time: 17.8 seconds — ABNORMAL HIGH (ref 11.4–15.2)

## 2020-10-18 LAB — AMMONIA: Ammonia: 61 umol/L — ABNORMAL HIGH (ref 9–35)

## 2020-10-18 MED ORDER — SODIUM CHLORIDE 0.9% IV SOLUTION: Freq: Once | INTRAVENOUS | Status: AC

## 2020-10-18 MED ORDER — BENZONATATE 100 MG PO CAPS: 100.0000 mg | ORAL_CAPSULE | Freq: Three times a day (TID) | ORAL | 0 refills | Status: DC | PRN

## 2020-10-18 MED ORDER — FUROSEMIDE 10 MG/ML IJ SOLN
20.0000 mg | Freq: Once | INTRAMUSCULAR | Status: AC
  Administered 2020-10-18: 20 mg via INTRAVENOUS
  Filled 2020-10-18: qty 2

## 2020-10-18 MED ORDER — GABAPENTIN 300 MG PO CAPS: 300.0000 mg | ORAL_CAPSULE | Freq: Two times a day (BID) | ORAL | Status: DC | PRN

## 2020-10-18 MED ORDER — ATENOLOL 25 MG PO TABS: 25.0000 mg | ORAL_TABLET | Freq: Every day | ORAL | 1 refills | Status: AC

## 2020-10-18 MED ORDER — FERROUS SULFATE 325 (65 FE) MG PO TABS: 325.0000 mg | ORAL_TABLET | Freq: Two times a day (BID) | ORAL | 3 refills | Status: AC

## 2020-10-18 MED ORDER — LACTULOSE 10 GM/15ML PO SOLN: 30.0000 g | Freq: Every day | ORAL | Status: DC

## 2020-10-18 MED ORDER — SPIRONOLACTONE 100 MG PO TABS: 100.0000 mg | ORAL_TABLET | Freq: Every morning | ORAL | 3 refills | Status: DC

## 2020-10-18 MED ORDER — PHYTONADIONE 5 MG PO TABS
5.0000 mg | ORAL_TABLET | Freq: Once | ORAL | Status: AC
  Administered 2020-10-18: 5 mg via ORAL
  Filled 2020-10-18: qty 1

## 2020-10-18 MED ORDER — FUROSEMIDE 20 MG PO TABS: 20.0000 mg | ORAL_TABLET | Freq: Every day | ORAL | 3 refills | Status: AC

## 2020-10-18 MED ORDER — BENZONATATE 100 MG PO CAPS
100.0000 mg | ORAL_CAPSULE | Freq: Three times a day (TID) | ORAL | Status: DC | PRN
  Administered 2020-10-18: 100 mg via ORAL
  Filled 2020-10-18: qty 1

## 2020-10-18 NOTE — Discharge Summary (Signed)
Brittany Russo, is a 75 y.o. female  DOB [birth year]  MRN [id number].  Admission date:  10/16/2020  Admitting Physician  Roxan Hockey, MD  Discharge Date:  10/18/2020   Primary MD  Rosalee Kaufman, PA-C  Recommendations for primary care physician for things to follow:   1)Avoid ibuprofen/Advil/Aleve/Motrin/Goody Powders/Naproxen/BC powders/Meloxicam/Diclofenac/Indomethacin and other Nonsteroidal anti-inflammatory medications as these will make you more likely to bleed and can cause stomach ulcers, can also cause Kidney problems.   2) please note the changes/adjustments to your medications  3)Please have Repeat CBC, BMP and PT/INR Blood Test on Sunday 10/20/20--- your dental work on Monday, 10/21/2020 may have to be postponed if your platelet counts are too low or your INR is too high------- please let your dentist/oral maxillar surgeon know the results of your CBC and PT/INR test before you have your dental procedure on Monday, 10/21/2020 to reduce the risk of bleeding  4) avoid excessive free water due to concerns for low sodium, okay to drink other liquids  5) your goal is to have 2-3 soft/loose bowel movements each day otherwise if ammonia levels will go up and you become confused/disoriented--- your lactulose dose can be adjusted to achieve 2-3 bowel movements a day   Admission Diagnosis  Hyponatremia [E87.1] Thrombocytopenia (HCC) [D69.6] Acute cystitis with hematuria [W40.97] Acute metabolic encephalopathy [D53.29]   Discharge Diagnosis  Hyponatremia [E87.1] Thrombocytopenia ([place]) [D69.6] Acute cystitis with hematuria [J24.26] Acute metabolic encephalopathy [S34.19]    Principal Problem:   Thrombocytopenia ([place]) Active Problems:   DM2 (diabetes mellitus, type 2) ([place])   Hypertension   Depression   Cirrhosis ([place])   UTI (urinary tract infection)   Above knee amputation of right lower  extremity ([place])   Hyponatremia   Acute metabolic encephalopathy      Past Medical History:  Diagnosis Date   Acute and subacute hepatic failure without coma    Allergy    Anasarca    Anxiety    Arthritis    Ascites 09/2018   no SBP on 850 cc tap 10/13/18   Asthma    allery induced   Cirrhosis of liver ([place]) ~ 2004   from NAFLD.  Dx ~ 2004.  Dr Dorcas Mcmurray at [facility]/     Depression    Diabetes mellitus without complication [facility])    type 2   Eczema of both hands    Endometrial cancer ([place]) 1996, 2003   hysterectomy 1996.  recurrence 2003, treated with radiation.     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, DR Allyson Sabal Mir at [place] in [place]   Hyperlipidemia    Hypertension    Muscle weakness    Seizures ([place])    last seizure June 07, 2014    Thrombocytopenia [facility])    Thyroid nodule     Past Surgical History:  Procedure Laterality Date   AMPUTATION Right 06/29/2019   Procedure: AMPUTATION ABOVE KNEE through knee;  Surgeon: Alvan Dame,  Rodman Key, MD;  Location: [facility];  Service: Orthopedics;  Laterality: Right;  2 hrs   CATARACT EXTRACTION W/PHACO Left 06/04/2017   Procedure: CATARACT EXTRACTION PHACO AND INTRAOCULAR LENS PLACEMENT (IOC);  Surgeon: Baruch Goldmann, MD;  Location: [facility];  Service: Ophthalmology;  Laterality: Left;  CDE: 3.90   CATARACT EXTRACTION W/PHACO Right 07/15/2017   Procedure: CATARACT EXTRACTION PHACO AND INTRAOCULAR LENS PLACEMENT (IOC);  Surgeon: Baruch Goldmann, MD;  Location: [facility];  Service: Ophthalmology;  Laterality: Right;  CDE: 7.60   COLONOSCOPY  06/2007   in [place]. for hematochzia.  radiation proctitis   DILATION AND CURETTAGE OF UTERUS     ESOPHAGOGASTRODUODENOSCOPY  2016   ESOPHAGOGASTRODUODENOSCOPY (EGD) WITH PROPOFOL N/A 12/30/2018   Procedure: EGD;  Surgeon: Clarene Essex, MD;  Location[facility];  Service: Endoscopy;  Laterality: N/A;   EXCISIONAL TOTAL KNEE ARTHROPLASTY  Right 01/03/2019   Procedure: EXCISIONAL TOTAL KNEE ARTHROPLASTY;  Surgeon: Paralee Cancel, MD;  [facility];  Service: Orthopedics;  Laterality: Right;   FLEXIBLE SIGMOIDOSCOPY N/A 12/30/2018   Procedure: FLEXIBLE SIGMOIDOSCOPY;  Surgeon: Clarene Essex, MD;  [facility];  Service: Endoscopy;  Laterality: N/A;   IR PARACENTESIS  10/13/2018   IR PARACENTESIS  07/21/2019   IR THORACENTESIS ASP PLEURAL SPACE W/IMG GUIDE  07/19/2019   STERIOD INJECTION Left 06/29/2019   Procedure: LEFT KNEE INJECTION;  Surgeon: Paralee Cancel, MD;  [facility];  Service: Orthopedics;  Laterality: Left;   TOTAL ABDOMINAL HYSTERECTOMY  1996   TOTAL KNEE ARTHROPLASTY Right 09/22/2018   Procedure: TOTAL KNEE ARTHROPLASTY;  Surgeon: Paralee Cancel, MD;  [facility];  Service: Orthopedics;  Laterality: Right;  70 mins   TOTAL KNEE REVISION Right 09/27/2018   Procedure: TOTAL KNEE REVISION;  Surgeon: Paralee Cancel, MD;  [facility];  Service: Orthopedics;  Laterality: Right;   TOTAL KNEE REVISION Right 11/22/2018   Procedure: repair right knee extensor mechanism;  Surgeon: Paralee Cancel, MD;  [facility];  Service: Orthopedics;  Laterality: Right;  90 mins      HPI  from the history and physical done on the day of admission:    Chief Complaint: Low platelets   HPI: TENA LINEBAUGH is a 75 y.o. female with medical history significant for liver cirrhosis with encephalopathy, diabetes mellitus, hypertension. Patient had blood work drawn yesterday, was sent to the ED with reports of low platelets.  No bleeding from any orifices, no black stools.  Daughter also says within the past 2 weeks to 4 weeks, patient completed treatment for urinary tract infection, ~ 5 course, she cannot remember the name of the antibiotic. Patient has chronic urinary frequency which is unchanged, as she is on Lasix 20 twice daily.  No dysuria, no lower abdominal pain.  No confusion which she usually has with a urinary tract  infection. Patient has had poor appetite over the past few days.  No vomiting no loose stools.   ED Course: Tachycardic in the 60s, stable vitals.  Platelets 28.  Sodium 123.  UA with large leukocytes greater than 50 WBC with few bacteria.  Head CT unremarkable.  IV ceftriaxone started, 500 mill bolus normal saline given.  Hospitalist to admit for hyponatremia and thrombocytopenia.   Review of Systems: As per HPI all other systems reviewed and negative     Hospital Course:        Brief Summary:-  75 y.o. female with medical history significant for liver cirrhosis with encephalopathy, diabetes mellitus, hypertension--admitted on 10/16/2020  with worsening pancytopenia and acute metabolic encephalopathy in the setting of low sodium of 123   A/p  1) acute metabolic encephalopathy -Most likely due to hyponatremia ,,, Na 123 >> 128-->>130 -Her oral intake is improving -avoid excessive free water, okay to drink a liquid --Patient's husband and daughter at bedside they both say that patient appears more coherent and pretty much back to baseline e -Serum ammonia is 61--   2) pancytopenia--- related to underlying liver cirrhosis Platelets 28 >> 23 >>23--patient is scheduled for extraction of at least 6 teeth on Monday, November 1 -Patient platelets remain low at 23, no spontaneous bleeding, INR is 1.5 -empirically transfused platelets 2 units of platelets and and gave vitamin K  100m po x 1 to minimize risk of bleeding from her dental procedure that is scheduled for 10/21/2020 -Patient and family are advised to recheck CBC and PT/INR on 10/20/2020 and let the dentist/oral maxillary surgeon know the results of the PT/INR and platelet count to determine if they can proceed with her dental procedure scheduled for 10/21/2020   3) Liver Cirrhosis secondary to NASH--suspect patient's current encephalopathy is mostly due to #1 above rather than liver cirrhosis -Continue lactulose and rifaximin as well as  atenolol and  Aldactone INR is 1.5--vitamin K as above pain #2 -Serum ammonia 61 ,patient is coherent and alert and appropriate   4)DM2--- A1c 6.8 reflecting good diabetic control PTA -Okay to resume Janumet, continue Lantus   5) ambulatory dysfunction--- patient is a status post prior right AKA, having some difficulties with left knee pain and right AKA stump prosthesis issues -She mostly gets around in a wheelchair   6)HTN-continue amlodipine, Aldactone, atenolol   Disposition--discharge home with family and home health services   Disposition: The patient is from: Home              Anticipated d/c is to: Home               Code Status :  -  Code Status: Full Code    Family Communication:  Discussed with daughter MThreasa Beardsand husband at bedside   Discharge Condition: stable  Follow UP   Follow-up Information     SRosalee Kaufman [place] Schedule an appointment as soon as possible for a visit in 1 week(s).   Specialty: Physician Assistant Contact information: [phone]        BArnoldo Lenis MD .   Specialty: Cardiology Contact information: [phone]                 Consults obtained - na  Diet and Activity recommendation:  As advised  Discharge Instructions    Discharge Instructions     Call MD for:  difficulty breathing, headache or visual disturbances   Complete by: As directed    Call MD for:  persistant dizziness or light-headedness   Complete by: As directed    Call MD for:  persistant nausea and vomiting   Complete by: As directed    Call MD for:  temperature >100.4   Complete by: As directed    Diet Carb Modified   Complete by: As directed    Discharge instructions   Complete by: As directed    1)Avoid ibuprofen/Advil/Aleve/Motrin/Goody Powders/Naproxen/BC powders/Meloxicam/Diclofenac/Indomethacin and other Nonsteroidal anti-inflammatory  medications as these will make you more likely to bleed and can cause stomach ulcers, can also cause Kidney problems.  2) please note the changes/adjustments to your medications  3)Please have Repeat CBC, BMP and PT/INR Blood Test on Sunday 10/20/20--- your dental work on Monday, 10/21/2020 may have to be postponed if your platelet counts are too low or your INR is too high------- please let your dentist/oral maxillar surgeon know the results of your CBC and PT/INR test before you have your dental procedure on Monday, 10/21/2020 to reduce the risk of bleeding  4) avoid excessive free water due to concerns for low sodium, okay to drink other liquids  5) your goal is to have 2-3 soft/loose bowel movements each day otherwise if ammonia levels will go up and you become confused/disoriented--- your lactulose dose can be adjusted to achieve 2-3 bowel movements a day   Increase activity slowly   Complete by: As directed          Discharge Medications     Allergies as of 10/18/2020       Reactions   Asa [aspirin] Other (See Comments)   Pt not allergic but cannot take due to bleeding    Other Other (See Comments)   Any jewelry metal-hives and itching         Medication List     STOP taking these medications    Enulose 10 GM/15ML Soln Generic drug: lactulose (encephalopathy)   lip balm ointment   potassium chloride 10 MEQ tablet Commonly known as: KLOR-CON   traMADol 50 MG tablet Commonly known as: ULTRAM       TAKE these medications    acetaminophen 325 MG tablet Commonly known as: TYLENOL Take 1-2 tablets (325-650 mg total) by mouth every 4 (four) hours as needed for mild pain.   ALPRAZolam 0.25 MG tablet Commonly known as: XANAX Take 1 tablet (0.25 mg total) by mouth 2 (two) times daily.   atenolol 25 MG tablet Commonly known as: TENORMIN Take 1 tablet (25 mg total) by mouth daily. What changed:  how much to take when to take this   atorvastatin 80 MG  tablet Commonly known as: LIPITOR Take 80 mg by mouth daily at 8 pm.   Basaglar KwikPen 100 UNIT/ML Inject 25 Units into the skin See admin instructions. Inject 25 units if sugar is mid 100's.   benzonatate 100 MG capsule Commonly known as: TESSALON Take 1 capsule (100 mg total) by mouth 3 (three) times daily as needed for cough.   buPROPion 150 MG 24 hr tablet Commonly known as: WELLBUTRIN XL Take 150 mg by mouth in the morning.   escitalopram 20 MG tablet Commonly known as: LEXAPRO Take 20 mg by mouth in the morning.   ferrous sulfate 325 (65 FE) MG tablet Commonly known as: FerrouSul Take 1 tablet (325 mg total) by mouth 2 (two) times daily with a meal.   fluticasone 50 MCG/ACT nasal spray Commonly known as: FLONASE Place 1 spray into both nostrils in the morning.   furosemide 20 MG tablet Commonly known as: LASIX Take 1 tablet (20 mg total) by mouth daily. What changed: when to take this   gabapentin 300 MG capsule Commonly known as: NEURONTIN Take 1 capsule (300 mg total) by mouth 2 (two) times daily as needed (for nerve pain).   lactulose 10 GM/15ML solution Commonly known as: CHRONULAC Take 45 mLs (30 g total) by mouth daily.   loratadine 10 MG tablet Commonly known as: CLARITIN Take 10 mg by mouth in the morning.   mesalamine 1000 MG suppository Commonly known as: CANASA Place 1 suppository  rectally at bedtime.   multivitamin with minerals Tabs tablet Take 1 tablet by mouth daily.   NovoLOG FlexPen 100 UNIT/ML FlexPen Generic drug: insulin aspart Inject 4 Units into the skin in the morning and at bedtime. Can be up to 8 units twice a day depending on blood sugar. Take mid afternoon only if bloodsugar  is over 180  Takes in evening if it is 200 or over give up to 10 units per daughter   pantoprazole 40 MG tablet Commonly known as: PROTONIX Take 1 tablet (40 mg total) by mouth daily.   rifaximin 550 MG Tabs tablet Commonly known as: XIFAXAN Take 550  mg by mouth 2 (two) times daily.   sitaGLIPtin-metformin 50-500 MG tablet Commonly known as: JANUMET Take 1 tablet by mouth 2 (two) times daily with a meal.   spironolactone 100 MG tablet Commonly known as: ALDACTONE Take 1 tablet (100 mg total) by mouth in the morning.   traZODone 50 MG tablet Commonly known as: DESYREL Take 50 mg by mouth at bedtime as needed for sleep.   vitamin B-12 500 MCG tablet Commonly known as: CYANOCOBALAMIN Take 1 tablet (500 mcg total) by mouth daily.        Major procedures and Radiology Reports - PLEASE review detailed and final reports for all details, in brief -   CT Head Wo Contrast  Result Date: 10/16/2020 CLINICAL DATA:  Delirium confusion EXAM: CT HEAD WITHOUT CONTRAST TECHNIQUE: Contiguous axial images were obtained from the base of the skull through the vertex without intravenous contrast. COMPARISON:  None. FINDINGS: Brain: No evidence of acute infarction, hemorrhage, extra-axial collection, ventriculomegaly, or mass effect. Generalized cerebral atrophy. Periventricular white matter low attenuation likely secondary to microangiopathy. Vascular: Cerebrovascular atherosclerotic calcifications are noted. Skull: Negative for fracture or focal lesion. Sinuses/Orbits: Visualized portions of the orbits are unremarkable. Visualized portions of the paranasal sinuses are unremarkable. Visualized portions of the mastoid air cells are unremarkable. Other: None. IMPRESSION: No acute intracranial pathology. Electronically Signed   By: Kathreen Devoid   On: 10/16/2020 15:28    Micro Results   Recent Results (from the past 240 hour(s))  Resp Panel by RT-PCR (Flu A&B, Covid) Nasopharyngeal Swab     Status: None   Collection Time: 10/16/20  3:32 PM   Specimen: Nasopharyngeal Swab; Nasopharyngeal(NP) swabs in vial transport medium  Result Value Ref Range Status   SARS Coronavirus 2 by RT PCR NEGATIVE NEGATIVE Final    Comment: (NOTE) SARS-CoV-2 target nucleic  acids are NOT DETECTED.  The SARS-CoV-2 RNA is generally detectable in upper respiratory specimens during the acute phase of infection. The lowest concentration of SARS-CoV-2 viral copies this assay can detect is 138 copies/mL. A negative result does not preclude SARS-Cov-2 infection and should not be used as the sole basis for treatment or other patient management decisions. A negative result may occur with  improper specimen collection/handling, submission of specimen other than nasopharyngeal swab, presence of viral mutation(s) within the areas targeted by this assay, and inadequate number of viral copies(<138 copies/mL). A negative result must be combined with clinical observations, patient history, and epidemiological information. The expected result is Negative.  Fact Sheet for Patients:  EntrepreneurPulse.com.au  Fact Sheet for Healthcare Providers:  IncredibleEmployment.be  This test is no t yet approved or cleared by the [place] FDA and  has been authorized for detection and/or diagnosis of SARS-CoV-2 by FDA under an Emergency Use Authorization (EUA). This EUA will remain  in effect (meaning this test  can be used) for the duration of the COVID-19 declaration under Section 564(b)(1) of the Act, 21 U.S.C.section 360bbb-3(b)(1), unless the authorization is terminated  or revoked sooner.       Influenza A by PCR NEGATIVE NEGATIVE Final   Influenza B by PCR NEGATIVE NEGATIVE Final    Comment: (NOTE) The Xpert Xpress SARS-CoV-2/FLU/RSV plus assay is intended as an aid in the diagnosis of influenza from Nasopharyngeal swab specimens and should not be used as a sole basis for treatment. Nasal washings and aspirates are unacceptable for Xpert Xpress SARS-CoV-2/FLU/RSV testing.  Fact Sheet for Patients: EntrepreneurPulse.com.au  Fact Sheet for Healthcare Providers: IncredibleEmployment.be  This  test is not yet approved or cleared by the [place] FDA and has been authorized for detection and/or diagnosis of SARS-CoV-2 by FDA under an Emergency Use Authorization (EUA). This EUA will remain in effect (meaning this test can be used) for the duration of the COVID-19 declaration under Section 564(b)(1) of the Act, 21 U.S.C. section 360bbb-3(b)(1), unless the authorization is terminated or revoked.  Performed at [facility], [address] [place], [place] [address]   Urine Culture     Status: Abnormal (Preliminary result)   Collection Time: 10/16/20  3:32 PM   Specimen: Urine, Clean Catch  Result Value Ref Range Status   Specimen Description   Final    URINE, CLEAN CATCH Performed at [facility], [address], [place], [place] [address]    Special Requests   Final    NONE Performed at [facility], [address] [place], [place] [address]    Culture >=100,000 COLONIES/mL KLEBSIELLA PNEUMONIAE (A)  Final   Report Status PENDING  Incomplete  MRSA Next Gen by PCR, Nasal     Status: None   Collection Time: 10/17/20  6:58 AM   Specimen: Nasal Mucosa; Nasal Swab  Result Value Ref Range Status   MRSA by PCR Next Gen NOT DETECTED NOT DETECTED Final    Comment: (NOTE) The GeneXpert MRSA Assay (FDA approved for NASAL specimens only), is one component of a comprehensive MRSA colonization surveillance program. It is not intended to diagnose MRSA infection nor to guide or monitor treatment for MRSA infections. Test performance is not FDA approved in patients less than 29 years old. Performed at [facility], [address] [place], [place] [address]        Today   Wilson today has no new complaints -Daughter [name] and husband at bedside, patient is awake, alert, coherent and appropriate -Oral intake improving -No fevers no vomiting          Patient has been seen and examined prior to discharge   Objective   Blood pressure (!) 115/48, pulse  (!) 58, temperature 98.3 F (36.8 C), temperature source Oral, resp. rate 20, height 5' 2"  (1.575 m), weight 61.3 kg, SpO2 99 %.   Intake/Output Summary (Last 24 hours) at 10/18/2020 1442 Last data filed at 10/18/2020 1350 Gross per 24 hour  Intake 2444.3 ml  Output 650 ml  Net 1794.3 ml    Exam Gen:- Awake Alert, no acute distress  HEENT:- [place].AT, No sclera icterus Neck-Supple Neck,No JVD,.  Lungs-  CTAB , good air movement bilaterally  CV- S1, S2 normal, regular Abd-  +ve B.Sounds, Abd Soft, No tenderness,    Extremity/Skin:- No  edema,   good pulses on the Left, Rt BKA Psych-affect is appropriate, oriented x3 Neuro-generalized weakness, no new focal deficits, no tremors  Data Review   CBC w Diff:  Lab Results  Component Value Date   WBC 2.6 (L) 10/18/2020   HGB 9.7 (L) 10/18/2020   HCT 30.2 (L) 10/18/2020   HCT 23.7 (L) 10/13/2018   PLT 23 (LL) 10/18/2020   LYMPHOPCT 12 10/16/2020   BANDSPCT 0 10/10/2018   MONOPCT 13 10/16/2020   EOSPCT 0 10/16/2020   BASOPCT 1 10/16/2020    CMP:  Lab Results  Component Value Date   NA 130 (L) 10/18/2020   NA 130 (A) 06/10/2015   K 4.3 10/18/2020   CL 104 10/18/2020   CO2 22 10/18/2020   BUN 17 10/18/2020   BUN 14 06/10/2015   CREATININE 0.78 10/18/2020   PROT 4.9 (L) 10/18/2020   ALBUMIN 2.5 (L) 10/18/2020   BILITOT 1.1 10/18/2020   ALKPHOS 65 10/18/2020   AST 24 10/18/2020   ALT 27 10/18/2020    Total Discharge time is about 33 minutes  Roxan Hockey M.D on 10/18/2020 at 2:42 PM  Go to [email or web address] -  for contact info  [facility] - Office  [phone]

## 2020-10-18 NOTE — Progress Notes (Signed)
Patient transferred via bed to room 320.  Patient's belongings including wheelchair, briefs, personal care items sent with patient.  Report given to RN, Patient's daughter called and notified of transfer.

## 2020-10-18 NOTE — Care Management Important Message (Signed)
Important Message  Patient Details  Name: Brittany Russo MRN: [id number] Date of Birth: [birth year]   Medicare Important Message Given:  Yes     Tommy Medal 10/18/2020, 11:49 AM

## 2020-10-18 NOTE — TOC Transition Note (Signed)
Transition of Care [facility]) - CM/SW Discharge Note   Patient Details  Name: SHIREL MALLIS MRN: [id number] Date of Birth: 1945/11/27  [facility]) CM/SW Contact:  Natasha Bence, LCSW Phone Number: 10/18/2020, 2:44 PM   Clinical Narrative:    CSW notified of patient's readiness for discharge and [place] needs. Patient's daughter reported that patient will be discharging to a family member's home instead of the patient's residence. HH will need to be done at family member's home. CSW notified patient's [facility] agency, [facility]. [facility] agreeable to provide HH at family member's home. Sudie Bailey [facility] notified. TOC signing off.    Final next level of care: [place] Barriers to Discharge: Barriers Resolved   Patient Goals and CMS Choice Patient states their goals for this hospitalization and ongoing recovery are:: Return home with [facility] CMS Medicare.gov Compare Post Acute Care list provided to:: Patient Choice offered to / list presented to : Patient  Discharge Placement                    Patient and family notified of of transfer: 10/18/20  Discharge Plan and Services                          HH Arranged: RN [facility] Agency: [place] ([place]) Date [place]: 10/18/20 Time [place]: [phone] Representative spoke with at [place]: [place] Determinants of Health ([place]) Interventions     Readmission Risk Interventions No flowsheet data found.

## 2020-10-18 NOTE — Progress Notes (Signed)
Inpatient Diabetes Program Recommendations  AACE/ADA: New Consensus Statement on Inpatient Glycemic Control (2015)  Target Ranges:  Prepandial:   less than 140 mg/dL      Peak postprandial:   less than 180 mg/dL (1-2 hours)      Critically ill patients:  140 - 180 mg/dL   Lab Results  Component Value Date   GLUCAP 271 (H) 10/18/2020   HGBA1C 6.8 (H) 10/16/2020    Review of Glycemic Control Results for [name], Brittany Russo (MRN [id number]) as of 10/18/2020 14:30  Ref. Range 10/17/2020 07:43 10/17/2020 11:58 10/17/2020 16:35 10/17/2020 21:00 10/18/2020 06:57 10/18/2020 12:13  Glucose-Capillary Latest Ref Range: 70 - 99 mg/dL 166 (H) 245 (H) 160 (H) 262 (H) 126 (H) 271 (H)   Diabetes history: DM2 Outpatient Diabetes medications: Lantus 25 units QD, Novolog 4-10 units BID, Janumet 50-500 mg BID Current orders for Inpatient glycemic control: Semglee 10 units QD, Novolog 0-15 units TID and 0-5 units QHS  Inpatient Diabetes Program Recommendations:    Novolog 3 units TID with meals  Will continue to follow while inpatient.  Thank you, Reche Dixon, RN, BSN Diabetes Coordinator Inpatient Diabetes Program [phone] (team pager from 8a-5p)

## 2020-10-18 NOTE — Discharge Instructions (Addendum)
1)Avoid ibuprofen/Advil/Aleve/Motrin/Goody Powders/Naproxen/BC powders/Meloxicam/Diclofenac/Indomethacin and other Nonsteroidal anti-inflammatory medications as these will make you more likely to bleed and can cause stomach ulcers, can also cause Kidney problems.   2)Please note the changes/adjustments to your medications  3)Please have Repeat CBC, BMP and PT/INR Blood Test on Sunday 10/20/20--- your dental work on Monday, 10/21/2020 may have to be postponed if your platelet counts are too low or your INR is too high------- please let your dentist/oral maxillar surgeon know the results of your CBC and PT/INR test before you have your dental procedure on Monday, 10/21/2020 to reduce the risk of bleeding  4)Avoid excessive free water due to concerns for low sodium, okay to drink other liquids  5)Your goal is to have 2-3 soft/loose bowel movements each day otherwise if ammonia levels will go up and you become confused/disoriented--- your lactulose dose can be adjusted to achieve 2-3 bowel movements a day

## 2020-10-18 NOTE — Progress Notes (Signed)
Patient discharged home with discharge instructions. IV removed from RFA. Discharged via w/c to personal vehicle with her Husband

## 2020-10-19 LAB — URINE CULTURE: Culture: >=100000 — AB

## 2020-10-19 LAB — BPAM PLATELET PHERESIS
Blood Product Expiration Date: 202208012359
Blood Product Expiration Date: 202208012359
ISSUE DATE / TIME: 202207291058
ISSUE DATE / TIME: 202207291453
Unit Type and Rh: 5100
Unit Type and Rh: 7300

## 2020-10-19 LAB — PREPARE PLATELET PHERESIS
Unit division: 0
Unit division: 0

## 2020-10-22 DIAGNOSIS — K7460 Unspecified cirrhosis of liver: Secondary | ICD-10-CM | POA: Diagnosis not present

## 2020-10-22 DIAGNOSIS — K7581 Nonalcoholic steatohepatitis (NASH): Secondary | ICD-10-CM | POA: Diagnosis not present

## 2020-10-22 DIAGNOSIS — N3000 Acute cystitis without hematuria: Secondary | ICD-10-CM | POA: Diagnosis not present

## 2020-10-22 DIAGNOSIS — K7290 Hepatic failure, unspecified without coma: Secondary | ICD-10-CM | POA: Diagnosis not present

## 2020-10-22 DIAGNOSIS — E1165 Type 2 diabetes mellitus with hyperglycemia: Secondary | ICD-10-CM | POA: Diagnosis not present

## 2020-10-22 DIAGNOSIS — I10 Essential (primary) hypertension: Secondary | ICD-10-CM | POA: Diagnosis not present

## 2020-10-23 DIAGNOSIS — R300 Dysuria: Secondary | ICD-10-CM | POA: Diagnosis not present

## 2020-10-23 DIAGNOSIS — D609 Acquired pure red cell aplasia, unspecified: Secondary | ICD-10-CM | POA: Diagnosis not present

## 2020-10-29 DIAGNOSIS — N3000 Acute cystitis without hematuria: Secondary | ICD-10-CM | POA: Diagnosis not present

## 2020-10-29 DIAGNOSIS — I10 Essential (primary) hypertension: Secondary | ICD-10-CM | POA: Diagnosis not present

## 2020-10-29 DIAGNOSIS — K7460 Unspecified cirrhosis of liver: Secondary | ICD-10-CM | POA: Diagnosis not present

## 2020-10-29 DIAGNOSIS — K7581 Nonalcoholic steatohepatitis (NASH): Secondary | ICD-10-CM | POA: Diagnosis not present

## 2020-10-29 DIAGNOSIS — K7290 Hepatic failure, unspecified without coma: Secondary | ICD-10-CM | POA: Diagnosis not present

## 2020-10-29 DIAGNOSIS — E1165 Type 2 diabetes mellitus with hyperglycemia: Secondary | ICD-10-CM | POA: Diagnosis not present

## 2020-10-30 DIAGNOSIS — K7290 Hepatic failure, unspecified without coma: Secondary | ICD-10-CM | POA: Diagnosis not present

## 2020-10-30 DIAGNOSIS — F419 Anxiety disorder, unspecified: Secondary | ICD-10-CM | POA: Diagnosis not present

## 2020-10-30 DIAGNOSIS — S78119A Complete traumatic amputation at level between unspecified hip and knee, initial encounter: Secondary | ICD-10-CM | POA: Diagnosis not present

## 2020-10-30 DIAGNOSIS — K7581 Nonalcoholic steatohepatitis (NASH): Secondary | ICD-10-CM | POA: Diagnosis not present

## 2020-10-30 DIAGNOSIS — E1165 Type 2 diabetes mellitus with hyperglycemia: Secondary | ICD-10-CM | POA: Diagnosis not present

## 2020-10-30 DIAGNOSIS — N3090 Cystitis, unspecified without hematuria: Secondary | ICD-10-CM | POA: Diagnosis not present

## 2020-10-30 DIAGNOSIS — I10 Essential (primary) hypertension: Secondary | ICD-10-CM | POA: Diagnosis not present

## 2020-10-30 DIAGNOSIS — I4891 Unspecified atrial fibrillation: Secondary | ICD-10-CM | POA: Diagnosis not present

## 2020-10-30 DIAGNOSIS — D696 Thrombocytopenia, unspecified: Secondary | ICD-10-CM | POA: Diagnosis not present

## 2020-10-31 DIAGNOSIS — D696 Thrombocytopenia, unspecified: Secondary | ICD-10-CM | POA: Diagnosis not present

## 2020-10-31 DIAGNOSIS — D61818 Other pancytopenia: Secondary | ICD-10-CM | POA: Diagnosis not present

## 2020-10-31 DIAGNOSIS — K7581 Nonalcoholic steatohepatitis (NASH): Secondary | ICD-10-CM | POA: Diagnosis not present

## 2020-10-31 DIAGNOSIS — E871 Hypo-osmolality and hyponatremia: Secondary | ICD-10-CM | POA: Diagnosis not present

## 2020-10-31 DIAGNOSIS — K7469 Other cirrhosis of liver: Secondary | ICD-10-CM | POA: Diagnosis not present

## 2020-10-31 DIAGNOSIS — F419 Anxiety disorder, unspecified: Secondary | ICD-10-CM | POA: Diagnosis not present

## 2020-10-31 DIAGNOSIS — Z9181 History of falling: Secondary | ICD-10-CM | POA: Diagnosis not present

## 2020-10-31 DIAGNOSIS — Z7982 Long term (current) use of aspirin: Secondary | ICD-10-CM | POA: Diagnosis not present

## 2020-10-31 DIAGNOSIS — G9341 Metabolic encephalopathy: Secondary | ICD-10-CM | POA: Diagnosis not present

## 2020-10-31 DIAGNOSIS — F32A Depression, unspecified: Secondary | ICD-10-CM | POA: Diagnosis not present

## 2020-10-31 DIAGNOSIS — Z794 Long term (current) use of insulin: Secondary | ICD-10-CM | POA: Diagnosis not present

## 2020-10-31 DIAGNOSIS — N3001 Acute cystitis with hematuria: Secondary | ICD-10-CM | POA: Diagnosis not present

## 2020-10-31 DIAGNOSIS — E785 Hyperlipidemia, unspecified: Secondary | ICD-10-CM | POA: Diagnosis not present

## 2020-10-31 DIAGNOSIS — E119 Type 2 diabetes mellitus without complications: Secondary | ICD-10-CM | POA: Diagnosis not present

## 2020-10-31 DIAGNOSIS — K7290 Hepatic failure, unspecified without coma: Secondary | ICD-10-CM | POA: Diagnosis not present

## 2020-10-31 DIAGNOSIS — I10 Essential (primary) hypertension: Secondary | ICD-10-CM | POA: Diagnosis not present

## 2020-10-31 DIAGNOSIS — Z7984 Long term (current) use of oral hypoglycemic drugs: Secondary | ICD-10-CM | POA: Diagnosis not present

## 2020-11-07 DIAGNOSIS — G9341 Metabolic encephalopathy: Secondary | ICD-10-CM | POA: Diagnosis not present

## 2020-11-07 DIAGNOSIS — E871 Hypo-osmolality and hyponatremia: Secondary | ICD-10-CM | POA: Diagnosis not present

## 2020-11-07 DIAGNOSIS — D696 Thrombocytopenia, unspecified: Secondary | ICD-10-CM | POA: Diagnosis not present

## 2020-11-07 DIAGNOSIS — N3001 Acute cystitis with hematuria: Secondary | ICD-10-CM | POA: Diagnosis not present

## 2020-11-07 DIAGNOSIS — E119 Type 2 diabetes mellitus without complications: Secondary | ICD-10-CM | POA: Diagnosis not present

## 2020-11-07 DIAGNOSIS — I10 Essential (primary) hypertension: Secondary | ICD-10-CM | POA: Diagnosis not present

## 2020-11-11 DIAGNOSIS — E871 Hypo-osmolality and hyponatremia: Secondary | ICD-10-CM | POA: Diagnosis not present

## 2020-11-11 DIAGNOSIS — I10 Essential (primary) hypertension: Secondary | ICD-10-CM | POA: Diagnosis not present

## 2020-11-11 DIAGNOSIS — N3001 Acute cystitis with hematuria: Secondary | ICD-10-CM | POA: Diagnosis not present

## 2020-11-11 DIAGNOSIS — E119 Type 2 diabetes mellitus without complications: Secondary | ICD-10-CM | POA: Diagnosis not present

## 2020-11-11 DIAGNOSIS — D696 Thrombocytopenia, unspecified: Secondary | ICD-10-CM | POA: Diagnosis not present

## 2020-11-11 DIAGNOSIS — G9341 Metabolic encephalopathy: Secondary | ICD-10-CM | POA: Diagnosis not present

## 2020-11-12 DIAGNOSIS — N390 Urinary tract infection, site not specified: Secondary | ICD-10-CM | POA: Diagnosis not present

## 2020-11-17 DIAGNOSIS — I10 Essential (primary) hypertension: Secondary | ICD-10-CM | POA: Diagnosis not present

## 2020-11-17 DIAGNOSIS — F419 Anxiety disorder, unspecified: Secondary | ICD-10-CM | POA: Diagnosis not present

## 2020-11-17 DIAGNOSIS — K7290 Hepatic failure, unspecified without coma: Secondary | ICD-10-CM | POA: Diagnosis not present

## 2020-11-17 DIAGNOSIS — N3090 Cystitis, unspecified without hematuria: Secondary | ICD-10-CM | POA: Diagnosis not present

## 2020-11-17 DIAGNOSIS — E1165 Type 2 diabetes mellitus with hyperglycemia: Secondary | ICD-10-CM | POA: Diagnosis not present

## 2020-11-17 DIAGNOSIS — D696 Thrombocytopenia, unspecified: Secondary | ICD-10-CM | POA: Diagnosis not present

## 2020-11-17 DIAGNOSIS — S78119A Complete traumatic amputation at level between unspecified hip and knee, initial encounter: Secondary | ICD-10-CM | POA: Diagnosis not present

## 2020-11-17 DIAGNOSIS — K7581 Nonalcoholic steatohepatitis (NASH): Secondary | ICD-10-CM | POA: Diagnosis not present

## 2020-11-20 DIAGNOSIS — E871 Hypo-osmolality and hyponatremia: Secondary | ICD-10-CM | POA: Diagnosis not present

## 2020-11-20 DIAGNOSIS — G9341 Metabolic encephalopathy: Secondary | ICD-10-CM | POA: Diagnosis not present

## 2020-11-20 DIAGNOSIS — K7469 Other cirrhosis of liver: Secondary | ICD-10-CM | POA: Diagnosis not present

## 2020-11-20 DIAGNOSIS — E119 Type 2 diabetes mellitus without complications: Secondary | ICD-10-CM | POA: Diagnosis not present

## 2020-11-20 DIAGNOSIS — D696 Thrombocytopenia, unspecified: Secondary | ICD-10-CM | POA: Diagnosis not present

## 2020-11-20 DIAGNOSIS — I10 Essential (primary) hypertension: Secondary | ICD-10-CM | POA: Diagnosis not present

## 2020-11-20 DIAGNOSIS — N3001 Acute cystitis with hematuria: Secondary | ICD-10-CM | POA: Diagnosis not present

## 2020-11-27 DIAGNOSIS — N3001 Acute cystitis with hematuria: Secondary | ICD-10-CM | POA: Diagnosis not present

## 2020-11-27 DIAGNOSIS — K7290 Hepatic failure, unspecified without coma: Secondary | ICD-10-CM | POA: Diagnosis not present

## 2020-11-27 DIAGNOSIS — G9341 Metabolic encephalopathy: Secondary | ICD-10-CM | POA: Diagnosis not present

## 2020-11-27 DIAGNOSIS — E119 Type 2 diabetes mellitus without complications: Secondary | ICD-10-CM | POA: Diagnosis not present

## 2020-11-27 DIAGNOSIS — D696 Thrombocytopenia, unspecified: Secondary | ICD-10-CM | POA: Diagnosis not present

## 2020-11-27 DIAGNOSIS — E871 Hypo-osmolality and hyponatremia: Secondary | ICD-10-CM | POA: Diagnosis not present

## 2020-11-27 DIAGNOSIS — I10 Essential (primary) hypertension: Secondary | ICD-10-CM | POA: Diagnosis not present

## 2020-11-30 DIAGNOSIS — Z993 Dependence on wheelchair: Secondary | ICD-10-CM | POA: Diagnosis not present

## 2020-11-30 DIAGNOSIS — Z9181 History of falling: Secondary | ICD-10-CM | POA: Diagnosis not present

## 2020-11-30 DIAGNOSIS — M1990 Unspecified osteoarthritis, unspecified site: Secondary | ICD-10-CM | POA: Diagnosis not present

## 2020-11-30 DIAGNOSIS — D696 Thrombocytopenia, unspecified: Secondary | ICD-10-CM | POA: Diagnosis not present

## 2020-11-30 DIAGNOSIS — K7290 Hepatic failure, unspecified without coma: Secondary | ICD-10-CM | POA: Diagnosis not present

## 2020-11-30 DIAGNOSIS — I10 Essential (primary) hypertension: Secondary | ICD-10-CM | POA: Diagnosis not present

## 2020-11-30 DIAGNOSIS — E785 Hyperlipidemia, unspecified: Secondary | ICD-10-CM | POA: Diagnosis not present

## 2020-11-30 DIAGNOSIS — F419 Anxiety disorder, unspecified: Secondary | ICD-10-CM | POA: Diagnosis not present

## 2020-11-30 DIAGNOSIS — E871 Hypo-osmolality and hyponatremia: Secondary | ICD-10-CM | POA: Diagnosis not present

## 2020-11-30 DIAGNOSIS — K7469 Other cirrhosis of liver: Secondary | ICD-10-CM | POA: Diagnosis not present

## 2020-11-30 DIAGNOSIS — Z7984 Long term (current) use of oral hypoglycemic drugs: Secondary | ICD-10-CM | POA: Diagnosis not present

## 2020-11-30 DIAGNOSIS — J45998 Other asthma: Secondary | ICD-10-CM | POA: Diagnosis not present

## 2020-11-30 DIAGNOSIS — E041 Nontoxic single thyroid nodule: Secondary | ICD-10-CM | POA: Diagnosis not present

## 2020-11-30 DIAGNOSIS — K7581 Nonalcoholic steatohepatitis (NASH): Secondary | ICD-10-CM | POA: Diagnosis not present

## 2020-11-30 DIAGNOSIS — Z794 Long term (current) use of insulin: Secondary | ICD-10-CM | POA: Diagnosis not present

## 2020-11-30 DIAGNOSIS — Z7982 Long term (current) use of aspirin: Secondary | ICD-10-CM | POA: Diagnosis not present

## 2020-11-30 DIAGNOSIS — F32A Depression, unspecified: Secondary | ICD-10-CM | POA: Diagnosis not present

## 2020-11-30 DIAGNOSIS — R569 Unspecified convulsions: Secondary | ICD-10-CM | POA: Diagnosis not present

## 2020-11-30 DIAGNOSIS — E119 Type 2 diabetes mellitus without complications: Secondary | ICD-10-CM | POA: Diagnosis not present

## 2020-11-30 DIAGNOSIS — D61818 Other pancytopenia: Secondary | ICD-10-CM | POA: Diagnosis not present

## 2020-12-02 DIAGNOSIS — K7581 Nonalcoholic steatohepatitis (NASH): Secondary | ICD-10-CM | POA: Diagnosis not present

## 2020-12-02 DIAGNOSIS — K7290 Hepatic failure, unspecified without coma: Secondary | ICD-10-CM | POA: Diagnosis not present

## 2020-12-02 DIAGNOSIS — K7469 Other cirrhosis of liver: Secondary | ICD-10-CM | POA: Diagnosis not present

## 2020-12-02 DIAGNOSIS — E871 Hypo-osmolality and hyponatremia: Secondary | ICD-10-CM | POA: Diagnosis not present

## 2020-12-02 DIAGNOSIS — E119 Type 2 diabetes mellitus without complications: Secondary | ICD-10-CM | POA: Diagnosis not present

## 2020-12-02 DIAGNOSIS — D696 Thrombocytopenia, unspecified: Secondary | ICD-10-CM | POA: Diagnosis not present

## 2020-12-10 DIAGNOSIS — E871 Hypo-osmolality and hyponatremia: Secondary | ICD-10-CM | POA: Diagnosis not present

## 2020-12-10 DIAGNOSIS — D696 Thrombocytopenia, unspecified: Secondary | ICD-10-CM | POA: Diagnosis not present

## 2020-12-10 DIAGNOSIS — K7469 Other cirrhosis of liver: Secondary | ICD-10-CM | POA: Diagnosis not present

## 2020-12-10 DIAGNOSIS — K7581 Nonalcoholic steatohepatitis (NASH): Secondary | ICD-10-CM | POA: Diagnosis not present

## 2020-12-10 DIAGNOSIS — K7290 Hepatic failure, unspecified without coma: Secondary | ICD-10-CM | POA: Diagnosis not present

## 2020-12-10 DIAGNOSIS — E119 Type 2 diabetes mellitus without complications: Secondary | ICD-10-CM | POA: Diagnosis not present

## 2020-12-14 ENCOUNTER — Encounter (HOSPITAL_COMMUNITY): Payer: Self-pay | Admitting: Emergency Medicine

## 2020-12-14 ENCOUNTER — Emergency Department (HOSPITAL_COMMUNITY)
Admission: EM | Admit: 2020-12-14 | Discharge: 2020-12-14 | Disposition: A | Discharge status: Home / Self Care | Payer: Medicare Other | Attending: Emergency Medicine | Admitting: Emergency Medicine

## 2020-12-14 DIAGNOSIS — I10 Essential (primary) hypertension: Secondary | ICD-10-CM | POA: Insufficient documentation

## 2020-12-14 DIAGNOSIS — Z96651 Presence of right artificial knee joint: Secondary | ICD-10-CM | POA: Insufficient documentation

## 2020-12-14 DIAGNOSIS — Z794 Long term (current) use of insulin: Secondary | ICD-10-CM | POA: Insufficient documentation

## 2020-12-14 DIAGNOSIS — Z79899 Other long term (current) drug therapy: Secondary | ICD-10-CM | POA: Diagnosis not present

## 2020-12-14 DIAGNOSIS — Z7984 Long term (current) use of oral hypoglycemic drugs: Secondary | ICD-10-CM | POA: Insufficient documentation

## 2020-12-14 DIAGNOSIS — J45909 Unspecified asthma, uncomplicated: Secondary | ICD-10-CM | POA: Diagnosis not present

## 2020-12-14 DIAGNOSIS — D696 Thrombocytopenia, unspecified: Secondary | ICD-10-CM | POA: Diagnosis not present

## 2020-12-14 DIAGNOSIS — E1169 Type 2 diabetes mellitus with other specified complication: Secondary | ICD-10-CM | POA: Diagnosis not present

## 2020-12-14 DIAGNOSIS — Z8541 Personal history of malignant neoplasm of cervix uteri: Secondary | ICD-10-CM | POA: Diagnosis not present

## 2020-12-14 DIAGNOSIS — D72819 Decreased white blood cell count, unspecified: Secondary | ICD-10-CM | POA: Diagnosis present

## 2020-12-14 LAB — COMPREHENSIVE METABOLIC PANEL
ALT: 33 U/L (ref 0–44)
AST: 45 U/L — ABNORMAL HIGH (ref 15–41)
Albumin: 2.6 g/dL — ABNORMAL LOW (ref 3.5–5.0)
Alkaline Phosphatase: 73 U/L (ref 38–126)
Anion gap: 8 (ref 5–15)
BUN: 12 mg/dL (ref 8–23)
CO2: 23 mmol/L (ref 22–32)
Calcium: 8.8 mg/dL — ABNORMAL LOW (ref 8.9–10.3)
Chloride: 100 mmol/L (ref 98–111)
Creatinine, Ser: 1.06 mg/dL — ABNORMAL HIGH (ref 0.44–1.00)
GFR, Estimated: 56 mL/min — ABNORMAL LOW (ref >60)
Glucose, Bld: 193 mg/dL — ABNORMAL HIGH (ref 70–99)
Potassium: 4.5 mmol/L (ref 3.5–5.1)
Sodium: 131 mmol/L — ABNORMAL LOW (ref 135–145)
Total Bilirubin: 1.4 mg/dL — ABNORMAL HIGH (ref 0.3–1.2)
Total Protein: 5.6 g/dL — ABNORMAL LOW (ref 6.5–8.1)

## 2020-12-14 LAB — CBC WITH DIFFERENTIAL/PLATELET
Abs Immature Granulocytes: 0.04 10*3/uL (ref 0.00–0.07)
Basophils Absolute: 0.1 10*3/uL (ref 0.0–0.1)
Basophils Relative: 2 %
Eosinophils Absolute: 0.4 10*3/uL (ref 0.0–0.5)
Eosinophils Relative: 12 %
HCT: 36 % (ref 36.0–46.0)
Hemoglobin: 11.8 g/dL — ABNORMAL LOW (ref 12.0–15.0)
Immature Granulocytes: 1 %
Lymphocytes Relative: 15 %
Lymphs Abs: 0.5 10*3/uL — ABNORMAL LOW (ref 0.7–4.0)
MCH: 33.2 pg (ref 26.0–34.0)
MCHC: 32.8 g/dL (ref 30.0–36.0)
MCV: 101.4 fL — ABNORMAL HIGH (ref 80.0–100.0)
Monocytes Absolute: 0.3 10*3/uL (ref 0.1–1.0)
Monocytes Relative: 10 %
Neutro Abs: 2.1 10*3/uL (ref 1.7–7.7)
Neutrophils Relative %: 60 %
Platelets: 40 10*3/uL — ABNORMAL LOW (ref 150–400)
RBC: 3.55 MIL/uL — ABNORMAL LOW (ref 3.87–5.11)
RDW: 15.7 % — ABNORMAL HIGH (ref 11.5–15.5)
WBC: 3.5 10*3/uL — ABNORMAL LOW (ref 4.0–10.5)
nRBC: 0 % (ref 0.0–0.2)

## 2020-12-14 NOTE — ED Provider Notes (Signed)
Emergency Medicine Provider Triage Evaluation Note  Brittany Russo , a 75 y.o. female  was evaluated in triage.  Pt complains of low platelet count.  Patient is pancytopenic.  Was told a few days ago that her platelet count was in the 30s.  Provider also told her that they will not do a platelet transfusion her platelets are down in the 20s.  However family disagrees with this and would like basically a second opinion.  No active bleeding, she has had intermittent bleeding from her rectum but husband believes that is because of her constant diarrhea from her lactulose.  Only pain she is experiencing is pain at the site of her prior amputation as she has not taken her gabapentin this morning.  Review of Systems  Positive: Leg pain Negative: Bleeding  Physical Exam  BP (!) 137/56 (BP Location: Left Arm)   Pulse (!) 57   Temp 98.4 F (36.9 C) (Oral)   Resp 16   SpO2 100%  Gen:   Awake, no distress   Resp:  Normal effort  MSK:   Moves extremities without difficulty  Other:    Medical Decision Making  Medically screening exam initiated at 12:28 PM.  Appropriate orders placed.  Brittany Russo was informed that the remainder of the evaluation will be completed by another provider, this initial triage assessment does not replace that evaluation, and the importance of remaining in the ED until their evaluation is complete.  Will order lab work   Delia Heady, Hershal Coria 12/14/20 [place]    Fredia Sorrow, MD 12/23/20 1309

## 2020-12-14 NOTE — ED Provider Notes (Signed)
[facility] EMERGENCY DEPARTMENT Provider Note   CSN: [id number] Arrival date & time: 12/14/20  1210     History No chief complaint on file.   Brittany Russo is a 75 y.o. female.  Patient followed by Dr. Saunders Glance.  Which sounds like its [facility] [place].  Patient with admission at [facility] July 22 through July 29 for hyponatremia thrombocytopenia acute metabolic encephalopathy cirrhosis of the liver.  Family has been concerned about the low platelets when she was discharged on July 29 her platelet count was 23,000.  Family was told that it was 32,000 a few days ago states that another hospital would not give her platelets unless they go below 20 they want a second opinion.  Patient's also had a right AKA.  Patient has not had any bleeding problems.      Past Medical History:  Diagnosis Date   Acute and subacute hepatic failure without coma    Allergy    Anasarca    Anxiety    Arthritis    Ascites 09/2018   no SBP on 850 cc tap 10/13/18   Asthma    allery induced   Cirrhosis of liver ([place]) ~ 2004   from NAFLD.  Dx ~ 2004.  Dr Dorcas Mcmurray at [facility]/     Depression    Diabetes mellitus without complication [facility])    type 2   Eczema of both hands    Endometrial cancer ([place]) 1996, 2003   hysterectomy 1996.  recurrence 2003, treated with radiation.     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, DR Allyson Sabal Mir at [place] in [place]   Hyperlipidemia    Hypertension    Muscle weakness    Seizures ([place])    last seizure June 07, 2014    Thrombocytopenia [facility])    Thyroid nodule     Patient Active Problem List   Diagnosis Date Noted   Acute metabolic encephalopathy 94/17/4081   SBO (small bowel obstruction) ([place]) 01/08/2020   Hyperammonemia ([place]) 09/07/2019   Transaminitis 09/07/2019   Lactic acidosis 09/07/2019   Elevated INR 09/07/2019   Abdominal pain 09/07/2019   Radiation  proctitis 07/28/2019   Status post thoracentesis    Acute on chronic anemia    Pleural effusion    Supratherapeutic INR    Urinary retention    Radiation cystitis    Labile blood pressure    Labile blood glucose    Anxiety state    Controlled type 2 diabetes mellitus with hyperglycemia, with long-term current use of insulin (HCC)    Hyponatremia    Thrombocytopenia (HCC)    Phantom limb pain (HCC)    Postoperative pain    Amputation above knee ([place]) 07/04/2019   S/P right knee disarticulation amputation 06/29/2019   Above knee amputation of right lower extremity ([place]) 06/29/2019   Hyperglycemia    Failed total right knee replacement ([place])    Hepatic encephalopathy ([place]) 12/28/2018   UTI (urinary tract infection) 12/28/2018   Bradycardia 12/28/2018   Hyperkalemia 12/28/2018   Overweight (BMI 25.0-29.9) 11/23/2018   Mechanical problem with extremity 11/22/2018   Cirrhosis of liver with ascites ([place]) 11/09/2018   Nausea without vomiting 10/26/2018   Ascites    Rectal bleeding    AKI (acute kidney injury) ([place]) 10/11/2018   Lower GI bleed 10/10/2018   Anasarca 10/10/2018   Hypoalbuminemia 10/10/2018   Depression 09/28/2018  Acute hepatic encephalopathy 09/28/2018   Cirrhosis ([place]) 09/28/2018   Periprosthetic fracture around internal prosthetic right knee joint 09/24/2018   S/P right TKA 09/22/2018   Status post total knee replacement, right 09/22/2018   DM2 (diabetes mellitus, type 2) ([place]) 07/11/2015   Hypertension 07/11/2015   Hyperlipidemia 07/11/2015    Past Surgical History:  Procedure Laterality Date   AMPUTATION Right 06/29/2019   Procedure: AMPUTATION ABOVE KNEE through knee;  Surgeon: Paralee Cancel, MD;  Location: [facility];  Service: Orthopedics;  Laterality: Right;  2 hrs   CATARACT EXTRACTION W/PHACO Left 06/04/2017   Procedure: CATARACT EXTRACTION PHACO AND INTRAOCULAR LENS PLACEMENT (IOC);  Surgeon: Baruch Goldmann, MD;  Location: [facility];  Service: Ophthalmology;   Laterality: Left;  CDE: 3.90   CATARACT EXTRACTION W/PHACO Right 07/15/2017   Procedure: CATARACT EXTRACTION PHACO AND INTRAOCULAR LENS PLACEMENT (IOC);  Surgeon: Baruch Goldmann, MD;  Location: [facility];  Service: Ophthalmology;  Laterality: Right;  CDE: 7.60   COLONOSCOPY  06/2007   in [place]. for hematochzia.  radiation proctitis   DILATION AND CURETTAGE OF UTERUS     ESOPHAGOGASTRODUODENOSCOPY  2016   ESOPHAGOGASTRODUODENOSCOPY (EGD) WITH PROPOFOL N/A 12/30/2018   Procedure: EGD;  Surgeon: Clarene Essex, MD;  Location: [facility];  Service: Endoscopy;  Laterality: N/A;   EXCISIONAL TOTAL KNEE ARTHROPLASTY Right 01/03/2019   Procedure: EXCISIONAL TOTAL KNEE ARTHROPLASTY;  Surgeon: Paralee Cancel, MD;  Location: [facility];  Service: Orthopedics;  Laterality: Right;   FLEXIBLE SIGMOIDOSCOPY N/A 12/30/2018   Procedure: FLEXIBLE SIGMOIDOSCOPY;  Surgeon: Clarene Essex, MD;  Location: [facility];  Service: Endoscopy;  Laterality: N/A;   IR PARACENTESIS  10/13/2018   IR PARACENTESIS  07/21/2019   IR THORACENTESIS ASP PLEURAL SPACE W/IMG GUIDE  07/19/2019   STERIOD INJECTION Left 06/29/2019   Procedure: LEFT KNEE INJECTION;  Surgeon: Paralee Cancel, MD;  Location: [facility];  Service: Orthopedics;  Laterality: Left;   TOTAL ABDOMINAL HYSTERECTOMY  1996   TOTAL KNEE ARTHROPLASTY Right 09/22/2018   Procedure: TOTAL KNEE ARTHROPLASTY;  Surgeon: Paralee Cancel, MD;  [facility]: [facility];  Service: Orthopedics;  Laterality: Right;  70 mins   TOTAL KNEE REVISION Right 09/27/2018   Procedure: TOTAL KNEE REVISION;  Surgeon: Paralee Cancel, MD;  Location: [facility];  Service: Orthopedics;  Laterality: Right;   TOTAL KNEE REVISION Right 11/22/2018   Procedure: repair right knee extensor mechanism;  Surgeon: Paralee Cancel, MD;  Location: [facility];  Service: Orthopedics;  Laterality: Right;  90 mins     OB History   No obstetric history on file.     Family History  Problem Relation Age of Onset   Heart disease Mother    Diabetes  Mother    Heart disease Father    Cancer Father    Cancer Maternal Grandmother    Heart disease Maternal Grandfather     Social History   Tobacco Use   Smoking status: Never   Smokeless tobacco: Never  Vaping Use   Vaping Use: Never used  Substance Use Topics   Alcohol use: No   Drug use: No    Home Medications Prior to Admission medications   Medication Sig Start Date End Date Taking? Authorizing Provider  acetaminophen (TYLENOL) 325 MG tablet Take 1-2 tablets (325-650 mg total) by mouth every 4 (four) hours as needed for mild pain. 07/13/19   Love, Ivan Anchors, PA-C  ALPRAZolam (XANAX) 0.25 MG tablet Take 1 tablet (0.25 mg total) by mouth 2 (two) times daily. 01/06/19  Mariel Aloe, MD  atenolol (TENORMIN) 25 MG tablet Take 1 tablet (25 mg total) by mouth daily. 10/18/20   Roxan Hockey, MD  atorvastatin (LIPITOR) 80 MG tablet Take 80 mg by mouth daily at 8 pm.    [provider]  benzonatate (TESSALON) 100 MG capsule Take 1 capsule (100 mg total) by mouth 3 (three) times daily as needed for cough. 10/18/20   Roxan Hockey, MD  buPROPion (WELLBUTRIN XL) 150 MG 24 hr tablet Take 150 mg by mouth in the morning.     [provider]  escitalopram (LEXAPRO) 20 MG tablet Take 20 mg by mouth in the morning.  05/17/19   [provider]  ferrous sulfate (FERROUSUL) 325 (65 FE) MG tablet Take 1 tablet (325 mg total) by mouth 2 (two) times daily with a meal. 10/18/20 11/17/20  Emokpae, Courage, MD  fluticasone (FLONASE) 50 MCG/ACT nasal spray Place 1 spray into both nostrils in the morning.    [provider]  furosemide (LASIX) 20 MG tablet Take 1 tablet (20 mg total) by mouth daily. 10/18/20   Roxan Hockey, MD  gabapentin (NEURONTIN) 300 MG capsule Take 1 capsule (300 mg total) by mouth 2 (two) times daily as needed (for nerve pain). 10/18/20   Emokpae, Courage, MD  insulin aspart (NOVOLOG FLEXPEN) 100 UNIT/ML FlexPen Inject 4 Units into the skin in  the morning and at bedtime. Can be up to 8 units twice a day depending on blood sugar. Take mid afternoon only if bloodsugar  is over 180  Takes in evening if it is 200 or over give up to 10 units per daughter    [provider]  Insulin Glargine (BASAGLAR KWIKPEN) 100 UNIT/ML Inject 25 Units into the skin See admin instructions. Inject 25 units if sugar is mid 100's.    [provider]  lactulose (CHRONULAC) 10 GM/15ML solution Take 45 mLs (30 g total) by mouth daily. 10/18/20   Roxan Hockey, MD  loratadine (CLARITIN) 10 MG tablet Take 10 mg by mouth in the morning.     [provider]  mesalamine (CANASA) 1000 MG suppository Place 1 suppository rectally at bedtime.    [provider]  Multiple Vitamin (MULTIVITAMIN WITH MINERALS) TABS tablet Take 1 tablet by mouth daily. 12/06/18   Danae Orleans, PA-C  pantoprazole (PROTONIX) 40 MG tablet Take 1 tablet (40 mg total) by mouth daily. 09/08/19   Orson Eva, MD  rifaximin (XIFAXAN) 550 MG TABS tablet Take 550 mg by mouth 2 (two) times daily.    [provider]  sitaGLIPtin-metformin (JANUMET) 50-500 MG per tablet Take 1 tablet by mouth 2 (two) times daily with a meal.    [provider]  spironolactone (ALDACTONE) 100 MG tablet Take 1 tablet (100 mg total) by mouth in the morning. 10/18/20   Roxan Hockey, MD  traZODone (DESYREL) 50 MG tablet Take 50 mg by mouth at bedtime as needed for sleep.  05/17/19   [provider]  vitamin B-12 (CYANOCOBALAMIN) 500 MCG tablet Take 1 tablet (500 mcg total) by mouth daily. 09/08/19   Orson Eva, MD  Ibuprofen-Diphenhydramine Cit (ADVIL PM PO) Take 60 mg by mouth at bedtime.  05/21/17  [provider]    Allergies    Asa [aspirin] and Other  Review of Systems   Review of Systems  Constitutional:  Negative for chills and fever.  HENT:  Negative for ear pain and sore throat.   Eyes:  Negative for pain and  visual disturbance.  Respiratory:   Negative for cough and shortness of breath.   Cardiovascular:  Negative for chest pain and palpitations.  Gastrointestinal:  Negative for abdominal pain and vomiting.  Genitourinary:  Negative for dysuria and hematuria.  Musculoskeletal:  Negative for arthralgias and back pain.  Skin:  Negative for color change and rash.  Neurological:  Negative for seizures and syncope.  All other systems reviewed and are negative.  Physical Exam Updated Vital Signs BP (!) 132/57   Pulse 62   Temp 98.2 F (36.8 C) (Oral)   Resp 16   SpO2 100%   Physical Exam Vitals and nursing note reviewed.  Constitutional:      General: She is not in acute distress.    Appearance: Normal appearance. She is well-developed.  HENT:     Head: Normocephalic and atraumatic.  Eyes:     Extraocular Movements: Extraocular movements intact.     Conjunctiva/sclera: Conjunctivae normal.     Pupils: Pupils are equal, round, and reactive to light.  Cardiovascular:     Rate and Rhythm: Normal rate and regular rhythm.     Heart sounds: No murmur heard. Pulmonary:     Effort: Pulmonary effort is normal. No respiratory distress.     Breath sounds: Normal breath sounds.  Abdominal:     General: There is no distension.     Palpations: Abdomen is soft.     Tenderness: There is no abdominal tenderness. There is no guarding.  Musculoskeletal:     Cervical back: Normal range of motion and neck supple.     Comments: Right above-the-knee amputation.  Left leg without any swelling.  Skin:    General: Skin is warm and dry.  Neurological:     General: No focal deficit present.     Mental Status: She is alert and oriented to person, place, and time.    ED Results / Procedures / Treatments   Labs (all labs ordered are listed, but only abnormal results are displayed) Labs Reviewed  COMPREHENSIVE METABOLIC PANEL - Abnormal; Notable for the following components:      Result Value   Sodium 131 (*)    Glucose, Bld 193 (*)     Creatinine, Ser 1.06 (*)    Calcium 8.8 (*)    Total Protein 5.6 (*)    Albumin 2.6 (*)    AST 45 (*)    Total Bilirubin 1.4 (*)    GFR, Estimated 56 (*)    All other components within normal limits  CBC WITH DIFFERENTIAL/PLATELET - Abnormal; Notable for the following components:   WBC 3.5 (*)    RBC 3.55 (*)    Hemoglobin 11.8 (*)    MCV 101.4 (*)    RDW 15.7 (*)    Platelets 40 (*)    Lymphs Abs 0.5 (*)    All other components within normal limits    EKG None  Radiology No results found.  Procedures Procedures   Medications Ordered in ED Medications - No data to display  ED Course  I have reviewed the triage vital signs and the nursing notes.  Pertinent labs & imaging results that were available during my care of the patient were reviewed by me and considered in my medical decision making (see chart for details).    MDM Rules/Calculators/A&P                           Patient's labs here today hemoglobin  is 11.8 which is up from where it was.  Her platelets are 40,000.  Which is showing improvement.  Total bili today 1.4, baseline sodium is actually improved today at 131.  Patient's had trouble with hyponatremia.  Patient without any active bleeding.  Reconfirmed that platelet transfusions are not done unless platelets get below 20,000 because that is when the risk of bleeding occurs.  Above that is adequate amount of platelets.  However she needs close follow-up with her primary care doctor. Final Clinical Impression(s) / ED Diagnoses Final diagnoses:  Thrombocytopenia [facility])    Rx / DC Orders ED Discharge Orders     None        Fredia Sorrow, MD 12/14/20 2001

## 2020-12-14 NOTE — ED Triage Notes (Signed)
Family concerned that pt's platelets were 53 a few days ago and states that another hospital wouldn't give her platelets unless they got to 20 or below.  Family is requesting another opinion.  Pt reports  chronic R amputee- phantom pain.

## 2020-12-14 NOTE — ED Notes (Signed)
Pt family updated that pt is up for discharge, family reports they will be an hour to get the patient.

## 2020-12-14 NOTE — Discharge Instructions (Signed)
The low platelets are probably related to the liver cirrhosis.  Close follow-up with your primary care doctor is important.  Today's platelets are 40,000.  Which is an improvement.  They will not transfuse platelets unless she get below 20,000 because that is when the bleeding risk occurs.  Above that there is no significant bleeding risk.  Also your sodium today is 131 which is an improvement.

## 2020-12-16 DIAGNOSIS — E079 Disorder of thyroid, unspecified: Secondary | ICD-10-CM | POA: Diagnosis not present

## 2020-12-16 DIAGNOSIS — K7581 Nonalcoholic steatohepatitis (NASH): Secondary | ICD-10-CM | POA: Diagnosis not present

## 2020-12-16 DIAGNOSIS — K7469 Other cirrhosis of liver: Secondary | ICD-10-CM | POA: Diagnosis not present

## 2020-12-16 DIAGNOSIS — E119 Type 2 diabetes mellitus without complications: Secondary | ICD-10-CM | POA: Diagnosis not present

## 2020-12-16 DIAGNOSIS — E871 Hypo-osmolality and hyponatremia: Secondary | ICD-10-CM | POA: Diagnosis not present

## 2020-12-16 DIAGNOSIS — D696 Thrombocytopenia, unspecified: Secondary | ICD-10-CM | POA: Diagnosis not present

## 2020-12-16 DIAGNOSIS — K7290 Hepatic failure, unspecified without coma: Secondary | ICD-10-CM | POA: Diagnosis not present

## 2020-12-20 DIAGNOSIS — Z20828 Contact with and (suspected) exposure to other viral communicable diseases: Secondary | ICD-10-CM | POA: Diagnosis not present

## 2020-12-20 DIAGNOSIS — J019 Acute sinusitis, unspecified: Secondary | ICD-10-CM | POA: Diagnosis not present

## 2020-12-20 DIAGNOSIS — J209 Acute bronchitis, unspecified: Secondary | ICD-10-CM | POA: Diagnosis not present

## 2020-12-23 DIAGNOSIS — K7290 Hepatic failure, unspecified without coma: Secondary | ICD-10-CM | POA: Diagnosis not present

## 2020-12-23 DIAGNOSIS — E871 Hypo-osmolality and hyponatremia: Secondary | ICD-10-CM | POA: Diagnosis not present

## 2020-12-23 DIAGNOSIS — D696 Thrombocytopenia, unspecified: Secondary | ICD-10-CM | POA: Diagnosis not present

## 2020-12-23 DIAGNOSIS — K7581 Nonalcoholic steatohepatitis (NASH): Secondary | ICD-10-CM | POA: Diagnosis not present

## 2020-12-23 DIAGNOSIS — D649 Anemia, unspecified: Secondary | ICD-10-CM | POA: Diagnosis not present

## 2020-12-23 DIAGNOSIS — K7469 Other cirrhosis of liver: Secondary | ICD-10-CM | POA: Diagnosis not present

## 2020-12-23 DIAGNOSIS — E119 Type 2 diabetes mellitus without complications: Secondary | ICD-10-CM | POA: Diagnosis not present

## 2020-12-30 DIAGNOSIS — K7290 Hepatic failure, unspecified without coma: Secondary | ICD-10-CM | POA: Diagnosis not present

## 2020-12-30 DIAGNOSIS — R569 Unspecified convulsions: Secondary | ICD-10-CM | POA: Diagnosis not present

## 2020-12-30 DIAGNOSIS — E871 Hypo-osmolality and hyponatremia: Secondary | ICD-10-CM | POA: Diagnosis not present

## 2020-12-30 DIAGNOSIS — E119 Type 2 diabetes mellitus without complications: Secondary | ICD-10-CM | POA: Diagnosis not present

## 2020-12-30 DIAGNOSIS — Z794 Long term (current) use of insulin: Secondary | ICD-10-CM | POA: Diagnosis not present

## 2020-12-30 DIAGNOSIS — M1990 Unspecified osteoarthritis, unspecified site: Secondary | ICD-10-CM | POA: Diagnosis not present

## 2020-12-30 DIAGNOSIS — F32A Depression, unspecified: Secondary | ICD-10-CM | POA: Diagnosis not present

## 2020-12-30 DIAGNOSIS — Z7982 Long term (current) use of aspirin: Secondary | ICD-10-CM | POA: Diagnosis not present

## 2020-12-30 DIAGNOSIS — E785 Hyperlipidemia, unspecified: Secondary | ICD-10-CM | POA: Diagnosis not present

## 2020-12-30 DIAGNOSIS — K7581 Nonalcoholic steatohepatitis (NASH): Secondary | ICD-10-CM | POA: Diagnosis not present

## 2020-12-30 DIAGNOSIS — I10 Essential (primary) hypertension: Secondary | ICD-10-CM | POA: Diagnosis not present

## 2020-12-30 DIAGNOSIS — Z7984 Long term (current) use of oral hypoglycemic drugs: Secondary | ICD-10-CM | POA: Diagnosis not present

## 2020-12-30 DIAGNOSIS — F419 Anxiety disorder, unspecified: Secondary | ICD-10-CM | POA: Diagnosis not present

## 2020-12-30 DIAGNOSIS — E041 Nontoxic single thyroid nodule: Secondary | ICD-10-CM | POA: Diagnosis not present

## 2020-12-30 DIAGNOSIS — D696 Thrombocytopenia, unspecified: Secondary | ICD-10-CM | POA: Diagnosis not present

## 2020-12-30 DIAGNOSIS — Z993 Dependence on wheelchair: Secondary | ICD-10-CM | POA: Diagnosis not present

## 2020-12-30 DIAGNOSIS — K7469 Other cirrhosis of liver: Secondary | ICD-10-CM | POA: Diagnosis not present

## 2020-12-30 DIAGNOSIS — Z9181 History of falling: Secondary | ICD-10-CM | POA: Diagnosis not present

## 2020-12-30 DIAGNOSIS — J45998 Other asthma: Secondary | ICD-10-CM | POA: Diagnosis not present

## 2020-12-30 DIAGNOSIS — D61818 Other pancytopenia: Secondary | ICD-10-CM | POA: Diagnosis not present

## 2021-01-08 DIAGNOSIS — K7469 Other cirrhosis of liver: Secondary | ICD-10-CM | POA: Diagnosis not present

## 2021-01-08 DIAGNOSIS — D696 Thrombocytopenia, unspecified: Secondary | ICD-10-CM | POA: Diagnosis not present

## 2021-01-08 DIAGNOSIS — E871 Hypo-osmolality and hyponatremia: Secondary | ICD-10-CM | POA: Diagnosis not present

## 2021-01-08 DIAGNOSIS — K7290 Hepatic failure, unspecified without coma: Secondary | ICD-10-CM | POA: Diagnosis not present

## 2021-01-08 DIAGNOSIS — E119 Type 2 diabetes mellitus without complications: Secondary | ICD-10-CM | POA: Diagnosis not present

## 2021-01-08 DIAGNOSIS — K7581 Nonalcoholic steatohepatitis (NASH): Secondary | ICD-10-CM | POA: Diagnosis not present

## 2021-01-17 DIAGNOSIS — K7460 Unspecified cirrhosis of liver: Secondary | ICD-10-CM | POA: Diagnosis not present

## 2021-01-21 DIAGNOSIS — R300 Dysuria: Secondary | ICD-10-CM | POA: Diagnosis not present

## 2021-01-29 DIAGNOSIS — G546 Phantom limb syndrome with pain: Secondary | ICD-10-CM | POA: Diagnosis not present

## 2021-01-29 DIAGNOSIS — E7849 Other hyperlipidemia: Secondary | ICD-10-CM | POA: Diagnosis not present

## 2021-01-29 DIAGNOSIS — F419 Anxiety disorder, unspecified: Secondary | ICD-10-CM | POA: Diagnosis not present

## 2021-01-29 DIAGNOSIS — D649 Anemia, unspecified: Secondary | ICD-10-CM | POA: Diagnosis not present

## 2021-01-29 DIAGNOSIS — F32A Depression, unspecified: Secondary | ICD-10-CM | POA: Diagnosis not present

## 2021-01-29 DIAGNOSIS — Z0001 Encounter for general adult medical examination with abnormal findings: Secondary | ICD-10-CM | POA: Diagnosis not present

## 2021-01-29 DIAGNOSIS — I10 Essential (primary) hypertension: Secondary | ICD-10-CM | POA: Diagnosis not present

## 2021-01-29 DIAGNOSIS — E1165 Type 2 diabetes mellitus with hyperglycemia: Secondary | ICD-10-CM | POA: Diagnosis not present

## 2021-02-07 DIAGNOSIS — Z23 Encounter for immunization: Secondary | ICD-10-CM | POA: Diagnosis not present

## 2021-02-07 DIAGNOSIS — K7469 Other cirrhosis of liver: Secondary | ICD-10-CM | POA: Diagnosis not present

## 2021-02-11 DIAGNOSIS — R300 Dysuria: Secondary | ICD-10-CM | POA: Diagnosis not present

## 2021-03-03 DIAGNOSIS — E119 Type 2 diabetes mellitus without complications: Secondary | ICD-10-CM | POA: Diagnosis not present

## 2021-03-03 DIAGNOSIS — K7581 Nonalcoholic steatohepatitis (NASH): Secondary | ICD-10-CM | POA: Diagnosis not present

## 2021-03-03 DIAGNOSIS — R197 Diarrhea, unspecified: Secondary | ICD-10-CM | POA: Diagnosis not present

## 2021-03-03 DIAGNOSIS — K7460 Unspecified cirrhosis of liver: Secondary | ICD-10-CM | POA: Diagnosis not present

## 2021-03-03 DIAGNOSIS — R079 Chest pain, unspecified: Secondary | ICD-10-CM | POA: Diagnosis not present

## 2021-03-03 DIAGNOSIS — K766 Portal hypertension: Secondary | ICD-10-CM | POA: Diagnosis not present

## 2021-03-03 DIAGNOSIS — I700 Atherosclerosis of aorta: Secondary | ICD-10-CM | POA: Diagnosis not present

## 2021-03-03 DIAGNOSIS — M1991 Primary osteoarthritis, unspecified site: Secondary | ICD-10-CM | POA: Diagnosis not present

## 2021-03-03 DIAGNOSIS — R161 Splenomegaly, not elsewhere classified: Secondary | ICD-10-CM | POA: Diagnosis not present

## 2021-03-03 DIAGNOSIS — R0789 Other chest pain: Secondary | ICD-10-CM | POA: Diagnosis not present

## 2021-03-03 DIAGNOSIS — K769 Liver disease, unspecified: Secondary | ICD-10-CM | POA: Diagnosis not present

## 2021-03-03 DIAGNOSIS — R1110 Vomiting, unspecified: Secondary | ICD-10-CM | POA: Diagnosis not present

## 2021-03-03 DIAGNOSIS — R52 Pain, unspecified: Secondary | ICD-10-CM | POA: Diagnosis not present

## 2021-03-03 DIAGNOSIS — R188 Other ascites: Secondary | ICD-10-CM | POA: Diagnosis not present

## 2021-03-03 DIAGNOSIS — J189 Pneumonia, unspecified organism: Secondary | ICD-10-CM | POA: Diagnosis not present

## 2021-03-03 DIAGNOSIS — I8390 Asymptomatic varicose veins of unspecified lower extremity: Secondary | ICD-10-CM | POA: Diagnosis not present

## 2021-03-03 DIAGNOSIS — Z90710 Acquired absence of both cervix and uterus: Secondary | ICD-10-CM | POA: Diagnosis not present

## 2021-03-03 DIAGNOSIS — K7469 Other cirrhosis of liver: Secondary | ICD-10-CM | POA: Diagnosis not present

## 2021-03-03 DIAGNOSIS — J9811 Atelectasis: Secondary | ICD-10-CM | POA: Diagnosis not present

## 2021-03-03 DIAGNOSIS — N3000 Acute cystitis without hematuria: Secondary | ICD-10-CM | POA: Diagnosis not present

## 2021-03-03 DIAGNOSIS — J90 Pleural effusion, not elsewhere classified: Secondary | ICD-10-CM | POA: Diagnosis not present

## 2021-03-03 DIAGNOSIS — I10 Essential (primary) hypertension: Secondary | ICD-10-CM | POA: Diagnosis not present

## 2021-03-03 DIAGNOSIS — R9431 Abnormal electrocardiogram [ECG] [EKG]: Secondary | ICD-10-CM | POA: Diagnosis not present

## 2021-03-03 DIAGNOSIS — Z859 Personal history of malignant neoplasm, unspecified: Secondary | ICD-10-CM | POA: Diagnosis not present

## 2021-03-03 DIAGNOSIS — R300 Dysuria: Secondary | ICD-10-CM | POA: Diagnosis not present

## 2021-03-03 DIAGNOSIS — Z89611 Acquired absence of right leg above knee: Secondary | ICD-10-CM | POA: Diagnosis not present

## 2021-03-03 DIAGNOSIS — Z96651 Presence of right artificial knee joint: Secondary | ICD-10-CM | POA: Diagnosis not present

## 2021-03-05 ENCOUNTER — Other Ambulatory Visit: Payer: Self-pay

## 2021-03-05 ENCOUNTER — Emergency Department (HOSPITAL_COMMUNITY): Payer: Medicare Other

## 2021-03-05 ENCOUNTER — Emergency Department (HOSPITAL_COMMUNITY)
Admission: EM | Admit: 2021-03-05 | Discharge: 2021-03-05 | Disposition: A | Discharge status: Home / Self Care | Payer: Medicare Other | Attending: Emergency Medicine | Admitting: Emergency Medicine

## 2021-03-05 ENCOUNTER — Encounter (HOSPITAL_COMMUNITY): Payer: Self-pay | Admitting: *Deleted

## 2021-03-05 DIAGNOSIS — I700 Atherosclerosis of aorta: Secondary | ICD-10-CM | POA: Insufficient documentation

## 2021-03-05 DIAGNOSIS — I10 Essential (primary) hypertension: Secondary | ICD-10-CM | POA: Insufficient documentation

## 2021-03-05 DIAGNOSIS — Z48813 Encounter for surgical aftercare following surgery on the respiratory system: Secondary | ICD-10-CM | POA: Diagnosis not present

## 2021-03-05 DIAGNOSIS — Z9889 Other specified postprocedural states: Secondary | ICD-10-CM

## 2021-03-05 DIAGNOSIS — K769 Liver disease, unspecified: Secondary | ICD-10-CM | POA: Insufficient documentation

## 2021-03-05 DIAGNOSIS — J9819 Other pulmonary collapse: Secondary | ICD-10-CM | POA: Insufficient documentation

## 2021-03-05 DIAGNOSIS — K7460 Unspecified cirrhosis of liver: Secondary | ICD-10-CM | POA: Insufficient documentation

## 2021-03-05 DIAGNOSIS — E119 Type 2 diabetes mellitus without complications: Secondary | ICD-10-CM | POA: Insufficient documentation

## 2021-03-05 DIAGNOSIS — J90 Pleural effusion, not elsewhere classified: Secondary | ICD-10-CM | POA: Diagnosis not present

## 2021-03-05 DIAGNOSIS — J811 Chronic pulmonary edema: Secondary | ICD-10-CM | POA: Diagnosis not present

## 2021-03-05 DIAGNOSIS — R0600 Dyspnea, unspecified: Secondary | ICD-10-CM | POA: Diagnosis present

## 2021-03-05 DIAGNOSIS — J9811 Atelectasis: Secondary | ICD-10-CM | POA: Diagnosis not present

## 2021-03-05 DIAGNOSIS — J918 Pleural effusion in other conditions classified elsewhere: Secondary | ICD-10-CM | POA: Diagnosis not present

## 2021-03-05 LAB — BODY FLUID CELL COUNT WITH DIFFERENTIAL
Lymphs, Fluid: 15 %
Monocyte-Macrophage-Serous Fluid: 55 % (ref 50–90)
Neutrophil Count, Fluid: 30 % — ABNORMAL HIGH (ref 0–25)
Total Nucleated Cell Count, Fluid: 168 cu mm (ref 0–1000)

## 2021-03-05 LAB — CBC WITH DIFFERENTIAL/PLATELET
Abs Immature Granulocytes: 0.04 10*3/uL (ref 0.00–0.07)
Basophils Absolute: 0.1 10*3/uL (ref 0.0–0.1)
Basophils Relative: 1 %
Eosinophils Absolute: 0.2 10*3/uL (ref 0.0–0.5)
Eosinophils Relative: 3 %
HCT: 38.2 % (ref 36.0–46.0)
Hemoglobin: 12.1 g/dL (ref 12.0–15.0)
Immature Granulocytes: 1 %
Lymphocytes Relative: 12 %
Lymphs Abs: 0.7 10*3/uL (ref 0.7–4.0)
MCH: 32.5 pg (ref 26.0–34.0)
MCHC: 31.7 g/dL (ref 30.0–36.0)
MCV: 102.7 fL — ABNORMAL HIGH (ref 80.0–100.0)
Monocytes Absolute: 0.5 10*3/uL (ref 0.1–1.0)
Monocytes Relative: 9 %
Neutro Abs: 4.1 10*3/uL (ref 1.7–7.7)
Neutrophils Relative %: 74 %
Platelets: 65 10*3/uL — ABNORMAL LOW (ref 150–400)
RBC: 3.72 MIL/uL — ABNORMAL LOW (ref 3.87–5.11)
RDW: 16.7 % — ABNORMAL HIGH (ref 11.5–15.5)
WBC: 5.6 10*3/uL (ref 4.0–10.5)
nRBC: 0 % (ref 0.0–0.2)

## 2021-03-05 LAB — COMPREHENSIVE METABOLIC PANEL
ALT: 50 U/L — ABNORMAL HIGH (ref 0–44)
AST: 52 U/L — ABNORMAL HIGH (ref 15–41)
Albumin: 2.7 g/dL — ABNORMAL LOW (ref 3.5–5.0)
Alkaline Phosphatase: 104 U/L (ref 38–126)
Anion gap: 10 (ref 5–15)
BUN: 18 mg/dL (ref 8–23)
CO2: 24 mmol/L (ref 22–32)
Calcium: 8.4 mg/dL — ABNORMAL LOW (ref 8.9–10.3)
Chloride: 100 mmol/L (ref 98–111)
Creatinine, Ser: 0.99 mg/dL (ref 0.44–1.00)
GFR, Estimated: >60 mL/min (ref >60)
Glucose, Bld: 215 mg/dL — ABNORMAL HIGH (ref 70–99)
Potassium: 4.3 mmol/L (ref 3.5–5.1)
Sodium: 134 mmol/L — ABNORMAL LOW (ref 135–145)
Total Bilirubin: 2 mg/dL — ABNORMAL HIGH (ref 0.3–1.2)
Total Protein: 6.4 g/dL — ABNORMAL LOW (ref 6.5–8.1)

## 2021-03-05 LAB — LACTATE DEHYDROGENASE, PLEURAL OR PERITONEAL FLUID: LD, Fluid: 35 U/L — ABNORMAL HIGH (ref 3–23)

## 2021-03-05 LAB — AMMONIA: Ammonia: 26 umol/L (ref 9–35)

## 2021-03-05 LAB — BRAIN NATRIURETIC PEPTIDE: B Natriuretic Peptide: 73 pg/mL (ref 0.0–100.0)

## 2021-03-05 NOTE — ED Notes (Signed)
Pt to radiology for thoracentesis

## 2021-03-05 NOTE — ED Triage Notes (Signed)
Pt was told that she had fluid on her lungs on Monday at [facility].  [facility] sent pt here. Denies any SOB at present.  +cough NP at times.

## 2021-03-05 NOTE — ED Provider Notes (Signed)
[facility] Provider Note   CSN: [id number] Arrival date & time: 03/05/21  1233     History No chief complaint on file.   Brittany Russo is a 75 y.o. female.  HPI Patient presents with her husband assists with history. Patient has multiple medical issues including cirrhosis, diabetes, and now presents with concern for fatigue, ongoing cough.  She was seen, evaluated at [facility] 2 days ago.  She notes that she was informed of excessive fluid on her lungs.  She followed up with an x-ray with her physician, and was encouraged to come here for evaluation today.  No fever, syncope, fall.  No recent medication change, diet change, activity change.  She has dyspnea with coughing, but otherwise no true discomfort. She has received thoracentesis in the past, though it has been years.    Past Medical History:  Diagnosis Date   Acute and subacute hepatic failure without coma    Allergy    Anasarca    Anxiety    Arthritis    Ascites 09/2018   no SBP on 850 cc tap 10/13/18   Asthma    allery induced   Cirrhosis of liver ([place]) ~ 2004   from NAFLD.  Dx ~ 2004.  Dr Dorcas Mcmurray at [facility]/     Depression    Diabetes mellitus without complication [facility])    type 2   Eczema of both hands    Endometrial cancer ([place]) 1996, 2003   hysterectomy 1996.  recurrence 2003, treated with radiation.     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, DR Allyson Sabal Mir at [place] in [place]   Hyperlipidemia    Hypertension    Muscle weakness    Seizures ([place])    last seizure June 07, 2014    Thrombocytopenia [facility])    Thyroid nodule     Patient Active Problem List   Diagnosis Date Noted   Acute metabolic encephalopathy 53/97/6734   SBO (small bowel obstruction) ([place]) 01/08/2020   Hyperammonemia ([place]) 09/07/2019   Transaminitis 09/07/2019   Lactic acidosis 09/07/2019   Elevated INR 09/07/2019   Abdominal  pain 09/07/2019   Radiation proctitis 07/28/2019   Status post thoracentesis    Acute on chronic anemia    Pleural effusion    Supratherapeutic INR    Urinary retention    Radiation cystitis    Labile blood pressure    Labile blood glucose    Anxiety state    Controlled type 2 diabetes mellitus with hyperglycemia, with long-term current use of insulin (HCC)    Hyponatremia    Thrombocytopenia (HCC)    Phantom limb pain (HCC)    Postoperative pain    Amputation above knee ([place]) 07/04/2019   S/P right knee disarticulation amputation 06/29/2019   Above knee amputation of right lower extremity ([place]) 06/29/2019   Hyperglycemia    Failed total right knee replacement (HCC)    Hepatic encephalopathy 12/28/2018   UTI (urinary tract infection) 12/28/2018   Bradycardia 12/28/2018   Hyperkalemia 12/28/2018   Overweight (BMI 25.0-29.9) 11/23/2018   Mechanical problem with extremity 11/22/2018   Cirrhosis of liver with ascites ([place]) 11/09/2018   Nausea without vomiting 10/26/2018   Ascites    Rectal bleeding    AKI (acute kidney injury) ([place]) 10/11/2018   Lower GI bleed 10/10/2018   Anasarca 10/10/2018   Hypoalbuminemia 10/10/2018   Depression 09/28/2018   Acute  hepatic encephalopathy 09/28/2018   Cirrhosis ([place]) 09/28/2018   Periprosthetic fracture around internal prosthetic right knee joint 09/24/2018   S/P right TKA 09/22/2018   Status post total knee replacement, right 09/22/2018   DM2 (diabetes mellitus, type 2) ([place]) 07/11/2015   Hypertension 07/11/2015   Hyperlipidemia 07/11/2015    Past Surgical History:  Procedure Laterality Date   AMPUTATION Right 06/29/2019   Procedure: AMPUTATION ABOVE KNEE through knee;  Surgeon: Paralee Cancel, MD;  Location: [facility];  Service: Orthopedics;  Laterality: Right;  2 hrs   CATARACT EXTRACTION W/PHACO Left 06/04/2017   Procedure: CATARACT EXTRACTION PHACO AND INTRAOCULAR LENS PLACEMENT (IOC);  Surgeon: Baruch Goldmann, MD;  Location: [facility];   Service: Ophthalmology;  Laterality: Left;  CDE: 3.90   CATARACT EXTRACTION W/PHACO Right 07/15/2017   Procedure: CATARACT EXTRACTION PHACO AND INTRAOCULAR LENS PLACEMENT (IOC);  Surgeon: Baruch Goldmann, MD;  Location: [facility];  Service: Ophthalmology;  Laterality: Right;  CDE: 7.60   COLONOSCOPY  06/2007   in [place]. for hematochzia.  radiation proctitis   DILATION AND CURETTAGE OF UTERUS     ESOPHAGOGASTRODUODENOSCOPY  2016   ESOPHAGOGASTRODUODENOSCOPY (EGD) WITH PROPOFOL N/A 12/30/2018   Procedure: EGD;  Surgeon: Clarene Essex, MD;  [facility];  Service: Endoscopy;  Laterality: N/A;   EXCISIONAL TOTAL KNEE ARTHROPLASTY Right 01/03/2019   Procedure: EXCISIONAL TOTAL KNEE ARTHROPLASTY;  Surgeon: Paralee Cancel, MD;  [facility]: [facility];  Service: Orthopedics;  Laterality: Right;   FLEXIBLE SIGMOIDOSCOPY N/A 12/30/2018   Procedure: FLEXIBLE SIGMOIDOSCOPY;  Surgeon: Clarene Essex, MD;  [facility];  Service: Endoscopy;  Laterality: N/A;   IR PARACENTESIS  10/13/2018   IR PARACENTESIS  07/21/2019   IR THORACENTESIS ASP PLEURAL SPACE W/IMG GUIDE  07/19/2019   STERIOD INJECTION Left 06/29/2019   Procedure: LEFT KNEE INJECTION;  Surgeon: Paralee Cancel, MD;  [facility];  Service: Orthopedics;  Laterality: Left;   TOTAL ABDOMINAL HYSTERECTOMY  1996   TOTAL KNEE ARTHROPLASTY Right 09/22/2018   Procedure: TOTAL KNEE ARTHROPLASTY;  Surgeon: Paralee Cancel, MD;  [facility];  Service: Orthopedics;  Laterality: Right;  70 mins   TOTAL KNEE REVISION Right 09/27/2018   Procedure: TOTAL KNEE REVISION;  Surgeon: Paralee Cancel, MD;  [facility];  Service: Orthopedics;  Laterality: Right;   TOTAL KNEE REVISION Right 11/22/2018   Procedure: repair right knee extensor mechanism;  Surgeon: Paralee Cancel, MD;  [facility];  Service: Orthopedics;  Laterality: Right;  90 mins     OB History   No obstetric history on file.     Family History  Problem Relation Age of Onset   Heart disease  Mother    Diabetes Mother    Heart disease Father    Cancer Father    Cancer Maternal Grandmother    Heart disease Maternal Grandfather     Social History   Tobacco Use   Smoking status: Never   Smokeless tobacco: Never  Vaping Use   Vaping Use: Never used  Substance Use Topics   Alcohol use: No   Drug use: No    Home Medications Prior to Admission medications   Medication Sig Start Date End Date Taking? Authorizing Provider  acetaminophen (TYLENOL) 325 MG tablet Take 1-2 tablets (325-650 mg total) by mouth every 4 (four) hours as needed for mild pain. 07/13/19   Love, Ivan Anchors, PA-C  ALPRAZolam (XANAX) 0.25 MG tablet Take 1 tablet (0.25 mg total) by mouth 2 (two) times daily. 01/06/19   Nettey,  Evalee Jefferson, MD  atenolol (TENORMIN) 25 MG tablet Take 1 tablet (25 mg total) by mouth daily. 10/18/20   Roxan Hockey, MD  atorvastatin (LIPITOR) 80 MG tablet Take 80 mg by mouth daily at 8 pm.    [provider]  benzonatate (TESSALON) 100 MG capsule Take 1 capsule (100 mg total) by mouth 3 (three) times daily as needed for cough. 10/18/20   Roxan Hockey, MD  buPROPion (WELLBUTRIN XL) 150 MG 24 hr tablet Take 150 mg by mouth in the morning.     [provider]  escitalopram (LEXAPRO) 20 MG tablet Take 20 mg by mouth in the morning.  05/17/19   [provider]  ferrous sulfate (FERROUSUL) 325 (65 FE) MG tablet Take 1 tablet (325 mg total) by mouth 2 (two) times daily with a meal. 10/18/20 11/17/20  Emokpae, Courage, MD  fluticasone (FLONASE) 50 MCG/ACT nasal spray Place 1 spray into both nostrils in the morning.    [provider]  furosemide (LASIX) 20 MG tablet Take 1 tablet (20 mg total) by mouth daily. 10/18/20   Roxan Hockey, MD  gabapentin (NEURONTIN) 300 MG capsule Take 1 capsule (300 mg total) by mouth 2 (two) times daily as needed (for nerve pain). 10/18/20   Emokpae, Courage, MD  insulin aspart (NOVOLOG FLEXPEN) 100 UNIT/ML FlexPen Inject 4 Units  into the skin in the morning and at bedtime. Can be up to 8 units twice a day depending on blood sugar. Take mid afternoon only if bloodsugar  is over 180  Takes in evening if it is 200 or over give up to 10 units per daughter    [provider]  Insulin Glargine (BASAGLAR KWIKPEN) 100 UNIT/ML Inject 25 Units into the skin See admin instructions. Inject 25 units if sugar is mid 100's.    [provider]  lactulose (CHRONULAC) 10 GM/15ML solution Take 45 mLs (30 g total) by mouth daily. 10/18/20   Roxan Hockey, MD  loratadine (CLARITIN) 10 MG tablet Take 10 mg by mouth in the morning.     [provider]  mesalamine (CANASA) 1000 MG suppository Place 1 suppository rectally at bedtime.    [provider]  Multiple Vitamin (MULTIVITAMIN WITH MINERALS) TABS tablet Take 1 tablet by mouth daily. 12/06/18   Danae Orleans, PA-C  pantoprazole (PROTONIX) 40 MG tablet Take 1 tablet (40 mg total) by mouth daily. 09/08/19   Orson Eva, MD  rifaximin (XIFAXAN) 550 MG TABS tablet Take 550 mg by mouth 2 (two) times daily.    [provider]  sitaGLIPtin-metformin (JANUMET) 50-500 MG per tablet Take 1 tablet by mouth 2 (two) times daily with a meal.    [provider]  spironolactone (ALDACTONE) 100 MG tablet Take 1 tablet (100 mg total) by mouth in the morning. 10/18/20   Roxan Hockey, MD  traZODone (DESYREL) 50 MG tablet Take 50 mg by mouth at bedtime as needed for sleep.  05/17/19   [provider]  vitamin B-12 (CYANOCOBALAMIN) 500 MCG tablet Take 1 tablet (500 mcg total) by mouth daily. 09/08/19   Orson Eva, MD  Ibuprofen-Diphenhydramine Cit (ADVIL PM PO) Take 60 mg by mouth at bedtime.  05/21/17  [provider]    Allergies    Asa [aspirin] and Other  Review of Systems   Review of Systems  Constitutional:        Per HPI, otherwise negative  HENT:         Per HPI, otherwise negative  Respiratory:         Per HPI, otherwise  negative  Cardiovascular:        Per HPI, otherwise negative  Gastrointestinal:  Negative for vomiting.  Endocrine:       Negative aside from HPI  Genitourinary:        Neg aside from HPI   Musculoskeletal:        Per HPI, otherwise negative  Skin: Negative.   Neurological:  Positive for weakness. Negative for syncope.   Physical Exam Updated Vital Signs BP 135/68 (BP Location: Left Arm)    Pulse 64    Temp 97.7 F (36.5 C) (Oral)    Resp 18    Ht 5' 2"  (1.575 m)    Wt 63.5 kg    SpO2 99%    BMI 25.61 kg/m   Physical Exam Vitals and nursing note reviewed.  Constitutional:      General: She is not in acute distress.    Appearance: She is well-developed.  HENT:     Head: Normocephalic and atraumatic.  Eyes:     Conjunctiva/sclera: Conjunctivae normal.  Cardiovascular:     Rate and Rhythm: Normal rate and regular rhythm.  Pulmonary:     Effort: Pulmonary effort is normal. No respiratory distress.     Breath sounds: Normal breath sounds. No stridor.    Abdominal:     General: There is no distension.  Skin:    General: Skin is warm and dry.  Neurological:     Mental Status: She is alert and oriented to person, place, and time.     Cranial Nerves: No cranial nerve deficit.    ED Results / Procedures / Treatments   Labs (all labs ordered are listed, but only abnormal results are displayed) Labs Reviewed  COMPREHENSIVE METABOLIC PANEL  CBC WITH DIFFERENTIAL/PLATELET  BRAIN NATRIURETIC PEPTIDE  AMMONIA    EKG EKG Interpretation  Date/Time:  Wednesday March 05 2021 14:10:33 EST Ventricular Rate:  57 PR Interval:  189 QRS Duration: 93 QT Interval:  519 QTC Calculation: 506 R Axis:   9 Text Interpretation: Sinus rhythm Anteroseptal infarct, age indeterminate Prolonged QT interval Baseline wander in lead(s) V4 Artifact Abnormal ECG Confirmed by Carmin Muskrat [phone]) on 03/05/2021 3:04:42 PM  Radiology DG Chest 2 View  Result Date: 03/05/2021 CLINICAL  DATA:  Chronic cough. Denies chest pain or shortness of breath. EXAM: CHEST - 2 VIEW COMPARISON:  Chest x-ray and CT chest dated March 03, 2021. FINDINGS: Increasing large right pleural effusion with continued partial collapse of the right upper lobe, progressive collapse of the right middle lobe, and unchanged complete collapse of the right lower lobe. The left lung is clear. Normal heart size. No acute osseous abnormality. IMPRESSION: 1. Increasing large right pleural effusion with progressive compressive atelectasis of the right lung. Electronically Signed   By: Titus Dubin M.D.   On: 03/05/2021 13:38    Procedures Procedures   Medications Ordered in ED Medications - No data to display  ED Course  I have reviewed the triage vital signs and the nursing notes.  Pertinent labs & imaging results that were available during my care of the patient were reviewed by me and considered in my medical decision making (see chart for details).  Cardiac 60 sinus normal Pulse ox 99% room air normal    3:04 PM Patient preparing for ultrasound thoracentesis.  I discussed this with the radiologist's. MDM Rules/Calculators/A&P Adult female presents with cough, fatigue.  Patient has multiple  medical issues, including hepatic dysfunction, was found to have right pleural effusion, with compressive atelectasis.  Patient's initial labs are reassuring, but given the enlarging stature of her effusion patient prepared for thoracentesis here.  On signout the patient is awaiting remaining studies, repeat assessment, disposition. Final Clinical Impression(s) / ED Diagnoses Final diagnoses:  Pleural effusion associated with hepatic disorder  MDM Number of Diagnoses or Management Options Pleural effusion associated with hepatic disorder: new, needed workup   Amount and/or Complexity of Data Reviewed Clinical lab tests: ordered and reviewed Tests in the radiology section of CPT: ordered and reviewed Tests in  the medicine section of CPT: reviewed and ordered Discussion of test results with the performing providers: yes Decide to obtain previous medical records or to obtain history from someone other than the patient: yes Obtain history from someone other than the patient: yes Review and summarize past medical records: yes Discuss the patient with other providers: yes Independent visualization of images, tracings, or specimens: yes  Risk of Complications, Morbidity, and/or Mortality Presenting problems: high Diagnostic procedures: high Management options: high  Critical Care Total time providing critical care: < 30 minutes  Patient Progress Patient progress: stable     Carmin Muskrat, MD 03/05/21 1506

## 2021-03-05 NOTE — ED Notes (Signed)
Dc instructions reviewed with pt. Pt wheeled to lobby and transport home by family

## 2021-03-05 NOTE — Procedures (Signed)
PreOperative Dx: RT pleural effusion Postoperative Dx: RT pleural effusion Procedure:   US guided RT thoracentesis Radiologist:  Thornton Papas Anesthesia:  10 ml of 1% lidocaine Specimen:  1.7 L of yellow colored fluid EBL:   < 1 ml Complications: None

## 2021-03-05 NOTE — Progress Notes (Signed)
PT tolerated right sided thoracentesis procedure well today and 1.7 Liters of pleural fluid removed with labs collected and sent for processing. Post chest xray completed and read by Dr. Thornton Papas. PT transported back to ED at this time with NAD noted and ED Nurse given report. Pt's husband at beside and updated as well. PT verbalized understanding of post procedure instructions.

## 2021-03-06 LAB — PROTEIN, BODY FLUID (OTHER): Total Protein, Body Fluid Other: 0.8 g/dL

## 2021-03-06 LAB — TRIGLYCERIDES, BODY FLUIDS: Triglycerides, Fluid: 39 mg/dL

## 2021-03-06 LAB — PATHOLOGIST SMEAR REVIEW

## 2021-03-07 LAB — GRAM STAIN: Gram Stain: NONE SEEN

## 2021-03-10 LAB — CULTURE, BODY FLUID W GRAM STAIN -BOTTLE
Culture: NO GROWTH
Special Requests: ADEQUATE

## 2021-03-27 DIAGNOSIS — E1165 Type 2 diabetes mellitus with hyperglycemia: Secondary | ICD-10-CM | POA: Diagnosis not present

## 2021-03-27 DIAGNOSIS — I700 Atherosclerosis of aorta: Secondary | ICD-10-CM | POA: Diagnosis not present

## 2021-03-27 DIAGNOSIS — R0602 Shortness of breath: Secondary | ICD-10-CM | POA: Diagnosis not present

## 2021-03-27 DIAGNOSIS — K769 Liver disease, unspecified: Secondary | ICD-10-CM | POA: Diagnosis not present

## 2021-03-28 ENCOUNTER — Ambulatory Visit (HOSPITAL_COMMUNITY)
Admission: RE | Admit: 2021-03-28 | Discharge: 2021-03-28 | Disposition: A | Discharge status: Home / Self Care | Payer: Medicare Other | Source: Ambulatory Visit | Attending: Diagnostic Radiology | Admitting: Diagnostic Radiology

## 2021-03-28 ENCOUNTER — Other Ambulatory Visit (HOSPITAL_COMMUNITY): Payer: Self-pay | Admitting: Physician Assistant

## 2021-03-28 ENCOUNTER — Ambulatory Visit (HOSPITAL_COMMUNITY)
Admission: RE | Admit: 2021-03-28 | Discharge: 2021-03-28 | Disposition: A | Discharge status: Home / Self Care | Payer: Medicare Other | Source: Ambulatory Visit | Attending: Physician Assistant | Admitting: Physician Assistant

## 2021-03-28 ENCOUNTER — Other Ambulatory Visit: Payer: Self-pay

## 2021-03-28 ENCOUNTER — Ambulatory Visit: Payer: Self-pay | Admitting: *Deleted

## 2021-03-28 ENCOUNTER — Other Ambulatory Visit: Payer: Self-pay | Admitting: Physician Assistant

## 2021-03-28 ENCOUNTER — Encounter (HOSPITAL_COMMUNITY): Payer: Self-pay

## 2021-03-28 DIAGNOSIS — K769 Liver disease, unspecified: Secondary | ICD-10-CM

## 2021-03-28 DIAGNOSIS — K7460 Unspecified cirrhosis of liver: Secondary | ICD-10-CM | POA: Diagnosis not present

## 2021-03-28 DIAGNOSIS — J9811 Atelectasis: Secondary | ICD-10-CM | POA: Diagnosis not present

## 2021-03-28 DIAGNOSIS — Z9889 Other specified postprocedural states: Secondary | ICD-10-CM | POA: Diagnosis not present

## 2021-03-28 DIAGNOSIS — J918 Pleural effusion in other conditions classified elsewhere: Secondary | ICD-10-CM

## 2021-03-28 DIAGNOSIS — J90 Pleural effusion, not elsewhere classified: Secondary | ICD-10-CM | POA: Diagnosis not present

## 2021-03-28 DIAGNOSIS — I517 Cardiomegaly: Secondary | ICD-10-CM | POA: Diagnosis not present

## 2021-03-28 NOTE — Telephone Encounter (Signed)
Answer Assessment - Initial Assessment Questions 1. ONSET: "When did the cough begin?"      Today after thoracentesis 2. SEVERITY: "How bad is the cough today?"      moderate 3. SPUTUM: "Describe the color of your sputum" (none, dry cough; clear, white, yellow, green)     Just phlegm 4. HEMOPTYSIS: "Are you coughing up any blood?" If so ask: "How much?" (flecks, streaks, tablespoons, etc.)     no 5. DIFFICULTY BREATHING: "Are you having difficulty breathing?" If Yes, ask: "How bad is it?" (e.g., mild, moderate, severe)    - MILD: No SOB at rest, mild SOB with walking, speaks normally in sentences, can lie down, no retractions, pulse < 100.    - MODERATE: SOB at rest, SOB with minimal exertion and prefers to sit, cannot lie down flat, speaks in phrases, mild retractions, audible wheezing, pulse 100-120.    - SEVERE: Very SOB at rest, speaks in single words, struggling to breathe, sitting hunched forward, retractions, pulse > 120      no 6. FEVER: "Do you have a fever?" If Yes, ask: "What is your temperature, how was it measured, and when did it start?"     Has not taken 7. CARDIAC HISTORY: "Do you have any history of heart disease?" (e.g., heart attack, congestive heart failure)      Liver disease 8. LUNG HISTORY: "Do you have any history of lung disease?"  (e.g., pulmonary embolus, asthma, emphysema)     Has liver disease that requires thoracentesis at times 10. OTHER SYMPTOMS: "Do you have any other symptoms?" (e.g., runny nose, wheezing, chest pain)       no 11. PREGNANCY: "Is there any chance you are pregnant?" "When was your last menstrual period?"       no  Protocols used: Cough - Acute Productive-A-AH Pt's husband called. States pt had a thoracentesis today and has been coughing since. Has had thoracentesis before without difficulty, todays seems different. Before I returned the call he had helped her into the hospital bed she has at home and she seems to be doing better, as he has  not heard her cough in a while. Asked him if he has an oxygen monitor. He does but says he would like to hang up and get it and check her then call me back. I asked him to please check on his wife first prior to looking for the monitor. Make sure she has stopped coughing because she is feeling better. I gave him the after hours number to reach me back after he checks her oxygen level. He states it may be 30 minutes but he will call me back. I called back since Mr Bibby did not call me. He has found the oxygen saturation machine but could not get it to work. I talked him through it and he was able to check his oxygen level to make sure it is working correctly and it is. His wife's oxygen level is 91. I looked at records and it is usually 99-100 on all the visits I saw, even prior to thoracentesis. We called EMS and I am staying on the line with him while he gathers her medical paper work from the procedure today and her list of medications. EMS has arrived, so I have disconnected from the pt's husband.

## 2021-03-28 NOTE — Procedures (Signed)
PreOperative Dx: Recurrent RIGHT pleural effusion Postoperative Dx: Recurrent RIGHT pleural effusion Procedure:   US guided RIGHT thoracentesis Radiologist:  Thornton Papas Anesthesia:  10 ml of 1% lidocaine Specimen:  1.55 L of clear yellow colored fluid EBL:   < 1 ml Complications: None

## 2021-03-28 NOTE — Progress Notes (Signed)
Right sided thoracentesis completed at this time and 1.55 Liters of clear yellow fluid removed. PT had chest xray post thoracentesis and read by radiologist. PT left via wheelchair with husband at this time and verbalized understanding of discharge instructions with NAD noted.

## 2021-03-31 ENCOUNTER — Other Ambulatory Visit (HOSPITAL_COMMUNITY): Payer: Self-pay | Admitting: Gastroenterology

## 2021-03-31 DIAGNOSIS — K7030 Alcoholic cirrhosis of liver without ascites: Secondary | ICD-10-CM

## 2021-03-31 DIAGNOSIS — K7460 Unspecified cirrhosis of liver: Secondary | ICD-10-CM | POA: Diagnosis not present

## 2021-03-31 DIAGNOSIS — K7682 Hepatic encephalopathy: Secondary | ICD-10-CM | POA: Diagnosis not present

## 2021-03-31 DIAGNOSIS — R188 Other ascites: Secondary | ICD-10-CM | POA: Diagnosis not present

## 2021-03-31 DIAGNOSIS — J90 Pleural effusion, not elsewhere classified: Secondary | ICD-10-CM | POA: Diagnosis not present

## 2021-03-31 DIAGNOSIS — K627 Radiation proctitis: Secondary | ICD-10-CM | POA: Diagnosis not present

## 2021-04-04 DIAGNOSIS — R300 Dysuria: Secondary | ICD-10-CM | POA: Diagnosis not present

## 2021-04-04 DIAGNOSIS — K7469 Other cirrhosis of liver: Secondary | ICD-10-CM | POA: Diagnosis not present

## 2021-04-09 ENCOUNTER — Encounter (HOSPITAL_COMMUNITY): Payer: Self-pay | Admitting: *Deleted

## 2021-04-09 ENCOUNTER — Emergency Department (HOSPITAL_COMMUNITY): Payer: Medicare Other

## 2021-04-09 ENCOUNTER — Inpatient Hospital Stay (HOSPITAL_COMMUNITY)
Admission: EM | Admit: 2021-04-09 | Discharge: 2021-04-11 | DRG: 442 | Disposition: A | Discharge status: Home Health | Payer: Medicare Other | Attending: Family Medicine | Admitting: Family Medicine

## 2021-04-09 ENCOUNTER — Other Ambulatory Visit: Payer: Self-pay

## 2021-04-09 DIAGNOSIS — Z90710 Acquired absence of both cervix and uterus: Secondary | ICD-10-CM

## 2021-04-09 DIAGNOSIS — N390 Urinary tract infection, site not specified: Secondary | ICD-10-CM | POA: Diagnosis not present

## 2021-04-09 DIAGNOSIS — J90 Pleural effusion, not elsewhere classified: Secondary | ICD-10-CM | POA: Diagnosis present

## 2021-04-09 DIAGNOSIS — D509 Iron deficiency anemia, unspecified: Secondary | ICD-10-CM | POA: Diagnosis present

## 2021-04-09 DIAGNOSIS — R404 Transient alteration of awareness: Secondary | ICD-10-CM | POA: Diagnosis not present

## 2021-04-09 DIAGNOSIS — K7460 Unspecified cirrhosis of liver: Secondary | ICD-10-CM | POA: Diagnosis present

## 2021-04-09 DIAGNOSIS — Z8542 Personal history of malignant neoplasm of other parts of uterus: Secondary | ICD-10-CM | POA: Diagnosis not present

## 2021-04-09 DIAGNOSIS — R791 Abnormal coagulation profile: Secondary | ICD-10-CM | POA: Diagnosis not present

## 2021-04-09 DIAGNOSIS — E7220 Disorder of urea cycle metabolism, unspecified: Secondary | ICD-10-CM

## 2021-04-09 DIAGNOSIS — Z833 Family history of diabetes mellitus: Secondary | ICD-10-CM

## 2021-04-09 DIAGNOSIS — I517 Cardiomegaly: Secondary | ICD-10-CM | POA: Diagnosis not present

## 2021-04-09 DIAGNOSIS — E8809 Other disorders of plasma-protein metabolism, not elsewhere classified: Secondary | ICD-10-CM | POA: Diagnosis present

## 2021-04-09 DIAGNOSIS — D519 Vitamin B12 deficiency anemia, unspecified: Secondary | ICD-10-CM | POA: Diagnosis present

## 2021-04-09 DIAGNOSIS — Z89611 Acquired absence of right leg above knee: Secondary | ICD-10-CM

## 2021-04-09 DIAGNOSIS — K7581 Nonalcoholic steatohepatitis (NASH): Secondary | ICD-10-CM | POA: Diagnosis present

## 2021-04-09 DIAGNOSIS — K219 Gastro-esophageal reflux disease without esophagitis: Secondary | ICD-10-CM | POA: Diagnosis present

## 2021-04-09 DIAGNOSIS — Z809 Family history of malignant neoplasm, unspecified: Secondary | ICD-10-CM

## 2021-04-09 DIAGNOSIS — R1310 Dysphagia, unspecified: Secondary | ICD-10-CM | POA: Diagnosis present

## 2021-04-09 DIAGNOSIS — D6959 Other secondary thrombocytopenia: Secondary | ICD-10-CM | POA: Diagnosis not present

## 2021-04-09 DIAGNOSIS — N189 Chronic kidney disease, unspecified: Secondary | ICD-10-CM | POA: Diagnosis present

## 2021-04-09 DIAGNOSIS — G546 Phantom limb syndrome with pain: Secondary | ICD-10-CM | POA: Diagnosis present

## 2021-04-09 DIAGNOSIS — E1165 Type 2 diabetes mellitus with hyperglycemia: Secondary | ICD-10-CM | POA: Diagnosis not present

## 2021-04-09 DIAGNOSIS — Z96651 Presence of right artificial knee joint: Secondary | ICD-10-CM | POA: Diagnosis present

## 2021-04-09 DIAGNOSIS — E876 Hypokalemia: Secondary | ICD-10-CM | POA: Diagnosis present

## 2021-04-09 DIAGNOSIS — D696 Thrombocytopenia, unspecified: Secondary | ICD-10-CM

## 2021-04-09 DIAGNOSIS — E785 Hyperlipidemia, unspecified: Secondary | ICD-10-CM | POA: Diagnosis not present

## 2021-04-09 DIAGNOSIS — D638 Anemia in other chronic diseases classified elsewhere: Secondary | ICD-10-CM | POA: Diagnosis present

## 2021-04-09 DIAGNOSIS — E119 Type 2 diabetes mellitus without complications: Secondary | ICD-10-CM

## 2021-04-09 DIAGNOSIS — K7210 Chronic hepatic failure without coma: Secondary | ICD-10-CM | POA: Diagnosis not present

## 2021-04-09 DIAGNOSIS — Z7984 Long term (current) use of oral hypoglycemic drugs: Secondary | ICD-10-CM

## 2021-04-09 DIAGNOSIS — F32A Depression, unspecified: Secondary | ICD-10-CM | POA: Diagnosis present

## 2021-04-09 DIAGNOSIS — N179 Acute kidney failure, unspecified: Secondary | ICD-10-CM | POA: Diagnosis present

## 2021-04-09 DIAGNOSIS — K7682 Hepatic encephalopathy: Secondary | ICD-10-CM | POA: Diagnosis present

## 2021-04-09 DIAGNOSIS — R7401 Elevation of levels of liver transaminase levels: Secondary | ICD-10-CM | POA: Diagnosis not present

## 2021-04-09 DIAGNOSIS — Z794 Long term (current) use of insulin: Secondary | ICD-10-CM

## 2021-04-09 DIAGNOSIS — Z923 Personal history of irradiation: Secondary | ICD-10-CM | POA: Diagnosis not present

## 2021-04-09 DIAGNOSIS — I10 Essential (primary) hypertension: Secondary | ICD-10-CM | POA: Diagnosis present

## 2021-04-09 DIAGNOSIS — E872 Acidosis, unspecified: Secondary | ICD-10-CM | POA: Diagnosis present

## 2021-04-09 DIAGNOSIS — R079 Chest pain, unspecified: Secondary | ICD-10-CM | POA: Diagnosis not present

## 2021-04-09 DIAGNOSIS — R739 Hyperglycemia, unspecified: Secondary | ICD-10-CM

## 2021-04-09 DIAGNOSIS — R0602 Shortness of breath: Secondary | ICD-10-CM | POA: Diagnosis not present

## 2021-04-09 DIAGNOSIS — R54 Age-related physical debility: Secondary | ICD-10-CM | POA: Diagnosis present

## 2021-04-09 DIAGNOSIS — R4182 Altered mental status, unspecified: Secondary | ICD-10-CM | POA: Diagnosis not present

## 2021-04-09 DIAGNOSIS — Z9114 Patient's other noncompliance with medication regimen: Secondary | ICD-10-CM

## 2021-04-09 DIAGNOSIS — Z743 Need for continuous supervision: Secondary | ICD-10-CM | POA: Diagnosis not present

## 2021-04-09 DIAGNOSIS — Z79899 Other long term (current) drug therapy: Secondary | ICD-10-CM

## 2021-04-09 DIAGNOSIS — R627 Adult failure to thrive: Secondary | ICD-10-CM | POA: Diagnosis present

## 2021-04-09 DIAGNOSIS — K7290 Hepatic failure, unspecified without coma: Secondary | ICD-10-CM | POA: Diagnosis not present

## 2021-04-09 DIAGNOSIS — Z8249 Family history of ischemic heart disease and other diseases of the circulatory system: Secondary | ICD-10-CM

## 2021-04-09 LAB — HEMOGLOBIN A1C
Hgb A1c MFr Bld: 5.7 % — ABNORMAL HIGH (ref 4.8–5.6)
Mean Plasma Glucose: 116.89 mg/dL

## 2021-04-09 LAB — CBC WITH DIFFERENTIAL/PLATELET
Abs Immature Granulocytes: 0.05 10*3/uL (ref 0.00–0.07)
Basophils Absolute: 0.1 10*3/uL (ref 0.0–0.1)
Basophils Relative: 1 %
Eosinophils Absolute: 0.3 10*3/uL (ref 0.0–0.5)
Eosinophils Relative: 7 %
HCT: 35.3 % — ABNORMAL LOW (ref 36.0–46.0)
Hemoglobin: 11.6 g/dL — ABNORMAL LOW (ref 12.0–15.0)
Immature Granulocytes: 1 %
Lymphocytes Relative: 18 %
Lymphs Abs: 0.8 10*3/uL (ref 0.7–4.0)
MCH: 31.6 pg (ref 26.0–34.0)
MCHC: 32.9 g/dL (ref 30.0–36.0)
MCV: 96.2 fL (ref 80.0–100.0)
Monocytes Absolute: 0.3 10*3/uL (ref 0.1–1.0)
Monocytes Relative: 8 %
Neutro Abs: 2.8 10*3/uL (ref 1.7–7.7)
Neutrophils Relative %: 65 %
Platelets: 69 10*3/uL — ABNORMAL LOW (ref 150–400)
RBC: 3.67 MIL/uL — ABNORMAL LOW (ref 3.87–5.11)
RDW: 16.6 % — ABNORMAL HIGH (ref 11.5–15.5)
WBC: 4.4 10*3/uL (ref 4.0–10.5)
nRBC: 0 % (ref 0.0–0.2)

## 2021-04-09 LAB — COMPREHENSIVE METABOLIC PANEL
ALT: 38 U/L (ref 0–44)
AST: 52 U/L — ABNORMAL HIGH (ref 15–41)
Albumin: 2.4 g/dL — ABNORMAL LOW (ref 3.5–5.0)
Alkaline Phosphatase: 94 U/L (ref 38–126)
Anion gap: 13 (ref 5–15)
BUN: 34 mg/dL — ABNORMAL HIGH (ref 8–23)
CO2: 25 mmol/L (ref 22–32)
Calcium: 8.5 mg/dL — ABNORMAL LOW (ref 8.9–10.3)
Chloride: 95 mmol/L — ABNORMAL LOW (ref 98–111)
Creatinine, Ser: 1.67 mg/dL — ABNORMAL HIGH (ref 0.44–1.00)
GFR, Estimated: 32 mL/min — ABNORMAL LOW (ref >60)
Glucose, Bld: 156 mg/dL — ABNORMAL HIGH (ref 70–99)
Potassium: 4.1 mmol/L (ref 3.5–5.1)
Sodium: 133 mmol/L — ABNORMAL LOW (ref 135–145)
Total Bilirubin: 2 mg/dL — ABNORMAL HIGH (ref 0.3–1.2)
Total Protein: 5.8 g/dL — ABNORMAL LOW (ref 6.5–8.1)

## 2021-04-09 LAB — LACTIC ACID, PLASMA
Lactic Acid, Venous: 2.9 mmol/L (ref 0.5–1.9)
Lactic Acid, Venous: 2.8 mmol/L (ref 0.5–1.9)

## 2021-04-09 LAB — AMMONIA: Ammonia: 72 umol/L — ABNORMAL HIGH (ref 9–35)

## 2021-04-09 LAB — CBG MONITORING, ED: Glucose-Capillary: 127 mg/dL — ABNORMAL HIGH (ref 70–99)

## 2021-04-09 LAB — GLUCOSE, CAPILLARY: Glucose-Capillary: 134 mg/dL — ABNORMAL HIGH (ref 70–99)

## 2021-04-09 MED ORDER — ONDANSETRON HCL 4 MG PO TABS: 4.0000 mg | ORAL_TABLET | Freq: Four times a day (QID) | ORAL | Status: DC | PRN

## 2021-04-09 MED ORDER — BUPROPION HCL ER (XL) 150 MG PO TB24
150.0000 mg | ORAL_TABLET | Freq: Every morning | ORAL | Status: DC
  Administered 2021-04-10 – 2021-04-11 (×2): 150 mg via ORAL
  Filled 2021-04-09 (×2): qty 1

## 2021-04-09 MED ORDER — ACETAMINOPHEN 325 MG PO TABS
325.0000 mg | ORAL_TABLET | ORAL | Status: DC | PRN
  Administered 2021-04-10: 650 mg via ORAL
  Filled 2021-04-09: qty 2

## 2021-04-09 MED ORDER — ONDANSETRON HCL 4 MG/2ML IJ SOLN
4.0000 mg | Freq: Four times a day (QID) | INTRAMUSCULAR | Status: DC | PRN
  Filled 2021-04-09: qty 2

## 2021-04-09 MED ORDER — INSULIN GLARGINE-YFGN 100 UNIT/ML ~~LOC~~ SOLN
12.0000 [IU] | Freq: Every day | SUBCUTANEOUS | Status: DC
  Administered 2021-04-10: 12 [IU] via SUBCUTANEOUS
  Filled 2021-04-09 (×3): qty 0.12

## 2021-04-09 MED ORDER — SODIUM CHLORIDE 0.9 % IV BOLUS
500.0000 mL | Freq: Once | INTRAVENOUS | Status: AC
  Administered 2021-04-09: 500 mL via INTRAVENOUS

## 2021-04-09 MED ORDER — ESCITALOPRAM OXALATE 10 MG PO TABS
20.0000 mg | ORAL_TABLET | Freq: Every morning | ORAL | Status: DC
  Administered 2021-04-10 – 2021-04-11 (×2): 20 mg via ORAL
  Filled 2021-04-09 (×2): qty 2

## 2021-04-09 MED ORDER — ATORVASTATIN CALCIUM 40 MG PO TABS
80.0000 mg | ORAL_TABLET | Freq: Every evening | ORAL | Status: DC
  Administered 2021-04-10: 80 mg via ORAL
  Filled 2021-04-09: qty 2

## 2021-04-09 MED ORDER — LACTULOSE ENEMA
300.0000 mL | Freq: Once | ORAL | Status: AC
  Administered 2021-04-09: 300 mL via RECTAL
  Filled 2021-04-09: qty 300

## 2021-04-09 MED ORDER — PANTOPRAZOLE SODIUM 40 MG PO TBEC
40.0000 mg | DELAYED_RELEASE_TABLET | Freq: Every day | ORAL | Status: DC
  Administered 2021-04-10 – 2021-04-11 (×2): 40 mg via ORAL
  Filled 2021-04-09 (×2): qty 1

## 2021-04-09 MED ORDER — SODIUM CHLORIDE 0.9 % IV SOLN
1.0000 g | INTRAVENOUS | Status: DC
  Administered 2021-04-09 – 2021-04-10 (×2): 1 g via INTRAVENOUS
  Filled 2021-04-09 (×2): qty 10

## 2021-04-09 MED ORDER — LACTULOSE 10 GM/15ML PO SOLN
20.0000 g | Freq: Three times a day (TID) | ORAL | Status: DC
  Administered 2021-04-09 – 2021-04-11 (×5): 20 g via ORAL
  Filled 2021-04-09 (×5): qty 30

## 2021-04-09 MED ORDER — SODIUM CHLORIDE 0.9 % IV SOLN: INTRAVENOUS | Status: DC

## 2021-04-09 MED ORDER — RIFAXIMIN 550 MG PO TABS
550.0000 mg | ORAL_TABLET | Freq: Two times a day (BID) | ORAL | Status: DC
  Administered 2021-04-09 – 2021-04-11 (×5): 550 mg via ORAL
  Filled 2021-04-09 (×5): qty 1

## 2021-04-09 MED ORDER — HEPARIN SODIUM (PORCINE) 5000 UNIT/ML IJ SOLN
5000.0000 [IU] | Freq: Three times a day (TID) | INTRAMUSCULAR | Status: DC
  Administered 2021-04-09 – 2021-04-10 (×3): 5000 [IU] via SUBCUTANEOUS
  Filled 2021-04-09 (×3): qty 1

## 2021-04-09 MED ORDER — INSULIN ASPART 100 UNIT/ML IJ SOLN
0.0000 [IU] | Freq: Three times a day (TID) | INTRAMUSCULAR | Status: DC
  Administered 2021-04-09: 1 [IU] via SUBCUTANEOUS
  Administered 2021-04-10 – 2021-04-11 (×4): 2 [IU] via SUBCUTANEOUS
  Filled 2021-04-09: qty 1

## 2021-04-09 NOTE — ED Notes (Signed)
Attempted report x1. 

## 2021-04-09 NOTE — ED Triage Notes (Addendum)
Pt brought in from home by [facility] with c/o worsening AMS. Pt recently diagnosed with UTI and on antibiotic x 3 days, but her AMS is worsening despite being on the medication. Family reported to EMS that pt is normally alert and oriented x4.  BP 116/70, HR 70, O2 sat 97% RA, CBG 179 for EMS.

## 2021-04-09 NOTE — H&P (Signed)
History and Physical  [place] JHE:[id number] DOB: 02-Nov-1945 DOA: 04/09/2021  PCP: Rosalee Kaufman, PA-C  Patient coming from: Home by [facility]  Level of care: Med-Surg  I have personally briefly reviewed patient's old medical records in [place]  Chief Complaint: altered mental status   HPI: Brittany Russo is a 76 y.o. female with medical history significant for liver cirrhosis, recurrent right pleural effusion status post recent thoracentesis 03/30/5629, nonalcoholic fatty liver disease, anasarca, ascites, anxiety, GERD prior treatments for hepatic encephalopathy, thrombocytopenia, history of endometrial cancer status post radiation treatments, history of GI hemorrhage, history of radiation proctosigmoiditis, hyperlipidemia, hypertension, remote history of seizures who reportedly has been feeling slow and weak with altered mentation over the past week.  Has not taken her lactulose because of not feeling well.  Was diagnosed with a UTI recently and antibiotics started 2 days ago.  Continued to have symptoms of lethargic and weakness and brought to the emergency department.  Family reports patient was told that she was at stage IV in terms of her cirrhosis diagnosis.  ED Course: EMS found patient lethargic, BP 116/70, heart rate 70, CBG 179, pulse ox 97% on room air, ammonia elevated at 72, lactic acid 2.9, BUN 34, creatinine 1.67, glucose 156, calcium 8.5, albumin 2.4, AST 52, ALT 38, total protein 5.8, total bilirubin 2.0, WBC 4.4, hemoglobin 11.6, platelet count 69.  Chest x-ray reveals right pleural effusion which is been chronic.  Lactulose enema ordered and admission requested.  Urinalysis pending.  Review of Systems: Review of Systems  Unable to perform ROS: Mental status change    Past Medical History:  Diagnosis Date   Acute and subacute hepatic failure without coma    Allergy    Anasarca    Anxiety    Arthritis    Ascites 09/2018   no SBP on 850  cc tap 10/13/18   Asthma    allery induced   Cirrhosis of liver ([place]) ~ 2004   from NAFLD.  Dx ~ 2004.  Dr Dorcas Mcmurray at [facility]/     Depression    Diabetes mellitus without complication [facility])    type 2   Eczema of both hands    Endometrial cancer ([place]) 1996, 2003   hysterectomy 1996.  recurrence 2003, treated with radiation.     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, DR Allyson Sabal Mir at [place] in [place]   Hyperlipidemia    Hypertension    Muscle weakness    Seizures ([place])    last seizure June 07, 2014    Thrombocytopenia [facility])    Thyroid nodule     Past Surgical History:  Procedure Laterality Date   AMPUTATION Right 06/29/2019   Procedure: AMPUTATION ABOVE KNEE through knee;  Surgeon: Paralee Cancel, MD;  Location: [facility];  Service: Orthopedics;  Laterality: Right;  2 hrs   CATARACT EXTRACTION W/PHACO Left 06/04/2017   Procedure: CATARACT EXTRACTION PHACO AND INTRAOCULAR LENS PLACEMENT (IOC);  Surgeon: Baruch Goldmann, MD;  Location: [facility];  Service: Ophthalmology;  Laterality: Left;  CDE: 3.90   CATARACT EXTRACTION W/PHACO Right 07/15/2017   Procedure: CATARACT EXTRACTION PHACO AND INTRAOCULAR LENS PLACEMENT (IOC);  Surgeon: Baruch Goldmann, MD;  Location: [facility];  Service: Ophthalmology;  Laterality: Right;  CDE: 7.60   COLONOSCOPY  06/2007   in [place]. for hematochzia.  radiation proctitis   DILATION AND CURETTAGE OF  UTERUS     ESOPHAGOGASTRODUODENOSCOPY  2016   ESOPHAGOGASTRODUODENOSCOPY (EGD) WITH PROPOFOL N/A 12/30/2018   Procedure: EGD;  Surgeon: Clarene Essex, MD;  [facility];  Service: Endoscopy;  Laterality: N/A;   EXCISIONAL TOTAL KNEE ARTHROPLASTY Right 01/03/2019   Procedure: EXCISIONAL TOTAL KNEE ARTHROPLASTY;  Surgeon: Paralee Cancel, MD;  [facility];  Service: Orthopedics;  Laterality: Right;   FLEXIBLE SIGMOIDOSCOPY N/A 12/30/2018   Procedure: FLEXIBLE SIGMOIDOSCOPY;  Surgeon:  Clarene Essex, MD;  [facility];  Service: Endoscopy;  Laterality: N/A;   IR PARACENTESIS  10/13/2018   IR PARACENTESIS  07/21/2019   IR THORACENTESIS ASP PLEURAL SPACE W/IMG GUIDE  07/19/2019   STERIOD INJECTION Left 06/29/2019   Procedure: LEFT KNEE INJECTION;  Surgeon: Paralee Cancel, MD;  [facility];  Service: Orthopedics;  Laterality: Left;   TOTAL ABDOMINAL HYSTERECTOMY  1996   TOTAL KNEE ARTHROPLASTY Right 09/22/2018   Procedure: TOTAL KNEE ARTHROPLASTY;  Surgeon: Paralee Cancel, MD;  [facility];  Service: Orthopedics;  Laterality: Right;  70 mins   TOTAL KNEE REVISION Right 09/27/2018   Procedure: TOTAL KNEE REVISION;  Surgeon: Paralee Cancel, MD;  [facility];  Service: Orthopedics;  Laterality: Right;   TOTAL KNEE REVISION Right 11/22/2018   Procedure: repair right knee extensor mechanism;  Surgeon: Paralee Cancel, MD;  [facility];  Service: Orthopedics;  Laterality: Right;  90 mins     reports that she has never smoked. She has never used smokeless tobacco. She reports that she does not drink alcohol and does not use drugs.  Allergies  Allergen Reactions   Asa [Aspirin] Other (See Comments)    Pt not allergic but cannot take due to bleeding    Other Other (See Comments)    Any jewelry metal-hives and itching     Family History  Problem Relation Age of Onset   Heart disease Mother    Diabetes Mother    Heart disease Father    Cancer Father    Cancer Maternal Grandmother    Heart disease Maternal Grandfather     Prior to Admission medications   Medication Sig Start Date End Date Taking? Authorizing Provider  acetaminophen (TYLENOL) 325 MG tablet Take 1-2 tablets (325-650 mg total) by mouth every 4 (four) hours as needed for mild pain. 07/13/19  Yes Love, Ivan Anchors, PA-C  ALPRAZolam (XANAX) 0.25 MG tablet Take 1 tablet (0.25 mg total) by mouth 2 (two) times daily. 01/06/19  Yes Mariel Aloe, MD  atenolol (TENORMIN) 25 MG tablet Take 1 tablet (25 mg  total) by mouth daily. 10/18/20  Yes Emokpae, Courage, MD  atorvastatin (LIPITOR) 80 MG tablet Take 80 mg by mouth daily at 8 pm.   Yes [provider]  buPROPion (WELLBUTRIN XL) 150 MG 24 hr tablet Take 150 mg by mouth in the morning.    Yes [provider]  cephALEXin (KEFLEX) 250 MG capsule Take 250 mg by mouth 3 (three) times daily. 04/07/21  Yes [provider]  escitalopram (LEXAPRO) 20 MG tablet Take 20 mg by mouth in the morning.  05/17/19  Yes [provider]  fluticasone (FLONASE) 50 MCG/ACT nasal spray Place 1 spray into both nostrils in the morning.   Yes [provider]  furosemide (LASIX) 20 MG tablet Take 1 tablet (20 mg total) by mouth daily. Patient taking differently: Take 40 mg by mouth daily. 10/18/20  Yes Emokpae, Courage, MD  insulin aspart (NOVOLOG FLEXPEN) 100 UNIT/ML FlexPen Inject 3-12  Units into the skin in the morning and at bedtime. Can be up to 8 units twice a day depending on blood sugar. Take mid afternoon only if bloodsugar  is over 180  Takes in evening if it is 200 or over give up to 10 units per daughter Sliding scale   Yes [provider]  Insulin Glargine (BASAGLAR KWIKPEN) 100 UNIT/ML Inject 20 Units into the skin See admin instructions. Inject 25 units if sugar is mid 100's.   Yes [provider]  lactulose (CHRONULAC) 10 GM/15ML solution Take 45 mLs (30 g total) by mouth daily. 10/18/20  Yes Emokpae, Courage, MD  loratadine (CLARITIN) 10 MG tablet Take 10 mg by mouth in the morning.    Yes [provider]  mesalamine (CANASA) 1000 MG suppository Place 1 suppository rectally at bedtime.   Yes [provider]  Multiple Vitamin (MULTIVITAMIN WITH MINERALS) TABS tablet Take 1 tablet by mouth daily. 12/06/18  Yes Babish, Rodman Key, PA-C  pantoprazole (PROTONIX) 40 MG tablet Take 1 tablet (40 mg total) by mouth daily. 09/08/19  Yes Tat, Shanon Brow, MD  rifaximin (XIFAXAN) 550 MG TABS tablet Take 550  mg by mouth 2 (two) times daily.   Yes [provider]  sitaGLIPtin-metformin (JANUMET) 50-500 MG per tablet Take 1 tablet by mouth 2 (two) times daily with a meal.   Yes [provider]  spironolactone (ALDACTONE) 100 MG tablet Take 1 tablet (100 mg total) by mouth in the morning. 10/18/20  Yes Emokpae, Courage, MD  traZODone (DESYREL) 50 MG tablet Take 50 mg by mouth at bedtime as needed for sleep.  05/17/19  Yes [provider]  ferrous sulfate (FERROUSUL) 325 (65 FE) MG tablet Take 1 tablet (325 mg total) by mouth 2 (two) times daily with a meal. 10/18/20 03/05/21  Roxan Hockey, MD  vitamin B-12 (CYANOCOBALAMIN) 500 MCG tablet Take 1 tablet (500 mcg total) by mouth daily. Patient not taking: Reported on 03/05/2021 09/08/19   Tat, Shanon Brow, MD  Ibuprofen-Diphenhydramine Cit (ADVIL PM PO) Take 60 mg by mouth at bedtime.  05/21/17  [provider]    Physical Exam: Vitals:   04/09/21 1100 04/09/21 1224 04/09/21 1300 04/09/21 1335  BP: (!) 115/55 (!) 117/55 (!) 109/53 (!) 99/50  Pulse: 63 65 65 61  Resp: 14 14 14 12   Temp:      TempSrc:      SpO2: 95% 93% 94% 96%  Weight:      Height:        Constitutional: Frail elderly female, awake, somnolent but arousable, speaking slowly, NAD, calm, comfortable Eyes: PERRL, lids and conjunctivae normal ENMT: Mucous membranes are dry. Posterior pharynx clear of any exudate or lesions. Neck: normal, supple, no masses, no thyromegaly Respiratory: Mild decreased breath sounds on the right side, no wheezing, no crackles. Normal respiratory effort. No accessory muscle use.  Cardiovascular: normal s1, s2 sounds, no murmurs / rubs / gallops. No extremity edema. 2+ pedal pulses. No carotid bruits.  Abdomen: no tenderness, no masses palpated. No hepatosplenomegaly. Bowel sounds positive.  Musculoskeletal: s/p right AKA Skin: no rashes, lesions, ulcers. No induration Neurologic: CN 2-12 grossly intact. Sensation intact, DTR  normal. Strength 5/5 in all 4.  Psychiatric: Poor judgment and insight.  Somnolent but arousable. Normal mood.   Labs on Admission: I have personally reviewed following labs and imaging studies  CBC: Recent Labs  Lab 04/09/21 1116  WBC 4.4  NEUTROABS 2.8  HGB 11.6*  HCT 35.3*  MCV 96.2  PLT 69*   Basic Metabolic Panel: Recent Labs  Lab 04/09/21 1116  NA 133*  K 4.1  CL 95*  CO2 25  GLUCOSE 156*  BUN 34*  CREATININE 1.67*  CALCIUM 8.5*   GFR: Estimated Creatinine Clearance: 26.3 mL/min (A) (by C-G formula based on SCr of 1.67 mg/dL (H)). Liver Function Tests: Recent Labs  Lab 04/09/21 1116  AST 52*  ALT 38  ALKPHOS 94  BILITOT 2.0*  PROT 5.8*  ALBUMIN 2.4*   No results for input(s): LIPASE, AMYLASE in the last 168 hours. Recent Labs  Lab 04/09/21 1121  AMMONIA 72*   Coagulation Profile: No results for input(s): INR, PROTIME in the last 168 hours. Cardiac Enzymes: No results for input(s): CKTOTAL, CKMB, CKMBINDEX, TROPONINI in the last 168 hours. BNP (last 3 results) No results for input(s): PROBNP in the last 8760 hours. HbA1C: No results for input(s): HGBA1C in the last 72 hours. CBG: No results for input(s): GLUCAP in the last 168 hours. Lipid Profile: No results for input(s): CHOL, HDL, LDLCALC, TRIG, CHOLHDL, LDLDIRECT in the last 72 hours. Thyroid Function Tests: No results for input(s): TSH, T4TOTAL, FREET4, T3FREE, THYROIDAB in the last 72 hours. Anemia Panel: No results for input(s): VITAMINB12, FOLATE, FERRITIN, TIBC, IRON, RETICCTPCT in the last 72 hours. Urine analysis:    Component Value Date/Time   COLORURINE YELLOW 10/16/2020 1532   APPEARANCEUR HAZY (A) 10/16/2020 1532   LABSPEC 1.005 10/16/2020 1532   PHURINE 6.0 10/16/2020 1532   GLUCOSEU NEGATIVE 10/16/2020 1532   HGBUR MODERATE (A) 10/16/2020 1532   BILIRUBINUR NEGATIVE 10/16/2020 1532   BILIRUBINUR NEGATIVE 03/06/2013 1100   KETONESUR NEGATIVE 10/16/2020 1532   PROTEINUR  NEGATIVE 10/16/2020 1532   UROBILINOGEN negative 03/06/2013 1100   NITRITE NEGATIVE 10/16/2020 1532   LEUKOCYTESUR LARGE (A) 10/16/2020 1532    Radiological Exams on Admission: DG Chest Port 1 View  Result Date: 04/09/2021 CLINICAL DATA:  Shortness of breath EXAM: PORTABLE CHEST 1 VIEW COMPARISON:  Previous studies including the examination of 03/28/2021 FINDINGS: Transverse diameter of heart is increased. There are no signs of alveolar pulmonary edema. There is increased haziness in the right mid and right lower lung fields suggesting increase in amount of right pleural effusion. Left lateral CP angle is clear. There is no pneumothorax. IMPRESSION: Cardiomegaly. There is interval increase in amount of right pleural effusion. There is no pneumothorax. Electronically Signed   By: Elmer Picker M.D.   On: 04/09/2021 13:18    Assessment/Plan Principal Problem:   Acute hepatic encephalopathy Active Problems:   DM2 (diabetes mellitus, type 2) (HCC)   Hypertension   Hyperlipidemia   S/P right TKA   Cirrhosis (HCC)   Hypoalbuminemia   AKI (acute kidney injury) ([place])   Hyperglycemia   Thrombocytopenia (HCC)   Phantom limb pain (HCC)   Hyperammonemia (HCC)   Transaminitis   Elevated INR   Recurrent right pleural effusion   Acute hepatic encephalopathy  -Patient presents with hyperammonemia -Poor compliance with taking oral lactulose -Continue lactulose enema as ordered -Lactulose oral to be started afterward -Follow-up ammonia level -dysphagia diet for now, family reports she is having difficulty swallowing -SLP evaluation requested   Liver cirrhosis from NAFLD - pt remains on rifaximin  - lactulose ordered - temporarily holding diuretics  - PT/INR in AM   AKI - prerenal from poor oral intake and diuretics - Gentle IV fluid hydration  - follow BMP  Anemia in chronic disease - no evidence of GI bleeding -  follow CBC for now  UTI - recently started outpatient  treatments - IV ceftriaxone ordered for now  Depression in chronic disease - resumed home meds  DVT prophylaxis: sq heparin   Code Status: Full   Family Communication: daughter at bedside updated 1/18 Disposition Plan: home   Consults called:   Admission status: INP  Level of care: Med-Surg Irwin Brakeman MD [facility] How to contact the [facility] Attending or Consulting provider [place] or covering provider during after hours [place], for this patient?  Check the care team in [facility] and look for a) attending/consulting [facility] provider listed and b) the [facility] team listed Log into [email or web address] and use [place]'s universal password to access. If you do not have the password, please contact the hospital operator. Locate the [facility] provider you are looking for under [facility] and page to a number that you can be directly reached. If you still have difficulty reaching the provider, please page the [facility] (Director on Call) for the Hospitalists listed on amion for assistance.   If 7PM-7AM, please contact night-coverage [email or web address] Password [facility]  04/09/2021, 2:37 PM

## 2021-04-09 NOTE — ED Provider Notes (Signed)
[facility] EMERGENCY DEPARTMENT Provider Note   CSN: [id number] Arrival date & time: 04/09/21  1024     History  Chief Complaint  Patient presents with   Altered Mental Status    Brittany Russo is a 76 y.o. female.  Patient has a history of cirrhosis.  According to her daughter she has been confused for couple days.  She has been treated for UTI but has not improved.  No vomiting or diarrhea  The history is provided by the patient and a relative. No language interpreter was used.  Altered Mental Status Presenting symptoms: disorientation   Severity:  Moderate Most recent episode:  2 days ago Episode history:  Continuous Timing:  Constant Progression:  Waxing and waning Chronicity:  Recurrent Context: not alcohol use   Associated symptoms: no abdominal pain       Home Medications Prior to Admission medications   Medication Sig Start Date End Date Taking? Authorizing Provider  acetaminophen (TYLENOL) 325 MG tablet Take 1-2 tablets (325-650 mg total) by mouth every 4 (four) hours as needed for mild pain. 07/13/19  Yes Love, Ivan Anchors, PA-C  ALPRAZolam (XANAX) 0.25 MG tablet Take 1 tablet (0.25 mg total) by mouth 2 (two) times daily. 01/06/19  Yes Mariel Aloe, MD  atenolol (TENORMIN) 25 MG tablet Take 1 tablet (25 mg total) by mouth daily. 10/18/20  Yes Emokpae, Courage, MD  atorvastatin (LIPITOR) 80 MG tablet Take 80 mg by mouth daily at 8 pm.   Yes [provider]  buPROPion (WELLBUTRIN XL) 150 MG 24 hr tablet Take 150 mg by mouth in the morning.    Yes [provider]  cephALEXin (KEFLEX) 250 MG capsule Take 250 mg by mouth 3 (three) times daily. 04/07/21  Yes [provider]  escitalopram (LEXAPRO) 20 MG tablet Take 20 mg by mouth in the morning.  05/17/19  Yes [provider]  fluticasone (FLONASE) 50 MCG/ACT nasal spray Place 1 spray into both nostrils in the morning.   Yes [provider]  furosemide (LASIX) 20 MG tablet Take  1 tablet (20 mg total) by mouth daily. Patient taking differently: Take 40 mg by mouth daily. 10/18/20  Yes Emokpae, Courage, MD  insulin aspart (NOVOLOG FLEXPEN) 100 UNIT/ML FlexPen Inject 3-12 Units into the skin in the morning and at bedtime. Can be up to 8 units twice a day depending on blood sugar. Take mid afternoon only if bloodsugar  is over 180  Takes in evening if it is 200 or over give up to 10 units per daughter Sliding scale   Yes [provider]  Insulin Glargine (BASAGLAR KWIKPEN) 100 UNIT/ML Inject 20 Units into the skin See admin instructions. Inject 25 units if sugar is mid 100's.   Yes [provider]  lactulose (CHRONULAC) 10 GM/15ML solution Take 45 mLs (30 g total) by mouth daily. 10/18/20  Yes Emokpae, Courage, MD  loratadine (CLARITIN) 10 MG tablet Take 10 mg by mouth in the morning.    Yes [provider]  mesalamine (CANASA) 1000 MG suppository Place 1 suppository rectally at bedtime.   Yes [provider]  Multiple Vitamin (MULTIVITAMIN WITH MINERALS) TABS tablet Take 1 tablet by mouth daily. 12/06/18  Yes Babish, Rodman Key, PA-C  pantoprazole (PROTONIX) 40 MG tablet Take 1 tablet (40 mg total) by mouth daily. 09/08/19  Yes Tat, Shanon Brow, MD  rifaximin (XIFAXAN) 550 MG TABS tablet Take 550 mg by mouth 2 (two) times daily.   Yes [provider]  sitaGLIPtin-metformin (JANUMET) 50-500 MG per tablet Take 1 tablet by mouth 2 (two) times daily with a meal.   Yes [provider]  spironolactone (ALDACTONE) 100 MG tablet Take 1 tablet (100 mg total) by mouth in the morning. 10/18/20  Yes Emokpae, Courage, MD  traZODone (DESYREL) 50 MG tablet Take 50 mg by mouth at bedtime as needed for sleep.  05/17/19  Yes [provider]  benzonatate (TESSALON) 100 MG capsule Take 1 capsule (100 mg total) by mouth 3 (three) times daily as needed for cough. Patient not taking: Reported on 03/05/2021 10/18/20   Roxan Hockey, MD  ferrous sulfate  (FERROUSUL) 325 (65 FE) MG tablet Take 1 tablet (325 mg total) by mouth 2 (two) times daily with a meal. 10/18/20 03/05/21  Roxan Hockey, MD  gabapentin (NEURONTIN) 300 MG capsule Take 1 capsule (300 mg total) by mouth 2 (two) times daily as needed (for nerve pain). Patient not taking: Reported on 04/09/2021 10/18/20   Roxan Hockey, MD  vitamin B-12 (CYANOCOBALAMIN) 500 MCG tablet Take 1 tablet (500 mcg total) by mouth daily. Patient not taking: Reported on 03/05/2021 09/08/19   Tat, Shanon Brow, MD  Ibuprofen-Diphenhydramine Cit (ADVIL PM PO) Take 60 mg by mouth at bedtime.  05/21/17  [provider]      Allergies    Asa [aspirin] and Other    Review of Systems   Review of Systems  Unable to perform ROS: Mental status change  Gastrointestinal:  Negative for abdominal pain.   Physical Exam Updated Vital Signs BP (!) 99/50    Pulse 61    Temp 97.6 F (36.4 C) (Oral)    Resp 12    Ht 5\' 2"  (1.575 m)    Wt 63.5 kg    SpO2 96%    BMI 25.60 kg/m  Physical Exam Vitals and nursing note reviewed.  Constitutional:      Appearance: She is well-developed.  HENT:     Head: Normocephalic.  Eyes:     General: No scleral icterus.    Conjunctiva/sclera: Conjunctivae normal.  Neck:     Thyroid: No thyromegaly.  Cardiovascular:     Rate and Rhythm: Normal rate and regular rhythm.     Heart sounds: No murmur heard.   No friction rub. No gallop.  Pulmonary:     Breath sounds: No stridor. No wheezing or rales.  Chest:     Chest wall: No tenderness.  Abdominal:     General: There is no distension.     Tenderness: There is no abdominal tenderness. There is no rebound.  Musculoskeletal:        General: Normal range of motion.     Cervical back: Neck supple.  Lymphadenopathy:     Cervical: No cervical adenopathy.  Skin:    Findings: No erythema or rash.  Neurological:     Mental Status: She is alert.     Motor: No abnormal muscle tone.     Coordination: Coordination normal.      Comments: Patient is only oriented to person she would not follow commands  Psychiatric:        Behavior: Behavior normal.    ED Results / Procedures / Treatments   Labs (all labs ordered are listed, but only abnormal results are displayed) Labs Reviewed  CBC WITH DIFFERENTIAL/PLATELET - Abnormal; Notable for the following components:      Result Value   RBC 3.67 (*)    Hemoglobin 11.6 (*)    HCT  35.3 (*)    RDW 16.6 (*)    Platelets 69 (*)    All other components within normal limits  COMPREHENSIVE METABOLIC PANEL - Abnormal; Notable for the following components:   Sodium 133 (*)    Chloride 95 (*)    Glucose, Bld 156 (*)    BUN 34 (*)    Creatinine, Ser 1.67 (*)    Calcium 8.5 (*)    Total Protein 5.8 (*)    Albumin 2.4 (*)    AST 52 (*)    Total Bilirubin 2.0 (*)    GFR, Estimated 32 (*)    All other components within normal limits  LACTIC ACID, PLASMA - Abnormal; Notable for the following components:   Lactic Acid, Venous 2.9 (*)    All other components within normal limits  AMMONIA - Abnormal; Notable for the following components:   Ammonia 72 (*)    All other components within normal limits  URINALYSIS, ROUTINE W REFLEX MICROSCOPIC    EKG None  Radiology DG Chest Port 1 View  Result Date: 04/09/2021 CLINICAL DATA:  Shortness of breath EXAM: PORTABLE CHEST 1 VIEW COMPARISON:  Previous studies including the examination of 03/28/2021 FINDINGS: Transverse diameter of heart is increased. There are no signs of alveolar pulmonary edema. There is increased haziness in the right mid and right lower lung fields suggesting increase in amount of right pleural effusion. Left lateral CP angle is clear. There is no pneumothorax. IMPRESSION: Cardiomegaly. There is interval increase in amount of right pleural effusion. There is no pneumothorax. Electronically Signed   By: Elmer Picker M.D.   On: 04/09/2021 13:18    Procedures Procedures    Medications Ordered in  ED Medications  lactulose (CHRONULAC) enema 200 gm (has no administration in time range)  sodium chloride 0.9 % bolus 500 mL (0 mLs Intravenous Stopped 04/09/21 1224)    ED Course/ Medical Decision Making/ A&P                           Medical Decision Making Amount and/or Complexity of Data Reviewed Labs: ordered. Radiology: ordered.  Risk Decision regarding hospitalization.   Patient has hepatic encephalopathy.  Her ammonia level is 72.  She is given rectal lactulose and will be admitted to medicine   This patient presents to the ED for concern of altered mental status, this involves an extensive number of treatment options, and is a complaint that carries with it a high risk of complications and morbidity.  The differential diagnosis includes sepsis, UTI, worsening cirrhosis   Co morbidities that complicate the patient evaluation  Hepatic failure   Additional history obtained:  Additional history obtained from daughter External records from outside source obtained and reviewed including hospital records   Lab Tests:  I Ordered, and personally interpreted labs.  The pertinent results include: Ammonia level elevated at 72   Imaging Studies ordered:  I ordered imaging studies including chest I independently visualized and interpreted imaging which showed cardiomegaly I agree with the radiologist interpretation   Cardiac Monitoring:  The patient was maintained on a cardiac monitor.  I personally viewed and interpreted the cardiac monitored which showed an underlying rhythm of: Normal sinus rhythm   Medicines ordered and prescription drug management:  I ordered medication including lactulose for hepatic encephalopathy Reevaluation of the patient after these medicines showed that the patient stayed the same I have reviewed the patients home medicines and have made adjustments as  needed   Test Considered:  CT head   Critical Interventions:  Rectal  lactulose   Consultations Obtained:  I requested consultation with the hospitalist,  and discussed lab and imaging findings as well as pertinent plan - they recommend: Admission   Problem List / ED Course:  Hepatic failure cirrhosis and altered mental status   Reevaluation:  After the interventions noted above, I reevaluated the patient and found that they have :stayed the same   Social Determinants of Health:  None   Dispostion:  After consideration of the diagnostic results and the patients response to treatment, I feel that the patent would benefit from admission to the hospital with an treatment with lactulose.         Final Clinical Impression(s) / ED Diagnoses Final diagnoses:  Hepatic encephalopathy    Rx / DC Orders ED Discharge Orders     None         Milton Ferguson, MD 04/10/21 [phone]

## 2021-04-10 DIAGNOSIS — E785 Hyperlipidemia, unspecified: Secondary | ICD-10-CM

## 2021-04-10 DIAGNOSIS — E119 Type 2 diabetes mellitus without complications: Secondary | ICD-10-CM

## 2021-04-10 DIAGNOSIS — R627 Adult failure to thrive: Secondary | ICD-10-CM | POA: Diagnosis present

## 2021-04-10 LAB — URINALYSIS, MICROSCOPIC (REFLEX): WBC, UA: >50 WBC/hpf (ref 0–5)

## 2021-04-10 LAB — GLUCOSE, CAPILLARY
Glucose-Capillary: 102 mg/dL — ABNORMAL HIGH (ref 70–99)
Glucose-Capillary: 107 mg/dL — ABNORMAL HIGH (ref 70–99)
Glucose-Capillary: 176 mg/dL — ABNORMAL HIGH (ref 70–99)
Glucose-Capillary: 192 mg/dL — ABNORMAL HIGH (ref 70–99)
Glucose-Capillary: 218 mg/dL — ABNORMAL HIGH (ref 70–99)

## 2021-04-10 LAB — AMMONIA: Ammonia: 108 umol/L — ABNORMAL HIGH (ref 9–35)

## 2021-04-10 LAB — URINALYSIS, ROUTINE W REFLEX MICROSCOPIC
Bilirubin Urine: NEGATIVE
Glucose, UA: NEGATIVE mg/dL
Ketones, ur: NEGATIVE mg/dL
Nitrite: POSITIVE — AB
Protein, ur: 30 mg/dL — AB
Specific Gravity, Urine: 1.025 (ref 1.005–1.030)
pH: 6 (ref 5.0–8.0)

## 2021-04-10 LAB — COMPREHENSIVE METABOLIC PANEL
ALT: 35 U/L (ref 0–44)
AST: 49 U/L — ABNORMAL HIGH (ref 15–41)
Albumin: 2.3 g/dL — ABNORMAL LOW (ref 3.5–5.0)
Alkaline Phosphatase: 81 U/L (ref 38–126)
Anion gap: 11 (ref 5–15)
BUN: 29 mg/dL — ABNORMAL HIGH (ref 8–23)
CO2: 22 mmol/L (ref 22–32)
Calcium: 8.1 mg/dL — ABNORMAL LOW (ref 8.9–10.3)
Chloride: 102 mmol/L (ref 98–111)
Creatinine, Ser: 1.44 mg/dL — ABNORMAL HIGH (ref 0.44–1.00)
GFR, Estimated: 38 mL/min — ABNORMAL LOW (ref >60)
Glucose, Bld: 106 mg/dL — ABNORMAL HIGH (ref 70–99)
Potassium: 3.5 mmol/L (ref 3.5–5.1)
Sodium: 135 mmol/L (ref 135–145)
Total Bilirubin: 1.6 mg/dL — ABNORMAL HIGH (ref 0.3–1.2)
Total Protein: 5.4 g/dL — ABNORMAL LOW (ref 6.5–8.1)

## 2021-04-10 LAB — CBC
HCT: 35.2 % — ABNORMAL LOW (ref 36.0–46.0)
Hemoglobin: 11.1 g/dL — ABNORMAL LOW (ref 12.0–15.0)
MCH: 31.5 pg (ref 26.0–34.0)
MCHC: 31.5 g/dL (ref 30.0–36.0)
MCV: 100 fL (ref 80.0–100.0)
Platelets: 51 10*3/uL — ABNORMAL LOW (ref 150–400)
RBC: 3.52 MIL/uL — ABNORMAL LOW (ref 3.87–5.11)
RDW: 16.9 % — ABNORMAL HIGH (ref 11.5–15.5)
WBC: 3.7 10*3/uL — ABNORMAL LOW (ref 4.0–10.5)
nRBC: 0 % (ref 0.0–0.2)

## 2021-04-10 LAB — PROTIME-INR
INR: 1.4 — ABNORMAL HIGH (ref 0.8–1.2)
Prothrombin Time: 16.7 seconds — ABNORMAL HIGH (ref 11.4–15.2)

## 2021-04-10 MED ORDER — TRAMADOL HCL 50 MG PO TABS
50.0000 mg | ORAL_TABLET | Freq: Once | ORAL | Status: AC
  Administered 2021-04-10: 50 mg via ORAL
  Filled 2021-04-10: qty 1

## 2021-04-10 MED ORDER — SODIUM CHLORIDE 0.9 % IV SOLN: INTRAVENOUS | Status: AC

## 2021-04-10 NOTE — Progress Notes (Signed)
PROGRESS NOTE   Brittany Russo  KPT:[id number] DOB: 10-27-1945 DOA: 04/09/2021 PCP: Rosalee Kaufman, PA-C   Chief Complaint  Patient presents with   Altered Mental Status   Level of care: Med-Surg  Brief Admission History:  76 y.o. female with medical history significant for liver cirrhosis, recurrent right pleural effusion status post recent thoracentesis 03/30/15, nonalcoholic fatty liver disease, anasarca, ascites, anxiety, GERD prior treatments for hepatic encephalopathy, thrombocytopenia, history of endometrial cancer status post radiation treatments, history of GI hemorrhage, history of radiation proctosigmoiditis, hyperlipidemia, hypertension, remote history of seizures who reportedly has been feeling slow and weak with altered mentation over the past week.  Has not taken her lactulose because of not feeling well.  Was diagnosed with a UTI recently and antibiotics started 2 days ago.  Continued to have symptoms of lethargic and weakness and brought to the emergency department.  Family reports patient was told that she was at stage IV in terms of her cirrhosis diagnosis.   ED Course: EMS found patient lethargic, BP 116/70, heart rate 70, CBG 179, pulse ox 97% on room air, ammonia elevated at 72, lactic acid 2.9, BUN 34, creatinine 1.67, glucose 156, calcium 8.5, albumin 2.4, AST 52, ALT 38, total protein 5.8, total bilirubin 2.0, WBC 4.4, hemoglobin 11.6, platelet count 69.  Chest x-ray reveals right pleural effusion which is been chronic.  Lactulose enema ordered and admission requested.  Urinalysis suggesting infection.   Assessment & Plan:   Principal Problem:   Acute hepatic encephalopathy Active Problems:   DM2 (diabetes mellitus, type 2) (HCC)   Hypertension   Hyperlipidemia   S/P right TKA   Cirrhosis (HCC)   Hypoalbuminemia   AKI (acute kidney injury) (HCC)   Hyperglycemia   Thrombocytopenia (HCC)   Phantom limb pain (HCC)   Hyperammonemia (HCC)   Transaminitis    Elevated INR   Recurrent right pleural effusion  Acute hepatic encephalopathy  -Patient presented with hyperammonemia, which persists despite lactulose administration -Poor compliance with taking oral lactulose in last week per family -Continue lactulose and rifaximin as ordered -Follow-up ammonia level -dysphagia diet for now, family reports she is having difficulty swallowing -SLP evaluation requested    Liver cirrhosis from NAFLD - pt remains on rifaximin  - lactulose ordered - temporarily holding diuretics  - PT/INR followed daily   AKI - prerenal from poor oral intake and diuretics - Gentle IV fluid hydration  - follow BMP   Anemia in chronic disease - no evidence of GI bleeding - follow CBC for now   UTI - recently started outpatient treatments - IV ceftriaxone ordered for now   Depression in chronic disease - resumed home meds  Thrombocytopenia - from chronic liver disease - holding heparin products - SCDs for DVT prophylaxis   DVT prophylaxis: SCDs Code Status: FULL  Family Communication: daughter at bedside  Disposition: home  Status is: Inpatient  Remains inpatient appropriate because: severity of illness  Consultants:  GI service   Procedures:    Antimicrobials:  Ceftriaxone 1/18>>   Subjective: Pt about the same as yesterday, had multiple bowel movements since admission   Objective: Vitals:   04/09/21 1856 04/10/21 0012 04/10/21 0048 04/10/21 0510  BP: (!) 141/59  (!) 125/51 133/60  Pulse: 64  63 (!) 59  Resp: 14  16 19   Temp:  97.9 F (36.6 C) (!) 97.4 F (36.3 C) 97.8 F (36.6 C)  TempSrc:  Temporal Oral   SpO2: 99%  97% 99%  Weight:  Height:        Intake/Output Summary (Last 24 hours) at 04/10/2021 1254 Last data filed at 04/10/2021 0900 Gross per 24 hour  Intake 1100.51 ml  Output 200 ml  Net 900.51 ml   Filed Weights   04/09/21 1041  Weight: 63.5 kg    Examination:  General exam: frail, elderly female, Appears  calm and comfortable  Respiratory system: Clear to auscultation. Respiratory effort normal. Cardiovascular system: normal S1 & S2 heard. No JVD, murmurs, rubs, gallops or clicks. No pedal edema. Gastrointestinal system: Abdomen is mildly distended, soft and nontender. No organomegaly or masses felt. Normal bowel sounds heard. Central nervous system: Alert and oriented. No focal neurological deficits. Extremities: Symmetric 5 x 5 power. Skin: No rashes, lesions or ulcers Psychiatry: Judgement and insight appear normal. Mood & affect appropriate.   Data Reviewed: I have personally reviewed following labs and imaging studies  CBC: Recent Labs  Lab 04/09/21 1116 04/10/21 0548  WBC 4.4 3.7*  NEUTROABS 2.8  --   HGB 11.6* 11.1*  HCT 35.3* 35.2*  MCV 96.2 100.0  PLT 69* 51*    Basic Metabolic Panel: Recent Labs  Lab 04/09/21 1116 04/10/21 0548  NA 133* 135  K 4.1 3.5  CL 95* 102  CO2 25 22  GLUCOSE 156* 106*  BUN 34* 29*  CREATININE 1.67* 1.44*  CALCIUM 8.5* 8.1*    GFR: Estimated Creatinine Clearance: 30.5 mL/min (A) (by C-G formula based on SCr of 1.44 mg/dL (H)).  Liver Function Tests: Recent Labs  Lab 04/09/21 1116 04/10/21 0548  AST 52* 49*  ALT 38 35  ALKPHOS 94 81  BILITOT 2.0* 1.6*  PROT 5.8* 5.4*  ALBUMIN 2.4* 2.3*    CBG: Recent Labs  Lab 04/09/21 1739 04/09/21 2143 04/10/21 0313 04/10/21 0723 04/10/21 1122  GLUCAP 127* 134* 102* 107* 176*    No results found for this or any previous visit (from the past 240 hour(s)).   Radiology Studies: DG Chest Port 1 View  Result Date: 04/09/2021 CLINICAL DATA:  Shortness of breath EXAM: PORTABLE CHEST 1 VIEW COMPARISON:  Previous studies including the examination of 03/28/2021 FINDINGS: Transverse diameter of heart is increased. There are no signs of alveolar pulmonary edema. There is increased haziness in the right mid and right lower lung fields suggesting increase in amount of right pleural effusion.  Left lateral CP angle is clear. There is no pneumothorax. IMPRESSION: Cardiomegaly. There is interval increase in amount of right pleural effusion. There is no pneumothorax. Electronically Signed   By: Elmer Picker M.D.   On: 04/09/2021 13:18    Scheduled Meds:  atorvastatin  80 mg Oral QPM   buPROPion  150 mg Oral q AM   escitalopram  20 mg Oral q AM   heparin  5,000 Units Subcutaneous Q8H   insulin aspart  0-9 Units Subcutaneous TID WC   insulin glargine-yfgn  12 Units Subcutaneous QHS   lactulose  20 g Oral TID   pantoprazole  40 mg Oral Daily   rifaximin  550 mg Oral BID   Continuous Infusions:  sodium chloride 70 mL/hr at 04/10/21 0730   cefTRIAXone (ROCEPHIN)  IV 1 g (04/09/21 1453)     LOS: 1 day   Time spent: 40 mins   Allyana Vogan Wynetta Emery, MD How to contact the [facility] Attending or Consulting provider [place] or covering provider during after hours [place], for this patient?  Check the care team in [facility] and look for a) attending/consulting  [place] provider listed and b) the [facility] team listed Log into [email or web address] and use [place]'s universal password to access. If you do not have the password, please contact the hospital operator. Locate the [facility] provider you are looking for under [facility] and page to a number that you can be directly reached. If you still have difficulty reaching the provider, please page the [facility] (Director on Call) for the Hospitalists listed on amion for assistance.  04/10/2021, 12:54 PM

## 2021-04-10 NOTE — Consult Note (Addendum)
Referring Provider: Murlean Iba, MD Primary Care Physician:  Rosine Door Primary Gastroenterologist:  [facility]  Reason for Consultation:  severe hepatic encephalopathy  HPI: Brittany Russo is a 76 y.o. female with past medical history significant for decompensated Nash cirrhosis, B12 deficiency/IDA/thrombocytopenia followed by hematology, heterozygous for MZ phenotype with normal alpha-1 antitrypsin level of 110 in 2019, diabetes mellitus, hypertension, history of recurrent endometrial cancer, right AKA, recurrent right pleural effusion status post thoracentesis January 6th who presented to the ED yesterday via [facility] with worsening altered mental status.  Recently diagnosed with UTI, on antibiotics for 3 days but family noted worsening mental status despite antibiotic therapy.  Patient diagnosed with UTI recently, starting antibiotics 2 to 3 days prior to admission.  She had been feeling slow and weak with altered mentation for 1 week.  Patient reportedly had not been taking her blood lactulose because she was not feeling well.  Also on Xifaxan 550 mg twice daily.  Because of no improvement, family called EMS.  EMS found patient lethargic, blood pressure 116/70, pulse 70.  In the ED: Ammonia level 72, lactic acid 2.9, sodium 133, BUN 34, creatinine 1.67 (baseline normal), glucose 156, albumin 2.4, AST 52, ALT 38, total bilirubin 2.0, white blood cell count 4400, hemoglobin 11.6, platelets 69,000 (baseline).  Urinalysis with large hemoglobin, positive nitrite, large leukocyte esterase, many bacteria seen..  Chest x-ray revealed right pleural effusion, interval increase since January 6 chest x-ray (post-thoracentesis).  Today: Ammonia level 108, creatinine 1.44, total bilirubin 1.6, AST 49.  Sodium 135.  White blood cell count 3700, platelets 51,000, hemoglobin 11.1.  INR 1.4.  Today: patient alert and oriented X 4. Son and daughter at bedside.  Confirmed history as outlined.   They also report that she is not fully aware of how advanced her liver disease is and they do not want her to necessarily know because they do not want her to worry.  Over the last several months they have noted increased episodes of encephalopathy.  Maybe a couple of times a month.  Patient admits to not taking her lactulose every day.  Recently has not had regular bowel movements either.  Has any blood in the stools or melena.  No vomiting.  Denies abdominal pain.  At some point family states that she came off the Xifaxan which was started at [facility].  States PCP put her back on lactulose, they feel like she was not able to afford Xifaxan.  No known episodes of SBP or variceal bleeding.  Varices noted on CT imaging in December.  Last EGD she believes was in 2016.  She has required frequent right thoracenteses.  Has required paracentesis in the past.  Last liver imaging in 03/03/2021 via CT abdomen/pelvis.  Large right pleural effusion with compressive atelectasis.  Cirrhosis with portal venous hypertension manifested by splenomegaly, ascites, extensive varices formation (marked splenic varices, smaller gastric and esophageal varices).  Portal vein patent.  Nonspecific wall thickening of the distal small bowel and proximal colon given the adjacent ascites.  Underlying inflammatory or infectious change difficult to exclude.  Normal appendix.  Currently scheduled for right upper quadrant ultrasound February 7 as an outpatient.   Colonoscopy December 2016, Dr. Doristine Mango: Report not available, path showed Sigmoid colon polyp, tubular adenoma.  EGD October 2020 by Dr. Watt Climes for IDA, EV screening.  - Normal larynx. - Small hiatal hernia. - A single gastric polyp. - Atrophic gastritis.  Flexible sigmoidoscopy in October 2020 the  doctor may guide for IDA, hematochezia with history of radiation proctitis - Stool in the mid sigmoid colon. No blood seen coming from above - Mucosal ulceration.  Treated with radiofrequency ablation. Patible with radiation damage - The examination was otherwise normal. - No specimens collected.   Prior to Admission medications   Medication Sig Start Date End Date Taking? Authorizing Provider  acetaminophen (TYLENOL) 325 MG tablet Take 1-2 tablets (325-650 mg total) by mouth every 4 (four) hours as needed for mild pain. 07/13/19  Yes Love, Ivan Anchors, PA-C  ALPRAZolam (XANAX) 0.25 MG tablet Take 1 tablet (0.25 mg total) by mouth 2 (two) times daily. 01/06/19  Yes Mariel Aloe, MD  atenolol (TENORMIN) 25 MG tablet Take 1 tablet (25 mg total) by mouth daily. 10/18/20  Yes Emokpae, Courage, MD  atorvastatin (LIPITOR) 80 MG tablet Take 80 mg by mouth daily at 8 pm.   Yes [provider]  buPROPion (WELLBUTRIN XL) 150 MG 24 hr tablet Take 150 mg by mouth in the morning.    Yes [provider]  cephALEXin (KEFLEX) 250 MG capsule Take 250 mg by mouth 3 (three) times daily. 04/07/21  Yes [provider]  escitalopram (LEXAPRO) 20 MG tablet Take 20 mg by mouth in the morning.  05/17/19  Yes [provider]  fluticasone (FLONASE) 50 MCG/ACT nasal spray Place 1 spray into both nostrils in the morning.   Yes [provider]  furosemide (LASIX) 20 MG tablet Take 1 tablet (20 mg total) by mouth daily. Patient taking differently: Take 40 mg by mouth daily. 10/18/20  Yes Emokpae, Courage, MD  insulin aspart (NOVOLOG FLEXPEN) 100 UNIT/ML FlexPen Inject 3-12 Units into the skin in the morning and at bedtime. Can be up to 8 units twice a day depending on blood sugar. Take mid afternoon only if bloodsugar  is over 180  Takes in evening if it is 200 or over give up to 10 units per daughter Sliding scale   Yes [provider]  Insulin Glargine (BASAGLAR KWIKPEN) 100 UNIT/ML Inject 20 Units into the skin See admin instructions. Inject 25 units if sugar is mid 100's.   Yes [provider]  lactulose (CHRONULAC) 10 GM/15ML  solution Take 45 mLs (30 g total) by mouth daily. 10/18/20  Yes Emokpae, Courage, MD  loratadine (CLARITIN) 10 MG tablet Take 10 mg by mouth in the morning.    Yes [provider]  mesalamine (CANASA) 1000 MG suppository Place 1 suppository rectally at bedtime.   Yes [provider]  Multiple Vitamin (MULTIVITAMIN WITH MINERALS) TABS tablet Take 1 tablet by mouth daily. 12/06/18  Yes Babish, Rodman Key, PA-C  pantoprazole (PROTONIX) 40 MG tablet Take 1 tablet (40 mg total) by mouth daily. 09/08/19  Yes Tat, Shanon Brow, MD  rifaximin (XIFAXAN) 550 MG TABS tablet Take 550 mg by mouth 2 (two) times daily.   Yes [provider]  sitaGLIPtin-metformin (JANUMET) 50-500 MG per tablet Take 1 tablet by mouth 2 (two) times daily with a meal.   Yes [provider]  spironolactone (ALDACTONE) 100 MG tablet Take 1 tablet (100 mg total) by mouth in the morning. 10/18/20  Yes Emokpae, Courage, MD  traZODone (DESYREL) 50 MG tablet Take 50 mg by mouth at bedtime as needed for sleep.  05/17/19  Yes [provider]  ferrous sulfate (FERROUSUL) 325 (65 FE) MG tablet Take 1 tablet (325 mg total) by mouth 2 (two) times daily with a meal. 10/18/20 03/05/21  Emokpae,  Courage, MD  vitamin B-12 (CYANOCOBALAMIN) 500 MCG tablet Take 1 tablet (500 mcg total) by mouth daily. Patient not taking: Reported on 03/05/2021 09/08/19   Tat, Shanon Brow, MD  Ibuprofen-Diphenhydramine Cit (ADVIL PM PO) Take 60 mg by mouth at bedtime.  05/21/17  [provider]    Current Facility-Administered Medications  Medication Dose Route Frequency Provider Last Rate Last Admin   0.9 %  sodium chloride infusion   Intravenous Continuous Johnson, Clanford L, MD       acetaminophen (TYLENOL) tablet 325-650 mg  325-650 mg Oral Q4H PRN Johnson, Clanford L, MD       atorvastatin (LIPITOR) tablet 80 mg  80 mg Oral QPM Johnson, Clanford L, MD       buPROPion (WELLBUTRIN XL) 24 hr tablet 150 mg  150 mg Oral q AM Johnson,  Clanford L, MD   150 mg at 04/10/21 0646   cefTRIAXone (ROCEPHIN) 1 g in sodium chloride 0.9 % 100 mL IVPB  1 g Intravenous Q24H Johnson, Clanford L, MD 200 mL/hr at 04/09/21 1453 1 g at 04/09/21 1453   escitalopram (LEXAPRO) tablet 20 mg  20 mg Oral q AM Johnson, Clanford L, MD   20 mg at 04/10/21 0646   heparin injection 5,000 Units  5,000 Units Subcutaneous Q8H Johnson, Clanford L, MD   5,000 Units at 04/10/21 0526   insulin aspart (novoLOG) injection 0-9 Units  0-9 Units Subcutaneous TID WC Johnson, Clanford L, MD   1 Units at 04/09/21 1837   insulin glargine-yfgn (SEMGLEE) injection 12 Units  12 Units Subcutaneous QHS Johnson, Clanford L, MD       lactulose (CHRONULAC) 10 GM/15ML solution 20 g  20 g Oral TID Wynetta Emery, Clanford L, MD   20 g at 04/09/21 2159   ondansetron (ZOFRAN) tablet 4 mg  4 mg Oral Q6H PRN Johnson, Clanford L, MD       Or   ondansetron (ZOFRAN) injection 4 mg  4 mg Intravenous Q6H PRN Johnson, Clanford L, MD       pantoprazole (PROTONIX) EC tablet 40 mg  40 mg Oral Daily Johnson, Clanford L, MD       rifaximin (XIFAXAN) tablet 550 mg  550 mg Oral BID Wynetta Emery, Clanford L, MD   550 mg at 04/09/21 2159    Allergies as of 04/09/2021 - Review Complete 04/09/2021  Allergen Reaction Noted   Asa [aspirin] Other (See Comments) 09/22/2018   Other Other (See Comments) 03/06/2013    Past Medical History:  Diagnosis Date   Acute and subacute hepatic failure without coma    Allergy    Anasarca    Anxiety    Arthritis    Ascites 09/2018   no SBP on 850 cc tap 10/13/18   Asthma    allery induced   Cirrhosis of liver ([place]) ~ 2004   from NAFLD.  Dx ~ 2004.  Dr Dorcas Mcmurray at [facility]/     Depression    Diabetes mellitus without complication [facility])    type 2   Eczema of both hands    Endometrial cancer ([place]) 1996, 2003   hysterectomy 1996.  recurrence 2003, treated with radiation.     Generalized edema    GI hemorrhage    Hematochezia 2009   radiation proctosigmoiditis on  colonoscopy 06/2007, DR Allyson Sabal Mir at [place] in [place]   Hyperlipidemia    Hypertension    Muscle weakness    Seizures ([place])    last seizure June 07, 2014  Thrombocytopenia ([place])    Thyroid nodule     Past Surgical History:  Procedure Laterality Date   AMPUTATION Right 06/29/2019   Procedure: AMPUTATION ABOVE KNEE through knee;  Surgeon: Paralee Cancel, MD;  Location: [facility];  Service: Orthopedics;  Laterality: Right;  2 hrs   CATARACT EXTRACTION W/PHACO Left 06/04/2017   Procedure: CATARACT EXTRACTION PHACO AND INTRAOCULAR LENS PLACEMENT (IOC);  Surgeon: Baruch Goldmann, MD;  Location: [facility];  Service: Ophthalmology;  Laterality: Left;  CDE: 3.90   CATARACT EXTRACTION W/PHACO Right 07/15/2017   Procedure: CATARACT EXTRACTION PHACO AND INTRAOCULAR LENS PLACEMENT (IOC);  Surgeon: Baruch Goldmann, MD;  Location: [facility];  Service: Ophthalmology;  Laterality: Right;  CDE: 7.60   COLONOSCOPY  06/2007   in [place]. for hematochzia.  radiation proctitis   DILATION AND CURETTAGE OF UTERUS     ESOPHAGOGASTRODUODENOSCOPY  2016   ESOPHAGOGASTRODUODENOSCOPY (EGD) WITH PROPOFOL N/A 12/30/2018   Procedure: EGD;  Surgeon: Clarene Essex, MD;  Location: [facility];  Service: Endoscopy;  Laterality: N/A;   EXCISIONAL TOTAL KNEE ARTHROPLASTY Right 01/03/2019   Procedure: EXCISIONAL TOTAL KNEE ARTHROPLASTY;  Surgeon: Paralee Cancel, MD;  Location: [facility];  Service: Orthopedics;  Laterality: Right;   FLEXIBLE SIGMOIDOSCOPY N/A 12/30/2018   Procedure: FLEXIBLE SIGMOIDOSCOPY;  Surgeon: Clarene Essex, MD;  Location: [facility];  Service: Endoscopy;  Laterality: N/A;   IR PARACENTESIS  10/13/2018   IR PARACENTESIS  07/21/2019   IR THORACENTESIS ASP PLEURAL SPACE W/IMG GUIDE  07/19/2019   STERIOD INJECTION Left 06/29/2019   Procedure: LEFT KNEE INJECTION;  Surgeon: Paralee Cancel, MD;  Location: [facility];  Service: Orthopedics;  Laterality: Left;   TOTAL ABDOMINAL HYSTERECTOMY  1996   TOTAL KNEE ARTHROPLASTY Right  09/22/2018   Procedure: TOTAL KNEE ARTHROPLASTY;  Surgeon: Paralee Cancel, MD;  Location: [facility];  Service: Orthopedics;  Laterality: Right;  70 mins   TOTAL KNEE REVISION Right 09/27/2018   Procedure: TOTAL KNEE REVISION;  Surgeon: Paralee Cancel, MD;  Location: [facility];  Service: Orthopedics;  Laterality: Right;   TOTAL KNEE REVISION Right 11/22/2018   Procedure: repair right knee extensor mechanism;  Surgeon: Paralee Cancel, MD;  Location: [facility];  Service: Orthopedics;  Laterality: Right;  90 mins    Family History  Problem Relation Age of Onset   Heart disease Mother    Diabetes Mother    Heart disease Father    Cancer Father    Cancer Maternal Grandmother    Heart disease Maternal Grandfather     Social History   Socioeconomic History   Marital status: Married    Spouse name: Not on file   Number of children: Not on file   Years of education: Not on file   Highest education level: Not on file  Occupational History   Not on file  Tobacco Use   Smoking status: Never   Smokeless tobacco: Never  Vaping Use   Vaping Use: Never used  Substance and Sexual Activity   Alcohol use: No   Drug use: No   Sexual activity: Not Currently  Other Topics Concern   Not on file  Social History Narrative   Not on file   Social Determinants of Health   Financial Resource Strain: Not on file  Food Insecurity: Not on file  Transportation Needs: Not on file  Physical Activity: Not on file  Stress: Not on file  Social Connections: Not on file  Intimate Partner Violence: Not on file     ROS:  General:  Negative for anorexia, weight loss, fever, chills, fatigue, +weakness. Eyes: Negative for vision changes.  ENT: Negative for hoarseness, difficulty swallowing , nasal congestion. CV: Negative for chest pain, angina, palpitations, dyspnea on exertion, peripheral edema.  Respiratory: Negative for dyspnea at rest, dyspnea on exertion, cough, sputum, wheezing.  GI: See history of present  illness. GU:  Negative for dysuria, hematuria, urinary incontinence, urinary frequency, nocturnal urination.  MS: Negative for joint pain, low back pain.  Derm: Negative for rash or itching.  Neuro: Negative for weakness, abnormal sensation, seizure, frequent headaches, memory loss, +confusion.  Psych: Negative for anxiety, depression, suicidal ideation, hallucinations.  Endo: Negative for unusual weight change.  Heme: Negative for bruising or bleeding. Allergy: Negative for rash or hives.       Physical Examination: Vital signs in last 24 hours: Temp:  [97.4 F (36.3 C)-97.9 F (36.6 C)] 97.8 F (36.6 C) (01/19 0510) Pulse Rate:  [59-65] 59 (01/19 0510) Resp:  [12-19] 19 (01/19 0510) BP: (99-141)/(50-64) 133/60 (01/19 0510) SpO2:  [93 %-99 %] 99 % (01/19 0510) FiO2 (%):  [21 %] 21 % (01/18 1411) Weight:  [63.5 kg] 63.5 kg (01/18 1041) Last BM Date: 04/09/21  General: Chronically ill-appearing female in no acute distress.   Head: Normocephalic, atraumatic.   Eyes: Conjunctiva pink, no icterus. Mouth: Oropharyngeal mucosa moist and pink , no lesions erythema or exudate. Neck: Supple without thyromegaly, masses, or lymphadenopathy.  Lungs: Clear to auscultation bilaterally.  Heart: Regular rate and rhythm, no murmurs rubs or gallops.  Abdomen: Bowel sounds are normal, nontender, nondistended, no hepatosplenomegaly or masses, no abdominal bruits or    hernia , no rebound or guarding.   Rectal: Not performed Extremities: No left lower extremity edema, clubbing, deformity.  Right AKA. Neuro: Alert and oriented x 4 , grossly normal neurologically.  Negative asterixis. Skin: Warm and dry, no rash or jaundice.   Psych: Alert and cooperative, normal mood and affect.        Intake/Output from previous day: 01/18 0701 - 01/19 0700 In: 1300.5 [P.O.:240; I.V.:461.7; IV Piggyback:598.9] Out: 200 [Urine:200] Intake/Output this shift: No intake/output data recorded.  Lab  Results: CBC Recent Labs    04/09/21 1116 04/10/21 0548  WBC 4.4 3.7*  HGB 11.6* 11.1*  HCT 35.3* 35.2*  MCV 96.2 100.0  PLT 69* 51*   BMET Recent Labs    04/09/21 1116 04/10/21 0548  NA 133* 135  K 4.1 3.5  CL 95* 102  CO2 25 22  GLUCOSE 156* 106*  BUN 34* 29*  CREATININE 1.67* 1.44*  CALCIUM 8.5* 8.1*   LFT Recent Labs    04/09/21 1116 04/10/21 0548  BILITOT 2.0* 1.6*  ALKPHOS 94 81  AST 52* 49*  ALT 38 35  PROT 5.8* 5.4*  ALBUMIN 2.4* 2.3*    Lipase No results for input(s): LIPASE in the last 72 hours.  PT/INR Recent Labs    04/10/21 0548  LABPROT 16.7*  INR 1.4*      Imaging Studies: DG Chest 1 View  Result Date: 03/28/2021 CLINICAL DATA:  Cirrhosis, RIGHT pleural effusion post thoracentesis EXAM: CHEST  1 VIEW COMPARISON:  Expiratory AP exam compared to 03/27/2021 FINDINGS: Enlargement of cardiac silhouette. Significant decrease in RIGHT pleural effusion since previous exam with residual effusion atelectasis at RIGHT base. No pneumothorax. Remaining lungs clear. Bones demineralized. IMPRESSION: No pneumothorax following RIGHT thoracentesis. Electronically Signed   By: Lavonia Dana M.D.   On: 03/28/2021 16:35   DG Chest [facility]  Result Date: 04/09/2021 CLINICAL DATA:  Shortness of breath EXAM: PORTABLE CHEST 1 VIEW COMPARISON:  Previous studies including the examination of 03/28/2021 FINDINGS: Transverse diameter of heart is increased. There are no signs of alveolar pulmonary edema. There is increased haziness in the right mid and right lower lung fields suggesting increase in amount of right pleural effusion. Left lateral CP angle is clear. There is no pneumothorax. IMPRESSION: Cardiomegaly. There is interval increase in amount of right pleural effusion. There is no pneumothorax. Electronically Signed   By: Elmer Picker M.D.   On: 04/09/2021 13:18   US THORACENTESIS ASP PLEURAL SPACE W/IMG GUIDE  Result Date: 03/28/2021 INDICATION: Shortness  of breath, recurrent RIGHT pleural effusion, pleural effusion associated with cirrhosis EXAM: ULTRASOUND GUIDED THERAPEUTIC RIGHT THORACENTESIS MEDICATIONS: None. COMPLICATIONS: None immediate. PROCEDURE: An ultrasound guided thoracentesis was thoroughly discussed with the patient and questions answered. The benefits, risks, alternatives and complications were also discussed. The patient understands and wishes to proceed with the procedure. Written consent was obtained. Ultrasound was performed to localize and mark an adequate pocket of fluid in the RIGHT chest. The area was then prepped and draped in the normal sterile fashion. 1% Lidocaine was used for local anesthesia. Under ultrasound guidance a 8 French thoracentesis catheter was introduced. Thoracentesis was performed. The catheter was removed and a dressing applied. FINDINGS: A total of approximately 1.55 L of clear yellow RIGHT pleural fluid was removed. IMPRESSION: Successful ultrasound guided RIGHT thoracentesis yielding 1.55 L of pleural fluid. Electronically Signed   By: Lavonia Dana M.D.   On: 03/28/2021 16:33  [4 week]   Impression: 76 year old female with history of decompensated [name] cirrhosis complicated by recurrent hepatic encephalopathy, recurrent right pleural effusion requiring thoracentesis, thrombocytopenia, diabetes, hypertension presenting with altered mental status due to acute hepatic encephalopathy.  GI consulted for acute hepatic encephalopathy.  Hepatic encephalopathy: Recurrent episodes 1-2 times per month, often treated at home with increased lactulose.  Patient with altered mental status in the setting of UTI, decreased oral intake, poor compliance with oral lactulose.  She reports bowel movements have been infrequent recently.  She does not always take lactulose but last week she felt poorly and did not take it.  She has had episodes of encephalopathy in the past, did much better when she was on Xifaxan but daughter states  that she is not on this currently she believes was because of cost.  Fortunately her mental status is much improved after receiving lactulose enemas yesterday.  She is also taking Xifaxan 550mg  BID this admission.  Suspect HE precipitated by combination of infrequent stooling, dehydration, infection, poor compliance with lactulose.  NASH cirrhosis: With decompensation. MELD Na 17.  Complicated by recurrent right pleural effusion requiring several thoracenteses.  She has had paracenteses in the past.  No significant lower extremity edema.  Diuretics are on hold.  Creatinine remains elevated, although improved somewhat. HE resolved. Patient's son/daughter with lots of questions regarding life expectancy and future trajectory of her liver disease.  Discussed MELD score, meaning of this number.  Offered palliative consult, they want to discuss with all siblings prior to making a decision.  They are very interested in not discussing full extent of her liver disease in front of the patient.  Thrombocytopenia: Chronic, due to chronic liver disease.  At baseline.  Anemia: History of B12 and iron deficiency followed by hematology.  No overt GI bleeding.  History of radiation proctitis on flexible sigmoidoscopy in 2020 status post ablation.  EGD in 2020  with no evidence of varices.  CT in December with evidence of gastric and esophageal varices.  Plan: Lactulose to maintain 2-3 soft stools daily. Xifaxan 550mg  BID.  Trend LFTs/INR. Monitor for decline in H&H, overt GI bleeding. Continue Rocephin for UTI. Would not recommend daily ammonia levels. Follow clinically.  We would like to thank you for the opportunity to participate in the care of Brittany Russo.  Laureen Ochs. [facility] [phone] 1/19/20235:47 PM    LOS: 1 day

## 2021-04-10 NOTE — Evaluation (Signed)
Clinical/Bedside Swallow Evaluation Patient Details  Name: Brittany Russo MRN: [id number] Date of Birth: 1945-07-30  Today's Date: 04/10/2021 Time: SLP Start Time (ACUTE ONLY): 1545 SLP Stop Time (ACUTE ONLY): 1609 SLP Time Calculation (min) (ACUTE ONLY): 24 min  Past Medical History:  Past Medical History:  Diagnosis Date   Acute and subacute hepatic failure without coma    Allergy    Anasarca    Anxiety    Arthritis    Ascites 09/2018   no SBP on 850 cc tap 10/13/18   Asthma    allery induced   Cirrhosis of liver ([place]) ~ 2004   from NAFLD.  Dx ~ 2004.  Dr Dorcas Mcmurray at [facility]/     Depression    Diabetes mellitus without complication [facility])    type 2   Eczema of both hands    Endometrial cancer ([place]) 1996, 2003   hysterectomy 1996.  recurrence 2003, treated with radiation.     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, DR Allyson Sabal Mir at [place] in [place]   Hyperlipidemia    Hypertension    Muscle weakness    Seizures ([place])    last seizure June 07, 2014    Thrombocytopenia [facility])    Thyroid nodule    Past Surgical History:  Past Surgical History:  Procedure Laterality Date   AMPUTATION Right 06/29/2019   Procedure: AMPUTATION ABOVE KNEE through knee;  Surgeon: Paralee Cancel, MD;  Location: [facility];  Service: Orthopedics;  Laterality: Right;  2 hrs   CATARACT EXTRACTION W/PHACO Left 06/04/2017   Procedure: CATARACT EXTRACTION PHACO AND INTRAOCULAR LENS PLACEMENT (IOC);  Surgeon: Baruch Goldmann, MD;  Location: [facility];  Service: Ophthalmology;  Laterality: Left;  CDE: 3.90   CATARACT EXTRACTION W/PHACO Right 07/15/2017   Procedure: CATARACT EXTRACTION PHACO AND INTRAOCULAR LENS PLACEMENT (IOC);  Surgeon: Baruch Goldmann, MD;  Location: [facility];  Service: Ophthalmology;  Laterality: Right;  CDE: 7.60   COLONOSCOPY  06/2007   in [place]. for hematochzia.  radiation proctitis   DILATION AND CURETTAGE OF UTERUS      ESOPHAGOGASTRODUODENOSCOPY  2016   ESOPHAGOGASTRODUODENOSCOPY (EGD) WITH PROPOFOL N/A 12/30/2018   Procedure: EGD;  Surgeon: Clarene Essex, MD;  [facility]: [facility];  Service: Endoscopy;  Laterality: N/A;   EXCISIONAL TOTAL KNEE ARTHROPLASTY Right 01/03/2019   Procedure: EXCISIONAL TOTAL KNEE ARTHROPLASTY;  Surgeon: Paralee Cancel, MD;  Location: [facility];  Service: Orthopedics;  Laterality: Right;   FLEXIBLE SIGMOIDOSCOPY N/A 12/30/2018   Procedure: FLEXIBLE SIGMOIDOSCOPY;  Surgeon: Clarene Essex, MD;  [facility];  Service: Endoscopy;  Laterality: N/A;   IR PARACENTESIS  10/13/2018   IR PARACENTESIS  07/21/2019   IR THORACENTESIS ASP PLEURAL SPACE W/IMG GUIDE  07/19/2019   STERIOD INJECTION Left 06/29/2019   Procedure: LEFT KNEE INJECTION;  Surgeon: Paralee Cancel, MD;  [facility]: [facility];  Service: Orthopedics;  Laterality: Left;   TOTAL ABDOMINAL HYSTERECTOMY  1996   TOTAL KNEE ARTHROPLASTY Right 09/22/2018   Procedure: TOTAL KNEE ARTHROPLASTY;  Surgeon: Paralee Cancel, MD;  [facility]: [facility];  Service: Orthopedics;  Laterality: Right;  70 mins   TOTAL KNEE REVISION Right 09/27/2018   Procedure: TOTAL KNEE REVISION;  Surgeon: Paralee Cancel, MD;  [facility]: [facility];  Service: Orthopedics;  Laterality: Right;   TOTAL KNEE REVISION Right 11/22/2018   Procedure: repair right knee extensor mechanism;  Surgeon: Paralee Cancel, MD;  Location: [facility];  Service:  Orthopedics;  Laterality: Right;  90 mins   HPI:  Brittany Russo is a 76 y.o. female with medical history significant for liver cirrhosis, recurrent right pleural effusion status post recent thoracentesis 08/21/4429, nonalcoholic fatty liver disease, anasarca, ascites, anxiety, GERD prior treatments for hepatic encephalopathy, thrombocytopenia, history of endometrial cancer status post radiation treatments, history of GI hemorrhage, history of radiation proctosigmoiditis, hyperlipidemia, hypertension, remote history of seizures who reportedly has been  feeling slow and weak with altered mentation over the past week.  Has not taken her lactulose because of not feeling well.  Was diagnosed with a UTI recently and antibiotics started 2 days ago.  Continued to have symptoms of lethargic and weakness and brought to the emergency department.  Family reports patient was told that she was at stage IV in terms of her cirrhosis diagnosis. Chest x-ray reveals right pleural effusion which is been chronic. BSE requested.    Assessment / Plan / Recommendation  Clinical Impression  Clinical swallow evaluation completed at bedside with Pt's daughter present. The daughter reports that Pt "got choked" on a boiled egg recently, but has no difficulties with liquids. She also then indicated that Pt has had several dental extractions and will eventually have all of her teeth removed. Pt was sensitive to SLP touch on upper gum, although denies pain with mastication. Pt assessed with ice chips, water, puree, mech soft, and regular textures and shows no overt signs or symptoms of aspiration. Suspect that Pt has not been chewing her solid foods as thoroughly due to dental extractions and oral sensitivity. SLP reiterated the importance of chewing her foods thoroughly ("until they become the consistency of applesauce") before she attempts to swallow. Her daughter now thinks that her dental extractions may be the culprit for recent difficulty swallowing solid foods. Recommend D3/mech soft and thin liquids with standard aspiration and reflux precautions. No further SLP services indicated at this time.   SLP Visit Diagnosis: Dysphagia, unspecified (R13.10)    Aspiration Risk  Mild aspiration risk    Diet Recommendation Dysphagia 3 (Mech soft);Thin liquid   Liquid Administration via: Cup;Straw Medication Administration: Whole meds with liquid Supervision: Patient able to self feed;Staff to assist with self feeding Compensations: Slow rate (masticate solids thoroughly) Postural  Changes: Seated upright at 90 degrees;Remain upright for at least 30 minutes after po intake    Other  Recommendations Oral Care Recommendations: Oral care BID Other Recommendations: Clarify dietary restrictions    Recommendations for follow up therapy are one component of a multi-disciplinary discharge planning process, led by the attending physician.  Recommendations may be updated based on patient status, additional functional criteria and insurance authorization.  Follow up Recommendations No SLP follow up      Assistance Recommended at Discharge    Functional Status Assessment Patient has not had a recent decline in their functional status  Frequency and Duration            Prognosis Prognosis for Safe Diet Advancement: Good Barriers/Prognosis Comment: dental extractions      Swallow Study   General Date of Onset: 04/09/21 HPI: Brittany Russo is a 76 y.o. female with medical history significant for liver cirrhosis, recurrent right pleural effusion status post recent thoracentesis 07/24/84, nonalcoholic fatty liver disease, anasarca, ascites, anxiety, GERD prior treatments for hepatic encephalopathy, thrombocytopenia, history of endometrial cancer status post radiation treatments, history of GI hemorrhage, history of radiation proctosigmoiditis, hyperlipidemia, hypertension, remote history of seizures who reportedly has been feeling slow and weak with  altered mentation over the past week.  Has not taken her lactulose because of not feeling well.  Was diagnosed with a UTI recently and antibiotics started 2 days ago.  Continued to have symptoms of lethargic and weakness and brought to the emergency department.  Family reports patient was told that she was at stage IV in terms of her cirrhosis diagnosis. Chest x-ray reveals right pleural effusion which is been chronic. BSE requested. Type of Study: Bedside Swallow Evaluation Diet Prior to this Study: Dysphagia 2 (chopped);Thin  liquids Temperature Spikes Noted: No Respiratory Status: Room air History of Recent Intubation: No Behavior/Cognition: Alert;Cooperative;Pleasant mood Oral Cavity Assessment: Within Functional Limits Oral Care Completed by SLP: No Oral Cavity - Dentition: Missing dentition (recently had some teeth pulled and will have all extracted eventually) Vision: Functional for self-feeding Self-Feeding Abilities: Able to feed self;Needs set up Patient Positioning: Upright in bed Baseline Vocal Quality: Normal;Low vocal intensity Volitional Cough: Strong Volitional Swallow: Able to elicit    Oral/Motor/Sensory Function Overall Oral Motor/Sensory Function: Within functional limits   Ice Chips Ice chips: Within functional limits Presentation: Spoon   Thin Liquid Thin Liquid: Within functional limits Presentation: Straw;Self Fed    Nectar Thick Nectar Thick Liquid: Not tested   Honey Thick Honey Thick Liquid: Not tested   Puree Puree: Within functional limits Presentation: Spoon   Solid     Solid: Within functional limits Presentation: Self Fed     Thank you,  Genene Churn, [place] 04/10/2021,4:32 PM

## 2021-04-10 NOTE — Progress Notes (Signed)
Initial Nutrition Assessment  DOCUMENTATION CODES:      INTERVENTION:  Check Vitamin D and Zinc levels   Nectar thick milk each meal  NUTRITION DIAGNOSIS:   Increased nutrient needs related to catabolic illness, chronic illness (cirrhosis) as evidenced by estimated needs.   GOAL:  Patient will meet greater than or equal to 90% of their needs   MONITOR:  PO intake, Labs, Weight trends, Supplement acceptance  REASON FOR ASSESSMENT:   Malnutrition Screening Tool    ASSESSMENT: Patient is a 76 yo female with hx of recurrent right PE, cirrhosis (stage IV), NASH, anasarca, ascites. Right AKA (2021). Presents with altered mental status.  US Thoracentesis 1/6 - 1.55 liter clear yellow fluid removed.  Husband and granddaughter are bedside. Patient is resting. Persisting poor appetite per spouse taking only bites at meals. She drinks 120 ml Glucerna daily (110 kcal, 5 gr protein).    Currently Dysphagia 2 with nectar thick liquids. Fed by family today to encourage intake.   PO: 65% breakfast. Husband is primary caregiver. We talked about ways to increase nutrient density without adding volume.  Emphasized the importance protein and of eating 4-6 small meals daily to help maintain lean body mass.   Patient is high risk for malnutrition due to Stage IV cirrhosis.   Weight has been stable at 61-64 kg since July 2022. Currently 63.5 kg.    Intake/Output Summary (Last 24 hours) at 04/10/2021 1453 Last data filed at 04/10/2021 0900 Gross per 24 hour  Intake 1100.51 ml  Output 200 ml  Net 900.51 ml    Patient weight ranging between 61-64 kg the past 6 months.   Medications: lipitor, Insulin, Lactulose, Protonix  IVF-NS@ 70 ml/hr- stop @ 0814- 1/20.  Labs: BMP Latest Ref Rng & Units 04/10/2021 04/09/2021 03/05/2021  Glucose 70 - 99 mg/dL 106(H) 156(H) 215(H)  BUN 8 - 23 mg/dL 29(H) 34(H) 18  Creatinine 0.44 - 1.00 mg/dL 1.44(H) 1.67(H) 0.99  Sodium 135 - 145 mmol/L 135 133(L)  134(L)  Potassium 3.5 - 5.1 mmol/L 3.5 4.1 4.3  Chloride 98 - 111 mmol/L 102 95(L) 100  CO2 22 - 32 mmol/L 22 25 24   Calcium 8.9 - 10.3 mg/dL 8.1(L) 8.5(L) 8.4(L)      NUTRITION - FOCUSED PHYSICAL EXAM: Nutrition-Focused physical exam completed. Findings are no fat depletion, mild temporal, clavicle muscle depletion, and no edema LLE.     Diet Order:   Diet Order             DIET DYS 2 Room service appropriate? Yes; Fluid consistency: Nectar Thick  Diet effective now                   EDUCATION NEEDS:  Education needs have been addressed  Skin:  Skin Assessment: Reviewed RN Assessment  Last BM:  1/19- large type 7  Height:   Ht Readings from Last 1 Encounters:  04/09/21 5\' 2"  (1.575 m)    Weight:   Wt Readings from Last 1 Encounters:  04/09/21 63.5 kg    Adjusted Ideal Body Weight:   46 kg  BMI:  Body mass index is 25.6 kg/m.  Estimated Nutritional Needs:   Kcal:  1800-1900  Protein:  82-89 gr  Fluid:  1600 ml daily  Colman Cater MS,RD,CSG,LDN Contact: Shea Evans

## 2021-04-11 ENCOUNTER — Other Ambulatory Visit: Payer: Self-pay

## 2021-04-11 DIAGNOSIS — R627 Adult failure to thrive: Secondary | ICD-10-CM

## 2021-04-11 LAB — COMPREHENSIVE METABOLIC PANEL
ALT: 37 U/L (ref 0–44)
AST: 54 U/L — ABNORMAL HIGH (ref 15–41)
Albumin: 2.1 g/dL — ABNORMAL LOW (ref 3.5–5.0)
Alkaline Phosphatase: 77 U/L (ref 38–126)
Anion gap: 10 (ref 5–15)
BUN: 22 mg/dL (ref 8–23)
CO2: 20 mmol/L — ABNORMAL LOW (ref 22–32)
Calcium: 8.1 mg/dL — ABNORMAL LOW (ref 8.9–10.3)
Chloride: 106 mmol/L (ref 98–111)
Creatinine, Ser: 1.24 mg/dL — ABNORMAL HIGH (ref 0.44–1.00)
GFR, Estimated: 46 mL/min — ABNORMAL LOW (ref >60)
Glucose, Bld: 164 mg/dL — ABNORMAL HIGH (ref 70–99)
Potassium: 3.1 mmol/L — ABNORMAL LOW (ref 3.5–5.1)
Sodium: 136 mmol/L (ref 135–145)
Total Bilirubin: 1.2 mg/dL (ref 0.3–1.2)
Total Protein: 5.2 g/dL — ABNORMAL LOW (ref 6.5–8.1)

## 2021-04-11 LAB — CBC
HCT: 33.9 % — ABNORMAL LOW (ref 36.0–46.0)
Hemoglobin: 10.9 g/dL — ABNORMAL LOW (ref 12.0–15.0)
MCH: 32.5 pg (ref 26.0–34.0)
MCHC: 32.2 g/dL (ref 30.0–36.0)
MCV: 101.2 fL — ABNORMAL HIGH (ref 80.0–100.0)
Platelets: 46 10*3/uL — ABNORMAL LOW (ref 150–400)
RBC: 3.35 MIL/uL — ABNORMAL LOW (ref 3.87–5.11)
RDW: 17.2 % — ABNORMAL HIGH (ref 11.5–15.5)
WBC: 3.7 10*3/uL — ABNORMAL LOW (ref 4.0–10.5)
nRBC: 0 % (ref 0.0–0.2)

## 2021-04-11 LAB — GLUCOSE, CAPILLARY
Glucose-Capillary: 172 mg/dL — ABNORMAL HIGH (ref 70–99)
Glucose-Capillary: 166 mg/dL — ABNORMAL HIGH (ref 70–99)
Glucose-Capillary: 174 mg/dL — ABNORMAL HIGH (ref 70–99)
Glucose-Capillary: 168 mg/dL — ABNORMAL HIGH (ref 70–99)

## 2021-04-11 LAB — AMMONIA: Ammonia: 41 umol/L — ABNORMAL HIGH (ref 9–35)

## 2021-04-11 LAB — MAGNESIUM: Magnesium: 1.9 mg/dL (ref 1.7–2.4)

## 2021-04-11 LAB — PROTIME-INR
INR: 1.4 — ABNORMAL HIGH (ref 0.8–1.2)
Prothrombin Time: 17.4 seconds — ABNORMAL HIGH (ref 11.4–15.2)

## 2021-04-11 MED ORDER — POTASSIUM CHLORIDE 20 MEQ PO PACK
40.0000 meq | PACK | Freq: Once | ORAL | Status: AC
  Administered 2021-04-11: 40 meq via ORAL
  Filled 2021-04-11: qty 2

## 2021-04-11 MED ORDER — BASAGLAR KWIKPEN 100 UNIT/ML ~~LOC~~ SOPN: 12.0000 [IU] | PEN_INJECTOR | Freq: Every day | SUBCUTANEOUS | Status: DC

## 2021-04-11 MED ORDER — MORPHINE SULFATE (PF) 4 MG/ML IV SOLN
4.0000 mg | Freq: Once | INTRAVENOUS | Status: AC
  Administered 2021-04-11: 4 mg via INTRAVENOUS
  Filled 2021-04-11: qty 1

## 2021-04-11 MED ORDER — ALPRAZOLAM 0.25 MG PO TABS: 0.2500 mg | ORAL_TABLET | Freq: Every day | ORAL | 0 refills | Status: DC | PRN

## 2021-04-11 MED ORDER — LACTULOSE 10 GM/15ML PO SOLN: 10.0000 g | Freq: Three times a day (TID) | ORAL | Status: DC

## 2021-04-11 MED ORDER — LACTULOSE 10 GM/15ML PO SOLN: 10.0000 g | Freq: Three times a day (TID) | ORAL | 1 refills | Status: AC

## 2021-04-11 MED ORDER — SPIRONOLACTONE 100 MG PO TABS: 50.0000 mg | ORAL_TABLET | Freq: Every morning | ORAL | 3 refills | Status: AC

## 2021-04-11 MED ORDER — CEFDINIR 300 MG PO CAPS
300.0000 mg | ORAL_CAPSULE | Freq: Two times a day (BID) | ORAL | 0 refills | Status: AC
Start: 2021-04-11 — End: 2021-04-13

## 2021-04-11 NOTE — TOC Transition Note (Signed)
Transition of Care [facility]) - CM/SW Discharge Note   Patient Details  Name: Brittany Russo MRN: [id number] Date of Birth: Jan 27, 1946  Transition of Care [facility] CM/SW Contact:  Ihor Gully, LCSW Phone Number: 04/11/2021, 1:53 PM   Clinical Narrative:    Patient has CAP aide from 10-1. Daughter, [name], is attempting to have hours increased. Requests HH RN and aide. Standard providers reviewed. Referral made and accepted by [facility].    Final next level of care: [place] Barriers to Discharge: No Barriers Identified   Patient Goals and CMS Choice Patient states their goals for this hospitalization and ongoing recovery are:: home with [facility]      Discharge Placement                       Discharge Plan and Services                          HH Arranged: RN, Nurse's Aide [facility] Agency: [place] ([facility]) Date HH Agency Contacted: 04/11/21 Time [place]: 1353 Representative spoke with at [place]: [place] ([place]) Interventions     Readmission Risk Interventions No flowsheet data found.

## 2021-04-11 NOTE — Discharge Summary (Addendum)
Physician Discharge Summary  Brittany Russo RFF:[id number] DOB: 1945-04-17 DOA: 04/09/2021  PCP: Rosalee Kaufman, PA-C [facility] - Dr. Therisa Doyne  Admit date: 04/09/2021 Discharge date: 04/11/2021  Admitted From:   Home  Disposition:  Home with [facility]   Recommendations for Outpatient Follow-up:  Follow up with PCP in 1 weeks Follow up with Dr. Therisa Doyne in 1-2 weeks  Strongly recommend outpatient palliative care consultation   Home Health: RN, Aide   Discharge Condition: STABLE BUT GUARDED   CODE STATUS: FULL DIET: Dysphagia 3 diet   Brief Hospitalization Summary: Please see all hospital notes, images, labs for full details of the hospitalization. Brief Admission History:  76 y.o. female with medical history significant for liver cirrhosis, recurrent right pleural effusion status post recent thoracentesis 05/26/7015, nonalcoholic fatty liver disease, anasarca, ascites, anxiety, GERD prior treatments for hepatic encephalopathy, thrombocytopenia, history of endometrial cancer status post radiation treatments, history of GI hemorrhage, history of radiation proctosigmoiditis, hyperlipidemia, hypertension, remote history of seizures who reportedly has been feeling slow and weak with altered mentation over the past week.  Has not taken her lactulose because of not feeling well.  Was diagnosed with a UTI recently and antibiotics started 2 days ago.  Continued to have symptoms of lethargic and weakness and brought to the emergency department.  Family reports patient was told that she was at stage IV in terms of her cirrhosis diagnosis.   ED Course: EMS found patient lethargic, BP 116/70, heart rate 70, CBG 179, pulse ox 97% on room air, ammonia elevated at 72, lactic acid 2.9, BUN 34, creatinine 1.67, glucose 156, calcium 8.5, albumin 2.4, AST 52, ALT 38, total protein 5.8, total bilirubin 2.0, WBC 4.4, hemoglobin 11.6, platelet count 69.  Chest x-ray reveals right pleural effusion which is been chronic.   Lactulose enema ordered and admission requested.  Urinalysis suggesting infection.   Assessment & Plan:   Principal Problem:   Acute hepatic encephalopathy Active Problems:   DM2 (diabetes mellitus, type 2) (HCC)   Hypertension   Hyperlipidemia   S/P right TKA   Cirrhosis ([place])   Hypoalbuminemia   AKI (acute kidney injury) ([place])   Hyperglycemia   Thrombocytopenia (HCC)   Phantom limb pain (HCC)   Hyperammonemia (HCC)   Transaminitis   Elevated INR   Recurrent right pleural effusion   Acute hepatic encephalopathy - TREATED AND RESOLVED   -Patient presented with hyperammonemia, which persists despite lactulose administration -Poor compliance with taking oral lactulose in last week per family -Continue lactulose and rifaximin with goal of 3 bowel movements per day -Prognosis guarded. Recommend outpatient palliative care consultation (not available at [facility] at this time)  Liver cirrhosis from NAFLD - pt remains on rifaximin  - lactulose ordered - temporarily held diuretics and resumed at lower dose - PT/INR has been stable    AKI - treated and improved - prerenal from poor oral intake and diuretics - Gentle IV fluid hydration given - reduced dose of diuretics  Anemia in chronic disease - no evidence of GI bleeding - Hg stable    UTI - recently started outpatient treatments - IV ceftriaxone given in hospital    Depression in chronic disease - resumed home meds   Thrombocytopenia - from chronic liver disease - holding heparin products - SCDs for DVT prophylaxis   Hypokalemia - repleted    DVT prophylaxis: SCDs Code Status: FULL  Family Communication: daughter at bedside  Disposition: home   Discharge Diagnoses:  Principal Problem:  Acute hepatic encephalopathy Active Problems:   Cirrhosis (HCC)   AKI (acute kidney injury) ([place])   Thrombocytopenia (HCC)   Hyperammonemia (HCC)   Failure to thrive in adult   DM2 (diabetes mellitus, type 2) ([place])    Hypertension   Hyperlipidemia   S/P right TKA   Hypoalbuminemia   Hyperglycemia   Phantom limb pain (HCC)   Transaminitis   Elevated INR   Recurrent right pleural effusion   Discharge Instructions: Discharge Instructions     Ambulatory referral to Gastroenterology   Complete by: As directed    Hospital follow up for hepatic encephalopathy      Allergies as of 04/11/2021       Reactions   Asa [aspirin] Other (See Comments)   Pt not allergic but cannot take due to bleeding    Other Other (See Comments)   Any jewelry metal-hives and itching         Medication List     STOP taking these medications    cephALEXin 250 MG capsule Commonly known as: KEFLEX   loratadine 10 MG tablet Commonly known as: CLARITIN   vitamin B-12 500 MCG tablet Commonly known as: CYANOCOBALAMIN       TAKE these medications    acetaminophen 325 MG tablet Commonly known as: TYLENOL Take 1-2 tablets (325-650 mg total) by mouth every 4 (four) hours as needed for mild pain.   ALPRAZolam 0.25 MG tablet Commonly known as: XANAX Take 1 tablet (0.25 mg total) by mouth daily as needed for anxiety. What changed:  when to take this reasons to take this   atenolol 25 MG tablet Commonly known as: TENORMIN Take 1 tablet (25 mg total) by mouth daily.   atorvastatin 80 MG tablet Commonly known as: LIPITOR Take 80 mg by mouth daily at 8 pm.   Basaglar KwikPen 100 UNIT/ML Inject 12 Units into the skin daily. What changed:  how much to take when to take this additional instructions   buPROPion 150 MG 24 hr tablet Commonly known as: WELLBUTRIN XL Take 150 mg by mouth in the morning.   cefdinir 300 MG capsule Commonly known as: OMNICEF Take 1 capsule (300 mg total) by mouth 2 (two) times daily for 2 days.   escitalopram 20 MG tablet Commonly known as: LEXAPRO Take 20 mg by mouth in the morning.   ferrous sulfate 325 (65 FE) MG tablet Commonly known as: FerrouSul Take 1 tablet (325  mg total) by mouth 2 (two) times daily with a meal.   fluticasone 50 MCG/ACT nasal spray Commonly known as: FLONASE Place 1 spray into both nostrils in the morning.   furosemide 20 MG tablet Commonly known as: LASIX Take 1 tablet (20 mg total) by mouth daily. What changed: how much to take   lactulose 10 GM/15ML solution Commonly known as: CHRONULAC Take 15 mLs (10 g total) by mouth 3 (three) times daily. What changed:  how much to take when to take this   mesalamine 1000 MG suppository Commonly known as: CANASA Place 1 suppository rectally at bedtime.   multivitamin with minerals Tabs tablet Take 1 tablet by mouth daily.   NovoLOG FlexPen 100 UNIT/ML FlexPen Generic drug: insulin aspart Inject 3-12 Units into the skin in the morning and at bedtime. Can be up to 8 units twice a day depending on blood sugar. Take mid afternoon only if bloodsugar  is over 180  Takes in evening if it is 200 or over give up to 10 units per  daughter Sliding scale   pantoprazole 40 MG tablet Commonly known as: PROTONIX Take 1 tablet (40 mg total) by mouth daily.   rifaximin 550 MG Tabs tablet Commonly known as: XIFAXAN Take 550 mg by mouth 2 (two) times daily.   sitaGLIPtin-metformin 50-500 MG tablet Commonly known as: JANUMET Take 1 tablet by mouth 2 (two) times daily with a meal.   spironolactone 100 MG tablet Commonly known as: ALDACTONE Take 0.5 tablets (50 mg total) by mouth in the morning. What changed: how much to take   traZODone 50 MG tablet Commonly known as: DESYREL Take 50 mg by mouth at bedtime as needed for sleep.        Follow-up Information     Ronnette Juniper, MD. Schedule an appointment as soon as possible for a visit in 1 week(s).   Specialty: Gastroenterology Why: Hospital Follow Up Contact information: [place] [address] [phone]         Rosalee Kaufman, PA-C. Schedule an appointment as soon as possible for a visit in 1  week(s).   Specialty: Physician Assistant Why: Hospital Follow Up Contact information: [place] [address] [phone]                Allergies  Allergen Reactions   Asa [Aspirin] Other (See Comments)    Pt not allergic but cannot take due to bleeding    Other Other (See Comments)    Any jewelry metal-hives and itching    Allergies as of 04/11/2021       Reactions   Asa [aspirin] Other (See Comments)   Pt not allergic but cannot take due to bleeding    Other Other (See Comments)   Any jewelry metal-hives and itching         Medication List     STOP taking these medications    cephALEXin 250 MG capsule Commonly known as: KEFLEX   loratadine 10 MG tablet Commonly known as: CLARITIN   vitamin B-12 500 MCG tablet Commonly known as: CYANOCOBALAMIN       TAKE these medications    acetaminophen 325 MG tablet Commonly known as: TYLENOL Take 1-2 tablets (325-650 mg total) by mouth every 4 (four) hours as needed for mild pain.   ALPRAZolam 0.25 MG tablet Commonly known as: XANAX Take 1 tablet (0.25 mg total) by mouth daily as needed for anxiety. What changed:  when to take this reasons to take this   atenolol 25 MG tablet Commonly known as: TENORMIN Take 1 tablet (25 mg total) by mouth daily.   atorvastatin 80 MG tablet Commonly known as: LIPITOR Take 80 mg by mouth daily at 8 pm.   Basaglar KwikPen 100 UNIT/ML Inject 12 Units into the skin daily. What changed:  how much to take when to take this additional instructions   buPROPion 150 MG 24 hr tablet Commonly known as: WELLBUTRIN XL Take 150 mg by mouth in the morning.   cefdinir 300 MG capsule Commonly known as: OMNICEF Take 1 capsule (300 mg total) by mouth 2 (two) times daily for 2 days.   escitalopram 20 MG tablet Commonly known as: LEXAPRO Take 20 mg by mouth in the morning.   ferrous sulfate 325 (65 FE) MG tablet Commonly known as: FerrouSul Take 1 tablet (325 mg  total) by mouth 2 (two) times daily with a meal.   fluticasone 50 MCG/ACT nasal spray Commonly known as: FLONASE Place 1 spray into both nostrils  in the morning.   furosemide 20 MG tablet Commonly known as: LASIX Take 1 tablet (20 mg total) by mouth daily. What changed: how much to take   lactulose 10 GM/15ML solution Commonly known as: CHRONULAC Take 15 mLs (10 g total) by mouth 3 (three) times daily. What changed:  how much to take when to take this   mesalamine 1000 MG suppository Commonly known as: CANASA Place 1 suppository rectally at bedtime.   multivitamin with minerals Tabs tablet Take 1 tablet by mouth daily.   NovoLOG FlexPen 100 UNIT/ML FlexPen Generic drug: insulin aspart Inject 3-12 Units into the skin in the morning and at bedtime. Can be up to 8 units twice a day depending on blood sugar. Take mid afternoon only if bloodsugar  is over 180  Takes in evening if it is 200 or over give up to 10 units per daughter Sliding scale   pantoprazole 40 MG tablet Commonly known as: PROTONIX Take 1 tablet (40 mg total) by mouth daily.   rifaximin 550 MG Tabs tablet Commonly known as: XIFAXAN Take 550 mg by mouth 2 (two) times daily.   sitaGLIPtin-metformin 50-500 MG tablet Commonly known as: JANUMET Take 1 tablet by mouth 2 (two) times daily with a meal.   spironolactone 100 MG tablet Commonly known as: ALDACTONE Take 0.5 tablets (50 mg total) by mouth in the morning. What changed: how much to take   traZODone 50 MG tablet Commonly known as: DESYREL Take 50 mg by mouth at bedtime as needed for sleep.        Procedures/Studies: DG Chest 1 View  Result Date: 03/28/2021 CLINICAL DATA:  Cirrhosis, RIGHT pleural effusion post thoracentesis EXAM: CHEST  1 VIEW COMPARISON:  Expiratory [facility] exam compared to 03/27/2021 FINDINGS: Enlargement of cardiac silhouette. Significant decrease in RIGHT pleural effusion since previous exam with residual effusion atelectasis at  RIGHT base. No pneumothorax. Remaining lungs clear. Bones demineralized. IMPRESSION: No pneumothorax following RIGHT thoracentesis. Electronically Signed   By: Lavonia Dana M.D.   On: 03/28/2021 16:35   DG Chest Port 1 View  Result Date: 04/09/2021 CLINICAL DATA:  Shortness of breath EXAM: PORTABLE CHEST 1 VIEW COMPARISON:  Previous studies including the examination of 03/28/2021 FINDINGS: Transverse diameter of heart is increased. There are no signs of alveolar pulmonary edema. There is increased haziness in the right mid and right lower lung fields suggesting increase in amount of right pleural effusion. Left lateral CP angle is clear. There is no pneumothorax. IMPRESSION: Cardiomegaly. There is interval increase in amount of right pleural effusion. There is no pneumothorax. Electronically Signed   By: Elmer Picker M.D.   On: 04/09/2021 13:18   US THORACENTESIS ASP PLEURAL SPACE W/IMG GUIDE  Result Date: 03/28/2021 INDICATION: Shortness of breath, recurrent RIGHT pleural effusion, pleural effusion associated with cirrhosis EXAM: ULTRASOUND GUIDED THERAPEUTIC RIGHT THORACENTESIS MEDICATIONS: None. COMPLICATIONS: None immediate. PROCEDURE: An ultrasound guided thoracentesis was thoroughly discussed with the patient and questions answered. The benefits, risks, alternatives and complications were also discussed. The patient understands and wishes to proceed with the procedure. Written consent was obtained. Ultrasound was performed to localize and mark an adequate pocket of fluid in the RIGHT chest. The area was then prepped and draped in the normal sterile fashion. 1% Lidocaine was used for local anesthesia. Under ultrasound guidance a 8 French thoracentesis catheter was introduced. Thoracentesis was performed. The catheter was removed and a dressing applied. FINDINGS: A total of approximately 1.55 L of clear yellow RIGHT pleural  fluid was removed. IMPRESSION: Successful ultrasound guided RIGHT  thoracentesis yielding 1.55 L of pleural fluid. Electronically Signed   By: Lavonia Dana M.D.   On: 03/28/2021 16:33     Subjective: Pt eating and drinking much better and seems back to her baseline per husband  Discharge Exam: Vitals:   04/11/21 0426 04/11/21 1403  BP: 139/67 130/64  Pulse: 71 82  Resp: 19 16  Temp: 98 F (36.7 C) 98.2 F (36.8 C)  SpO2: 98% 99%   Vitals:   04/10/21 1500 04/10/21 2102 04/11/21 0426 04/11/21 1403  BP: (!) 105/52 (!) 116/51 139/67 130/64  Pulse: 67 70 71 82  Resp: 20 19 19 16   Temp: 98.4 F (36.9 C) 98.7 F (37.1 C) 98 F (36.7 C) 98.2 F (36.8 C)  TempSrc: Oral Oral Oral Oral  SpO2: 99% 97% 98% 99%  Weight:      Height:       General exam: frail, elderly female, Appears calm and comfortable  Respiratory system: Clear to auscultation. Respiratory effort normal. Cardiovascular system: normal S1 & S2 heard. No JVD, murmurs, rubs, gallops or clicks. trace pedal edema. Gastrointestinal system: Abdomen is mildly distended, soft and nontender. No organomegaly or masses felt. Normal bowel sounds heard. Central nervous system: Alert and oriented. No focal neurological deficits. Extremities: Symmetric 5 x 5 power. Skin: No rashes, lesions or ulcers Psychiatry: Judgement and insight appear normal. Mood & affect appropriate.      The results of significant diagnostics from this hospitalization (including imaging, microbiology, ancillary and laboratory) are listed below for reference.     Microbiology: No results found for this or any previous visit (from the past 240 hour(s)).   Labs: BNP (last 3 results) Recent Labs    03/05/21 1405  BNP 67.1   Basic Metabolic Panel: Recent Labs  Lab 04/09/21 1116 04/10/21 0548 04/11/21 0459  NA 133* 135 136  K 4.1 3.5 3.1*  CL 95* 102 106  CO2 25 22 20*  GLUCOSE 156* 106* 164*  BUN 34* 29* 22  CREATININE 1.67* 1.44* 1.24*  CALCIUM 8.5* 8.1* 8.1*  MG  --   --  1.9   Liver Function  Tests: Recent Labs  Lab 04/09/21 1116 04/10/21 0548 04/11/21 0459  AST 52* 49* 54*  ALT 38 35 37  ALKPHOS 94 81 77  BILITOT 2.0* 1.6* 1.2  PROT 5.8* 5.4* 5.2*  ALBUMIN 2.4* 2.3* 2.1*   No results for input(s): LIPASE, AMYLASE in the last 168 hours. Recent Labs  Lab 04/09/21 1121 04/10/21 0548 04/11/21 0459  AMMONIA 72* 108* 41*   CBC: Recent Labs  Lab 04/09/21 1116 04/10/21 0548 04/11/21 0459  WBC 4.4 3.7* 3.7*  NEUTROABS 2.8  --   --   HGB 11.6* 11.1* 10.9*  HCT 35.3* 35.2* 33.9*  MCV 96.2 100.0 101.2*  PLT 69* 51* 46*   Cardiac Enzymes: No results for input(s): CKTOTAL, CKMB, CKMBINDEX, TROPONINI in the last 168 hours. BNP: Invalid input(s): POCBNP CBG: Recent Labs  Lab 04/10/21 1639 04/10/21 2105 04/11/21 0256 04/11/21 0758 04/11/21 1143  GLUCAP 192* 218* 172* 166* 174*   D-Dimer No results for input(s): DDIMER in the last 72 hours. Hgb A1c Recent Labs    04/09/21 1116  HGBA1C 5.7*   Lipid Profile No results for input(s): CHOL, HDL, LDLCALC, TRIG, CHOLHDL, LDLDIRECT in the last 72 hours. Thyroid function studies No results for input(s): TSH, T4TOTAL, T3FREE, THYROIDAB in the last 72 hours.  Invalid input(s): FREET3 Anemia  work up No results for input(s): VITAMINB12, FOLATE, FERRITIN, TIBC, IRON, RETICCTPCT in the last 72 hours. Urinalysis    Component Value Date/Time   COLORURINE YELLOW 04/09/2021 0034   APPEARANCEUR HAZY (A) 04/09/2021 0034   LABSPEC 1.025 04/09/2021 0034   PHURINE 6.0 04/09/2021 0034   GLUCOSEU NEGATIVE 04/09/2021 0034   HGBUR LARGE (A) 04/09/2021 0034   BILIRUBINUR NEGATIVE 04/09/2021 0034   BILIRUBINUR NEGATIVE 03/06/2013 1100   KETONESUR NEGATIVE 04/09/2021 0034   PROTEINUR 30 (A) 04/09/2021 0034   UROBILINOGEN negative 03/06/2013 1100   NITRITE POSITIVE (A) 04/09/2021 0034   LEUKOCYTESUR LARGE (A) 04/09/2021 0034   Sepsis Labs Invalid input(s): PROCALCITONIN,  WBC,  LACTICIDVEN Microbiology No results found  for this or any previous visit (from the past 240 hour(s)).  Time coordinating discharge:  40 mins  SIGNED:  Irwin Brakeman, MD  [facility] 04/11/2021, 3:46 PM How to contact the [facility] Attending or Consulting provider [place] or covering provider during after hours [place], for this patient?  Check the care team in [facility] and look for a) attending/consulting [facility] provider listed and b) the [facility] team listed Log into [email or web address] and use [place]'s universal password to access. If you do not have the password, please contact the hospital operator. Locate the [facility] provider you are looking for under [facility] and page to a number that you can be directly reached. If you still have difficulty reaching the provider, please page the [facility] (Director on Call) for the Hospitalists listed on amion for assistance.

## 2021-04-11 NOTE — Progress Notes (Signed)
Subjective: Patient is alert. Son and daughter at bedside. She is in no distress. Denies abdominal pain. No overt GI bleeding. Difficult telling me the year or city. Hard to find the "right words". She does laugh at an inside joke between her and her daughter.   Objective: Vital signs in last 24 hours: Temp:  [98 F (36.7 C)-98.7 F (37.1 C)] 98 F (36.7 C) (01/20 0426) Pulse Rate:  [67-71] 71 (01/20 0426) Resp:  [19-20] 19 (01/20 0426) BP: (105-139)/(51-67) 139/67 (01/20 0426) SpO2:  [97 %-99 %] 98 % (01/20 0426) Last BM Date: 04/10/21 General:   Alert and oriented to person, unable to tell date or city, chronically ill-appearing Head:  Normocephalic and atraumatic. Eyes:  No icterus, sclera clear. Conjuctiva pink.  Abdomen:  Bowel sounds present, soft, non-tender, non-distended.  Extremities:  Without edema LLE. Right AKA  Neurologic:  Oriented to person and place. Negative asterixis    Intake/Output from previous day: 01/19 0701 - 01/20 0700 In: 1746.1 [P.O.:500; I.V.:1146.1; IV Piggyback:100] Out: 150 [Urine:150] Intake/Output this shift: Total I/O In: 120 [P.O.:120] Out: -   Lab Results: Recent Labs    04/09/21 1116 04/10/21 0548 04/11/21 0459  WBC 4.4 3.7* 3.7*  HGB 11.6* 11.1* 10.9*  HCT 35.3* 35.2* 33.9*  PLT 69* 51* 46*   BMET Recent Labs    04/09/21 1116 04/10/21 0548 04/11/21 0459  NA 133* 135 136  K 4.1 3.5 3.1*  CL 95* 102 106  CO2 25 22 20*  GLUCOSE 156* 106* 164*  BUN 34* 29* 22  CREATININE 1.67* 1.44* 1.24*  CALCIUM 8.5* 8.1* 8.1*   LFT Recent Labs    04/09/21 1116 04/10/21 0548 04/11/21 0459  PROT 5.8* 5.4* 5.2*  ALBUMIN 2.4* 2.3* 2.1*  AST 52* 49* 54*  ALT 38 35 37  ALKPHOS 94 81 77  BILITOT 2.0* 1.6* 1.2   PT/INR Recent Labs    04/10/21 0548 04/11/21 0459  LABPROT 16.7* 17.4*  INR 1.4* 1.4*     Studies/Results: DG Chest Port 1 View  Result Date: 04/09/2021 CLINICAL DATA:  Shortness of breath EXAM: PORTABLE  CHEST 1 VIEW COMPARISON:  Previous studies including the examination of 03/28/2021 FINDINGS: Transverse diameter of heart is increased. There are no signs of alveolar pulmonary edema. There is increased haziness in the right mid and right lower lung fields suggesting increase in amount of right pleural effusion. Left lateral CP angle is clear. There is no pneumothorax. IMPRESSION: Cardiomegaly. There is interval increase in amount of right pleural effusion. There is no pneumothorax. Electronically Signed   By: Elmer Picker M.D.   On: 04/09/2021 13:18    Assessment: 76 year old female with history of decompensated [name] cirrhosis complicated by recurrent hepatic encephalopathy, recurrent right pleural effusion requiring thoracentesis, thrombocytopenia, diabetes, hypertension presenting with altered mental status due to acute hepatic encephalopathy.    Hepatic encephalopathy: Remains an ongoing issue with recurrent episodes at home. Current presentation multifactorial in setting of UTI and poor compliance of lactulose. She is alert to person today but difficulty finding words to respond to orientation. She is able to laugh with her daughter regarding an "inside joke" at bedside. Findings discussed with Dr. Wynetta Emery. 4 stools documented over past 24 hours. Russo lactulose titrated for at least 3 soft stools daily. Continue with Xifaxan BID.    NASH cirrhosis: With decompensation. MELD Na 16 today.  Complicated by recurrent right pleural effusion requiring several thoracenteses. Diuretics are on hold currently. Palliative consultation would be  ideal in this setting in light of end-stage liver disease; however, palliative not available today. Recommend close follow-up with her primary GI to arrange outpatient palliative consultation if available.     Anemia: History of B12 and iron deficiency followed by hematology.  No overt GI bleeding.  History of radiation proctitis on flexible sigmoidoscopy in 2020  status post ablation.  EGD in 2020 with no evidence of varices.  CT in December with evidence of gastric and esophageal varices.     Plan: Continue current lactulose dosing; monitor for diarrhea.  Continue Xifaxan BID Outpatient follow-up with [facility] Recommend palliative involvement but unable to have consultation today. Russo close follow-up with her primary GI as outpatient   Brittany Needs, PhD, ANP-BC [facility]    LOS: 2 days    04/11/2021, 10:43 AM

## 2021-04-11 NOTE — Discharge Instructions (Signed)
IMPORTANT INFORMATION: PAY CLOSE ATTENTION   PHYSICIAN DISCHARGE INSTRUCTIONS  Follow with Primary care provider  Rosine Door  and other consultants as instructed by your Hospitalist Physician  [place] IF SYMPTOMS COME BACK, WORSEN OR NEW PROBLEM DEVELOPS   Please note: You were cared for by a hospitalist during your hospital stay. Every effort will be made to forward records to your primary care provider.  You can request that your primary care provider send for your hospital records if they have not received them.  Once you are discharged, your primary care physician will handle any further medical issues. Please note that NO REFILLS for any discharge medications will be authorized once you are discharged, as it is imperative that you return to your primary care physician (or establish a relationship with a primary care physician if you do not have one) for your post hospital discharge needs so that they can reassess your need for medications and monitor your lab values.  Please get a complete blood count and chemistry panel checked by your Primary MD at your next visit, and again as instructed by your Primary MD.  Get Medicines reviewed and adjusted: Please take all your medications with you for your next visit with your Primary MD  Laboratory/radiological data: Please request your Primary MD to go over all hospital tests and procedure/radiological results at the follow up, please ask your primary care provider to get all Hospital records sent to his/her office.  In some cases, they will be blood work, cultures and biopsy results pending at the time of your discharge. Please request that your primary care provider follow up on these results.  If you are diabetic, please bring your blood sugar readings with you to your follow up appointment with primary care.    Please call and make your follow up appointments as soon as possible.     Also Note the following: If you experience worsening of your admission symptoms, develop shortness of breath, life threatening emergency, suicidal or homicidal thoughts you must seek medical attention immediately by calling 911 or calling your MD immediately  if symptoms less severe.  You must read complete instructions/literature along with all the possible adverse reactions/side effects for all the Medicines you take and that have been prescribed to you. Take any new Medicines after you have completely understood and accpet all the possible adverse reactions/side effects.   Do not drive when taking Pain medications or sleeping medications (Benzodiazepines)  Do not take more than prescribed Pain, Sleep and Anxiety Medications. It is not advisable to combine anxiety,sleep and pain medications without talking with your primary care practitioner  Special Instructions: If you have smoked or chewed Tobacco  in the last 2 yrs please stop smoking, stop any regular Alcohol  and or any Recreational drug use.  Wear Seat belts while driving.  Do not drive if taking any narcotic, mind altering or controlled substances or recreational drugs or alcohol.

## 2021-04-11 NOTE — Plan of Care (Signed)
  Problem: Education: Goal: Knowledge of General Education information will improve Description: Including pain rating scale, medication(s)/side effects and non-pharmacologic comfort measures Outcome: Progressing   Problem: Pain Managment: Goal: General experience of comfort will improve Outcome: Progressing   Problem: Safety: Goal: Ability to remain free from injury will improve Outcome: Progressing   

## 2021-04-11 NOTE — Care Management Important Message (Signed)
Important Message  Patient Details  Name: Brittany Russo MRN: [id number] Date of Birth: [birth year]   Medicare Important Message Given:  Yes     Tommy Medal 04/11/2021, 2:36 PM

## 2021-04-14 DIAGNOSIS — J45909 Unspecified asthma, uncomplicated: Secondary | ICD-10-CM | POA: Diagnosis not present

## 2021-04-14 DIAGNOSIS — E1165 Type 2 diabetes mellitus with hyperglycemia: Secondary | ICD-10-CM | POA: Diagnosis not present

## 2021-04-14 DIAGNOSIS — R1310 Dysphagia, unspecified: Secondary | ICD-10-CM | POA: Diagnosis not present

## 2021-04-14 DIAGNOSIS — F419 Anxiety disorder, unspecified: Secondary | ICD-10-CM | POA: Diagnosis not present

## 2021-04-14 DIAGNOSIS — R569 Unspecified convulsions: Secondary | ICD-10-CM | POA: Diagnosis not present

## 2021-04-14 DIAGNOSIS — D519 Vitamin B12 deficiency anemia, unspecified: Secondary | ICD-10-CM | POA: Diagnosis not present

## 2021-04-14 DIAGNOSIS — D696 Thrombocytopenia, unspecified: Secondary | ICD-10-CM | POA: Diagnosis not present

## 2021-04-14 DIAGNOSIS — Z794 Long term (current) use of insulin: Secondary | ICD-10-CM | POA: Diagnosis not present

## 2021-04-14 DIAGNOSIS — N179 Acute kidney failure, unspecified: Secondary | ICD-10-CM | POA: Diagnosis not present

## 2021-04-14 DIAGNOSIS — K7682 Hepatic encephalopathy: Secondary | ICD-10-CM | POA: Diagnosis not present

## 2021-04-14 DIAGNOSIS — E785 Hyperlipidemia, unspecified: Secondary | ICD-10-CM | POA: Diagnosis not present

## 2021-04-14 DIAGNOSIS — E041 Nontoxic single thyroid nodule: Secondary | ICD-10-CM | POA: Diagnosis not present

## 2021-04-14 DIAGNOSIS — Z89611 Acquired absence of right leg above knee: Secondary | ICD-10-CM | POA: Diagnosis not present

## 2021-04-14 DIAGNOSIS — N390 Urinary tract infection, site not specified: Secondary | ICD-10-CM | POA: Diagnosis not present

## 2021-04-14 DIAGNOSIS — D509 Iron deficiency anemia, unspecified: Secondary | ICD-10-CM | POA: Diagnosis not present

## 2021-04-14 DIAGNOSIS — I10 Essential (primary) hypertension: Secondary | ICD-10-CM | POA: Diagnosis not present

## 2021-04-14 DIAGNOSIS — Z8544 Personal history of malignant neoplasm of other female genital organs: Secondary | ICD-10-CM | POA: Diagnosis not present

## 2021-04-14 DIAGNOSIS — K7581 Nonalcoholic steatohepatitis (NASH): Secondary | ICD-10-CM | POA: Diagnosis not present

## 2021-04-14 DIAGNOSIS — R188 Other ascites: Secondary | ICD-10-CM | POA: Diagnosis not present

## 2021-04-14 DIAGNOSIS — Z7984 Long term (current) use of oral hypoglycemic drugs: Secondary | ICD-10-CM | POA: Diagnosis not present

## 2021-04-14 DIAGNOSIS — J90 Pleural effusion, not elsewhere classified: Secondary | ICD-10-CM | POA: Diagnosis not present

## 2021-04-14 DIAGNOSIS — G546 Phantom limb syndrome with pain: Secondary | ICD-10-CM | POA: Diagnosis not present

## 2021-04-14 DIAGNOSIS — Z7982 Long term (current) use of aspirin: Secondary | ICD-10-CM | POA: Diagnosis not present

## 2021-04-14 DIAGNOSIS — K7469 Other cirrhosis of liver: Secondary | ICD-10-CM | POA: Diagnosis not present

## 2021-04-14 DIAGNOSIS — F32A Depression, unspecified: Secondary | ICD-10-CM | POA: Diagnosis not present

## 2021-04-17 DIAGNOSIS — K7682 Hepatic encephalopathy: Secondary | ICD-10-CM | POA: Diagnosis not present

## 2021-04-17 DIAGNOSIS — N179 Acute kidney failure, unspecified: Secondary | ICD-10-CM | POA: Diagnosis not present

## 2021-04-17 DIAGNOSIS — K7581 Nonalcoholic steatohepatitis (NASH): Secondary | ICD-10-CM | POA: Diagnosis not present

## 2021-04-17 DIAGNOSIS — E1165 Type 2 diabetes mellitus with hyperglycemia: Secondary | ICD-10-CM | POA: Diagnosis not present

## 2021-04-17 DIAGNOSIS — R188 Other ascites: Secondary | ICD-10-CM | POA: Diagnosis not present

## 2021-04-17 DIAGNOSIS — K7469 Other cirrhosis of liver: Secondary | ICD-10-CM | POA: Diagnosis not present

## 2021-04-21 DIAGNOSIS — K7682 Hepatic encephalopathy: Secondary | ICD-10-CM | POA: Diagnosis not present

## 2021-04-21 DIAGNOSIS — K7469 Other cirrhosis of liver: Secondary | ICD-10-CM | POA: Diagnosis not present

## 2021-04-21 DIAGNOSIS — K769 Liver disease, unspecified: Secondary | ICD-10-CM | POA: Diagnosis not present

## 2021-04-21 DIAGNOSIS — S78119A Complete traumatic amputation at level between unspecified hip and knee, initial encounter: Secondary | ICD-10-CM | POA: Diagnosis not present

## 2021-04-21 DIAGNOSIS — I700 Atherosclerosis of aorta: Secondary | ICD-10-CM | POA: Diagnosis not present

## 2021-04-21 DIAGNOSIS — D696 Thrombocytopenia, unspecified: Secondary | ICD-10-CM | POA: Diagnosis not present

## 2021-04-21 DIAGNOSIS — K7581 Nonalcoholic steatohepatitis (NASH): Secondary | ICD-10-CM | POA: Diagnosis not present

## 2021-04-21 DIAGNOSIS — R0602 Shortness of breath: Secondary | ICD-10-CM | POA: Diagnosis not present

## 2021-04-21 DIAGNOSIS — K7290 Hepatic failure, unspecified without coma: Secondary | ICD-10-CM | POA: Diagnosis not present

## 2021-04-21 DIAGNOSIS — E1165 Type 2 diabetes mellitus with hyperglycemia: Secondary | ICD-10-CM | POA: Diagnosis not present

## 2021-04-21 DIAGNOSIS — R188 Other ascites: Secondary | ICD-10-CM | POA: Diagnosis not present

## 2021-04-21 DIAGNOSIS — N179 Acute kidney failure, unspecified: Secondary | ICD-10-CM | POA: Diagnosis not present

## 2021-04-21 DIAGNOSIS — F419 Anxiety disorder, unspecified: Secondary | ICD-10-CM | POA: Diagnosis not present

## 2021-04-21 DIAGNOSIS — I10 Essential (primary) hypertension: Secondary | ICD-10-CM | POA: Diagnosis not present

## 2021-04-24 DIAGNOSIS — K7469 Other cirrhosis of liver: Secondary | ICD-10-CM | POA: Diagnosis not present

## 2021-04-24 DIAGNOSIS — N179 Acute kidney failure, unspecified: Secondary | ICD-10-CM | POA: Diagnosis not present

## 2021-04-24 DIAGNOSIS — K7682 Hepatic encephalopathy: Secondary | ICD-10-CM | POA: Diagnosis not present

## 2021-04-24 DIAGNOSIS — E1165 Type 2 diabetes mellitus with hyperglycemia: Secondary | ICD-10-CM | POA: Diagnosis not present

## 2021-04-24 DIAGNOSIS — K7581 Nonalcoholic steatohepatitis (NASH): Secondary | ICD-10-CM | POA: Diagnosis not present

## 2021-04-24 DIAGNOSIS — R188 Other ascites: Secondary | ICD-10-CM | POA: Diagnosis not present

## 2021-04-28 DIAGNOSIS — R188 Other ascites: Secondary | ICD-10-CM | POA: Diagnosis not present

## 2021-04-28 DIAGNOSIS — K7469 Other cirrhosis of liver: Secondary | ICD-10-CM | POA: Diagnosis not present

## 2021-04-28 DIAGNOSIS — N179 Acute kidney failure, unspecified: Secondary | ICD-10-CM | POA: Diagnosis not present

## 2021-04-28 DIAGNOSIS — K7682 Hepatic encephalopathy: Secondary | ICD-10-CM | POA: Diagnosis not present

## 2021-04-28 DIAGNOSIS — K7581 Nonalcoholic steatohepatitis (NASH): Secondary | ICD-10-CM | POA: Diagnosis not present

## 2021-04-28 DIAGNOSIS — E1165 Type 2 diabetes mellitus with hyperglycemia: Secondary | ICD-10-CM | POA: Diagnosis not present

## 2021-04-29 ENCOUNTER — Ambulatory Visit (HOSPITAL_COMMUNITY): Payer: Medicare Other

## 2021-05-01 DIAGNOSIS — E1165 Type 2 diabetes mellitus with hyperglycemia: Secondary | ICD-10-CM | POA: Diagnosis not present

## 2021-05-01 DIAGNOSIS — K7581 Nonalcoholic steatohepatitis (NASH): Secondary | ICD-10-CM | POA: Diagnosis not present

## 2021-05-01 DIAGNOSIS — N179 Acute kidney failure, unspecified: Secondary | ICD-10-CM | POA: Diagnosis not present

## 2021-05-01 DIAGNOSIS — R188 Other ascites: Secondary | ICD-10-CM | POA: Diagnosis not present

## 2021-05-01 DIAGNOSIS — K7682 Hepatic encephalopathy: Secondary | ICD-10-CM | POA: Diagnosis not present

## 2021-05-01 DIAGNOSIS — K7469 Other cirrhosis of liver: Secondary | ICD-10-CM | POA: Diagnosis not present

## 2021-05-06 ENCOUNTER — Ambulatory Visit (HOSPITAL_COMMUNITY)
Admission: RE | Admit: 2021-05-06 | Discharge: 2021-05-06 | Disposition: A | Discharge status: Home / Self Care | Payer: Medicare Other | Source: Ambulatory Visit | Attending: Gastroenterology | Admitting: Gastroenterology

## 2021-05-06 ENCOUNTER — Other Ambulatory Visit: Payer: Self-pay

## 2021-05-06 DIAGNOSIS — R918 Other nonspecific abnormal finding of lung field: Secondary | ICD-10-CM | POA: Diagnosis not present

## 2021-05-06 DIAGNOSIS — K7030 Alcoholic cirrhosis of liver without ascites: Secondary | ICD-10-CM | POA: Insufficient documentation

## 2021-05-06 DIAGNOSIS — K7460 Unspecified cirrhosis of liver: Secondary | ICD-10-CM | POA: Diagnosis not present

## 2021-05-06 DIAGNOSIS — K824 Cholesterolosis of gallbladder: Secondary | ICD-10-CM | POA: Diagnosis not present

## 2021-05-06 DIAGNOSIS — J90 Pleural effusion, not elsewhere classified: Secondary | ICD-10-CM | POA: Diagnosis not present

## 2021-05-06 DIAGNOSIS — R188 Other ascites: Secondary | ICD-10-CM | POA: Diagnosis not present

## 2021-05-06 DIAGNOSIS — Z8719 Personal history of other diseases of the digestive system: Secondary | ICD-10-CM | POA: Diagnosis not present

## 2021-05-07 ENCOUNTER — Other Ambulatory Visit (HOSPITAL_COMMUNITY): Payer: Self-pay | Admitting: Gastroenterology

## 2021-05-07 DIAGNOSIS — R188 Other ascites: Secondary | ICD-10-CM | POA: Diagnosis not present

## 2021-05-07 DIAGNOSIS — N179 Acute kidney failure, unspecified: Secondary | ICD-10-CM | POA: Diagnosis not present

## 2021-05-07 DIAGNOSIS — K7581 Nonalcoholic steatohepatitis (NASH): Secondary | ICD-10-CM | POA: Diagnosis not present

## 2021-05-07 DIAGNOSIS — K7469 Other cirrhosis of liver: Secondary | ICD-10-CM | POA: Diagnosis not present

## 2021-05-07 DIAGNOSIS — E1165 Type 2 diabetes mellitus with hyperglycemia: Secondary | ICD-10-CM | POA: Diagnosis not present

## 2021-05-07 DIAGNOSIS — K7460 Unspecified cirrhosis of liver: Secondary | ICD-10-CM

## 2021-05-07 DIAGNOSIS — K7682 Hepatic encephalopathy: Secondary | ICD-10-CM | POA: Diagnosis not present

## 2021-05-09 DIAGNOSIS — N390 Urinary tract infection, site not specified: Secondary | ICD-10-CM | POA: Diagnosis not present

## 2021-05-11 DIAGNOSIS — E1165 Type 2 diabetes mellitus with hyperglycemia: Secondary | ICD-10-CM | POA: Diagnosis not present

## 2021-05-11 DIAGNOSIS — K769 Liver disease, unspecified: Secondary | ICD-10-CM | POA: Diagnosis not present

## 2021-05-11 DIAGNOSIS — K7581 Nonalcoholic steatohepatitis (NASH): Secondary | ICD-10-CM | POA: Diagnosis not present

## 2021-05-11 DIAGNOSIS — R0602 Shortness of breath: Secondary | ICD-10-CM | POA: Diagnosis not present

## 2021-05-11 DIAGNOSIS — S78119A Complete traumatic amputation at level between unspecified hip and knee, initial encounter: Secondary | ICD-10-CM | POA: Diagnosis not present

## 2021-05-11 DIAGNOSIS — I10 Essential (primary) hypertension: Secondary | ICD-10-CM | POA: Diagnosis not present

## 2021-05-11 DIAGNOSIS — I700 Atherosclerosis of aorta: Secondary | ICD-10-CM | POA: Diagnosis not present

## 2021-05-11 DIAGNOSIS — K7290 Hepatic failure, unspecified without coma: Secondary | ICD-10-CM | POA: Diagnosis not present

## 2021-05-14 DIAGNOSIS — K7581 Nonalcoholic steatohepatitis (NASH): Secondary | ICD-10-CM | POA: Diagnosis not present

## 2021-05-14 DIAGNOSIS — R188 Other ascites: Secondary | ICD-10-CM | POA: Diagnosis not present

## 2021-05-14 DIAGNOSIS — Z89611 Acquired absence of right leg above knee: Secondary | ICD-10-CM | POA: Diagnosis not present

## 2021-05-14 DIAGNOSIS — E785 Hyperlipidemia, unspecified: Secondary | ICD-10-CM | POA: Diagnosis not present

## 2021-05-14 DIAGNOSIS — R1310 Dysphagia, unspecified: Secondary | ICD-10-CM | POA: Diagnosis not present

## 2021-05-14 DIAGNOSIS — Z8544 Personal history of malignant neoplasm of other female genital organs: Secondary | ICD-10-CM | POA: Diagnosis not present

## 2021-05-14 DIAGNOSIS — N179 Acute kidney failure, unspecified: Secondary | ICD-10-CM | POA: Diagnosis not present

## 2021-05-14 DIAGNOSIS — F32A Depression, unspecified: Secondary | ICD-10-CM | POA: Diagnosis not present

## 2021-05-14 DIAGNOSIS — Z794 Long term (current) use of insulin: Secondary | ICD-10-CM | POA: Diagnosis not present

## 2021-05-14 DIAGNOSIS — K7682 Hepatic encephalopathy: Secondary | ICD-10-CM | POA: Diagnosis not present

## 2021-05-14 DIAGNOSIS — J90 Pleural effusion, not elsewhere classified: Secondary | ICD-10-CM | POA: Diagnosis not present

## 2021-05-14 DIAGNOSIS — Z7984 Long term (current) use of oral hypoglycemic drugs: Secondary | ICD-10-CM | POA: Diagnosis not present

## 2021-05-14 DIAGNOSIS — D696 Thrombocytopenia, unspecified: Secondary | ICD-10-CM | POA: Diagnosis not present

## 2021-05-14 DIAGNOSIS — I10 Essential (primary) hypertension: Secondary | ICD-10-CM | POA: Diagnosis not present

## 2021-05-14 DIAGNOSIS — D519 Vitamin B12 deficiency anemia, unspecified: Secondary | ICD-10-CM | POA: Diagnosis not present

## 2021-05-14 DIAGNOSIS — G546 Phantom limb syndrome with pain: Secondary | ICD-10-CM | POA: Diagnosis not present

## 2021-05-14 DIAGNOSIS — R569 Unspecified convulsions: Secondary | ICD-10-CM | POA: Diagnosis not present

## 2021-05-14 DIAGNOSIS — K7469 Other cirrhosis of liver: Secondary | ICD-10-CM | POA: Diagnosis not present

## 2021-05-14 DIAGNOSIS — E041 Nontoxic single thyroid nodule: Secondary | ICD-10-CM | POA: Diagnosis not present

## 2021-05-14 DIAGNOSIS — D509 Iron deficiency anemia, unspecified: Secondary | ICD-10-CM | POA: Diagnosis not present

## 2021-05-14 DIAGNOSIS — E1165 Type 2 diabetes mellitus with hyperglycemia: Secondary | ICD-10-CM | POA: Diagnosis not present

## 2021-05-14 DIAGNOSIS — F419 Anxiety disorder, unspecified: Secondary | ICD-10-CM | POA: Diagnosis not present

## 2021-05-14 DIAGNOSIS — Z7982 Long term (current) use of aspirin: Secondary | ICD-10-CM | POA: Diagnosis not present

## 2021-05-14 DIAGNOSIS — N390 Urinary tract infection, site not specified: Secondary | ICD-10-CM | POA: Diagnosis not present

## 2021-05-14 DIAGNOSIS — J45909 Unspecified asthma, uncomplicated: Secondary | ICD-10-CM | POA: Diagnosis not present

## 2021-05-20 DIAGNOSIS — R300 Dysuria: Secondary | ICD-10-CM | POA: Diagnosis not present

## 2021-05-21 DIAGNOSIS — E1165 Type 2 diabetes mellitus with hyperglycemia: Secondary | ICD-10-CM | POA: Diagnosis not present

## 2021-05-21 DIAGNOSIS — K7682 Hepatic encephalopathy: Secondary | ICD-10-CM | POA: Diagnosis not present

## 2021-05-21 DIAGNOSIS — K7469 Other cirrhosis of liver: Secondary | ICD-10-CM | POA: Diagnosis not present

## 2021-05-21 DIAGNOSIS — N179 Acute kidney failure, unspecified: Secondary | ICD-10-CM | POA: Diagnosis not present

## 2021-05-21 DIAGNOSIS — R188 Other ascites: Secondary | ICD-10-CM | POA: Diagnosis not present

## 2021-05-21 DIAGNOSIS — K7581 Nonalcoholic steatohepatitis (NASH): Secondary | ICD-10-CM | POA: Diagnosis not present

## 2021-05-26 ENCOUNTER — Encounter (HOSPITAL_COMMUNITY): Payer: Self-pay

## 2021-05-26 ENCOUNTER — Emergency Department (HOSPITAL_COMMUNITY): Payer: Medicare Other

## 2021-05-26 ENCOUNTER — Inpatient Hospital Stay (HOSPITAL_COMMUNITY)
Admission: EM | Admit: 2021-05-26 | Discharge: 2021-05-29 | DRG: 101 | Disposition: A | Discharge status: Home Health | Payer: Medicare Other | Attending: Family Medicine | Admitting: Family Medicine

## 2021-05-26 ENCOUNTER — Other Ambulatory Visit: Payer: Self-pay

## 2021-05-26 DIAGNOSIS — E7220 Disorder of urea cycle metabolism, unspecified: Secondary | ICD-10-CM | POA: Diagnosis not present

## 2021-05-26 DIAGNOSIS — Z20822 Contact with and (suspected) exposure to covid-19: Secondary | ICD-10-CM | POA: Diagnosis present

## 2021-05-26 DIAGNOSIS — D61818 Other pancytopenia: Secondary | ICD-10-CM | POA: Diagnosis present

## 2021-05-26 DIAGNOSIS — N1832 Chronic kidney disease, stage 3b: Secondary | ICD-10-CM | POA: Diagnosis present

## 2021-05-26 DIAGNOSIS — N390 Urinary tract infection, site not specified: Secondary | ICD-10-CM | POA: Diagnosis present

## 2021-05-26 DIAGNOSIS — E785 Hyperlipidemia, unspecified: Secondary | ICD-10-CM | POA: Diagnosis present

## 2021-05-26 DIAGNOSIS — Z8542 Personal history of malignant neoplasm of other parts of uterus: Secondary | ICD-10-CM

## 2021-05-26 DIAGNOSIS — Z79899 Other long term (current) drug therapy: Secondary | ICD-10-CM

## 2021-05-26 DIAGNOSIS — I129 Hypertensive chronic kidney disease with stage 1 through stage 4 chronic kidney disease, or unspecified chronic kidney disease: Secondary | ICD-10-CM | POA: Diagnosis present

## 2021-05-26 DIAGNOSIS — K219 Gastro-esophageal reflux disease without esophagitis: Secondary | ICD-10-CM

## 2021-05-26 DIAGNOSIS — R188 Other ascites: Secondary | ICD-10-CM | POA: Diagnosis present

## 2021-05-26 DIAGNOSIS — B9689 Other specified bacterial agents as the cause of diseases classified elsewhere: Secondary | ICD-10-CM | POA: Diagnosis present

## 2021-05-26 DIAGNOSIS — F419 Anxiety disorder, unspecified: Secondary | ICD-10-CM | POA: Diagnosis present

## 2021-05-26 DIAGNOSIS — Z89611 Acquired absence of right leg above knee: Secondary | ICD-10-CM

## 2021-05-26 DIAGNOSIS — I10 Essential (primary) hypertension: Secondary | ICD-10-CM | POA: Diagnosis not present

## 2021-05-26 DIAGNOSIS — R442 Other hallucinations: Secondary | ICD-10-CM | POA: Diagnosis not present

## 2021-05-26 DIAGNOSIS — E1165 Type 2 diabetes mellitus with hyperglycemia: Secondary | ICD-10-CM | POA: Diagnosis not present

## 2021-05-26 DIAGNOSIS — Z8249 Family history of ischemic heart disease and other diseases of the circulatory system: Secondary | ICD-10-CM | POA: Diagnosis not present

## 2021-05-26 DIAGNOSIS — B952 Enterococcus as the cause of diseases classified elsewhere: Secondary | ICD-10-CM | POA: Diagnosis present

## 2021-05-26 DIAGNOSIS — Z833 Family history of diabetes mellitus: Secondary | ICD-10-CM | POA: Diagnosis not present

## 2021-05-26 DIAGNOSIS — R569 Unspecified convulsions: Secondary | ICD-10-CM | POA: Diagnosis not present

## 2021-05-26 DIAGNOSIS — E860 Dehydration: Secondary | ICD-10-CM | POA: Diagnosis present

## 2021-05-26 DIAGNOSIS — E871 Hypo-osmolality and hyponatremia: Secondary | ICD-10-CM | POA: Diagnosis not present

## 2021-05-26 DIAGNOSIS — D696 Thrombocytopenia, unspecified: Secondary | ICD-10-CM | POA: Diagnosis present

## 2021-05-26 DIAGNOSIS — N179 Acute kidney failure, unspecified: Secondary | ICD-10-CM | POA: Diagnosis present

## 2021-05-26 DIAGNOSIS — R410 Disorientation, unspecified: Secondary | ICD-10-CM

## 2021-05-26 DIAGNOSIS — K7460 Unspecified cirrhosis of liver: Secondary | ICD-10-CM | POA: Diagnosis present

## 2021-05-26 DIAGNOSIS — S78119A Complete traumatic amputation at level between unspecified hip and knee, initial encounter: Secondary | ICD-10-CM | POA: Diagnosis present

## 2021-05-26 DIAGNOSIS — F32A Depression, unspecified: Secondary | ICD-10-CM | POA: Diagnosis present

## 2021-05-26 DIAGNOSIS — E1122 Type 2 diabetes mellitus with diabetic chronic kidney disease: Secondary | ICD-10-CM | POA: Diagnosis present

## 2021-05-26 DIAGNOSIS — K7030 Alcoholic cirrhosis of liver without ascites: Secondary | ICD-10-CM | POA: Diagnosis present

## 2021-05-26 DIAGNOSIS — G319 Degenerative disease of nervous system, unspecified: Secondary | ICD-10-CM | POA: Diagnosis not present

## 2021-05-26 DIAGNOSIS — Z7984 Long term (current) use of oral hypoglycemic drugs: Secondary | ICD-10-CM

## 2021-05-26 DIAGNOSIS — G40909 Epilepsy, unspecified, not intractable, without status epilepticus: Principal | ICD-10-CM | POA: Diagnosis present

## 2021-05-26 DIAGNOSIS — Z9842 Cataract extraction status, left eye: Secondary | ICD-10-CM

## 2021-05-26 DIAGNOSIS — K7682 Hepatic encephalopathy: Secondary | ICD-10-CM | POA: Diagnosis not present

## 2021-05-26 DIAGNOSIS — Z961 Presence of intraocular lens: Secondary | ICD-10-CM | POA: Diagnosis present

## 2021-05-26 DIAGNOSIS — J3489 Other specified disorders of nose and nasal sinuses: Secondary | ICD-10-CM | POA: Diagnosis not present

## 2021-05-26 DIAGNOSIS — Z9841 Cataract extraction status, right eye: Secondary | ICD-10-CM

## 2021-05-26 DIAGNOSIS — I959 Hypotension, unspecified: Secondary | ICD-10-CM | POA: Diagnosis not present

## 2021-05-26 DIAGNOSIS — R4182 Altered mental status, unspecified: Secondary | ICD-10-CM | POA: Diagnosis not present

## 2021-05-26 DIAGNOSIS — Z794 Long term (current) use of insulin: Secondary | ICD-10-CM

## 2021-05-26 DIAGNOSIS — R413 Other amnesia: Secondary | ICD-10-CM | POA: Diagnosis not present

## 2021-05-26 DIAGNOSIS — N189 Chronic kidney disease, unspecified: Secondary | ICD-10-CM

## 2021-05-26 LAB — CBC WITH DIFFERENTIAL/PLATELET
Abs Immature Granulocytes: 0.06 10*3/uL (ref 0.00–0.07)
Basophils Absolute: 0.1 10*3/uL (ref 0.0–0.1)
Basophils Relative: 1 %
Eosinophils Absolute: 0 10*3/uL (ref 0.0–0.5)
Eosinophils Relative: 1 %
HCT: 32 % — ABNORMAL LOW (ref 36.0–46.0)
Hemoglobin: 10.3 g/dL — ABNORMAL LOW (ref 12.0–15.0)
Immature Granulocytes: 1 %
Lymphocytes Relative: 8 %
Lymphs Abs: 0.5 10*3/uL — ABNORMAL LOW (ref 0.7–4.0)
MCH: 31.6 pg (ref 26.0–34.0)
MCHC: 32.2 g/dL (ref 30.0–36.0)
MCV: 98.2 fL (ref 80.0–100.0)
Monocytes Absolute: 0.5 10*3/uL (ref 0.1–1.0)
Monocytes Relative: 8 %
Neutro Abs: 4.6 10*3/uL (ref 1.7–7.7)
Neutrophils Relative %: 81 %
Platelets: 66 10*3/uL — ABNORMAL LOW (ref 150–400)
RBC: 3.26 MIL/uL — ABNORMAL LOW (ref 3.87–5.11)
RDW: 17.2 % — ABNORMAL HIGH (ref 11.5–15.5)
WBC: 5.7 10*3/uL (ref 4.0–10.5)
nRBC: 0 % (ref 0.0–0.2)

## 2021-05-26 LAB — COMPREHENSIVE METABOLIC PANEL
ALT: 58 U/L — ABNORMAL HIGH (ref 0–44)
AST: 93 U/L — ABNORMAL HIGH (ref 15–41)
Albumin: 2.5 g/dL — ABNORMAL LOW (ref 3.5–5.0)
Alkaline Phosphatase: 102 U/L (ref 38–126)
Anion gap: 10 (ref 5–15)
BUN: 37 mg/dL — ABNORMAL HIGH (ref 8–23)
CO2: 23 mmol/L (ref 22–32)
Calcium: 8.8 mg/dL — ABNORMAL LOW (ref 8.9–10.3)
Chloride: 96 mmol/L — ABNORMAL LOW (ref 98–111)
Creatinine, Ser: 1.8 mg/dL — ABNORMAL HIGH (ref 0.44–1.00)
GFR, Estimated: 29 mL/min — ABNORMAL LOW (ref >60)
Glucose, Bld: 169 mg/dL — ABNORMAL HIGH (ref 70–99)
Potassium: 5.2 mmol/L — ABNORMAL HIGH (ref 3.5–5.1)
Sodium: 129 mmol/L — ABNORMAL LOW (ref 135–145)
Total Bilirubin: 1.9 mg/dL — ABNORMAL HIGH (ref 0.3–1.2)
Total Protein: 5.8 g/dL — ABNORMAL LOW (ref 6.5–8.1)

## 2021-05-26 LAB — URINALYSIS, ROUTINE W REFLEX MICROSCOPIC
Bilirubin Urine: NEGATIVE
Glucose, UA: NEGATIVE mg/dL
Ketones, ur: NEGATIVE mg/dL
Nitrite: NEGATIVE
Protein, ur: 100 mg/dL — AB
RBC / HPF: >50 RBC/hpf — ABNORMAL HIGH (ref 0–5)
Specific Gravity, Urine: 1.013 (ref 1.005–1.030)
WBC, UA: >50 WBC/hpf — ABNORMAL HIGH (ref 0–5)
pH: 6 (ref 5.0–8.0)

## 2021-05-26 LAB — RESP PANEL BY RT-PCR (FLU A&B, COVID) ARPGX2
Influenza A by PCR: NEGATIVE
Influenza B by PCR: NEGATIVE
SARS Coronavirus 2 by RT PCR: NEGATIVE

## 2021-05-26 LAB — AMMONIA: Ammonia: 119 umol/L — ABNORMAL HIGH (ref 9–35)

## 2021-05-26 LAB — GLUCOSE, CAPILLARY: Glucose-Capillary: 230 mg/dL — ABNORMAL HIGH (ref 70–99)

## 2021-05-26 MED ORDER — RIFAXIMIN 550 MG PO TABS
550.0000 mg | ORAL_TABLET | Freq: Two times a day (BID) | ORAL | Status: DC
  Administered 2021-05-26 – 2021-05-29 (×5): 550 mg via ORAL
  Filled 2021-05-26 (×6): qty 1

## 2021-05-26 MED ORDER — ONDANSETRON HCL 4 MG PO TABS: 4.0000 mg | ORAL_TABLET | Freq: Four times a day (QID) | ORAL | Status: DC | PRN

## 2021-05-26 MED ORDER — CEFTRIAXONE SODIUM 2 G IJ SOLR
2.0000 g | Freq: Once | INTRAMUSCULAR | Status: AC
  Administered 2021-05-26: 2 g via INTRAVENOUS
  Filled 2021-05-26: qty 20

## 2021-05-26 MED ORDER — ENSURE ENLIVE PO LIQD
237.0000 mL | Freq: Two times a day (BID) | ORAL | Status: DC
  Administered 2021-05-27 – 2021-05-28 (×2): 237 mL via ORAL

## 2021-05-26 MED ORDER — ATORVASTATIN CALCIUM 40 MG PO TABS
80.0000 mg | ORAL_TABLET | Freq: Every day | ORAL | Status: DC
Start: 2021-05-26 — End: 2021-05-29
  Administered 2021-05-26 – 2021-05-28 (×3): 80 mg via ORAL
  Filled 2021-05-26 (×3): qty 2

## 2021-05-26 MED ORDER — BUPROPION HCL ER (XL) 150 MG PO TB24
150.0000 mg | ORAL_TABLET | Freq: Every day | ORAL | Status: DC
Start: 2021-05-27 — End: 2021-05-29
  Administered 2021-05-27 – 2021-05-29 (×3): 150 mg via ORAL
  Filled 2021-05-26 (×3): qty 1

## 2021-05-26 MED ORDER — LACTULOSE 10 GM/15ML PO SOLN
10.0000 g | Freq: Three times a day (TID) | ORAL | Status: DC
  Administered 2021-05-26 – 2021-05-29 (×4): 10 g via ORAL
  Filled 2021-05-26 (×6): qty 30

## 2021-05-26 MED ORDER — INSULIN ASPART 100 UNIT/ML IJ SOLN
0.0000 [IU] | Freq: Three times a day (TID) | INTRAMUSCULAR | Status: DC
  Administered 2021-05-27: 3 [IU] via SUBCUTANEOUS
  Administered 2021-05-27: 5 [IU] via SUBCUTANEOUS
  Administered 2021-05-28 – 2021-05-29 (×3): 1 [IU] via SUBCUTANEOUS
  Administered 2021-05-29: 14:00:00 5 [IU] via SUBCUTANEOUS

## 2021-05-26 MED ORDER — LACTULOSE 10 GM/15ML PO SOLN
30.0000 g | Freq: Once | ORAL | Status: AC
  Administered 2021-05-26: 30 g via ORAL
  Filled 2021-05-26: qty 60

## 2021-05-26 MED ORDER — ONDANSETRON HCL 4 MG/2ML IJ SOLN
4.0000 mg | Freq: Four times a day (QID) | INTRAMUSCULAR | Status: DC | PRN
  Administered 2021-05-28: 4 mg via INTRAVENOUS
  Filled 2021-05-26: qty 2

## 2021-05-26 MED ORDER — ESCITALOPRAM OXALATE 10 MG PO TABS
20.0000 mg | ORAL_TABLET | Freq: Every day | ORAL | Status: DC
  Administered 2021-05-27 – 2021-05-29 (×3): 20 mg via ORAL
  Filled 2021-05-26 (×3): qty 2

## 2021-05-26 MED ORDER — ATENOLOL 25 MG PO TABS
50.0000 mg | ORAL_TABLET | Freq: Every day | ORAL | Status: DC
  Administered 2021-05-28: 50 mg via ORAL
  Filled 2021-05-26 (×3): qty 2

## 2021-05-26 MED ORDER — SODIUM CHLORIDE 0.9 % IV BOLUS
500.0000 mL | Freq: Once | INTRAVENOUS | Status: AC
  Administered 2021-05-26: 500 mL via INTRAVENOUS

## 2021-05-26 MED ORDER — SODIUM CHLORIDE 0.9 % IV SOLN: INTRAVENOUS | Status: AC

## 2021-05-26 MED ORDER — SODIUM CHLORIDE 0.9 % IV SOLN
1.0000 g | INTRAVENOUS | Status: DC
  Administered 2021-05-27 – 2021-05-28 (×2): 1 g via INTRAVENOUS
  Filled 2021-05-26 (×2): qty 10

## 2021-05-26 MED ORDER — RIFAXIMIN 550 MG PO TABS
550.0000 mg | ORAL_TABLET | Freq: Two times a day (BID) | ORAL | Status: DC
Start: 2021-05-26 — End: 2021-05-26

## 2021-05-26 MED ORDER — INSULIN ASPART 100 UNIT/ML IJ SOLN
0.0000 [IU] | Freq: Every day | INTRAMUSCULAR | Status: DC
  Administered 2021-05-26: 2 [IU] via SUBCUTANEOUS
  Administered 2021-05-28: 4 [IU] via SUBCUTANEOUS

## 2021-05-26 MED ORDER — ACETAMINOPHEN 325 MG PO TABS: 650.0000 mg | ORAL_TABLET | Freq: Four times a day (QID) | ORAL | Status: DC | PRN

## 2021-05-26 MED ORDER — PANTOPRAZOLE SODIUM 40 MG PO TBEC
40.0000 mg | DELAYED_RELEASE_TABLET | Freq: Every day | ORAL | Status: DC
  Administered 2021-05-27 – 2021-05-29 (×3): 40 mg via ORAL
  Filled 2021-05-26 (×3): qty 1

## 2021-05-26 MED ORDER — GABAPENTIN 300 MG PO CAPS
300.0000 mg | ORAL_CAPSULE | Freq: Every day | ORAL | Status: DC
  Administered 2021-05-26 – 2021-05-28 (×3): 300 mg via ORAL
  Filled 2021-05-26 (×3): qty 1

## 2021-05-26 MED ORDER — ACETAMINOPHEN 650 MG RE SUPP: 650.0000 mg | Freq: Four times a day (QID) | RECTAL | Status: DC | PRN

## 2021-05-26 MED ORDER — TRAZODONE HCL 50 MG PO TABS: 50.0000 mg | ORAL_TABLET | Freq: Every evening | ORAL | Status: DC | PRN

## 2021-05-26 NOTE — Assessment & Plan Note (Addendum)
-  Platelets around 66,000.  About baseline.   ?-Likely secondary to liver cirrhosis. ?-No signs of overt bleeding. ?

## 2021-05-26 NOTE — Assessment & Plan Note (Addendum)
-  Creatinine 1.8, baseline 0.7-1.2.   ?-In the setting of dehydration, continue use of nephrotoxic agents and UTI ?-Patient was fluid resuscitated UTI treated and at discharge renal function back to baseline.   ?-Safe to resume adjusted dose of diuretics.   ?-Patient with acute kidney injury on chronic kidney disease a stage IIIb  ? ?

## 2021-05-26 NOTE — Assessment & Plan Note (Addendum)
-  Resume adjusted dose of home antihypertensive agents.  Patient advised to maintain adequate hydration. ?-Reassess blood pressure at follow-up visit. ?

## 2021-05-26 NOTE — ED Notes (Signed)
Patient transported to CT 

## 2021-05-26 NOTE — Assessment & Plan Note (Addendum)
-  Sodium 129 on admission ?-After fluid resuscitation and holding diuretics sodium level 132. ?-Continue to follow electrolytes trend. ?-Patient advised to maintain adequate nutrition/hydration. ?

## 2021-05-26 NOTE — Assessment & Plan Note (Addendum)
-  Confusion, visual hallucinations, increased sleepiness.  CT without acute abnormality, ammonia level markedly elevated at 119.   ?-Work-up also with concerns for UTI as already mentioned. ?-Patient received fluid resuscitation, treatment for UTI and lactulose with significant improvement in her ammonia level.  Ammonia level at discharge 44 ?-Patient mentation back to baseline oriented x3 and following commands.  Will discharge home with family care and instructions to follow-up with PCP in 10 days.  ?-Prescription for cephalexin provided.  Patient will follow-up with gastroenterologist for any prior authorizations or further needs on this medication. ? ?

## 2021-05-26 NOTE — H&P (Addendum)
History and Physical    Brittany Russo BMW:[id number] DOB: 1945/05/22 DOA: 05/26/2021  PCP: Rosalee Kaufman, PA-C   Patient coming from: Home  Chief Complaint: Confusion  HPI: Brittany Russo is a 76 y.o. female with medical history significant for  Liver Cirrhosis, DM, Depression, HTN, Seizures.  Patient was brought to the ED from home with reports of change in mental status.  At time of my evaluation patient was status post lactulose and a bowel movement and per spouse patient has improved.  History is obtained from patient and husband at bedside. Patient's husband reports confusion and visual hallucinations started yesterday evening.  He also reports increased sleepiness.  Patient has been compliant with lactulose.  Having 3-4 bowel movements every day.  Yesterday she had up to 7 bowel movements.  Spouse reports 2 weeks ago for about- 3 days patient despite patient's compliance with her lactulose, she did not have any bowel movements.  Mental status remained intact and subsequently started having bowel movements.  Patient was also diagnosed with UTI, and completed a 10-day course of Macrobid 10 days ago.  Today she denies dysuria or nocturia.  No fevers no chills.  No vomiting.  .  ED Course: Temperature 98.  Heart rate 50s.  Respiratory rate 14-21.  Blood pressure systolic 272- 536.  Ammonia elevated at 119.  Sodium 129.  Platelets 66.  UA with large leukocytes and few bacteria.  ED consults acute intracranial abnormality, frontal lobe predominant and cerebral atrophy. Oral lactulose given. 560ms n/s bolus given.  2 g IV ceftriaxone given.  Review of Systems: As per HPI all other systems reviewed and negative.  Past Medical History:  Diagnosis Date   Acute and subacute hepatic failure without coma    Allergy    Anasarca    Anxiety    Arthritis    Ascites 09/2018   no SBP on 850 cc tap 10/13/18   Asthma    allery induced   [place] [place]   from NAFLD.  Dx ~  2004.  [facility]/     Depression    Diabetes mellitus without complication ([facility]    type 2   Eczema of both hands    Endometrial cancer ([facility] 1996, 2003   hysterectomy 1996.  recurrence 2003, treated with radiation.     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  Hyperlipidemia    Hypertension    Muscle weakness    [place]    last seizure June 07, 2014    Thrombocytopenia ([facility]    Thyroid nodule     Past Surgical History:  Procedure Laterality Date   AMPUTATION Right 06/29/2019   Procedure: AMPUTATION ABOVE KNEE through knee;  Surgeon: OParalee Cancel MD;  Location: [facility];  Service: Orthopedics;  Laterality: Right;  2 hrs   CATARACT EXTRACTION W/PHACO Left 06/04/2017   Procedure: CATARACT EXTRACTION PHACO AND INTRAOCULAR LENS PLACEMENT (IOC);  Surgeon: WBaruch Goldmann MD;  Location: [facility];  Service: Ophthalmology;  Laterality: Left;  CDE: 3.90   CATARACT EXTRACTION W/PHACO Right 07/15/2017   Procedure: CATARACT EXTRACTION PHACO AND INTRAOCULAR LENS PLACEMENT (IOC);  Surgeon: WBaruch Goldmann MD;  Location: [facility];  Service: Ophthalmology;  Laterality: Right;  CDE: 7.60   COLONOSCOPY  06/2007   [facility] hemat[facility].  radiation proctitis   DILATION AND  CURETTAGE OF UTERUS     ESOPHAGOGASTRODUODENOSCOPY  2016   ESOPHAGOGASTRODUODENOSCOPY (EGD) WITH PROPOFOL N/A 12/30/2018   Procedure: EGD;  Surgeon: Clarene Essex, MD;  [facility];  Service: Endoscopy;  Laterality: N/A;   EXCISIONAL TOTAL KNEE ARTHROPLASTY Right 01/03/2019   Procedure: EXCISIONAL TOTAL KNEE ARTHROPLASTY;  Surgeon: Paralee Cancel, MD;  [facility]: [facility];  Service: Orthopedics;  Laterality: Right;   FLEXIBLE SIGMOIDOSCOPY N/A 12/30/2018   Procedure: FLEXIBLE SIGMOIDOSCOPY;  Surgeon: Clarene Essex, MD;  [facility];  Service: Endoscopy;  Laterality: N/A;   IR PARACENTESIS   10/13/2018   IR PARACENTESIS  07/21/2019   IR THORACENTESIS ASP PLEURAL SPACE W/IMG GUIDE  07/19/2019   STERIOD INJECTION Left 06/29/2019   Procedure: LEFT KNEE INJECTION;  Surgeon: Paralee Cancel, MD;  [facility];  Service: Orthopedics;  Laterality: Left;   TOTAL ABDOMINAL HYSTERECTOMY  1996   TOTAL KNEE ARTHROPLASTY Right 09/22/2018   Procedure: TOTAL KNEE ARTHROPLASTY;  Surgeon: Paralee Cancel, MD;  [facility];  Service: Orthopedics;  Laterality: Right;  70 mins   TOTAL KNEE REVISION Right 09/27/2018   Procedure: TOTAL KNEE REVISION;  Surgeon: Paralee Cancel, MD;  [facility];  Service: Orthopedics;  Laterality: Right;   TOTAL KNEE REVISION Right 11/22/2018   Procedure: repair right knee extensor mechanism;  Surgeon: Paralee Cancel, MD;  Location: [facility];  Service: Orthopedics;  Laterality: Right;  90 mins     reports that she has never smoked. She has never used smokeless tobacco. She reports that she does not drink alcohol and does not use drugs.  Allergies  Allergen Reactions   Asa [Aspirin] Other (See Comments)    Pt not allergic but cannot take due to bleeding    Other Other (See Comments)    Any jewelry metal-hives and itching     Family History  Problem Relation Age of Onset   Heart disease Mother    Diabetes Mother    Heart disease Father    Cancer Father    Cancer Maternal Grandmother    Heart disease Maternal Grandfather    Prior to Admission medications   Medication Sig Start Date End Date Taking? Authorizing Provider  acetaminophen (TYLENOL) 325 MG tablet Take 1-2 tablets (325-650 mg total) by mouth every 4 (four) hours as needed for mild pain. 07/13/19  Yes Love, Ivan Anchors, PA-C  atenolol (TENORMIN) 25 MG tablet 2 tabs 12/05/14  Yes [provider]  mesalamine (CANASA) 1000 MG suppository 1 suppository at bedtime 08/16/19  Yes [provider]  Albuterol Sulfate (PROAIR RESPICLICK) 858 (90 Base) MCG/ACT AEPB 1 puff as needed Patient not taking:  Reported on 05/26/2021 05/24/14   [provider]  ALPRAZolam Duanne Moron) 0.25 MG tablet Take 1 tablet (0.25 mg total) by mouth daily as needed for anxiety. 04/11/21   Johnson, Clanford L, MD  ALPRAZolam Duanne Moron) 0.5 MG tablet 1 tablet    [provider]  atenolol (TENORMIN) 25 MG tablet Take 1 tablet (25 mg total) by mouth daily. 10/18/20   Roxan Hockey, MD  atorvastatin (LIPITOR) 80 MG tablet Take 80 mg by mouth daily at 8 pm.    [provider]  buPROPion (WELLBUTRIN XL) 150 MG 24 hr tablet Take 150 mg by mouth in the morning.     [provider]  escitalopram (LEXAPRO) 20 MG tablet Take 20 mg by mouth in the morning.  05/17/19   [provider]  ferrous sulfate (FERROUSUL) 325 (65 FE) MG tablet Take 1  tablet (325 mg total) by mouth 2 (two) times daily with a meal. 10/18/20 03/05/21  Deboraha Goar, Courage, MD  fluticasone (FLONASE) 50 MCG/ACT nasal spray Place 1 spray into both nostrils in the morning.    [provider]  furosemide (LASIX) 20 MG tablet Take 1 tablet (20 mg total) by mouth daily. Patient taking differently: Take 40 mg by mouth daily. 10/18/20   Roxan Hockey, MD  furosemide (LASIX) 40 MG tablet 1 tablet    [provider]  gabapentin (NEURONTIN) 300 MG capsule Take 300 mg by mouth at bedtime. 04/18/21   [provider]  insulin aspart (NOVOLOG FLEXPEN) 100 UNIT/ML FlexPen Inject 3-12 Units into the skin in the morning and at bedtime. Can be up to 8 units twice a day depending on blood sugar. Take mid afternoon only if bloodsugar  is over 180  Takes in evening if it is 200 or over give up to 10 units per daughter Sliding scale    [provider]  Insulin Glargine (BASAGLAR KWIKPEN) 100 UNIT/ML Inject 12 Units into the skin daily. 04/11/21   Johnson, Clanford L, MD  insulin glargine (LANTUS) 100 UNIT/ML injection 17 units    [provider]  lactulose (CHRONULAC) 10 GM/15ML solution Take 15 mLs (10 g total)  by mouth 3 (three) times daily. 04/11/21   Johnson, Clanford L, MD  Lactulose 20 GM/30ML SOLN 15- 30 ml    [provider]  mesalamine (CANASA) 1000 MG suppository Place 1 suppository rectally at bedtime.    [provider]  Multiple Vitamin (MULTIVITAMIN WITH MINERALS) TABS tablet Take 1 tablet by mouth daily. 12/06/18   Danae Orleans, PA-C  ondansetron (ZOFRAN-ODT) 4 MG disintegrating tablet Take 4 mg by mouth every 8 (eight) hours. 05/22/21   [provider]  pantoprazole (PROTONIX) 40 MG tablet Take 1 tablet (40 mg total) by mouth daily. 09/08/19   Orson Eva, MD  rifaximin (XIFAXAN) 550 MG TABS tablet Take 550 mg by mouth 2 (two) times daily.    [provider]  rifaximin (XIFAXAN) 550 MG TABS tablet 1 tablet    [provider]  sitaGLIPtin-metformin (JANUMET) 50-500 MG per tablet Take 1 tablet by mouth 2 (two) times daily with a meal.    [provider]  sitaGLIPtin-metformin (JANUMET) 50-500 MG tablet 1 tablet with meals    [provider]  spironolactone (ALDACTONE) 100 MG tablet Take 0.5 tablets (50 mg total) by mouth in the morning. 04/11/21   Johnson, Clanford L, MD  spironolactone (ALDACTONE) 100 MG tablet 1 tablet    [provider]  traZODone (DESYREL) 50 MG tablet Take 50 mg by mouth at bedtime as needed for sleep.  05/17/19   [provider]  Ibuprofen-Diphenhydramine Cit (ADVIL PM PO) Take 60 mg by mouth at bedtime.  05/21/17  [provider]    Physical Exam: Vitals:   05/26/21 1330 05/26/21 1400 05/26/21 1430 05/26/21 1500  BP: (!) 120/54 (!) 114/55 (!) 106/51 (!) 108/51  Pulse: (!) 54 (!) 57 (!) 55 (!) 56  Resp: (!) 21 14 17  (!) 22  Temp:      TempSrc:      SpO2: 100% 100% 100% 99%  Weight:      Height:        Constitutional: NAD, calm, comfortable Vitals:   05/26/21 1330 05/26/21 1400 05/26/21 1430 05/26/21 1500  BP: (!) 120/54 (!) 114/55 (!) 106/51 (!) 108/51  Pulse: (!) 54 (!) 57  (!) 55 (!)  56  Resp: (!) 21 14 17  (!) 22  Temp:      TempSrc:      SpO2: 100% 100% 100% 99%  Weight:      Height:       Eyes: PERRL, lids and conjunctivae normal ENMT: Mucous membranes are moist.   Neck: normal, supple, no masses, no thyromegaly Respiratory: clear to auscultation bilaterally, no wheezing, no crackles. Normal respiratory effort. No accessory muscle use.  Cardiovascular: Regular rate and rhythm, no murmurs / rubs / gallops.  No swelling to right lower extremity..  Abdomen: no tenderness, no masses palpated. No hepatosplenomegaly. Bowel sounds positive.  Musculoskeletal: no clubbing / cyanosis.  Right lower extremity amputation. Skin: no rashes, lesions, ulcers. No induration Neurologic: No apparent cranial nerve abnormalities, moving extremities spontaneously. Psychiatric: Slightly lethargic, otherwise awake alert oriented person place and situation unable to answer questions.  Labs on Admission: I have personally reviewed following labs and imaging studies  CBC: Recent Labs  Lab 05/26/21 1257  WBC 5.7  NEUTROABS 4.6  HGB 10.3*  HCT 32.0*  MCV 98.2  PLT 66*   Basic Metabolic Panel: Recent Labs  Lab 05/26/21 1257  NA 129*  K 5.2*  CL 96*  CO2 23  GLUCOSE 169*  BUN 37*  CREATININE 1.80*  CALCIUM 8.8*   GFR: Estimated Creatinine Clearance: 22 mL/min (A) (by C-G formula based on SCr of 1.8 mg/dL (H)). Liver Function Tests: Recent Labs  Lab 05/26/21 1257  AST 93*  ALT 58*  ALKPHOS 102  BILITOT 1.9*  PROT 5.8*  ALBUMIN 2.5*   No results for input(s): LIPASE, AMYLASE in the last 168 hours. Recent Labs  Lab 05/26/21 1335  AMMONIA 119*   Urine analysis:    Component Value Date/Time   COLORURINE AMBER (A) 05/26/2021 1257   APPEARANCEUR TURBID (A) 05/26/2021 1257   LABSPEC 1.013 05/26/2021 1257   PHURINE 6.0 05/26/2021 1257   GLUCOSEU NEGATIVE 05/26/2021 1257   HGBUR LARGE (A) 05/26/2021 1257   BILIRUBINUR NEGATIVE 05/26/2021 1257    BILIRUBINUR NEGATIVE 03/06/2013 1100   KETONESUR NEGATIVE 05/26/2021 1257   PROTEINUR 100 (A) 05/26/2021 1257   UROBILINOGEN negative 03/06/2013 1100   NITRITE NEGATIVE 05/26/2021 1257   LEUKOCYTESUR LARGE (A) 05/26/2021 1257    Radiological Exams on Admission: No results found.  EKG: None   Assessment/Plan Principal Problem:   Acute hepatic encephalopathy Active Problems:   UTI (urinary tract infection)   AKI (acute kidney injury) ([place])   Hypertension   Cirrhosis ([place])   Cirrhosis of liver with ascites (HCC)   Amputation above knee (HCC)   Controlled type 2 diabetes mellitus with hyperglycemia, with long-term current use of insulin (HCC)   Hyponatremia   Thrombocytopenia (HCC)   Assessment and Plan: * Acute hepatic encephalopathy Confusion, visual hallucinations, increased sleepiness.  CT without acute abnormality, ammonia level markedly elevated at 119.  Baseline appears to be 20s to 40s. -Patient has been compliant with lactulose which she takes 1-3 times daily, taking less doses if she has had too many bowel movements.  On average she has 3-4 bowel movements daily.  Yesterday she had up to 8 bowel movements. -Oral lactulose given in ED, continue -Start Xifaxan - 53m bolus given in Ed, hold further fluids -Addendum-daughter who handles patient's medication came by later and explained that patient has not gotten her Xifaxan in about a week because she ran out, and needs authorization to get it refilled.  Likely etiology of her acute hepatic encephalopathy.  UTI (urinary tract infection)  Recent UTI.  Completed 10-day course of antibiotics with Macrobid 2/25.  She denies urinary symptoms, but she is here with altered mental status.  She is afebrile without leukocytosis and does not meet sepsis criteria.  But UA -showing large leukocytes and greater than 50 WBCs. -Urine cultures from 10/09/2020 show intermediate resistance to nitrofurantoin. -Continue IV ceftriaxone started in  the ED -Follow-up urine cultures   AKI (acute kidney injury) ([place]) Creatinine 1.8, baseline 0.7-1.2.  She is on Lasix. -500 mill bolus given, continue N/s 75cc/hr x 12hrs -Hold Lasix and spironolactone for now  Thrombocytopenia ([place]) Platelets 66.  About baseline.  Likely secondary to liver cirrhosis.  Hyponatremia Sodium 129, baseline low 130s.  Likely secondary to liver cirrhosis, Lasix.  Controlled type 2 diabetes mellitus with hyperglycemia, with long-term current use of insulin (HCC) A1c 5.7.  Controlled. - SSi- S -Hold metformin and Januvia, lantus 17u daily  Hypertension  -  Hold further fluids, Resume atenolol, lactulose and Lasix   DVT prophylaxis: SCDs Code Status: Full code Family Communication: Spouse at bedside.  Also talked with daughter on the phone. Disposition Plan: ~ 1- 2 days Consults called: none Admission status: obs tele  Bethena Roys MD [facility]  05/26/2021, 10:09 PM

## 2021-05-26 NOTE — ED Notes (Signed)
Pt was getting overwhelmed and confused. Pts daughter was called ?

## 2021-05-26 NOTE — Assessment & Plan Note (Addendum)
-  Recent UTI.  Completed 10-day course of antibiotics with Macrobid 2/25.  ?-Currently not complaining of dysuria; but expressing some frequency.  ?-Given worsening mentation and prior cultures demonstrating intermediate resistant to antibiotic use patient has been treated with Rocephin initially and discharged on cefdinir. ?-Continue to maintain adequate hydration ?-Follow final repeat urine cultures during this hospitalization ? ? ?

## 2021-05-26 NOTE — ED Provider Notes (Signed)
[facility] EMERGENCY DEPARTMENT Provider Note   CSN: [id number] Arrival date & time: 05/26/21  1231     History  Chief Complaint  Patient presents with   Altered Mental Status    Brittany Russo is a 76 y.o. female.  Patient is brought into the emergency department because she has been more confused lately.  She has a history of cirrhosis  The history is provided by the patient and medical records. No language interpreter was used.  Altered Mental Status Presenting symptoms: behavior changes and confusion   Severity:  Moderate Most recent episode:  2 days ago Timing:  Constant Progression:  Waxing and waning Chronicity:  New Context: not dementia   Associated symptoms: no abdominal pain, no hallucinations, no headaches, no rash and no seizures       Home Medications Prior to Admission medications   Medication Sig Start Date End Date Taking? Authorizing Provider  acetaminophen (TYLENOL) 325 MG tablet Take 1-2 tablets (325-650 mg total) by mouth every 4 (four) hours as needed for mild pain. 07/13/19   Love, Ivan Anchors, PA-C  ALPRAZolam Duanne Moron) 0.25 MG tablet Take 1 tablet (0.25 mg total) by mouth daily as needed for anxiety. 04/11/21   Johnson, Clanford L, MD  atenolol (TENORMIN) 25 MG tablet Take 1 tablet (25 mg total) by mouth daily. 10/18/20   Roxan Hockey, MD  atorvastatin (LIPITOR) 80 MG tablet Take 80 mg by mouth daily at 8 pm.    [provider]  buPROPion (WELLBUTRIN XL) 150 MG 24 hr tablet Take 150 mg by mouth in the morning.     [provider]  escitalopram (LEXAPRO) 20 MG tablet Take 20 mg by mouth in the morning.  05/17/19   [provider]  ferrous sulfate (FERROUSUL) 325 (65 FE) MG tablet Take 1 tablet (325 mg total) by mouth 2 (two) times daily with a meal. 10/18/20 03/05/21  Emokpae, Courage, MD  fluticasone (FLONASE) 50 MCG/ACT nasal spray Place 1 spray into both nostrils in the morning.    [provider]  furosemide (LASIX)  20 MG tablet Take 1 tablet (20 mg total) by mouth daily. Patient taking differently: Take 40 mg by mouth daily. 10/18/20   Emokpae, Courage, MD  insulin aspart (NOVOLOG FLEXPEN) 100 UNIT/ML FlexPen Inject 3-12 Units into the skin in the morning and at bedtime. Can be up to 8 units twice a day depending on blood sugar. Take mid afternoon only if bloodsugar  is over 180  Takes in evening if it is 200 or over give up to 10 units per daughter Sliding scale    [provider]  Insulin Glargine (BASAGLAR KWIKPEN) 100 UNIT/ML Inject 12 Units into the skin daily. 04/11/21   Johnson, Clanford L, MD  lactulose (CHRONULAC) 10 GM/15ML solution Take 15 mLs (10 g total) by mouth 3 (three) times daily. 04/11/21   Johnson, Clanford L, MD  mesalamine (CANASA) 1000 MG suppository Place 1 suppository rectally at bedtime.    [provider]  Multiple Vitamin (MULTIVITAMIN WITH MINERALS) TABS tablet Take 1 tablet by mouth daily. 12/06/18   Danae Orleans, PA-C  pantoprazole (PROTONIX) 40 MG tablet Take 1 tablet (40 mg total) by mouth daily. 09/08/19   Orson Eva, MD  rifaximin (XIFAXAN) 550 MG TABS tablet Take 550 mg by mouth 2 (two) times daily.    [provider]  sitaGLIPtin-metformin (JANUMET) 50-500 MG per tablet Take 1 tablet by mouth 2 (two) times daily with a meal.  [provider]  spironolactone (ALDACTONE) 100 MG tablet Take 0.5 tablets (50 mg total) by mouth in the morning. 04/11/21   Johnson, Clanford L, MD  traZODone (DESYREL) 50 MG tablet Take 50 mg by mouth at bedtime as needed for sleep.  05/17/19   [provider]  Ibuprofen-Diphenhydramine Cit (ADVIL PM PO) Take 60 mg by mouth at bedtime.  05/21/17  [provider]      Allergies    Asa [aspirin] and Other    Review of Systems   Review of Systems  Constitutional:  Negative for appetite change and fatigue.  HENT:  Negative for congestion, ear discharge and sinus pressure.   Eyes:  Negative for  discharge.  Respiratory:  Negative for cough.   Cardiovascular:  Negative for chest pain.  Gastrointestinal:  Negative for abdominal pain and diarrhea.  Genitourinary:  Negative for frequency and hematuria.  Musculoskeletal:  Negative for back pain.  Skin:  Negative for rash.  Neurological:  Negative for seizures and headaches.  Psychiatric/Behavioral:  Positive for confusion. Negative for hallucinations.    Physical Exam Updated Vital Signs BP (!) 106/51    Pulse (!) 55    Temp 98 F (36.7 C) (Oral)    Resp 17    Ht 5' 2"  (1.575 m)    Wt 54.1 kg    SpO2 100%    BMI 21.82 kg/m  Physical Exam Vitals and nursing note reviewed.  Constitutional:      Appearance: She is well-developed.  HENT:     Head: Normocephalic.     Nose: Nose normal.  Eyes:     General: No scleral icterus.    Conjunctiva/sclera: Conjunctivae normal.  Neck:     Thyroid: No thyromegaly.  Cardiovascular:     Rate and Rhythm: Normal rate and regular rhythm.     Heart sounds: No murmur heard.   No friction rub. No gallop.  Pulmonary:     Breath sounds: No stridor. No wheezing or rales.  Chest:     Chest wall: No tenderness.  Abdominal:     General: There is no distension.     Tenderness: There is no abdominal tenderness. There is no rebound.  Musculoskeletal:        General: Normal range of motion.     Cervical back: Neck supple.  Lymphadenopathy:     Cervical: No cervical adenopathy.  Skin:    Findings: No erythema or rash.  Neurological:     Mental Status: She is alert.     Motor: No abnormal muscle tone.     Coordination: Coordination normal.     Comments: Patient is oriented to person and place but does not know the date  Psychiatric:        Behavior: Behavior normal.    ED Results / Procedures / Treatments   Labs (all labs ordered are listed, but only abnormal results are displayed) Labs Reviewed  URINALYSIS, ROUTINE W REFLEX MICROSCOPIC - Abnormal; Notable for the following components:       Result Value   Color, Urine AMBER (*)    APPearance TURBID (*)    Hgb urine dipstick LARGE (*)    Protein, ur 100 (*)    Leukocytes,Ua LARGE (*)    RBC / HPF >50 (*)    WBC, UA >50 (*)    Bacteria, UA FEW (*)    All other components within normal limits  CBC WITH DIFFERENTIAL/PLATELET - Abnormal; Notable for the following components:   RBC  3.26 (*)    Hemoglobin 10.3 (*)    HCT 32.0 (*)    RDW 17.2 (*)    Platelets 66 (*)    Lymphs Abs 0.5 (*)    All other components within normal limits  COMPREHENSIVE METABOLIC PANEL - Abnormal; Notable for the following components:   Sodium 129 (*)    Potassium 5.2 (*)    Chloride 96 (*)    Glucose, Bld 169 (*)    BUN 37 (*)    Creatinine, Ser 1.80 (*)    Calcium 8.8 (*)    Total Protein 5.8 (*)    Albumin 2.5 (*)    AST 93 (*)    ALT 58 (*)    Total Bilirubin 1.9 (*)    GFR, Estimated 29 (*)    All other components within normal limits  AMMONIA - Abnormal; Notable for the following components:   Ammonia 119 (*)    All other components within normal limits  URINE CULTURE    EKG None  Radiology No results found.  Procedures Procedures    Medications Ordered in ED Medications  lactulose (CHRONULAC) 10 GM/15ML solution 30 g (has no administration in time range)  sodium chloride 0.9 % bolus 500 mL (0 mLs Intravenous Stopped 05/26/21 1440)  cefTRIAXone (ROCEPHIN) 2 g in sodium chloride 0.9 % 100 mL IVPB (0 g Intravenous Stopped 05/26/21 1402)    ED Course/ Medical Decision Making/ A&P                           Medical Decision Making Amount and/or Complexity of Data Reviewed Labs: ordered. Radiology: ordered.  Risk Prescription drug management. Decision regarding hospitalization.  This patient presents to the ED for concern of confusion, this involves an extensive number of treatment options, and is a complaint that carries with it a high risk of complications and morbidity.  The differential diagnosis includes hepatic  encephalopathy, UTI, stroke   Co morbidities that complicate the patient evaluation  Hepatic disease with cirrhosis   Additional history obtained:  Additional history obtained from patient and family External records from outside source obtained and reviewed including hospital record   Lab Tests:  I Ordered, and personally interpreted labs.  The pertinent results include: CBC and chemistry which showed low sodium and increased potassium and elevated glucose also urinalysis shows UTI and ammonia levels 111   Imaging Studies ordered:  I ordered imaging studies including CT head I independently visualized and interpreted imaging which showed no acute problem I agree with the radiologist interpretation   Cardiac Monitoring:  The patient was maintained on a cardiac monitor.  I personally viewed and interpreted the cardiac monitored which showed an underlying rhythm of: Normal sinus rhythm   Medicines ordered and prescription drug management:  I ordered medication including Rocephin for UTI and lactulose for hepatic encephalopathy Reevaluation of the patient after these medicines showed that the patient stayed the same I have reviewed the patients home medicines and have made adjustments as needed   Test Considered:  MRI of brain   Critical Interventions:  Antibiotics IV   Consultations Obtained:  I requested consultation with the hospitalist,  and discussed lab and imaging findings as well as pertinent plan - they recommend: Admission and IV antibiotics and lactulose   Problem List / ED Course:  Hepatic encephalopathy and UTI    Social Determinants of Health:  None        Patient has hepatic  encephalopathy and UTI.  She will be admitted to medicine        Final Clinical Impression(s) / ED Diagnoses Final diagnoses:  Confusion    Rx / DC Orders ED Discharge Orders     None         Milton Ferguson, MD 05/27/21 1651

## 2021-05-26 NOTE — Assessment & Plan Note (Addendum)
-  A1c 5.7.  Demonstrating excellent control.   ?-Continue home hypoglycemic regimen. ?-Continue follow-up with PCP to further adjust treatment. ?

## 2021-05-26 NOTE — ED Triage Notes (Signed)
Pt presents to ED via [facility] for AMS. Daughter reports pt has been having visual hallucinations x 7 days. Pt recently treated for UTI. Pt also with decreased appetite. CBG 181 ?

## 2021-05-27 DIAGNOSIS — K219 Gastro-esophageal reflux disease without esophagitis: Secondary | ICD-10-CM | POA: Diagnosis not present

## 2021-05-27 DIAGNOSIS — E7220 Disorder of urea cycle metabolism, unspecified: Secondary | ICD-10-CM | POA: Diagnosis not present

## 2021-05-27 DIAGNOSIS — I10 Essential (primary) hypertension: Secondary | ICD-10-CM | POA: Diagnosis not present

## 2021-05-27 DIAGNOSIS — D696 Thrombocytopenia, unspecified: Secondary | ICD-10-CM | POA: Diagnosis not present

## 2021-05-27 DIAGNOSIS — Z794 Long term (current) use of insulin: Secondary | ICD-10-CM | POA: Diagnosis not present

## 2021-05-27 DIAGNOSIS — E871 Hypo-osmolality and hyponatremia: Secondary | ICD-10-CM | POA: Diagnosis not present

## 2021-05-27 DIAGNOSIS — K7460 Unspecified cirrhosis of liver: Secondary | ICD-10-CM

## 2021-05-27 DIAGNOSIS — K7682 Hepatic encephalopathy: Secondary | ICD-10-CM | POA: Diagnosis not present

## 2021-05-27 DIAGNOSIS — E1165 Type 2 diabetes mellitus with hyperglycemia: Secondary | ICD-10-CM | POA: Diagnosis not present

## 2021-05-27 DIAGNOSIS — N390 Urinary tract infection, site not specified: Secondary | ICD-10-CM | POA: Diagnosis not present

## 2021-05-27 LAB — AMMONIA: Ammonia: 44 umol/L — ABNORMAL HIGH (ref 9–35)

## 2021-05-27 LAB — BASIC METABOLIC PANEL
Anion gap: 8 (ref 5–15)
BUN: 34 mg/dL — ABNORMAL HIGH (ref 8–23)
CO2: 23 mmol/L (ref 22–32)
Calcium: 8.4 mg/dL — ABNORMAL LOW (ref 8.9–10.3)
Chloride: 101 mmol/L (ref 98–111)
Creatinine, Ser: 1.62 mg/dL — ABNORMAL HIGH (ref 0.44–1.00)
GFR, Estimated: 33 mL/min — ABNORMAL LOW (ref >60)
Glucose, Bld: 88 mg/dL (ref 70–99)
Potassium: 4.1 mmol/L (ref 3.5–5.1)
Sodium: 132 mmol/L — ABNORMAL LOW (ref 135–145)

## 2021-05-27 LAB — CBC
HCT: 30.9 % — ABNORMAL LOW (ref 36.0–46.0)
Hemoglobin: 9.9 g/dL — ABNORMAL LOW (ref 12.0–15.0)
MCH: 31.7 pg (ref 26.0–34.0)
MCHC: 32 g/dL (ref 30.0–36.0)
MCV: 99 fL (ref 80.0–100.0)
Platelets: 66 10*3/uL — ABNORMAL LOW (ref 150–400)
RBC: 3.12 MIL/uL — ABNORMAL LOW (ref 3.87–5.11)
RDW: 17.6 % — ABNORMAL HIGH (ref 11.5–15.5)
WBC: 5.7 10*3/uL (ref 4.0–10.5)
nRBC: 0 % (ref 0.0–0.2)

## 2021-05-27 LAB — GLUCOSE, CAPILLARY
Glucose-Capillary: 105 mg/dL — ABNORMAL HIGH (ref 70–99)
Glucose-Capillary: 213 mg/dL — ABNORMAL HIGH (ref 70–99)
Glucose-Capillary: 254 mg/dL — ABNORMAL HIGH (ref 70–99)
Glucose-Capillary: 186 mg/dL — ABNORMAL HIGH (ref 70–99)

## 2021-05-27 MED ORDER — CEFDINIR 300 MG PO CAPS: 300.0000 mg | ORAL_CAPSULE | Freq: Two times a day (BID) | ORAL | 0 refills | Status: DC

## 2021-05-27 MED ORDER — RIFAXIMIN 550 MG PO TABS: 550.0000 mg | ORAL_TABLET | Freq: Two times a day (BID) | ORAL | 2 refills | Status: AC

## 2021-05-27 NOTE — Assessment & Plan Note (Signed)
-   Continue patient follow-up with GI service. ?-Continue current dose of Lasix/spironolactone; continue beta-blocker and lactulose. ?-Patient also discharged on rifaximin ?

## 2021-05-27 NOTE — Progress Notes (Signed)
?  Transition of Care (TOC) Screening Note ? ? ?Patient Details  ?Name: Brittany Russo ?Date of Birth: Feb 02, 1946 ? ? ?Transition of Care (TOC) CM/SW Contact:    ?Boneta Lucks, RN ?Phone Number: ?05/27/2021, 10:59 AM ? ?Patient has Medicare and Medicaid for medications.  ? ?Transition of Care Department [facility]) has reviewed patient and no TOC needs have been identified at this time. We will continue to monitor patient advancement through interdisciplinary progression rounds. If new patient transition needs arise, please place a TOC consult. ? ? ?

## 2021-05-27 NOTE — Assessment & Plan Note (Signed)
Continue statin. 

## 2021-05-27 NOTE — Assessment & Plan Note (Signed)
Continue PPI ?

## 2021-05-27 NOTE — Discharge Summary (Signed)
Physician Discharge Summary   Patient: Brittany Russo MRN: [id number] DOB: 05-29-1945  Admit date:     05/26/2021  Discharge date: 05/27/21  Discharge Physician: Barton Dubois   PCP: Rosalee Kaufman, PA-C   Recommendations at discharge:  Reassess blood pressure and adjust antihypertensive regimen as required Continue close follow-up outpatient CBGs with further adjustment to hypoglycemic regimen as needed Repeat ammonia level and further adjust lactulose dose as needed Make sure patient has follow-up with GI service as recommended. Repeat basic metabolic panel to follow ultralights and renal function. Goals of care and advance care planning discussion recommended.  Discharge Diagnoses: Principal Problem:   Acute hepatic encephalopathy Active Problems:   UTI (urinary tract infection)   Acute kidney injury superimposed on CKD (HCC)   Hypertension   Hyperlipidemia   Cirrhosis (HCC)   Amputation above knee (HCC)   Controlled type 2 diabetes mellitus with hyperglycemia, with long-term current use of insulin (HCC)   Hyponatremia   Thrombocytopenia (HCC)   Gastroesophageal reflux disease  Hospital admission narrative: As per H&P written by Dr. Denton Brick on 05/26/2021 Brittany Russo is a 76 y.o. female with medical history significant for  Liver Cirrhosis, DM, Depression, HTN, Seizures.  Patient was brought to the ED from home with reports of change in mental status.  At time of my evaluation patient was status post lactulose and a bowel movement and per spouse patient has improved.  History is obtained from patient and husband at bedside.  Patient's husband reports confusion and visual hallucinations started yesterday evening.  He also reports increased sleepiness.  Patient has been compliant with lactulose. Having 3-4 bowel movements every day.  Yesterday she had up to 7 bowel movements.  Spouse reports 2 weeks ago for about- 3 days patient despite patient's compliance with her  lactulose, she did not have any bowel movements.  Mental status remained intact and subsequently started having bowel movements.   Patient was also diagnosed with UTI, and completed a 10-day course of Macrobid 10 days ago.  Today she denies dysuria or nocturia; respiratory frequency..  No fevers no chills.  No vomiting.  .   ED Course: Temperature 98.  Heart rate 50s.  Respiratory rate 14-21.  Blood pressure systolic 371- 062.  Ammonia elevated at 119.  Sodium 129.  Platelets 66.  UA with large leukocytes and few bacteria.  ED CT head without contrast demonstrating no acute intracranial abnormalities. Oral lactulose given. 548ms n/s bolus given.  2 g IV ceftriaxone given.  Assessment and Plan: * Acute hepatic encephalopathy -Confusion, visual hallucinations, increased sleepiness.  CT without acute abnormality, ammonia level markedly elevated at 119.   -Work-up also with concerns for UTI as already mentioned. -Patient received fluid resuscitation, treatment for UTI and lactulose with significant improvement in her ammonia level.  Ammonia level at discharge 44 -Patient mentation back to baseline oriented x3 and following commands.  Will discharge home with family care and instructions to follow-up with PCP in 10 days.  -Prescription for cephalexin provided.  Patient will follow-up with gastroenterologist for any prior authorizations or further needs on this medication.   UTI (urinary tract infection) -Recent UTI.  Completed 10-day course of antibiotics with Macrobid 2/25.  -Currently not complaining of dysuria; but expressing some frequency.  -Given worsening mentation and prior cultures demonstrating intermediate resistant to antibiotic use patient has been treated with Rocephin initially and discharged on cefdinir. -Continue to maintain adequate hydration -Follow final repeat urine cultures during this hospitalization  Acute kidney injury superimposed on CKD (HCC) -Creatinine 1.8,  baseline 0.7-1.2.   -In the setting of dehydration, continue use of nephrotoxic agents and UTI -Patient was fluid resuscitated UTI treated and at discharge renal function back to baseline.   -Safe to resume adjusted dose of diuretics.   -Patient with acute kidney injury on chronic kidney disease a stage IIIb    Gastroesophageal reflux disease -Continue PPI.  Thrombocytopenia (HCC) -Platelets around 66,000.  About baseline.   -Likely secondary to liver cirrhosis. -No signs of overt bleeding.  Hyponatremia -Sodium 129 on admission -After fluid resuscitation and holding diuretics sodium level 132. -Continue to follow electrolytes trend. -Patient advised to maintain adequate nutrition/hydration.  Controlled type 2 diabetes mellitus with hyperglycemia, with long-term current use of insulin (HCC) -A1c 5.7.  Demonstrating excellent control.   -Continue home hypoglycemic regimen. -Continue follow-up with PCP to further adjust treatment.  Cirrhosis ([place]) - Continue patient follow-up with GI service. -Continue current dose of Lasix/spironolactone; continue beta-blocker and lactulose. -Patient also discharged on rifaximin  Hyperlipidemia - Continue statin.  Hypertension  -Resume adjusted dose of home antihypertensive agents.  Patient advised to maintain adequate hydration. -Reassess blood pressure at follow-up visit.   Consultants: None Procedures performed: See below for x-ray reports. Disposition: Patient discharged home with instruction to follow-up with PCP and GI service. Diet recommendation: Heart healthy modified carbohydrate diet.  DISCHARGE MEDICATION: Allergies as of 05/27/2021       Reactions   Asa [aspirin] Other (See Comments)   Pt not allergic but cannot take due to bleeding    Other Other (See Comments)   Any jewelry metal-hives and itching         Medication List     TAKE these medications    acetaminophen 325 MG tablet Commonly known as: TYLENOL Take  1-2 tablets (325-650 mg total) by mouth every 4 (four) hours as needed for mild pain.   ALPRAZolam 0.25 MG tablet Commonly known as: XANAX Take 1 tablet (0.25 mg total) by mouth daily as needed for anxiety. What changed: when to take this   atenolol 25 MG tablet Commonly known as: TENORMIN Take 1 tablet (25 mg total) by mouth daily. What changed: how much to take   atorvastatin 80 MG tablet Commonly known as: LIPITOR Take 80 mg by mouth daily at 8 pm.   buPROPion 150 MG 24 hr tablet Commonly known as: WELLBUTRIN XL Take 150 mg by mouth in the morning.   cefdinir 300 MG capsule Commonly known as: OMNICEF Take 1 capsule (300 mg total) by mouth 2 (two) times daily for 3 days.   escitalopram 20 MG tablet Commonly known as: LEXAPRO Take 20 mg by mouth in the morning.   ferrous sulfate 325 (65 FE) MG tablet Commonly known as: FerrouSul Take 1 tablet (325 mg total) by mouth 2 (two) times daily with a meal.   fluticasone 50 MCG/ACT nasal spray Commonly known as: FLONASE Place 1 spray into both nostrils in the morning.   furosemide 20 MG tablet Commonly known as: LASIX Take 1 tablet (20 mg total) by mouth daily. What changed: how much to take   gabapentin 300 MG capsule Commonly known as: NEURONTIN Take 300 mg by mouth at bedtime.   insulin glargine 100 UNIT/ML injection Commonly known as: LANTUS 17 Units daily. What changed: Another medication with the same name was removed. Continue taking this medication, and follow the directions you see here.   lactulose 10 GM/15ML solution Commonly known as: CHRONULAC  Take 15 mLs (10 g total) by mouth 3 (three) times daily.   mesalamine 1000 MG suppository Commonly known as: CANASA Place 1 suppository rectally at bedtime.   multivitamin with minerals Tabs tablet Take 1 tablet by mouth daily.   NovoLOG FlexPen 100 UNIT/ML FlexPen Generic drug: insulin aspart Inject 3-12 Units into the skin in the morning and at bedtime. Can  be up to 8 units twice a day depending on blood sugar. Take mid afternoon only if bloodsugar  is over 180  Takes in evening if it is 200 or over give up to 10 units per daughter Sliding scale   ondansetron 4 MG disintegrating tablet Commonly known as: ZOFRAN-ODT Take 4 mg by mouth every 8 (eight) hours.   pantoprazole 40 MG tablet Commonly known as: PROTONIX Take 1 tablet (40 mg total) by mouth daily.   rifaximin 550 MG Tabs tablet Commonly known as: XIFAXAN Take 1 tablet (550 mg total) by mouth 2 (two) times daily.   sitaGLIPtin-metformin 50-500 MG tablet Commonly known as: JANUMET Take 1 tablet by mouth 2 (two) times daily with a meal.   spironolactone 100 MG tablet Commonly known as: ALDACTONE Take 0.5 tablets (50 mg total) by mouth in the morning. What changed:  how much to take when to take this   traZODone 50 MG tablet Commonly known as: DESYREL Take 50 mg by mouth at bedtime as needed for sleep.        Follow-up Information     Rosalee Kaufman, PA-C. Schedule an appointment as soon as possible for a visit in 10 day(s).   Specialty: Physician Assistant Contact information: [place] [address] [phone]         Arnoldo Lenis, MD .   Specialty: Cardiology Contact information: [place] [address] [phone]                Discharge Exam: Danley Danker Weights   05/26/21 1240 05/26/21 2038  Weight: 54.1 kg 50.6 kg   General exam: Alert, awake, oriented x 3; chronically ill in appearance; in no acute distress.  Tolerating diet without problems and feeling ready to go home. Respiratory system: Clear to auscultation. Respiratory effort normal.  No using accessory muscles.  Good saturation on room air. Cardiovascular system:RRR. No murmurs, rubs, gallops.  No JVD. Gastrointestinal system: Abdomen is nondistended, soft and nontender.  Currently without the presence of ascites.  No organomegaly or masses  felt. Normal bowel sounds heard. Central nervous system: Alert and oriented. No focal neurological deficits. Extremities: No cyanosis or clubbing. Skin: No petechiae. Psychiatry: Judgement and insight appear normal. Mood & affect appropriate.   The results of significant diagnostics from this hospitalization (including imaging, microbiology, ancillary and laboratory) are listed below for reference.   Imaging Studies: DG Chest 2 View  Result Date: 05/06/2021 CLINICAL DATA:  76 year old female with a history of alcoholic cirrhosis EXAM: CHEST - 2 VIEW COMPARISON:  04/21/2021 FINDINGS: Cardiomediastinal silhouette likely unchanged, with the right heart partially obscured by overlying lung/pleural disease. Opacity at the right lung base obscuring the right hemidiaphragm and the right heart border, with hazy opacity extending to the base. No pneumothorax. No left-sided pleural effusion. No interlobular septal thickening. No acute displaced fracture IMPRESSION: Recurrent opacity at the right lung base, compatible with combination of pleural effusion and associated atelectasis/consolidation Electronically Signed   By: Corrie Mckusick D.O.   On: 05/06/2021 14:22   CT Head Wo Contrast  Result Date: 05/26/2021 CLINICAL DATA:  Memory loss. EXAM: CT HEAD WITHOUT CONTRAST TECHNIQUE: Contiguous axial images were obtained from the base of the skull through the vertex without intravenous contrast. RADIATION DOSE REDUCTION: This exam was performed according to the departmental dose-optimization program which includes automated exposure control, adjustment of the mA and/or kV according to patient size and/or use of iterative reconstruction technique. COMPARISON:  Prior head CT examinations 10/16/2020 and earlier. FINDINGS: Brain: Frontal lobe predominant cerebral atrophy. Mild ill-defined hypoattenuation within the cerebral white matter, nonspecific but compatible chronic small vessel ischemic disease. There is no acute  intracranial hemorrhage. No demarcated cortical infarct. No extra-axial fluid collection. No evidence of an intracranial mass. No midline shift. Vascular: No hyperdense vessel.  Atherosclerotic calcifications. Skull: Normal. Negative for fracture or focal lesion. Sinuses/Orbits: Visualized orbits show no acute finding. Moderate scattered fluid within the right ethmoid air cells. Trace mucosal thickening within the left ethmoid air cells. Large fluid level within the right sphenoid sinus. Frothy secretions and moderate fluid level within the left sphenoid sinus. Small-to-moderate fluid levels within the maxillary sinuses at the imaged levels. IMPRESSION: No evidence of acute intracranial abnormality. Frontal lobe predominant cerebral atrophy. Mild chronic small vessel ischemic changes within the cerebral white matter. Paranasal sinus disease at the imaged levels, as described. Correlate for acute sinusitis. Electronically Signed   By: Kellie Simmering D.O.   On: 05/26/2021 16:20   US Abdomen Limited RUQ (LIVER/GB)  Result Date: 05/06/2021 CLINICAL DATA:  Alcoholic cirrhosis, unspecified whether ascites present ([place]) EXAM: ULTRASOUND ABDOMEN LIMITED RIGHT UPPER QUADRANT COMPARISON:  CT abdomen pelvis 01/08/2020 FINDINGS: Gallbladder: Non-mobile echogenic foci along the gallbladder wall measuring up to 3 mm. No gallbladder wall thickening visualized. No sonographic Murphy sign noted by sonographer. Common bile duct: Diameter: 5 mm. Liver: Nodular hepatic contour. No focal lesion identified. Coarsened heterogeneous parenchymal echogenicity. Portal vein is patent on color Doppler imaging with normal direction of blood flow towards the liver. Other: Small right pleural effusion. Trace to small volume free fluid within the abdomen. IMPRESSION: 1. Cirrhosis. No focal liver lesions identified by ultrasound. Please note that liver protocol enhanced MR and CT are the most sensitive tests for the screening detection of  hepatocellular carcinoma in the high risk setting of cirrhosis. 2. Small right pleural effusion. 3. Trace to small volume simple ascites. 4. A 3 mm gallbladder polyp. Follow-up right upper quadrant ultrasound in 6 months to evaluate for stability. Electronically Signed   By: Iven Finn M.D.   On: 05/06/2021 23:18    Microbiology: Results for orders placed or performed during the hospital encounter of 05/26/21  Resp Panel by RT-PCR (Flu A&B, Covid) Nasopharyngeal Swab     Status: None   Collection Time: 05/26/21  3:52 PM   Specimen: Nasopharyngeal Swab; Nasopharyngeal(NP) swabs in vial transport medium  Result Value Ref Range Status   SARS Coronavirus 2 by RT PCR NEGATIVE NEGATIVE Final    Comment: (NOTE) SARS-CoV-2 target nucleic acids are NOT DETECTED.  The SARS-CoV-2 RNA is generally detectable in upper respiratory specimens during the acute phase of infection. The lowest concentration of SARS-CoV-2 viral copies this assay can detect is 138 copies/mL. A negative result does not preclude SARS-Cov-2 infection and should not be used as the sole basis for treatment or other patient management decisions. A negative result may occur with  improper specimen collection/handling, submission of specimen other than nasopharyngeal swab, presence of viral mutation(s) within the areas targeted by this assay, and inadequate number of  viral copies(<138 copies/mL). A negative result must be combined with clinical observations, patient history, and epidemiological information. The expected result is Negative.  Fact Sheet for Patients:  EntrepreneurPulse.com.au  Fact Sheet for Healthcare Providers:  IncredibleEmployment.be  This test is no t yet approved or cleared by the [place] FDA and  has been authorized for detection and/or diagnosis of SARS-CoV-2 by FDA under an Emergency Use Authorization (EUA). This EUA will remain  in effect (meaning this test  can be used) for the duration of the COVID-19 declaration under Section 564(b)(1) of the Act, 21 U.S.C.section 360bbb-3(b)(1), unless the authorization is terminated  or revoked sooner.       Influenza A by PCR NEGATIVE NEGATIVE Final   Influenza B by PCR NEGATIVE NEGATIVE Final    Comment: (NOTE) The Xpert Xpress SARS-CoV-2/FLU/RSV plus assay is intended as an aid in the diagnosis of influenza from Nasopharyngeal swab specimens and should not be used as a sole basis for treatment. Nasal washings and aspirates are unacceptable for Xpert Xpress SARS-CoV-2/FLU/RSV testing.  Fact Sheet for Patients: EntrepreneurPulse.com.au  Fact Sheet for Healthcare Providers: IncredibleEmployment.be  This test is not yet approved or cleared by the [place] FDA and has been authorized for detection and/or diagnosis of SARS-CoV-2 by FDA under an Emergency Use Authorization (EUA). This EUA will remain in effect (meaning this test can be used) for the duration of the COVID-19 declaration under Section 564(b)(1) of the Act, 21 U.S.C. section 360bbb-3(b)(1), unless the authorization is terminated or revoked.  Performed at [facility], [address] [place], [place] [address]     Labs: CBC: Recent Labs  Lab 05/26/21 1257 05/27/21 0447  WBC 5.7 5.7  NEUTROABS 4.6  --   HGB 10.3* 9.9*  HCT 32.0* 30.9*  MCV 98.2 99.0  PLT 66* 66*   Basic Metabolic Panel: Recent Labs  Lab 05/26/21 1257 05/27/21 0447  NA 129* 132*  K 5.2* 4.1  CL 96* 101  CO2 23 23  GLUCOSE 169* 88  BUN 37* 34*  CREATININE 1.80* 1.62*  CALCIUM 8.8* 8.4*   Liver Function Tests: Recent Labs  Lab 05/26/21 1257  AST 93*  ALT 58*  ALKPHOS 102  BILITOT 1.9*  PROT 5.8*  ALBUMIN 2.5*   CBG: Recent Labs  Lab 05/26/21 2141 05/27/21 0722 05/27/21 1136  GLUCAP 230* 105* 213*    Discharge time spent: greater than 30 minutes.  Signed: Barton Dubois, MD [facility] 05/27/2021

## 2021-05-27 NOTE — Plan of Care (Signed)

## 2021-05-28 ENCOUNTER — Observation Stay (HOSPITAL_COMMUNITY): Payer: Medicare Other

## 2021-05-28 ENCOUNTER — Inpatient Hospital Stay (HOSPITAL_COMMUNITY)
Admit: 2021-05-28 | Discharge: 2021-05-28 | Disposition: A | Discharge status: Home / Self Care | Payer: Medicare Other | Attending: Family Medicine | Admitting: Family Medicine

## 2021-05-28 DIAGNOSIS — G319 Degenerative disease of nervous system, unspecified: Secondary | ICD-10-CM | POA: Diagnosis present

## 2021-05-28 DIAGNOSIS — Z833 Family history of diabetes mellitus: Secondary | ICD-10-CM | POA: Diagnosis not present

## 2021-05-28 DIAGNOSIS — E860 Dehydration: Secondary | ICD-10-CM | POA: Diagnosis present

## 2021-05-28 DIAGNOSIS — K7682 Hepatic encephalopathy: Secondary | ICD-10-CM | POA: Diagnosis present

## 2021-05-28 DIAGNOSIS — R4182 Altered mental status, unspecified: Secondary | ICD-10-CM | POA: Diagnosis not present

## 2021-05-28 DIAGNOSIS — E1165 Type 2 diabetes mellitus with hyperglycemia: Secondary | ICD-10-CM | POA: Diagnosis present

## 2021-05-28 DIAGNOSIS — Z20822 Contact with and (suspected) exposure to covid-19: Secondary | ICD-10-CM | POA: Diagnosis present

## 2021-05-28 DIAGNOSIS — N179 Acute kidney failure, unspecified: Secondary | ICD-10-CM | POA: Diagnosis present

## 2021-05-28 DIAGNOSIS — Z9842 Cataract extraction status, left eye: Secondary | ICD-10-CM | POA: Diagnosis not present

## 2021-05-28 DIAGNOSIS — G40909 Epilepsy, unspecified, not intractable, without status epilepticus: Secondary | ICD-10-CM | POA: Diagnosis present

## 2021-05-28 DIAGNOSIS — I129 Hypertensive chronic kidney disease with stage 1 through stage 4 chronic kidney disease, or unspecified chronic kidney disease: Secondary | ICD-10-CM | POA: Diagnosis present

## 2021-05-28 DIAGNOSIS — Z8249 Family history of ischemic heart disease and other diseases of the circulatory system: Secondary | ICD-10-CM | POA: Diagnosis not present

## 2021-05-28 DIAGNOSIS — Z794 Long term (current) use of insulin: Secondary | ICD-10-CM | POA: Diagnosis not present

## 2021-05-28 DIAGNOSIS — K7030 Alcoholic cirrhosis of liver without ascites: Secondary | ICD-10-CM | POA: Diagnosis present

## 2021-05-28 DIAGNOSIS — F32A Depression, unspecified: Secondary | ICD-10-CM | POA: Diagnosis present

## 2021-05-28 DIAGNOSIS — R569 Unspecified convulsions: Secondary | ICD-10-CM | POA: Diagnosis not present

## 2021-05-28 DIAGNOSIS — E785 Hyperlipidemia, unspecified: Secondary | ICD-10-CM | POA: Diagnosis present

## 2021-05-28 DIAGNOSIS — N390 Urinary tract infection, site not specified: Secondary | ICD-10-CM | POA: Diagnosis present

## 2021-05-28 DIAGNOSIS — D61818 Other pancytopenia: Secondary | ICD-10-CM | POA: Diagnosis present

## 2021-05-28 DIAGNOSIS — B9689 Other specified bacterial agents as the cause of diseases classified elsewhere: Secondary | ICD-10-CM | POA: Diagnosis present

## 2021-05-28 DIAGNOSIS — B952 Enterococcus as the cause of diseases classified elsewhere: Secondary | ICD-10-CM | POA: Diagnosis present

## 2021-05-28 DIAGNOSIS — J3489 Other specified disorders of nose and nasal sinuses: Secondary | ICD-10-CM | POA: Diagnosis not present

## 2021-05-28 DIAGNOSIS — E1122 Type 2 diabetes mellitus with diabetic chronic kidney disease: Secondary | ICD-10-CM | POA: Diagnosis present

## 2021-05-28 DIAGNOSIS — K219 Gastro-esophageal reflux disease without esophagitis: Secondary | ICD-10-CM | POA: Diagnosis present

## 2021-05-28 DIAGNOSIS — N1832 Chronic kidney disease, stage 3b: Secondary | ICD-10-CM | POA: Diagnosis present

## 2021-05-28 DIAGNOSIS — E871 Hypo-osmolality and hyponatremia: Secondary | ICD-10-CM | POA: Diagnosis present

## 2021-05-28 DIAGNOSIS — Z89611 Acquired absence of right leg above knee: Secondary | ICD-10-CM | POA: Diagnosis not present

## 2021-05-28 LAB — COMPREHENSIVE METABOLIC PANEL
ALT: 59 U/L — ABNORMAL HIGH (ref 0–44)
AST: 88 U/L — ABNORMAL HIGH (ref 15–41)
Albumin: 2.5 g/dL — ABNORMAL LOW (ref 3.5–5.0)
Alkaline Phosphatase: 96 U/L (ref 38–126)
Anion gap: 8 (ref 5–15)
BUN: 30 mg/dL — ABNORMAL HIGH (ref 8–23)
CO2: 21 mmol/L — ABNORMAL LOW (ref 22–32)
Calcium: 8.4 mg/dL — ABNORMAL LOW (ref 8.9–10.3)
Chloride: 97 mmol/L — ABNORMAL LOW (ref 98–111)
Creatinine, Ser: 1.4 mg/dL — ABNORMAL HIGH (ref 0.44–1.00)
GFR, Estimated: 40 mL/min — ABNORMAL LOW (ref >60)
Glucose, Bld: 148 mg/dL — ABNORMAL HIGH (ref 70–99)
Potassium: 4.2 mmol/L (ref 3.5–5.1)
Sodium: 126 mmol/L — ABNORMAL LOW (ref 135–145)
Total Bilirubin: 1.6 mg/dL — ABNORMAL HIGH (ref 0.3–1.2)
Total Protein: 5.8 g/dL — ABNORMAL LOW (ref 6.5–8.1)

## 2021-05-28 LAB — CBC
HCT: 31.6 % — ABNORMAL LOW (ref 36.0–46.0)
Hemoglobin: 10.9 g/dL — ABNORMAL LOW (ref 12.0–15.0)
MCH: 33.5 pg (ref 26.0–34.0)
MCHC: 34.5 g/dL (ref 30.0–36.0)
MCV: 97.2 fL (ref 80.0–100.0)
Platelets: 78 10*3/uL — ABNORMAL LOW (ref 150–400)
RBC: 3.25 MIL/uL — ABNORMAL LOW (ref 3.87–5.11)
RDW: 17.1 % — ABNORMAL HIGH (ref 11.5–15.5)
WBC: 6.7 10*3/uL (ref 4.0–10.5)
nRBC: 0 % (ref 0.0–0.2)

## 2021-05-28 LAB — GLUCOSE, CAPILLARY
Glucose-Capillary: 122 mg/dL — ABNORMAL HIGH (ref 70–99)
Glucose-Capillary: 134 mg/dL — ABNORMAL HIGH (ref 70–99)
Glucose-Capillary: 113 mg/dL — ABNORMAL HIGH (ref 70–99)
Glucose-Capillary: 314 mg/dL — ABNORMAL HIGH (ref 70–99)

## 2021-05-28 LAB — AMMONIA: Ammonia: 36 umol/L — ABNORMAL HIGH (ref 9–35)

## 2021-05-28 LAB — PROTIME-INR
INR: 1.4 — ABNORMAL HIGH (ref 0.8–1.2)
Prothrombin Time: 17.2 seconds — ABNORMAL HIGH (ref 11.4–15.2)

## 2021-05-28 MED ORDER — DEXTROSE-NACL 5-0.9 % IV SOLN: INTRAVENOUS | Status: AC

## 2021-05-28 MED ORDER — ALPRAZOLAM 0.25 MG PO TABS
0.2500 mg | ORAL_TABLET | Freq: Two times a day (BID) | ORAL | Status: DC | PRN
  Administered 2021-05-29: 11:00:00 0.25 mg via ORAL
  Filled 2021-05-28: qty 1

## 2021-05-28 MED ORDER — DEXTROSE-NACL 5-0.45 % IV SOLN
INTRAVENOUS | Status: DC
Start: 2021-05-28 — End: 2021-05-28

## 2021-05-28 MED ORDER — LORAZEPAM 2 MG/ML IJ SOLN: 1.0000 mg | Freq: Once | INTRAMUSCULAR | Status: AC

## 2021-05-28 MED ORDER — ALPRAZOLAM 0.25 MG PO TABS
0.2500 mg | ORAL_TABLET | Freq: Three times a day (TID) | ORAL | Status: DC | PRN
  Administered 2021-05-28: 0.25 mg via ORAL
  Filled 2021-05-28: qty 1

## 2021-05-28 MED ORDER — LORAZEPAM 2 MG/ML IJ SOLN
1.0000 mg | Freq: Four times a day (QID) | INTRAMUSCULAR | Status: DC | PRN
  Administered 2021-05-28: 1 mg via INTRAVENOUS
  Filled 2021-05-28: qty 1

## 2021-05-28 MED ORDER — LORAZEPAM 2 MG/ML IJ SOLN
INTRAMUSCULAR | Status: AC
  Administered 2021-05-28: 1 mg via INTRAMUSCULAR
  Filled 2021-05-28: qty 1

## 2021-05-28 MED ORDER — SODIUM CHLORIDE 0.9 % IV SOLN
INTRAVENOUS | Status: DC
Start: 2021-05-28 — End: 2021-05-29

## 2021-05-28 NOTE — Progress Notes (Addendum)
0955: Pt's daughter heard yelling in hallway, arrived to room to find pt actively seizing. Pt placed into recovery position, SaO2 100%. Seizure activity lasted approx 3-4 minutes, then pt post-ictal with snoring respirations. MD Courage at bedside and also witnessed seizure activity.  ?1005: Order received for Ativan 6m IM, med given per order for seizure.  ?1008: Post seizure VSS stable, b/p 154/74, HR 72, resp16, SaO2 100% on RA. Pt placed on telemetry showing SR 70's. Order received for CT head and additional 168mAtivan IV pre-med for scan.  ?1018: IV access obtained ?1020:Repeat VSS stable, b/p  127/58, HR 67, resp 16 and nonlabored, SaO2 100% on RA. ?1023: Ativan IV given per order pre-scan. ?1030: Pt being transported via bed to radiology for CT scan. ?

## 2021-05-28 NOTE — Progress Notes (Signed)
Pt able to take most of am meds, pills whole with water, swallowed well with no difficulty. Pt unable to take xifaxam tablet and refused lactulose. ?Pt follows commands, but makes no direct eye contact. No verbal response to questions by staff or family, except to say, "no" when offered lactulose. ?

## 2021-05-28 NOTE — Progress Notes (Signed)
PROGRESS NOTE     Brittany Russo, is a 76 y.o. female, DOB - 05-12-1945, DGL:[id number]  Admit date - 05/26/2021   Admitting Physician Maydelin Deming Denton Brick, MD  Outpatient Primary MD for the patient is Rosalee Kaufman, PA-C  LOS - 0  Chief Complaint  Patient presents with   Altered Mental Status        -Assessment and Plan: *  1) seizure episode -Patient was noticed to be having tonic-clonic seizures on 05/28/2021 -Family reports prior history of seizures about 7 years ago related to benzodiazepine withdrawal at the time -Neurology consult appreciated -EEG nonrevealing -CT head and brain MRI without new acute findings -Per neurologist continue PTA gabapentin for now hold off on Keppra unless patient has another seizure episode -Benzodiazepines as needed -Consider tapering off Wellbutrin  2)Acute hepatic encephalopathy --ammonia levels continue to improve -Mentation had improved prior to seizure episode -Lactulose as patient -Rifaximin if able to get insurance approval   UTI (urinary tract infection) -Recent UTI.  Completed 10-day course of antibiotics with Macrobid 2/25.  -Urine culture from 05/26/2021 with gram-negative rods final ID pending -Continue IV Rocephin   Acute kidney injury superimposed on CKD (HCC) -Creatinine 1.8, baseline 0.7-1.2.   -In the setting of dehydration, continue use of nephrotoxic agents and UTI -Patient was fluid resuscitated UTI treated and at discharge renal function back to baseline.   -Safe to resume adjusted dose of diuretics.   -Patient with acute kidney injury on chronic kidney disease a stage IIIb   Gastroesophageal reflux disease -Continue PPI.  Thrombocytopenia (HCC) -Platelets around 66,000.  About baseline.   -Likely secondary to liver cirrhosis. -No signs of overt bleeding.  Hyponatremia  -Continue to follow electrolytes trend. -Patient advised to maintain adequate nutrition/hydration. -Patient is trending back down  patient with poor p.o. intake due to seizures and need for Ativan -Give D5 normal  Controlled type 2 diabetes mellitus with hyperglycemia, with long-term current use of insulin (HCC) -A1c 5.7.  Demonstrating excellent control.   -Continue home hypoglycemic regimen. -Continue follow-up with PCP to further adjust treatment.  Cirrhosis ([place]) - Continue patient follow-up with GI service. -Continue current dose of Lasix/spironolactone; continue beta-blocker and lactulose. -Patient also discharged on rifaximin if gets insurance approval  Hyperlipidemia - Continue statin.  Hypertension  -Resume adjusted dose of home antihypertensive agents.  Patient advised to maintain adequate hydration. -Reassess blood pressure at follow-up visit.  Disposition/Need for in-Hospital Stay- patient unable to be discharged at this time due to --new onset seizures requiring Ativan and further work-up and investigations, worsening hyponatremia requiring D5 normal saline in the setting of poor oral intake due to seizures and Ativan use  Status is: Inpatient   Disposition: The patient is from: Home              Anticipated d/c is to: Home              Anticipated d/c date is: 1 day              Patient currently is not medically stable to d/c. Barriers: Not Clinically Stable-   Code Status :  -  Code Status: Full Code   Family Communication:    NA (patient is alert, awake and coherent)   DVT Prophylaxis  :   - SCDs   SCDs Start: 05/26/21 2039   Lab Results  Component Value Date   PLT 78 (L) 05/28/2021    Inpatient Medications  Scheduled Meds:  atenolol  50 mg Oral  Daily   atorvastatin  80 mg Oral Q2000   buPROPion  150 mg Oral Daily   escitalopram  20 mg Oral Daily   feeding supplement  237 mL Oral BID BM   gabapentin  300 mg Oral QHS   insulin aspart  0-5 Units Subcutaneous QHS   insulin aspart  0-9 Units Subcutaneous TID WC   lactulose  10 g Oral TID   pantoprazole  40 mg Oral Daily    rifaximin  550 mg Oral BID   Continuous Infusions:  cefTRIAXone (ROCEPHIN)  IV 1 g (05/28/21 1415)   dextrose 5 % and 0.45% NaCl     PRN Meds:.acetaminophen **OR** acetaminophen, ALPRAZolam, LORazepam, ondansetron **OR** ondansetron (ZOFRAN) IV, traZODone   Anti-infectives (From admission, onward)    Start     Dose/Rate Route Frequency Ordered Stop   05/27/21 1300  cefTRIAXone (ROCEPHIN) 1 g in sodium chloride 0.9 % 100 mL IVPB        1 g 200 mL/hr over 30 Minutes Intravenous Every 24 hours 05/26/21 2038     05/27/21 0000  rifaximin (XIFAXAN) 550 MG TABS tablet        550 mg Oral 2 times daily 05/27/21 1344     05/27/21 0000  cefdinir (OMNICEF) 300 MG capsule        300 mg Oral 2 times daily 05/27/21 1344 05/30/21 2359   05/26/21 2200  rifaximin (XIFAXAN) tablet 550 mg  Status:  Discontinued        550 mg Oral 2 times daily 05/26/21 1901 05/26/21 1903   05/26/21 2000  rifaximin (XIFAXAN) tablet 550 mg        550 mg Oral 2 times daily 05/26/21 1903     05/26/21 1330  cefTRIAXone (ROCEPHIN) 2 g in sodium chloride 0.9 % 100 mL IVPB        2 g 200 mL/hr over 30 Minutes Intravenous  Once 05/26/21 1321 05/26/21 1402         Subjective: Brittany Russo today has no fevers, no emesis,  No chest pain,   Patient's 2 daughters and husband at bedside -Had episode of tonic-clonic seizures -Responded to Ativan -Mostly sleepy for the rest of the day  Objective: Vitals:   05/28/21 1020 05/28/21 1221 05/28/21 1421 05/28/21 1638  BP: (!) 127/58 (!) 104/46 109/68 (!) 102/51  Pulse: 67 (!) 56 (!) 57 (!) 55  Resp: 16 16 15 16   Temp:  98.6 F (37 C) 98.3 F (36.8 C) 97.9 F (36.6 C)  TempSrc:  Oral Oral Oral  SpO2: 100% 98% 98% 100%  Weight:      Height:        Intake/Output Summary (Last 24 hours) at 05/28/2021 1802 Last data filed at 05/28/2021 1300 Gross per 24 hour  Intake 230 ml  Output 300 ml  Net -70 ml   Filed Weights   05/26/21 1240 05/26/21 2038  Weight: 54.1 kg 50.6 kg     Physical Exam  Gen:-Sleepy after Ativan HEENT:- [place].AT, No sclera icterus Neck-Supple Neck,No JVD,.  Lungs-  CTAB , fair symmetrical air movement CV- S1, S2 normal, regular  Abd-  +ve B.Sounds, Abd Soft, No tenderness,    Psych-affect is appropriate, oriented x3 Neuro-sleepy after seizures and Ativan MSK-right AKA  Data Reviewed: I have personally reviewed following labs and imaging studies  CBC: Recent Labs  Lab 05/26/21 1257 05/27/21 0447 05/28/21 0915  WBC 5.7 5.7 6.7  NEUTROABS 4.6  --   --   HGB  10.3* 9.9* 10.9*  HCT 32.0* 30.9* 31.6*  MCV 98.2 99.0 97.2  PLT 66* 66* 78*   Basic Metabolic Panel: Recent Labs  Lab 05/26/21 1257 05/27/21 0447 05/28/21 0915  NA 129* 132* 126*  K 5.2* 4.1 4.2  CL 96* 101 97*  CO2 23 23 21*  GLUCOSE 169* 88 148*  BUN 37* 34* 30*  CREATININE 1.80* 1.62* 1.40*  CALCIUM 8.8* 8.4* 8.4*   GFR: Estimated Creatinine Clearance: 28.3 mL/min (A) (by C-G formula based on SCr of 1.4 mg/dL (H)). Liver Function Tests: Recent Labs  Lab 05/26/21 1257 05/28/21 0915  AST 93* 88*  ALT 58* 59*  ALKPHOS 102 96  BILITOT 1.9* 1.6*  PROT 5.8* 5.8*  ALBUMIN 2.5* 2.5*   Cardiac Enzymes: No results for input(s): CKTOTAL, CKMB, CKMBINDEX, TROPONINI in the last 168 hours. BNP (last 3 results) No results for input(s): PROBNP in the last 8760 hours. HbA1C: No results for input(s): HGBA1C in the last 72 hours. Sepsis Labs: @LABRCNTIP (procalcitonin:4,lacticidven:4) ) Recent Results (from the past 240 hour(s))  Urine Culture     Status: Abnormal (Preliminary result)   Collection Time: 05/26/21 12:57 PM   Specimen: Urine, Clean Catch  Result Value Ref Range Status   Specimen Description   Final    URINE, CLEAN CATCH Performed at [facility], [address] [place], [place] [address]    Special Requests   Final    NONE Performed at [facility], [address], [place], [place] [address]    Culture (A)  Final    >=100,000 COLONIES/mL  GRAM NEGATIVE RODS SUSCEPTIBILITIES TO FOLLOW CULTURE REINCUBATED FOR BETTER GROWTH Performed at [facility], [address] [place], [place] [address]    Report Status PENDING  Incomplete  Resp Panel by RT-PCR (Flu A&B, Covid) Nasopharyngeal Swab     Status: None   Collection Time: 05/26/21  3:52 PM   Specimen: Nasopharyngeal Swab; Nasopharyngeal(NP) swabs in vial transport medium  Result Value Ref Range Status   SARS Coronavirus 2 by RT PCR NEGATIVE NEGATIVE Final    Comment: (NOTE) SARS-CoV-2 target nucleic acids are NOT DETECTED.  The SARS-CoV-2 RNA is generally detectable in upper respiratory specimens during the acute phase of infection. The lowest concentration of SARS-CoV-2 viral copies this assay can detect is 138 copies/mL. A negative result does not preclude SARS-Cov-2 infection and should not be used as the sole basis for treatment or other patient management decisions. A negative result may occur with  improper specimen collection/handling, submission of specimen other than nasopharyngeal swab, presence of viral mutation(s) within the areas targeted by this assay, and inadequate number of viral copies(<138 copies/mL). A negative result must be combined with clinical observations, patient history, and epidemiological information. The expected result is Negative.  Fact Sheet for Patients:  EntrepreneurPulse.com.au  Fact Sheet for Healthcare Providers:  IncredibleEmployment.be  This test is no t yet approved or cleared by the [place] FDA and  has been authorized for detection and/or diagnosis of SARS-CoV-2 by FDA under an Emergency Use Authorization (EUA). This EUA will remain  in effect (meaning this test can be used) for the duration of the COVID-19 declaration under Section 564(b)(1) of the Act, 21 U.S.C.section 360bbb-3(b)(1), unless the authorization is terminated  or revoked sooner.       Influenza A by PCR  NEGATIVE NEGATIVE Final   Influenza B by PCR NEGATIVE NEGATIVE Final    Comment: (NOTE) The Xpert Xpress SARS-CoV-2/FLU/RSV plus assay is intended as an  aid in the diagnosis of influenza from Nasopharyngeal swab specimens and should not be used as a sole basis for treatment. Nasal washings and aspirates are unacceptable for Xpert Xpress SARS-CoV-2/FLU/RSV testing.  Fact Sheet for Patients: EntrepreneurPulse.com.au  Fact Sheet for Healthcare Providers: IncredibleEmployment.be  This test is not yet approved or cleared by the [place] FDA and has been authorized for detection and/or diagnosis of SARS-CoV-2 by FDA under an Emergency Use Authorization (EUA). This EUA will remain in effect (meaning this test can be used) for the duration of the COVID-19 declaration under Section 564(b)(1) of the Act, 21 U.S.C. section 360bbb-3(b)(1), unless the authorization is terminated or revoked.  Performed at [facility], [address], [place], [place] [address]       Radiology Studies: CT HEAD WO CONTRAST (5MM)  Result Date: 05/28/2021 CLINICAL DATA:  Seizure, new onset, no history of trauma. Mental status change. EXAM: CT HEAD WITHOUT CONTRAST TECHNIQUE: Contiguous axial images were obtained from the base of the skull through the vertex without intravenous contrast. RADIATION DOSE REDUCTION: This exam was performed according to the departmental dose-optimization program which includes automated exposure control, adjustment of the mA and/or kV according to patient size and/or use of iterative reconstruction technique. COMPARISON:  05/26/2021 FINDINGS: Brain: No visible change. Brain atrophy with some frontal lobe predominance. Mild chronic small-vessel change of the white matter. No sign of acute infarction, mass lesion, hemorrhage, hydrocephalus or extra-axial collection. Vascular: There is atherosclerotic calcification of the major vessels at the base of the  brain. Skull: Negative Sinuses/Orbits: Fluid levels in the paranasal sinuses consistent with rhinosinusitis. Orbits negative. Other: None IMPRESSION: No acute intracranial CT finding. Atrophy with frontal lobe predominance. Mild chronic small-vessel change of the hemispheric white matter. Fluid levels of the paranasal sinuses consistent with rhinosinusitis. Electronically Signed   By: Nelson Chimes M.D.   On: 05/28/2021 10:58   MR BRAIN WO CONTRAST  Result Date: 05/28/2021 CLINICAL DATA:  Seizure, new-onset, no history of trauma EXAM: MRI HEAD WITHOUT CONTRAST TECHNIQUE: Multiplanar, multiecho pulse sequences of the brain and surrounding structures were obtained without intravenous contrast. COMPARISON:  CT head from the same day. FINDINGS: Motion limited study. Brain: No acute infarction, hemorrhage, hydrocephalus, extra-axial collection or mass lesion. Frontal predominant atrophy. Mild T2/FLAIR hyperintensities the white matter, nonspecific but compatible with chronic microvascular disease that is very mild for age. The hippocampi appear symmetric in size and signal. . Vascular: Major arterial flow voids are maintained at the skull base. Skull and upper cervical spine: Normal marrow signal. Sinuses/Orbits: Scattered opacified ethmoid air cells with air-fluid levels in bilateral maxillary sinuses and sphenoid sinuses. Other: Small bilateral mastoid effusions. IMPRESSION: 1. No evidence of acute abnormality on this motion limited study. 2. Frontal predominant cerebral atrophy. 3. Paranasal sinus disease with air-fluid levels, detailed above. Electronically Signed   By: Margaretha Sheffield M.D.   On: 05/28/2021 17:42   EEG adult  Result Date: 05/28/2021 Lora Havens, MD     05/28/2021  4:45 PM Patient Name: Brittany Russo MRN: [id number] Epilepsy Attending: Lora Havens Referring Physician/Provider: Dr Zeb Comfort Date: 05/28/2021 Duration: 22.16 mins Patient history: 76 year old female with multiple  medical comorbidities as noted in HPI who had a seizure-like episode at around 4 AM and another generalized tonic-clonic seizure at 0955 this morning. EEG to evaluate for seizure Level of alertness: lethargic/ asleep AEDs during EEG study: None Technical aspects: This EEG study was done with scalp electrodes positioned according to the 10-20 International  system of electrode placement. Electrical activity was acquired at a sampling rate of 500Hz  and reviewed with a high frequency filter of 70Hz  and a low frequency filter of 1Hz . EEG data were recorded continuously and digitally stored. Description: No posterior dominant rhythm was seen. Sleep was characterized by vertex waves, sleep spindles (12 to 14 Hz), maximal frontocentral region. EEG showed continuous generalized polymorphic 3 to 6 Hz theta-delta slowing. Hyperventilation and photic stimulation were not performed.   ABNORMALITY - Continuous slow, generalized IMPRESSION: This study is suggestive of moderate/severe diffuse encephalopathy, nonspecific etiology. No seizures or epileptiform discharges were seen throughout the recording. Priyanka Barbra Sarks     Scheduled Meds:  atenolol  50 mg Oral Daily   atorvastatin  80 mg Oral Q2000   buPROPion  150 mg Oral Daily   escitalopram  20 mg Oral Daily   feeding supplement  237 mL Oral BID BM   gabapentin  300 mg Oral QHS   insulin aspart  0-5 Units Subcutaneous QHS   insulin aspart  0-9 Units Subcutaneous TID WC   lactulose  10 g Oral TID   pantoprazole  40 mg Oral Daily   rifaximin  550 mg Oral BID   Continuous Infusions:  cefTRIAXone (ROCEPHIN)  IV 1 g (05/28/21 1415)   dextrose 5 % and 0.45% NaCl       LOS: 0 days    Roxan Hockey M.D on 05/28/2021 at 6:02 PM  Go to [email or web address] - for contact info  [facility] - Office  [phone]  If 7PM-7AM, please contact night-coverage [email or web address] Password [facility] 05/28/2021, 6:02 PM

## 2021-05-28 NOTE — Progress Notes (Signed)
EEG complete - results pending 

## 2021-05-28 NOTE — Progress Notes (Signed)
Tele neuro consult completed at bedside. Pt awakens with tactile stimuli, answered yes/no questions and able to follow MD commands to move arm and legs but drowsy and immediately back to sleep. PERRL, pupils 3 and round. ?

## 2021-05-28 NOTE — Progress Notes (Signed)
Pt has been sleeping since seizure earlier this am. VSS, resp even and non-labored, no further seizure activity noted. Family remains at bedside, advised to call if any needs, state understanding. ?

## 2021-05-28 NOTE — Consult Note (Signed)
I connected with  [name] on 05/28/21 by a video enabled telemedicine application and verified that I am speaking with the correct person using two identifiers.   I discussed the limitations of evaluation and management by telemedicine. The patient expressed understanding and agreed to proceed.  Location of patient: [facility] Location of physician: [facility].   Neurology Consultation Reason for Consult: Seizure Referring Physician: Dr Roxan Hockey  CC: Seizure, altered mental status  History is obtained from: Patient, daughter and husband at bedside, one daughter on phone, chart review  HPI: Brittany Russo is a 76 y.o. female with history of liver cirrhosis sec to NASH, diabetes, hypertension, type 2 diabetes, depression, anxiety, seizure, chronic pancytopenia and chronic hyponatremia, right above-knee amputation who presented on 05/26/2020 with altered mental status.  Her ammonia was elevated at 119, was sodium was 129( close to baseline), was recently diagnosed with UTI status post Macrobid 10 days ago.  Repeat urine culture showed more than 100,000 colonies per mL of gram-negative rods and therefore patient is on IV ceftriaxone.  Patient's mental status is gradually improving.  However, this morning around 4 AM patient was noted to have some twitching after which she appeared more lethargic.  At around 955, patient had another generalized tonic-clonic seizure-like episode lasting about 3 to 4 minutes.  Patient was given IM Ativan 1 mg.  Neurology was consulted for further recommendations.  Per review of chart patient had a seizure on June 07, 2014.  Daughters report that seizure happened in the setting of Xanax withdrawal and hyperglycemia with blood glucose of 500. Daughter states all of yesterday patient looked like she was looking off to the right side.  At around 4 AM in the morning she had left upper extremity elevation, clawlike movement of left hand and right  gaze preference which lasted for a few minutes.  Daughter also showed me to videos on phone.  During the procedure patient had right upper extremity elevation with call light movement of right hand followed by left arm elevation, patient was blinking.  After the second she relaxed both arms.  During the second event patient had right upper extremity elevation with fist formation.  Per daughter at around 41, patient had a generalized tonic-clonic seizure-like activity.  Patient received IV Ativan.  She is starting to wake up but is not back to baseline.  MRI brain with and without contrast 06/08/2015: No evidence of acute intracranial abnormality or acute ischemia. Fluid levels within bilateral maxillary and left sphenoid sinus may reflect acute sinusitis in the right clinical setting.   ROS: All other systems reviewed and negative except as noted in the HPI.   Past Medical History:  Diagnosis Date   Acute and subacute hepatic failure without coma    Allergy    Anasarca    Anxiety    Arthritis    Ascites 09/2018   no SBP on 850 cc tap 10/13/18   Asthma    allery induced   Cirrhosis of liver ([place]) ~ 2004   from NAFLD.  Dx ~ 2004.  Dr Dorcas Mcmurray at [facility]/     Depression    Diabetes mellitus without complication [facility])    type 2   Eczema of both hands    Endometrial cancer ([place]) 1996, 2003   hysterectomy 1996.  recurrence 2003, treated with radiation.     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 She has never used smokeless tobacco. She reports that she does not drink alcohol and does not use drugs.   Medications Prior to Admission  Medication Sig Dispense Refill Last Dose   acetaminophen (TYLENOL) 325 MG tablet Take 1-2 tablets (325-650 mg total) by mouth every 4 (four) hours as needed for mild pain.   unknown   ALPRAZolam (XANAX) 0.25 MG tablet Take 1 tablet (0.25 mg total) by mouth daily as needed for anxiety. (Patient taking differently: Take 0.25 mg by mouth 2 (two) times daily as needed for anxiety.) 10 tablet 0 05/26/2021   atenolol (TENORMIN) 25 MG tablet Take 1 tablet (25 mg total) by mouth daily. (Patient taking differently: Take 50 mg by mouth daily.) 30 tablet 1 05/26/2021 at 0930   atorvastatin (LIPITOR) 80 MG tablet Take 80 mg by mouth daily at 8 pm.   05/25/2021   buPROPion (WELLBUTRIN XL) 150 MG 24 hr tablet Take 150 mg by mouth in the morning.    05/26/2021   escitalopram (LEXAPRO) 20 MG tablet Take 20 mg by mouth in the morning.    05/26/2021   ferrous sulfate (FERROUSUL) 325 (65 FE) MG tablet Take 1 tablet (325 mg total) by mouth 2 (two) times daily with a meal. 60 tablet 3 05/26/2021   fluticasone (FLONASE) 50 MCG/ACT nasal spray Place 1 spray into both nostrils in the morning.   unknown   gabapentin (NEURONTIN) 300 MG capsule Take 300 mg by mouth at bedtime.   05/25/2021   insulin aspart (NOVOLOG FLEXPEN) 100 UNIT/ML FlexPen Inject 3-12 Units into the skin in the morning and at bedtime. Can be up to 8 units twice a day depending on blood sugar. Take mid afternoon only if bloodsugar  is over 180  Takes in evening if it is 200 or over give up to 10 units per daughter Sliding scale   05/26/2021   insulin glargine (LANTUS) 100 UNIT/ML injection 17 Units daily.   05/26/2021   lactulose (CHRONULAC) 10 GM/15ML solution Take 15 mLs (10 g total) by mouth 3 (three) times daily. 236 mL 1 Past Week   mesalamine (CANASA) 1000 MG suppository Place 1 suppository rectally at  bedtime.   unknown   ondansetron (ZOFRAN-ODT) 4 MG disintegrating tablet Take 4 mg by mouth every 8 (eight) hours.   unknown   pantoprazole (PROTONIX) 40 MG tablet Take 1 tablet (40 mg total) by mouth daily. 30 tablet 1 05/26/2021   sitaGLIPtin-metformin (JANUMET) 50-500 MG per tablet Take 1 tablet by mouth 2 (two) times daily with a meal.   05/26/2021   spironolactone (ALDACTONE) 100 MG tablet Take 0.5 tablets (50 mg total) by mouth in the morning. (Patient taking differently: Take 100 mg by mouth daily.) 30 tablet 3 05/26/2021   traZODone (DESYREL) 50 MG tablet Take 50 mg by mouth at bedtime as needed for sleep.    05/25/2021   furosemide (  LASIX) 20 MG tablet Take 1 tablet (20 mg total) by mouth daily. (Patient taking differently: Take 40 mg by mouth daily.) 30 tablet 3    Insulin Glargine (BASAGLAR KWIKPEN) 100 UNIT/ML Inject 12 Units into the skin daily. (Patient not taking: Reported on 05/26/2021)   Not Taking   Multiple Vitamin (MULTIVITAMIN WITH MINERALS) TABS tablet Take 1 tablet by mouth daily. (Patient not taking: Reported on 05/26/2021)   Not Taking      Exam: Current vital signs: BP (!) 104/46 (BP Location: Right Arm) Comment: Nurse Crystal Notified   Pulse (!) 56 Comment: Nurse Crystal Notified   Temp 98.6 F (37 C) (Oral)    Resp 16    Ht 5' 2"  (1.575 m)    Wt 50.6 kg    SpO2 98%    BMI 20.40 kg/m  Vital signs in last 24 hours: Temp:  [98.1 F (36.7 C)-98.6 F (37 C)] 98.6 F (37 C) (03/08 1221) Pulse Rate:  [56-72] 56 (03/08 1221) Resp:  [15-18] 16 (03/08 1221) BP: (102-154)/(46-74) 104/46 (03/08 1221) SpO2:  [96 %-100 %] 98 % (03/08 1221)   Physical Exam  Constitutional: Appears well-developed and well-nourished.  Eyes: No scleral injection Respiratory: Effort normal, non-labored breathing Neuro: Opens eyes to repeated tactile stimuli, able to follow simple one-step commands, PERRLA, no gaze deviation, no apparent facial asymmetry, able to move all 4 extremities antigravity  without drift  I have reviewed labs in epic and the results pertinent to this consultation are: CBC:  Recent Labs  Lab 05/26/21 1257 05/27/21 0447 05/28/21 0915  WBC 5.7 5.7 6.7  NEUTROABS 4.6  --   --   HGB 10.3* 9.9* 10.9*  HCT 32.0* 30.9* 31.6*  MCV 98.2 99.0 97.2  PLT 66* 66* 78*    Basic Metabolic Panel:  Lab Results  Component Value Date   NA 126 (L) 05/28/2021   K 4.2 05/28/2021   CO2 21 (L) 05/28/2021   GLUCOSE 148 (H) 05/28/2021   BUN 30 (H) 05/28/2021   CREATININE 1.40 (H) 05/28/2021   CALCIUM 8.4 (L) 05/28/2021   GFRNONAA 40 (L) 05/28/2021   GFRAA >60 09/08/2019   Lipid Panel: No results found for: LDLCALC HgbA1c:  Lab Results  Component Value Date   HGBA1C 5.7 (H) 04/09/2021   Urine Drug Screen:     Component Value Date/Time   LABOPIA POSITIVE (A) 10/10/2018 2143   COCAINSCRNUR NONE DETECTED 10/10/2018 2143   LABBENZ POSITIVE (A) 10/10/2018 2143   AMPHETMU NONE DETECTED 10/10/2018 2143   THCU NONE DETECTED 10/10/2018 2143   LABBARB NONE DETECTED 10/10/2018 2143    Alcohol Level No results found for: ETH   I have reviewed the images obtained:  CT head without contrast 05/28/2021: No acute abnormality. Atrophy with frontal lobe predominance. Mild chronic small-vessel change of the hemispheric white matter. Fluid levels of the paranasal sinuses consistent with rhinosinusitis.  ASSESSMENT/PLAN: 76 year old female with multiple medical comorbidities as noted in HPI who had a seizure-like episode at around 4 AM and another generalized tonic-clonic seizure at 0955 this morning.  Seizure Hyperammonemia (improving) Chronic hyponatremia Gram-negative UTI -Patient's current ammonia is 36, sodium is 129 which is close to baseline.  She has been on IV ceftriaxone for 3 days.  She is on bupropion which has been a chronic medication for years.  It also appears that patient has had one seizure in the past which could have due to benzodiazepine withdrawal and  hyperglycemia. -Patient has multiple factors which would contribute  to lowering her seizure threshold.  Her previous seizure if the current seizures were clearly provoked or not.  The focal nature of the episode does sound concerning.  Recommendations: -Routine EEG to look for potential epileptogenicity -MRI brain without contrast to look for any acute intracranial abnormality -Discussed with family that if EEG or MRI is abnormal, we would recommend starting Keppra 250 mg twice daily.  -However, if EEG and MRI are normal, and he would prefer to hold off on starting AEDs. -Recommend follow-up with neurology in 4 to 6 weeks -Recommend seizure precautions.  Of note, patient does not drive. -Management of rest of comorbidities per primary team -Discussed plan in detail with patient's family at bedside and Dr. Denton Brick  Seizure precautions: Per [facility] statutes, patients with seizures are not allowed to drive until they have been seizure-free for six months and cleared by a physician    Use caution when using heavy equipment or power tools. Avoid working on ladders or at heights. Take showers instead of baths. Ensure the water temperature is not too high on the home water heater. Do not go swimming alone. Do not lock yourself in a room alone (i.e. bathroom). When caring for infants or small children, sit down when holding, feeding, or changing them to minimize risk of injury to the child in the event you have a seizure. Maintain good sleep hygiene. Avoid alcohol.    If patient has another seizure, call 911 and bring them back to the ED if: A.  The seizure lasts longer than 5 minutes.      B.  The patient doesn't wake shortly after the seizure or has new problems such as difficulty seeing, speaking or moving following the seizure C.  The patient was injured during the seizure D.  The patient has a temperature over 102 F (39C) E.  The patient vomited during the seizure and now is having  trouble breathing    During the Seizure   - First, ensure adequate ventilation and place patients on the floor on their left side  Loosen clothing around the neck and ensure the airway is patent. If the patient is clenching the teeth, do not force the mouth open with any object as this can cause severe damage - Remove all items from the surrounding that can be hazardous. The patient may be oblivious to what's happening and may not even know what he or she is doing. If the patient is confused and wandering, either gently guide him/her away and block access to outside areas - Reassure the individual and be comforting - Call 911. In most cases, the seizure ends before EMS arrives. However, there are cases when seizures may last over 3 to 5 minutes. Or the individual may have developed breathing difficulties or severe injuries. If a pregnant patient or a person with diabetes develops a seizure, it is prudent to call an ambulance. - Finally, if the patient does not regain full consciousness, then call EMS. Most patients will remain confused for about 45 to 90 minutes after a seizure, so you must use judgment in calling for help.    After the Seizure (Postictal Stage)   After a seizure, most patients experience confusion, fatigue, muscle pain and/or a headache. Thus, one should permit the individual to sleep. For the next few days, reassurance is essential. Being calm and helping reorient the person is also of importance.   Most seizures are painless and end spontaneously. Seizures are not harmful to others  but can lead to complications such as stress on the lungs, brain and the heart. Individuals with prior lung problems may develop labored breathing and respiratory distress.   Thank you for allowing [place] to participate in the care of this patient. If you have any further questions, please contact  me or neurohospitalist.   Zeb Comfort Epile[facility] neuro[facility]ist

## 2021-05-28 NOTE — Progress Notes (Signed)
Pt has discharge order and we are waiting on EMS to arrive for transport to home. Pt's daughter is here and states that pt is not responding appropriately and has refused to eat anything except one bite of sausage this am. Pt will take sips of water when asked.  ?Pt is awake, eyes open, sometimes will acknowledge you and answer appropriately and at other times doesn't seem to even know we are in the room. Responses are delayed and require prompting. PERRLA, grips equal and moderate, face symmetrical, follows all commands. Daughter is adamant that there has been a change in her mentation from overnight. MD Courage notified of current condition and daughter's concern. ?

## 2021-05-28 NOTE — Procedures (Signed)
Patient Name: Brittany Russo  ?MRN: [id number]  ?Epilepsy Attending: Lora Havens  ?Referring Physician/Provider: Dr Zeb Comfort ?Date: 05/28/2021 ?Duration: 22.16 mins ? ?Patient history: 76 year old female with multiple medical comorbidities as noted in HPI who had a seizure-like episode at around 4 AM and another generalized tonic-clonic seizure at 0955 this morning. EEG to evaluate for seizure ? ?Level of alertness: lethargic/ asleep ? ?AEDs during EEG study: None ? ?Technical aspects: This EEG study was done with scalp electrodes positioned according to the 10-20 International system of electrode placement. Electrical activity was acquired at a sampling rate of 500Hz  and reviewed with a high frequency filter of 70Hz  and a low frequency filter of 1Hz . EEG data were recorded continuously and digitally stored.  ? ?Description: No posterior dominant rhythm was seen. Sleep was characterized by vertex waves, sleep spindles (12 to 14 Hz), maximal frontocentral region. EEG showed continuous generalized polymorphic 3 to 6 Hz theta-delta slowing. Hyperventilation and photic stimulation were not performed.    ? ?ABNORMALITY ?- Continuous slow, generalized ? ?IMPRESSION: ?This study is suggestive of moderate/severe diffuse encephalopathy, nonspecific etiology. No seizures or epileptiform discharges were seen throughout the recording. ? ?Lora Havens  ? ?

## 2021-05-29 DIAGNOSIS — D61818 Other pancytopenia: Secondary | ICD-10-CM | POA: Diagnosis present

## 2021-05-29 LAB — URINE CULTURE: Culture: >=100000 — AB

## 2021-05-29 LAB — CBC
HCT: 28.2 % — ABNORMAL LOW (ref 36.0–46.0)
Hemoglobin: 8.9 g/dL — ABNORMAL LOW (ref 12.0–15.0)
MCH: 31.9 pg (ref 26.0–34.0)
MCHC: 31.6 g/dL (ref 30.0–36.0)
MCV: 101.1 fL — ABNORMAL HIGH (ref 80.0–100.0)
Platelets: 41 10*3/uL — ABNORMAL LOW (ref 150–400)
RBC: 2.79 MIL/uL — ABNORMAL LOW (ref 3.87–5.11)
RDW: 17.2 % — ABNORMAL HIGH (ref 11.5–15.5)
WBC: 3.6 10*3/uL — ABNORMAL LOW (ref 4.0–10.5)
nRBC: 0 % (ref 0.0–0.2)

## 2021-05-29 LAB — COMPREHENSIVE METABOLIC PANEL
ALT: 54 U/L — ABNORMAL HIGH (ref 0–44)
AST: 74 U/L — ABNORMAL HIGH (ref 15–41)
Albumin: 2.1 g/dL — ABNORMAL LOW (ref 3.5–5.0)
Alkaline Phosphatase: 83 U/L (ref 38–126)
Anion gap: 8 (ref 5–15)
BUN: 28 mg/dL — ABNORMAL HIGH (ref 8–23)
CO2: 20 mmol/L — ABNORMAL LOW (ref 22–32)
Calcium: 7.9 mg/dL — ABNORMAL LOW (ref 8.9–10.3)
Chloride: 101 mmol/L (ref 98–111)
Creatinine, Ser: 1.41 mg/dL — ABNORMAL HIGH (ref 0.44–1.00)
GFR, Estimated: 39 mL/min — ABNORMAL LOW (ref >60)
Glucose, Bld: 138 mg/dL — ABNORMAL HIGH (ref 70–99)
Potassium: 4 mmol/L (ref 3.5–5.1)
Sodium: 129 mmol/L — ABNORMAL LOW (ref 135–145)
Total Bilirubin: 1 mg/dL (ref 0.3–1.2)
Total Protein: 4.9 g/dL — ABNORMAL LOW (ref 6.5–8.1)

## 2021-05-29 LAB — GLUCOSE, CAPILLARY
Glucose-Capillary: 143 mg/dL — ABNORMAL HIGH (ref 70–99)
Glucose-Capillary: 255 mg/dL — ABNORMAL HIGH (ref 70–99)

## 2021-05-29 MED ORDER — CIPROFLOXACIN HCL 250 MG PO TABS
250.0000 mg | ORAL_TABLET | Freq: Every day | ORAL | Status: DC
  Administered 2021-05-29: 11:00:00 250 mg via ORAL
  Filled 2021-05-29: qty 1

## 2021-05-29 MED ORDER — FOSFOMYCIN TROMETHAMINE 3 G PO PACK
3.0000 g | PACK | Freq: Once | ORAL | Status: AC
  Administered 2021-05-29: 12:00:00 3 g via ORAL
  Filled 2021-05-29 (×2): qty 3

## 2021-05-29 MED ORDER — GABAPENTIN 300 MG PO CAPS: 300.0000 mg | ORAL_CAPSULE | Freq: Every day | ORAL | 3 refills | Status: AC

## 2021-05-29 MED ORDER — CIPROFLOXACIN HCL 250 MG PO TABS: 250.0000 mg | ORAL_TABLET | Freq: Two times a day (BID) | ORAL | 0 refills | Status: AC

## 2021-05-29 MED ORDER — ALPRAZOLAM 0.25 MG PO TABS: 0.2500 mg | ORAL_TABLET | Freq: Two times a day (BID) | ORAL | 0 refills | Status: AC

## 2021-05-29 NOTE — Discharge Summary (Signed)
KATIA HANNEN, is a 76 y.o. female  DOB September 07, 1945  MRN [id number].  Admission date:  05/26/2021  Admitting Physician  Varnell Donate Denton Brick, MD  Discharge Date:  05/29/2021   Primary MD  Rosalee Kaufman, PA-C  Recommendations for primary care physician for things to follow:   1)Avoid ibuprofen/Advil/Aleve/Motrin/Goody Powders/Naproxen/BC powders/Meloxicam/Diclofenac/Indomethacin and other Nonsteroidal anti-inflammatory medications as these will make you more likely to bleed and can cause stomach ulcers, can also cause Kidney problems.   2) change gabapentin/Neurontin to 300 mg every evening  3) continue Xanax 0.25 mg twice daily  4)Please take Cipro antibiotic as prescribed for urinary tract infection  5)follow up with Neurologist  Dr Ellouise Newer in 3 to 4 weeks for recheck due to seizure concerns --- --Address [address] [place], [place], [place]: [phone]  6) your neurologist may discuss the possibility of reducing your dose of Wellbutrin/bupropion or tapering off Wellbutrin/bupropion altogether over time in order to reduce your risk for seizures in the future  7) repeat CBC and BMP blood test--- blood work can be drawn by a visiting home nurse in about a week or so  Admission Diagnosis  Hepatic encephalopathy [K76.82] Confusion [R41.0] Seizure ([place]) [R56.9]   Discharge Diagnosis  Hepatic encephalopathy [K76.82] Confusion [R41.0] Seizure ([place]) [R56.9]    Principal Problem:   Acute hepatic encephalopathy Active Problems:   Seizure --Xanax withdrawal may have contributed   Liver Cirrhosis    Acute kidney injury superimposed on CKD 3B   Enterococcus and Hafnia UTI    Controlled type 2 diabetes mellitus with hyperglycemia, with long-term current use of insulin ([place])   Pancytopenia due to Liver Cirrhosis   Amputation above knee ([place])    Hyponatremia   Thrombocytopenia ([place])   Hypertension   Hyperlipidemia   Gastroesophageal reflux disease      Past Medical History:  Diagnosis Date   Acute and subacute hepatic failure without coma    Allergy    Anasarca    Anxiety    Arthritis    Ascites 09/2018   no SBP on 850 cc tap 10/13/18   Asthma    allery induced   Cirrhosis of liver ([place]) ~ 2004   from NAFLD.  Dx ~ 2004.  Dr Dorcas Mcmurray at [facility]/     Depression    Diabetes mellitus without complication [facility])    type 2   Eczema of both hands    Endometrial cancer ([place]) 1996, 2003   hysterectomy 1996.  recurrence 2003, treated with radiation.     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, DR Allyson Sabal Mir at [facility] in [place]   Hyperlipidemia    Hypertension    Muscle weakness    Seizures ([place])    last seizure June 07, 2014    Thrombocytopenia [facility])    Thyroid nodule     Past Surgical History:  Procedure Laterality Date   AMPUTATION Right 06/29/2019  Procedure: AMPUTATION ABOVE KNEE through knee;  Surgeon: Paralee Cancel, MD;  Location: [facility];  Service: Orthopedics;  Laterality: Right;  2 hrs   CATARACT EXTRACTION W/PHACO Left 06/04/2017   Procedure: CATARACT EXTRACTION PHACO AND INTRAOCULAR LENS PLACEMENT (IOC);  Surgeon: Baruch Goldmann, MD;  Location: [facility];  Service: Ophthalmology;  Laterality: Left;  CDE: 3.90   CATARACT EXTRACTION W/PHACO Right 07/15/2017   Procedure: CATARACT EXTRACTION PHACO AND INTRAOCULAR LENS PLACEMENT (IOC);  Surgeon: Baruch Goldmann, MD;  Location: [facility];  Service: Ophthalmology;  Laterality: Right;  CDE: 7.60   COLONOSCOPY  06/2007   in [place]. for hematochzia.  radiation proctitis   DILATION AND CURETTAGE OF UTERUS     ESOPHAGOGASTRODUODENOSCOPY  2016   ESOPHAGOGASTRODUODENOSCOPY (EGD) WITH PROPOFOL N/A 12/30/2018   Procedure: EGD;  Surgeon: Clarene Essex, MD;  [facility]: [facility];  Service: Endoscopy;  Laterality:  N/A;   EXCISIONAL TOTAL KNEE ARTHROPLASTY Right 01/03/2019   Procedure: EXCISIONAL TOTAL KNEE ARTHROPLASTY;  Surgeon: Paralee Cancel, MD;  [facility]: [facility];  Service: Orthopedics;  Laterality: Right;   FLEXIBLE SIGMOIDOSCOPY N/A 12/30/2018   Procedure: FLEXIBLE SIGMOIDOSCOPY;  Surgeon: Clarene Essex, MD;  [facility];  Service: Endoscopy;  Laterality: N/A;   IR PARACENTESIS  10/13/2018   IR PARACENTESIS  07/21/2019   IR THORACENTESIS ASP PLEURAL SPACE W/IMG GUIDE  07/19/2019   STERIOD INJECTION Left 06/29/2019   Procedure: LEFT KNEE INJECTION;  Surgeon: Paralee Cancel, MD;  [facility];  Service: Orthopedics;  Laterality: Left;   TOTAL ABDOMINAL HYSTERECTOMY  1996   TOTAL KNEE ARTHROPLASTY Right 09/22/2018   Procedure: TOTAL KNEE ARTHROPLASTY;  Surgeon: Paralee Cancel, MD;  [facility];  Service: Orthopedics;  Laterality: Right;  70 mins   TOTAL KNEE REVISION Right 09/27/2018   Procedure: TOTAL KNEE REVISION;  Surgeon: Paralee Cancel, MD;  [facility];  Service: Orthopedics;  Laterality: Right;   TOTAL KNEE REVISION Right 11/22/2018   Procedure: repair right knee extensor mechanism;  Surgeon: Paralee Cancel, MD;  Location[facility];  Service: Orthopedics;  Laterality: Right;  90 mins     HPI  from the history and physical done on the day of admission:   Patient coming from: Home   Chief Complaint: Confusion   HPI: ZYARA RILING is a 76 y.o. female with medical history significant for  Liver Cirrhosis, DM, Depression, HTN, Seizures.  Patient was brought to the ED from home with reports of change in mental status.  At time of my evaluation patient was status post lactulose and a bowel movement and per spouse patient has improved.  History is obtained from patient and husband at bedside. Patient's husband reports confusion and visual hallucinations started yesterday evening.  He also reports increased sleepiness.  Patient has been compliant with lactulose.  Having 3-4 bowel movements  every day.  Yesterday she had up to 7 bowel movements.  Spouse reports 2 weeks ago for about- 3 days patient despite patient's compliance with her lactulose, she did not have any bowel movements.  Mental status remained intact and subsequently started having bowel movements.  Patient was also diagnosed with UTI, and completed a 10-day course of Macrobid 10 days ago.  Today she denies dysuria or nocturia.  No fevers no chills.  No vomiting.  .   ED Course: Temperature 98.  Heart rate 50s.  Respiratory rate 14-21.  Blood pressure systolic 211- 941.  Ammonia elevated at 119.  Sodium 129.  Platelets 66.  UA with large leukocytes  and few bacteria.  ED consults acute intracranial abnormality, frontal lobe predominant and cerebral atrophy. Oral lactulose given. 566ms n/s bolus given.  2 g IV ceftriaxone given.   Review of Systems: As per HPI all other systems reviewed and negative.     Hospital Course:    Assessment and Plan: Problem  Seizure --Xanax withdrawal may have contributed  Acute Hepatic Encephalopathy  Pancytopenia due to Liver Cirrhosis  Controlled Type 2 Diabetes Mellitus With Hyperglycemia, With Long-Term Current Use of Insulin ([facility])  Enterococcus and Hafnia UTI   Acute kidney injury superimposed on CKD 3B  Liver Cirrhosis   Hyponatremia  Thrombocytopenia ([facility])  Amputation Above Knee ([facility])    1) seizure episode -Patient was noticed to be having tonic-clonic seizures on 05/28/2021 -Family reports prior history of seizures about 7 years ago related to benzodiazepine withdrawal at the time -Neurology consult appreciated -EEG nonrevealing -CT head and brain MRI without new acute findings -It appears the patient was taking Xanax 0.25 mg twice daily at home PTA but has not been getting xanax in the hospital for at least 3 days prior to episode of seizures -Discussed with neurologist will recommend continuing Xanax 0.25 mg twice daily going forward just like she did PTA, avoid abrupt  discontinuation of benzos -Also PTA patient was taking gabapentin 300 mg as needed usually about twice a week, okay to schedule gabapentin 300 mg every afternoon -Per neurologist for now [place] has [place] episode -Consider tapering off Wellbutrin -Outpatient follow-up with epileptologist Dr. KEllouise Neweradvised   2)Acute hepatic encephalopathy --ammonia levels continue to improve -Mentation had improved   -Lactulose and rifaximin as ordered   3)UTI (urinary tract infection) -Recent UTI.  Completed 10-day course of antibiotics with Macrobid 2/25.  -Urine culture from 05/26/2021 with Enterococcus and Hafnia  -Initially treated with Rocephin  -Changed to Cipro 250 twice daily for 3 days for Hafnia, give fosfomycin x1 dose for Enterococcus    4)Acute kidney injury superimposed on CKD 3B (HCC) -Creatinine 1.8, baseline 0.7-1.2.   -In the setting of dehydration, continue use of nephrotoxic agents and UTI --Renal function improved with hydration -Repeat BMP within a week advised -Avoid NSAIDs  5) pancytopenia--- due to underlying liver cirrhosis -No bleeding concerns -Repeat CBC in about a week advised   6)Gastroesophageal reflux disease -Continue PPI.    7)Hyponatremia  -- Chronic but stable overall -Avoid excessive free water -Repeat BMP in a week   Controlled type 2 diabetes mellitus -with long-term current use of insulin (HCC) -A1c 5.7 reflecting excellent diabetic control PTA- --Continue PTA  diabetic regimen -Continue follow-up with PCP to further adjust treatment.   Cirrhosis ([place] - Continue patient follow-up with GI service. -Continue current dose of Lasix/spironolactone; continue atenolol, rifaximin and lactulose. -Acute hepatic encephalopathy has resolved, ammonia improved significantly   Hyperlipidemia - Continue statin.   Hypertension  --Continue atenolol, follow-up with PCP for medication adjustment - Generalized weakness  and deconditioning--patient with right AKA, generalized weakness and deconditioning benefit from home health PT   Disposition--Home with home health services  Disposition: The patient is from: Home              Anticipated d/c is to: Home                Code Status :  -  Code Status: Full Code    Family Communication:     (patient is alert, awake and coherent)   Discussed with patient's daughter  and husband at bedside  Discharge Condition: stable Follow UP   Follow-up Information     Rosalee Kaufman, PA-C. Schedule an appointment as soon as possible for a visit in 10 day(s).   Specialty: Physician Assistant Contact information: [place] [address] [phone]         Arnoldo Lenis, MD .   Specialty: Cardiology Contact information: [place] [address] [place] Follow up.   Specialty: Home Health Services Why: RN will call to schedule your next home visit.        Cameron Sprang, MD. Schedule an appointment as soon as possible for a visit in 3 week(s).   Specialty: Neurology Contact information: [place] [address] [phone]                  Consults obtained -Neurology  Diet and Activity recommendation:  As advised  Discharge Instructions    Discharge Instructions     Call MD for:  difficulty breathing, headache or visual disturbances   Complete by: As directed    Call MD for:  persistant dizziness or light-headedness   Complete by: As directed    Call MD for:  persistant nausea and vomiting   Complete by: As directed    Call MD for:  temperature >100.4   Complete by: As directed    Diet general   Complete by: As directed    Discharge instructions   Complete by: As directed    -Watch the amount of sodium in your diet -Maintain adequate hydration -Take medications as prescribed -Arrange follow-up with PCP in  10 days -Follow-up with gastroenterology service as previously instructed   Discharge instructions   Complete by: As directed    1)Avoid ibuprofen/Advil/Aleve/Motrin/Goody Powders/Naproxen/BC powders/Meloxicam/Diclofenac/Indomethacin and other Nonsteroidal anti-inflammatory medications as these will make you more likely to bleed and can cause stomach ulcers, can also cause Kidney problems.   2) change gabapentin/Neurontin to 300 mg every evening  3) continue Xanax 0.25 mg twice daily  4)Please take Cipro antibiotic as prescribed for urinary tract infection  5)follow up with Neurologist  Dr Ellouise Newer in 3 to 4 weeks for recheck due to seizure concerns --- --Address [place] [place], [place], [place]: [phone]  6) your neurologist may discuss the possibility of reducing your dose of Wellbutrin/bupropion or tapering off Wellbutrin/bupropion altogether over time in order to reduce your risk for seizures in the future  7) repeat CBC and BMP blood test--- blood work can be drawn by a visiting home nurse in about a week or so   Increase activity slowly   Complete by: As directed          Discharge Medications     Allergies as of 05/29/2021       Reactions   Asa [aspirin] Other (See Comments)   Pt not allergic but cannot take due to bleeding    Other Other (See Comments)   Any jewelry metal-hives and itching         Medication List     TAKE these medications    acetaminophen 325 MG tablet Commonly known as: TYLENOL Take 1-2 tablets (325-650 mg total) by mouth every 4 (four) hours as needed for mild pain.   ALPRAZolam 0.25 MG tablet Commonly known as: XANAX Take 1 tablet (0.25 mg total)  by mouth 2 (two) times daily. What changed:  when to take this reasons to take this   atenolol 25 MG tablet Commonly known as: TENORMIN Take 1 tablet (25 mg total) by mouth daily. What changed: how much to take   atorvastatin 80 MG  tablet Commonly known as: LIPITOR Take 80 mg by mouth daily at 8 pm.   buPROPion 150 MG 24 hr tablet Commonly known as: WELLBUTRIN XL Take 150 mg by mouth in the morning.   ciprofloxacin 250 MG tablet Commonly known as: CIPRO Take 1 tablet (250 mg total) by mouth 2 (two) times daily for 3 days.   escitalopram 20 MG tablet Commonly known as: LEXAPRO Take 20 mg by mouth in the morning.   ferrous sulfate 325 (65 FE) MG tablet Commonly known as: FerrouSul Take 1 tablet (325 mg total) by mouth 2 (two) times daily with a meal.   fluticasone 50 MCG/ACT nasal spray Commonly known as: FLONASE Place 1 spray into both nostrils in the morning.   furosemide 20 MG tablet Commonly known as: LASIX Take 1 tablet (20 mg total) by mouth daily. What changed: how much to take   gabapentin 300 MG capsule Commonly known as: NEURONTIN Take 1 capsule (300 mg total) by mouth at bedtime.   insulin glargine 100 UNIT/ML injection Commonly known as: LANTUS 17 Units daily. What changed: Another medication with the same name was removed. Continue taking this medication, and follow the directions you see here.   lactulose 10 GM/15ML solution Commonly known as: CHRONULAC Take 15 mLs (10 g total) by mouth 3 (three) times daily.   mesalamine 1000 MG suppository Commonly known as: CANASA Place 1 suppository rectally at bedtime.   multivitamin with minerals Tabs tablet Take 1 tablet by mouth daily.   NovoLOG FlexPen 100 UNIT/ML FlexPen Generic drug: insulin aspart Inject 3-12 Units into the skin in the morning and at bedtime. Can be up to 8 units twice a day depending on blood sugar. Take mid afternoon only if bloodsugar  is over 180  Takes in evening if it is 200 or over give up to 10 units per daughter Sliding scale   ondansetron 4 MG disintegrating tablet Commonly known as: ZOFRAN-ODT Take 4 mg by mouth every 8 (eight) hours.   pantoprazole 40 MG tablet Commonly known as: PROTONIX Take 1  tablet (40 mg total) by mouth daily.   rifaximin 550 MG Tabs tablet Commonly known as: XIFAXAN Take 1 tablet (550 mg total) by mouth 2 (two) times daily.   sitaGLIPtin-metformin 50-500 MG tablet Commonly known as: JANUMET Take 1 tablet by mouth 2 (two) times daily with a meal.   spironolactone 100 MG tablet Commonly known as: ALDACTONE Take 0.5 tablets (50 mg total) by mouth in the morning. What changed:  how much to take when to take this   traZODone 50 MG tablet Commonly known as: DESYREL Take 50 mg by mouth at bedtime as needed for sleep.        Major procedures and Radiology Reports - PLEASE review detailed and final reports for all details, in brief -   DG Chest 2 View  Result Date: 05/06/2021 CLINICAL DATA:  76 year old female with a history of alcoholic cirrhosis EXAM: CHEST - 2 VIEW COMPARISON:  04/21/2021 FINDINGS: Cardiomediastinal silhouette likely unchanged, with the right heart partially obscured by overlying lung/pleural disease. Opacity at the right lung base obscuring the right hemidiaphragm and the right heart border, with hazy opacity extending to the base. No  pneumothorax. No left-sided pleural effusion. No interlobular septal thickening. No acute displaced fracture IMPRESSION: Recurrent opacity at the right lung base, compatible with combination of pleural effusion and associated atelectasis/consolidation Electronically Signed   By: Corrie Mckusick D.O.   On: 05/06/2021 14:22   CT HEAD WO CONTRAST (5MM)  Result Date: 05/28/2021 CLINICAL DATA:  Seizure, new onset, no history of trauma. Mental status change. EXAM: CT HEAD WITHOUT CONTRAST TECHNIQUE: Contiguous axial images were obtained from the base of the skull through the vertex without intravenous contrast. RADIATION DOSE REDUCTION: This exam was performed according to the departmental dose-optimization program which includes automated exposure control, adjustment of the mA and/or kV according to patient size  and/or use of iterative reconstruction technique. COMPARISON:  05/26/2021 FINDINGS: Brain: No visible change. Brain atrophy with some frontal lobe predominance. Mild chronic small-vessel change of the white matter. No sign of acute infarction, mass lesion, hemorrhage, hydrocephalus or extra-axial collection. Vascular: There is atherosclerotic calcification of the major vessels at the base of the brain. Skull: Negative Sinuses/Orbits: Fluid levels in the paranasal sinuses consistent with rhinosinusitis. Orbits negative. Other: None IMPRESSION: No acute intracranial CT finding. Atrophy with frontal lobe predominance. Mild chronic small-vessel change of the hemispheric white matter. Fluid levels of the paranasal sinuses consistent with rhinosinusitis. Electronically Signed   By: Nelson Chimes M.D.   On: 05/28/2021 10:58   CT Head Wo Contrast  Result Date: 05/26/2021 CLINICAL DATA:  Memory loss. EXAM: CT HEAD WITHOUT CONTRAST TECHNIQUE: Contiguous axial images were obtained from the base of the skull through the vertex without intravenous contrast. RADIATION DOSE REDUCTION: This exam was performed according to the departmental dose-optimization program which includes automated exposure control, adjustment of the mA and/or kV according to patient size and/or use of iterative reconstruction technique. COMPARISON:  Prior head CT examinations 10/16/2020 and earlier. FINDINGS: Brain: Frontal lobe predominant cerebral atrophy. Mild ill-defined hypoattenuation within the cerebral white matter, nonspecific but compatible chronic small vessel ischemic disease. There is no acute intracranial hemorrhage. No demarcated cortical infarct. No extra-axial fluid collection. No evidence of an intracranial mass. No midline shift. Vascular: No hyperdense vessel.  Atherosclerotic calcifications. Skull: Normal. Negative for fracture or focal lesion. Sinuses/Orbits: Visualized orbits show no acute finding. Moderate scattered fluid within  the right ethmoid air cells. Trace mucosal thickening within the left ethmoid air cells. Large fluid level within the right sphenoid sinus. Frothy secretions and moderate fluid level within the left sphenoid sinus. Small-to-moderate fluid levels within the maxillary sinuses at the imaged levels. IMPRESSION: No evidence of acute intracranial abnormality. Frontal lobe predominant cerebral atrophy. Mild chronic small vessel ischemic changes within the cerebral white matter. Paranasal sinus disease at the imaged levels, as described. Correlate for acute sinusitis. Electronically Signed   By: Kellie Simmering D.O.   On: 05/26/2021 16:20   MR BRAIN WO CONTRAST  Result Date: 05/28/2021 CLINICAL DATA:  Seizure, new-onset, no history of trauma EXAM: MRI HEAD WITHOUT CONTRAST TECHNIQUE: Multiplanar, multiecho pulse sequences of the brain and surrounding structures were obtained without intravenous contrast. COMPARISON:  CT head from the same day. FINDINGS: Motion limited study. Brain: No acute infarction, hemorrhage, hydrocephalus, extra-axial collection or mass lesion. Frontal predominant atrophy. Mild T2/FLAIR hyperintensities the white matter, nonspecific but compatible with chronic microvascular disease that is very mild for age. The hippocampi appear symmetric in size and signal. . Vascular: Major arterial flow voids are maintained at the skull base. Skull and upper cervical spine: Normal marrow signal. Sinuses/Orbits: Scattered opacified ethmoid  air cells with air-fluid levels in bilateral maxillary sinuses and sphenoid sinuses. Other: Small bilateral mastoid effusions. IMPRESSION: 1. No evidence of acute abnormality on this motion limited study. 2. Frontal predominant cerebral atrophy. 3. Paranasal sinus disease with air-fluid levels, detailed above. Electronically Signed   By: Margaretha Sheffield M.D.   On: 05/28/2021 17:42   EEG adult  Result Date: 05/28/2021 Lora Havens, MD     05/28/2021  4:45 PM Patient Name:  QUINESHA SELINGER MRN: [id number] Epilepsy Attending: Lora Havens Referring Physician/Provider: Dr Zeb Comfort Date: 05/28/2021 Duration: 22.16 mins Patient history: 76 year old female with multiple medical comorbidities as noted in HPI who had a seizure-like episode at around 4 AM and another generalized tonic-clonic seizure at 0955 this morning. EEG to evaluate for seizure Level of alertness: lethargic/ asleep AEDs during EEG study: None Technical aspects: This EEG study was done with scalp electrodes positioned according to the 10-20 International system of electrode placement. Electrical activity was acquired at a sampling rate of 500Hz  and reviewed with a high frequency filter of 70Hz  and a low frequency filter of 1Hz . EEG data were recorded continuously and digitally stored. Description: No posterior dominant rhythm was seen. Sleep was characterized by vertex waves, sleep spindles (12 to 14 Hz), maximal frontocentral region. EEG showed continuous generalized polymorphic 3 to 6 Hz theta-delta slowing. Hyperventilation and photic stimulation were not performed.   ABNORMALITY - Continuous slow, generalized IMPRESSION: This study is suggestive of moderate/severe diffuse encephalopathy, nonspecific etiology. No seizures or epileptiform discharges were seen throughout the recording. Priyanka Barbra Sarks   US Abdomen Limited RUQ (LIVER/GB)  Result Date: 05/06/2021 CLINICAL DATA:  Alcoholic cirrhosis, unspecified whether ascites present ([place]) EXAM: ULTRASOUND ABDOMEN LIMITED RIGHT UPPER QUADRANT COMPARISON:  CT abdomen pelvis 01/08/2020 FINDINGS: Gallbladder: Non-mobile echogenic foci along the gallbladder wall measuring up to 3 mm. No gallbladder wall thickening visualized. No sonographic Murphy sign noted by sonographer. Common bile duct: Diameter: 5 mm. Liver: Nodular hepatic contour. No focal lesion identified. Coarsened heterogeneous parenchymal echogenicity. Portal vein is patent on color Doppler imaging with  normal direction of blood flow towards the liver. Other: Small right pleural effusion. Trace to small volume free fluid within the abdomen. IMPRESSION: 1. Cirrhosis. No focal liver lesions identified by ultrasound. Please note that liver protocol enhanced MR and CT are the most sensitive tests for the screening detection of hepatocellular carcinoma in the high risk setting of cirrhosis. 2. Small right pleural effusion. 3. Trace to small volume simple ascites. 4. A 3 mm gallbladder polyp. Follow-up right upper quadrant ultrasound in 6 months to evaluate for stability. Electronically Signed   By: Iven Finn M.D.   On: 05/06/2021 23:18    Micro Results  Recent Results (from the past 240 hour(s))  Urine Culture     Status: Abnormal   Collection Time: 05/26/21 12:57 PM   Specimen: Urine, Clean Catch  Result Value Ref Range Status   Specimen Description   Final    URINE, CLEAN CATCH Performed at [facility], [address] [place], [place] [address]    Special Requests   Final    NONE Performed at [facility], [address], [place], [place] [address]    Culture (A)  Final    >=100,000 COLONIES/mL HAFNIA ALVEI >=100,000 COLONIES/mL ENTEROCOCCUS FAECALIS    Report Status 05/29/2021 FINAL  Final   Organism ID, Bacteria HAFNIA ALVEI (A)  Final   Organism ID, Bacteria ENTEROCOCCUS FAECALIS (A)  Final  Susceptibility   Enterococcus faecalis - MIC*    AMPICILLIN <=2 SENSITIVE Sensitive     NITROFURANTOIN <=16 SENSITIVE Sensitive     VANCOMYCIN 1 SENSITIVE Sensitive     * >=100,000 COLONIES/mL ENTEROCOCCUS FAECALIS   Hafnia alvei - MIC*    AMPICILLIN >=32 RESISTANT Resistant     CEFAZOLIN >=64 RESISTANT Resistant     CEFTRIAXONE >=64 RESISTANT Resistant     CIPROFLOXACIN <=0.25 SENSITIVE Sensitive     GENTAMICIN <=1 SENSITIVE Sensitive     IMIPENEM <=0.25 SENSITIVE Sensitive     NITROFURANTOIN <=16 SENSITIVE Sensitive     TRIMETH/SULFA <=20 SENSITIVE Sensitive      AMPICILLIN/SULBACTAM >=32 RESISTANT Resistant     PIP/TAZO >=128 RESISTANT Resistant     * >=100,000 COLONIES/mL HAFNIA ALVEI  Resp Panel by RT-PCR (Flu A&B, Covid) Nasopharyngeal Swab     Status: None   Collection Time: 05/26/21  3:52 PM   Specimen: Nasopharyngeal Swab; Nasopharyngeal(NP) swabs in vial transport medium  Result Value Ref Range Status   SARS Coronavirus 2 by RT PCR NEGATIVE NEGATIVE Final    Comment: (NOTE) SARS-CoV-2 target nucleic acids are NOT DETECTED.  The SARS-CoV-2 RNA is generally detectable in upper respiratory specimens during the acute phase of infection. The lowest concentration of SARS-CoV-2 viral copies this assay can detect is 138 copies/mL. A negative result does not preclude SARS-Cov-2 infection and should not be used as the sole basis for treatment or other patient management decisions. A negative result may occur with  improper specimen collection/handling, submission of specimen other than nasopharyngeal swab, presence of viral mutation(s) within the areas targeted by this assay, and inadequate number of viral copies(<138 copies/mL). A negative result must be combined with clinical observations, patient history, and epidemiological information. The expected result is Negative.  Fact Sheet for Patients:  EntrepreneurPulse.com.au  Fact Sheet for Healthcare Providers:  IncredibleEmployment.be  This test is no t yet approved or cleared by the [place] FDA and  has been authorized for detection and/or diagnosis of SARS-CoV-2 by FDA under an Emergency Use Authorization (EUA). This EUA will remain  in effect (meaning this test can be used) for the duration of the COVID-19 declaration under Section 564(b)(1) of the Act, 21 U.S.C.section 360bbb-3(b)(1), unless the authorization is terminated  or revoked sooner.       Influenza A by PCR NEGATIVE NEGATIVE Final   Influenza B by PCR NEGATIVE NEGATIVE Final     Comment: (NOTE) The Xpert Xpress SARS-CoV-2/FLU/RSV plus assay is intended as an aid in the diagnosis of influenza from Nasopharyngeal swab specimens and should not be used as a sole basis for treatment. Nasal washings and aspirates are unacceptable for Xpert Xpress SARS-CoV-2/FLU/RSV testing.  Fact Sheet for Patients: EntrepreneurPulse.com.au  Fact Sheet for Healthcare Providers: IncredibleEmployment.be  This test is not yet approved or cleared by the [place] FDA and has been authorized for detection and/or diagnosis of SARS-CoV-2 by FDA under an Emergency Use Authorization (EUA). This EUA will remain in effect (meaning this test can be used) for the duration of the COVID-19 declaration under Section 564(b)(1) of the Act, 21 U.S.C. section 360bbb-3(b)(1), unless the authorization is terminated or revoked.  Performed at [facility], [address], [place], [place] [address]     Today   Subjective   Ciana Simmon today has no new complaints -No further seizure concerns -Eating and drinking okay -Husband and daughter at bedside, questions answered          Patient has  been seen and examined prior to discharge   Objective   Blood pressure (!) 95/49, pulse 62, temperature 97.9 F (36.6 C), resp. rate 19, height 5' 2"  (1.575 m), weight 50.6 kg, SpO2 100 %.   Intake/Output Summary (Last 24 hours) at 05/29/2021 1110 Last data filed at 05/29/2021 0900 Gross per 24 hour  Intake 813.69 ml  Output 425 ml  Net 388.69 ml    Exam Gen:- Awake Alert, no acute distress  HEENT:- Branch.AT, No sclera icterus Neck-Supple Neck,No JVD,.  Lungs-  CTAB , good air movement bilaterally CV- S1, S2 normal, regular, 2/6 SM Abd-  +ve B.Sounds, Abd Soft, No tenderness,    Extremity/Skin:- No  edema,   good pulses Psych-affect is appropriate, oriented x3 Neuro-generalized weakness, no new focal deficits, no tremors  MSK-right AKA   Data Review   CBC w  Diff:  Lab Results  Component Value Date   WBC 3.6 (L) 05/29/2021   HGB 8.9 (L) 05/29/2021   HCT 28.2 (L) 05/29/2021   HCT 23.7 (L) 10/13/2018   PLT 41 (L) 05/29/2021   LYMPHOPCT 8 05/26/2021   BANDSPCT 0 10/10/2018   MONOPCT 8 05/26/2021   EOSPCT 1 05/26/2021   BASOPCT 1 05/26/2021    CMP:  Lab Results  Component Value Date   NA 129 (L) 05/29/2021   NA 130 (A) 06/10/2015   K 4.0 05/29/2021   CL 101 05/29/2021   CO2 20 (L) 05/29/2021   BUN 28 (H) 05/29/2021   BUN 14 06/10/2015   CREATININE 1.41 (H) 05/29/2021   PROT 4.9 (L) 05/29/2021   ALBUMIN 2.1 (L) 05/29/2021   BILITOT 1.0 05/29/2021   ALKPHOS 83 05/29/2021   AST 74 (H) 05/29/2021   ALT 54 (H) 05/29/2021  .  Total Discharge time is about 33 minutes  Roxan Hockey M.D on 05/29/2021 at 11:10 AM  Go to [email or web address] -  for contact info  [facility] - Office  [phone]

## 2021-05-29 NOTE — TOC Transition Note (Signed)
Transition of Care (TOC) - CM/SW Discharge Note ? ? ?Patient Details  ?Name: Brittany Russo ?MRN: [id number] ?Date of Birth: [birth year] ? ?Transition of Care (TOC) CM/SW Contact:  ?Iona Beard, LCSWA ?Phone Number: ?05/29/2021, 9:59 AM ? ? ?Clinical Narrative:    ?CSW updated that pt will be discharging home today. CSW updated Vaughan Basta with [facility] of pts d/c. CSW requested that MD place [facility] RN orders. TOC signing off.  ? ?Final next level of care: [place] ?Barriers to Discharge: Barriers Resolved ? ? ?Patient Goals and CMS Choice ?Patient states their goals for this hospitalization and ongoing recovery are:: Home with HH ?CMS Medicare.gov Compare Post Acute Care list provided to:: Patient ?Choice offered to / list presented to : Patient ? ?Discharge Placement ?  ?           ?  ?  ?  ?  ? ?Discharge Plan and Services ?  ?  ?           ?  ?  ?  ?  ?  ?HH Arranged: RN ?[place] Agency: [facility] ([place]) ?Date HH Agency Contacted: 05/29/21 ?  ?  ? ?Social Determinants of Health (SDOH) Interventions ?  ? ? ?Readmission Risk Interventions ?No flowsheet data found. ? ? ? ? ?

## 2021-05-29 NOTE — Progress Notes (Addendum)
Pt bladder scanned and 160m noted. Pt has urine in purewick system measuring at 717mat this time. No tenderness nor pain when palpating bladder region per pt. MD notified   ?

## 2021-05-29 NOTE — Progress Notes (Signed)
Patient has had no seizure activity this shift.  Patient has rested well, and been appropriate.  Patient has had iv fluids going through shift, but has not had a large amount of urinary output.  Patient does not c/o bladder pain or distention.  ?

## 2021-05-29 NOTE — Discharge Instructions (Signed)
1)Avoid ibuprofen/Advil/Aleve/Motrin/Goody Powders/Naproxen/BC powders/Meloxicam/Diclofenac/Indomethacin and other Nonsteroidal anti-inflammatory medications as these will make you more likely to bleed and can cause stomach ulcers, can also cause Kidney problems.  ? ?2) change gabapentin/Neurontin to 300 mg every evening ? ?3) continue Xanax 0.25 mg twice daily ? ?4)Please take Cipro antibiotic as prescribed for urinary tract infection ? ?5)follow up with Neurologist  Dr Ellouise Newer in 3 to 4 weeks for recheck due to seizure concerns --- ?--Address [address] ?[place], [place], [place]: [phone] ? ?6) your neurologist may discuss the possibility of reducing your dose of Wellbutrin/bupropion or tapering off Wellbutrin/bupropion altogether over time in order to reduce your risk for seizures in the future ? ?7) repeat CBC and BMP blood test--- blood work can be drawn by a visiting home nurse in about a week or so ?

## 2021-05-30 ENCOUNTER — Encounter: Payer: Self-pay | Admitting: Neurology

## 2021-05-30 DIAGNOSIS — K7682 Hepatic encephalopathy: Secondary | ICD-10-CM | POA: Diagnosis not present

## 2021-05-30 DIAGNOSIS — N179 Acute kidney failure, unspecified: Secondary | ICD-10-CM | POA: Diagnosis not present

## 2021-05-30 DIAGNOSIS — K7581 Nonalcoholic steatohepatitis (NASH): Secondary | ICD-10-CM | POA: Diagnosis not present

## 2021-05-30 DIAGNOSIS — E1165 Type 2 diabetes mellitus with hyperglycemia: Secondary | ICD-10-CM | POA: Diagnosis not present

## 2021-05-30 DIAGNOSIS — K7469 Other cirrhosis of liver: Secondary | ICD-10-CM | POA: Diagnosis not present

## 2021-05-30 DIAGNOSIS — R188 Other ascites: Secondary | ICD-10-CM | POA: Diagnosis not present

## 2021-06-04 ENCOUNTER — Inpatient Hospital Stay (HOSPITAL_COMMUNITY)
Admission: EM | Admit: 2021-06-04 | Discharge: 2021-06-10 | DRG: 689 | Disposition: A | Discharge status: Hospice | Payer: Medicare Other | Attending: Internal Medicine | Admitting: Internal Medicine

## 2021-06-04 ENCOUNTER — Other Ambulatory Visit: Payer: Self-pay

## 2021-06-04 ENCOUNTER — Encounter (HOSPITAL_COMMUNITY): Payer: Self-pay | Admitting: Emergency Medicine

## 2021-06-04 DIAGNOSIS — N3289 Other specified disorders of bladder: Secondary | ICD-10-CM | POA: Diagnosis not present

## 2021-06-04 DIAGNOSIS — I959 Hypotension, unspecified: Secondary | ICD-10-CM | POA: Diagnosis not present

## 2021-06-04 DIAGNOSIS — Z8249 Family history of ischemic heart disease and other diseases of the circulatory system: Secondary | ICD-10-CM

## 2021-06-04 DIAGNOSIS — E872 Acidosis, unspecified: Secondary | ICD-10-CM

## 2021-06-04 DIAGNOSIS — R791 Abnormal coagulation profile: Secondary | ICD-10-CM | POA: Diagnosis present

## 2021-06-04 DIAGNOSIS — K7031 Alcoholic cirrhosis of liver with ascites: Secondary | ICD-10-CM | POA: Diagnosis present

## 2021-06-04 DIAGNOSIS — E785 Hyperlipidemia, unspecified: Secondary | ICD-10-CM | POA: Diagnosis present

## 2021-06-04 DIAGNOSIS — N3001 Acute cystitis with hematuria: Secondary | ICD-10-CM | POA: Diagnosis present

## 2021-06-04 DIAGNOSIS — E1142 Type 2 diabetes mellitus with diabetic polyneuropathy: Secondary | ICD-10-CM | POA: Diagnosis not present

## 2021-06-04 DIAGNOSIS — N1832 Chronic kidney disease, stage 3b: Secondary | ICD-10-CM | POA: Diagnosis present

## 2021-06-04 DIAGNOSIS — I10 Essential (primary) hypertension: Secondary | ICD-10-CM | POA: Diagnosis present

## 2021-06-04 DIAGNOSIS — E1122 Type 2 diabetes mellitus with diabetic chronic kidney disease: Secondary | ICD-10-CM | POA: Diagnosis present

## 2021-06-04 DIAGNOSIS — F32A Depression, unspecified: Secondary | ICD-10-CM

## 2021-06-04 DIAGNOSIS — D62 Acute posthemorrhagic anemia: Secondary | ICD-10-CM | POA: Diagnosis not present

## 2021-06-04 DIAGNOSIS — Z794 Long term (current) use of insulin: Secondary | ICD-10-CM

## 2021-06-04 DIAGNOSIS — D61818 Other pancytopenia: Secondary | ICD-10-CM | POA: Diagnosis not present

## 2021-06-04 DIAGNOSIS — K7460 Unspecified cirrhosis of liver: Secondary | ICD-10-CM | POA: Diagnosis not present

## 2021-06-04 DIAGNOSIS — Z90710 Acquired absence of both cervix and uterus: Secondary | ICD-10-CM

## 2021-06-04 DIAGNOSIS — I129 Hypertensive chronic kidney disease with stage 1 through stage 4 chronic kidney disease, or unspecified chronic kidney disease: Secondary | ICD-10-CM | POA: Diagnosis present

## 2021-06-04 DIAGNOSIS — Z7189 Other specified counseling: Secondary | ICD-10-CM | POA: Diagnosis not present

## 2021-06-04 DIAGNOSIS — Z20822 Contact with and (suspected) exposure to covid-19: Secondary | ICD-10-CM | POA: Diagnosis present

## 2021-06-04 DIAGNOSIS — R609 Edema, unspecified: Secondary | ICD-10-CM | POA: Diagnosis not present

## 2021-06-04 DIAGNOSIS — K7581 Nonalcoholic steatohepatitis (NASH): Secondary | ICD-10-CM | POA: Diagnosis not present

## 2021-06-04 DIAGNOSIS — R188 Other ascites: Secondary | ICD-10-CM | POA: Diagnosis not present

## 2021-06-04 DIAGNOSIS — K7682 Hepatic encephalopathy: Secondary | ICD-10-CM | POA: Diagnosis not present

## 2021-06-04 DIAGNOSIS — Z66 Do not resuscitate: Secondary | ICD-10-CM | POA: Diagnosis not present

## 2021-06-04 DIAGNOSIS — E669 Obesity, unspecified: Secondary | ICD-10-CM | POA: Diagnosis present

## 2021-06-04 DIAGNOSIS — B952 Enterococcus as the cause of diseases classified elsewhere: Secondary | ICD-10-CM | POA: Diagnosis present

## 2021-06-04 DIAGNOSIS — K767 Hepatorenal syndrome: Secondary | ICD-10-CM | POA: Diagnosis not present

## 2021-06-04 DIAGNOSIS — J90 Pleural effusion, not elsewhere classified: Secondary | ICD-10-CM | POA: Diagnosis present

## 2021-06-04 DIAGNOSIS — K6389 Other specified diseases of intestine: Secondary | ICD-10-CM | POA: Diagnosis not present

## 2021-06-04 DIAGNOSIS — N179 Acute kidney failure, unspecified: Secondary | ICD-10-CM | POA: Diagnosis not present

## 2021-06-04 DIAGNOSIS — E876 Hypokalemia: Secondary | ICD-10-CM | POA: Diagnosis not present

## 2021-06-04 DIAGNOSIS — E1165 Type 2 diabetes mellitus with hyperglycemia: Secondary | ICD-10-CM | POA: Diagnosis not present

## 2021-06-04 DIAGNOSIS — N390 Urinary tract infection, site not specified: Secondary | ICD-10-CM | POA: Diagnosis not present

## 2021-06-04 DIAGNOSIS — Z886 Allergy status to analgesic agent status: Secondary | ICD-10-CM

## 2021-06-04 DIAGNOSIS — F419 Anxiety disorder, unspecified: Secondary | ICD-10-CM | POA: Diagnosis not present

## 2021-06-04 DIAGNOSIS — K760 Fatty (change of) liver, not elsewhere classified: Secondary | ICD-10-CM | POA: Diagnosis present

## 2021-06-04 DIAGNOSIS — Z96651 Presence of right artificial knee joint: Secondary | ICD-10-CM | POA: Diagnosis present

## 2021-06-04 DIAGNOSIS — E871 Hypo-osmolality and hyponatremia: Secondary | ICD-10-CM | POA: Diagnosis present

## 2021-06-04 DIAGNOSIS — R338 Other retention of urine: Principal | ICD-10-CM

## 2021-06-04 DIAGNOSIS — Z923 Personal history of irradiation: Secondary | ICD-10-CM

## 2021-06-04 DIAGNOSIS — Z888 Allergy status to other drugs, medicaments and biological substances status: Secondary | ICD-10-CM

## 2021-06-04 DIAGNOSIS — Z89611 Acquired absence of right leg above knee: Secondary | ICD-10-CM

## 2021-06-04 DIAGNOSIS — R1084 Generalized abdominal pain: Secondary | ICD-10-CM | POA: Diagnosis not present

## 2021-06-04 DIAGNOSIS — K7469 Other cirrhosis of liver: Secondary | ICD-10-CM | POA: Diagnosis not present

## 2021-06-04 DIAGNOSIS — R310 Gross hematuria: Secondary | ICD-10-CM | POA: Diagnosis not present

## 2021-06-04 DIAGNOSIS — R54 Age-related physical debility: Secondary | ICD-10-CM | POA: Diagnosis present

## 2021-06-04 DIAGNOSIS — K7040 Alcoholic hepatic failure without coma: Secondary | ICD-10-CM | POA: Diagnosis present

## 2021-06-04 DIAGNOSIS — Z6823 Body mass index (BMI) 23.0-23.9, adult: Secondary | ICD-10-CM

## 2021-06-04 DIAGNOSIS — Z8589 Personal history of malignant neoplasm of other organs and systems: Secondary | ICD-10-CM

## 2021-06-04 DIAGNOSIS — Z515 Encounter for palliative care: Secondary | ICD-10-CM | POA: Diagnosis not present

## 2021-06-04 DIAGNOSIS — Z79891 Long term (current) use of opiate analgesic: Secondary | ICD-10-CM

## 2021-06-04 DIAGNOSIS — Z961 Presence of intraocular lens: Secondary | ICD-10-CM | POA: Diagnosis present

## 2021-06-04 DIAGNOSIS — R339 Retention of urine, unspecified: Secondary | ICD-10-CM | POA: Diagnosis not present

## 2021-06-04 DIAGNOSIS — Z79899 Other long term (current) drug therapy: Secondary | ICD-10-CM

## 2021-06-04 DIAGNOSIS — Z7984 Long term (current) use of oral hypoglycemic drugs: Secondary | ICD-10-CM

## 2021-06-04 LAB — LIPASE, BLOOD: Lipase: 49 U/L (ref 11–51)

## 2021-06-04 LAB — CBC WITH DIFFERENTIAL/PLATELET
Abs Immature Granulocytes: 0.06 10*3/uL (ref 0.00–0.07)
Basophils Absolute: 0 10*3/uL (ref 0.0–0.1)
Basophils Relative: 1 %
Eosinophils Absolute: 0.3 10*3/uL (ref 0.0–0.5)
Eosinophils Relative: 5 %
HCT: 29.9 % — ABNORMAL LOW (ref 36.0–46.0)
Hemoglobin: 9.7 g/dL — ABNORMAL LOW (ref 12.0–15.0)
Immature Granulocytes: 1 %
Lymphocytes Relative: 12 %
Lymphs Abs: 0.7 10*3/uL (ref 0.7–4.0)
MCH: 32.6 pg (ref 26.0–34.0)
MCHC: 32.4 g/dL (ref 30.0–36.0)
MCV: 100.3 fL — ABNORMAL HIGH (ref 80.0–100.0)
Monocytes Absolute: 0.6 10*3/uL (ref 0.1–1.0)
Monocytes Relative: 11 %
Neutro Abs: 3.9 10*3/uL (ref 1.7–7.7)
Neutrophils Relative %: 70 %
Platelets: 79 10*3/uL — ABNORMAL LOW (ref 150–400)
RBC: 2.98 MIL/uL — ABNORMAL LOW (ref 3.87–5.11)
RDW: 17.6 % — ABNORMAL HIGH (ref 11.5–15.5)
WBC: 5.6 10*3/uL (ref 4.0–10.5)
nRBC: 0 % (ref 0.0–0.2)

## 2021-06-04 LAB — COMPREHENSIVE METABOLIC PANEL
ALT: 57 U/L — ABNORMAL HIGH (ref 0–44)
AST: 79 U/L — ABNORMAL HIGH (ref 15–41)
Albumin: 2.3 g/dL — ABNORMAL LOW (ref 3.5–5.0)
Alkaline Phosphatase: 104 U/L (ref 38–126)
Anion gap: 13 (ref 5–15)
BUN: 32 mg/dL — ABNORMAL HIGH (ref 8–23)
CO2: 11 mmol/L — ABNORMAL LOW (ref 22–32)
Calcium: 8.2 mg/dL — ABNORMAL LOW (ref 8.9–10.3)
Chloride: 101 mmol/L (ref 98–111)
Creatinine, Ser: 1.64 mg/dL — ABNORMAL HIGH (ref 0.44–1.00)
GFR, Estimated: 33 mL/min — ABNORMAL LOW (ref >60)
Glucose, Bld: 102 mg/dL — ABNORMAL HIGH (ref 70–99)
Potassium: 4.3 mmol/L (ref 3.5–5.1)
Sodium: 125 mmol/L — ABNORMAL LOW (ref 135–145)
Total Bilirubin: 1.6 mg/dL — ABNORMAL HIGH (ref 0.3–1.2)
Total Protein: 5.5 g/dL — ABNORMAL LOW (ref 6.5–8.1)

## 2021-06-04 LAB — ETHANOL: Alcohol, Ethyl (B): <10 mg/dL (ref <10)

## 2021-06-04 LAB — AMMONIA: Ammonia: 35 umol/L (ref 9–35)

## 2021-06-04 LAB — LACTIC ACID, PLASMA: Lactic Acid, Venous: 5.2 mmol/L (ref 0.5–1.9)

## 2021-06-04 NOTE — ED Triage Notes (Signed)
Pt BIB [facility] from home for urinary retention ?

## 2021-06-05 ENCOUNTER — Emergency Department (HOSPITAL_COMMUNITY): Payer: Medicare Other

## 2021-06-05 ENCOUNTER — Other Ambulatory Visit: Payer: Self-pay

## 2021-06-05 ENCOUNTER — Encounter (HOSPITAL_COMMUNITY): Payer: Self-pay | Admitting: Family Medicine

## 2021-06-05 DIAGNOSIS — E876 Hypokalemia: Secondary | ICD-10-CM | POA: Diagnosis not present

## 2021-06-05 DIAGNOSIS — R339 Retention of urine, unspecified: Secondary | ICD-10-CM | POA: Diagnosis not present

## 2021-06-05 DIAGNOSIS — E785 Hyperlipidemia, unspecified: Secondary | ICD-10-CM | POA: Diagnosis present

## 2021-06-05 DIAGNOSIS — E871 Hypo-osmolality and hyponatremia: Secondary | ICD-10-CM | POA: Diagnosis present

## 2021-06-05 DIAGNOSIS — R338 Other retention of urine: Secondary | ICD-10-CM | POA: Diagnosis not present

## 2021-06-05 DIAGNOSIS — R791 Abnormal coagulation profile: Secondary | ICD-10-CM | POA: Diagnosis present

## 2021-06-05 DIAGNOSIS — R188 Other ascites: Secondary | ICD-10-CM | POA: Diagnosis not present

## 2021-06-05 DIAGNOSIS — K7460 Unspecified cirrhosis of liver: Secondary | ICD-10-CM | POA: Diagnosis not present

## 2021-06-05 DIAGNOSIS — Z66 Do not resuscitate: Secondary | ICD-10-CM | POA: Diagnosis not present

## 2021-06-05 DIAGNOSIS — E1142 Type 2 diabetes mellitus with diabetic polyneuropathy: Secondary | ICD-10-CM | POA: Diagnosis present

## 2021-06-05 DIAGNOSIS — K6389 Other specified diseases of intestine: Secondary | ICD-10-CM | POA: Diagnosis not present

## 2021-06-05 DIAGNOSIS — Z515 Encounter for palliative care: Secondary | ICD-10-CM | POA: Diagnosis not present

## 2021-06-05 DIAGNOSIS — Z20822 Contact with and (suspected) exposure to covid-19: Secondary | ICD-10-CM | POA: Diagnosis present

## 2021-06-05 DIAGNOSIS — N390 Urinary tract infection, site not specified: Secondary | ICD-10-CM

## 2021-06-05 DIAGNOSIS — N3289 Other specified disorders of bladder: Secondary | ICD-10-CM | POA: Diagnosis not present

## 2021-06-05 DIAGNOSIS — Z7189 Other specified counseling: Secondary | ICD-10-CM

## 2021-06-05 DIAGNOSIS — I10 Essential (primary) hypertension: Secondary | ICD-10-CM

## 2021-06-05 DIAGNOSIS — N1832 Chronic kidney disease, stage 3b: Secondary | ICD-10-CM | POA: Diagnosis present

## 2021-06-05 DIAGNOSIS — D61818 Other pancytopenia: Secondary | ICD-10-CM | POA: Diagnosis not present

## 2021-06-05 DIAGNOSIS — K767 Hepatorenal syndrome: Secondary | ICD-10-CM | POA: Diagnosis not present

## 2021-06-05 DIAGNOSIS — E669 Obesity, unspecified: Secondary | ICD-10-CM | POA: Diagnosis present

## 2021-06-05 DIAGNOSIS — J90 Pleural effusion, not elsewhere classified: Secondary | ICD-10-CM | POA: Diagnosis present

## 2021-06-05 DIAGNOSIS — F419 Anxiety disorder, unspecified: Secondary | ICD-10-CM

## 2021-06-05 DIAGNOSIS — E872 Acidosis, unspecified: Secondary | ICD-10-CM | POA: Diagnosis present

## 2021-06-05 DIAGNOSIS — K7682 Hepatic encephalopathy: Secondary | ICD-10-CM | POA: Diagnosis not present

## 2021-06-05 DIAGNOSIS — K7031 Alcoholic cirrhosis of liver with ascites: Secondary | ICD-10-CM | POA: Diagnosis present

## 2021-06-05 DIAGNOSIS — B952 Enterococcus as the cause of diseases classified elsewhere: Secondary | ICD-10-CM | POA: Diagnosis present

## 2021-06-05 DIAGNOSIS — I129 Hypertensive chronic kidney disease with stage 1 through stage 4 chronic kidney disease, or unspecified chronic kidney disease: Secondary | ICD-10-CM | POA: Diagnosis present

## 2021-06-05 DIAGNOSIS — D62 Acute posthemorrhagic anemia: Secondary | ICD-10-CM | POA: Diagnosis not present

## 2021-06-05 DIAGNOSIS — R310 Gross hematuria: Secondary | ICD-10-CM | POA: Diagnosis not present

## 2021-06-05 DIAGNOSIS — N179 Acute kidney failure, unspecified: Secondary | ICD-10-CM | POA: Diagnosis not present

## 2021-06-05 DIAGNOSIS — F32A Depression, unspecified: Secondary | ICD-10-CM

## 2021-06-05 DIAGNOSIS — E1122 Type 2 diabetes mellitus with diabetic chronic kidney disease: Secondary | ICD-10-CM | POA: Diagnosis present

## 2021-06-05 DIAGNOSIS — N3001 Acute cystitis with hematuria: Secondary | ICD-10-CM | POA: Diagnosis present

## 2021-06-05 DIAGNOSIS — K7040 Alcoholic hepatic failure without coma: Secondary | ICD-10-CM | POA: Diagnosis present

## 2021-06-05 DIAGNOSIS — I959 Hypotension, unspecified: Secondary | ICD-10-CM | POA: Diagnosis not present

## 2021-06-05 LAB — RESP PANEL BY RT-PCR (FLU A&B, COVID) ARPGX2
Influenza A by PCR: NEGATIVE
Influenza B by PCR: NEGATIVE
SARS Coronavirus 2 by RT PCR: NEGATIVE

## 2021-06-05 LAB — NA AND K (SODIUM & POTASSIUM), RAND UR
Potassium Urine: 12 mmol/L
Sodium, Ur: 36 mmol/L

## 2021-06-05 LAB — URINALYSIS, ROUTINE W REFLEX MICROSCOPIC
Bilirubin Urine: NEGATIVE
Glucose, UA: NEGATIVE mg/dL
Ketones, ur: NEGATIVE mg/dL
Nitrite: NEGATIVE
Protein, ur: 100 mg/dL — AB
RBC / HPF: >50 RBC/hpf — ABNORMAL HIGH (ref 0–5)
Specific Gravity, Urine: 1.013 (ref 1.005–1.030)
WBC, UA: >50 WBC/hpf — ABNORMAL HIGH (ref 0–5)
pH: 6 (ref 5.0–8.0)

## 2021-06-05 LAB — CBC
HCT: 27.8 % — ABNORMAL LOW (ref 36.0–46.0)
Hemoglobin: 9 g/dL — ABNORMAL LOW (ref 12.0–15.0)
MCH: 33 pg (ref 26.0–34.0)
MCHC: 32.4 g/dL (ref 30.0–36.0)
MCV: 101.8 fL — ABNORMAL HIGH (ref 80.0–100.0)
Platelets: 61 10*3/uL — ABNORMAL LOW (ref 150–400)
RBC: 2.73 MIL/uL — ABNORMAL LOW (ref 3.87–5.11)
RDW: 17.7 % — ABNORMAL HIGH (ref 11.5–15.5)
WBC: 4.6 10*3/uL (ref 4.0–10.5)
nRBC: 0 % (ref 0.0–0.2)

## 2021-06-05 LAB — GLUCOSE, CAPILLARY
Glucose-Capillary: 121 mg/dL — ABNORMAL HIGH (ref 70–99)
Glucose-Capillary: 186 mg/dL — ABNORMAL HIGH (ref 70–99)
Glucose-Capillary: 179 mg/dL — ABNORMAL HIGH (ref 70–99)

## 2021-06-05 LAB — OSMOLALITY, URINE: Osmolality, Ur: 300 mOsm/kg (ref 300–900)

## 2021-06-05 LAB — BASIC METABOLIC PANEL
Anion gap: 10 (ref 5–15)
BUN: 30 mg/dL — ABNORMAL HIGH (ref 8–23)
CO2: 11 mmol/L — ABNORMAL LOW (ref 22–32)
Calcium: 7.6 mg/dL — ABNORMAL LOW (ref 8.9–10.3)
Chloride: 103 mmol/L (ref 98–111)
Creatinine, Ser: 1.43 mg/dL — ABNORMAL HIGH (ref 0.44–1.00)
GFR, Estimated: 39 mL/min — ABNORMAL LOW (ref >60)
Glucose, Bld: 103 mg/dL — ABNORMAL HIGH (ref 70–99)
Potassium: 4.3 mmol/L (ref 3.5–5.1)
Sodium: 124 mmol/L — ABNORMAL LOW (ref 135–145)

## 2021-06-05 LAB — LACTIC ACID, PLASMA
Lactic Acid, Venous: 3.2 mmol/L (ref 0.5–1.9)
Lactic Acid, Venous: 3.4 mmol/L (ref 0.5–1.9)
Lactic Acid, Venous: 4.7 mmol/L (ref 0.5–1.9)

## 2021-06-05 LAB — PROTIME-INR
INR: 1.5 — ABNORMAL HIGH (ref 0.8–1.2)
Prothrombin Time: 18.1 seconds — ABNORMAL HIGH (ref 11.4–15.2)

## 2021-06-05 LAB — T4, FREE: Free T4: 1.07 ng/dL (ref 0.61–1.12)

## 2021-06-05 LAB — HEMOGLOBIN AND HEMATOCRIT, BLOOD
HCT: 27.9 % — ABNORMAL LOW (ref 36.0–46.0)
HCT: 26.8 % — ABNORMAL LOW (ref 36.0–46.0)
HCT: 24.1 % — ABNORMAL LOW (ref 36.0–46.0)
HCT: 19.3 % — ABNORMAL LOW (ref 36.0–46.0)
Hemoglobin: 9 g/dL — ABNORMAL LOW (ref 12.0–15.0)
Hemoglobin: 8.7 g/dL — ABNORMAL LOW (ref 12.0–15.0)
Hemoglobin: 7.7 g/dL — ABNORMAL LOW (ref 12.0–15.0)
Hemoglobin: 6.4 g/dL — CL (ref 12.0–15.0)

## 2021-06-05 LAB — CBG MONITORING, ED: Glucose-Capillary: 129 mg/dL — ABNORMAL HIGH (ref 70–99)

## 2021-06-05 LAB — OSMOLALITY: Osmolality: 277 mOsm/kg (ref 275–295)

## 2021-06-05 LAB — TSH: TSH: 5.056 u[IU]/mL — ABNORMAL HIGH (ref 0.350–4.500)

## 2021-06-05 MED ORDER — CHLORHEXIDINE GLUCONATE CLOTH 2 % EX PADS
6.0000 | MEDICATED_PAD | Freq: Every day | CUTANEOUS | Status: DC
  Administered 2021-06-06 – 2021-06-08 (×3): 6 via TOPICAL

## 2021-06-05 MED ORDER — ACETAMINOPHEN 325 MG PO TABS
650.0000 mg | ORAL_TABLET | Freq: Four times a day (QID) | ORAL | Status: DC | PRN
  Administered 2021-06-05: 650 mg via ORAL
  Filled 2021-06-05 (×2): qty 2

## 2021-06-05 MED ORDER — MESALAMINE 1000 MG RE SUPP: 1000.0000 mg | Freq: Every day | RECTAL | Status: DC

## 2021-06-05 MED ORDER — SODIUM BICARBONATE 8.4 % IV SOLN
INTRAVENOUS | Status: DC
  Filled 2021-06-05 (×10): qty 1000

## 2021-06-05 MED ORDER — TRAZODONE HCL 50 MG PO TABS: 50.0000 mg | ORAL_TABLET | Freq: Every evening | ORAL | Status: DC | PRN

## 2021-06-05 MED ORDER — ALBUMIN HUMAN 25 % IV SOLN
50.0000 g | Freq: Four times a day (QID) | INTRAVENOUS | Status: AC
  Administered 2021-06-05 – 2021-06-06 (×4): 50 g via INTRAVENOUS
  Filled 2021-06-05 (×6): qty 200

## 2021-06-05 MED ORDER — SODIUM CHLORIDE 0.9 % IV BOLUS
1000.0000 mL | Freq: Once | INTRAVENOUS | Status: AC
  Administered 2021-06-05: 1000 mL via INTRAVENOUS

## 2021-06-05 MED ORDER — ATORVASTATIN CALCIUM 40 MG PO TABS
80.0000 mg | ORAL_TABLET | Freq: Every day | ORAL | Status: DC
  Administered 2021-06-05 – 2021-06-07 (×3): 80 mg via ORAL
  Filled 2021-06-05 (×3): qty 2

## 2021-06-05 MED ORDER — ONDANSETRON 4 MG PO TBDP: 4.0000 mg | ORAL_TABLET | Freq: Three times a day (TID) | ORAL | Status: DC | PRN

## 2021-06-05 MED ORDER — SODIUM BICARBONATE 8.4 % IV SOLN
INTRAVENOUS | Status: AC
  Filled 2021-06-05: qty 150

## 2021-06-05 MED ORDER — PANTOPRAZOLE SODIUM 40 MG PO TBEC
40.0000 mg | DELAYED_RELEASE_TABLET | Freq: Every day | ORAL | Status: DC
Start: 2021-06-05 — End: 2021-06-08
  Administered 2021-06-05 – 2021-06-07 (×3): 40 mg via ORAL
  Filled 2021-06-05 (×3): qty 1

## 2021-06-05 MED ORDER — ENOXAPARIN SODIUM 30 MG/0.3ML IJ SOSY: 30.0000 mg | PREFILLED_SYRINGE | INTRAMUSCULAR | Status: DC

## 2021-06-05 MED ORDER — GABAPENTIN 300 MG PO CAPS
300.0000 mg | ORAL_CAPSULE | Freq: Every day | ORAL | Status: DC
Start: 2021-06-05 — End: 2021-06-08
  Administered 2021-06-05 – 2021-06-07 (×3): 300 mg via ORAL
  Filled 2021-06-05 (×3): qty 1

## 2021-06-05 MED ORDER — IOHEXOL 300 MG/ML  SOLN
75.0000 mL | Freq: Once | INTRAMUSCULAR | Status: AC | PRN
  Administered 2021-06-05: 75 mL via INTRAVENOUS

## 2021-06-05 MED ORDER — ONDANSETRON HCL 4 MG/2ML IJ SOLN
4.0000 mg | Freq: Once | INTRAMUSCULAR | Status: AC
  Administered 2021-06-05: 4 mg via INTRAVENOUS
  Filled 2021-06-05: qty 2

## 2021-06-05 MED ORDER — SODIUM CHLORIDE 0.9 % IV SOLN: 1.0000 g | INTRAVENOUS | Status: DC

## 2021-06-05 MED ORDER — ONDANSETRON HCL 4 MG/2ML IJ SOLN
4.0000 mg | Freq: Four times a day (QID) | INTRAMUSCULAR | Status: DC | PRN
  Administered 2021-06-08 (×2): 4 mg via INTRAVENOUS
  Filled 2021-06-05 (×2): qty 2

## 2021-06-05 MED ORDER — BUPROPION HCL ER (XL) 150 MG PO TB24
150.0000 mg | ORAL_TABLET | Freq: Every day | ORAL | Status: DC
  Administered 2021-06-05 – 2021-06-07 (×3): 150 mg via ORAL
  Filled 2021-06-05 (×3): qty 1

## 2021-06-05 MED ORDER — SODIUM CHLORIDE 0.9 % IV SOLN: INTRAVENOUS | Status: DC

## 2021-06-05 MED ORDER — LACTULOSE 10 GM/15ML PO SOLN
10.0000 g | Freq: Three times a day (TID) | ORAL | Status: DC
  Administered 2021-06-05 – 2021-06-07 (×7): 10 g via ORAL
  Filled 2021-06-05 (×7): qty 30

## 2021-06-05 MED ORDER — TRAZODONE HCL 50 MG PO TABS: 25.0000 mg | ORAL_TABLET | Freq: Every evening | ORAL | Status: DC | PRN

## 2021-06-05 MED ORDER — ATENOLOL 25 MG PO TABS
50.0000 mg | ORAL_TABLET | Freq: Every day | ORAL | Status: DC
  Administered 2021-06-05 – 2021-06-06 (×2): 50 mg via ORAL
  Filled 2021-06-05 (×2): qty 2

## 2021-06-05 MED ORDER — INSULIN GLARGINE-YFGN 100 UNIT/ML ~~LOC~~ SOLN
17.0000 [IU] | Freq: Every day | SUBCUTANEOUS | Status: DC
Start: 2021-06-05 — End: 2021-06-05

## 2021-06-05 MED ORDER — CIPROFLOXACIN IN D5W 400 MG/200ML IV SOLN
400.0000 mg | Freq: Once | INTRAVENOUS | Status: AC
  Administered 2021-06-05: 400 mg via INTRAVENOUS
  Filled 2021-06-05: qty 200

## 2021-06-05 MED ORDER — ALPRAZOLAM 0.25 MG PO TABS
0.2500 mg | ORAL_TABLET | Freq: Two times a day (BID) | ORAL | Status: DC
  Administered 2021-06-05 – 2021-06-06 (×3): 0.25 mg via ORAL
  Filled 2021-06-05 (×3): qty 1

## 2021-06-05 MED ORDER — FERROUS SULFATE 325 (65 FE) MG PO TABS
325.0000 mg | ORAL_TABLET | Freq: Two times a day (BID) | ORAL | Status: DC
  Administered 2021-06-05 – 2021-06-08 (×6): 325 mg via ORAL
  Filled 2021-06-05 (×7): qty 1

## 2021-06-05 MED ORDER — SODIUM CHLORIDE 0.9 % IV BOLUS
500.0000 mL | Freq: Once | INTRAVENOUS | Status: AC
  Administered 2021-06-05: 500 mL via INTRAVENOUS

## 2021-06-05 MED ORDER — ACETAMINOPHEN 650 MG RE SUPP: 650.0000 mg | Freq: Four times a day (QID) | RECTAL | Status: DC | PRN

## 2021-06-05 MED ORDER — SODIUM CHLORIDE 0.9% IV SOLUTION: Freq: Once | INTRAVENOUS | Status: DC

## 2021-06-05 MED ORDER — ONDANSETRON HCL 4 MG PO TABS: 4.0000 mg | ORAL_TABLET | Freq: Four times a day (QID) | ORAL | Status: DC | PRN

## 2021-06-05 MED ORDER — ORAL CARE MOUTH RINSE
15.0000 mL | Freq: Two times a day (BID) | OROMUCOSAL | Status: DC
  Administered 2021-06-05 – 2021-06-08 (×5): 15 mL via OROMUCOSAL

## 2021-06-05 MED ORDER — MORPHINE SULFATE (PF) 2 MG/ML IV SOLN
2.0000 mg | INTRAVENOUS | Status: DC | PRN
  Administered 2021-06-07 – 2021-06-09 (×4): 2 mg via INTRAVENOUS
  Filled 2021-06-05 (×4): qty 1

## 2021-06-05 MED ORDER — RIFAXIMIN 550 MG PO TABS
550.0000 mg | ORAL_TABLET | Freq: Two times a day (BID) | ORAL | Status: DC
  Administered 2021-06-05 – 2021-06-07 (×6): 550 mg via ORAL
  Filled 2021-06-05 (×6): qty 1

## 2021-06-05 MED ORDER — MAGNESIUM HYDROXIDE 400 MG/5ML PO SUSP: 30.0000 mL | Freq: Every day | ORAL | Status: DC | PRN

## 2021-06-05 MED ORDER — ESCITALOPRAM OXALATE 10 MG PO TABS
20.0000 mg | ORAL_TABLET | Freq: Every day | ORAL | Status: DC
  Administered 2021-06-05 – 2021-06-07 (×3): 20 mg via ORAL
  Filled 2021-06-05 (×3): qty 2

## 2021-06-05 MED ORDER — SODIUM CHLORIDE 0.9 % IV SOLN
2.0000 g | INTRAVENOUS | Status: DC
  Administered 2021-06-05: 2 g via INTRAVENOUS
  Filled 2021-06-05: qty 20

## 2021-06-05 MED ORDER — FLUTICASONE PROPIONATE 50 MCG/ACT NA SUSP
1.0000 | Freq: Every day | NASAL | Status: DC
  Administered 2021-06-05 – 2021-06-07 (×3): 1 via NASAL
  Filled 2021-06-05: qty 16

## 2021-06-05 MED ORDER — INSULIN ASPART 100 UNIT/ML IJ SOLN
0.0000 [IU] | Freq: Three times a day (TID) | INTRAMUSCULAR | Status: DC
  Administered 2021-06-05: 2 [IU] via SUBCUTANEOUS
  Administered 2021-06-05: 1 [IU] via SUBCUTANEOUS
  Administered 2021-06-05 – 2021-06-06 (×3): 2 [IU] via SUBCUTANEOUS
  Administered 2021-06-06 – 2021-06-07 (×2): 1 [IU] via SUBCUTANEOUS
  Administered 2021-06-07 – 2021-06-08 (×2): 2 [IU] via SUBCUTANEOUS
  Filled 2021-06-05: qty 1

## 2021-06-05 NOTE — ED Notes (Signed)
Confirmed orders with admitting Dr. Sidney Ace RN to start sodium bicarb drip and discontinue drip until completion of sodium bicarb fluids.  ?

## 2021-06-05 NOTE — Assessment & Plan Note (Signed)
-   The patient will be placed on supplement coverage with NovoLog. ?- We will continue basal coverage. ? ?

## 2021-06-05 NOTE — Assessment & Plan Note (Deleted)
-   This is likely secondary to dehydration and is improving. ?- The patient does not meet criteria for sepsis. ?

## 2021-06-05 NOTE — ED Notes (Signed)
Date and time results received: 06/05/21 0845 ?(use smartphrase ".now" to insert current time) ? ?Test: Lactic Acid ?Critical Value: 3.4 ? ?Name of Provider Notified: Dr Manuella Ghazi  ? ?Orders Received? Or Actions Taken?:  ?

## 2021-06-05 NOTE — Plan of Care (Signed)
?  Problem: Education: ?Goal: Knowledge of General Education information will improve ?Description: Including pain rating scale, medication(s)/side effects and non-pharmacologic comfort measures ?Outcome: Progressing ?  ?Problem: Health Behavior/Discharge Planning: ?Goal: Ability to manage health-related needs will improve ?Outcome: Progressing ?  ?Problem: Nutrition: ?Goal: Adequate nutrition will be maintained ?Outcome: Progressing ?  ?

## 2021-06-05 NOTE — Assessment & Plan Note (Signed)
-   We will continue Wellbutrin XL, Lexapro and Xanax. ?

## 2021-06-05 NOTE — Progress Notes (Signed)
Date and time results received: 06/05/21 11:41 ? ?(use smartphrase ".now" to insert current time) ? ?Test: hgb ?Critical Value: 6.4 ? ?Name of Provider Notified: Asia Clearence Ped DO ? ?Orders Received? Or Actions Taken?: See new orders ?

## 2021-06-05 NOTE — Assessment & Plan Note (Signed)
-   We will continue her hypertensives. ?

## 2021-06-05 NOTE — Assessment & Plan Note (Signed)
-   We will continue lactulose and Xifaxan ?

## 2021-06-05 NOTE — Assessment & Plan Note (Addendum)
-   The patient was admitted to a medical bed. ?- We will continue antibiotic therapy with IV Rocephin. ?- We will continue hydration with IV normal saline. ?- The patient has associated urinary retention. ?- We will follow urine culture. ?

## 2021-06-05 NOTE — Progress Notes (Signed)
Per HPI: ?Brittany Russo is a 76 y.o. Caucasian female with medical history significant for asthma, liver cirrhosis, type II obese mellitus, depression, hypertension, dyslipidemia, anxiety and anasarca, who presented to the emergency room with acute onset of urinary retention.  The patient's been having difficulty urinating for the last few hours before presentation to the ER.  She vomited once during the day.  She admitted to abdominal distention.  She stated that she has been compliant with her medications.  No fever or chills.  No chest pain or dyspnea or cough or wheezing.  She did not have any urine output yesterday.  She was noted to have bloody urine in the ER. ? ?06/05/21: Patient was admitted for UTI with recent discharge for same on 3/9.  She was previously noted to have seizure activity due to benzodiazepine withdrawal, but benzodiazepines have been reinitiated here.  She is also noted to be hyponatremic and was initially started on IV normal saline which will be discontinued given her increased volume status.  Plan to push albumin to help redistribute fluids and maintain on bicarbonate as ordered.  Monitor repeat labs.  Lactic acidosis decreasing.  Follow urine cultures. ? ?-Had extensive discussion with family at bedside regarding [facility] and need for initiation of hospice care given deteriorating overall condition and recurrent hospitalizations.  Palliative care consulted. ? ?Total care time: 60 minutes. ?

## 2021-06-05 NOTE — Consult Note (Signed)
? ?                                                                                ?Consultation Note ?Date: 06/05/2021  ? ?Patient Name: Brittany Russo  ?DOB: [birth year]  MRN: [id number]  Age / Sex: 76 y.o., female  ?PCP: Rosalee Kaufman, PA-C ?Referring Physician: Heath Lark D, DO ? ?Reason for Consultation: Establishing goals of care and Hospice Evaluation ? ?HPI/Patient Profile: 76 y.o. female  with past medical history of asthma, liver cirrhosis, type II obese mellitus, depression, hypertension, dyslipidemia, anxiety and anasarca admitted on 06/04/2021 with Acute lower UTI.  ? ?Clinical Assessment and Goals of Care: ?I have reviewed medical records including EPIC notes, labs and imaging, received report from RN, assessed the patient and then met at the bedside along with husband of 50+ years, [name], daughters Melanieand Deanna and pastor Randall Hiss, to discuss diagnosis prognosis, Arp, EOL wishes, disposition and options. ? ?I introduced Palliative Medicine as specialized medical care for people living with serious illness. It focuses on providing relief from the symptoms and stress of a serious illness. The goal is to improve quality of life for both the patient and the family. ? ?We discussed a brief life review of the patient.  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 They have 2 daughters and a son.  ? ?We then focused on their current illness. Family requests that we speak in the hallway.  We talk about Mrs. Carithers's acute and chronic health concerns.  We talk about the difficulties of tapping fluid removal.  We talk about The natural disease trajectory and expectations at EOL were discussed. ? ?Advanced directives, concepts specific to code status, artifical feeding and hydration, and rehospitalization were considered and discussed.  We talk about the benefits of hospice care.  I share that she would qualify for residential hospice at this time.  We talk about preferred place of  death.  Family states home would be preferred place of death. ? ?Hospice and Palliative Care services outpatient were explained and offered.  Family is requesting home with the benefits of "treat the treatable" hospice.  Provider choice is [facility].  ? ?Discussed the importance of continued conversation with family and the medical providers regarding overall plan of care and treatment options, ensuring decisions are within the context of the patient?s values and GOCs.  Questions and concerns were addressed. The family was encouraged to call with questions or concerns.  PMT will continue to support holistically. ? ?Conference with attending, bedside nursing staff, transition of care team related to patient condition, needs, goals of care, disposition. ? ? ?HCPOA  ?NEXT OF KIN -husband of 53 years, John.  He leans on children for help and advice ?  ? ?SUMMARY OF RECOMMENDATIONS   ?Home with hospice of [facility].  ?Considering code status ?Considering do not rehospitalize/preferred place of death ? ? ?Code Status/Advance Care Planning: ?Full code -we talked at length about the concept of "treat the treatable, but allowing natural death".  We also talked about the concept of "let nature take its course".  We talked about the realities of CPR without being graphic. ? ?  Symptom Management:  ?Per hospitalist, no additional needs at this time. ? ?Palliative Prophylaxis:  ?Frequent Pain Assessment and Oral Care ? ?Additional Recommendations (Limitations, Scope, Preferences): ?Full Scope Treatment ? ?Psycho-social/Spiritual:  ?Desire for further Chaplaincy support:no ?Additional Recommendations: Caregiving  Support/Resources and Education on Hospice ? ?Prognosis:  ?< 4 weeks, would not be surprising based on chronic illness burden, poor functional status, low albumin. ? ?Discharge Planning:  home with out patient palliative service with [facility]   ? ?  ? ?Primary Diagnoses: ?Present on Admission: ?  Hyponatremia ? Lactic acidosis ? ? ?I have reviewed the medical record, interviewed the patient and family, and examined the patient. The following aspects are pertinent. ? ?Past Medical History:  ?Diagnosis Date  ? Acute and subacute hepatic failure without coma   ? Allergy   ? Anasarca   ? Anxiety   ? Arthritis   ? Ascites 09/2018  ? no SBP on 850 cc tap 10/13/18  ? Asthma   ? allery induced  ? Cirrhosis of liver ([place]) ~ 2004  ? from NAFLD.  Dx ~ 2004.  Dr Dorcas Mcmurray at [facility]/    ? Depression   ? Diabetes mellitus without complication ([place])   ? type 2  ? Eczema of both hands   ? Endometrial cancer ([place]) 1996, 2003  ? hysterectomy 1996.  recurrence 2003, treated with radiation.    ? Generalized edema   ? GI hemorrhage   ? Hematochezia 2009  ? 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 in [place]  ? Hyperlipidemia   ? Hypertension   ? Muscle weakness   ? Seizures ([place])   ? last seizure June 07, 2014   ? Thrombocytopenia ([place])   ? Thyroid nodule   ? ?Social History  ? ?Socioeconomic History  ? Marital status: Married  ?  Spouse name: Not on file  ? Number of children: Not on file  ? Years of education: Not on file  ? Highest education level: Not on file  ?Occupational History  ? Not on file  ?Tobacco Use  ? Smoking status: Never  ? Smokeless tobacco: Never  ?Vaping Use  ? Vaping Use: Never used  ?Substance and Sexual Activity  ? Alcohol use: No  ? Drug use: No  ? Sexual activity: Not Currently  ?Other Topics Concern  ? Not on file  ?Social History Narrative  ? Not on file  ? ?Social Determinants of Health  ? ?Financial Resource Strain: Not on file  ?Food Insecurity: Not on file  ?Transportation Needs: Not on file  ?Physical Activity: Not on file  ?Stress: Not on file  ?Social Connections: Not on file  ? ?Family History  ?Problem Relation Age of Onset  ? Heart disease Mother   ? Diabetes Mother   ? Heart disease Father   ? Cancer Father   ? Cancer Maternal Grandmother   ? Heart  disease Maternal Grandfather   ? ?Scheduled Meds: ? ALPRAZolam  0.25 mg Oral BID  ? atenolol  50 mg Oral Daily  ? atorvastatin  80 mg Oral Q2000  ? buPROPion  150 mg Oral Daily  ? escitalopram  20 mg Oral Daily  ? ferrous sulfate  325 mg Oral BID WC  ? fluticasone  1 spray Each Nare Daily  ? gabapentin  300 mg Oral QHS  ? insulin aspart  0-9 Units Subcutaneous TID AC & HS  ? lactulose  10 g Oral TID  ? mouth  rinse  15 mL Mouth Rinse BID  ? pantoprazole  40 mg Oral Daily  ? rifaximin  550 mg Oral BID  ? ?Continuous Infusions: ? albumin human 60 mL/hr at 06/05/21 1514  ? cefTRIAXone (ROCEPHIN)  IV    ? sodium bicarbonate 150 mEq in D5W infusion Stopped (06/05/21 1024)  ? ?PRN Meds:.acetaminophen **OR** acetaminophen, magnesium hydroxide, morphine injection, [DISCONTINUED] ondansetron **OR** ondansetron (ZOFRAN) IV, ondansetron, traZODone ?Medications Prior to Admission:  ?Prior to Admission medications   ?Medication Sig Start Date End Date Taking? Authorizing Provider  ?acetaminophen (TYLENOL) 325 MG tablet Take 1-2 tablets (325-650 mg total) by mouth every 4 (four) hours as needed for mild pain. 07/13/19  Yes Love, Ivan Anchors, PA-C  ?ALPRAZolam (XANAX) 0.25 MG tablet Take 1 tablet (0.25 mg total) by mouth 2 (two) times daily. 05/29/21  Yes Emokpae, Courage, MD  ?atenolol (TENORMIN) 25 MG tablet Take 1 tablet (25 mg total) by mouth daily. ?Patient taking differently: Take 50 mg by mouth daily. 10/18/20  Yes Roxan Hockey, MD  ?atorvastatin (LIPITOR) 80 MG tablet Take 80 mg by mouth daily at 8 pm.   Yes [provider]  ?buPROPion (WELLBUTRIN XL) 150 MG 24 hr tablet Take 150 mg by mouth in the morning.    Yes [provider]  ?escitalopram (LEXAPRO) 20 MG tablet Take 20 mg by mouth in the morning.  05/17/19  Yes [provider]  ?ferrous sulfate (FERROUSUL) 325 (65 FE) MG tablet Take 1 tablet (325 mg total) by mouth 2 (two) times daily with a meal. 10/18/20 06/05/21 Yes Emokpae, Courage, MD   ?fluticasone (FLONASE) 50 MCG/ACT nasal spray Place 1 spray into both nostrils in the morning.   Yes [provider]  ?furosemide (LASIX) 20 MG tablet Take 1 tablet (20 mg total) by mouth daily. ?Pati

## 2021-06-05 NOTE — H&P (Addendum)
?  ?  ?[place] ? ? ?PATIENT NAME: Brittany Russo   ? ?MR#:  [id number] ? ?DATE OF BIRTH:  Aug 08, 1945 ? ?DATE OF ADMISSION:  06/04/2021 ? ?PRIMARY CARE PHYSICIAN: Rosalee Kaufman, PA-C  ? ?Patient is coming from: Home ? ?REQUESTING/REFERRING PHYSICIAN: Veryl Speak, MD ? ?CHIEF COMPLAINT:  ? ?Chief Complaint  ?Patient presents with  ? Urinary Retention  ? ? ?HISTORY OF PRESENT ILLNESS:  ?Brittany Russo is a 76 y.o. Caucasian female with medical history significant for asthma, liver cirrhosis, type II obese mellitus, depression, hypertension, dyslipidemia, anxiety and anasarca, who presented to the emergency room with acute onset of urinary retention.  The patient's been having difficulty urinating for the last few hours before presentation to the ER.  She vomited once during the day.  She admitted to abdominal distention.  She stated that she has been compliant with her medications.  No fever or chills.  No chest pain or dyspnea or cough or wheezing.  She did not have any urine output yesterday.  She was noted to have bloody urine in the ER. ? ?ED Course: When she came to the ER BP was 104/53 with otherwise normal vital signs.  Labs reveal hyponatremia 125 and CO2 of 11 with glucose of 102 with BUN of 32 and creatinine 1.64 compared to 1.41 on 05/29/2021, calcium of 8.2 and albumin 2.3 with total protein of 5.5.  AST was 79 and ALT 57 with total bili of 1.6.  Lactic acid was 5.2 and later 4.7.  CBC showed anemia better than previous levels and thrombocytopenia of 79 better than previous level of 41 about 6 days ago.  Urinalysis was positive for UTI.  Alcohol level was less than 10. ? ?Imaging: Abdominal pelvic CT scan revealed large right pleural effusion with basilar atelectasis or consolidation, hepatic cirrhosis with prominent upper abdominal varices and mild splenic enlargement with moderate abdominal and pelvic ascites.  There was no evidence for bowel obstruction.  There was mild wall thickening of the  small bowel and colon that may be due to edema, liver disease or enterocolitis.  It showed aortic atherosclerosis. ? ?The patient was given 1.5 L of IV normal saline, 4 mg of IV Zofran and 400 mg of IV Cipro.  She will be admitted to a medical bed for further evaluation and management. ?PAST MEDICAL HISTORY:  ? ?Past Medical History:  ?Diagnosis Date  ? Acute and subacute hepatic failure without coma   ? Allergy   ? Anasarca   ? Anxiety   ? Arthritis   ? Ascites 09/2018  ? no SBP on 850 cc tap 10/13/18  ? Asthma   ? allery induced  ? Cirrhosis of liver ([place]) ~ 2004  ? from NAFLD.  Dx ~ 2004.  Dr Dorcas Mcmurray at [facility]/    ? Depression   ? Diabetes mellitus without complication ([place])   ? type 2  ? Eczema of both hands   ? Endometrial cancer ([place]) 1996, 2003  ? hysterectomy 1996.  recurrence 2003, treated with radiation.    ? Generalized edema   ? GI hemorrhage   ? Hematochezia 2009  ? 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 in [place]  ? Hyperlipidemia   ? Hypertension   ? Muscle weakness   ? Seizures ([place])   ? last seizure June 07, 2014   ? Thrombocytopenia ([place])   ? Thyroid nodule   ? ? ?PAST SURGICAL HISTORY:  ? ?Past  Surgical History:  ?Procedure Laterality Date  ? AMPUTATION Right 06/29/2019  ? Procedure: AMPUTATION ABOVE KNEE through knee;  Surgeon: Paralee Cancel, MD;  Location: [facility];  Service: Orthopedics;  Laterality: Right;  2 hrs  ? CATARACT EXTRACTION W/PHACO Left 06/04/2017  ? Procedure: CATARACT EXTRACTION PHACO AND INTRAOCULAR LENS PLACEMENT (IOC);  Surgeon: Baruch Goldmann, MD;  Location: [facility];  Service: Ophthalmology;  Laterality: Left;  CDE: 3.90  ? CATARACT EXTRACTION W/PHACO Right 07/15/2017  ? Procedure: CATARACT EXTRACTION PHACO AND INTRAOCULAR LENS PLACEMENT (IOC);  Surgeon: Baruch Goldmann, MD;  Location: [facility];  Service: Ophthalmology;  Laterality: Right;  CDE: 7.60  ? COLONOSCOPY  06/2007  ? in [place]. for hematochzia.  radiation proctitis  ? DILATION AND  CURETTAGE OF UTERUS    ? ESOPHAGOGASTRODUODENOSCOPY  2016  ? ESOPHAGOGASTRODUODENOSCOPY (EGD) WITH PROPOFOL N/A 12/30/2018  ? Procedure: EGD;  Surgeon: Clarene Essex, MD;  [facility]: [facility];  Service: Endoscopy;  Laterality: N/A;  ? EXCISIONAL TOTAL KNEE ARTHROPLASTY Right 01/03/2019  ? Procedure: EXCISIONAL TOTAL KNEE ARTHROPLASTY;  Surgeon: Paralee Cancel, MD;  Location: [facility];  Service: Orthopedics;  Laterality: Right;  ? FLEXIBLE SIGMOIDOSCOPY N/A 12/30/2018  ? Procedure: FLEXIBLE SIGMOIDOSCOPY;  Surgeon: Clarene Essex, MD;  [facility];  Service: Endoscopy;  Laterality: N/A;  ? IR PARACENTESIS  10/13/2018  ? IR PARACENTESIS  07/21/2019  ? IR THORACENTESIS ASP PLEURAL SPACE W/IMG GUIDE  07/19/2019  ? STERIOD INJECTION Left 06/29/2019  ? Procedure: LEFT KNEE INJECTION;  Surgeon: Paralee Cancel, MD;  [facility]: [facility];  Service: Orthopedics;  Laterality: Left;  ? TOTAL ABDOMINAL HYSTERECTOMY  1996  ? TOTAL KNEE ARTHROPLASTY Right 09/22/2018  ? Procedure: TOTAL KNEE ARTHROPLASTY;  Surgeon: Paralee Cancel, MD;  [facility]: [facility];  Service: Orthopedics;  Laterality: Right;  70 mins  ? TOTAL KNEE REVISION Right 09/27/2018  ? Procedure: TOTAL KNEE REVISION;  Surgeon: Paralee Cancel, MD;  Location: [facility];  Service: Orthopedics;  Laterality: Right;  ? TOTAL KNEE REVISION Right 11/22/2018  ? Procedure: repair right knee extensor mechanism;  Surgeon: Paralee Cancel, MD;  Location: [facility];  Service: Orthopedics;  Laterality: Right;  90 mins  ? ? ?SOCIAL HISTORY:  ? ?Social History  ? ?Tobacco Use  ? Smoking status: Never  ? Smokeless tobacco: Never  ?Substance Use Topics  ? Alcohol use: No  ? ? ?FAMILY HISTORY:  ? ?Family History  ?Problem Relation Age of Onset  ? Heart disease Mother   ? Diabetes Mother   ? Heart disease Father   ? Cancer Father   ? Cancer Maternal Grandmother   ? Heart disease Maternal Grandfather   ? ? ?DRUG ALLERGIES:  ? ?Allergies  ?Allergen Reactions  ? Asa [Aspirin] Other (See Comments)  ?  Pt not allergic  but cannot take due to bleeding   ? Other Other (See Comments)  ?  Any jewelry metal-hives and itching   ? ? ?REVIEW OF SYSTEMS:  ? ?ROS ?As per history of present illness. All pertinent systems were reviewed above. Constitutional, HEENT, cardiovascular, respiratory, GI, GU, musculoskeletal, neuro, psychiatric, endocrine, integumentary and hematologic systems were reviewed and are otherwise negative/unremarkable except for positive findings mentioned above in the HPI. ? ? ?MEDICATIONS AT HOME:  ? ?Prior to Admission medications   ?Medication Sig Start Date End Date Taking? Authorizing Provider  ?acetaminophen (TYLENOL) 325 MG tablet Take 1-2 tablets (325-650 mg total) by mouth every 4 (four) hours as needed for mild pain. 07/13/19   Love, Ivan Anchors,  PA-C  ?ALPRAZolam (XANAX) 0.25 MG tablet Take 1 tablet (0.25 mg total) by mouth 2 (two) times daily. 05/29/21   Roxan Hockey, MD  ?atenolol (TENORMIN) 25 MG tablet Take 1 tablet (25 mg total) by mouth daily. ?Patient taking differently: Take 50 mg by mouth daily. 10/18/20   Roxan Hockey, MD  ?atorvastatin (LIPITOR) 80 MG tablet Take 80 mg by mouth daily at 8 pm.    [provider]  ?buPROPion (WELLBUTRIN XL) 150 MG 24 hr tablet Take 150 mg by mouth in the morning.     [provider]  ?escitalopram (LEXAPRO) 20 MG tablet Take 20 mg by mouth in the morning.  05/17/19   [provider]  ?ferrous sulfate (FERROUSUL) 325 (65 FE) MG tablet Take 1 tablet (325 mg total) by mouth 2 (two) times daily with a meal. 10/18/20 05/26/21  Emokpae, Courage, MD  ?fluticasone (FLONASE) 50 MCG/ACT nasal spray Place 1 spray into both nostrils in the morning.    [provider]  ?furosemide (LASIX) 20 MG tablet Take 1 tablet (20 mg total) by mouth daily. ?Patient taking differently: Take 40 mg by mouth daily. 10/18/20   Roxan Hockey, MD  ?gabapentin (NEURONTIN) 300 MG capsule Take 1 capsule (300 mg total) by mouth at bedtime. 05/29/21   Roxan Hockey, MD   ?insulin aspart (NOVOLOG FLEXPEN) 100 UNIT/ML FlexPen Inject 3-12 Units into the skin in the morning and at bedtime. Can be up to 8 units twice a day depending on blood sugar. Take mid afternoon only if

## 2021-06-05 NOTE — ED Notes (Signed)
Patient transported to CT 

## 2021-06-05 NOTE — ED Provider Notes (Signed)
?[facility] ?Provider Note ? ? ?CSN: [id number] ?Arrival date & time: 06/04/21  2113 ? ?  ? ?History ? ?Chief Complaint  ?Patient presents with  ? Urinary Retention  ? ? ?Brittany Russo is a 76 y.o. female. ? ?HPI ?Patient presents with difficulty urinating over the past few hours.  Initially she is alone, but later in the course she is joined by her daughter.  Patient knowledges multiple medical issues.  She arrives via EMS and those individuals note that patient complained of inability to urinate for the past few hours.  She has not noted history of liver disease, reportedly advanced.  She denies other recent changes.  Daughter who arrives late in the course notes that the patient has had 1 episode of vomiting today, has apparently more abdominal girth than usual.  She has been taking her lactulose, seemingly regularly has seen her physician regularly. ?  ? ?Home Medications ?Prior to Admission medications   ?Medication Sig Start Date End Date Taking? Authorizing Provider  ?acetaminophen (TYLENOL) 325 MG tablet Take 1-2 tablets (325-650 mg total) by mouth every 4 (four) hours as needed for mild pain. 07/13/19   Bary Leriche, PA-C  ?ALPRAZolam (XANAX) 0.25 MG tablet Take 1 tablet (0.25 mg total) by mouth 2 (two) times daily. 05/29/21   Roxan Hockey, MD  ?atenolol (TENORMIN) 25 MG tablet Take 1 tablet (25 mg total) by mouth daily. ?Patient taking differently: Take 50 mg by mouth daily. 10/18/20   Roxan Hockey, MD  ?atorvastatin (LIPITOR) 80 MG tablet Take 80 mg by mouth daily at 8 pm.    [provider]  ?buPROPion (WELLBUTRIN XL) 150 MG 24 hr tablet Take 150 mg by mouth in the morning.     [provider]  ?escitalopram (LEXAPRO) 20 MG tablet Take 20 mg by mouth in the morning.  05/17/19   [provider]  ?ferrous sulfate (FERROUSUL) 325 (65 FE) MG tablet Take 1 tablet (325 mg total) by mouth 2 (two) times daily with a meal. 10/18/20 05/26/21  Emokpae, Courage, MD   ?fluticasone (FLONASE) 50 MCG/ACT nasal spray Place 1 spray into both nostrils in the morning.    [provider]  ?furosemide (LASIX) 20 MG tablet Take 1 tablet (20 mg total) by mouth daily. ?Patient taking differently: Take 40 mg by mouth daily. 10/18/20   Roxan Hockey, MD  ?gabapentin (NEURONTIN) 300 MG capsule Take 1 capsule (300 mg total) by mouth at bedtime. 05/29/21   Roxan Hockey, MD  ?insulin aspart (NOVOLOG FLEXPEN) 100 UNIT/ML FlexPen Inject 3-12 Units into the skin in the morning and at bedtime. Can be up to 8 units twice a day depending on blood sugar. Take mid afternoon only if bloodsugar  is over 180  Takes in evening if it is 200 or over give up to 10 units per daughter Sliding scale    [provider]  ?insulin glargine (LANTUS) 100 UNIT/ML injection 17 Units daily.    [provider]  ?lactulose (CHRONULAC) 10 GM/15ML solution Take 15 mLs (10 g total) by mouth 3 (three) times daily. 04/11/21   Johnson, Clanford L, MD  ?mesalamine (CANASA) 1000 MG suppository Place 1 suppository rectally at bedtime.    [provider]  ?Multiple Vitamin (MULTIVITAMIN WITH MINERALS) TABS tablet Take 1 tablet by mouth daily. ?Patient not taking: Reported on 05/26/2021 12/06/18   Danae Orleans, PA-C  ?ondansetron (ZOFRAN-ODT) 4 MG disintegrating tablet Take 4 mg by mouth every 8 (eight) hours.  05/22/21   [provider]  ?pantoprazole (PROTONIX) 40 MG tablet Take 1 tablet (40 mg total) by mouth daily. 09/08/19   Orson Eva, MD  ?rifaximin (XIFAXAN) 550 MG TABS tablet Take 1 tablet (550 mg total) by mouth 2 (two) times daily. 05/27/21   Barton Dubois, MD  ?sitaGLIPtin-metformin (JANUMET) 50-500 MG per tablet Take 1 tablet by mouth 2 (two) times daily with a meal.    [provider]  ?spironolactone (ALDACTONE) 100 MG tablet Take 0.5 tablets (50 mg total) by mouth in the morning. ?Patient taking differently: Take 100 mg by mouth daily. 04/11/21   Johnson, Clanford L,  MD  ?traZODone (DESYREL) 50 MG tablet Take 50 mg by mouth at bedtime as needed for sleep.  05/17/19   [provider]  ?Ibuprofen-Diphenhydramine Cit (ADVIL PM PO) Take 60 mg by mouth at bedtime.  05/21/17  [provider]  ?   ? ?Allergies    ?Asa [aspirin] and Other   ? ?Review of Systems   ?Review of Systems  ?Constitutional:   ?     Per HPI, otherwise negative  ?HENT:    ?     Per HPI, otherwise negative  ?Respiratory:    ?     Per HPI, otherwise negative  ?Cardiovascular:   ?     Per HPI, otherwise negative  ?Gastrointestinal:  Positive for abdominal pain, nausea and vomiting.  ?Endocrine:  ?     Negative aside from HPI  ?Genitourinary:   ?     Neg aside from HPI   ?Musculoskeletal:   ?     Per HPI, otherwise negative  ?Skin: Negative.   ?Neurological:  Positive for weakness. Negative for syncope.  ? ?Physical Exam ?Updated Vital Signs ?BP (!) 103/51   Pulse 66   Temp 98.2 ?F (36.8 ?C) (Oral)   Resp 15   Ht 5' 2"  (1.575 m)   Wt 50.6 kg   SpO2 100%   BMI 20.40 kg/m?  ?Physical Exam ?Vitals and nursing note reviewed.  ?Constitutional:   ?   General: She is not in acute distress. ?   Appearance: She is well-developed. She is ill-appearing.  ?HENT:  ?   Head: Normocephalic and atraumatic.  ?Eyes:  ?   Conjunctiva/sclera: Conjunctivae normal.  ?Cardiovascular:  ?   Rate and Rhythm: Normal rate and regular rhythm.  ?Pulmonary:  ?   Effort: Pulmonary effort is normal. No respiratory distress.  ?   Breath sounds: Normal breath sounds. No stridor.  ?Abdominal:  ?   General: There is no distension.  ?   Tenderness: There is abdominal tenderness.  ?Skin: ?   General: Skin is warm and dry.  ?   Coloration: Skin is pale.  ?Neurological:  ?   Mental Status: She is alert and oriented to person, place, and time.  ?   Cranial Nerves: No cranial nerve deficit.  ?   Motor: Weakness and atrophy present.  ?   Comments: Face is symmetric, speech is brief, clear moves all extremities spontaneously  ?Psychiatric:      ?   Mood and Affect: Mood normal.  ?   Comments: Withdrawn, but speaks clearly  ? ? ?ED Results / Procedures / Treatments   ?Labs ?(all labs ordered are listed, but only abnormal results are displayed) ?Labs Reviewed  ?COMPREHENSIVE METABOLIC PANEL - Abnormal; Notable for the following components:  ?    Result Value  ? Sodium 125 (*)   ? CO2 11 (*)   ?  Glucose, Bld 102 (*)   ? BUN 32 (*)   ? Creatinine, Ser 1.64 (*)   ? Calcium 8.2 (*)   ? Total Protein 5.5 (*)   ? Albumin 2.3 (*)   ? AST 79 (*)   ? ALT 57 (*)   ? Total Bilirubin 1.6 (*)   ? GFR, Estimated 33 (*)   ? All other components within normal limits  ?CBC WITH DIFFERENTIAL/PLATELET - Abnormal; Notable for the following components:  ? RBC 2.98 (*)   ? Hemoglobin 9.7 (*)   ? HCT 29.9 (*)   ? MCV 100.3 (*)   ? RDW 17.6 (*)   ? Platelets 79 (*)   ? All other components within normal limits  ?LACTIC ACID, PLASMA - Abnormal; Notable for the following components:  ? Lactic Acid, Venous 5.2 (*)   ? All other components within normal limits  ?AMMONIA  ?ETHANOL  ?LIPASE, BLOOD  ?URINALYSIS, ROUTINE W REFLEX MICROSCOPIC  ?LACTIC ACID, PLASMA  ? ? ?EKG ?None ? ?Radiology ?No results found. ? ?Procedures ?Procedures  ? ? ?Medications Ordered in ED ?Medications  ?sodium chloride 0.9 % bolus 1,000 mL (1,000 mLs Intravenous New Bag/Given 06/05/21 0020)  ? ? ?ED Course/ Medical Decision Making/ A&P ?This patient presents to the ED for concern of abdominal pain, nausea, vomiting after initial chief complaint was lack of urinary production.  Concern for urinary retention was addressed with initial Foley catheter placement, but as*became more clear, and broader differential including obstruction, infection, peritonitis considered., this involves an extensive number of treatment options, and is a complaint that carries with it a high risk of complications and morbidity.  The differential diagnosis includes obstruction, progression of disease, ascites, urinary tract  disease ? ? ?Co morbidities that complicate the patient evaluation ? ?Obesity, liver dysfunction ? ?Social Determinants of Health: ? ?Age ? ?Additional history obtained: ? ?Additional history and/or information obtain

## 2021-06-05 NOTE — Assessment & Plan Note (Addendum)
-   The patient will be hydrated with IV normal saline. ?- We will place on IV bicarbonate drip for 1 L. ?- This is likely secondary to dehydration and is improving. ?- The patient does not meet criteria for sepsis. ?

## 2021-06-05 NOTE — Progress Notes (Signed)
Per Palliative Care NP, Brittany Russo., referral made to [facility] for hospice services at patient's home.  ? ? ?Brittany Russo D, LCSW  ?

## 2021-06-05 NOTE — Assessment & Plan Note (Addendum)
-   This is likely hypovolemic.  We will still obtain hyponatremia work-up. ?- The patient will be hydrated with IV normal saline and will follow sodium level. ?- We will hold off diuretics. ?

## 2021-06-06 DIAGNOSIS — R310 Gross hematuria: Secondary | ICD-10-CM | POA: Diagnosis not present

## 2021-06-06 DIAGNOSIS — R339 Retention of urine, unspecified: Secondary | ICD-10-CM | POA: Diagnosis not present

## 2021-06-06 DIAGNOSIS — N390 Urinary tract infection, site not specified: Secondary | ICD-10-CM | POA: Diagnosis not present

## 2021-06-06 LAB — COMPREHENSIVE METABOLIC PANEL
ALT: 29 U/L (ref 0–44)
AST: 44 U/L — ABNORMAL HIGH (ref 15–41)
Albumin: 3.7 g/dL (ref 3.5–5.0)
Alkaline Phosphatase: 50 U/L (ref 38–126)
Anion gap: 11 (ref 5–15)
BUN: 27 mg/dL — ABNORMAL HIGH (ref 8–23)
CO2: 21 mmol/L — ABNORMAL LOW (ref 22–32)
Calcium: 7.3 mg/dL — ABNORMAL LOW (ref 8.9–10.3)
Chloride: 95 mmol/L — ABNORMAL LOW (ref 98–111)
Creatinine, Ser: 1.74 mg/dL — ABNORMAL HIGH (ref 0.44–1.00)
GFR, Estimated: 31 mL/min — ABNORMAL LOW (ref >60)
Glucose, Bld: 108 mg/dL — ABNORMAL HIGH (ref 70–99)
Potassium: 3.1 mmol/L — ABNORMAL LOW (ref 3.5–5.1)
Sodium: 127 mmol/L — ABNORMAL LOW (ref 135–145)
Total Bilirubin: 1 mg/dL (ref 0.3–1.2)
Total Protein: 5.4 g/dL — ABNORMAL LOW (ref 6.5–8.1)

## 2021-06-06 LAB — CBC
HCT: 21.5 % — ABNORMAL LOW (ref 36.0–46.0)
Hemoglobin: 7.2 g/dL — ABNORMAL LOW (ref 12.0–15.0)
MCH: 32.4 pg (ref 26.0–34.0)
MCHC: 33.5 g/dL (ref 30.0–36.0)
MCV: 96.8 fL (ref 80.0–100.0)
Platelets: 29 10*3/uL — CL (ref 150–400)
RBC: 2.22 MIL/uL — ABNORMAL LOW (ref 3.87–5.11)
RDW: 17.3 % — ABNORMAL HIGH (ref 11.5–15.5)
WBC: 1.5 10*3/uL — ABNORMAL LOW (ref 4.0–10.5)
nRBC: 0 % (ref 0.0–0.2)

## 2021-06-06 LAB — GLUCOSE, CAPILLARY
Glucose-Capillary: 104 mg/dL — ABNORMAL HIGH (ref 70–99)
Glucose-Capillary: 139 mg/dL — ABNORMAL HIGH (ref 70–99)
Glucose-Capillary: 185 mg/dL — ABNORMAL HIGH (ref 70–99)
Glucose-Capillary: 189 mg/dL — ABNORMAL HIGH (ref 70–99)

## 2021-06-06 LAB — HEMOGLOBIN A1C
Hgb A1c MFr Bld: 5 % (ref 4.8–5.6)
Mean Plasma Glucose: 97 mg/dL

## 2021-06-06 LAB — LACTIC ACID, PLASMA: Lactic Acid, Venous: 2.3 mmol/L (ref 0.5–1.9)

## 2021-06-06 LAB — AMMONIA: Ammonia: 51 umol/L — ABNORMAL HIGH (ref 9–35)

## 2021-06-06 LAB — PROTIME-INR
INR: 1.9 — ABNORMAL HIGH (ref 0.8–1.2)
Prothrombin Time: 22.2 seconds — ABNORMAL HIGH (ref 11.4–15.2)

## 2021-06-06 LAB — MAGNESIUM: Magnesium: 1.8 mg/dL (ref 1.7–2.4)

## 2021-06-06 LAB — PREPARE RBC (CROSSMATCH)

## 2021-06-06 MED ORDER — POTASSIUM CHLORIDE CRYS ER 20 MEQ PO TBCR
40.0000 meq | EXTENDED_RELEASE_TABLET | Freq: Two times a day (BID) | ORAL | Status: AC
  Administered 2021-06-06 (×2): 40 meq via ORAL
  Filled 2021-06-06 (×2): qty 2

## 2021-06-06 MED ORDER — VITAMIN K1 10 MG/ML IJ SOLN
5.0000 mg | Freq: Once | INTRAMUSCULAR | Status: AC
  Administered 2021-06-06: 5 mg via INTRAVENOUS
  Filled 2021-06-06: qty 0.5

## 2021-06-06 MED ORDER — CIPROFLOXACIN IN D5W 400 MG/200ML IV SOLN
400.0000 mg | Freq: Two times a day (BID) | INTRAVENOUS | Status: DC
  Administered 2021-06-06 – 2021-06-07 (×2): 400 mg via INTRAVENOUS
  Filled 2021-06-06 (×2): qty 200

## 2021-06-06 MED ORDER — ALBUMIN HUMAN 25 % IV SOLN
50.0000 g | Freq: Four times a day (QID) | INTRAVENOUS | Status: AC
  Administered 2021-06-06 – 2021-06-07 (×4): 50 g via INTRAVENOUS
  Filled 2021-06-06 (×4): qty 200

## 2021-06-06 MED ORDER — SODIUM CHLORIDE 0.9% IV SOLUTION: Freq: Once | INTRAVENOUS | Status: DC

## 2021-06-06 MED ORDER — VITAMIN K1 10 MG/ML IJ SOLN: 1.0000 mg | Freq: Once | INTRAVENOUS | Status: DC

## 2021-06-06 NOTE — Progress Notes (Signed)
Phone consent obtained for blood transfusion.  ? ?Daughter updated by this nurse and Deanna LPN.  ?

## 2021-06-06 NOTE — Progress Notes (Signed)
Patient lactic acid is 2.3, critical result paged to Dr. Manuella Ghazi. ?

## 2021-06-06 NOTE — Progress Notes (Signed)
Urine sent for culture, very bloody. Patient attempted to eat, but only took a few bites. No complaints of pain. Answers questions appropriately but drowsy. ?

## 2021-06-06 NOTE — Hospital Course (Addendum)
Per HPI: ?Brittany Russo is a 76 y.o. Caucasian female with medical history significant for asthma, liver cirrhosis, type II obese mellitus, depression, hypertension, dyslipidemia, anxiety and anasarca, who presented to the emergency room with acute onset of urinary retention.  The patient's been having difficulty urinating for the last few hours before presentation to the ER.  She vomited once during the day.  She admitted to abdominal distention.  She stated that she has been compliant with her medications.  No fever or chills.  No chest pain or dyspnea or cough or wheezing.  She did not have any urine output yesterday.  She was noted to have bloody urine in the ER. ?  ?06/05/21: Patient was admitted for UTI with recent discharge for same on 3/9.  She was previously noted to have seizure activity due to benzodiazepine withdrawal, but benzodiazepines have been reinitiated here.  She is also noted to be hyponatremic and was initially started on IV normal saline which will be discontinued given her increased volume status.  Plan to push albumin to help redistribute fluids and maintain on bicarbonate as ordered.  Monitor repeat labs.  Lactic acidosis decreasing.  Follow urine cultures. ? ?06/06/21: Patient continues to have some persistent hematuria with worsening acute blood loss anemia which PRBC transfusion has been ordered.  She is also noted to have significant thrombocytopenia for which platelets have been ordered on account of her active bleeding status.  Vitamin K ordered for elevated INR.  She is otherwise asymptomatic and family members are at bedside.  Urology consulted for further evaluation of hematuria related to traumatic Foley insertion. ? ?3/18.  Received 1 more unit PRBC, fluid bolus, albumin for worsening renal function and hypotension. ?Start rifaximin and lactulose for hepatic encephalopathy. ?Ordered a saline flush of bladder. ? ?06/08/21: Patient noted to start on norepinephrine for blood pressure  support overnight.  Hyponatremia currently worsening and she appears to be developing hepatorenal syndrome.  She currently remains lethargic and family members have visited at bedside and are agreeable to transition to comfort care. ?

## 2021-06-06 NOTE — Progress Notes (Signed)
Blood has finished, IV flushed Vitals obtained and charted. H&H post blood transfusion ordered. Pt is asleep and family member is still at bedside at this time.  ?

## 2021-06-06 NOTE — Progress Notes (Signed)
Critical plt result of 29 paged to Dr. Manuella Ghazi. ?

## 2021-06-06 NOTE — Progress Notes (Signed)
Blood transfusion started with [facility].  Pre vitals and post 15 min vitals completed. No complications within the first 15 minute of transfusion.  Family member is bedside. Pt is resting comfortably at this time.   ?

## 2021-06-06 NOTE — Care Management Important Message (Signed)
Important Message ? ?Patient Details  ?Name: Brittany Russo ?MRN: [id number] ?Date of Birth: [birth year] ? ? ?Medicare Important Message Given:  Yes (late entry) ? ? ? ? ?Tommy Medal ?06/06/2021, 3:34 PM ?

## 2021-06-06 NOTE — Progress Notes (Addendum)
?PROGRESS NOTE ? ? ? ?Brittany Russo  SWF:[id number] DOB: 08/02/1945 DOA: 06/04/2021 ?PCP: Rosalee Kaufman, PA-C ? ? ?Brief Narrative:  ?Per HPI: ?Brittany Russo is a 76 y.o. Caucasian female with medical history significant for asthma, liver cirrhosis, type II obese mellitus, depression, hypertension, dyslipidemia, anxiety and anasarca, who presented to the emergency room with acute onset of urinary retention.  The patient's been having difficulty urinating for the last few hours before presentation to the ER.  She vomited once during the day.  She admitted to abdominal distention.  She stated that she has been compliant with her medications.  No fever or chills.  No chest pain or dyspnea or cough or wheezing.  She did not have any urine output yesterday.  She was noted to have bloody urine in the ER. ?  ?06/05/21: Patient was admitted for UTI with recent discharge for same on 3/9.  She was previously noted to have seizure activity due to benzodiazepine withdrawal, but benzodiazepines have been reinitiated here.  She is also noted to be hyponatremic and was initially started on IV normal saline which will be discontinued given her increased volume status.  Plan to push albumin to help redistribute fluids and maintain on bicarbonate as ordered.  Monitor repeat labs.  Lactic acidosis decreasing.  Follow urine cultures. ? ?06/06/21: Patient continues to have some persistent hematuria with worsening acute blood loss anemia which PRBC transfusion has been ordered.  She is also noted to have significant thrombocytopenia for which platelets have been ordered on account of her active bleeding status.  Vitamin K ordered for elevated INR.  She is otherwise asymptomatic and family members are at bedside.  Urology consulted for further evaluation of hematuria related to traumatic Foley insertion.  ? ? ?Assessment & Plan: ?  ?Active Problems: ?  Acute lower UTI ?  Hyponatremia ?  Lactic acidosis ?  Anxiety and depression ?   Type 2 diabetes mellitus with peripheral neuropathy (HCC) ?  Essential hypertension ?  Alcoholic cirrhosis of liver with ascites ([place]) ? ?Assessment and Plan: ? ?Acute lower UTI ?- We will continue antibiotic therapy with IV Rocephin for total 3-day course, unfortunately urine culture not obtained on admission ?- We will continue hydration with IV normal saline. ?- The patient has associated urinary retention. ? ?Hematuria with acute blood loss anemia ?-More likely related to traumatic Foley catheter insertion and possibly to UTI as well ?-Transfused PRBC overnight and may require further transfusion for hemoglobin less than 7 ?-Appreciate urology evaluation and stabilization prior to discharge ? ?Thrombocytopenia ?-Related to cirrhosis and ongoing active bleed ?-Transfusion of 1 unit of platelets given active bleeding ?-Continue close monitoring ?  ?Hyponatremia-improving ?- This is likely hypovolemic.  We will still obtain hyponatremia work-up. ?- The patient will be hydrated with IV normal saline and will follow sodium level. ?- We will hold off diuretics. ? ?Hypokalemia ?-Replete and recheck in a.m. ?  ?Lactic acidosis ?- The patient will be hydrated with IV normal saline. ?- We will place on IV bicarbonate drip for 1 L. ?- This is likely secondary to dehydration and is improving. ?- The patient does not meet criteria for sepsis. ?  ?Anxiety and depression ?- We will continue Wellbutrin XL, Lexapro and Xanax. ?  ?Type 2 diabetes mellitus with peripheral neuropathy ([place]) ?- The patient will be placed on supplement coverage with NovoLog. ?- We will continue basal coverage. ?  ?Essential hypertension ?- We will hold atenolol given soft blood pressure  readings and monitor carefully ?  ?Alcoholic cirrhosis of liver with ascites ([place]) ?- We will continue lactulose and Xifaxan ?-Vitamin K administered 3/17 given elevated INR and ongoing bleeding ?-Not a candidate for paracentesis given hypotension at this point ?-Given  4 doses of albumin ?-Liver disease is end-stage and patient follows with hepatology service at [facility].  Patient and family members planning discharge to home with home hospice once stabilized. ? ?Right-sided pleural effusion ?-No significant dyspnea or hypoxemia noted ?-Avoid thoracentesis at this time given severe thrombocytopenia ? ?  ?DVT prophylaxis: SCDs ?Code Status: Full ?Family Communication: Spouse, daughters, and son at bedside 3/17 ?Disposition Plan:  ?Status is: Inpatient ?Remains inpatient appropriate because: Requires ongoing close monitoring and stabilization of hematuria prior to anticipated discharge to home with hospice. ? ? ?Skin Assessment: ? ?I have examined the patient?s skin and I agree with the wound assessment as performed by the wound care RN as outlined below: ? ?Pressure Injury 05/29/21 Buttocks Left;Upper;Medial Stage 1 -  Intact skin with non-blanchable redness of a localized area usually over a bony prominence. 4cm x 3cm (Active)  ?05/29/21 0830  ?Location: Buttocks  ?Location Orientation: Left;Upper;Medial  ?Staging: Stage 1 -  Intact skin with non-blanchable redness of a localized area usually over a bony prominence.  ?Wound Description (Comments): 4cm x 3cm  ?Present on Admission: No  ? ? ?Consultants:  ?Palliative care ?Urology ? ?Procedures:  ?See below ? ?Antimicrobials:  ?Anti-infectives (From admission, onward)  ? ? Start     Dose/Rate Route Frequency Ordered Stop  ? 06/05/21 2200  cefTRIAXone (ROCEPHIN) 1 g in sodium chloride 0.9 % 100 mL IVPB  Status:  Discontinued       ? 1 g ?200 mL/hr over 30 Minutes Intravenous Every 24 hours 06/05/21 0430 06/05/21 1016  ? 06/05/21 2200  cefTRIAXone (ROCEPHIN) 2 g in sodium chloride 0.9 % 100 mL IVPB       ? 2 g ?200 mL/hr over 30 Minutes Intravenous Every 24 hours 06/05/21 1016    ? 06/05/21 1000  rifaximin (XIFAXAN) tablet 550 mg       ? 550 mg Oral 2 times daily 06/05/21 0341    ? 06/05/21 0145  ciprofloxacin (CIPRO) IVPB 400 mg        ? 400 mg ?200 mL/hr over 60 Minutes Intravenous  Once 06/05/21 0141 06/05/21 0309  ? ?  ? ? ?Subjective: ?Patient seen and evaluated today with no new acute complaints or concerns.  She is noted to have ongoing bloody urine output in her Foley catheter. ? ?Objective: ?Vitals:  ? 06/06/21 1243 06/06/21 1245 06/06/21 1348 06/06/21 1409  ?BP: (!) 99/49 (!) 99/49 (!) 99/54 (!) 84/47  ?Pulse: 60 63 60 60  ?Resp: 19 19 18 18   ?Temp: 98.4 ?F (36.9 ?C) 98.1 ?F (36.7 ?C) 98.4 ?F (36.9 ?C) 98.4 ?F (36.9 ?C)  ?TempSrc:  Oral Oral   ?SpO2:   99%   ?Weight:      ?Height:      ? ? ?Intake/Output Summary (Last 24 hours) at 06/06/2021 1500 ?Last data filed at 06/06/2021 1409 ?Gross per 24 hour  ?Intake 2046.15 ml  ?Output --  ?Net 2046.15 ml  ? ?Filed Weights  ? 06/04/21 2119 06/05/21 1128  ?Weight: 50.6 kg 58.4 kg  ? ? ?Examination: ? ?General exam: Appears calm and comfortable  ?Respiratory system: Clear to auscultation. Respiratory effort normal. ?Cardiovascular system: S1 & S2 heard, RRR.  ?Gastrointestinal system: Abdomen is soft ?Central nervous system:  Alert and awake ?Extremities: No edema ?Skin: No significant lesions noted ?Psychiatry: Flat affect. ?Foley with bloody urine output. ? ? ? ?Data Reviewed: I have personally reviewed following labs and imaging studies ? ?CBC: ?Recent Labs  ?Lab 06/04/21 ?2219 06/05/21 ?0430 06/05/21 ?0758 06/05/21 ?1109 06/05/21 ?1745 06/05/21 ?2303 06/06/21 ?0809  ?WBC 5.6 4.6  --   --   --   --  1.5*  ?NEUTROABS 3.9  --   --   --   --   --   --   ?HGB 9.7* 9.0* 9.0* 8.7* 7.7* 6.4* 7.2*  ?HCT 29.9* 27.8* 27.9* 26.8* 24.1* 19.3* 21.5*  ?MCV 100.3* 101.8*  --   --   --   --  96.8  ?PLT 79* 61*  --   --   --   --  29*  ? ?Basic Metabolic Panel: ?Recent Labs  ?Lab 06/04/21 ?2219 06/05/21 ?0430 06/06/21 ?0809  ?NA 125* 124* 127*  ?K 4.3 4.3 3.1*  ?CL 101 103 95*  ?CO2 11* 11* 21*  ?GLUCOSE 102* 103* 108*  ?BUN 32* 30* 27*  ?CREATININE 1.64* 1.43* 1.74*  ?CALCIUM 8.2* 7.6* 7.3*  ?MG  --   --  1.8   ? ?GFR: ?Estimated Creatinine Clearance: 22.8 mL/min (A) (by C-G formula based on SCr of 1.74 mg/dL (H)). ?Liver Function Tests: ?Recent Labs  ?Lab 06/04/21 ?2219 06/06/21 ?0809  ?AST 79* 44*  ?ALT 57*

## 2021-06-06 NOTE — Consult Note (Signed)
Urology Consult  Referring physician: Dr. Sherryll Burger Reason for referral: Gross hematuria  Chief Complaint: Gross hematuria  History of Present Illness: Brittany Russo is a 75yo with a history of hepatic failure who was admitted 3/15 with urinary retention, abdominal pain and lactic acidosis. On presentation to the ER she had a foley placed a gross hematuria was noted. Her urine continues to be dark red since foley placement. She has a history of recurrent UTIs and per her daughter she has gotten 8-9 UTIs this year. She was diagnosed with a UTI during her hospitalization 1 week ago and was discharged on cipro. For the past 2 days prior to admission she had worsening urinary urgency, frequency and dysuria.  Urine microscopy from the ER yesterday shows many bacteria. She was started on rocephin. Her abdominal pain has improved since foley placement. Platelets 29. INR 1.9. No other associated symptoms. No exacerbating/alleviating events.   Past Medical History:  Diagnosis Date   Acute and subacute hepatic failure without coma    Allergy    Anasarca    Anxiety    Arthritis    Ascites 09/2018   no SBP on 850 cc tap 10/13/18   Asthma    allery induced   Cirrhosis of liver (HCC) ~ 2004   from NAFLD.  Dx ~ 2004.  Dr Pervis Hocking at [facility]/     Depression    Diabetes mellitus without complication [facility])    type 2   Eczema of both hands    Endometrial cancer (HCC) 1996, 2003   hysterectomy 1996.  recurrence 2003, treated with radiation.     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, DR Georgian Co Mir at [facility] in [place]   Hyperlipidemia    Hypertension    Muscle weakness    Seizures (HCC)    last seizure June 07, 2014    Thrombocytopenia [facility])    Thyroid nodule    Past Surgical History:  Procedure Laterality Date   AMPUTATION Right 06/29/2019   Procedure: AMPUTATION ABOVE KNEE through knee;  Surgeon: Durene Romans, MD;  Location: [facility]  ORS;  Service: Orthopedics;  Laterality: Right;  2 hrs   CATARACT EXTRACTION W/PHACO Left 06/04/2017   Procedure: CATARACT EXTRACTION PHACO AND INTRAOCULAR LENS PLACEMENT (IOC);  Surgeon: Fabio Pierce, MD;  Location: [facility] ORS;  Service: Ophthalmology;  Laterality: Left;  CDE: 3.90   CATARACT EXTRACTION W/PHACO Right 07/15/2017   Procedure: CATARACT EXTRACTION PHACO AND INTRAOCULAR LENS PLACEMENT (IOC);  Surgeon: Fabio Pierce, MD;  Location: [facility]S;  Service: Ophthalmology;  Laterality: Right;  CDE: 7.60   COLONOSCOPY  06/2007   in [place]. for hematochzia.  radiation proctitis   DILATION AND CURETTAGE OF UTERUS     ESOPHAGOGASTRODUODENOSCOPY  2016   ESOPHAGOGASTRODUODENOSCOPY (EGD) WITH PROPOFOL N/A 12/30/2018   Procedure: EGD;  Surgeon: Vida Rigger, MD;  [facility]: [facility];  Service: Endoscopy;  Laterality: N/A;   EXCISIONAL TOTAL KNEE ARTHROPLASTY Right 01/03/2019   Procedure: EXCISIONAL TOTAL KNEE ARTHROPLASTY;  Surgeon: Durene Romans, MD;  [facility];  Service: Orthopedics;  Laterality: Right;   FLEXIBLE SIGMOIDOSCOPY N/A 12/30/2018   Procedure: FLEXIBLE SIGMOIDOSCOPY;  Surgeon: Vida Rigger, MD;  [facility];  Service: Endoscopy;  Laterality: N/A;   IR PARACENTESIS  10/13/2018   IR PARACENTESIS  07/21/2019   IR THORACENTESIS ASP PLEURAL SPACE W/IMG GUIDE  07/19/2019   STERIOD INJECTION Left 06/29/2019   Procedure: LEFT KNEE INJECTION;  Surgeon: Durene Romans, MD;  [facility];  Service: Orthopedics;  Laterality: Left;   TOTAL ABDOMINAL HYSTERECTOMY  1996   TOTAL KNEE ARTHROPLASTY Right 09/22/2018   Procedure: TOTAL KNEE ARTHROPLASTY;  Surgeon: Durene Romans, MD;  [facility];  Service: Orthopedics;  Laterality: Right;  70 mins   TOTAL KNEE REVISION Right 09/27/2018   Procedure: TOTAL KNEE REVISION;  Surgeon: Durene Romans, MD;  [facility];  Service: Orthopedics;  Laterality: Right;   TOTAL KNEE REVISION Right 11/22/2018   Procedure: repair right knee extensor  mechanism;  Surgeon: Durene Romans, MD;  Location[facility];  Service: Orthopedics;  Laterality: Right;  90 mins    Medications: I have reviewed the patient's current medications. Allergies:  Allergies  Allergen Reactions   Asa [Aspirin] Other (See Comments)    Pt not allergic but cannot take due to bleeding    Other Other (See Comments)    Any jewelry metal-hives and itching     Family History  Problem Relation Age of Onset   Heart disease Mother    Diabetes Mother    Heart disease Father    Cancer Father    Cancer Maternal Grandmother    Heart disease Maternal Grandfather    Social History:  reports that she has never smoked. She has never used smokeless tobacco. She reports that she does not drink alcohol and does not use drugs.  Review of Systems  Genitourinary:  Positive for difficulty urinating, frequency and hematuria.   Physical Exam:  Vital signs in last 24 hours: Temp:  [97.6 F (36.4 C)-98.5 F (36.9 C)] 98.4 F (36.9 C) (03/17 1409) Pulse Rate:  [60-69] 60 (03/17 1409) Resp:  [14-20] 18 (03/17 1409) BP: (84-110)/(47-60) 84/47 (03/17 1409) SpO2:  [97 %-100 %] 99 % (03/17 1348) Physical Exam Vitals reviewed.  Constitutional:      Appearance: Normal appearance.  HENT:     Head: Normocephalic and atraumatic.     Mouth/Throat:     Mouth: Mucous membranes are dry.  Eyes:     Extraocular Movements: Extraocular movements intact.     Pupils: Pupils are equal, round, and reactive to light.  Cardiovascular:     Rate and Rhythm: Normal rate and regular rhythm.  Pulmonary:     Effort: Pulmonary effort is normal. No respiratory distress.  Abdominal:     General: Abdomen is flat. There is no distension.  Musculoskeletal:     Cervical back: Normal range of motion. No rigidity.     Right Lower Extremity: Right leg is amputated above knee.  Skin:    General: Skin is warm and dry.  Neurological:     General: No focal deficit present.     Mental Status: She is  alert. Mental status is at baseline.  Psychiatric:        Mood and Affect: Mood normal.        Behavior: Behavior normal.    Laboratory Data:  Results for orders placed or performed during the hospital encounter of 06/04/21 (from the past 72 hour(s))  Comprehensive metabolic panel     Status: Abnormal   Collection Time: 06/04/21 10:19 PM  Result Value Ref Range   Sodium 125 (L) 135 - 145 mmol/L   Potassium 4.3 3.5 - 5.1 mmol/L   Chloride 101 98 - 111 mmol/L   CO2 11 (L) 22 - 32 mmol/L   Glucose, Bld 102 (H) 70 - 99 mg/dL    Comment: Glucose reference range applies only to  samples taken after fasting for at least 8 hours.   BUN 32 (H) 8 - 23 mg/dL   Creatinine, Ser 0.98 (H) 0.44 - 1.00 mg/dL   Calcium 8.2 (L) 8.9 - 10.3 mg/dL   Total Protein 5.5 (L) 6.5 - 8.1 g/dL   Albumin 2.3 (L) 3.5 - 5.0 g/dL   AST 79 (H) 15 - 41 U/L   ALT 57 (H) 0 - 44 U/L   Alkaline Phosphatase 104 38 - 126 U/L   Total Bilirubin 1.6 (H) 0.3 - 1.2 mg/dL   GFR, Estimated 33 (L) >60 mL/min    Comment: (NOTE) Calculated using the CKD-EPI Creatinine Equation (2021)    Anion gap 13 5 - 15    Comment: Performed at [facility], [address] [place], [place] [address]  CBC with Differential     Status: Abnormal   Collection Time: 06/04/21 10:19 PM  Result Value Ref Range   WBC 5.6 4.0 - 10.5 K/uL   RBC 2.98 (L) 3.87 - 5.11 MIL/uL   Hemoglobin 9.7 (L) 12.0 - 15.0 g/dL   HCT 78.2 (L) 95.6 - 21.3 %   MCV 100.3 (H) 80.0 - 100.0 fL   MCH 32.6 26.0 - 34.0 pg   MCHC 32.4 30.0 - 36.0 g/dL   RDW 08.6 (H) 57.8 - 46.9 %   Platelets 79 (L) 150 - 400 K/uL    Comment: SPECIMEN CHECKED FOR CLOTS Immature Platelet Fraction may be clinically indicated, consider ordering this additional test [id number] PLATELET COUNT CONFIRMED BY SMEAR    nRBC 0.0 0.0 - 0.2 %   Neutrophils Relative % 70 %   Neutro Abs 3.9 1.7 - 7.7 K/uL   Lymphocytes Relative 12 %   Lymphs Abs 0.7 0.7 - 4.0 K/uL   Monocytes Relative 11 %    Monocytes Absolute 0.6 0.1 - 1.0 K/uL   Eosinophils Relative 5 %   Eosinophils Absolute 0.3 0.0 - 0.5 K/uL   Basophils Relative 1 %   Basophils Absolute 0.0 0.0 - 0.1 K/uL   WBC Morphology TOXIC GRANULATION    RBC Morphology MORPHOLOGY UNREMARKABLE    Smear Review MORPHOLOGY UNREMARKABLE    Immature Granulocytes 1 %   Abs Immature Granulocytes 0.06 0.00 - 0.07 K/uL   Polychromasia PRESENT     Comment: Performed at [facility], [address], [place], [place] [address]  Ammonia     Status: None   Collection Time: 06/04/21 10:19 PM  Result Value Ref Range   Ammonia 35 9 - 35 umol/L    Comment: Performed at [facility], [address], [place], [place] [address]  Ethanol     Status: None   Collection Time: 06/04/21 10:19 PM  Result Value Ref Range   Alcohol, Ethyl (B) <10 <10 mg/dL    Comment: (NOTE) Lowest detectable limit for serum alcohol is 10 mg/dL.  For medical purposes only. Performed at [facility], [address] [place], [place] [address]   Lactic acid, plasma     Status: Abnormal   Collection Time: 06/04/21 10:19 PM  Result Value Ref Range   Lactic Acid, Venous 5.2 (HH) 0.5 - 1.9 mmol/L    Comment: CRITICAL RESULT CALLED TO, READ BACK BY AND VERIFIED WITH: K BELTON ON 03.15.23 AT 2334 BY ADGER J  Performed at [facility], [address], [place], [place] [address]   Lipase, blood     Status: None   Collection Time: 06/04/21 10:19 PM  Result Value Ref Range  Lipase 49 11 - 51 U/L    Comment: Performed at [facility], [address], [place], [place] [address]  Urinalysis, Routine w reflex microscopic Urine, Catheterized     Status: Abnormal   Collection Time: 06/04/21 11:00 PM  Result Value Ref Range   Color, Urine YELLOW YELLOW   APPearance TURBID (A) CLEAR   Specific Gravity, Urine 1.013 1.005 - 1.030   pH 6.0 5.0 - 8.0   Glucose, UA NEGATIVE NEGATIVE mg/dL   Hgb urine dipstick LARGE (A) NEGATIVE   Bilirubin Urine NEGATIVE NEGATIVE   Ketones, ur NEGATIVE  NEGATIVE mg/dL   Protein, ur 604 (A) NEGATIVE mg/dL   Nitrite NEGATIVE NEGATIVE   Leukocytes,Ua MODERATE (A) NEGATIVE   RBC / HPF >50 (H) 0 - 5 RBC/hpf   WBC, UA >50 (H) 0 - 5 WBC/hpf   Bacteria, UA MANY (A) NONE SEEN   WBC Clumps PRESENT    Hyaline Casts, UA PRESENT     Comment: Performed at [facility], [address] [place], [place] [address]  Lactic acid, plasma     Status: Abnormal   Collection Time: 06/05/21 12:23 AM  Result Value Ref Range   Lactic Acid, Venous 4.7 (HH) 0.5 - 1.9 mmol/L    Comment: CRITICAL RESULT CALLED TO, READ BACK BY AND VERIFIED WITH: K BELTON ON 03.15.23 AT 0045 BY ADGER J Performed at [facility], [address], [place], [place] [address]   Hemoglobin A1c     Status: None   Collection Time: 06/05/21  4:30 AM  Result Value Ref Range   Hgb A1c MFr Bld 5.0 4.8 - 5.6 %    Comment: (NOTE)         Prediabetes: 5.7 - 6.4         Diabetes: >6.4         Glycemic control for adults with diabetes: <7.0    Mean Plasma Glucose 97 mg/dL    Comment: (NOTE) Performed At: [facility] [address] [place], [place] [id number] Jolene Schimke MD [id number]   Basic metabolic panel     Status: Abnormal   Collection Time: 06/05/21  4:30 AM  Result Value Ref Range   Sodium 124 (L) 135 - 145 mmol/L   Potassium 4.3 3.5 - 5.1 mmol/L   Chloride 103 98 - 111 mmol/L   CO2 11 (L) 22 - 32 mmol/L   Glucose, Bld 103 (H) 70 - 99 mg/dL    Comment: Glucose reference range applies only to samples taken after fasting for at least 8 hours.   BUN 30 (H) 8 - 23 mg/dL   Creatinine, Ser 5.28 (H) 0.44 - 1.00 mg/dL   Calcium 7.6 (L) 8.9 - 10.3 mg/dL   GFR, Estimated 39 (L) >60 mL/min    Comment: (NOTE) Calculated using the CKD-EPI Creatinine Equation (2021)    Anion gap 10 5 - 15    Comment: Performed at [facility], [address] [place], [place] [address]  CBC     Status: Abnormal   Collection Time: 06/05/21  4:30 AM  Result Value Ref Range   WBC 4.6 4.0 -  10.5 K/uL   RBC 2.73 (L) 3.87 - 5.11 MIL/uL   Hemoglobin 9.0 (L) 12.0 - 15.0 g/dL   HCT 40.1 (L) 02.7 - 25.3 %   MCV 101.8 (H) 80.0 - 100.0 fL   MCH 33.0 26.0 - 34.0 pg   MCHC 32.4 30.0 - 36.0 g/dL   RDW 66.4 (H) 40.3 - 47.4 %  Platelets 61 (L) 150 - 400 K/uL    Comment: SPECIMEN CHECKED FOR CLOTS Immature Platelet Fraction may be clinically indicated, consider ordering this additional test [id number] CONSISTENT WITH PREVIOUS RESULT    nRBC 0.0 0.0 - 0.2 %    Comment: Performed at [facility], [address], [place], [place] [address]  TSH     Status: Abnormal   Collection Time: 06/05/21  4:30 AM  Result Value Ref Range   TSH 5.056 (H) 0.350 - 4.500 uIU/mL    Comment: Performed by a 3rd Generation assay with a functional sensitivity of <=0.01 uIU/mL. Performed at [facility], [address] [place], [place] [address]   Resp Panel by RT-PCR (Flu A&B, Covid) Nasopharyngeal Swab     Status: None   Collection Time: 06/05/21  4:36 AM   Specimen: Nasopharyngeal Swab; Nasopharyngeal(NP) swabs in vial transport medium  Result Value Ref Range   SARS Coronavirus 2 by RT PCR NEGATIVE NEGATIVE    Comment: (NOTE) SARS-CoV-2 target nucleic acids are NOT DETECTED.  The SARS-CoV-2 RNA is generally detectable in upper respiratory specimens during the acute phase of infection. The lowest concentration of SARS-CoV-2 viral copies this assay can detect is 138 copies/mL. A negative result does not preclude SARS-Cov-2 infection and should not be used as the sole basis for treatment or other patient management decisions. A negative result may occur with  improper specimen collection/handling, submission of specimen other than nasopharyngeal swab, presence of viral mutation(s) within the areas targeted by this assay, and inadequate number of viral copies(<138 copies/mL). A negative result must be combined with clinical observations, patient history, and epidemiological information. The expected  result is Negative.  Fact Sheet for Patients:  BloggerCourse.com  Fact Sheet for Healthcare Providers:  SeriousBroker.it  This test is no t yet approved or cleared by the [place] FDA and  has been authorized for detection and/or diagnosis of SARS-CoV-2 by FDA under an Emergency Use Authorization (EUA). This EUA will remain  in effect (meaning this test can be used) for the duration of the COVID-19 declaration under Section 564(b)(1) of the Act, 21 U.S.C.section 360bbb-3(b)(1), unless the authorization is terminated  or revoked sooner.       Influenza A by PCR NEGATIVE NEGATIVE   Influenza B by PCR NEGATIVE NEGATIVE    Comment: (NOTE) The Xpert Xpress SARS-CoV-2/FLU/RSV plus assay is intended as an aid in the diagnosis of influenza from Nasopharyngeal swab specimens and should not be used as a sole basis for treatment. Nasal washings and aspirates are unacceptable for Xpert Xpress SARS-CoV-2/FLU/RSV testing.  Fact Sheet for Patients: BloggerCourse.com  Fact Sheet for Healthcare Providers: SeriousBroker.it  This test is not yet approved or cleared by the [place] FDA and has been authorized for detection and/or diagnosis of SARS-CoV-2 by FDA under an Emergency Use Authorization (EUA). This EUA will remain in effect (meaning this test can be used) for the duration of the COVID-19 declaration under Section 564(b)(1) of the Act, 21 U.S.C. section 360bbb-3(b)(1), unless the authorization is terminated or revoked.  Performed at [facility], [address], [place], [place] [address]   Na and K (sodium & potassium), rand urine     Status: None   Collection Time: 06/05/21  5:24 AM  Result Value Ref Range   Sodium, Ur 36 mmol/L   Potassium Urine 12 mmol/L    Comment: Performed at [facility], [address], [place], [place] [address]  Osmolality, urine     Status: None  Collection Time: 06/05/21  5:25 AM  Result Value Ref Range   Osmolality, Ur 300 300 - 900 mOsm/kg    Comment: Performed at [facility], [address], [place], [place] [address]  Osmolality     Status: None   Collection Time: 06/05/21  7:58 AM  Result Value Ref Range   Osmolality 277 275 - 295 mOsm/kg    Comment: Performed at [facility], [address], [place], [place] [address]  Hemoglobin and hematocrit, blood     Status: Abnormal   Collection Time: 06/05/21  7:58 AM  Result Value Ref Range   Hemoglobin 9.0 (L) 12.0 - 15.0 g/dL   HCT 09.8 (L) 11.9 - 14.7 %    Comment: Performed at [facility], [address] [place], [place] [address]  Lactic acid, plasma     Status: Abnormal   Collection Time: 06/05/21  7:59 AM  Result Value Ref Range   Lactic Acid, Venous 3.4 (HH) 0.5 - 1.9 mmol/L    Comment: CRITICAL RESULT CALLED TO, READ BACK BY AND VERIFIED WITH: KING @ 0842 ON [id number] BY HENDERSON L Performed at [facility], [address], [place], [place] [address]   CBG monitoring, ED     Status: Abnormal   Collection Time: 06/05/21  8:30 AM  Result Value Ref Range   Glucose-Capillary 129 (H) 70 - 99 mg/dL    Comment: Glucose reference range applies only to samples taken after fasting for at least 8 hours.  Hemoglobin and hematocrit, blood     Status: Abnormal   Collection Time: 06/05/21 11:09 AM  Result Value Ref Range   Hemoglobin 8.7 (L) 12.0 - 15.0 g/dL   HCT 96.2 (L) 95.2 - 84.1 %    Comment: Performed at [facility], [address], [place], [place] [address]  Lactic acid, plasma     Status: Abnormal   Collection Time: 06/05/21 11:09 AM  Result Value Ref Range   Lactic Acid, Venous 3.2 (HH) 0.5 - 1.9 mmol/L    Comment: CRITICAL VALUE NOTED.  VALUE IS CONSISTENT WITH PREVIOUSLY REPORTED AND CALLED VALUE. Performed at [facility], [address], [place], [place] [address]   T4, free     Status: None   Collection Time: 06/05/21 11:09 AM  Result Value Ref  Range   Free T4 1.07 0.61 - 1.12 ng/dL    Comment: (NOTE) Biotin ingestion may interfere with free T4 tests. If the results are inconsistent with the TSH level, previous test results, or the clinical presentation, then consider biotin interference. If needed, order repeat testing after stopping biotin. Performed at [facility], [address], [place], [place] [address]   Glucose, capillary     Status: Abnormal   Collection Time: 06/05/21 12:03 PM  Result Value Ref Range   Glucose-Capillary 121 (H) 70 - 99 mg/dL    Comment: Glucose reference range applies only to samples taken after fasting for at least 8 hours.   Comment 1 Notify RN    Comment 2 Document in Chart   Glucose, capillary     Status: Abnormal   Collection Time: 06/05/21  4:11 PM  Result Value Ref Range   Glucose-Capillary 186 (H) 70 - 99 mg/dL    Comment: Glucose reference range applies only to samples taken after fasting for at least 8 hours.  Hemoglobin and hematocrit, blood     Status: Abnormal   Collection Time: 06/05/21  5:45 PM  Result Value Ref  Range   Hemoglobin 7.7 (L) 12.0 - 15.0 g/dL   HCT 10.2 (L) 72.5 - 36.6 %    Comment: Performed at [facility], [address] [place], [place] [address]  Protime-INR     Status: Abnormal   Collection Time: 06/05/21  5:45 PM  Result Value Ref Range   Prothrombin Time 18.1 (H) 11.4 - 15.2 seconds   INR 1.5 (H) 0.8 - 1.2    Comment: (NOTE) INR goal varies based on device and disease states. Performed at [facility], [address] [place], [place] [address]   Glucose, capillary     Status: Abnormal   Collection Time: 06/05/21  9:28 PM  Result Value Ref Range   Glucose-Capillary 179 (H) 70 - 99 mg/dL    Comment: Glucose reference range applies only to samples taken after fasting for at least 8 hours.  Hemoglobin and hematocrit, blood     Status: Abnormal   Collection Time: 06/05/21 11:03 PM  Result Value Ref Range   Hemoglobin 6.4 (LL) 12.0 - 15.0 g/dL     Comment: This critical result has verified and been called to [facility] by Corliss Skains on 03 16 2023 at 2337, and has been read back.    HCT 19.3 (L) 36.0 - 46.0 %    Comment: Performed at [facility], [address] [place], [place] [address]  Type and screen [facility]     Status: None (Preliminary result)   Collection Time: 06/05/21 11:59 PM  Result Value Ref Range   ABO/RH(D) A POS    Antibody Screen NEG    Sample Expiration 06/08/2021,2359    Unit Number [id number]    Blood Component Type RED CELLS,LR    Unit division 00    Status of Unit ISSUED    Transfusion Status OK TO TRANSFUSE    Crossmatch Result      Compatible Performed at [facility], [address] [place], [place] [address]   Prepare RBC (crossmatch)     Status: None   Collection Time: 06/05/21 11:59 PM  Result Value Ref Range   Order Confirmation      ORDER PROCESSED BY BLOOD BANK Performed at [facility], [address], [place], [place] [address]   Prepare platelet pheresis     Status: None (Preliminary result)   Collection Time: 06/05/21 11:59 PM  Result Value Ref Range   Unit Number [id number]    Blood Component Type PLTP2 PSORALEN TREATED    Unit division 00    Status of Unit ISSUED    Transfusion Status      OK TO TRANSFUSE Performed at [facility], [address] [place], [place] [address]   Glucose, capillary     Status: Abnormal   Collection Time: 06/06/21  7:37 AM  Result Value Ref Range   Glucose-Capillary 104 (H) 70 - 99 mg/dL    Comment: Glucose reference range applies only to samples taken after fasting for at least 8 hours.  CBC     Status: Abnormal   Collection Time: 06/06/21  8:09 AM  Result Value Ref Range   WBC 1.5 (L) 4.0 - 10.5 K/uL   RBC 2.22 (L) 3.87 - 5.11 MIL/uL   Hemoglobin 7.2 (L) 12.0 - 15.0 g/dL   HCT 62.3 (L) 76.2 - 83.1 %   MCV 96.8 80.0 - 100.0 fL   MCH 32.4 26.0 - 34.0 pg   MCHC 33.5 30.0 - 36.0 g/dL   RDW 51.7 (H) 61.6 - 07.3 %  Platelets  29 (LL) 150 - 400 K/uL    Comment: SPECIMEN CHECKED FOR CLOTS Immature Platelet Fraction may be clinically indicated, consider ordering this additional test [id number] CONSISTENT WITH PREVIOUS RESULT PLATELET COUNT CONFIRMED BY SMEAR    nRBC 0.0 0.0 - 0.2 %    Comment: Performed at [facility], [address], [place], [place] [address]  Comprehensive metabolic panel     Status: Abnormal   Collection Time: 06/06/21  8:09 AM  Result Value Ref Range   Sodium 127 (L) 135 - 145 mmol/L   Potassium 3.1 (L) 3.5 - 5.1 mmol/L    Comment: DELTA CHECK NOTED   Chloride 95 (L) 98 - 111 mmol/L   CO2 21 (L) 22 - 32 mmol/L   Glucose, Bld 108 (H) 70 - 99 mg/dL    Comment: Glucose reference range applies only to samples taken after fasting for at least 8 hours.   BUN 27 (H) 8 - 23 mg/dL   Creatinine, Ser 1.19 (H) 0.44 - 1.00 mg/dL   Calcium 7.3 (L) 8.9 - 10.3 mg/dL   Total Protein 5.4 (L) 6.5 - 8.1 g/dL   Albumin 3.7 3.5 - 5.0 g/dL   AST 44 (H) 15 - 41 U/L   ALT 29 0 - 44 U/L   Alkaline Phosphatase 50 38 - 126 U/L   Total Bilirubin 1.0 0.3 - 1.2 mg/dL   GFR, Estimated 31 (L) >60 mL/min    Comment: (NOTE) Calculated using the CKD-EPI Creatinine Equation (2021)    Anion gap 11 5 - 15    Comment: Performed at [facility], [address], [place], [place] [address]  Magnesium     Status: None   Collection Time: 06/06/21  8:09 AM  Result Value Ref Range   Magnesium 1.8 1.7 - 2.4 mg/dL    Comment: Performed at [facility], [address] [place], [place] [address]  Protime-INR     Status: Abnormal   Collection Time: 06/06/21  8:09 AM  Result Value Ref Range   Prothrombin Time 22.2 (H) 11.4 - 15.2 seconds   INR 1.9 (H) 0.8 - 1.2    Comment: (NOTE) INR goal varies based on device and disease states. Performed at [facility], [address], [place], [place] [address]   Lactic acid, plasma     Status: Abnormal   Collection Time: 06/06/21  8:10 AM  Result Value Ref Range   Lactic Acid, Venous  2.3 (HH) 0.5 - 1.9 mmol/L    Comment: CRITICAL RESULT CALLED TO, READ BACK BY AND VERIFIED WITH: BERNHARD @ 0933 ON [id number] BY HENDERSON L Performed at [facility], [address], [place], [place] [address]   Glucose, capillary     Status: Abnormal   Collection Time: 06/06/21 11:48 AM  Result Value Ref Range   Glucose-Capillary 139 (H) 70 - 99 mg/dL    Comment: Glucose reference range applies only to samples taken after fasting for at least 8 hours.  Glucose, capillary     Status: Abnormal   Collection Time: 06/06/21  4:53 PM  Result Value Ref Range   Glucose-Capillary 185 (H) 70 - 99 mg/dL    Comment: Glucose reference range applies only to samples taken after fasting for at least 8 hours.  Ammonia     Status: Abnormal   Collection Time: 06/06/21  5:18 PM  Result Value Ref Range   Ammonia 51 (H) 9 - 35 umol/L    Comment: Performed at [facility], [address], [place], [place] [address]   Recent Results (from the past 240 hour(s))  Resp Panel by RT-PCR (Flu A&B, Covid) Nasopharyngeal Swab     Status: None   Collection Time: 06/05/21  4:36 AM   Specimen: Nasopharyngeal Swab; Nasopharyngeal(NP) swabs in vial transport medium  Result Value Ref Range Status   SARS Coronavirus 2 by RT PCR NEGATIVE NEGATIVE Final    Comment: (NOTE) SARS-CoV-2 target nucleic acids are NOT DETECTED.  The SARS-CoV-2 RNA is generally detectable in upper respiratory specimens during the acute phase of infection. The lowest concentration of SARS-CoV-2 viral copies this assay can detect is 138 copies/mL. A negative result does not preclude SARS-Cov-2 infection and should not be used as the sole basis for treatment or other patient management decisions. A negative result may occur with  improper specimen collection/handling, submission of specimen other than nasopharyngeal swab, presence of viral mutation(s) within the areas targeted by this assay, and inadequate number of viral copies(<138 copies/mL). A  negative result must be combined with clinical observations, patient history, and epidemiological information. The expected result is Negative.  Fact Sheet for Patients:  BloggerCourse.com  Fact Sheet for Healthcare Providers:  SeriousBroker.it  This test is no t yet approved or cleared by the [place] FDA and  has been authorized for detection and/or diagnosis of SARS-CoV-2 by FDA under an Emergency Use Authorization (EUA). This EUA will remain  in effect (meaning this test can be used) for the duration of the COVID-19 declaration under Section 564(b)(1) of the Act, 21 U.S.C.section 360bbb-3(b)(1), unless the authorization is terminated  or revoked sooner.       Influenza A by PCR NEGATIVE NEGATIVE Final   Influenza B by PCR NEGATIVE NEGATIVE Final    Comment: (NOTE) The Xpert Xpress SARS-CoV-2/FLU/RSV plus assay is intended as an aid in the diagnosis of influenza from Nasopharyngeal swab specimens and should not be used as a sole basis for treatment. Nasal washings and aspirates are unacceptable for Xpert Xpress SARS-CoV-2/FLU/RSV testing.  Fact Sheet for Patients: BloggerCourse.com  Fact Sheet for Healthcare Providers: SeriousBroker.it  This test is not yet approved or cleared by the [place] FDA and has been authorized for detection and/or diagnosis of SARS-CoV-2 by FDA under an Emergency Use Authorization (EUA). This EUA will remain in effect (meaning this test can be used) for the duration of the COVID-19 declaration under Section 564(b)(1) of the Act, 21 U.S.C. section 360bbb-3(b)(1), unless the authorization is terminated or revoked.  Performed at [facility], [address], [place], [place] [address]    Creatinine: Recent Labs    06/04/21 2219 06/05/21 0430 06/06/21 0809  CREATININE 1.64* 1.43* 1.74*   Baseline Creatinine:  1.5  Impression/Assessment:  75yo with gross hematuria, UTI, and urinary retention  Plan:  I discussed the etiology of gross hematuria with the patient and family. Her gross hematuria is likely a combination of infectious cystitis, foley irritation, and thrombocytopenia. Please switch antibiotics from rocephin to ciprofloxacin since her last urine culture was sensitive to cipro. Please continue platelet transfusions with a goal of over 50.  Urinary retention: Her urinary retention is likely related to acute cystitis. Please continue indwelling foley until her urine clears and then she can have a voiding trial.  Wilkie Aye 06/06/2021, 6:25 PM

## 2021-06-06 NOTE — Progress Notes (Signed)
Patient's bp post albumin infusions improved. Documented. ?

## 2021-06-06 NOTE — Progress Notes (Signed)
Bloody urine in catheter bag noted upon assessment. MD notified. ?

## 2021-06-07 ENCOUNTER — Inpatient Hospital Stay (HOSPITAL_COMMUNITY): Payer: Medicare Other

## 2021-06-07 DIAGNOSIS — E871 Hypo-osmolality and hyponatremia: Secondary | ICD-10-CM | POA: Diagnosis not present

## 2021-06-07 DIAGNOSIS — K7031 Alcoholic cirrhosis of liver with ascites: Secondary | ICD-10-CM

## 2021-06-07 DIAGNOSIS — N390 Urinary tract infection, site not specified: Secondary | ICD-10-CM | POA: Diagnosis not present

## 2021-06-07 LAB — BASIC METABOLIC PANEL
Anion gap: 14 (ref 5–15)
BUN: 28 mg/dL — ABNORMAL HIGH (ref 8–23)
CO2: 18 mmol/L — ABNORMAL LOW (ref 22–32)
Calcium: 7.6 mg/dL — ABNORMAL LOW (ref 8.9–10.3)
Chloride: 93 mmol/L — ABNORMAL LOW (ref 98–111)
Creatinine, Ser: 2.29 mg/dL — ABNORMAL HIGH (ref 0.44–1.00)
GFR, Estimated: 22 mL/min — ABNORMAL LOW (ref >60)
Glucose, Bld: 134 mg/dL — ABNORMAL HIGH (ref 70–99)
Potassium: 3.8 mmol/L (ref 3.5–5.1)
Sodium: 125 mmol/L — ABNORMAL LOW (ref 135–145)

## 2021-06-07 LAB — CBC
HCT: 19.5 % — ABNORMAL LOW (ref 36.0–46.0)
Hemoglobin: 6.4 g/dL — CL (ref 12.0–15.0)
MCH: 31.8 pg (ref 26.0–34.0)
MCHC: 32.8 g/dL (ref 30.0–36.0)
MCV: 97 fL (ref 80.0–100.0)
Platelets: 38 10*3/uL — ABNORMAL LOW (ref 150–400)
RBC: 2.01 MIL/uL — ABNORMAL LOW (ref 3.87–5.11)
RDW: 17.6 % — ABNORMAL HIGH (ref 11.5–15.5)
WBC: 2.2 10*3/uL — ABNORMAL LOW (ref 4.0–10.5)
nRBC: 0 % (ref 0.0–0.2)

## 2021-06-07 LAB — MAGNESIUM: Magnesium: 1.8 mg/dL (ref 1.7–2.4)

## 2021-06-07 LAB — GLUCOSE, CAPILLARY
Glucose-Capillary: 155 mg/dL — ABNORMAL HIGH (ref 70–99)
Glucose-Capillary: 147 mg/dL — ABNORMAL HIGH (ref 70–99)
Glucose-Capillary: 131 mg/dL — ABNORMAL HIGH (ref 70–99)
Glucose-Capillary: 149 mg/dL — ABNORMAL HIGH (ref 70–99)

## 2021-06-07 LAB — HEMOGLOBIN AND HEMATOCRIT, BLOOD
HCT: 21.5 % — ABNORMAL LOW (ref 36.0–46.0)
Hemoglobin: 7.3 g/dL — ABNORMAL LOW (ref 12.0–15.0)

## 2021-06-07 LAB — PREPARE RBC (CROSSMATCH)

## 2021-06-07 MED ORDER — MIDODRINE HCL 5 MG PO TABS
5.0000 mg | ORAL_TABLET | Freq: Three times a day (TID) | ORAL | Status: DC
  Administered 2021-06-07 (×2): 5 mg via ORAL
  Filled 2021-06-07 (×2): qty 1

## 2021-06-07 MED ORDER — CIPROFLOXACIN IN D5W 400 MG/200ML IV SOLN
400.0000 mg | INTRAVENOUS | Status: DC
  Administered 2021-06-08: 400 mg via INTRAVENOUS
  Filled 2021-06-07: qty 200

## 2021-06-07 MED ORDER — ALPRAZOLAM 0.25 MG PO TABS: 0.2500 mg | ORAL_TABLET | Freq: Every evening | ORAL | Status: DC | PRN

## 2021-06-07 MED ORDER — LACTULOSE 10 GM/15ML PO SOLN
20.0000 g | Freq: Three times a day (TID) | ORAL | Status: DC
  Administered 2021-06-07: 20 g via ORAL
  Filled 2021-06-07 (×2): qty 30

## 2021-06-07 MED ORDER — SODIUM CHLORIDE 0.9 % IV BOLUS
500.0000 mL | Freq: Once | INTRAVENOUS | Status: AC
  Administered 2021-06-07: 500 mL via INTRAVENOUS

## 2021-06-07 MED ORDER — NOREPINEPHRINE 4 MG/250ML-% IV SOLN
2.0000 ug/min | INTRAVENOUS | Status: DC
  Administered 2021-06-07: 2 ug/min via INTRAVENOUS
  Administered 2021-06-08 (×2): 10 ug/min via INTRAVENOUS
  Filled 2021-06-07 (×3): qty 250

## 2021-06-07 MED ORDER — SODIUM CHLORIDE 0.9 % IR SOLN
3000.0000 mL | Status: DC
  Administered 2021-06-07 (×2): 3000 mL

## 2021-06-07 MED ORDER — SODIUM CHLORIDE 0.9 % IV BOLUS
250.0000 mL | Freq: Once | INTRAVENOUS | Status: AC
  Administered 2021-06-07: 250 mL via INTRAVENOUS

## 2021-06-07 MED ORDER — SODIUM CHLORIDE 0.9% IV SOLUTION: Freq: Once | INTRAVENOUS | Status: AC

## 2021-06-07 MED ORDER — NOREPINEPHRINE 4 MG/250ML-% IV SOLN: 0.0000 ug/min | INTRAVENOUS | Status: DC

## 2021-06-07 MED ORDER — SODIUM CHLORIDE 0.9 % IV SOLN
250.0000 mL | INTRAVENOUS | Status: DC
Start: 2021-06-07 — End: 2021-06-10
  Administered 2021-06-07: 250 mL via INTRAVENOUS

## 2021-06-07 MED ORDER — SODIUM CHLORIDE 0.9 % IV SOLN
2.0000 g | Freq: Two times a day (BID) | INTRAVENOUS | Status: DC
  Administered 2021-06-07 – 2021-06-08 (×2): 2 g via INTRAVENOUS
  Filled 2021-06-07 (×5): qty 2000

## 2021-06-07 NOTE — Progress Notes (Signed)
Patient arrived to ICU room 4 transferred from dept 300 d/t declining condtion, 61F foley catheter that she had when she arrived to the ICU was noted not draining well, bladder scan obtained & 432m volume noted, foley catheter flushed w/o success, assessed catheter placement & noted catheter wasn't advanced far enough & wasn't in the bladder, CBI ordered prior to arrival to ICU d/t cathter insertion trauma, 60F CBI catheter placed w/o difficulty w/bright red urine & blood clots noted, small amount of return < 2084mnoted, CBI started & required multiple flushes to dislodge blood clots in the catheter which were obstructing flow, flow noted normal at this time, unable to stop CBI to be able to determine urine output d/t blood clots continuing to obstruct catheter, family at bedside aware & very much involved with patient care ?

## 2021-06-07 NOTE — Progress Notes (Signed)
Patient continued to have soft BP's. Hgb 6.4, blood clots in foley. MD paged, received orders to transfuse, initiate CBI, and transfer patient to step down. Blood transfusion was started, CBI supplies ordered and patient was transferred to bed 4.  ?

## 2021-06-07 NOTE — Progress Notes (Addendum)
?Progress Note ? ? ?Patient: Brittany Russo ZDG:[id number] DOB: 1945/04/13 DOA: 06/04/2021     2 ?DOS: the patient was seen and examined on 06/07/2021 ?  ?Brief hospital course: ?Per HPI: ?Brittany Russo is a 76 y.o. Caucasian female with medical history significant for asthma, liver cirrhosis, type II obese mellitus, depression, hypertension, dyslipidemia, anxiety and anasarca, who presented to the emergency room with acute onset of urinary retention.  The patient's been having difficulty urinating for the last few hours before presentation to the ER.  She vomited once during the day.  She admitted to abdominal distention.  She stated that she has been compliant with her medications.  No fever or chills.  No chest pain or dyspnea or cough or wheezing.  She did not have any urine output yesterday.  She was noted to have bloody urine in the ER. ?  ?06/05/21: Patient was admitted for UTI with recent discharge for same on 3/9.  She was previously noted to have seizure activity due to benzodiazepine withdrawal, but benzodiazepines have been reinitiated here.  She is also noted to be hyponatremic and was initially started on IV normal saline which will be discontinued given her increased volume status.  Plan to push albumin to help redistribute fluids and maintain on bicarbonate as ordered.  Monitor repeat labs.  Lactic acidosis decreasing.  Follow urine cultures. ? ?06/06/21: Patient continues to have some persistent hematuria with worsening acute blood loss anemia which PRBC transfusion has been ordered.  She is also noted to have significant thrombocytopenia for which platelets have been ordered on account of her active bleeding status.  Vitamin K ordered for elevated INR.  She is otherwise asymptomatic and family members are at bedside.  Urology consulted for further evaluation of hematuria related to traumatic Foley insertion. ? ?3/18.  Received 1 more unit PRBC, fluid bolus, albumin for worsening renal function and  hypotension. ?Start rifaximin and lactulose for hepatic encephalopathy. ?Ordered a saline flush of bladder. ? ?Assessment and Plan: ? ?Urinary tract infection secondary to Hafnia Alvei and Enterococcus faecalis. ?Continue Cipro, add IV ampicillin for 3 days ? ?Acute kidney injury secondary to hepatorenal syndrome. ?Hyponatremia secondary to liver cirrhosis. ?Metabolic acidosis secondary to acute renal failure. ?Hypokalemia. ?Patient has worsening renal function due to hepatorenal syndrome.  Patient also has significant hematuria, potentially causing outlet obstruction. ?Patient has been given albumin as well as IV fluid bolus. ?Continue to follow BMP. ? ?Hypotension. ?Gross hematuria secondary to thrombocytopenia and catheter trauma. ?Acute blood loss anemia. ?Pancytopenia with chronic thrombocytopenia. ?Patient blood pressure is borderline low, receiving albumin and a fluid bolus. ?Patient has been followed by urology for hematuria. ?I spoke with the nurse, will change to a larger bore catheter and start saline flushing of the bladder. ?Patient will be given 1 unit PRBC for hemoglobin 6.4.  Recheck a CBC tomorrow. ? ?Hepatic encephalopathy. ?Alcoholic liver cirrhosis with ascites. ?Lactic acidosis. ?Right-sided pleural effusion. ?Appreciate GI consult.  Patient still has significant confusion, will increase lactulose to 20 g 3 times a day.  Continue rifaximin.   ?Lactic acidosis is due to liver failure. ?Reduce Xanax dose to 0.25 mg every evening as needed. ? ?Type 2 diabetes with peripheral neuropathy. ?Continue sliding scale insulin. ? ?Goal of care discussion. ?Had a long discussion with the patient sister, patient long-term prognosis is poor, acutely, patient has a high risk of mortality.  Sister still want patient to be full code. ?Due to severity of disease, patient be transferred to stepdown  unit to monitor closely. ? ?1230.  Met the patient daughter and husband again, decision is made for DO NOT  RESUSCITATE.  ?Patient will be treated for additional 24 hours, if condition deteriorates, patient can be transferred to comfort care.  ? Patient blood pressure still low, currently receiving the bolus and albumin.  I will also add midodrine ? ?  ? ?Subjective:  ?Patient has significant confusion, but no agitation. ?Still has significant gross hematuria.  Very little p.o. intake, very little urine output. ?Blood pressure is borderline low. ?Does not have short of breath or cough. ? ? ?Physical Exam: ?Vitals:  ? 06/07/21 0550 06/07/21 0601 06/07/21 0925 06/07/21 0954  ?BP: (!) 82/51 (!) 88/48 (!) 81/52 (!) 86/52  ?Pulse:   100 100  ?Resp:   20 20  ?Temp:   98.1 ?F (36.7 ?C) 98.1 ?F (36.7 ?C)  ?TempSrc:   Oral Oral  ?SpO2:   97% 97%  ?Weight:      ?Height:      ? ?General exam: Appears calm and comfortable  ?Respiratory system: Clear to auscultation. Respiratory effort normal. ?Cardiovascular system: S1 & S2 heard, RRR. No JVD, murmurs, rubs, gallops or clicks. ?Gastrointestinal system: Abdomen is distended, soft and nontender. No organomegaly or masses felt. Normal bowel sounds heard. ?Central nervous system: Drowsy and oriented x2. No focal neurological deficits. ?Extremities: right AKA, left 1+ edema ?Skin: No rashes, lesions or ulcers ? ? ?Data Reviewed: ? ?Reviewed CT abdomen/pelvis. ?Reviewed all lab results. ?Reviewed lab results from past 12 months. ? ?Family Communication: Sister updated as above. ? ?Disposition: ?Status is: Inpatient ?Remains inpatient appropriate because: Severity of disease, high risk for deterioration. ? Planned Discharge Destination:  To be determined ? ? ? ?Time spent: 50 minutes ? ?Author: Sharen Hones, MD ?06/07/2021 11:05 AM ? ?For on call review [email or web address].  ?

## 2021-06-08 DIAGNOSIS — N390 Urinary tract infection, site not specified: Secondary | ICD-10-CM | POA: Diagnosis not present

## 2021-06-08 LAB — CBC WITH DIFFERENTIAL/PLATELET
Abs Immature Granulocytes: 0.15 10*3/uL — ABNORMAL HIGH (ref 0.00–0.07)
Basophils Absolute: 0 10*3/uL (ref 0.0–0.1)
Basophils Relative: 0 %
Eosinophils Absolute: 0 10*3/uL (ref 0.0–0.5)
Eosinophils Relative: 0 %
HCT: 26.5 % — ABNORMAL LOW (ref 36.0–46.0)
Hemoglobin: 9 g/dL — ABNORMAL LOW (ref 12.0–15.0)
Immature Granulocytes: 2 %
Lymphocytes Relative: 7 %
Lymphs Abs: 0.6 10*3/uL — ABNORMAL LOW (ref 0.7–4.0)
MCH: 31.5 pg (ref 26.0–34.0)
MCHC: 34 g/dL (ref 30.0–36.0)
MCV: 92.7 fL (ref 80.0–100.0)
Monocytes Absolute: 1 10*3/uL (ref 0.1–1.0)
Monocytes Relative: 11 %
Neutro Abs: 7.1 10*3/uL (ref 1.7–7.7)
Neutrophils Relative %: 80 %
Platelets: 91 10*3/uL — ABNORMAL LOW (ref 150–400)
RBC: 2.86 MIL/uL — ABNORMAL LOW (ref 3.87–5.11)
RDW: 18.1 % — ABNORMAL HIGH (ref 11.5–15.5)
WBC: 9 10*3/uL (ref 4.0–10.5)
nRBC: 0.2 % (ref 0.0–0.2)

## 2021-06-08 LAB — COMPREHENSIVE METABOLIC PANEL
ALT: 29 U/L (ref 0–44)
AST: 51 U/L — ABNORMAL HIGH (ref 15–41)
Albumin: 4.8 g/dL (ref 3.5–5.0)
Alkaline Phosphatase: 50 U/L (ref 38–126)
Anion gap: 15 (ref 5–15)
BUN: 31 mg/dL — ABNORMAL HIGH (ref 8–23)
CO2: 14 mmol/L — ABNORMAL LOW (ref 22–32)
Calcium: 7.5 mg/dL — ABNORMAL LOW (ref 8.9–10.3)
Chloride: 93 mmol/L — ABNORMAL LOW (ref 98–111)
Creatinine, Ser: 2.85 mg/dL — ABNORMAL HIGH (ref 0.44–1.00)
GFR, Estimated: 17 mL/min — ABNORMAL LOW (ref >60)
Glucose, Bld: 172 mg/dL — ABNORMAL HIGH (ref 70–99)
Potassium: 4.5 mmol/L (ref 3.5–5.1)
Sodium: 122 mmol/L — ABNORMAL LOW (ref 135–145)
Total Bilirubin: 2.4 mg/dL — ABNORMAL HIGH (ref 0.3–1.2)
Total Protein: 6.3 g/dL — ABNORMAL LOW (ref 6.5–8.1)

## 2021-06-08 LAB — TYPE AND SCREEN
ABO/RH(D): A POS
Antibody Screen: NEGATIVE
Unit division: 0
Unit division: 0

## 2021-06-08 LAB — PREPARE PLATELET PHERESIS: Unit division: 0

## 2021-06-08 LAB — MAGNESIUM: Magnesium: 1.8 mg/dL (ref 1.7–2.4)

## 2021-06-08 LAB — BPAM RBC
Blood Product Expiration Date: 202303302359
Blood Product Expiration Date: 202304122359
ISSUE DATE / TIME: 202303170133
ISSUE DATE / TIME: 202303180934
Unit Type and Rh: 600
Unit Type and Rh: 6200

## 2021-06-08 LAB — BPAM PLATELET PHERESIS
Blood Product Expiration Date: 202303192359
ISSUE DATE / TIME: 202303171222
Unit Type and Rh: 6200

## 2021-06-08 LAB — GLUCOSE, CAPILLARY
Glucose-Capillary: 186 mg/dL — ABNORMAL HIGH (ref 70–99)
Glucose-Capillary: 165 mg/dL — ABNORMAL HIGH (ref 70–99)

## 2021-06-08 LAB — CORTISOL-AM, BLOOD: Cortisol - AM: 13.2 ug/dL (ref 6.7–22.6)

## 2021-06-08 MED ORDER — GLYCOPYRROLATE 0.2 MG/ML IJ SOLN: 0.2000 mg | INTRAMUSCULAR | Status: DC | PRN

## 2021-06-08 MED ORDER — MORPHINE SULFATE (PF) 2 MG/ML IV SOLN
1.0000 mg | INTRAVENOUS | Status: DC | PRN
  Administered 2021-06-08: 1 mg via INTRAVENOUS
  Filled 2021-06-08: qty 1

## 2021-06-08 MED ORDER — ACETAMINOPHEN 325 MG PO TABS: 650.0000 mg | ORAL_TABLET | Freq: Four times a day (QID) | ORAL | Status: DC | PRN

## 2021-06-08 MED ORDER — GLYCOPYRROLATE 0.2 MG/ML IJ SOLN
0.2000 mg | INTRAMUSCULAR | Status: DC | PRN
  Administered 2021-06-09 – 2021-06-10 (×2): 0.2 mg via INTRAVENOUS
  Filled 2021-06-08 (×2): qty 1

## 2021-06-08 MED ORDER — ACETAMINOPHEN 650 MG RE SUPP: 650.0000 mg | Freq: Four times a day (QID) | RECTAL | Status: DC | PRN

## 2021-06-08 MED ORDER — LORAZEPAM 2 MG/ML PO CONC: 1.0000 mg | ORAL | Status: DC | PRN

## 2021-06-08 MED ORDER — LORAZEPAM 1 MG PO TABS: 1.0000 mg | ORAL_TABLET | ORAL | Status: DC | PRN

## 2021-06-08 MED ORDER — GLYCOPYRROLATE 1 MG PO TABS: 1.0000 mg | ORAL_TABLET | ORAL | Status: DC | PRN

## 2021-06-08 MED ORDER — LORAZEPAM 2 MG/ML IJ SOLN: 1.0000 mg | INTRAMUSCULAR | Status: DC | PRN

## 2021-06-08 NOTE — Progress Notes (Signed)
?PROGRESS NOTE ? ? ? ?Brittany Russo  XHB:[id number] DOB: May 10, 1945 DOA: 06/04/2021 ?PCP: Rosalee Kaufman, PA-C ? ? ?Brief Narrative:  ?Per HPI: ?Brittany Russo is a 76 y.o. Caucasian female with medical history significant for asthma, liver cirrhosis, type II obese mellitus, depression, hypertension, dyslipidemia, anxiety and anasarca, who presented to the emergency room with acute onset of urinary retention.  The patient's been having difficulty urinating for the last few hours before presentation to the ER.  She vomited once during the day.  She admitted to abdominal distention.  She stated that she has been compliant with her medications.  No fever or chills.  No chest pain or dyspnea or cough or wheezing.  She did not have any urine output yesterday.  She was noted to have bloody urine in the ER. ?  ?06/05/21: Patient was admitted for UTI with recent discharge for same on 3/9.  She was previously noted to have seizure activity due to benzodiazepine withdrawal, but benzodiazepines have been reinitiated here.  She is also noted to be hyponatremic and was initially started on IV normal saline which will be discontinued given her increased volume status.  Plan to push albumin to help redistribute fluids and maintain on bicarbonate as ordered.  Monitor repeat labs.  Lactic acidosis decreasing.  Follow urine cultures. ? ?06/06/21: Patient continues to have some persistent hematuria with worsening acute blood loss anemia which PRBC transfusion has been ordered.  She is also noted to have significant thrombocytopenia for which platelets have been ordered on account of her active bleeding status.  Vitamin K ordered for elevated INR.  She is otherwise asymptomatic and family members are at bedside.  Urology consulted for further evaluation of hematuria related to traumatic Foley insertion. ? ?3/18.  Received 1 more unit PRBC, fluid bolus, albumin for worsening renal function and hypotension. ?Start rifaximin and  lactulose for hepatic encephalopathy. ?Ordered a saline flush of bladder. ? ?06/08/21: Patient noted to start on norepinephrine for blood pressure support overnight.  Hyponatremia currently worsening and she appears to be developing hepatorenal syndrome.  She currently remains lethargic and family members have visited at bedside and are agreeable to transition to comfort care.  ? ? ?Assessment & Plan: ?  ?Active Problems: ?  Acute lower UTI ?  Hyponatremia ?  Lactic acidosis ?  Anxiety and depression ?  Type 2 diabetes mellitus with peripheral neuropathy (HCC) ?  Essential hypertension ?  Alcoholic cirrhosis of liver with ascites ([place]) ? ?Assessment and Plan: ? ? ?Urinary tract infection secondary to Hafnia Alvei and Enterococcus faecalis. ?  ?Acute kidney injury secondary to hepatorenal syndrome. ?Hyponatremia secondary to liver cirrhosis. ?Metabolic acidosis secondary to acute renal failure. ?Hypokalemia. ? ?  ?Hypotension. ?Gross hematuria secondary to thrombocytopenia and catheter trauma. ?Acute blood loss anemia. ?Pancytopenia with chronic thrombocytopenia. ? ?  ?Hepatic encephalopathy. ?Alcoholic liver cirrhosis with ascites. ?Lactic acidosis. ?Right-sided pleural effusion. ? ?  ?Type 2 diabetes with peripheral neuropathy. ?  ?Goal of care discussion. ?  ?Patient will now transition to comfort care after discussion with family members on 3/19.  Anticipate in-hospital death. ? ? ?DVT prophylaxis: None ?Code Status: DNR/comfort care ?Family Communication: Multiple family members at bedside 3/19 ?Disposition Plan:  ?Status is: Inpatient ?Remains inpatient appropriate because: IV medications for comfort ?Skin Assessment: ? ?I have examined the patient?s skin and I agree with the wound assessment as performed by the wound care RN as outlined below: ? ?Pressure Injury 05/29/21 Buttocks Left;Upper;Medial Stage 1 -  Intact skin with non-blanchable redness of a localized area usually over a bony prominence. 4cm x 3cm  (Active)  ?05/29/21 0830  ?Location: Buttocks  ?Location Orientation: Left;Upper;Medial  ?Staging: Stage 1 -  Intact skin with non-blanchable redness of a localized area usually over a bony prominence.  ?Wound Description (Comments): 4cm x 3cm  ?Present on Admission: No  ?   ?Pressure Injury 06/07/21 Buttocks Mid;Bilateral Stage 2 -  Partial thickness loss of dermis presenting as a shallow open injury with a red, pink wound bed without slough. Red, dry, open peeled back skin (Active)  ?06/07/21 1108  ?Location: Buttocks  ?Location Orientation: Mid;Bilateral  ?Staging: Stage 2 -  Partial thickness loss of dermis presenting as a shallow open injury with a red, pink wound bed without slough.  ?Wound Description (Comments): Red, dry, open peeled back skin  ?Present on Admission: Yes  ? ? ?Consultants:  ?Urology ?Palliative ? ?Procedures:  ?See below ? ?Antimicrobials:  ?Anti-infectives (From admission, onward)  ? ? Start     Dose/Rate Route Frequency Ordered Stop  ? 06/08/21 0548  ciprofloxacin (CIPRO) IVPB 400 mg       ? 400 mg ?200 mL/hr over 60 Minutes Intravenous Every 24 hours 06/07/21 1014    ? 06/07/21 1230  ampicillin (OMNIPEN) 2 g in sodium chloride 0.9 % 100 mL IVPB  Status:  Discontinued       ? 2 g ?300 mL/hr over 20 Minutes Intravenous Every 12 hours 06/07/21 1140 06/08/21 1232  ? 06/06/21 1800  ciprofloxacin (CIPRO) IVPB 400 mg  Status:  Discontinued       ? 400 mg ?200 mL/hr over 60 Minutes Intravenous Every 12 hours 06/06/21 1541 06/07/21 1014  ? 06/05/21 2200  cefTRIAXone (ROCEPHIN) 1 g in sodium chloride 0.9 % 100 mL IVPB  Status:  Discontinued       ? 1 g ?200 mL/hr over 30 Minutes Intravenous Every 24 hours 06/05/21 0430 06/05/21 1016  ? 06/05/21 2200  cefTRIAXone (ROCEPHIN) 2 g in sodium chloride 0.9 % 100 mL IVPB  Status:  Discontinued       ? 2 g ?200 mL/hr over 30 Minutes Intravenous Every 24 hours 06/05/21 1016 06/06/21 1541  ? 06/05/21 1000  rifaximin (XIFAXAN) tablet 550 mg  Status:   Discontinued       ? 550 mg Oral 2 times daily 06/05/21 0341 06/08/21 1232  ? 06/05/21 0145  ciprofloxacin (CIPRO) IVPB 400 mg       ? 400 mg ?200 mL/hr over 60 Minutes Intravenous  Once 06/05/21 0141 06/05/21 0309  ? ?  ? ? ?Subjective: ?Patient seen and evaluated today and appears much more lethargic.  No acute overnight events noted.  Multiple family members at bedside.  Started on norepinephrine overnight for soft blood pressure readings. ? ?Objective: ?Vitals:  ? 06/08/21 1200 06/08/21 1300 06/08/21 1330 06/08/21 1400  ?BP: (!) 88/47 (!) 93/46 95/72   ?Pulse: (!) 46 (!) 45 77 (!) 46  ?Resp: 13 (!) 9 13 13   ?Temp:      ?TempSrc:      ?SpO2: 98% 97% (!) 89% 96%  ?Weight:      ?Height:      ? ? ?Intake/Output Summary (Last 24 hours) at 06/08/2021 1410 ?Last data filed at 06/08/2021 1409 ?Gross per 24 hour  ?Intake 5279.48 ml  ?Output 8250 ml  ?Net -2970.52 ml  ? ?Filed Weights  ? 06/04/21 2119 06/05/21 1128  ?Weight: 50.6 kg 58.4 kg  ? ? ?  Examination: ? ?General exam: Appears lethargic ?Respiratory system: Clear to auscultation. Respiratory effort normal.  On nasal cannula ?Cardiovascular system: S1 & S2 heard, RRR.  ?Gastrointestinal system: Abdomen is soft ?Central nervous system: Alert and awake ?Extremities: No edema ?Skin: No significant lesions noted ?Psychiatry: Flat affect. ? ? ? ?Data Reviewed: I have personally reviewed following labs and imaging studies ? ?CBC: ?Recent Labs  ?Lab 06/04/21 ?2219 06/05/21 ?0430 06/05/21 ?0758 06/05/21 ?2303 06/06/21 ?0809 06/07/21 ?0530 06/07/21 ?1743 06/08/21 ?0900  ?WBC 5.6 4.6  --   --  1.5* 2.2*  --  9.0  ?NEUTROABS 3.9  --   --   --   --   --   --  7.1  ?HGB 9.7* 9.0*   < > 6.4* 7.2* 6.4* 7.3* 9.0*  ?HCT 29.9* 27.8*   < > 19.3* 21.5* 19.5* 21.5* 26.5*  ?MCV 100.3* 101.8*  --   --  96.8 97.0  --  92.7  ?PLT 79* 61*  --   --  29* 38*  --  91*  ? < > = values in this interval not displayed.  ? ?Basic Metabolic Panel: ?Recent Labs  ?Lab 06/04/21 ?2219 06/05/21 ?0430  06/06/21 ?0809 06/07/21 ?0530 06/08/21 ?0427  ?NA 125* 124* 127* 125* 122*  ?K 4.3 4.3 3.1* 3.8 4.5  ?CL 101 103 95* 93* 93*  ?CO2 11* 11* 21* 18* 14*  ?GLUCOSE 102* 103* 108* 134* 172*  ?BUN 32* 30* 27* 28* 31*  ?CREATINI

## 2021-06-09 DIAGNOSIS — E871 Hypo-osmolality and hyponatremia: Secondary | ICD-10-CM | POA: Diagnosis not present

## 2021-06-09 DIAGNOSIS — E1142 Type 2 diabetes mellitus with diabetic polyneuropathy: Secondary | ICD-10-CM

## 2021-06-09 DIAGNOSIS — K767 Hepatorenal syndrome: Secondary | ICD-10-CM

## 2021-06-09 DIAGNOSIS — F419 Anxiety disorder, unspecified: Secondary | ICD-10-CM | POA: Diagnosis not present

## 2021-06-09 DIAGNOSIS — N390 Urinary tract infection, site not specified: Secondary | ICD-10-CM | POA: Diagnosis not present

## 2021-06-09 DIAGNOSIS — K7031 Alcoholic cirrhosis of liver with ascites: Secondary | ICD-10-CM | POA: Diagnosis not present

## 2021-06-09 DIAGNOSIS — Z515 Encounter for palliative care: Secondary | ICD-10-CM | POA: Diagnosis not present

## 2021-06-09 MED ORDER — BIOTENE DRY MOUTH MT LIQD: 15.0000 mL | OROMUCOSAL | Status: DC | PRN

## 2021-06-09 MED ORDER — POLYVINYL ALCOHOL 1.4 % OP SOLN: 1.0000 [drp] | Freq: Four times a day (QID) | OPHTHALMIC | Status: DC | PRN

## 2021-06-09 MED ORDER — MORPHINE SULFATE (PF) 2 MG/ML IV SOLN
1.0000 mg | INTRAVENOUS | Status: DC | PRN
  Administered 2021-06-09 (×3): 1 mg via INTRAVENOUS
  Filled 2021-06-09 (×3): qty 1

## 2021-06-09 NOTE — Progress Notes (Signed)
Pt resting quietly with eyes closed, opens eyes to verbal stimulation, occasional short verbal responses. Family at bedside, requesting pain medication for discomfort. Morphine 1 mg IV given, pt denying pain at this time. Pt has decreased, irregular respirations, wheezing noted upon auscultation to chest. Dependent edema worsening to upper extremities and left leg. Skin warm and pink.  ?

## 2021-06-09 NOTE — Progress Notes (Signed)
Palliative: ?Brittany Russo is resting quietly in bed.  She appears acutely/chronically ill and frail.  She will briefly open her eyes, but not make eye contact.  She is surrounded by her loving family.  They have elected comfort care. ? ?Present today her daughters Brittany Russo and Brittany Russo along with Brittany Russo sister and a niece.  We talked about symptom management.  We also talk about prognosis with permission. ? ?Conference with attending, bedside nursing staff, transition of care team related to patient condition, needs, goals of care, disposition. ?End-of-life order set implemented, symptom management needs addressed. ? ?Plan: Full comfort care.  Anticipate hours, in hospital death. ? ?50 minute  ?Brittany Axe, NP ?Palliative medicine team ?Team phone [phone] ?Greater than 50% of this time was spent counseling and coordinating care related to the above assessment and plan. ?

## 2021-06-09 NOTE — Progress Notes (Signed)
?PROGRESS NOTE ? ? ? ?Brittany Russo  XBD:[id number] DOB: May 10, 1945 DOA: 06/04/2021 ?PCP: Rosalee Kaufman, PA-C ? ? ?Brief Narrative:  ?Per HPI: ?Brittany Russo is a 76 y.o. Caucasian female with medical history significant for asthma, liver cirrhosis, type II obese mellitus, depression, hypertension, dyslipidemia, anxiety and anasarca, who presented to the emergency room with acute onset of urinary retention.  The patient's been having difficulty urinating for the last few hours before presentation to the ER.  She vomited once during the day.  She admitted to abdominal distention.  She stated that she has been compliant with her medications.  No fever or chills.  No chest pain or dyspnea or cough or wheezing.  She did not have any urine output yesterday.  She was noted to have bloody urine in the ER. ?  ?06/05/21: Patient was admitted for UTI with recent discharge for same on 3/9.  She was previously noted to have seizure activity due to benzodiazepine withdrawal, but benzodiazepines have been reinitiated here.  She is also noted to be hyponatremic and was initially started on IV normal saline which will be discontinued given her increased volume status.  Plan to push albumin to help redistribute fluids and maintain on bicarbonate as ordered.  Monitor repeat labs.  Lactic acidosis decreasing.  Follow urine cultures. ? ?06/06/21: Patient continues to have some persistent hematuria with worsening acute blood loss anemia which PRBC transfusion has been ordered.  She is also noted to have significant thrombocytopenia for which platelets have been ordered on account of her active bleeding status.  Vitamin K ordered for elevated INR.  She is otherwise asymptomatic and family members are at bedside.  Urology consulted for further evaluation of hematuria related to traumatic Foley insertion. ? ?3/18.  Received 1 more unit PRBC, fluid bolus, albumin for worsening renal function and hypotension. ?Start rifaximin and  lactulose for hepatic encephalopathy. ?Ordered a saline flush of bladder. ? ?06/08/21: Patient noted to start on norepinephrine for blood pressure support overnight.  Hyponatremia currently worsening and she appears to be developing hepatorenal syndrome.  She currently remains lethargic and family members have visited at bedside and are agreeable to transition to comfort care.  ? ?3/20: Patient has been transition to full comfort care.  Appears in no major distress currently.  Appreciate assistance and recommendation by palliative care service.  Continue symptomatic management.  Life expectancy hours to days. ? ?Assessment & Plan: ?  ?Active Problems: ?  Acute lower UTI ?  Hyponatremia ?  Lactic acidosis ?  Anxiety and depression ?  Type 2 diabetes mellitus with peripheral neuropathy (HCC) ?  Essential hypertension ?  Alcoholic cirrhosis of liver with ascites ([place]) ? ?Assessment and Plan: ? ? ?Urinary tract infection secondary to Hafnia Alvei and Enterococcus faecalis. ?  ?Acute kidney injury secondary to hepatorenal syndrome. ?Hyponatremia secondary to liver cirrhosis. ?Metabolic acidosis secondary to acute renal failure. ?Hypokalemia. ? ?  ?Hypotension. ?Gross hematuria secondary to thrombocytopenia and catheter trauma. ?Acute blood loss anemia. ?Pancytopenia with chronic thrombocytopenia. ? ?  ?Hepatic encephalopathy. ?Alcoholic liver cirrhosis with ascites. ?Lactic acidosis. ?Right-sided pleural effusion. ? ?  ?Type 2 diabetes with peripheral neuropathy. ?  ?Goal of care discussion:  ?Patient fully transition to comfort care; no further labs or interventions are planned. Will focus on symptomatic management. Appreciate palliative care recommendations and assistance. Anticipating in-hospital death.  Life expectancy hours to days. ? ? ?DVT prophylaxis: None ?Code Status: DNR/comfort care ?Family Communication: Multiple family members at bedside  3/20 ?Disposition Plan:  ?Status is: Inpatient ?Remains inpatient  appropriate because: IV medications for comfort ?Skin Assessment: ? ?I have examined the patient?s skin and I agree with the wound assessment as performed by the wound care RN as outlined below: ? ?Pressure Injury 05/29/21 Buttocks Left;Upper;Medial Stage 1 -  Intact skin with non-blanchable redness of a localized area usually over a bony prominence. 4cm x 3cm (Active)  ?05/29/21 0830  ?Location: Buttocks  ?Location Orientation: Left;Upper;Medial  ?Staging: Stage 1 -  Intact skin with non-blanchable redness of a localized area usually over a bony prominence.  ?Wound Description (Comments): 4cm x 3cm  ?Present on Admission: No  ?   ?Pressure Injury 06/07/21 Buttocks Mid;Bilateral Stage 2 -  Partial thickness loss of dermis presenting as a shallow open injury with a red, pink wound bed without slough. Red, dry, open peeled back skin (Active)  ?06/07/21 1108  ?Location: Buttocks  ?Location Orientation: Mid;Bilateral  ?Staging: Stage 2 -  Partial thickness loss of dermis presenting as a shallow open injury with a red, pink wound bed without slough.  ?Wound Description (Comments): Red, dry, open peeled back skin  ?Present on Admission: Yes  ? ? ?Consultants:  ?Urology ?Palliative ? ?Procedures:  ?See below ? ?Antimicrobials:  ?Anti-infectives (From admission, onward)  ? ? Start     Dose/Rate Route Frequency Ordered Stop  ? 06/08/21 0548  ciprofloxacin (CIPRO) IVPB 400 mg  Status:  Discontinued       ? 400 mg ?200 mL/hr over 60 Minutes Intravenous Every 24 hours 06/07/21 1014 06/08/21 1849  ? 06/07/21 1230  ampicillin (OMNIPEN) 2 g in sodium chloride 0.9 % 100 mL IVPB  Status:  Discontinued       ? 2 g ?300 mL/hr over 20 Minutes Intravenous Every 12 hours 06/07/21 1140 06/08/21 1232  ? 06/06/21 1800  ciprofloxacin (CIPRO) IVPB 400 mg  Status:  Discontinued       ? 400 mg ?200 mL/hr over 60 Minutes Intravenous Every 12 hours 06/06/21 1541 06/07/21 1014  ? 06/05/21 2200  cefTRIAXone (ROCEPHIN) 1 g in sodium chloride 0.9 %  100 mL IVPB  Status:  Discontinued       ? 1 g ?200 mL/hr over 30 Minutes Intravenous Every 24 hours 06/05/21 0430 06/05/21 1016  ? 06/05/21 2200  cefTRIAXone (ROCEPHIN) 2 g in sodium chloride 0.9 % 100 mL IVPB  Status:  Discontinued       ? 2 g ?200 mL/hr over 30 Minutes Intravenous Every 24 hours 06/05/21 1016 06/06/21 1541  ? 06/05/21 1000  rifaximin (XIFAXAN) tablet 550 mg  Status:  Discontinued       ? 550 mg Oral 2 times daily 06/05/21 0341 06/08/21 1232  ? 06/05/21 0145  ciprofloxacin (CIPRO) IVPB 400 mg       ? 400 mg ?200 mL/hr over 60 Minutes Intravenous  Once 06/05/21 0141 06/05/21 0309  ? ?  ? ? ?Subjective: ?Patient remains lethargic, appears to be comfortable and in no major distress.  Afebrile.  Soft blood pressures on her vital signs appreciated. ? ?Objective: ?Vitals:  ? 06/08/21 1835 06/08/21 2219 06/09/21 0529 06/09/21 1400  ?BP: (!) 77/47 (!) 84/59 (!) 86/55 (!) 82/42  ?Pulse: (!) 51 95 98 74  ?Resp: (!) 6  16 17   ?Temp:  98.1 ?F (36.7 ?C) 98.5 ?F (36.9 ?C) 98.2 ?F (36.8 ?C)  ?TempSrc:  Oral Oral Oral  ?SpO2: 96% 99% 97% 97%  ?Weight:      ?Height:      ? ? ?  Intake/Output Summary (Last 24 hours) at 06/09/2021 1926 ?Last data filed at 06/09/2021 1739 ?Gross per 24 hour  ?Intake 0 ml  ?Output 200 ml  ?Net -200 ml  ? ?Filed Weights  ? 06/04/21 2119 06/05/21 1128  ?Weight: 50.6 kg 58.4 kg  ? ? ?Examination: ?General exam: Afebrile, in no acute distress.  Resting comfortable and quietly on exam.  Patient opens eyes to verbal stimulation and is intermittently answering short verbal responses.  No nausea, no vomiting, no fever. ?Respiratory system: Normal respiratory effort, no using accessory muscles.  No requiring oxygen supplementation. ?Cardiovascular system:RRR. No rubs or gallops. ?Gastrointestinal system: Abdomen is soft, no guarding, positive bowel sounds. ?Central nervous system: No focal neurological deficits. ?Extremities: No cyanosis or clubbing. ?Skin: No petechiae.  Stage II pressure injury  appreciated in her buttocks bilaterally; no signs of superimposed infection.  Present at time of admission. ?Psychiatry: Flat affect. ? ?Data Reviewed: I have personally reviewed following labs and imaging studi

## 2021-06-10 ENCOUNTER — Ambulatory Visit: Payer: Medicare Other | Admitting: Neurology

## 2021-06-10 ENCOUNTER — Inpatient Hospital Stay (HOSPITAL_COMMUNITY)
Admission: RE | Admit: 2021-06-10 | Discharge: 2021-06-21 | DRG: 951 | Disposition: E | Source: Hospice | Attending: Family Medicine | Admitting: Family Medicine

## 2021-06-10 DIAGNOSIS — K7682 Hepatic encephalopathy: Secondary | ICD-10-CM | POA: Diagnosis present

## 2021-06-10 DIAGNOSIS — I959 Hypotension, unspecified: Secondary | ICD-10-CM | POA: Diagnosis present

## 2021-06-10 DIAGNOSIS — E669 Obesity, unspecified: Secondary | ICD-10-CM | POA: Diagnosis present

## 2021-06-10 DIAGNOSIS — Z91048 Other nonmedicinal substance allergy status: Secondary | ICD-10-CM

## 2021-06-10 DIAGNOSIS — Z96651 Presence of right artificial knee joint: Secondary | ICD-10-CM | POA: Diagnosis present

## 2021-06-10 DIAGNOSIS — R630 Anorexia: Secondary | ICD-10-CM | POA: Diagnosis not present

## 2021-06-10 DIAGNOSIS — F419 Anxiety disorder, unspecified: Secondary | ICD-10-CM | POA: Diagnosis present

## 2021-06-10 DIAGNOSIS — Z6823 Body mass index (BMI) 23.0-23.9, adult: Secondary | ICD-10-CM

## 2021-06-10 DIAGNOSIS — R338 Other retention of urine: Secondary | ICD-10-CM | POA: Diagnosis not present

## 2021-06-10 DIAGNOSIS — Z961 Presence of intraocular lens: Secondary | ICD-10-CM | POA: Diagnosis present

## 2021-06-10 DIAGNOSIS — E871 Hypo-osmolality and hyponatremia: Secondary | ICD-10-CM | POA: Diagnosis present

## 2021-06-10 DIAGNOSIS — K767 Hepatorenal syndrome: Secondary | ICD-10-CM | POA: Diagnosis not present

## 2021-06-10 DIAGNOSIS — B952 Enterococcus as the cause of diseases classified elsewhere: Secondary | ICD-10-CM | POA: Diagnosis present

## 2021-06-10 DIAGNOSIS — E8809 Other disorders of plasma-protein metabolism, not elsewhere classified: Secondary | ICD-10-CM | POA: Diagnosis present

## 2021-06-10 DIAGNOSIS — E876 Hypokalemia: Secondary | ICD-10-CM | POA: Diagnosis present

## 2021-06-10 DIAGNOSIS — M1990 Unspecified osteoarthritis, unspecified site: Secondary | ICD-10-CM | POA: Diagnosis not present

## 2021-06-10 DIAGNOSIS — Z515 Encounter for palliative care: Secondary | ICD-10-CM | POA: Diagnosis not present

## 2021-06-10 DIAGNOSIS — Z886 Allergy status to analgesic agent status: Secondary | ICD-10-CM

## 2021-06-10 DIAGNOSIS — E872 Acidosis, unspecified: Secondary | ICD-10-CM | POA: Diagnosis not present

## 2021-06-10 DIAGNOSIS — K7460 Unspecified cirrhosis of liver: Secondary | ICD-10-CM | POA: Diagnosis not present

## 2021-06-10 DIAGNOSIS — N179 Acute kidney failure, unspecified: Secondary | ICD-10-CM | POA: Diagnosis present

## 2021-06-10 DIAGNOSIS — R1084 Generalized abdominal pain: Secondary | ICD-10-CM | POA: Diagnosis not present

## 2021-06-10 DIAGNOSIS — F32A Depression, unspecified: Secondary | ICD-10-CM | POA: Diagnosis present

## 2021-06-10 DIAGNOSIS — Z9841 Cataract extraction status, right eye: Secondary | ICD-10-CM

## 2021-06-10 DIAGNOSIS — D62 Acute posthemorrhagic anemia: Secondary | ICD-10-CM | POA: Diagnosis present

## 2021-06-10 DIAGNOSIS — Z66 Do not resuscitate: Secondary | ICD-10-CM | POA: Diagnosis present

## 2021-06-10 DIAGNOSIS — G4089 Other seizures: Secondary | ICD-10-CM | POA: Diagnosis not present

## 2021-06-10 DIAGNOSIS — J90 Pleural effusion, not elsewhere classified: Secondary | ICD-10-CM | POA: Diagnosis present

## 2021-06-10 DIAGNOSIS — E785 Hyperlipidemia, unspecified: Secondary | ICD-10-CM | POA: Diagnosis present

## 2021-06-10 DIAGNOSIS — I10 Essential (primary) hypertension: Secondary | ICD-10-CM | POA: Diagnosis present

## 2021-06-10 DIAGNOSIS — J45909 Unspecified asthma, uncomplicated: Secondary | ICD-10-CM | POA: Diagnosis present

## 2021-06-10 DIAGNOSIS — Z89611 Acquired absence of right leg above knee: Secondary | ICD-10-CM

## 2021-06-10 DIAGNOSIS — R1310 Dysphagia, unspecified: Secondary | ICD-10-CM | POA: Diagnosis not present

## 2021-06-10 DIAGNOSIS — R627 Adult failure to thrive: Secondary | ICD-10-CM | POA: Diagnosis present

## 2021-06-10 DIAGNOSIS — E118 Type 2 diabetes mellitus with unspecified complications: Secondary | ICD-10-CM | POA: Diagnosis not present

## 2021-06-10 DIAGNOSIS — Z90710 Acquired absence of both cervix and uterus: Secondary | ICD-10-CM

## 2021-06-10 DIAGNOSIS — E1142 Type 2 diabetes mellitus with diabetic polyneuropathy: Secondary | ICD-10-CM | POA: Diagnosis present

## 2021-06-10 DIAGNOSIS — Z794 Long term (current) use of insulin: Secondary | ICD-10-CM

## 2021-06-10 DIAGNOSIS — E1165 Type 2 diabetes mellitus with hyperglycemia: Secondary | ICD-10-CM

## 2021-06-10 DIAGNOSIS — D61818 Other pancytopenia: Secondary | ICD-10-CM | POA: Diagnosis present

## 2021-06-10 DIAGNOSIS — R531 Weakness: Secondary | ICD-10-CM | POA: Diagnosis not present

## 2021-06-10 DIAGNOSIS — K7031 Alcoholic cirrhosis of liver with ascites: Secondary | ICD-10-CM | POA: Diagnosis present

## 2021-06-10 DIAGNOSIS — N390 Urinary tract infection, site not specified: Secondary | ICD-10-CM | POA: Diagnosis not present

## 2021-06-10 DIAGNOSIS — Z79899 Other long term (current) drug therapy: Secondary | ICD-10-CM

## 2021-06-10 DIAGNOSIS — Z9842 Cataract extraction status, left eye: Secondary | ICD-10-CM

## 2021-06-10 DIAGNOSIS — Z833 Family history of diabetes mellitus: Secondary | ICD-10-CM

## 2021-06-10 MED ORDER — LORAZEPAM 1 MG PO TABS
1.0000 mg | ORAL_TABLET | ORAL | Status: DC | PRN
Start: 2021-06-10 — End: 2021-06-12

## 2021-06-10 MED ORDER — POLYVINYL ALCOHOL 1.4 % OP SOLN: 1.0000 [drp] | Freq: Four times a day (QID) | OPHTHALMIC | Status: DC | PRN

## 2021-06-10 MED ORDER — GLYCOPYRROLATE 0.2 MG/ML IJ SOLN
0.2000 mg | INTRAMUSCULAR | Status: DC | PRN
  Administered 2021-06-10 – 2021-06-12 (×4): 0.2 mg via INTRAVENOUS
  Filled 2021-06-10 (×4): qty 1

## 2021-06-10 MED ORDER — LORAZEPAM 2 MG/ML IJ SOLN: 1.0000 mg | INTRAMUSCULAR | Status: DC | PRN

## 2021-06-10 MED ORDER — LORAZEPAM 2 MG/ML PO CONC
1.0000 mg | ORAL | Status: DC | PRN
Start: 2021-06-10 — End: 2021-06-12
  Filled 2021-06-10: qty 0.5

## 2021-06-10 MED ORDER — MORPHINE SULFATE (PF) 2 MG/ML IV SOLN
2.0000 mg | INTRAVENOUS | Status: DC | PRN
  Administered 2021-06-10 (×2): 2 mg via INTRAVENOUS
  Filled 2021-06-10 (×2): qty 1

## 2021-06-10 MED ORDER — BIOTENE DRY MOUTH MT LIQD: 15.0000 mL | OROMUCOSAL | Status: DC | PRN

## 2021-06-10 MED ORDER — ACETAMINOPHEN 650 MG RE SUPP: 650.0000 mg | Freq: Four times a day (QID) | RECTAL | Status: DC | PRN

## 2021-06-10 MED ORDER — SODIUM CHLORIDE 0.9 % IR SOLN
3000.0000 mL | Status: DC
Start: 2021-06-10 — End: 2021-06-10

## 2021-06-10 MED ORDER — ONDANSETRON HCL 4 MG/2ML IJ SOLN: 4.0000 mg | Freq: Four times a day (QID) | INTRAMUSCULAR | Status: DC | PRN

## 2021-06-10 MED ORDER — MORPHINE SULFATE (PF) 2 MG/ML IV SOLN
2.0000 mg | INTRAVENOUS | Status: DC | PRN
  Administered 2021-06-10 – 2021-06-11 (×7): 2 mg via INTRAVENOUS
  Filled 2021-06-10 (×8): qty 1

## 2021-06-10 MED ORDER — ACETAMINOPHEN 325 MG PO TABS: 650.0000 mg | ORAL_TABLET | Freq: Four times a day (QID) | ORAL | Status: DC | PRN

## 2021-06-10 MED ORDER — ALPRAZOLAM 0.25 MG PO TABS: 0.2500 mg | ORAL_TABLET | Freq: Every evening | ORAL | Status: DC | PRN

## 2021-06-10 MED ORDER — GLYCOPYRROLATE 0.2 MG/ML IJ SOLN: 0.2000 mg | INTRAMUSCULAR | Status: DC | PRN

## 2021-06-10 MED ORDER — GLYCOPYRROLATE 1 MG PO TABS: 1.0000 mg | ORAL_TABLET | ORAL | Status: DC | PRN

## 2021-06-10 NOTE — Plan of Care (Signed)
  Problem: Pain Managment: Goal: General experience of comfort will improve Outcome: Not Progressing   

## 2021-06-10 NOTE — Discharge Summary (Signed)
?Physician Discharge Summary ?  ?Patient: Brittany Russo MRN: [id number] DOB: 11/03/1945  ?Admit date:     06/04/2021  ?Discharge date: 06/20/2021  ?Discharge Physician: Barton Dubois  ? ?PCP: Rosalee Kaufman, PA-C  ? ?Recommendations at discharge:  ?Patient has been discharged to inpatient hospice for further symptomatic management and end-of-life care. ? ?Discharge Diagnoses: ?Active Problems: ?  Acute lower UTI ?  Hyponatremia ?  Lactic acidosis ?  Anxiety and depression ?  Type 2 diabetes mellitus with peripheral neuropathy (HCC) ?  Essential hypertension ?  Alcoholic cirrhosis of liver with ascites ([place]) ? ?Hospital Course: ?As per H&P written by Dr. Denton Brick on 05/26/2021 ?Brittany Russo is a 76 y.o. female with medical history significant for  Liver Cirrhosis, DM, Depression, HTN, Seizures.  ?Patient was brought to the ED from home with reports of change in mental status.  At time of my evaluation patient was status post lactulose and a bowel movement and per spouse patient has improved.  History is obtained from patient and husband at bedside. ?  ?Patient's husband reports confusion and visual hallucinations started yesterday evening.  He also reports increased sleepiness.  Patient has been compliant with lactulose. Having 3-4 bowel movements every day.  Yesterday she had up to 7 bowel movements.  Spouse reports 2 weeks ago for about- 3 days patient despite patient's compliance with her lactulose, she did not have any bowel movements.  Mental status remained intact and subsequently started having bowel movements.  ?  ?Patient was also diagnosed with UTI, and completed a 10-day course of Macrobid 10 days ago.  Today she denies dysuria or nocturia; respiratory frequency..  No fevers no chills.  No vomiting.  . ?  ?ED Course: Temperature 98.  Heart rate 50s.  Respiratory rate 14-21.  Blood pressure systolic 403- 474.  Ammonia elevated at 119.  Sodium 129.  Platelets 66.  UA with large leukocytes and few  bacteria.  ED CT head without contrast demonstrating no acute intracranial abnormalities. ?Oral lactulose given. 548ms n/s bolus given.  2 g IV ceftriaxone given. ? ?Assessment and Plan: ?  ?Urinary tract infection secondary to Hafnia Alvei and Enterococcus faecalis. ?  ?Acute kidney injury secondary to hepatorenal syndrome. ?Hyponatremia secondary to liver cirrhosis. ?Metabolic acidosis secondary to acute renal failure. ?Hypokalemia. ?  ?  ?Hypotension. ?Gross hematuria secondary to thrombocytopenia and catheter trauma. ?Acute blood loss anemia. ?Pancytopenia with chronic thrombocytopenia. ?  ?  ?Hepatic encephalopathy. ?Alcoholic liver cirrhosis with ascites. ?Lactic acidosis. ?Right-sided pleural effusion. ?  ?  ?Type 2 diabetes with peripheral neuropathy. ?  ?Goal of care discussion:  ?Patient fully transition to comfort care; no further labs or interventions are planned. Will focus on symptomatic management. Appreciate palliative care recommendations and assistance. Anticipating in-hospital death.  Life expectancy hours to days. ?  ?Consultants: GI service and palliative care. ?Procedures performed: See below for x-ray reports. ?Disposition: GIP; full comfort care and symptomatic management only. ?Diet recommendation: Comfort feeding. ? ?DISCHARGE MEDICATION: ?Allergies as of 06/16/2021   ? ?   Reactions  ? Asa [aspirin] Other (See Comments)  ? Pt not allergic but cannot take due to bleeding   ? Other Other (See Comments)  ? Any jewelry metal-hives and itching   ? ?  ? ?  ?Medication List  ?  ? ?ASK your doctor about these medications   ? ?acetaminophen 325 MG tablet ?Commonly known as: TYLENOL ?Take 1-2 tablets (325-650 mg total) by mouth every 4 (four) hours  as needed for mild pain. ?  ?ALPRAZolam 0.25 MG tablet ?Commonly known as: Duanne Moron ?Take 1 tablet (0.25 mg total) by mouth 2 (two) times daily. ?  ?atenolol 25 MG tablet ?Commonly known as: TENORMIN ?Take 1 tablet (25 mg total) by mouth daily. ?   ?atorvastatin 80 MG tablet ?Commonly known as: LIPITOR ?Take 80 mg by mouth daily at 8 pm. ?  ?buPROPion 150 MG 24 hr tablet ?Commonly known as: WELLBUTRIN XL ?Take 150 mg by mouth in the morning. ?  ?escitalopram 20 MG tablet ?Commonly known as: LEXAPRO ?Take 20 mg by mouth in the morning. ?  ?ferrous sulfate 325 (65 FE) MG tablet ?Commonly known as: FerrouSul ?Take 1 tablet (325 mg total) by mouth 2 (two) times daily with a meal. ?  ?fluticasone 50 MCG/ACT nasal spray ?Commonly known as: FLONASE ?Place 1 spray into both nostrils in the morning. ?  ?furosemide 20 MG tablet ?Commonly known as: LASIX ?Take 1 tablet (20 mg total) by mouth daily. ?  ?gabapentin 300 MG capsule ?Commonly known as: NEURONTIN ?Take 1 capsule (300 mg total) by mouth at bedtime. ?  ?insulin glargine 100 UNIT/ML injection ?Commonly known as: LANTUS ?5-10 Units daily. Dose per daughter is 5-10 in morning depending on blood sugar level ?  ?lactulose 10 GM/15ML solution ?Commonly known as: Morton ?Take 15 mLs (10 g total) by mouth 3 (three) times daily. ?  ?mesalamine 1000 MG suppository ?Commonly known as: CANASA ?Place 1 suppository rectally at bedtime. ?  ?multivitamin with minerals Tabs tablet ?Take 1 tablet by mouth daily. ?  ?NovoLOG FlexPen 100 UNIT/ML FlexPen ?Generic drug: insulin aspart ?Inject 3-12 Units into the skin in the morning and at bedtime. Can be up to 8 units twice a day depending on blood sugar. Take mid afternoon only if bloodsugar  is over 180  Takes in evening if it is 200 or over give up to 10 units per daughter Sliding scale ?  ?ondansetron 4 MG disintegrating tablet ?Commonly known as: ZOFRAN-ODT ?Take 4 mg by mouth every 8 (eight) hours. ?  ?pantoprazole 40 MG tablet ?Commonly known as: PROTONIX ?Take 1 tablet (40 mg total) by mouth daily. ?  ?rifaximin 550 MG Tabs tablet ?Commonly known as: XIFAXAN ?Take 1 tablet (550 mg total) by mouth 2 (two) times daily. ?  ?sitaGLIPtin-metformin 50-500 MG tablet ?Commonly  known as: JANUMET ?Take 1 tablet by mouth 2 (two) times daily with a meal. ?  ?spironolactone 100 MG tablet ?Commonly known as: ALDACTONE ?Take 0.5 tablets (50 mg total) by mouth in the morning. ?  ?traZODone 50 MG tablet ?Commonly known as: DESYREL ?Take 50 mg by mouth at bedtime as needed for sleep. ?  ? ?  ? ? ?Discharge Exam: ?Filed Weights  ? 06/04/21 2119 06/05/21 1128  ?Weight: 50.6 kg 58.4 kg  ? ?General exam: Afebrile, in no acute distress.  Resting comfortable and quietly on exam.  Patient opens eyes to verbal stimulation and is intermittently answering short verbal responses.  No nausea, no vomiting, no fever. ?Respiratory system: Normal respiratory effort, no using accessory muscles.  No requiring oxygen supplementation. ?Cardiovascular system:RRR. No rubs or gallops. ?Gastrointestinal system: Abdomen is soft, no guarding, positive bowel sounds. ?Central nervous system: No focal neurological deficits. ?Extremities: No cyanosis or clubbing. ?Skin: No petechiae.  Stage II pressure injury appreciated in her buttocks bilaterally; no signs of superimposed infection.  Present at time of admission. ?Psychiatry: Flat affect. ? ?The results of significant diagnostics from this hospitalization (including  imaging, microbiology, ancillary and laboratory) are listed below for reference.  ? ?Imaging Studies: ?DG Abd 1 View ? ?Result Date: 06/07/2021 ?CLINICAL DATA:  Urinary retention EXAM: ABDOMEN - 1 VIEW COMPARISON:  01/12/2020 FINDINGS: Normal abdominal gas pattern. No gross free intraperitoneal gas. No organomegaly. Osseous structures are age-appropriate. IMPRESSION: Normal abdominal gas pattern. Electronically Signed   By: Fidela Salisbury M.D.   On: 06/07/2021 00:22  ? ?CT HEAD WO CONTRAST (5MM) ? ?Result Date: 05/28/2021 ?CLINICAL DATA:  Seizure, new onset, no history of trauma. Mental status change. EXAM: CT HEAD WITHOUT CONTRAST TECHNIQUE: Contiguous axial images were obtained from the base of the skull through  the vertex without intravenous contrast. RADIATION DOSE REDUCTION: This exam was performed according to the departmental dose-optimization program which includes automated exposure control, adjustment of the mA and/or kV ac

## 2021-06-10 NOTE — TOC Progression Note (Signed)
Transition of Care (TOC) - Progression Note  ? ? ?Patient Details  ?Name: Brittany Russo ?MRN: [id number] ?Date of Birth: 06/17/1945 ? ?Transition of Care (TOC) CM/SW Contact  ?Khamiyah Grefe D, LCSW ?Phone Number: ?06/18/2021, 2:23 PM ? ?Clinical Narrative:    ?Patient is not GIP with [facility]. ? ? ?  ?  ? ?Expected Discharge Plan and Services ?  ?  ?  ?  ?  ?                ?  ?  ?  ?  ?  ?  ?  ?  ?  ?  ? ? ?Social Determinants of Health (SDOH) Interventions ?  ? ?Readmission Risk Interventions ?No flowsheet data found. ? ?

## 2021-06-10 NOTE — H&P (Signed)
?History and Physical  ? ? ?Patient: Brittany Russo UTM:[id number] DOB: 1946-03-21 ?DOA: 05/31/2021 ?DOS: the patient was seen and examined on 06/20/2021 ?PCP: Rosalee Kaufman, PA-C  ?Patient coming from: GIP ? ?Chief Complaint: End-of-life care; patient with hepatorenal syndrome, UTI, hypertension and hepatic encephalopathy. ? ?HPI:  ?Brittany Russo is a 76 y.o. Caucasian female with medical history significant for asthma, liver cirrhosis, type II obese mellitus, depression, hypertension, dyslipidemia, anxiety and anasarca, who presented to the emergency room with acute onset of urinary retention.  The patient's been having difficulty urinating for the last few hours before presentation to the ER.  She vomited once during the day.  She admitted to abdominal distention.  She stated that she has been compliant with her medications.  No fever or chills.  No chest pain or dyspnea or cough or wheezing.  She did not have any urine output yesterday.  She was noted to have bloody urine in the ER. ?  ?06/05/21: Patient was admitted for UTI with recent discharge for same on 3/9.  She was previously noted to have seizure activity due to benzodiazepine withdrawal, but benzodiazepines have been reinitiated here.  She is also noted to be hyponatremic and was initially started on IV normal saline which will be discontinued given her increased volume status.  Plan to push albumin to help redistribute fluids and maintain on bicarbonate as ordered.  Monitor repeat labs.  Lactic acidosis decreasing.  Follow urine cultures. ?  ?06/06/21: Patient continues to have some persistent hematuria with worsening acute blood loss anemia which PRBC transfusion has been ordered.  She is also noted to have significant thrombocytopenia for which platelets have been ordered on account of her active bleeding status.  Vitamin K ordered for elevated INR.  She is otherwise asymptomatic and family members are at bedside.  Urology consulted for further  evaluation of hematuria related to traumatic Foley insertion. ?  ?3/18.  Received 1 more unit PRBC, fluid bolus, albumin for worsening renal function and hypotension. ?Start rifaximin and lactulose for hepatic encephalopathy. ?Ordered a saline flush of bladder. ?  ?06/08/21: Patient noted to start on norepinephrine for blood pressure support overnight.  Hyponatremia currently worsening and she appears to be developing hepatorenal syndrome.  She currently remains lethargic and family members have visited at bedside and are agreeable to transition to comfort care.  ?  ?3/20: Patient has been transition to full comfort care.  Appears in no major distress currently.  Appreciate assistance and recommendation by palliative care service.  Continue symptomatic management.  Life expectancy hours to days. ? ?05/23/2021: Overall comfortable and in no major distress.  Slightly more lethargic/somnolent and less interactive with family members at bedside.  Continue receiving medication management for symptomatic care and full comfort. ? ?Review of Systems: As mentioned in the history of present illness. All other systems reviewed and are negative. ?Past Medical History:  ?Diagnosis Date  ? Acute and subacute hepatic failure without coma   ? Allergy   ? Anasarca   ? Anxiety   ? Arthritis   ? Ascites 09/2018  ? no SBP on 850 cc tap 10/13/18  ? Asthma   ? allery induced  ? Cirrhosis of liver ([place]) ~ 2004  ? from NAFLD.  Dx ~ 2004.  Dr Dorcas Mcmurray at [facility]/    ? Depression   ? Diabetes mellitus without complication ([place])   ? type 2  ? Eczema of both hands   ? Endometrial cancer ([place]) 1996,  2003  ? hysterectomy 1996.  recurrence 2003, treated with radiation.    ? Generalized edema   ? GI hemorrhage   ? Hematochezia 2009  ? 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 in [place]  ? Hyperlipidemia   ? Hypertension   ? Muscle weakness   ? Seizures ([place])   ? last seizure June 07, 2014   ? Thrombocytopenia  ([place])   ? Thyroid nodule   ? ?Past Surgical History:  ?Procedure Laterality Date  ? AMPUTATION Right 06/29/2019  ? Procedure: AMPUTATION ABOVE KNEE through knee;  Surgeon: Paralee Cancel, MD;  Location: [facility];  Service: Orthopedics;  Laterality: Right;  2 hrs  ? CATARACT EXTRACTION W/PHACO Left 06/04/2017  ? Procedure: CATARACT EXTRACTION PHACO AND INTRAOCULAR LENS PLACEMENT (IOC);  Surgeon: Baruch Goldmann, MD;  Location: [facility];  Service: Ophthalmology;  Laterality: Left;  CDE: 3.90  ? CATARACT EXTRACTION W/PHACO Right 07/15/2017  ? Procedure: CATARACT EXTRACTION PHACO AND INTRAOCULAR LENS PLACEMENT (IOC);  Surgeon: Baruch Goldmann, MD;  Location: [facility];  Service: Ophthalmology;  Laterality: Right;  CDE: 7.60  ? COLONOSCOPY  06/2007  ? in [place]. for hematochzia.  radiation proctitis  ? DILATION AND CURETTAGE OF UTERUS    ? ESOPHAGOGASTRODUODENOSCOPY  2016  ? ESOPHAGOGASTRODUODENOSCOPY (EGD) WITH PROPOFOL N/A 12/30/2018  ? Procedure: EGD;  Surgeon: Clarene Essex, MD;  [facility];  Service: Endoscopy;  Laterality: N/A;  ? EXCISIONAL TOTAL KNEE ARTHROPLASTY Right 01/03/2019  ? Procedure: EXCISIONAL TOTAL KNEE ARTHROPLASTY;  Surgeon: Paralee Cancel, MD;  [facility]: [facility];  Service: Orthopedics;  Laterality: Right;  ? FLEXIBLE SIGMOIDOSCOPY N/A 12/30/2018  ? Procedure: FLEXIBLE SIGMOIDOSCOPY;  Surgeon: Clarene Essex, MD;  [facility];  Service: Endoscopy;  Laterality: N/A;  ? IR PARACENTESIS  10/13/2018  ? IR PARACENTESIS  07/21/2019  ? IR THORACENTESIS ASP PLEURAL SPACE W/IMG GUIDE  07/19/2019  ? STERIOD INJECTION Left 06/29/2019  ? Procedure: LEFT KNEE INJECTION;  Surgeon: Paralee Cancel, MD;  [facility];  Service: Orthopedics;  Laterality: Left;  ? TOTAL ABDOMINAL HYSTERECTOMY  1996  ? TOTAL KNEE ARTHROPLASTY Right 09/22/2018  ? Procedure: TOTAL KNEE ARTHROPLASTY;  Surgeon: Paralee Cancel, MD;  [facility];  Service: Orthopedics;  Laterality: Right;  70 mins  ? TOTAL KNEE REVISION Right 09/27/2018  ?  Procedure: TOTAL KNEE REVISION;  Surgeon: Paralee Cancel, MD;  [facility];  Service: Orthopedics;  Laterality: Right;  ? TOTAL KNEE REVISION Right 11/22/2018  ? Procedure: repair right knee extensor mechanism;  Surgeon: Paralee Cancel, MD;  Location: [facility];  Service: Orthopedics;  Laterality: Right;  90 mins  ? ?Social History:  reports that she has never smoked. She has never used smokeless tobacco. She reports that she does not drink alcohol and does not use drugs. ? ?Allergies  ?Allergen Reactions  ? Asa [Aspirin] Other (See Comments)  ?  Pt not allergic but cannot take due to bleeding   ? Other Other (See Comments)  ?  Any jewelry metal-hives and itching   ? ? ?Family History  ?Problem Relation Age of Onset  ? Heart disease Mother   ? Diabetes Mother   ? Heart disease Father   ? Cancer Father   ? Cancer Maternal Grandmother   ? Heart disease Maternal Grandfather   ? ? ?Prior to Admission medications   ?Medication Sig Start Date End Date Taking? Authorizing Provider  ?acetaminophen (TYLENOL) 325 MG tablet Take 1-2 tablets (325-650 mg total) by mouth  every 4 (four) hours as needed for mild pain. 07/13/19   Bary Leriche, PA-C  ?ALPRAZolam (XANAX) 0.25 MG tablet Take 1 tablet (0.25 mg total) by mouth 2 (two) times daily. 05/29/21   Roxan Hockey, MD  ?atenolol (TENORMIN) 25 MG tablet Take 1 tablet (25 mg total) by mouth daily. ?Patient taking differently: Take 50 mg by mouth daily. 10/18/20   Roxan Hockey, MD  ?atorvastatin (LIPITOR) 80 MG tablet Take 80 mg by mouth daily at 8 pm.    [provider]  ?buPROPion (WELLBUTRIN XL) 150 MG 24 hr tablet Take 150 mg by mouth in the morning.     [provider]  ?escitalopram (LEXAPRO) 20 MG tablet Take 20 mg by mouth in the morning.  05/17/19   [provider]  ?ferrous sulfate (FERROUSUL) 325 (65 FE) MG tablet Take 1 tablet (325 mg total) by mouth 2 (two) times daily with a meal. 10/18/20 06/05/21  Emokpae, Courage, MD  ?fluticasone  (FLONASE) 50 MCG/ACT nasal spray Place 1 spray into both nostrils in the morning.    [provider]  ?furosemide (LASIX) 20 MG tablet Take 1 tablet (20 mg total) by mouth daily. ?Patient taking differently:

## 2021-06-10 NOTE — Progress Notes (Signed)
Palliative: ?Mrs. Barhorst is resting quietly in bed.  She does not respond in any meaningful way to voice or touch.  Mrs. Wallie Char is clearly unable to make her basic needs known.  She is surrounded by her loving family.   ? ?Family has elected comfort and dignity at end-of-life, let nature take its course.  Daughters [place] and [place] and I talk in the hallway about prognosis, what is normal and expected.  We talk about some signs and symptoms of end-of-life.  We talked about symptom management needs. ? ?Conference with attending, bedside nursing staff, transition of care team related to patient condition, needs, goals of care, disposition. ? ?Plan:   Comfort and dignity at end-of-life, GIP/inpatient hospice referral. ? ?35 minutes ?[name], NP ?Palliative medicine team ?Team phone [phone] ?Greater than 50% of this time was spent counseling and coordinating care related to the above assessment and plan. ?

## 2021-06-11 DIAGNOSIS — R627 Adult failure to thrive: Secondary | ICD-10-CM

## 2021-06-11 MED ORDER — MORPHINE SULFATE (PF) 2 MG/ML IV SOLN: 2.0000 mg | INTRAVENOUS | Status: DC | PRN

## 2021-06-11 MED ORDER — MORPHINE SULFATE (PF) 2 MG/ML IV SOLN
2.0000 mg | INTRAVENOUS | Status: DC | PRN
  Administered 2021-06-12: 2 mg via INTRAVENOUS
  Filled 2021-06-11: qty 1

## 2021-06-11 NOTE — Progress Notes (Signed)
Brief Summary:- ?76 y.o. female with medical history significant for  Liver Cirrhosis, DM, Depression, HTN, Seizures admitted on 06/05/21 with acute urinary retention/UTI, subsequently developed persistent hematuria post catheterization, also found to have persistent lactic acidosis and hyponatremia in the setting of liver cirrhosis  with ascites ?-Required transfusion of PRBCs and pressors for BP support ?-Transition to full comfort care on 06/09/2021 ?-Accepted to hospice and transition to GIP status on 06/05/2021 ? ? ?Urinary tract infection secondary to Hafnia Alvei and Enterococcus faecalis. ?-Treated initially ?= Patient is now full comfort care under hospice care ?  ?Acute kidney injury secondary to hepatorenal syndrome. ?Hyponatremia secondary to liver cirrhosis. ?Metabolic acidosis secondary to acute renal failure. ?Hypokalemia. ?Gross hematuria secondary to thrombocytopenia and catheter trauma with Acute blood loss anemia-requiring transfusion ?Pancytopenia with chronic thrombocytopenia. ? Hepatic encephalopathy. ?Alcoholic liver cirrhosis with ascites. ?Lactic acidosis. ?Right-sided pleural effusion. ? Type 2 diabetes with peripheral neuropathy. ? ?-= Patient is now full comfort care under hospice care ? ?-Discussed with patient's daughters [name] and Deliah Goody ?at bedside as well as patient's husband [name] ?- ?IV morphine sulfate adjusted for comfort ?- ?Appears comfortable ?- ?Conference with family numbers at bedside as noted above ?-Anticipate in-hospital death ?- ?Roxan Hockey, MD ? ?

## 2021-06-11 NOTE — Progress Notes (Signed)
Palliative: ?Family has elected comfort and dignity at end-of-life, let nature take its course.  Brittany Russo has been accepted into residential hospice at [facility], she is now GIP status here. ? ?Conference with attending, bedside nursing staff, transition of care team related to patient condition, needs, goals of care, disposition. ?DNR/goldenrod form completed and placed on chart. ? ?Plan: Comfort and dignity at end-of-life, requesting residential hospice with [facility]. ?Comfort care/end-of-life order set implemented. ? ?No charge ?[name], NP ?Palliative medicine team ?Team phone [phone] ?Greater than 50% of this time was spent counseling and coordinating care related to the above assessment and plan. ?

## 2021-06-21 NOTE — Death Summary Note (Signed)
? ?DEATH SUMMARY  ? ?Patient Details  ?Name: Brittany Russo ?MRN: [id number] ?DOB: 06-25-1945 ?WEX:[id number], Aundra Millet, PA-C ?Admission/Discharge Information  ? ?Admit Date:  2021/06/23  ?Date of Death: Date of Death: 2021-06-25  ?Time of Death: Time of Death: 36  ?Length of Stay: 2  ? ?Principle Cause of death: Hepatorenal syndrome ? ?Hospital Diagnoses: ?Principal Problem: ?  Hepatorenal syndrome ([place]) ?Active Problems: ?  Failure to thrive in adult ?  Liver Cirrhosis  ?  Enterococcus and Hafnia UTI  ?  Controlled type 2 diabetes mellitus with hyperglycemia, with long-term current use of insulin ([place]) ?  Hyponatremia ?  Hypoalbuminemia ?  Alcoholic cirrhosis of liver with ascites ([place]) ? ? ?Hospital Course: ?Brief Summary:- ?76 y.o. female with medical history significant for  Liver Cirrhosis, DM, Depression, HTN, Seizures admitted on 06/05/21 with acute urinary retention/UTI, subsequently developed persistent hematuria post catheterization, also found to have persistent lactic acidosis and hyponatremia in the setting of liver cirrhosis  with ascites ?-Required transfusion of PRBCs and pressors for BP support ?-Transition to full comfort care on 06/09/2021 ?-Accepted to hospice and transition to GIP status on 06-23-2021 ?  ? ?Assessment and Plan: ? ?Brief Summary:- ?76 y.o. female with medical history significant for  Liver Cirrhosis, DM, Depression, HTN, Seizures admitted on 06/05/21 with acute urinary retention/UTI, subsequently developed persistent hematuria post catheterization, also found to have persistent lactic acidosis and hyponatremia in the setting of liver cirrhosis  with ascites ?-Required transfusion of PRBCs and pressors for BP support ?-Transition to full comfort care on 06/09/2021 ?-Accepted to hospice and transition to GIP status on June 23, 2021 ?  ?  ?Urinary tract infection secondary to Hafnia Alvei and Enterococcus faecalis. ?-Treated initially with antibiotics ?= Patient was transitioned to full  comfort care under hospice care ?  ?Acute kidney injury secondary to hepatorenal syndrome. ? ? ?Hyponatremia secondary to liver cirrhosis. ?Metabolic acidosis secondary to acute renal failure. ?Hypokalemia. ?Gross hematuria secondary to thrombocytopenia and catheter trauma with Acute blood loss anemia-requiring transfusion ?Pancytopenia with chronic thrombocytopenia. ? Hepatic encephalopathy. ?Alcoholic liver cirrhosis with ascites. ?Lactic acidosis. ?Right-sided pleural effusion. ? Type 2 diabetes with peripheral neuropathy ? ?Social/Ethics-- Patient was transitioned to full comfort care under hospice care ?-Patient passed away on June 25, 2021 at 10:10 AM with family at bedside ? ?Consultations: Palliative care/hospice ? ?Significant Diagnostic Studies: ?DG Abd 1 View ? ?Result Date: 06/07/2021 ?CLINICAL DATA:  Urinary retention EXAM: ABDOMEN - 1 VIEW COMPARISON:  01/12/2020 FINDINGS: Normal abdominal gas pattern. No gross free intraperitoneal gas. No organomegaly. Osseous structures are age-appropriate. IMPRESSION: Normal abdominal gas pattern. Electronically Signed   By: Fidela Salisbury M.D.   On: 06/07/2021 00:22  ? ?CT HEAD WO CONTRAST (5MM) ? ?Result Date: 05/28/2021 ?CLINICAL DATA:  Seizure, new onset, no history of trauma. Mental status change. EXAM: CT HEAD WITHOUT CONTRAST TECHNIQUE: Contiguous axial images were obtained from the base of the skull through the vertex without intravenous contrast. RADIATION DOSE REDUCTION: This exam was performed according to the departmental dose-optimization program which includes automated exposure control, adjustment of the mA and/or kV according to patient size and/or use of iterative reconstruction technique. COMPARISON:  05/26/2021 FINDINGS: Brain: No visible change. Brain atrophy with some frontal lobe predominance. Mild chronic small-vessel change of the white matter. No sign of acute infarction, mass lesion, hemorrhage, hydrocephalus or extra-axial collection. Vascular:  There is atherosclerotic calcification of the major vessels at the base of the brain. Skull: Negative Sinuses/Orbits: Fluid levels in the paranasal sinuses consistent  with rhinosinusitis. Orbits negative. Other: None IMPRESSION: No acute intracranial CT finding. Atrophy with frontal lobe predominance. Mild chronic small-vessel change of the hemispheric white matter. Fluid levels of the paranasal sinuses consistent with rhinosinusitis. Electronically Signed   By: Nelson Chimes M.D.   On: 05/28/2021 10:58  ? ?CT Head Wo Contrast ? ?Result Date: 05/26/2021 ?CLINICAL DATA:  Memory loss. EXAM: CT HEAD WITHOUT CONTRAST TECHNIQUE: Contiguous axial images were obtained from the base of the skull through the vertex without intravenous contrast. RADIATION DOSE REDUCTION: This exam was performed according to the departmental dose-optimization program which includes automated exposure control, adjustment of the mA and/or kV according to patient size and/or use of iterative reconstruction technique. COMPARISON:  Prior head CT examinations 10/16/2020 and earlier. FINDINGS: Brain: Frontal lobe predominant cerebral atrophy. Mild ill-defined hypoattenuation within the cerebral white matter, nonspecific but compatible chronic small vessel ischemic disease. There is no acute intracranial hemorrhage. No demarcated cortical infarct. No extra-axial fluid collection. No evidence of an intracranial mass. No midline shift. Vascular: No hyperdense vessel.  Atherosclerotic calcifications. Skull: Normal. Negative for fracture or focal lesion. Sinuses/Orbits: Visualized orbits show no acute finding. Moderate scattered fluid within the right ethmoid air cells. Trace mucosal thickening within the left ethmoid air cells. Large fluid level within the right sphenoid sinus. Frothy secretions and moderate fluid level within the left sphenoid sinus. Small-to-moderate fluid levels within the maxillary sinuses at the imaged levels. IMPRESSION: No evidence  of acute intracranial abnormality. Frontal lobe predominant cerebral atrophy. Mild chronic small vessel ischemic changes within the cerebral white matter. Paranasal sinus disease at the imaged levels, as described. Correlate for acute sinusitis. Electronically Signed   By: Kellie Simmering D.O.   On: 05/26/2021 16:20  ? ?MR BRAIN WO CONTRAST ? ?Result Date: 05/28/2021 ?CLINICAL DATA:  Seizure, new-onset, no history of trauma EXAM: MRI HEAD WITHOUT CONTRAST TECHNIQUE: Multiplanar, multiecho pulse sequences of the brain and surrounding structures were obtained without intravenous contrast. COMPARISON:  CT head from the same day. FINDINGS: Motion limited study. Brain: No acute infarction, hemorrhage, hydrocephalus, extra-axial collection or mass lesion. Frontal predominant atrophy. Mild T2/FLAIR hyperintensities the white matter, nonspecific but compatible with chronic microvascular disease that is very mild for age. The hippocampi appear symmetric in size and signal. . Vascular: Major arterial flow voids are maintained at the skull base. Skull and upper cervical spine: Normal marrow signal. Sinuses/Orbits: Scattered opacified ethmoid air cells with air-fluid levels in bilateral maxillary sinuses and sphenoid sinuses. Other: Small bilateral mastoid effusions. IMPRESSION: 1. No evidence of acute abnormality on this motion limited study. 2. Frontal predominant cerebral atrophy. 3. Paranasal sinus disease with air-fluid levels, detailed above. Electronically Signed   By: Margaretha Sheffield M.D.   On: 05/28/2021 17:42  ? ?CT Abdomen Pelvis W Contrast ? ?Result Date: 06/05/2021 ?CLINICAL DATA:  Nausea and vomiting with acute nonlocalized abdominal pain. Difficulty urinating. EXAM: CT ABDOMEN AND PELVIS WITH CONTRAST TECHNIQUE: Multidetector CT imaging of the abdomen and pelvis was performed using the standard protocol following bolus administration of intravenous contrast. RADIATION DOSE REDUCTION: This exam was performed according  to the departmental dose-optimization program which includes automated exposure control, adjustment of the mA and/or kV according to patient size and/or use of iterative reconstruction technique. CONTRAST:

## 2021-06-21 NOTE — Discharge Summary (Signed)
?  Brief Summary:- ?76 y.o. female with medical history significant for  Liver Cirrhosis, DM, Depression, HTN, Seizures admitted on 06/05/21 with acute urinary retention/UTI, subsequently developed persistent hematuria post catheterization, also found to have persistent lactic acidosis and hyponatremia in the setting of liver cirrhosis  with ascites ?-Required transfusion of PRBCs and pressors for BP support ?-Transition to full comfort care on 06/09/2021 ?-Accepted to hospice and transition to GIP status on 06/07/2021 ? ? ? ?Patient was transitioned to full comfort care under hospice care ?-Patient passed away on 2021-07-04 at 10:10 AM with family at bedside ? ? ?-Please see dictated death summary for same date of service ? ?Roxan Hockey, MD ? ?

## 2021-06-21 DEATH — deceased
